# Patient Record
Sex: Female | Born: 1945 | Race: Black or African American | Hispanic: No | Marital: Single | Smoking: Never smoker
Health system: Southern US, Community
[De-identification: ages and names within clinical notes are randomized; demographics above are authoritative.]

## PROBLEM LIST (undated history)

## (undated) DIAGNOSIS — J449 Chronic obstructive pulmonary disease, unspecified: Secondary | ICD-10-CM

## (undated) DIAGNOSIS — B20 Human immunodeficiency virus [HIV] disease: Secondary | ICD-10-CM

## (undated) DIAGNOSIS — N3000 Acute cystitis without hematuria: Secondary | ICD-10-CM

## (undated) DIAGNOSIS — C801 Malignant (primary) neoplasm, unspecified: Secondary | ICD-10-CM

## (undated) DIAGNOSIS — E785 Hyperlipidemia, unspecified: Secondary | ICD-10-CM

## (undated) DIAGNOSIS — D735 Infarction of spleen: Secondary | ICD-10-CM

## (undated) DIAGNOSIS — Z9889 Other specified postprocedural states: Secondary | ICD-10-CM

## (undated) DIAGNOSIS — N289 Disorder of kidney and ureter, unspecified: Secondary | ICD-10-CM

## (undated) DIAGNOSIS — I639 Cerebral infarction, unspecified: Secondary | ICD-10-CM

## (undated) DIAGNOSIS — R87610 Atypical squamous cells of undetermined significance on cytologic smear of cervix (ASC-US): Secondary | ICD-10-CM

## (undated) DIAGNOSIS — Z923 Personal history of irradiation: Secondary | ICD-10-CM

## (undated) DIAGNOSIS — C3490 Malignant neoplasm of unspecified part of unspecified bronchus or lung: Secondary | ICD-10-CM

## (undated) DIAGNOSIS — B1910 Unspecified viral hepatitis B without hepatic coma: Secondary | ICD-10-CM

## (undated) DIAGNOSIS — G709 Myoneural disorder, unspecified: Secondary | ICD-10-CM

## (undated) DIAGNOSIS — J45909 Unspecified asthma, uncomplicated: Secondary | ICD-10-CM

## (undated) HISTORY — PX: CRANIOTOMY: SHX93

## (undated) HISTORY — PX: BRAIN SURGERY: SHX531

## (undated) HISTORY — PX: ABCESS DRAINAGE: SHX399

## (undated) HISTORY — DX: Atypical squamous cells of undetermined significance on cytologic smear of cervix (ASC-US): R87.610

## (undated) HISTORY — PX: OTHER SURGICAL HISTORY: SHX169

## (undated) HISTORY — DX: Malignant neoplasm of unspecified part of unspecified bronchus or lung: C34.90

---

## 2003-02-08 ENCOUNTER — Encounter: Admission: RE | Admit: 2003-02-08 | Discharge: 2003-02-08 | Payer: Self-pay | Admitting: Infectious Diseases

## 2003-02-08 ENCOUNTER — Ambulatory Visit (HOSPITAL_COMMUNITY): Admission: RE | Admit: 2003-02-08 | Discharge: 2003-02-08 | Payer: Self-pay | Admitting: Infectious Diseases

## 2003-02-08 ENCOUNTER — Encounter: Payer: Self-pay | Admitting: Infectious Diseases

## 2003-03-02 ENCOUNTER — Encounter: Admission: RE | Admit: 2003-03-02 | Discharge: 2003-03-02 | Payer: Self-pay | Admitting: Infectious Diseases

## 2003-06-25 ENCOUNTER — Emergency Department (HOSPITAL_COMMUNITY): Admission: EM | Admit: 2003-06-25 | Discharge: 2003-06-25 | Payer: Self-pay | Admitting: Emergency Medicine

## 2004-06-26 ENCOUNTER — Emergency Department (HOSPITAL_COMMUNITY): Admission: EM | Admit: 2004-06-26 | Discharge: 2004-06-26 | Payer: Self-pay | Admitting: Family Medicine

## 2004-07-16 ENCOUNTER — Ambulatory Visit: Payer: Self-pay | Admitting: Internal Medicine

## 2004-11-25 ENCOUNTER — Ambulatory Visit: Payer: Self-pay | Admitting: Obstetrics & Gynecology

## 2004-11-25 ENCOUNTER — Other Ambulatory Visit: Admission: RE | Admit: 2004-11-25 | Discharge: 2004-11-25 | Payer: Self-pay | Admitting: Obstetrics & Gynecology

## 2004-12-22 ENCOUNTER — Ambulatory Visit: Payer: Self-pay | Admitting: Internal Medicine

## 2005-02-10 ENCOUNTER — Ambulatory Visit (HOSPITAL_COMMUNITY): Admission: RE | Admit: 2005-02-10 | Discharge: 2005-02-10 | Payer: Self-pay | Admitting: Internal Medicine

## 2005-03-17 ENCOUNTER — Other Ambulatory Visit: Admission: RE | Admit: 2005-03-17 | Discharge: 2005-03-17 | Payer: Self-pay | Admitting: Gynecology

## 2005-03-17 ENCOUNTER — Encounter (INDEPENDENT_AMBULATORY_CARE_PROVIDER_SITE_OTHER): Payer: Self-pay | Admitting: Specialist

## 2005-03-17 ENCOUNTER — Ambulatory Visit: Admission: RE | Admit: 2005-03-17 | Discharge: 2005-03-17 | Payer: Self-pay | Admitting: Gynecologic Oncology

## 2005-03-18 ENCOUNTER — Ambulatory Visit: Payer: Self-pay | Admitting: Internal Medicine

## 2005-04-15 ENCOUNTER — Ambulatory Visit (HOSPITAL_COMMUNITY): Admission: RE | Admit: 2005-04-15 | Discharge: 2005-04-15 | Payer: Self-pay | Admitting: Gynecologic Oncology

## 2005-04-15 ENCOUNTER — Encounter (INDEPENDENT_AMBULATORY_CARE_PROVIDER_SITE_OTHER): Payer: Self-pay | Admitting: *Deleted

## 2005-04-21 ENCOUNTER — Ambulatory Visit (HOSPITAL_COMMUNITY): Admission: RE | Admit: 2005-04-21 | Discharge: 2005-04-21 | Payer: Self-pay | Admitting: General Surgery

## 2005-05-07 ENCOUNTER — Encounter (INDEPENDENT_AMBULATORY_CARE_PROVIDER_SITE_OTHER): Payer: Self-pay | Admitting: *Deleted

## 2005-05-07 ENCOUNTER — Ambulatory Visit (HOSPITAL_COMMUNITY): Admission: RE | Admit: 2005-05-07 | Discharge: 2005-05-07 | Payer: Self-pay | Admitting: General Surgery

## 2005-05-29 ENCOUNTER — Ambulatory Visit: Payer: Self-pay | Admitting: Internal Medicine

## 2005-06-09 ENCOUNTER — Other Ambulatory Visit: Admission: RE | Admit: 2005-06-09 | Discharge: 2005-06-09 | Payer: Self-pay | Admitting: Gynecologic Oncology

## 2005-06-09 ENCOUNTER — Ambulatory Visit: Admission: RE | Admit: 2005-06-09 | Discharge: 2005-06-09 | Payer: Self-pay | Admitting: Gynecologic Oncology

## 2005-06-09 ENCOUNTER — Encounter (INDEPENDENT_AMBULATORY_CARE_PROVIDER_SITE_OTHER): Payer: Self-pay | Admitting: Specialist

## 2005-06-10 ENCOUNTER — Ambulatory Visit: Payer: Self-pay | Admitting: Internal Medicine

## 2005-06-11 ENCOUNTER — Ambulatory Visit: Payer: Self-pay | Admitting: Internal Medicine

## 2005-07-30 ENCOUNTER — Ambulatory Visit: Payer: Self-pay | Admitting: Family Medicine

## 2005-09-09 ENCOUNTER — Ambulatory Visit (HOSPITAL_COMMUNITY): Admission: RE | Admit: 2005-09-09 | Discharge: 2005-09-09 | Payer: Self-pay | Admitting: Internal Medicine

## 2006-01-21 ENCOUNTER — Ambulatory Visit: Payer: Self-pay | Admitting: Internal Medicine

## 2006-01-27 ENCOUNTER — Encounter: Admission: RE | Admit: 2006-01-27 | Discharge: 2006-01-27 | Payer: Self-pay | Admitting: Internal Medicine

## 2006-02-25 ENCOUNTER — Ambulatory Visit: Payer: Self-pay | Admitting: Internal Medicine

## 2006-05-05 ENCOUNTER — Ambulatory Visit: Payer: Self-pay | Admitting: Nurse Practitioner

## 2006-06-16 ENCOUNTER — Emergency Department (HOSPITAL_COMMUNITY): Admission: EM | Admit: 2006-06-16 | Discharge: 2006-06-16 | Payer: Self-pay | Admitting: Emergency Medicine

## 2006-09-23 ENCOUNTER — Ambulatory Visit: Payer: Self-pay | Admitting: Internal Medicine

## 2006-10-01 ENCOUNTER — Ambulatory Visit (HOSPITAL_COMMUNITY): Admission: RE | Admit: 2006-10-01 | Discharge: 2006-10-01 | Payer: Self-pay | Admitting: Internal Medicine

## 2006-12-09 DIAGNOSIS — Z21 Asymptomatic human immunodeficiency virus [HIV] infection status: Secondary | ICD-10-CM | POA: Insufficient documentation

## 2006-12-09 DIAGNOSIS — F3289 Other specified depressive episodes: Secondary | ICD-10-CM | POA: Insufficient documentation

## 2006-12-09 DIAGNOSIS — B20 Human immunodeficiency virus [HIV] disease: Secondary | ICD-10-CM | POA: Insufficient documentation

## 2006-12-09 DIAGNOSIS — N63 Unspecified lump in unspecified breast: Secondary | ICD-10-CM | POA: Insufficient documentation

## 2006-12-09 DIAGNOSIS — M810 Age-related osteoporosis without current pathological fracture: Secondary | ICD-10-CM | POA: Insufficient documentation

## 2006-12-09 DIAGNOSIS — F329 Major depressive disorder, single episode, unspecified: Secondary | ICD-10-CM | POA: Insufficient documentation

## 2007-02-14 ENCOUNTER — Encounter: Payer: Self-pay | Admitting: Infectious Diseases

## 2007-02-14 ENCOUNTER — Ambulatory Visit: Payer: Self-pay | Admitting: Internal Medicine

## 2007-02-14 LAB — CONVERTED CEMR LAB
ALT: 13 units/L (ref 0–35)
AST: 19 units/L (ref 0–37)
Albumin: 4.5 g/dL (ref 3.5–5.2)
Alkaline Phosphatase: 84 units/L (ref 39–117)
BUN: 7 mg/dL (ref 6–23)
Basophils Absolute: 0 10*3/uL (ref 0.0–0.1)
Basophils Relative: 0 % (ref 0–1)
CO2: 25 meq/L (ref 19–32)
Calcium: 8.2 mg/dL — ABNORMAL LOW (ref 8.4–10.5)
Chloride: 104 meq/L (ref 96–112)
Creatinine, Ser: 1.11 mg/dL (ref 0.40–1.20)
Eosinophils Absolute: 0.1 10*3/uL (ref 0.0–0.7)
Eosinophils Relative: 1 % (ref 0–5)
Glucose, Bld: 97 mg/dL (ref 70–99)
HCT: 44.6 % (ref 36.0–46.0)
HIV 1 RNA Quant: <50 copies/mL (ref <50)
HIV-1 RNA Quant, Log: <1.7 (ref <1.70)
Hemoglobin: 13.9 g/dL (ref 12.0–15.0)
Lymphocytes Relative: 32 % (ref 12–46)
Lymphs Abs: 2.7 10*3/uL (ref 0.7–3.3)
MCHC: 31.2 g/dL (ref 30.0–36.0)
MCV: 91.6 fL (ref 78.0–100.0)
Monocytes Absolute: 0.3 10*3/uL (ref 0.2–0.7)
Monocytes Relative: 3 % (ref 3–11)
Neutro Abs: 5.4 10*3/uL (ref 1.7–7.7)
Neutrophils Relative %: 64 % (ref 43–77)
Platelets: 222 10*3/uL (ref 150–400)
Potassium: 4 meq/L (ref 3.5–5.3)
RBC: 4.87 M/uL (ref 3.87–5.11)
RDW: 15 % — ABNORMAL HIGH (ref 11.5–14.0)
Sodium: 142 meq/L (ref 135–145)
Total Bilirubin: 0.3 mg/dL (ref 0.3–1.2)
Total Protein: 8.6 g/dL — ABNORMAL HIGH (ref 6.0–8.3)
WBC: 8.4 10*3/uL (ref 4.0–10.5)

## 2007-05-23 ENCOUNTER — Emergency Department (HOSPITAL_COMMUNITY): Admission: EM | Admit: 2007-05-23 | Discharge: 2007-05-23 | Payer: Self-pay | Admitting: Emergency Medicine

## 2007-08-22 ENCOUNTER — Ambulatory Visit: Payer: Self-pay | Admitting: Internal Medicine

## 2007-08-22 LAB — CONVERTED CEMR LAB
ALT: 36 units/L — ABNORMAL HIGH (ref 0–35)
AST: 38 units/L — ABNORMAL HIGH (ref 0–37)
Absolute CD4: 990 #/uL (ref 381–1469)
Albumin: 4.1 g/dL (ref 3.5–5.2)
Alkaline Phosphatase: 82 units/L (ref 39–117)
BUN: 7 mg/dL (ref 6–23)
Basophils Absolute: 0 10*3/uL (ref 0.0–0.1)
Basophils Relative: 0 % (ref 0–1)
CD4 T Helper %: 39 % (ref 32–62)
CO2: 24 meq/L (ref 19–32)
Calcium: 9 mg/dL (ref 8.4–10.5)
Chloride: 107 meq/L (ref 96–112)
Creatinine, Ser: 0.94 mg/dL (ref 0.40–1.20)
Eosinophils Absolute: 0.1 10*3/uL (ref 0.0–0.7)
Eosinophils Relative: 1 % (ref 0–5)
Glucose, Bld: 86 mg/dL (ref 70–99)
HCT: 43.8 % (ref 36.0–46.0)
HIV 1 RNA Quant: <50 copies/mL (ref <50)
HIV-1 RNA Quant, Log: <1.7 (ref <1.70)
Hemoglobin: 13.5 g/dL (ref 12.0–15.0)
Lymphocytes Relative: 27 % (ref 12–46)
Lymphs Abs: 2.6 10*3/uL (ref 0.7–4.0)
MCHC: 30.8 g/dL (ref 30.0–36.0)
MCV: 91.1 fL (ref 78.0–100.0)
Monocytes Absolute: 0.4 10*3/uL (ref 0.1–1.0)
Monocytes Relative: 5 % (ref 3–12)
Neutro Abs: 6.3 10*3/uL (ref 1.7–7.7)
Neutrophils Relative %: 67 % (ref 43–77)
Platelets: 206 10*3/uL (ref 150–400)
Potassium: 4.3 meq/L (ref 3.5–5.3)
RBC: 4.81 M/uL (ref 3.87–5.11)
RDW: 16.3 % — ABNORMAL HIGH (ref 11.5–15.5)
Sodium: 139 meq/L (ref 135–145)
Total Bilirubin: 0.4 mg/dL (ref 0.3–1.2)
Total Lymphocytes %: 27 % (ref 12–46)
Total Protein: 8.3 g/dL (ref 6.0–8.3)
Total lymphocyte count: 2538 cells/mcL (ref 700–3300)
WBC, lymph enumeration: 9.4 10*3/uL (ref 4.0–10.5)
WBC: 9.4 10*3/uL (ref 4.0–10.5)

## 2007-09-22 ENCOUNTER — Ambulatory Visit: Payer: Self-pay | Admitting: Internal Medicine

## 2007-09-28 ENCOUNTER — Ambulatory Visit (HOSPITAL_COMMUNITY): Admission: RE | Admit: 2007-09-28 | Discharge: 2007-09-28 | Payer: Self-pay | Admitting: Internal Medicine

## 2009-04-26 ENCOUNTER — Emergency Department (HOSPITAL_COMMUNITY): Admission: EM | Admit: 2009-04-26 | Discharge: 2009-04-26 | Payer: Self-pay | Admitting: Family Medicine

## 2010-06-10 ENCOUNTER — Encounter: Payer: Self-pay | Admitting: Internal Medicine

## 2010-06-16 ENCOUNTER — Ambulatory Visit: Payer: Self-pay | Admitting: Internal Medicine

## 2010-06-16 ENCOUNTER — Encounter: Payer: Self-pay | Admitting: Internal Medicine

## 2010-06-16 LAB — CONVERTED CEMR LAB
ALT: 29 units/L (ref 0–35)
AST: 29 units/L (ref 0–37)
Albumin: 4.2 g/dL (ref 3.5–5.2)
Alkaline Phosphatase: 67 units/L (ref 39–117)
BUN: 11 mg/dL (ref 6–23)
Basophils Absolute: 0 10*3/uL (ref 0.0–0.1)
Basophils Relative: 0 % (ref 0–1)
CO2: 23 meq/L (ref 19–32)
Calcium: 8.9 mg/dL (ref 8.4–10.5)
Chloride: 107 meq/L (ref 96–112)
Creatinine, Ser: 1.16 mg/dL (ref 0.40–1.20)
Eosinophils Absolute: 0.1 10*3/uL (ref 0.0–0.7)
Eosinophils Relative: 1 % (ref 0–5)
Glucose, Bld: 100 mg/dL — ABNORMAL HIGH (ref 70–99)
HCT: 43.7 % (ref 36.0–46.0)
HIV 1 RNA Quant: 1220 copies/mL — ABNORMAL HIGH (ref <20)
HIV-1 RNA Quant, Log: 3.09 — ABNORMAL HIGH (ref <1.30)
Hemoglobin: 13.5 g/dL (ref 12.0–15.0)
Lymphocytes Relative: 41 % (ref 12–46)
Lymphs Abs: 2.3 10*3/uL (ref 0.7–4.0)
MCHC: 30.9 g/dL (ref 30.0–36.0)
MCV: 91.4 fL (ref 78.0–100.0)
Monocytes Absolute: 0.2 10*3/uL (ref 0.1–1.0)
Monocytes Relative: 4 % (ref 3–12)
Neutro Abs: 3 10*3/uL (ref 1.7–7.7)
Neutrophils Relative %: 54 % (ref 43–77)
Platelets: 211 10*3/uL (ref 150–400)
Potassium: 3.8 meq/L (ref 3.5–5.3)
RBC: 4.78 M/uL (ref 3.87–5.11)
RDW: 15.6 % — ABNORMAL HIGH (ref 11.5–15.5)
Sodium: 142 meq/L (ref 135–145)
Total Bilirubin: 0.3 mg/dL (ref 0.3–1.2)
Total Protein: 7.9 g/dL (ref 6.0–8.3)
WBC: 5.6 10*3/uL (ref 4.0–10.5)

## 2010-06-18 ENCOUNTER — Emergency Department (HOSPITAL_COMMUNITY)
Admission: EM | Admit: 2010-06-18 | Discharge: 2010-06-18 | Payer: Self-pay | Source: Home / Self Care | Admitting: Family Medicine

## 2010-07-03 ENCOUNTER — Ambulatory Visit
Admission: RE | Admit: 2010-07-03 | Discharge: 2010-07-03 | Payer: Self-pay | Source: Home / Self Care | Attending: Internal Medicine | Admitting: Internal Medicine

## 2010-07-03 ENCOUNTER — Encounter: Payer: Self-pay | Admitting: Internal Medicine

## 2010-07-03 DIAGNOSIS — J301 Allergic rhinitis due to pollen: Secondary | ICD-10-CM | POA: Insufficient documentation

## 2010-07-03 DIAGNOSIS — J449 Chronic obstructive pulmonary disease, unspecified: Secondary | ICD-10-CM | POA: Insufficient documentation

## 2010-07-03 LAB — CONVERTED CEMR LAB

## 2010-07-04 ENCOUNTER — Encounter: Payer: Self-pay | Admitting: Infectious Diseases

## 2010-07-07 ENCOUNTER — Encounter: Payer: Self-pay | Admitting: Internal Medicine

## 2010-07-14 ENCOUNTER — Encounter: Payer: Self-pay | Admitting: Infectious Diseases

## 2010-07-19 ENCOUNTER — Encounter (HOSPITAL_BASED_OUTPATIENT_CLINIC_OR_DEPARTMENT_OTHER): Payer: Self-pay | Admitting: General Surgery

## 2010-07-20 ENCOUNTER — Encounter: Payer: Self-pay | Admitting: *Deleted

## 2010-07-20 ENCOUNTER — Encounter: Payer: Self-pay | Admitting: Internal Medicine

## 2010-07-24 ENCOUNTER — Ambulatory Visit: Admit: 2010-07-24 | Payer: Self-pay | Admitting: Internal Medicine

## 2010-07-28 ENCOUNTER — Encounter (INDEPENDENT_AMBULATORY_CARE_PROVIDER_SITE_OTHER): Payer: Self-pay | Admitting: *Deleted

## 2010-07-31 NOTE — Miscellaneous (Signed)
  Clinical Lists Changes  Observations: Added new observation of TB PPDRESULT: negative (07/07/2010 16:19) Added new observation of PPD RESULT: < 5mm (07/07/2010 16:19) Added new observation of TB-PPD RDDTE: 07/06/2010 (07/07/2010 16:19)      PPD Results    Date of reading: 07/06/2010    Results: < 5mm    Interpretation: negative

## 2010-07-31 NOTE — Assessment & Plan Note (Signed)
Summary: F/U OV/VS   CC:  pt. to reestablish, lab results, and c/o cough.  History of Present Illness: patient recently moved back to [place] from [place] where she had been receiving her care for HIV.  She has been taking her a triple a but had missed 3 months of it while she was in [place].  The doctor she saw [place] said she may have developed some resistance because her Filbert Schilder was no longer undetectable.  She is unaware of a genotype being performed in [place].  She needs refills on her albuterol solution for her med nebulizer as well as her Advair.  She was diagnosed with COPD while she was in [place].  She is now off her methadone. She complains of postnasal drip.  She is otherwise doing well.  Depression History:      The patient denies a depressed mood most of the day and a diminished interest in her usual daily activities.        The patient denies that she feels like life is not worth living, denies that she wishes that she were dead, and denies that she has thought about ending her life.        Preventive Screening-Counseling & Management  Alcohol-Tobacco     Alcohol drinks/day: 0     Smoking Status: current     Packs/Day: 1.0  Caffeine-Diet-Exercise     Caffeine use/day: coffee, soda 3 per day     Does Patient Exercise: no  Safety-Violence-Falls     Seat Belt Use: yes      Drug Use:  former, marijuana, heroin, and cocaine.    Comments: pt. declined condoms   Updated Prior Medication List: ATRIPLA 600-200-300 MG TABS (EFAVIRENZ-EMTRICITAB-TENOFOVIR) Take 1 tablet by mouth once a day IBUPROFEN 800 MG  TABS (IBUPROFEN) Take 1 tablet by mouth three times a day NEURONTIN 300 MG CAPS (GABAPENTIN) pt. unsure of dose,  takes one three times a day CLARITIN 10 MG TABS (LORATADINE) Take 1 tablet by mouth once a day PROAIR HFA 108 (90 BASE) MCG/ACT AERS (ALBUTEROL SULFATE) one puff q 6 hours as needed ALBUTEROL SULFATE (2.5 MG/3ML) 0.083%  NEBU (ALBUTEROL SULFATE) use as directed ADVAIR DISKUS 250-50 MCG/DOSE AEPB (FLUTICASONE-SALMETEROL) one puff two times a day  Current Allergies (reviewed today): No known allergies  Past History:  Past Medical History: Last updated: 12/09/2006 Depression HIV disease Osteoporosis  Social History: Drug Use:  former, heroin, cocaine, marijuana  Review of Systems  The patient denies anorexia, fever, and weight loss.    Vital Signs:  Patient profile:   65 year old female Height:      69 inches (175.26 cm) Weight:      222.4 pounds (101.09 kg) BMI:     32.96 Temp:     98.4 degrees F (36.89 degrees C) oral Pulse rate:   97 / minute BP sitting:   149 / 83  (right arm)  Vitals Entered By: Wendall Mola CMA Duncan Dull) (July 03, 2010 1:57 PM) CC: pt. to reestablish, lab results, c/o cough Is Patient Diabetic? No Pain Assessment Patient in pain? no      Nutritional Status BMI of > 30 = obese Nutritional Status Detail appetite "good"  Have you ever been in a relationship where you felt threatened, hurt or afraid?No   Does patient need assistance? Functional Status Self care Ambulation Normal Comments pt. has been back on Atripla for five weeks, prior to that off for three months  Pt. states she takes Effexor, unsure of dose   Physical Exam  General:  alert, well-developed, well-nourished, and well-hydrated.   Head:  normocephalic and atraumatic.   Mouth:  pharynx pink and moist.  no thrush  Lungs:  normal breath sounds.     Impression & Recommendations:  Problem # 1:  HIV DISEASE (ICD-042) Pt to continue Atripla for now.  Will add a genotype to the labs already drawn to see if she has developed any resistance.  She will return in 3 weeks for results. Orders: T-HIV Genotype [phone]) New Patient Level III [phone])  Diagnostics Reviewed:  CD4: 850 (06/17/2010)   WBC: 5.6 (06/16/2010)   Hgb: 13.5 (06/16/2010)   HCT: 43.7 (06/16/2010)   Platelets: 211  (06/16/2010) HIV-1 RNA: 1220 (06/16/2010)     Problem # 2:  ALLERGIC RHINITIS, SEASONAL (ICD-477.0) claritin trial  Problem # 3:  COPD (ICD-496) refill meds Her updated medication list for this problem includes:    Proair Hfa 108 (90 Base) Mcg/act Aers (Albuterol sulfate) ..... One puff q 6 hours as needed    Albuterol Sulfate (2.5 Mg/61ml) 0.083% Nebu (Albuterol sulfate) ..... Use as directed    Advair Diskus 250-50 Mcg/dose Aepb (Fluticasone-salmeterol) ..... One puff two times a day  Medications Added to Medication List This Visit: 1)  Neurontin 300 Mg Caps (Gabapentin) .... Pt. unsure of dose,  takes one three times a day 2)  Zyrtec Allergy 10 Mg Tabs (Cetirizine hcl) .... Take one tablet one time a day 3)  Claritin 10 Mg Tabs (Loratadine) .... Take 1 tablet by mouth once a day 4)  Proair Hfa 108 (90 Base) Mcg/act Aers (Albuterol sulfate) .... One puff q 6 hours as needed 5)  Albuterol Sulfate (2.5 Mg/62ml) 0.083% Nebu (Albuterol sulfate) .... Use as directed 6)  Advair Diskus 250-50 Mcg/dose Aepb (Fluticasone-salmeterol) .... One puff two times a day  Other Orders: Pneumococcal Vaccine (73220) Admin 1st Vaccine (25427) TB Skin Test (06237) Admin of Any Addtl Vaccine (62831)  Patient Instructions: 1)  Please schedule a follow-up appointment in 3 weeks. Prescriptions: ADVAIR DISKUS 250-50 MCG/DOSE AEPB (FLUTICASONE-SALMETEROL) one puff two times a day  #1 x 5   Entered and Authorized by:   Yisroel Ramming MD   Signed by:   Yisroel Ramming MD on 07/03/2010   Method used:   Print then Give to Patient   RxID:   [id number] ALBUTEROL SULFATE (2.5 MG/3ML) 0.083% NEBU (ALBUTEROL SULFATE) use as directed  #30 vials x 5   Entered and Authorized by:   Yisroel Ramming MD   Signed by:   Yisroel Ramming MD on 07/03/2010   Method used:   Print then Give to Patient   RxID:   [id number] CLARITIN 10 MG TABS (LORATADINE) Take 1 tablet by mouth once a day  #30 x 5   Entered and Authorized  by:   Yisroel Ramming MD   Signed by:   Yisroel Ramming MD on 07/03/2010   Method used:   Print then Give to Patient   RxID:   [id number]   RXs called to [facility] Wendall Mola CMA ( AAMA)  July 03, 2010 4:13 PM   Immunization History:  Influenza Immunization History:    Influenza:  historical (05/07/2010)  Immunizations Administered:  Pneumonia Vaccine:    Vaccine Type: Pneumovax    Site: right deltoid    Mfr: Merck    Dose: 0.5 ml    Route: IM    Given by: Wendall Mola  CMA ( AAMA)    Exp. Date: 11/06/2011    Lot #: [id number]    VIS given: 06/03/09 version given July 03, 2010.  PPD Skin Test:    Vaccine Type: PPD    Site: right forearm    Mfr: Sanofi Pasteur    Dose: 0.1 ml    Route: ID    Given by: Wendall Mola CMA ( AAMA)    Exp. Date: 01/03/2012    Lot #: [id number]

## 2010-07-31 NOTE — Miscellaneous (Signed)
Summary: [facility]   Imported By: Florinda Marker 07/21/2010 15:35:45  _____________________________________________________________________  External Attachment:    Type:   Image     Comment:   External Document

## 2010-07-31 NOTE — Miscellaneous (Signed)
  Clinical Lists Changes  Orders: Added new Test order of T-CBC w/Diff [phone]) - Signed Added new Test order of T-CD4SP [facility]) (CD4SP) - Signed Added new Test order of T-Comprehensive Metabolic Panel [phone]) - Signed Added new Test order of T-HIV Viral Load [phone]) - Signed     Process Orders Check Orders Results:     [facility]: ABN not required for this insurance Tests Sent for requisitioning (June 10, 2010 3:01 PM):     06/10/2010: [facility] -- T-CBC w/Diff [[id number]] (signed)     06/10/2010: [facility] -- T-Comprehensive Metabolic Panel [[id number]] (signed)     06/10/2010: [facility] -- T-HIV Viral Load [phone] (signed)

## 2010-08-06 ENCOUNTER — Encounter (INDEPENDENT_AMBULATORY_CARE_PROVIDER_SITE_OTHER): Payer: Self-pay | Admitting: *Deleted

## 2010-08-06 NOTE — Miscellaneous (Signed)
  Clinical Lists Changes  Observations: Added new observation of INCOMESOURCE: UNKNOWN (07/28/2010 16:05) Added new observation of HOUSEINCOME: 0  (07/28/2010 16:05) Added new observation of #CHILD<18 IN: Unknown  (07/28/2010 16:05) Added new observation of FAMILYSIZE: 0  (07/28/2010 16:05) Added new observation of HOUSING: Unknown  (07/28/2010 16:05) Added new observation of YEARLYEXPEN: 0  (07/28/2010 16:05) Added new observation of GENDER: Unknown  (07/28/2010 16:05) Added new observation of MARITAL STAT: Unknown  (07/28/2010 16:05) Added new observation of LATINO/HISP: Unknown  (07/28/2010 16:05) Added new observation of RACE: Unknown  (07/28/2010 16:05) Added new observation of PATNTCOUNTY: [place]  (07/28/2010 16:05)

## 2010-08-07 ENCOUNTER — Telehealth (INDEPENDENT_AMBULATORY_CARE_PROVIDER_SITE_OTHER): Payer: Self-pay | Admitting: *Deleted

## 2010-08-07 ENCOUNTER — Encounter: Payer: Self-pay | Admitting: Adult Health

## 2010-08-07 ENCOUNTER — Ambulatory Visit (INDEPENDENT_AMBULATORY_CARE_PROVIDER_SITE_OTHER): Payer: Medicaid Other | Admitting: Adult Health

## 2010-08-07 DIAGNOSIS — J301 Allergic rhinitis due to pollen: Secondary | ICD-10-CM

## 2010-08-07 DIAGNOSIS — J449 Chronic obstructive pulmonary disease, unspecified: Secondary | ICD-10-CM

## 2010-08-07 DIAGNOSIS — B20 Human immunodeficiency virus [HIV] disease: Secondary | ICD-10-CM

## 2010-08-07 LAB — CONVERTED CEMR LAB
HIV 1 RNA Quant: 465 copies/mL — ABNORMAL HIGH (ref <20)
HIV-1 RNA Quant, Log: 2.67 — ABNORMAL HIGH (ref <1.30)

## 2010-08-12 ENCOUNTER — Ambulatory Visit (HOSPITAL_COMMUNITY): Payer: Medicaid Other | Attending: Infectious Diseases

## 2010-08-14 NOTE — Progress Notes (Signed)
  Phone Note Outgoing Call   Call placed by: Alesia Morin CMA,  August 07, 2010 4:24 PM Call placed to: Patient Summary of Call: Pt called to give PFT appt. She was told appt is Tuesday, February 14 at [facility] at 1pm and she needs to arrive at 1245 and register at 1st floor admitting. She understood directions and was ok with appt as given. Alesia Morin CMA  August 07, 2010 4:26 PM

## 2010-08-14 NOTE — Miscellaneous (Signed)
  Clinical Lists Changes 

## 2010-08-19 ENCOUNTER — Telehealth (INDEPENDENT_AMBULATORY_CARE_PROVIDER_SITE_OTHER): Payer: Self-pay | Admitting: *Deleted

## 2010-08-26 ENCOUNTER — Ambulatory Visit (HOSPITAL_COMMUNITY)
Admission: RE | Admit: 2010-08-26 | Discharge: 2010-08-26 | Disposition: A | Discharge status: Home / Self Care | Payer: Medicaid Other | Source: Ambulatory Visit | Attending: Infectious Diseases | Admitting: Infectious Diseases

## 2010-08-26 DIAGNOSIS — J4489 Other specified chronic obstructive pulmonary disease: Secondary | ICD-10-CM | POA: Insufficient documentation

## 2010-08-26 DIAGNOSIS — J449 Chronic obstructive pulmonary disease, unspecified: Secondary | ICD-10-CM | POA: Insufficient documentation

## 2010-08-26 NOTE — Progress Notes (Signed)
Summary: PFT appt. rescheduled  Phone Note Call from Patient   Caller: Patient Summary of Call: Pt. missed PFT appt. for 08/12/10.  Appt. rescheduled for 07/2810 at 1:00 pm. pt. notified Initial call taken by: Wendall Mola CMA Duncan Dull),  August 19, 2010 11:16 AM

## 2010-09-08 LAB — T-HELPER CELL (CD4) - (RCID CLINIC ONLY)
CD4 % Helper T Cell: 37 % (ref 33–55)
CD4 T Cell Abs: 850 uL (ref 400–2700)

## 2010-09-18 ENCOUNTER — Ambulatory Visit: Payer: Medicaid Other | Admitting: Adult Health

## 2010-09-19 ENCOUNTER — Ambulatory Visit: Payer: Medicaid Other | Admitting: Adult Health

## 2010-09-26 ENCOUNTER — Ambulatory Visit (INDEPENDENT_AMBULATORY_CARE_PROVIDER_SITE_OTHER): Payer: Medicaid Other | Admitting: Adult Health

## 2010-09-26 ENCOUNTER — Encounter: Payer: Self-pay | Admitting: Adult Health

## 2010-09-26 DIAGNOSIS — F3289 Other specified depressive episodes: Secondary | ICD-10-CM

## 2010-09-26 DIAGNOSIS — Z79899 Other long term (current) drug therapy: Secondary | ICD-10-CM

## 2010-09-26 DIAGNOSIS — F329 Major depressive disorder, single episode, unspecified: Secondary | ICD-10-CM

## 2010-09-26 DIAGNOSIS — Z21 Asymptomatic human immunodeficiency virus [HIV] infection status: Secondary | ICD-10-CM

## 2010-09-26 DIAGNOSIS — B20 Human immunodeficiency virus [HIV] disease: Secondary | ICD-10-CM

## 2010-09-26 DIAGNOSIS — J449 Chronic obstructive pulmonary disease, unspecified: Secondary | ICD-10-CM

## 2010-09-26 DIAGNOSIS — F32A Depression, unspecified: Secondary | ICD-10-CM

## 2010-09-26 MED ORDER — QUETIAPINE FUMARATE ER 150 MG PO TB24: 1.0000 | ORAL_TABLET | Freq: Every day | ORAL | Status: DC

## 2010-09-26 MED ORDER — FLUTICASONE-SALMETEROL 500-50 MCG/DOSE IN AEPB: 1.0000 | INHALATION_SPRAY | Freq: Two times a day (BID) | RESPIRATORY_TRACT | Status: DC

## 2010-09-26 NOTE — Progress Notes (Signed)
  Subjective:    Patient ID: [name], female    DOB: 11-12-1945, 65 y.o.   MRN: [id number]  HPIPresents to clinic to f/u PFT's and is requesting refill on Seroquel, although our records do not indicate she is taking medication.  She has been on med since August 2011 @ [facility].  Also, she has been using rescue inhaler bid with 1-2 nebulizer treatments QD along with Advair inhaler.   Review of Systems  Constitutional: Positive for fatigue.  HENT: Negative.   Eyes: Negative.   Respiratory: Positive for cough, chest tightness, shortness of breath and wheezing. Negative for apnea, choking and stridor.   Cardiovascular: Negative.   Gastrointestinal: Negative.   Genitourinary: Negative.   Musculoskeletal: Negative.   Skin: Negative.   Neurological: Negative.   Hematological: Negative.   Psychiatric/Behavioral: Negative.        Objective:   Physical Exam  Constitutional: She is oriented to person, place, and time. She appears well-developed and well-nourished.  HENT:  Head: Normocephalic and atraumatic.  Right Ear: External ear normal.  Left Ear: External ear normal.  Nose: Nose normal.  Mouth/Throat: Oropharynx is clear and moist.  Eyes: Conjunctivae and EOM are normal. Pupils are equal, round, and reactive to light.  Neck: Normal range of motion. Neck supple.  Cardiovascular: Normal rate, regular rhythm, normal heart sounds and intact distal pulses.   Pulmonary/Chest: Effort normal. No respiratory distress. She has wheezes. She has no rales. She exhibits no tenderness.  Abdominal: Soft. Bowel sounds are normal.  Musculoskeletal: Normal range of motion.  Neurological: She is alert and oriented to person, place, and time.  Skin: Skin is warm and dry.  Psychiatric: She has a normal mood and affect. Her behavior is normal. Judgment and thought content normal.          Assessment & Plan:  COPD:  PFT shows moderate severe airway restriction with decreased diffusion capacity.   Given the frequency of rescue and nebulizer treatments, we will increase Advair to 500/50 1 bid and monitor response.  DEPRESSION: Confirmed Rx with PPA, and renewed Seroquel 150 mg po qhs with 5 RF's  HIV:  Repeat staging labs with lipids in 4 weeks and RTC in 6 weeks.

## 2010-10-20 ENCOUNTER — Other Ambulatory Visit: Payer: Self-pay | Admitting: Licensed Clinical Social Worker

## 2010-10-20 DIAGNOSIS — M792 Neuralgia and neuritis, unspecified: Secondary | ICD-10-CM

## 2010-10-20 DIAGNOSIS — F329 Major depressive disorder, single episode, unspecified: Secondary | ICD-10-CM

## 2010-10-20 DIAGNOSIS — F32A Depression, unspecified: Secondary | ICD-10-CM

## 2010-10-20 MED ORDER — GABAPENTIN 300 MG PO CAPS: 300.0000 mg | ORAL_CAPSULE | Freq: Three times a day (TID) | ORAL | Status: DC

## 2010-10-20 MED ORDER — VENLAFAXINE HCL ER 150 MG PO CP24: 150.0000 mg | ORAL_CAPSULE | Freq: Every day | ORAL | Status: DC

## 2010-11-05 ENCOUNTER — Other Ambulatory Visit: Payer: Self-pay | Admitting: *Deleted

## 2010-11-05 DIAGNOSIS — J449 Chronic obstructive pulmonary disease, unspecified: Secondary | ICD-10-CM

## 2010-11-05 MED ORDER — ALBUTEROL SULFATE HFA 108 (90 BASE) MCG/ACT IN AERS: 1.0000 | INHALATION_SPRAY | Freq: Four times a day (QID) | RESPIRATORY_TRACT | Status: DC | PRN

## 2010-11-05 MED ORDER — MONTELUKAST SODIUM 10 MG PO TABS: 10.0000 mg | ORAL_TABLET | Freq: Every day | ORAL | Status: DC

## 2010-11-05 NOTE — Telephone Encounter (Signed)
Refill request came in from the pharmacy.

## 2010-11-11 NOTE — Procedures (Signed)
EEG NUMBER:  [id number].   A 65 year old woman with possible seizures with unresponsiveness and  sweating, hearing but not responding to voice, evaluated at [facility] with an outpatient study ordered by Dr. Philipp Deputy performed on  September 28, 2007.   MEDICATIONS LISTED:  1. Atripla for HIV.  2. Effexor.  3. Abilify.  4. Inhalers.  5. Nose drops.  6. Advair.  7. Pain medications.  (The patient does not have an accurate and complete list.)   This was a routine 17-channel EEG with one channel devoted to EKG  utilizing the International 10/20 lead placement system.  The patient  was described as being awake and asleep with some snoring noted  clinically; however, electrographically she appeared to be of chiefly  awake with some drowsiness.  While well awake and alert, the background  consisted of a fairly well-organized 9-10 Hz alpha activity which was  reasonably well developed and predominant in the posterior head regions.  During drowsiness, there was some disorganization with slowing and  decrease of the frequency and amplitude.  Some eye movement artifact was  also noted.  No definite interhemispheric asymmetry was identified, and  no definite epileptiform discharges were seen.  Activation procedures  included hyperventilation and photic stimulation.  These serve chiefly  to rouse the patient and did not produce any significant change in the  background.  The EKG monitor reveals a relatively regular rhythm with a  rate of 96 beats per minute.   CONCLUSION:  Essentially normal awake and drowsy EEG without seizure  activity or focal abnormality seen during the course of today's  recording.  Clinical correlation is recommended.      Catherine A. Orlin Hilding, M.D.  Electronically Signed     [id number]  D:  09/28/2007 17:07:08  T:  09/29/2007 09:04:58  Job #:  [id number]

## 2010-11-13 ENCOUNTER — Other Ambulatory Visit: Payer: Medicaid Other

## 2010-11-14 NOTE — Op Note (Signed)
NAME:  [name], [name]              ACCOUNT NO.:  [id number]   MEDICAL RECORD NO.:  [id number]          PATIENT TYPE:  AMB   LOCATION:  DAY                          FACILITY:  [facility]   PHYSICIAN:  John T. Kyla Balzarine, M.D.    DATE OF BIRTH:  1945-07-19   DATE OF PROCEDURE:  04/15/2005  DATE OF DISCHARGE:                                 OPERATIVE REPORT   SURGEON:  Ronita Hipps, MD.   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   POSTOPERATIVE DIAGNOSES:  Not given.   PROCEDURE:  LEEP biopsy of the cervix, colposcopy of the upper vagina and  treatment of VAIN with laser vaporization, laser vaporization of multiple  vulvar lesions.   ANESTHESIA:  General.   DESCRIPTION OF FINDINGS/INDICATIONS FOR SURGERY:  Examination of the upper  vagina and cervix revealed atrophic vagina with acetowhite epithelium in the  left upper fornix, compatible with low grade VAIN. The transformation zone  was unable to be visualized. Inspection of the external genitalia after  acetic acid staining revealed healing biopsy site in the posterior inner  left labia majora and faint acetowhite epithelium at the juncture of vaginal  mucosa and keratinized skin at the introitus. Additionally, in the mid  medial right labia majora was a patch of acetowhite epithelium. All areas of  acetowhite epithelium were treated with laser vaporization.   DESCRIPTION OF PROCEDURE:  The patient was prepped and draped in the  lithotomy position after induction of anesthesia. The bladder was  catheterized for in excess of 200 mL of urine sterilely. Examination under  anesthesia revealed the findings above. After performing colposcopy of the  upper vagina, the cervix was circumferentially injected with 10 mL of 0.5%  Xylocaine with epinephrine. LEEP biopsy of the entire exocervical os was  performed using  a medium loop and a blend setting at 60 watts. The base of  the LEEP crater was 3 kg electrodesiccated with the ball electrode.  Subsequently, the CO2 laser at a setting of 20 watts continuous was used to  vaporize the acetowhite vaginal epithelium in the left fornix.   The acetowhite epithelium of the vulva was infiltrated with 0.5% Marcaine  intradermally and then treated at a setting of 20 watts continuous with a  handpiece, to ablate the acetowhite lesions with a margin of normal-  appearing skin/mucosa of approximately 3-5 mm. Laser vaporization was taken  down to the level of the second surgical plane with shrinkage of collagen  fibers. The patient tolerated the procedure well and was returned to the  recovery room in excellent condition. Sponge and instrument counts correct.   DRAINS/PACKS/ETC.:  None.   PATHOLOGY:  Cervical LEEP biopsy specimen.      John T. Kyla Balzarine, M.D.  Electronically Signed     JTS/MEDQ  D:  04/15/2005  T:  04/16/2005  Job:  [id number]   cc:   Daine Floras, M.D.  Fax: [phone]   Telford Nab, R.N.  [address]  [place], [place] [address]   Lesly Dukes, M.D.  Fax: [phone]   Pablo Lawrence. Philipp Deputy, M.D.  Fax: [phone]

## 2010-11-14 NOTE — Consult Note (Signed)
NAME:  [name], [name] NO.:  [id number]   MEDICAL RECORD NO.:  [id number]          PATIENT TYPE:  OUT   LOCATION:  GYN                          FACILITY:  [facility]   PHYSICIAN:  Paola A. Duard Brady, MD    DATE OF BIRTH:  16-Nov-1945   DATE OF CONSULTATION:  06/09/2005  DATE OF DISCHARGE:                                   CONSULTATION   Ms. 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 She is also HIV  positive though her viral load is low and her last CD4 count was 500. She is  overall doing quite well, she underwent a LEEP and laser ablation of the  vulva April 15, 2005. Findings at the time of surgery included acetowhite  epithelium in the left upper fornix consistent with low grade VIN.  Acetowhite epithelial changes at the junction of the vaginal mucosa and  keratinized skin at the introitus and a patch of acetowhite epithelial  changes in the mid medial right labia majora. All areas of acetowhite  epithelium were treated with laser vaporization and she underwent a LEEP.  LEEP revealed high grade dysplasia, CIN-2 and low grade dysplasia CIN-1 with  negative margins. She comes in today for followup. She is overall doing  quite well and denies any significant complaints.   PAST MEDICAL HISTORY:  Significant for heparin B, C, HIV.   SOCIAL HISTORY:  She continues to smoke a pack and a half per day. She is on  methadone program. She has not used any cocaine.   MEDICATIONS:  Effexor, trazodone, Epivir, Neurontin, Sustiva, Viread,  ibuprofen and methadone.   PHYSICAL EXAMINATION:  VITAL SIGNS:  Weight 224 pounds which is up 6 pounds  from her last visit. Blood pressure 118/72, pulse 88, respirations 18.  GENERAL:  Well-nourished, well-developed, female in no acute distress.  PELVIC:  External genitalia is notable for skin color changed areas in the  area of the laser ablation. There is no  new hyperkeratotic raised lesions.  The vagina is well epithelialized. The cervix is visualized and is status  post LEEP. ThinPrep Pap was submitted without difficulty. Bimanual  examination reveals no masses or nodularity. The corpus is normal size,  shape and consistency. Exam is somewhat limited by habitus.   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   PLAN:  I will followup on the results of her Pap smear from today. She will  return to see [name] in four months.      Paola A. Duard Brady, MD  Electronically Signed     PAG/MEDQ  D:  06/09/2005  T:  06/10/2005  Job:  [id number]   cc:   Lesly Dukes, M.D.   Pablo Lawrence. Philipp Deputy, M.D.  Fax: [phone]   Telford Nab, R.N.  [address]  [place], [place] [address]

## 2010-11-14 NOTE — Op Note (Signed)
NAME:  [name], [name]              ACCOUNT NO.:  [id number]   MEDICAL RECORD NO.:  [id number]          PATIENT TYPE:  AMB   LOCATION:  SDS                          FACILITY:  [facility]   PHYSICIAN:  Leonie Man, M.D.   DATE OF BIRTH:  [birth year]   DATE OF PROCEDURE:  05/07/2005  DATE OF DISCHARGE:  05/07/2005                                 OPERATIVE REPORT   PREOPERATIVE DIAGNOSIS:  Hidradenitis, left axilla.   POSTOPERATIVE DIAGNOSIS:  Hidradenitis, left axilla.   PROCEDURE:  Excision, hidradenitis, left axilla.   SURGEON:  Leonie Man, M.D.   ASSISTANT:  OR tech.   ANESTHESIA:  General.   SPECIMENS TO LAB:  Axillary skin.   ESTIMATED BLOOD LOSS:  Minimal.   No complications, and the patient was returned to the PACU in good  condition.   The patient is a 65 year old female with diagnosed hepatitis C and HIV  status on multiple medications.  She developed hidradenitis of the left  axilla and comes to the operating room now for excision after the risks and  potential benefits of surgery have been discussed, all questions answered  and consent obtained.   PROCEDURE:  Following the induction of satisfactory general anesthesia, the  patient is positioned supinely and the left axilla is prepped and draped to  be included in the sterile operative field with the left arm extended  laterally.  The area of hidradenitis, which occupied the central portion of  the axilla, was encircled with an elliptical incision and deepened through  the skin and subcutaneous tissue, taking all of the axillary skin containing  hidradenitis superativa.  This was removed and forwarded for pathologic  evaluation.  Hemostasis obtained with electrocautery.  Subcutaneous tissues  closed with interrupted 3-0 Vicryl sutures.  Skin closed with running 4-0  Monocryl suture and then reinforced with Steri-Strips.  Sterile dressings  applied.  Anesthetic reversed.  The patient removed from the operating  room  to the recovery room in stable condition. She tolerated the procedure.      Leonie Man, M.D.  Electronically Signed     PB/MEDQ  D:  05/07/2005  T:  05/08/2005  Job:  [id number]

## 2010-11-14 NOTE — Group Therapy Note (Signed)
NAME:  [name], [name] NO.:  [id number]   MEDICAL RECORD NO.:  [id number]          PATIENT TYPE:  WOC   LOCATION:  [facility]                   FACILITY:  [facility]   PHYSICIAN:  Elsie Lincoln, MD      DATE OF BIRTH:  May 07, 1946   DATE OF SERVICE:  11/25/2004                                    CLINIC NOTE   The patient is a 65 year old female who was referred here from [facility] for follow-up of dysplasia.  Patient is in a study for the past 10  years to evaluate how people with HIV do compared to people without HIV.  She said she has had cervical and vaginal abnormalities for two years and  been doing multiple biopsies.  Her last biopsy was done March 2006.  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  They wanted to treat her  up there; however, she is now getting all of her care here so they deferred  treatment.  She states this is the only dysplasia she has had.  However, on  one of their notes it says she has had a LEEP.  Patient is not the best  historian.  Patient also has a new complaint of postmenopausal bleeding for  up to a month.  Today she presents for endometrial biopsy and addressment of  her cervical dysplasia.   PAST MEDICAL HISTORY:  1. HIV positive since 1991.  Does not know when she contracted it.  2. She is on methadone for previous opioid addiction.  3. She has osteoporosis.  4. She has a history of shingles.     MEDICATIONS:  Epivir, Viread, Sustiva, Effexor, and another medication that  I cannot read.   PAST SURGICAL HISTORY:  Five surgeries on her breasts for recurrent  abscesses.  Patient is under the process of her health form as she is trying  to recall the dates of these procedures.  Last mammogram was 2003.   ALLERGIES:  None.   FAMILY HISTORY:  Pancreatic cancer, throat cancer, and questionable lung  cancer.  No history of breast,  colon, cervical, endometrial, or ovarian  cancers.   PHYSICAL EXAMINATION:  VITAL SIGNS:  Temperature 96.6, pulse 95, blood  pressure 139/29, weight 204, height 5 feet 9 inches.  GENERAL:  Well-nourished, well-developed, no apparent distress.  ABDOMEN:  Soft, nontender, nondistended.  No rebound.  No guarding.  GENITALIA:  Tanner V.  On the left labia inferior portion there is an  interesting lesion; however, after soaked in acetic acid for five minutes it  is not aceto-white and appears to be seborrheic keratosis.  The vagina is  atrophic and well healed biopsy sites.  Cervix is atrophic.  Uterus sounds  to approximately 6 cm and minimal tissue was retrieved after two passes of  the endometrial biopsy.  Uterus difficult to palpate secondary to body  habitus, nontender.  Adnexa:  No masses, nontender.  However, she is  difficult to palpate secondary to body habitus.  EXTREMITIES:  There is a large mole on her left inner thigh.  Patient  states that she has been asked by several doctors to remove this but however  she has refused in the past.  Also, her upper extremities have multiple  papules that are scabbed over.  Patient believes that these were shingles;  however, they do not appear to be.  Her intertriginous areas are without  lesions so unlikely to be scabies.  Patient should go to her primary care  physician to address this issue.   ASSESSMENT/PLAN:  A 65 year old female with cervical and vaginal dysplasia  and HIV positive and postmenopausal bleeding.  1. Will refer to Serita Kyle, M.D. for evaluation of her cervix and      vagina.  I do not do CO2 laser which she would most likely need to      vaporize these areas.  2. Endometrial biopsy done today with above results.  If premalignant or      malignant cells, will address appropriately.  3. Follow up with primary care physician for plaques and papules along      upper extremities.  4. Mammogram.  5. Return to me in  three months to make sure that everything is being      addressed appropriately.        KL/MEDQ  D:  11/25/2004  T:  11/26/2004  Job:  [id number]

## 2010-11-14 NOTE — Consult Note (Signed)
NAME:  [name], [name] NO.:  [id number]   MEDICAL RECORD NO.:  [id number]          PATIENT TYPE:  OUT   LOCATION:  GYN                          FACILITY:  [facility]   PHYSICIAN:  Paola A. Duard Brady, MD    DATE OF BIRTH:  Jul 14, 1945   DATE OF CONSULTATION:  03/17/2005  DATE OF DISCHARGE:  03/17/2005                                   CONSULTATION   REFERRED BY:  Lesly Dukes, M.D.   The patient is seen today in consultation at the request of Dr. Penne Lash. Ms.  Gilkison is a 65 year old, gravida 0 who is HIV positive who has had cervical  and vaginal abnormalities for approximately 2 years and has had multiple  biopsies. She states she had biopsies done in March of 2006 which showed  VAIN 1 and a vaginal biopsy that too showed VAIN 2 as well as a cervical  biopsy that showed CIN 2. She was referred to [name] for this reason. She also  by Dr. Penne Lash had an endometrial biopsy secondary to history of  postmenopausal bleeding. The results of that I do not have. She otherwise  really has no gynecologic complaints. She does complain of some pain with  standing and sitting, restless leg syndrome for the past 3 months. She has  pain migrating to her arms and complains of being nervous. These are issues  that are followed by her primary care physician, Dr. Yisroel Ramming, at  [facility].   PAST MEDICAL HISTORY:  Significant for hep B/C, HIV for the past 10 yrs, Her  viral load is low. Her last CD4 count was 500. She has osteoarthritis.   SOCIAL HISTORY:  She smokes about a pack and a half per day. She has done so  for 46 years. She denies the use of alcohol. She is currently on a methadone  program but her last use of heroin was more than 20 years ago. She has not  used any cocaine in the past 6 months.   MEDICATIONS:  1.  Effexor 150 mg twice daily.  2.  Trazodone 100 mg daily.  3.  Epivir 300 mg daily.  4.  Neurontin 300 mg t.i.d.  5.  Sustiva 600 mg q.h.s.  6.  Viread 300  mg daily.  7.  Ibuprofen p.r.n.   PAST SURGICAL HISTORY:  Throat surgery in 1967, breast biopsy x4 for benign  abscess, back abscesses, questionable hydradenitis under her left arm.   FAMILY HISTORY:  Significant for diabetes in her sister. Her brother has  hepatitis. Her father had coronary disease.   ALLERGIES:  None.   PHYSICAL EXAMINATION:  VITAL SIGNS:  Height 5 foot 9, weight 218 pounds,  blood pressure 110/70, pulse 80, respirations 18.  GENERAL:  Well-nourished, well-developed female in no acute distress.  PELVIC:  External genitalia is notable for a hyperkeratotic raised lesion on  her left labia majora inferiorly which measures approximately a centimeter  by a centimeter, it is raised and hyperkeratotic. The vagina is atrophic.  The cervix is atrophic. There are no gross visible lesions. Bimanual  examination is limited  by habitus. After verbal consent, 1 mL of 1%  lidocaine was injected into the left labial lesion. A biopsy was performed  without difficulty, hemostasis obtained using silver nitrate.   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 She will most likely need the vaginal  and cervical dysplasia either addressed with excisional procedures or laser.  However, we needed to proceed with the vulvar biopsy prior to proceeding  with that to evaluate the extent of her needs. Will followup on the results  of her vaginal biopsy. We will contact her with the results and determine  her disposition pending them.      Paola A. Duard Brady, MD  Electronically Signed     PAG/MEDQ  D:  03/19/2005  T:  03/20/2005  Job:  [id number]   cc:   Lesly Dukes, M.D.   Pablo Lawrence. Philipp Deputy, M.D.  Fax: [phone]   Telford Nab, R.N.  [address]  [place], [place] [address]

## 2010-11-27 ENCOUNTER — Encounter: Payer: Self-pay | Admitting: Adult Health

## 2010-11-27 ENCOUNTER — Ambulatory Visit (INDEPENDENT_AMBULATORY_CARE_PROVIDER_SITE_OTHER): Payer: Medicaid Other | Admitting: Adult Health

## 2010-11-27 DIAGNOSIS — L02214 Cutaneous abscess of groin: Secondary | ICD-10-CM

## 2010-11-27 DIAGNOSIS — Z79899 Other long term (current) drug therapy: Secondary | ICD-10-CM

## 2010-11-27 DIAGNOSIS — J4489 Other specified chronic obstructive pulmonary disease: Secondary | ICD-10-CM

## 2010-11-27 DIAGNOSIS — J449 Chronic obstructive pulmonary disease, unspecified: Secondary | ICD-10-CM

## 2010-11-27 DIAGNOSIS — L02219 Cutaneous abscess of trunk, unspecified: Secondary | ICD-10-CM

## 2010-11-27 DIAGNOSIS — Z21 Asymptomatic human immunodeficiency virus [HIV] infection status: Secondary | ICD-10-CM

## 2010-11-27 DIAGNOSIS — B20 Human immunodeficiency virus [HIV] disease: Secondary | ICD-10-CM

## 2010-11-27 NOTE — Progress Notes (Signed)
  Subjective:    Patient ID: [name], female    DOB: 31-Oct-1945, 65 y.o.   MRN: [id number]  HPI  presents to clinic for scheduled followup however, she did not have any labs drawn when she was supposed to for evaluation. She stopped using her nebulizer machine at home for her COPD and increased the use of her MDI, albuterol, which has proven to be ineffective with her respiratory complaints. She does remain. Adherent to her HIV medications with minimal missed doses with good tolerance and no complications. Currently, she is complaining of chest tightness, and shortness of breath with wheezing and states this is been a recurrent problem for her. She also states that she has not received her montelukast therapy. That was prescribed previously. She also states that she is developed. A boil on her right inner thigh, which is painful, but has not began to drain.   Review of Systems  Constitutional: Negative.   HENT: Positive for congestion, rhinorrhea and postnasal drip.   Respiratory: Positive for cough, chest tightness, shortness of breath and wheezing.   Cardiovascular: Negative.   Gastrointestinal: Negative.   Genitourinary: Negative.   Musculoskeletal: Negative.   Skin: Positive for wound.  Neurological: Negative.   Hematological: Negative.   Psychiatric/Behavioral: Negative.        Objective:   Physical Exam  Constitutional: She is oriented to person, place, and time. She appears well-developed and well-nourished.  HENT:  Head: Normocephalic and atraumatic.  Right Ear: External ear normal.  Left Ear: External ear normal.       Postnasal drainage noted  Eyes: Conjunctivae and EOM are normal. Pupils are equal, round, and reactive to light.  Neck: Normal range of motion. Neck supple.  Cardiovascular: Normal rate, regular rhythm, normal heart sounds and intact distal pulses.   Pulmonary/Chest: Effort normal. She has wheezes.       Poor air exchange noted in upper and lower lung  fields bilaterally. Very high-pitched wheezing auscultated in the upper airways.  Abdominal: Soft. Bowel sounds are normal.  Musculoskeletal: Normal range of motion.  Neurological: She is alert and oriented to person, place, and time. No cranial nerve deficit. She exhibits normal muscle tone. Coordination normal.  Skin: Skin is warm and dry.       Small indurated, slightly fluctuant raised area to the left inguinal region. No head or drainage noted, but erythema and tenderness. Present.  Psychiatric: She has a normal mood and affect. Her behavior is normal. Judgment and thought content normal.          Assessment & Plan:  1. HIV. Repeat staging labs today. Continue present management, followup in 2 weeks.  2. COPD. Albuterol nebulizer breathing treatment today in clinic. To followup with [facility] regarding her Singulair. Prescription. Encouraged to use her nebulizer treatment more readily than her MDI. We'll reevaluate her respiratory status upon return to clinic. It problem remains unresolved or uncontrolled. A referral to pulmonary medicine as necessary.  3. Abscess Left Inguinal Region. At this point, antibiotic therapy, probably will not be effective. Would recommend applying warm moist heat to the area 3-4 times a day and if the wound continues to enlarge, but will not drain she should go to urgent care to have the wound I&D.  Verbally acknowledged all information provided and agreed with plan of care.

## 2010-11-28 LAB — CBC WITH DIFFERENTIAL/PLATELET
Eosinophils Absolute: 0.1 10*3/uL (ref 0.0–0.7)
Eosinophils Relative: 1 % (ref 0–5)
HCT: 41.4 % (ref 36.0–46.0)
Hemoglobin: 12.9 g/dL (ref 12.0–15.0)
Lymphocytes Relative: 40 % (ref 12–46)
Lymphs Abs: 3 10*3/uL (ref 0.7–4.0)
MCH: 28.6 pg (ref 26.0–34.0)
MCV: 91.8 fL (ref 78.0–100.0)
Monocytes Absolute: 0.4 10*3/uL (ref 0.1–1.0)
Monocytes Relative: 5 % (ref 3–12)
RBC: 4.51 MIL/uL (ref 3.87–5.11)
WBC: 7.4 10*3/uL (ref 4.0–10.5)

## 2010-11-28 LAB — COMPREHENSIVE METABOLIC PANEL
ALT: 15 U/L (ref 0–35)
CO2: 29 mEq/L (ref 19–32)
Calcium: 8.8 mg/dL (ref 8.4–10.5)
Chloride: 102 mEq/L (ref 96–112)
Creat: 1.21 mg/dL — ABNORMAL HIGH (ref 0.40–1.20)
Glucose, Bld: 119 mg/dL — ABNORMAL HIGH (ref 70–99)
Total Bilirubin: 0.2 mg/dL — ABNORMAL LOW (ref 0.3–1.2)

## 2010-11-28 LAB — LIPID PANEL
HDL: 46 mg/dL (ref >39)
LDL Cholesterol: 138 mg/dL — ABNORMAL HIGH (ref 0–99)
Triglycerides: 295 mg/dL — ABNORMAL HIGH (ref <150)

## 2010-11-28 LAB — T-HELPER CELL (CD4) - (RCID CLINIC ONLY): CD4 T Cell Abs: 1100 uL (ref 400–2700)

## 2010-12-01 ENCOUNTER — Telehealth: Payer: Self-pay | Admitting: *Deleted

## 2010-12-01 LAB — HIV-1 RNA QUANT-NO REFLEX-BLD
HIV 1 RNA Quant: 31 copies/mL — ABNORMAL HIGH (ref <20)
HIV-1 RNA Quant, Log: 1.49 {Log} — ABNORMAL HIGH (ref <1.30)

## 2010-12-01 NOTE — Telephone Encounter (Signed)
Prior auth form for medicaid about her singulair to NP for answers to questions & his signature

## 2010-12-02 ENCOUNTER — Other Ambulatory Visit: Payer: Self-pay | Admitting: Adult Health

## 2010-12-02 DIAGNOSIS — J302 Other seasonal allergic rhinitis: Secondary | ICD-10-CM

## 2010-12-11 ENCOUNTER — Encounter: Payer: Self-pay | Admitting: Adult Health

## 2010-12-11 ENCOUNTER — Ambulatory Visit (INDEPENDENT_AMBULATORY_CARE_PROVIDER_SITE_OTHER): Payer: Medicaid Other | Admitting: Adult Health

## 2010-12-11 DIAGNOSIS — E7800 Pure hypercholesterolemia, unspecified: Secondary | ICD-10-CM | POA: Insufficient documentation

## 2010-12-11 DIAGNOSIS — J449 Chronic obstructive pulmonary disease, unspecified: Secondary | ICD-10-CM

## 2010-12-11 DIAGNOSIS — B20 Human immunodeficiency virus [HIV] disease: Secondary | ICD-10-CM

## 2010-12-11 DIAGNOSIS — E785 Hyperlipidemia, unspecified: Secondary | ICD-10-CM

## 2010-12-11 DIAGNOSIS — I10 Essential (primary) hypertension: Secondary | ICD-10-CM

## 2010-12-11 MED ORDER — PRAVASTATIN SODIUM 20 MG PO TABS: 20.0000 mg | ORAL_TABLET | Freq: Every day | ORAL | Status: DC

## 2010-12-11 MED ORDER — ASPIRIN EC 81 MG PO TBEC: 81.0000 mg | DELAYED_RELEASE_TABLET | Freq: Every day | ORAL | Status: DC

## 2010-12-11 NOTE — Progress Notes (Signed)
  Subjective:    Patient ID: [name], female    DOB: 03/01/1946, 65 y.o.   MRN: [id number]  HPI [name] presents to clinic today for routine scheduled followup after labs drawn 2 weeks ago. She remains adherent to her antiretrovirals with good tolerance and no complications. She has resumed using her nebulizer treatments for her COPD at home as was instructed on her last visit. She states her breathing has improved much more and has had fewer episodes of dyspnea and shortness of breath.   Review of Systems  Constitutional: Negative.   HENT: Negative.   Eyes: Negative.   Respiratory: Positive for cough, chest tightness, shortness of breath and wheezing.   Cardiovascular: Negative.   Gastrointestinal: Negative.   Genitourinary: Negative.   Musculoskeletal: Negative.   Skin: Negative.   Neurological: Negative.   Hematological: Negative.   Psychiatric/Behavioral: Negative.        Objective:   Physical Exam  Constitutional: She is oriented to person, place, and time. She appears well-developed and well-nourished. No distress.  HENT:  Head: Normocephalic and atraumatic.  Eyes: Conjunctivae and EOM are normal. Pupils are equal, round, and reactive to light.  Neck: Normal range of motion. Neck supple.  Cardiovascular: Normal rate and regular rhythm.   Pulmonary/Chest: Effort normal. No respiratory distress. She has wheezes. She has no rales. She exhibits no tenderness.  Abdominal: Soft. Bowel sounds are normal.  Musculoskeletal: Normal range of motion.  Neurological: She is alert and oriented to person, place, and time. No cranial nerve deficit. She exhibits normal muscle tone. Coordination normal.  Skin: Skin is warm and dry.  Psychiatric: She has a normal mood and affect. Her behavior is normal. Judgment and thought content normal.          Assessment & Plan:  1. HIV. Labs obtained 11/27/2010 show a CD4 count of 1100 at 33% and a viral load of 31 copies/mL. She's remains  clinically stable on her current regimen. Recommend continuing present management, repeating labs in 10 weeks with a followup in 3 months.  2. Dyslipidemia. On on 11/27/2010. She also had a serum cholesterol of 243 mg/dL, triglycerides of 517 mg/dL, LDL of 616 mg/dL, and his VLDL of 59 mg/dL. Present. She is not on any antilipemics so, we will begin pravastatin 20 mg, one by mouth daily, and ask her to also start ASA 81 mg by mouth daily.  3. Hypertension. Her blood pressure was up again today and we instructed her to take her blood pressure, at least twice a week and record it and should discontinue on next visit. We will add hydrochlorothiazide to her regimen to see if this assists. However, her hypertension. May also be related to her COPD and she also relates that she is was a little under stress before she came to clinic. We will still continue to monitor.  4. COPD. Although her lung exam still shows some wheezing, she was not in marked distress. Like she was last time we saw her in clinic. We will continue to monitor this and modify therapy as needed.  She verbally acknowledged all information was provided to her and agreed with plan of care.

## 2010-12-16 ENCOUNTER — Other Ambulatory Visit: Payer: Self-pay | Admitting: *Deleted

## 2010-12-16 DIAGNOSIS — E785 Hyperlipidemia, unspecified: Secondary | ICD-10-CM

## 2010-12-16 MED ORDER — PRAVASTATIN SODIUM 20 MG PO TABS: 20.0000 mg | ORAL_TABLET | Freq: Every day | ORAL | Status: DC

## 2011-01-06 ENCOUNTER — Other Ambulatory Visit: Payer: Self-pay | Admitting: Adult Health

## 2011-01-06 DIAGNOSIS — F329 Major depressive disorder, single episode, unspecified: Secondary | ICD-10-CM

## 2011-01-06 DIAGNOSIS — F32A Depression, unspecified: Secondary | ICD-10-CM

## 2011-01-06 DIAGNOSIS — B20 Human immunodeficiency virus [HIV] disease: Secondary | ICD-10-CM

## 2011-01-13 ENCOUNTER — Telehealth: Payer: Self-pay | Admitting: *Deleted

## 2011-01-13 DIAGNOSIS — J301 Allergic rhinitis due to pollen: Secondary | ICD-10-CM

## 2011-01-13 NOTE — Telephone Encounter (Signed)
States her allergy meds "have stopped working" & she is asking for something else to be ordered. Uses [facility] on [address]. Told her I will send this to NP & I will call her when he responds [phone]

## 2011-01-14 MED ORDER — FLUTICASONE PROPIONATE 50 MCG/ACT NA SUSP: 2.0000 | Freq: Every day | NASAL | Status: DC

## 2011-01-14 NOTE — Telephone Encounter (Signed)
NP has sent a rx in for her allergies. I was unable to reach her to tell her this

## 2011-02-04 ENCOUNTER — Inpatient Hospital Stay (INDEPENDENT_AMBULATORY_CARE_PROVIDER_SITE_OTHER)
Admission: RE | Admit: 2011-02-04 | Discharge: 2011-02-04 | Disposition: A | Discharge status: Home / Self Care | Payer: Medicaid Other | Source: Ambulatory Visit | Attending: Emergency Medicine | Admitting: Emergency Medicine

## 2011-02-04 DIAGNOSIS — L259 Unspecified contact dermatitis, unspecified cause: Secondary | ICD-10-CM

## 2011-02-08 ENCOUNTER — Emergency Department (HOSPITAL_COMMUNITY)
Admission: EM | Admit: 2011-02-08 | Discharge: 2011-02-08 | Disposition: A | Discharge status: Home / Self Care | Payer: Medicaid Other | Attending: Emergency Medicine | Admitting: Emergency Medicine

## 2011-02-08 DIAGNOSIS — M79609 Pain in unspecified limb: Secondary | ICD-10-CM | POA: Insufficient documentation

## 2011-02-08 DIAGNOSIS — F3289 Other specified depressive episodes: Secondary | ICD-10-CM | POA: Insufficient documentation

## 2011-02-08 DIAGNOSIS — J449 Chronic obstructive pulmonary disease, unspecified: Secondary | ICD-10-CM | POA: Insufficient documentation

## 2011-02-08 DIAGNOSIS — Z21 Asymptomatic human immunodeficiency virus [HIV] infection status: Secondary | ICD-10-CM | POA: Insufficient documentation

## 2011-02-08 DIAGNOSIS — Z8619 Personal history of other infectious and parasitic diseases: Secondary | ICD-10-CM | POA: Insufficient documentation

## 2011-02-08 DIAGNOSIS — R21 Rash and other nonspecific skin eruption: Secondary | ICD-10-CM | POA: Insufficient documentation

## 2011-02-08 DIAGNOSIS — Z79899 Other long term (current) drug therapy: Secondary | ICD-10-CM | POA: Insufficient documentation

## 2011-02-08 DIAGNOSIS — IMO0002 Reserved for concepts with insufficient information to code with codable children: Secondary | ICD-10-CM | POA: Insufficient documentation

## 2011-02-08 DIAGNOSIS — M810 Age-related osteoporosis without current pathological fracture: Secondary | ICD-10-CM | POA: Insufficient documentation

## 2011-02-08 DIAGNOSIS — J4489 Other specified chronic obstructive pulmonary disease: Secondary | ICD-10-CM | POA: Insufficient documentation

## 2011-02-08 DIAGNOSIS — F329 Major depressive disorder, single episode, unspecified: Secondary | ICD-10-CM | POA: Insufficient documentation

## 2011-03-03 ENCOUNTER — Telehealth: Payer: Self-pay | Admitting: *Deleted

## 2011-03-03 ENCOUNTER — Ambulatory Visit (INDEPENDENT_AMBULATORY_CARE_PROVIDER_SITE_OTHER): Payer: Medicaid Other | Admitting: Adult Health

## 2011-03-03 ENCOUNTER — Encounter: Payer: Self-pay | Admitting: Adult Health

## 2011-03-03 VITALS — BP 98/66 | HR 109 | Temp 98.3°F | Ht 69.0 in | Wt 215.0 lb

## 2011-03-03 DIAGNOSIS — Z23 Encounter for immunization: Secondary | ICD-10-CM

## 2011-03-03 DIAGNOSIS — F40298 Other specified phobia: Secondary | ICD-10-CM

## 2011-03-03 DIAGNOSIS — F22 Delusional disorders: Secondary | ICD-10-CM

## 2011-03-03 DIAGNOSIS — L299 Pruritus, unspecified: Secondary | ICD-10-CM

## 2011-03-03 DIAGNOSIS — F19939 Other psychoactive substance use, unspecified with withdrawal, unspecified: Secondary | ICD-10-CM

## 2011-03-03 DIAGNOSIS — B20 Human immunodeficiency virus [HIV] disease: Secondary | ICD-10-CM

## 2011-03-03 NOTE — Telephone Encounter (Signed)
C/o pain & itching in both arms for 2 weeks. Wants to be seen. Transferred to front for an appt. Told her it would likely be tomorrow as I did not think we had any appts left

## 2011-03-05 ENCOUNTER — Other Ambulatory Visit: Payer: Self-pay | Admitting: Adult Health

## 2011-03-18 ENCOUNTER — Other Ambulatory Visit: Payer: Self-pay | Admitting: Dermatology

## 2011-03-19 NOTE — Progress Notes (Signed)
  Subjective:    Patient ID: [name], female    DOB: 04-Apr-1946, 65 y.o.   MRN: [id number]  HPI Presents with complaints of itching and irritation to, arms, and lower extremities. Start occurring shortly after she had stopped taking any pain medication as a result of being discharged from the pain clinic. She also complains of nervousness and irritability with increased pain. She was discharged from the pain clinic is result of taking medication for which she was prescribed, and showed up in her UDS.   Review of Systems  Constitutional: Positive for fatigue.  HENT: Negative.   Eyes: Negative.   Respiratory: Negative.   Cardiovascular: Negative.   Gastrointestinal: Negative.   Genitourinary: Negative.   Musculoskeletal: Positive for back pain, arthralgias and gait problem.  Skin: Positive for rash.  Neurological: Positive for tremors.  Hematological: Negative.   Psychiatric/Behavioral: Positive for sleep disturbance, decreased concentration and agitation. The patient is nervous/anxious.        Objective:   Physical Exam  Constitutional: She appears well-developed and well-nourished.       Appears anxious and moderately uncomfortable  HENT:  Head: Normocephalic and atraumatic.  Eyes: Conjunctivae and EOM are normal. Pupils are equal, round, and reactive to light.  Neck: Normal range of motion. Neck supple.  Cardiovascular: Normal rate and regular rhythm.   Pulmonary/Chest: Effort normal and breath sounds normal.  Abdominal: Soft. Bowel sounds are normal.  Skin:       Multiple scabbed areas that appear has abrasions consistent with scratch marks over her legs and arms.  Psychiatric: Judgment and thought content normal. Her mood appears anxious. Her speech is rapid and/or pressured. She is agitated. She exhibits a depressed mood. She exhibits abnormal recent memory.          Assessment & Plan:  1. Skin Abrasions Consistent with Scratching. Most likely a result of  withdrawal from long-term narcotic use. There is also a component of anxiety, and some physiologic signs of withdrawal. Normally, we would recommend this be further evaluated by psych and possibly dermatology. However, due to her type of Medicaid insurance such referrals need to originate from her primary care physician. She was then referred to them. This office contacted her physician's office and learned that she could walk in and see someone tomorrow. She was informed of this and instructed to do so. Followup regarding this problem, should be made with her PCP.

## 2011-04-02 ENCOUNTER — Other Ambulatory Visit: Payer: Self-pay | Admitting: Adult Health

## 2011-04-02 ENCOUNTER — Other Ambulatory Visit: Payer: Self-pay | Admitting: *Deleted

## 2011-04-02 DIAGNOSIS — E785 Hyperlipidemia, unspecified: Secondary | ICD-10-CM

## 2011-04-02 MED ORDER — PRAVASTATIN SODIUM 20 MG PO TABS: 20.0000 mg | ORAL_TABLET | Freq: Every day | ORAL | Status: DC

## 2011-05-22 ENCOUNTER — Emergency Department (HOSPITAL_COMMUNITY)
Admission: EM | Admit: 2011-05-22 | Discharge: 2011-05-22 | Disposition: A | Discharge status: Home / Self Care | Payer: Medicaid Other | Attending: Emergency Medicine | Admitting: Emergency Medicine

## 2011-05-22 ENCOUNTER — Encounter (HOSPITAL_COMMUNITY): Payer: Self-pay | Admitting: *Deleted

## 2011-05-22 DIAGNOSIS — N611 Abscess of the breast and nipple: Secondary | ICD-10-CM

## 2011-05-22 DIAGNOSIS — Z79899 Other long term (current) drug therapy: Secondary | ICD-10-CM | POA: Insufficient documentation

## 2011-05-22 DIAGNOSIS — Z7982 Long term (current) use of aspirin: Secondary | ICD-10-CM | POA: Insufficient documentation

## 2011-05-22 DIAGNOSIS — F17200 Nicotine dependence, unspecified, uncomplicated: Secondary | ICD-10-CM | POA: Insufficient documentation

## 2011-05-22 DIAGNOSIS — N61 Mastitis without abscess: Secondary | ICD-10-CM | POA: Insufficient documentation

## 2011-05-22 DIAGNOSIS — J4489 Other specified chronic obstructive pulmonary disease: Secondary | ICD-10-CM | POA: Insufficient documentation

## 2011-05-22 DIAGNOSIS — M25569 Pain in unspecified knee: Secondary | ICD-10-CM | POA: Insufficient documentation

## 2011-05-22 DIAGNOSIS — E785 Hyperlipidemia, unspecified: Secondary | ICD-10-CM | POA: Insufficient documentation

## 2011-05-22 DIAGNOSIS — Z21 Asymptomatic human immunodeficiency virus [HIV] infection status: Secondary | ICD-10-CM | POA: Insufficient documentation

## 2011-05-22 DIAGNOSIS — M25562 Pain in left knee: Secondary | ICD-10-CM

## 2011-05-22 DIAGNOSIS — M25561 Pain in right knee: Secondary | ICD-10-CM

## 2011-05-22 DIAGNOSIS — J449 Chronic obstructive pulmonary disease, unspecified: Secondary | ICD-10-CM | POA: Insufficient documentation

## 2011-05-22 HISTORY — DX: Chronic obstructive pulmonary disease, unspecified: J44.9

## 2011-05-22 HISTORY — DX: Hyperlipidemia, unspecified: E78.5

## 2011-05-22 HISTORY — DX: Human immunodeficiency virus (HIV) disease: B20

## 2011-05-22 MED ORDER — DOXYCYCLINE HYCLATE 100 MG PO TABS
100.0000 mg | ORAL_TABLET | Freq: Once | ORAL | Status: AC
  Administered 2011-05-22: 100 mg via ORAL
  Filled 2011-05-22: qty 1

## 2011-05-22 MED ORDER — HYDROCODONE-ACETAMINOPHEN 5-325 MG PO TABS: 1.0000 | ORAL_TABLET | ORAL | Status: AC | PRN

## 2011-05-22 MED ORDER — DOXYCYCLINE HYCLATE 100 MG PO CAPS: 100.0000 mg | ORAL_CAPSULE | Freq: Two times a day (BID) | ORAL | Status: AC

## 2011-05-22 NOTE — ED Notes (Signed)
Patient reports she needs her knees checked and she has an abcess on her right breast

## 2011-05-22 NOTE — ED Provider Notes (Signed)
History     CSN: [id number] Arrival date & time: 05/22/2011 11:53 AM   First MD Initiated Contact with Patient 05/22/11 1405      Chief Complaint  Patient presents with  . Wound Check    (Consider location/radiation/quality/duration/timing/severity/associated sxs/prior treatment) HPI Comments: Patient presents with 2 complaints today.  The first complaint is that she has bilateral knee pain that has been going on for the last 2 weeks.  She had no acute injury or trauma.  She has no fevers, redness, warmth.  She is able to bear weight.  Patient notes worsening pain with movement.  Patient has been using Tylenol for pain with some relief.  Patient also notes that she has an abscess in her right nipple which has been draining some purulent drainage since yesterday.  No fevers.  No surrounding erythema or warmth.  Patient is a 65 y.o. female presenting with wound check. The history is provided by the patient. No language interpreter was used.  Wound Check     Past Medical History  Diagnosis Date  . COPD (chronic obstructive pulmonary disease)   . Hyperlipidemia   . HIV (human immunodeficiency virus infection)     Past Surgical History  Procedure Date  . Abcess drainage     No family history on file.  History  Substance Use Topics  . Smoking status: Current Everyday Smoker -- 1.0 packs/day    Types: Cigarettes  . Smokeless tobacco: Never Used  . Alcohol Use: No    OB History    Grav Para Term Preterm Abortions TAB SAB Ect Mult Living                  Review of Systems  Constitutional: Negative.  Negative for fever and chills.  HENT: Negative.   Eyes: Negative.  Negative for discharge and redness.  Respiratory: Negative.  Negative for cough and shortness of breath.   Cardiovascular: Negative.  Negative for chest pain.  Gastrointestinal: Negative.  Negative for nausea, vomiting, abdominal pain and diarrhea.  Genitourinary: Negative.  Negative for dysuria and vaginal  discharge.  Musculoskeletal: Positive for arthralgias. Negative for back pain.  Skin: Positive for wound. Negative for color change and rash.  Neurological: Negative.  Negative for syncope and headaches.  Hematological: Negative.  Negative for adenopathy.  Psychiatric/Behavioral: Negative.  Negative for confusion.  All other systems reviewed and are negative.    Allergies  Review of patient's allergies indicates no known allergies.  Home Medications   Current Outpatient Rx  Name Route Sig Dispense Refill  . ADVAIR DISKUS 500-50 MCG/DOSE IN AEPB  TAKE 1 INHALATION BY MOUTH TWICE DAILY 60 each PRN  . ALBUTEROL SULFATE (2.5 MG/3ML) 0.083% IN NEBU Nebulization Take 2.5 mg by nebulization every 4 (four) hours as needed. For shortness of breath.    . ALBUTEROL SULFATE HFA 108 (90 BASE) MCG/ACT IN AERS       . ALLERGY RELIEF 10 MG PO TABS  TAKE 1 TABLET BY MOUTH DAILY 30 tablet PRN  . ASPIRIN EC 81 MG PO TBEC Oral Take 1 tablet (81 mg total) by mouth daily. 150 tablet 2  . EFAVIRENZ-EMTRICITAB-TENOFOVIR 600-200-300 MG PO TABS Oral Take 1 tablet by mouth daily.      Marland Kitchen FLUTICASONE PROPIONATE 50 MCG/ACT NA SUSP Nasal Place 2 sprays into the nose daily. 16 g 6  . GABAPENTIN 300 MG PO CAPS  TAKE 1 CAPSULE (300 MG TOTAL) BY MOUTH 3 (THREE) TIMES DAILY 90 capsule 11  .  IBUPROFEN 800 MG PO TABS Oral Take 800 mg by mouth 3 (three) times daily.      Marland Kitchen PRAVASTATIN SODIUM 20 MG PO TABS Oral Take 1 tablet (20 mg total) by mouth daily. 30 tablet 6  . SEROQUEL XR 150 MG PO TB24  TAKE 1 TABLET BY MOUTH EVERY NIGHT AT BEDTIME 30 tablet PRN  . VENLAFAXINE HCL 150 MG PO CP24  TAKE 1 CAPSULE BY MOUTH DAILY 30 capsule 11    BP 110/63  Pulse 98  Temp(Src) 98.6 F (37 C) (Oral)  Resp 18  SpO2 93%  Physical Exam  Constitutional: She is oriented to person, place, and time. She appears well-developed and well-nourished.  HENT:  Head: Normocephalic and atraumatic.  Eyes: Conjunctivae and EOM are normal.  Pupils are equal, round, and reactive to light.  Neck: Normal range of motion. Neck supple.  Pulmonary/Chest: Effort normal.  Musculoskeletal: Normal range of motion.       Bilateral knees have no erythema, warmth, crepitus.  Full range of motion noted.  Patient able to bear weight normally.  Neurological: She is alert and oriented to person, place, and time.  Skin: No rash noted. No erythema.       Right nipple has area on lateral edge of purulent drainage.  No fluctuance noted.  No surrounding erythema or induration of the nipple or breast.  No palpable masses noted  Psychiatric: She has a normal mood and affect. Her behavior is normal. Judgment normal.    ED Course  Procedures (including critical care time)  Labs Reviewed - No data to display No results found.   No diagnosis found.    MDM  With no acute findings for septic arthritis or gout in her knees.  No erythema, crepitus or warmth.  Patient is able to bear weight and ambulate.  She had no acute trauma.  Given all this I feel that this may be a chronic arthritis process for which she can followup with her primary care physician and orthopedics.  She understands this and will do so.  Patient also has a small draining abscess in her right nipple which I do not feel requires further drainage as its already draining appropriately at this time with no further fluctuance.  Patient will be placed on antibiotics and again have followup with her primary care physician as needed        Nat Christen, MD 05/22/11 1420

## 2011-06-08 ENCOUNTER — Other Ambulatory Visit: Payer: Self-pay | Admitting: Licensed Clinical Social Worker

## 2011-06-08 MED ORDER — ALBUTEROL SULFATE HFA 108 (90 BASE) MCG/ACT IN AERS: 2.0000 | INHALATION_SPRAY | Freq: Four times a day (QID) | RESPIRATORY_TRACT | Status: DC | PRN

## 2011-06-29 ENCOUNTER — Other Ambulatory Visit: Payer: Self-pay | Admitting: *Deleted

## 2011-06-29 DIAGNOSIS — B20 Human immunodeficiency virus [HIV] disease: Secondary | ICD-10-CM

## 2011-06-29 MED ORDER — EFAVIRENZ-EMTRICITAB-TENOFOVIR 600-200-300 MG PO TABS: 1.0000 | ORAL_TABLET | Freq: Every day | ORAL | Status: DC

## 2011-06-29 MED ORDER — ALBUTEROL SULFATE (2.5 MG/3ML) 0.083% IN NEBU: 2.5000 mg | INHALATION_SOLUTION | RESPIRATORY_TRACT | Status: DC | PRN

## 2011-08-06 ENCOUNTER — Other Ambulatory Visit (HOSPITAL_COMMUNITY): Payer: Self-pay | Admitting: Family Medicine

## 2011-08-06 DIAGNOSIS — Z1231 Encounter for screening mammogram for malignant neoplasm of breast: Secondary | ICD-10-CM

## 2011-09-03 ENCOUNTER — Ambulatory Visit (HOSPITAL_COMMUNITY): Payer: Medicaid Other

## 2011-10-16 ENCOUNTER — Other Ambulatory Visit: Payer: Self-pay | Admitting: *Deleted

## 2011-10-16 DIAGNOSIS — B20 Human immunodeficiency virus [HIV] disease: Secondary | ICD-10-CM

## 2011-10-16 MED ORDER — EFAVIRENZ-EMTRICITAB-TENOFOVIR 600-200-300 MG PO TABS: 1.0000 | ORAL_TABLET | Freq: Every day | ORAL | Status: DC

## 2011-11-13 ENCOUNTER — Other Ambulatory Visit: Payer: Self-pay | Admitting: Licensed Clinical Social Worker

## 2011-11-13 DIAGNOSIS — J302 Other seasonal allergic rhinitis: Secondary | ICD-10-CM

## 2011-11-13 DIAGNOSIS — B20 Human immunodeficiency virus [HIV] disease: Secondary | ICD-10-CM

## 2011-11-13 MED ORDER — EFAVIRENZ-EMTRICITAB-TENOFOVIR 600-200-300 MG PO TABS: 1.0000 | ORAL_TABLET | Freq: Every day | ORAL | Status: DC

## 2011-11-13 MED ORDER — LORATADINE 10 MG PO TABS: 10.0000 mg | ORAL_TABLET | Freq: Every day | ORAL | Status: DC

## 2011-11-18 ENCOUNTER — Other Ambulatory Visit: Payer: Self-pay | Admitting: *Deleted

## 2011-11-18 DIAGNOSIS — E785 Hyperlipidemia, unspecified: Secondary | ICD-10-CM

## 2011-11-18 MED ORDER — PRAVASTATIN SODIUM 20 MG PO TABS: 20.0000 mg | ORAL_TABLET | Freq: Every day | ORAL | Status: DC

## 2011-11-18 NOTE — Telephone Encounter (Signed)
I refilled 1 month of statin & called her to set up an appt. Overdue for a visit. Left a message

## 2011-12-01 ENCOUNTER — Telehealth: Payer: Self-pay | Admitting: *Deleted

## 2011-12-01 NOTE — Telephone Encounter (Signed)
I called the number listed & a woman answered. She does not know this pt. Says it is a wrong number. Referral made to bridge counselling

## 2011-12-03 ENCOUNTER — Encounter: Payer: Self-pay | Admitting: *Deleted

## 2011-12-03 ENCOUNTER — Other Ambulatory Visit: Payer: Self-pay | Admitting: Internal Medicine

## 2011-12-03 ENCOUNTER — Other Ambulatory Visit: Payer: Medicaid Other

## 2011-12-03 DIAGNOSIS — Z79899 Other long term (current) drug therapy: Secondary | ICD-10-CM

## 2011-12-03 DIAGNOSIS — Z113 Encounter for screening for infections with a predominantly sexual mode of transmission: Secondary | ICD-10-CM

## 2011-12-03 DIAGNOSIS — B20 Human immunodeficiency virus [HIV] disease: Secondary | ICD-10-CM

## 2011-12-03 LAB — COMPREHENSIVE METABOLIC PANEL
ALT: 13 U/L (ref 0–35)
AST: 18 U/L (ref 0–37)
BUN: 19 mg/dL (ref 6–23)
Calcium: 8.7 mg/dL (ref 8.4–10.5)
Chloride: 106 mEq/L (ref 96–112)
Creat: 1.37 mg/dL — ABNORMAL HIGH (ref 0.50–1.10)
Total Bilirubin: 0.2 mg/dL — ABNORMAL LOW (ref 0.3–1.2)

## 2011-12-03 LAB — CBC WITH DIFFERENTIAL/PLATELET
Basophils Absolute: 0 10*3/uL (ref 0.0–0.1)
Basophils Relative: 0 % (ref 0–1)
Eosinophils Relative: 1 % (ref 0–5)
HCT: 39.6 % (ref 36.0–46.0)
Hemoglobin: 13.1 g/dL (ref 12.0–15.0)
Lymphocytes Relative: 26 % (ref 12–46)
MCHC: 33.1 g/dL (ref 30.0–36.0)
MCV: 89 fL (ref 78.0–100.0)
Monocytes Absolute: 0.4 10*3/uL (ref 0.1–1.0)
Monocytes Relative: 5 % (ref 3–12)
Neutro Abs: 5.5 10*3/uL (ref 1.7–7.7)
RDW: 15.2 % (ref 11.5–15.5)

## 2011-12-03 LAB — LIPID PANEL
HDL: 48 mg/dL (ref >39)
LDL Cholesterol: 87 mg/dL (ref 0–99)
Total CHOL/HDL Ratio: 3.7 Ratio
VLDL: 44 mg/dL — ABNORMAL HIGH (ref 0–40)

## 2011-12-03 LAB — RPR

## 2011-12-03 NOTE — Patient Instructions (Addendum)
Pt advised to apply OTC hydrocortisone ointment on forearms 3 times a day after moistening the skin with warm, moist wash cloth.  Advised to stop OTC Claritin and switch to either OTC Zyrtec or OTC Allegra for seasonal allergies and for the itching.  Not taking benadryl.  Advised to keep skin clean and avoid scratching as much as possible.

## 2011-12-03 NOTE — Progress Notes (Signed)
Patient ID: [name], female   DOB: [birth year], 66 y.o.   MRN: [id number] Seen 2 times by PCP Dr. Lynne Leader @ [facility] for rash on forearms.  Given rx for prednisone and cream for arms.  Pt taking Claritin for seasonal allergies but states that it is not working now.  Rash itches and is severely painful.  Taking OTC Aleve, 6 tablets at-a-time.  RN advised that pt should follow the package instructions for OTC medications.  Pt verbalized that she knew this but she is having a great deal of pain.  Pt has not been to [facility] in a year.  Offered pt lab appt for today and f/u appt for next Thurs, June 13.  Pt agreed to lab and MD appt.  Pt receives her PAP smears from a women's research study in every August in [place].  RN requested that the pt ask the study to fax PAP smear results to [facility] the August.  Pt given fax # for [facility].  Pt agreed to have PAP smear results sent this August.

## 2011-12-04 LAB — T-HELPER CELL (CD4) - (RCID CLINIC ONLY): CD4 % Helper T Cell: 34 % (ref 33–55)

## 2011-12-07 LAB — HIV-1 RNA QUANT-NO REFLEX-BLD
HIV 1 RNA Quant: 38 copies/mL — ABNORMAL HIGH (ref <20)
HIV-1 RNA Quant, Log: 1.58 {Log} — ABNORMAL HIGH (ref <1.30)

## 2011-12-09 ENCOUNTER — Telehealth: Payer: Self-pay | Admitting: *Deleted

## 2011-12-09 NOTE — Telephone Encounter (Signed)
Message left re:  appt tomorrow.

## 2011-12-10 ENCOUNTER — Ambulatory Visit (INDEPENDENT_AMBULATORY_CARE_PROVIDER_SITE_OTHER): Payer: Medicaid Other | Admitting: Internal Medicine

## 2011-12-10 ENCOUNTER — Encounter: Payer: Self-pay | Admitting: Internal Medicine

## 2011-12-10 VITALS — BP 133/76 | HR 94 | Temp 98.8°F | Ht 69.0 in | Wt 224.8 lb

## 2011-12-10 DIAGNOSIS — F17210 Nicotine dependence, cigarettes, uncomplicated: Secondary | ICD-10-CM | POA: Insufficient documentation

## 2011-12-10 DIAGNOSIS — R21 Rash and other nonspecific skin eruption: Secondary | ICD-10-CM

## 2011-12-10 DIAGNOSIS — B20 Human immunodeficiency virus [HIV] disease: Secondary | ICD-10-CM

## 2011-12-10 DIAGNOSIS — F17200 Nicotine dependence, unspecified, uncomplicated: Secondary | ICD-10-CM

## 2011-12-10 DIAGNOSIS — J45909 Unspecified asthma, uncomplicated: Secondary | ICD-10-CM | POA: Insufficient documentation

## 2011-12-10 NOTE — Progress Notes (Signed)
Patient ID: [name], female   DOB: April 21, 1946, 66 y.o.   MRN: [id number]     [facility] for Infectious Disease  Patient Active Problem List  Diagnosis  . HIV DISEASE  . DEPRESSION  . BREAST MASS, BENIGN  . OSTEOPOROSIS  . ALLERGIC RHINITIS, SEASONAL  . COPD  . Abscess of left groin  . Hyperlipidemia  . Hypertension  . Asthma  . Rash  . Cigarette smoker    Patient's Medications  New Prescriptions   No medications on file  Previous Medications   ADVAIR DISKUS 500-50 MCG/DOSE AEPB    TAKE 1 INHALATION BY MOUTH TWICE DAILY   ALBUTEROL (PROVENTIL) (2.5 MG/3ML) 0.083% NEBULIZER SOLUTION    Take 3 mLs (2.5 mg total) by nebulization every 4 (four) hours as needed. For shortness of breath.   ALBUTEROL (VENTOLIN HFA) 108 (90 BASE) MCG/ACT INHALER    Inhale 2 puffs into the lungs every 6 (six) hours as needed for wheezing.   ASPIRIN EC 81 MG TABLET    Take 1 tablet (81 mg total) by mouth daily.   EFAVIRENZ-EMTRICTABINE-TENOFOVIR (ATRIPLA) 600-200-300 MG PER TABLET    Take 1 tablet by mouth daily.   FLUTICASONE (FLONASE) 50 MCG/ACT NASAL SPRAY    Place 2 sprays into the nose daily.   GABAPENTIN (NEURONTIN) 300 MG CAPSULE    TAKE 1 CAPSULE (300 MG TOTAL) BY MOUTH 3 (THREE) TIMES DAILY   IBUPROFEN (ADVIL,MOTRIN) 800 MG TABLET    Take 800 mg by mouth 3 (three) times daily.     LORATADINE (ALLERGY RELIEF) 10 MG TABLET    Take 1 tablet (10 mg total) by mouth daily.   PRAVASTATIN (PRAVACHOL) 20 MG TABLET    Take 1 tablet (20 mg total) by mouth daily.   SEROQUEL XR 150 MG 24 HR TABLET    TAKE 1 TABLET BY MOUTH EVERY NIGHT AT BEDTIME   VENLAFAXINE (EFFEXOR-XR) 150 MG 24 HR CAPSULE    TAKE 1 CAPSULE BY MOUTH DAILY  Modified Medications   No medications on file  Discontinued Medications   No medications on file    Subjective: [name] is in for a followup visit. She denies missing any doses of her Atripla. She is bothered by a rash on her arms that began sometime last year. Dr.  Melven Day, her primary care physician referred her to Dr. Bufford Day for dermatologic evaluation. She had a skin biopsy but does not know the results. She was started on some topical cream that helped but she is now out of it. She states that she does not have a desire to quit smoking cigarettes.  Objective: Temp: 98.8 F (37.1 C) (06/13 1511) Temp src: Oral (06/13 1511) BP: 133/76 mmHg (06/13 1511) Pulse Rate: 94  (06/13 1511)  General: she appears uncomfortable due to her rash Skin: she has some chronic hyperpigmented rash on her forearms and some very dry skin Lungs: clear Cor: regular S1 and S2 no murmurs  Lab Results HIV 1 RNA Quant (copies/mL)  Date Value  12/03/2011 38*  11/27/2010 31*  08/07/2010 465*     CD4 T Cell Abs (cmm)  Date Value  12/03/2011 720   11/27/2010 1100   06/16/2010 850      Assessment: Her HIV remains reasonably well controlled. I will continue Atripla. Her creatinine is up slightly to 1.37. I will have her return after lab work in 3 months to see if her Atripla dosing needs to be readjusted.  I will see  if we can obtain records from her dermatologist her primary care physician regarding the cause and treatment of her rash.  I've encouraged her to quit smoking cigarettes.  Plan: 1. Continue Atripla 2. Return after lab work in 3 months   Michelle Asters, MD [facility] for Infectious Disease [facility] [phone] pager   [phone] cell 12/10/2011, 3:36 PM

## 2011-12-16 ENCOUNTER — Other Ambulatory Visit: Payer: Self-pay | Admitting: Adult Health

## 2011-12-16 DIAGNOSIS — J45909 Unspecified asthma, uncomplicated: Secondary | ICD-10-CM

## 2011-12-16 DIAGNOSIS — B20 Human immunodeficiency virus [HIV] disease: Secondary | ICD-10-CM

## 2011-12-16 MED ORDER — EFAVIRENZ-EMTRICITAB-TENOFOVIR 600-200-300 MG PO TABS: 1.0000 | ORAL_TABLET | Freq: Every day | ORAL | Status: DC

## 2011-12-24 ENCOUNTER — Telehealth: Payer: Self-pay | Admitting: *Deleted

## 2011-12-24 ENCOUNTER — Other Ambulatory Visit: Payer: Self-pay | Admitting: Licensed Clinical Social Worker

## 2011-12-24 DIAGNOSIS — B20 Human immunodeficiency virus [HIV] disease: Secondary | ICD-10-CM

## 2011-12-24 DIAGNOSIS — J302 Other seasonal allergic rhinitis: Secondary | ICD-10-CM

## 2011-12-24 DIAGNOSIS — F329 Major depressive disorder, single episode, unspecified: Secondary | ICD-10-CM

## 2011-12-24 DIAGNOSIS — F32A Depression, unspecified: Secondary | ICD-10-CM

## 2011-12-24 MED ORDER — LORATADINE 10 MG PO TABS: 10.0000 mg | ORAL_TABLET | Freq: Every day | ORAL | Status: DC

## 2011-12-24 MED ORDER — VENLAFAXINE HCL ER 150 MG PO CP24: 150.0000 mg | ORAL_CAPSULE | Freq: Every day | ORAL | Status: DC

## 2011-12-24 MED ORDER — GABAPENTIN 300 MG PO CAPS: 300.0000 mg | ORAL_CAPSULE | Freq: Three times a day (TID) | ORAL | Status: DC

## 2011-12-24 NOTE — Telephone Encounter (Signed)
Pt reported that Claritin is not working.  RN stated that she would call the pt for more information.

## 2011-12-25 ENCOUNTER — Other Ambulatory Visit: Payer: Self-pay | Admitting: *Deleted

## 2011-12-25 NOTE — Telephone Encounter (Signed)
Left message on the voice mail at [facility] requesting more information about the pt's refill issues for Singulair.  Fax received.  Medicaid Prior Authorization completed and approved for one year.  Faxed back approval information to [facility].

## 2012-01-08 ENCOUNTER — Ambulatory Visit (HOSPITAL_COMMUNITY): Payer: Medicaid Other | Attending: Family Medicine

## 2012-01-11 ENCOUNTER — Emergency Department (HOSPITAL_COMMUNITY): Payer: Medicaid Other

## 2012-01-11 ENCOUNTER — Ambulatory Visit: Payer: Medicaid Other | Admitting: Internal Medicine

## 2012-01-11 ENCOUNTER — Encounter (HOSPITAL_COMMUNITY): Payer: Self-pay | Admitting: Emergency Medicine

## 2012-01-11 ENCOUNTER — Emergency Department (HOSPITAL_COMMUNITY)
Admission: EM | Admit: 2012-01-11 | Discharge: 2012-01-11 | Disposition: A | Discharge status: Home / Self Care | Payer: Medicaid Other | Attending: Emergency Medicine | Admitting: Emergency Medicine

## 2012-01-11 DIAGNOSIS — Z21 Asymptomatic human immunodeficiency virus [HIV] infection status: Secondary | ICD-10-CM | POA: Insufficient documentation

## 2012-01-11 DIAGNOSIS — F17200 Nicotine dependence, unspecified, uncomplicated: Secondary | ICD-10-CM | POA: Insufficient documentation

## 2012-01-11 DIAGNOSIS — Z79899 Other long term (current) drug therapy: Secondary | ICD-10-CM | POA: Insufficient documentation

## 2012-01-11 DIAGNOSIS — J4489 Other specified chronic obstructive pulmonary disease: Secondary | ICD-10-CM | POA: Insufficient documentation

## 2012-01-11 DIAGNOSIS — R109 Unspecified abdominal pain: Secondary | ICD-10-CM | POA: Insufficient documentation

## 2012-01-11 DIAGNOSIS — J449 Chronic obstructive pulmonary disease, unspecified: Secondary | ICD-10-CM | POA: Insufficient documentation

## 2012-01-11 LAB — URINALYSIS, ROUTINE W REFLEX MICROSCOPIC
Glucose, UA: NEGATIVE mg/dL
Leukocytes, UA: NEGATIVE
Nitrite: NEGATIVE
Specific Gravity, Urine: 1.015 (ref 1.005–1.030)
pH: 6.5 (ref 5.0–8.0)

## 2012-01-11 MED ORDER — OXYCODONE-ACETAMINOPHEN 5-325 MG PO TABS
1.0000 | ORAL_TABLET | Freq: Once | ORAL | Status: AC
  Administered 2012-01-11: 1 via ORAL
  Filled 2012-01-11: qty 1

## 2012-01-11 MED ORDER — OXYCODONE-ACETAMINOPHEN 5-325 MG PO TABS: 1.0000 | ORAL_TABLET | ORAL | Status: AC | PRN

## 2012-01-11 NOTE — ED Provider Notes (Signed)
History     CSN: [id number]  Arrival date & time 01/11/12  1916   First MD Initiated Contact with Patient 01/11/12 1957      Chief Complaint  Patient presents with  . Flank Pain    (Consider location/radiation/quality/duration/timing/severity/associated sxs/prior treatment) HPI Comments: [name] is a 66 y.o. Female  who complains of left flank and low back pain for several days. She's not tried a medicine for the problem. She denies fever, chills, nausea, vomiting. She has been using stool softeners to help her have a bowel movement. Her last bowel movement was yesterday. She denies dysuria, urinary frequency, or hematuria. She's taking her usual medications without relief. She does not know of any aggravating symptoms.  Patient is a 66 y.o. female presenting with flank pain. The history is provided by the patient.  Flank Pain    Past Medical History  Diagnosis Date  . COPD (chronic obstructive pulmonary disease)   . Hyperlipidemia   . HIV (human immunodeficiency virus infection)     Past Surgical History  Procedure Date  . Abcess drainage     Family History  Problem Relation Age of Onset  . Pneumonia Mother   . Cancer Sister   . Hypertension Sister   . Diabetes Sister     History  Substance Use Topics  . Smoking status: Current Everyday Smoker -- 1.0 packs/day    Types: Cigarettes  . Smokeless tobacco: Never Used  . Alcohol Use: No    OB History    Grav Para Term Preterm Abortions TAB SAB Ect Mult Living                  Review of Systems  Genitourinary: Positive for flank pain.  All other systems reviewed and are negative.    Allergies  Review of patient's allergies indicates no known allergies.  Home Medications   Current Outpatient Rx  Name Route Sig Dispense Refill  . ALBUTEROL SULFATE HFA 108 (90 BASE) MCG/ACT IN AERS Inhalation Inhale 1 puff into the lungs 2 (two) times daily.    . ALBUTEROL SULFATE (2.5 MG/3ML) 0.083% IN NEBU  Nebulization Take 2.5 mg by nebulization every 4 (four) hours as needed. Wheezing or shortness of breath.    . ASPIRIN EC 81 MG PO TBEC Oral Take 81 mg by mouth daily.    . EFAVIRENZ-EMTRICITAB-TENOFOVIR 600-200-300 MG PO TABS Oral Take 1 tablet by mouth at bedtime.    Marland Kitchen FLUTICASONE-SALMETEROL 500-50 MCG/DOSE IN AEPB Inhalation Inhale 1 puff into the lungs every 12 (twelve) hours.    Marland Kitchen GABAPENTIN 300 MG PO CAPS Oral Take 300 mg by mouth 3 (three) times daily.    Marland Kitchen LORATADINE 10 MG PO TABS Oral Take 10 mg by mouth daily.    Marland Kitchen MONTELUKAST SODIUM 10 MG PO TABS Oral Take 10 mg by mouth at bedtime.    . OXYCODONE-ACETAMINOPHEN 5-325 MG PO TABS Oral Take 1 tablet by mouth every 8 (eight) hours as needed. Pain.    Marland Kitchen PRAVASTATIN SODIUM 20 MG PO TABS Oral Take 20 mg by mouth daily.    . QUETIAPINE FUMARATE ER 150 MG PO TB24 Oral Take 150 mg by mouth at bedtime.    . VENLAFAXINE HCL ER 150 MG PO CP24 Oral Take 150 mg by mouth daily.    . OXYCODONE-ACETAMINOPHEN 5-325 MG PO TABS Oral Take 1 tablet by mouth every 4 (four) hours as needed for pain. 15 tablet 0    BP 116/56  Pulse  88  Temp 99.1 F (37.3 C) (Oral)  Resp 18  SpO2 92%  Physical Exam  Nursing note and vitals reviewed. Constitutional: She is oriented to person, place, and time. She appears well-developed and well-nourished.  HENT:  Head: Normocephalic and atraumatic.  Eyes: Conjunctivae and EOM are normal. Pupils are equal, round, and reactive to light.  Neck: Normal range of motion and phonation normal. Neck supple.  Cardiovascular: Normal rate, regular rhythm and intact distal pulses.   Pulmonary/Chest: Effort normal and breath sounds normal. She exhibits no tenderness.  Abdominal: Soft. She exhibits no distension. There is no tenderness. There is no guarding.  Genitourinary:       No costovertebral angle tenderness  Musculoskeletal: Normal range of motion.  Neurological: She is alert and oriented to person, place, and time. She has  normal strength. She exhibits normal muscle tone.  Skin: Skin is warm and dry.  Psychiatric: She has a normal mood and affect. Her behavior is normal. Judgment and thought content normal.    ED Course  Procedures (including critical care time)   Date: 01/11/2012  Rate: 100  Rhythm: sinus tachycardia  QRS Axis: normal  Intervals: normal  ST/T Wave abnormalities: normal  Conduction Disutrbances:none  Narrative Interpretation: prior ischemia, resolved.  Old EKG Reviewed: changes noted    Labs Reviewed  URINALYSIS, ROUTINE W REFLEX MICROSCOPIC - Abnormal; Notable for the following:    APPearance CLOUDY (*)     All other components within normal limits  URINE CULTURE   Dg Chest 2 View  01/11/2012  *RADIOLOGY REPORT*  Clinical Data: Bronchitis  CHEST - 2 VIEW  Comparison: CT 09/09/05, 04/12/2005 chest radiograph  Findings: Cardiac leads obscure detail. Diffusely increased interstitial lung markings noted, without focal pulmonary opacity. No focal pulmonary opacity.  Heart size is normal.  Trace if any pleural fluid.  No acute osseous finding. Central bronchial wall thickening noted.  IMPRESSION: Diffusely prominent interstitial markings and central bronchial wall thickening which may suggest bronchitis or atypical viral / infectious etiologies.  No new focal opacity.  Original Report Authenticated By: Harrel Lemon, M.D.   Dg Abd 2 Views  01/11/2012  *RADIOLOGY REPORT*  Clinical Data: Left-sided abdominal and flank pain.  ABDOMEN - 2 VIEW  Comparison: CT 10/01/2006, chest radiograph today  Findings: Prominent interstitial markings are again noted. No free air beneath the diaphragms.  Normal bowel gas pattern.  No air fluid level.  No abnormal calcific opacity.  Right pelvic phleboliths noted.  IMPRESSION: No acute intra-abdominal pathology.  Normal bowel gas pattern.  Original Report Authenticated By: Harrel Lemon, M.D.     1. Flank pain       MDM  Nonspecific short term  abdominal pain, without evidence for significant illness. Doubt metabolic instability, serious bacterial infection or impending vascular collapse; the patient is stable for discharge.   ,Plan: Home Medications- Percocet and Colace; Home Treatments- rest; Recommended follow up- PCP in 3 days and prn        Flint Melter, MD 01/11/12 2343

## 2012-01-11 NOTE — ED Notes (Signed)
Pt states she has been having flank pain on the left side for about 2 weeks  Pt states the pain has progressively gotten worse  Pt states she is having muscle spasms every time she moves  Pt states she has been having these for about the same time frame  Denies urinary sxs   Pt wears home oxygen  Pt placed on 2 liters via [place] in triage due to low sats

## 2012-01-11 NOTE — ED Notes (Signed)
Pt with left sided flank pain for 2 weeks.  She denies nausea, vomiting, or dysuria.  Pt was diagnosed with bronchitis on Thursday and finished taking a z-pack last night.  Also c/o lower back pain.

## 2012-01-12 LAB — URINE CULTURE

## 2012-01-15 ENCOUNTER — Other Ambulatory Visit: Payer: Self-pay | Admitting: *Deleted

## 2012-01-15 DIAGNOSIS — E785 Hyperlipidemia, unspecified: Secondary | ICD-10-CM

## 2012-01-15 MED ORDER — PRAVASTATIN SODIUM 20 MG PO TABS: 20.0000 mg | ORAL_TABLET | Freq: Every day | ORAL | Status: DC

## 2012-02-19 ENCOUNTER — Other Ambulatory Visit: Payer: Self-pay | Admitting: *Deleted

## 2012-02-19 DIAGNOSIS — J45909 Unspecified asthma, uncomplicated: Secondary | ICD-10-CM

## 2012-02-19 MED ORDER — FLUTICASONE-SALMETEROL 500-50 MCG/DOSE IN AEPB: 1.0000 | INHALATION_SPRAY | Freq: Two times a day (BID) | RESPIRATORY_TRACT | Status: DC

## 2012-03-17 ENCOUNTER — Other Ambulatory Visit: Payer: Medicaid Other

## 2012-03-18 ENCOUNTER — Other Ambulatory Visit: Payer: Self-pay | Admitting: Infectious Disease

## 2012-03-29 ENCOUNTER — Other Ambulatory Visit: Payer: Medicaid Other

## 2012-03-31 ENCOUNTER — Other Ambulatory Visit: Payer: Medicaid Other

## 2012-03-31 ENCOUNTER — Ambulatory Visit: Payer: Medicaid Other | Admitting: Internal Medicine

## 2012-03-31 DIAGNOSIS — B20 Human immunodeficiency virus [HIV] disease: Secondary | ICD-10-CM

## 2012-04-01 LAB — COMPLETE METABOLIC PANEL WITH GFR
Albumin: 4.4 g/dL (ref 3.5–5.2)
Alkaline Phosphatase: 80 U/L (ref 39–117)
BUN: 22 mg/dL (ref 6–23)
CO2: 27 mEq/L (ref 19–32)
Calcium: 9.1 mg/dL (ref 8.4–10.5)
Chloride: 104 mEq/L (ref 96–112)
GFR, Est African American: 47 mL/min — ABNORMAL LOW
GFR, Est Non African American: 40 mL/min — ABNORMAL LOW
Glucose, Bld: 111 mg/dL — ABNORMAL HIGH (ref 70–99)
Potassium: 3.9 mEq/L (ref 3.5–5.3)
Sodium: 139 mEq/L (ref 135–145)
Total Protein: 7.4 g/dL (ref 6.0–8.3)

## 2012-04-01 LAB — T-HELPER CELL (CD4) - (RCID CLINIC ONLY): CD4 T Cell Abs: 1610 uL (ref 400–2700)

## 2012-04-12 ENCOUNTER — Other Ambulatory Visit: Payer: Self-pay | Admitting: Infectious Disease

## 2012-04-12 ENCOUNTER — Ambulatory Visit: Payer: Medicaid Other | Admitting: Internal Medicine

## 2012-04-12 ENCOUNTER — Ambulatory Visit (INDEPENDENT_AMBULATORY_CARE_PROVIDER_SITE_OTHER): Payer: Medicaid Other | Admitting: *Deleted

## 2012-04-12 DIAGNOSIS — Z23 Encounter for immunization: Secondary | ICD-10-CM

## 2012-04-28 ENCOUNTER — Ambulatory Visit: Payer: Medicaid Other | Admitting: Internal Medicine

## 2012-05-02 ENCOUNTER — Ambulatory Visit: Payer: Medicaid Other | Admitting: Internal Medicine

## 2012-05-04 ENCOUNTER — Ambulatory Visit: Payer: Medicaid Other | Admitting: Internal Medicine

## 2012-05-05 ENCOUNTER — Other Ambulatory Visit: Payer: Self-pay | Admitting: Internal Medicine

## 2012-05-05 ENCOUNTER — Ambulatory Visit (INDEPENDENT_AMBULATORY_CARE_PROVIDER_SITE_OTHER): Payer: Medicaid Other | Admitting: Internal Medicine

## 2012-05-05 ENCOUNTER — Ambulatory Visit: Payer: Medicaid Other | Admitting: Internal Medicine

## 2012-05-05 ENCOUNTER — Encounter: Payer: Self-pay | Admitting: Internal Medicine

## 2012-05-05 VITALS — BP 157/88 | HR 106 | Temp 98.0°F | Ht 69.0 in | Wt 238.8 lb

## 2012-05-05 DIAGNOSIS — N289 Disorder of kidney and ureter, unspecified: Secondary | ICD-10-CM | POA: Insufficient documentation

## 2012-05-05 DIAGNOSIS — E669 Obesity, unspecified: Secondary | ICD-10-CM | POA: Insufficient documentation

## 2012-05-05 DIAGNOSIS — R739 Hyperglycemia, unspecified: Secondary | ICD-10-CM | POA: Insufficient documentation

## 2012-05-05 DIAGNOSIS — R7309 Other abnormal glucose: Secondary | ICD-10-CM

## 2012-05-05 NOTE — Progress Notes (Signed)
Patient ID: [name], female   DOB: [birth year], 66 y.o.   MRN: [id number]     [facility] for Infectious Disease  Patient Active Problem List  Diagnosis  . HIV DISEASE  . DEPRESSION  . BREAST MASS, BENIGN  . OSTEOPOROSIS  . ALLERGIC RHINITIS, SEASONAL  . COPD  . Hyperlipidemia  . Hypertension  . Asthma  . Cigarette smoker  . Obesity  . Renal insufficiency  . Hyperglycemia    Patient's Medications  New Prescriptions   No medications on file  Previous Medications   ALBUTEROL (PROVENTIL HFA;VENTOLIN HFA) 108 (90 BASE) MCG/ACT INHALER    Inhale 1 puff into the lungs 2 (two) times daily.   ALBUTEROL (PROVENTIL) (2.5 MG/3ML) 0.083% NEBULIZER SOLUTION    Take 2.5 mg by nebulization every 4 (four) hours as needed. Wheezing or shortness of breath.   ASPIRIN EC 81 MG TABLET    Take 81 mg by mouth daily.   EFAVIRENZ-EMTRICITABINE-TENOFOVIR (ATRIPLA) 600-200-300 MG PER TABLET    Take 1 tablet by mouth at bedtime.   FLUTICASONE-SALMETEROL (ADVAIR) 500-50 MCG/DOSE AEPB    Inhale 1 puff into the lungs every 12 (twelve) hours.   GABAPENTIN (NEURONTIN) 300 MG CAPSULE    Take 300 mg by mouth 3 (three) times daily.   LORATADINE (CLARITIN) 10 MG TABLET    Take 10 mg by mouth daily.   MONTELUKAST (SINGULAIR) 10 MG TABLET    Take 10 mg by mouth at bedtime.   OXYCODONE-ACETAMINOPHEN (PERCOCET) 5-325 MG PER TABLET    Take 1 tablet by mouth every 8 (eight) hours as needed. Pain.   PRAVASTATIN (PRAVACHOL) 20 MG TABLET    Take 1 tablet (20 mg total) by mouth daily.   QUETIAPINE FUMARATE (SEROQUEL XR) 150 MG 24 HR TABLET    Take 150 mg by mouth at bedtime.   VENLAFAXINE XR (EFFEXOR-XR) 150 MG 24 HR CAPSULE    Take 150 mg by mouth daily.  Modified Medications   No medications on file  Discontinued Medications   SEROQUEL XR 150 MG 24 HR TABLET    TAKE 1 TABLET BY MOUTH EVERY NIGHT AT BEDTIME    Subjective: [name] is in for her routine visit. She denies missing a single dose of her Atripla  since her last visit. She states that she is trying to quit smoking cigarettes but does not have a current plan to quit.  Objective: Temp: 98 F (36.7 C) (11/07 1101) Temp src: Oral (11/07 1101) BP: 157/88 mmHg (11/07 1101) Pulse Rate: 106  (11/07 1101)  General: She is in good spirits. Her body mass index is just over 35 Skin: No rash Oral: Edentulous Lungs: Clear Cor: Regular S1-S2 no murmurs Abdomen: Obese, soft and nontender  Lab Results HIV 1 RNA Quant (copies/mL)  Date Value  03/31/2012 <20   12/03/2011 38*  11/27/2010 31*     CD4 T Cell Abs (cmm)  Date Value  03/31/2012 1610   12/03/2011 720   11/27/2010 1100      Lab Results  Component Value Date   CHOL 179 12/03/2011   HDL 48 12/03/2011   LDLCALC 87 12/03/2011   TRIG 220* 12/03/2011   CHOLHDL 3.7 12/03/2011    BMET    Component Value Date/Time   NA 139 03/31/2012 1437   K 3.9 03/31/2012 1437   CL 104 03/31/2012 1437   CO2 27 03/31/2012 1437   GLUCOSE 111* 03/31/2012 1437   BUN 22 03/31/2012 1437   CREATININE 1.38* 03/31/2012  1437   CREATININE 1.16 06/16/2010 2059   CALCIUM 9.1 03/31/2012 1437     Assessment: Her HIV infection is under excellent control. I will continue Atripla for now but will have her return in 6 weeks to repeat her creatinine and GFR. If her GFR is below 50 will need to change Atripla to is renally dosed components.  She has borderline hypertension, morbid obesity, hyperglycemia and hypertriglyceridemia. I've asked her to followup with Dr. Melven Sartorius, her primary care physician.  I encouraged her to go through with the plan to quit smoking cigarettes. I have given her written him from patient about cigarette cessation and the [place] quit line.  Plan: 1. Continue Atripla for now 2. Repeat creatinine and GFR in 6 weeks 3. Followup with her primary care physician 4. Cigarette cessation counseling 5. Return in 2 months   Cliffton Asters, MD [facility] for Infectious Disease [facility] [phone] pager   [phone] cell 05/05/2012, 11:19 AM

## 2012-05-06 ENCOUNTER — Other Ambulatory Visit: Payer: Self-pay | Admitting: Licensed Clinical Social Worker

## 2012-05-06 DIAGNOSIS — J45909 Unspecified asthma, uncomplicated: Secondary | ICD-10-CM

## 2012-05-06 MED ORDER — ALBUTEROL SULFATE HFA 108 (90 BASE) MCG/ACT IN AERS: 1.0000 | INHALATION_SPRAY | Freq: Two times a day (BID) | RESPIRATORY_TRACT | Status: DC

## 2012-05-13 ENCOUNTER — Other Ambulatory Visit: Payer: Self-pay | Admitting: Licensed Clinical Social Worker

## 2012-05-13 DIAGNOSIS — B20 Human immunodeficiency virus [HIV] disease: Secondary | ICD-10-CM

## 2012-05-13 MED ORDER — EFAVIRENZ-EMTRICITAB-TENOFOVIR 600-200-300 MG PO TABS: 1.0000 | ORAL_TABLET | Freq: Every day | ORAL | Status: DC

## 2012-06-08 ENCOUNTER — Telehealth: Payer: Self-pay | Admitting: *Deleted

## 2012-06-08 NOTE — Telephone Encounter (Signed)
C/O knees/back hurting.  Requesting pain medication be called in for her.  RN advised the pt that pain medication can not be called in for the patient has been evaluated and that Dr. Orvan Falconer is her HIV MD.  The pt has PCP and RN recommended that she call her PCP for an appt.  Pt stated that she had called her PCP and can be seen on Friday.  RN advised that she make that appt to be seen for this issue.  Pt verbalized understanding.

## 2012-06-14 ENCOUNTER — Other Ambulatory Visit: Payer: Self-pay | Admitting: *Deleted

## 2012-06-14 DIAGNOSIS — J45909 Unspecified asthma, uncomplicated: Secondary | ICD-10-CM

## 2012-06-14 MED ORDER — ALBUTEROL SULFATE (2.5 MG/3ML) 0.083% IN NEBU: 2.5000 mg | INHALATION_SOLUTION | RESPIRATORY_TRACT | Status: DC | PRN

## 2012-06-16 ENCOUNTER — Other Ambulatory Visit: Payer: Medicaid Other

## 2012-07-04 ENCOUNTER — Other Ambulatory Visit: Payer: Medicaid Other

## 2012-07-04 DIAGNOSIS — N289 Disorder of kidney and ureter, unspecified: Secondary | ICD-10-CM

## 2012-07-05 LAB — BASIC METABOLIC PANEL WITH GFR
BUN: 15 mg/dL (ref 6–23)
Creat: 1.16 mg/dL — ABNORMAL HIGH (ref 0.50–1.10)
GFR, Est African American: 57 mL/min — ABNORMAL LOW
Glucose, Bld: 112 mg/dL — ABNORMAL HIGH (ref 70–99)
Potassium: 4 mEq/L (ref 3.5–5.3)

## 2012-07-06 ENCOUNTER — Ambulatory Visit: Payer: Medicaid Other | Admitting: Internal Medicine

## 2012-07-08 ENCOUNTER — Other Ambulatory Visit: Payer: Self-pay | Admitting: Internal Medicine

## 2012-07-18 ENCOUNTER — Encounter: Payer: Self-pay | Admitting: Internal Medicine

## 2012-07-18 ENCOUNTER — Ambulatory Visit (INDEPENDENT_AMBULATORY_CARE_PROVIDER_SITE_OTHER): Payer: Medicaid Other | Admitting: Internal Medicine

## 2012-07-18 VITALS — BP 132/74 | HR 94 | Temp 97.6°F | Ht 69.0 in | Wt 222.0 lb

## 2012-07-18 DIAGNOSIS — B20 Human immunodeficiency virus [HIV] disease: Secondary | ICD-10-CM

## 2012-07-18 NOTE — Progress Notes (Signed)
Patient ID: [name], female   DOB: 05-24-1946, 67 y.o.   MRN: [id number]     [facility] for Infectious Disease  Patient Active Problem List  Diagnosis  . HIV DISEASE  . DEPRESSION  . BREAST MASS, BENIGN  . OSTEOPOROSIS  . ALLERGIC RHINITIS, SEASONAL  . COPD  . Hyperlipidemia  . Hypertension  . Asthma  . Cigarette smoker  . Obesity  . Renal insufficiency  . Hyperglycemia    Patient's Medications  New Prescriptions   No medications on file  Previous Medications   ALBUTEROL (PROVENTIL HFA;VENTOLIN HFA) 108 (90 BASE) MCG/ACT INHALER    Inhale 1 puff into the lungs 2 (two) times daily.   ALBUTEROL (PROVENTIL) (2.5 MG/3ML) 0.083% NEBULIZER SOLUTION    Take 3 mLs (2.5 mg total) by nebulization every 4 (four) hours as needed for wheezing or shortness of breath.   ASPIRIN EC 81 MG TABLET    Take 81 mg by mouth daily.   EFAVIRENZ-EMTRICITABINE-TENOFOVIR (ATRIPLA) 600-200-300 MG PER TABLET    Take 1 tablet by mouth at bedtime.   FLUTICASONE-SALMETEROL (ADVAIR) 500-50 MCG/DOSE AEPB    Inhale 1 puff into the lungs every 12 (twelve) hours.   GABAPENTIN (NEURONTIN) 300 MG CAPSULE    Take 300 mg by mouth 3 (three) times daily.   LORATADINE (CLARITIN) 10 MG TABLET    Take 10 mg by mouth daily.   MONTELUKAST (SINGULAIR) 10 MG TABLET    Take 10 mg by mouth at bedtime.   OXYCODONE-ACETAMINOPHEN (PERCOCET) 5-325 MG PER TABLET    Take 1 tablet by mouth every 8 (eight) hours as needed. Pain.   PRAVASTATIN (PRAVACHOL) 20 MG TABLET    TAKE 1 TABLET BY MOUTH EVERY DAY   QUETIAPINE FUMARATE (SEROQUEL XR) 150 MG 24 HR TABLET    Take 150 mg by mouth at bedtime.   VENLAFAXINE XR (EFFEXOR-XR) 150 MG 24 HR CAPSULE    Take 150 mg by mouth daily.  Modified Medications   No medications on file  Discontinued Medications   GABAPENTIN (NEURONTIN) 300 MG CAPSULE    TAKE 1 CAPSULE BY MOUTH THREE TIMES A DAY   VENLAFAXINE XR (EFFEXOR-XR) 150 MG 24 HR CAPSULE    TAKE 1 CAPSULE BY MOUTH DAILY     Subjective: [name] is in for her routine visit. As usual, she denies missing any doses of her Atripla since her last visit here. She continues to struggle with her cigarettes and wants to quit. She has not called to quit line yet. She has been fasting occasionally and eating smaller portions and healthier foods so that she can lose weight.  Objective: Temp: 97.6 F (36.4 C) (01/20 1432) Temp src: Oral (01/20 1432) BP: 132/74 mmHg (01/20 1432) Pulse Rate: 94  (01/20 1432)  General: Her weight is down from 238 pounds to 222  Lab Results HIV 1 RNA Quant (copies/mL)  Date Value  03/31/2012 <20   12/03/2011 38*  11/27/2010 31*     CD4 T Cell Abs (cmm)  Date Value  03/31/2012 1610   12/03/2011 720   11/27/2010 1100      Assessment: Her HIV infection is under very good control as of her lab work last October. Her appearance is very good and her viral load is back to undetectable.  Plan: 1. Continue Atripla 2. Encouraged her to call the [place] quit line today 3. Followup after lab work in 3 months   Cliffton Asters, MD [facility] for Infectious Disease [facility] [phone] pager   [phone] cell 07/18/2012, 3:11 PM

## 2012-08-10 ENCOUNTER — Other Ambulatory Visit: Payer: Self-pay | Admitting: *Deleted

## 2012-08-10 DIAGNOSIS — J45909 Unspecified asthma, uncomplicated: Secondary | ICD-10-CM

## 2012-08-10 MED ORDER — ALBUTEROL SULFATE HFA 108 (90 BASE) MCG/ACT IN AERS: 1.0000 | INHALATION_SPRAY | Freq: Two times a day (BID) | RESPIRATORY_TRACT | Status: DC

## 2012-09-05 ENCOUNTER — Other Ambulatory Visit: Payer: Self-pay | Admitting: Internal Medicine

## 2012-10-04 ENCOUNTER — Other Ambulatory Visit: Payer: Self-pay | Admitting: Licensed Clinical Social Worker

## 2012-10-04 DIAGNOSIS — J45909 Unspecified asthma, uncomplicated: Secondary | ICD-10-CM

## 2012-10-04 MED ORDER — ALBUTEROL SULFATE (2.5 MG/3ML) 0.083% IN NEBU: 2.5000 mg | INHALATION_SOLUTION | RESPIRATORY_TRACT | Status: DC | PRN

## 2012-10-17 ENCOUNTER — Other Ambulatory Visit: Payer: Medicaid Other

## 2012-10-28 ENCOUNTER — Emergency Department (HOSPITAL_COMMUNITY)
Admission: EM | Admit: 2012-10-28 | Discharge: 2012-10-28 | Disposition: A | Discharge status: Home / Self Care | Payer: Medicaid Other | Attending: Emergency Medicine | Admitting: Emergency Medicine

## 2012-10-28 ENCOUNTER — Encounter (HOSPITAL_COMMUNITY): Payer: Self-pay | Admitting: *Deleted

## 2012-10-28 DIAGNOSIS — M545 Low back pain, unspecified: Secondary | ICD-10-CM | POA: Insufficient documentation

## 2012-10-28 DIAGNOSIS — N189 Chronic kidney disease, unspecified: Secondary | ICD-10-CM | POA: Insufficient documentation

## 2012-10-28 DIAGNOSIS — Z79899 Other long term (current) drug therapy: Secondary | ICD-10-CM | POA: Insufficient documentation

## 2012-10-28 DIAGNOSIS — N898 Other specified noninflammatory disorders of vagina: Secondary | ICD-10-CM | POA: Insufficient documentation

## 2012-10-28 DIAGNOSIS — E785 Hyperlipidemia, unspecified: Secondary | ICD-10-CM | POA: Insufficient documentation

## 2012-10-28 DIAGNOSIS — J449 Chronic obstructive pulmonary disease, unspecified: Secondary | ICD-10-CM | POA: Insufficient documentation

## 2012-10-28 DIAGNOSIS — F17200 Nicotine dependence, unspecified, uncomplicated: Secondary | ICD-10-CM | POA: Insufficient documentation

## 2012-10-28 DIAGNOSIS — IMO0002 Reserved for concepts with insufficient information to code with codable children: Secondary | ICD-10-CM | POA: Insufficient documentation

## 2012-10-28 DIAGNOSIS — Z21 Asymptomatic human immunodeficiency virus [HIV] infection status: Secondary | ICD-10-CM | POA: Insufficient documentation

## 2012-10-28 DIAGNOSIS — N390 Urinary tract infection, site not specified: Secondary | ICD-10-CM | POA: Insufficient documentation

## 2012-10-28 DIAGNOSIS — J4489 Other specified chronic obstructive pulmonary disease: Secondary | ICD-10-CM | POA: Insufficient documentation

## 2012-10-28 DIAGNOSIS — Z7982 Long term (current) use of aspirin: Secondary | ICD-10-CM | POA: Insufficient documentation

## 2012-10-28 LAB — POCT I-STAT, CHEM 8
Chloride: 105 mEq/L (ref 96–112)
Glucose, Bld: 89 mg/dL (ref 70–99)
HCT: 48 % — ABNORMAL HIGH (ref 36.0–46.0)
Potassium: 3.8 mEq/L (ref 3.5–5.1)

## 2012-10-28 LAB — URINALYSIS, ROUTINE W REFLEX MICROSCOPIC
Bilirubin Urine: NEGATIVE
Ketones, ur: NEGATIVE mg/dL
Specific Gravity, Urine: 1.022 (ref 1.005–1.030)
Urobilinogen, UA: 1 mg/dL (ref 0.0–1.0)
pH: 5.5 (ref 5.0–8.0)

## 2012-10-28 LAB — URINE MICROSCOPIC-ADD ON

## 2012-10-28 LAB — WET PREP, GENITAL

## 2012-10-28 MED ORDER — PHENAZOPYRIDINE HCL 200 MG PO TABS: 200.0000 mg | ORAL_TABLET | Freq: Three times a day (TID) | ORAL | Status: DC

## 2012-10-28 MED ORDER — PHENAZOPYRIDINE HCL 200 MG PO TABS
200.0000 mg | ORAL_TABLET | Freq: Three times a day (TID) | ORAL | Status: DC
  Filled 2012-10-28: qty 1

## 2012-10-28 MED ORDER — PHENAZOPYRIDINE HCL 200 MG PO TABS
200.0000 mg | ORAL_TABLET | Freq: Three times a day (TID) | ORAL | Status: DC
  Administered 2012-10-28: 200 mg via ORAL

## 2012-10-28 MED ORDER — SULFAMETHOXAZOLE-TMP DS 800-160 MG PO TABS
1.0000 | ORAL_TABLET | Freq: Once | ORAL | Status: AC
  Administered 2012-10-28: 1 via ORAL
  Filled 2012-10-28: qty 1

## 2012-10-28 MED ORDER — SULFAMETHOXAZOLE-TRIMETHOPRIM 800-160 MG PO TABS: 1.0000 | ORAL_TABLET | Freq: Two times a day (BID) | ORAL | Status: DC

## 2012-10-28 NOTE — ED Provider Notes (Signed)
History     CSN: [id number]  Arrival date & time 10/28/12  1712   First MD Initiated Contact with Patient 10/28/12 1735      Chief Complaint  Patient presents with  . Dysuria  . Vaginal Bleeding    (Consider location/radiation/quality/duration/timing/severity/associated sxs/prior treatment) Patient is a 67 y.o. female presenting with dysuria and vaginal bleeding.  Dysuria   Vaginal Bleeding   Complains of burning with urination or urethral meatus and low back pain onset 5 days ago. Patient also complains of vaginal bleeding since having sexual intercourse 6 days ago patient reports heavy bleeding for 2 days which has since slowed down to occasional spotting. No fever no vomiting no other complaint.. No other associated symptoms. No treatment prior to coming here Past Medical History  Diagnosis Date  . COPD (chronic obstructive pulmonary disease)   . Hyperlipidemia   . HIV (human immunodeficiency virus infection)    abnormal cervica lcells  Past Surgical History  Procedure Laterality Date  . Abcess drainage      Family History  Problem Relation Age of Onset  . Pneumonia Mother   . Cancer Sister   . Hypertension Sister   . Diabetes Sister     History  Substance Use Topics  . Smoking status: Current Every Day Smoker -- 1.00 packs/day    Types: Cigarettes  . Smokeless tobacco: Never Used  . Alcohol Use: No    OB History   Grav Para Term Preterm Abortions TAB SAB Ect Mult Living                  Review of Systems  Constitutional: Negative.   HENT: Negative.   Respiratory: Negative.   Cardiovascular: Negative.   Gastrointestinal: Negative.   Genitourinary: Positive for dysuria and vaginal bleeding.  Musculoskeletal: Negative.   Skin: Negative.   Neurological: Negative.   Psychiatric/Behavioral: Negative.   All other systems reviewed and are negative.    Allergies  Review of patient's allergies indicates no known allergies.  Home Medications    Current Outpatient Rx  Name  Route  Sig  Dispense  Refill  . albuterol (PROVENTIL HFA;VENTOLIN HFA) 108 (90 BASE) MCG/ACT inhaler   Inhalation   Inhale 1 puff into the lungs 2 (two) times daily.   1 Inhaler   3   . albuterol (PROVENTIL) (2.5 MG/3ML) 0.083% nebulizer solution   Nebulization   Take 3 mLs (2.5 mg total) by nebulization every 4 (four) hours as needed for wheezing or shortness of breath.   75 mL   5   . aspirin EC 81 MG tablet   Oral   Take 81 mg by mouth daily.         . calcium carbonate (OS-CAL - DOSED IN MG OF ELEMENTAL CALCIUM) 1250 MG tablet   Oral   Take 1 tablet by mouth daily.         Marland Kitchen efavirenz-emtricitabine-tenofovir (ATRIPLA) 600-200-300 MG per tablet   Oral   Take 1 tablet by mouth at bedtime.         . Fluticasone-Salmeterol (ADVAIR) 500-50 MCG/DOSE AEPB   Inhalation   Inhale 1 puff into the lungs every 12 (twelve) hours.         . gabapentin (NEURONTIN) 300 MG capsule   Oral   Take 300 mg by mouth 3 (three) times daily.         Marland Kitchen loratadine (CLARITIN) 10 MG tablet   Oral   Take 10 mg by mouth daily.         Marland Kitchen  montelukast (SINGULAIR) 10 MG tablet   Oral   Take 10 mg by mouth at bedtime.         . pravastatin (PRAVACHOL) 20 MG tablet   Oral   Take 20 mg by mouth daily.         . QUEtiapine Fumarate (SEROQUEL XR) 150 MG 24 hr tablet   Oral   Take 150 mg by mouth at bedtime.         Marland Kitchen venlafaxine XR (EFFEXOR-XR) 150 MG 24 hr capsule   Oral   Take 150 mg by mouth daily.         . vitamin B-12 (CYANOCOBALAMIN) 250 MCG tablet   Oral   Take 250 mcg by mouth daily.           BP 128/67  Pulse 107  Temp(Src) 99 F (37.2 C)  Resp 20  SpO2 94%  Physical Exam  Nursing note and vitals reviewed. Constitutional: She appears well-developed and well-nourished.  HENT:  Head: Normocephalic and atraumatic.  Eyes: Conjunctivae are normal. Pupils are equal, round, and reactive to light.  Neck: Neck supple. No  tracheal deviation present. No thyromegaly present.  Cardiovascular: Normal rate and regular rhythm.   No murmur heard. Pulmonary/Chest: Effort normal and breath sounds normal.  Abdominal: Soft. Bowel sounds are normal. She exhibits no distension. There is no tenderness.  Genitourinary:  No external lesion slight amount cream-colored discharge in vault no vaginal bleeding. Cervical os closed no cervical motion tenderness no adnexal masses or tenderness  Musculoskeletal: Normal range of motion. She exhibits no edema and no tenderness.  Neurological: She is alert. Coordination normal.  Skin: Skin is warm and dry. No rash noted.  Psychiatric: She has a normal mood and affect.    ED Course  Procedures (including critical care time)  Labs Reviewed  WET PREP, GENITAL - Abnormal; Notable for the following:    Clue Cells Wet Prep HPF POC FEW (*)    WBC, Wet Prep HPF POC FEW (*)    All other components within normal limits  URINALYSIS, ROUTINE W REFLEX MICROSCOPIC - Abnormal; Notable for the following:    APPearance TURBID (*)    Hgb urine dipstick LARGE (*)    Protein, ur >300 (*)    Nitrite POSITIVE (*)    Leukocytes, UA LARGE (*)    All other components within normal limits  URINE MICROSCOPIC-ADD ON - Abnormal; Notable for the following:    Squamous Epithelial / LPF FEW (*)    Bacteria, UA FEW (*)    All other components within normal limits  POCT I-STAT, CHEM 8 - Abnormal; Notable for the following:    Creatinine, Ser 1.30 (*)    Calcium, Ion 1.12 (*)    Hemoglobin 16.3 (*)    HCT 48.0 (*)    All other components within normal limits  URINE CULTURE  GC/CHLAMYDIA PROBE AMP   No results found.   No diagnosis found.  Results for orders placed during the hospital encounter of 10/28/12  WET PREP, GENITAL      Result Value Range   Yeast Wet Prep HPF POC NONE SEEN  NONE SEEN   Trich, Wet Prep NONE SEEN  NONE SEEN   Clue Cells Wet Prep HPF POC FEW (*) NONE SEEN   WBC, Wet Prep  HPF POC FEW (*) NONE SEEN  URINALYSIS, ROUTINE W REFLEX MICROSCOPIC      Result Value Range   Color, Urine YELLOW  YELLOW   APPearance  TURBID (*) CLEAR   Specific Gravity, Urine 1.022  1.005 - 1.030   pH 5.5  5.0 - 8.0   Glucose, UA NEGATIVE  NEGATIVE mg/dL   Hgb urine dipstick LARGE (*) NEGATIVE   Bilirubin Urine NEGATIVE  NEGATIVE   Ketones, ur NEGATIVE  NEGATIVE mg/dL   Protein, ur >119 (*) NEGATIVE mg/dL   Urobilinogen, UA 1.0  0.0 - 1.0 mg/dL   Nitrite POSITIVE (*) NEGATIVE   Leukocytes, UA LARGE (*) NEGATIVE  URINE MICROSCOPIC-ADD ON      Result Value Range   Squamous Epithelial / LPF FEW (*) RARE   WBC, UA TOO NUMEROUS TO COUNT  <3 WBC/hpf   RBC / HPF 7-10  <3 RBC/hpf   Bacteria, UA FEW (*) RARE   Urine-Other MUCOUS PRESENT    POCT I-STAT, CHEM 8      Result Value Range   Sodium 144  135 - 145 mEq/L   Potassium 3.8  3.5 - 5.1 mEq/L   Chloride 105  96 - 112 mEq/L   BUN 20  6 - 23 mg/dL   Creatinine, Ser 1.47 (*) 0.50 - 1.10 mg/dL   Glucose, Bld 89  70 - 99 mg/dL   Calcium, Ion 8.29 (*) 1.13 - 1.30 mmol/L   TCO2 29  0 - 100 mmol/L   Hemoglobin 16.3 (*) 12.0 - 15.0 g/dL   HCT 56.2 (*) 13.0 - 86.5 %   No results found.   MDM  Plan prescription Bactrim DS, Pyridium urine sent for culture You have scheduled appointment with gynecologist 11/07/2012 Diagnosis #1 urinary tract infection #2 chronic renal insufficiency #3 abnormal vaginal to        Doug Sou, MD 10/28/12 1951

## 2012-10-28 NOTE — ED Notes (Signed)
Pt c/o vaginal spotting and pain with urination since Saturday. States "I have been diagnosed with abnormal cells on my cervix, I've had them before and now they are back."

## 2012-10-29 LAB — GC/CHLAMYDIA PROBE AMP: CT Probe RNA: NEGATIVE

## 2012-11-01 ENCOUNTER — Ambulatory Visit: Payer: Medicaid Other | Admitting: Internal Medicine

## 2012-11-01 LAB — URINE CULTURE

## 2012-11-02 DIAGNOSIS — Z9109 Other allergy status, other than to drugs and biological substances: Secondary | ICD-10-CM | POA: Insufficient documentation

## 2012-11-02 NOTE — ED Notes (Signed)
Post ED Visit - Positive Culture Follow-up  Culture report reviewed by antimicrobial stewardship pharmacist: []  Wes Dulaney, Pharm.D., BCPS [x]  Celedonio Miyamoto, Pharm.D., BCPS []  Georgina Pillion, Pharm.D., BCPS []  [place], [address].D., BCPS, AAHIVP []  Estella Husk, Pharm.D., BCPS, AAHIV  Positive urine culture Treated with Bactrim- organism sensitive to the same and no further patient follow-up is required at this time.  Larena Sox 11/02/2012, 4:27 PM

## 2012-11-19 ENCOUNTER — Emergency Department (HOSPITAL_COMMUNITY): Payer: Medicare Other

## 2012-11-19 ENCOUNTER — Encounter (HOSPITAL_COMMUNITY): Payer: Self-pay

## 2012-11-19 ENCOUNTER — Observation Stay (HOSPITAL_COMMUNITY)
Admission: EM | Admit: 2012-11-19 | Discharge: 2012-11-21 | Disposition: A | Discharge status: Home / Self Care | Payer: Medicare Other | Attending: Family Medicine | Admitting: Family Medicine

## 2012-11-19 DIAGNOSIS — J301 Allergic rhinitis due to pollen: Secondary | ICD-10-CM

## 2012-11-19 DIAGNOSIS — Z7982 Long term (current) use of aspirin: Secondary | ICD-10-CM | POA: Insufficient documentation

## 2012-11-19 DIAGNOSIS — Z87891 Personal history of nicotine dependence: Secondary | ICD-10-CM | POA: Insufficient documentation

## 2012-11-19 DIAGNOSIS — F329 Major depressive disorder, single episode, unspecified: Secondary | ICD-10-CM

## 2012-11-19 DIAGNOSIS — E669 Obesity, unspecified: Secondary | ICD-10-CM

## 2012-11-19 DIAGNOSIS — J45909 Unspecified asthma, uncomplicated: Secondary | ICD-10-CM | POA: Insufficient documentation

## 2012-11-19 DIAGNOSIS — Z79899 Other long term (current) drug therapy: Secondary | ICD-10-CM | POA: Insufficient documentation

## 2012-11-19 DIAGNOSIS — I10 Essential (primary) hypertension: Secondary | ICD-10-CM | POA: Insufficient documentation

## 2012-11-19 DIAGNOSIS — E876 Hypokalemia: Secondary | ICD-10-CM | POA: Insufficient documentation

## 2012-11-19 DIAGNOSIS — J449 Chronic obstructive pulmonary disease, unspecified: Secondary | ICD-10-CM

## 2012-11-19 DIAGNOSIS — N289 Disorder of kidney and ureter, unspecified: Secondary | ICD-10-CM

## 2012-11-19 DIAGNOSIS — F17210 Nicotine dependence, cigarettes, uncomplicated: Secondary | ICD-10-CM

## 2012-11-19 DIAGNOSIS — K625 Hemorrhage of anus and rectum: Secondary | ICD-10-CM | POA: Insufficient documentation

## 2012-11-19 DIAGNOSIS — N63 Unspecified lump in unspecified breast: Secondary | ICD-10-CM

## 2012-11-19 DIAGNOSIS — R739 Hyperglycemia, unspecified: Secondary | ICD-10-CM

## 2012-11-19 DIAGNOSIS — E785 Hyperlipidemia, unspecified: Secondary | ICD-10-CM | POA: Insufficient documentation

## 2012-11-19 DIAGNOSIS — F3289 Other specified depressive episodes: Secondary | ICD-10-CM

## 2012-11-19 DIAGNOSIS — M810 Age-related osteoporosis without current pathological fracture: Secondary | ICD-10-CM

## 2012-11-19 DIAGNOSIS — N83209 Unspecified ovarian cyst, unspecified side: Secondary | ICD-10-CM | POA: Insufficient documentation

## 2012-11-19 DIAGNOSIS — K921 Melena: Secondary | ICD-10-CM

## 2012-11-19 DIAGNOSIS — B20 Human immunodeficiency virus [HIV] disease: Principal | ICD-10-CM | POA: Insufficient documentation

## 2012-11-19 DIAGNOSIS — K5289 Other specified noninfective gastroenteritis and colitis: Secondary | ICD-10-CM | POA: Insufficient documentation

## 2012-11-19 DIAGNOSIS — R109 Unspecified abdominal pain: Secondary | ICD-10-CM

## 2012-11-19 DIAGNOSIS — R710 Precipitous drop in hematocrit: Secondary | ICD-10-CM | POA: Insufficient documentation

## 2012-11-19 LAB — BASIC METABOLIC PANEL
BUN: 12 mg/dL (ref 6–23)
CO2: 27 mEq/L (ref 19–32)
Chloride: 103 mEq/L (ref 96–112)
Creatinine, Ser: 1.02 mg/dL (ref 0.50–1.10)

## 2012-11-19 LAB — CBC WITH DIFFERENTIAL/PLATELET
HCT: 46.2 % — ABNORMAL HIGH (ref 36.0–46.0)
Hemoglobin: 15 g/dL (ref 12.0–15.0)
Lymphocytes Relative: 22 % (ref 12–46)
Lymphs Abs: 2.4 10*3/uL (ref 0.7–4.0)
MCHC: 32.5 g/dL (ref 30.0–36.0)
Monocytes Absolute: 0.5 10*3/uL (ref 0.1–1.0)
Monocytes Relative: 4 % (ref 3–12)
Neutro Abs: 7.7 10*3/uL (ref 1.7–7.7)
WBC: 10.6 10*3/uL — ABNORMAL HIGH (ref 4.0–10.5)

## 2012-11-19 LAB — ABO/RH: ABO/RH(D): O POS

## 2012-11-19 LAB — OCCULT BLOOD, POC DEVICE: Fecal Occult Bld: POSITIVE — AB

## 2012-11-19 MED ORDER — MOMETASONE FURO-FORMOTEROL FUM 200-5 MCG/ACT IN AERO
2.0000 | INHALATION_SPRAY | Freq: Two times a day (BID) | RESPIRATORY_TRACT | Status: DC
  Administered 2012-11-19 – 2012-11-21 (×4): 2 via RESPIRATORY_TRACT
  Filled 2012-11-19: qty 8.8

## 2012-11-19 MED ORDER — METRONIDAZOLE IN NACL 5-0.79 MG/ML-% IV SOLN: 500.0000 mg | Freq: Three times a day (TID) | INTRAVENOUS | Status: DC

## 2012-11-19 MED ORDER — IOHEXOL 300 MG/ML  SOLN
50.0000 mL | Freq: Once | INTRAMUSCULAR | Status: AC | PRN
  Administered 2012-11-19: 50 mL via ORAL

## 2012-11-19 MED ORDER — CALCIUM CARBONATE 1250 (500 CA) MG PO TABS
1.0000 | ORAL_TABLET | Freq: Every day | ORAL | Status: DC
  Administered 2012-11-20 – 2012-11-21 (×2): 500 mg via ORAL
  Filled 2012-11-19 (×5): qty 1

## 2012-11-19 MED ORDER — IOHEXOL 300 MG/ML  SOLN
100.0000 mL | Freq: Once | INTRAMUSCULAR | Status: AC | PRN
  Administered 2012-11-19: 100 mL via INTRAVENOUS

## 2012-11-19 MED ORDER — OXYCODONE-ACETAMINOPHEN 5-325 MG PO TABS
1.0000 | ORAL_TABLET | ORAL | Status: DC | PRN
  Administered 2012-11-21: 1 via ORAL
  Filled 2012-11-19: qty 1

## 2012-11-19 MED ORDER — MORPHINE SULFATE 4 MG/ML IJ SOLN
4.0000 mg | Freq: Once | INTRAMUSCULAR | Status: AC
  Administered 2012-11-19: 4 mg via INTRAVENOUS
  Filled 2012-11-19: qty 1

## 2012-11-19 MED ORDER — CIPROFLOXACIN IN D5W 400 MG/200ML IV SOLN
400.0000 mg | Freq: Two times a day (BID) | INTRAVENOUS | Status: DC
  Administered 2012-11-20 – 2012-11-21 (×3): 400 mg via INTRAVENOUS
  Filled 2012-11-19 (×5): qty 200

## 2012-11-19 MED ORDER — EFAVIRENZ-EMTRICITAB-TENOFOVIR 600-200-300 MG PO TABS
1.0000 | ORAL_TABLET | Freq: Every day | ORAL | Status: DC
  Administered 2012-11-19 – 2012-11-20 (×2): 1 via ORAL
  Filled 2012-11-19 (×3): qty 1

## 2012-11-19 MED ORDER — SODIUM CHLORIDE 0.9 % IV SOLN
Freq: Once | INTRAVENOUS | Status: AC
  Administered 2012-11-19: 15:00:00 via INTRAVENOUS

## 2012-11-19 MED ORDER — ENOXAPARIN SODIUM 40 MG/0.4ML ~~LOC~~ SOLN: 40.0000 mg | SUBCUTANEOUS | Status: DC

## 2012-11-19 MED ORDER — SODIUM CHLORIDE 0.9 % IV SOLN
1000.0000 mL | Freq: Once | INTRAVENOUS | Status: AC
  Administered 2012-11-19: 1000 mL via INTRAVENOUS

## 2012-11-19 MED ORDER — ALBUTEROL SULFATE (5 MG/ML) 0.5% IN NEBU: 2.5000 mg | INHALATION_SOLUTION | RESPIRATORY_TRACT | Status: DC | PRN

## 2012-11-19 MED ORDER — ONDANSETRON HCL 4 MG/2ML IJ SOLN: 4.0000 mg | Freq: Four times a day (QID) | INTRAMUSCULAR | Status: DC | PRN

## 2012-11-19 MED ORDER — VENLAFAXINE HCL ER 150 MG PO CP24
150.0000 mg | ORAL_CAPSULE | Freq: Every day | ORAL | Status: DC
Start: 2012-11-19 — End: 2012-11-21
  Administered 2012-11-20 – 2012-11-21 (×2): 150 mg via ORAL
  Filled 2012-11-19 (×3): qty 1

## 2012-11-19 MED ORDER — NICOTINE 7 MG/24HR TD PT24
7.0000 mg | MEDICATED_PATCH | Freq: Every day | TRANSDERMAL | Status: DC
  Administered 2012-11-19 – 2012-11-21 (×3): 7 mg via TRANSDERMAL
  Filled 2012-11-19 (×3): qty 1

## 2012-11-19 MED ORDER — LORATADINE 10 MG PO TABS
10.0000 mg | ORAL_TABLET | Freq: Every day | ORAL | Status: DC
Start: 2012-11-19 — End: 2012-11-21
  Administered 2012-11-20 – 2012-11-21 (×2): 10 mg via ORAL
  Filled 2012-11-19 (×3): qty 1

## 2012-11-19 MED ORDER — GABAPENTIN 300 MG PO CAPS
300.0000 mg | ORAL_CAPSULE | Freq: Three times a day (TID) | ORAL | Status: DC
Start: 2012-11-19 — End: 2012-11-21
  Administered 2012-11-19 – 2012-11-21 (×6): 300 mg via ORAL
  Filled 2012-11-19 (×7): qty 1

## 2012-11-19 MED ORDER — MONTELUKAST SODIUM 10 MG PO TABS
10.0000 mg | ORAL_TABLET | Freq: Every day | ORAL | Status: DC
  Administered 2012-11-19 – 2012-11-20 (×2): 10 mg via ORAL
  Filled 2012-11-19 (×3): qty 1

## 2012-11-19 MED ORDER — ONDANSETRON HCL 4 MG PO TABS: 4.0000 mg | ORAL_TABLET | Freq: Four times a day (QID) | ORAL | Status: DC | PRN

## 2012-11-19 MED ORDER — SIMVASTATIN 10 MG PO TABS
10.0000 mg | ORAL_TABLET | Freq: Every day | ORAL | Status: DC
  Administered 2012-11-20: 10 mg via ORAL
  Filled 2012-11-19 (×3): qty 1

## 2012-11-19 MED ORDER — ENOXAPARIN SODIUM 60 MG/0.6ML ~~LOC~~ SOLN
50.0000 mg | SUBCUTANEOUS | Status: DC
  Administered 2012-11-19: 50 mg via SUBCUTANEOUS
  Filled 2012-11-19 (×2): qty 0.6

## 2012-11-19 MED ORDER — CYANOCOBALAMIN 250 MCG PO TABS
250.0000 ug | ORAL_TABLET | Freq: Every day | ORAL | Status: DC
  Administered 2012-11-20 – 2012-11-21 (×2): 250 ug via ORAL
  Filled 2012-11-19 (×3): qty 1

## 2012-11-19 MED ORDER — ONDANSETRON HCL 4 MG/2ML IJ SOLN
4.0000 mg | Freq: Once | INTRAMUSCULAR | Status: AC
  Administered 2012-11-19: 4 mg via INTRAVENOUS
  Filled 2012-11-19: qty 2

## 2012-11-19 MED ORDER — QUETIAPINE FUMARATE ER 50 MG PO TB24
150.0000 mg | ORAL_TABLET | Freq: Every day | ORAL | Status: DC
  Administered 2012-11-19 – 2012-11-20 (×2): 150 mg via ORAL
  Filled 2012-11-19 (×3): qty 3

## 2012-11-19 MED ORDER — SODIUM CHLORIDE 0.9 % IV SOLN
INTRAVENOUS | Status: DC
  Administered 2012-11-19 – 2012-11-20 (×2): via INTRAVENOUS

## 2012-11-19 MED ORDER — ASPIRIN EC 81 MG PO TBEC
81.0000 mg | DELAYED_RELEASE_TABLET | Freq: Every day | ORAL | Status: DC
Start: 2012-11-19 — End: 2012-11-21
  Administered 2012-11-20 – 2012-11-21 (×2): 81 mg via ORAL
  Filled 2012-11-19 (×3): qty 1

## 2012-11-19 MED ORDER — PHENAZOPYRIDINE HCL 200 MG PO TABS
200.0000 mg | ORAL_TABLET | Freq: Three times a day (TID) | ORAL | Status: DC
  Administered 2012-11-19 – 2012-11-21 (×6): 200 mg via ORAL
  Filled 2012-11-19 (×7): qty 1

## 2012-11-19 MED ORDER — HYDROMORPHONE HCL PF 1 MG/ML IJ SOLN
1.0000 mg | INTRAMUSCULAR | Status: DC | PRN
  Administered 2012-11-20: 1 mg via INTRAVENOUS
  Filled 2012-11-19 (×2): qty 1

## 2012-11-19 MED ORDER — METRONIDAZOLE 500 MG PO TABS
500.0000 mg | ORAL_TABLET | Freq: Three times a day (TID) | ORAL | Status: DC
  Administered 2012-11-19 – 2012-11-21 (×6): 500 mg via ORAL
  Filled 2012-11-19 (×8): qty 1

## 2012-11-19 NOTE — ED Notes (Signed)
Patient has been up to the BR continuing to pass blood from her rectum. Patient states that the bleeding seems to have increased. Awaiting a CT of her pelvis and admission.

## 2012-11-19 NOTE — ED Notes (Signed)
Complained of abdominal pain about 11 am. Pain is 9/10. Got up to go to [facility] and had blood clots from her rectum.

## 2012-11-19 NOTE — H&P (Signed)
PCP:   Quitman Livings, MD   Chief Complaint:  Rectal bleeding  HPI: 67 year old female with a history of HIV, hypertension, asthma who came to the hospital with one day history of rectal bleeding accompanied by abdominal pain. Patient reports frank blood in the stool she denies diarrhea. It is associated with lower right and left quadrant cramping. She denies any history of fever. She complains of episode of sweating though she denies fever. She denies chest pain or shortness of breath. She denies nausea and vomiting. Her hemoglobin has remained stable.  Allergies:  No Known Allergies    Past Medical History  Diagnosis Date  . COPD (chronic obstructive pulmonary disease)   . Hyperlipidemia   . HIV (human immunodeficiency virus infection)     Past Surgical History  Procedure Laterality Date  . Abcess drainage      Prior to Admission medications   Medication Sig Start Date End Date Taking? Authorizing Provider  albuterol (PROVENTIL HFA;VENTOLIN HFA) 108 (90 BASE) MCG/ACT inhaler Inhale 1 puff into the lungs 2 (two) times daily. 08/10/12  Yes Cliffton Asters, MD  albuterol (PROVENTIL) (2.5 MG/3ML) 0.083% nebulizer solution Take 3 mLs (2.5 mg total) by nebulization every 4 (four) hours as needed for wheezing or shortness of breath. 10/04/12  Yes Cliffton Asters, MD  aspirin EC 81 MG tablet Take 81 mg by mouth daily.   Yes Historical Provider, MD  calcium carbonate (OS-CAL - DOSED IN MG OF ELEMENTAL CALCIUM) 1250 MG tablet Take 1 tablet by mouth daily.   Yes Historical Provider, MD  efavirenz-emtricitabine-tenofovir (ATRIPLA) 600-200-300 MG per tablet Take 1 tablet by mouth at bedtime.   Yes Historical Provider, MD  Fluticasone-Salmeterol (ADVAIR) 500-50 MCG/DOSE AEPB Inhale 1 puff into the lungs every 12 (twelve) hours.   Yes Historical Provider, MD  gabapentin (NEURONTIN) 300 MG capsule Take 300 mg by mouth 3 (three) times daily.   Yes Historical Provider, MD  loratadine (CLARITIN) 10 MG tablet  Take 10 mg by mouth daily.   Yes Historical Provider, MD  montelukast (SINGULAIR) 10 MG tablet Take 10 mg by mouth at bedtime.   Yes Historical Provider, MD  oxyCODONE-acetaminophen (PERCOCET/ROXICET) 5-325 MG per tablet Take 1 tablet by mouth every 4 (four) hours as needed for pain.   Yes Historical Provider, MD  phenazopyridine (PYRIDIUM) 200 MG tablet Take 1 tablet (200 mg total) by mouth 3 (three) times daily. 10/28/12  Yes Doug Sou, MD  pravastatin (PRAVACHOL) 20 MG tablet Take 20 mg by mouth daily.   Yes Historical Provider, MD  QUEtiapine Fumarate (SEROQUEL XR) 150 MG 24 hr tablet Take 150 mg by mouth at bedtime.   Yes Historical Provider, MD  traMADol (ULTRAM) 50 MG tablet Take 50 mg by mouth every 6 (six) hours as needed for pain.   Yes Historical Provider, MD  venlafaxine XR (EFFEXOR-XR) 150 MG 24 hr capsule Take 150 mg by mouth daily.   Yes Historical Provider, MD  vitamin B-12 (CYANOCOBALAMIN) 250 MCG tablet Take 250 mcg by mouth daily.   Yes Historical Provider, MD  sulfamethoxazole-trimethoprim (SEPTRA DS) 800-160 MG per tablet Take 1 tablet by mouth 2 (two) times daily. 10/28/12   Doug Sou, MD    Social History:  reports that she has been smoking Cigarettes.  She has been smoking about 1.00 pack per day. She has never used smokeless tobacco. She reports that she does not drink alcohol or use illicit drugs.  Family History  Problem Relation Age of Onset  . Pneumonia Mother   .  Cancer Sister   . Hypertension Sister   . Diabetes Sister     Review of Systems:  HEENT: Denies headache, blurred vision, runny nose, sore throat,  Neck: Denies thyroid problems,lymphadenopathy Chest : Positive history of asthma Heart : Denies Chest pain,  coronary arterey disease GI: See history of present illness GU: Denies dysuria, urgency, frequency of urination, hematuria Neuro: Denies stroke, seizures, syncope Psych: Denies depression, anxiety, hallucinations   Physical Exam: Blood  pressure 140/77, pulse 83, temperature 97.9 F (36.6 C), temperature source Oral, resp. rate 16, height 5\' 10"  (1.778 m), weight 100.699 kg (222 lb), SpO2 95.00%. Constitutional:   Patient is a well-developed and well-nourished female in no acute distress and cooperative with exam. Head: Normocephalic and atraumatic Mouth: Mucus membranes moist Eyes: PERRL, EOMI, conjunctivae normal Neck: Supple, No Thyromegaly Cardiovascular: RRR, S1 normal, S2 normal Pulmonary/Chest: CTAB, no wheezes, rales, or rhonchi Abdominal: Soft. Mild distention, positive tenderness noted in the right and left lower quadrants no rigidity no guarding elicited at this time.  Neurological: A&O x3, Strenght is normal and symmetric bilaterally, cranial nerve II-XII are grossly intact, no focal motor deficit, sensory intact to light touch bilaterally.  Extremities : No Cyanosis, Clubbing or Edema   Labs on Admission:  Results for orders placed during the hospital encounter of 11/19/12 (from the past 48 hour(s))  CBC WITH DIFFERENTIAL     Status: Abnormal   Collection Time    11/19/12  1:29 PM      Result Value Range   WBC 10.6 (*) 4.0 - 10.5 K/uL   RBC 5.22 (*) 3.87 - 5.11 MIL/uL   Hemoglobin 15.0  12.0 - 15.0 g/dL   HCT 45.4 (*) 09.8 - 11.9 %   MCV 88.5  78.0 - 100.0 fL   MCH 28.7  26.0 - 34.0 pg   MCHC 32.5  30.0 - 36.0 g/dL   RDW 14.7  82.9 - 56.2 %   Platelets 237  150 - 400 K/uL   Neutrophils Relative % 72  43 - 77 %   Neutro Abs 7.7  1.7 - 7.7 K/uL   Lymphocytes Relative 22  12 - 46 %   Lymphs Abs 2.4  0.7 - 4.0 K/uL   Monocytes Relative 4  3 - 12 %   Monocytes Absolute 0.5  0.1 - 1.0 K/uL   Eosinophils Relative 1  0 - 5 %   Eosinophils Absolute 0.1  0.0 - 0.7 K/uL   Basophils Relative 0  0 - 1 %   Basophils Absolute 0.0  0.0 - 0.1 K/uL  BASIC METABOLIC PANEL     Status: Abnormal   Collection Time    11/19/12  1:29 PM      Result Value Range   Sodium 139  135 - 145 mEq/L   Potassium 3.6  3.5 - 5.1  mEq/L   Chloride 103  96 - 112 mEq/L   CO2 27  19 - 32 mEq/L   Glucose, Bld 113 (*) 70 - 99 mg/dL   BUN 12  6 - 23 mg/dL   Creatinine, Ser 1.30  0.50 - 1.10 mg/dL   Calcium 9.2  8.4 - 86.5 mg/dL   GFR calc non Af Amer 56 (*) >90 mL/min   GFR calc Af Amer 65 (*) >90 mL/min   Comment:            The eGFR has been calculated     using the CKD EPI equation.     This  calculation has not been     validated in all clinical     situations.     eGFR's persistently     <90 mL/min signify     possible Chronic Kidney Disease.  OCCULT BLOOD, POC DEVICE     Status: Abnormal   Collection Time    11/19/12  2:35 PM      Result Value Range   Fecal Occult Bld POSITIVE (*) NEGATIVE  TYPE AND SCREEN     Status: None   Collection Time    11/19/12  3:15 PM      Result Value Range   ABO/RH(D) O POS     Antibody Screen PENDING     Sample Expiration 11/22/2012      Radiological Exams on Admission: No results found.  Assessment/Plan Rectal Bleeding Abdominal pain HIV Asthma  Rectal bleeding  Patient may have underlying infectious colitis versus diverticulosis though due to the cramping the diverticulosis is less likely. Will obtain CT scan abdomen pelvis, monitor H&H Q8 hours.  Abdominal pain As above patient may have infectious versus ischemic colitis versus diverticulosis. We'll obtain CT scan abdomen/pelvis  HIV Patient will be continued on Atripla  Asthma Continue albuterol when necessary  DVT prophylaxis Lovenox    Time Spent on Admission: 75 min  LAMA,GAGAN S [facility] Pager: [phone] 11/19/2012, 4:44 PM

## 2012-11-19 NOTE — ED Provider Notes (Signed)
History     CSN: [id number]  Arrival date & time 11/19/12  1305   First MD Initiated Contact with Patient 11/19/12 1405      Chief Complaint  Patient presents with  . Abdominal Pain  . Rectal Bleeding    (Consider location/radiation/quality/duration/timing/severity/associated sxs/prior treatment) Patient is a 67 y.o. female presenting with abdominal pain and hematochezia. The history is provided by the patient.  Abdominal Pain Associated symptoms include abdominal pain.  Rectal Bleeding Associated symptoms: abdominal pain   She had onset at about 11 AM of severe, generalized abdominal cramping and sense of rectal urgency. When she did move her bowels, there was temporary relief of pain and she noticed that there was blood in the bowl as well as clots. She has had several additional episodes of abdominal cramping with passing blood. Abdominal pain has been fairly constant but she is up just before a bowel movement and then subsides slightly. She rates current pain at 9/10 but worse pain was at 10/10. She has nausea when her pain gets severe and she also has diaphoresis the same time. She has not vomited. She's never had rectal bleeding before. Nothing makes symptoms better nothing makes them worse. She is not on any anticoagulants or antiplatelet agents.  Past Medical History  Diagnosis Date  . COPD (chronic obstructive pulmonary disease)   . Hyperlipidemia   . HIV (human immunodeficiency virus infection)     Past Surgical History  Procedure Laterality Date  . Abcess drainage      Family History  Problem Relation Age of Onset  . Pneumonia Mother   . Cancer Sister   . Hypertension Sister   . Diabetes Sister     History  Substance Use Topics  . Smoking status: Current Every Day Smoker -- 1.00 packs/day    Types: Cigarettes  . Smokeless tobacco: Never Used  . Alcohol Use: No    OB History   Grav Para Term Preterm Abortions TAB SAB Ect Mult Living                   Review of Systems  Gastrointestinal: Positive for abdominal pain and hematochezia.  All other systems reviewed and are negative.    Allergies  Review of patient's allergies indicates no known allergies.  Home Medications   Current Outpatient Rx  Name  Route  Sig  Dispense  Refill  . albuterol (PROVENTIL HFA;VENTOLIN HFA) 108 (90 BASE) MCG/ACT inhaler   Inhalation   Inhale 1 puff into the lungs 2 (two) times daily.   1 Inhaler   3   . albuterol (PROVENTIL) (2.5 MG/3ML) 0.083% nebulizer solution   Nebulization   Take 3 mLs (2.5 mg total) by nebulization every 4 (four) hours as needed for wheezing or shortness of breath.   75 mL   5   . aspirin EC 81 MG tablet   Oral   Take 81 mg by mouth daily.         . calcium carbonate (OS-CAL - DOSED IN MG OF ELEMENTAL CALCIUM) 1250 MG tablet   Oral   Take 1 tablet by mouth daily.         Marland Kitchen efavirenz-emtricitabine-tenofovir (ATRIPLA) 600-200-300 MG per tablet   Oral   Take 1 tablet by mouth at bedtime.         . Fluticasone-Salmeterol (ADVAIR) 500-50 MCG/DOSE AEPB   Inhalation   Inhale 1 puff into the lungs every 12 (twelve) hours.         Marland Kitchen  gabapentin (NEURONTIN) 300 MG capsule   Oral   Take 300 mg by mouth 3 (three) times daily.         Marland Kitchen loratadine (CLARITIN) 10 MG tablet   Oral   Take 10 mg by mouth daily.         . montelukast (SINGULAIR) 10 MG tablet   Oral   Take 10 mg by mouth at bedtime.         Marland Kitchen oxyCODONE-acetaminophen (PERCOCET/ROXICET) 5-325 MG per tablet   Oral   Take 1 tablet by mouth every 4 (four) hours as needed for pain.         . phenazopyridine (PYRIDIUM) 200 MG tablet   Oral   Take 1 tablet (200 mg total) by mouth 3 (three) times daily.   6 tablet   0   . pravastatin (PRAVACHOL) 20 MG tablet   Oral   Take 20 mg by mouth daily.         . QUEtiapine Fumarate (SEROQUEL XR) 150 MG 24 hr tablet   Oral   Take 150 mg by mouth at bedtime.         . traMADol (ULTRAM) 50 MG  tablet   Oral   Take 50 mg by mouth every 6 (six) hours as needed for pain.         Marland Kitchen venlafaxine XR (EFFEXOR-XR) 150 MG 24 hr capsule   Oral   Take 150 mg by mouth daily.         . vitamin B-12 (CYANOCOBALAMIN) 250 MCG tablet   Oral   Take 250 mcg by mouth daily.         Marland Kitchen sulfamethoxazole-trimethoprim (SEPTRA DS) 800-160 MG per tablet   Oral   Take 1 tablet by mouth 2 (two) times daily.   14 tablet   0     BP 169/82  Pulse 91  Temp(Src) 97.9 F (36.6 C) (Oral)  Resp 18  Ht 5\' 10"  (1.778 m)  Wt 222 lb (100.699 kg)  BMI 31.85 kg/m2  SpO2 90%  Physical Exam  Nursing note and vitals reviewed.  67 year old female, resting comfortably and in no acute distress. Vital signs are significant for hypertension with blood pressure 169/82. Oxygen saturation is 90%, which is hypoxic, but at the time I am evaluating her in the room, oxygen saturation is 94% which is normal. Head is normocephalic and atraumatic. PERRLA, EOMI. Oropharynx is clear. Neck is nontender and supple without adenopathy or JVD. Back is nontender and there is no CVA tenderness. Lungs are clear without rales, wheezes, or rhonchi. Chest is nontender. Heart has regular rate and rhythm without murmur. Abdomen is soft, flat, nontender without masses or hepatosplenomegaly and peristalsis is normoactive. Rectal: Normal sphincter 10. Moderate amount of bright red blood present. No masses are felt. Extremities have no cyanosis or edema, full range of motion is present. Skin is warm and dry without rash. Neurologic: Mental status is normal, cranial nerves are intact, there are no motor or sensory deficits.  ED Course  Procedures (including critical care time)  Results for orders placed during the hospital encounter of 11/19/12  CBC WITH DIFFERENTIAL      Result Value Range   WBC 10.6 (*) 4.0 - 10.5 K/uL   RBC 5.22 (*) 3.87 - 5.11 MIL/uL   Hemoglobin 15.0  12.0 - 15.0 g/dL   HCT 16.1 (*) 09.6 - 04.5 %   MCV  88.5  78.0 - 100.0 fL   MCH 28.7  26.0 - 34.0 pg   MCHC 32.5  30.0 - 36.0 g/dL   RDW 32.4  40.1 - 02.7 %   Platelets 237  150 - 400 K/uL   Neutrophils Relative % 72  43 - 77 %   Neutro Abs 7.7  1.7 - 7.7 K/uL   Lymphocytes Relative 22  12 - 46 %   Lymphs Abs 2.4  0.7 - 4.0 K/uL   Monocytes Relative 4  3 - 12 %   Monocytes Absolute 0.5  0.1 - 1.0 K/uL   Eosinophils Relative 1  0 - 5 %   Eosinophils Absolute 0.1  0.0 - 0.7 K/uL   Basophils Relative 0  0 - 1 %   Basophils Absolute 0.0  0.0 - 0.1 K/uL  BASIC METABOLIC PANEL      Result Value Range   Sodium 139  135 - 145 mEq/L   Potassium 3.6  3.5 - 5.1 mEq/L   Chloride 103  96 - 112 mEq/L   CO2 27  19 - 32 mEq/L   Glucose, Bld 113 (*) 70 - 99 mg/dL   BUN 12  6 - 23 mg/dL   Creatinine, Ser 2.53  0.50 - 1.10 mg/dL   Calcium 9.2  8.4 - 66.4 mg/dL   GFR calc non Af Amer 56 (*) >90 mL/min   GFR calc Af Amer 65 (*) >90 mL/min  OCCULT BLOOD, POC DEVICE      Result Value Range   Fecal Occult Bld POSITIVE (*) NEGATIVE  TYPE AND SCREEN      Result Value Range   ABO/RH(D) O POS     Antibody Screen PENDING     Sample Expiration 11/22/2012       1. Hematochezia   2. Abdominal cramping       MDM  Rectal bleeding of uncertain cause. Old records are reviewed and she has no ED visits or hospitalizations for similar events. CT of abdomen and pelvis had been done in 2008 and was unremarkable. Also, note that she has a history of COPD and states that she is on oxygen to use at night because of low oxygen saturations.  Workup is fairly unrevealing. She has normal BUN and creatinine and hemoglobin has not dropped from baseline. Orthostatic vital signs did not show any significant change. However, she has had several more bloody bowel movements while in the ED. Case is discussed with Dr. Sharl Ma  [facility] who agrees to admit the patient for observation, but requests CT of abdomen and pelvis be done. That has been  ordered.      Dione Booze, MD 11/19/12 [phone]

## 2012-11-20 DIAGNOSIS — R109 Unspecified abdominal pain: Secondary | ICD-10-CM

## 2012-11-20 DIAGNOSIS — K5289 Other specified noninfective gastroenteritis and colitis: Secondary | ICD-10-CM

## 2012-11-20 DIAGNOSIS — B20 Human immunodeficiency virus [HIV] disease: Secondary | ICD-10-CM

## 2012-11-20 LAB — CBC
HCT: 41 % (ref 36.0–46.0)
Hemoglobin: 12.8 g/dL (ref 12.0–15.0)
MCHC: 31.2 g/dL (ref 30.0–36.0)
RBC: 4.61 MIL/uL (ref 3.87–5.11)

## 2012-11-20 LAB — COMPREHENSIVE METABOLIC PANEL WITH GFR
ALT: 11 U/L (ref 0–35)
AST: 14 U/L (ref 0–37)
Albumin: 3.2 g/dL — ABNORMAL LOW (ref 3.5–5.2)
Alkaline Phosphatase: 75 U/L (ref 39–117)
BUN: 6 mg/dL (ref 6–23)
CO2: 26 meq/L (ref 19–32)
Calcium: 8.4 mg/dL (ref 8.4–10.5)
Chloride: 104 meq/L (ref 96–112)
Creatinine, Ser: 0.88 mg/dL (ref 0.50–1.10)
GFR calc Af Amer: 78 mL/min — ABNORMAL LOW
GFR calc non Af Amer: 67 mL/min — ABNORMAL LOW
Glucose, Bld: 131 mg/dL — ABNORMAL HIGH (ref 70–99)
Potassium: 3 meq/L — ABNORMAL LOW (ref 3.5–5.1)
Sodium: 139 meq/L (ref 135–145)
Total Bilirubin: 0.2 mg/dL — ABNORMAL LOW (ref 0.3–1.2)
Total Protein: 7.2 g/dL (ref 6.0–8.3)

## 2012-11-20 LAB — HEMOGLOBIN AND HEMATOCRIT, BLOOD
HCT: 40.9 % (ref 36.0–46.0)
HCT: 39.4 % (ref 36.0–46.0)
Hemoglobin: 12.9 g/dL (ref 12.0–15.0)
Hemoglobin: 12.7 g/dL (ref 12.0–15.0)

## 2012-11-20 LAB — CLOSTRIDIUM DIFFICILE BY PCR: Toxigenic C. Difficile by PCR: NEGATIVE

## 2012-11-20 MED ORDER — POTASSIUM CHLORIDE CRYS ER 20 MEQ PO TBCR
40.0000 meq | EXTENDED_RELEASE_TABLET | Freq: Two times a day (BID) | ORAL | Status: AC
  Administered 2012-11-20 (×2): 40 meq via ORAL
  Filled 2012-11-20 (×2): qty 2

## 2012-11-20 MED ORDER — SODIUM CHLORIDE 0.9 % IV SOLN
INTRAVENOUS | Status: DC
  Administered 2012-11-20 – 2012-11-21 (×2): via INTRAVENOUS
  Filled 2012-11-20 (×3): qty 1000

## 2012-11-20 NOTE — Progress Notes (Signed)
[facility] PROGRESS NOTE  [name] ZOX:[id number] DOB: 06-24-1946 DOA: 11/19/2012 PCP: Quitman Livings, MD  Assessment/Plan:  Descending and sigmoid colitis Started on cipro and Flagyl Clear liquid diet  Rectal bleeding H&H dropped from 15-- 12.8 Will follow cbc in am   HIV  Patient will be continued on Atripla   Asthma  Continue albuterol when necessary   Hypokalemia Will replace the potassium    DVT prophylaxis  SCD   Code Status: full code Family Communication: Discussed with patient Disposition Plan: Home when stable   Consultants:  None  Procedures:  None  Antibiotics:  Ciprofloxacin 5/24>>  Flagyl 5/24 >>  HPI/Subjective: Patient seen and examined, pain is better, still having bloody bowel movements. Hb is 12.8, CT shows descending and sigmoid colitis  Objective: Filed Vitals:   11/19/12 1800 11/19/12 2324 11/20/12 0517 11/20/12 0913  BP: 159/85 145/75 105/49   Pulse: 86 77 76   Temp: 100.1 F (37.8 C) 99 F (37.2 C) 99.3 F (37.4 C)   TempSrc: Oral Oral Oral   Resp: 18 18 16    Height:      Weight:      SpO2: 90% 92% 90% 91%    Intake/Output Summary (Last 24 hours) at 11/20/12 1418 Last data filed at 11/20/12 0900  Gross per 24 hour  Intake   1751 ml  Output      0 ml  Net   1751 ml   Filed Weights   11/19/12 1311  Weight: 100.699 kg (222 lb)    Exam:   General:  Appear in no acute distress  Cardiovascular: S1s2 RRR  Respiratory: Clear bilaterally  Abdomen: Non tender to palpation  Musculoskeletal: No edema  Data Reviewed: Basic Metabolic Panel:  Recent Labs Lab 11/19/12 1329 11/20/12 0420  NA 139 139  K 3.6 3.0*  CL 103 104  CO2 27 26  GLUCOSE 113* 131*  BUN 12 6  CREATININE 1.02 0.88  CALCIUM 9.2 8.4   Liver Function Tests:  Recent Labs Lab 11/20/12 0420  AST 14  ALT 11  ALKPHOS 75  BILITOT 0.2*  PROT 7.2  ALBUMIN 3.2*   No results found for this basename: LIPASE, AMYLASE,  in the  last 168 hours No results found for this basename: AMMONIA,  in the last 168 hours CBC:  Recent Labs Lab 11/19/12 1329 11/20/12 0420 11/20/12 0948  WBC 10.6* 8.8  --   NEUTROABS 7.7  --   --   HGB 15.0 12.8 12.9  HCT 46.2* 41.0 40.9  MCV 88.5 88.9  --   PLT 237 185  --    Cardiac Enzymes: No results found for this basename: CKTOTAL, CKMB, CKMBINDEX, TROPONINI,  in the last 168 hours BNP (last 3 results) No results found for this basename: PROBNP,  in the last 8760 hours CBG: No results found for this basename: GLUCAP,  in the last 168 hours  Recent Results (from the past 240 hour(s))  CLOSTRIDIUM DIFFICILE BY PCR     Status: None   Collection Time    11/19/12  8:10 PM      Result Value Range Status   C difficile by pcr NEGATIVE  NEGATIVE Final     Studies: Ct Abdomen Pelvis W Contrast  11/19/2012   *RADIOLOGY REPORT*  Clinical Data: Abdominal pain.  Weakness.  Rectal bleeding.  HIV positive.  Smoker.  CT ABDOMEN AND PELVIS WITH CONTRAST  Technique:  Multidetector CT imaging of the abdomen and pelvis was performed following  the standard protocol during bolus administration of intravenous contrast.  Contrast: 50mL OMNIPAQUE IOHEXOL 300 MG/ML  SOLN, OMNIPAQUE IOHEXOL 300 MG/ML  SOLN  Comparison: 10/01/2006.  Findings: Diffuse low density wall thickening involving the descending colon and sigmoid colon with pericolonic soft tissue stranding.  No diverticula are seen.  Normal appearing appendix.  No small bowel abnormalities or enlarged lymph nodes.  Better visualized 1.2 cm right ovarian cyst without significant change in size.  Normal appearing uterus and left ovary.  Minimal diffuse bladder wall thickening.  Unremarkable kidneys, adrenal glands, spleen, pancreas and gallbladder.  Stable prominent lateral segment left lobe and caudate lobe of the liver with a relatively small right lobe and minimally lobulated liver contours.  No significant change in mild diffuse peribronchial  thickening and accentuation of the interstitial markings.  Minimal dependent atelectasis at the right lung base.  Mild atheromatous arterial calcifications.  Lumbar spine degenerative changes.  IMPRESSION:  1.  Descending and sigmoid colitis. 2.  Suggestion of early changes of cirrhosis of the liver, unchanged. 3.  Chronic bronchitic changes, compatible with the history of smoking.   Original Report Authenticated By: Beckie Salts, M.D.    Scheduled Meds: . aspirin EC  81 mg Oral Daily  . calcium carbonate  1 tablet Oral Q breakfast  . ciprofloxacin  400 mg Intravenous Q12H  . efavirenz-emtricitabine-tenofovir  1 tablet Oral QHS  . enoxaparin (LOVENOX) injection  50 mg Subcutaneous Q24H  . gabapentin  300 mg Oral TID  . loratadine  10 mg Oral Daily  . metroNIDAZOLE  500 mg Oral Q8H  . mometasone-formoterol  2 puff Inhalation BID  . montelukast  10 mg Oral QHS  . nicotine  7 mg Transdermal Daily  . phenazopyridine  200 mg Oral TID  . potassium chloride  40 mEq Oral BID  . QUEtiapine Fumarate  150 mg Oral QHS  . simvastatin  10 mg Oral q1800  . venlafaxine XR  150 mg Oral Daily  . vitamin B-12  250 mcg Oral Daily   Continuous Infusions: . sodium chloride 0.9 % 1,000 mL with potassium chloride 40 mEq infusion 75 mL/hr at 11/20/12 1019    Active Problems:   HIV DISEASE   Hypertension   Asthma   Rectal bleed    Time spent: 25 min    [facility]  [facility] Pager [phone]*. If 7PM-7AM, please contact night-coverage at [email or web address], password [facility] 11/20/2012, 2:18 PM  LOS: 1 day

## 2012-11-21 LAB — COMPREHENSIVE METABOLIC PANEL
Albumin: 2.8 g/dL — ABNORMAL LOW (ref 3.5–5.2)
BUN: 6 mg/dL (ref 6–23)
Calcium: 8.8 mg/dL (ref 8.4–10.5)
GFR calc Af Amer: 76 mL/min — ABNORMAL LOW (ref >90)
Glucose, Bld: 99 mg/dL (ref 70–99)
Sodium: 140 mEq/L (ref 135–145)
Total Protein: 6.8 g/dL (ref 6.0–8.3)

## 2012-11-21 LAB — CBC
HCT: 39.4 % (ref 36.0–46.0)
Hemoglobin: 12.3 g/dL (ref 12.0–15.0)
MCH: 27.8 pg (ref 26.0–34.0)
MCHC: 31.2 g/dL (ref 30.0–36.0)
RDW: 14.9 % (ref 11.5–15.5)

## 2012-11-21 LAB — HEMOGLOBIN AND HEMATOCRIT, BLOOD: Hemoglobin: 12.7 g/dL (ref 12.0–15.0)

## 2012-11-21 MED ORDER — METRONIDAZOLE 500 MG PO TABS: 500.0000 mg | ORAL_TABLET | Freq: Three times a day (TID) | ORAL | Status: DC

## 2012-11-21 MED ORDER — CIPROFLOXACIN HCL 500 MG PO TABS: 500.0000 mg | ORAL_TABLET | Freq: Two times a day (BID) | ORAL | Status: DC

## 2012-11-21 NOTE — Progress Notes (Signed)
Nutrition Brief Note  Patient identified on the Malnutrition Screening Tool (MST) Report  Body mass index is 31.85 kg/(m^2). Patient meets criteria for class I obesity based on current BMI.   Current diet order is full liquid, patient is consuming approximately 100% of meals at this time. Labs and medications reviewed. Met with pt who reports good appetite PTA with stable weight. Pt admitted with abdominal pain and rectal bleed. Pt reports abdominal pain has eased off and c/o that the Dilaudid shot she was given last night made her eyes burn.    No nutrition interventions warranted at this time. If nutrition issues arise, please consult RD.   Levon Hedger MS, RD, LDN [phone] Pager [phone] After Hours Pager

## 2012-11-21 NOTE — Progress Notes (Signed)
Physician Discharge Summary  [name] AOZ:[id number] DOB: 09/26/1945 DOA: 11/19/2012  PCP: Quitman Livings, MD  Admit date: 11/19/2012 Discharge date: 11/21/2012  Time spent: 50* minutes  Recommendations for Outpatient Follow-up:  1. Follow up PCP in 2 weks  Discharge Diagnoses:  Active Problems:   HIV DISEASE   Hypertension   Asthma   Rectal bleed   Other and unspecified noninfectious gastroenteritis and colitis   Discharge Condition: Stable  Diet recommendation: Low salt diet  Filed Weights   11/19/12 1311  Weight: 100.699 kg (222 lb)    History of present illness:  67 year old female with a history of HIV, hypertension, asthma who came to the hospital with one day history of rectal bleeding accompanied by abdominal pain. Patient reports frank blood in the stool she denies diarrhea. It is associated with lower right and left quadrant cramping. She denies any history of fever. She complains of episode of sweating though she denies fever. She denies chest pain or shortness of breath. She denies nausea and vomiting. Her hemoglobin has remained stable.   Hospital Course:   Descending and sigmoid colitis  Improved with antibiotics, tolerating po diet Started on cipro and Flagyl, will d/c on these for 10 more days  Rectal bleeding  Resolved. H&h has been stable over past 24 hrs   HIV  Patient will be continued on Atripla   Asthma  Continue albuterol when necessary   Hypokalemia  Resolved.     Procedures:  None  Consultations:  None  Discharge Exam: Filed Vitals:   11/20/12 1435 11/20/12 2047 11/21/12 0532 11/21/12 0809  BP: 141/69 126/74 128/65   Pulse: 85 73 75   Temp: 98.5 F (36.9 C) 98.5 F (36.9 C) 99 F (37.2 C)   TempSrc: Oral Oral Oral   Resp: 18 20 20    Height:      Weight:      SpO2: 94% 97% 97% 92%    General: appear in no acute distress Cardiovascular: s1s2 RRR Respiratory: Clear bilaterally Ext : No edema  Discharge  Instructions     Medication List    STOP taking these medications       sulfamethoxazole-trimethoprim 800-160 MG per tablet  Commonly known as:  SEPTRA DS      TAKE these medications       albuterol 108 (90 BASE) MCG/ACT inhaler  Commonly known as:  PROVENTIL HFA;VENTOLIN HFA  Inhale 1 puff into the lungs 2 (two) times daily.     albuterol (2.5 MG/3ML) 0.083% nebulizer solution  Commonly known as:  PROVENTIL  Take 3 mLs (2.5 mg total) by nebulization every 4 (four) hours as needed for wheezing or shortness of breath.     aspirin EC 81 MG tablet  Take 81 mg by mouth daily.     calcium carbonate 1250 MG tablet  Commonly known as:  OS-CAL - dosed in mg of elemental calcium  Take 1 tablet by mouth daily.     ciprofloxacin 500 MG tablet  Commonly known as:  CIPRO  Take 1 tablet (500 mg total) by mouth 2 (two) times daily.     efavirenz-emtricitabine-tenofovir 600-200-300 MG per tablet  Commonly known as:  ATRIPLA  Take 1 tablet by mouth at bedtime.     Fluticasone-Salmeterol 500-50 MCG/DOSE Aepb  Commonly known as:  ADVAIR  Inhale 1 puff into the lungs every 12 (twelve) hours.     gabapentin 300 MG capsule  Commonly known as:  NEURONTIN  Take 300 mg by mouth  3 (three) times daily.     loratadine 10 MG tablet  Commonly known as:  CLARITIN  Take 10 mg by mouth daily.     metroNIDAZOLE 500 MG tablet  Commonly known as:  FLAGYL  Take 1 tablet (500 mg total) by mouth 3 (three) times daily.     montelukast 10 MG tablet  Commonly known as:  SINGULAIR  Take 10 mg by mouth at bedtime.     oxyCODONE-acetaminophen 5-325 MG per tablet  Commonly known as:  PERCOCET/ROXICET  Take 1 tablet by mouth every 4 (four) hours as needed for pain.     phenazopyridine 200 MG tablet  Commonly known as:  PYRIDIUM  Take 1 tablet (200 mg total) by mouth 3 (three) times daily.     pravastatin 20 MG tablet  Commonly known as:  PRAVACHOL  Take 20 mg by mouth daily.     QUEtiapine  Fumarate 150 MG 24 hr tablet  Commonly known as:  SEROQUEL XR  Take 150 mg by mouth at bedtime.     traMADol 50 MG tablet  Commonly known as:  ULTRAM  Take 50 mg by mouth every 6 (six) hours as needed for pain.     venlafaxine XR 150 MG 24 hr capsule  Commonly known as:  EFFEXOR-XR  Take 150 mg by mouth daily.     vitamin B-12 250 MCG tablet  Commonly known as:  CYANOCOBALAMIN  Take 250 mcg by mouth daily.       No Known Allergies    The results of significant diagnostics from this hospitalization (including imaging, microbiology, ancillary and laboratory) are listed below for reference.    Significant Diagnostic Studies: Ct Abdomen Pelvis W Contrast  11/19/2012   *RADIOLOGY REPORT*  Clinical Data: Abdominal pain.  Weakness.  Rectal bleeding.  HIV positive.  Smoker.  CT ABDOMEN AND PELVIS WITH CONTRAST  Technique:  Multidetector CT imaging of the abdomen and pelvis was performed following the standard protocol during bolus administration of intravenous contrast.  Contrast: 50mL OMNIPAQUE IOHEXOL 300 MG/ML  SOLN, OMNIPAQUE IOHEXOL 300 MG/ML  SOLN  Comparison: 10/01/2006.  Findings: Diffuse low density wall thickening involving the descending colon and sigmoid colon with pericolonic soft tissue stranding.  No diverticula are seen.  Normal appearing appendix.  No small bowel abnormalities or enlarged lymph nodes.  Better visualized 1.2 cm right ovarian cyst without significant change in size.  Normal appearing uterus and left ovary.  Minimal diffuse bladder wall thickening.  Unremarkable kidneys, adrenal glands, spleen, pancreas and gallbladder.  Stable prominent lateral segment left lobe and caudate lobe of the liver with a relatively small right lobe and minimally lobulated liver contours.  No significant change in mild diffuse peribronchial thickening and accentuation of the interstitial markings.  Minimal dependent atelectasis at the right lung base.  Mild atheromatous arterial  calcifications.  Lumbar spine degenerative changes.  IMPRESSION:  1.  Descending and sigmoid colitis. 2.  Suggestion of early changes of cirrhosis of the liver, unchanged. 3.  Chronic bronchitic changes, compatible with the history of smoking.   Original Report Authenticated By: Beckie Salts, M.D.    Microbiology: Recent Results (from the past 240 hour(s))  CLOSTRIDIUM DIFFICILE BY PCR     Status: None   Collection Time    11/19/12  8:10 PM      Result Value Range Status   C difficile by pcr NEGATIVE  NEGATIVE Final     Labs: Basic Metabolic Panel:  Recent Labs Lab  11/19/12 1329 11/20/12 0420 11/21/12 0232  NA 139 139 140  K 3.6 3.0* 4.3  CL 103 104 106  CO2 27 26 26   GLUCOSE 113* 131* 99  BUN 12 6 6   CREATININE 1.02 0.88 0.90  CALCIUM 9.2 8.4 8.8   Liver Function Tests:  Recent Labs Lab 11/20/12 0420 11/21/12 0232  AST 14 12  ALT 11 9  ALKPHOS 75 74  BILITOT 0.2* 0.1*  PROT 7.2 6.8  ALBUMIN 3.2* 2.8*   No results found for this basename: LIPASE, AMYLASE,  in the last 168 hours No results found for this basename: AMMONIA,  in the last 168 hours CBC:  Recent Labs Lab 11/19/12 1329 11/20/12 0420 11/20/12 0948 11/20/12 1745 11/21/12 0232 11/21/12 0955  WBC 10.6* 8.8  --   --  7.6  --   NEUTROABS 7.7  --   --   --   --   --   HGB 15.0 12.8 12.9 12.7 12.3 12.7  HCT 46.2* 41.0 40.9 39.4 39.4 40.0  MCV 88.5 88.9  --   --  89.1  --   PLT 237 185  --   --  171  --    Cardiac Enzymes: No results found for this basename: CKTOTAL, CKMB, CKMBINDEX, TROPONINI,  in the last 168 hours BNP: BNP (last 3 results) No results found for this basename: PROBNP,  in the last 8760 hours CBG: No results found for this basename: GLUCAP,  in the last 168 hours     Signed:  Chamari Cutbirth S  [facility] 11/21/2012, 11:54 AM

## 2012-11-21 NOTE — Progress Notes (Addendum)
Went through discharge education with pt at 1300. Removed IV. Pt did not have further questions. Pt is waiting for her ride home currently. Will continue to monitor.   1755 Pt left with her family member. She had her discharge packet, prescriptions, and belongings.

## 2012-11-24 LAB — STOOL CULTURE

## 2012-12-02 ENCOUNTER — Other Ambulatory Visit: Payer: Self-pay | Admitting: Licensed Clinical Social Worker

## 2012-12-02 DIAGNOSIS — J302 Other seasonal allergic rhinitis: Secondary | ICD-10-CM

## 2012-12-02 DIAGNOSIS — J4521 Mild intermittent asthma with (acute) exacerbation: Secondary | ICD-10-CM

## 2012-12-02 DIAGNOSIS — B20 Human immunodeficiency virus [HIV] disease: Secondary | ICD-10-CM

## 2012-12-02 MED ORDER — ALBUTEROL SULFATE HFA 108 (90 BASE) MCG/ACT IN AERS: 1.0000 | INHALATION_SPRAY | Freq: Two times a day (BID) | RESPIRATORY_TRACT | Status: DC

## 2012-12-02 MED ORDER — MONTELUKAST SODIUM 10 MG PO TABS: 10.0000 mg | ORAL_TABLET | Freq: Every day | ORAL | Status: DC

## 2012-12-02 MED ORDER — LORATADINE 10 MG PO TABS: 10.0000 mg | ORAL_TABLET | Freq: Every day | ORAL | Status: DC

## 2012-12-02 MED ORDER — EFAVIRENZ-EMTRICITAB-TENOFOVIR 600-200-300 MG PO TABS: 1.0000 | ORAL_TABLET | Freq: Every day | ORAL | Status: DC

## 2013-01-31 ENCOUNTER — Telehealth: Payer: Self-pay | Admitting: *Deleted

## 2013-01-31 ENCOUNTER — Ambulatory Visit: Payer: Medicaid Other | Admitting: Internal Medicine

## 2013-01-31 NOTE — Telephone Encounter (Signed)
Left message requesting pt call for appt.

## 2013-03-21 ENCOUNTER — Encounter: Payer: Self-pay | Admitting: *Deleted

## 2013-03-21 NOTE — Progress Notes (Signed)
Patient ID: [name], female   DOB: 07/11/1945, 67 y.o.   MRN: [id number] RW information updated in another electronic system.

## 2013-03-31 ENCOUNTER — Other Ambulatory Visit: Payer: Self-pay | Admitting: Infectious Disease

## 2013-03-31 ENCOUNTER — Other Ambulatory Visit: Payer: Self-pay | Admitting: Internal Medicine

## 2013-03-31 ENCOUNTER — Other Ambulatory Visit: Payer: Self-pay | Admitting: Infectious Diseases

## 2013-03-31 DIAGNOSIS — B20 Human immunodeficiency virus [HIV] disease: Secondary | ICD-10-CM

## 2013-04-28 ENCOUNTER — Other Ambulatory Visit: Payer: Self-pay | Admitting: Internal Medicine

## 2013-04-28 ENCOUNTER — Other Ambulatory Visit: Payer: Self-pay | Admitting: Infectious Diseases

## 2013-05-19 ENCOUNTER — Other Ambulatory Visit: Payer: Self-pay | Admitting: Infectious Diseases

## 2013-05-19 ENCOUNTER — Other Ambulatory Visit: Payer: Self-pay | Admitting: Internal Medicine

## 2013-05-23 ENCOUNTER — Other Ambulatory Visit: Payer: Self-pay | Admitting: Internal Medicine

## 2013-05-23 ENCOUNTER — Telehealth: Payer: Self-pay | Admitting: *Deleted

## 2013-05-23 NOTE — Telephone Encounter (Signed)
Patient hasn't been seen in the office since 06/2012, but was supposed to follow up in 3 months. Additionally, she no-showed in August 2014.  Patient's refill requests have been denied since October, stating that she needs to be seen for additional refills.  Left patient a message informing her that we cannot refill her medication until she is seen in the office, asked her to please call and schedule an appointment as soon as possible. Andree Coss, RN

## 2013-06-30 ENCOUNTER — Telehealth: Payer: Self-pay | Admitting: *Deleted

## 2013-06-30 ENCOUNTER — Other Ambulatory Visit: Payer: Self-pay | Admitting: Internal Medicine

## 2013-06-30 NOTE — Telephone Encounter (Signed)
Patient has been off medication for many months.  Left message on home phone asking patient to call and make an appointment. Landis Gandy, RN

## 2013-07-03 ENCOUNTER — Emergency Department (HOSPITAL_COMMUNITY)
Admission: EM | Admit: 2013-07-03 | Discharge: 2013-07-03 | Disposition: A | Discharge status: Home / Self Care | Payer: Medicare Other | Attending: Emergency Medicine | Admitting: Emergency Medicine

## 2013-07-03 ENCOUNTER — Encounter (HOSPITAL_COMMUNITY): Payer: Self-pay | Admitting: Emergency Medicine

## 2013-07-03 ENCOUNTER — Emergency Department (HOSPITAL_COMMUNITY): Payer: Medicare Other

## 2013-07-03 DIAGNOSIS — J441 Chronic obstructive pulmonary disease with (acute) exacerbation: Secondary | ICD-10-CM | POA: Insufficient documentation

## 2013-07-03 DIAGNOSIS — Z7982 Long term (current) use of aspirin: Secondary | ICD-10-CM | POA: Insufficient documentation

## 2013-07-03 DIAGNOSIS — Z792 Long term (current) use of antibiotics: Secondary | ICD-10-CM | POA: Insufficient documentation

## 2013-07-03 DIAGNOSIS — E785 Hyperlipidemia, unspecified: Secondary | ICD-10-CM | POA: Insufficient documentation

## 2013-07-03 DIAGNOSIS — Z21 Asymptomatic human immunodeficiency virus [HIV] infection status: Secondary | ICD-10-CM | POA: Insufficient documentation

## 2013-07-03 DIAGNOSIS — Z79899 Other long term (current) drug therapy: Secondary | ICD-10-CM | POA: Insufficient documentation

## 2013-07-03 DIAGNOSIS — F17200 Nicotine dependence, unspecified, uncomplicated: Secondary | ICD-10-CM | POA: Insufficient documentation

## 2013-07-03 DIAGNOSIS — J449 Chronic obstructive pulmonary disease, unspecified: Secondary | ICD-10-CM

## 2013-07-03 MED ORDER — OXYCODONE-ACETAMINOPHEN 5-325 MG PO TABS
2.0000 | ORAL_TABLET | Freq: Once | ORAL | Status: AC
  Administered 2013-07-03: 2 via ORAL
  Filled 2013-07-03: qty 2

## 2013-07-03 MED ORDER — PREDNISONE 10 MG PO TABS: 20.0000 mg | ORAL_TABLET | Freq: Every day | ORAL | Status: DC

## 2013-07-03 MED ORDER — IPRATROPIUM BROMIDE 0.02 % IN SOLN
0.5000 mg | Freq: Once | RESPIRATORY_TRACT | Status: AC
  Administered 2013-07-03: 0.5 mg via RESPIRATORY_TRACT
  Filled 2013-07-03: qty 2.5

## 2013-07-03 MED ORDER — OXYCODONE-ACETAMINOPHEN 5-325 MG PO TABS: 2.0000 | ORAL_TABLET | ORAL | Status: DC | PRN

## 2013-07-03 MED ORDER — PREDNISONE 20 MG PO TABS
60.0000 mg | ORAL_TABLET | Freq: Once | ORAL | Status: AC
  Administered 2013-07-03: 60 mg via ORAL
  Filled 2013-07-03: qty 3

## 2013-07-03 MED ORDER — ALBUTEROL (5 MG/ML) CONTINUOUS INHALATION SOLN
10.0000 mg/h | INHALATION_SOLUTION | Freq: Once | RESPIRATORY_TRACT | Status: AC
  Administered 2013-07-03: 10 mg/h via RESPIRATORY_TRACT
  Filled 2013-07-03: qty 20

## 2013-07-03 NOTE — ED Provider Notes (Signed)
CSN: [id number]     Arrival date & time 07/03/13  4174 History   First MD Initiated Contact with Patient 07/03/13 [phone]     Chief Complaint  Patient presents with  . Cough  . Shortness of Breath   (Consider location/radiation/quality/duration/timing/severity/associated sxs/prior Treatment) Patient is a 68 y.o. female presenting with cough and shortness of breath. The history is provided by the patient.  Cough Associated symptoms: shortness of breath   Shortness of Breath Associated symptoms: cough    patient here with cough and congestion x5 days. Cough has been productive of white sputum and associated with sharp chest pain. No pleuritic or anginal type symptoms. No lower extremity edema. No orthopnea but some dyspnea on exertion. She does have a history of COPD and continues to use alcohol products. She has used her inhaler with temporary relief. Denies any history of CHF. Denies any fever, vomiting, diarrhea. Patient's pulse oximetry is as noted but she is wearing nail polish and therefore it is inaccurate  Past Medical History  Diagnosis Date  . COPD (chronic obstructive pulmonary disease)   . Hyperlipidemia   . HIV (human immunodeficiency virus infection)    Past Surgical History  Procedure Laterality Date  . Abcess drainage     Family History  Problem Relation Age of Onset  . Pneumonia Mother   . Cancer Sister   . Hypertension Sister   . Diabetes Sister    History  Substance Use Topics  . Smoking status: Current Every Day Smoker -- 1.00 packs/day    Types: Cigarettes  . Smokeless tobacco: Never Used  . Alcohol Use: No   OB History   Grav Para Term Preterm Abortions TAB SAB Ect Mult Living                 Review of Systems  Respiratory: Positive for cough and shortness of breath.   All other systems reviewed and are negative.    Allergies  Review of patient's allergies indicates no known allergies.  Home Medications   Current Outpatient Rx  Name  Route  Sig   Dispense  Refill  . albuterol (PROVENTIL) (2.5 MG/3ML) 0.083% nebulizer solution   Nebulization   Take 3 mLs (2.5 mg total) by nebulization every 4 (four) hours as needed for wheezing or shortness of breath.   75 mL   5   . aspirin EC 81 MG tablet   Oral   Take 81 mg by mouth daily.         . ATRIPLA 600-200-300 MG per tablet      TAKE 1 TABLET BY MOUTH EVERY DAY AT BEDTIME   30 tablet   0     Pt needs appointment for additional refills   . calcium carbonate (OS-CAL - DOSED IN MG OF ELEMENTAL CALCIUM) 1250 MG tablet   Oral   Take 1 tablet by mouth daily.         . ciprofloxacin (CIPRO) 500 MG tablet   Oral   Take 1 tablet (500 mg total) by mouth 2 (two) times daily.   20 tablet   0   . Fluticasone-Salmeterol (ADVAIR) 500-50 MCG/DOSE AEPB   Inhalation   Inhale 1 puff into the lungs every 12 (twelve) hours.         . gabapentin (NEURONTIN) 300 MG capsule   Oral   Take 300 mg by mouth 3 (three) times daily.         Marland Kitchen loratadine (CLARITIN) 10 MG tablet  TAKE 1 TABLET BY MOUTH EVERY DAY   30 tablet   2   . loratadine (CLARITIN) 10 MG tablet      TAKE 1 TABLET BY MOUTH EVERY DAY   30 tablet   2   . metroNIDAZOLE (FLAGYL) 500 MG tablet   Oral   Take 1 tablet (500 mg total) by mouth 3 (three) times daily.   30 tablet   0   . montelukast (SINGULAIR) 10 MG tablet   Oral   Take 1 tablet (10 mg total) by mouth at bedtime.   30 tablet   6   . oxyCODONE-acetaminophen (PERCOCET/ROXICET) 5-325 MG per tablet   Oral   Take 1 tablet by mouth every 4 (four) hours as needed for pain.         . phenazopyridine (PYRIDIUM) 200 MG tablet   Oral   Take 1 tablet (200 mg total) by mouth 3 (three) times daily.   6 tablet   0   . pravastatin (PRAVACHOL) 20 MG tablet   Oral   Take 20 mg by mouth daily.         Marland Kitchen PROAIR HFA 108 (90 BASE) MCG/ACT inhaler      INHALE 1 PUFF BY MOUTH TWICE DAILY   8.5 g   0     Pt needs appt for additional refills   .  QUEtiapine Fumarate (SEROQUEL XR) 150 MG 24 hr tablet   Oral   Take 150 mg by mouth at bedtime.         . SEROQUEL XR 150 MG 24 hr tablet      TAKE 1 TABLET BY MOUTH EVERY NIGHT AT BEDTIME   30 tablet   0     Pt needs appt for more refills   . traMADol (ULTRAM) 50 MG tablet   Oral   Take 50 mg by mouth every 6 (six) hours as needed for pain.         Marland Kitchen venlafaxine XR (EFFEXOR-XR) 150 MG 24 hr capsule   Oral   Take 150 mg by mouth daily.         . vitamin B-12 (CYANOCOBALAMIN) 250 MCG tablet   Oral   Take 250 mcg by mouth daily.          BP 167/83  Pulse 96  Temp(Src) 98.3 F (36.8 C) (Oral)  Resp 20  SpO2 90% Physical Exam  Nursing note and vitals reviewed. Constitutional: She is oriented to person, place, and time. She appears well-developed and well-nourished.  Non-toxic appearance. No distress.  HENT:  Head: Normocephalic and atraumatic.  Eyes: Conjunctivae, EOM and lids are normal. Pupils are equal, round, and reactive to light.  Neck: Normal range of motion. Neck supple. No tracheal deviation present. No mass present.  Cardiovascular: Normal rate, regular rhythm and normal heart sounds.  Exam reveals no gallop.   No murmur heard. Pulmonary/Chest: Effort normal. No stridor. No respiratory distress. She has decreased breath sounds. She has wheezes. She has no rhonchi. She has no rales.  Abdominal: Soft. Normal appearance and bowel sounds are normal. She exhibits no distension. There is no tenderness. There is no rebound and no CVA tenderness.  Musculoskeletal: Normal range of motion. She exhibits no edema and no tenderness.  Neurological: She is alert and oriented to person, place, and time. She has normal strength. No cranial nerve deficit or sensory deficit. GCS eye subscore is 4. GCS verbal subscore is 5. GCS motor subscore is 6.  Skin: Skin  is warm and dry. No abrasion and no rash noted.  Psychiatric: She has a normal mood and affect. Her speech is normal  and behavior is normal.    ED Course  Procedures (including critical care time) Labs Review Labs Reviewed - No data to display Imaging Review No results found.  EKG Interpretation   None       MDM  No diagnosis found.  Date: 07/03/2013  Rate: 89  Rhythm: normal sinus rhythm  QRS Axis: normal  Intervals: normal  ST/T Wave abnormalities: normal  Conduction Disutrbances:none  Narrative Interpretation:   Old EKG Reviewed: none available    11:50 AM Patient given continuous albuterol treatment and feels better. She is on chronic home oxygen at 2 L and states that she normally runs a pulse ox around 88%. She was also given prednisone by mouth here. She states that she has been noncompliant with her home nebulizers and has been smoking more tobacco products. She would like to go home at this time and states that she will use her home meds, home oxygen, and decrease her tobacco use  Leota Jacobsen, MD 07/03/13 1154

## 2013-07-03 NOTE — ED Notes (Signed)
Pt 85% O2 on RA. Pt placed on 2 lpm Alexis.

## 2013-07-03 NOTE — ED Notes (Signed)
Pt c/o productive cough x5days with white sputum, c/o increase SOB with pain to chest and back from coughing, pt states on PRN O2 2.5l/min/[place], pt in no distress speaking in complete sentences

## 2013-07-03 NOTE — Discharge Instructions (Signed)

## 2013-07-03 NOTE — ED Notes (Signed)
Respiratory called; on way to administer breathing treatment.

## 2013-07-03 NOTE — ED Notes (Signed)
EKG via portable EKG machine. Monitor down.

## 2013-09-05 ENCOUNTER — Other Ambulatory Visit: Payer: Self-pay | Admitting: Internal Medicine

## 2013-09-05 ENCOUNTER — Other Ambulatory Visit: Payer: Self-pay | Admitting: Infectious Diseases

## 2013-09-14 ENCOUNTER — Telehealth: Payer: Self-pay | Admitting: *Deleted

## 2013-09-14 ENCOUNTER — Ambulatory Visit: Payer: Medicare Other | Admitting: Internal Medicine

## 2013-09-14 NOTE — Telephone Encounter (Signed)
Left message asking patient to contact us.  Patient has been out of care.  Will send her to [facility]. Landis Gandy, RN

## 2013-10-11 ENCOUNTER — Other Ambulatory Visit: Payer: Self-pay | Admitting: Internal Medicine

## 2013-10-11 ENCOUNTER — Other Ambulatory Visit: Payer: Self-pay | Admitting: Licensed Clinical Social Worker

## 2013-10-11 DIAGNOSIS — B20 Human immunodeficiency virus [HIV] disease: Secondary | ICD-10-CM

## 2013-10-11 MED ORDER — EFAVIRENZ-EMTRICITAB-TENOFOVIR 600-200-300 MG PO TABS: ORAL_TABLET | ORAL | Status: DC

## 2013-10-11 NOTE — Telephone Encounter (Signed)
[facility] called wanting a refill, I authorized one time refill and asked them to inform the patient that she needs a visit before.

## 2013-10-30 ENCOUNTER — Emergency Department (HOSPITAL_COMMUNITY): Payer: Medicare Other

## 2013-10-30 ENCOUNTER — Encounter (HOSPITAL_COMMUNITY): Payer: Self-pay | Admitting: Emergency Medicine

## 2013-10-30 ENCOUNTER — Inpatient Hospital Stay (HOSPITAL_COMMUNITY)
Admission: EM | Admit: 2013-10-30 | Discharge: 2013-11-06 | DRG: 190 | Disposition: A | Discharge status: Home / Self Care | Payer: Medicare Other | Attending: Internal Medicine | Admitting: Internal Medicine

## 2013-10-30 DIAGNOSIS — Z9119 Patient's noncompliance with other medical treatment and regimen: Secondary | ICD-10-CM

## 2013-10-30 DIAGNOSIS — J4489 Other specified chronic obstructive pulmonary disease: Secondary | ICD-10-CM | POA: Diagnosis present

## 2013-10-30 DIAGNOSIS — R222 Localized swelling, mass and lump, trunk: Secondary | ICD-10-CM | POA: Diagnosis present

## 2013-10-30 DIAGNOSIS — Z79899 Other long term (current) drug therapy: Secondary | ICD-10-CM

## 2013-10-30 DIAGNOSIS — Z91199 Patient's noncompliance with other medical treatment and regimen due to unspecified reason: Secondary | ICD-10-CM

## 2013-10-30 DIAGNOSIS — C3410 Malignant neoplasm of upper lobe, unspecified bronchus or lung: Secondary | ICD-10-CM | POA: Diagnosis present

## 2013-10-30 DIAGNOSIS — N289 Disorder of kidney and ureter, unspecified: Secondary | ICD-10-CM

## 2013-10-30 DIAGNOSIS — R911 Solitary pulmonary nodule: Secondary | ICD-10-CM

## 2013-10-30 DIAGNOSIS — E785 Hyperlipidemia, unspecified: Secondary | ICD-10-CM | POA: Diagnosis present

## 2013-10-30 DIAGNOSIS — Z8249 Family history of ischemic heart disease and other diseases of the circulatory system: Secondary | ICD-10-CM

## 2013-10-30 DIAGNOSIS — F17210 Nicotine dependence, cigarettes, uncomplicated: Secondary | ICD-10-CM | POA: Diagnosis present

## 2013-10-30 DIAGNOSIS — J441 Chronic obstructive pulmonary disease with (acute) exacerbation: Principal | ICD-10-CM | POA: Diagnosis present

## 2013-10-30 DIAGNOSIS — J9601 Acute respiratory failure with hypoxia: Secondary | ICD-10-CM

## 2013-10-30 DIAGNOSIS — N183 Chronic kidney disease, stage 3 unspecified: Secondary | ICD-10-CM | POA: Diagnosis present

## 2013-10-30 DIAGNOSIS — J449 Chronic obstructive pulmonary disease, unspecified: Secondary | ICD-10-CM

## 2013-10-30 DIAGNOSIS — R0902 Hypoxemia: Secondary | ICD-10-CM

## 2013-10-30 DIAGNOSIS — J45901 Unspecified asthma with (acute) exacerbation: Principal | ICD-10-CM | POA: Diagnosis present

## 2013-10-30 DIAGNOSIS — I10 Essential (primary) hypertension: Secondary | ICD-10-CM

## 2013-10-30 DIAGNOSIS — Z9981 Dependence on supplemental oxygen: Secondary | ICD-10-CM

## 2013-10-30 DIAGNOSIS — Z7982 Long term (current) use of aspirin: Secondary | ICD-10-CM

## 2013-10-30 DIAGNOSIS — F17200 Nicotine dependence, unspecified, uncomplicated: Secondary | ICD-10-CM | POA: Diagnosis present

## 2013-10-30 DIAGNOSIS — Z21 Asymptomatic human immunodeficiency virus [HIV] infection status: Secondary | ICD-10-CM | POA: Diagnosis present

## 2013-10-30 DIAGNOSIS — F3289 Other specified depressive episodes: Secondary | ICD-10-CM | POA: Diagnosis present

## 2013-10-30 DIAGNOSIS — J209 Acute bronchitis, unspecified: Secondary | ICD-10-CM

## 2013-10-30 DIAGNOSIS — Z833 Family history of diabetes mellitus: Secondary | ICD-10-CM

## 2013-10-30 DIAGNOSIS — J440 Chronic obstructive pulmonary disease with acute lower respiratory infection: Secondary | ICD-10-CM

## 2013-10-30 DIAGNOSIS — F329 Major depressive disorder, single episode, unspecified: Secondary | ICD-10-CM | POA: Diagnosis present

## 2013-10-30 DIAGNOSIS — J45909 Unspecified asthma, uncomplicated: Secondary | ICD-10-CM

## 2013-10-30 DIAGNOSIS — J301 Allergic rhinitis due to pollen: Secondary | ICD-10-CM

## 2013-10-30 DIAGNOSIS — B20 Human immunodeficiency virus [HIV] disease: Secondary | ICD-10-CM

## 2013-10-30 DIAGNOSIS — J9620 Acute and chronic respiratory failure, unspecified whether with hypoxia or hypercapnia: Secondary | ICD-10-CM | POA: Diagnosis present

## 2013-10-30 DIAGNOSIS — J9600 Acute respiratory failure, unspecified whether with hypoxia or hypercapnia: Secondary | ICD-10-CM

## 2013-10-30 HISTORY — DX: Unspecified asthma, uncomplicated: J45.909

## 2013-10-30 LAB — BASIC METABOLIC PANEL
BUN: 13 mg/dL (ref 6–23)
CO2: 29 meq/L (ref 19–32)
CREATININE: 1.17 mg/dL — AB (ref 0.50–1.10)
Calcium: 8.9 mg/dL (ref 8.4–10.5)
Chloride: 101 mEq/L (ref 96–112)
GFR calc Af Amer: 55 mL/min — ABNORMAL LOW (ref >90)
GFR calc non Af Amer: 48 mL/min — ABNORMAL LOW (ref >90)
GLUCOSE: 111 mg/dL — AB (ref 70–99)
Potassium: 4.2 mEq/L (ref 3.7–5.3)
Sodium: 141 mEq/L (ref 137–147)

## 2013-10-30 LAB — CBC
HEMATOCRIT: 47.8 % — AB (ref 36.0–46.0)
Hemoglobin: 15.1 g/dL — ABNORMAL HIGH (ref 12.0–15.0)
MCH: 28.8 pg (ref 26.0–34.0)
MCHC: 31.6 g/dL (ref 30.0–36.0)
MCV: 91.2 fL (ref 78.0–100.0)
Platelets: 244 10*3/uL (ref 150–400)
RBC: 5.24 MIL/uL — AB (ref 3.87–5.11)
RDW: 15.5 % (ref 11.5–15.5)
WBC: 10.4 10*3/uL (ref 4.0–10.5)

## 2013-10-30 LAB — I-STAT TROPONIN, ED: Troponin i, poc: 0 ng/mL (ref 0.00–0.08)

## 2013-10-30 LAB — PRO B NATRIURETIC PEPTIDE: Pro B Natriuretic peptide (BNP): 118.6 pg/mL (ref 0–125)

## 2013-10-30 MED ORDER — ALBUTEROL SULFATE (2.5 MG/3ML) 0.083% IN NEBU: 2.5000 mg | INHALATION_SOLUTION | RESPIRATORY_TRACT | Status: DC | PRN

## 2013-10-30 MED ORDER — VENLAFAXINE HCL ER 150 MG PO CP24
150.0000 mg | ORAL_CAPSULE | Freq: Every day | ORAL | Status: DC
  Administered 2013-10-30 – 2013-11-06 (×8): 150 mg via ORAL
  Filled 2013-10-30 (×8): qty 1

## 2013-10-30 MED ORDER — ALBUTEROL SULFATE (2.5 MG/3ML) 0.083% IN NEBU
5.0000 mg | INHALATION_SOLUTION | Freq: Once | RESPIRATORY_TRACT | Status: AC
  Administered 2013-10-30: 5 mg via RESPIRATORY_TRACT
  Filled 2013-10-30: qty 6

## 2013-10-30 MED ORDER — ALBUTEROL SULFATE (2.5 MG/3ML) 0.083% IN NEBU
2.5000 mg | INHALATION_SOLUTION | RESPIRATORY_TRACT | Status: DC | PRN
  Filled 2013-10-30: qty 3

## 2013-10-30 MED ORDER — CYANOCOBALAMIN 250 MCG PO TABS
250.0000 ug | ORAL_TABLET | Freq: Every day | ORAL | Status: DC
  Administered 2013-10-31 – 2013-11-06 (×7): 250 ug via ORAL
  Filled 2013-10-30 (×7): qty 1

## 2013-10-30 MED ORDER — IPRATROPIUM-ALBUTEROL 0.5-2.5 (3) MG/3ML IN SOLN
3.0000 mL | Freq: Four times a day (QID) | RESPIRATORY_TRACT | Status: DC
  Administered 2013-10-30: 3 mL via RESPIRATORY_TRACT
  Filled 2013-10-30: qty 3

## 2013-10-30 MED ORDER — LORATADINE 10 MG PO TABS
10.0000 mg | ORAL_TABLET | Freq: Every day | ORAL | Status: DC
  Administered 2013-10-30 – 2013-11-06 (×8): 10 mg via ORAL
  Filled 2013-10-30 (×8): qty 1

## 2013-10-30 MED ORDER — ACETAMINOPHEN 325 MG PO TABS: 650.0000 mg | ORAL_TABLET | Freq: Four times a day (QID) | ORAL | Status: DC | PRN

## 2013-10-30 MED ORDER — ONDANSETRON HCL 4 MG PO TABS: 4.0000 mg | ORAL_TABLET | Freq: Four times a day (QID) | ORAL | Status: DC | PRN

## 2013-10-30 MED ORDER — MONTELUKAST SODIUM 10 MG PO TABS
10.0000 mg | ORAL_TABLET | Freq: Every day | ORAL | Status: DC
  Administered 2013-10-30 – 2013-11-05 (×7): 10 mg via ORAL
  Filled 2013-10-30 (×9): qty 1

## 2013-10-30 MED ORDER — EFAVIRENZ-EMTRICITAB-TENOFOVIR 600-200-300 MG PO TABS
1.0000 | ORAL_TABLET | Freq: Every day | ORAL | Status: DC
  Administered 2013-10-30 – 2013-11-05 (×7): 1 via ORAL
  Filled 2013-10-30 (×9): qty 1

## 2013-10-30 MED ORDER — HYDROCODONE-ACETAMINOPHEN 5-325 MG PO TABS
1.0000 | ORAL_TABLET | ORAL | Status: DC | PRN
  Administered 2013-10-30 – 2013-11-06 (×23): 2 via ORAL
  Filled 2013-10-30 (×23): qty 2

## 2013-10-30 MED ORDER — MORPHINE SULFATE 2 MG/ML IJ SOLN
1.0000 mg | INTRAMUSCULAR | Status: DC | PRN
  Filled 2013-10-30: qty 1

## 2013-10-30 MED ORDER — METHYLPREDNISOLONE SODIUM SUCC 125 MG IJ SOLR
60.0000 mg | Freq: Four times a day (QID) | INTRAMUSCULAR | Status: AC
  Administered 2013-10-30 – 2013-10-31 (×4): 60 mg via INTRAVENOUS
  Filled 2013-10-30 (×6): qty 0.96

## 2013-10-30 MED ORDER — LEVOFLOXACIN 500 MG PO TABS
500.0000 mg | ORAL_TABLET | Freq: Once | ORAL | Status: AC
  Administered 2013-10-30: 500 mg via ORAL
  Filled 2013-10-30: qty 1

## 2013-10-30 MED ORDER — ACETAMINOPHEN 500 MG PO TABS
1000.0000 mg | ORAL_TABLET | Freq: Once | ORAL | Status: AC
  Administered 2013-10-30: 1000 mg via ORAL
  Filled 2013-10-30: qty 2

## 2013-10-30 MED ORDER — ALBUTEROL SULFATE (2.5 MG/3ML) 0.083% IN NEBU: 2.5000 mg | INHALATION_SOLUTION | Freq: Four times a day (QID) | RESPIRATORY_TRACT | Status: DC

## 2013-10-30 MED ORDER — MOMETASONE FURO-FORMOTEROL FUM 200-5 MCG/ACT IN AERO
2.0000 | INHALATION_SPRAY | Freq: Two times a day (BID) | RESPIRATORY_TRACT | Status: DC
  Administered 2013-10-30 – 2013-11-01 (×3): 2 via RESPIRATORY_TRACT
  Filled 2013-10-30: qty 8.8

## 2013-10-30 MED ORDER — IPRATROPIUM BROMIDE 0.02 % IN SOLN: 0.5000 mg | Freq: Four times a day (QID) | RESPIRATORY_TRACT | Status: DC

## 2013-10-30 MED ORDER — ENOXAPARIN SODIUM 40 MG/0.4ML ~~LOC~~ SOLN
40.0000 mg | SUBCUTANEOUS | Status: DC
  Administered 2013-10-30 – 2013-11-05 (×7): 40 mg via SUBCUTANEOUS
  Filled 2013-10-30 (×9): qty 0.4

## 2013-10-30 MED ORDER — ASPIRIN EC 81 MG PO TBEC
81.0000 mg | DELAYED_RELEASE_TABLET | Freq: Every day | ORAL | Status: DC
  Administered 2013-10-30 – 2013-11-06 (×8): 81 mg via ORAL
  Filled 2013-10-30 (×8): qty 1

## 2013-10-30 MED ORDER — SODIUM CHLORIDE 0.9 % IJ SOLN: 3.0000 mL | INTRAMUSCULAR | Status: DC | PRN

## 2013-10-30 MED ORDER — SODIUM CHLORIDE 0.9 % IV BOLUS (SEPSIS)
500.0000 mL | Freq: Once | INTRAVENOUS | Status: AC
  Administered 2013-10-30: 500 mL via INTRAVENOUS

## 2013-10-30 MED ORDER — SODIUM CHLORIDE 0.9 % IV SOLN: 250.0000 mL | INTRAVENOUS | Status: DC | PRN

## 2013-10-30 MED ORDER — METHYLPREDNISOLONE SODIUM SUCC 125 MG IJ SOLR
125.0000 mg | Freq: Once | INTRAMUSCULAR | Status: AC
  Administered 2013-10-30: 125 mg via INTRAVENOUS
  Filled 2013-10-30: qty 2

## 2013-10-30 MED ORDER — IPRATROPIUM-ALBUTEROL 0.5-2.5 (3) MG/3ML IN SOLN
3.0000 mL | Freq: Once | RESPIRATORY_TRACT | Status: AC
  Administered 2013-10-30: 3 mL via RESPIRATORY_TRACT
  Filled 2013-10-30: qty 3

## 2013-10-30 MED ORDER — IPRATROPIUM-ALBUTEROL 0.5-2.5 (3) MG/3ML IN SOLN
3.0000 mL | Freq: Three times a day (TID) | RESPIRATORY_TRACT | Status: DC
  Administered 2013-10-31 – 2013-11-04 (×12): 3 mL via RESPIRATORY_TRACT
  Filled 2013-10-30 (×12): qty 3

## 2013-10-30 MED ORDER — GUAIFENESIN ER 600 MG PO TB12
1200.0000 mg | ORAL_TABLET | Freq: Two times a day (BID) | ORAL | Status: DC
  Administered 2013-10-30 – 2013-11-06 (×14): 1200 mg via ORAL
  Filled 2013-10-30 (×16): qty 2

## 2013-10-30 MED ORDER — ACETAMINOPHEN 650 MG RE SUPP: 650.0000 mg | Freq: Four times a day (QID) | RECTAL | Status: DC | PRN

## 2013-10-30 MED ORDER — GABAPENTIN 300 MG PO CAPS
300.0000 mg | ORAL_CAPSULE | Freq: Three times a day (TID) | ORAL | Status: DC
  Administered 2013-10-30 – 2013-11-06 (×20): 300 mg via ORAL
  Filled 2013-10-30 (×23): qty 1

## 2013-10-30 MED ORDER — LEVOFLOXACIN IN D5W 500 MG/100ML IV SOLN
500.0000 mg | INTRAVENOUS | Status: DC
  Administered 2013-10-31: 500 mg via INTRAVENOUS
  Filled 2013-10-30: qty 100

## 2013-10-30 MED ORDER — SODIUM CHLORIDE 0.9 % IJ SOLN
3.0000 mL | Freq: Two times a day (BID) | INTRAMUSCULAR | Status: DC
  Administered 2013-10-30 – 2013-11-05 (×12): 3 mL via INTRAVENOUS

## 2013-10-30 MED ORDER — ONDANSETRON HCL 4 MG/2ML IJ SOLN: 4.0000 mg | Freq: Four times a day (QID) | INTRAMUSCULAR | Status: DC | PRN

## 2013-10-30 NOTE — ED Notes (Signed)
Respiratory called for duoneb

## 2013-10-30 NOTE — ED Notes (Signed)
Attempted to call report a second time, RN never answered and Network engineer says that the RN will have to call back.

## 2013-10-30 NOTE — H&P (Signed)
[facility]                                                                                    Patient Demographics  [name], is a 68 y.o. female  CSN: [id number]  MRN: [id number]  DOB - 1945-09-01  Admit Date - 10/30/2013  Outpatient Primary MD for the patient is [name], MD   With History of -  Past Medical History  Diagnosis Date  . COPD (chronic obstructive pulmonary disease)   . Hyperlipidemia   . HIV (human immunodeficiency virus infection)   . Asthma       Past Surgical History  Procedure Laterality Date  . Abcess drainage      abcess under Left arm, abcess from L breast  . Thyroid gland removed    . Removal of abnormal cells on uterus      in for   Chief Complaint  Patient presents with  . Shortness of Breath  . Cough     HPI  [name]  is a 68 y.o. female, with history of asthma, COPD ,and smoking presenting with one month history of increasing shortness of breath and chest congestion. No history of fever chills but complains of some pleuritic chest pains in the right times . In the emergency room she improved with neb treatments and steroids however the patient is only on home oxygen at night and she was hypoxemic without oxygen and I was called to admit Patient reports dry cough, no blood in his sputum .    Review of Systems    In addition to the HPI above,  No Fever-chills, No Headache, No changes with Vision or hearing, No problems swallowing food or Liquids, No Abdominal pain, No Nausea or Vommitting, Bowel movements are regular, No Blood in stool or Urine, No dysuria, No new skin rashes or bruises, No new joints pains-aches,  No new weakness, tingling, numbness in any extremity, No recent weight gain or loss, No polyuria, polydypsia or polyphagia, No significant Mental Stressors.  A full 10 point Review of Systems was done, except as stated above, all other Review of Systems were negative.   Social  History History  Substance Use Topics  . Smoking status: Current Every Day Smoker -- 1.00 packs/day for 40 years    Types: Cigarettes  . Smokeless tobacco: Never Used  . Alcohol Use: No     Comment: none in 20 years     Family History Family History  Problem Relation Age of Onset  . Pneumonia Mother   . Cancer Sister   . Hypertension Sister   . Diabetes Sister      Prior to Admission medications   Medication Sig Start Date End Date Taking? Authorizing Provider  albuterol (PROVENTIL HFA;VENTOLIN HFA) 108 (90 BASE) MCG/ACT inhaler Inhale into the lungs 2 (two) times daily.   Yes Historical Provider, MD  albuterol (PROVENTIL) (2.5 MG/3ML) 0.083% nebulizer solution Take 3 mLs (2.5 mg total) by nebulization every 4 (four) hours as needed for wheezing or shortness of breath. 10/04/12  Yes Michel Bickers, MD  aspirin EC 81 MG tablet Take 81 mg by mouth daily.   Yes  Historical Provider, MD  efavirenz-emtricitabine-tenofovir (ATRIPLA) [phone] MG per tablet Take 1 tablet by mouth at bedtime.   Yes Historical Provider, MD  Fluticasone-Salmeterol (ADVAIR) 500-50 MCG/DOSE AEPB Inhale 1 puff into the lungs every 12 (twelve) hours.   Yes Historical Provider, MD  gabapentin (NEURONTIN) 300 MG capsule Take 300 mg by mouth 3 (three) times daily.   Yes Historical Provider, MD  loratadine (CLARITIN) 10 MG tablet Take 10 mg by mouth daily.   Yes Historical Provider, MD  montelukast (SINGULAIR) 10 MG tablet Take 1 tablet (10 mg total) by mouth at bedtime. 12/02/12  Yes Michel Bickers, MD  oxyCODONE-acetaminophen (PERCOCET/ROXICET) 5-325 MG per tablet Take 1 tablet by mouth every 4 (four) hours as needed for pain.   Yes Historical Provider, MD  venlafaxine XR (EFFEXOR-XR) 150 MG 24 hr capsule Take 150 mg by mouth daily.   Yes Historical Provider, MD  vitamin B-12 (CYANOCOBALAMIN) 250 MCG tablet Take 250 mcg by mouth daily.   Yes Historical Provider, MD    No Known Allergies  Physical  Exam  Vitals  Blood pressure 149/82, pulse 82, temperature 98.8 F (37.1 C), temperature source Oral, resp. rate 24, SpO2 80.00%.   1. General  V.pleasant African American female lives in moderate shortness of breath  2. Normal affect and insight, Not Suicidal or Homicidal, Awake Alert, Oriented X 3.  3. No F.N deficits, ALL C.Nerves Intact, Strength 5/5 all 4 extremities, Sensation intact all 4 extremities, Plantars down going.  4. Ears and Eyes appear Normal, Conjunctivae clear, PERRLA. Moist Oral Mucosa.  5. Supple Neck, No JVD, No cervical lymphadenopathy appriciated, No Carotid Bruits.  6. Symmetrical Chest wall movement,  scattered rhonchi and wheezing .  7. RRR, No Gallops, Rubs or Murmurs, No Parasternal Heave.  8. Positive Bowel Sounds, Abdomen Soft, Non tender, No organomegaly appriciated,No rebound -guarding or rigidity.  9.  No Cyanosis, Normal Skin Turgor, No Skin Rash or Bruise.  10. Good muscle tone,  joints appear normal , no effusions, Normal ROM.  11. No Palpable Lymph Nodes in Neck or Axillae    Data Review  CBC  Recent Labs Lab 10/30/13 1505  WBC 10.4  HGB 15.1*  HCT 47.8*  PLT 244  MCV 91.2  MCH 28.8  MCHC 31.6  RDW 15.5   ------------------------------------------------------------------------------------------------------------------  Chemistries   Recent Labs Lab 10/30/13 1505  NA 141  K 4.2  CL 101  CO2 29  GLUCOSE 111*  BUN 13  CREATININE 1.17*  CALCIUM 8.9   ------------------------------------------------------------------------------------------------------------------ CrCl is unknown because both a height and weight (above a minimum accepted value) are required for this calculation. ------------------------------------------------------------------------------------------------------------------ No results found for this basename: TSH, T4TOTAL, FREET3, T3FREE, THYROIDAB,  in the last 72 hours   Coagulation profile No  results found for this basename: INR, PROTIME,  in the last 168 hours ------------------------------------------------------------------------------------------------------------------- No results found for this basename: DDIMER,  in the last 72 hours -------------------------------------------------------------------------------------------------------------------  Cardiac Enzymes No results found for this basename: CK, CKMB, TROPONINI, MYOGLOBIN,  in the last 168 hours ------------------------------------------------------------------------------------------------------------------ No components found with this basename: POCBNP,    ---------------------------------------------------------------------------------------------------------------  Urinalysis    Component Value Date/Time   COLORURINE YELLOW 10/28/2012 1750   APPEARANCEUR TURBID* 10/28/2012 1750   LABSPEC 1.022 10/28/2012 1750   PHURINE 5.5 10/28/2012 1750   GLUCOSEU NEGATIVE 10/28/2012 1750   HGBUR LARGE* 10/28/2012 1750   BILIRUBINUR NEGATIVE 10/28/2012 1750   KETONESUR NEGATIVE 10/28/2012 1750   PROTEINUR >300* 10/28/2012 1750   UROBILINOGEN 1.0  10/28/2012 1750   NITRITE POSITIVE* 10/28/2012 1750   LEUKOCYTESUR LARGE* 10/28/2012 1750    ----------------------------------------------------------------------------------------------------------------     Imaging results:   Dg Chest 2 View (if Patient Has Fever And/or Copd)  10/30/2013   CLINICAL DATA:  Shortness of breath, cough, right-sided chest pain  EXAM: CHEST  2 VIEW  COMPARISON:  07/03/2013  FINDINGS: Chronic interstitial markings/bronchitic changes. Mild chronic linear/patchy opacity in the left upper lobe and lingula, similar to 2013, likely scarring.  No focal consolidation.  No pleural effusion or pneumothorax.  The heart is normal in size.  Mild degenerative changes of the visualized thoracolumbar spine.  IMPRESSION: No evidence of acute cardiopulmonary disease.  Chronic  bronchitic changes with left upper lobe/lingular scarring.   Electronically Signed   By: Julian Hy M.D.   On: 10/30/2013 15:58    My personal review of EKG: Normal sinus rhythm at a rate of 59 with nonspecific T-wave changes, right axis deviation    Assessment & Plan  1.  COPD exacerbation/bronchitis 2. history of asthma 3. history of HIV  Plan Nebulizer treatments IV Solu-Medrol Levaquin IV Place on oxygen Continue home medications Consult social worker for oxygen requirements at home/patient is only on nighttime oxygen.  DVT Prophylaxis  Lovenox  AM Labs Ordered, also please review Full Orders  Code Status  full  Disposition Plan: home Time spent in minutes :31 minutes  Condition GUARDED   @SIGNATURE @

## 2013-10-30 NOTE — Progress Notes (Signed)
  CARE MANAGEMENT ED NOTE 10/30/2013  Patient:  IFEOLUWA, BARTZ   Account Number:  [id number]  Date Initiated:  10/30/2013  Documentation initiated by:  Livia Snellen  Subjective/Objective Assessment:   Patient presents to Ed with shortness of breath and cough     Subjective/Objective Assessment Detail:   Patient with pmhx of COPD, pulse ox on room air 80%.     Action/Plan:   Action/Plan Detail:   Anticipated DC Date:       Status Recommendation to Physician:   Result of Recommendation:    Other ED Services  Consult [facility] [place]  CM consult  Other  PCP issues    Choice offered to / List presented to:            Status of service:  Completed, signed off  ED Comments:   ED Comments Detail:  EDCM spoke to patient at bedside regarding her oxygen at home.  Patient reports she wears her oxygen at night and dors not have a pcp.  Patient reports [facility] is the agency she received the oxygen concentrator from but, "They dropped me after I turned 67."  Patient reports she cannot go anywhere during the day because she does not have a portable tank.  Patient reports, "I called [employer] and they told me I need to go to my doctor so they can walk me and get my oxygen levels so that I can get a portable tank."  [facility] provided patient with list of pcps who accept Medicare insurnace within a 5-10 mile radius of patient's zip code.  Patient thankful for resources.  No further EDCM needs at this time.

## 2013-10-30 NOTE — ED Notes (Addendum)
Pt moved from triage to room and placed on O2 and nurse is aware.

## 2013-10-30 NOTE — ED Notes (Signed)
Pt reports cough and congestion for 1 month. Cough is productive. Pt reports SOB with exertion. Pt denies fever. Pt denies n/v/d.

## 2013-10-30 NOTE — ED Provider Notes (Signed)
CSN: [id number]     Arrival date & time 10/30/13  1409 History   First MD Initiated Contact with Patient 10/30/13 1459     Chief Complaint  Patient presents with  . Shortness of Breath  . Cough     (Consider location/radiation/quality/duration/timing/severity/associated sxs/prior Treatment) HPI  This a 68 year old female with history of COPD, hyperlipidemia, and HIV who presents with cough. Patient reports one month history of progressive cough and congestion. She states the cough is productive of green sputum. She denies fever chills. She reports shortness of breath and chest pain that is worse with coughing. She's been taking her albuterol at home without improvement. She is on oxygen at home but only at night. Noted to have an O2 sat of 80% on room air upon arrival.  Patient is currently on a DuoNeb.  Patient states that in the past she does require oxygen all the time but doesn't currently have a doctor and no longer has home health to bring her oxygen. Past Medical History  Diagnosis Date  . COPD (chronic obstructive pulmonary disease)   . Hyperlipidemia   . HIV (human immunodeficiency virus infection)   . Asthma    Past Surgical History  Procedure Laterality Date  . Abcess drainage     Family History  Problem Relation Age of Onset  . Pneumonia Mother   . Cancer Sister   . Hypertension Sister   . Diabetes Sister    History  Substance Use Topics  . Smoking status: Current Every Day Smoker -- 1.00 packs/day    Types: Cigarettes  . Smokeless tobacco: Never Used  . Alcohol Use: No   OB History   Grav Para Term Preterm Abortions TAB SAB Ect Mult Living                 Review of Systems  Constitutional: Negative for fever and chills.  Respiratory: Positive for cough, chest tightness, shortness of breath and wheezing.   Cardiovascular: Positive for chest pain. Negative for leg swelling.  Gastrointestinal: Negative for nausea, vomiting and abdominal pain.   Genitourinary: Negative for dysuria.  Musculoskeletal: Negative for back pain.  Skin: Negative for rash.  Neurological: Positive for headaches.  Psychiatric/Behavioral: Negative for confusion.  All other systems reviewed and are negative.     Allergies  Review of patient's allergies indicates no known allergies.  Home Medications   Prior to Admission medications   Medication Sig Start Date End Date Taking? Authorizing Provider  albuterol (PROVENTIL HFA;VENTOLIN HFA) 108 (90 BASE) MCG/ACT inhaler Inhale into the lungs 2 (two) times daily.   Yes Historical Provider, MD  albuterol (PROVENTIL) (2.5 MG/3ML) 0.083% nebulizer solution Take 3 mLs (2.5 mg total) by nebulization every 4 (four) hours as needed for wheezing or shortness of breath. 10/04/12  Yes Michel Bickers, MD  aspirin EC 81 MG tablet Take 81 mg by mouth daily.   Yes Historical Provider, MD  efavirenz-emtricitabine-tenofovir (ATRIPLA) 600-200-300 MG per tablet Take 1 tablet by mouth at bedtime.   Yes Historical Provider, MD  Fluticasone-Salmeterol (ADVAIR) 500-50 MCG/DOSE AEPB Inhale 1 puff into the lungs every 12 (twelve) hours.   Yes Historical Provider, MD  gabapentin (NEURONTIN) 300 MG capsule Take 300 mg by mouth 3 (three) times daily.   Yes Historical Provider, MD  loratadine (CLARITIN) 10 MG tablet Take 10 mg by mouth daily.   Yes Historical Provider, MD  montelukast (SINGULAIR) 10 MG tablet Take 1 tablet (10 mg total) by mouth at bedtime. 12/02/12  Yes  Michel Bickers, MD  oxyCODONE-acetaminophen (PERCOCET/ROXICET) 5-325 MG per tablet Take 1 tablet by mouth every 4 (four) hours as needed for pain.   Yes Historical Provider, MD  venlafaxine XR (EFFEXOR-XR) 150 MG 24 hr capsule Take 150 mg by mouth daily.   Yes Historical Provider, MD  vitamin B-12 (CYANOCOBALAMIN) 250 MCG tablet Take 250 mcg by mouth daily.   Yes Historical Provider, MD   BP 149/82  Pulse 82  Temp(Src) 98.8 F (37.1 C) (Oral)  Resp 24  SpO2 80% Physical  Exam  Nursing note and vitals reviewed. Constitutional: She is oriented to person, place, and time. She appears well-developed and well-nourished. No distress.  Overweight  HENT:  Head: Normocephalic and atraumatic.  Mouth/Throat: Oropharynx is clear and moist.  Eyes: Pupils are equal, round, and reactive to light.  Neck: Neck supple. No JVD present.  Cardiovascular: Normal rate, regular rhythm and normal heart sounds.   No murmur heard. Pulmonary/Chest: Effort normal. No respiratory distress. She has wheezes. She has rales.  Abdominal: Soft. Bowel sounds are normal. There is no tenderness.  Musculoskeletal: She exhibits no edema.  Neurological: She is alert and oriented to person, place, and time.  Skin: Skin is warm and dry.  Psychiatric: She has a normal mood and affect.    ED Course  Procedures (including critical care time) Labs Review Labs Reviewed  CBC - Abnormal; Notable for the following:    RBC 5.24 (*)    Hemoglobin 15.1 (*)    HCT 47.8 (*)    All other components within normal limits  BASIC METABOLIC PANEL - Abnormal; Notable for the following:    Glucose, Bld 111 (*)    Creatinine, Ser 1.17 (*)    GFR calc non Af Amer 48 (*)    GFR calc Af Amer 55 (*)    All other components within normal limits  PRO B NATRIURETIC PEPTIDE  I-STAT TROPOININ, ED    Imaging Review Dg Chest 2 View (if Patient Has Fever And/or Copd)  10/30/2013   CLINICAL DATA:  Shortness of breath, cough, right-sided chest pain  EXAM: CHEST  2 VIEW  COMPARISON:  07/03/2013  FINDINGS: Chronic interstitial markings/bronchitic changes. Mild chronic linear/patchy opacity in the left upper lobe and lingula, similar to 2013, likely scarring.  No focal consolidation.  No pleural effusion or pneumothorax.  The heart is normal in size.  Mild degenerative changes of the visualized thoracolumbar spine.  IMPRESSION: No evidence of acute cardiopulmonary disease.  Chronic bronchitic changes with left upper  lobe/lingular scarring.   Electronically Signed   By: Julian Hy M.D.   On: 10/30/2013 15:58     EKG Interpretation   Date/Time:  Monday Oct 30 2013 14:57:19 EDT Ventricular Rate:  89 PR Interval:  163 QRS Duration: 90 QT Interval:  375 QTC Calculation: 456 R Axis:   121 Text Interpretation:  Sinus rhythm Right axis deviation Nonspecific T  abnormalities, lateral leads Confirmed by HORTON  MD, [place] ([id number]) on  10/30/2013 3:45:24 PM      MDM   Final diagnoses:  COPD (chronic obstructive pulmonary disease) with acute bronchitis  Hypoxia   Patient presents with persistent cough, congestion, and wheezing.  Noted to be 80% on room air. Does have home oxygen but only at night. Patient is wheezy on initial exam. She's currently getting a duo neb. Patient was given Solu-Medrol. On repeat exam after 2 nebulizer treatment, patient reports improvement. Without oxygen, she dropped her O2 sats to the mid 70s.  She states that she is not her primary physician and has required oxygen throughout the day in the past. Patient was given Levaquin for presumed COPD exacerbation. Will be admitted given hypoxia and oxygen requirement.   Merryl Hacker, MD 10/30/13 [phone]

## 2013-10-30 NOTE — ED Notes (Signed)
Attempted to call report, RN never came to phone.

## 2013-10-31 DIAGNOSIS — J9600 Acute respiratory failure, unspecified whether with hypoxia or hypercapnia: Secondary | ICD-10-CM

## 2013-10-31 DIAGNOSIS — B20 Human immunodeficiency virus [HIV] disease: Secondary | ICD-10-CM

## 2013-10-31 DIAGNOSIS — J9601 Acute respiratory failure with hypoxia: Secondary | ICD-10-CM

## 2013-10-31 DIAGNOSIS — J441 Chronic obstructive pulmonary disease with (acute) exacerbation: Secondary | ICD-10-CM

## 2013-10-31 DIAGNOSIS — N289 Disorder of kidney and ureter, unspecified: Secondary | ICD-10-CM

## 2013-10-31 MED ORDER — METHYLPREDNISOLONE SODIUM SUCC 125 MG IJ SOLR
60.0000 mg | Freq: Two times a day (BID) | INTRAMUSCULAR | Status: DC
  Administered 2013-11-01 – 2013-11-02 (×3): 60 mg via INTRAVENOUS
  Filled 2013-10-31 (×5): qty 0.96

## 2013-10-31 MED ORDER — NICOTINE 21 MG/24HR TD PT24
21.0000 mg | MEDICATED_PATCH | Freq: Every day | TRANSDERMAL | Status: DC
  Administered 2013-10-31 – 2013-11-06 (×7): 21 mg via TRANSDERMAL
  Filled 2013-10-31 (×7): qty 1

## 2013-10-31 MED ORDER — LEVOFLOXACIN 750 MG PO TABS
750.0000 mg | ORAL_TABLET | Freq: Every day | ORAL | Status: DC
  Administered 2013-11-01 – 2013-11-06 (×6): 750 mg via ORAL
  Filled 2013-10-31 (×6): qty 1

## 2013-10-31 NOTE — Progress Notes (Signed)
PROGRESS NOTE  Michelle PANGILINAN VFI:[id number] DOB: 12/16/1945 DOA: 10/30/2013 PCP: [name], MD  Interim summary 68 year old female with history of COPD, continued tobacco use, HIV presents with one-month history of progressive cough and chest congestion. She stated that she has had green sputum production. Over the past 24-48 hours, the patient has had increasing shortness breath without improvement on albuterol home. The patient continues to smoke one pack per day. In emergency department, the patient had oxygen saturation of 80% upon arrival. The patient was started on Solu-Medrol and levofloxacin. She states that she has not been compliant with her followup visits to Dr. Megan Salon. Assessment/Plan: Acute respiratory failure -Secondary to COPD exacerbation -Supplemental oxygen COPD exacerbation -Continue IV steroids--decrease to bid -Aerosolized albuterol and Atrovent -Pulmonary hygiene -Continue LABA -change levofloxacin to po HIV -Continue Atripla -no HIV RNA or CD4 count recently-->will order -follow Dr. 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 Mild chronic linear/patchy opacity in the left upper lobe and lingula, similar to 2013, likely scarring.  No focal consolidation.  No pleural effusion or pneumothorax.  The heart is normal in size.  Mild degenerative changes of the visualized thoracolumbar spine.  IMPRESSION: No evidence of acute  cardiopulmonary disease.  Chronic bronchitic changes with left upper lobe/lingular scarring.   Electronically Signed   By: Julian Hy M.D.   On: 10/30/2013 15:58         Subjective: Patient is breathing better. She denies fevers, chills, dizziness, nausea, vomiting, diarrhea, abdominal pain. She has some chest discomfort with coughing.  Objective: Filed Vitals:   10/30/13 2017 10/30/13 2028 10/31/13 0534 10/31/13 0944  BP: 138/68  126/50   Pulse: 79  89   Temp: 98.3 F (36.8 C)  98.6 F (37 C)   TempSrc: Oral  Oral   Resp: 18  18   Height:  5\' 9"  (1.753 m)    Weight:  102.8 kg (226 lb 10.1 oz)    SpO2: 89%  90% 94%    Intake/Output Summary (Last 24 hours) at 10/31/13 1427 Last data filed at 10/31/13 0844  Gross per 24 hour  Intake    240 ml  Output      0 ml  Net    240 ml   Weight change:  Exam:   General:  Pt is alert, follows commands appropriately, not in acute distress  HEENT: No icterus, No thrush, [place]/AT  Cardiovascular: RRR, S1/S2, no rubs, no gallops  Respiratory: Bibasilar wheezes. Good air movement.  Abdomen: Soft/+BS, non tender, non distended, no guarding  Extremities: No edema, No lymphangitis, No petechiae, No rashes, no synovitis  Data Reviewed: Basic Metabolic Panel:  Recent Labs Lab 10/30/13 1505  NA 141  K 4.2  CL 101  CO2 29  GLUCOSE 111*  BUN 13  CREATININE 1.17*  CALCIUM 8.9   Liver Function Tests: No results found for this basename: AST, ALT, ALKPHOS, BILITOT, PROT, ALBUMIN,  in the last 168 hours No results found for  this basename: LIPASE, AMYLASE,  in the last 168 hours No results found for this basename: AMMONIA,  in the last 168 hours CBC:  Recent Labs Lab 10/30/13 1505  WBC 10.4  HGB 15.1*  HCT 47.8*  MCV 91.2  PLT 244   Cardiac Enzymes: No results found for this basename: CKTOTAL, CKMB, CKMBINDEX, TROPONINI,  in the last 168 hours BNP: No components found with this basename: POCBNP,  CBG: No results  found for this basename: GLUCAP,  in the last 168 hours  No results found for this or any previous visit (from the past 240 hour(s)).   Scheduled Meds: . aspirin EC  81 mg Oral Daily  . efavirenz-emtricitabine-tenofovir  1 tablet Oral QHS  . enoxaparin (LOVENOX) injection  40 mg Subcutaneous Q24H  . gabapentin  300 mg Oral TID  . guaiFENesin  1,200 mg Oral BID  . ipratropium-albuterol  3 mL Nebulization TID  . levofloxacin (LEVAQUIN) IV  500 mg Intravenous Q24H  . loratadine  10 mg Oral Daily  . methylPREDNISolone (SOLU-MEDROL) injection  60 mg Intravenous Q6H  . mometasone-formoterol  2 puff Inhalation BID  . montelukast  10 mg Oral QHS  . sodium chloride  3 mL Intravenous Q12H  . venlafaxine XR  150 mg Oral Daily  . vitamin B-12  250 mcg Oral Daily   Continuous Infusions:    Geraldo Docker  [facility] Pager [phone]  If 7PM-7AM, please contact night-coverage [email or web address] Password [email or web address] 10/31/2013, 2:27 PM   LOS: 1 day

## 2013-10-31 NOTE — Progress Notes (Signed)
Clinical Social Work  CSW received inappropriate referral to assist with home oxygen. CM aware of possible needs. CSW is signing off but available if further needs arise.  [place], [place] [phone]

## 2013-11-01 LAB — BASIC METABOLIC PANEL
BUN: 19 mg/dL (ref 6–23)
CALCIUM: 9.1 mg/dL (ref 8.4–10.5)
CHLORIDE: 103 meq/L (ref 96–112)
CO2: 29 meq/L (ref 19–32)
Creatinine, Ser: 1.18 mg/dL — ABNORMAL HIGH (ref 0.50–1.10)
GFR calc Af Amer: 55 mL/min — ABNORMAL LOW (ref >90)
GFR calc non Af Amer: 47 mL/min — ABNORMAL LOW (ref >90)
Glucose, Bld: 96 mg/dL (ref 70–99)
Potassium: 4.6 mEq/L (ref 3.7–5.3)
SODIUM: 141 meq/L (ref 137–147)

## 2013-11-01 LAB — T-HELPER CELLS (CD4) COUNT (NOT AT ARMC)
CD4 % Helper T Cell: 37 % (ref 33–55)
CD4 T Cell Abs: 910 /uL (ref 400–2700)

## 2013-11-01 MED ORDER — BUDESONIDE 0.25 MG/2ML IN SUSP
0.2500 mg | Freq: Two times a day (BID) | RESPIRATORY_TRACT | Status: DC
  Administered 2013-11-01 – 2013-11-03 (×4): 0.25 mg via RESPIRATORY_TRACT
  Filled 2013-11-01 (×7): qty 2

## 2013-11-01 MED ORDER — VITAMINS A & D EX OINT
TOPICAL_OINTMENT | CUTANEOUS | Status: AC
  Administered 2013-11-01: 10:00:00
  Filled 2013-11-01: qty 5

## 2013-11-01 NOTE — Care Management Note (Unsigned)
    Page 1 of 1   11/06/2013     2:06:56 PM CARE MANAGEMENT NOTE 11/06/2013  Patient:  [name], [name]   Account Number:  [id number]  Date Initiated:  11/01/2013  Documentation initiated by:  Dr [facility]  Subjective/Objective Assessment:   adm: increasing SOB and chest congestion     Action/Plan:   discharge planning   Anticipated DC Date:  11/03/2013   Anticipated DC Plan:  [place]  CM consult      Choice offered to / List presented to:     DME arranged  [facility]      DME agency  [place].        Status of service:  In process, will continue to follow Medicare Important Message given?  YES (If response is "NO", the following Medicare IM given date fields will be blank) Date Medicare IM given:  11/01/2013 Date Additional Medicare IM given:    Discharge Disposition:    Per UR Regulation:    If discussed at Long Length of Stay Meetings, dates discussed:    Comments:  11/01/13 CM met with pt and gave pt IM from Medicare.  Pt verbalized understanding this is her Right to Appeal if needed and her signature was confirmation she was given the IM.  Signed copy placed in the [place] chart and copy given to pt.  Pt used [facility] for her oxygen needs in the past but was concerned about a possible outstanding balance and possibly being dropped by [facility] for her O2 needs.  CM called and spoke with [facility] rep, Lucretia who states pt has no outstanding balance and as long as O2 parameters are met, [facility] would be happy to be provider of O2.  CM made RN aware. CM will continue to monitor for disposition needs.  [name], BSN, CM [phone].

## 2013-11-01 NOTE — Progress Notes (Signed)
Nutrition Brief Note  Patient identified on the Malnutrition Screening Tool (MST) Report  Wt Readings from Last 15 Encounters:  10/30/13 226 lb 10.1 oz (102.8 kg)  11/19/12 222 lb (100.699 kg)  07/18/12 222 lb (100.699 kg)  05/05/12 238 lb 12 oz (108.296 kg)  12/10/11 224 lb 12 oz (101.946 kg)  03/03/11 215 lb (97.523 kg)  12/11/10 228 lb (103.42 kg)  11/27/10 230 lb (104.327 kg)  09/26/10 225 lb (102.059 kg)  07/03/10 222 lb 6.4 oz (100.88 kg)    Body mass index is 33.45 kg/(m^2). Patient meets criteria for Obesity I based on current BMI.   Current diet order is Regular, patient is consuming approximately 100% of meals at this time. Labs and medications reviewed.   Patient denied unintentional wt loss or change in appetite. Will occasionally skip meals d/t coughing episode/fatigue, but is able to maintain overall nutritional status. Pt noted some financial constraints concerning purchasing foods, was looking into Meals on Wheels program for assistance. Recommended pt discuss with case management for program qualification and/or additional resources available for pt  No nutrition interventions warranted at this time. If nutrition issues arise, please consult RD.   Atlee Abide MS RD LDN Clinical Dietitian [phone]

## 2013-11-01 NOTE — Progress Notes (Signed)
Patient placed on 50% venti mask, due to oxygen saturations staying 85 to 87 percent.  Patient 90% on current setting.  Patient reports being able to breath better due to stuffed nose.  Durwin Nora RN

## 2013-11-01 NOTE — Progress Notes (Signed)
PROGRESS NOTE   [name] FBP:[id number] DOB: 1945/09/22 DOA: 10/30/2013 PCP: [name], MD  Assessment/Plan: Acute respiratory failure - secondary to COPD exacerbation - supplemental oxygen COPD exacerbation - Continue IV steroids, nebs - Pulmonary hygiene - change levofloxacin to po HIV -Continue Atripla -CD4 count normal at 910, RNA pending -follow Dr. 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 Mild chronic linear/patchy opacity in the left upper lobe and lingula, similar to 2013, likely scarring.  No focal consolidation.  No pleural effusion or pneumothorax.  The heart is normal in size.  Mild degenerative changes of the visualized thoracolumbar spine.  IMPRESSION: No evidence of acute cardiopulmonary disease.  Chronic bronchitic changes with left upper lobe/lingular scarring.   Electronically Signed   By: Julian Hy M.D.   On: 10/30/2013 15:58   Subjective: - breathing stable  Objective: Filed Vitals:   10/31/13 1924 10/31/13 2058 11/01/13 0556 11/01/13 0738  BP:  104/66 143/82   Pulse:  85 82   Temp:  98.4 F (36.9 C) 98 F (36.7 C)   TempSrc:  Oral Oral   Resp:  22 20   Height:      Weight:      SpO2: 92% 92% 93% 92%    Intake/Output Summary (Last 24 hours) at 11/01/13 1133 Last data filed at 11/01/13 0900  Gross per 24 hour  Intake    960 ml  Output   1100 ml  Net   -140 ml   Weight change:    Exam:  General:  Pt is alert, follows commands appropriately, not in acute distress  HEENT: No icterus, No thrush, [place]/AT  Cardiovascular: RRR, S1/S2, no rubs, no gallops  Respiratory: Bibasilar wheezes. Good air movement.  Abdomen: Soft/+BS, non tender, non distended, no guarding  Extremities: No edema, No lymphangitis, No petechiae, No rashes, no synovitis  Data Reviewed: Basic Metabolic Panel:  Recent Labs Lab 10/30/13 1505 11/01/13 0450  NA 141 141  K 4.2 4.6  CL 101 103  CO2 29 29  GLUCOSE 111* 96  BUN 13 19  CREATININE 1.17* 1.18*  CALCIUM 8.9 9.1   CBC:  Recent Labs Lab 10/30/13 1505  WBC 10.4  HGB 15.1*  HCT 47.8*  MCV 91.2  PLT 244   Scheduled Meds: . aspirin EC  81 mg Oral Daily  . budesonide (PULMICORT) nebulizer solution  0.25 mg Nebulization BID  . efavirenz-emtricitabine-tenofovir  1 tablet Oral QHS  . enoxaparin (LOVENOX) injection  40 mg Subcutaneous Q24H  . gabapentin  300 mg Oral TID  . guaiFENesin  1,200 mg Oral BID  . ipratropium-albuterol  3 mL Nebulization TID  . levofloxacin  750 mg Oral Daily  . loratadine  10 mg Oral Daily  . methylPREDNISolone (SOLU-MEDROL) injection  60 mg Intravenous Q12H  . montelukast  10 mg Oral QHS  . nicotine  21 mg Transdermal Daily  . sodium chloride  3 mL Intravenous Q12H  . venlafaxine XR  150 mg Oral Daily  . vitamin B-12  250 mcg  Oral Daily   Continuous Infusions:   Time spent 49  Dwain Sarna  [facility] Pager [phone]  If 7PM-7AM, please contact night-coverage [email or web address] Password [email or web address] 11/01/2013, 11:33 AM   LOS: 2 days

## 2013-11-02 LAB — HIV-1 RNA QUANT-NO REFLEX-BLD
HIV 1 RNA Quant: 27 copies/mL — ABNORMAL HIGH (ref <20)
HIV-1 RNA QUANT, LOG: 1.43 {Log} — AB (ref <1.30)

## 2013-11-02 MED ORDER — PREDNISONE 50 MG PO TABS
50.0000 mg | ORAL_TABLET | Freq: Every day | ORAL | Status: DC
  Administered 2013-11-02 – 2013-11-04 (×3): 50 mg via ORAL
  Filled 2013-11-02 (×5): qty 1

## 2013-11-02 NOTE — Progress Notes (Signed)
PROGRESS NOTE   [name] AST:[id number] DOB: February 24, 1946 DOA: 10/30/2013 PCP: [name], MD  Assessment/Plan: Acute respiratory failure - secondary to COPD exacerbation - supplemental oxygen - very slow to improve, feels better today though. COPD exacerbation - Continue steroids, nebs, change steroids to oral - Pulmonary hygiene -  Continue levofloxacin HIV - Continue Atripla - CD4 count normal at 910, RNA pending - follow Dr. 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 Mild chronic linear/patchy opacity in the left upper lobe and lingula, similar to 2013, likely scarring.  No focal consolidation.  No pleural effusion or pneumothorax.  The heart is normal in size.  Mild degenerative changes of the visualized thoracolumbar spine.  IMPRESSION: No evidence of acute cardiopulmonary disease.  Chronic bronchitic changes with left upper lobe/lingular scarring.   Electronically Signed   By: Julian Hy M.D.   On: 10/30/2013 15:58   Subjective: - breathing subjectively improved  Objective: Filed Vitals:   11/01/13 2003 11/01/13 2127 11/02/13 0537 11/02/13 0903  BP:  122/71 130/75   Pulse:  89 80   Temp:  98.1 F (36.7 C) 98.2 F (36.8 C)   TempSrc:  Oral Oral   Resp:  20 20   Height:      Weight:      SpO2: 90% 97% 93% 91%    Intake/Output Summary (Last 24 hours) at 11/02/13 1057 Last data filed at 11/02/13 0943  Gross per  24 hour  Intake    720 ml  Output      0 ml  Net    720 ml   Weight change:   Exam:  General:  Pt is alert, follows commands appropriately, not in acute distress  HEENT: No icterus, No thrush, [place]/AT  Cardiovascular: RRR, S1/S2, no rubs, no gallops  Respiratory: distant breath sounds, no wheezing. Good air movement.  Abdomen: Soft/+BS, non tender, non distended, no guarding  Extremities: No edema, No lymphangitis, No petechiae, No rashes, no synovitis  Data Reviewed: Basic Metabolic Panel:  Recent Labs Lab 10/30/13 1505 11/01/13 0450  NA 141 141  K 4.2 4.6  CL 101 103  CO2 29 29  GLUCOSE 111* 96  BUN 13 19  CREATININE 1.17* 1.18*  CALCIUM 8.9 9.1   CBC:  Recent Labs Lab 10/30/13 1505  WBC 10.4  HGB 15.1*  HCT 47.8*  MCV 91.2  PLT 244   Scheduled Meds: . aspirin EC  81 mg Oral Daily  . budesonide (PULMICORT) nebulizer solution  0.25 mg Nebulization BID  . efavirenz-emtricitabine-tenofovir  1 tablet Oral QHS  . enoxaparin (LOVENOX) injection  40 mg Subcutaneous Q24H  . gabapentin  300 mg Oral TID  . guaiFENesin  1,200 mg Oral BID  . ipratropium-albuterol  3 mL Nebulization TID  . levofloxacin  750 mg Oral Daily  . loratadine  10 mg Oral Daily  . montelukast  10 mg Oral QHS  . nicotine  21 mg Transdermal Daily  . predniSONE  50 mg Oral Q breakfast  .  sodium chloride  3 mL Intravenous Q12H  . venlafaxine XR  150 mg Oral Daily  . vitamin B-12  250 mcg Oral Daily   Continuous Infusions:   Time spent 15  Dwain Sarna  [facility] Pager [phone]  If 7PM-7AM, please contact night-coverage [email or web address] Password [email or web address] 11/02/2013, 10:57 AM   LOS: 3 days

## 2013-11-03 ENCOUNTER — Inpatient Hospital Stay (HOSPITAL_COMMUNITY): Payer: Medicare Other

## 2013-11-03 ENCOUNTER — Ambulatory Visit (HOSPITAL_COMMUNITY): Payer: Medicare Other

## 2013-11-03 ENCOUNTER — Encounter (HOSPITAL_COMMUNITY): Payer: Self-pay | Admitting: Radiology

## 2013-11-03 DIAGNOSIS — J449 Chronic obstructive pulmonary disease, unspecified: Secondary | ICD-10-CM

## 2013-11-03 DIAGNOSIS — J301 Allergic rhinitis due to pollen: Secondary | ICD-10-CM

## 2013-11-03 MED ORDER — MOMETASONE FURO-FORMOTEROL FUM 200-5 MCG/ACT IN AERO
2.0000 | INHALATION_SPRAY | Freq: Two times a day (BID) | RESPIRATORY_TRACT | Status: DC
  Administered 2013-11-03 – 2013-11-04 (×3): 2 via RESPIRATORY_TRACT
  Filled 2013-11-03: qty 8.8

## 2013-11-03 MED ORDER — IOHEXOL 350 MG/ML SOLN
100.0000 mL | Freq: Once | INTRAVENOUS | Status: AC | PRN
  Administered 2013-11-03: 100 mL via INTRAVENOUS

## 2013-11-03 NOTE — Consult Note (Signed)
Name: [name] MRN: [id number] DOB: 1945-11-01    ADMISSION DATE:  10/30/2013 CONSULTATION DATE:  13-Nov-2013  PCP:  Formerly Dr. Herby Abraham MD :  Dr. Cruzita Lederer PRIMARY SERVICE:  TR[facility]  CHIEF COMPLAINT:  SOB  BRIEF PATIENT DESCRIPTION: 68 y/o F, with PMH of 042 positive, O2 2L dependent COPD admitted 5/4 with increasing SOB & chest congestion thought to be and AECOPD.  Patient was slow to resolve with ongoing hypoxia & PCCM consulted for evaluation.    SIGNIFICANT EVENTS / STUDIES:  5/4 - admit with cough 2022/11/14 - CXR without acute infiltrate / edema, remains on 6L O2   CULTURES: 5/6 - CD4 % 37, Absolute CD4 990, HIV RNA 27  ANTIBIOTICS: Levaquin 5/4 >>  VIRAL REGIMEN Atripla  HISTORY OF PRESENT ILLNESS:  68 y/o F, current smoker (40 years x 1ppd), with PMH of former IV drug / marijuana use, ? Ongoing ETOH abuse, 042 positive, 2L O2 dependent COPD admitted 5/4 with increasing SOB & chest congestion thought to be and AECOPD.  Patient also reports she has had approximately one week of cough with sputum production.  Initially sputum production was clear but progressed to thick / grey sputum.  Her predominant symptom has been cough. She reports she has coughed to the point of chest, back and side pain.  Pain is what prompted her to come to the ER.  She does not follow up consistently with her PCP or Dr. Megan Salon.   She has been on 2L O2 at night + PRN for the last 7-8 years.  At baseline, she can walk approximately 2 blocks before she has to stop and rest.  She currently needs assistance with ADL's due to fatigue.  She denies fevers, chills, n/v/d, chest pain with inspiration, hemoptysis, melena, weight loss, night sweats. Patient has had ongoing increased oxygen needs and PCCM consulted for evaluation.   Of note, she was seen in the ER in 06/2013 for similar issues and was noted to be 90% on O2.  She reported at that time that she normally runs around 88%.       PAST MEDICAL HISTORY  :  Past Medical History  Diagnosis Date  . COPD (chronic obstructive pulmonary disease)   . Hyperlipidemia   . HIV (human immunodeficiency virus infection)   . Asthma    Past Surgical History  Procedure Laterality Date  . Abcess drainage      abcess under Left arm, abcess from L breast  . Thyroid gland removed    . Removal of abnormal cells on uterus     Prior to Admission medications   Medication Sig Start Date End Date Taking? Authorizing Provider  albuterol (PROVENTIL HFA;VENTOLIN HFA) 108 (90 BASE) MCG/ACT inhaler Inhale into the lungs 2 (two) times daily.   Yes Historical Provider, MD  albuterol (PROVENTIL) (2.5 MG/3ML) 0.083% nebulizer solution Take 3 mLs (2.5 mg total) by nebulization every 4 (four) hours as needed for wheezing or shortness of breath. 10/04/12  Yes Michel Bickers, MD  aspirin EC 81 MG tablet Take 81 mg by mouth daily.   Yes Historical Provider, MD  efavirenz-emtricitabine-tenofovir (ATRIPLA) 600-200-300 MG per tablet Take 1 tablet by mouth at bedtime.   Yes Historical Provider, MD  Fluticasone-Salmeterol (ADVAIR) 500-50 MCG/DOSE AEPB Inhale 1 puff into the lungs every 12 (twelve) hours.   Yes Historical Provider, MD  gabapentin (NEURONTIN) 300 MG capsule Take 300 mg by mouth 3 (three) times daily.   Yes Historical Provider, MD  loratadine (CLARITIN) 10 MG tablet Take 10 mg by mouth daily.   Yes Historical Provider, MD  montelukast (SINGULAIR) 10 MG tablet Take 1 tablet (10 mg total) by mouth at bedtime. 12/02/12  Yes Michel Bickers, MD  oxyCODONE-acetaminophen (PERCOCET/ROXICET) 5-325 MG per tablet Take 1 tablet by mouth every 4 (four) hours as needed for pain.   Yes Historical Provider, MD  venlafaxine XR (EFFEXOR-XR) 150 MG 24 hr capsule Take 150 mg by mouth daily.   Yes Historical Provider, MD  vitamin B-12 (CYANOCOBALAMIN) 250 MCG tablet Take 250 mcg by mouth daily.   Yes Historical Provider, MD   No Known Allergies  FAMILY HISTORY:  Family History  Problem  Relation Age of Onset  . Pneumonia Mother   . Cancer Sister   . Hypertension Sister   . Diabetes Sister    SOCIAL HISTORY:  reports that she has been smoking Cigarettes.  She has a 40 pack-year smoking history. She has never used smokeless tobacco. She reports that she does not drink alcohol or use illicit drugs.  REVIEW OF SYSTEMS:   Constitutional: Negative for fever, chills, weight loss, malaise/fatigue and diaphoresis.  HENT: Negative for hearing loss, ear pain, nosebleeds, congestion, sore throat, neck pain, tinnitus and ear discharge.   Eyes: Negative for blurred vision, double vision, photophobia, pain, discharge and redness.  Respiratory: Negative for hemoptysis,  wheezing and stridor.  Reports pleuritic chest pain, cough with sputum production, increased shortness of breath.   Cardiovascular: Negative for chest pain, palpitations, orthopnea, claudication, leg swelling and PND.  Gastrointestinal: Negative for heartburn, nausea, vomiting, abdominal pain, diarrhea, constipation, blood in stool and melena.  Genitourinary: Negative for dysuria, urgency, frequency, hematuria and flank pain.  Musculoskeletal: Negative for myalgias, back pain, joint pain and falls.  Skin: Negative for itching and rash.  Neurological: Negative for dizziness, tingling, tremors, sensory change, speech change, focal weakness, seizures, loss of consciousness, weakness and headaches.  Endo/Heme/Allergies: Negative for environmental allergies and polydipsia. Does not bruise/bleed easily.  SUBJECTIVE:   VITAL SIGNS: Temp:  [97.5 F (36.4 C)-98.4 F (36.9 C)] 97.5 F (36.4 C) (05/08 0604) Pulse Rate:  [80-86] 80 (05/08 0604) Resp:  [18-20] 18 (05/08 0604) BP: (110-124)/(67-74) 121/67 mmHg (05/08 0604) SpO2:  [86 %-99 %] 86 % (05/08 1150)  PHYSICAL EXAMINATION: General:  wdwn adult female in NAD  Neuro:  AAOx4, speech clear, MAE HEENT:  Mm pink/moist, no jvd Cardiovascular:  s1s2 rrr, no m/r/g Lungs:   resp's even/non-labored, lungs bilaterally coarse, few basilar crackles Abdomen:  Round/soft, bsx4 active  Musculoskeletal:  No acute deformities  Skin:  No    Recent Labs Lab 10/30/13 1505 11/01/13 0450  NA 141 141  K 4.2 4.6  CL 101 103  CO2 29 29  BUN 13 19  CREATININE 1.17* 1.18*  GLUCOSE 111* 96    Recent Labs Lab 10/30/13 1505  HGB 15.1*  HCT 47.8*  WBC 10.4  PLT 244   No results found.  ASSESSMENT / PLAN:  Acute Hypoxic Respiratory Failure - concern for chronic hypoxemia in setting of possible PAH with HIV.  She likely has needed higher O2 at baseline but must rule out other pathology COPD Exacerbation  Tobacco Abuse  R/O PAH, PE, shunt  Her chronic O2 needs are not explained well Currently no active bronchospasm as well and normal PCXR  Plan: -assess CTA to r/o PE -assess ECHO for ASD, PAH -titrate oxygen for sats > 90% -smoking cessation -medication adherence -unimpressed for immunocompromised host concerns /  infectious  Will follow in am    Noe Gens, NP-C [facility] Pgr: [phone] or [phone]  11/03/2013, 12:31 PM  I have fully examined this patient and agree with above findings.    And edited in full  Lavon Paganini. Titus Mould, MD, [place] Pgr: [facility]

## 2013-11-03 NOTE — Progress Notes (Signed)
SATURATION QUALIFICATIONS: (This note is used to comply with regulatory documentation for home oxygen)  Patient Saturations on Room Air at Rest = %  Patient Saturations on Room Air while Ambulating = %  Patient Saturations on 6 Liters of oxygen while Ambulating = 85 to 86%  Please briefly explain why patient needs home oxygen: COPD

## 2013-11-03 NOTE — Progress Notes (Signed)
PROGRESS NOTE   [name] NOM:[id number] DOB: [birth year] DOA: 10/30/2013 PCP: [name], MD  Assessment/Plan: Acute respiratory failure - secondary to COPD exacerbation - supplemental oxygen - very slow to improve, consulted Pulm today since she is barely maintaining 88% on 6L, however she is asymptomatic. COPD exacerbation - Continue steroids, nebs, Abx - Pulmonary hygiene HIV - Continue Atripla - CD4 count normal at 910, RNA low - follow Dr. Megan Salon, but has missed appts. Has a new appointment 5/11 now. Tobacco abuse - Tobacco cessation discussed. Determined not to smoke again.  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 Mild chronic linear/patchy opacity in the left upper lobe and lingula, similar to 2013, likely scarring.  No focal consolidation.  No pleural effusion or pneumothorax.  The heart is normal in size.  Mild degenerative changes of the visualized thoracolumbar spine.  IMPRESSION: No evidence of acute cardiopulmonary disease.  Chronic bronchitic changes with left upper lobe/lingular scarring.   Electronically Signed   By: Julian Hy M.D.   On: 10/30/2013 15:58   Subjective: - breathing subjectively improved  Objective: Filed Vitals:   11/02/13 2100 11/03/13 0604 11/03/13 0747 11/03/13 1150  BP: 124/70 121/67    Pulse: 83 80    Temp: 97.9 F (36.6 C) 97.5 F (36.4 C)    TempSrc: Oral Oral    Resp: 20 18    Height:      Weight:      SpO2: 99% 93% 91% 86%    Intake/Output Summary  (Last 24 hours) at 11/03/13 1236 Last data filed at 11/03/13 0804  Gross per 24 hour  Intake    840 ml  Output      0 ml  Net    840 ml   Weight change:   Exam:  General:  Pt is alert, follows commands appropriately, not in acute distress  HEENT: No icterus, No thrush, [place]/AT  Cardiovascular: RRR, S1/S2, no rubs, no gallops  Respiratory: distant breath sounds, no wheezing. Good air movement.  Abdomen: Soft/+BS, non tender, non distended, no guarding  Extremities: No edema, No lymphangitis, No petechiae, No rashes, no synovitis  Data Reviewed: Basic Metabolic Panel:  Recent Labs Lab 10/30/13 1505 11/01/13 0450  NA 141 141  K 4.2 4.6  CL 101 103  CO2 29 29  GLUCOSE 111* 96  BUN 13 19  CREATININE 1.17* 1.18*  CALCIUM 8.9 9.1   CBC:  Recent Labs Lab 10/30/13 1505  WBC 10.4  HGB 15.1*  HCT 47.8*  MCV 91.2  PLT 244   Scheduled Meds: . aspirin EC  81 mg Oral Daily  . efavirenz-emtricitabine-tenofovir  1 tablet Oral QHS  . enoxaparin (LOVENOX) injection  40 mg Subcutaneous Q24H  . gabapentin  300 mg Oral TID  . guaiFENesin  1,200 mg Oral BID  . ipratropium-albuterol  3 mL Nebulization TID  . levofloxacin  750 mg Oral Daily  . loratadine  10 mg Oral Daily  . mometasone-formoterol  2 puff Inhalation BID  . montelukast  10 mg Oral QHS  . nicotine  21 mg  Transdermal Daily  . predniSONE  50 mg Oral Q breakfast  . sodium chloride  3 mL Intravenous Q12H  . venlafaxine XR  150 mg Oral Daily  . vitamin B-12  250 mcg Oral Daily   Continuous Infusions:   Time spent 65  Dwain Sarna  [facility] Pager [phone]  If 7PM-7AM, please contact night-coverage [email or web address] Password [email or web address] 11/03/2013, 12:36 PM   LOS: 4 days

## 2013-11-04 DIAGNOSIS — J9600 Acute respiratory failure, unspecified whether with hypoxia or hypercapnia: Secondary | ICD-10-CM

## 2013-11-04 DIAGNOSIS — R0902 Hypoxemia: Secondary | ICD-10-CM

## 2013-11-04 DIAGNOSIS — R911 Solitary pulmonary nodule: Secondary | ICD-10-CM

## 2013-11-04 LAB — BASIC METABOLIC PANEL
BUN: 23 mg/dL (ref 6–23)
CHLORIDE: 100 meq/L (ref 96–112)
CO2: 30 meq/L (ref 19–32)
Calcium: 9.8 mg/dL (ref 8.4–10.5)
Creatinine, Ser: 1.14 mg/dL — ABNORMAL HIGH (ref 0.50–1.10)
GFR calc Af Amer: 57 mL/min — ABNORMAL LOW (ref >90)
GFR calc non Af Amer: 49 mL/min — ABNORMAL LOW (ref >90)
Glucose, Bld: 116 mg/dL — ABNORMAL HIGH (ref 70–99)
POTASSIUM: 4.1 meq/L (ref 3.7–5.3)
SODIUM: 141 meq/L (ref 137–147)

## 2013-11-04 LAB — CBC
HEMATOCRIT: 48.2 % — AB (ref 36.0–46.0)
Hemoglobin: 14.7 g/dL (ref 12.0–15.0)
MCH: 28.3 pg (ref 26.0–34.0)
MCHC: 30.5 g/dL (ref 30.0–36.0)
MCV: 92.9 fL (ref 78.0–100.0)
Platelets: 236 10*3/uL (ref 150–400)
RBC: 5.19 MIL/uL — AB (ref 3.87–5.11)
RDW: 15.4 % (ref 11.5–15.5)
WBC: 10.9 10*3/uL — ABNORMAL HIGH (ref 4.0–10.5)

## 2013-11-04 MED ORDER — IPRATROPIUM-ALBUTEROL 0.5-2.5 (3) MG/3ML IN SOLN
3.0000 mL | Freq: Four times a day (QID) | RESPIRATORY_TRACT | Status: DC
  Administered 2013-11-04 – 2013-11-06 (×7): 3 mL via RESPIRATORY_TRACT
  Filled 2013-11-04 (×9): qty 3

## 2013-11-04 MED ORDER — PREDNISONE 20 MG PO TABS
30.0000 mg | ORAL_TABLET | Freq: Every day | ORAL | Status: DC
  Administered 2013-11-05 – 2013-11-06 (×2): 30 mg via ORAL
  Filled 2013-11-04 (×3): qty 1

## 2013-11-04 NOTE — Progress Notes (Signed)
Echocardiogram 2D Echocardiogram has been performed.  Doyle Askew 11/04/2013, 8:32 AM

## 2013-11-04 NOTE — Progress Notes (Signed)
Subjective: Pt feels better on venti mask approx 30%.  No increased wob.  Very little cough or congestion.   Objective: Vital signs in last 24 hours: Blood pressure 126/74, pulse 78, temperature 97.5 F (36.4 C), temperature source Oral, resp. rate 18, height 5\' 9"  (1.753 m), weight 102.8 kg (226 lb 10.1 oz), SpO2 97.00%.  Intake/Output from previous day: 05/08 0701 - 05/09 0700 In: 240 [P.O.:240] Out: -    Physical Exam:   ow female in nad Nose without purulence or d/c noted. Neck without LN or TMG Chest with mildly decreased bs, a few basilar crackles. Cor with rrr LE with edema noted, no cyanosis Alert and oriented, moves all 4    Lab Results:  Recent Labs  11/04/13 0558  WBC 10.9*  HGB 14.7  HCT 48.2*  PLT 236   BMET  Recent Labs  11/04/13 0558  NA 141  K 4.1  CL 100  CO2 30  GLUCOSE 116*  BUN 23  CREATININE 1.14*  CALCIUM 9.8    Studies/Results: Dg Chest 2 View  11/03/2013   CLINICAL DATA:  Worsening  rales, productive cough  EXAM: CHEST  2 VIEW  COMPARISON:  10/30/2013  FINDINGS: Cardiomediastinal silhouette is stable. No acute infiltrate or pleural effusion. No pulmonary edema. Central mild bronchitic changes. Stable scarring lingula and left base.  IMPRESSION: No acute infiltrate or pulmonary edema. Central mild bronchitic changes.   Electronically Signed   By: Lahoma Crocker M.D.   On: 11/03/2013 12:36   Ct Angio Chest Pe W/cm &/or Wo Cm  11/03/2013   CLINICAL DATA:  Shortness of breath, chest pain, history of COPD  EXAM: CT ANGIOGRAPHY CHEST WITH CONTRAST  TECHNIQUE: Multidetector CT imaging of the chest was performed using the standard protocol during bolus administration of intravenous contrast. Multiplanar CT image reconstructions and MIPs were obtained to evaluate the vascular anatomy.  CONTRAST:  155mL OMNIPAQUE IOHEXOL 350 MG/ML SOLN  COMPARISON:  None.  FINDINGS: This study is mildly to moderately limited for combination of factors including  suboptimal opacification of the pulmonary arterial system (despite repeating the study) hand respiratory motion artifact. Allowing for these limitations, there are no pulmonary arterial emboli identified.  There is calcification of the aorta and there is moderate plaque within the lumen of the descending thoracic aorta focally in its midsection. There is no hilar or mediastinal adenopathy. There are no pleural or pericardial effusions.  Scans through the upper abdomen are unremarkable. There are no acute musculoskeletal findings.  There is mild perihilar peribronchial wall thickening. There is mild interstitial change at both lung bases with mild atelectatic change at both lung bases is well. There is no consolidation or effusion. In the inferior lingula there is a spiculated 2.3 cm mass.  Review of the MIP images confirms the above findings.  IMPRESSION: 1. No evidence of pulmonary embolism although the study is limited 2. Mild chronic bronchitic change 3. Mass in the inferior lingula highly suspicious for bronchogenic carcinoma. These results will be called to the ordering clinician or representative by the Radiologist Assistant, and communication documented in the PACS Dashboard.   Electronically Signed   By: Skipper Cliche M.D.   On: 11/03/2013 16:46    Assessment/Plan:  1) acute respiratory failure presumed secondary to COPD exacerbation.  It is unclear to me if the pt even has COPD, with no PFT's documented.  She could have acute asthmatic bronchitis related to her ongoing smoking.  She has no bronchospasm on exam today,  and recent ct chest really shows no acute process except a lingular mass.  Her echo shows normal LV, and although the PA pressures cannot be estimated, there is nothing to indicate significant elevation of PA pressures.  She still could have a shunt, and if considering this, could do TCD bubble study (most sensitive).   -d/c dulera since already getting duonebs and high dose  prednisone -wean oxygen as tolerated. -will see again on Monday to re-evaluate progress.  2) lingular mass that I suspect represents bronchogenic cancer.  Can be evaluated as outpt.   3) HIV +       Kathee Delton, M.D. 11/04/2013, 1:33 PM

## 2013-11-04 NOTE — Progress Notes (Signed)
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 Will set up with oncology.   COPD exacerbation - Continue steroids, nebs, change steroids to oral - Pulmonary hygiene - Continue levofloxacin  HIV - Continue Atripla - CD4 count normal at 910, RNA pending - follow Dr. 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 Mild chronic linear/patchy opacity in the left upper lobe and lingula, similar to 2013, likely scarring.  No focal consolidation.  No pleural effusion or pneumothorax.  The heart is normal in size.  Mild degenerative changes of the visualized thoracolumbar spine.  IMPRESSION: No evidence of acute cardiopulmonary disease.  Chronic bronchitic changes with left upper lobe/lingular scarring.   Electronically Signed   By: Julian Hy M.D.   On: 10/30/2013 15:58   Subjective: - breathing subjectively improved  Objective: Filed Vitals:   11/03/13 2045 11/03/13 2205 11/04/13 0523 11/04/13 0835  BP:  137/82 126/74   Pulse:  88 78   Temp:  98.9 F (37.2 C) 97.5 F (36.4 C)   TempSrc:  Oral Oral   Resp:  18 18   Height:      Weight:      SpO2: 93% 95% 99% 97%   No intake or output data in the 24 hours ending 11/04/13  1032 Weight change:   Exam:  General:  Pt is alert, follows commands appropriately, not in acute distress  HEENT: No icterus, No thrush, [place]/AT  Cardiovascular: RRR, S1/S2, no rubs, no gallops  Respiratory: distant breath sounds, no wheezing. Good air movement.  Abdomen: Soft/+BS, non tender, non distended, no guarding  Extremities: No edema, No lymphangitis, No petechiae, No rashes, no synovitis  Data Reviewed: Basic Metabolic Panel:  Recent Labs Lab 10/30/13 1505 11/01/13 0450 11/04/13 0558  NA 141 141 141  K 4.2 4.6 4.1  CL 101 103 100  CO2 29 29 30   GLUCOSE 111* 96 116*  BUN 13 19 23   CREATININE 1.17* 1.18* 1.14*  CALCIUM 8.9 9.1 9.8   CBC:  Recent Labs Lab 10/30/13 1505 11/04/13 0558  WBC 10.4 10.9*  HGB 15.1* 14.7  HCT 47.8* 48.2*  MCV 91.2 92.9  PLT 244 236   Scheduled Meds: . aspirin EC  81 mg Oral Daily  . efavirenz-emtricitabine-tenofovir  1 tablet Oral QHS  . enoxaparin (LOVENOX) injection  40 mg Subcutaneous Q24H  . gabapentin  300 mg Oral TID  . guaiFENesin  1,200 mg Oral BID  . ipratropium-albuterol  3 mL Nebulization TID  . levofloxacin  750 mg Oral Daily  . loratadine  10 mg Oral Daily  . mometasone-formoterol  2 puff Inhalation BID  . montelukast  10 mg Oral QHS  . nicotine  21 mg Transdermal Daily  . predniSONE  50 mg Oral Q breakfast  . sodium chloride  3 mL Intravenous Q12H  .  venlafaxine XR  150 mg Oral Daily  . vitamin B-12  250 mcg Oral Daily   Continuous Infusions:   Time spent 37  Dwain Sarna  [facility] Pager [phone]  If 7PM-7AM, please contact night-coverage [email or web address] Password [email or web address] 11/04/2013, 10:32 AM   LOS: 5 days

## 2013-11-05 NOTE — Progress Notes (Signed)
PROGRESS NOTE   [name] [id number] DOB: [birth year] DOA: 10/30/2013 PCP: [name], MD  Assessment/Plan: Acute respiratory failure - secondary to COPD exacerbation likely - supplemental oxygen - very slow to improve - monitor for next 24 h. 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 Will set up with oncology on Monday  COPD exacerbation - Continue steroids, nebs, change steroids to oral - Pulmonary hygiene - Continue levofloxacin  HIV - Continue Atripla - CD4 count normal at 910, RNA pending - follow Dr. 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 Mild chronic linear/patchy opacity in the left upper lobe and lingula, similar to 2013, likely scarring.  No focal consolidation.  No pleural effusion or pneumothorax.  The heart is normal in size.  Mild degenerative changes of the visualized thoracolumbar spine.  IMPRESSION: No evidence of acute cardiopulmonary disease.  Chronic bronchitic changes with left upper lobe/lingular scarring.   Electronically Signed   By: Julian Hy M.D.   On: 10/30/2013 15:58   Subjective: - breathing subjectively improved  Objective: Filed Vitals:   11/04/13 1457 11/04/13 2151 11/05/13 0622 11/05/13 0858  BP: 104/70 122/74 122/73   Pulse: 93 83 61   Temp: 98.6 F (37 C) 98.6 F (37 C) 98.6 F (37 C)   TempSrc: Oral Oral Oral   Resp: 18 18 18    Height:      Weight:       SpO2: 95% 94% 100% 96%    Intake/Output Summary (Last 24 hours) at 11/05/13 0950 Last data filed at 11/04/13 1900  Gross per 24 hour  Intake    720 ml  Output      0 ml  Net    720 ml   Weight change:   Exam:  General:  Pt is alert, follows commands appropriately, not in acute distress  HEENT: No icterus, No thrush, [place]/AT  Cardiovascular: RRR, S1/S2, no rubs, no gallops  Respiratory: distant breath sounds, no wheezing. Good air movement.  Abdomen: Soft/+BS, non tender, non distended, no guarding  Extremities: No edema, No lymphangitis, No petechiae, No rashes, no synovitis  Data Reviewed: Basic Metabolic Panel:  Recent Labs Lab 10/30/13 1505 11/01/13 0450 11/04/13 0558  NA 141 141 141  K 4.2 4.6 4.1  CL 101 103 100  CO2 29 29 30   GLUCOSE 111* 96 116*  BUN 13 19 23   CREATININE 1.17* 1.18* 1.14*  CALCIUM 8.9 9.1 9.8   CBC:  Recent Labs Lab 10/30/13 1505 11/04/13 0558  WBC 10.4 10.9*  HGB 15.1* 14.7  HCT 47.8* 48.2*  MCV 91.2 92.9  PLT 244 236   Scheduled Meds: . aspirin EC  81 mg Oral Daily  . efavirenz-emtricitabine-tenofovir  1 tablet Oral QHS  . enoxaparin (LOVENOX) injection  40 mg Subcutaneous Q24H  . gabapentin  300 mg Oral TID  . guaiFENesin  1,200 mg Oral BID  . ipratropium-albuterol  3 mL Nebulization QID  . levofloxacin  750 mg Oral  Daily  . loratadine  10 mg Oral Daily  . montelukast  10 mg Oral QHS  . nicotine  21 mg Transdermal Daily  . predniSONE  30 mg Oral Q breakfast  . sodium chloride  3 mL Intravenous Q12H  . venlafaxine XR  150 mg Oral Daily  . vitamin B-12  250 mcg Oral Daily   Continuous Infusions:   Time spent 44  Dwain Sarna  [facility] Pager [phone]  If 7PM-7AM, please contact night-coverage [email or web address] Password [email or web address] 11/05/2013, 9:50 AM   LOS: 6 days

## 2013-11-06 ENCOUNTER — Telehealth: Payer: Self-pay | Admitting: Internal Medicine

## 2013-11-06 ENCOUNTER — Other Ambulatory Visit: Payer: Medicare Other

## 2013-11-06 LAB — CREATININE, SERUM
CREATININE: 1.12 mg/dL — AB (ref 0.50–1.10)
GFR calc Af Amer: 58 mL/min — ABNORMAL LOW (ref >90)
GFR calc non Af Amer: 50 mL/min — ABNORMAL LOW (ref >90)

## 2013-11-06 MED ORDER — LEVOFLOXACIN 750 MG PO TABS: 750.0000 mg | ORAL_TABLET | Freq: Every day | ORAL | Status: DC

## 2013-11-06 MED ORDER — OXYCODONE-ACETAMINOPHEN 5-325 MG PO TABS: 1.0000 | ORAL_TABLET | ORAL | Status: DC | PRN

## 2013-11-06 MED ORDER — PREDNISONE 10 MG PO TABS: 30.0000 mg | ORAL_TABLET | Freq: Every day | ORAL | Status: DC

## 2013-11-06 MED ORDER — NICOTINE 21 MG/24HR TD PT24: 21.0000 mg | MEDICATED_PATCH | Freq: Every day | TRANSDERMAL | Status: DC

## 2013-11-06 NOTE — Telephone Encounter (Signed)
S/W DR. GHERGHE AND GAVE NP APPT FOR PATIENT ON 05/13 @ 1:30 W/DR. MOHAMED

## 2013-11-06 NOTE — Progress Notes (Signed)
Patient's oxygen level went down to 84% on room air at rest

## 2013-11-06 NOTE — Telephone Encounter (Signed)
C/D 11/06/13 for appt. 11/08/13

## 2013-11-06 NOTE — Consult Note (Signed)
   Name: [name] MRN: [id number] DOB: 13-Feb-1946    ADMISSION DATE:  10/30/2013 CONSULTATION DATE:  11-29-2013  PCP:  Formerly Dr. Herby Abraham MD :  Dr. Cruzita Lederer PRIMARY SERVICE:  TRH  CHIEF COMPLAINT:  SOB  BRIEF PATIENT DESCRIPTION: 68 y/o F, with PMH of 042 positive, O2 2L dependent COPD admitted 5/4 with increasing SOB & chest congestion thought to be and AECOPD.  Patient was slow to resolve with ongoing hypoxia & PCCM consulted for evaluation.    SIGNIFICANT EVENTS / STUDIES:  5/4 - admit with cough 11-30-2022 - CXR without acute infiltrate / edema, remains on 6L O2   CULTURES: 5/6 -  Absolute CD4 990, HIV RNA 27  ANTIBIOTICS: Levaquin 5/4 >>  VIRAL REGIMEN Atripla   SUBJECTIVE: Pt reports SOB after floors were cleaned with cleaning solution   VITAL SIGNS: Temp:  [98.1 F (36.7 C)-98.5 F (36.9 C)] 98.1 F (36.7 C) (05/11 0606) Pulse Rate:  [73-88] 84 (05/11 0606) Resp:  [18] 18 (05/11 0606) BP: (127-142)/(71-80) 142/80 mmHg (05/11 0606) SpO2:  [90 %-98 %] 94 % (05/11 0750)  PHYSICAL EXAMINATION: General:  wdwn adult female in NAD  Neuro:  AAOx4, speech clear, MAE HEENT:  Mm pink/moist, no jvd Cardiovascular:  s1s2 rrr, no m/r/g Lungs:  resp's even/non-labored, lungs bilaterally distant but clear Abdomen:  Round/soft, bsx4 active  Musculoskeletal:  No acute deformities  Skin:  No    Recent Labs Lab 10/30/13 1505 11/01/13 0450 11/04/13 0558 11/06/13 0440  NA 141 141 141  --   K 4.2 4.6 4.1  --   CL 101 103 100  --   CO2 29 29 30   --   BUN 13 19 23   --   CREATININE 1.17* 1.18* 1.14* 1.12*  GLUCOSE 111* 96 116*  --     Recent Labs Lab 10/30/13 1505 11/04/13 0558  HGB 15.1* 14.7  HCT 47.8* 48.2*  WBC 10.4 10.9*  PLT 244 236   No results found.  ASSESSMENT / PLAN:  Acute Hypoxic Respiratory Failure - concern for chronic hypoxemia in setting of possible PAH with HIV.  She likely has needed higher O2 at baseline but must rule out other  pathology COPD Exacerbation  R/O PAH, PE, shunt - CTA neg for PE, no ASD on ECHO, no obvious PAH on ECHO Chronic Hypoxemia   Plan: -titrate oxygen for sats > 90% -smoking cessation -medication adherence -unimpressed for immunocompromised host concerns / infectious -consider 5 days abx for COPD exacerbation coverage, taper pred to off x 1 week since bspasm resolved  Lingular Mass - concerning for bronchogenic carcinoma   Plan: -pl arrange for outpatient PET scan & PFTs  on discharge -if isolated mass, CVTS evaluation for resection -Oncology consult can be deferred until definitive daignosis  HIV Positive   Plan: -per Primary MD  Tobacco Abuse  - Nicotine patch Consider chantix on discharge  PCCM will be available PRN. I have made out pt FU appt    Noe Gens, NP-C [facility] Pgr: [phone] or [phone]  Independently examined pt, evaluated data & formulated above care plan with NP who scribed this note & edited by me.  [place]

## 2013-11-06 NOTE — Discharge Summary (Addendum)
Physician Discharge Summary  [name] [id number]:[id number] DOB: March 30, 1946 DOA: 10/30/2013  PCP: [name], MD  Admit date: 10/30/2013 Discharge date: 11/06/2013  Time spent: 35 minutes  Recommendations for Outpatient Follow-up:  1. Follow up with Oncology and Pulmonology as scheduled   Discharge Diagnoses:  Principal Problem:   COPD with acute exacerbation Active Problems:   HIV DISEASE   Cigarette smoker   COPD exacerbation   Acute respiratory failure  Discharge Condition: stable  Diet recommendation: heart healthy  Filed Weights   10/30/13 2028  Weight: 102.8 kg (226 lb 10.1 oz)   History of present illness:  [name] is a 68 y.o. female, with history of asthma, COPD ,and smoking presenting with one month history of increasing shortness of breath and chest congestion. No history of fever chills but complains of some pleuritic chest pains in the right times . In the emergency room she improved with neb treatments and steroids however the patient is only on home oxygen at night and she was hypoxemic without oxygen and I was called to admit. Patient reports dry cough, no blood in his sputum .  Hospital Course:  Acute on chronic respiratory failure - secondary to COPD exacerbation in the setting of chronic oxygen use and ongoing tobacco abuse. Patient has been on 2L [place] for a long time, and has felt for a while that this is not enough, however did not follow with her PCP about that. She never saw a pulmonologist. She was very slow to improve, thus pulmonology was consulted. Patient underwent a CTPA which was negative for a PE however did unfortunately show an inferior lingular spiculated mass, which is likely to be cancer.  Lung mass, likely cancer - patient has been set up with Dr. [name] in 2 days for follow up for further evaluation.  COPD exacerbation - Continue steroids, antibiotics, will finish as outpatient. She has follow up with Pulmonology in few weeks.  HIV -  Continue Atripla, she reports complicance with her medication which is evident based on her CD4 count of 910. She is regularly seen by Dr. Megan Salon as an outpatient, but has missed several appointments.  Tobacco abuse - Tobacco cessation discussed, patient was given nicotine patches on discharge. She is determined to quit this time.  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  Procedures:  none   Consultations:  Pulmonology   Discharge Exam: Filed Vitals:   11/06/13 0750 11/06/13 1231 11/06/13 1259 11/06/13 1332  BP:    153/72  Pulse:    92  Temp:    98.5 F (36.9 C)  TempSrc:    Oral  Resp:    18  Height:      Weight:      SpO2: 94% 95% 84% 90%   General: NAD Cardiovascular: RRR Respiratory: distant breath sounds, no wheezing  Discharge Instructions   Future Appointments Provider Department Dept Phone   11/08/2013 1:30 PM [facility] [phone]   11/08/2013 1:45 PM [facility] [phone]   11/08/2013 2:15 PM Curt Bears, MD [facility] [phone]   11/27/2013 10:15 AM Rigoberto Noel, MD [facility] [phone]       Medication List         albuterol 108 (90 BASE) MCG/ACT inhaler  Commonly known as:  PROVENTIL HFA;VENTOLIN HFA  Inhale into the lungs 2 (two)  times daily.     albuterol (2.5 MG/3ML) 0.083% nebulizer solution  Commonly known as:  PROVENTIL  Take 3 mLs (2.5 mg total) by nebulization every 4 (four) hours as needed for wheezing or shortness of breath.     aspirin EC 81 MG tablet  Take 81 mg by mouth daily.     efavirenz-emtricitabine-tenofovir 600-200-300 MG per tablet  Commonly known as:  ATRIPLA  Take 1 tablet by mouth at bedtime.     Fluticasone-Salmeterol 500-50 MCG/DOSE Aepb  Commonly known as:  ADVAIR  Inhale 1 puff into the lungs  every 12 (twelve) hours.     gabapentin 300 MG capsule  Commonly known as:  NEURONTIN  Take 300 mg by mouth 3 (three) times daily.     levofloxacin 750 MG tablet  Commonly known as:  LEVAQUIN  Take 1 tablet (750 mg total) by mouth daily.     loratadine 10 MG tablet  Commonly known as:  CLARITIN  Take 10 mg by mouth daily.     montelukast 10 MG tablet  Commonly known as:  SINGULAIR  Take 1 tablet (10 mg total) by mouth at bedtime.     nicotine 21 mg/24hr patch  Commonly known as:  NICODERM CQ - dosed in mg/24 hours  Place 1 patch (21 mg total) onto the skin daily.     oxyCODONE-acetaminophen 5-325 MG per tablet  Commonly known as:  PERCOCET/ROXICET  Take 1 tablet by mouth every 4 (four) hours as needed.     predniSONE 10 MG tablet  Commonly known as:  DELTASONE  Take 3 tablets (30 mg total) by mouth daily with breakfast. 3 tablets daily for 3 days then 2 tablets daily for 3 days the 1 tablet daily for 3 days then stop     venlafaxine XR 150 MG 24 hr capsule  Commonly known as:  EFFEXOR-XR  Take 150 mg by mouth daily.     vitamin B-12 250 MCG tablet  Commonly known as:  CYANOCOBALAMIN  Take 250 mcg by mouth daily.       Follow-up Information   Follow up with Michel Bickers, MD.   Specialty:  Infectious Diseases   Contact information:   [address] [place] [address] [phone]       Follow up with Rigoberto Noel., MD On 11/27/2013. (10-15 am)    Specialty:  Pulmonary Disease   Contact information:   [address] [address] [phone]       Follow up On . (1)      The results of significant diagnostics from this hospitalization (including imaging, microbiology, ancillary and laboratory) are listed below for reference.    Significant Diagnostic Studies: Dg Chest 2 View  11/03/2013   CLINICAL DATA:  Worsening  rales, productive cough  EXAM: CHEST  2 VIEW  COMPARISON:  10/30/2013  FINDINGS: Cardiomediastinal silhouette is  stable. No acute infiltrate or pleural effusion. No pulmonary edema. Central mild bronchitic changes. Stable scarring lingula and left base.  IMPRESSION: No acute infiltrate or pulmonary edema. Central mild bronchitic changes.   Electronically Signed   By: Lahoma Crocker M.D.   On: 11/03/2013 12:36   Dg Chest 2 View (if Patient Has Fever And/or Copd)  10/30/2013   CLINICAL DATA:  Shortness of breath, cough, right-sided chest pain  EXAM: CHEST  2 VIEW  COMPARISON:  07/03/2013  FINDINGS: Chronic interstitial markings/bronchitic changes. Mild chronic linear/patchy opacity in the left upper lobe and lingula, similar  to 2013, likely scarring.  No focal consolidation.  No pleural effusion or pneumothorax.  The heart is normal in size.  Mild degenerative changes of the visualized thoracolumbar spine.  IMPRESSION: No evidence of acute cardiopulmonary disease.  Chronic bronchitic changes with left upper lobe/lingular scarring.   Electronically Signed   By: Julian Hy M.D.   On: 10/30/2013 15:58   Ct Angio Chest Pe W/cm &/or Wo Cm  11/03/2013   CLINICAL DATA:  Shortness of breath, chest pain, history of COPD  EXAM: CT ANGIOGRAPHY CHEST WITH CONTRAST  TECHNIQUE: Multidetector CT imaging of the chest was performed using the standard protocol during bolus administration of intravenous contrast. Multiplanar CT image reconstructions and MIPs were obtained to evaluate the vascular anatomy.  CONTRAST:  163mL OMNIPAQUE IOHEXOL 350 MG/ML SOLN  COMPARISON:  None.  FINDINGS: This study is mildly to moderately limited for combination of factors including suboptimal opacification of the pulmonary arterial system (despite repeating the study) hand respiratory motion artifact. Allowing for these limitations, there are no pulmonary arterial emboli identified.  There is calcification of the aorta and there is moderate plaque within the lumen of the descending thoracic aorta focally in its midsection. There is no hilar or mediastinal  adenopathy. There are no pleural or pericardial effusions.  Scans through the upper abdomen are unremarkable. There are no acute musculoskeletal findings.  There is mild perihilar peribronchial wall thickening. There is mild interstitial change at both lung bases with mild atelectatic change at both lung bases is well. There is no consolidation or effusion. In the inferior lingula there is a spiculated 2.3 cm mass.  Review of the MIP images confirms the above findings.  IMPRESSION: 1. No evidence of pulmonary embolism although the study is limited 2. Mild chronic bronchitic change 3. Mass in the inferior lingula highly suspicious for bronchogenic carcinoma. These results will be called to the ordering clinician or representative by the Radiologist Assistant, and communication documented in the PACS Dashboard.   Electronically Signed   By: Skipper Cliche M.D.   On: 11/03/2013 16:46   Labs: Basic Metabolic Panel:  Recent Labs Lab 11/01/13 0450 11/04/13 0558 11/06/13 0440  NA 141 141  --   K 4.6 4.1  --   CL 103 100  --   CO2 29 30  --   GLUCOSE 96 116*  --   BUN 19 23  --   CREATININE 1.18* 1.14* 1.12*  CALCIUM 9.1 9.8  --    CBC:  Recent Labs Lab 11/04/13 0558  WBC 10.9*  HGB 14.7  HCT 48.2*  MCV 92.9  PLT 236   BNP: BNP (last 3 results)  Recent Labs  10/30/13 1505  PROBNP 118.6    Signed: Janai Brannigan M Terrea Bruster  [facility] 11/06/2013, 3:54 PM

## 2013-11-07 ENCOUNTER — Other Ambulatory Visit: Payer: Self-pay | Admitting: Internal Medicine

## 2013-11-08 ENCOUNTER — Ambulatory Visit (INDEPENDENT_AMBULATORY_CARE_PROVIDER_SITE_OTHER): Payer: Medicare Other | Admitting: Infectious Disease

## 2013-11-08 ENCOUNTER — Encounter: Payer: Self-pay | Admitting: Internal Medicine

## 2013-11-08 ENCOUNTER — Encounter: Payer: Self-pay | Admitting: Infectious Disease

## 2013-11-08 ENCOUNTER — Other Ambulatory Visit: Payer: Medicare Other

## 2013-11-08 ENCOUNTER — Other Ambulatory Visit: Payer: Self-pay | Admitting: Internal Medicine

## 2013-11-08 ENCOUNTER — Ambulatory Visit (HOSPITAL_BASED_OUTPATIENT_CLINIC_OR_DEPARTMENT_OTHER): Payer: Medicaid Other | Admitting: Internal Medicine

## 2013-11-08 ENCOUNTER — Telehealth: Payer: Self-pay | Admitting: Internal Medicine

## 2013-11-08 ENCOUNTER — Ambulatory Visit: Payer: Medicare Other

## 2013-11-08 VITALS — BP 123/81 | HR 89 | Temp 98.0°F | Ht 69.0 in | Wt 237.0 lb

## 2013-11-08 VITALS — BP 130/59 | HR 96 | Temp 98.3°F | Resp 19 | Ht 69.0 in | Wt 236.2 lb

## 2013-11-08 DIAGNOSIS — C3490 Malignant neoplasm of unspecified part of unspecified bronchus or lung: Secondary | ICD-10-CM

## 2013-11-08 DIAGNOSIS — J449 Chronic obstructive pulmonary disease, unspecified: Secondary | ICD-10-CM

## 2013-11-08 DIAGNOSIS — R222 Localized swelling, mass and lump, trunk: Secondary | ICD-10-CM

## 2013-11-08 DIAGNOSIS — F329 Major depressive disorder, single episode, unspecified: Secondary | ICD-10-CM

## 2013-11-08 DIAGNOSIS — F3289 Other specified depressive episodes: Secondary | ICD-10-CM

## 2013-11-08 DIAGNOSIS — R918 Other nonspecific abnormal finding of lung field: Secondary | ICD-10-CM

## 2013-11-08 DIAGNOSIS — B20 Human immunodeficiency virus [HIV] disease: Secondary | ICD-10-CM

## 2013-11-08 DIAGNOSIS — F17200 Nicotine dependence, unspecified, uncomplicated: Secondary | ICD-10-CM

## 2013-11-08 MED ORDER — EFAVIRENZ-EMTRICITAB-TENOFOVIR 600-200-300 MG PO TABS: 1.0000 | ORAL_TABLET | Freq: Every day | ORAL | Status: DC

## 2013-11-08 NOTE — Patient Instructions (Signed)
Smoking Cessation Quitting smoking is important to your health and has many advantages. However, it is not always easy to quit since nicotine is a very addictive drug. Often times, people try 3 times or more before being able to quit. This document explains the best ways for you to prepare to quit smoking. Quitting takes hard work and a lot of effort, but you can do it. ADVANTAGES OF QUITTING SMOKING  You will live longer, feel better, and live better.  Your body will feel the impact of quitting smoking almost immediately.  Within 20 minutes, blood pressure decreases. Your pulse returns to its normal level.  After 8 hours, carbon monoxide levels in the blood return to normal. Your oxygen level increases.  After 24 hours, the chance of having a heart attack starts to decrease. Your breath, hair, and body stop smelling like smoke.  After 48 hours, damaged nerve endings begin to recover. Your sense of taste and smell improve.  After 72 hours, the body is virtually free of nicotine. Your bronchial tubes relax and breathing becomes easier.  After 2 to 12 weeks, lungs can hold more air. Exercise becomes easier and circulation improves.  The risk of having a heart attack, stroke, cancer, or lung disease is greatly reduced.  After 1 year, the risk of coronary heart disease is cut in half.  After 5 years, the risk of stroke falls to the same as a nonsmoker.  After 10 years, the risk of lung cancer is cut in half and the risk of other cancers decreases significantly.  After 15 years, the risk of coronary heart disease drops, usually to the level of a nonsmoker.  If you are pregnant, quitting smoking will improve your chances of having a healthy baby.  The people you live with, especially any children, will be healthier.  You will have extra money to spend on things other than cigarettes. QUESTIONS TO THINK ABOUT BEFORE ATTEMPTING TO QUIT You may want to talk about your answers with your  caregiver.  Why do you want to quit?  If you tried to quit in the past, what helped and what did not?  What will be the most difficult situations for you after you quit? How will you plan to handle them?  Who can help you through the tough times? Your family? Friends? A caregiver?  What pleasures do you get from smoking? What ways can you still get pleasure if you quit? Here are some questions to ask your caregiver:  How can you help me to be successful at quitting?  What medicine do you think would be best for me and how should I take it?  What should I do if I need more help?  What is smoking withdrawal like? How can I get information on withdrawal? GET READY  Set a quit date.  Change your environment by getting rid of all cigarettes, ashtrays, matches, and lighters in your home, car, or work. Do not let people smoke in your home.  Review your past attempts to quit. Think about what worked and what did not. GET SUPPORT AND ENCOURAGEMENT You have a better chance of being successful if you have help. You can get support in many ways.  Tell your family, friends, and co-workers that you are going to quit and need their support. Ask them not to smoke around you.  Get individual, group, or telephone counseling and support. Programs are available at local hospitals and health centers. Call your local health department for   information about programs in your area.  Spiritual beliefs and practices may help some smokers quit.  Download a "quit meter" on your computer to keep track of quit statistics, such as how long you have gone without smoking, cigarettes not smoked, and money saved.  Get a self-help book about quitting smoking and staying off of tobacco. LEARN NEW SKILLS AND BEHAVIORS  Distract yourself from urges to smoke. Talk to someone, go for a walk, or occupy your time with a task.  Change your normal routine. Take a different route to work. Drink tea instead of coffee.  Eat breakfast in a different place.  Reduce your stress. Take a hot bath, exercise, or read a book.  Plan something enjoyable to do every day. Reward yourself for not smoking.  Explore interactive web-based programs that specialize in helping you quit. GET MEDICINE AND USE IT CORRECTLY Medicines can help you stop smoking and decrease the urge to smoke. Combining medicine with the above behavioral methods and support can greatly increase your chances of successfully quitting smoking.  Nicotine replacement therapy helps deliver nicotine to your body without the negative effects and risks of smoking. Nicotine replacement therapy includes nicotine gum, lozenges, inhalers, nasal sprays, and skin patches. Some may be available over-the-counter and others require a prescription.  Antidepressant medicine helps people abstain from smoking, but how this works is unknown. This medicine is available by prescription.  Nicotinic receptor partial agonist medicine simulates the effect of nicotine in your brain. This medicine is available by prescription. Ask your caregiver for advice about which medicines to use and how to use them based on your health history. Your caregiver will tell you what side effects to look out for if you choose to be on a medicine or therapy. Carefully read the information on the package. Do not use any other product containing nicotine while using a nicotine replacement product.  RELAPSE OR DIFFICULT SITUATIONS Most relapses occur within the first 3 months after quitting. Do not be discouraged if you start smoking again. Remember, most people try several times before finally quitting. You may have symptoms of withdrawal because your body is used to nicotine. You may crave cigarettes, be irritable, feel very hungry, cough often, get headaches, or have difficulty concentrating. The withdrawal symptoms are only temporary. They are strongest when you first quit, but they will go away within  10 14 days. To reduce the chances of relapse, try to:  Avoid drinking alcohol. Drinking lowers your chances of successfully quitting.  Reduce the amount of caffeine you consume. Once you quit smoking, the amount of caffeine in your body increases and can give you symptoms, such as a rapid heartbeat, sweating, and anxiety.  Avoid smokers because they can make you want to smoke.  Do not let weight gain distract you. Many smokers will gain weight when they quit, usually less than 10 pounds. Eat a healthy diet and stay active. You can always lose the weight gained after you quit.  Find ways to improve your mood other than smoking. FOR MORE INFORMATION  www.smokefree.gov  Document Released: 06/09/2001 Document Revised: 12/15/2011 Document Reviewed: 09/24/2011 [employer] Patient Information 2014 [employer].  

## 2013-11-08 NOTE — Progress Notes (Signed)
   Subjective:    Patient ID: [name], female    DOB: 21-Jul-1945, 68 y.o.   MRN: [id number]  HPI  40-year-old [place] American lady with HIV that has been perfectly controlled on Atripla and followed by Dr. Megan Salon having not been seen since January 2014. Her most recent viral load shows her to be essentially undetectable with a healthy CD4 count above 900. She has unfortunately been found a CT angiogram to have what appears to be a bronchogenic carcinoma. She is being evaluated by oncology and is going to have repeat CT scan. She may undergo resection by cardiothoracic surgery if the lesion has not grown in size. There is potential for her receiving chemotherapy in the future.  For now she is comfortable taking Atripla but we may need to adjust it if she goes on chemotherapeutic drugs or if we may want to port or other drug is less likely to exacerbate depression.  Review of Systems  Constitutional: Negative for fever, chills, diaphoresis, activity change, appetite change, fatigue and unexpected weight change.  HENT: Negative for congestion and sore throat.   Eyes: Negative for photophobia and visual disturbance.  Respiratory: Positive for cough and shortness of breath. Negative for chest tightness, wheezing and stridor.   Cardiovascular: Negative for chest pain, palpitations and leg swelling.  Gastrointestinal: Negative for nausea, abdominal pain and abdominal distention.  Genitourinary: Negative for flank pain.  Musculoskeletal: Positive for arthralgias. Negative for back pain, gait problem, joint swelling and myalgias.  Skin: Negative for rash and wound.  Neurological: Negative for dizziness, tremors, weakness and light-headedness.  Hematological: Negative for adenopathy. Does not bruise/bleed easily.  Psychiatric/Behavioral: Positive for dysphoric mood. Negative for behavioral problems, confusion, sleep disturbance, decreased concentration and agitation.       Objective:   Physical Exam  Constitutional: She is oriented to person, place, and time. She appears well-developed and well-nourished. No distress.  HENT:  Head: Normocephalic and atraumatic.  Mouth/Throat: Oropharynx is clear and moist. No oropharyngeal exudate.  Eyes: Conjunctivae and EOM are normal. No scleral icterus.  Neck: Normal range of motion. Neck supple.  Cardiovascular: Normal rate and regular rhythm.   Pulmonary/Chest: Effort normal. No respiratory distress. She has no wheezes.  Abdominal: Soft. She exhibits no distension.  Musculoskeletal: She exhibits no edema.  Neurological: She is alert and oriented to person, place, and time. Coordination normal.  Skin: Skin is warm and dry. She is not diaphoretic. No erythema. No pallor.  Psychiatric: Her behavior is normal. Judgment and thought content normal. Her mood appears anxious.          Assessment & Plan:   HIV: perfect control Atripla continue this. Again if she needs adjustment of her antiretrovirals could consider changing her for example to Isentress and Truvada or TIVICAY and Truvada to avoid drug drug interactions if she is on chemotherapy. Could also consider changing therapy if we wish to get her off the last psychoactive drug than EFV. I spent greater than 25 minutes with the patient including greater than 50% of time in face to face counsel of the patient and in coordination of their care.  Bronchogenic carcinoma possibly: to have repeat CT scan in next 2 week,s possibly surgery. I have asked her to put an appointment booked with Dr. Megan Salon in 2 months time so we can check and see what is going on with her possible malignancy.

## 2013-11-08 NOTE — Telephone Encounter (Signed)
Gave pt appt for lab and MD for MAy 2015 °

## 2013-11-08 NOTE — Progress Notes (Signed)
Checked in new pt with no financial concerns. °

## 2013-11-08 NOTE — Progress Notes (Signed)
[facility] [phone]   [phone]  CONSULT NOTE  REFERRING PHYSICIAN: Dr. Caren Griffins   REASON FOR CONSULTATION:  68 years old African American female with questionable lung cancer.  HPI [name] is a 68 y.o. female was past medical history significant for HIV, depression, osteoporosis, dyslipidemia, adrenal insufficiency, COPD and long history of smoking. She presented to the emergency Department at [facility] complaining of a one-month history of chest congestion, shortness of breath as well as dry cough. Chest x-ray on admission 11/03/2013 showed no acute infiltrate or pulmonary edema. This was followed by CT angiogram of the chest on 11/03/2013 and it showed no evidence for pulmonary embolus but there is a spiculated density mass in the inferior lingula highly suspicious for bronchogenic carcinoma. The patient was treated with nebulizer and steroids and had improvement in her condition. She is currently on home oxygen on as-needed basis.  She is feeling better today and she was referred to me for evaluation and recommendation regarding the new finding in her CT scan of the chest. She continues to have pain under the left breast as well as lower back but she has back pain for years. She also has shortness breath with exertion with mild cough productive of greyish sputum with no hemoptysis. She has no significant weight loss or night sweats. She has occasional headache but no visual changes. She denied having any significant nausea or vomiting or change in her bowel movement. She is followed by Dr. Megan Salon for her HIV. Family history significant for a mother and father are alcoholics. Her sister had pancreatic cancer and lung cancer and brother had pancreatic cancer. The patient is single and has no children. She lives by herself and [place]. She has a lot of family members around including her brother who was the main caregiver.    She has a history of smoking one pack per day for around 45 years and unfortunately she continues to smoke 2 cigarettes every day. She also has a history of alcohol abuse in the past but not recently and no history of drug abuse.  HPI  Past Medical History  Diagnosis Date  . COPD (chronic obstructive pulmonary disease)   . Hyperlipidemia   . HIV (human immunodeficiency virus infection)   . Asthma     Past Surgical History  Procedure Laterality Date  . Abcess drainage      abcess under Left arm, abcess from L breast  . Thyroid gland removed    . Removal of abnormal cells on uterus      Family History  Problem Relation Age of Onset  . Pneumonia Mother   . Cancer Sister   . Hypertension Sister   . Diabetes Sister     Social History History  Substance Use Topics  . Smoking status: Current Every Day Smoker -- 1.00 packs/day for 40 years    Types: Cigarettes  . Smokeless tobacco: Never Used  . Alcohol Use: No     Comment: none in 20 years    No Known Allergies  Current Outpatient Prescriptions  Medication Sig Dispense Refill  . albuterol (PROVENTIL HFA;VENTOLIN HFA) 108 (90 BASE) MCG/ACT inhaler Inhale into the lungs 2 (two) times daily.      Marland Kitchen albuterol (PROVENTIL) (2.5 MG/3ML) 0.083% nebulizer solution Take 3 mLs (2.5 mg total) by nebulization every 4 (four) hours as needed for wheezing or shortness of breath.  75 mL  5  . aspirin EC 81  MG tablet Take 81 mg by mouth daily.      . ATRIPLA 600-200-300 MG per tablet TAKE 1 TABLET BY MOUTH EVERY NIGHT AT BEDTIME *APPOINTMENT REQUIRED FOR REFILLS*  30 tablet  2  . Fluticasone-Salmeterol (ADVAIR) 500-50 MCG/DOSE AEPB Inhale 1 puff into the lungs every 12 (twelve) hours.      . gabapentin (NEURONTIN) 300 MG capsule Take 300 mg by mouth 3 (three) times daily.      Marland Kitchen levofloxacin (LEVAQUIN) 750 MG tablet Take 1 tablet (750 mg total) by mouth daily.  5 tablet  0  . loratadine (CLARITIN) 10 MG tablet Take 10 mg by mouth daily.       . meloxicam (MOBIC) 15 MG tablet Take 15 mg by mouth daily.      . montelukast (SINGULAIR) 10 MG tablet Take 1 tablet (10 mg total) by mouth at bedtime.  30 tablet  6  . oxyCODONE-acetaminophen (PERCOCET/ROXICET) 5-325 MG per tablet Take 1 tablet by mouth every 4 (four) hours as needed.  15 tablet  0  . predniSONE (DELTASONE) 10 MG tablet Take 3 tablets (30 mg total) by mouth daily with breakfast. 3 tablets daily for 3 days then 2 tablets daily for 3 days the 1 tablet daily for 3 days then stop  18 tablet  0  . venlafaxine XR (EFFEXOR-XR) 150 MG 24 hr capsule Take 150 mg by mouth daily.      . vitamin B-12 (CYANOCOBALAMIN) 250 MCG tablet Take 250 mcg by mouth daily.      . nicotine (NICODERM CQ - DOSED IN MG/24 HOURS) 21 mg/24hr patch Place 1 patch (21 mg total) onto the skin daily.  28 patch  0   No current facility-administered medications for this visit.    Review of Systems  Constitutional: positive for fatigue Eyes: negative Ears, nose, mouth, throat, and face: negative Respiratory: positive for dyspnea on exertion and pleurisy/chest pain Cardiovascular: negative Gastrointestinal: negative Genitourinary:negative Integument/breast: negative Hematologic/lymphatic: negative Musculoskeletal:positive for back pain Neurological: negative Behavioral/Psych: negative Endocrine: negative Allergic/Immunologic: negative  Physical Exam  JJH:ERDEY, healthy, no distress, well nourished and well developed SKIN: skin color, texture, turgor are normal, no rashes or significant lesions HEAD: Normocephalic, No masses, lesions, tenderness or abnormalities EYES: normal, PERRLA EARS: External ears normal, Canals clear OROPHARYNX:no exudate, no erythema and lips, buccal mucosa, and tongue normal  NECK: supple, no adenopathy, no JVD LYMPH:  no palpable lymphadenopathy, no hepatosplenomegaly BREAST:not examined LUNGS: clear to auscultation , and palpation HEART: regular rate & rhythm, no murmurs  and no gallops ABDOMEN:abdomen soft, non-tender, obese, normal bowel sounds and no masses or organomegaly BACK: Back symmetric, no curvature., No CVA tenderness EXTREMITIES:no edema, no skin discoloration, no clubbing  NEURO: alert & oriented x 3 with fluent speech, no focal motor/sensory deficits  PERFORMANCE STATUS: ECOG 1  LABORATORY DATA: Lab Results  Component Value Date   WBC 10.9* 11/04/2013   HGB 14.7 11/04/2013   HCT 48.2* 11/04/2013   MCV 92.9 11/04/2013   PLT 236 11/04/2013      Chemistry      Component Value Date/Time   NA 141 11/04/2013 0558   K 4.1 11/04/2013 0558   CL 100 11/04/2013 0558   CO2 30 11/04/2013 0558   BUN 23 11/04/2013 0558   CREATININE 1.12* 11/06/2013 0440   CREATININE 1.16* 07/04/2012 1402      Component Value Date/Time   CALCIUM 9.8 11/04/2013 0558   ALKPHOS 74 11/21/2012 0232   AST 12 11/21/2012 0232  ALT 9 11/21/2012 0232   BILITOT 0.1* 11/21/2012 0232       RADIOGRAPHIC STUDIES: Dg Chest 2 View  11/03/2013   CLINICAL DATA:  Worsening  rales, productive cough  EXAM: CHEST  2 VIEW  COMPARISON:  10/30/2013  FINDINGS: Cardiomediastinal silhouette is stable. No acute infiltrate or pleural effusion. No pulmonary edema. Central mild bronchitic changes. Stable scarring lingula and left base.  IMPRESSION: No acute infiltrate or pulmonary edema. Central mild bronchitic changes.   Electronically Signed   By: Lahoma Crocker M.D.   On: 11/03/2013 12:36   Dg Chest 2 View (if Patient Has Fever And/or Copd)  10/30/2013   CLINICAL DATA:  Shortness of breath, cough, right-sided chest pain  EXAM: CHEST  2 VIEW  COMPARISON:  07/03/2013  FINDINGS: Chronic interstitial markings/bronchitic changes. Mild chronic linear/patchy opacity in the left upper lobe and lingula, similar to 2013, likely scarring.  No focal consolidation.  No pleural effusion or pneumothorax.  The heart is normal in size.  Mild degenerative changes of the visualized thoracolumbar spine.  IMPRESSION: No evidence of acute  cardiopulmonary disease.  Chronic bronchitic changes with left upper lobe/lingular scarring.   Electronically Signed   By: Julian Hy M.D.   On: 10/30/2013 15:58   Ct Angio Chest Pe W/cm &/or Wo Cm  11/03/2013   CLINICAL DATA:  Shortness of breath, chest pain, history of COPD  EXAM: CT ANGIOGRAPHY CHEST WITH CONTRAST  TECHNIQUE: Multidetector CT imaging of the chest was performed using the standard protocol during bolus administration of intravenous contrast. Multiplanar CT image reconstructions and MIPs were obtained to evaluate the vascular anatomy.  CONTRAST:  190mL OMNIPAQUE IOHEXOL 350 MG/ML SOLN  COMPARISON:  None.  FINDINGS: This study is mildly to moderately limited for combination of factors including suboptimal opacification of the pulmonary arterial system (despite repeating the study) hand respiratory motion artifact. Allowing for these limitations, there are no pulmonary arterial emboli identified.  There is calcification of the aorta and there is moderate plaque within the lumen of the descending thoracic aorta focally in its midsection. There is no hilar or mediastinal adenopathy. There are no pleural or pericardial effusions.  Scans through the upper abdomen are unremarkable. There are no acute musculoskeletal findings.  There is mild perihilar peribronchial wall thickening. There is mild interstitial change at both lung bases with mild atelectatic change at both lung bases is well. There is no consolidation or effusion. In the inferior lingula there is a spiculated 2.3 cm mass.  Review of the MIP images confirms the above findings.  IMPRESSION: 1. No evidence of pulmonary embolism although the study is limited 2. Mild chronic bronchitic change 3. Mass in the inferior lingula highly suspicious for bronchogenic carcinoma. These results will be called to the ordering clinician or representative by the Radiologist Assistant, and communication documented in the PACS Dashboard.   Electronically  Signed   By: Skipper Cliche M.D.   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  PLAN: I had a lengthy discussion with the patient today about her current scan results and further investigation to confirm her diagnosis as well as treatment options. I will complete the staging workup by ordering a PET scan as well as MRI of the brain to rule out metastatic disease. If the patient has no evidence for metastatic disease, I  will refer her to thoracic surgery for consideration of surgical resection based on her pulmonary function. The patient is scheduled to see Dr.Alva on 11/27/2013 and most likely he would perform pulmonary function test at that time. She would come back for followup visit in 2 weeks for reevaluation and discussion of her scan results and further recommendation regarding her condition. For smoke cessation I strongly encouraged the patient to quit smoking and offered her smoke cessation program. She was advised to call immediately she has any concerning symptoms in the interval.  The patient voices understanding of current disease status and treatment options and is in agreement with the current care plan.  All questions were answered. The patient knows to call the clinic with any problems, questions or concerns. We can certainly see the patient much sooner if necessary.  Thank you so much for allowing me to participate in the care of [name]. I will continue to follow up the patient with you and assist in her care.  I spent 40 minutes counseling the patient face to face. The total time spent in the appointment was 60 minutes.  Disclaimer: This note was dictated with voice recognition software. Similar sounding words can inadvertently be transcribed and may not be corrected upon review.   Curt Bears 11/08/2013, 2:59 PM

## 2013-11-09 ENCOUNTER — Other Ambulatory Visit: Payer: Self-pay | Admitting: Medical Oncology

## 2013-11-09 ENCOUNTER — Telehealth: Payer: Self-pay | Admitting: Medical Oncology

## 2013-11-09 NOTE — Telephone Encounter (Signed)
Requests refill for pain med. Note to dr [name].

## 2013-11-10 ENCOUNTER — Other Ambulatory Visit: Payer: Self-pay | Admitting: Internal Medicine

## 2013-11-10 ENCOUNTER — Other Ambulatory Visit: Payer: Self-pay | Admitting: Medical Oncology

## 2013-11-10 DIAGNOSIS — C3490 Malignant neoplasm of unspecified part of unspecified bronchus or lung: Secondary | ICD-10-CM

## 2013-11-10 DIAGNOSIS — R52 Pain, unspecified: Secondary | ICD-10-CM

## 2013-11-10 MED ORDER — OXYCODONE-ACETAMINOPHEN 5-325 MG PO TABS: 1.0000 | ORAL_TABLET | ORAL | Status: DC | PRN

## 2013-11-10 NOTE — Telephone Encounter (Signed)
Refill auth per Dr [name] and locked in injection room the patient notified to pick up.

## 2013-11-21 ENCOUNTER — Telehealth: Payer: Self-pay | Admitting: Medical Oncology

## 2013-11-21 NOTE — Telephone Encounter (Signed)
clarified appt

## 2013-11-22 ENCOUNTER — Ambulatory Visit (HOSPITAL_COMMUNITY)
Admission: RE | Admit: 2013-11-22 | Discharge: 2013-11-22 | Disposition: A | Discharge status: Home / Self Care | Payer: Medicare Other | Source: Ambulatory Visit | Attending: Internal Medicine | Admitting: Internal Medicine

## 2013-11-22 ENCOUNTER — Other Ambulatory Visit (HOSPITAL_COMMUNITY): Payer: Medicare Other

## 2013-11-22 ENCOUNTER — Encounter (HOSPITAL_COMMUNITY): Payer: Self-pay

## 2013-11-22 ENCOUNTER — Encounter (HOSPITAL_COMMUNITY)
Admission: RE | Admit: 2013-11-22 | Discharge: 2013-11-22 | Disposition: A | Discharge status: Home / Self Care | Payer: Medicare Other | Source: Ambulatory Visit | Attending: Internal Medicine | Admitting: Internal Medicine

## 2013-11-22 DIAGNOSIS — R222 Localized swelling, mass and lump, trunk: Secondary | ICD-10-CM | POA: Insufficient documentation

## 2013-11-22 DIAGNOSIS — R918 Other nonspecific abnormal finding of lung field: Secondary | ICD-10-CM

## 2013-11-22 DIAGNOSIS — C3490 Malignant neoplasm of unspecified part of unspecified bronchus or lung: Secondary | ICD-10-CM | POA: Insufficient documentation

## 2013-11-22 LAB — GLUCOSE, CAPILLARY: GLUCOSE-CAPILLARY: 101 mg/dL — AB (ref 70–99)

## 2013-11-22 MED ORDER — GADOBENATE DIMEGLUMINE 529 MG/ML IV SOLN
20.0000 mL | Freq: Once | INTRAVENOUS | Status: AC | PRN
  Administered 2013-11-22: 20 mL via INTRAVENOUS

## 2013-11-22 MED ORDER — FLUDEOXYGLUCOSE F - 18 (FDG) INJECTION
11.8000 | Freq: Once | INTRAVENOUS | Status: AC | PRN
  Administered 2013-11-22: 11.8 via INTRAVENOUS

## 2013-11-23 ENCOUNTER — Ambulatory Visit (INDEPENDENT_AMBULATORY_CARE_PROVIDER_SITE_OTHER): Payer: Medicare Other | Admitting: Cardiothoracic Surgery

## 2013-11-23 ENCOUNTER — Encounter: Payer: Self-pay | Admitting: Physician Assistant

## 2013-11-23 ENCOUNTER — Other Ambulatory Visit (HOSPITAL_BASED_OUTPATIENT_CLINIC_OR_DEPARTMENT_OTHER): Payer: Medicare Other

## 2013-11-23 ENCOUNTER — Ambulatory Visit: Payer: Medicare Other | Admitting: Internal Medicine

## 2013-11-23 ENCOUNTER — Ambulatory Visit (HOSPITAL_BASED_OUTPATIENT_CLINIC_OR_DEPARTMENT_OTHER): Payer: Medicare Other | Admitting: Physician Assistant

## 2013-11-23 ENCOUNTER — Encounter: Payer: Self-pay | Admitting: *Deleted

## 2013-11-23 ENCOUNTER — Encounter: Payer: Self-pay | Admitting: Cardiothoracic Surgery

## 2013-11-23 VITALS — BP 155/77 | HR 112 | Temp 98.6°F | Resp 23 | Ht 69.0 in | Wt 237.1 lb

## 2013-11-23 VITALS — BP 153/69 | HR 113 | Temp 99.7°F | Resp 22 | Ht 69.0 in | Wt 237.3 lb

## 2013-11-23 DIAGNOSIS — C3410 Malignant neoplasm of upper lobe, unspecified bronchus or lung: Secondary | ICD-10-CM

## 2013-11-23 DIAGNOSIS — B20 Human immunodeficiency virus [HIV] disease: Secondary | ICD-10-CM

## 2013-11-23 DIAGNOSIS — C3490 Malignant neoplasm of unspecified part of unspecified bronchus or lung: Secondary | ICD-10-CM

## 2013-11-23 DIAGNOSIS — R222 Localized swelling, mass and lump, trunk: Secondary | ICD-10-CM

## 2013-11-23 DIAGNOSIS — J189 Pneumonia, unspecified organism: Secondary | ICD-10-CM

## 2013-11-23 DIAGNOSIS — R52 Pain, unspecified: Secondary | ICD-10-CM

## 2013-11-23 DIAGNOSIS — R918 Other nonspecific abnormal finding of lung field: Secondary | ICD-10-CM

## 2013-11-23 DIAGNOSIS — F17200 Nicotine dependence, unspecified, uncomplicated: Secondary | ICD-10-CM

## 2013-11-23 LAB — COMPREHENSIVE METABOLIC PANEL (CC13)
ALT: 18 U/L (ref 0–55)
AST: 19 U/L (ref 5–34)
Albumin: 3.5 g/dL (ref 3.5–5.0)
Alkaline Phosphatase: 75 U/L (ref 40–150)
Anion Gap: 12 mEq/L — ABNORMAL HIGH (ref 3–11)
BILIRUBIN TOTAL: 0.21 mg/dL (ref 0.20–1.20)
BUN: 11.4 mg/dL (ref 7.0–26.0)
CO2: 26 mEq/L (ref 22–29)
CREATININE: 1.1 mg/dL (ref 0.6–1.1)
Calcium: 9 mg/dL (ref 8.4–10.4)
Chloride: 105 mEq/L (ref 98–109)
GLUCOSE: 152 mg/dL — AB (ref 70–140)
Potassium: 3.8 mEq/L (ref 3.5–5.1)
Sodium: 143 mEq/L (ref 136–145)
Total Protein: 7.6 g/dL (ref 6.4–8.3)

## 2013-11-23 LAB — CBC WITH DIFFERENTIAL/PLATELET
BASO%: 0.1 % (ref 0.0–2.0)
Basophils Absolute: 0 10*3/uL (ref 0.0–0.1)
EOS ABS: 0.1 10*3/uL (ref 0.0–0.5)
EOS%: 1.9 % (ref 0.0–7.0)
HEMATOCRIT: 45.9 % (ref 34.8–46.6)
HGB: 13.9 g/dL (ref 11.6–15.9)
LYMPH%: 36.3 % (ref 14.0–49.7)
MCH: 28.1 pg (ref 25.1–34.0)
MCHC: 30.3 g/dL — AB (ref 31.5–36.0)
MCV: 92.7 fL (ref 79.5–101.0)
MONO#: 0.3 10*3/uL (ref 0.1–0.9)
MONO%: 4.4 % (ref 0.0–14.0)
NEUT#: 4.2 10*3/uL (ref 1.5–6.5)
NEUT%: 57.3 % (ref 38.4–76.8)
PLATELETS: 217 10*3/uL (ref 145–400)
RBC: 4.95 10*6/uL (ref 3.70–5.45)
RDW: 15.5 % — ABNORMAL HIGH (ref 11.2–14.5)
WBC: 7.3 10*3/uL (ref 3.9–10.3)
lymph#: 2.6 10*3/uL (ref 0.9–3.3)

## 2013-11-23 MED ORDER — OXYCODONE-ACETAMINOPHEN 5-325 MG PO TABS: 1.0000 | ORAL_TABLET | ORAL | Status: DC | PRN

## 2013-11-23 NOTE — Progress Notes (Signed)
[address]       [place] [address]             [id number]                    [place] Record #[id number] Date of Birth: 11-21-1945  Referring: Dr Inda Merlin Primary Care: [name], MD  Chief Complaint:   Left lung mass  History of Present Illness:    [name] 68 y.o. female is seen in the office  today for left upper lobe lung mass hypermetabolic on recent PET scan. The patient has been on home oxygen for approximately 7 years, today in the office she dropped her sats to 84 while off oxygen walking. She is referred for consideration of surgical resection, so far there is no tissue diagnosis of the left upper lobe lesion. Reports from CT scan done in 2009 with presumed COPD exacerbation hospitalized in [facility] showed a 4 mm left upper lobe nodule, unclear if this is the same nodule. Her medical history significant for HIV, depression, osteoporosis, dyslipidemia, adrenal insufficiency, COPD and long history of smoking. She presented to the emergency Department at [facility] complaining of a one-month history of chest congestion, shortness of breath as well as dry cough. Chest x-ray on admission 11/03/2013 showed no acute infiltrate or pulmonary edema. This was followed by CT angiogram of the chest on 11/03/2013 and it showed no evidence for pulmonary embolus but there is a spiculated density mass in the inferior lingula highly suspicious for bronchogenic carcinoma. The patient was treated with nebulizer and steroids and had improvement in her condition. She is currently on home oxygen.   she was referred to me for evaluation and recommendation regarding the new finding in her CT scan of the chest.  She has no significant weight loss or night sweats. She has occasional headache but no visual changes.    Family history significant for a mother and father are alcoholics. Her sister had pancreatic cancer and lung  cancer and brother had pancreatic cancer.  The patient is single and has no children. She lives by herself and [place]. She has a lot of family members around including her brother who was the main caregiver.  She has a history of smoking one pack per day for around 45 years and unfortunately she continues to smoke 2 cigarettes every day. She also has a history of alcohol abuse in the past but not recently and no history of drug abuse.     She is followed by Dr. Megan Salon for  HIV. HIV that has been perfectly controlled on Atripla . Her most recent viral load shows her to be essentially undetectable with a healthy CD4 count above 900.      Current Activity/ Functional Status:  Patient is independent with mobility/ambulation, transfers, ADL's, IADL's.   Zubrod Score: At the time of surgery this patient's most appropriate activity status/level should be described as: []     0    Normal activity, no symptoms []     1    Restricted in physical strenuous activity but ambulatory, able to do out light work [x]     2    Ambulatory and capable of self care, unable to do work activities, up and about               >50 % of waking hours                              []   3    Only limited self care, in bed greater than 50% of waking hours []     4    Completely disabled, no self care, confined to bed or chair []     5    Moribund   Past Medical History  Diagnosis Date  . COPD (chronic obstructive pulmonary disease)   . Hyperlipidemia   . HIV (human immunodeficiency virus infection)   . Asthma     Past Surgical History  Procedure Laterality Date  . Abcess drainage      abcess under Left arm, abcess from L breast  . Thyroid gland removed    . Removal of abnormal cells on uterus      Family History  Problem Relation Age of Onset  . Pneumonia Mother   . Cancer Sister   . Hypertension Sister   . Diabetes Sister       History  Smoking status  . Current Some Day Smoker -- 0.25  packs/day for 40 years  . Types: Cigarettes  Smokeless tobacco  . Never Used    Comment: congratulated! stopped for surgery    History  Alcohol Use No    Comment: none in 20 years     No Known Allergies  Current Outpatient Prescriptions  Medication Sig Dispense Refill  . albuterol (PROVENTIL HFA;VENTOLIN HFA) 108 (90 BASE) MCG/ACT inhaler Inhale into the lungs 2 (two) times daily.      Marland Kitchen albuterol (PROVENTIL) (2.5 MG/3ML) 0.083% nebulizer solution Take 3 mLs (2.5 mg total) by nebulization every 4 (four) hours as needed for wheezing or shortness of breath.  75 mL  5  . aspirin EC 81 MG tablet Take 81 mg by mouth daily.      . ATRIPLA 600-200-300 MG per tablet TAKE 1 TABLET BY MOUTH EVERY NIGHT AT BEDTIME *APPOINTMENT REQUIRED FOR REFILLS*  30 tablet  0  . Fluticasone-Salmeterol (ADVAIR) 500-50 MCG/DOSE AEPB Inhale 1 puff into the lungs every 12 (twelve) hours.      . gabapentin (NEURONTIN) 300 MG capsule Take 300 mg by mouth 3 (three) times daily.      Marland Kitchen levofloxacin (LEVAQUIN) 750 MG tablet Take 1 tablet (750 mg total) by mouth daily.  5 tablet  0  . loratadine (CLARITIN) 10 MG tablet Take 10 mg by mouth daily.      . meloxicam (MOBIC) 15 MG tablet Take 15 mg by mouth daily.      . montelukast (SINGULAIR) 10 MG tablet Take 1 tablet (10 mg total) by mouth at bedtime.  30 tablet  6  . nicotine (NICODERM CQ - DOSED IN MG/24 HOURS) 21 mg/24hr patch Place 1 patch (21 mg total) onto the skin daily.  28 patch  0  . oxyCODONE-acetaminophen (PERCOCET/ROXICET) 5-325 MG per tablet Take 1 tablet by mouth every 4 (four) hours as needed.  30 tablet  0  . predniSONE (DELTASONE) 10 MG tablet Take 3 tablets (30 mg total) by mouth daily with breakfast. 3 tablets daily for 3 days then 2 tablets daily for 3 days the 1 tablet daily for 3 days then stop  18 tablet  0  . venlafaxine XR (EFFEXOR-XR) 150 MG 24 hr capsule Take 150 mg by mouth daily.      . vitamin B-12 (CYANOCOBALAMIN) 250 MCG tablet Take 250  mcg by mouth daily.       No current facility-administered medications for this visit.     Review of Systems:     Cardiac Review of Systems:  Y or N  Chest Pain [  n  ]  Resting SOB Blue.Reese ] Exertional SOB  Blue.Reese  ]  Orthopnea [ n ]   Pedal Edema [ y  ]    Palpitations [ n ] Syncope  Blue.Reese  ]   Presyncope Florencio.Farrier   ]  General Review of Systems: [Y] = yes [  ]=no Constitional: recent weight change [ n ];  Wt loss over the last 3 months [   ] anorexia [  ]; fatigue [  ]; nausea [  ]; night sweats [  ]; fever [  ]; or chills [  ];          Dental: poor dentition[ y dentures ]; Last Dentist visit:   Eye : blurred vision [  ]; diplopia [   ]; vision changes [  ];  Amaurosis fugax[  ]; Resp: cough Blue.Reese  ];  wheezing[ y ];  hemoptysis[n  ]; shortness of breath[ y ]; paroxysmal nocturnal dyspnea[ y ]; dyspnea on exertion[ y ]; or orthopnea[y  ];  GI:  gallstones[  ], vomiting[  ];  dysphagia[  ]; melena[  ];  hematochezia [  ]; heartburn[  ];   Hx of  Colonoscopy[  ]; GU: kidney stones [  ]; hematuria[  ];   dysuria [  ];  nocturia[  ];  history of     obstruction [  ]; urinary frequency [  ]             Skin: rash, swelling[  ];, hair loss[  ];  peripheral edema[  ];  or itching[  ]; Musculosketetal: myalgias[y  ];  joint swelling[ y ];  joint erythema[  ];  joint pain[  ];  back pain[  ];  Heme/Lymph: bruising[  ];  bleeding[  ];  anemia[  ];  Neuro: TIA[  ];  headaches[  ];  stroke[  ];  vertigo[  ];  seizures[n  ];   paresthesias[n  ];  difficulty walking[y  ];  Psych:depression[  ]; anxiety[  ];  Endocrine: diabetes[ n ];  thyroid dysfunction[  ];  Immunizations: Flu up to date [  ]; Pneumococcal up to date [  ];  Other:  Physical Exam: BP 153/69  Pulse 113  Temp(Src) 99.7 F (37.6 C) (Oral)  Resp 22  Ht 5\' 9"  (1.753 m)  Wt 237 lb 4.8 oz (107.639 kg)  BMI 35.03 kg/m2  SpO2 82%  PHYSICAL EXAMINATION:  General appearance: alert, cooperative, appears older than stated age, fatigued and no  distress Neurologic: intact Heart: regular rate and rhythm, S1, S2 normal, no murmur, click, rub or gallop Lungs: diminished breath sounds bilaterally Abdomen: soft, non-tender; bowel sounds normal; no masses,  no organomegaly Extremities: extremities normal, atraumatic, no cyanosis or edema and Homans sign is negative, no sign of DVT Patient has no carotid bruits, no cervical or supraclavicular or axillary adenopathy. She is on oxygen at the time of exam  Diagnostic Studies & Laboratory data:     Recent Radiology Findings:   Dg Chest 2 View  11/03/2013   CLINICAL DATA:  Worsening  rales, productive cough  EXAM: CHEST  2 VIEW  COMPARISON:  10/30/2013  FINDINGS: Cardiomediastinal silhouette is stable. No acute infiltrate or pleural effusion. No pulmonary edema. Central mild bronchitic changes. Stable scarring lingula and left base.  IMPRESSION: No acute infiltrate or pulmonary edema. Central mild bronchitic changes.   Electronically Signed   By: Lahoma Crocker  M.D.   On: 11/03/2013 12:36   Dg Chest 2 View (if Patient Has Fever And/or Copd)  10/30/2013   CLINICAL DATA:  Shortness of breath, cough, right-sided chest pain  EXAM: CHEST  2 VIEW  COMPARISON:  07/03/2013  FINDINGS: Chronic interstitial markings/bronchitic changes. Mild chronic linear/patchy opacity in the left upper lobe and lingula, similar to 2013, likely scarring.  No focal consolidation.  No pleural effusion or pneumothorax.  The heart is normal in size.  Mild degenerative changes of the visualized thoracolumbar spine.  IMPRESSION: No evidence of acute cardiopulmonary disease.  Chronic bronchitic changes with left upper lobe/lingular scarring.   Electronically Signed   By: Julian Hy M.D.   On: 10/30/2013 15:58   Ct Angio Chest Pe W/cm &/or Wo Cm  11/03/2013   CLINICAL DATA:  Shortness of breath, chest pain, history of COPD  EXAM: CT ANGIOGRAPHY CHEST WITH CONTRAST  TECHNIQUE: Multidetector CT imaging of the chest was performed using  the standard protocol during bolus administration of intravenous contrast. Multiplanar CT image reconstructions and MIPs were obtained to evaluate the vascular anatomy.  CONTRAST:  121mL OMNIPAQUE IOHEXOL 350 MG/ML SOLN  COMPARISON:  None.  FINDINGS: This study is mildly to moderately limited for combination of factors including suboptimal opacification of the pulmonary arterial system (despite repeating the study) hand respiratory motion artifact. Allowing for these limitations, there are no pulmonary arterial emboli identified.  There is calcification of the aorta and there is moderate plaque within the lumen of the descending thoracic aorta focally in its midsection. There is no hilar or mediastinal adenopathy. There are no pleural or pericardial effusions.  Scans through the upper abdomen are unremarkable. There are no acute musculoskeletal findings.  There is mild perihilar peribronchial wall thickening. There is mild interstitial change at both lung bases with mild atelectatic change at both lung bases is well. There is no consolidation or effusion. In the inferior lingula there is a spiculated 2.3 cm mass.  Review of the MIP images confirms the above findings.  IMPRESSION: 1. No evidence of pulmonary embolism although the study is limited 2. Mild chronic bronchitic change 3. Mass in the inferior lingula highly suspicious for bronchogenic carcinoma. These results will be called to the ordering clinician or representative by the Radiologist Assistant, and communication documented in the PACS Dashboard.   Electronically Signed   By: Skipper Cliche M.D.   On: 11/03/2013 16:46   Mr Jeri Cos WU Contrast  11/22/2013   CLINICAL DATA:  Non-small cell lung cancer.  EXAM: MRI HEAD WITHOUT AND WITH CONTRAST  TECHNIQUE: Multiplanar, multiecho pulse sequences of the brain and surrounding structures were obtained without and with intravenous contrast.  CONTRAST:  48mL MULTIHANCE GADOBENATE DIMEGLUMINE 529 MG/ML IV SOLN   COMPARISON:  MRI brain 09/12/2007  FINDINGS: No acute infarct, hemorrhage, or mass lesion is present.  The postcontrast images demonstrate no evidence for pathologic enhancement. The ventricles are of normal size. No significant extra-axial fluid collection is present. Scattered subcortical T2 hyperintensities are slightly exaggerated for age.  Flow is present in the major intracranial arteries. The globes and orbits are intact. Minimal mucosal thickening is present in the right maxillary sinus. The remaining paranasal sinuses and mastoid air cells are clear.  Midline structures demonstrate some heterogeneity of marrow signal within the upper cervical spine. Osseous metastases are not excluded. However, there were no bone metastases in the PET scan of the same day. The signal change is likely degenerative.  IMPRESSION: 1. No evidence  for metastatic disease to the brain. 2. Marrow signal heterogeneity in the upper cervical spine is likely degenerative without evidence for osseous metastases on the PET scan. 3. Scattered subcortical T2 hyperintensities are present bilaterally. These likely reflect the sequelae of chronic microvascular ischemia.   Electronically Signed   By: Lawrence Santiago M.D.   On: 11/22/2013 17:15   Nm Pet Image Initial (pi) Skull Base To Thigh  11/22/2013   CLINICAL DATA:  Initial treatment strategy for solitary left lung nodule.  EXAM: NUCLEAR MEDICINE PET SKULL BASE TO THIGH  TECHNIQUE: 11.8 mCi F-18 FDG was injected intravenously. Full-ring PET imaging was performed from the skull base to thigh after the radiotracer. CT data was obtained and used for attenuation correction and anatomic localization.  FASTING BLOOD GLUCOSE:  Value: 101 mg/dl  COMPARISON:  Chest CTA on 11/03/2013  FINDINGS: NECK  No hypermetabolic lymph nodes in the neck.  CHEST  No hypermetabolic mediastinal or hilar nodes. 2.2 cm spiculated nodule in the posterior left upper lobe is hypermetabolic, with SUV max of 11.3,  consistent with primary bronchogenic carcinoma. No other suspicious pulmonary nodules identified.  Tiny less than 1 cm soft tissue nodule in the right superior breast has maximum SUV of 2.4, and a similar less than 1 cm soft tissue nodule in the superior left breast has a maximum SUV of 1.0. Less than 1 cm axillary lymph nodes are also seen bilaterally with low-grade metabolic activity, with SUV max on the right of 1.5 and on the left of 1.3. These findings are of uncertain clinical significance.  ABDOMEN/PELVIS  No abnormal hypermetabolic activity within the liver, pancreas, adrenal glands, or spleen. No hypermetabolic lymph nodes in the abdomen or pelvis.  SKELETON  No focal hypermetabolic activity to suggest skeletal metastasis.  IMPRESSION: Hypermetabolic 2.2 cm spiculated nodule in the posterior left upper lobe, consistent with primary bronchogenic carcinoma. No evidence nodal or distant metastatic disease.  Tiny less than 1 cm bilateral breast nodules and axillary lymph nodes show low-grade metabolic activity, which is nonspecific. These findings are of uncertain clinical significance. Correlation with diagnostic mammography recommended for further evaluation.   Electronically Signed   By: Earle Gell M.D.   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 The patient is currently on oxygen and just  discharged from the hospital 2 weeks ago for COPD exacerbation.- We'll obtain full pulmonary function studies Because of PET findings in the breast and the fact she's had no mammogram for the last 8 years, mammogram will be obtained Depending on her pulmonary function studies, especially if they are extremely poor and we make a decision that she will not be a surgical candidate we'll proceed with needle biopsy of the left upper lobe lung  lesion and referral for consideration of stereotactic radiotherapy to the left upper lobe lesion after a tissue diagnosis is obtained.      Diagnosis Date Noted  . HIV DISEASE 12/09/2006  . DEPRESSION 12/09/2006  . BREAST MASS, BENIGN 12/09/2006  . OSTEOPOROSIS 12/09/2006  . ALLERGIC RHINITIS, SEASONAL 07/03/2010  . COPD 07/03/2010  . Hyperlipidemia 12/11/2010  . Hypertension 12/11/2010  . Asthma 12/10/2011  . Cigarette smoker 12/10/2011  . Obesity 05/05/2012  . Renal insufficiency 05/05/2012  . Hyperglycemia 05/05/2012  . Rectal bleed 11/19/2012  . Other and unspecified noninfectious gastroenteritis and colitis(558.9) 11/20/2012  . COPD with acute exacerbation 10/30/2013  . COPD exacerbation 10/30/2013  . Acute respiratory failure 10/31/2013       I spent 55 minutes counseling the patient face to face. The total time spent in the appointment was 80 minutes.  Grace Isaac MD      [address] [place] [address] Office [phone]   Beeper [phone]  11/23/2013 6:10 PM

## 2013-11-23 NOTE — Patient Instructions (Signed)
The MRI of your brain was negative for brain metastasis. Your PET scan was negative for any other areas of metastasis other than what is within your lung. We will schedule his see one of the surgeons to discuss possible excision of your tumor. Your strongly advised to discontinue all smoking.

## 2013-11-23 NOTE — Progress Notes (Addendum)
No images are attached to the encounter. No scans are attached to the encounter. No scans are attached to the encounter. [facility] SHARED VISIT PROGRESS NOTE  [name], MD [address] [place] [address]  DIAGNOSIS: Lung cancer   Primary site: Lung (Left)   Staging method: AJCC 7th Edition   Clinical: Stage IA (T1b, N0, M0) signed by Curt Bears, MD on 11/08/2013  4:38 PM   Summary: Stage IA (T1b, N0, M0)  PRIOR THERAPY: None  CURRENT THERAPY: None  INTERVAL HISTORY: [name] 68 y.o. female returns for a scheduled regular office visit for followup of her recently diagnosed stage IA non-small cell lung cancer. He recently had a  MRI of the brain as well as PET CT to complete her staging. She presents to discuss the results. She is currently utilizing oxygen on an as-needed basis. She uses it primarily at night. She's currently on 4 L via nasal cannula. Unfortunately she continues to smoke a few cigarettes. She's currently using nicotine patches today with a smoking cessation. Today she presents complaining of some shortness of breath and fatigue but no other specific complaints. She is followed by Dr. Megan Salon for her HIV disease.   MEDICAL HISTORY: Past Medical History  Diagnosis Date  . COPD (chronic obstructive pulmonary disease)   . Hyperlipidemia   . HIV (human immunodeficiency virus infection)   . Asthma     ALLERGIES:  has No Known Allergies.  MEDICATIONS:  Current Outpatient Prescriptions  Medication Sig Dispense Refill  . albuterol (PROVENTIL HFA;VENTOLIN HFA) 108 (90 BASE) MCG/ACT inhaler Inhale into the lungs 2 (two) times daily.      Marland Kitchen albuterol (PROVENTIL) (2.5 MG/3ML) 0.083% nebulizer solution Take 3 mLs (2.5 mg total) by nebulization every 4 (four) hours as needed for wheezing or shortness of breath.  75 mL  5  . aspirin EC 81 MG tablet Take 81 mg by mouth daily.      . ATRIPLA 600-200-300 MG per tablet TAKE 1 TABLET BY MOUTH  EVERY NIGHT AT BEDTIME *APPOINTMENT REQUIRED FOR REFILLS*  30 tablet  0  . Fluticasone-Salmeterol (ADVAIR) 500-50 MCG/DOSE AEPB Inhale 1 puff into the lungs every 12 (twelve) hours.      . gabapentin (NEURONTIN) 300 MG capsule Take 300 mg by mouth 3 (three) times daily.      Marland Kitchen loratadine (CLARITIN) 10 MG tablet Take 10 mg by mouth daily.      . meloxicam (MOBIC) 15 MG tablet Take 15 mg by mouth daily.      . montelukast (SINGULAIR) 10 MG tablet Take 1 tablet (10 mg total) by mouth at bedtime.  30 tablet  6  . nicotine (NICODERM CQ - DOSED IN MG/24 HOURS) 21 mg/24hr patch Place 1 patch (21 mg total) onto the skin daily.  28 patch  0  . oxyCODONE-acetaminophen (PERCOCET/ROXICET) 5-325 MG per tablet Take 1 tablet by mouth every 4 (four) hours as needed.  30 tablet  0  . venlafaxine XR (EFFEXOR-XR) 150 MG 24 hr capsule Take 150 mg by mouth daily.      . vitamin B-12 (CYANOCOBALAMIN) 250 MCG tablet Take 250 mcg by mouth daily.      Marland Kitchen levofloxacin (LEVAQUIN) 750 MG tablet Take 1 tablet (750 mg total) by mouth daily.  5 tablet  0  . predniSONE (DELTASONE) 10 MG tablet Take 3 tablets (30 mg total) by mouth daily with breakfast. 3 tablets daily for 3 days then 2 tablets daily  for 3 days the 1 tablet daily for 3 days then stop  18 tablet  0   No current facility-administered medications for this visit.    SURGICAL HISTORY:  Past Surgical History  Procedure Laterality Date  . Abcess drainage      abcess under Left arm, abcess from L breast  . Thyroid gland removed    . Removal of abnormal cells on uterus      REVIEW OF SYSTEMS:  A comprehensive review of systems was negative except for: Constitutional: positive for fatigue Respiratory: positive for cough and dyspnea on exertion   PHYSICAL EXAMINATION: General appearance: alert, cooperative, appears stated age and no distress Head: Normocephalic, without obvious abnormality, atraumatic Neck: no adenopathy, no carotid bruit, no JVD, supple,  symmetrical, trachea midline and thyroid not enlarged, symmetric, no tenderness/mass/nodules Lymph nodes: Cervical, supraclavicular, and axillary nodes normal. Resp: clear to auscultation bilaterally Back: symmetric, no curvature. ROM normal. No CVA tenderness. Cardio: regular rate and rhythm, S1, S2 normal, no murmur, click, rub or gallop GI: soft, non-tender; bowel sounds normal; no masses,  no organomegaly Extremities: extremities normal, atraumatic, no cyanosis or edema Neurologic: Alert and oriented X 3, normal strength and tone. Normal symmetric reflexes. Normal coordination and gait  ECOG PERFORMANCE STATUS: 1 - Symptomatic but completely ambulatory  Blood pressure 155/77, pulse 112, temperature 98.6 F (37 C), temperature source Oral, resp. rate 23, height 5\' 9"  (1.753 m), weight 237 lb 1.6 oz (107.548 kg).  LABORATORY DATA: Lab Results  Component Value Date   WBC 7.3 11/23/2013   HGB 13.9 11/23/2013   HCT 45.9 11/23/2013   MCV 92.7 11/23/2013   PLT 217 11/23/2013      Chemistry      Component Value Date/Time   NA 143 11/23/2013 0926   NA 141 11/04/2013 0558   K 3.8 11/23/2013 0926   K 4.1 11/04/2013 0558   CL 100 11/04/2013 0558   CO2 26 11/23/2013 0926   CO2 30 11/04/2013 0558   BUN 11.4 11/23/2013 0926   BUN 23 11/04/2013 0558   CREATININE 1.1 11/23/2013 0926   CREATININE 1.12* 11/06/2013 0440   CREATININE 1.16* 07/04/2012 1402      Component Value Date/Time   CALCIUM 9.0 11/23/2013 0926   CALCIUM 9.8 11/04/2013 0558   ALKPHOS 75 11/23/2013 0926   ALKPHOS 74 11/21/2012 0232   AST 19 11/23/2013 0926   AST 12 11/21/2012 0232   ALT 18 11/23/2013 0926   ALT 9 11/21/2012 0232   BILITOT 0.21 11/23/2013 0926   BILITOT 0.1* 11/21/2012 0232       RADIOGRAPHIC STUDIES:  Dg Chest 2 View  11/03/2013   CLINICAL DATA:  Worsening  rales, productive cough  EXAM: CHEST  2 VIEW  COMPARISON:  10/30/2013  FINDINGS: Cardiomediastinal silhouette is stable. No acute infiltrate or pleural effusion. No  pulmonary edema. Central mild bronchitic changes. Stable scarring lingula and left base.  IMPRESSION: No acute infiltrate or pulmonary edema. Central mild bronchitic changes.   Electronically Signed   By: Lahoma Crocker M.D.   On: 11/03/2013 12:36   Dg Chest 2 View (if Patient Has Fever And/or Copd)  10/30/2013   CLINICAL DATA:  Shortness of breath, cough, right-sided chest pain  EXAM: CHEST  2 VIEW  COMPARISON:  07/03/2013  FINDINGS: Chronic interstitial markings/bronchitic changes. Mild chronic linear/patchy opacity in the left upper lobe and lingula, similar to 2013, likely scarring.  No focal consolidation.  No pleural effusion or pneumothorax.  The heart  is normal in size.  Mild degenerative changes of the visualized thoracolumbar spine.  IMPRESSION: No evidence of acute cardiopulmonary disease.  Chronic bronchitic changes with left upper lobe/lingular scarring.   Electronically Signed   By: Julian Hy M.D.   On: 10/30/2013 15:58   Ct Angio Chest Pe W/cm &/or Wo Cm  11/03/2013   CLINICAL DATA:  Shortness of breath, chest pain, history of COPD  EXAM: CT ANGIOGRAPHY CHEST WITH CONTRAST  TECHNIQUE: Multidetector CT imaging of the chest was performed using the standard protocol during bolus administration of intravenous contrast. Multiplanar CT image reconstructions and MIPs were obtained to evaluate the vascular anatomy.  CONTRAST:  13mL OMNIPAQUE IOHEXOL 350 MG/ML SOLN  COMPARISON:  None.  FINDINGS: This study is mildly to moderately limited for combination of factors including suboptimal opacification of the pulmonary arterial system (despite repeating the study) hand respiratory motion artifact. Allowing for these limitations, there are no pulmonary arterial emboli identified.  There is calcification of the aorta and there is moderate plaque within the lumen of the descending thoracic aorta focally in its midsection. There is no hilar or mediastinal adenopathy. There are no pleural or pericardial  effusions.  Scans through the upper abdomen are unremarkable. There are no acute musculoskeletal findings.  There is mild perihilar peribronchial wall thickening. There is mild interstitial change at both lung bases with mild atelectatic change at both lung bases is well. There is no consolidation or effusion. In the inferior lingula there is a spiculated 2.3 cm mass.  Review of the MIP images confirms the above findings.  IMPRESSION: 1. No evidence of pulmonary embolism although the study is limited 2. Mild chronic bronchitic change 3. Mass in the inferior lingula highly suspicious for bronchogenic carcinoma. These results will be called to the ordering clinician or representative by the Radiologist Assistant, and communication documented in the PACS Dashboard.   Electronically Signed   By: Skipper Cliche M.D.   On: 11/03/2013 16:46   Mr Jeri Cos ZS Contrast  11/22/2013   CLINICAL DATA:  Non-small cell lung cancer.  EXAM: MRI HEAD WITHOUT AND WITH CONTRAST  TECHNIQUE: Multiplanar, multiecho pulse sequences of the brain and surrounding structures were obtained without and with intravenous contrast.  CONTRAST:  75mL MULTIHANCE GADOBENATE DIMEGLUMINE 529 MG/ML IV SOLN  COMPARISON:  MRI brain 09/12/2007  FINDINGS: No acute infarct, hemorrhage, or mass lesion is present.  The postcontrast images demonstrate no evidence for pathologic enhancement. The ventricles are of normal size. No significant extra-axial fluid collection is present. Scattered subcortical T2 hyperintensities are slightly exaggerated for age.  Flow is present in the major intracranial arteries. The globes and orbits are intact. Minimal mucosal thickening is present in the right maxillary sinus. The remaining paranasal sinuses and mastoid air cells are clear.  Midline structures demonstrate some heterogeneity of marrow signal within the upper cervical spine. Osseous metastases are not excluded. However, there were no bone metastases in the PET scan  of the same day. The signal change is likely degenerative.  IMPRESSION: 1. No evidence for metastatic disease to the brain. 2. Marrow signal heterogeneity in the upper cervical spine is likely degenerative without evidence for osseous metastases on the PET scan. 3. Scattered subcortical T2 hyperintensities are present bilaterally. These likely reflect the sequelae of chronic microvascular ischemia.   Electronically Signed   By: Lawrence Santiago M.D.   On: 11/22/2013 17:15   Nm Pet Image Initial (pi) Skull Base To Thigh  11/22/2013   CLINICAL DATA:  Initial treatment strategy for solitary left lung nodule.  EXAM: NUCLEAR MEDICINE PET SKULL BASE TO THIGH  TECHNIQUE: 11.8 mCi F-18 FDG was injected intravenously. Full-ring PET imaging was performed from the skull base to thigh after the radiotracer. CT data was obtained and used for attenuation correction and anatomic localization.  FASTING BLOOD GLUCOSE:  Value: 101 mg/dl  COMPARISON:  Chest CTA on 11/03/2013  FINDINGS: NECK  No hypermetabolic lymph nodes in the neck.  CHEST  No hypermetabolic mediastinal or hilar nodes. 2.2 cm spiculated nodule in the posterior left upper lobe is hypermetabolic, with SUV max of 11.3, consistent with primary bronchogenic carcinoma. No other suspicious pulmonary nodules identified.  Tiny less than 1 cm soft tissue nodule in the right superior breast has maximum SUV of 2.4, and a similar less than 1 cm soft tissue nodule in the superior left breast has a maximum SUV of 1.0. Less than 1 cm axillary lymph nodes are also seen bilaterally with low-grade metabolic activity, with SUV max on the right of 1.5 and on the left of 1.3. These findings are of uncertain clinical significance.  ABDOMEN/PELVIS  No abnormal hypermetabolic activity within the liver, pancreas, adrenal glands, or spleen. No hypermetabolic lymph nodes in the abdomen or pelvis.  SKELETON  No focal hypermetabolic activity to suggest skeletal metastasis.  IMPRESSION:  Hypermetabolic 2.2 cm spiculated nodule in the posterior left upper lobe, consistent with primary bronchogenic carcinoma. No evidence nodal or distant metastatic disease.  Tiny less than 1 cm bilateral breast nodules and axillary lymph nodes show low-grade metabolic activity, which is nonspecific. These findings are of uncertain clinical significance. Correlation with diagnostic mammography recommended for further evaluation.   Electronically Signed   By: Earle Gell M.D.   On: 11/22/2013 16:57     ASSESSMENT/PLAN: Patient is a pleasant 68 year old African American female recently diagnosed with stage IA non-small cell lung cancer. The MRI of her brain was negative for brain metastasis. The PET scan did not reveal any nodal or distant metastasis. Patient was discussed with also seen by Dr. [name]. We will arrange for the patient to be evaluated by one of our surgeons for possible surgical excision of her single all lung lesion. Patient was strongly advised to discontinue smoking. The danger smoking and wall using oxygen was explained to the patient and she voiced understanding. Further followup will be determined after evaluation by surgery.     Carlton Adam, PA-C  ADDENDUM: Hematology/Oncology Attending:  I had a face to face encounter with the patient. I recommended her care plan. This is a very pleasant 68 years old [place] American female with history of COPD, HIV and recently found to have left upper lobe lung nodule suspicious for stage I lung cancer. Staging workup including MRI of the brain was negative and a PET scan showed no metastatic disease in the mediastinum or extrathoracic. I discussed the imaging results with the patient today. I recommended for her to see Dr. Servando Snare for evaluation and discussion of surgical options if feasible. If she is not a surgical candidate will proceed with biopsy followed by stereotactic radiotherapy. I would see her back for follow up visit after  complete evaluation by Dr. Servando Snare. She was given a refill of her pain medication today. She was advised to call immediately if she has any concerning symptoms in the interval. All questions were answered. The patient knows to call the clinic with any problems, questions or concerns. We can certainly see the patient much sooner  if necessary.  Disclaimer: This note was dictated with voice recognition software. Similar sounding words can inadvertently be transcribed and may not be corrected upon review. Curt Bears, MD 11/28/2013

## 2013-11-27 ENCOUNTER — Encounter: Payer: Self-pay | Admitting: Pulmonary Disease

## 2013-11-27 ENCOUNTER — Ambulatory Visit (INDEPENDENT_AMBULATORY_CARE_PROVIDER_SITE_OTHER): Payer: Medicare Other | Admitting: Pulmonary Disease

## 2013-11-27 VITALS — BP 132/74 | HR 85 | Temp 97.3°F | Ht 69.0 in | Wt 238.4 lb

## 2013-11-27 DIAGNOSIS — C3490 Malignant neoplasm of unspecified part of unspecified bronchus or lung: Secondary | ICD-10-CM

## 2013-11-27 DIAGNOSIS — J441 Chronic obstructive pulmonary disease with (acute) exacerbation: Secondary | ICD-10-CM

## 2013-11-27 MED ORDER — TIOTROPIUM BROMIDE MONOHYDRATE 2.5 MCG/ACT IN AERS: 2.0000 | INHALATION_SPRAY | Freq: Every day | RESPIRATORY_TRACT | Status: DC

## 2013-11-27 NOTE — Patient Instructions (Signed)
Schedule breathing test You have severe COPD - you have to STOP smoking!! Stay on advair twice daily Trial of spiriva- call for Rx if this works Stay on oxygen continuous  I will discuss with Dr Servando Snare the best way to biopsy the nodule in your left lung

## 2013-11-27 NOTE — Assessment & Plan Note (Signed)
ENB vs CT guided TTNA - risk either way I do see an airway leading to the nodule - will discuss with dr gerhardt

## 2013-11-27 NOTE — Assessment & Plan Note (Signed)
-  resolved Schedule breathing test You have severe COPD - you have to STOP smoking!! Stay on advair twice daily Trial of spiriva- call for Rx if this works Stay on oxygen continuous

## 2013-11-27 NOTE — Progress Notes (Signed)
   Subjective:    Patient ID: [name], female    DOB: 05-Mar-1946, 68 y.o.   MRN: [id number]  HPI  68 y/o F, with PMH of 042 positive, O2 2L dependent COPD admitted 10/30/13 with increasing SOB & chest congestion thought to be and AECOPD. Patient was slow to resolve with ongoing hypoxia & PCCM consulted for evaluation. 11/01/13 - Absolute CD4 990, HIV RNA 27 CTA neg for PE, no ASD on ECHO, no obvious PAH Received 5 days abx for COPD exacerbation coverage, tapered pred to off x 1 week since bspasm resolved  CT chest showed Lingular  spiculated 2.3 cm Mass - concerning for bronchogenic carcinoma  PET - Hypermetabolic 2.2 cm spiculated nodule in the posterior left upper lobe. Tiny less than 1 cm bilateral breast nodules and axillary lymph nodes showed low-grade metabolic activity, nonspecific - seen by TCTS, mammogram requested  MRI brain neg  Review of Systems neg for any significant sore throat, dysphagia, itching, sneezing, nasal congestion or excess/ purulent secretions, fever, chills, sweats, unintended wt loss, pleuritic or exertional cp, hempoptysis, orthopnea pnd or change in chronic leg swelling. Also denies presyncope, palpitations, heartburn, abdominal pain, nausea, vomiting, diarrhea or change in bowel or urinary habits, dysuria,hematuria, rash, arthralgias, visual complaints, headache, numbness weakness or ataxia.     Objective:   Physical Exam  Gen. Pleasant, obese, in no distress, normal affect ENT - no lesions, no post nasal drip, class 2-3 airway Neck: No JVD, no thyromegaly, no carotid bruits Lungs: no use of accessory muscles, no dullness to percussion, decreased without rales or rhonchi  Cardiovascular: Rhythm regular, heart sounds  normal, no murmurs or gallops, no peripheral edema Abdomen: soft and non-tender, no hepatosplenomegaly, BS normal. Musculoskeletal: No deformities, no cyanosis or clubbing Neuro:  alert, non focal, no tremors       Assessment & Plan:

## 2013-12-04 ENCOUNTER — Other Ambulatory Visit: Payer: Self-pay | Admitting: Cardiothoracic Surgery

## 2013-12-04 DIAGNOSIS — Z1231 Encounter for screening mammogram for malignant neoplasm of breast: Secondary | ICD-10-CM

## 2013-12-06 ENCOUNTER — Encounter: Payer: Medicare Other | Admitting: Cardiothoracic Surgery

## 2013-12-06 ENCOUNTER — Ambulatory Visit (HOSPITAL_COMMUNITY)
Admission: RE | Admit: 2013-12-06 | Discharge: 2013-12-06 | Disposition: A | Discharge status: Home / Self Care | Payer: Medicare Other | Source: Ambulatory Visit | Attending: Pulmonary Disease | Admitting: Pulmonary Disease

## 2013-12-06 ENCOUNTER — Ambulatory Visit (HOSPITAL_COMMUNITY): Admission: RE | Admit: 2013-12-06 | Payer: Medicare Other | Source: Ambulatory Visit

## 2013-12-06 DIAGNOSIS — J449 Chronic obstructive pulmonary disease, unspecified: Secondary | ICD-10-CM | POA: Insufficient documentation

## 2013-12-06 DIAGNOSIS — J441 Chronic obstructive pulmonary disease with (acute) exacerbation: Secondary | ICD-10-CM

## 2013-12-06 DIAGNOSIS — J4489 Other specified chronic obstructive pulmonary disease: Secondary | ICD-10-CM | POA: Insufficient documentation

## 2013-12-06 DIAGNOSIS — F17200 Nicotine dependence, unspecified, uncomplicated: Secondary | ICD-10-CM | POA: Insufficient documentation

## 2013-12-06 MED ORDER — ALBUTEROL SULFATE (2.5 MG/3ML) 0.083% IN NEBU
2.5000 mg | INHALATION_SOLUTION | Freq: Once | RESPIRATORY_TRACT | Status: AC
  Administered 2013-12-06: 2.5 mg via RESPIRATORY_TRACT

## 2013-12-07 ENCOUNTER — Ambulatory Visit (INDEPENDENT_AMBULATORY_CARE_PROVIDER_SITE_OTHER): Payer: Medicare Other | Admitting: Cardiothoracic Surgery

## 2013-12-07 ENCOUNTER — Other Ambulatory Visit: Payer: Self-pay | Admitting: *Deleted

## 2013-12-07 ENCOUNTER — Encounter: Payer: Self-pay | Admitting: Cardiothoracic Surgery

## 2013-12-07 ENCOUNTER — Ambulatory Visit (HOSPITAL_COMMUNITY)
Admission: RE | Admit: 2013-12-07 | Discharge: 2013-12-07 | Disposition: A | Discharge status: Home / Self Care | Payer: Medicare Other | Source: Ambulatory Visit | Attending: Cardiothoracic Surgery | Admitting: Cardiothoracic Surgery

## 2013-12-07 VITALS — BP 118/71 | HR 104 | Resp 20 | Ht 69.0 in | Wt 232.0 lb

## 2013-12-07 DIAGNOSIS — C3490 Malignant neoplasm of unspecified part of unspecified bronchus or lung: Secondary | ICD-10-CM

## 2013-12-07 DIAGNOSIS — R222 Localized swelling, mass and lump, trunk: Secondary | ICD-10-CM

## 2013-12-07 DIAGNOSIS — R918 Other nonspecific abnormal finding of lung field: Secondary | ICD-10-CM

## 2013-12-07 DIAGNOSIS — Z1231 Encounter for screening mammogram for malignant neoplasm of breast: Secondary | ICD-10-CM | POA: Insufficient documentation

## 2013-12-07 NOTE — Patient Instructions (Signed)
No asa or Mobic- until after lung biopsy

## 2013-12-09 NOTE — Progress Notes (Signed)
[address]       [place] [address]             [phone]                    [facility] Record #[id number] Date of Birth: 1946/06/16  Referring: Dr Inda Merlin Primary Care: [name], MD  Chief Complaint:   Left lung mass  History of Present Illness:    [name] 68 y.o. female is seen in the office  today for left upper lobe lung mass hypermetabolic on recent PET scan. The patient has been on home oxygen for approximately 7 years, today in the office she dropped her sats to 84 while off oxygen walking. She is referred for consideration of surgical resection, so far there is no tissue diagnosis of the left upper lobe lesion. Reports from CT scan done in 2009 with presumed COPD exacerbation hospitalized in [facility] hospital showed a 4 mm left upper lobe nodule, unclear if this is the same nodule. Her medical history significant for HIV, depression, osteoporosis, dyslipidemia, adrenal insufficiency, COPD and long history of smoking. She presented to the emergency Department at [facility] complaining of a one-month history of chest congestion, shortness of breath as well as dry cough. Chest x-ray on admission 11/03/2013 showed no acute infiltrate or pulmonary edema. This was followed by CT angiogram of the chest on 11/03/2013 and it showed no evidence for pulmonary embolus but there is a spiculated density mass in the inferior lingula highly suspicious for bronchogenic carcinoma. The patient was treated with nebulizer and steroids and had improvement in her condition. She is currently on home oxygen.   she was referred to me for evaluation and recommendation regarding the new finding in her CT scan of the chest.  She has no significant weight loss or night sweats. She has occasional headache but no visual changes.    Family history significant for a mother and father are alcoholics. Her sister had pancreatic cancer and lung  cancer and brother had pancreatic cancer.  The patient is single and has no children. She lives by herself and [place]. She has a lot of family members around including her brother who was the main caregiver.  She has a history of smoking one pack per day for around 45 years and unfortunately she continues to smoke 2 cigarettes every day. She also has a history of alcohol abuse in the past but not recently and no history of drug abuse.     She is followed by Dr. Megan Salon for  HIV. HIV that has been perfectly controlled on Atripla . Her most recent viral load shows her to be essentially undetectable with a healthy CD4 count above 900.   Patient returns to the office following performance of PFTs and mammogram. The mammogram results are still pending. PFTs showed an FEV1 of 1.46% of predicte and diffusion capacity 9.1 29% of predicted. The patient comes into the office today and on presentation on room air her O2 sats were 77, she had forgotten to bring her oxygen with her. This returned to low 90s after putting her on oxygen in the office.  Current Activity/ Functional Status:  Patient is independent with mobility/ambulation, transfers, ADL's, IADL's.   Zubrod Score: At the time of surgery this patient's most appropriate activity status/level should be described as: []     0    Normal activity, no symptoms []   1    Restricted in physical strenuous activity but ambulatory, able to do out light work [x]     2    Ambulatory and capable of self care, unable to do work activities, up and about               >50 % of waking hours                              []     3    Only limited self care, in bed greater than 50% of waking hours []     4    Completely disabled, no self care, confined to bed or chair []     5    Moribund   Past Medical History  Diagnosis Date  . COPD (chronic obstructive pulmonary disease)   . Hyperlipidemia   . HIV (human immunodeficiency virus infection)   . Asthma      Past Surgical History  Procedure Laterality Date  . Abcess drainage      abcess under Left arm, abcess from L breast  . Thyroid gland removed    . Removal of abnormal cells on uterus      Family History  Problem Relation Age of Onset  . Pneumonia Mother   . Cancer Sister   . Hypertension Sister   . Diabetes Sister       History  Smoking status  . Current Some Day Smoker -- 0.10 packs/day for 40 years  . Types: Cigarettes  Smokeless tobacco  . Never Used    Comment: 3 cigs daily 11/27/13    History  Alcohol Use No    Comment: none in 20 years     No Known Allergies  Current Outpatient Prescriptions  Medication Sig Dispense Refill  . albuterol (PROVENTIL HFA;VENTOLIN HFA) 108 (90 BASE) MCG/ACT inhaler Inhale into the lungs 2 (two) times daily.      Marland Kitchen albuterol (PROVENTIL) (2.5 MG/3ML) 0.083% nebulizer solution Take 3 mLs (2.5 mg total) by nebulization every 4 (four) hours as needed for wheezing or shortness of breath.  75 mL  5  . aspirin EC 81 MG tablet Take 81 mg by mouth daily.      . ATRIPLA 600-200-300 MG per tablet TAKE 1 TABLET BY MOUTH EVERY NIGHT AT BEDTIME *APPOINTMENT REQUIRED FOR REFILLS*  30 tablet  0  . Fluticasone-Salmeterol (ADVAIR) 500-50 MCG/DOSE AEPB Inhale 1 puff into the lungs every 12 (twelve) hours.      . gabapentin (NEURONTIN) 300 MG capsule Take 300 mg by mouth 3 (three) times daily.      Marland Kitchen loratadine (CLARITIN) 10 MG tablet Take 10 mg by mouth daily.      . meloxicam (MOBIC) 15 MG tablet Take 15 mg by mouth daily.      . montelukast (SINGULAIR) 10 MG tablet Take 1 tablet (10 mg total) by mouth at bedtime.  30 tablet  6  . nicotine (NICODERM CQ - DOSED IN MG/24 HOURS) 21 mg/24hr patch Place 1 patch (21 mg total) onto the skin daily.  28 patch  0  . oxyCODONE-acetaminophen (PERCOCET/ROXICET) 5-325 MG per tablet Take 1 tablet by mouth every 4 (four) hours as needed.  30 tablet  0  . Tiotropium Bromide Monohydrate (SPIRIVA RESPIMAT) 2.5 MCG/ACT  AERS Inhale 2 puffs into the lungs daily.  4 g  0  . venlafaxine XR (EFFEXOR-XR) 150 MG 24 hr capsule Take 150 mg by mouth  daily.      . vitamin B-12 (CYANOCOBALAMIN) 250 MCG tablet Take 250 mcg by mouth daily.       No current facility-administered medications for this visit.     Review of Systems:     Cardiac Review of Systems: Y or N  Chest Pain [  n  ]  Resting SOB Blue.Reese ] Exertional SOB  Blue.Reese  ]  Orthopnea [ n ]   Pedal Edema [ y  ]    Palpitations [ n ] Syncope  Blue.Reese  ]   Presyncope Florencio.Farrier   ]  General Review of Systems: [Y] = yes [  ]=no Constitional: recent weight change [ n ];  Wt loss over the last 3 months [   ] anorexia [  ]; fatigue [  ]; nausea [  ]; night sweats [  ]; fever [  ]; or chills [  ];          Dental: poor dentition[ y dentures ]; Last Dentist visit:   Eye : blurred vision [  ]; diplopia [   ]; vision changes [  ];  Amaurosis fugax[  ]; Resp: cough Blue.Reese  ];  wheezing[ y ];  hemoptysis[n  ]; shortness of breath[ y ]; paroxysmal nocturnal dyspnea[ y ]; dyspnea on exertion[ y ]; or orthopnea[y  ];  GI:  gallstones[  ], vomiting[  ];  dysphagia[  ]; melena[  ];  hematochezia [  ]; heartburn[  ];   Hx of  Colonoscopy[  ]; GU: kidney stones [  ]; hematuria[  ];   dysuria [  ];  nocturia[  ];  history of     obstruction [  ]; urinary frequency [  ]             Skin: rash, swelling[  ];, hair loss[  ];  peripheral edema[  ];  or itching[  ]; Musculosketetal: myalgias[y  ];  joint swelling[ y ];  joint erythema[  ];  joint pain[  ];  back pain[  ];  Heme/Lymph: bruising[  ];  bleeding[  ];  anemia[  ];  Neuro: TIA[  ];  headaches[  ];  stroke[  ];  vertigo[  ];  seizures[n  ];   paresthesias[n  ];  difficulty walking[y  ];  Psych:depression[  ]; anxiety[  ];  Endocrine: diabetes[ n ];  thyroid dysfunction[  ];  Immunizations: Flu up to date [  ]; Pneumococcal up to date [  ];  Other:  Physical Exam: BP 118/71  Pulse 104  Resp 20  Ht 5\' 9"  (1.753 m)  Wt 232 lb (105.235 kg)  BMI  34.24 kg/m2  SpO2 96%  PHYSICAL EXAMINATION:  General appearance: alert, cooperative, appears older than stated age, fatigued and no distress Neurologic: intact Heart: regular rate and rhythm, S1, S2 normal, no murmur, click, rub or gallop Lungs: diminished breath sounds bilaterally Abdomen: soft, non-tender; bowel sounds normal; no masses,  no organomegaly Extremities: extremities normal, atraumatic, no cyanosis or edema and Homans sign is negative, no sign of DVT Patient has no carotid bruits, no cervical or supraclavicular or axillary adenopathy. She is on oxygen at the time of exam  Diagnostic Studies & Laboratory data:     Recent Radiology Findings:  Mammogram results still pending  Dg Chest 2 View (if Patient Has Fever And/or Copd)  10/30/2013   CLINICAL DATA:  Shortness of breath, cough, right-sided chest pain  EXAM: CHEST  2 VIEW  COMPARISON:  07/03/2013  FINDINGS: Chronic interstitial markings/bronchitic changes. Mild chronic linear/patchy opacity in the left upper lobe and lingula, similar to 2013, likely scarring.  No focal consolidation.  No pleural effusion or pneumothorax.  The heart is normal in size.  Mild degenerative changes of the visualized thoracolumbar spine.  IMPRESSION: No evidence of acute cardiopulmonary disease.  Chronic bronchitic changes with left upper lobe/lingular scarring.   Electronically Signed   By: Julian Hy M.D.   On: 10/30/2013 15:58   Ct Angio Chest Pe W/cm &/or Wo Cm  11/03/2013   CLINICAL DATA:  Shortness of breath, chest pain, history of COPD  EXAM: CT ANGIOGRAPHY CHEST WITH CONTRAST  TECHNIQUE: Multidetector CT imaging of the chest was performed using the standard protocol during bolus administration of intravenous contrast. Multiplanar CT image reconstructions and MIPs were obtained to evaluate the vascular anatomy.  CONTRAST:  148mL OMNIPAQUE IOHEXOL 350 MG/ML SOLN  COMPARISON:  None.  FINDINGS: This study is mildly to moderately limited for  combination of factors including suboptimal opacification of the pulmonary arterial system (despite repeating the study) hand respiratory motion artifact. Allowing for these limitations, there are no pulmonary arterial emboli identified.  There is calcification of the aorta and there is moderate plaque within the lumen of the descending thoracic aorta focally in its midsection. There is no hilar or mediastinal adenopathy. There are no pleural or pericardial effusions.  Scans through the upper abdomen are unremarkable. There are no acute musculoskeletal findings.  There is mild perihilar peribronchial wall thickening. There is mild interstitial change at both lung bases with mild atelectatic change at both lung bases is well. There is no consolidation or effusion. In the inferior lingula there is a spiculated 2.3 cm mass.  Review of the MIP images confirms the above findings.  IMPRESSION: 1. No evidence of pulmonary embolism although the study is limited 2. Mild chronic bronchitic change 3. Mass in the inferior lingula highly suspicious for bronchogenic carcinoma. These results will be called to the ordering clinician or representative by the Radiologist Assistant, and communication documented in the PACS Dashboard.   Electronically Signed   By: Skipper Cliche M.D.   On: 11/03/2013 16:46   Mr Jeri Cos ZY Contrast  11/22/2013   CLINICAL DATA:  Non-small cell lung cancer.  EXAM: MRI HEAD WITHOUT AND WITH CONTRAST  TECHNIQUE: Multiplanar, multiecho pulse sequences of the brain and surrounding structures were obtained without and with intravenous contrast.  CONTRAST:  30mL MULTIHANCE GADOBENATE DIMEGLUMINE 529 MG/ML IV SOLN  COMPARISON:  MRI brain 09/12/2007  FINDINGS: No acute infarct, hemorrhage, or mass lesion is present.  The postcontrast images demonstrate no evidence for pathologic enhancement. The ventricles are of normal size. No significant extra-axial fluid collection is present. Scattered subcortical T2  hyperintensities are slightly exaggerated for age.  Flow is present in the major intracranial arteries. The globes and orbits are intact. Minimal mucosal thickening is present in the right maxillary sinus. The remaining paranasal sinuses and mastoid air cells are clear.  Midline structures demonstrate some heterogeneity of marrow signal within the upper cervical spine. Osseous metastases are not excluded. However, there were no bone metastases in the PET scan of the same day. The signal change is likely degenerative.  IMPRESSION: 1. No evidence for metastatic disease to the brain. 2. Marrow signal heterogeneity in the upper cervical spine is likely degenerative without evidence for osseous metastases on the PET scan. 3. Scattered subcortical T2 hyperintensities are present bilaterally. These likely reflect the  sequelae of chronic microvascular ischemia.   Electronically Signed   By: Lawrence Santiago M.D.   On: 11/22/2013 17:15   Nm Pet Image Initial (pi) Skull Base To Thigh  11/22/2013   CLINICAL DATA:  Initial treatment strategy for solitary left lung nodule.  EXAM: NUCLEAR MEDICINE PET SKULL BASE TO THIGH  TECHNIQUE: 11.8 mCi F-18 FDG was injected intravenously. Full-ring PET imaging was performed from the skull base to thigh after the radiotracer. CT data was obtained and used for attenuation correction and anatomic localization.  FASTING BLOOD GLUCOSE:  Value: 101 mg/dl  COMPARISON:  Chest CTA on 11/03/2013  FINDINGS: NECK  No hypermetabolic lymph nodes in the neck.  CHEST  No hypermetabolic mediastinal or hilar nodes. 2.2 cm spiculated nodule in the posterior left upper lobe is hypermetabolic, with SUV max of 11.3, consistent with primary bronchogenic carcinoma. No other suspicious pulmonary nodules identified.  Tiny less than 1 cm soft tissue nodule in the right superior breast has maximum SUV of 2.4, and a similar less than 1 cm soft tissue nodule in the superior left breast has a maximum SUV of 1.0. Less  than 1 cm axillary lymph nodes are also seen bilaterally with low-grade metabolic activity, with SUV max on the right of 1.5 and on the left of 1.3. These findings are of uncertain clinical significance.  ABDOMEN/PELVIS  No abnormal hypermetabolic activity within the liver, pancreas, adrenal glands, or spleen. No hypermetabolic lymph nodes in the abdomen or pelvis.  SKELETON  No focal hypermetabolic activity to suggest skeletal metastasis.  IMPRESSION: Hypermetabolic 2.2 cm spiculated nodule in the posterior left upper lobe, consistent with primary bronchogenic carcinoma. No evidence nodal or distant metastatic disease.  Tiny less than 1 cm bilateral breast nodules and axillary lymph nodes show low-grade metabolic activity, which is nonspecific. These findings are of uncertain clinical significance. Correlation with diagnostic mammography recommended for further evaluation.   Electronically Signed   By: Earle Gell M.D.   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 The patient is currently on oxygen and just discharged from the hospital 3 weeks ago for COPD exacerbation.- full pulmonary function- severe limitation with significant desaturations when not on oxygen and with activity.  Because of PET findings in the breast  she's had no mammogram for the last 8 years, mammogram has been done results  are still pending  her pulmonary function studies are extremely poor, she would be a very poor surgical candidate for resection . I discussed this with her and we'll make arrangements for needle biopsy of the left upper lobe lesion and consider stereotactic radiotherapy with the results of the mammogram are available this may be need to be addressed also .  Following the  results of the needle biopsy she will be seen in [facility] clinic for consideration of further treatment , nonoperative .     Diagnosis Date Noted  . HIV DISEASE 12/09/2006  . DEPRESSION 12/09/2006  . BREAST MASS, BENIGN 12/09/2006  . OSTEOPOROSIS 12/09/2006  . ALLERGIC RHINITIS, SEASONAL 07/03/2010  . COPD 07/03/2010  . Hyperlipidemia 12/11/2010  . Hypertension 12/11/2010  . Asthma 12/10/2011  . Cigarette smoker 12/10/2011  . Obesity 05/05/2012  . Renal insufficiency 05/05/2012  . Hyperglycemia 05/05/2012  . Rectal bleed 11/19/2012  . Other and unspecified noninfectious gastroenteritis and colitis(558.9) 11/20/2012  . COPD with acute exacerbation 10/30/2013  . COPD exacerbation 10/30/2013  . Acute respiratory failure 10/31/2013      Grace Isaac MD      [address] [place] [address] Office [phone]   Beeper [phone]  12/09/2013 6:43 AM

## 2013-12-11 ENCOUNTER — Other Ambulatory Visit: Payer: Self-pay | Admitting: Cardiothoracic Surgery

## 2013-12-11 ENCOUNTER — Encounter (HOSPITAL_COMMUNITY): Payer: Self-pay | Admitting: Pharmacy Technician

## 2013-12-11 DIAGNOSIS — R928 Other abnormal and inconclusive findings on diagnostic imaging of breast: Secondary | ICD-10-CM

## 2013-12-11 LAB — PULMONARY FUNCTION TEST
DL/VA % pred: 40 %
DL/VA: 2.16 ml/min/mmHg/L
DLCO UNC: 9.14 ml/min/mmHg
DLCO cor % pred: 29 %
DLCO cor: 9 ml/min/mmHg
DLCO unc % pred: 29 %
FEF 25-75 PRE: 0.45 L/s
FEF 25-75 Post: 0.64 L/sec
FEF2575-%Change-Post: 40 %
FEF2575-%Pred-Post: 28 %
FEF2575-%Pred-Pre: 20 %
FEV1-%Change-Post: 11 %
FEV1-%Pred-Post: 51 %
FEV1-%Pred-Pre: 46 %
FEV1-Post: 1.24 L
FEV1-Pre: 1.11 L
FEV1FVC-%CHANGE-POST: 3 %
FEV1FVC-%Pred-Pre: 71 %
FEV6-%CHANGE-POST: 7 %
FEV6-%PRED-POST: 70 %
FEV6-%PRED-PRE: 65 %
FEV6-PRE: 1.96 L
FEV6-Post: 2.1 L
FEV6FVC-%Change-Post: 0 %
FEV6FVC-%PRED-POST: 100 %
FEV6FVC-%Pred-Pre: 101 %
FVC-%Change-Post: 7 %
FVC-%PRED-POST: 69 %
FVC-%Pred-Pre: 64 %
FVC-POST: 2.15 L
FVC-PRE: 1.99 L
POST FEV6/FVC RATIO: 98 %
Post FEV1/FVC ratio: 58 %
Pre FEV1/FVC ratio: 56 %
Pre FEV6/FVC Ratio: 98 %
RV % pred: 108 %
RV: 2.56 L
TLC % PRED: 81 %
TLC: 4.75 L

## 2013-12-12 ENCOUNTER — Telehealth: Payer: Self-pay | Admitting: *Deleted

## 2013-12-12 ENCOUNTER — Other Ambulatory Visit: Payer: Self-pay | Admitting: Radiology

## 2013-12-12 NOTE — Telephone Encounter (Signed)
Called left vm message regarding appt for [facility] 12/21/13.  I left my name and phone number to call back with more information about appt.

## 2013-12-14 ENCOUNTER — Ambulatory Visit (HOSPITAL_COMMUNITY)
Admission: RE | Admit: 2013-12-14 | Discharge: 2013-12-14 | Disposition: A | Discharge status: Home / Self Care | Payer: Medicare Other | Source: Ambulatory Visit | Attending: Interventional Radiology | Admitting: Interventional Radiology

## 2013-12-14 ENCOUNTER — Other Ambulatory Visit: Payer: Self-pay | Admitting: Medical Oncology

## 2013-12-14 ENCOUNTER — Telehealth: Payer: Self-pay | Admitting: Pulmonary Disease

## 2013-12-14 ENCOUNTER — Ambulatory Visit (HOSPITAL_COMMUNITY)
Admission: RE | Admit: 2013-12-14 | Discharge: 2013-12-14 | Disposition: A | Discharge status: Home / Self Care | Payer: Medicare Other | Source: Ambulatory Visit | Attending: Cardiothoracic Surgery | Admitting: Cardiothoracic Surgery

## 2013-12-14 ENCOUNTER — Encounter (HOSPITAL_COMMUNITY): Payer: Self-pay

## 2013-12-14 VITALS — BP 133/67 | HR 88 | Temp 98.5°F | Resp 18 | Ht 69.0 in | Wt 232.0 lb

## 2013-12-14 DIAGNOSIS — C3490 Malignant neoplasm of unspecified part of unspecified bronchus or lung: Secondary | ICD-10-CM

## 2013-12-14 DIAGNOSIS — R911 Solitary pulmonary nodule: Secondary | ICD-10-CM | POA: Diagnosis present

## 2013-12-14 DIAGNOSIS — R079 Chest pain, unspecified: Secondary | ICD-10-CM | POA: Diagnosis not present

## 2013-12-14 DIAGNOSIS — R918 Other nonspecific abnormal finding of lung field: Secondary | ICD-10-CM

## 2013-12-14 HISTORY — DX: Malignant neoplasm of unspecified part of unspecified bronchus or lung: C34.90

## 2013-12-14 LAB — CBC
HCT: 45.4 % (ref 36.0–46.0)
Hemoglobin: 13.9 g/dL (ref 12.0–15.0)
MCH: 28.4 pg (ref 26.0–34.0)
MCHC: 30.6 g/dL (ref 30.0–36.0)
MCV: 92.8 fL (ref 78.0–100.0)
PLATELETS: 195 10*3/uL (ref 150–400)
RBC: 4.89 MIL/uL (ref 3.87–5.11)
RDW: 16.2 % — ABNORMAL HIGH (ref 11.5–15.5)
WBC: 9.3 10*3/uL (ref 4.0–10.5)

## 2013-12-14 LAB — PROTIME-INR
INR: 0.93 (ref 0.00–1.49)
Prothrombin Time: 12.3 seconds (ref 11.6–15.2)

## 2013-12-14 LAB — APTT: APTT: 27 s (ref 24–37)

## 2013-12-14 MED ORDER — MIDAZOLAM HCL 2 MG/2ML IJ SOLN
INTRAMUSCULAR | Status: AC | PRN
  Administered 2013-12-14: 2 mg via INTRAVENOUS

## 2013-12-14 MED ORDER — MIDAZOLAM HCL 2 MG/2ML IJ SOLN
INTRAMUSCULAR | Status: AC
  Filled 2013-12-14: qty 4

## 2013-12-14 MED ORDER — HYDROCODONE-ACETAMINOPHEN 5-325 MG PO TABS
1.0000 | ORAL_TABLET | ORAL | Status: DC | PRN
  Administered 2013-12-14: 2 via ORAL
  Filled 2013-12-14 (×2): qty 2

## 2013-12-14 MED ORDER — LIDOCAINE HCL 1 % IJ SOLN
INTRAMUSCULAR | Status: DC
Start: 2013-12-14 — End: 2013-12-15
  Filled 2013-12-14: qty 10

## 2013-12-14 MED ORDER — SODIUM CHLORIDE 0.9 % IV SOLN: INTRAVENOUS | Status: DC

## 2013-12-14 MED ORDER — FENTANYL CITRATE 0.05 MG/ML IJ SOLN
INTRAMUSCULAR | Status: AC
  Filled 2013-12-14: qty 4

## 2013-12-14 MED ORDER — FENTANYL CITRATE 0.05 MG/ML IJ SOLN
INTRAMUSCULAR | Status: AC | PRN
  Administered 2013-12-14: 50 ug via INTRAVENOUS

## 2013-12-14 NOTE — H&P (Signed)
[name] is an 68 y.o. female.   Chief Complaint: pt presented to ED with cough; chest pain; shortness of breath CXR 10/30/2013 neg Continued SOB and returned for additional studies CTA 11/03/13 revealed L lung mass +PET 5/27: Left lung mass Referred to Dr Servando Snare: needs tissue diagnosis Not best surgical candidate secondary to poor pulmonary function Uses 4L 02 at home. Smoker; + HIV Scheduled now for left lung mass biopsy  HPI: COPD; HIV; HLD; smoker  Past Medical History  Diagnosis Date  . COPD (chronic obstructive pulmonary disease)   . Hyperlipidemia   . HIV (human immunodeficiency virus infection)   . Asthma     Past Surgical History  Procedure Laterality Date  . Abcess drainage      abcess under Left arm, abcess from L breast  . Thyroid gland removed    . Removal of abnormal cells on uterus      Family History  Problem Relation Age of Onset  . Pneumonia Mother   . Cancer Sister   . Hypertension Sister   . Diabetes Sister    Social History:  reports that she has been smoking Cigarettes.  She has a 4 pack-year smoking history. She has never used smokeless tobacco. She reports that she does not drink alcohol or use illicit drugs.  Allergies: No Known Allergies   (Not in a hospital admission)  Results for orders placed during the hospital encounter of 12/14/13 (from the past 48 hour(s))  APTT     Status: None   Collection Time    12/14/13  7:24 AM      Result Value Ref Range   aPTT 27  24 - 37 seconds  CBC     Status: Abnormal   Collection Time    12/14/13  7:24 AM      Result Value Ref Range   WBC 9.3  4.0 - 10.5 K/uL   RBC 4.89  3.87 - 5.11 MIL/uL   Hemoglobin 13.9  12.0 - 15.0 g/dL   HCT 45.4  36.0 - 46.0 %   MCV 92.8  78.0 - 100.0 fL   MCH 28.4  26.0 - 34.0 pg   MCHC 30.6  30.0 - 36.0 g/dL   RDW 16.2 (*) 11.5 - 15.5 %   Platelets 195  150 - 400 K/uL  PROTIME-INR     Status: None   Collection Time    12/14/13  7:24 AM      Result Value Ref  Range   Prothrombin Time 12.3  11.6 - 15.2 seconds   INR 0.93  0.00 - 1.49   No results found.  Review of Systems  Constitutional: Negative for fever and weight loss.  Respiratory: Positive for cough, sputum production and shortness of breath.   Cardiovascular: Positive for chest pain.  Gastrointestinal: Negative for nausea, vomiting and abdominal pain.  Neurological: Positive for weakness. Negative for dizziness and headaches.  Psychiatric/Behavioral: Positive for substance abuse.       Smoker    Blood pressure 138/75, pulse 87, temperature 98.6 F (37 C), temperature source Oral, resp. rate 18, height 5\' 9"  (1.753 m), weight 105.235 kg (232 lb), SpO2 94.00%. Physical Exam  Constitutional: She is oriented to person, place, and time. She appears well-nourished.  Cardiovascular: Normal rate, regular rhythm and normal heart sounds.   No murmur heard. Respiratory: Effort normal and breath sounds normal. She has no wheezes.  GI: Soft. Bowel sounds are normal. There is no tenderness.  Musculoskeletal: Normal range  of motion.  Neurological: She is alert and oriented to person, place, and time.  Skin: Skin is warm and dry.  Psychiatric: She has a normal mood and affect. Her behavior is normal. Judgment and thought content normal.     Assessment/Plan Left lung mass in smoker +PET Scheduled for biopsy now Pt aware of procedure benefits and risks and agreeable to proceed Consent signed and in chart  TURPIN,PAMELA A 12/14/2013, 8:12 AM

## 2013-12-14 NOTE — Telephone Encounter (Signed)
Called spoke with pt. Made her aware refill for this medication needs to come from PCP or whoever has been filling this for her. Nothing further needed

## 2013-12-14 NOTE — Procedures (Signed)
Interventional Radiology Procedure Note  Procedure: CT guided biopsy of LUL pulmonary nodule Complications: No immediate Recommendations: - Bedrest until CXR cleared.  Minimize talking, coughing or otherwise straining.  - Follow up 2 hr CXR pending   Signed,  Criselda Peaches, MD [facility]

## 2013-12-14 NOTE — Discharge Instructions (Signed)
Needle Biopsy of Lung, Care After Refer to this sheet in the next few weeks. These instructions provide you with information on caring for yourself after your procedure. Your health care provider may also give you more specific instructions. Your treatment has been planned according to current medical practices, but problems sometimes occur. Call your health care provider if you have any problems or questions after your procedure. WHAT TO EXPECT AFTER THE PROCEDURE A bandage will be applied over the area where the needle was inserted. You may be asked to apply pressure to the bandage for several minutes to ensure there is minimal bleeding. In most cases, you can leave when your needle biopsy procedure is completed. Do not drive yourself home. Someone else should take you home. If you received an IV sedative or general anesthetic, you will be taken to a comfortable place to relax while the medication wears off. If you have upcoming travel scheduled, talk to your doctor about when it is safe to travel by air after the procedure. HOME CARE INSTRUCTIONS Expect to take it easy for the rest of the day. Protect the area where you received the needle biopsy by keeping the bandage in place for as long as instructed. You may feel some mild pain or discomfort in the area, but this should stop in a day or two. Only take over-the-counter or prescription medicines for pain, discomfort, or fever as directed by your health care provider. SEEK MEDICAL CARE IF:   You have pain at the biopsy site that worsens or is not helped by medication.  You have swelling or drainage at the needle biopsy site.  You have a fever. SEEK IMMEDIATE MEDICAL CARE IF:   You have new or worsening shortness of breath.  You have chest pain.  You are coughing up blood.  You have bleeding that does not stop with pressure or a bandage.  You develop light-headedness or fainting. MAKE SURE YOU:  Understand these instructions.  Will  watch your condition.  Will get help right away if you are not doing well or get worse. Document Released: 04/12/2007 Document Revised: 06/20/2013 Document Reviewed: 11/07/2012 [facility] Patient Information 2015 [place], [place]. This information is not intended to replace advice given to you by your health care provider. Make sure you discuss any questions you have with your health care provider.

## 2013-12-15 ENCOUNTER — Other Ambulatory Visit: Payer: Self-pay | Admitting: Medical Oncology

## 2013-12-15 ENCOUNTER — Telehealth: Payer: Self-pay | Admitting: *Deleted

## 2013-12-15 MED ORDER — OXYCODONE-ACETAMINOPHEN 5-325 MG PO TABS: 1.0000 | ORAL_TABLET | Freq: Four times a day (QID) | ORAL | Status: DC | PRN

## 2013-12-15 NOTE — Telephone Encounter (Signed)
Called pt with appt.  Appt changed per pt request.  Appt now 12/28/13 at 9:00labs and 9:15 with Dr. [name]. Pt verbalized understanding of appt time and place.

## 2013-12-15 NOTE — Telephone Encounter (Signed)
rx refilled per Dr [name] and locked in injection room. Pt notified.

## 2013-12-20 ENCOUNTER — Other Ambulatory Visit: Payer: Medicare Other

## 2013-12-27 ENCOUNTER — Ambulatory Visit (INDEPENDENT_AMBULATORY_CARE_PROVIDER_SITE_OTHER): Payer: Medicare Other | Admitting: Pulmonary Disease

## 2013-12-27 ENCOUNTER — Other Ambulatory Visit: Payer: Self-pay | Admitting: *Deleted

## 2013-12-27 ENCOUNTER — Encounter (HOSPITAL_COMMUNITY): Payer: Medicare Other

## 2013-12-27 ENCOUNTER — Encounter: Payer: Self-pay | Admitting: Pulmonary Disease

## 2013-12-27 VITALS — BP 144/78 | HR 86 | Temp 97.4°F | Ht 69.0 in | Wt 239.5 lb

## 2013-12-27 DIAGNOSIS — J449 Chronic obstructive pulmonary disease, unspecified: Secondary | ICD-10-CM

## 2013-12-27 DIAGNOSIS — C3430 Malignant neoplasm of lower lobe, unspecified bronchus or lung: Secondary | ICD-10-CM

## 2013-12-27 NOTE — Assessment & Plan Note (Signed)
Refills on spiriva Stay on advair You have to QUIT smoking!! You will qualify for pulmonary rehab

## 2013-12-27 NOTE — Patient Instructions (Signed)
Refills on spiriva Stay on advair You have to QUIT smoking!! You will qualify for pulmonary rehab  You will likely need radiation treatment Sleep study in the future

## 2013-12-27 NOTE — Progress Notes (Signed)
   Subjective:    Patient ID: [name], female    DOB: 1945-08-22, 68 y.o.   MRN: [id number]  HPI  68 y/o F, for followup of COPD and lung cancer  PMH of HIV-11/01/13 - Absolute CD4 990, HIV RNA 27    CT chest showed Lingular spiculated 2.3 cm nodule -PET - pos, Tiny less than 1 cm bilateral breast nodules and axillary lymph nodes showed low-grade metabolic activity, nonspecific - seen by TCTS MRI brain neg  CT scan done in 2009 with presumed COPD exacerbation hospitalized in [facility] hospital showed a 4 mm left upper lobe nodule, unclear if this is the same nodule.   Chief Complaint  Patient presents with  . Follow-up    Pt reports breathing unchanged. Denies wheezing/chest tx, slight cough. pt had PFT 12/06/13. pt entered exam room 85% ra. placed on 2 l/m recovered to 91%    PFTS- UEK80.03 -51%, ratio 56, FVC 2.15-69%, DLCO 9.1 -29% Mammogram -possible mass in the left breast. CT guided biopsy - primary lung adeno CA She is very anxious about breast biopsy results Denies wheezing or cough, continues to smoke  Review of Systems neg for any significant sore throat, dysphagia, itching, sneezing, nasal congestion or excess/ purulent secretions, fever, chills, sweats, unintended wt loss, pleuritic or exertional cp, hempoptysis, orthopnea pnd or change in chronic leg swelling. Also denies presyncope, palpitations, heartburn, abdominal pain, nausea, vomiting, diarrhea or change in bowel or urinary habits, dysuria,hematuria, rash, arthralgias, visual complaints, headache, numbness weakness or ataxia.     Objective:   Physical Exam  Gen. Pleasant, obese, in no distress, normal affect ENT - no lesions, no post nasal drip, class 2-3 airway Neck: No JVD, no thyromegaly, no carotid bruits Lungs: no use of accessory muscles, no dullness to percussion, decreased without rales or rhonchi  Cardiovascular: Rhythm regular, heart sounds  normal, no murmurs or gallops, no peripheral  edema Abdomen: soft and non-tender, no hepatosplenomegaly, BS normal. Musculoskeletal: No deformities, no cyanosis or clubbing Neuro:  alert, non focal, no tremors       Assessment & Plan:

## 2013-12-27 NOTE — Assessment & Plan Note (Signed)
You will likely need radiation treatment

## 2013-12-28 ENCOUNTER — Encounter: Payer: Self-pay | Admitting: Internal Medicine

## 2013-12-28 ENCOUNTER — Other Ambulatory Visit (HOSPITAL_BASED_OUTPATIENT_CLINIC_OR_DEPARTMENT_OTHER): Payer: Medicare Other

## 2013-12-28 ENCOUNTER — Ambulatory Visit (HOSPITAL_BASED_OUTPATIENT_CLINIC_OR_DEPARTMENT_OTHER): Payer: Medicare Other | Admitting: Internal Medicine

## 2013-12-28 ENCOUNTER — Telehealth: Payer: Self-pay | Admitting: Internal Medicine

## 2013-12-28 ENCOUNTER — Telehealth: Payer: Self-pay | Admitting: *Deleted

## 2013-12-28 VITALS — BP 158/79 | HR 105 | Temp 98.5°F | Resp 19 | Ht 69.0 in | Wt 238.1 lb

## 2013-12-28 DIAGNOSIS — C3412 Malignant neoplasm of upper lobe, left bronchus or lung: Secondary | ICD-10-CM

## 2013-12-28 DIAGNOSIS — J449 Chronic obstructive pulmonary disease, unspecified: Secondary | ICD-10-CM

## 2013-12-28 DIAGNOSIS — C3410 Malignant neoplasm of upper lobe, unspecified bronchus or lung: Secondary | ICD-10-CM

## 2013-12-28 DIAGNOSIS — B20 Human immunodeficiency virus [HIV] disease: Secondary | ICD-10-CM

## 2013-12-28 DIAGNOSIS — J4489 Other specified chronic obstructive pulmonary disease: Secondary | ICD-10-CM

## 2013-12-28 DIAGNOSIS — C3430 Malignant neoplasm of lower lobe, unspecified bronchus or lung: Secondary | ICD-10-CM

## 2013-12-28 LAB — COMPREHENSIVE METABOLIC PANEL (CC13)
ALBUMIN: 3.8 g/dL (ref 3.5–5.0)
ALT: 16 U/L (ref 0–55)
AST: 17 U/L (ref 5–34)
Alkaline Phosphatase: 76 U/L (ref 40–150)
Anion Gap: 11 mEq/L (ref 3–11)
BILIRUBIN TOTAL: 0.24 mg/dL (ref 0.20–1.20)
BUN: 14.5 mg/dL (ref 7.0–26.0)
CALCIUM: 9.2 mg/dL (ref 8.4–10.4)
CHLORIDE: 103 meq/L (ref 98–109)
CO2: 28 meq/L (ref 22–29)
Creatinine: 1.4 mg/dL — ABNORMAL HIGH (ref 0.6–1.1)
GLUCOSE: 148 mg/dL — AB (ref 70–140)
POTASSIUM: 3.8 meq/L (ref 3.5–5.1)
SODIUM: 143 meq/L (ref 136–145)
Total Protein: 7.9 g/dL (ref 6.4–8.3)

## 2013-12-28 LAB — CBC WITH DIFFERENTIAL/PLATELET
BASO%: 0.5 % (ref 0.0–2.0)
Basophils Absolute: 0 10*3/uL (ref 0.0–0.1)
EOS ABS: 0.1 10*3/uL (ref 0.0–0.5)
EOS%: 1.3 % (ref 0.0–7.0)
HCT: 47.6 % — ABNORMAL HIGH (ref 34.8–46.6)
HGB: 15 g/dL (ref 11.6–15.9)
LYMPH#: 3.5 10*3/uL — AB (ref 0.9–3.3)
LYMPH%: 43.2 % (ref 14.0–49.7)
MCH: 28.5 pg (ref 25.1–34.0)
MCHC: 31.5 g/dL (ref 31.5–36.0)
MCV: 90.5 fL (ref 79.5–101.0)
MONO#: 0.3 10*3/uL (ref 0.1–0.9)
MONO%: 4.1 % (ref 0.0–14.0)
NEUT#: 4.1 10*3/uL (ref 1.5–6.5)
NEUT%: 50.9 % (ref 38.4–76.8)
Platelets: 240 10*3/uL (ref 145–400)
RBC: 5.26 10*6/uL (ref 3.70–5.45)
RDW: 16.3 % — ABNORMAL HIGH (ref 11.2–14.5)
WBC: 8.1 10*3/uL (ref 3.9–10.3)

## 2013-12-28 NOTE — Telephone Encounter (Signed)
, °

## 2013-12-28 NOTE — Progress Notes (Signed)
[facility] [phone]   [phone]  OFFICE PROGRESS NOTE  [name], MD [address] 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  PRIOR THERAPY: None.  CURRENT THERAPY: She is expected to undergo stereotactic radiosurgery to the left upper lobe lung nodule by radiation oncology.  INTERVAL HISTORY: [name] 68 y.o. female returns to the clinic today for followup visit. The patient is feeling fine today with no specific complaints except for arthralgia. She was seen recently by Dr. Servando Snare for evaluation and consideration of surgical resection of the left upper lobe lung nodule but her pulmonary function were poor and the patient was not a surgical candidate for resection. Her recent PET scan showed suspicious nodules and the breast and axilla bilaterally. She underwent a mammogram and it was suspicious for breast nodules and the patient has a scheduled for repeat mammogram the ultrasound evaluation at the breast Center. She is here today for evaluation and discussion of treatment options for the pulmonary nodule. She denied having any significant chest pain but continues to have shortness of breath at baseline and increased with exertion with no cough or hemoptysis. She has no nausea or vomiting. No weight loss or night sweats.  MEDICAL HISTORY: Past Medical History  Diagnosis Date  . COPD (chronic obstructive pulmonary disease)   . Hyperlipidemia   . HIV (human immunodeficiency virus infection)   . Asthma     ALLERGIES:  has No Known Allergies.  MEDICATIONS:  Current Outpatient Prescriptions  Medication Sig Dispense Refill  . albuterol (PROVENTIL HFA;VENTOLIN HFA) 108 (90 BASE) MCG/ACT inhaler Inhale 1 puff into the lungs 2 (two) times daily.       Marland Kitchen albuterol (PROVENTIL) (2.5 MG/3ML) 0.083% nebulizer solution  Take 2.5 mg by nebulization 3 (three) times daily.      Marland Kitchen aspirin EC 81 MG tablet Take 81 mg by mouth daily.      . Calcium Carbonate-Vitamin D (CALCIUM 600+D) 600-400 MG-UNIT per tablet Take 1 tablet by mouth daily.      Marland Kitchen efavirenz-emtricitabine-tenofovir (ATRIPLA) 600-200-300 MG per tablet Take 1 tablet by mouth at bedtime.      . Fluticasone-Salmeterol (ADVAIR) 500-50 MCG/DOSE AEPB Inhale 1 puff into the lungs every 12 (twelve) hours.      . gabapentin (NEURONTIN) 300 MG capsule Take 300 mg by mouth 3 (three) times daily.      Marland Kitchen loratadine (CLARITIN) 10 MG tablet Take 10 mg by mouth daily.      . meloxicam (MOBIC) 15 MG tablet Take 15 mg by mouth daily.      . montelukast (SINGULAIR) 10 MG tablet Take 1 tablet (10 mg total) by mouth at bedtime.  30 tablet  6  . nicotine (NICODERM CQ - DOSED IN MG/24 HOURS) 21 mg/24hr patch Place 1 patch (21 mg total) onto the skin daily.  28 patch  0  . oxyCODONE-acetaminophen (PERCOCET/ROXICET) 5-325 MG per tablet Take 1 tablet by mouth every 6 (six) hours as needed for severe pain (pain).  30 tablet  0  . venlafaxine XR (EFFEXOR-XR) 150 MG 24 hr capsule Take 150 mg by mouth daily.      . vitamin B-12 (CYANOCOBALAMIN) 250 MCG tablet Take 250 mcg by mouth daily.      . Tiotropium Bromide Monohydrate (SPIRIVA RESPIMAT) 2.5 MCG/ACT AERS Inhale 2 puffs into the lungs daily.  4 g  0   No current facility-administered medications for this visit.    SURGICAL HISTORY:  Past Surgical History  Procedure Laterality Date  . Abcess drainage      abcess under Left arm, abcess from L breast  . Thyroid gland removed    . Removal of abnormal cells on uterus      REVIEW OF SYSTEMS:  A comprehensive review of systems was negative except for: Constitutional: positive for fatigue Respiratory: positive for dyspnea on exertion Musculoskeletal: positive for arthralgias and muscle weakness   PHYSICAL EXAMINATION: General appearance: alert, cooperative, fatigued and no  distress Head: Normocephalic, without obvious abnormality, atraumatic Neck: no adenopathy, no JVD, supple, symmetrical, trachea midline and thyroid not enlarged, symmetric, no tenderness/mass/nodules Lymph nodes: Cervical, supraclavicular, and axillary nodes normal. Resp: clear to auscultation bilaterally Back: symmetric, no curvature. ROM normal. No CVA tenderness. Cardio: regular rate and rhythm, S1, S2 normal, no murmur, click, rub or gallop GI: soft, non-tender; bowel sounds normal; no masses,  no organomegaly Extremities: extremities normal, atraumatic, no cyanosis or edema Neurologic: Alert and oriented X 3, normal strength and tone. Normal symmetric reflexes. Normal coordination and gait  ECOG PERFORMANCE STATUS: 2 - Symptomatic, <50% confined to bed  Blood pressure 158/79, pulse 105, temperature 98.5 F (36.9 C), temperature source Oral, resp. rate 19, height 5\' 9"  (1.753 m), weight 238 lb 1.6 oz (108.001 kg).  LABORATORY DATA: Lab Results  Component Value Date   WBC 8.1 12/28/2013   HGB 15.0 12/28/2013   HCT 47.6* 12/28/2013   MCV 90.5 12/28/2013   PLT 240 12/28/2013      Chemistry      Component Value Date/Time   NA 143 11/23/2013 0926   NA 141 11/04/2013 0558   K 3.8 11/23/2013 0926   K 4.1 11/04/2013 0558   CL 100 11/04/2013 0558   CO2 26 11/23/2013 0926   CO2 30 11/04/2013 0558   BUN 11.4 11/23/2013 0926   BUN 23 11/04/2013 0558   CREATININE 1.1 11/23/2013 0926   CREATININE 1.12* 11/06/2013 0440   CREATININE 1.16* 07/04/2012 1402      Component Value Date/Time   CALCIUM 9.0 11/23/2013 0926   CALCIUM 9.8 11/04/2013 0558   ALKPHOS 75 11/23/2013 0926   ALKPHOS 74 11/21/2012 0232   AST 19 11/23/2013 0926   AST 12 11/21/2012 0232   ALT 18 11/23/2013 0926   ALT 9 11/21/2012 0232   BILITOT 0.21 11/23/2013 0926   BILITOT 0.1* 11/21/2012 0232       RADIOGRAPHIC STUDIES: Ct Biopsy  12/14/2013   CLINICAL DATA:  68 year old female with a hypermetabolic left upper lobe pulmonary nodule concerning  for primary bronchogenic carcinoma. Of note, the nodular opacity can be seen on x-ray imaging dating back to 2013 suggesting that this is an indolent, slow growing process.  EXAM: CT BIOPSY  Date: 12/14/2013  PROCEDURE: 1. CT-guided biopsy of left upper lobe pulmonary nodule Interventional Radiologist:  Criselda Peaches, MD  ANESTHESIA/SEDATION: Moderate (conscious) sedation was used. Two mg Versed, 50 mcg Fentanyl were administered intravenously. The patient's vital signs were monitored continuously by radiology nursing throughout the procedure.  Sedation Time: 23 minutes  TECHNIQUE: Informed consent was obtained from the patient following explanation of the procedure, risks, benefits and alternatives. The patient understands, agrees and consents for the procedure. All questions were addressed. A time out was performed.  The patient was positioned in the LAO position on the table. A planning axial CT scan was performed. The  left upper lobe spiculated pulmonary nodule was identified. A suitable skin entry site was selected and marked. The region was then sterilely prepped and draped in the standard fashion using Betadine skin prep. Local anesthesia was attained by infiltration with 1% lidocaine. A small dermatotomy was made. Under intermittent CT fluoroscopic imaging, a 17 gauge trocar needle was advanced into the margin of the nodule. Several 18 gauge core biopsies were then coaxially obtained using the bio Pince automated biopsy device. Biopsy specimens were placed in formalin and delivered to pathology for further analysis.  The needles were removed. A follow-up axial CT scan demonstrates no evidence of pneumothorax, alveolar hemorrhage or other complicating feature. The patient tolerated the procedure very well.  COMPLICATIONS: None immediate.  IMPRESSION: Technically successful CT-guided core biopsy of left upper lobe pulmonary nodule.  Signed,  Criselda Peaches, MD  [facility]  [facility]   Electronically Signed   By: Jacqulynn Cadet M.D.   On: 12/14/2013 10:10   Dg Chest Port 1 View  12/14/2013   CLINICAL DATA:  Post LEFT lung biopsy  EXAM: PORTABLE CHEST - 1 VIEW  COMPARISON:  Two view chest dated 11/03/2013  FINDINGS: The heart size and mediastinal contours are within normal limits. Both lungs are clear. The visualized skeletal structures are unremarkable. No pneumothorax.  IMPRESSION: No active disease.  No pneumothorax.   Electronically Signed   By: Margaree Mackintosh M.D.   On: 12/14/2013 12:02   Mm Digital Screening Bilateral  12/11/2013   CLINICAL DATA:  Screening.  EXAM: DIGITAL SCREENING BILATERAL MAMMOGRAM WITH CAD  COMPARISON:  01/27/2006  ACR Breast Density Category b: There are scattered areas of fibroglandular density.  FINDINGS: In the left breast possible mass warrants further imaging evaluation with spot compression views and possibly ultrasound. In the right breast, possible mass warrants further imaging evaluation with spot compression views and possibly ultrasound. Images were processed with CAD.  IMPRESSION: Further imaging evaluation is suggested for possible mass in the left breast.  Further imaging evaluation is suggested for possible mass in the right breast.  RECOMMENDATION: Diagnostic mammogram and possibly ultrasound of both breasts. (Code:FI-B-20M)  The patient will be contacted regarding the findings, and additional imaging will be scheduled.  BI-RADS CATEGORY  0: Incomplete. Need additional imaging evaluation and/or prior mammograms for comparison.   Electronically Signed   By: Curlene Dolphin M.D.   On: 12/11/2013 11:12    ASSESSMENT AND PLAN: This is a very pleasant 68 years old American female with unresectable a stage IA non-small cell lung cancer, adenocarcinoma. The patient has poor pulmonary function and was not a good candidate for resection. I have a lengthy discussion with the patient today about her current condition  and treatment options. I recommended for her referral to radiation oncology for consideration of stereotactic radiotherapy to the left upper lobe lung nodule. I would see her back for followup visit in 6 months with repeat CT scan of the chest for evaluation of her disease. Regarding the abnormalities in the breast, the patient gives a history of previous breast infection and abscesses. She scheduled for repeat mammogram and ultrasound of the breasts. If there is any evidence for malignancy, I will arrange for the patient a visit with the multidisciplinary breast clinic. She was advised to call immediately if she has any concerning symptoms and interval. The patient voices understanding of current disease status and treatment options and is in agreement with the current care plan.  All  questions were answered. The patient knows to call the clinic with any problems, questions or concerns. We can certainly see the patient much sooner if necessary.  I spent 15 minutes counseling the patient face to face. The total time spent in the appointment was 25 minutes.  Disclaimer: This note was dictated with voice recognition software. Similar sounding words can inadvertently be transcribed and may not be corrected upon review.

## 2013-12-28 NOTE — Telephone Encounter (Signed)
error 

## 2013-12-28 NOTE — Patient Instructions (Signed)
Smoking Cessation, Tips for Success If you are ready to quit smoking, congratulations! You have chosen to help yourself be healthier. Cigarettes bring nicotine, tar, carbon monoxide, and other irritants into your body. Your lungs, heart, and blood vessels will be able to work better without these poisons. There are many different ways to quit smoking. Nicotine gum, nicotine patches, a nicotine inhaler, or nicotine nasal spray can help with physical craving. Hypnosis, support groups, and medicines help break the habit of smoking. WHAT THINGS CAN I DO TO MAKE QUITTING EASIER?  Here are some tips to help you quit for good:  Pick a date when you will quit smoking completely. Tell all of your friends and family about your plan to quit on that date.  Do not try to slowly cut down on the number of cigarettes you are smoking. Pick a quit date and quit smoking completely starting on that day.  Throw away all cigarettes.   Clean and remove all ashtrays from your home, work, and car.   On a card, write down your reasons for quitting. Carry the card with you and read it when you get the urge to smoke.   Cleanse your body of nicotine. Drink enough water and fluids to keep your urine clear or pale yellow. Do this after quitting to flush the nicotine from your body.   Learn to predict your moods. Do not let a bad situation be your excuse to have a cigarette. Some situations in your life might tempt you into wanting a cigarette.   Never have "just one" cigarette. It leads to wanting another and another. Remind yourself of your decision to quit.   Change habits associated with smoking. If you smoked while driving or when feeling stressed, try other activities to replace smoking. Stand up when drinking your coffee. Brush your teeth after eating. Sit in a different chair when you read the paper. Avoid alcohol while trying to quit, and try to drink fewer caffeinated beverages. Alcohol and caffeine may urge  you to smoke.   Avoid foods and drinks that can trigger a desire to smoke, such as sugary or spicy foods and alcohol.   Ask people who smoke not to smoke around you.   Have something planned to do right after eating or having a cup of coffee. For example, plan to take a walk or exercise.   Try a relaxation exercise to calm you down and decrease your stress. Remember, you may be tense and nervous for the first 2 weeks after you quit, but this will pass.   Find new activities to keep your hands busy. Play with a pen, coin, or rubber band. Doodle or draw things on paper.   Brush your teeth right after eating. This will help cut down on the craving for the taste of tobacco after meals. You can also try mouthwash.   Use oral substitutes in place of cigarettes. Try using lemon drops, carrots, cinnamon sticks, or chewing gum. Keep them handy so they are available when you have the urge to smoke.   When you have the urge to smoke, try deep breathing.   Designate your home as a nonsmoking area.   If you are a heavy smoker, ask your health care provider about a prescription for nicotine chewing gum. It can ease your withdrawal from nicotine.   Reward yourself. Set aside the cigarette money you save and buy yourself something nice.   Look for support from others. Join a support group or   smoking cessation program. Ask someone at home or at work to help you with your plan to quit smoking.   Always ask yourself, "Do I need this cigarette or is this just a reflex?" Tell yourself, "Today, I choose not to smoke," or "I do not want to smoke." You are reminding yourself of your decision to quit.  Do not replace cigarette smoking with electronic cigarettes (commonly called e-cigarettes). The safety of e-cigarettes is unknown, and some may contain harmful chemicals.  If you relapse, do not give up! Plan ahead and think about what you will do the next time you get the urge to smoke.  HOW WILL  I FEEL WHEN I QUIT SMOKING? You may have symptoms of withdrawal because your body is used to nicotine (the addictive substance in cigarettes). You may crave cigarettes, be irritable, feel very hungry, cough often, get headaches, or have difficulty concentrating. The withdrawal symptoms are only temporary. They are strongest when you first quit but will go away within 10-14 days. When withdrawal symptoms occur, stay in control. Think about your reasons for quitting. Remind yourself that these are signs that your body is healing and getting used to being without cigarettes. Remember that withdrawal symptoms are easier to treat than the major diseases that smoking can cause.  Even after the withdrawal is over, expect periodic urges to smoke. However, these cravings are generally short lived and will go away whether you smoke or not. Do not smoke!  WHAT RESOURCES ARE AVAILABLE TO HELP ME QUIT SMOKING? Your health care provider can direct you to community resources or hospitals for support, which may include:  Group support.  Education.  Hypnosis.  Therapy. Document Released: 03/13/2004 Document Revised: 04/05/2013 Document Reviewed: 12/01/2012 [facility] Patient Information 2015 [place], [place]. This information is not intended to replace advice given to you by your health care provider. Make sure you discuss any questions you have with your health care provider.

## 2014-01-01 ENCOUNTER — Encounter: Payer: Self-pay | Admitting: Radiation Oncology

## 2014-01-01 NOTE — Progress Notes (Addendum)
Thoracic Location of Tumor / Histology: Left Upper Lung   Patient presented  months ago with symptoms of: sob, one month congestion,dry cough   Biopsies of (if applicable) revealed:Diagnosis 12/14/13: Lung, needle/core biopsy(ies), LUL- ADENOCARCINOMA.  Tobacco/Marijuana/Snuff/ETOH use: 1ppd cigarettes 49 years, now 1-3 cigarettes daily ,never used smokeless tobacco,  no alcohol use in 20 years,   Past/Anticipated interventions by cardiothoracic surgery, if any: saw Dr. Servando Snare 12/09/13:Because of PET findings in the breast she's had no mammogram for the last 8 years, mammogram has been done results are still pending  her pulmonary function studies are extremely poor, she would be a very poor surgical candidate for resection . I discussed this with her and we'll make arrangements for needle biopsy of the left upper lobe lesion and consider stereotactic radiotherapy with the results of the mammogram are available this may be need to be addressed also .  Following the results of the needle biopsy she will be seen in [facility] clinic for consideration of further treatment , nonoperative .   Past/Anticipated interventions by medical oncology, if any: seen Dr. [name] 12/28/13 recommend SRS to LUL lung f/u 07/12/2014  Signs/Symptoms  Weight changes, if any: No  Respiratory complaints, if any: COPD,Asthma, on Home Oxygen 7 years  Hemoptysis, if any: No  Pain issues, if any: left lower back 8/10 scale ran out of her oxycodone  SAFETY ISSUES:  Prior radiation? NO  Pacemaker/ICD? NO  Possible current pregnancy? No  Is the patient on methotrexate?  NO  Current Complaints / other details:  Depression, obesity, sister pancreatic   And lung  Cancer, 2  Brothers  pancreatic cancer,( #044) under Dr.Campbell care [facility]?[place] bi lat mammogram oredered afetr 12/07/13 of mammogram  Shows possible mass in Right & Left breast,pending, Hx goiter age 28 removed surgery

## 2014-01-03 ENCOUNTER — Ambulatory Visit
Admission: RE | Admit: 2014-01-03 | Discharge: 2014-01-03 | Disposition: A | Discharge status: Home / Self Care | Payer: Medicare Other | Source: Ambulatory Visit | Attending: Radiation Oncology | Admitting: Radiation Oncology

## 2014-01-03 ENCOUNTER — Other Ambulatory Visit: Payer: Self-pay | Admitting: *Deleted

## 2014-01-03 ENCOUNTER — Encounter: Payer: Self-pay | Admitting: Radiation Oncology

## 2014-01-03 VITALS — BP 138/78 | HR 89 | Temp 98.7°F | Resp 20 | Ht 69.0 in | Wt 238.7 lb

## 2014-01-03 DIAGNOSIS — C3410 Malignant neoplasm of upper lobe, unspecified bronchus or lung: Secondary | ICD-10-CM | POA: Diagnosis not present

## 2014-01-03 DIAGNOSIS — F17200 Nicotine dependence, unspecified, uncomplicated: Secondary | ICD-10-CM | POA: Insufficient documentation

## 2014-01-03 DIAGNOSIS — Z7982 Long term (current) use of aspirin: Secondary | ICD-10-CM | POA: Insufficient documentation

## 2014-01-03 DIAGNOSIS — J449 Chronic obstructive pulmonary disease, unspecified: Secondary | ICD-10-CM | POA: Insufficient documentation

## 2014-01-03 DIAGNOSIS — Z510 Encounter for antineoplastic radiation therapy: Secondary | ICD-10-CM | POA: Diagnosis not present

## 2014-01-03 DIAGNOSIS — B20 Human immunodeficiency virus [HIV] disease: Secondary | ICD-10-CM | POA: Diagnosis not present

## 2014-01-03 DIAGNOSIS — Z9981 Dependence on supplemental oxygen: Secondary | ICD-10-CM | POA: Diagnosis not present

## 2014-01-03 DIAGNOSIS — R0602 Shortness of breath: Secondary | ICD-10-CM | POA: Diagnosis not present

## 2014-01-03 DIAGNOSIS — C3412 Malignant neoplasm of upper lobe, left bronchus or lung: Secondary | ICD-10-CM

## 2014-01-03 DIAGNOSIS — J4489 Other specified chronic obstructive pulmonary disease: Secondary | ICD-10-CM | POA: Insufficient documentation

## 2014-01-03 DIAGNOSIS — B1910 Unspecified viral hepatitis B without hepatic coma: Secondary | ICD-10-CM | POA: Diagnosis not present

## 2014-01-03 DIAGNOSIS — C3490 Malignant neoplasm of unspecified part of unspecified bronchus or lung: Secondary | ICD-10-CM

## 2014-01-03 HISTORY — DX: Malignant neoplasm of unspecified part of unspecified bronchus or lung: C34.90

## 2014-01-03 HISTORY — DX: Unspecified viral hepatitis B without hepatic coma: B19.10

## 2014-01-03 HISTORY — DX: Other specified postprocedural states: Z98.890

## 2014-01-03 HISTORY — DX: Myoneural disorder, unspecified: G70.9

## 2014-01-03 MED ORDER — OXYCODONE-ACETAMINOPHEN 5-325 MG PO TABS: 1.0000 | ORAL_TABLET | Freq: Four times a day (QID) | ORAL | Status: DC | PRN

## 2014-01-03 NOTE — Progress Notes (Signed)
Radiation Oncology         (336) [phone] ________________________________  Name: [name] MRN: [id number]  Date: 01/03/2014  DOB: 06-23-1946  YH:CWCBJS,EGBT, MD  Curt Bears, MD   Lanelle Bal, MD  REFERRING PHYSICIAN: Curt Bears, MD   DIAGNOSIS: The primary encounter diagnosis was Malignant neoplasm of upper lobe of left lung. A diagnosis of Malignant neoplasm of upper lobe, bronchus or lung was also pertinent to this visit.   HISTORY OF PRESENT ILLNESS::[name] is a 68 y.o. female who is seen for an initial consultation visit. The patient has a history of significant lung disease with COPD. She has been on home oxygen for approximately 7 years. The patient has therefore had imaging in the past in the setting of shortness of breath. Notably, the patient did have a 4 mm left upper lobe nodule which was seen in [facility] in 2009.  The patient recently complained of a one-month history of chest congestion and shortness of breath. This led to a chest x-ray and ultimately a CT angiogram of the chest on 11/03/2013. No pulmonary embolism was seen by a spiculated mass was seen within the left lung which was suspicious for bronchogenic carcinoma.  Further workup has included a PET scan on 11/22/2013. A hypermetabolic 2.2 cm spiculated nodule was present within the left upper lobe consistent with bronchogenic carcinoma. There is no evidence of nodal or distant metastatic disease. The patient was noted to have subcentimeter bilateral breast nodules and axillary lymph node with low-grade metabolic activity. This was felt to be nonspecific. The patient has indicated that she has had a history of multiple breast abscesses which have led to multiple surgeries. She estimates that she has had 6 surgeries to address this issue. The patient is scheduled for further workup of this issue however next week at the breast Center.  The patient has undergone a CT guided  biopsy of the left lung nodule. 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 The patient has seen Dr. Servando Snare regarding possible surgery. On the basis of her COPD and pulmonary function testing, he does not recommend surgery for her case. Therefore I have been asked to see the patient for possible stereotactic body radiation treatment.   PREVIOUS RADIATION THERAPY: No   PAST MEDICAL HISTORY:  has a past medical history of COPD (chronic obstructive pulmonary disease); Hyperlipidemia; HIV (human immunodeficiency virus infection); Asthma; Lung cancer (12/14/13); H/O drainage of abscess; Hep B w/o coma; and Neuromuscular disorder.     PAST SURGICAL HISTORY: Past Surgical History  Procedure Laterality Date  . Abcess drainage      abcess under Left arm, abcess from L breast  . Thyroid gland removed    . Removal of abnormal cells on uterus       FAMILY HISTORY: family history includes Cancer in her brother, brother, and sister; Diabetes in her sister; Hypertension in her sister; Pneumonia in her mother.   SOCIAL HISTORY:  reports that she has been smoking Cigarettes.  She has a 4 pack-year smoking history. She has never used smokeless tobacco. She reports that she does not drink alcohol or use illicit drugs.   ALLERGIES: Review of patient's allergies indicates no known allergies.   MEDICATIONS:  Current Outpatient Prescriptions  Medication Sig Dispense Refill  . albuterol (PROVENTIL HFA;VENTOLIN HFA) 108 (90 BASE) MCG/ACT inhaler Inhale 1 puff into the lungs 2 (two) times daily.       Marland Kitchen albuterol (PROVENTIL) (2.5 MG/3ML) 0.083%  nebulizer solution Take 2.5 mg by nebulization 3 (three) times daily.      Marland Kitchen aspirin EC 81 MG tablet Take 81 mg by mouth daily.      . Calcium Carbonate-Vitamin D (CALCIUM 600+D) 600-400 MG-UNIT per tablet Take 1 tablet by mouth daily.      Marland Kitchen efavirenz-emtricitabine-tenofovir (ATRIPLA) 600-200-300 MG per tablet Take 1 tablet by mouth at  bedtime.      . Fluticasone-Salmeterol (ADVAIR) 500-50 MCG/DOSE AEPB Inhale 1 puff into the lungs every 12 (twelve) hours.      . gabapentin (NEURONTIN) 300 MG capsule Take 300 mg by mouth 3 (three) times daily.      Marland Kitchen loratadine (CLARITIN) 10 MG tablet Take 10 mg by mouth daily.      . meloxicam (MOBIC) 15 MG tablet Take 15 mg by mouth daily.      . montelukast (SINGULAIR) 10 MG tablet Take 1 tablet (10 mg total) by mouth at bedtime.  30 tablet  6  . nicotine (NICODERM CQ - DOSED IN MG/24 HOURS) 21 mg/24hr patch Place 1 patch (21 mg total) onto the skin daily.  28 patch  0  . oxyCODONE-acetaminophen (PERCOCET/ROXICET) 5-325 MG per tablet Take 1 tablet by mouth every 6 (six) hours as needed for severe pain (pain).  30 tablet  0  . Tiotropium Bromide Monohydrate (SPIRIVA RESPIMAT) 2.5 MCG/ACT AERS Inhale 2 puffs into the lungs daily.  4 g  0  . vitamin B-12 (CYANOCOBALAMIN) 250 MCG tablet Take 250 mcg by mouth daily.      Marland Kitchen venlafaxine XR (EFFEXOR-XR) 150 MG 24 hr capsule Take 150 mg by mouth daily.       No current facility-administered medications for this encounter.     REVIEW OF SYSTEMS:  A 15 point review of systems is documented in the electronic medical record. This was obtained by the nursing staff. However, I reviewed this with the patient to discuss relevant findings and make appropriate changes.  Pertinent items are noted in HPI.    PHYSICAL EXAM:  height is 5\' 9"  (1.753 m) and weight is 238 lb 11.2 oz (108.274 kg). Her oral temperature is 98.7 F (37.1 C). Her blood pressure is 138/78 and her pulse is 89. Her respiration is 20 and oxygen saturation is 90%.   ECOG = 2  0 - Asymptomatic (Fully active, able to carry on all predisease activities without restriction)  1 - Symptomatic but completely ambulatory (Restricted in physically strenuous activity but ambulatory and able to carry out work of a light or sedentary nature. For example, light housework, office work)  2 -  Symptomatic, <50% in bed during the day (Ambulatory and capable of all self care but unable to carry out any work activities. Up and about more than 50% of waking hours)  3 - Symptomatic, >50% in bed, but not bedbound (Capable of only limited self-care, confined to bed or chair 50% or more of waking hours)  4 - Bedbound (Completely disabled. Cannot carry on any self-care. Totally confined to bed or chair)  5 - Death   Eustace Pen MM, Creech RH, Tormey DC, et al. [phone]). "Toxicity and response criteria of the [facility] Group". [place] Oncol. 5 (6): 649-55  General: Well-developed, in no acute distress HEENT: Normocephalic, atraumatic; oral cavity clear Neck: Supple without any lymphadenopathy Cardiovascular: Regular rate and rhythm Respiratory: Clear to auscultation bilaterally GI: Soft, nontender, normal bowel sounds Extremities: No edema present Neuro: No focal deficits  LABORATORY DATA:  Lab Results  Component Value Date   WBC 8.1 12/28/2013   HGB 15.0 12/28/2013   HCT 47.6* 12/28/2013   MCV 90.5 12/28/2013   PLT 240 12/28/2013   Lab Results  Component Value Date   NA 143 12/28/2013   K 3.8 12/28/2013   CL 100 11/04/2013   CO2 28 12/28/2013   Lab Results  Component Value Date   ALT 16 12/28/2013   AST 17 12/28/2013   ALKPHOS 76 12/28/2013   BILITOT 0.24 12/28/2013      RADIOGRAPHY: Ct Biopsy  12/14/2013   CLINICAL DATA:  68 year old female with a hypermetabolic left upper lobe pulmonary nodule concerning for primary bronchogenic carcinoma. Of note, the nodular opacity can be seen on x-ray imaging dating back to 2013 suggesting that this is an indolent, slow growing process.  EXAM: CT BIOPSY  Date: 12/14/2013  PROCEDURE: 1. CT-guided biopsy of left upper lobe pulmonary nodule Interventional Radiologist:  Criselda Peaches, MD  ANESTHESIA/SEDATION: Moderate (conscious) sedation was used. Two mg Versed, 50 mcg Fentanyl were administered intravenously. The patient's vital  signs were monitored continuously by radiology nursing throughout the procedure.  Sedation Time: 23 minutes  TECHNIQUE: Informed consent was obtained from the patient following explanation of the procedure, risks, benefits and alternatives. The patient understands, agrees and consents for the procedure. All questions were addressed. A time out was performed.  The patient was positioned in the LAO position on the table. A planning axial CT scan was performed. The left upper lobe spiculated pulmonary nodule was identified. A suitable skin entry site was selected and marked. The region was then sterilely prepped and draped in the standard fashion using Betadine skin prep. Local anesthesia was attained by infiltration with 1% lidocaine. A small dermatotomy was made. Under intermittent CT fluoroscopic imaging, a 17 gauge trocar needle was advanced into the margin of the nodule. Several 18 gauge core biopsies were then coaxially obtained using the bio Pince automated biopsy device. Biopsy specimens were placed in formalin and delivered to pathology for further analysis.  The needles were removed. A follow-up axial CT scan demonstrates no evidence of pneumothorax, alveolar hemorrhage or other complicating feature. The patient tolerated the procedure very well.  COMPLICATIONS: None immediate.  IMPRESSION: Technically successful CT-guided core biopsy of left upper lobe pulmonary nodule.  Signed,  Criselda Peaches, MD  [facility]  [facility]   Electronically Signed   By: Jacqulynn Cadet M.D.   On: 12/14/2013 10:10   Dg Chest Port 1 View  12/14/2013   CLINICAL DATA:  Post LEFT lung biopsy  EXAM: PORTABLE CHEST - 1 VIEW  COMPARISON:  Two view chest dated 11/03/2013  FINDINGS: The heart size and mediastinal contours are within normal limits. Both lungs are clear. The visualized skeletal structures are unremarkable. No pneumothorax.  IMPRESSION: No active disease.  No  pneumothorax.   Electronically Signed   By: Margaree Mackintosh M.D.   On: 12/14/2013 12:02   Mm Digital Screening Bilateral  12/11/2013   CLINICAL DATA:  Screening.  EXAM: DIGITAL SCREENING BILATERAL MAMMOGRAM WITH CAD  COMPARISON:  01/27/2006  ACR Breast Density Category b: There are scattered areas of fibroglandular density.  FINDINGS: In the left breast possible mass warrants further imaging evaluation with spot compression views and possibly ultrasound. In the right breast, possible mass warrants further imaging evaluation with spot compression views and possibly ultrasound. Images were processed with CAD.  IMPRESSION: Further imaging evaluation is suggested  for possible mass in the left breast.  Further imaging evaluation is suggested for possible mass in the right breast.  RECOMMENDATION: Diagnostic mammogram and possibly ultrasound of both breasts. (Code:[id number])  The patient will be contacted regarding the findings, and additional imaging will be scheduled.  BI-RADS CATEGORY  0: Incomplete. Need additional imaging evaluation and/or prior mammograms for comparison.   Electronically Signed   By: Curlene Dolphin M.D.   On: 12/11/2013 11:12       IMPRESSION: The patient and this is adenocarcinoma of the left upper lung, T1bN0M0.  The patient is not felt to be a good candidate for surgery and I do believe that she is a good candidate for stereotactic body radiation treatment.  I discussed this treatment option with the patient today we're in we discussed in detail the possible benefit of such a treatment as definitive management for this recent diagnosis. We also discussed the possible side effects and risks of treatment as well. In general, the patient's tumor is in a favorable location within the left lung.  After this detailed discussion, the patient indicated that she wished to proceed with radiation treatment.   PLAN: The patient will be scheduled for simulation in the near future such that I can  proceed with treatment planning. I anticipate treating the patient with 3 fractions to a total dose of 54 Gy.       ________________________________   Jodelle Gross, MD, PhD   **Disclaimer: This note was dictated with voice recognition software. Similar sounding words can inadvertently be transcribed and this note may contain transcription errors which may not have been corrected upon publication of note.**

## 2014-01-03 NOTE — Telephone Encounter (Signed)
Walk in form received with request for percocet refill.  This med was last written 12-15-2013 for quantity of thirty.

## 2014-01-03 NOTE — Progress Notes (Signed)
Please see the Nurse Progress Note in the MD Initial Consult Encounter for this patient. 

## 2014-01-04 ENCOUNTER — Telehealth: Payer: Self-pay | Admitting: *Deleted

## 2014-01-04 NOTE — Telephone Encounter (Signed)
Received call from [facility] [phone].  Pt needs PCP to evaluate her for mobility to get a hover-round.  Per dr Vista Mink, she needs to contact her PCP.  Informed representative from hover-round, they will inform patient.  SLJ

## 2014-01-08 ENCOUNTER — Ambulatory Visit: Payer: Medicare Other | Admitting: Infectious Disease

## 2014-01-09 ENCOUNTER — Other Ambulatory Visit: Payer: Self-pay | Admitting: Cardiothoracic Surgery

## 2014-01-09 ENCOUNTER — Ambulatory Visit
Admission: RE | Admit: 2014-01-09 | Discharge: 2014-01-09 | Disposition: A | Discharge status: Home / Self Care | Payer: Medicare Other | Source: Ambulatory Visit | Attending: Cardiothoracic Surgery | Admitting: Cardiothoracic Surgery

## 2014-01-09 DIAGNOSIS — R928 Other abnormal and inconclusive findings on diagnostic imaging of breast: Secondary | ICD-10-CM

## 2014-01-10 ENCOUNTER — Other Ambulatory Visit: Payer: Self-pay | Admitting: Cardiothoracic Surgery

## 2014-01-10 ENCOUNTER — Ambulatory Visit
Admission: RE | Admit: 2014-01-10 | Discharge: 2014-01-10 | Disposition: A | Discharge status: Home / Self Care | Payer: Medicare Other | Source: Ambulatory Visit | Attending: Cardiothoracic Surgery | Admitting: Cardiothoracic Surgery

## 2014-01-10 ENCOUNTER — Ambulatory Visit
Admission: RE | Admit: 2014-01-10 | Discharge: 2014-01-10 | Disposition: A | Discharge status: Home / Self Care | Payer: Medicare Other | Source: Ambulatory Visit

## 2014-01-10 DIAGNOSIS — N6310 Unspecified lump in the right breast, unspecified quadrant: Secondary | ICD-10-CM

## 2014-01-10 DIAGNOSIS — R928 Other abnormal and inconclusive findings on diagnostic imaging of breast: Secondary | ICD-10-CM

## 2014-01-10 DIAGNOSIS — N63 Unspecified lump in unspecified breast: Secondary | ICD-10-CM

## 2014-01-11 ENCOUNTER — Other Ambulatory Visit: Payer: Self-pay | Admitting: Cardiothoracic Surgery

## 2014-01-11 DIAGNOSIS — N63 Unspecified lump in unspecified breast: Secondary | ICD-10-CM

## 2014-01-15 ENCOUNTER — Ambulatory Visit: Payer: Medicare Other | Admitting: Radiation Oncology

## 2014-01-15 ENCOUNTER — Ambulatory Visit (INDEPENDENT_AMBULATORY_CARE_PROVIDER_SITE_OTHER): Payer: Medicare Other | Admitting: Infectious Disease

## 2014-01-15 ENCOUNTER — Encounter: Payer: Self-pay | Admitting: Infectious Disease

## 2014-01-15 VITALS — BP 149/76 | HR 90 | Temp 98.5°F | Ht 69.0 in | Wt 238.8 lb

## 2014-01-15 DIAGNOSIS — G63 Polyneuropathy in diseases classified elsewhere: Secondary | ICD-10-CM | POA: Diagnosis not present

## 2014-01-15 DIAGNOSIS — C3490 Malignant neoplasm of unspecified part of unspecified bronchus or lung: Secondary | ICD-10-CM | POA: Diagnosis not present

## 2014-01-15 DIAGNOSIS — B20 Human immunodeficiency virus [HIV] disease: Secondary | ICD-10-CM

## 2014-01-15 DIAGNOSIS — C3492 Malignant neoplasm of unspecified part of left bronchus or lung: Secondary | ICD-10-CM

## 2014-01-15 DIAGNOSIS — Z21 Asymptomatic human immunodeficiency virus [HIV] infection status: Secondary | ICD-10-CM | POA: Diagnosis not present

## 2014-01-15 MED ORDER — GABAPENTIN 300 MG PO CAPS: 600.0000 mg | ORAL_CAPSULE | Freq: Three times a day (TID) | ORAL | Status: DC

## 2014-01-15 MED ORDER — EFAVIRENZ-EMTRICITAB-TENOFOVIR 600-200-300 MG PO TABS: 1.0000 | ORAL_TABLET | Freq: Every day | ORAL | Status: DC

## 2014-01-15 NOTE — Progress Notes (Signed)
   Subjective:    Patient ID: [name], female    DOB: 1945-07-19, 68 y.o.   MRN: [id number]  HPI   68 year old African American lady with HIV that has been perfectly controlled on Atripla and followed by Dr. Megan Salon having not been seen since January 2014. Until I saw her this spring.  She has been found to have an  adenocarcinoma of the left upper lung, T1bN0M0. The patient is not felt to be a good candidate for surgery. She is about to undergo radiation therapy and the care of Dr. Lisbeth Renshaw. Currently her HIV is well controlled with a viral load essentially undetectable and CD4 count but is very healthy. She is intolerant to triple quite well  Lab Results  Component Value Date   HIV1RNAQUANT 27* 11/01/2013   Lab Results  Component Value Date   CD4TABS 910 11/01/2013   CD4TABS 1610 03/31/2012   CD4TABS 720 12/03/2011     Is suffering still from neuropathic pain that has not responding to only 3 times a day gabapentin and I offered to escalate the dose of this.    Review of Systems  Constitutional: Negative for fever, chills, diaphoresis, activity change, appetite change, fatigue and unexpected weight change.  HENT: Negative for congestion and sore throat.   Eyes: Negative for photophobia and visual disturbance.  Respiratory: Positive for shortness of breath. Negative for chest tightness, wheezing and stridor.   Cardiovascular: Negative for chest pain, palpitations and leg swelling.  Gastrointestinal: Negative for nausea, abdominal pain and abdominal distention.  Genitourinary: Negative for flank pain.  Musculoskeletal: Positive for arthralgias. Negative for back pain, gait problem, joint swelling and myalgias.  Skin: Negative for rash and wound.  Neurological: Negative for dizziness, tremors, weakness and light-headedness.  Hematological: Negative for adenopathy. Does not bruise/bleed easily.  Psychiatric/Behavioral: Negative for behavioral problems, confusion, sleep disturbance,  decreased concentration and agitation.       Objective:   Physical Exam  Constitutional: She is oriented to person, place, and time. She appears well-developed and well-nourished. No distress.  HENT:  Head: Normocephalic and atraumatic.  Mouth/Throat: Oropharynx is clear and moist. No oropharyngeal exudate.  Eyes: Conjunctivae and EOM are normal. No scleral icterus.  Neck: Normal range of motion. Neck supple.  Cardiovascular: Normal rate and regular rhythm.   Pulmonary/Chest: Effort normal. No respiratory distress. She has no wheezes.  Abdominal: Soft. She exhibits no distension.  Musculoskeletal: She exhibits no edema.  Neurological: She is alert and oriented to person, place, and time. Coordination normal.  Skin: Skin is warm and dry. She is not diaphoretic. No erythema. No pallor.  Psychiatric: Her behavior is normal. Judgment and thought content normal. Her mood appears anxious.          Assessment & Plan:   HIV: perfect control Atripla continue this. patient and in coordination of their care.  Adenocarcinoma: not felt to be surgical candidate undergoing radiation therapy shortly.  Neuropathic pains: Increase gabapentin dose.

## 2014-01-17 ENCOUNTER — Encounter: Payer: Self-pay | Admitting: *Deleted

## 2014-01-17 ENCOUNTER — Ambulatory Visit
Admission: RE | Admit: 2014-01-17 | Discharge: 2014-01-17 | Disposition: A | Discharge status: Home / Self Care | Payer: Medicare Other | Source: Ambulatory Visit | Attending: Radiation Oncology | Admitting: Radiation Oncology

## 2014-01-17 DIAGNOSIS — Z510 Encounter for antineoplastic radiation therapy: Secondary | ICD-10-CM | POA: Diagnosis not present

## 2014-01-17 DIAGNOSIS — C3410 Malignant neoplasm of upper lobe, unspecified bronchus or lung: Secondary | ICD-10-CM

## 2014-01-17 NOTE — Progress Notes (Signed)
[place] Psychosocial Distress Screening Clinical Social Work  Clinical Social Work was referred by distress screening protocol.  The patient scored a 8 on the Psychosocial Distress Thermometer which indicates severe distress. Clinical Social Worker attempted to contact patient multiple times to assess for distress and other psychosocial needs. CSW unable to leave voicemail on patient's phone.  ONCBCN DISTRESS SCREENING 01/03/2014  [name] the number that describes how much distress you have been experiencing in the past week 8  Practical problem type Housing;Transportation;Food  Emotional problem type Nervousness/Anxiety;Adjusting to illness  Physical Problem type Pain;Tingling hands/feet;Breathing  Physician notified of physical symptoms Yes  Referral to clinical social work Yes  Referral to financial advocate Yes    Clinical Social Worker follow up needed: Yes.    If yes, follow up plan:  Will attempt to reach patient at another time.  Polo Riley, MSW, LCSW, OSW-C Clinical Social Worker [facility] [phone]

## 2014-01-19 ENCOUNTER — Telehealth (HOSPITAL_COMMUNITY): Payer: Self-pay

## 2014-01-19 NOTE — Telephone Encounter (Signed)
Called patient regarding Pulmonary Rehab.  There was no way to leave a message.  Letter sent.

## 2014-01-22 ENCOUNTER — Telehealth (HOSPITAL_COMMUNITY): Payer: Self-pay

## 2014-01-22 NOTE — Telephone Encounter (Signed)
Called patient to discuss Pulmonary Rehab.  Patient states that she is interested but would like to schedule her orientation after she completes radiation. Chaunte states that she will call us back to schedule.

## 2014-01-25 DIAGNOSIS — Z510 Encounter for antineoplastic radiation therapy: Secondary | ICD-10-CM | POA: Diagnosis not present

## 2014-01-26 ENCOUNTER — Ambulatory Visit
Admission: RE | Admit: 2014-01-26 | Discharge: 2014-01-26 | Disposition: A | Discharge status: Home / Self Care | Payer: Medicare Other | Source: Ambulatory Visit | Attending: Radiation Oncology | Admitting: Radiation Oncology

## 2014-01-26 VITALS — BP 138/76 | HR 85 | Temp 98.0°F | Resp 12 | Wt 227.7 lb

## 2014-01-26 DIAGNOSIS — Z510 Encounter for antineoplastic radiation therapy: Secondary | ICD-10-CM | POA: Diagnosis not present

## 2014-01-26 DIAGNOSIS — C3410 Malignant neoplasm of upper lobe, unspecified bronchus or lung: Secondary | ICD-10-CM

## 2014-01-26 NOTE — Progress Notes (Signed)
Department of Radiation Oncology  Phone:  [phone] Fax:        [phone]  Weekly Treatment Note    Name: [name] Date: 01/26/2014 MRN: [id number] DOB: 03-05-1946   Current dose: 18 Gy  Current fraction:1   MEDICATIONS: Current Outpatient Prescriptions  Medication Sig Dispense Refill  . albuterol (PROVENTIL HFA;VENTOLIN HFA) 108 (90 BASE) MCG/ACT inhaler Inhale 1 puff into the lungs 2 (two) times daily.       Marland Kitchen albuterol (PROVENTIL) (2.5 MG/3ML) 0.083% nebulizer solution Take 2.5 mg by nebulization 3 (three) times daily.      Marland Kitchen aspirin EC 81 MG tablet Take 81 mg by mouth daily.      . Calcium Carbonate-Vitamin D (CALCIUM 600+D) 600-400 MG-UNIT per tablet Take 1 tablet by mouth daily.      Marland Kitchen efavirenz-emtricitabine-tenofovir (ATRIPLA) 600-200-300 MG per tablet Take 1 tablet by mouth at bedtime.  30 tablet  11  . Fluticasone-Salmeterol (ADVAIR) 500-50 MCG/DOSE AEPB Inhale 1 puff into the lungs every 12 (twelve) hours.      . gabapentin (NEURONTIN) 300 MG capsule Take 2 capsules (600 mg total) by mouth 3 (three) times daily.  180 capsule  11  . loratadine (CLARITIN) 10 MG tablet Take 10 mg by mouth daily.      . meloxicam (MOBIC) 15 MG tablet Take 15 mg by mouth daily.      . montelukast (SINGULAIR) 10 MG tablet Take 1 tablet (10 mg total) by mouth at bedtime.  30 tablet  6  . nicotine (NICODERM CQ - DOSED IN MG/24 HOURS) 21 mg/24hr patch Place 1 patch (21 mg total) onto the skin daily.  28 patch  0  . oxyCODONE-acetaminophen (PERCOCET/ROXICET) 5-325 MG per tablet Take 1 tablet by mouth every 6 (six) hours as needed for severe pain (pain).  30 tablet  0  . Tiotropium Bromide Monohydrate (SPIRIVA RESPIMAT) 2.5 MCG/ACT AERS Inhale 2 puffs into the lungs daily.  4 g  0  . venlafaxine XR (EFFEXOR-XR) 150 MG 24 hr capsule Take 150 mg by mouth daily.      . vitamin B-12 (CYANOCOBALAMIN) 250 MCG tablet Take 250 mcg by mouth daily.       No current facility-administered  medications for this encounter.     ALLERGIES: Review of patient's allergies indicates no known allergies.   LABORATORY DATA:  Lab Results  Component Value Date   WBC 8.1 12/28/2013   HGB 15.0 12/28/2013   HCT 47.6* 12/28/2013   MCV 90.5 12/28/2013   PLT 240 12/28/2013   Lab Results  Component Value Date   NA 143 12/28/2013   K 3.8 12/28/2013   CL 100 11/04/2013   CO2 28 12/28/2013   Lab Results  Component Value Date   ALT 16 12/28/2013   AST 17 12/28/2013   ALKPHOS 76 12/28/2013   BILITOT 0.24 12/28/2013     NARRATIVE: [name] was seen today for weekly treatment management. The chart was checked and the patient's films were reviewed. The patient is doing well. She has not had any problem with her first treatment. No change in shortness of breath. No skin irritation/change.  PHYSICAL EXAMINATION: weight is 227 lb 11.2 oz (103.284 kg). Her oral temperature is 98 F (36.7 C). Her blood pressure is 138/76 and her pulse is 85. Her respiration is 12 and oxygen saturation is 89%.        ASSESSMENT: The patient is doing satisfactorily with treatment.  PLAN: We will continue  with the patient's radiation treatment as planned.

## 2014-01-26 NOTE — Progress Notes (Signed)
   Radiation Oncology         (336) [phone] ________________________________  Name: [name] MRN: [id number]  Date: 01/26/2014  DOB: Apr 14, 1946  Stereotactic Body Radiotherapy Treatment Procedure Note   NARRATIVE: [name] was brought to the stereotactic radiation treatment machine and placed supine on the CT couch. The patient was set up for stereotactic body radiotherapy on the body fix pillow.   3D TREATMENT PLANNING AND DOSIMETRY: The patient's radiation plan was reviewed and approved prior to starting treatment. It showed 3-dimensional radiation distributions overlaid onto the planning CT. The [facility] for the target structures as well as the organs at risk were reviewed. The documentation of this is filed in the radiation oncology EMR.   SIMULATION VERIFICATION: The patient underwent CT imaging on the treatment unit. These were carefully aligned to document that the ablative radiation dose would cover the target volume and maximally spare the nearby organs at risk according to the planned distribution.   SPECIAL TREATMENT PROCEDURE: [name] received high dose ablative stereotactic body radiotherapy to the planned target volume without unforeseen complications. Treatment was delivered uneventfully. The high doses associated with stereotactic body radiotherapy and the significant potential risks require careful treatment set up and patient monitoring constituting a special treatment procedure.   STEREOTACTIC TREATMENT MANAGEMENT: Following delivery, the patient was evaluated clinically. The patient tolerated treatment without significant acute effects, and was discharged to home in stable condition.   Fraction: 1  Dose:  18 Gy  PLAN: Continue treatment as planned.     ________________________________  Jodelle Gross, MD, PhD

## 2014-01-26 NOTE — Progress Notes (Signed)
She rates her pain as a 8 on a scale of 0-10. Pain is over bilateral arms, pt reports the pain is due to having multiple flea bites from her dogs.   Pt complains of itching and burning over arms/bites, Loss of Sleep, Fatigue, Generalized Weakness and Poor Appetite, pt has lost approx. 11 lbs over the last 3 weeks. Pt reports Shortness of Breath when Walking and Stairs and Coughing is Productive and Color of Phlegm is green. Saturation levels were 89% on room air and went up to 93% with encouragement to take deep breaths. Pt is on room air. Pt reports she is on 4L O2 via nasal cannula at home.

## 2014-01-29 ENCOUNTER — Telehealth: Payer: Self-pay | Admitting: Pulmonary Disease

## 2014-01-29 ENCOUNTER — Other Ambulatory Visit: Payer: Self-pay | Admitting: Medical Oncology

## 2014-01-29 DIAGNOSIS — C3490 Malignant neoplasm of unspecified part of unspecified bronchus or lung: Secondary | ICD-10-CM

## 2014-01-29 MED ORDER — OXYCODONE-ACETAMINOPHEN 5-325 MG PO TABS: 1.0000 | ORAL_TABLET | Freq: Four times a day (QID) | ORAL | Status: DC | PRN

## 2014-01-29 NOTE — Telephone Encounter (Signed)
Refill locked in injection room.

## 2014-01-29 NOTE — Telephone Encounter (Signed)
ATC home # line rang numerous times and no answer Called alternate # and fast busy signal wcb

## 2014-01-29 NOTE — Telephone Encounter (Signed)
Called home #-line rang several times and no VM WCB Called alternate #-received fast busy signal x 3 wcb

## 2014-01-30 ENCOUNTER — Encounter: Payer: Self-pay | Admitting: Radiation Oncology

## 2014-01-30 ENCOUNTER — Ambulatory Visit
Admission: RE | Admit: 2014-01-30 | Discharge: 2014-01-30 | Disposition: A | Discharge status: Home / Self Care | Payer: Medicare Other | Source: Ambulatory Visit | Attending: Radiation Oncology | Admitting: Radiation Oncology

## 2014-01-30 DIAGNOSIS — Z510 Encounter for antineoplastic radiation therapy: Secondary | ICD-10-CM | POA: Diagnosis not present

## 2014-01-30 MED ORDER — TIOTROPIUM BROMIDE MONOHYDRATE 2.5 MCG/ACT IN AERS: 2.0000 | INHALATION_SPRAY | Freq: Every day | RESPIRATORY_TRACT | Status: DC

## 2014-01-30 NOTE — Telephone Encounter (Signed)
Spoke with pt and she needs refill on spiriva. Rx sent. Morriston Bing, CMA

## 2014-01-30 NOTE — Progress Notes (Signed)
  Radiation Oncology         [phone] ________________________________  Name: [name] MRN: [id number]  Date: 01/30/2014  DOB: 1946/06/13  Stereotactic Body Radiotherapy Treatment Procedure Note  NARRATIVE:  [name] was brought to the stereotactic radiation treatment machine and placed supine on the CT couch. The patient was set up for stereotactic body radiotherapy on the body fix pillow.  3D TREATMENT PLANNING AND DOSIMETRY:  The patient's radiation plan was reviewed and approved prior to starting treatment.  It showed 3-dimensional radiation distributions overlaid onto the planning CT.  The [facility] for the target structures as well as the organs at risk were reviewed. The documentation of this is filed in the radiation oncology EMR.  SIMULATION VERIFICATION:  The patient underwent CT imaging on the treatment unit.  These were carefully aligned to document that the ablative radiation dose would cover the left lung target volume and maximally spare the nearby organs at risk according to the planned distribution.  SPECIAL TREATMENT PROCEDURE: [name] received high dose ablative stereotactic body radiotherapy to the planned target volume without unforeseen complications. Treatment was delivered uneventfully. The high doses associated with stereotactic body radiotherapy and the significant potential risks require careful treatment set up and patient monitoring constituting a special treatment procedure   STEREOTACTIC TREATMENT MANAGEMENT:  Following delivery, the patient was evaluated clinically. The patient tolerated treatment without significant acute effects, and was discharged to home in stable condition.    PLAN: Continue treatment as planned.  ------------------------------------------------        Rexene Edison, MD

## 2014-02-01 ENCOUNTER — Ambulatory Visit (INDEPENDENT_AMBULATORY_CARE_PROVIDER_SITE_OTHER): Payer: Medicare Other | Admitting: Surgery

## 2014-02-02 ENCOUNTER — Ambulatory Visit
Admission: RE | Admit: 2014-02-02 | Discharge: 2014-02-02 | Disposition: A | Discharge status: Home / Self Care | Payer: Medicare Other | Source: Ambulatory Visit | Attending: Radiation Oncology | Admitting: Radiation Oncology

## 2014-02-02 ENCOUNTER — Encounter: Payer: Self-pay | Admitting: Radiation Oncology

## 2014-02-02 VITALS — BP 116/75 | HR 90 | Temp 99.4°F | Resp 24 | Wt 223.3 lb

## 2014-02-02 DIAGNOSIS — C3410 Malignant neoplasm of upper lobe, unspecified bronchus or lung: Secondary | ICD-10-CM

## 2014-02-02 DIAGNOSIS — Z510 Encounter for antineoplastic radiation therapy: Secondary | ICD-10-CM | POA: Diagnosis not present

## 2014-02-02 NOTE — Progress Notes (Signed)
She rates her pain as a 3 on a scale of 0-10. Pt complains of, Fatigue, Generalized Weakness, Poor Appetite, Pain is Throbbing and Pain Occurs -Constantly. Shortness of Breath when Walking and Stairs and Coughing is Productive and Color of Phlegm- grey. Pt is on room air. Pt reports wearing 4L O2 via nasal cannula when at home.  Saturations were only 84% on room air, went up to 91% when deep breaths were encouraged.  Pt denies dysphagia. The patient does not eat regular meals due to disinterest in food.  Pt reports after last treatment she had a "knot" in her Right arm which moved into her stomach, the pain increased during movement rating it a "20."  She took two Advil and went to sleep, upon waking up the pain was gone.  She reports the pain returned but not as intense later in the day.  She reports the following day, she had pain over the left side.

## 2014-02-02 NOTE — Progress Notes (Signed)
   Radiation Oncology         [phone] ________________________________  Name: FRANSHESKA WILLINGHAM MRN: [id number]  Date: 02/02/2014  DOB: 04-05-1946  Stereotactic Body Radiotherapy Treatment Procedure Note   NARRATIVE: LASUNDRA HASCALL was brought to the stereotactic radiation treatment machine and placed supine on the CT couch. The patient was set up for stereotactic body radiotherapy on the body fix pillow.   3D TREATMENT PLANNING AND DOSIMETRY: The patient's radiation plan was reviewed and approved prior to starting treatment. It showed 3-dimensional radiation distributions overlaid onto the planning CT. The [facility] for the target structures as well as the organs at risk were reviewed. The documentation of this is filed in the radiation oncology EMR.   SIMULATION VERIFICATION: The patient underwent CT imaging on the treatment unit. These were carefully aligned to document that the ablative radiation dose would cover the target volume and maximally spare the nearby organs at risk according to the planned distribution.   SPECIAL TREATMENT PROCEDURE: [name] received high dose ablative stereotactic body radiotherapy to the planned target volume without unforeseen complications. Treatment was delivered uneventfully. The high doses associated with stereotactic body radiotherapy and the significant potential risks require careful treatment set up and patient monitoring constituting a special treatment procedure.   STEREOTACTIC TREATMENT MANAGEMENT: Following delivery, the patient was evaluated clinically. The patient tolerated treatment without significant acute effects, and was discharged to home in stable condition.   Fraction: 3  Dose:  54 Gy  PLAN: Followup in one month   ________________________________  Jodelle Gross, MD, PhD

## 2014-02-18 NOTE — Addendum Note (Signed)
Encounter addended by: Marye Round, MD on: 02/18/2014 11:28 AM<BR>     Documentation filed: Notes Section, Visit Diagnoses

## 2014-02-18 NOTE — Progress Notes (Signed)
[facility] Simulation and Treatment Planning Note   Name:  [name] MRN: [id number]   Date: 01/17/2014  DOB: 12-04-1945  Status:outpatient    DIAGNOSIS: The encounter diagnosis was Malignant neoplasm of upper lobe, bronchus or lung.  SITE:  Left upper lobe   CONSENT VERIFIED:yes   SET UP: Patient is setup supine   IMMOBILIZATION: The patient was immobilized using a Vac Loc bag, and a customized active form device was also constructed to aid in patient immobilization. The patient set up also involved abdominal compression to attempt to reduce respiratory motion. A total of 2 complex treatment devices therefore will be used for immobilization during the course of radiation.    NARRATIVE:The patient was brought to the [place].  Identity was confirmed.  All relevant records and images related to the planned course of therapy were reviewed.  Then, the patient was positioned in a stable reproducible clinical set-up for radiation therapy. Abdominal compression was applied by me.  4D CT images were obtained and reproducible breathing pattern was confirmed. Free breathing CT images were obtained.  Skin markings were placed.  The CT images were loaded into the planning software where the target and avoidance structures were contoured.  The radiation prescription was entered and confirmed.    TREATMENT PLANNING NOTE:  Treatment planning then occurred. I have requested : MLC's, 3D simulation/ isodose plan, basic dose calculation. It is anticipated that 3 customized fields will be used for the patient's treatment, with each of these corresponding to an additional complex treatment device.  3 dimensional simulation is performed and dose volume histogram of the gross tumor volume, planning tumor volume and criticial normal structures including the spinal cord and lungs were analyzed and requested.  Special treatment procedure was  performed due to high dose per fraction and the complexity of the planning process.  The patient will be monitored for increased risk of toxicity.  Daily imaging using cone beam CT/ MV CT will be used for target localization.   PLAN:  The patient will receive 54 Gy in 3 fractions.    ________________________________   Jodelle Gross, MD, PhD

## 2014-02-18 NOTE — Progress Notes (Signed)
  Radiation Oncology         [phone] ________________________________  Name: [name] MRN: [id number]  Date: 02/02/2014  DOB: 1946/02/09  End of Treatment Note  Diagnosis:   Stage I non-small cell lung cancer     Indication for treatment:  Curative       Radiation treatment dates:   01/26/2014 through 02/02/2014  Site/dose:   The patient was treated to a dose of 54 gray in 3 fractions using a course of stereotactic body radiation treatment. This treated the left upper lobe tumor using a 3-D conformal technique. The patient's treatment consisted of 3 customized fields.  Narrative: The patient tolerated radiation treatment relatively well.   She exhibited no significant toxicity during treatment.  Plan: The patient has completed radiation treatment. The patient will return to radiation oncology clinic for routine followup in one month. I advised the patient to call or return sooner if they have any questions or concerns related to their recovery or treatment. ________________________________  Jodelle Gross, M.D., Ph.D.

## 2014-02-22 ENCOUNTER — Other Ambulatory Visit: Payer: Self-pay | Admitting: Medical Oncology

## 2014-02-22 NOTE — Telephone Encounter (Signed)
Asking for me pain med refill . Per Dr [name] I instructed pt to see PCP for pain management.

## 2014-03-01 ENCOUNTER — Ambulatory Visit: Payer: Medicare Other | Admitting: Adult Health

## 2014-03-13 ENCOUNTER — Encounter: Payer: Self-pay | Admitting: Radiation Oncology

## 2014-03-15 ENCOUNTER — Ambulatory Visit: Admission: RE | Admit: 2014-03-15 | Payer: Medicare Other | Source: Ambulatory Visit | Admitting: Radiation Oncology

## 2014-03-15 HISTORY — DX: Personal history of irradiation: Z92.3

## 2014-04-04 ENCOUNTER — Other Ambulatory Visit: Payer: Medicare Other

## 2014-04-18 ENCOUNTER — Ambulatory Visit: Payer: Medicare Other | Admitting: Infectious Disease

## 2014-05-02 ENCOUNTER — Other Ambulatory Visit (HOSPITAL_COMMUNITY)
Admission: RE | Admit: 2014-05-02 | Discharge: 2014-05-02 | Disposition: A | Discharge status: Home / Self Care | Payer: Medicare Other | Source: Ambulatory Visit | Attending: Infectious Disease | Admitting: Infectious Disease

## 2014-05-02 ENCOUNTER — Other Ambulatory Visit: Payer: Medicare Other

## 2014-05-02 DIAGNOSIS — Z113 Encounter for screening for infections with a predominantly sexual mode of transmission: Secondary | ICD-10-CM | POA: Insufficient documentation

## 2014-05-02 DIAGNOSIS — B20 Human immunodeficiency virus [HIV] disease: Secondary | ICD-10-CM

## 2014-05-02 LAB — CBC WITH DIFFERENTIAL/PLATELET
Basophils Absolute: 0 10*3/uL (ref 0.0–0.1)
Basophils Relative: 0 % (ref 0–1)
Eosinophils Absolute: 0.1 10*3/uL (ref 0.0–0.7)
Eosinophils Relative: 1 % (ref 0–5)
HCT: 44.1 % (ref 36.0–46.0)
HEMOGLOBIN: 14.3 g/dL (ref 12.0–15.0)
LYMPHS ABS: 2.5 10*3/uL (ref 0.7–4.0)
LYMPHS PCT: 33 % (ref 12–46)
MCH: 28.8 pg (ref 26.0–34.0)
MCHC: 32.4 g/dL (ref 30.0–36.0)
MCV: 88.7 fL (ref 78.0–100.0)
MONOS PCT: 4 % (ref 3–12)
Monocytes Absolute: 0.3 10*3/uL (ref 0.1–1.0)
NEUTROS PCT: 62 % (ref 43–77)
Neutro Abs: 4.7 10*3/uL (ref 1.7–7.7)
Platelets: 241 10*3/uL (ref 150–400)
RBC: 4.97 MIL/uL (ref 3.87–5.11)
RDW: 14.3 % (ref 11.5–15.5)
WBC: 7.5 10*3/uL (ref 4.0–10.5)

## 2014-05-02 LAB — COMPLETE METABOLIC PANEL WITH GFR
ALT: 13 U/L (ref 0–35)
AST: 17 U/L (ref 0–37)
Albumin: 4.3 g/dL (ref 3.5–5.2)
Alkaline Phosphatase: 75 U/L (ref 39–117)
BILIRUBIN TOTAL: 0.2 mg/dL (ref 0.2–1.2)
BUN: 9 mg/dL (ref 6–23)
CO2: 32 mEq/L (ref 19–32)
CREATININE: 1.1 mg/dL (ref 0.50–1.10)
Calcium: 8.9 mg/dL (ref 8.4–10.5)
Chloride: 102 mEq/L (ref 96–112)
GFR, EST AFRICAN AMERICAN: 60 mL/min
GFR, Est Non African American: 52 mL/min — ABNORMAL LOW
Glucose, Bld: 93 mg/dL (ref 70–99)
Potassium: 4.2 mEq/L (ref 3.5–5.3)
Sodium: 142 mEq/L (ref 135–145)
Total Protein: 7.8 g/dL (ref 6.0–8.3)

## 2014-05-03 LAB — RPR

## 2014-05-03 LAB — T-HELPER CELL (CD4) - (RCID CLINIC ONLY)
CD4 T CELL ABS: 850 /uL (ref 400–2700)
CD4 T CELL HELPER: 34 % (ref 33–55)

## 2014-05-03 LAB — URINE CYTOLOGY ANCILLARY ONLY
CHLAMYDIA, DNA PROBE: NEGATIVE
NEISSERIA GONORRHEA: NEGATIVE

## 2014-05-05 LAB — HIV-1 RNA QUANT-NO REFLEX-BLD
HIV 1 RNA Quant: 95 copies/mL — ABNORMAL HIGH (ref <20)
HIV-1 RNA QUANT, LOG: 1.98 {Log} — AB (ref <1.30)

## 2014-05-16 ENCOUNTER — Encounter: Payer: Self-pay | Admitting: Infectious Disease

## 2014-05-16 ENCOUNTER — Ambulatory Visit (INDEPENDENT_AMBULATORY_CARE_PROVIDER_SITE_OTHER): Payer: Medicare Other | Admitting: Infectious Disease

## 2014-05-16 VITALS — BP 172/68 | HR 96 | Temp 98.7°F | Wt 227.0 lb

## 2014-05-16 DIAGNOSIS — J441 Chronic obstructive pulmonary disease with (acute) exacerbation: Secondary | ICD-10-CM

## 2014-05-16 DIAGNOSIS — Z79899 Other long term (current) drug therapy: Secondary | ICD-10-CM

## 2014-05-16 DIAGNOSIS — C3490 Malignant neoplasm of unspecified part of unspecified bronchus or lung: Secondary | ICD-10-CM | POA: Insufficient documentation

## 2014-05-16 DIAGNOSIS — Z720 Tobacco use: Secondary | ICD-10-CM

## 2014-05-16 DIAGNOSIS — B20 Human immunodeficiency virus [HIV] disease: Secondary | ICD-10-CM

## 2014-05-16 DIAGNOSIS — F17210 Nicotine dependence, cigarettes, uncomplicated: Secondary | ICD-10-CM

## 2014-05-16 DIAGNOSIS — C3412 Malignant neoplasm of upper lobe, left bronchus or lung: Secondary | ICD-10-CM

## 2014-05-16 NOTE — Progress Notes (Signed)
Subjective:    Patient ID: [name], female    DOB: July 12, 1945, 68 y.o.   MRN: [id number]  HPI   68 year old African American lady with HIV that has been perfectly controlled on Atripla and followed by Dr. Megan Salon having not been seen since January 2014 Until I saw her this spring 2015  She has been found to have an  adenocarcinoma of the left upper lung, [id number]. The patient is not felt to be a good candidate for surgery due to her severe COPD.  She has undergone radiation therapy and the care of Dr. Lisbeth Renshaw. Currently her HIV is well controlled with a viral load has what appears to be slight blip to 95 and  CD4 count but is very healthy.   Lab Results  Component Value Date   HIV1RNAQUANT 95* 05/02/2014   Lab Results  Component Value Date   CD4TABS 850 05/02/2014   CD4TABS 910 11/01/2013   CD4TABS 1610 03/31/2012     Is suffering still from neuropathic pain that has not responding to only 3 times a day gabapentin and I offered to escalate the dose of this.  She had pap smear via Womens HIV study headed up by Dr. Hendricks Milo at [facility] and had following pap:    ASCUS   Review of Systems  Constitutional: Negative for fever, chills, diaphoresis, activity change, appetite change, fatigue and unexpected weight change.  HENT: Negative for congestion and sore throat.   Eyes: Negative for photophobia and visual disturbance.  Respiratory: Positive for shortness of breath. Negative for chest tightness, wheezing and stridor.   Cardiovascular: Negative for chest pain, palpitations and leg swelling.  Gastrointestinal: Negative for nausea, abdominal pain and abdominal distention.  Genitourinary: Negative for flank pain.  Musculoskeletal: Positive for arthralgias. Negative for myalgias, back pain, joint swelling and gait problem.  Skin: Negative for rash and wound.  Neurological: Negative for dizziness, tremors, weakness and light-headedness.  Hematological: Negative for adenopathy. Does  not bruise/bleed easily.  Psychiatric/Behavioral: Negative for behavioral problems, confusion, sleep disturbance, decreased concentration and agitation.       Objective:   Physical Exam  Constitutional: She is oriented to person, place, and time. She appears well-developed and well-nourished. No distress.  HENT:  Head: Normocephalic and atraumatic.  Mouth/Throat: No oropharyngeal exudate.  Eyes: Conjunctivae and EOM are normal. Pupils are equal, round, and reactive to light. No scleral icterus.  Neck: Normal range of motion. Neck supple. No JVD present.  Cardiovascular: Normal rate and regular rhythm.   Pulmonary/Chest: Effort normal. No respiratory distress. She has no wheezes.  Abdominal: She exhibits no distension.  Musculoskeletal: She exhibits no edema or tenderness.  Lymphadenopathy:    She has no cervical adenopathy.  Neurological: She is alert and oriented to person, place, and time. She exhibits normal muscle tone. Coordination normal.  Skin: Skin is warm and dry. She is not diaphoretic. No erythema. No pallor.  Psychiatric: She has a normal mood and affect. Her behavior is normal. Judgment and thought content normal.          Assessment & Plan:   HIV: perfect control Atripla continue this. Recheck VL to make sure she is only having a blip and re-assure her. Consider newer TAF based regimens as they emerge. I spent greater than 25 minutes with the patient including greater than 50% of time in face to face counsel of the patient and in coordination of their care.   Stage I non-small cell lung cancer : not felt to  be surgical candidate sp XRT  Neuropathic pains: on gabapentin  ASCUS: she is to have followup colposcopy  Smoking: smoking again  COPD; supposed to be on O2 and is on this at home

## 2014-05-18 LAB — HIV-1 RNA ULTRAQUANT REFLEX TO GENTYP+
HIV 1 RNA Quant: 181 copies/mL — ABNORMAL HIGH (ref <20)
HIV-1 RNA Quant, Log: 2.26 {Log} — ABNORMAL HIGH (ref <1.30)

## 2014-06-27 ENCOUNTER — Other Ambulatory Visit: Payer: Medicare Other

## 2014-06-27 ENCOUNTER — Ambulatory Visit (HOSPITAL_COMMUNITY): Payer: Medicare Other

## 2014-07-02 ENCOUNTER — Ambulatory Visit: Payer: Medicare Other | Admitting: Internal Medicine

## 2014-07-19 ENCOUNTER — Emergency Department (HOSPITAL_COMMUNITY): Payer: Medicare Other

## 2014-07-19 ENCOUNTER — Emergency Department (HOSPITAL_COMMUNITY)
Admission: EM | Admit: 2014-07-19 | Discharge: 2014-07-19 | Disposition: A | Discharge status: Home / Self Care | Payer: Medicare Other | Attending: Emergency Medicine | Admitting: Emergency Medicine

## 2014-07-19 ENCOUNTER — Encounter (HOSPITAL_COMMUNITY): Payer: Self-pay | Admitting: *Deleted

## 2014-07-19 DIAGNOSIS — Z7951 Long term (current) use of inhaled steroids: Secondary | ICD-10-CM | POA: Insufficient documentation

## 2014-07-19 DIAGNOSIS — M546 Pain in thoracic spine: Secondary | ICD-10-CM | POA: Diagnosis not present

## 2014-07-19 DIAGNOSIS — Z8619 Personal history of other infectious and parasitic diseases: Secondary | ICD-10-CM | POA: Insufficient documentation

## 2014-07-19 DIAGNOSIS — Z720 Tobacco use: Secondary | ICD-10-CM | POA: Diagnosis not present

## 2014-07-19 DIAGNOSIS — Z872 Personal history of diseases of the skin and subcutaneous tissue: Secondary | ICD-10-CM | POA: Diagnosis not present

## 2014-07-19 DIAGNOSIS — G709 Myoneural disorder, unspecified: Secondary | ICD-10-CM | POA: Insufficient documentation

## 2014-07-19 DIAGNOSIS — Z7982 Long term (current) use of aspirin: Secondary | ICD-10-CM | POA: Diagnosis not present

## 2014-07-19 DIAGNOSIS — Z791 Long term (current) use of non-steroidal anti-inflammatories (NSAID): Secondary | ICD-10-CM | POA: Insufficient documentation

## 2014-07-19 DIAGNOSIS — G8929 Other chronic pain: Secondary | ICD-10-CM | POA: Diagnosis not present

## 2014-07-19 DIAGNOSIS — Z85118 Personal history of other malignant neoplasm of bronchus and lung: Secondary | ICD-10-CM | POA: Diagnosis not present

## 2014-07-19 DIAGNOSIS — Z79899 Other long term (current) drug therapy: Secondary | ICD-10-CM | POA: Diagnosis not present

## 2014-07-19 DIAGNOSIS — R52 Pain, unspecified: Secondary | ICD-10-CM

## 2014-07-19 DIAGNOSIS — Z21 Asymptomatic human immunodeficiency virus [HIV] infection status: Secondary | ICD-10-CM | POA: Diagnosis not present

## 2014-07-19 DIAGNOSIS — J449 Chronic obstructive pulmonary disease, unspecified: Secondary | ICD-10-CM | POA: Insufficient documentation

## 2014-07-19 MED ORDER — CYCLOBENZAPRINE HCL 10 MG PO TABS: 10.0000 mg | ORAL_TABLET | Freq: Two times a day (BID) | ORAL | Status: DC | PRN

## 2014-07-19 MED ORDER — NAPROXEN 500 MG PO TABS
500.0000 mg | ORAL_TABLET | Freq: Two times a day (BID) | ORAL | Status: DC
Start: 2014-07-19 — End: 2014-09-15

## 2014-07-19 MED ORDER — HYDROCODONE-ACETAMINOPHEN 5-325 MG PO TABS
1.0000 | ORAL_TABLET | Freq: Once | ORAL | Status: AC
  Administered 2014-07-19: 1 via ORAL
  Filled 2014-07-19: qty 1

## 2014-07-19 NOTE — ED Notes (Signed)
Pt in c/o chronic back pain, states she has been unable to get her normal pain medication filled and is unable to control her pain with OTC medication

## 2014-07-19 NOTE — ED Provider Notes (Signed)
CSN: [id number]     Arrival date & time 07/19/14  1356 History  This chart was scribed for non-physician practitioner, Britt Bottom, NP-C, working with Hoy Morn, MD by Ladene Artist, ED Scribe. This patient was seen in room TR11C/TR11C and the patient's care was started at 2:30 PM.   Chief Complaint  Patient presents with  . Back Pain   The history is provided by the patient. No language interpreter was used.   HPI Comments: [name] is a 69 y.o. female, with a h/o HIV, COPD, hyperlipidemia, asthma, CA, neuropathy, who presents to the Emergency Department complaining of acute exacerbation of chronic back pain for the past 5-6 years. Pt reports that mid to lower back pain feels the same as previous back pain. Pain is non-radiating, worse with movement and improved with leaning forward. She denies fever, urinary or bowel incontinence. She states that she has not gotten her prescription for Percocet refilled since September. Pt states that she has tried Mobic without relief. She states that she went to 3 different health centers today but could not be seen as a walk-in. Pt reports that lung CA has resolved, last treatment in August 2015. She was diagnosed in May 2015. Pt was diagnosed with HIV in 1991, viral load was 20, last CD4 was 900. Pt is compliant with HIV regimen. No h/o IV drug use. No PCP. Past Medical History  Diagnosis Date  . COPD (chronic obstructive pulmonary disease)   . Hyperlipidemia   . HIV (human immunodeficiency virus infection)   . Asthma   . 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 H/O drainage of abscess     left axilla, from left breast  . Hep B w/o coma   . Neuromuscular disorder     fingers/feet neuropathy  . S/P radiation therapy 01/26/14-02/02/14    sbrt  lt upper lung-54Gy/109fx  . Non-small cell lung cancer   . Atypical squamous cells of undetermined significance (ASCUS) on Papanicolaou smear of cervix    Past Surgical History  Procedure  Laterality Date  . Abcess drainage      abcess under Left arm, abcess from L breast  . Thyroid gland removed    . Removal of abnormal cells on uterus     Family History  Problem Relation Age of Onset  . Pneumonia Mother   . Hypertension Sister   . Diabetes Sister   . Cancer Sister     pancreatic and lung  . Cancer Brother     pancreatic  . Cancer Brother     pancreatic   History  Substance Use Topics  . Smoking status: Current Every Day Smoker -- 1.00 packs/day for 40 years    Types: Cigarettes  . Smokeless tobacco: Never Used     Comment: quit date the day she starts her radiation at the [facility]  . Alcohol Use: No     Comment: none in 20 years   OB History    No data available     Review of Systems  Constitutional: Negative for fever.  Musculoskeletal: Positive for back pain.  All other systems reviewed and are negative.  Allergies  Review of patient's allergies indicates no known allergies.  Home Medications   Prior to Admission medications   Medication Sig Start Date End Date Taking? Authorizing Provider  albuterol (PROVENTIL HFA;VENTOLIN HFA) 108 (90 BASE) MCG/ACT inhaler Inhale 1 puff into the lungs 2 (two) times daily.     Historical  Provider, MD  albuterol (PROVENTIL) (2.5 MG/3ML) 0.083% nebulizer solution Take 2.5 mg by nebulization 3 (three) times daily.    Historical Provider, MD  aspirin EC 81 MG tablet Take 81 mg by mouth daily.    Historical Provider, MD  Calcium Carbonate-Vitamin D (CALCIUM 600+D) 600-400 MG-UNIT per tablet Take 1 tablet by mouth daily.    Historical Provider, MD  efavirenz-emtricitabine-tenofovir (ATRIPLA) [phone] MG per tablet Take 1 tablet by mouth at bedtime. 01/15/14   Truman Hayward, MD  Fluticasone-Salmeterol (ADVAIR) 500-50 MCG/DOSE AEPB Inhale 1 puff into the lungs every 12 (twelve) hours.    Historical Provider, MD  gabapentin (NEURONTIN) 300 MG capsule Take 2 capsules (600 mg total) by mouth 3 (three) times  daily. 01/15/14   Truman Hayward, MD  loratadine (CLARITIN) 10 MG tablet Take 10 mg by mouth daily.    Historical Provider, MD  meloxicam (MOBIC) 15 MG tablet Take 15 mg by mouth daily. 10/02/13   Historical Provider, MD  montelukast (SINGULAIR) 10 MG tablet Take 1 tablet (10 mg total) by mouth at bedtime. 12/02/12   Michel Bickers, MD  nicotine (NICODERM CQ - DOSED IN MG/24 HOURS) 21 mg/24hr patch Place 1 patch (21 mg total) onto the skin daily. 11/06/13   Costin Karlyne Greenspan, MD  oxyCODONE-acetaminophen (PERCOCET/ROXICET) 5-325 MG per tablet Take 1 tablet by mouth every 6 (six) hours as needed for severe pain (pain). 01/29/14   Carlton Adam, PA-C  Tiotropium Bromide Monohydrate (SPIRIVA RESPIMAT) 2.5 MCG/ACT AERS Inhale 2 puffs into the lungs daily. 01/30/14   Rigoberto Noel, MD  venlafaxine XR (EFFEXOR-XR) 150 MG 24 hr capsule Take 150 mg by mouth daily.    Historical Provider, MD  vitamin B-12 (CYANOCOBALAMIN) 250 MCG tablet Take 250 mcg by mouth daily.    Historical Provider, MD   Triage Vitals: BP 133/72 mmHg  Pulse 85  Temp(Src) 97.9 F (36.6 C) (Oral)  Resp 22  SpO2 99% Physical Exam  Constitutional: She is oriented to person, place, and time. She appears well-developed and well-nourished. No distress.  HENT:  Head: Normocephalic and atraumatic.  Eyes: Conjunctivae and EOM are normal.  Neck: Neck supple.  Cardiovascular: Normal rate.   Pulmonary/Chest: Effort normal.  Musculoskeletal: Normal range of motion.  Tenderness to palpation to bilateral thoracic paraspinous muscles. 5/5 strength in lower extremities bilaterally.  Neurological: She is alert and oriented to person, place, and time.  Skin: Skin is warm and dry.  Psychiatric: She has a normal mood and affect. Her behavior is normal.  Nursing note and vitals reviewed.  ED Course  Procedures (including critical care time) DIAGNOSTIC STUDIES: Oxygen Saturation is 99% on RA, normal by my interpretation.    COORDINATION OF  CARE: 2:39 PM-Discussed treatment plan which includes XR and Vicodin with pt at bedside and pt agreed to plan.   Labs Review Labs Reviewed - No data to display  Imaging Review Dg Thoracic Spine 2 View  07/19/2014   CLINICAL DATA:  Intermittent back pain for 5 years. No known injury.  EXAM: THORACIC SPINE - 2 VIEW  COMPARISON:  CT chest 11/03/2013.  FINDINGS: Convex left curvature is likely positional. No fracture or malalignment is identified. Intervertebral disc space height is maintained thoracic spine. There is some loss of disc space height and endplate spurring in the cervical spine at C5-6 and C6-7. Paraspinous structures are unremarkable.  IMPRESSION: Normal-appearing thoracic spine.  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   Electronically Signed   By:  Inge Rise M.D.   On: 07/19/2014 15:11    EKG Interpretation None      MDM   Final diagnoses:  Pain  Bilateral thoracic back pain   69 yo  with persistent, unchanged back pain.  She reports she is normally prescribed percocet for back pain but has been out due to change with her Medicaid/Medicare but she has recently had these re-instated.  Her pain is isolated only to her back and does not radiate to her legs. She has noneurological deficits and normal neuro exam.  She has no difficulty walking and no loss of bowel or bladder control, no fever, night sweats, weight loss or IVDU. She does have a history of lung cancer but reports this is resolved.  Discussed case with Dr. Venora Maples.  Xray done to evaluate for fracture or any osseous lesions and is negative. RICE protocol and pain medicine indicated and discussed with patient. Resources provided to establish care with PCP and ortho if pain continues.  Pt aware of plana and in agreement. Return precautions provided.     I personally performed the services described in this documentation, which was scribed in my presence. The recorded information has been reviewed and is accurate.  Filed  Vitals:   07/19/14 1402  BP: 133/72  Pulse: 85  Temp: 97.9 F (36.6 C)  TempSrc: Oral  Resp: 22  SpO2: 99%   Meds given in ED:  Medications  HYDROcodone-acetaminophen (NORCO/VICODIN) 5-325 MG per tablet 1 tablet (1 tablet Oral Given 07/19/14 1453)    Discharge Medication List as of 07/19/2014  3:24 PM    START taking these medications   Details  cyclobenzaprine (FLEXERIL) 10 MG tablet Take 1 tablet (10 mg total) by mouth 2 (two) times daily as needed for muscle spasms., Starting 07/19/2014, Until Discontinued, Print    naproxen (NAPROSYN) 500 MG tablet Take 1 tablet (500 mg total) by mouth 2 (two) times daily., Starting 07/19/2014, Until Discontinued, Print             Britt Bottom, NP 07/20/14 [place], MD 07/20/14 [phone]

## 2014-07-19 NOTE — ED Notes (Addendum)
PT reports chronic back  Pain for 5 years. and has been out of pain meds  Since SEPT. Pt also reports a hx Of lung Cancer.Pt does not have a PCP. Pt reports she went to 3 health centers today unable to be seen as a walk in.

## 2014-07-19 NOTE — Discharge Instructions (Signed)
Follow the directions provided. Is important to follow-up with  Her primary care doctor or the orthopedic doctor for further management of your back pain. Please take both the anti-inflammatory medicine and the muscle relaxant medicine twice a day daily to help with your pain. Don't hesitate to return for any new, worsening, or concerning symptoms.   SEEK IMMEDIATE MEDICAL CARE IF:  You have pain that radiates from your back into your legs.  You develop new bowel or bladder control problems.  You have unusual weakness or numbness in your arms or legs.  You develop nausea or vomiting.  You develop abdominal pain.  You feel faint.

## 2014-09-03 ENCOUNTER — Other Ambulatory Visit: Payer: Medicare Other

## 2014-09-15 ENCOUNTER — Emergency Department (HOSPITAL_COMMUNITY): Payer: Medicare Other

## 2014-09-15 ENCOUNTER — Emergency Department (HOSPITAL_COMMUNITY)
Admission: EM | Admit: 2014-09-15 | Discharge: 2014-09-15 | Disposition: A | Discharge status: Home / Self Care | Payer: Medicare Other | Attending: Emergency Medicine | Admitting: Emergency Medicine

## 2014-09-15 ENCOUNTER — Encounter (HOSPITAL_COMMUNITY): Payer: Self-pay

## 2014-09-15 DIAGNOSIS — Z872 Personal history of diseases of the skin and subcutaneous tissue: Secondary | ICD-10-CM | POA: Diagnosis not present

## 2014-09-15 DIAGNOSIS — Z79899 Other long term (current) drug therapy: Secondary | ICD-10-CM | POA: Diagnosis not present

## 2014-09-15 DIAGNOSIS — J441 Chronic obstructive pulmonary disease with (acute) exacerbation: Secondary | ICD-10-CM | POA: Diagnosis not present

## 2014-09-15 DIAGNOSIS — R079 Chest pain, unspecified: Secondary | ICD-10-CM | POA: Diagnosis present

## 2014-09-15 DIAGNOSIS — Z791 Long term (current) use of non-steroidal anti-inflammatories (NSAID): Secondary | ICD-10-CM | POA: Diagnosis not present

## 2014-09-15 DIAGNOSIS — R0789 Other chest pain: Secondary | ICD-10-CM | POA: Diagnosis not present

## 2014-09-15 DIAGNOSIS — G629 Polyneuropathy, unspecified: Secondary | ICD-10-CM | POA: Diagnosis not present

## 2014-09-15 DIAGNOSIS — R062 Wheezing: Secondary | ICD-10-CM | POA: Insufficient documentation

## 2014-09-15 DIAGNOSIS — Z7982 Long term (current) use of aspirin: Secondary | ICD-10-CM | POA: Diagnosis not present

## 2014-09-15 DIAGNOSIS — B20 Human immunodeficiency virus [HIV] disease: Secondary | ICD-10-CM | POA: Insufficient documentation

## 2014-09-15 DIAGNOSIS — Z720 Tobacco use: Secondary | ICD-10-CM | POA: Diagnosis not present

## 2014-09-15 DIAGNOSIS — Z923 Personal history of irradiation: Secondary | ICD-10-CM | POA: Insufficient documentation

## 2014-09-15 DIAGNOSIS — Z8541 Personal history of malignant neoplasm of cervix uteri: Secondary | ICD-10-CM | POA: Diagnosis not present

## 2014-09-15 DIAGNOSIS — Z85118 Personal history of other malignant neoplasm of bronchus and lung: Secondary | ICD-10-CM | POA: Diagnosis not present

## 2014-09-15 DIAGNOSIS — E785 Hyperlipidemia, unspecified: Secondary | ICD-10-CM | POA: Diagnosis not present

## 2014-09-15 DIAGNOSIS — Z9981 Dependence on supplemental oxygen: Secondary | ICD-10-CM | POA: Insufficient documentation

## 2014-09-15 LAB — CBC
HCT: 41.1 % (ref 36.0–46.0)
Hemoglobin: 12.7 g/dL (ref 12.0–15.0)
MCH: 29.1 pg (ref 26.0–34.0)
MCHC: 30.9 g/dL (ref 30.0–36.0)
MCV: 94.3 fL (ref 78.0–100.0)
Platelets: 206 10*3/uL (ref 150–400)
RBC: 4.36 MIL/uL (ref 3.87–5.11)
RDW: 14.6 % (ref 11.5–15.5)
WBC: 7.7 10*3/uL (ref 4.0–10.5)

## 2014-09-15 LAB — BASIC METABOLIC PANEL
Anion gap: 7 (ref 5–15)
BUN: 17 mg/dL (ref 6–23)
CHLORIDE: 107 mmol/L (ref 96–112)
CO2: 26 mmol/L (ref 19–32)
CREATININE: 1.07 mg/dL (ref 0.50–1.10)
Calcium: 9.3 mg/dL (ref 8.4–10.5)
GFR, EST AFRICAN AMERICAN: 61 mL/min — AB (ref >90)
GFR, EST NON AFRICAN AMERICAN: 53 mL/min — AB (ref >90)
Glucose, Bld: 100 mg/dL — ABNORMAL HIGH (ref 70–99)
Potassium: 4 mmol/L (ref 3.5–5.1)
SODIUM: 140 mmol/L (ref 135–145)

## 2014-09-15 LAB — I-STAT TROPONIN, ED: Troponin i, poc: 0 ng/mL (ref 0.00–0.08)

## 2014-09-15 LAB — BRAIN NATRIURETIC PEPTIDE: B Natriuretic Peptide: 19 pg/mL (ref 0.0–100.0)

## 2014-09-15 MED ORDER — PREDNISONE 20 MG PO TABS
60.0000 mg | ORAL_TABLET | Freq: Once | ORAL | Status: AC
  Administered 2014-09-15: 60 mg via ORAL
  Filled 2014-09-15: qty 3

## 2014-09-15 MED ORDER — IPRATROPIUM-ALBUTEROL 0.5-2.5 (3) MG/3ML IN SOLN
3.0000 mL | Freq: Once | RESPIRATORY_TRACT | Status: AC
  Administered 2014-09-15: 3 mL via RESPIRATORY_TRACT
  Filled 2014-09-15: qty 3

## 2014-09-15 MED ORDER — OXYCODONE-ACETAMINOPHEN 5-325 MG PO TABS
1.0000 | ORAL_TABLET | Freq: Once | ORAL | Status: AC
Start: 2014-09-15 — End: 2014-09-15
  Administered 2014-09-15: 1 via ORAL
  Filled 2014-09-15: qty 1

## 2014-09-15 MED ORDER — METHOCARBAMOL 500 MG PO TABS
500.0000 mg | ORAL_TABLET | Freq: Once | ORAL | Status: AC
  Administered 2014-09-15: 500 mg via ORAL
  Filled 2014-09-15: qty 1

## 2014-09-15 MED ORDER — METHOCARBAMOL 500 MG PO TABS: 500.0000 mg | ORAL_TABLET | Freq: Four times a day (QID) | ORAL | Status: DC | PRN

## 2014-09-15 MED ORDER — NAPROXEN 500 MG PO TABS: 500.0000 mg | ORAL_TABLET | Freq: Two times a day (BID) | ORAL | Status: DC

## 2014-09-15 MED ORDER — PREDNISONE 20 MG PO TABS: 40.0000 mg | ORAL_TABLET | Freq: Every day | ORAL | Status: DC

## 2014-09-15 MED ORDER — OXYCODONE-ACETAMINOPHEN 5-325 MG PO TABS: 1.0000 | ORAL_TABLET | Freq: Four times a day (QID) | ORAL | Status: DC | PRN

## 2014-09-15 MED ORDER — OXYCODONE-ACETAMINOPHEN 5-325 MG PO TABS
1.0000 | ORAL_TABLET | Freq: Once | ORAL | Status: AC
  Administered 2014-09-15: 1 via ORAL
  Filled 2014-09-15: qty 1

## 2014-09-15 NOTE — ED Notes (Signed)
Medication given  For pain.  The pt is waiting on her ride that she will not  Have untiukl around 0830

## 2014-09-15 NOTE — ED Notes (Signed)
The pt  Reports that she has had pain under her lt breast  For 4 days.  She denies being sob    She reports she told  Them that she had not had her hhns  Today.

## 2014-09-15 NOTE — Discharge Instructions (Signed)
Take medications as prescribed.  You can use warm moist heat from a heating pad or hot water bottle over the area to also help with pain.  Follow-up with your doctor for recheck in 3-5 days.  Return to the emergency department for worsening condition or new concerning symptoms.   Chest Wall Pain Chest wall pain is pain felt in or around the chest bones and muscles. It may take up to 6 weeks to get better. It may take longer if you are active. Chest wall pain can happen on its own. Other times, things like germs, injury, coughing, or exercise can cause the pain. HOME CARE   Avoid activities that make you tired or cause pain. Try not to use your chest, belly (abdominal), or side muscles. Do not use heavy weights.  Put ice on the sore area.  Put ice in a plastic bag.  Place a towel between your skin and the bag.  Leave the ice on for 15-20 minutes for the first 2 days.  Only take medicine as told by your doctor. GET HELP RIGHT AWAY IF:   You have more pain or are very uncomfortable.  You have a fever.  Your chest pain gets worse.  You have new problems.  You feel sick to your stomach (nauseous) or throw up (vomit).  You start to sweat or feel lightheaded.  You have a cough with mucus (phlegm).  You cough up blood. MAKE SURE YOU:   Understand these instructions.  Will watch your condition.  Will get help right away if you are not doing well or get worse. Document Released: 12/02/2007 Document Revised: 09/07/2011 Document Reviewed: 02/09/2011 [facility] Patient Information 2015 [place], [place]. This information is not intended to replace advice given to you by your health care provider. Make sure you discuss any questions you have with your health care provider.  Chronic Obstructive Pulmonary Disease Exacerbation  Chronic obstructive pulmonary disease (COPD) is a common lung problem. In COPD, the flow of air from the lungs is limited. COPD exacerbations are times that breathing  gets worse and you need extra treatment. Without treatment they can be life threatening. If they happen often, your lungs can become more damaged. HOME CARE  Do not smoke.  Avoid tobacco smoke and other things that bother your lungs.  If given, take your antibiotic medicine as told. Finish the medicine even if you start to feel better.  Only take medicines as told by your doctor.  Drink enough fluids to keep your pee (urine) clear or pale yellow (unless your doctor has told you not to).  Use a cool mist machine (vaporizer).  If you use oxygen or a machine that turns liquid medicine into a mist (nebulizer), continue to use them as told.  Keep up with shots (vaccinations) as told by your doctor.  Exercise regularly.  Eat healthy foods.  Keep all doctor visits as told. GET HELP RIGHT AWAY IF:  You are very short of breath and it gets worse.  You have trouble talking.  You have bad chest pain.  You have blood in your spit (sputum).  You have a fever.  You keep throwing up (vomiting).  You feel weak, or you pass out (faint).  You feel confused.  You keep getting worse. MAKE SURE YOU:   Understand these instructions.  Will watch your condition.  Will get help right away if you are not doing well or get worse. Document Released: 06/04/2011 Document Revised: 04/05/2013 Document Reviewed: 02/17/2013 [employer]  Patient Information 2015 [place]. This information is not intended to replace advice given to you by your health care provider. Make sure you discuss any questions you have with your health care provider.

## 2014-09-15 NOTE — ED Notes (Addendum)
PER EMS; pt reports central sharp CP that radiates to her back and SOB. Wheezing and tight so ems adm 5mg  albuterol breathing treatment. BP-160/80, HR-98, O2-89% RA

## 2014-09-15 NOTE — ED Notes (Signed)
The pt report s that she is more comfortable after the pain med

## 2014-09-15 NOTE — ED Notes (Signed)
Dr Sharol Given at the bedside

## 2014-09-15 NOTE — ED Provider Notes (Signed)
CSN: [id number]     Arrival date & time 09/15/14  0255 History  This chart was scribed for Michelle Flemings, MD by Eustaquio Maize, ED Scribe. This patient was seen in room [facility] and the patient's care was started at 3:58 AM.    No chief complaint on file.  The history is provided by the patient. No language interpreter was used.     HPI Comments: [name] is a 69 y.o. female brought in by ambulance, who presents to the Emergency Department complaining of left sided chest pain that began approximate 4 days ago. Pt states that the pain was exacerbated 1 day ago. She admits to mild coughing prior to her chest pain starting. She believes that the coughing was due to her seasonal allergies. Pt had breathing treatment and ASA en route without any relief. She is usually on 4L O2 at home. She denies shortness of breathing, painful leg swelling, or any other symtpoms.    Past Medical History  Diagnosis Date  . COPD (chronic obstructive pulmonary disease)   . Hyperlipidemia   . HIV (human immunodeficiency virus infection)   . Asthma   . 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 H/O drainage of abscess     left axilla, from left breast  . Hep B w/o coma   . Neuromuscular disorder     fingers/feet neuropathy  . S/P radiation therapy 01/26/14-02/02/14    sbrt  lt upper lung-54Gy/56fx  . Non-small cell lung cancer   . Atypical squamous cells of undetermined significance (ASCUS) on Papanicolaou smear of cervix    Past Surgical History  Procedure Laterality Date  . Abcess drainage      abcess under Left arm, abcess from L breast  . Thyroid gland removed    . Removal of abnormal cells on uterus     Family History  Problem Relation Age of Onset  . Pneumonia Mother   . Hypertension Sister   . Diabetes Sister   . Cancer Sister     pancreatic and lung  . Cancer Brother     pancreatic  . Cancer Brother     pancreatic   History  Substance Use Topics  . Smoking status: Current Every  Day Smoker -- 1.00 packs/day for 40 years    Types: Cigarettes  . Smokeless tobacco: Never Used     Comment: quit date the day she starts her radiation at the [facility]  . Alcohol Use: No     Comment: none in 20 years   OB History    No data available     Review of Systems  Respiratory: Positive for cough. Negative for shortness of breath.   Cardiovascular: Positive for chest pain. Negative for leg swelling.  All other systems reviewed and are negative.     Allergies  Review of patient's allergies indicates no known allergies.  Home Medications   Prior to Admission medications   Medication Sig Start Date End Date Taking? Authorizing Provider  albuterol (PROVENTIL HFA;VENTOLIN HFA) 108 (90 BASE) MCG/ACT inhaler Inhale 1 puff into the lungs 2 (two) times daily.    Yes Historical Provider, MD  albuterol (PROVENTIL) (2.5 MG/3ML) 0.083% nebulizer solution Take 2.5 mg by nebulization every 4 (four) hours as needed for wheezing or shortness of breath.    Yes Historical Provider, MD  aspirin EC 81 MG tablet Take 81 mg by mouth daily.   Yes Historical Provider, MD  Calcium Carbonate-Vitamin D (CALCIUM 600+D) 600-400  MG-UNIT per tablet Take 1 tablet by mouth daily.   Yes Historical Provider, MD  efavirenz-emtricitabine-tenofovir (ATRIPLA) 600-200-300 MG per tablet Take 1 tablet by mouth at bedtime. 01/15/14  Yes Truman Hayward, MD  Fluticasone-Salmeterol (ADVAIR) 500-50 MCG/DOSE AEPB Inhale 1 puff into the lungs every 12 (twelve) hours.   Yes Historical Provider, MD  gabapentin (NEURONTIN) 300 MG capsule Take 2 capsules (600 mg total) by mouth 3 (three) times daily. 01/15/14  Yes Truman Hayward, MD  loratadine (CLARITIN) 10 MG tablet Take 10 mg by mouth daily.   Yes Historical Provider, MD  meloxicam (MOBIC) 15 MG tablet Take 15 mg by mouth daily. 10/02/13  Yes Historical Provider, MD  montelukast (SINGULAIR) 10 MG tablet Take 1 tablet (10 mg total) by mouth at bedtime. 12/02/12  Yes  Michel Bickers, MD  oxyCODONE-acetaminophen (PERCOCET/ROXICET) 5-325 MG per tablet Take 1 tablet by mouth every 6 (six) hours as needed for severe pain (pain). 01/29/14  Yes Adrena E Johnson, PA-C  Tiotropium Bromide Monohydrate (SPIRIVA RESPIMAT) 2.5 MCG/ACT AERS Inhale 2 puffs into the lungs daily. 01/30/14  Yes Rigoberto Noel, MD  venlafaxine XR (EFFEXOR-XR) 150 MG 24 hr capsule Take 150 mg by mouth daily.   Yes Historical Provider, MD  vitamin B-12 (CYANOCOBALAMIN) 250 MCG tablet Take 250 mcg by mouth daily.   Yes Historical Provider, MD  cyclobenzaprine (FLEXERIL) 10 MG tablet Take 1 tablet (10 mg total) by mouth 2 (two) times daily as needed for muscle spasms. Patient not taking: Reported on 09/15/2014 07/19/14   Britt Bottom, NP  naproxen (NAPROSYN) 500 MG tablet Take 1 tablet (500 mg total) by mouth 2 (two) times daily. Patient not taking: Reported on 09/15/2014 07/19/14   Britt Bottom, NP  nicotine (NICODERM CQ - DOSED IN MG/24 HOURS) 21 mg/24hr patch Place 1 patch (21 mg total) onto the skin daily. Patient not taking: Reported on 09/15/2014 11/06/13   Caren Griffins, MD   Triage Vitals: BP 148/88 mmHg  Pulse 91  Temp(Src) 97.6 F (36.4 C) (Axillary)  Resp 22  SpO2 91%   Physical Exam  Pulmonary/Chest: She has wheezes. She exhibits tenderness (tender to palpation along left chest wall).    ED Course  Procedures (including critical care time)  DIAGNOSTIC STUDIES: Oxygen Saturation is 91% on RA, adequate by my interpretation.    COORDINATION OF CARE: 4:04 AM-Discussed treatment plan which includes  (CXR, CBC panel, CMP, UA) with pt at bedside and pt agreed to plan.   Labs Review Labs Reviewed  BASIC METABOLIC PANEL - Abnormal; Notable for the following:    Glucose, Bld 100 (*)    GFR calc non Af Amer 53 (*)    GFR calc Af Amer 61 (*)    All other components within normal limits  CBC  BRAIN NATRIURETIC PEPTIDE  I-STAT TROPOININ, ED    Imaging Review Dg Chest Port  1 View  09/15/2014   CLINICAL DATA:  Sharp chest pain radiating to the back  EXAM: PORTABLE CHEST - 1 VIEW  COMPARISON:  12/14/2013  FINDINGS: A single AP portable view of the chest demonstrates no focal airspace consolidation or alveolar edema. The lungs are grossly clear. There is no large effusion or pneumothorax. Cardiac and mediastinal contours appear unremarkable.  IMPRESSION: No active disease.   Electronically Signed   By: Andreas Newport M.D.   On: 09/15/2014 04:14     EKG Interpretation   Date/Time:  Saturday September 15 2014 03:03:18 EDT Ventricular  Rate:  87 PR Interval:  167 QRS Duration: 88 QT Interval:  391 QTC Calculation: 470 R Axis:   63 Text Interpretation:  Sinus rhythm Nonspecific T abnrm, anterolateral  leads Confirmed by Vaiden Adames  MD, Francina Beery ([id number]) on 09/15/2014 3:54:35 AM      MDM   Final diagnoses:  None   I personally performed the services described in this documentation, which was scribed in my presence. The recorded information has been reviewed and is accurate.  69 year old female with 4 days of left chest pain worse with deep breathing, cough or palpation.  Pain started after several days of cough.  Patient has history of HIV, COPD.  She still smokes cigarettes.  Remote history of lung cancer.  Pain is reproducible with palpation.  Chest x-ray, EKGs and labs within normal limits.  Some wheezing on exam.  She denies any shortness of breath.  Patient feeling better after pain medication.  Will have her follow-up with her primary care doctor.     Michelle Flemings, MD 09/15/14 [phone]

## 2014-09-17 ENCOUNTER — Ambulatory Visit: Payer: Medicare Other | Admitting: Infectious Disease

## 2014-09-18 ENCOUNTER — Ambulatory Visit: Payer: Medicare Other | Admitting: Infectious Disease

## 2014-09-21 ENCOUNTER — Other Ambulatory Visit: Payer: Self-pay | Admitting: Pulmonary Disease

## 2014-10-02 ENCOUNTER — Other Ambulatory Visit: Payer: Medicare Other

## 2014-10-16 ENCOUNTER — Ambulatory Visit: Payer: Medicare Other | Admitting: Infectious Disease

## 2014-10-26 ENCOUNTER — Other Ambulatory Visit: Payer: Self-pay | Admitting: Pulmonary Disease

## 2014-11-16 ENCOUNTER — Other Ambulatory Visit: Payer: Self-pay | Admitting: Pulmonary Disease

## 2014-12-04 DIAGNOSIS — R7301 Impaired fasting glucose: Secondary | ICD-10-CM | POA: Diagnosis not present

## 2014-12-04 DIAGNOSIS — Z21 Asymptomatic human immunodeficiency virus [HIV] infection status: Secondary | ICD-10-CM | POA: Diagnosis not present

## 2014-12-04 DIAGNOSIS — C3490 Malignant neoplasm of unspecified part of unspecified bronchus or lung: Secondary | ICD-10-CM | POA: Diagnosis not present

## 2014-12-04 DIAGNOSIS — J449 Chronic obstructive pulmonary disease, unspecified: Secondary | ICD-10-CM | POA: Diagnosis not present

## 2014-12-04 DIAGNOSIS — M5136 Other intervertebral disc degeneration, lumbar region: Secondary | ICD-10-CM | POA: Diagnosis not present

## 2014-12-14 ENCOUNTER — Emergency Department (HOSPITAL_COMMUNITY): Payer: Medicare Other

## 2014-12-14 ENCOUNTER — Emergency Department (HOSPITAL_COMMUNITY)
Admission: EM | Admit: 2014-12-14 | Discharge: 2014-12-14 | Disposition: A | Discharge status: Home / Self Care | Payer: Medicare Other | Attending: Emergency Medicine | Admitting: Emergency Medicine

## 2014-12-14 ENCOUNTER — Encounter (HOSPITAL_COMMUNITY): Payer: Self-pay

## 2014-12-14 DIAGNOSIS — R0789 Other chest pain: Secondary | ICD-10-CM | POA: Diagnosis not present

## 2014-12-14 DIAGNOSIS — J449 Chronic obstructive pulmonary disease, unspecified: Secondary | ICD-10-CM | POA: Insufficient documentation

## 2014-12-14 DIAGNOSIS — R079 Chest pain, unspecified: Secondary | ICD-10-CM | POA: Diagnosis not present

## 2014-12-14 DIAGNOSIS — Z923 Personal history of irradiation: Secondary | ICD-10-CM | POA: Diagnosis not present

## 2014-12-14 DIAGNOSIS — Z85118 Personal history of other malignant neoplasm of bronchus and lung: Secondary | ICD-10-CM | POA: Insufficient documentation

## 2014-12-14 DIAGNOSIS — Z791 Long term (current) use of non-steroidal anti-inflammatories (NSAID): Secondary | ICD-10-CM | POA: Diagnosis not present

## 2014-12-14 DIAGNOSIS — Z8742 Personal history of other diseases of the female genital tract: Secondary | ICD-10-CM | POA: Insufficient documentation

## 2014-12-14 DIAGNOSIS — R05 Cough: Secondary | ICD-10-CM | POA: Diagnosis not present

## 2014-12-14 DIAGNOSIS — G629 Polyneuropathy, unspecified: Secondary | ICD-10-CM | POA: Insufficient documentation

## 2014-12-14 DIAGNOSIS — Z7982 Long term (current) use of aspirin: Secondary | ICD-10-CM | POA: Insufficient documentation

## 2014-12-14 DIAGNOSIS — B20 Human immunodeficiency virus [HIV] disease: Secondary | ICD-10-CM | POA: Diagnosis not present

## 2014-12-14 DIAGNOSIS — Z7952 Long term (current) use of systemic steroids: Secondary | ICD-10-CM | POA: Insufficient documentation

## 2014-12-14 DIAGNOSIS — Z79899 Other long term (current) drug therapy: Secondary | ICD-10-CM | POA: Insufficient documentation

## 2014-12-14 DIAGNOSIS — Z9981 Dependence on supplemental oxygen: Secondary | ICD-10-CM | POA: Insufficient documentation

## 2014-12-14 DIAGNOSIS — Z872 Personal history of diseases of the skin and subcutaneous tissue: Secondary | ICD-10-CM | POA: Diagnosis not present

## 2014-12-14 DIAGNOSIS — E785 Hyperlipidemia, unspecified: Secondary | ICD-10-CM | POA: Diagnosis not present

## 2014-12-14 DIAGNOSIS — Z720 Tobacco use: Secondary | ICD-10-CM | POA: Insufficient documentation

## 2014-12-14 LAB — CBC
HCT: 43.8 % (ref 36.0–46.0)
Hemoglobin: 13.5 g/dL (ref 12.0–15.0)
MCH: 29.2 pg (ref 26.0–34.0)
MCHC: 30.8 g/dL (ref 30.0–36.0)
MCV: 94.6 fL (ref 78.0–100.0)
Platelets: 200 10*3/uL (ref 150–400)
RBC: 4.63 MIL/uL (ref 3.87–5.11)
RDW: 15.3 % (ref 11.5–15.5)
WBC: 7.8 10*3/uL (ref 4.0–10.5)

## 2014-12-14 LAB — BASIC METABOLIC PANEL
ANION GAP: 9 (ref 5–15)
BUN: 13 mg/dL (ref 6–20)
CO2: 30 mmol/L (ref 22–32)
CREATININE: 1.09 mg/dL — AB (ref 0.44–1.00)
Calcium: 8.7 mg/dL — ABNORMAL LOW (ref 8.9–10.3)
Chloride: 105 mmol/L (ref 101–111)
GFR calc Af Amer: 59 mL/min — ABNORMAL LOW (ref >60)
GFR, EST NON AFRICAN AMERICAN: 51 mL/min — AB (ref >60)
Glucose, Bld: 111 mg/dL — ABNORMAL HIGH (ref 65–99)
Potassium: 4 mmol/L (ref 3.5–5.1)
SODIUM: 144 mmol/L (ref 135–145)

## 2014-12-14 LAB — I-STAT CG4 LACTIC ACID, ED: LACTIC ACID, VENOUS: 1.2 mmol/L (ref 0.5–2.0)

## 2014-12-14 LAB — I-STAT TROPONIN, ED: Troponin i, poc: 0 ng/mL (ref 0.00–0.08)

## 2014-12-14 MED ORDER — IBUPROFEN 200 MG PO TABS
600.0000 mg | ORAL_TABLET | Freq: Once | ORAL | Status: AC
  Administered 2014-12-14: 600 mg via ORAL
  Filled 2014-12-14: qty 3

## 2014-12-14 MED ORDER — IBUPROFEN 600 MG PO TABS: 600.0000 mg | ORAL_TABLET | Freq: Three times a day (TID) | ORAL | Status: DC | PRN

## 2014-12-14 MED ORDER — OXYCODONE-ACETAMINOPHEN 5-325 MG PO TABS
1.0000 | ORAL_TABLET | Freq: Once | ORAL | Status: AC
  Administered 2014-12-14: 1 via ORAL
  Filled 2014-12-14: qty 1

## 2014-12-14 NOTE — ED Notes (Signed)
Cheek unsuccessful attempt to draw labs.

## 2014-12-14 NOTE — ED Notes (Addendum)
Pt c/o cough w/ chest tightness and headache x 2-3 days.  Denies pain.  Headache and tightness only w/ cough.  Pt is on 4L [place] at home.  Hx of COPD, asthma, and lung CA.  Pt does not receive treatment for lung CA and does not have a Pulmonologist.  Pt reports taking breathing treatments w/o relief.

## 2014-12-14 NOTE — ED Notes (Signed)
Unsuccessful IV attempt. Hanley Seamen CN reports will attempt IV.

## 2014-12-14 NOTE — Discharge Instructions (Signed)

## 2014-12-14 NOTE — ED Provider Notes (Signed)
CSN: [id number]     Arrival date & time 12/14/14  0931 History   First MD Initiated Contact with Patient 12/14/14 [phone]     Chief Complaint  Patient presents with  . Cough  . Chest Pain  . Headache     HPI Patient presents to the emergency department complaining of left-sided chest discomfort that is worse with movement of his left arm.  Denies fevers and chills.  Denies productive cough but has had mild ongoing cough.  He is on 4 L nasal cannula at home for history of COPD.  He denies shortness of breath at this time.  He states pain is left chest is worse when he coughs.  His symptoms are mild to moderate in severity.  He denies lower extremity swelling.  No history DVT or pulmonary embolism.  He denies orthopnea.   Past Medical History  Diagnosis Date  . COPD (chronic obstructive pulmonary disease)   . Hyperlipidemia   . HIV (human immunodeficiency virus infection)   . Asthma   . 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 H/O drainage of abscess     left axilla, from left breast  . Hep B w/o coma   . Neuromuscular disorder     fingers/feet neuropathy  . S/P radiation therapy 01/26/14-02/02/14    sbrt  lt upper lung-54Gy/15f  . Non-small cell lung cancer   . Atypical squamous cells of undetermined significance (ASCUS) on Papanicolaou smear of cervix    Past Surgical History  Procedure Laterality Date  . Abcess drainage      abcess under Left arm, abcess from L breast  . Thyroid gland removed    . Removal of abnormal cells on uterus     Family History  Problem Relation Age of Onset  . Pneumonia Mother   . Hypertension Sister   . Diabetes Sister   . Cancer Sister     pancreatic and lung  . Cancer Brother     pancreatic  . Cancer Brother     pancreatic   History  Substance Use Topics  . Smoking status: Current Every Day Smoker -- 1.00 packs/day for 40 years    Types: Cigarettes  . Smokeless tobacco: Never Used     Comment: quit date the day she starts her  radiation at the [facility] . Alcohol Use: No     Comment: none in 20 years   OB History    No data available     Review of Systems  All other systems reviewed and are negative.     Allergies  Review of patient's allergies indicates no known allergies.  Home Medications   Prior to Admission medications   Medication Sig Start Date End Date Taking? Authorizing Provider  albuterol (PROVENTIL HFA;VENTOLIN HFA) 108 (90 BASE) MCG/ACT inhaler Inhale 1 puff into the lungs 2 (two) times daily.    Yes Historical Provider, MD  albuterol (PROVENTIL) (2.5 MG/3ML) 0.083% nebulizer solution Take 2.5 mg by nebulization every 4 (four) hours as needed for wheezing or shortness of breath.    Yes Historical Provider, MD  Ascorbic Acid (VITAMIN C) 100 MG tablet Take 100 mg by mouth daily.   Yes Historical Provider, MD  aspirin EC 81 MG tablet Take 81 mg by mouth daily.   Yes Historical Provider, MD  B Complex-Biotin-FA (VITAMIN B50 COMPLEX PO) Take 1 tablet by mouth daily.   Yes Historical Provider, MD  Calcium Carbonate-Vitamin D (CALCIUM 600+D) 600-400 MG-UNIT per  tablet Take 1 tablet by mouth daily.   Yes Historical Provider, MD  efavirenz-emtricitabine-tenofovir (ATRIPLA) 600-200-300 MG per tablet Take 1 tablet by mouth at bedtime. 01/15/14  Yes Truman Hayward, MD  Fluticasone-Salmeterol (ADVAIR) 500-50 MCG/DOSE AEPB Inhale 1 puff into the lungs every 12 (twelve) hours.   Yes Historical Provider, MD  gabapentin (NEURONTIN) 300 MG capsule Take 2 capsules (600 mg total) by mouth 3 (three) times daily. 01/15/14  Yes Truman Hayward, MD  loratadine (CLARITIN) 10 MG tablet Take 10 mg by mouth daily.   Yes Historical Provider, MD  meloxicam (MOBIC) 15 MG tablet Take 15 mg by mouth daily with breakfast.  10/02/13  Yes Historical Provider, MD  methocarbamol (ROBAXIN) 500 MG tablet Take 1 tablet (500 mg total) by mouth every 6 (six) hours as needed for muscle spasms. 09/15/14  Yes [name], MD   montelukast (SINGULAIR) 10 MG tablet Take 1 tablet (10 mg total) by mouth at bedtime. 12/02/12  Yes Michel Bickers, MD  naproxen (NAPROSYN) 500 MG tablet Take 1 tablet (500 mg total) by mouth 2 (two) times daily. 09/15/14  Yes [name], MD  oxyCODONE-acetaminophen (PERCOCET/ROXICET) 5-325 MG per tablet Take 1-2 tablets by mouth every 6 (six) hours as needed for severe pain (pain). 09/15/14  Yes [name], MD  SPIRIVA RESPIMAT 2.5 MCG/ACT AERS INHALE 2 PUFFS INTO THE LUNGS DAILY 09/24/14  Yes Rigoberto Noel, MD  venlafaxine XR (EFFEXOR-XR) 150 MG 24 hr capsule Take 150 mg by mouth daily with breakfast.    Yes Historical Provider, MD  VITAMIN A PO Take 1 tablet by mouth daily with breakfast.   Yes Historical Provider, MD  ibuprofen (ADVIL,MOTRIN) 600 MG tablet Take 1 tablet (600 mg total) by mouth every 8 (eight) hours as needed. 12/14/14   Jola Schmidt, MD  nicotine (NICODERM CQ - DOSED IN MG/24 HOURS) 21 mg/24hr patch Place 1 patch (21 mg total) onto the skin daily. Patient not taking: Reported on 09/15/2014 11/06/13   Caren Griffins, MD  predniSONE (DELTASONE) 20 MG tablet Take 2 tablets (40 mg total) by mouth daily. Patient not taking: Reported on 12/14/2014 09/15/14   [name], MD   BP 134/77 mmHg  Pulse 79  Temp(Src) 97.7 F (36.5 C) (Oral)  Resp 18  SpO2 100% Physical Exam  Constitutional: She is oriented to person, place, and time. She appears well-developed and well-nourished. No distress.  HENT:  Head: Normocephalic and atraumatic.  Eyes: EOM are normal.  Neck: Normal range of motion.  Cardiovascular: Normal rate, regular rhythm and normal heart sounds.   Pulmonary/Chest: Effort normal and breath sounds normal.  Mild left lateral chest tenderness.  No rash noted.  No bruising or crepitus.  Abdominal: Soft. She exhibits no distension. There is no tenderness.  Musculoskeletal: Normal range of motion.  Neurological: She is alert and oriented to person, place, and time.  Skin: Skin is  warm and dry.  Psychiatric: She has a normal mood and affect. Judgment normal.  Nursing note and vitals reviewed.   ED Course  Procedures (including critical care time) Labs Review Labs Reviewed  BASIC METABOLIC PANEL - Abnormal; Notable for the following:    Glucose, Bld 111 (*)    Creatinine, Ser 1.09 (*)    Calcium 8.7 (*)    GFR calc non Af Amer 51 (*)    GFR calc Af Amer 59 (*)    All other components within normal limits  CBC  I-STAT TROPOININ, ED  I-STAT CG4 LACTIC ACID, ED    Imaging Review Dg Chest 2 View (if Patient Has Fever And/or Copd)  12/14/2014   CLINICAL DATA:  Cough, congestion, shortness breath, mid chest pain  EXAM: CHEST  2 VIEW  COMPARISON:  09/15/2014  FINDINGS: The heart size and mediastinal contours are within normal limits. Both lungs are clear. The visualized skeletal structures are unremarkable.  IMPRESSION: No active cardiopulmonary disease.   Electronically Signed   By: Kathreen Devoid   On: 12/14/2014 10:27  I personally reviewed the imaging tests through PACS system I reviewed available ER/hospitalization records through the EMR    EKG Interpretation   Date/Time:  Friday December 14 2014 09:44:58 EDT Ventricular Rate:  86 PR Interval:  170 QRS Duration: 91 QT Interval:  384 QTC Calculation: 459 R Axis:   64 Text Interpretation:  Sinus rhythm No significant change was found  Confirmed by Dora Clauss  MD, Honore Wipperfurth ([id number]) on 12/14/2014 11:50:47 AM      MDM   Final diagnoses:  Chest pain, unspecified chest pain type    This seems to be more of a bronchitis with associated chest wall pain.  Doubt cardiac disease.  Doubt ACS.  Doubt PE.  No hypoxia.  O2 sats 98% on 4 L nasal cannula which is his home nasal cannula amount.  Home with anti-inflammatories and primary care follow-up    Jola Schmidt, MD 12/14/14 1343

## 2014-12-14 NOTE — ED Notes (Signed)
Main lab drawing blood 

## 2014-12-14 NOTE — ED Notes (Signed)
IV team at bedside with successful IV insertion but unable to draw sufficient blood draw. Cheek NT at bedside attempting blood draw.

## 2014-12-14 NOTE — ED Notes (Signed)
Hanley Seamen two unsuccessful IV attempt. IV consult in place for difficult IV start with blood draw.

## 2014-12-20 ENCOUNTER — Other Ambulatory Visit: Payer: Self-pay | Admitting: Pulmonary Disease

## 2014-12-20 ENCOUNTER — Other Ambulatory Visit: Payer: Self-pay | Admitting: Infectious Disease

## 2015-01-03 ENCOUNTER — Telehealth: Payer: Self-pay | Admitting: Pulmonary Disease

## 2015-01-03 NOTE — Telephone Encounter (Signed)
Received order from [facility] to D/C oxygen based upon hospital notes. Patient has not been seen in our office since 12/27/13. Patient will need to come in for an OV to determine if he needs o2 or not before we can d/c his oxygen.  Attempted to contact patient to schedule follow up, unable to leave message, no voicemail. WCB

## 2015-01-03 NOTE — Telephone Encounter (Signed)
CLOSED IN ERROR... WILL HOLD IN MY BOX UNTIL COMPLETED

## 2015-01-07 NOTE — Telephone Encounter (Signed)
[email or web address]

## 2015-01-09 NOTE — Telephone Encounter (Signed)
Telephone number listed for patient is wrong number. Sent letter advising patient to call office to schedule appointment. Nothing further needed.

## 2015-01-11 ENCOUNTER — Other Ambulatory Visit: Payer: Self-pay | Admitting: Infectious Disease

## 2015-01-11 DIAGNOSIS — G629 Polyneuropathy, unspecified: Secondary | ICD-10-CM

## 2015-01-14 ENCOUNTER — Telehealth: Payer: Self-pay | Admitting: *Deleted

## 2015-01-14 NOTE — Telephone Encounter (Signed)
Patient requesting refill of gabapentin.  Patient overdue for visit.  RN authorized 1 month refills, left message asking patient to call for an appointment. Landis Gandy, RN

## 2015-01-17 ENCOUNTER — Other Ambulatory Visit: Payer: Self-pay | Admitting: Infectious Disease

## 2015-02-05 ENCOUNTER — Encounter: Payer: Self-pay | Admitting: Gastroenterology

## 2015-02-05 DIAGNOSIS — M5136 Other intervertebral disc degeneration, lumbar region: Secondary | ICD-10-CM | POA: Diagnosis not present

## 2015-02-05 DIAGNOSIS — F17200 Nicotine dependence, unspecified, uncomplicated: Secondary | ICD-10-CM | POA: Diagnosis not present

## 2015-02-05 DIAGNOSIS — R7301 Impaired fasting glucose: Secondary | ICD-10-CM | POA: Diagnosis not present

## 2015-02-05 DIAGNOSIS — J449 Chronic obstructive pulmonary disease, unspecified: Secondary | ICD-10-CM | POA: Diagnosis not present

## 2015-02-05 DIAGNOSIS — Z21 Asymptomatic human immunodeficiency virus [HIV] infection status: Secondary | ICD-10-CM | POA: Diagnosis not present

## 2015-02-05 DIAGNOSIS — M179 Osteoarthritis of knee, unspecified: Secondary | ICD-10-CM | POA: Diagnosis not present

## 2015-02-11 ENCOUNTER — Other Ambulatory Visit: Payer: Self-pay | Admitting: Pulmonary Disease

## 2015-02-15 ENCOUNTER — Other Ambulatory Visit: Payer: Self-pay

## 2015-02-15 ENCOUNTER — Other Ambulatory Visit: Payer: Self-pay | Admitting: Infectious Disease

## 2015-02-15 MED ORDER — EFAVIRENZ-EMTRICITAB-TENOFOVIR 600-200-300 MG PO TABS: 1.0000 | ORAL_TABLET | Freq: Every day | ORAL | Status: DC

## 2015-03-08 ENCOUNTER — Ambulatory Visit: Payer: Medicare Other | Admitting: Pulmonary Disease

## 2015-03-28 ENCOUNTER — Inpatient Hospital Stay (HOSPITAL_COMMUNITY)
Admission: EM | Admit: 2015-03-28 | Discharge: 2015-03-31 | DRG: 193 | Disposition: A | Discharge status: Home / Self Care | Payer: Medicare Other | Attending: Internal Medicine | Admitting: Internal Medicine

## 2015-03-28 ENCOUNTER — Emergency Department (HOSPITAL_COMMUNITY): Payer: Medicare Other

## 2015-03-28 ENCOUNTER — Encounter (HOSPITAL_COMMUNITY): Payer: Self-pay | Admitting: *Deleted

## 2015-03-28 DIAGNOSIS — Z85118 Personal history of other malignant neoplasm of bronchus and lung: Secondary | ICD-10-CM | POA: Diagnosis not present

## 2015-03-28 DIAGNOSIS — A419 Sepsis, unspecified organism: Secondary | ICD-10-CM | POA: Diagnosis not present

## 2015-03-28 DIAGNOSIS — Z79899 Other long term (current) drug therapy: Secondary | ICD-10-CM | POA: Diagnosis not present

## 2015-03-28 DIAGNOSIS — Z7952 Long term (current) use of systemic steroids: Secondary | ICD-10-CM | POA: Diagnosis not present

## 2015-03-28 DIAGNOSIS — J45909 Unspecified asthma, uncomplicated: Secondary | ICD-10-CM | POA: Diagnosis not present

## 2015-03-28 DIAGNOSIS — N390 Urinary tract infection, site not specified: Secondary | ICD-10-CM | POA: Diagnosis present

## 2015-03-28 DIAGNOSIS — J441 Chronic obstructive pulmonary disease with (acute) exacerbation: Secondary | ICD-10-CM | POA: Diagnosis present

## 2015-03-28 DIAGNOSIS — J9601 Acute respiratory failure with hypoxia: Secondary | ICD-10-CM | POA: Diagnosis present

## 2015-03-28 DIAGNOSIS — R51 Headache: Secondary | ICD-10-CM | POA: Diagnosis not present

## 2015-03-28 DIAGNOSIS — F17210 Nicotine dependence, cigarettes, uncomplicated: Secondary | ICD-10-CM | POA: Diagnosis present

## 2015-03-28 DIAGNOSIS — Z79891 Long term (current) use of opiate analgesic: Secondary | ICD-10-CM

## 2015-03-28 DIAGNOSIS — C3490 Malignant neoplasm of unspecified part of unspecified bronchus or lung: Secondary | ICD-10-CM | POA: Diagnosis present

## 2015-03-28 DIAGNOSIS — R0602 Shortness of breath: Secondary | ICD-10-CM | POA: Diagnosis not present

## 2015-03-28 DIAGNOSIS — J189 Pneumonia, unspecified organism: Secondary | ICD-10-CM | POA: Diagnosis not present

## 2015-03-28 DIAGNOSIS — G629 Polyneuropathy, unspecified: Secondary | ICD-10-CM | POA: Diagnosis present

## 2015-03-28 DIAGNOSIS — B20 Human immunodeficiency virus [HIV] disease: Secondary | ICD-10-CM

## 2015-03-28 DIAGNOSIS — J9600 Acute respiratory failure, unspecified whether with hypoxia or hypercapnia: Secondary | ICD-10-CM | POA: Diagnosis not present

## 2015-03-28 DIAGNOSIS — R509 Fever, unspecified: Secondary | ICD-10-CM | POA: Diagnosis not present

## 2015-03-28 DIAGNOSIS — E785 Hyperlipidemia, unspecified: Secondary | ICD-10-CM | POA: Diagnosis not present

## 2015-03-28 DIAGNOSIS — Z21 Asymptomatic human immunodeficiency virus [HIV] infection status: Secondary | ICD-10-CM | POA: Diagnosis not present

## 2015-03-28 DIAGNOSIS — I10 Essential (primary) hypertension: Secondary | ICD-10-CM | POA: Diagnosis not present

## 2015-03-28 DIAGNOSIS — J9690 Respiratory failure, unspecified, unspecified whether with hypoxia or hypercapnia: Secondary | ICD-10-CM | POA: Diagnosis present

## 2015-03-28 DIAGNOSIS — Z7982 Long term (current) use of aspirin: Secondary | ICD-10-CM | POA: Diagnosis not present

## 2015-03-28 DIAGNOSIS — Z720 Tobacco use: Secondary | ICD-10-CM | POA: Diagnosis not present

## 2015-03-28 LAB — URINE MICROSCOPIC-ADD ON

## 2015-03-28 LAB — CBC WITH DIFFERENTIAL/PLATELET
BASOS ABS: 0 10*3/uL (ref 0.0–0.1)
Basophils Relative: 0 %
Eosinophils Absolute: 0 10*3/uL (ref 0.0–0.7)
Eosinophils Relative: 0 %
HEMATOCRIT: 43.4 % (ref 36.0–46.0)
Hemoglobin: 13.5 g/dL (ref 12.0–15.0)
LYMPHS PCT: 14 %
Lymphs Abs: 1.5 10*3/uL (ref 0.7–4.0)
MCH: 29.2 pg (ref 26.0–34.0)
MCHC: 31.1 g/dL (ref 30.0–36.0)
MCV: 93.7 fL (ref 78.0–100.0)
MONO ABS: 0.8 10*3/uL (ref 0.1–1.0)
MONOS PCT: 8 %
NEUTROS ABS: 8.4 10*3/uL — AB (ref 1.7–7.7)
Neutrophils Relative %: 78 %
Platelets: 178 10*3/uL (ref 150–400)
RBC: 4.63 MIL/uL (ref 3.87–5.11)
RDW: 15.5 % (ref 11.5–15.5)
WBC: 10.8 10*3/uL — ABNORMAL HIGH (ref 4.0–10.5)

## 2015-03-28 LAB — MRSA PCR SCREENING: MRSA BY PCR: NEGATIVE

## 2015-03-28 LAB — COMPREHENSIVE METABOLIC PANEL
ALBUMIN: 4.4 g/dL (ref 3.5–5.0)
ALT: 20 U/L (ref 14–54)
ANION GAP: 9 (ref 5–15)
AST: 28 U/L (ref 15–41)
Alkaline Phosphatase: 63 U/L (ref 38–126)
BILIRUBIN TOTAL: 0.5 mg/dL (ref 0.3–1.2)
BUN: 14 mg/dL (ref 6–20)
CO2: 27 mmol/L (ref 22–32)
Calcium: 8.8 mg/dL — ABNORMAL LOW (ref 8.9–10.3)
Chloride: 102 mmol/L (ref 101–111)
Creatinine, Ser: 1.21 mg/dL — ABNORMAL HIGH (ref 0.44–1.00)
GFR calc Af Amer: 52 mL/min — ABNORMAL LOW (ref >60)
GFR calc non Af Amer: 45 mL/min — ABNORMAL LOW (ref >60)
Glucose, Bld: 130 mg/dL — ABNORMAL HIGH (ref 65–99)
POTASSIUM: 4.2 mmol/L (ref 3.5–5.1)
SODIUM: 138 mmol/L (ref 135–145)
TOTAL PROTEIN: 8.6 g/dL — AB (ref 6.5–8.1)

## 2015-03-28 LAB — URINALYSIS, ROUTINE W REFLEX MICROSCOPIC
BILIRUBIN URINE: NEGATIVE
Glucose, UA: NEGATIVE mg/dL
Ketones, ur: NEGATIVE mg/dL
NITRITE: POSITIVE — AB
PROTEIN: NEGATIVE mg/dL
SPECIFIC GRAVITY, URINE: 1.014 (ref 1.005–1.030)
UROBILINOGEN UA: 0.2 mg/dL (ref 0.0–1.0)
pH: 6 (ref 5.0–8.0)

## 2015-03-28 LAB — I-STAT CG4 LACTIC ACID, ED: Lactic Acid, Venous: 1.13 mmol/L (ref 0.5–2.0)

## 2015-03-28 LAB — TSH: TSH: 0.274 u[IU]/mL — ABNORMAL LOW (ref 0.350–4.500)

## 2015-03-28 MED ORDER — ENOXAPARIN SODIUM 60 MG/0.6ML ~~LOC~~ SOLN
50.0000 mg | SUBCUTANEOUS | Status: DC
  Administered 2015-03-28 – 2015-03-30 (×3): 50 mg via SUBCUTANEOUS
  Filled 2015-03-28 (×4): qty 0.6

## 2015-03-28 MED ORDER — METHYLPREDNISOLONE SODIUM SUCC 125 MG IJ SOLR
125.0000 mg | Freq: Once | INTRAMUSCULAR | Status: AC
  Administered 2015-03-28: 125 mg via INTRAVENOUS
  Filled 2015-03-28: qty 2

## 2015-03-28 MED ORDER — IPRATROPIUM-ALBUTEROL 0.5-2.5 (3) MG/3ML IN SOLN
3.0000 mL | RESPIRATORY_TRACT | Status: DC
  Filled 2015-03-28 (×2): qty 3

## 2015-03-28 MED ORDER — SODIUM CHLORIDE 0.9 % IV BOLUS (SEPSIS)
1000.0000 mL | Freq: Once | INTRAVENOUS | Status: AC
  Administered 2015-03-28: 1000 mL via INTRAVENOUS

## 2015-03-28 MED ORDER — METHYLPREDNISOLONE SODIUM SUCC 40 MG IJ SOLR
40.0000 mg | Freq: Three times a day (TID) | INTRAMUSCULAR | Status: DC
  Administered 2015-03-28 – 2015-03-29 (×2): 40 mg via INTRAVENOUS
  Filled 2015-03-28 (×2): qty 1

## 2015-03-28 MED ORDER — VENLAFAXINE HCL ER 150 MG PO CP24
150.0000 mg | ORAL_CAPSULE | Freq: Every day | ORAL | Status: DC
  Administered 2015-03-29 – 2015-03-31 (×3): 150 mg via ORAL
  Filled 2015-03-28 (×3): qty 1

## 2015-03-28 MED ORDER — EFAVIRENZ-EMTRICITAB-TENOFOVIR 600-200-300 MG PO TABS
1.0000 | ORAL_TABLET | Freq: Every day | ORAL | Status: DC
  Administered 2015-03-28 – 2015-03-30 (×3): 1 via ORAL
  Filled 2015-03-28 (×3): qty 1

## 2015-03-28 MED ORDER — SODIUM CHLORIDE 0.9 % IJ SOLN
3.0000 mL | Freq: Two times a day (BID) | INTRAMUSCULAR | Status: DC
  Administered 2015-03-28 – 2015-03-31 (×6): 3 mL via INTRAVENOUS

## 2015-03-28 MED ORDER — OXYCODONE HCL 5 MG PO TABS
5.0000 mg | ORAL_TABLET | ORAL | Status: DC | PRN
  Administered 2015-03-28 – 2015-03-31 (×9): 5 mg via ORAL
  Filled 2015-03-28 (×9): qty 1

## 2015-03-28 MED ORDER — DEXTROSE 5 % IV SOLN
500.0000 mg | INTRAVENOUS | Status: DC
  Administered 2015-03-28 – 2015-03-30 (×3): 500 mg via INTRAVENOUS
  Filled 2015-03-28 (×3): qty 500

## 2015-03-28 MED ORDER — ACETAMINOPHEN 650 MG RE SUPP: 650.0000 mg | Freq: Four times a day (QID) | RECTAL | Status: DC | PRN

## 2015-03-28 MED ORDER — VANCOMYCIN HCL 10 G IV SOLR
2000.0000 mg | Freq: Once | INTRAVENOUS | Status: AC
  Administered 2015-03-28: 2000 mg via INTRAVENOUS
  Filled 2015-03-28: qty 2000

## 2015-03-28 MED ORDER — DEXTROSE 5 % IV SOLN
1.0000 g | INTRAVENOUS | Status: DC
  Administered 2015-03-28 – 2015-03-30 (×3): 1 g via INTRAVENOUS
  Filled 2015-03-28 (×4): qty 10

## 2015-03-28 MED ORDER — NICOTINE 21 MG/24HR TD PT24
21.0000 mg | MEDICATED_PATCH | TRANSDERMAL | Status: DC
  Administered 2015-03-28 – 2015-03-30 (×3): 21 mg via TRANSDERMAL
  Filled 2015-03-28 (×4): qty 1

## 2015-03-28 MED ORDER — ONDANSETRON HCL 4 MG PO TABS
4.0000 mg | ORAL_TABLET | Freq: Four times a day (QID) | ORAL | Status: DC | PRN
Start: 2015-03-28 — End: 2015-03-31

## 2015-03-28 MED ORDER — MOMETASONE FURO-FORMOTEROL FUM 200-5 MCG/ACT IN AERO
2.0000 | INHALATION_SPRAY | Freq: Two times a day (BID) | RESPIRATORY_TRACT | Status: DC
  Administered 2015-03-28 – 2015-03-31 (×5): 2 via RESPIRATORY_TRACT
  Filled 2015-03-28: qty 8.8

## 2015-03-28 MED ORDER — ALBUTEROL (5 MG/ML) CONTINUOUS INHALATION SOLN
10.0000 mg/h | INHALATION_SOLUTION | RESPIRATORY_TRACT | Status: DC
  Administered 2015-03-28: 10 mg/h via RESPIRATORY_TRACT

## 2015-03-28 MED ORDER — ACETAMINOPHEN 325 MG PO TABS: 650.0000 mg | ORAL_TABLET | Freq: Four times a day (QID) | ORAL | Status: DC | PRN

## 2015-03-28 MED ORDER — INFLUENZA VAC SPLIT QUAD 0.5 ML IM SUSY
0.5000 mL | PREFILLED_SYRINGE | Freq: Once | INTRAMUSCULAR | Status: AC
  Administered 2015-03-29: 0.5 mL via INTRAMUSCULAR
  Filled 2015-03-28 (×2): qty 0.5

## 2015-03-28 MED ORDER — IPRATROPIUM-ALBUTEROL 0.5-2.5 (3) MG/3ML IN SOLN: 3.0000 mL | RESPIRATORY_TRACT | Status: DC | PRN

## 2015-03-28 MED ORDER — ASPIRIN EC 81 MG PO TBEC
81.0000 mg | DELAYED_RELEASE_TABLET | Freq: Every day | ORAL | Status: DC
Start: 2015-03-28 — End: 2015-03-31
  Administered 2015-03-28 – 2015-03-31 (×4): 81 mg via ORAL
  Filled 2015-03-28 (×4): qty 1

## 2015-03-28 MED ORDER — GABAPENTIN 300 MG PO CAPS
300.0000 mg | ORAL_CAPSULE | Freq: Two times a day (BID) | ORAL | Status: DC
  Administered 2015-03-28 – 2015-03-29 (×2): 300 mg via ORAL
  Filled 2015-03-28 (×3): qty 1

## 2015-03-28 MED ORDER — PIPERACILLIN-TAZOBACTAM 3.375 G IVPB
3.3750 g | Freq: Once | INTRAVENOUS | Status: AC
  Administered 2015-03-28: 3.375 g via INTRAVENOUS
  Filled 2015-03-28: qty 50

## 2015-03-28 MED ORDER — VANCOMYCIN HCL 10 G IV SOLR: 1250.0000 mg | INTRAVENOUS | Status: DC

## 2015-03-28 MED ORDER — SODIUM CHLORIDE 0.9 % IV SOLN
INTRAVENOUS | Status: DC
  Administered 2015-03-28: 1000 mL via INTRAVENOUS
  Administered 2015-03-29: 01:00:00 via INTRAVENOUS

## 2015-03-28 MED ORDER — PIPERACILLIN-TAZOBACTAM 3.375 G IVPB: 3.3750 g | Freq: Three times a day (TID) | INTRAVENOUS | Status: DC

## 2015-03-28 MED ORDER — ONDANSETRON HCL 4 MG/2ML IJ SOLN: 4.0000 mg | Freq: Four times a day (QID) | INTRAMUSCULAR | Status: DC | PRN

## 2015-03-28 MED ORDER — IPRATROPIUM-ALBUTEROL 0.5-2.5 (3) MG/3ML IN SOLN: 3.0000 mL | Freq: Once | RESPIRATORY_TRACT | Status: DC

## 2015-03-28 MED ORDER — CYCLOBENZAPRINE HCL 5 MG PO TABS
5.0000 mg | ORAL_TABLET | Freq: Three times a day (TID) | ORAL | Status: DC | PRN
  Administered 2015-03-28 – 2015-03-31 (×4): 5 mg via ORAL
  Filled 2015-03-28 (×4): qty 1

## 2015-03-28 NOTE — ED Notes (Signed)
Per  EMS, fever and body aches x 4 days. Has been taking Claritin.

## 2015-03-28 NOTE — Care Management Note (Signed)
Case Management Note  Patient Details  Name: [name] MRN: [id number] Date of Birth: [birth year]  Subjective/Objective:                 Copd asthma dyspnea despite outpt treatment   Action/Plan:Date:  Sept. 29, 2016 U.R. performed for needs and level of care. Will continue to follow for Case Management needs.  Velva Harman, RN, BSN, [place]   [phone]   Expected Discharge Date:                  Expected Discharge Plan:  Home/Self Care  In-House Referral:  NA  Discharge planning Services  CM Consult  Post Acute Care Choice:  NA Choice offered to:  NA  DME Arranged:    DME Agency:     HH Arranged:    HH Agency:     Status of Service:  In process, will continue to follow  Medicare Important Message Given:    Date Medicare IM Given:    Medicare IM give by:    Date Additional Medicare IM Given:    Additional Medicare Important Message give by:     If discussed at [place] of Stay Meetings, dates discussed:    Additional Comments:  Leeroy Cha, RN 03/28/2015, 3:04 PM

## 2015-03-28 NOTE — ED Notes (Signed)
Bed: WA06 Expected date:  Expected time:  Means of arrival:  Comments: EMS-fever, body aches

## 2015-03-28 NOTE — Progress Notes (Deleted)
ANTIBIOTIC CONSULT NOTE - INITIAL  Pharmacy Consult for vancomycin, Zosyn Indication: rule out sepsis  No Known Allergies  Patient Measurements: Height: '5\' 9"'$  (175.3 cm) Weight: 222 lb (100.699 kg) IBW/kg (Calculated) : 66.2  Vital Signs: Temp: 100.6 F (38.1 C) (09/29 1426) Temp Source: Oral (09/29 1426) BP: 119/62 mmHg (09/29 1421) Pulse Rate: 110 (09/29 1421) Intake/Output from previous day:   Intake/Output from this shift:    Labs:  Recent Labs  03/28/15 1128  WBC 10.8*  HGB 13.5  PLT 178  CREATININE 1.21*   Estimated Creatinine Clearance: 57 mL/min (by C-G formula based on Cr of 1.21). No results for input(s): VANCOTROUGH, VANCOPEAK, VANCORANDOM, GENTTROUGH, GENTPEAK, GENTRANDOM, TOBRATROUGH, TOBRAPEAK, TOBRARND, AMIKACINPEAK, AMIKACINTROU, AMIKACIN in the last 72 hours.   Microbiology: Recent Results (from the past 720 hour(s))  Blood Culture (routine x 2)     Status: None (Preliminary result)   Collection Time: 03/28/15 11:40 AM  Result Value Ref Range Status   Specimen Description BLOOD RIGHT HAND  Final   Special Requests BOTTLES DRAWN AEROBIC AND ANAEROBIC 5CC  Final   Culture PENDING  Incomplete   Report Status PENDING  Incomplete    Medical History: Past Medical History  Diagnosis Date  . COPD (chronic obstructive pulmonary disease)   . Hyperlipidemia   . HIV (human immunodeficiency virus infection)   . Asthma   . 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 H/O drainage of abscess     left axilla, from left breast  . Hep B w/o coma   . Neuromuscular disorder     fingers/feet neuropathy  . S/P radiation therapy 01/26/14-02/02/14    sbrt  lt upper lung-54Gy/10f  . Non-small cell lung cancer   . Atypical squamous cells of undetermined significance (ASCUS) on Papanicolaou smear of cervix     Medications:  Scheduled:   Infusions:  . albuterol Stopped (03/28/15 1300)  . ipratropium-albuterol     Assessment: 69yo female presented to ER  with shortness of breath with hx HIV, COPD/asthma, NSCLC. To start vancomycin and zosyn per pharmacy for rule out sepsis with possible CAP and UTI. Tmax 100.6, WBC 10.8 and SCr 1.21 with CrCl of 51 N.   Goal of Therapy:  Vancomycin trough level 15-20 mcg/ml  Plan:  1) Vancomycin 1g IV x 1 in ER ~1200. Start vancomycin '1250mg'$  IV q24 based on current weight and renal function 2) Zosyn 3.375g IV q8 (extended interval infusion) for CrCl > 20 ml/min   JAdrian Saran PharmD, BCPS Pager [phone]/29/2016 2:41 PM

## 2015-03-28 NOTE — ED Notes (Signed)
Unsuccessful lab draw by this Probation officer, RN made aware.

## 2015-03-28 NOTE — ED Provider Notes (Signed)
CSN: [id number]     Arrival date & time 03/28/15  1101 History   First MD Initiated Contact with Patient 03/28/15 1113     Chief Complaint  Patient presents with  . Cough  . Fever     (Consider location/radiation/quality/duration/timing/severity/associated sxs/prior Treatment) Patient is a 69 y.o. female presenting with shortness of breath.  Shortness of Breath Severity:  Severe Onset quality:  Gradual Duration:  3 days Timing:  Constant Progression:  Worsening Chronicity:  New Relieved by:  Nothing Worsened by:  Nothing tried Ineffective treatments: breathing treatments only help temporarily. Associated symptoms: cough, fever and sputum production (yellow)   Associated symptoms: no abdominal pain, no chest pain, no diaphoresis, no neck pain, no rash, no sore throat and no vomiting   Risk factors: hx of cancer   Risk factors: no hx of PE/DVT and no prolonged immobilization        Past Medical History  Diagnosis Date  . COPD (chronic obstructive pulmonary disease)   . Hyperlipidemia   . HIV (human immunodeficiency virus infection)   . Asthma   . 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 H/O drainage of abscess     left axilla, from left breast  . Hep B w/o coma   . Neuromuscular disorder     fingers/feet neuropathy  . S/P radiation therapy 01/26/14-02/02/14    sbrt  lt upper lung-54Gy/33f  . Non-small cell lung cancer   . Atypical squamous cells of undetermined significance (ASCUS) on Papanicolaou smear of cervix    Past Surgical History  Procedure Laterality Date  . Abcess drainage      abcess under Left arm, abcess from L breast  . Thyroid gland removed    . Removal of abnormal cells on uterus     Family History  Problem Relation Age of Onset  . Pneumonia Mother   . Hypertension Sister   . Diabetes Sister   . Cancer Sister     pancreatic and lung  . Cancer Brother     pancreatic  . Cancer Brother     pancreatic   Social History  Substance Use  Topics  . Smoking status: Current Every Day Smoker -- 1.00 packs/day for 40 years    Types: Cigarettes  . Smokeless tobacco: Never Used     Comment: [facility] date [facility] . Alcohol Use: No     Comment: none in 20 years   OB History    No data available     Review of Systems  Constitutional: Positive for fever. Negative for diaphoresis.  HENT: Negative for sore throat.   Eyes: Negative for visual disturbance.  Respiratory: Positive for cough, sputum production (yellow) and shortness of breath.   Cardiovascular: Negative for chest pain.  Gastrointestinal: Negative for vomiting and abdominal pain.  Genitourinary: Negative for difficulty urinating.  Musculoskeletal: Positive for myalgias and arthralgias. Negative for back pain and neck pain.  Skin: Negative for rash.  Neurological: Negative for syncope.      Allergies  Review of patient's allergies indicates no known allergies.  Home Medications   Prior to Admission medications   Medication Sig Start Date End Date Taking? Authorizing Provider  albuterol (PROVENTIL HFA;VENTOLIN HFA) 108 (90 BASE) MCG/ACT inhaler Inhale 1 puff into the lungs 2 (two) times daily.    Yes Historical Provider, MD  albuterol (PROVENTIL) (2.5 MG/3ML) 0.083% nebulizer solution Take 2.5 mg by nebulization every 4 (four) hours  as needed for wheezing or shortness of breath.    Yes Historical Provider, MD  Ascorbic Acid (VITAMIN C) 100 MG tablet Take 100 mg by mouth daily.   Yes Historical Provider, MD  aspirin EC 81 MG tablet Take 81 mg by mouth daily.   Yes Historical Provider, MD  B Complex-Biotin-FA (VITAMIN B50 COMPLEX PO) Take 1 tablet by mouth daily.   Yes Historical Provider, MD  Calcium Carbonate-Vitamin D (CALCIUM 600+D) 600-400 MG-UNIT per tablet Take 1 tablet by mouth daily.   Yes Historical Provider, MD  efavirenz-emtricitabine-tenofovir (ATRIPLA) 600-200-300 MG per tablet Take 1 tablet by mouth at  bedtime. 02/15/15  Yes Truman Hayward, MD  Fluticasone-Salmeterol (ADVAIR) 500-50 MCG/DOSE AEPB Inhale 1 puff into the lungs every 12 (twelve) hours.   Yes Historical Provider, MD  gabapentin (NEURONTIN) 300 MG capsule TAKE 2 CAPSULES BY MOUTH THREE TIMES A DAY 01/14/15  Yes Truman Hayward, MD  loratadine (CLARITIN) 10 MG tablet Take 10 mg by mouth daily.   Yes Historical Provider, MD  methocarbamol (ROBAXIN) 500 MG tablet Take 1 tablet (500 mg total) by mouth every 6 (six) hours as needed for muscle spasms. 09/15/14  Yes [name], MD  montelukast (SINGULAIR) 10 MG tablet Take 1 tablet (10 mg total) by mouth at bedtime. 12/02/12  Yes Michel Bickers, MD  naproxen (NAPROSYN) 500 MG tablet Take 1 tablet (500 mg total) by mouth 2 (two) times daily. 09/15/14  Yes [name], MD  oxyCODONE-acetaminophen (PERCOCET/ROXICET) 5-325 MG per tablet Take 1-2 tablets by mouth every 6 (six) hours as needed for severe pain (pain). 09/15/14  Yes [name], MD  SPIRIVA RESPIMAT 2.5 MCG/ACT AERS INHALE 2 PUFFS INTO THE LUNGS DAILY 09/24/14  Yes Rigoberto Noel, MD  venlafaxine XR (EFFEXOR-XR) 150 MG 24 hr capsule Take 150 mg by mouth daily with breakfast.    Yes Historical Provider, MD  VITAMIN A PO Take 1 tablet by mouth daily with breakfast.   Yes Historical Provider, MD  ibuprofen (ADVIL,MOTRIN) 600 MG tablet Take 1 tablet (600 mg total) by mouth every 8 (eight) hours as needed. Patient not taking: Reported on 03/28/2015 12/14/14   Jola Schmidt, MD  nicotine (NICODERM CQ - DOSED IN MG/24 HOURS) 21 mg/24hr patch Place 1 patch (21 mg total) onto the skin daily. Patient not taking: Reported on 09/15/2014 11/06/13   Caren Griffins, MD  predniSONE (DELTASONE) 20 MG tablet Take 2 tablets (40 mg total) by mouth daily. Patient not taking: Reported on 12/14/2014 09/15/14   [name], MD   BP 124/51 mmHg  Pulse 104  Temp(Src) 100.1 F (37.8 C) (Oral)  Resp 16  Ht '5\' 9"'$  (1.753 m)  Wt 225 lb 1.4 oz (102.1 kg)  BMI 33.22  kg/m2  SpO2 97% Physical Exam  Constitutional: She is oriented to person, place, and time. She appears well-developed and well-nourished. She appears distressed.  HENT:  Head: Normocephalic and atraumatic.  Eyes: Conjunctivae and EOM are normal.  Neck: Normal range of motion.  Cardiovascular: Normal rate, regular rhythm, normal heart sounds and intact distal pulses.  Exam reveals no gallop and no friction rub.   No murmur heard. Pulmonary/Chest: She is in respiratory distress. She has wheezes. She has no rales.  Abdominal: Soft. She exhibits no distension. There is no tenderness. There is no guarding.  Musculoskeletal: She exhibits no edema or tenderness.  Neurological: She is alert and oriented to person, place, and time.  Skin: Skin is warm and  dry. No rash noted. She is not diaphoretic. No erythema.  Nursing note and vitals reviewed.   ED Course  Procedures (including critical care time) Labs Review Labs Reviewed  COMPREHENSIVE METABOLIC PANEL - Abnormal; Notable for the following:    Glucose, Bld 130 (*)    Creatinine, Ser 1.21 (*)    Calcium 8.8 (*)    Total Protein 8.6 (*)    GFR calc non Af Amer 45 (*)    GFR calc Af Amer 52 (*)    All other components within normal limits  CBC WITH DIFFERENTIAL/PLATELET - Abnormal; Notable for the following:    WBC 10.8 (*)    Neutro Abs 8.4 (*)    All other components within normal limits  URINALYSIS, ROUTINE W REFLEX MICROSCOPIC (NOT AT [facility]) - Abnormal; Notable for the following:    APPearance CLOUDY (*)    Hgb urine dipstick TRACE (*)    Nitrite POSITIVE (*)    Leukocytes, UA TRACE (*)    All other components within normal limits  URINE MICROSCOPIC-ADD ON - Abnormal; Notable for the following:    Bacteria, UA MANY (*)    All other components within normal limits  CULTURE, BLOOD (ROUTINE X 2)  MRSA PCR SCREENING  CULTURE, BLOOD (ROUTINE X 2)  URINE CULTURE  TSH  T-HELPER CELLS (CD4) COUNT  BASIC METABOLIC PANEL  CBC   I-STAT CG4 LACTIC ACID, ED    Imaging Review Dg Chest Port 1 View  03/28/2015   CLINICAL DATA:  Shortness of breath and fever  EXAM: PORTABLE CHEST 1 VIEW  COMPARISON:  12/14/2014  FINDINGS: Borderline enlargement of the cardiomediastinal silhouette is identified with central vascular prominence. Linear left midlung zone scarring or atelectasis is reidentified. No new focal pulmonary opacity although there is some crowding of the lung markings at the bases which could obscure airspace disease. No pleural effusion. No acute osseous finding.  IMPRESSION: No new focal acute finding allowing for portable technique. If symptoms continue, consider repeat imaging with PA and lateral chest radiographs obtained at full inspiration for better visualization.   Electronically Signed   By: Conchita Paris M.D.   On: 03/28/2015 12:14   I have personally reviewed and evaluated these images and lab results as part of my medical decision-making.   EKG Interpretation   Date/Time:  Thursday March 28 2015 11:25:49 EDT Ventricular Rate:  116 PR Interval:  173 QRS Duration: 85 QT Interval:  334 QTC Calculation: 464 R Axis:   61 Text Interpretation:  Sinus tachycardia SINCE LAST TRACING HEART RATE HAS  INCREASED Confirmed by [facility] MD, ERIN ([id number]) on 03/28/2015 11:29:17  AM      MDM   Final diagnoses:  Acute respiratory failure with hypoxia  COPD exacerbation   70 year old female with a history of HIV with normal cell counts as of November 2015, hypertension, COPD, hyperlipidemia, non-small cell lung cancer presents with concern of 3 days of worsening cough, shortness of breath, body aches and fevers.  Patient is hypoxic to 74% on room air on arrival to the emergency department, tachycardic to 118, with temperature 100.1. Code sepsis was initiated. Patient's oxygen saturations improved after starting a nonrebreather.  He had diffuse wheezing on exam and was placed on a continuous nebulizer.  Patient's chest x-ray does not show any sign of opacities, however history and physical exam concerning for possible pneumonia with COPD exacerbation.  PE is also on differential, however given cough productive of yellow sputum hx less consistent with this  and will treat initially for sepsis/pneumonia/COPD. Given concern of possible immunocompromise, patient was given vancomycin and Zosyn.  However, on further review CD4 have been stable and did not start bactrim as doubt PCP.  Given solumedrol for likely overlying COPD exacerbation.  Lactic acid within normal limits. Possible uti on urinalysis as well.  PT to be admitted to stepdown unit for continued care.  CRITICAL CARE: SEPSIS, HYPOXIA Performed by: Alvino Chapel   Total critical care time: 38mn  Critical care time was exclusive of separately billable procedures and treating other patients.  Critical care was necessary to treat or prevent imminent or life-threatening deterioration.  Critical care was time spent personally by me on the following activities: development of treatment plan with patient and/or surrogate as well as nursing, discussions with consultants, evaluation of patient's response to treatment, examination of patient, obtaining history from patient or surrogate, ordering and performing treatments and interventions, ordering and review of laboratory studies, ordering and review of radiographic studies, pulse oximetry and re-evaluation of patient's condition.   EGareth Morgan MD 03/28/15 [phone]

## 2015-03-28 NOTE — H&P (Signed)
[facility] History and Physical  [name] DGU:[id number] DOB: [birth year] DOA: 03/28/2015  Referring physician:  PCP: [name], MD   Chief Complaint: Shortness of breath  HPI: [name] is a 69 y.o. female with a past medical history of HIV, asthma/COPD, history of non-small cell lung cancer, presenting to the emergency department with complaints of shortness of breath. She reports symptoms started last Monday that began with generalized weakness, malaise, fatigue, poor tolerance to physical exertion. On the following day she developed worsening cough associate with white/yellow sputum production and shortness of breath. She also reported chest pain that worsened with deep inspiration and cough. The last 24 hours she started developing fevers, chills, and cold sweats. She has not been treated with antimicrobial therapy recently. This morning symptoms were severe enough to call EMS. She was brought to the emergency department and found to be hypoxemic having O2 sat of 74% on room air. Chest x-ray on admission did not reveal evidence of acute infiltrate. She was given empiric vancomycin and Zosyn in the emergency room.                                        Review of Systems:  Constitutional:  No weight loss, night sweats, positive for Fevers, chills, fatigue, malaise HEENT:  No headaches, Difficulty swallowing,Tooth/dental problems,Sore throat,  No sneezing, itching, ear ache, nasal congestion, post nasal drip,  Cardio-vascular:  No chest pain, Orthopnea, PND, swelling in lower extremities, anasarca, dizziness, palpitations  GI:  No heartburn, indigestion, abdominal pain, nausea, vomiting, diarrhea, change in bowel habits, loss of appetite  Resp:  Positive for shortness of breath with exertion or at rest, excess mucus, and productive cough, No non-productive cough, No coughing up of blood.No change in color of mucus.No wheezing.No chest wall deformity  Skin:  no rash  or lesions.  GU:  no dysuria, change in color of urine, no urgency or frequency. No flank pain.  Musculoskeletal:  No joint pain or swelling. No decreased range of motion. No back pain.  Psych:  No change in mood or affect. No depression or anxiety. No memory loss.   Past Medical History  Diagnosis Date  . COPD (chronic obstructive pulmonary disease)   . Hyperlipidemia   . HIV (human immunodeficiency virus infection)   . Asthma   . 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 H/O drainage of abscess     left axilla, from left breast  . Hep B w/o coma   . Neuromuscular disorder     fingers/feet neuropathy  . S/P radiation therapy 01/26/14-02/02/14    sbrt  lt upper lung-54Gy/36f  . Non-small cell lung cancer   . Atypical squamous cells of undetermined significance (ASCUS) on Papanicolaou smear of cervix    Past Surgical History  Procedure Laterality Date  . Abcess drainage      abcess under Left arm, abcess from L breast  . Thyroid gland removed    . Removal of abnormal cells on uterus     Social History:  reports that she has been smoking Cigarettes.  She has a 40 pack-year smoking history. She has never used smokeless tobacco. She reports that she does not drink alcohol or use illicit drugs.  No Known Allergies  Family History  Problem Relation Age of Onset  . Pneumonia Mother   . Hypertension Sister   . Diabetes Sister   .  Cancer Sister     pancreatic and lung  . Cancer Brother     pancreatic  . Cancer Brother     pancreatic    Prior to Admission medications   Medication Sig Start Date End Date Taking? Authorizing Provider  albuterol (PROVENTIL HFA;VENTOLIN HFA) 108 (90 BASE) MCG/ACT inhaler Inhale 1 puff into the lungs 2 (two) times daily.    Yes Historical Provider, MD  albuterol (PROVENTIL) (2.5 MG/3ML) 0.083% nebulizer solution Take 2.5 mg by nebulization every 4 (four) hours as needed for wheezing or shortness of breath.    Yes Historical Provider, MD    Ascorbic Acid (VITAMIN C) 100 MG tablet Take 100 mg by mouth daily.   Yes Historical Provider, MD  aspirin EC 81 MG tablet Take 81 mg by mouth daily.   Yes Historical Provider, MD  B Complex-Biotin-FA (VITAMIN B50 COMPLEX PO) Take 1 tablet by mouth daily.   Yes Historical Provider, MD  Calcium Carbonate-Vitamin D (CALCIUM 600+D) 600-400 MG-UNIT per tablet Take 1 tablet by mouth daily.   Yes Historical Provider, MD  efavirenz-emtricitabine-tenofovir (ATRIPLA) 600-200-300 MG per tablet Take 1 tablet by mouth at bedtime. 02/15/15  Yes Truman Hayward, MD  Fluticasone-Salmeterol (ADVAIR) 500-50 MCG/DOSE AEPB Inhale 1 puff into the lungs every 12 (twelve) hours.   Yes Historical Provider, MD  gabapentin (NEURONTIN) 300 MG capsule TAKE 2 CAPSULES BY MOUTH THREE TIMES A DAY 01/14/15  Yes Truman Hayward, MD  loratadine (CLARITIN) 10 MG tablet Take 10 mg by mouth daily.   Yes Historical Provider, MD  methocarbamol (ROBAXIN) 500 MG tablet Take 1 tablet (500 mg total) by mouth every 6 (six) hours as needed for muscle spasms. 09/15/14  Yes [name], MD  montelukast (SINGULAIR) 10 MG tablet Take 1 tablet (10 mg total) by mouth at bedtime. 12/02/12  Yes Michel Bickers, MD  naproxen (NAPROSYN) 500 MG tablet Take 1 tablet (500 mg total) by mouth 2 (two) times daily. 09/15/14  Yes [name], MD  oxyCODONE-acetaminophen (PERCOCET/ROXICET) 5-325 MG per tablet Take 1-2 tablets by mouth every 6 (six) hours as needed for severe pain (pain). 09/15/14  Yes [name], MD  SPIRIVA RESPIMAT 2.5 MCG/ACT AERS INHALE 2 PUFFS INTO THE LUNGS DAILY 09/24/14  Yes Rigoberto Noel, MD  venlafaxine XR (EFFEXOR-XR) 150 MG 24 hr capsule Take 150 mg by mouth daily with breakfast.    Yes Historical Provider, MD  VITAMIN A PO Take 1 tablet by mouth daily with breakfast.   Yes Historical Provider, MD  ibuprofen (ADVIL,MOTRIN) 600 MG tablet Take 1 tablet (600 mg total) by mouth every 8 (eight) hours as needed. Patient not taking: Reported  on 03/28/2015 12/14/14   Jola Schmidt, MD  nicotine (NICODERM CQ - DOSED IN MG/24 HOURS) 21 mg/24hr patch Place 1 patch (21 mg total) onto the skin daily. Patient not taking: Reported on 09/15/2014 11/06/13   Caren Griffins, MD  predniSONE (DELTASONE) 20 MG tablet Take 2 tablets (40 mg total) by mouth daily. Patient not taking: Reported on 12/14/2014 09/15/14   [name], MD   Physical Exam: Filed Vitals:   03/28/15 1150 03/28/15 1156 03/28/15 1200 03/28/15 1215  BP:    127/69  Pulse: 117  115 115  Temp:      Resp: '16  21 18  '$ Height:      Weight:      SpO2: 100% 100% 100% 96%    Wt Readings from Last 3 Encounters:  03/28/15 100.699  kg (222 lb)  05/16/14 102.967 kg (227 lb)  02/02/14 101.288 kg (223 lb 4.8 oz)    General:  Ill-appearing, now on nasal cannula oxygen, no acute distress, able to provide history. Eyes: PERRL, normal lids, irises & conjunctiva ENT: grossly normal hearing, lips & tongue Neck: no LAD, masses or thyromegaly Cardiovascular: Tachycardic, RRR, no m/r/g. No LE edema. Telemetry: SR, no arrhythmias  Respiratory: She has a few bibasilar crackles as well as rhonchi, good air movement, did not appreciate significant wheezes Abdomen: soft, ntnd Skin: no rash or induration seen on limited exam Musculoskeletal: grossly normal tone BUE/BLE Psychiatric: grossly normal mood and affect, speech fluent and appropriate Neurologic: grossly non-focal.          Labs on Admission:  Basic Metabolic Panel:  Recent Labs Lab 03/28/15 1128  NA 138  K 4.2  CL 102  CO2 27  GLUCOSE 130*  BUN 14  CREATININE 1.21*  CALCIUM 8.8*   Liver Function Tests:  Recent Labs Lab 03/28/15 1128  AST 28  ALT 20  ALKPHOS 63  BILITOT 0.5  PROT 8.6*  ALBUMIN 4.4   No results for input(s): LIPASE, AMYLASE in the last 168 hours. No results for input(s): AMMONIA in the last 168 hours. CBC:  Recent Labs Lab 03/28/15 1128  WBC 10.8*  NEUTROABS 8.4*  HGB 13.5  HCT 43.4  MCV  93.7  PLT 178   Cardiac Enzymes: No results for input(s): CKTOTAL, CKMB, CKMBINDEX, TROPONINI in the last 168 hours.  BNP (last 3 results)  Recent Labs  09/15/14 0330  BNP 19.0    ProBNP (last 3 results) No results for input(s): PROBNP in the last 8760 hours.  CBG: No results for input(s): GLUCAP in the last 168 hours.  Radiological Exams on Admission: Dg Chest Port 1 View  03/28/2015   CLINICAL DATA:  Shortness of breath and fever  EXAM: PORTABLE CHEST 1 VIEW  COMPARISON:  12/14/2014  FINDINGS: Borderline enlargement of the cardiomediastinal silhouette is identified with central vascular prominence. Linear left midlung zone scarring or atelectasis is reidentified. No new focal pulmonary opacity although there is some crowding of the lung markings at the bases which could obscure airspace disease. No pleural effusion. No acute osseous finding.  IMPRESSION: No new focal acute finding allowing for portable technique. If symptoms continue, consider repeat imaging with PA and lateral chest radiographs obtained at full inspiration for better visualization.   Electronically Signed   By: Conchita Paris M.D.   On: 03/28/2015 12:14    EKG: Independently reviewed.   Assessment/Plan Principal Problem:   Respiratory failure Active Problems:   CAP (community acquired pneumonia)   Human immunodeficiency virus (HIV) disease   Hypertension   Cigarette smoker   Lung cancer   UTI (lower urinary tract infection)   1. Acute hypoxemic respiratory failure. Evidence by an O2 sat of 74% on room air, presented in respiratory distress, tachycardia secondary to community acquired pneumonia. Chronic obstructive pulmonary disease exacerbation could be contributing as well. Although chest x-ray in the emergency department did not reveal acute infiltrate she presents with clinical signs symptoms consistent with pneumonia. She has not had recent hospitalizations. Will treat with empiric IV antibiotic therapy  with ceftriaxone and azithromycin along with IV steroids. Will admit to the step down unit for close monitoring. 2. Probable community-acquired pneumonia. She presents with clinical signs symptoms consistent with pneumonia having been related sputum production, fevers, chills, pleuritic type chest pain. Will treat with ceftriaxone and azithromycin and  repeat a two-view chest x-ray in a.m. follow-up on blood cultures. Doubt this would be related to PCP as she has a history of well-controlled HIV having CD4 count of 850 on 05/02/2014.  3. Possible chronic obstructive pulmonary disease exacerbation. She presents with acute hypoxemic respiratory failure as I suspect underlying infectious process may have precipitated COPD exacerbation. Will treat with systemic steroids, DuoNeb's, empiric IV antimicrobial therapy. 4. HIV. She has a history of HIV that has been well controlled with Atripla with CD4 counts of 1610, 910, 850 over the past 3 years. Since it has been almost a year since her last C4 count, will check one now. Seems unlikely, though,  that current infection would be due to opportunistic organism having these numbers. Continue Atripla. 5. Chronic obstructive pulmonary disease exacerbation. I suspect underlying infectious process precipitating COPD exacerbation, will treat with systemic steroids and scheduled duo nebs. 6. Urinary tract infection. Urinalysis showing the presence of many bacteria and white blood cells, treating with ceftriaxone. 7.  Hypertension. Blood pressure stable in the emergency department. 8. DVT prophylaxis. SCDs   Code Status: Full code Family Communication: Family not present Disposition Plan: Anticipate patient will require greater than 2 nights hospitalization  Time spent: 70 minutes  Kelvin Cellar [facility] Pager [phone]

## 2015-03-29 ENCOUNTER — Inpatient Hospital Stay (HOSPITAL_COMMUNITY): Payer: Medicare Other

## 2015-03-29 DIAGNOSIS — Z21 Asymptomatic human immunodeficiency virus [HIV] infection status: Secondary | ICD-10-CM | POA: Diagnosis not present

## 2015-03-29 DIAGNOSIS — J441 Chronic obstructive pulmonary disease with (acute) exacerbation: Secondary | ICD-10-CM | POA: Diagnosis not present

## 2015-03-29 DIAGNOSIS — N390 Urinary tract infection, site not specified: Secondary | ICD-10-CM | POA: Diagnosis not present

## 2015-03-29 DIAGNOSIS — J45909 Unspecified asthma, uncomplicated: Secondary | ICD-10-CM | POA: Diagnosis not present

## 2015-03-29 DIAGNOSIS — Z79899 Other long term (current) drug therapy: Secondary | ICD-10-CM | POA: Diagnosis not present

## 2015-03-29 DIAGNOSIS — E785 Hyperlipidemia, unspecified: Secondary | ICD-10-CM | POA: Diagnosis not present

## 2015-03-29 DIAGNOSIS — Z79891 Long term (current) use of opiate analgesic: Secondary | ICD-10-CM | POA: Diagnosis not present

## 2015-03-29 DIAGNOSIS — J9601 Acute respiratory failure with hypoxia: Secondary | ICD-10-CM | POA: Diagnosis not present

## 2015-03-29 DIAGNOSIS — G629 Polyneuropathy, unspecified: Secondary | ICD-10-CM | POA: Diagnosis not present

## 2015-03-29 DIAGNOSIS — Z7952 Long term (current) use of systemic steroids: Secondary | ICD-10-CM | POA: Diagnosis not present

## 2015-03-29 DIAGNOSIS — Z85118 Personal history of other malignant neoplasm of bronchus and lung: Secondary | ICD-10-CM | POA: Diagnosis not present

## 2015-03-29 DIAGNOSIS — Z7982 Long term (current) use of aspirin: Secondary | ICD-10-CM | POA: Diagnosis not present

## 2015-03-29 DIAGNOSIS — I10 Essential (primary) hypertension: Secondary | ICD-10-CM | POA: Diagnosis not present

## 2015-03-29 DIAGNOSIS — J189 Pneumonia, unspecified organism: Secondary | ICD-10-CM | POA: Diagnosis not present

## 2015-03-29 DIAGNOSIS — F17210 Nicotine dependence, cigarettes, uncomplicated: Secondary | ICD-10-CM | POA: Diagnosis not present

## 2015-03-29 LAB — CBC
HEMATOCRIT: 36.9 % (ref 36.0–46.0)
HEMOGLOBIN: 11.6 g/dL — AB (ref 12.0–15.0)
MCH: 29.5 pg (ref 26.0–34.0)
MCHC: 31.4 g/dL (ref 30.0–36.0)
MCV: 93.9 fL (ref 78.0–100.0)
Platelets: 155 10*3/uL (ref 150–400)
RBC: 3.93 MIL/uL (ref 3.87–5.11)
RDW: 15.7 % — ABNORMAL HIGH (ref 11.5–15.5)
WBC: 7.9 10*3/uL (ref 4.0–10.5)

## 2015-03-29 LAB — BASIC METABOLIC PANEL
ANION GAP: 6 (ref 5–15)
BUN: 13 mg/dL (ref 6–20)
CALCIUM: 8 mg/dL — AB (ref 8.9–10.3)
CO2: 25 mmol/L (ref 22–32)
Chloride: 112 mmol/L — ABNORMAL HIGH (ref 101–111)
Creatinine, Ser: 1.08 mg/dL — ABNORMAL HIGH (ref 0.44–1.00)
GFR calc Af Amer: >60 mL/min (ref >60)
GFR calc non Af Amer: 52 mL/min — ABNORMAL LOW (ref >60)
GLUCOSE: 182 mg/dL — AB (ref 65–99)
Potassium: 4.6 mmol/L (ref 3.5–5.1)
Sodium: 143 mmol/L (ref 135–145)

## 2015-03-29 LAB — T-HELPER CELLS (CD4) COUNT (NOT AT ARMC)
CD4 % Helper T Cell: 44 % (ref 33–55)
CD4 T CELL ABS: 280 /uL — AB (ref 400–2700)

## 2015-03-29 MED ORDER — GABAPENTIN 300 MG PO CAPS
300.0000 mg | ORAL_CAPSULE | Freq: Three times a day (TID) | ORAL | Status: DC
  Administered 2015-03-29 – 2015-03-31 (×6): 300 mg via ORAL
  Filled 2015-03-29 (×7): qty 1

## 2015-03-29 MED ORDER — GABAPENTIN 300 MG PO CAPS
300.0000 mg | ORAL_CAPSULE | Freq: Three times a day (TID) | ORAL | Status: DC
  Filled 2015-03-29: qty 1

## 2015-03-29 MED ORDER — PREDNISONE 10 MG PO TABS
60.0000 mg | ORAL_TABLET | Freq: Every day | ORAL | Status: DC
  Administered 2015-03-29 – 2015-03-31 (×3): 60 mg via ORAL
  Filled 2015-03-29 (×2): qty 1
  Filled 2015-03-29: qty 3

## 2015-03-29 NOTE — Progress Notes (Signed)
[facility] PROGRESS NOTE  [name] YNW:[id number] DOB: Jan 29, 1946 DOA: 03/28/2015 PCP: [name], MD  Assessment/Plan: 1. Acute hypoxemic respiratory failure -Evidenced by O2 sat of 74% on room air on presentation -Likely secondary to combination of infectious process and COPD exacerbation -Patient showing significant improvement after administration of systemic steroids, nebulizer treatments, antibiotic therapy. -Plan to transfer her out of stepdown unit today.  2.  Probable community acquired pneumonia. -She presented with clinical signs symptoms consistent with pneumonia, although initial chest x-ray did not show acute infiltrate. Viral infection included in the differential -Pending repeat two-view chest x-ray this morning -Cultures pending as well -Plan to continue empiric IV antibiotic therapy with ceftriaxone and azithromycin  3.  Chronic objective pulmonary disease exacerbation. -As I mentioned above I believe this contributed to acute respiratory failure -She is showing significant clinical improvement today, will transition to oral prednisone -Continue antibiotic therapy.  4.  HIV -Patient has a history of well-controlled HIV on Atripla -CD4 count 815 2015, I think it's unlikely that PCP would be the etiologic agent to his pneumonia -We will continue treating for a community acquire pneumonia.  5.  Urinary tract infection. -Follow-up on urine cultures, continue ceftriaxone  Code Status: Full code Family Communication:  Disposition Plan: We'll transfer to [place] today   Antibiotics:  Ceftriaxone  Azithromycin  HPI/Subjective: [name] is a 69 year old female with a history of HIV, asthma/COPD, history non-small cell lung cancer, admitted to the medicine service on 03/28/2015 when she presented with acute hypoxemic respiratory failure. She had reported generalized weakness, malaise, fatigue, cough with associated sputum production and fevers  prior to presentation. Although initial chest x-ray did not show an obvious infiltrate she was started on empiric IV antibiotic therapy for community-acquired pneumonia. Chronic active pulmonary disease exacerbation felt to be a contributor of respiratory failure as well since she had presence of wheezing on exam. She was started on systemic steroids and nebulizer treatments. On the following morning she reported feeling significantly better.  Objective: Filed Vitals:   03/29/15 0521  BP: 142/91  Pulse: 88  Temp:   Resp: 17    Intake/Output Summary (Last 24 hours) at 03/29/15 0748 Last data filed at 03/29/15 0600  Gross per 24 hour  Intake 3260.42 ml  Output   3800 ml  Net -539.58 ml   Filed Weights   03/28/15 1143 03/28/15 1447  Weight: 100.699 kg (222 lb) 102.1 kg (225 lb 1.4 oz)    Exam:   General:  Patient reports feeling much better this morning she is in no distress overall looks good  Cardiovascular: Regular rate and rhythm normal S1-S2 no murmurs rubs or gallops  Respiratory: Normal respiratory effort, lungs overall clear to auscultation with significant improvement to bilateral wheezing  Abdomen: Soft nontender nondistended  Musculoskeletal: No edema  Data Reviewed: Basic Metabolic Panel:  Recent Labs Lab 03/28/15 1128 03/29/15 0422  NA 138 143  K 4.2 4.6  CL 102 112*  CO2 27 25  GLUCOSE 130* 182*  BUN 14 13  CREATININE 1.21* 1.08*  CALCIUM 8.8* 8.0*   Liver Function Tests:  Recent Labs Lab 03/28/15 1128  AST 28  ALT 20  ALKPHOS 63  BILITOT 0.5  PROT 8.6*  ALBUMIN 4.4   No results for input(s): LIPASE, AMYLASE in the last 168 hours. No results for input(s): AMMONIA in the last 168 hours. CBC:  Recent Labs Lab 03/28/15 1128 03/29/15 0422  WBC 10.8* 7.9  NEUTROABS 8.4*  --   HGB  13.5 11.6*  HCT 43.4 36.9  MCV 93.7 93.9  PLT 178 155   Cardiac Enzymes: No results for input(s): CKTOTAL, CKMB, CKMBINDEX, TROPONINI in the last 168  hours. BNP (last 3 results)  Recent Labs  09/15/14 0330  BNP 19.0    ProBNP (last 3 results) No results for input(s): PROBNP in the last 8760 hours.  CBG: No results for input(s): GLUCAP in the last 168 hours.  Recent Results (from the past 240 hour(s))  Blood Culture (routine x 2)     Status: None (Preliminary result)   Collection Time: 03/28/15 11:40 AM  Result Value Ref Range Status   Specimen Description BLOOD RIGHT HAND  Final   Special Requests BOTTLES DRAWN AEROBIC AND ANAEROBIC 5CC  Final   Culture PENDING  Incomplete   Report Status PENDING  Incomplete  MRSA PCR Screening     Status: None   Collection Time: 03/28/15  3:08 PM  Result Value Ref Range Status   MRSA by PCR NEGATIVE NEGATIVE Final    Comment:        The GeneXpert MRSA Assay (FDA approved for NASAL specimens only), is one component of a comprehensive MRSA colonization surveillance program. It is not intended to diagnose MRSA infection nor to guide or monitor treatment for MRSA infections.      Studies: Dg Chest Port 1 View  03/28/2015   CLINICAL DATA:  Shortness of breath and fever  EXAM: PORTABLE CHEST 1 VIEW  COMPARISON:  12/14/2014  FINDINGS: Borderline enlargement of the cardiomediastinal silhouette is identified with central vascular prominence. Linear left midlung zone scarring or atelectasis is reidentified. No new focal pulmonary opacity although there is some crowding of the lung markings at the bases which could obscure airspace disease. No pleural effusion. No acute osseous finding.  IMPRESSION: No new focal acute finding allowing for portable technique. If symptoms continue, consider repeat imaging with PA and lateral chest radiographs obtained at full inspiration for better visualization.   Electronically Signed   By: Conchita Paris M.D.   On: 03/28/2015 12:14    Scheduled Meds: . aspirin EC  81 mg Oral Daily  . azithromycin  500 mg Intravenous Q24H  . cefTRIAXone (ROCEPHIN)  IV  1 g  Intravenous Q24H  . efavirenz-emtricitabine-tenofovir  1 tablet Oral QHS  . enoxaparin (LOVENOX) injection  50 mg Subcutaneous Q24H  . gabapentin  300 mg Oral BID  . Influenza vac split quadrivalent PF  0.5 mL Intramuscular Once  . methylPREDNISolone (SOLU-MEDROL) injection  40 mg Intravenous 3 times per day  . mometasone-formoterol  2 puff Inhalation BID  . nicotine  21 mg Transdermal Q24H  . sodium chloride  3 mL Intravenous Q12H  . venlafaxine XR  150 mg Oral Daily   Continuous Infusions: . sodium chloride 125 mL/hr at 03/29/15 0111    Principal Problem:   Respiratory failure Active Problems:   CAP (community acquired pneumonia)   Human immunodeficiency virus (HIV) disease   Hypertension   Cigarette smoker   Lung cancer   UTI (lower urinary tract infection)    Time spent: 35 min    Kelvin Cellar  [facility] Pager [phone]. If 7PM-7AM, please contact night-coverage at [email or web address], password [facility] 03/29/2015, 7:48 AM  LOS: 1 day

## 2015-03-30 LAB — BASIC METABOLIC PANEL
ANION GAP: 8 (ref 5–15)
BUN: 18 mg/dL (ref 6–20)
CHLORIDE: 109 mmol/L (ref 101–111)
CO2: 28 mmol/L (ref 22–32)
Calcium: 9 mg/dL (ref 8.9–10.3)
Creatinine, Ser: 0.94 mg/dL (ref 0.44–1.00)
GFR calc Af Amer: >60 mL/min (ref >60)
GLUCOSE: 97 mg/dL (ref 65–99)
POTASSIUM: 4.2 mmol/L (ref 3.5–5.1)
Sodium: 145 mmol/L (ref 135–145)

## 2015-03-30 LAB — CBC
HEMATOCRIT: 39.8 % (ref 36.0–46.0)
HEMOGLOBIN: 12.2 g/dL (ref 12.0–15.0)
MCH: 28.9 pg (ref 26.0–34.0)
MCHC: 30.7 g/dL (ref 30.0–36.0)
MCV: 94.3 fL (ref 78.0–100.0)
Platelets: 211 10*3/uL (ref 150–400)
RBC: 4.22 MIL/uL (ref 3.87–5.11)
RDW: 15.5 % (ref 11.5–15.5)
WBC: 11.9 10*3/uL — ABNORMAL HIGH (ref 4.0–10.5)

## 2015-03-30 MED ORDER — LOPERAMIDE HCL 2 MG PO CAPS
2.0000 mg | ORAL_CAPSULE | ORAL | Status: DC | PRN
  Administered 2015-03-30: 2 mg via ORAL
  Filled 2015-03-30: qty 1

## 2015-03-30 NOTE — Progress Notes (Signed)
[facility] PROGRESS NOTE  [name] OVF:[id number] DOB: 17-Jul-1945 DOA: 03/28/2015 PCP: [name], MD  Assessment/Plan: 1. Acute hypoxemic respiratory failure -Evidenced by O2 sat of 74% on room air on presentation -Likely secondary to combination of infectious process and COPD exacerbation -Patient showing significant improvement after administration of systemic steroids, nebulizer treatments, antibiotic therapy.  2.  Probable community acquired pneumonia. -She presented with clinical signs symptoms consistent with pneumonia, although initial chest x-ray did not show acute infiltrate. Viral infection included in the differential -Repeat CXR performed on 03/29/2015 did not reveal acute infiltrate -Blood cultures showing no growth to date -Could be related to viral PNA -Plan to continue empiric IV antibiotic therapy with ceftriaxone and azithromycin  3.  Chronic objective pulmonary disease exacerbation. -As I mentioned above I believe this contributed to acute respiratory failure -She was transitioned to oral Prednisone on 03/29/2015 -Continue antibiotic therapy.  4.  HIV -Patient has a history of well-controlled HIV on Atripla -CD4 count at 280, decreased from 850 on 05/03/2015. I spoke with her ID physical Dr Tommy Medal who recommended checking vial load and continuing treatment from CAP  5.  Urinary tract infection. -Follow-up on urine cultures, continue ceftriaxone  6.  Diarrhea -She complaints of multiple liquid consistency bowel movements over the past 24 hours, wil check a stoll for Cdiff.   Code Status: Full code Family Communication:  Disposition Plan: We'll transfer to [place] today   Antibiotics:  Ceftriaxone  Azithromycin  HPI/Subjective: [name] is a 69 year old female with a history of HIV, asthma/COPD, history non-small cell lung cancer, admitted to the medicine service on 03/28/2015 when she presented with acute hypoxemic respiratory failure.  She had reported generalized weakness, malaise, fatigue, cough with associated sputum production and fevers prior to presentation. Although initial chest x-ray did not show an obvious infiltrate she was started on empiric IV antibiotic therapy for community-acquired pneumonia. Chronic active pulmonary disease exacerbation felt to be a contributor of respiratory failure as well since she had presence of wheezing on exam. She was started on systemic steroids and nebulizer treatments. On the following morning she reported feeling significantly better.  Objective: Filed Vitals:   03/30/15 0826  BP:   Pulse: 86  Temp:   Resp: 16    Intake/Output Summary (Last 24 hours) at 03/30/15 1116 Last data filed at 03/30/15 0900  Gross per 24 hour  Intake    900 ml  Output      0 ml  Net    900 ml   Filed Weights   03/28/15 1143 03/28/15 1447 03/29/15 1338  Weight: 100.699 kg (222 lb) 102.1 kg (225 lb 1.4 oz) 102 kg (224 lb 13.9 oz)    Exam:   General:  Patient reporting abdominal cramps and diarrhea  Cardiovascular: Regular rate and rhythm normal S1-S2 no murmurs rubs or gallops  Respiratory: Normal respiratory effort, lungs overall clear to auscultation with significant improvement to bilateral wheezing  Abdomen: Soft mild generalized tenderness to palpation  Musculoskeletal: No edema  Data Reviewed: Basic Metabolic Panel:  Recent Labs Lab 03/28/15 1128 03/29/15 0422 03/30/15 0546  NA 138 143 145  K 4.2 4.6 4.2  CL 102 112* 109  CO2 '27 25 28  '$ GLUCOSE 130* 182* 97  BUN '14 13 18  '$ CREATININE 1.21* 1.08* 0.94  CALCIUM 8.8* 8.0* 9.0   Liver Function Tests:  Recent Labs Lab 03/28/15 1128  AST 28  ALT 20  ALKPHOS 63  BILITOT 0.5  PROT 8.6*  ALBUMIN  4.4   No results for input(s): LIPASE, AMYLASE in the last 168 hours. No results for input(s): AMMONIA in the last 168 hours. CBC:  Recent Labs Lab 03/28/15 1128 03/29/15 0422 03/30/15 0546  WBC 10.8* 7.9 11.9*   NEUTROABS 8.4*  --   --   HGB 13.5 11.6* 12.2  HCT 43.4 36.9 39.8  MCV 93.7 93.9 94.3  PLT 178 155 211   Cardiac Enzymes: No results for input(s): CKTOTAL, CKMB, CKMBINDEX, TROPONINI in the last 168 hours. BNP (last 3 results)  Recent Labs  09/15/14 0330  BNP 19.0    ProBNP (last 3 results) No results for input(s): PROBNP in the last 8760 hours.  CBG: No results for input(s): GLUCAP in the last 168 hours.  Recent Results (from the past 240 hour(s))  Blood Culture (routine x 2)     Status: None (Preliminary result)   Collection Time: 03/28/15 11:40 AM  Result Value Ref Range Status   Specimen Description BLOOD RIGHT HAND  Final   Special Requests BOTTLES DRAWN AEROBIC AND ANAEROBIC 5CC  Final   Culture   Final    NO GROWTH 1 DAY Performed at [facility]    Report Status PENDING  Incomplete  Blood Culture (routine x 2)     Status: None (Preliminary result)   Collection Time: 03/28/15 12:10 PM  Result Value Ref Range Status   Specimen Description BLOOD LEFT ANTECUBITAL  Final   Special Requests BOTTLES DRAWN AEROBIC AND ANAEROBIC 5CC  Final   Culture   Final    NO GROWTH < 24 HOURS Performed at [facility]    Report Status PENDING  Incomplete  Urine culture     Status: None (Preliminary result)   Collection Time: 03/28/15 12:27 PM  Result Value Ref Range Status   Specimen Description URINE, CATHETERIZED  Final   Special Requests NONE  Final   Culture   Final    >=100,000 COLONIES/mL GRAM NEGATIVE RODS CULTURE REINCUBATED FOR BETTER GROWTH Performed at [facility]    Report Status PENDING  Incomplete  MRSA PCR Screening     Status: None   Collection Time: 03/28/15  3:08 PM  Result Value Ref Range Status   MRSA by PCR NEGATIVE NEGATIVE Final    Comment:        The GeneXpert MRSA Assay (FDA approved for NASAL specimens only), is one component of a comprehensive MRSA colonization surveillance program. It is not intended to diagnose  MRSA infection nor to guide or monitor treatment for MRSA infections.      Studies: X-ray Chest Pa And Lateral  03/29/2015   CLINICAL DATA:  Shortness of breath.  EXAM: CHEST  2 VIEW  COMPARISON:  March 28, 2015.  FINDINGS: The heart size and mediastinal contours are within normal limits. No pneumothorax or pleural effusion is noted. Stable scarring or subsegmental atelectasis is noted peripherally in left midlung. Stable bibasilar lung scarring is noted. No acute pulmonary disease is noted. The visualized skeletal structures are unremarkable.  IMPRESSION: Stable bilateral lung scarring is noted. No acute cardiopulmonary abnormality seen.   Electronically Signed   By: Marijo Conception, M.D.   On: 03/29/2015 10:15   Dg Chest Port 1 View  03/28/2015   CLINICAL DATA:  Shortness of breath and fever  EXAM: PORTABLE CHEST 1 VIEW  COMPARISON:  12/14/2014  FINDINGS: Borderline enlargement of the cardiomediastinal silhouette is identified with central vascular prominence. Linear left midlung zone scarring or atelectasis is  reidentified. No new focal pulmonary opacity although there is some crowding of the lung markings at the bases which could obscure airspace disease. No pleural effusion. No acute osseous finding.  IMPRESSION: No new focal acute finding allowing for portable technique. If symptoms continue, consider repeat imaging with PA and lateral chest radiographs obtained at full inspiration for better visualization.   Electronically Signed   By: Conchita Paris M.D.   On: 03/28/2015 12:14    Scheduled Meds: . aspirin EC  81 mg Oral Daily  . azithromycin  500 mg Intravenous Q24H  . cefTRIAXone (ROCEPHIN)  IV  1 g Intravenous Q24H  . efavirenz-emtricitabine-tenofovir  1 tablet Oral QHS  . enoxaparin (LOVENOX) injection  50 mg Subcutaneous Q24H  . gabapentin  300 mg Oral TID  . mometasone-formoterol  2 puff Inhalation BID  . nicotine  21 mg Transdermal Q24H  . predniSONE  60 mg Oral Q breakfast   . sodium chloride  3 mL Intravenous Q12H  . venlafaxine XR  150 mg Oral Daily   Continuous Infusions:    Principal Problem:   Respiratory failure Active Problems:   CAP (community acquired pneumonia)   Human immunodeficiency virus (HIV) disease   Hypertension   Cigarette smoker   Lung cancer   UTI (lower urinary tract infection)    Time spent: 30 min    Kelvin Cellar  [facility] Pager [phone]. If 7PM-7AM, please contact night-coverage at [email or web address], password [facility] 03/30/2015, 11:16 AM  LOS: 2 days

## 2015-03-31 DIAGNOSIS — J9600 Acute respiratory failure, unspecified whether with hypoxia or hypercapnia: Secondary | ICD-10-CM

## 2015-03-31 DIAGNOSIS — Z720 Tobacco use: Secondary | ICD-10-CM

## 2015-03-31 LAB — CBC
HCT: 38.1 % (ref 36.0–46.0)
Hemoglobin: 11.6 g/dL — ABNORMAL LOW (ref 12.0–15.0)
MCH: 28.4 pg (ref 26.0–34.0)
MCHC: 30.4 g/dL (ref 30.0–36.0)
MCV: 93.4 fL (ref 78.0–100.0)
PLATELETS: 206 10*3/uL (ref 150–400)
RBC: 4.08 MIL/uL (ref 3.87–5.11)
RDW: 15.4 % (ref 11.5–15.5)
WBC: 10.1 10*3/uL (ref 4.0–10.5)

## 2015-03-31 LAB — URINE CULTURE: Culture: >=100000

## 2015-03-31 LAB — BASIC METABOLIC PANEL
Anion gap: 6 (ref 5–15)
BUN: 18 mg/dL (ref 6–20)
CHLORIDE: 106 mmol/L (ref 101–111)
CO2: 31 mmol/L (ref 22–32)
CREATININE: 0.92 mg/dL (ref 0.44–1.00)
Calcium: 8.6 mg/dL — ABNORMAL LOW (ref 8.9–10.3)
GFR calc non Af Amer: >60 mL/min (ref >60)
GLUCOSE: 93 mg/dL (ref 65–99)
Potassium: 4 mmol/L (ref 3.5–5.1)
Sodium: 143 mmol/L (ref 135–145)

## 2015-03-31 MED ORDER — LEVOFLOXACIN 750 MG PO TABS: 750.0000 mg | ORAL_TABLET | Freq: Every day | ORAL | Status: DC

## 2015-03-31 MED ORDER — PREDNISONE 10 MG (21) PO TBPK: ORAL_TABLET | ORAL | Status: DC

## 2015-03-31 NOTE — Discharge Summary (Signed)
Physician Discharge Summary  [name] LSL:[id number] DOB: 18-Aug-1945 DOA: 03/28/2015  PCP: [name], MD  Admit date: 03/28/2015 Discharge date: 03/31/2015  Time spent: 35 minutes  Recommendations for Outpatient Follow-up:  1. Please follow up on respiratory status, she presented with acute respiratory failure secondary to CAP and COPD exacerbation 2. Follow up on HIV Viral Load, lab ordered during this hospitalization. CD4 count was 280   Discharge Diagnoses:  Principal Problem:   Respiratory failure ([place]) Active Problems:   CAP (community acquired pneumonia)   Human immunodeficiency virus (HIV) disease ([place])   Hypertension   Cigarette smoker   Lung cancer ([place])   UTI (lower urinary tract infection)   Discharge Condition: Stable/Improved  Diet recommendation: Regular Diet  Filed Weights   03/28/15 1143 03/28/15 1447 03/29/15 1338  Weight: 100.699 kg (222 lb) 102.1 kg (225 lb 1.4 oz) 102 kg (224 lb 13.9 oz)    History of present illness:  [name] is a 69 y.o. female with a past medical history of HIV, asthma/COPD, history of non-small cell lung cancer, presenting to the emergency department with complaints of shortness of breath. She reports symptoms started last Monday that began with generalized weakness, malaise, fatigue, poor tolerance to physical exertion. On the following day she developed worsening cough associate with white/yellow sputum production and shortness of breath. She also reported chest pain that worsened with deep inspiration and cough. The last 24 hours she started developing fevers, chills, and cold sweats. She has not been treated with antimicrobial therapy recently. This morning symptoms were severe enough to call EMS. She was brought to the emergency department and found to be hypoxemic having O2 sat of 74% on room air. Chest x-ray on admission did not reveal evidence of acute infiltrate. She was given empiric vancomycin and Zosyn in the  emergency room.  Hospital Course:  [name] is a 69 year old female with a history of HIV, asthma/COPD, history non-small cell lung cancer, admitted to the medicine service on 03/28/2015 when she presented with acute hypoxemic respiratory failure. She had reported generalized weakness, malaise, fatigue, cough with associated sputum production and fevers prior to presentation. Although initial chest x-ray did not show an obvious infiltrate she was started on empiric IV antibiotic therapy for community-acquired pneumonia. Chronic active pulmonary disease exacerbation felt to be a contributor of respiratory failure as well since she had presence of wheezing on exam. She was started on systemic steroids and nebulizer treatments. On the following morning she reported feeling significantly better. Her CD4 count was checked, coming back lower at 280 compared to count of 850 on 05/02/2014. I discussed case with Dr Tommy Medal who recommended checking a Viral Load, meanwhile continuing to treat as a community acquired PNA. By 03/31/2015 she reported feeling significantly better and felt well enough to be discharged home.    Consultations:  Telephone consultation made to Dr Tommy Medal of Infectious Disease  Discharge Exam: Filed Vitals:   03/31/15 0532  BP: 140/78  Pulse: 78  Temp: 98.5 F (36.9 C)  Resp: 20    General: Patient is sitting up, in no acute distress, reporting feeling better, thinks she is ready to go home.   Cardiovascular: Regular rate and rhythm normal S1-S2 no murmurs rubs or gallops  Respiratory: Normal respiratory effort, having a few expiratory wheezes. Overall stable exam  Abdomen: Soft mild generalized tenderness to palpation  Musculoskeletal: No edema  Discharge Instructions   Discharge Instructions    Call MD for:  difficulty  breathing, headache or visual disturbances    Complete by:  As directed      Call MD for:  extreme fatigue    Complete by:  As directed       Call MD for:  hives    Complete by:  As directed      Call MD for:  persistant dizziness or light-headedness    Complete by:  As directed      Call MD for:  persistant nausea and vomiting    Complete by:  As directed      Call MD for:  redness, tenderness, or signs of infection (pain, swelling, redness, odor or green/yellow discharge around incision site)    Complete by:  As directed      Call MD for:  severe uncontrolled pain    Complete by:  As directed      Call MD for:  temperature >100.4    Complete by:  As directed      Call MD for:    Complete by:  As directed      Diet - low sodium heart healthy    Complete by:  As directed      Increase activity slowly    Complete by:  As directed           Current Discharge Medication List    START taking these medications   Details  levofloxacin (LEVAQUIN) 750 MG tablet Take 1 tablet (750 mg total) by mouth daily. Qty: 4 tablet, Refills: 0    predniSONE (STERAPRED UNI-PAK 21 TAB) 10 MG (21) TBPK tablet Take 6-5-4-3-2-1 tablets by mouth daily till gone. Qty: 21 tablet, Refills: 0      CONTINUE these medications which have NOT CHANGED   Details  albuterol (PROVENTIL HFA;VENTOLIN HFA) 108 (90 BASE) MCG/ACT inhaler Inhale 1 puff into the lungs 2 (two) times daily.     albuterol (PROVENTIL) (2.5 MG/3ML) 0.083% nebulizer solution Take 2.5 mg by nebulization every 4 (four) hours as needed for wheezing or shortness of breath.     Ascorbic Acid (VITAMIN C) 100 MG tablet Take 100 mg by mouth daily.    aspirin EC 81 MG tablet Take 81 mg by mouth daily.    B Complex-Biotin-FA (VITAMIN B50 COMPLEX PO) Take 1 tablet by mouth daily.    Calcium Carbonate-Vitamin D (CALCIUM 600+D) 600-400 MG-UNIT per tablet Take 1 tablet by mouth daily.    efavirenz-emtricitabine-tenofovir (ATRIPLA) 600-200-300 MG per tablet Take 1 tablet by mouth at bedtime. Qty: 30 tablet, Refills: 6    Fluticasone-Salmeterol (ADVAIR) 500-50 MCG/DOSE AEPB Inhale 1 puff  into the lungs every 12 (twelve) hours.    gabapentin (NEURONTIN) 300 MG capsule TAKE 2 CAPSULES BY MOUTH THREE TIMES A DAY Qty: 180 capsule, Refills: 0   Associated Diagnoses: Neuropathy (HCC)    loratadine (CLARITIN) 10 MG tablet Take 10 mg by mouth daily.    methocarbamol (ROBAXIN) 500 MG tablet Take 1 tablet (500 mg total) by mouth every 6 (six) hours as needed for muscle spasms. Qty: 30 tablet, Refills: 0    montelukast (SINGULAIR) 10 MG tablet Take 1 tablet (10 mg total) by mouth at bedtime. Qty: 30 tablet, Refills: 6   Associated Diagnoses: Seasonal allergies    oxyCODONE-acetaminophen (PERCOCET/ROXICET) 5-325 MG per tablet Take 1-2 tablets by mouth every 6 (six) hours as needed for severe pain (pain). Qty: 20 tablet, Refills: 0    SPIRIVA RESPIMAT 2.5 MCG/ACT AERS INHALE 2 PUFFS INTO THE LUNGS DAILY Qty: 1  Inhaler, Refills: 0    venlafaxine XR (EFFEXOR-XR) 150 MG 24 hr capsule Take 150 mg by mouth daily with breakfast.     ibuprofen (ADVIL,MOTRIN) 600 MG tablet Take 1 tablet (600 mg total) by mouth every 8 (eight) hours as needed. Qty: 15 tablet, Refills: 0    nicotine (NICODERM CQ - DOSED IN MG/24 HOURS) 21 mg/24hr patch Place 1 patch (21 mg total) onto the skin daily. Qty: 28 patch, Refills: 0      STOP taking these medications     naproxen (NAPROSYN) 500 MG tablet      VITAMIN A PO      predniSONE (DELTASONE) 20 MG tablet        No Known Allergies    The results of significant diagnostics from this hospitalization (including imaging, microbiology, ancillary and laboratory) are listed below for reference.    Significant Diagnostic Studies: X-ray Chest Pa And Lateral  03/29/2015   CLINICAL DATA:  Shortness of breath.  EXAM: CHEST  2 VIEW  COMPARISON:  March 28, 2015.  FINDINGS: The heart size and mediastinal contours are within normal limits. No pneumothorax or pleural effusion is noted. Stable scarring or subsegmental atelectasis is noted peripherally in  left midlung. Stable bibasilar lung scarring is noted. No acute pulmonary disease is noted. The visualized skeletal structures are unremarkable.  IMPRESSION: Stable bilateral lung scarring is noted. No acute cardiopulmonary abnormality seen.   Electronically Signed   By: Marijo Conception, M.D.   On: 03/29/2015 10:15   Dg Chest Port 1 View  03/28/2015   CLINICAL DATA:  Shortness of breath and fever  EXAM: PORTABLE CHEST 1 VIEW  COMPARISON:  12/14/2014  FINDINGS: Borderline enlargement of the cardiomediastinal silhouette is identified with central vascular prominence. Linear left midlung zone scarring or atelectasis is reidentified. No new focal pulmonary opacity although there is some crowding of the lung markings at the bases which could obscure airspace disease. No pleural effusion. No acute osseous finding.  IMPRESSION: No new focal acute finding allowing for portable technique. If symptoms continue, consider repeat imaging with PA and lateral chest radiographs obtained at full inspiration for better visualization.   Electronically Signed   By: Conchita Paris M.D.   On: 03/28/2015 12:14    Microbiology: Recent Results (from the past 240 hour(s))  Blood Culture (routine x 2)     Status: None (Preliminary result)   Collection Time: 03/28/15 11:40 AM  Result Value Ref Range Status   Specimen Description BLOOD RIGHT HAND  Final   Special Requests BOTTLES DRAWN AEROBIC AND ANAEROBIC 5CC  Final   Culture   Final    NO GROWTH 2 DAYS Performed at [facility]    Report Status PENDING  Incomplete  Blood Culture (routine x 2)     Status: None (Preliminary result)   Collection Time: 03/28/15 12:10 PM  Result Value Ref Range Status   Specimen Description BLOOD LEFT ANTECUBITAL  Final   Special Requests BOTTLES DRAWN AEROBIC AND ANAEROBIC 5CC  Final   Culture   Final    NO GROWTH 2 DAYS Performed at [facility]    Report Status PENDING  Incomplete  Urine culture     Status: None  (Preliminary result)   Collection Time: 03/28/15 12:27 PM  Result Value Ref Range Status   Specimen Description URINE, CATHETERIZED  Final   Special Requests NONE  Final   Culture   Final    >=100,000 COLONIES/mL ESCHERICHIA COLI Performed at  [facility]    Report Status PENDING  Incomplete  MRSA PCR Screening     Status: None   Collection Time: 03/28/15  3:08 PM  Result Value Ref Range Status   MRSA by PCR NEGATIVE NEGATIVE Final    Comment:        The GeneXpert MRSA Assay (FDA approved for NASAL specimens only), is one component of a comprehensive MRSA colonization surveillance program. It is not intended to diagnose MRSA infection nor to guide or monitor treatment for MRSA infections.      Labs: Basic Metabolic Panel:  Recent Labs Lab 03/28/15 1128 03/29/15 0422 03/30/15 0546 03/31/15 0514  NA 138 143 145 143  K 4.2 4.6 4.2 4.0  CL 102 112* 109 106  CO2 '27 25 28 31  '$ GLUCOSE 130* 182* 97 93  BUN '14 13 18 18  '$ CREATININE 1.21* 1.08* 0.94 0.92  CALCIUM 8.8* 8.0* 9.0 8.6*   Liver Function Tests:  Recent Labs Lab 03/28/15 1128  AST 28  ALT 20  ALKPHOS 63  BILITOT 0.5  PROT 8.6*  ALBUMIN 4.4   No results for input(s): LIPASE, AMYLASE in the last 168 hours. No results for input(s): AMMONIA in the last 168 hours. CBC:  Recent Labs Lab 03/28/15 1128 03/29/15 0422 03/30/15 0546 03/31/15 0514  WBC 10.8* 7.9 11.9* 10.1  NEUTROABS 8.4*  --   --   --   HGB 13.5 11.6* 12.2 11.6*  HCT 43.4 36.9 39.8 38.1  MCV 93.7 93.9 94.3 93.4  PLT 178 155 211 206   Cardiac Enzymes: No results for input(s): CKTOTAL, CKMB, CKMBINDEX, TROPONINI in the last 168 hours. BNP: BNP (last 3 results)  Recent Labs  09/15/14 0330  BNP 19.0    ProBNP (last 3 results) No results for input(s): PROBNP in the last 8760 hours.  CBG: No results for input(s): GLUCAP in the last 168 hours.     SignedKelvin Cellar  [facility] 03/31/2015, 10:32  AM

## 2015-03-31 NOTE — Progress Notes (Signed)
D/c'd via w/c to home.voices no c/o

## 2015-04-02 LAB — GI PATHOGEN PANEL BY PCR, STOOL
C DIFFICILE TOXIN A/B: NOT DETECTED
CRYPTOSPORIDIUM BY PCR: NOT DETECTED
Campylobacter by PCR: NOT DETECTED
E COLI (ETEC) LT/ST: NOT DETECTED
E COLI (STEC): NOT DETECTED
E COLI 0157 BY PCR: NOT DETECTED
G LAMBLIA BY PCR: NOT DETECTED
Norovirus GI/GII: NOT DETECTED
Rotavirus A by PCR: NOT DETECTED
Salmonella by PCR: NOT DETECTED
Shigella by PCR: NOT DETECTED

## 2015-04-02 LAB — CULTURE, BLOOD (ROUTINE X 2)
CULTURE: NO GROWTH
Culture: NO GROWTH

## 2015-04-02 LAB — HIV-1 RNA ULTRAQUANT REFLEX TO GENTYP+
HIV-1 RNA BY PCR: 170 {copies}/mL
HIV-1 RNA Quant, Log: 2.23 log10copy/mL

## 2015-04-04 DIAGNOSIS — M5136 Other intervertebral disc degeneration, lumbar region: Secondary | ICD-10-CM | POA: Diagnosis not present

## 2015-04-04 DIAGNOSIS — R7301 Impaired fasting glucose: Secondary | ICD-10-CM | POA: Diagnosis not present

## 2015-04-04 DIAGNOSIS — A419 Sepsis, unspecified organism: Secondary | ICD-10-CM | POA: Diagnosis not present

## 2015-04-04 DIAGNOSIS — J159 Unspecified bacterial pneumonia: Secondary | ICD-10-CM | POA: Diagnosis not present

## 2015-04-04 DIAGNOSIS — J449 Chronic obstructive pulmonary disease, unspecified: Secondary | ICD-10-CM | POA: Diagnosis not present

## 2015-04-09 ENCOUNTER — Ambulatory Visit: Payer: Medicare Other | Admitting: Infectious Disease

## 2015-04-10 ENCOUNTER — Encounter: Payer: Medicare Other | Admitting: Gastroenterology

## 2015-04-19 ENCOUNTER — Encounter: Payer: Self-pay | Admitting: Internal Medicine

## 2015-05-01 DIAGNOSIS — R7301 Impaired fasting glucose: Secondary | ICD-10-CM | POA: Diagnosis not present

## 2015-05-01 DIAGNOSIS — F17210 Nicotine dependence, cigarettes, uncomplicated: Secondary | ICD-10-CM | POA: Diagnosis not present

## 2015-05-01 DIAGNOSIS — J449 Chronic obstructive pulmonary disease, unspecified: Secondary | ICD-10-CM | POA: Diagnosis not present

## 2015-05-01 DIAGNOSIS — J302 Other seasonal allergic rhinitis: Secondary | ICD-10-CM | POA: Diagnosis not present

## 2015-05-01 DIAGNOSIS — Z21 Asymptomatic human immunodeficiency virus [HIV] infection status: Secondary | ICD-10-CM | POA: Diagnosis not present

## 2015-05-08 ENCOUNTER — Other Ambulatory Visit: Payer: Self-pay

## 2015-05-09 ENCOUNTER — Other Ambulatory Visit: Payer: Self-pay | Admitting: Internal Medicine

## 2015-05-09 DIAGNOSIS — E2839 Other primary ovarian failure: Secondary | ICD-10-CM

## 2015-08-02 ENCOUNTER — Other Ambulatory Visit: Payer: Self-pay | Admitting: Infectious Disease

## 2015-09-02 ENCOUNTER — Other Ambulatory Visit: Payer: Self-pay | Admitting: Infectious Disease

## 2015-09-02 DIAGNOSIS — B20 Human immunodeficiency virus [HIV] disease: Secondary | ICD-10-CM

## 2015-09-08 DIAGNOSIS — I700 Atherosclerosis of aorta: Secondary | ICD-10-CM | POA: Insufficient documentation

## 2015-09-08 DIAGNOSIS — J441 Chronic obstructive pulmonary disease with (acute) exacerbation: Secondary | ICD-10-CM | POA: Insufficient documentation

## 2015-09-08 DIAGNOSIS — Z85118 Personal history of other malignant neoplasm of bronchus and lung: Secondary | ICD-10-CM | POA: Insufficient documentation

## 2015-09-09 DIAGNOSIS — J40 Bronchitis, not specified as acute or chronic: Secondary | ICD-10-CM | POA: Insufficient documentation

## 2015-10-12 DIAGNOSIS — R911 Solitary pulmonary nodule: Secondary | ICD-10-CM | POA: Insufficient documentation

## 2015-10-31 ENCOUNTER — Other Ambulatory Visit: Payer: Self-pay | Admitting: Infectious Disease

## 2016-06-13 IMAGING — CT CT BIOPSY
2 of 4 series · 13 of 32 positions shown, 18 images · non-contrast
Comparison: none

CLINICAL DATA: 68-year-old female with a hypermetabolic left upper
lobe pulmonary nodule concerning for primary bronchogenic carcinoma.
Of note, the nodular opacity can be seen on x-ray imaging dating
back to 0813 suggesting that this is an indolent, slow growing
process.
TECHNIQUE: Informed consent was obtained from the patient following explanation
of the procedure, risks, benefits and alternatives. The patient
understands, agrees and consents for the procedure. All questions
were addressed. A time out was performed.

[Series 3: i-spiral 5.0 b40f · axial · 0.65mm/px · z∈[+1362,+1456]mm · 7 of 37 slices shown, 12 images (1 of 2)]
[im 5/37  soft-tissue]
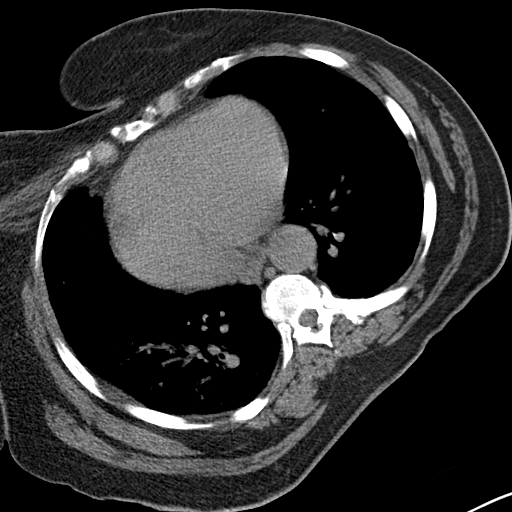
[im 5/37  bone]
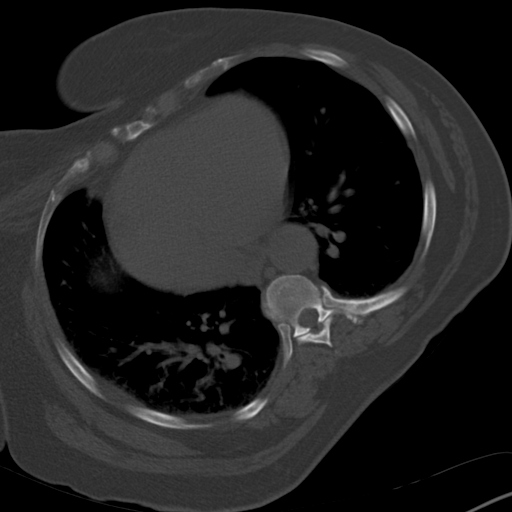
[im 10/37  soft-tissue]
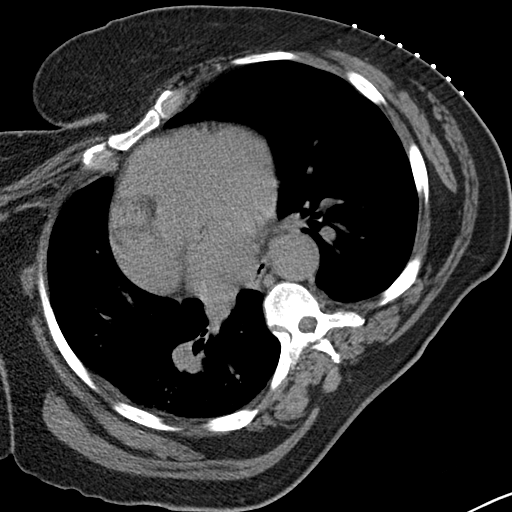
[im 14/37  soft-tissue]
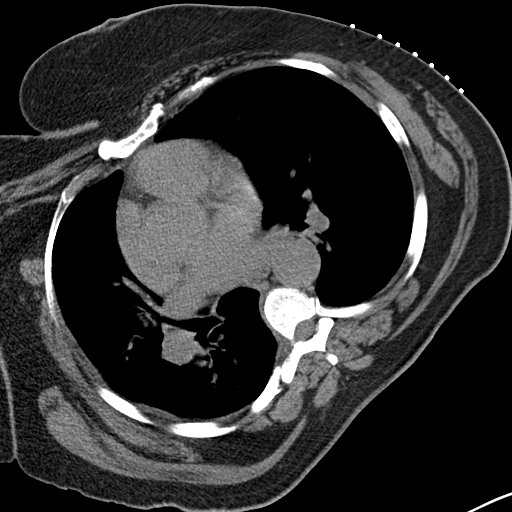
[im 19/37  soft-tissue]
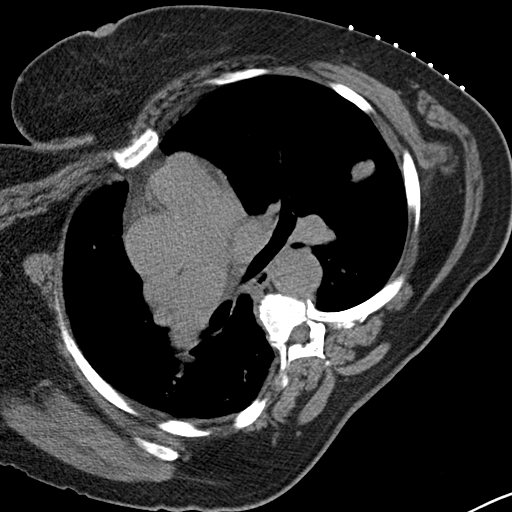
[im 19/37  lung]
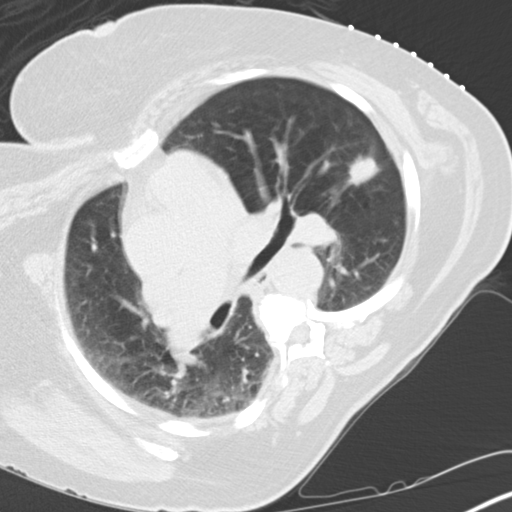
[im 23/37  soft-tissue]
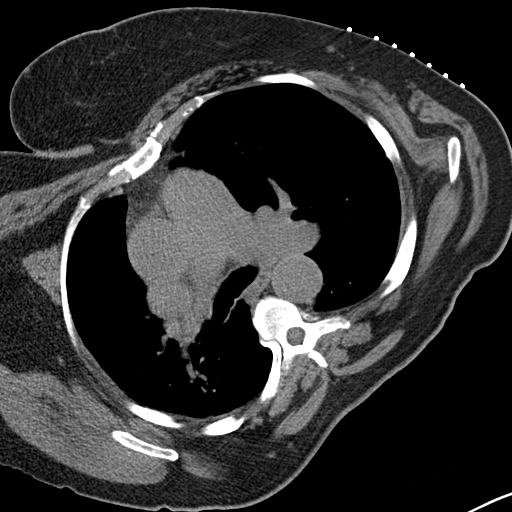
[im 23/37  lung]
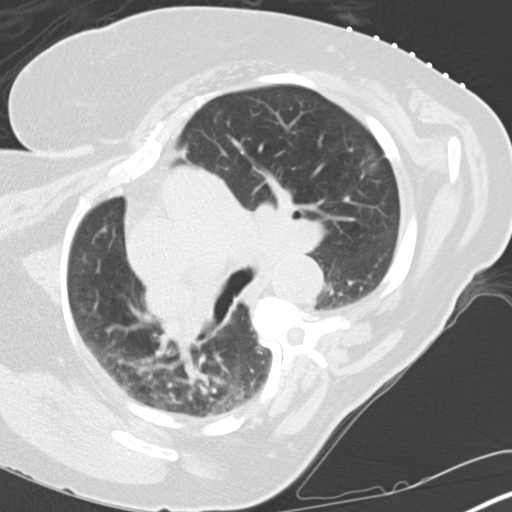
[im 28/37  soft-tissue]
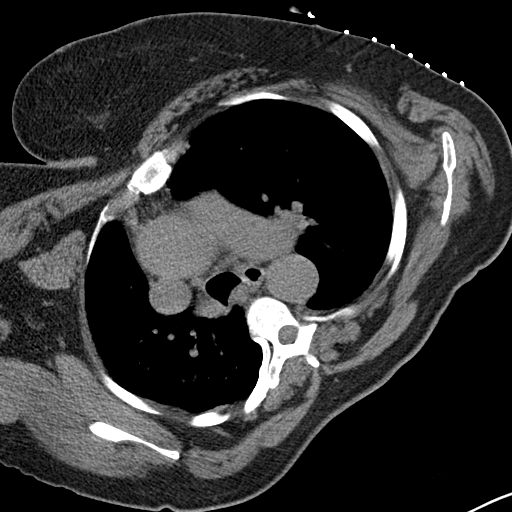
[im 28/37  lung]
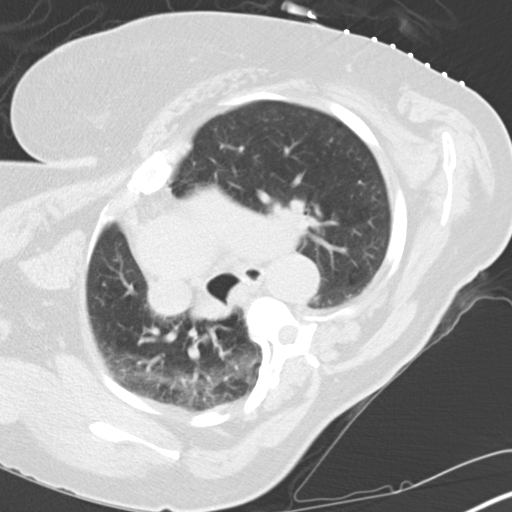
[im 32/37  soft-tissue]
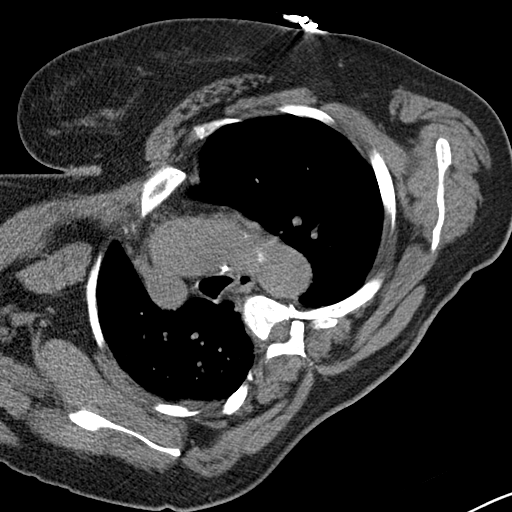
[im 32/37  lung]
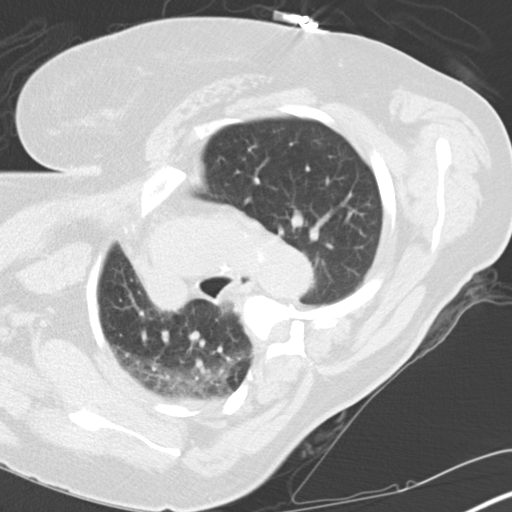

[Series 5: i-spiral 5.0 b40f · axial · 0.65mm/px · z∈[+1362,+1442]mm · 6 of 37 slices shown (2 of 2)]
[im 5/37  soft-tissue]
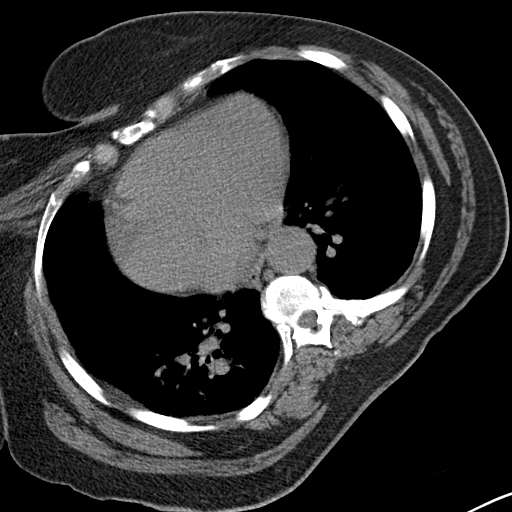
[im 10/37  soft-tissue]
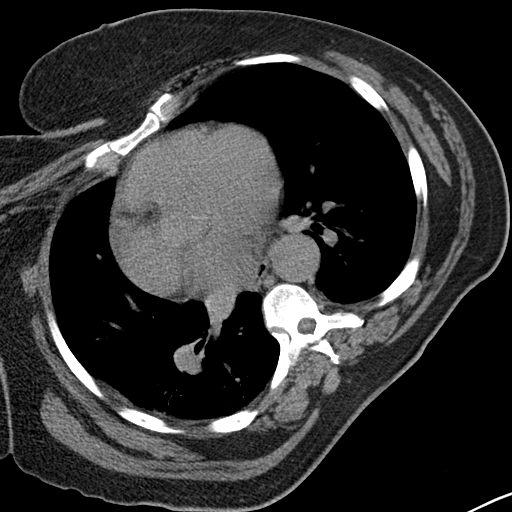
[im 14/37  soft-tissue]
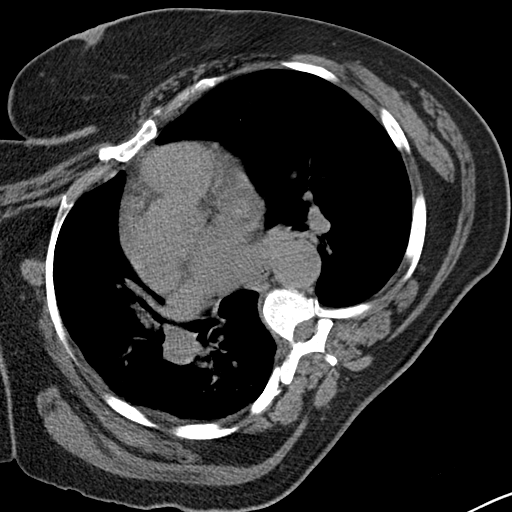
[im 19/37  soft-tissue]
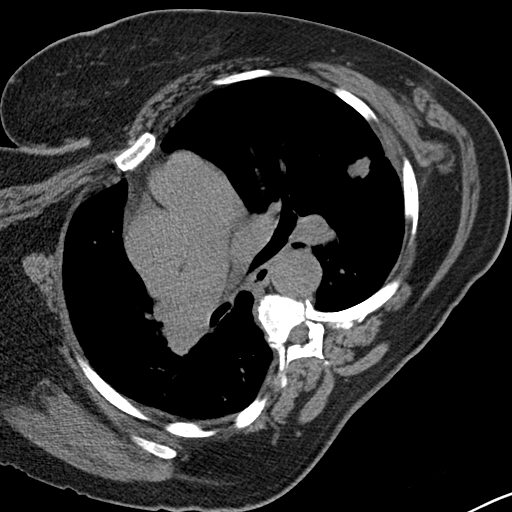
[im 23/37  soft-tissue]
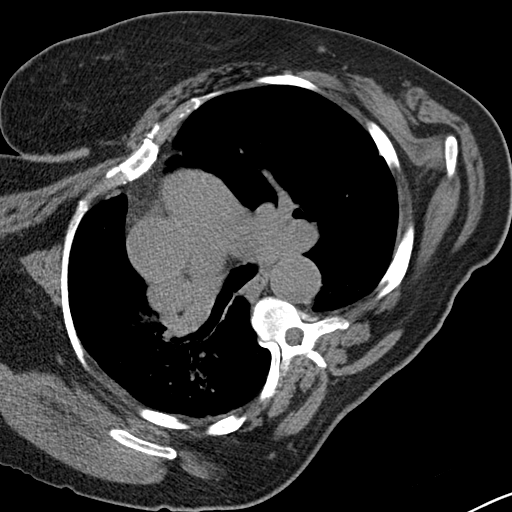
[im 28/37  soft-tissue]
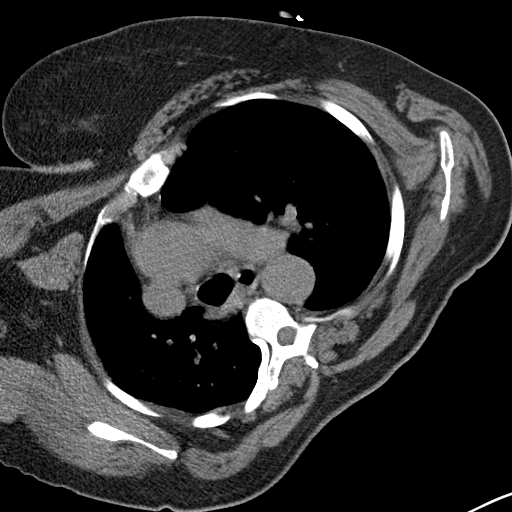

[13 of 32 positions shown; findings below may reference images not displayed]

EXAM:
CT BIOPSY

Date: 12/14/2013

PROCEDURE:
1. CT-guided biopsy of left upper lobe pulmonary nodule

ANESTHESIA/SEDATION:
Moderate (conscious) sedation was used. Two mg Versed, 50 mcg
Fentanyl were administered intravenously. The patient's vital signs
were monitored continuously by radiology nursing throughout the
procedure.

Sedation Time: 23 minutes
The patient was positioned in the LAO position on the table. A
planning axial CT scan was performed. The left upper lobe spiculated
pulmonary nodule was identified. A suitable skin entry site was
selected and marked. The region was then sterilely prepped and
draped in the standard fashion using Betadine skin prep. Local
anesthesia was attained by infiltration with 1% lidocaine. A small
dermatotomy was made. Under intermittent CT fluoroscopic imaging, a
17 gauge trocar needle was advanced into the margin of the nodule.
Several 18 gauge core biopsies were then coaxially obtained using
the Kalob Te automated biopsy device. Biopsy specimens were placed
in formalin and delivered to pathology for further analysis.

The needles were removed. A follow-up axial CT scan demonstrates no
evidence of pneumothorax, alveolar hemorrhage or other complicating
feature. The patient tolerated the procedure very well.

COMPLICATIONS:
None immediate.
IMPRESSION: Technically successful CT-guided core biopsy of left upper lobe
pulmonary nodule.

## 2016-08-12 DIAGNOSIS — N183 Chronic kidney disease, stage 3 unspecified: Secondary | ICD-10-CM | POA: Insufficient documentation

## 2018-03-24 ENCOUNTER — Telehealth: Payer: Self-pay

## 2018-03-24 NOTE — Telephone Encounter (Signed)
Patient is on overdue pap list and needs to schedule an appointment with our office for HIV care. Patient has not been in our office since 2017. Last pap smear was done on 08/26/12 according to our records. If patient calls back she can schedule a appointment with Janene Madeira, NP for pap smear if she does not have a OBGYN. [place]

## 2019-01-18 ENCOUNTER — Ambulatory Visit (HOSPITAL_COMMUNITY)
Admission: EM | Admit: 2019-01-18 | Discharge: 2019-01-18 | Disposition: A | Discharge status: Home / Self Care | Payer: Medicare Other | Attending: Urgent Care | Admitting: Urgent Care

## 2019-01-18 ENCOUNTER — Other Ambulatory Visit: Payer: Self-pay

## 2019-01-18 ENCOUNTER — Encounter (HOSPITAL_COMMUNITY): Payer: Self-pay | Admitting: Urgent Care

## 2019-01-18 DIAGNOSIS — J3089 Other allergic rhinitis: Secondary | ICD-10-CM | POA: Diagnosis present

## 2019-01-18 DIAGNOSIS — J449 Chronic obstructive pulmonary disease, unspecified: Secondary | ICD-10-CM | POA: Insufficient documentation

## 2019-01-18 DIAGNOSIS — Z20828 Contact with and (suspected) exposure to other viral communicable diseases: Secondary | ICD-10-CM | POA: Diagnosis not present

## 2019-01-18 DIAGNOSIS — Z8709 Personal history of other diseases of the respiratory system: Secondary | ICD-10-CM | POA: Diagnosis present

## 2019-01-18 DIAGNOSIS — R0602 Shortness of breath: Secondary | ICD-10-CM | POA: Diagnosis not present

## 2019-01-18 DIAGNOSIS — R0789 Other chest pain: Secondary | ICD-10-CM | POA: Insufficient documentation

## 2019-01-18 MED ORDER — PREDNISONE 20 MG PO TABS: ORAL_TABLET | ORAL | 0 refills | Status: DC

## 2019-01-18 MED ORDER — ALBUTEROL SULFATE (2.5 MG/3ML) 0.083% IN NEBU: 2.5000 mg | INHALATION_SOLUTION | RESPIRATORY_TRACT | 0 refills | Status: DC | PRN

## 2019-01-18 NOTE — ED Provider Notes (Signed)
MRN: [id number] DOB: 27-Jan-1946  Subjective:   [name] is a 73 y.o. female presenting for 3 day history of progressively worsening mild to moderate intermittent shob, chest tightness/heaviness. Has a hx of COPD, uses 2L oxygen at home as needed.  States that she monitors her pulse oximetry at home and feels that it is doing pretty good today at 94%.  She only uses her oxygen when her pulse ox goes to the lower 90s.  She has a history of respiratory distress and failure but states that she is a far away from this, "knows when to be in the hospital".  Takes Spiriva, Advair, albuterol nebulized. Has needed to use her nebulizer ~1x per day in the past 3 days. Patient moved 12/31/2018 from [place], [place] to [place].  Has had to take her dog outdoors a lot. Feels like moving and having to walk her dog is the source of her symptoms. Patient has HIV, takes Biktarvy for this. Has not had any COVID 19 contacts. Has isolated quite well even when living in [place]. She has not gotten tested for COVID 19 yet however.   No current facility-administered medications for this encounter.   Current Outpatient Medications:  .  albuterol (PROVENTIL HFA;VENTOLIN HFA) 108 (90 BASE) MCG/ACT inhaler, Inhale 1 puff into the lungs 2 (two) times daily. , Disp: , Rfl:  .  albuterol (PROVENTIL) (2.5 MG/3ML) 0.083% nebulizer solution, Take 2.5 mg by nebulization every 4 (four) hours as needed for wheezing or shortness of breath. , Disp: , Rfl:  .  Ascorbic Acid (VITAMIN C) 100 MG tablet, Take 100 mg by mouth daily., Disp: , Rfl:  .  aspirin EC 81 MG tablet, Take 81 mg by mouth daily., Disp: , Rfl:  .  ATRIPLA 600-200-300 MG tablet, TAKE 1 TABLET BY MOUTH EVERY NIGHT AT BEDTIME, Disp: 30 tablet, Rfl: 1 .  B Complex-Biotin-FA (VITAMIN B50 COMPLEX PO), Take 1 tablet by mouth daily., Disp: , Rfl:  .  Calcium Carbonate-Vitamin D (CALCIUM 600+D) 600-400 MG-UNIT per tablet, Take 1 tablet by mouth daily., Disp: , Rfl:  .   Fluticasone-Salmeterol (ADVAIR) 500-50 MCG/DOSE AEPB, Inhale 1 puff into the lungs every 12 (twelve) hours., Disp: , Rfl:  .  gabapentin (NEURONTIN) 300 MG capsule, TAKE 2 CAPSULES BY MOUTH THREE TIMES A DAY, Disp: 180 capsule, Rfl: 0 .  ibuprofen (ADVIL,MOTRIN) 600 MG tablet, Take 1 tablet (600 mg total) by mouth every 8 (eight) hours as needed. (Patient not taking: Reported on 03/28/2015), Disp: 15 tablet, Rfl: 0 .  levofloxacin (LEVAQUIN) 750 MG tablet, Take 1 tablet (750 mg total) by mouth daily., Disp: 4 tablet, Rfl: 0 .  loratadine (CLARITIN) 10 MG tablet, Take 10 mg by mouth daily., Disp: , Rfl:  .  methocarbamol (ROBAXIN) 500 MG tablet, Take 1 tablet (500 mg total) by mouth every 6 (six) hours as needed for muscle spasms., Disp: 30 tablet, Rfl: 0 .  montelukast (SINGULAIR) 10 MG tablet, Take 1 tablet (10 mg total) by mouth at bedtime., Disp: 30 tablet, Rfl: 6 .  nicotine (NICODERM CQ - DOSED IN MG/24 HOURS) 21 mg/24hr patch, Place 1 patch (21 mg total) onto the skin daily. (Patient not taking: Reported on 09/15/2014), Disp: 28 patch, Rfl: 0 .  oxyCODONE-acetaminophen (PERCOCET/ROXICET) 5-325 MG per tablet, Take 1-2 tablets by mouth every 6 (six) hours as needed for severe pain (pain)., Disp: 20 tablet, Rfl: 0 .  predniSONE (STERAPRED UNI-PAK 21 TAB) 10 MG (21) TBPK tablet, Take 6-5-4-3-2-1  tablets by mouth daily till gone., Disp: 21 tablet, Rfl: 0 .  SPIRIVA RESPIMAT 2.5 MCG/ACT AERS, INHALE 2 PUFFS INTO THE LUNGS DAILY, Disp: 1 Inhaler, Rfl: 0 .  venlafaxine XR (EFFEXOR-XR) 150 MG 24 hr capsule, Take 150 mg by mouth daily with breakfast. , Disp: , Rfl:    No Known Allergies  Past Medical History:  Diagnosis Date  . Asthma   . Atypical squamous cells of undetermined significance (ASCUS) on Papanicolaou smear of cervix   . COPD (chronic obstructive pulmonary disease) ([place])   . H/O drainage of abscess    left axilla, from left breast  . Hep B w/o coma   . HIV (human immunodeficiency virus  infection) ([place])   . Hyperlipidemia   . 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 Neuromuscular disorder ([place])    fingers/feet neuropathy  . Non-small cell lung cancer ([place])   . S/P radiation therapy 01/26/14-02/02/14   sbrt  lt upper lung-54Gy/70fx     Past Surgical History:  Procedure Laterality Date  . ABCESS DRAINAGE     abcess under Left arm, abcess from L breast  . removal of abnormal cells on uterus    . thyroid gland removed      ROS Denies fever, sore throat, runny stuffy nose, ear pain, chest pain, nausea, vomiting, abdominal pain, rashes.  Objective:   Vitals: BP (!) 106/42 (BP Location: Right Arm)   Pulse 70   Temp 98.9 F (37.2 C) (Oral)   Resp (!) 22   SpO2 94%   Physical Exam Constitutional:      General: She is not in acute distress.    Appearance: Normal appearance. She is well-developed. She is ill-appearing (Chronically ill). She is not toxic-appearing or diaphoretic.  HENT:     Head: Normocephalic and atraumatic.     Comments: Does not have pursed or cyanotic lips.    Nose: Nose normal.     Mouth/Throat:     Mouth: Mucous membranes are moist.     Pharynx: No oropharyngeal exudate or posterior oropharyngeal erythema.  Eyes:     General:        Right eye: No discharge.     Extraocular Movements: Extraocular movements intact.     Pupils: Pupils are equal, round, and reactive to light.  Neck:     Musculoskeletal: Normal range of motion and neck supple. No neck rigidity.  Cardiovascular:     Rate and Rhythm: Normal rate and regular rhythm.     Pulses: Normal pulses.     Heart sounds: Normal heart sounds. No murmur. No friction rub. No gallop.   Pulmonary:     Effort: Pulmonary effort is normal. No respiratory distress.     Breath sounds: No stridor. No wheezing, rhonchi or rales.     Comments: Slightly decreased lung sounds throughout. Lymphadenopathy:     Cervical: No cervical adenopathy.  Skin:    General: Skin is warm and dry.      Findings: No rash.  Neurological:     General: No focal deficit present.     Mental Status: She is alert and oriented to person, place, and time.     Cranial Nerves: No cranial nerve deficit.  Psychiatric:        Mood and Affect: Mood normal.        Behavior: Behavior normal.        Thought Content: Thought content normal.     Assessment and Plan :  1. Shortness of breath   2. Chest tightness   3. Chronic obstructive pulmonary disease, unspecified COPD type ([place])   4. Allergic rhinitis due to other allergic trigger, unspecified seasonality   5. History of respiratory failure     Patient insisted that she does not have COVID-19.  I negotiated with patient to use prednisone but not antibiotics.  She agreed to get tested for COVID-19 despite low exposure.  Counseled patient on her risk factors and need to stop prednisone immediately if she worsens, COVID-19 test pending.  Patient is to schedule her nebulized albuterol once every 4 hours, provided her with a refill.  She is to maintain her Spiriva, Advair and Singulair.  Also recommended patient take her Claritin daily now.  She can use pseudoephedrine as needed for nasal congestion and postnasal drainage. Counseled patient on potential for adverse effects with medications prescribed/recommended today, ER and return-to-clinic precautions discussed, patient verbalized understanding.    Jaynee Eagles, [place] 01/18/19 1534

## 2019-01-18 NOTE — ED Triage Notes (Signed)
Patient states she feels like her copd is acting up. History of the same.    Patient complains of chest tightness, heavy breathing, more asthma attacks than usual.  Patient has additional coughing

## 2019-01-18 NOTE — Discharge Instructions (Addendum)
If you worsen, including chest pain, more shortness of breath and wheezing, then stop the prednisone immediately and head to the [facility] Emergency Room.  Schedule your albuterol inhaler once every 4 hours for the next 3-4 days. Make sure you continue to take your COPD medicines as well including Spiriva and Advair. If your oxygen level drops below 93% even though you are using 2 liters of oxygen, then you have to go to the emergency room. Make sure you keep taking loratadine (Claritin). You may pick up over-the-counter pseudoephedrine (Sudafed) and use this for post-nasal drainage, sinus congestion at a dose of 60mg  every 8-12 hours as needed. Make sure you ask the pharmacist for this as you need to show your license to get it.

## 2019-01-20 LAB — NOVEL CORONAVIRUS, NAA (HOSP ORDER, SEND-OUT TO REF LAB; TAT 18-24 HRS): SARS-CoV-2, NAA: NOT DETECTED

## 2019-02-11 ENCOUNTER — Emergency Department (HOSPITAL_COMMUNITY): Payer: Medicare Other

## 2019-02-11 ENCOUNTER — Inpatient Hospital Stay (HOSPITAL_COMMUNITY)
Admission: EM | Admit: 2019-02-11 | Discharge: 2019-02-17 | DRG: 291 | Disposition: A | Discharge status: Home Health | Payer: Medicare Other | Attending: Internal Medicine | Admitting: Internal Medicine

## 2019-02-11 ENCOUNTER — Encounter (HOSPITAL_COMMUNITY): Payer: Self-pay | Admitting: *Deleted

## 2019-02-11 ENCOUNTER — Other Ambulatory Visit: Payer: Self-pay

## 2019-02-11 DIAGNOSIS — J9600 Acute respiratory failure, unspecified whether with hypoxia or hypercapnia: Secondary | ICD-10-CM

## 2019-02-11 DIAGNOSIS — J441 Chronic obstructive pulmonary disease with (acute) exacerbation: Secondary | ICD-10-CM | POA: Diagnosis present

## 2019-02-11 DIAGNOSIS — Z8249 Family history of ischemic heart disease and other diseases of the circulatory system: Secondary | ICD-10-CM | POA: Diagnosis not present

## 2019-02-11 DIAGNOSIS — I5033 Acute on chronic diastolic (congestive) heart failure: Secondary | ICD-10-CM | POA: Diagnosis present

## 2019-02-11 DIAGNOSIS — Y929 Unspecified place or not applicable: Secondary | ICD-10-CM | POA: Diagnosis not present

## 2019-02-11 DIAGNOSIS — Z9989 Dependence on other enabling machines and devices: Secondary | ICD-10-CM | POA: Diagnosis not present

## 2019-02-11 DIAGNOSIS — I071 Rheumatic tricuspid insufficiency: Secondary | ICD-10-CM | POA: Diagnosis present

## 2019-02-11 DIAGNOSIS — F17210 Nicotine dependence, cigarettes, uncomplicated: Secondary | ICD-10-CM | POA: Diagnosis present

## 2019-02-11 DIAGNOSIS — T404X5A Adverse effect of other synthetic narcotics, initial encounter: Secondary | ICD-10-CM | POA: Diagnosis present

## 2019-02-11 DIAGNOSIS — I361 Nonrheumatic tricuspid (valve) insufficiency: Secondary | ICD-10-CM | POA: Diagnosis not present

## 2019-02-11 DIAGNOSIS — Z21 Asymptomatic human immunodeficiency virus [HIV] infection status: Secondary | ICD-10-CM | POA: Diagnosis present

## 2019-02-11 DIAGNOSIS — J9622 Acute and chronic respiratory failure with hypercapnia: Secondary | ICD-10-CM

## 2019-02-11 DIAGNOSIS — G92 Toxic encephalopathy: Secondary | ICD-10-CM | POA: Diagnosis present

## 2019-02-11 DIAGNOSIS — R739 Hyperglycemia, unspecified: Secondary | ICD-10-CM | POA: Diagnosis present

## 2019-02-11 DIAGNOSIS — I110 Hypertensive heart disease with heart failure: Secondary | ICD-10-CM | POA: Diagnosis not present

## 2019-02-11 DIAGNOSIS — Z85118 Personal history of other malignant neoplasm of bronchus and lung: Secondary | ICD-10-CM | POA: Diagnosis not present

## 2019-02-11 DIAGNOSIS — Z20828 Contact with and (suspected) exposure to other viral communicable diseases: Secondary | ICD-10-CM | POA: Diagnosis present

## 2019-02-11 DIAGNOSIS — G9341 Metabolic encephalopathy: Secondary | ICD-10-CM | POA: Diagnosis not present

## 2019-02-11 DIAGNOSIS — Z833 Family history of diabetes mellitus: Secondary | ICD-10-CM | POA: Diagnosis not present

## 2019-02-11 DIAGNOSIS — J9620 Acute and chronic respiratory failure, unspecified whether with hypoxia or hypercapnia: Secondary | ICD-10-CM | POA: Diagnosis present

## 2019-02-11 DIAGNOSIS — E7220 Disorder of urea cycle metabolism, unspecified: Secondary | ICD-10-CM | POA: Diagnosis present

## 2019-02-11 DIAGNOSIS — J9621 Acute and chronic respiratory failure with hypoxia: Secondary | ICD-10-CM | POA: Diagnosis present

## 2019-02-11 DIAGNOSIS — E662 Morbid (severe) obesity with alveolar hypoventilation: Secondary | ICD-10-CM | POA: Diagnosis present

## 2019-02-11 DIAGNOSIS — J9691 Respiratory failure, unspecified with hypoxia: Secondary | ICD-10-CM

## 2019-02-11 DIAGNOSIS — E6609 Other obesity due to excess calories: Secondary | ICD-10-CM | POA: Diagnosis not present

## 2019-02-11 DIAGNOSIS — Z6832 Body mass index (BMI) 32.0-32.9, adult: Secondary | ICD-10-CM | POA: Diagnosis not present

## 2019-02-11 DIAGNOSIS — Z923 Personal history of irradiation: Secondary | ICD-10-CM | POA: Diagnosis not present

## 2019-02-11 LAB — POCT I-STAT 7, (LYTES, BLD GAS, ICA,H+H)
Acid-Base Excess: 4 mmol/L — ABNORMAL HIGH (ref 0.0–2.0)
Bicarbonate: 34.7 mmol/L — ABNORMAL HIGH (ref 20.0–28.0)
Calcium, Ion: 1.22 mmol/L (ref 1.15–1.40)
HCT: 54 % — ABNORMAL HIGH (ref 36.0–46.0)
Hemoglobin: 18.4 g/dL — ABNORMAL HIGH (ref 12.0–15.0)
O2 Saturation: 94 %
Patient temperature: 98.6
Potassium: 3.8 mmol/L (ref 3.5–5.1)
Sodium: 140 mmol/L (ref 135–145)
TCO2: 37 mmol/L — ABNORMAL HIGH (ref 22–32)
pCO2 arterial: 79.3 mmHg (ref 32.0–48.0)
pH, Arterial: 7.249 — ABNORMAL LOW (ref 7.350–7.450)
pO2, Arterial: 86 mmHg (ref 83.0–108.0)

## 2019-02-11 LAB — COMPREHENSIVE METABOLIC PANEL
ALT: 39 U/L (ref 0–44)
AST: 64 U/L — ABNORMAL HIGH (ref 15–41)
Albumin: 3.2 g/dL — ABNORMAL LOW (ref 3.5–5.0)
Alkaline Phosphatase: 68 U/L (ref 38–126)
Anion gap: 10 (ref 5–15)
BUN: 12 mg/dL (ref 8–23)
CO2: 31 mmol/L (ref 22–32)
Calcium: 8.6 mg/dL — ABNORMAL LOW (ref 8.9–10.3)
Chloride: 98 mmol/L (ref 98–111)
Creatinine, Ser: 0.94 mg/dL (ref 0.44–1.00)
GFR calc Af Amer: >60 mL/min (ref >60)
GFR calc non Af Amer: >60 mL/min (ref >60)
Glucose, Bld: 122 mg/dL — ABNORMAL HIGH (ref 70–99)
Potassium: 4 mmol/L (ref 3.5–5.1)
Sodium: 139 mmol/L (ref 135–145)
Total Bilirubin: 1.1 mg/dL (ref 0.3–1.2)
Total Protein: 7.2 g/dL (ref 6.5–8.1)

## 2019-02-11 LAB — POCT I-STAT EG7
Acid-Base Excess: 5 mmol/L — ABNORMAL HIGH (ref 0.0–2.0)
Bicarbonate: 36.5 mmol/L — ABNORMAL HIGH (ref 20.0–28.0)
Calcium, Ion: 1.15 mmol/L (ref 1.15–1.40)
HCT: 55 % — ABNORMAL HIGH (ref 36.0–46.0)
Hemoglobin: 18.7 g/dL — ABNORMAL HIGH (ref 12.0–15.0)
O2 Saturation: 85 %
Potassium: 3.7 mmol/L (ref 3.5–5.1)
Sodium: 142 mmol/L (ref 135–145)
TCO2: 39 mmol/L — ABNORMAL HIGH (ref 22–32)
pCO2, Ven: 79.6 mmHg (ref 44.0–60.0)
pH, Ven: 7.269 (ref 7.250–7.430)
pO2, Ven: 60 mmHg — ABNORMAL HIGH (ref 32.0–45.0)

## 2019-02-11 LAB — CBC WITH DIFFERENTIAL/PLATELET
Abs Immature Granulocytes: 0.09 10*3/uL — ABNORMAL HIGH (ref 0.00–0.07)
Basophils Absolute: 0 10*3/uL (ref 0.0–0.1)
Basophils Relative: 0 %
Eosinophils Absolute: 0.1 10*3/uL (ref 0.0–0.5)
Eosinophils Relative: 1 %
HCT: 54 % — ABNORMAL HIGH (ref 36.0–46.0)
Hemoglobin: 14.4 g/dL (ref 12.0–15.0)
Immature Granulocytes: 1 %
Lymphocytes Relative: 19 %
Lymphs Abs: 1.7 10*3/uL (ref 0.7–4.0)
MCH: 22.7 pg — ABNORMAL LOW (ref 26.0–34.0)
MCHC: 26.7 g/dL — ABNORMAL LOW (ref 30.0–36.0)
MCV: 85.2 fL (ref 80.0–100.0)
Monocytes Absolute: 0.6 10*3/uL (ref 0.1–1.0)
Monocytes Relative: 7 %
Neutro Abs: 6.3 10*3/uL (ref 1.7–7.7)
Neutrophils Relative %: 72 %
Platelets: 181 10*3/uL (ref 150–400)
RBC: 6.34 MIL/uL — ABNORMAL HIGH (ref 3.87–5.11)
RDW: 21.5 % — ABNORMAL HIGH (ref 11.5–15.5)
WBC: 8.7 10*3/uL (ref 4.0–10.5)
nRBC: 2.6 % — ABNORMAL HIGH (ref 0.0–0.2)

## 2019-02-11 LAB — I-STAT CHEM 8, ED
BUN: 14 mg/dL (ref 8–23)
Calcium, Ion: 1.17 mmol/L (ref 1.15–1.40)
Chloride: 98 mmol/L (ref 98–111)
Creatinine, Ser: 0.9 mg/dL (ref 0.44–1.00)
Glucose, Bld: 113 mg/dL — ABNORMAL HIGH (ref 70–99)
HCT: 52 % — ABNORMAL HIGH (ref 36.0–46.0)
Hemoglobin: 17.7 g/dL — ABNORMAL HIGH (ref 12.0–15.0)
Potassium: 3.8 mmol/L (ref 3.5–5.1)
Sodium: 142 mmol/L (ref 135–145)
TCO2: 35 mmol/L — ABNORMAL HIGH (ref 22–32)

## 2019-02-11 LAB — BRAIN NATRIURETIC PEPTIDE: B Natriuretic Peptide: 533.2 pg/mL — ABNORMAL HIGH (ref 0.0–100.0)

## 2019-02-11 LAB — TROPONIN I (HIGH SENSITIVITY): Troponin I (High Sensitivity): 168 ng/L (ref <18)

## 2019-02-11 LAB — SARS CORONAVIRUS 2 BY RT PCR (HOSPITAL ORDER, PERFORMED IN ~~LOC~~ HOSPITAL LAB): SARS Coronavirus 2: NEGATIVE

## 2019-02-11 LAB — LIPASE, BLOOD: Lipase: 34 U/L (ref 11–51)

## 2019-02-11 MED ORDER — ENOXAPARIN SODIUM 40 MG/0.4ML ~~LOC~~ SOLN
40.0000 mg | Freq: Every day | SUBCUTANEOUS | Status: DC
  Administered 2019-02-12 – 2019-02-17 (×6): 40 mg via SUBCUTANEOUS
  Filled 2019-02-11 (×6): qty 0.4

## 2019-02-11 MED ORDER — ONDANSETRON HCL 4 MG/2ML IJ SOLN: 4.0000 mg | Freq: Four times a day (QID) | INTRAMUSCULAR | Status: DC | PRN

## 2019-02-11 MED ORDER — ORAL CARE MOUTH RINSE
15.0000 mL | OROMUCOSAL | Status: DC
  Administered 2019-02-11 – 2019-02-14 (×16): 15 mL via OROMUCOSAL

## 2019-02-11 MED ORDER — METHYLPREDNISOLONE SODIUM SUCC 125 MG IJ SOLR
125.0000 mg | Freq: Once | INTRAMUSCULAR | Status: AC
  Administered 2019-02-11: 125 mg via INTRAVENOUS
  Filled 2019-02-11: qty 2

## 2019-02-11 MED ORDER — SODIUM CHLORIDE 0.9 % IV SOLN
INTRAVENOUS | Status: DC
  Administered 2019-02-11 – 2019-02-12 (×2): via INTRAVENOUS

## 2019-02-11 MED ORDER — SODIUM CHLORIDE 0.9 % IV SOLN
500.0000 mg | INTRAVENOUS | Status: AC
  Administered 2019-02-12 – 2019-02-15 (×5): 500 mg via INTRAVENOUS
  Filled 2019-02-11 (×7): qty 500

## 2019-02-11 MED ORDER — FUROSEMIDE 10 MG/ML IJ SOLN
20.0000 mg | Freq: Two times a day (BID) | INTRAMUSCULAR | Status: DC
  Administered 2019-02-11: 20 mg via INTRAVENOUS
  Filled 2019-02-11: qty 2

## 2019-02-11 MED ORDER — FENTANYL CITRATE (PF) 100 MCG/2ML IJ SOLN
50.0000 ug | Freq: Once | INTRAMUSCULAR | Status: AC
  Administered 2019-02-11: 50 ug via INTRAVENOUS
  Filled 2019-02-11: qty 2

## 2019-02-11 MED ORDER — CHLORHEXIDINE GLUCONATE CLOTH 2 % EX PADS
6.0000 | MEDICATED_PAD | Freq: Every day | CUTANEOUS | Status: DC
  Administered 2019-02-11 – 2019-02-16 (×5): 6 via TOPICAL

## 2019-02-11 MED ORDER — IPRATROPIUM-ALBUTEROL 0.5-2.5 (3) MG/3ML IN SOLN
3.0000 mL | Freq: Four times a day (QID) | RESPIRATORY_TRACT | Status: DC
  Administered 2019-02-11: 3 mL via RESPIRATORY_TRACT
  Filled 2019-02-11 (×2): qty 3

## 2019-02-11 MED ORDER — ALBUTEROL SULFATE HFA 108 (90 BASE) MCG/ACT IN AERS
2.0000 | INHALATION_SPRAY | Freq: Once | RESPIRATORY_TRACT | Status: AC
  Administered 2019-02-11: 2 via RESPIRATORY_TRACT
  Filled 2019-02-11: qty 6.7

## 2019-02-11 MED ORDER — ASPIRIN 325 MG PO TABS
325.0000 mg | ORAL_TABLET | Freq: Every day | ORAL | Status: DC
  Administered 2019-02-11 – 2019-02-15 (×5): 325 mg via ORAL
  Filled 2019-02-11 (×6): qty 1

## 2019-02-11 MED ORDER — EFAVIRENZ-EMTRICITAB-TENOFOVIR 600-200-300 MG PO TABS
1.0000 | ORAL_TABLET | Freq: Every day | ORAL | Status: DC
  Administered 2019-02-11 – 2019-02-16 (×6): 1 via ORAL
  Filled 2019-02-11 (×10): qty 1

## 2019-02-11 MED ORDER — METHYLPREDNISOLONE SODIUM SUCC 125 MG IJ SOLR
60.0000 mg | Freq: Four times a day (QID) | INTRAMUSCULAR | Status: DC
  Administered 2019-02-11 – 2019-02-13 (×6): 60 mg via INTRAVENOUS
  Filled 2019-02-11 (×6): qty 2

## 2019-02-11 MED ORDER — FUROSEMIDE 10 MG/ML IJ SOLN
40.0000 mg | Freq: Once | INTRAMUSCULAR | Status: AC
  Administered 2019-02-11: 40 mg via INTRAVENOUS
  Filled 2019-02-11: qty 4

## 2019-02-11 MED ORDER — ACETAMINOPHEN 325 MG PO TABS
650.0000 mg | ORAL_TABLET | ORAL | Status: DC | PRN
  Administered 2019-02-12 – 2019-02-17 (×16): 650 mg via ORAL
  Filled 2019-02-11 (×16): qty 2

## 2019-02-11 MED ORDER — CHLORHEXIDINE GLUCONATE 0.12% ORAL RINSE (MEDLINE KIT)
15.0000 mL | Freq: Two times a day (BID) | OROMUCOSAL | Status: DC
  Administered 2019-02-11 – 2019-02-14 (×5): 15 mL via OROMUCOSAL

## 2019-02-11 NOTE — ED Notes (Signed)
Brother, bj would like an update at [phone]

## 2019-02-11 NOTE — Progress Notes (Signed)
eLink Physician-Brief Progress Note Patient Name: KAEDYN BELARDO DOB: 1945-09-24 MRN: [id number]   Date of Service  02/11/2019  HPI/Events of Note  73 year old woman with COPD on home o2, lung cancer, HIV,  admitted to ICU for acute on chronic respiratory failure and lethargy. Hypercapnia on labs and given fentanyl, lasix, steroids, nebs in the ER. Brought to ICU for BIPAP use.   Is currently on 20/6/40% settings and seems to have improved in that she is following commands appropriately but does remain drowsy. Reviewed labs and imaging and has been seen by bedside ICU team. Seen on camera.   eICU Interventions  Planned for COPD management, azithromycin, serial troponin and echocardiogram. Follow I/O after lasix dosing. Is to continue on her home ART. 2 am ABG is ordered and other plan per bedside team.   Discussed with RN, no needs from E link at this time Please call us if needed     Intervention Category Major Interventions: Respiratory failure - evaluation and management Minor Interventions: Other: Evaluation Type: New Patient Evaluation  Margaretmary Lombard 02/11/2019, 11:10 PM

## 2019-02-11 NOTE — Progress Notes (Signed)
Placed patient on bipap due to VBG results. Patients SARS-Coronavirus 2 test did report back as negative. Will obtain arterial gas.

## 2019-02-11 NOTE — ED Provider Notes (Signed)
[facility] EMERGENCY DEPARTMENT Provider Note   CSN: [id number] Arrival date & time: 02/11/19  1736     History   Chief Complaint Chief Complaint  Patient presents with   Abdominal Pain   Shortness of Breath    HPI [name] is a 73 y.o. female hx of COPD, HIV, nonsmall cell lung cancer s/p radiation, here presenting with shortness of breath, epigastric pain.  Patient states that she is more short of breath for the last several days.  She states that she has epigastric pain as well.  She states that 3 weeks ago she was seen in urgent care and was diagnosed with COPD exacerbation and finished a course of steroids.  She also had a negative COVID test at that time.  Patient was noted to have low oxygen level per EMS but nurse did not document how low her oxygen was. She states that she uses 2 to 3 L as needed at home.     The history is provided by the patient.    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   Neuromuscular disorder ([place])    fingers/feet neuropathy   Non-small cell lung cancer ([place])    S/P radiation therapy 01/26/14-02/02/14   sbrt  lt upper lung-54Gy/62fx    Patient Active Problem List   Diagnosis Date Noted   Sepsis ([place]) 03/28/2015   CAP (community acquired pneumonia) 03/28/2015   UTI (lower urinary tract infection) 03/28/2015   Respiratory failure ([place]) 03/28/2015   Non-small cell lung cancer ([place])    Malignant neoplasm of upper lobe, bronchus or lung 01/03/2014   Lung cancer ([place]) 11/08/2013   COPD with acute exacerbation ([place]) 10/30/2013   COPD exacerbation  ([place]) 10/30/2013   Other and unspecified noninfectious gastroenteritis and colitis(558.9) 11/20/2012   Rectal bleed 11/19/2012   Obesity 05/05/2012   Renal insufficiency 05/05/2012   Hyperglycemia 05/05/2012   Asthma 12/10/2011   Cigarette smoker 12/10/2011   Hyperlipidemia 12/11/2010   Hypertension 12/11/2010   ALLERGIC RHINITIS, SEASONAL 07/03/2010   COPD 07/03/2010   Human immunodeficiency virus (HIV) disease ([place]) 12/09/2006   DEPRESSION 12/09/2006   BREAST MASS, BENIGN 12/09/2006   OSTEOPOROSIS 12/09/2006    Past Surgical History:  Procedure Laterality Date   ABCESS DRAINAGE     abcess under Left arm, abcess from L breast   removal of abnormal cells on uterus     thyroid gland removed       OB History   No obstetric history on file.      Home Medications    Prior to Admission medications   Medication Sig Start Date End Date Taking? Authorizing Provider  albuterol (PROVENTIL HFA;VENTOLIN HFA) 108 (90 BASE) MCG/ACT inhaler Inhale 1 puff into the lungs 2 (two) times daily.     [provider]  albuterol (PROVENTIL) (2.5 MG/3ML) 0.083% nebulizer solution Take 3 mLs (2.5 mg total) by nebulization every 4 (four) hours as needed for wheezing or shortness of breath. 01/18/19   Jaynee Eagles, PA-C  Ascorbic Acid (VITAMIN C) 100 MG tablet Take 100 mg  by mouth daily.    [provider]  aspirin EC 81 MG tablet Take 81 mg by mouth daily.    [provider]  ATRIPLA 600-200-300 MG tablet TAKE 1 TABLET BY MOUTH EVERY NIGHT AT BEDTIME 09/03/15   Tommy Medal, Lavell Islam, MD  B Complex-Biotin-FA (VITAMIN B50 COMPLEX PO) Take 1 tablet by mouth daily.    [provider]  Calcium Carbonate-Vitamin D (CALCIUM 600+D) 600-400 MG-UNIT per tablet Take 1 tablet by mouth daily.    [provider]  Fluticasone-Salmeterol (ADVAIR) 500-50 MCG/DOSE AEPB Inhale 1 puff into the lungs every 12 (twelve) hours.    [provider]   gabapentin (NEURONTIN) 300 MG capsule TAKE 2 CAPSULES BY MOUTH THREE TIMES A DAY Patient taking differently: Take 600 mg by mouth 3 (three) times daily.  01/14/15   Truman Hayward, MD  ibuprofen (ADVIL,MOTRIN) 600 MG tablet Take 1 tablet (600 mg total) by mouth every 8 (eight) hours as needed. 12/14/14   Jola Schmidt, MD  loratadine (CLARITIN) 10 MG tablet Take 10 mg by mouth daily.    [provider]  methocarbamol (ROBAXIN) 500 MG tablet Take 1 tablet (500 mg total) by mouth every 6 (six) hours as needed for muscle spasms. 09/15/14   [name], MD  montelukast (SINGULAIR) 10 MG tablet Take 1 tablet (10 mg total) by mouth at bedtime. 12/02/12   Michel Bickers, MD  nicotine (NICODERM CQ - DOSED IN MG/24 HOURS) 21 mg/24hr patch Place 1 patch (21 mg total) onto the skin daily. 11/06/13   Caren Griffins, MD  predniSONE (DELTASONE) 20 MG tablet Take 2 tablets daily with breakfast. 01/18/19   Jaynee Eagles, PA-C  SPIRIVA RESPIMAT 2.5 MCG/ACT AERS INHALE 2 PUFFS INTO THE LUNGS DAILY Patient taking differently: Inhale 2 puffs into the lungs daily.  09/24/14   Rigoberto Noel, MD  venlafaxine XR (EFFEXOR-XR) 150 MG 24 hr capsule Take 150 mg by mouth daily with breakfast.     [provider]    Family History Family History  Problem Relation Age of Onset   Pneumonia Mother    Hypertension Sister    Diabetes Sister    Cancer Sister        pancreatic and lung   Cancer Brother        pancreatic   Cancer Brother        pancreatic    Social History Social History   Tobacco Use   Smoking status: Current Every Day Smoker    Packs/day: 1.00    Years: 40.00    Pack years: 40.00    Types: Cigarettes   Smokeless tobacco: Never Used   Tobacco comment: quit date the day she starts her radiation at the [facility]  Substance Use Topics   Alcohol use: No    Alcohol/week: 0.0 standard drinks    Comment: none in 20 years   Drug use: No    Comment: none in 20 years      Allergies   Patient has no known allergies.   Review of Systems Review of Systems  Respiratory: Positive for shortness of breath.   Gastrointestinal: Positive for abdominal pain.  All other systems reviewed and are negative.    Physical Exam Updated Vital Signs BP (!) 148/77 (BP Location: Right Arm)    Pulse 88    Temp 99.3 F (37.4 C) (Oral)    Resp 20    Ht 5\' 9"  (1.753 m)    Wt  102 kg    SpO2 99%    BMI 33.21 kg/m   Physical Exam Vitals signs and nursing note reviewed.  Constitutional:      Comments: Tired, difficult to arouse, able to give short answers, tachypneic   HENT:     Head: Normocephalic.  Eyes:     Extraocular Movements: Extraocular movements intact.  Cardiovascular:     Rate and Rhythm: Normal rate and regular rhythm.     Heart sounds: Normal heart sounds.  Pulmonary:     Comments: Tachypneic, mild diffuse wheezing, diminished bilateral bases  Abdominal:     General: Abdomen is flat.     Comments: + epigastric tenderness   Skin:    General: Skin is warm.     Capillary Refill: Capillary refill takes less than 2 seconds.     Comments: 1+ edema bilaterally   Neurological:     General: No focal deficit present.     Mental Status: She is oriented to person, place, and time.  Psychiatric:        Mood and Affect: Mood normal.        Behavior: Behavior normal.      ED Treatments / Results  Labs (all labs ordered are listed, but only abnormal results are displayed) Labs Reviewed  CBC WITH DIFFERENTIAL/PLATELET - Abnormal; Notable for the following components:      Result Value   RBC 6.34 (*)    HCT 54.0 (*)    MCH 22.7 (*)    MCHC 26.7 (*)    RDW 21.5 (*)    nRBC 2.6 (*)    Abs Immature Granulocytes 0.09 (*)    All other components within normal limits  COMPREHENSIVE METABOLIC PANEL - Abnormal; Notable for the following components:   Glucose, Bld 122 (*)    Calcium 8.6 (*)    Albumin 3.2 (*)    AST 64 (*)    All other components within  normal limits  I-STAT CHEM 8, ED - Abnormal; Notable for the following components:   Glucose, Bld 113 (*)    TCO2 35 (*)    Hemoglobin 17.7 (*)    HCT 52.0 (*)    All other components within normal limits  POCT I-STAT EG7 - Abnormal; Notable for the following components:   pCO2, Ven 79.6 (*)    pO2, Ven 60.0 (*)    Bicarbonate 36.5 (*)    TCO2 39 (*)    Acid-Base Excess 5.0 (*)    HCT 55.0 (*)    Hemoglobin 18.7 (*)    All other components within normal limits  TROPONIN I (HIGH SENSITIVITY) - Abnormal; Notable for the following components:   Troponin I (High Sensitivity) 168 (*)    All other components within normal limits  SARS CORONAVIRUS 2 (HOSPITAL ORDER, [facility])  LIPASE, BLOOD  BLOOD GAS, VENOUS  BRAIN NATRIURETIC PEPTIDE  TROPONIN I (HIGH SENSITIVITY)    EKG EKG Interpretation  Date/Time:  Saturday February 11 2019 17:57:51 EDT Ventricular Rate:  87 PR Interval:    QRS Duration: 89 QT Interval:  376 QTC Calculation: 453 R Axis:   57 Text Interpretation:  Sinus rhythm Consider right atrial enlargement No significant change since last tracing Confirmed by Wandra Arthurs ([id number]) on 02/11/2019 7:22:58 PM   Radiology Dg Chest Port 1 View  Result Date: 02/11/2019 CLINICAL DATA:  Shortness of breath. EXAM: PORTABLE CHEST 1 VIEW COMPARISON:  Chest x-rays dated 03/29/2015 and 01/11/2012. FINDINGS: Borderline cardiomegaly  is stable. Fullness of the LEFT aortopulmonary window region, most likely a prominent main pulmonary artery suggesting chronic pulmonary artery hypertension. Increased interstitial prominence bilaterally, bibasilar predominant, LEFT slightly greater than RIGHT, presumably mild edema superimposed on chronic interstitial lung disease. No confluent opacity to suggest a consolidating pneumonia. No pleural effusion or pneumothorax seen. Osseous structures about the chest are unremarkable. IMPRESSION: 1. Probable mild bilateral interstitial  edema superimposed on chronic interstitial lung disease. 2. No evidence of consolidating pneumonia or alveolar pulmonary edema. 3. Probable chronic pulmonary artery hypertension. 4. Stable borderline cardiomegaly. Electronically Signed   By: Franki Cabot M.D.   On: 02/11/2019 18:12    Procedures Procedures (including critical care time)\  CRITICAL CARE Performed by: Wandra Arthurs   Total critical care time: 30 minutes  Critical care time was exclusive of separately billable procedures and treating other patients.  Critical care was necessary to treat or prevent imminent or life-threatening deterioration.  Critical care was time spent personally by me on the following activities: development of treatment plan with patient and/or surrogate as well as nursing, discussions with consultants, evaluation of patient's response to treatment, examination of patient, obtaining history from patient or surrogate, ordering and performing treatments and interventions, ordering and review of laboratory studies, ordering and review of radiographic studies, pulse oximetry and re-evaluation of patient's condition.  Angiocath insertion Performed by: Wandra Arthurs  Consent: Verbal consent obtained. Risks and benefits: risks, benefits and alternatives were discussed Time out: Immediately prior to procedure a "time out" was called to verify the correct patient, procedure, equipment, support staff and site/side marked as required.  Preparation: Patient was prepped and draped in the usual sterile fashion.  Vein Location: R brachial   Ultrasound Guided  Gauge: 20 long   Normal blood return and flush without difficulty Patient tolerance: Patient tolerated the procedure well with no immediate complications.    Medications Ordered in ED Medications  furosemide (LASIX) injection 40 mg (has no administration in time range)  methylPREDNISolone sodium succinate (SOLU-MEDROL) 125 mg/2 mL injection 125 mg (125 mg  Intravenous Given 02/11/19 1908)  fentaNYL (SUBLIMAZE) injection 50 mcg (50 mcg Intravenous Given 02/11/19 1908)  albuterol (VENTOLIN HFA) 108 (90 Base) MCG/ACT inhaler 2 puff (2 puffs Inhalation Given 02/11/19 2016)     Initial Impression / Assessment and Plan / ED Course  I have reviewed the triage vital signs and the nursing notes.  Pertinent labs & imaging results that were available during my care of the patient were reviewed by me and considered in my medical decision making (see chart for details).       [name] is a 73 y.o. female history of COPD who presented with shortness of breath.  Patient appears very confused and altered.  She has some mild wheezing and crackles on exam. She is on 2-3 L as needed at home. Will get labs, VBG, CXR, COVID.   8:32 PM VBG showed pH 7.26. CO2 79. Patient given steroids, albuterol. Now more sleepy and more difficult to arouse. COVID negative. Will start bipap.   10 pm Critical care at bedside. Repeat ABG still the same. Critical care to admit for hypercapnea from COPD, possible CHF.    Final Clinical Impressions(s) / ED Diagnoses   Final diagnoses:  None    ED Discharge Orders    None       Drenda Freeze, MD 02/11/19 2325

## 2019-02-11 NOTE — ED Triage Notes (Signed)
The pt arrived by [facility] from [facility] on home 02  sata low on 2 liters  Pt is visibly sob

## 2019-02-11 NOTE — ED Notes (Signed)
Sister, Jeanett Schlein, called wanting an update at [phone]

## 2019-02-11 NOTE — Progress Notes (Signed)
Arterial results given to Dr.Yao.

## 2019-02-11 NOTE — H&P (Addendum)
NAME:  [name], MRN:  [id number], DOB:  07-Nov-1945, LOS: 0 ADMISSION DATE:  02/11/2019, CONSULTATION DATE:  02/11/19 REFERRING MD:  Darl Householder, CHIEF COMPLAINT:  Dyspnea  Brief History   56yF with history of COPD, HIV, NSCLC s/p XRT admitted with likely multifactorial acute on chronic hypercapnic and hypoxic respiratory failure    History of present illness   [name] is a 73 y.o. female hx of COPD, HIV, nonsmall cell lung cancer s/p radiation, here presenting with shortness of breath, epigastric pain.  Much of the history is gathered by chart review as she is unable to attend to conversation at the time of the interview. She apparently had epigastric pain and dyspnea for a few days preceding her admission.   Sister knows that recently she's had trouble breathing according to conversation with her neighbor but has not spoken with her since Sunday. She says that she lives by herself and that she is normally independent in all her activities at home, attends well to conversation.  Past Medical History  HIV Stage IA unresectable lung cancer s/p XRT 2015 COPD FEV1 51%  Significant Hospital Events   In ED received fentanyl, albuterol, 125 solumedrol and lasix 40 IV  Consults:  None  Procedures:  None  Significant Diagnostic Tests:  CXR reviewed and remarkable for increased dependent opacities relative to prior  Micro Data:  covid negative, no prior resistant organisms  Antimicrobials:  Azithromycin  Interim history/subjective:  N/A  Objective   Blood pressure (!) 142/85, pulse 77, temperature 99.3 F (37.4 C), temperature source Oral, resp. rate 20, height 5\' 9"  (1.753 m), weight 102 kg, SpO2 97 %.       No intake or output data in the 24 hours ending 02/11/19 2203 Filed Weights   02/11/19 1738  Weight: 102 kg    Examination: General: drowsy but arousable with loud verbal stimulation, unable to attend to conversation HENT: PERRL, dry MM, no palpable LAD   Lungs: rhonchorous, biphasic wheeze Cardiovascular: RRR, no murmur Abdomen: soft, NT, NABS Extremities: warm, trace edema Neuro: grossly nonfocal, inattentive as above and unable to follow commands  Resolved Hospital Problem list   N/A  Assessment & Plan:  # Acute on chronic hypercapnic and hypoxic respiratory failure: Probably multifactorial but elevated BNP and trop increase suspicion for decompensated CHF leading to alveolar hypoventilation, AECOPD and OHS/OSA alternative or superimposed possibilities. Also got some fentanyl in ED which may be contributing to persistent acute component of hypercapnia. Although she has history of cancer, she is not tachycardic or substantially hypoxic above baseline, will assess response to below measures before pursuing further workup of PE. - LDH in case this represents PJP PNA although acuity of presentation seems to argue against it - check RVP, BCx, PJP DFA if ultimately able to get a sputum specimen, TSH/fT4, NH3 - duonebs q4, albuterol q2 prn - brovana BID - solumedrol 60 q6 for now - start azithromycin - cautious trial of BiPAP, will reassess vbg and mental status at 0200 - assess response to lasix IV 40, redose for goal net negative 1L over next 24h  # Acute encephalopathy: grossly nonfocal on exam, may represent med effect (fentanyl) given apparent deterioration after intial presentation and multifactorial hypercapnia. - TSH/fT4, NH3, mgmt of hypercapnia as above  - reassess mental status after   # Troponinemia: may be demand in setting of decompensated CHF or advancing chronic lung disease. Lower suspicion for ACS at present. - s/p full dose ASA in ED -  trend trop   - formal TTE in AM   # HIV:  - check CD4, viral load - continue home ART  Best practice:  Diet: NPO while requiring rescue BiPAP Pain/Anxiety/Delirium protocol (if indicated): n/a VAP protocol (if indicated): n/a DVT prophylaxis: lovenox 40 GI prophylaxis: not indicated  Glucose control: SSI Mobility: bed-level Code Status: Full, confirmed with sister Rev Family Communication: Sister Rev, Tomasa Hosteller both willing to be involved in medical decision-making Disposition: admit to ICU  Labs   CBC: Recent Labs  Lab 02/11/19 1919 02/11/19 1924  WBC  --  8.7  NEUTROABS  --  6.3  HGB 18.7*  17.7* 14.4  HCT 55.0*  52.0* 54.0*  MCV  --  85.2  PLT  --  425    Basic Metabolic Panel: Recent Labs  Lab 02/11/19 1919 02/11/19 1924  NA 142  142 139  K 3.7  3.8 4.0  CL 98 98  CO2  --  31  GLUCOSE 113* 122*  BUN 14 12  CREATININE 0.90 0.94  CALCIUM  --  8.6*   GFR: Estimated Creatinine Clearance: 69.8 mL/min (by C-G formula based on SCr of 0.94 mg/dL). Recent Labs  Lab 02/11/19 1924  WBC 8.7    Liver Function Tests: Recent Labs  Lab 02/11/19 1924  AST 64*  ALT 39  ALKPHOS 68  BILITOT 1.1  PROT 7.2  ALBUMIN 3.2*   Recent Labs  Lab 02/11/19 1924  LIPASE 34   No results for input(s): AMMONIA in the last 168 hours.  ABG    Component Value Date/Time   HCO3 36.5 (H) 02/11/2019 1919   TCO2 35 (H) 02/11/2019 1919   TCO2 39 (H) 02/11/2019 1919   O2SAT 85.0 02/11/2019 1919     Coagulation Profile: No results for input(s): INR, PROTIME in the last 168 hours.  Cardiac Enzymes: No results for input(s): CKTOTAL, CKMB, CKMBINDEX, TROPONINI in the last 168 hours.  HbA1C: No results found for: HGBA1C  CBG: No results for input(s): GLUCAP in the last 168 hours.  Review of Systems:   Unable to obtain due to depressed level of arousal  Past Medical History  She,  has a past medical history of Asthma, Atypical squamous cells of undetermined significance (ASCUS) on Papanicolaou smear of cervix, COPD (chronic obstructive pulmonary disease) ([place]), H/O drainage of abscess, Hep B w/o coma, HIV (human immunodeficiency virus infection) ([place]), Hyperlipidemia, Lung cancer ([place]) (12/14/13), Neuromuscular disorder ([place]), Non-small cell lung cancer  ([place]), and S/P radiation therapy (01/26/14-02/02/14).   Surgical History    Past Surgical History:  Procedure Laterality Date  . ABCESS DRAINAGE     abcess under Left arm, abcess from L breast  . removal of abnormal cells on uterus    . thyroid gland removed       Social History   reports that she has been smoking cigarettes. She has a 40.00 pack-year smoking history. She has never used smokeless tobacco. She reports that she does not drink alcohol or use drugs.   Family History   Her family history includes Cancer in her brother, brother, and sister; Diabetes in her sister; Hypertension in her sister; Pneumonia in her mother.   Allergies No Known Allergies   Home Medications  Prior to Admission medications   Medication Sig Start Date End Date Taking? Authorizing Provider  albuterol (PROVENTIL HFA;VENTOLIN HFA) 108 (90 BASE) MCG/ACT inhaler Inhale 1 puff into the lungs 2 (two) times daily.     [provider]  albuterol (  PROVENTIL) (2.5 MG/3ML) 0.083% nebulizer solution Take 3 mLs (2.5 mg total) by nebulization every 4 (four) hours as needed for wheezing or shortness of breath. 01/18/19   Jaynee Eagles, PA-C  Ascorbic Acid (VITAMIN C) 100 MG tablet Take 100 mg by mouth daily.    [provider]  aspirin EC 81 MG tablet Take 81 mg by mouth daily.    [provider]  ATRIPLA 600-200-300 MG tablet TAKE 1 TABLET BY MOUTH EVERY NIGHT AT BEDTIME 09/03/15   Tommy Medal, Lavell Islam, MD  B Complex-Biotin-FA (VITAMIN B50 COMPLEX PO) Take 1 tablet by mouth daily.    [provider]  Calcium Carbonate-Vitamin D (CALCIUM 600+D) 600-400 MG-UNIT per tablet Take 1 tablet by mouth daily.    [provider]  Fluticasone-Salmeterol (ADVAIR) 500-50 MCG/DOSE AEPB Inhale 1 puff into the lungs every 12 (twelve) hours.    [provider]  gabapentin (NEURONTIN) 300 MG capsule TAKE 2 CAPSULES BY MOUTH THREE TIMES A DAY Patient taking differently: Take 600 mg by  mouth 3 (three) times daily.  01/14/15   Truman Hayward, MD  ibuprofen (ADVIL,MOTRIN) 600 MG tablet Take 1 tablet (600 mg total) by mouth every 8 (eight) hours as needed. 12/14/14   Jola Schmidt, MD  loratadine (CLARITIN) 10 MG tablet Take 10 mg by mouth daily.    [provider]  methocarbamol (ROBAXIN) 500 MG tablet Take 1 tablet (500 mg total) by mouth every 6 (six) hours as needed for muscle spasms. 09/15/14   [name], MD  montelukast (SINGULAIR) 10 MG tablet Take 1 tablet (10 mg total) by mouth at bedtime. 12/02/12   Michel Bickers, MD  nicotine (NICODERM CQ - DOSED IN MG/24 HOURS) 21 mg/24hr patch Place 1 patch (21 mg total) onto the skin daily. 11/06/13   Caren Griffins, MD  predniSONE (DELTASONE) 20 MG tablet Take 2 tablets daily with breakfast. 01/18/19   Jaynee Eagles, PA-C  SPIRIVA RESPIMAT 2.5 MCG/ACT AERS INHALE 2 PUFFS INTO THE LUNGS DAILY Patient taking differently: Inhale 2 puffs into the lungs daily.  09/24/14   Rigoberto Noel, MD  venlafaxine XR (EFFEXOR-XR) 150 MG 24 hr capsule Take 150 mg by mouth daily with breakfast.     [provider]     Critical care time: 60 minutes

## 2019-02-12 ENCOUNTER — Inpatient Hospital Stay (HOSPITAL_COMMUNITY): Payer: Medicare Other

## 2019-02-12 DIAGNOSIS — Z9989 Dependence on other enabling machines and devices: Secondary | ICD-10-CM

## 2019-02-12 DIAGNOSIS — G9341 Metabolic encephalopathy: Secondary | ICD-10-CM

## 2019-02-12 DIAGNOSIS — Z6832 Body mass index (BMI) 32.0-32.9, adult: Secondary | ICD-10-CM

## 2019-02-12 DIAGNOSIS — E6609 Other obesity due to excess calories: Secondary | ICD-10-CM

## 2019-02-12 DIAGNOSIS — J441 Chronic obstructive pulmonary disease with (acute) exacerbation: Secondary | ICD-10-CM

## 2019-02-12 DIAGNOSIS — J9622 Acute and chronic respiratory failure with hypercapnia: Secondary | ICD-10-CM

## 2019-02-12 DIAGNOSIS — Z21 Asymptomatic human immunodeficiency virus [HIV] infection status: Secondary | ICD-10-CM

## 2019-02-12 DIAGNOSIS — I361 Nonrheumatic tricuspid (valve) insufficiency: Secondary | ICD-10-CM

## 2019-02-12 DIAGNOSIS — J9621 Acute and chronic respiratory failure with hypoxia: Secondary | ICD-10-CM

## 2019-02-12 LAB — POCT I-STAT 7, (LYTES, BLD GAS, ICA,H+H)
Acid-Base Excess: 4 mmol/L — ABNORMAL HIGH (ref 0.0–2.0)
Bicarbonate: 33.4 mmol/L — ABNORMAL HIGH (ref 20.0–28.0)
Calcium, Ion: 1.15 mmol/L (ref 1.15–1.40)
HCT: 52 % — ABNORMAL HIGH (ref 36.0–46.0)
Hemoglobin: 17.7 g/dL — ABNORMAL HIGH (ref 12.0–15.0)
O2 Saturation: 92 %
Patient temperature: 97.7
Potassium: 3.7 mmol/L (ref 3.5–5.1)
Sodium: 141 mmol/L (ref 135–145)
TCO2: 35 mmol/L — ABNORMAL HIGH (ref 22–32)
pCO2 arterial: 63.7 mmHg — ABNORMAL HIGH (ref 32.0–48.0)
pH, Arterial: 7.325 — ABNORMAL LOW (ref 7.350–7.450)
pO2, Arterial: 71 mmHg — ABNORMAL LOW (ref 83.0–108.0)

## 2019-02-12 LAB — ECHOCARDIOGRAM COMPLETE
Height: 69 in
Weight: 3474.45 oz

## 2019-02-12 LAB — CBC
HCT: 51.1 % — ABNORMAL HIGH (ref 36.0–46.0)
Hemoglobin: 13.8 g/dL (ref 12.0–15.0)
MCH: 22.8 pg — ABNORMAL LOW (ref 26.0–34.0)
MCHC: 27 g/dL — ABNORMAL LOW (ref 30.0–36.0)
MCV: 84.6 fL (ref 80.0–100.0)
Platelets: 157 10*3/uL (ref 150–400)
RBC: 6.04 MIL/uL — ABNORMAL HIGH (ref 3.87–5.11)
RDW: 21.9 % — ABNORMAL HIGH (ref 11.5–15.5)
WBC: 6.3 10*3/uL (ref 4.0–10.5)
nRBC: 1.3 % — ABNORMAL HIGH (ref 0.0–0.2)

## 2019-02-12 LAB — RESPIRATORY PANEL BY PCR

## 2019-02-12 LAB — MRSA PCR SCREENING: MRSA by PCR: NEGATIVE

## 2019-02-12 LAB — LACTATE DEHYDROGENASE: LDH: 239 U/L — ABNORMAL HIGH (ref 98–192)

## 2019-02-12 LAB — CREATININE, SERUM
Creatinine, Ser: 1.12 mg/dL — ABNORMAL HIGH (ref 0.44–1.00)
GFR calc Af Amer: 57 mL/min — ABNORMAL LOW (ref >60)
GFR calc non Af Amer: 49 mL/min — ABNORMAL LOW (ref >60)

## 2019-02-12 LAB — TROPONIN I (HIGH SENSITIVITY): Troponin I (High Sensitivity): 115 ng/L (ref <18)

## 2019-02-12 LAB — TSH: TSH: 1.152 u[IU]/mL (ref 0.350–4.500)

## 2019-02-12 MED ORDER — ARFORMOTEROL TARTRATE 15 MCG/2ML IN NEBU
15.0000 ug | INHALATION_SOLUTION | Freq: Two times a day (BID) | RESPIRATORY_TRACT | Status: DC
  Administered 2019-02-12 – 2019-02-17 (×12): 15 ug via RESPIRATORY_TRACT
  Filled 2019-02-12 (×12): qty 2

## 2019-02-12 MED ORDER — CHLORHEXIDINE GLUCONATE 0.12 % MT SOLN
OROMUCOSAL | Status: AC
  Filled 2019-02-12: qty 15

## 2019-02-12 MED ORDER — POTASSIUM CHLORIDE CRYS ER 20 MEQ PO TBCR
40.0000 meq | EXTENDED_RELEASE_TABLET | Freq: Once | ORAL | Status: AC
  Administered 2019-02-12: 10:00:00 40 meq via ORAL
  Filled 2019-02-12: qty 2

## 2019-02-12 MED ORDER — FUROSEMIDE 10 MG/ML IJ SOLN
40.0000 mg | Freq: Once | INTRAMUSCULAR | Status: AC
  Administered 2019-02-12: 40 mg via INTRAVENOUS
  Filled 2019-02-12: qty 4

## 2019-02-12 MED ORDER — IPRATROPIUM-ALBUTEROL 0.5-2.5 (3) MG/3ML IN SOLN
3.0000 mL | Freq: Four times a day (QID) | RESPIRATORY_TRACT | Status: DC
  Administered 2019-02-12 – 2019-02-16 (×18): 3 mL via RESPIRATORY_TRACT
  Filled 2019-02-12 (×17): qty 3

## 2019-02-12 NOTE — Progress Notes (Signed)
Patient taken off of bipap and placed on 4L nasal cannula.  Currently tolerating well.  Will continue to monitor.

## 2019-02-12 NOTE — Progress Notes (Signed)
Lab attempted to draw blood X3 unsuccessfully. Alerted Elink. No changes at this time.

## 2019-02-12 NOTE — Progress Notes (Signed)
  Echocardiogram 2D Echocardiogram has been performed.  [name] 02/12/2019, 12:54 PM

## 2019-02-12 NOTE — Progress Notes (Signed)
Pt. Asking for food, RN educated Pt on current NPO diet. Pt. Asked for pocket book to look for phone. When RN re-entered the room there was candy wrappers in bed. Pt. States she did not eat candy and RN removed candy from bed and educated pt, again on current NPO diet.

## 2019-02-12 NOTE — Progress Notes (Addendum)
NAME:  [name], MRN:  [id number], DOB:  October 03, 1945, LOS: 1 ADMISSION DATE:  02/11/2019, CONSULTATION DATE:  02/11/19 REFERRING MD:  Darl Householder, CHIEF COMPLAINT:  Dyspnea  Brief History   48yF with history of COPD, HIV, NSCLC s/p XRT admitted with likely multifactorial acute on chronic hypercapnic and hypoxic respiratory failure    History of present illness   [name] is a 73 y.o. female hx of COPD, HIV, nonsmall cell lung cancer s/p radiation, here presenting with shortness of breath, epigastric pain.  Much of the history is gathered by chart review as she is unable to attend to conversation at the time of the interview. She apparently had epigastric pain and dyspnea for a few days preceding her admission.   Sister knows that recently she's had trouble breathing according to conversation with her neighbor but has not spoken with her since Sunday. She says that she lives by herself and that she is normally independent in all her activities at home, attends well to conversation.  Past Medical History  HIV Stage IA unresectable lung cancer s/p XRT 2015 COPD FEV1 51%  Significant Hospital Events   In ED received fentanyl, albuterol, 125 solumedrol and lasix 40 IV  Consults:  None  Procedures:  None  Significant Diagnostic Tests:  CXR reviewed 8/16>> Heart size is normal. No pleural effusion/ edema. Stable chronic scarring within the left midlung. No superimposed airspace consolidation.  Micro Data:  covid negative, no prior resistant organisms  Antimicrobials:  Azithromycin  Interim history/subjective:  N/A  Objective   Blood pressure 93/81, pulse 72, temperature 98 F (36.7 C), temperature source Oral, resp. rate 14, height 5\' 9"  (1.753 m), weight 98.5 kg, SpO2 92 %.    FiO2 (%):  [40 %] 40 %   Intake/Output Summary (Last 24 hours) at 02/12/2019 0902 Last data filed at 02/12/2019 0800 Gross per 24 hour  Intake 622.24 ml  Output 650 ml  Net -27.76 ml   Filed  Weights   02/11/19 1738 02/12/19 0453  Weight: 102 kg 98.5 kg    Examination: General: Awake and alert, asking  to eat HENT: NCAT, PERRL, dry MM, no palpable LAD, trachea midline  Lungs: Bilateral chest excursion, Coarse, diminished per bases, No wheeze upon my exam Cardiovascular: S1, S2, RRR, no murmur, rub or gallop Abdomen: soft, NT, ND,BS +, Body mass index is 32.07 kg/m. Extremities: warm, trace edema, SCD's in place Neuro: grossly nonfocal, awake and alert, following commands  Resolved Hospital Problem list   N/A  Assessment & Plan:  # Acute on chronic hypercapnic and hypoxic respiratory failure: Probably multifactorial but elevated BNP and trop increase suspicion for decompensated CHF leading to alveolar hypoventilation,  AECOPD  Current every day smoker with a 40 pack year smoking history and OHS/OSA alternative or superimposed possibilities. Also got some fentanyl in ED which may be contributing to persistent acute component of hypercapnia.  Although she has history of cancer, she is not tachycardic or substantially hypoxic above baseline, will assess response to below measures before pursuing further workup of PE. RVP negative,  - LDH in case this represents PJP PNA although acuity of presentation seems to argue against it - check, BCx, PJP DFA if ultimately able to get a sputum specimen, TSH/fT4, NH3 - duonebs q4, albuterol q2 prn - brovana BID - solumedrol 60 q 6 for now, decrease 8/17 am as she is improving - start azithromycin - Will allow periods off BiPAP, mandatory at bedtime - Lasix 40  x 1 as only net negative of 22 cc's - Follow CXR and LDH - Will need OP follow up to assess OSA and need for CPAP/ BiPAP therapy  # Acute encephalopathy: Improved 8/16  am after BiPAP therapy grossly nonfocal on exam, suspect represents med effect (fentanyl) given apparent deterioration after intial presentation and multifactorial hypercapnia.>> Additionally pt is on multiple  sedating medications at home - TSH/fT4, NH3, mgmt of hypercapnia as above  - Minimize sedation   # Troponinemia: may be demand in setting of decompensated CHF or advancing chronic lung disease. Lower suspicion for ACS at present. - continue daily asa  - trend trop   - formal TTE in AM   # HIV:  - check CD4, viral load - continue home ART  Home Polypharmacy Pt is on Robaxin, Gabapentin, Seroquel all of which could be suppressing respiration when asleep Will need to re-evaluate dosage to minimize sedation   Tobacco Abuse Tobacco cessation counseling Pulmonary Follow up with PFT's  Best practice:  Diet:  Pain/Anxiety/Delirium protocol (if indicated): n/a VAP protocol (if indicated): n/a DVT prophylaxis: lovenox 40 GI prophylaxis: not indicated Glucose control: SSI Mobility: bed-level Code Status: Full, confirmed with sister Rev Family Communication: Sister Myrtha Mantis both willing to be involved in medical decision-making Disposition: admit to ICU  Labs   CBC: Recent Labs  Lab 02/11/19 1919 02/11/19 1924 02/11/19 2214 02/12/19 0224 02/12/19 0435  WBC  --  8.7  --   --  6.3  NEUTROABS  --  6.3  --   --   --   HGB 18.7*  17.7* 14.4 18.4* 17.7* 13.8  HCT 55.0*  52.0* 54.0* 54.0* 52.0* 51.1*  MCV  --  85.2  --   --  84.6  PLT  --  181  --   --  626    Basic Metabolic Panel: Recent Labs  Lab 02/11/19 1919 02/11/19 1924 02/11/19 2214 02/12/19 0224 02/12/19 0435  NA 142  142 139 140 141  --   K 3.7  3.8 4.0 3.8 3.7  --   CL 98 98  --   --   --   CO2  --  31  --   --   --   GLUCOSE 113* 122*  --   --   --   BUN 14 12  --   --   --   CREATININE 0.90 0.94  --   --  1.12*  CALCIUM  --  8.6*  --   --   --    GFR: Estimated Creatinine Clearance: 57.5 mL/min (A) (by C-G formula based on SCr of 1.12 mg/dL (H)). Recent Labs  Lab 02/11/19 1924 02/12/19 0435  WBC 8.7 6.3    Liver Function Tests: Recent Labs  Lab 02/11/19 1924  AST 64*  ALT 39   ALKPHOS 68  BILITOT 1.1  PROT 7.2  ALBUMIN 3.2*   Recent Labs  Lab 02/11/19 1924  LIPASE 34   No results for input(s): AMMONIA in the last 168 hours.  ABG    Component Value Date/Time   PHART 7.325 (L) 02/12/2019 0224   PCO2ART 63.7 (H) 02/12/2019 0224   PO2ART 71.0 (L) 02/12/2019 0224   HCO3 33.4 (H) 02/12/2019 0224   TCO2 35 (H) 02/12/2019 0224   O2SAT 92.0 02/12/2019 0224     Coagulation Profile: No results for input(s): INR, PROTIME in the last 168 hours.  Cardiac Enzymes: No results for input(s): CKTOTAL, CKMB, CKMBINDEX, TROPONINI in the  last 168 hours.  HbA1C: No results found for: HGBA1C  CBG: No results for input(s): GLUCAP in the last 168 hours.  Review of Systems:   Unable to obtain due to depressed level of arousal  Past Medical History  She,  has a past medical history of Asthma, Atypical squamous cells of undetermined significance (ASCUS) on Papanicolaou smear of cervix, COPD (chronic obstructive pulmonary disease) ([place]), H/O drainage of abscess, Hep B w/o coma, HIV (human immunodeficiency virus infection) ([place]), Hyperlipidemia, Lung cancer ([place]) (12/14/13), Neuromuscular disorder ([place]), Non-small cell lung cancer ([place]), and S/P radiation therapy (01/26/14-02/02/14).   Surgical History    Past Surgical History:  Procedure Laterality Date  . ABCESS DRAINAGE     abcess under Left arm, abcess from L breast  . removal of abnormal cells on uterus    . thyroid gland removed       Social History   reports that she has been smoking cigarettes. She has a 40.00 pack-year smoking history. She has never used smokeless tobacco. She reports that she does not drink alcohol or use drugs.   Family History   Her family history includes Cancer in her brother, brother, and sister; Diabetes in her sister; Hypertension in her sister; Pneumonia in her mother.   Allergies No Known Allergies   Home Medications  Prior to Admission medications   Medication Sig Start Date  End Date Taking? Authorizing Provider  albuterol (PROVENTIL HFA;VENTOLIN HFA) 108 (90 BASE) MCG/ACT inhaler Inhale 1 puff into the lungs 2 (two) times daily.     [provider]  albuterol (PROVENTIL) (2.5 MG/3ML) 0.083% nebulizer solution Take 3 mLs (2.5 mg total) by nebulization every 4 (four) hours as needed for wheezing or shortness of breath. 01/18/19   Jaynee Eagles, PA-C  Ascorbic Acid (VITAMIN C) 100 MG tablet Take 100 mg by mouth daily.    [provider]  aspirin EC 81 MG tablet Take 81 mg by mouth daily.    [provider]  ATRIPLA 600-200-300 MG tablet TAKE 1 TABLET BY MOUTH EVERY NIGHT AT BEDTIME 09/03/15   Tommy Medal, Lavell Islam, MD  B Complex-Biotin-FA (VITAMIN B50 COMPLEX PO) Take 1 tablet by mouth daily.    [provider]  Calcium Carbonate-Vitamin D (CALCIUM 600+D) 600-400 MG-UNIT per tablet Take 1 tablet by mouth daily.    [provider]  Fluticasone-Salmeterol (ADVAIR) 500-50 MCG/DOSE AEPB Inhale 1 puff into the lungs every 12 (twelve) hours.    [provider]  gabapentin (NEURONTIN) 300 MG capsule TAKE 2 CAPSULES BY MOUTH THREE TIMES A DAY Patient taking differently: Take 600 mg by mouth 3 (three) times daily.  01/14/15   Truman Hayward, MD  ibuprofen (ADVIL,MOTRIN) 600 MG tablet Take 1 tablet (600 mg total) by mouth every 8 (eight) hours as needed. 12/14/14   Jola Schmidt, MD  loratadine (CLARITIN) 10 MG tablet Take 10 mg by mouth daily.    [provider]  methocarbamol (ROBAXIN) 500 MG tablet Take 1 tablet (500 mg total) by mouth every 6 (six) hours as needed for muscle spasms. 09/15/14   [name], MD  montelukast (SINGULAIR) 10 MG tablet Take 1 tablet (10 mg total) by mouth at bedtime. 12/02/12   Michel Bickers, MD  nicotine (NICODERM CQ - DOSED IN MG/24 HOURS) 21 mg/24hr patch Place 1 patch (21 mg total) onto the skin daily. 11/06/13   Caren Griffins, MD  predniSONE (DELTASONE) 20 MG tablet Take 2 tablets  daily with breakfast.  01/18/19   Jaynee Eagles, PA-C  SPIRIVA RESPIMAT 2.5 MCG/ACT AERS INHALE 2 PUFFS INTO THE LUNGS DAILY Patient taking differently: Inhale 2 puffs into the lungs daily.  09/24/14   Rigoberto Noel, MD  venlafaxine XR (EFFEXOR-XR) 150 MG 24 hr capsule Take 150 mg by mouth daily with breakfast.     [provider]     Critical care time:  minutes    Magdalen Spatz, AGACNP-BC [place] Pager # [phone] After 4 pm please call [phone] 02/12/2019 9:03 AM    PCCM attending:  73 year old female longstanding history of smoking, history of HIV, history of non-small cell lung cancer status post radiation therapy admitted to the intensive care unit for respiratory failure found to be hypercarbic.  Doing better on BIPAP this morning. Asking to sit up in chair.   BP (!) 107/59   Pulse 72   Temp 98 F (36.7 C) (Oral)   Resp (!) 23   Ht 5\' 9"  (1.753 m)   Wt 98.5 kg   SpO2 93%   BMI 32.07 kg/m   Gen: obese, FM, resting in bed on BIPAP Neck: large Chest: BL exp wheeze, no crackles Heart: RRR, s1 s2  Abd: soft, nt, obese pannus   Labs reviewed  ABG better  RVAP Neg   A: Acute hypoxemic hypercarbic reps failure  Acute on chronic diastolic heart failure, echo pending  OHS/OSA AECOPD  Acute metabolic encephalopathy 2/2 hypoercarbia  HIV   P: BIPAP Diuresis  Steroids  Scheduled nebs  Azithromycin X 5 days  Avoid polypharmacy sedating meds  Continue home ART  This patient is critically ill with multiple organ system failure; which, requires frequent high complexity decision making, assessment, support, evaluation, and titration of therapies. This was completed through the application of advanced monitoring technologies and extensive interpretation of multiple databases. During this encounter critical care time was devoted to patient care services described in this note for 33 minutes.  Garner Nash, DO   Pulmonary Critical Care 02/12/2019 10:40 AM  Personal pager: [phone] If unanswered, please page CCM On-call: [phone]

## 2019-02-12 NOTE — Plan of Care (Signed)
Lab attempted to obtain specimen x3 including for first repeat trop, I attempted to draw via left EJ however was limited by movement as pt startled awake during each attempt. Order placed for a midline.

## 2019-02-13 ENCOUNTER — Inpatient Hospital Stay (HOSPITAL_COMMUNITY): Payer: Medicare Other

## 2019-02-13 ENCOUNTER — Other Ambulatory Visit: Payer: Self-pay

## 2019-02-13 LAB — COMPREHENSIVE METABOLIC PANEL
ALT: 30 U/L (ref 0–44)
AST: 30 U/L (ref 15–41)
Albumin: 2.9 g/dL — ABNORMAL LOW (ref 3.5–5.0)
Alkaline Phosphatase: 50 U/L (ref 38–126)
Anion gap: 9 (ref 5–15)
BUN: 15 mg/dL (ref 8–23)
CO2: 30 mmol/L (ref 22–32)
Calcium: 8.1 mg/dL — ABNORMAL LOW (ref 8.9–10.3)
Chloride: 102 mmol/L (ref 98–111)
Creatinine, Ser: 0.96 mg/dL (ref 0.44–1.00)
GFR calc Af Amer: >60 mL/min (ref >60)
GFR calc non Af Amer: 60 mL/min — ABNORMAL LOW (ref >60)
Glucose, Bld: 100 mg/dL — ABNORMAL HIGH (ref 70–99)
Potassium: 4 mmol/L (ref 3.5–5.1)
Sodium: 141 mmol/L (ref 135–145)
Total Bilirubin: 0.4 mg/dL (ref 0.3–1.2)
Total Protein: 5.9 g/dL — ABNORMAL LOW (ref 6.5–8.1)

## 2019-02-13 LAB — CBC
HCT: 46.7 % — ABNORMAL HIGH (ref 36.0–46.0)
Hemoglobin: 12.8 g/dL (ref 12.0–15.0)
MCH: 22.7 pg — ABNORMAL LOW (ref 26.0–34.0)
MCHC: 27.4 g/dL — ABNORMAL LOW (ref 30.0–36.0)
MCV: 82.8 fL (ref 80.0–100.0)
Platelets: 165 10*3/uL (ref 150–400)
RBC: 5.64 MIL/uL — ABNORMAL HIGH (ref 3.87–5.11)
RDW: 21.6 % — ABNORMAL HIGH (ref 11.5–15.5)
WBC: 7.7 10*3/uL (ref 4.0–10.5)
nRBC: 0 % (ref 0.0–0.2)

## 2019-02-13 LAB — GLUCOSE, CAPILLARY
Glucose-Capillary: 127 mg/dL — ABNORMAL HIGH (ref 70–99)
Glucose-Capillary: 125 mg/dL — ABNORMAL HIGH (ref 70–99)
Glucose-Capillary: 135 mg/dL — ABNORMAL HIGH (ref 70–99)
Glucose-Capillary: 113 mg/dL — ABNORMAL HIGH (ref 70–99)

## 2019-02-13 LAB — BRAIN NATRIURETIC PEPTIDE: B Natriuretic Peptide: 165 pg/mL — ABNORMAL HIGH (ref 0.0–100.0)

## 2019-02-13 LAB — TSH: TSH: 1.095 u[IU]/mL (ref 0.350–4.500)

## 2019-02-13 LAB — T4, FREE: Free T4: 0.58 ng/dL — ABNORMAL LOW (ref 0.61–1.12)

## 2019-02-13 LAB — T-HELPER CELLS (CD4) COUNT (NOT AT ARMC)
CD4 % Helper T Cell: 28 % — ABNORMAL LOW (ref 33–65)
CD4 T Cell Abs: 406 /uL (ref 400–1790)

## 2019-02-13 LAB — HEMOGLOBIN A1C
Hgb A1c MFr Bld: 6.7 % — ABNORMAL HIGH (ref 4.8–5.6)
Mean Plasma Glucose: 145.59 mg/dL

## 2019-02-13 LAB — AMMONIA: Ammonia: 40 umol/L — ABNORMAL HIGH (ref 9–35)

## 2019-02-13 LAB — TROPONIN I (HIGH SENSITIVITY): Troponin I (High Sensitivity): 94 ng/L — ABNORMAL HIGH (ref <18)

## 2019-02-13 LAB — LACTATE DEHYDROGENASE: LDH: 250 U/L — ABNORMAL HIGH (ref 98–192)

## 2019-02-13 LAB — MAGNESIUM: Magnesium: 1.8 mg/dL (ref 1.7–2.4)

## 2019-02-13 LAB — PHOSPHORUS: Phosphorus: 3.1 mg/dL (ref 2.5–4.6)

## 2019-02-13 MED ORDER — METHYLPREDNISOLONE SODIUM SUCC 125 MG IJ SOLR
60.0000 mg | Freq: Two times a day (BID) | INTRAMUSCULAR | Status: DC
  Administered 2019-02-13 – 2019-02-16 (×6): 60 mg via INTRAVENOUS
  Filled 2019-02-13 (×7): qty 2

## 2019-02-13 MED ORDER — ALBUTEROL SULFATE (2.5 MG/3ML) 0.083% IN NEBU: 2.5000 mg | INHALATION_SOLUTION | RESPIRATORY_TRACT | Status: DC | PRN

## 2019-02-13 MED ORDER — FUROSEMIDE 10 MG/ML IJ SOLN
40.0000 mg | Freq: Once | INTRAMUSCULAR | Status: AC
  Administered 2019-02-13: 40 mg via INTRAVENOUS
  Filled 2019-02-13: qty 4

## 2019-02-13 MED ORDER — INSULIN ASPART 100 UNIT/ML ~~LOC~~ SOLN
0.0000 [IU] | SUBCUTANEOUS | Status: DC
  Administered 2019-02-13 – 2019-02-16 (×6): 2 [IU] via SUBCUTANEOUS
  Administered 2019-02-16: 3 [IU] via SUBCUTANEOUS
  Administered 2019-02-16 – 2019-02-17 (×2): 2 [IU] via SUBCUTANEOUS

## 2019-02-13 MED ORDER — VENLAFAXINE HCL ER 75 MG PO CP24
150.0000 mg | ORAL_CAPSULE | Freq: Every day | ORAL | Status: DC
  Administered 2019-02-13 – 2019-02-17 (×5): 150 mg via ORAL
  Filled 2019-02-13: qty 1
  Filled 2019-02-13: qty 2
  Filled 2019-02-13: qty 1
  Filled 2019-02-13: qty 2
  Filled 2019-02-13: qty 1

## 2019-02-13 MED ORDER — GABAPENTIN 300 MG PO CAPS
300.0000 mg | ORAL_CAPSULE | Freq: Two times a day (BID) | ORAL | Status: DC
  Administered 2019-02-13 – 2019-02-17 (×8): 300 mg via ORAL
  Filled 2019-02-13 (×8): qty 1

## 2019-02-13 NOTE — Evaluation (Signed)
Physical Therapy Evaluation Patient Details Name: [name] MRN: [id number] DOB: [birth year] Today's Date: 02/13/2019   History of Present Illness  67yF with history of COPD, HIV, NSCLC s/p XRT admitted with likely multifactorial acute on chronic hypercapnic and hypoxic respiratory failure  Clinical Impression  Patient presents with decreased mobility due to deficits listed in PT problem list.  She will benefit from skilled PT in the acute setting to allow return home with follow up HHPT and aide services.      Follow Up Recommendations Home health PT(HH aide)    Equipment Recommendations  None recommended by PT    Recommendations for Other Services       Precautions / Restrictions Precautions Precautions: Fall Precaution Comments: watch O2      Mobility  Bed Mobility               General bed mobility comments: up in chair  Transfers Overall transfer level: Needs assistance Equipment used: Rolling walker (2 wheeled) Transfers: Stand Pivot Transfers;Sit to/from Stand Sit to Stand: Supervision Stand pivot transfers: Supervision          Ambulation/Gait Ambulation/Gait assistance: Supervision;Min guard Gait Distance (Feet): 200 Feet Assistive device: Rolling walker (2 wheeled) Gait Pattern/deviations: Step-through pattern;Step-to pattern;Trunk flexed     General Gait Details: steady, but started to complain of back pain about halfway throught, reports not taking all her normal medications  Stairs            Wheelchair Mobility    Modified Rankin (Stroke Patients Only)       Balance Overall balance assessment: Needs assistance   Sitting balance-Leahy Scale: Good       Standing balance-Leahy Scale: Fair Standing balance comment: toileting on BSC and transferred back to recliner unaided, no device                             Pertinent Vitals/Pain Pain Assessment: Faces Faces Pain Scale: Hurts even more Pain Location:  chronic LBP and LE muscle spasms Pain Descriptors / Indicators: Aching;Sharp Pain Intervention(s): Monitored during session;Repositioned    Home Living Family/patient expects to be discharged to:: Private residence Living Arrangements: Alone   Type of Home: House Home Access: Stairs to enter   Technical brewer of Steps: 1 Home Layout: One level Home Equipment: Environmental consultant - 2 wheels;Tub bench;Hand held shower head      Prior Function Level of Independence: Independent with assistive device(s)         Comments: used walker when she would go out; struggling wtih IADL's (cooking/cleaning);  reports she is legally blind     Hand Dominance        Extremity/Trunk Assessment   Upper Extremity Assessment Upper Extremity Assessment: Overall WFL for tasks assessed    Lower Extremity Assessment Lower Extremity Assessment: Overall WFL for tasks assessed       Communication   Communication: No difficulties  Cognition Arousal/Alertness: Awake/alert Behavior During Therapy: WFL for tasks assessed/performed Overall Cognitive Status: Within Functional Limits for tasks assessed                                        General Comments General comments (skin integrity, edema, etc.): Ambulated on 6L O2, SpO2 with ambulation 88% back to 92% <1 minute rest    Exercises     Assessment/Plan    PT  Assessment Patient needs continued PT services  PT Problem List Decreased activity tolerance;Decreased mobility;Decreased knowledge of use of DME;Cardiopulmonary status limiting activity;Decreased balance       PT Treatment Interventions DME instruction;Therapeutic activities;Balance training;Patient/family education;Therapeutic exercise;Functional mobility training;Gait training    PT Goals (Current goals can be found in the Care Plan section)  Acute Rehab PT Goals Patient Stated Goal: to return home PT Goal Formulation: With patient Time For Goal Achievement:  02/27/19 Potential to Achieve Goals: Good    Frequency Min 3X/week   Barriers to discharge Decreased caregiver support      Co-evaluation               AM-PAC PT "6 Clicks" Mobility  Outcome Measure Help needed turning from your back to your side while in a flat bed without using bedrails?: A Little Help needed moving from lying on your back to sitting on the side of a flat bed without using bedrails?: A Little Help needed moving to and from a bed to a chair (including a wheelchair)?: A Little Help needed standing up from a chair using your arms (e.g., wheelchair or bedside chair)?: A Little Help needed to walk in hospital room?: A Little Help needed climbing 3-5 steps with a railing? : A Little 6 Click Score: 18    End of Session Equipment Utilized During Treatment: Oxygen Activity Tolerance: Patient tolerated treatment well Patient left: in chair;with call bell/phone within reach   PT Visit Diagnosis: Other abnormalities of gait and mobility (R26.89)    Time: 4034-7425 PT Time Calculation (min) (ACUTE ONLY): 25 min   Charges:   PT Evaluation $PT Eval Moderate Complexity: 1 Mod PT Treatments $Gait Training: 8-22 mins        Magda Kiel, [place] [phone] 02/13/2019   Reginia Naas 02/13/2019, 5:14 PM

## 2019-02-13 NOTE — Progress Notes (Addendum)
NAME:  [name], MRN:  [id number], DOB:  1946-05-23, LOS: 2 ADMISSION DATE:  02/11/2019, CONSULTATION DATE:  02/11/19 REFERRING MD:  Darl Householder, CHIEF COMPLAINT:  Dyspnea  Brief History   73yF with history of COPD, HIV, NSCLC s/p XRT admitted with likely multifactorial acute on chronic hypercapnic and hypoxic respiratory failure    History of present illness   [name] is a 73 y.o. female hx of COPD, HIV, nonsmall cell lung cancer s/p radiation, here presenting with shortness of breath, epigastric pain.  Much of the history is gathered by chart review as she is unable to attend to conversation at the time of the interview. She apparently had epigastric pain and dyspnea for a few days preceding her admission.   Sister knows that recently she's had trouble breathing according to conversation with her neighbor but has not spoken with her since Sunday. She says that she lives by herself and that she is normally independent in all her activities at home, attends well to conversation.  Past Medical History  HIV Stage IA unresectable lung cancer s/p XRT 2015 COPD FEV1 51%  Significant Hospital Events   In ED received fentanyl, albuterol, 125 solumedrol and lasix 40 IV 7/16 BiPAP weaned to supplemental [place].   Consults:  None  Procedures:  None  Significant Diagnostic Tests:  CXR reviewed 8/16>> Heart size is normal. No pleural effusion/ edema. Stable chronic scarring within the left midlung. No superimposed airspace consolidation.  ECHO 8/16> LVEF 60-65% Mild concentric LVH. RV moderately reduced systolic function. Mild increase in RV wall thickness. RA mild dilation. Trivial pericardial effusion. Degenerative appearing mitral valve. Moderate tricuspid regurg. Mild thickening and calcification of aortic valve, which is tricuspid. Dilated IVC.   CXR 8/17> new LLL opacity, possible effusion   Micro Data:  covid negative, no prior resistant organisms, RVP negative   8/17 PCP  sputum>>>  Antimicrobials:  Azithromycin  Interim history/subjective:  Removed from BiPAP and placed on 4LNC, tolerating well   Objective   Blood pressure 131/81, pulse 76, temperature (!) 96.1 F (35.6 C), temperature source Axillary, resp. rate 20, height 5\' 9"  (1.753 m), weight 98.5 kg, SpO2 (!) 82 %.    FiO2 (%):  [40 %] 40 %   Intake/Output Summary (Last 24 hours) at 02/13/2019 0827 Last data filed at 02/13/2019 0700 Gross per 24 hour  Intake 1450.36 ml  Output 1300 ml  Net 150.36 ml   Filed Weights   02/11/19 1738 02/12/19 0453 02/13/19 0500  Weight: 102 kg 98.5 kg 98.5 kg    Examination: General: Chronically ill appearing older adult F, seated in chair NAD  HENT: NCAT. Pink tacky mm. Trachea midline, Anicteric sclera. Patent nares with 4LNC in place  Lungs: 93% on 4LNC. Bilateral chest excursion, no accessory muscle recruitment. LUL inspiratory wheeze. Diminished bibasilar breath sounds  Cardiovascular: RRR s1s2 no rgm Cap recill < 3 sec BUE BLE  Abdomen: Soft, round, ndnt, normoactive x4  Extremities: Symmetrical bulk and tone, no cyanosis or clubbing. No edema  Neuro: AAOx3, following commands, PERRL Psych: flat affect  Resolved Hospital Problem list   N/A  Assessment & Plan:   Acute on chronic hypercapnic and hypoxic respiratory failure requiring NPPV -likely multifactorial etiology, with suspected component of decompensated CHF given elevated BNP and trops, possible AECOPD, and initially opioid related hypoventilation.  -Hx HIV with risk for PJP PNA. Does have elevated LDH but acuity of respiratory symptoms and improvement with NPPV, steroids, diuresis suggests other causes  -  Suspect possible component of undiagnosed OSA/OSH  -CXR 8/17 with new LLL density-- pleural process vs ASD  P - Follow up PJP  -Continue BID brovana, QID duoneb, PRN albuterol - solumedrol 60 q12hrs (decreased from q6hr to q12 hr 8/17 due to clinical improvement)  - Continue azithro -  Nocturnal BiPAP   - Supplemental O2 for SpO2 88-92% when not on BiPAP  - Lasix 40mg  (8/17) - Continue to trend CXR, LDH  - Will need outpatient follow up for possible OSA/OSH   Acute encephalopathy -mild hyperammonemia 8/17 with ammonia 40  -Suspect initial component of hypercarbia and hypoxia contributing to mental status as patient has improved following BiPAP 8/16 -Do also feel that polypharmacy may be contributing given patient's home medications P -Trend AM ammonia, if worsening mental status or NH3 would favor starting lactulose -minimize sedating agents  Mild troponinemia   - suspect demand in setting of acute hypoxia -Do not have clinical suspicion of ACS  -ECHO 8/16: LVEF 60-65% Mild concentric LVH. RV moderately reduced systolic function. Mild increase in RV wall thickness. RA mild dilation.  P - Continue ASA - Troponins are downtrending, can stop trending  HIV:  - check CD4 - continue home Atripla  Polypharmacy Home Robaxin, Gabapentin, Seroquel all of which could be suppressing respiration when asleep P -Holding at present given respiratory concerns -Patient will need follow up with OP provider regarding these medications  Tobacco abuse disorder P -Tobacco cessation counseling -Pulmonary Follow up with PFT's  Hyperglycemia, at risk -Steroids P -SSI   Best practice:  Diet: Advance as tolerated to carb controlled  Pain/Anxiety/Delirium protocol (if indicated): n/a VAP protocol (if indicated): n/a DVT prophylaxis: lovenox  GI prophylaxis: not indicated Glucose control: SSI Mobility: PT  Code Status: Full Family Communication: Patient updated. Of note, Sister Myrtha Mantis both willing to be involved in medical decision-making in event of decompensation Disposition: anticipate transfer out of ICU likely today (8/17)   Labs   CBC: Recent Labs  Lab 02/11/19 1924 02/11/19 2214 02/12/19 0224 02/12/19 0435 02/13/19 0413  WBC 8.7  --   --  6.3 7.7   NEUTROABS 6.3  --   --   --   --   HGB 14.4 18.4* 17.7* 13.8 12.8  HCT 54.0* 54.0* 52.0* 51.1* 46.7*  MCV 85.2  --   --  84.6 82.8  PLT 181  --   --  157 161    Basic Metabolic Panel: Recent Labs  Lab 02/11/19 1919 02/11/19 1924 02/11/19 2214 02/12/19 0224 02/12/19 0435 02/13/19 0413  NA 142  142 139 140 141  --  141  K 3.7  3.8 4.0 3.8 3.7  --  4.0  CL 98 98  --   --   --  102  CO2  --  31  --   --   --  30  GLUCOSE 113* 122*  --   --   --  100*  BUN 14 12  --   --   --  15  CREATININE 0.90 0.94  --   --  1.12* 0.96  CALCIUM  --  8.6*  --   --   --  8.1*  MG  --   --   --   --   --  1.8  PHOS  --   --   --   --   --  3.1   GFR: Estimated Creatinine Clearance: 67.1 mL/min (by C-G formula based on SCr of 0.96 mg/dL).  Recent Labs  Lab 02/11/19 1924 02/12/19 0435 02/13/19 0413  WBC 8.7 6.3 7.7    Liver Function Tests: Recent Labs  Lab 02/11/19 1924 02/13/19 0413  AST 64* 30  ALT 39 30  ALKPHOS 68 50  BILITOT 1.1 0.4  PROT 7.2 5.9*  ALBUMIN 3.2* 2.9*   Recent Labs  Lab 02/11/19 1924  LIPASE 34   Recent Labs  Lab 02/13/19 0553  AMMONIA 40*    ABG    Component Value Date/Time   PHART 7.325 (L) 02/12/2019 0224   PCO2ART 63.7 (H) 02/12/2019 0224   PO2ART 71.0 (L) 02/12/2019 0224   HCO3 33.4 (H) 02/12/2019 0224   TCO2 35 (H) 02/12/2019 0224   O2SAT 92.0 02/12/2019 0224     Coagulation Profile: No results for input(s): INR, PROTIME in the last 168 hours.  Cardiac Enzymes: No results for input(s): CKTOTAL, CKMB, CKMBINDEX, TROPONINI in the last 168 hours.  HbA1C: No results found for: HGBA1C  CBG: No results for input(s): GLUCAP in the last 168 hours.    Eliseo Gum MSN, AGACNP-BC [place] [phone] If no answer, [phone] 02/13/2019, 8:27 AM

## 2019-02-14 ENCOUNTER — Inpatient Hospital Stay (HOSPITAL_COMMUNITY): Payer: Medicare Other

## 2019-02-14 LAB — COMPREHENSIVE METABOLIC PANEL
ALT: 28 U/L (ref 0–44)
AST: 37 U/L (ref 15–41)
Albumin: 3.1 g/dL — ABNORMAL LOW (ref 3.5–5.0)
Alkaline Phosphatase: 51 U/L (ref 38–126)
Anion gap: 8 (ref 5–15)
BUN: 20 mg/dL (ref 8–23)
CO2: 31 mmol/L (ref 22–32)
Calcium: 8.5 mg/dL — ABNORMAL LOW (ref 8.9–10.3)
Chloride: 101 mmol/L (ref 98–111)
Creatinine, Ser: 0.97 mg/dL (ref 0.44–1.00)
GFR calc Af Amer: >60 mL/min (ref >60)
GFR calc non Af Amer: 59 mL/min — ABNORMAL LOW (ref >60)
Glucose, Bld: 91 mg/dL (ref 70–99)
Potassium: 4.2 mmol/L (ref 3.5–5.1)
Sodium: 140 mmol/L (ref 135–145)
Total Bilirubin: 0.7 mg/dL (ref 0.3–1.2)
Total Protein: 6.5 g/dL (ref 6.5–8.1)

## 2019-02-14 LAB — GLUCOSE, CAPILLARY
Glucose-Capillary: 101 mg/dL — ABNORMAL HIGH (ref 70–99)
Glucose-Capillary: 108 mg/dL — ABNORMAL HIGH (ref 70–99)
Glucose-Capillary: 117 mg/dL — ABNORMAL HIGH (ref 70–99)
Glucose-Capillary: 91 mg/dL (ref 70–99)
Glucose-Capillary: 96 mg/dL (ref 70–99)
Glucose-Capillary: 98 mg/dL (ref 70–99)
Glucose-Capillary: 99 mg/dL (ref 70–99)

## 2019-02-14 LAB — CBC
HCT: 48.9 % — ABNORMAL HIGH (ref 36.0–46.0)
Hemoglobin: 13.3 g/dL (ref 12.0–15.0)
MCH: 22.9 pg — ABNORMAL LOW (ref 26.0–34.0)
MCHC: 27.2 g/dL — ABNORMAL LOW (ref 30.0–36.0)
MCV: 84.2 fL (ref 80.0–100.0)
Platelets: 156 10*3/uL (ref 150–400)
RBC: 5.81 MIL/uL — ABNORMAL HIGH (ref 3.87–5.11)
RDW: 21.6 % — ABNORMAL HIGH (ref 11.5–15.5)
WBC: 8.7 10*3/uL (ref 4.0–10.5)
nRBC: 0 % (ref 0.0–0.2)

## 2019-02-14 LAB — LACTATE DEHYDROGENASE: LDH: 327 U/L — ABNORMAL HIGH (ref 98–192)

## 2019-02-14 LAB — PNEUMOCYSTIS JIROVECI SMEAR BY DFA: Pneumocystis jiroveci Ag: NEGATIVE

## 2019-02-14 LAB — AMMONIA: Ammonia: 45 umol/L — ABNORMAL HIGH (ref 9–35)

## 2019-02-14 MED ORDER — GUAIFENESIN-DM 100-10 MG/5ML PO SYRP
10.0000 mL | ORAL_SOLUTION | Freq: Four times a day (QID) | ORAL | Status: DC
  Administered 2019-02-14 – 2019-02-17 (×10): 10 mL via ORAL
  Filled 2019-02-14 (×10): qty 10

## 2019-02-14 MED ORDER — TIZANIDINE HCL 4 MG PO TABS
2.0000 mg | ORAL_TABLET | Freq: Once | ORAL | Status: AC
  Administered 2019-02-14: 2 mg via ORAL
  Filled 2019-02-14: qty 1

## 2019-02-14 MED ORDER — ORAL CARE MOUTH RINSE
15.0000 mL | Freq: Two times a day (BID) | OROMUCOSAL | Status: DC
  Administered 2019-02-14 – 2019-02-17 (×3): 15 mL via OROMUCOSAL

## 2019-02-14 NOTE — Progress Notes (Signed)
PROGRESS NOTE    Patient: [name]                            PCP: System, Provider Not In                    DOB: [birth year]            DOA: 02/11/2019 VXB:[id number]             DOS: 02/14/2019, 11:16 AM   LOS: 3 days   Date of Service: The patient was seen and examined on 02/14/2019  Subjective:   ICU transfer The patient was seen and examined this morning, stable no acute distress, on 5 L of oxygen via nasal cannula, satting 92%. Was on BiPAP overnight.  Brief Narrative:   [name] a 73 y.o.femalehx of COPD, HIV, nonsmall cell lung cancer s/p radiation, Presented with shortness of breath, epigastric pain.  She apparently had epigastric pain and dyspnea for a few days preceding her admission.   Sister knows that recently she's had trouble breathing according to conversation with her neighbor but has not spoken with her since Sunday. She says that she lives by herself and that she is normally independent in all her activities at home, attends well to conversation. Patient was subsequently admitted to ICU for COPD exacerbation, acute on chronic respiratory failure. Patient improved on BiPAP did not require intubation.    Assessment & Plan:   Active Problems:   Acute and chronic respiratory failure (acute-on-chronic) (HCC)   Acute on chronic hypercapnic and hypoxic respiratory failure requiring NPPV -Patient was initially admitted to ICU, continued on BiPAP-as blood gases improved she did not require intubation. -Respiratory failure thought to be multifactorial including OSA/OSH, obesity hypoventilation syndrome decompensated CHF, given elevated BNP, troponin, opioid related hypo-ventilation, pneumonia.  -Hx HIV with risk for PJP PNA. elevated LDH but acuity of respiratory symptoms and improvement with NPPV, steroids, diuresis  -CXR 8/17 with new LLL density-- pleural process vs ASD   -We will continue the patient on O2 via nasal cannula, currently on 5 L,  satting 87-96%, currently 92%, (goal 88-92%) -Continue PRN DuoNeb bronchodilators, inhalers -Continue BID brovana, QID duoneb, PRN albuterol - solumedrol 60 q12hrs (decreased from q6hr to q12 hr 8/17 due to clinical improvement)  - Continue azithro - Nocturnal BiPAP   - Supplemental O2 for SpO2 88-92% when not on BiPAP  - Lasix 97m (8/17) - Continue to trend CXR, LDH  - Will need outpatient follow up for possible OSA/OSH  -Patient will be evaluated for O2 home use, appreciate pulmonary and social work setting home CPAP versus BiPAP  Acute encephalopathy -Likely due to hypoxia, improved, also mildly elevated ammonia level  -Trend AM ammonia, if worsening mental status or NH3 would favor starting lactulose -minimize sedating agents  Mild troponinemia   - suspect demand in setting of acute hypoxia -Do not have clinical suspicion of ACS  -ECHO 8/16: LVEF 60-65% Mild concentric LVH. RV moderately reduced systolic function. Mild increase in RV wall thickness. RA mild dilation.  P - Continue ASA - Troponins are downtrending, can stop trending  HIV:  - checking  CD4 - continue home Atripla  Polypharmacy Home Robaxin, Gabapentin, Seroquel all of which could be suppressing respiration when asleep  -Holding at present given respiratory concerns -Patient will need follow up with OP provider regarding these medications  Tobacco abuse disorder  -Tobacco cessation counseling -Pulmonary  Follow up with PFT's  Hyperglycemia, at risk -Steroids -SSI    Cultures;  Viral respiratory panel negative,  SARS-CoV-2 negative No blood or sputum  cultures were obtained on admission or in ICU  Antimicrobials: Azithromycin  DVT prophylaxis: SCD/Compression stockings and Lovenox SQ Code Status:   Code Status: Full Code Family Communication: No family member present at bedside- attempt will be made to update daily The above findings and plan of care has been discussed with patient and  family in detail,  they expressed understanding and agreement of above. Disposition Plan:   Anticipated 1-2 days Admission status:  INPATIEN -   Consultants: PCCM    CXR reviewed 8/16>> Heart size is normal. No pleural effusion/ edema. Stable chronic scarring within the left midlung. No superimposed airspace consolidation.  ECHO 8/16> LVEF 60-65% Mild concentric LVH. RV moderately reduced systolic function. Mild increase in RV wall thickness. RA mild dilation. Trivial pericardial effusion. Degenerative appearing mitral valve. Moderate tricuspid regurg. Mild thickening and calcification of aortic valve, which is tricuspid. Dilated IVC.   CXR 8/17> new LLL opacity, possible effusion     Procedures:   No admission procedures for hospital encounter.     Antimicrobials:  Anti-infectives (From admission, onward)   Start     Dose/Rate Route Frequency Ordered Stop   02/11/19 2330  efavirenz-emtricitabine-tenofovir (ATRIPLA) 600-200-300 MG per tablet 1 tablet     1 tablet Oral Daily at bedtime 02/11/19 2211     02/11/19 2230  azithromycin (ZITHROMAX) 500 mg in sodium chloride 0.9 % 250 mL IVPB     500 mg 250 mL/hr over 60 Minutes Intravenous Every 24 hours 02/11/19 2227         Medication:  . arformoterol  15 mcg Nebulization BID  . aspirin  325 mg Oral Daily  . chlorhexidine gluconate (MEDLINE KIT)  15 mL Mouth Rinse BID  . Chlorhexidine Gluconate Cloth  6 each Topical Daily  . efavirenz-emtricitabine-tenofovir  1 tablet Oral QHS  . enoxaparin (LOVENOX) injection  40 mg Subcutaneous Daily  . gabapentin  300 mg Oral BID  . insulin aspart  0-15 Units Subcutaneous Q4H  . ipratropium-albuterol  3 mL Nebulization QID  . mouth rinse  15 mL Mouth Rinse 10 times per day  . methylPREDNISolone (SOLU-MEDROL) injection  60 mg Intravenous BID  . venlafaxine XR  150 mg Oral Q breakfast    acetaminophen, albuterol, ondansetron (ZOFRAN) IV   Objective:   Vitals:   02/14/19 0905  02/14/19 0906 02/14/19 1000 02/14/19 1100  BP:   117/61 (!) 133/91  Pulse:      Resp:   17 16  Temp:      TempSrc:      SpO2: 95% 96% (!) 87% 92%  Weight:      Height:        Intake/Output Summary (Last 24 hours) at 02/14/2019 1116 Last data filed at 02/14/2019 1026 Gross per 24 hour  Intake 654.64 ml  Output 1275 ml  Net -620.36 ml   Filed Weights   02/12/19 0453 02/13/19 0500 02/14/19 0453  Weight: 98.5 kg 98.5 kg 99.5 kg     Examination:   Physical Exam  Constitution: Mild-moderate shortness of breath, alert, cooperative,   Appears calm and comfortable  Psychiatric: Normal and stable mood and affect, cognition intact,   HEENT: Normocephalic, PERRL, otherwise with in Normal limits  Chest:Chest symmetric Cardio vascular:  S1/S2, RRR, No murmure, No Rubs or Gallops  pulmonary: Clear to auscultation bilaterally, respirations unlabored,  negative wheezes / crackles Abdomen: Soft, non-tender, non-distended, bowel sounds,no masses, no organomegaly Muscular skeletal: Limited exam - in bed, able to move all 4 extremities, Normal strength,  Neuro: CNII-XII intact. , normal motor and sensation, reflexes intact  Extremities: No pitting edema lower extremities, +2 pulses  Skin: Dry, warm to touch, negative for any Rashes, No open wounds Wounds: per nursing documentation  LABs:  CBC Latest Ref Rng & Units 02/14/2019 02/13/2019 02/12/2019  WBC 4.0 - 10.5 K/uL 8.7 7.7 6.3  Hemoglobin 12.0 - 15.0 g/dL 13.3 12.8 13.8  Hematocrit 36.0 - 46.0 % 48.9(H) 46.7(H) 51.1(H)  Platelets 150 - 400 K/uL 156 165 157   CMP Latest Ref Rng & Units 02/14/2019 02/13/2019 02/12/2019  Glucose 70 - 99 mg/dL 91 100(H) -  BUN 8 - 23 mg/dL 20 15 -  Creatinine 0.44 - 1.00 mg/dL 0.97 0.96 1.12(H)  Sodium 135 - 145 mmol/L 140 141 141  Potassium 3.5 - 5.1 mmol/L 4.2 4.0 3.7  Chloride 98 - 111 mmol/L 101 102 -  CO2 22 - 32 mmol/L 31 30 -  Calcium 8.9 - 10.3 mg/dL 8.5(L) 8.1(L) -  Total Protein 6.5 - 8.1 g/dL 6.5  5.9(L) -  Total Bilirubin 0.3 - 1.2 mg/dL 0.7 0.4 -  Alkaline Phos 38 - 126 U/L 51 50 -  AST 15 - 41 U/L 37 30 -  ALT 0 - 44 U/L 28 30 -     SIGNED: Deatra [name], MD, FACP, FHM. [facility],  Pager [phone]  If 7PM-7AM, please contact night-coverage [email or web address] Ota [facility] 02/14/2019, 11:16 AM

## 2019-02-15 LAB — CBC
HCT: 50.4 % — ABNORMAL HIGH (ref 36.0–46.0)
Hemoglobin: 13.5 g/dL (ref 12.0–15.0)
MCH: 22.5 pg — ABNORMAL LOW (ref 26.0–34.0)
MCHC: 26.8 g/dL — ABNORMAL LOW (ref 30.0–36.0)
MCV: 84 fL (ref 80.0–100.0)
Platelets: 167 10*3/uL (ref 150–400)
RBC: 6 MIL/uL — ABNORMAL HIGH (ref 3.87–5.11)
RDW: 21.4 % — ABNORMAL HIGH (ref 11.5–15.5)
WBC: 7.1 10*3/uL (ref 4.0–10.5)
nRBC: 0 % (ref 0.0–0.2)

## 2019-02-15 LAB — GLUCOSE, CAPILLARY
Glucose-Capillary: 102 mg/dL — ABNORMAL HIGH (ref 70–99)
Glucose-Capillary: 109 mg/dL — ABNORMAL HIGH (ref 70–99)
Glucose-Capillary: 113 mg/dL — ABNORMAL HIGH (ref 70–99)
Glucose-Capillary: 124 mg/dL — ABNORMAL HIGH (ref 70–99)
Glucose-Capillary: 124 mg/dL — ABNORMAL HIGH (ref 70–99)
Glucose-Capillary: 126 mg/dL — ABNORMAL HIGH (ref 70–99)
Glucose-Capillary: 173 mg/dL — ABNORMAL HIGH (ref 70–99)
Glucose-Capillary: 99 mg/dL (ref 70–99)

## 2019-02-15 LAB — COMPREHENSIVE METABOLIC PANEL
ALT: 30 U/L (ref 0–44)
AST: 26 U/L (ref 15–41)
Albumin: 3.3 g/dL — ABNORMAL LOW (ref 3.5–5.0)
Alkaline Phosphatase: 51 U/L (ref 38–126)
Anion gap: 8 (ref 5–15)
BUN: 18 mg/dL (ref 8–23)
CO2: 32 mmol/L (ref 22–32)
Calcium: 8.8 mg/dL — ABNORMAL LOW (ref 8.9–10.3)
Chloride: 100 mmol/L (ref 98–111)
Creatinine, Ser: 0.97 mg/dL (ref 0.44–1.00)
GFR calc Af Amer: >60 mL/min (ref >60)
GFR calc non Af Amer: 59 mL/min — ABNORMAL LOW (ref >60)
Glucose, Bld: 119 mg/dL — ABNORMAL HIGH (ref 70–99)
Potassium: 4.5 mmol/L (ref 3.5–5.1)
Sodium: 140 mmol/L (ref 135–145)
Total Bilirubin: 0.7 mg/dL (ref 0.3–1.2)
Total Protein: 6.9 g/dL (ref 6.5–8.1)

## 2019-02-15 NOTE — Progress Notes (Signed)
SATURATION QUALIFICATIONS: (This note is used to comply with regulatory documentation for home oxygen)  Patient Saturations on Room Air at Rest = 84%  Patient Saturations on Room Air while Ambulating = NT due to hypoxic with O2  Patient Saturations on 3 Liters of oxygen while Ambulating = 84%  Please briefly explain why patient needs home oxygen:  Patient needing increased O2 for mobility up to 6L for SpO2 89% with ambulation.   Magda Kiel, [place] [phone] 02/15/2019

## 2019-02-15 NOTE — Progress Notes (Addendum)
[facility]  PROGRESS NOTE  QUANTINA DERSHEM WLN:[id number] DOB: Sep 22, 1945 DOA: 02/11/2019 PCP: System, Provider Not In  Brief History    [name] is a 73 y.o. year old female with medical history significant for COPD, HIV, nonsmall cell lung cancer s/p radiation who presented on 02/11/2019 with shortness of breath, epigastric pain and was found to have acute on chronic respiratory failure with improvement on BiPAP in the ICU.  A & P   Acute on chronic hypercapnic/hypoxic respiratory failure secondary to multifactorial etiology (OHS/OSA/COPD/CHF/sedatives), no longer requiring noninvasive positive pressure ventilation, but still requiring 5 L at rest and upwards of 6 L with ambulation (previous baseline 3 L).  Elevated CO2 on admission with worsening dyspnea concerning for COPD flare as well as CHF exacerbation given elevated BNP), also on sedating medications (opiates) that could contribute to hypoventilation and patient with obesity hypoventilation syndrome/OSA medications, continue IV Solu-Medrol given persistent high O2 requirements, optimize pulmonary toilet with incentive spirometry, flutter valve use, continue scheduled duonebs/inhalers, completed azithromycin for COPD flare.   Acute encephalopathy, related to hypoxia, resolved with improvement in respiratory status.  Alert oriented x4, mental status exam.   HIV.  PCP antigen negative continue home antiviral, CD4 406.   Elevated troponin, suspect leading to demand ischemia in setting of acute hypoxia, no symptoms consistent with ACS.  Troponin already downtrending.   Polypharmacy.  Suspect sedating medications likely contributed to hypoventilation, currently holding home Robaxin, gabapentin, Seroquel, can follow with outpatient provider) immune related to steroids.  Continue to closely monitor, sliding scale as needed.     DVT prophylaxis: Lovenox Code Status: Full code Family Communication: No family at bedside  Disposition Plan: Continue IV Solu-Medrol given persistent O2 requirements, hopeful to be able to wean O2 closer to home regimen in next 24 hours, continue to optimize pulmonary toilet    [facility] Direct contact: see [email or web address] (further directions at bottom of note if needed) 7PM-7AM contact night coverage as at bottom of note 02/15/2019, 8:25 AM  LOS: 4 days   Consultants  . PCCM  Procedures  . TTE, 02/12/2019   The left ventricle has normal systolic function with an ejection fraction of 60-65%. The cavity size was normal. There is mild concentric left ventricular hypertrophy. Left ventricular diastolic Doppler parameters are consistent with impaired  relaxation. Indeterminate filling pressures.  2. The right ventricle has moderately reduced systolic function. The cavity was mildly enlarged. There is mildly increased right ventricular wall thickness.  3. Right atrial size was mildly dilated.  4. Trivial pericardial effusion is present.  5. The aortic valve is tricuspid. Mild thickening of the aortic valve. Mild calcification of the aortic valve. Mild aortic annular calcification noted.  6. The mitral valve is degenerative. Mild thickening of the mitral valve leaflet. There is mild mitral annular calcification present.  7. The tricuspid valve is grossly normal. Tricuspid valve regurgitation is moderate.  8. The aorta is normal unless otherwise noted.  9. The inferior vena cava was dilated in size with >50% respiratory variability  Antibiotics  . Azithromycin  Interval History/Subjective  Feels breathing is somewhat better.  Walked with physical therapy today still requiring 6 L with ambulation  Objective   Vitals:  Vitals:   02/15/19 0500 02/15/19 0822  BP: (!) 145/87   Pulse:    Resp: (!) 26 15  Temp:    SpO2: 94% 99%    Exam:  Awake Alert, Oriented X 3, No new F.N deficits, Normal affect [place].AT,PERRAL Supple  Neck,No JVD appreciated,  Symmetrical Chest wall  movement, Good air movement bilaterally, scant wheezes at lower lobes, 5 L nasal cannula no respiratory distress RRR,No Gallops,Rubs or new Murmurs,  +ve B.Sounds, Abd Soft, No tenderness, No organomegaly appriciated, No rebound - guarding or rigidity. No Cyanosis, Clubbing or edema, No new Rash or bruise    I have personally reviewed the following:   Data Reviewed: Basic Metabolic Panel: Recent Labs  Lab 02/11/19 1919 02/11/19 1924 02/11/19 2214 02/12/19 0224 02/12/19 0435 02/13/19 0413 02/14/19 0355 02/15/19 0356  NA 142  142 139 140 141  --  141 140 140  K 3.7  3.8 4.0 3.8 3.7  --  4.0 4.2 4.5  CL 98 98  --   --   --  102 101 100  CO2  --  31  --   --   --  30 31 32  GLUCOSE 113* 122*  --   --   --  100* 91 119*  BUN 14 12  --   --   --  15 20 18   CREATININE 0.90 0.94  --   --  1.12* 0.96 0.97 0.97  CALCIUM  --  8.6*  --   --   --  8.1* 8.5* 8.8*  MG  --   --   --   --   --  1.8  --   --   PHOS  --   --   --   --   --  3.1  --   --    Liver Function Tests: Recent Labs  Lab 02/11/19 1924 02/13/19 0413 02/14/19 0355 02/15/19 0356  AST 64* 30 37 26  ALT 39 30 28 30   ALKPHOS 68 50 51 51  BILITOT 1.1 0.4 0.7 0.7  PROT 7.2 5.9* 6.5 6.9  ALBUMIN 3.2* 2.9* 3.1* 3.3*   Recent Labs  Lab 02/11/19 1924  LIPASE 34   Recent Labs  Lab 02/13/19 0553 02/14/19 0355  AMMONIA 40* 45*   CBC: Recent Labs  Lab 02/11/19 1924  02/12/19 0224 02/12/19 0435 02/13/19 0413 02/14/19 0355 02/15/19 0356  WBC 8.7  --   --  6.3 7.7 8.7 7.1  NEUTROABS 6.3  --   --   --   --   --   --   HGB 14.4   < > 17.7* 13.8 12.8 13.3 13.5  HCT 54.0*   < > 52.0* 51.1* 46.7* 48.9* 50.4*  MCV 85.2  --   --  84.6 82.8 84.2 84.0  PLT 181  --   --  157 165 156 167   < > = values in this interval not displayed.   Cardiac Enzymes: No results for input(s): CKTOTAL, CKMB, CKMBINDEX, TROPONINI in the last 168 hours. BNP (last 3 results) Recent Labs    02/11/19 1924 02/13/19 0413  BNP 533.2*  165.0*    ProBNP (last 3 results) No results for input(s): PROBNP in the last 8760 hours.  CBG: Recent Labs  Lab 02/14/19 2012 02/14/19 2336 02/15/19 0024 02/15/19 0326 02/15/19 0347  GLUCAP 96 101* 102* 124* 126*    Recent Results (from the past 240 hour(s))  SARS Coronavirus 2 [facility] order, Performed in [facility] hospital lab) Nasopharyngeal Nasopharyngeal Swab     Status: None   Collection Time: 02/11/19  6:27 PM   Specimen: Nasopharyngeal Swab  Result Value Ref Range Status   SARS Coronavirus 2 NEGATIVE NEGATIVE Final    Comment: (NOTE) If result is  NEGATIVE SARS-CoV-2 target nucleic acids are NOT DETECTED. The SARS-CoV-2 RNA is generally detectable in upper and lower  respiratory specimens during the acute phase of infection. The lowest  concentration of SARS-CoV-2 viral copies this assay can detect is 250  copies / mL. A negative result does not preclude SARS-CoV-2 infection  and should not be used as the sole basis for treatment or other  patient management decisions.  A negative result may occur with  improper specimen collection / handling, submission of specimen other  than nasopharyngeal swab, presence of viral mutation(s) within the  areas targeted by this assay, and inadequate number of viral copies  (<250 copies / mL). A negative result must be combined with clinical  observations, patient history, and epidemiological information. If result is POSITIVE SARS-CoV-2 target nucleic acids are DETECTED. The SARS-CoV-2 RNA is generally detectable in upper and lower  respiratory specimens dur ing the acute phase of infection.  Positive  results are indicative of active infection with SARS-CoV-2.  Clinical  correlation with patient history and other diagnostic information is  necessary to determine patient infection status.  Positive results do  not rule out bacterial infection or co-infection with other viruses. If result is PRESUMPTIVE POSTIVE SARS-CoV-2  nucleic acids MAY BE PRESENT.   A presumptive positive result was obtained on the submitted specimen  and confirmed on repeat testing.  While 2019 novel coronavirus  (SARS-CoV-2) nucleic acids may be present in the submitted sample  additional confirmatory testing may be necessary for epidemiological  and / or clinical management purposes  to differentiate between  SARS-CoV-2 and other Sarbecovirus currently known to infect humans.  If clinically indicated additional testing with an alternate test  methodology [phone]) is advised. The SARS-CoV-2 RNA is generally  detectable in upper and lower respiratory sp ecimens during the acute  phase of infection. The expected result is Negative. Fact Sheet for Patients:  StrictlyIdeas.no Fact Sheet for Healthcare Providers: BankingDealers.co.za This test is not yet approved or cleared by the [place] FDA and has been authorized for detection and/or diagnosis of SARS-CoV-2 by FDA under an Emergency Use Authorization (EUA).  This EUA will remain in effect (meaning this test can be used) for the duration of the COVID-19 declaration under Section 564(b)(1) of the Act, 21 U.S.C. section 360bbb-3(b)(1), unless the authorization is terminated or revoked sooner. Performed at [facility], [place] [address] [place], [place] [address]   MRSA PCR Screening     Status: None   Collection Time: 02/11/19 11:09 PM   Specimen: Nasopharyngeal  Result Value Ref Range Status   MRSA by PCR NEGATIVE NEGATIVE Final    Comment:        The GeneXpert MRSA Assay (FDA approved for NASAL specimens only), is one component of a comprehensive MRSA colonization surveillance program. It is not intended to diagnose MRSA infection nor to guide or monitor treatment for MRSA infections. Performed at [facility], [place] [address], [place], [place] [address]   Respiratory Panel by PCR     Status: None    Collection Time: 02/12/19  1:36 AM  Result Value Ref Range Status   Adenovirus NOT DETECTED NOT DETECTED Final   Coronavirus 229E NOT DETECTED NOT DETECTED Final    Comment: (NOTE) The Coronavirus on the Respiratory Panel, DOES NOT test for the novel  Coronavirus (2019 nCoV)    Coronavirus HKU1 NOT DETECTED NOT DETECTED Final   Coronavirus NL63 NOT DETECTED NOT DETECTED Final   Coronavirus  OC43 NOT DETECTED NOT DETECTED Final   Metapneumovirus NOT DETECTED NOT DETECTED Final   Rhinovirus / Enterovirus NOT DETECTED NOT DETECTED Final   Influenza A NOT DETECTED NOT DETECTED Final   Influenza B NOT DETECTED NOT DETECTED Final   Parainfluenza Virus 1 NOT DETECTED NOT DETECTED Final   Parainfluenza Virus 2 NOT DETECTED NOT DETECTED Final   Parainfluenza Virus 3 NOT DETECTED NOT DETECTED Final   Parainfluenza Virus 4 NOT DETECTED NOT DETECTED Final   Respiratory Syncytial Virus NOT DETECTED NOT DETECTED Final   Bordetella pertussis NOT DETECTED NOT DETECTED Final   Chlamydophila pneumoniae NOT DETECTED NOT DETECTED Final   Mycoplasma pneumoniae NOT DETECTED NOT DETECTED Final    Comment: Performed at [facility], [place] [address], [place], [place] [address]  Pneumocystis smear by DFA     Status: None   Collection Time: 02/13/19  8:47 AM   Specimen: Sputum; Respiratory  Result Value Ref Range Status   Specimen Source-PJSRC TRACHEAL ASPIRATE  Final   Pneumocystis jiroveci Ag NEGATIVE  Final    Comment: Performed at [facility] Performed at [facility], [place] [address], [place],  [address]      Studies: Dg Chest Port 1 View  Result Date: 02/14/2019 CLINICAL DATA:  Shortness of breath EXAM: PORTABLE CHEST 1 VIEW COMPARISON:  02/13/2019 FINDINGS: Cardiac shadow is stable. The lungs are well aerated bilaterally. No focal infiltrate or sizable effusion is noted. No bony abnormality is seen. IMPRESSION: No acute abnormality noted. Electronically Signed   By: Inez Catalina M.D.   On: 02/14/2019 07:51    Scheduled Meds: . arformoterol  15 mcg Nebulization BID  . aspirin  325 mg Oral Daily  . Chlorhexidine Gluconate Cloth  6 each Topical Daily  . efavirenz-emtricitabine-tenofovir  1 tablet Oral QHS  . enoxaparin (LOVENOX) injection  40 mg Subcutaneous Daily  . gabapentin  300 mg Oral BID  . guaiFENesin-dextromethorphan  10 mL Oral Q6H  . insulin aspart  0-15 Units Subcutaneous Q4H  . ipratropium-albuterol  3 mL Nebulization QID  . mouth rinse  15 mL Mouth Rinse BID  . methylPREDNISolone (SOLU-MEDROL) injection  60 mg Intravenous BID  . venlafaxine XR  150 mg Oral Q breakfast   Continuous Infusions: . azithromycin 500 mg (02/14/19 2235)    Active Problems:   Acute and chronic respiratory failure (acute-on-chronic) ([place])      Desiree Hane  [facility]

## 2019-02-15 NOTE — Progress Notes (Signed)
Physical Therapy Treatment Patient Details Name: [name] MRN: [id number] DOB: 05-Sep-1945 Today's Date: 02/15/2019    History of Present Illness 77yF with history of COPD, HIV, NSCLC s/p XRT admitted with likely multifactorial acute on chronic hypercapnic and hypoxic respiratory failure    PT Comments    Focus of session to determine O2 requirement and patient demonstrated increased needs during mobility.  See O2 documentation in separate note.  Patient denies back pain during ambulation this session.  Seems likely close to baseline for mobility as reports rather sedentary at home.  PT to follow acutely.    Follow Up Recommendations  Home health PT(HH aide)     Equipment Recommendations  None recommended by PT    Recommendations for Other Services       Precautions / Restrictions Precautions Precautions: Fall Precaution Comments: watch O2    Mobility  Bed Mobility Overal bed mobility: Modified Independent                Transfers   Equipment used: Rolling walker (2 wheeled) Transfers: Sit to/from Stand Sit to Stand: Supervision         General transfer comment: assist for lines  Ambulation/Gait Ambulation/Gait assistance: Supervision Gait Distance (Feet): 200 Feet Assistive device: Rolling walker (2 wheeled) Gait Pattern/deviations: WFL(Within Functional Limits)     General Gait Details: ambulating withO2 @ 3LPM initially SpO2 drop to 86% so increased to 4LPM then SpO2 86%, up to 6LPM SoI2 88%   Stairs             Wheelchair Mobility    Modified Rankin (Stroke Patients Only)       Balance Overall balance assessment: Needs assistance   Sitting balance-Leahy Scale: Good     Standing balance support: Bilateral upper extremity supported Standing balance-Leahy Scale: Fair Standing balance comment: RW for ambulation                            Cognition Arousal/Alertness: Awake/alert Behavior During Therapy: WFL for  tasks assessed/performed Overall Cognitive Status: Within Functional Limits for tasks assessed                                        Exercises      General Comments General comments (skin integrity, edema, etc.): toileted on BSC S overall      Pertinent Vitals/Pain Pain Assessment: No/denies pain    Home Living                      Prior Function            PT Goals (current goals can now be found in the care plan section) Progress towards PT goals: Progressing toward goals    Frequency           PT Plan Current plan remains appropriate    Co-evaluation              AM-PAC PT "6 Clicks" Mobility   Outcome Measure  Help needed turning from your back to your side while in a flat bed without using bedrails?: None Help needed moving from lying on your back to sitting on the side of a flat bed without using bedrails?: None Help needed moving to and from a bed to a chair (including a wheelchair)?: None Help needed standing up from a chair  using your arms (e.g., wheelchair or bedside chair)?: None Help needed to walk in hospital room?: A Little Help needed climbing 3-5 steps with a railing? : A Little 6 Click Score: 22    End of Session Equipment Utilized During Treatment: Oxygen Activity Tolerance: Patient tolerated treatment well Patient left: in bed;with call bell/phone within reach   PT Visit Diagnosis: Other abnormalities of gait and mobility (R26.89)     Time: 5027-7412 PT Time Calculation (min) (ACUTE ONLY): 26 min  Charges:  $Gait Training: 8-22 mins $Therapeutic Activity: 8-22 mins                     [name], [place] [phone] 02/15/2019    [name] 02/15/2019, 12:39 PM

## 2019-02-16 LAB — COMPREHENSIVE METABOLIC PANEL
ALT: 30 U/L (ref 0–44)
AST: 28 U/L (ref 15–41)
Albumin: 3.2 g/dL — ABNORMAL LOW (ref 3.5–5.0)
Alkaline Phosphatase: 49 U/L (ref 38–126)
Anion gap: 11 (ref 5–15)
BUN: 19 mg/dL (ref 8–23)
CO2: 31 mmol/L (ref 22–32)
Calcium: 8.7 mg/dL — ABNORMAL LOW (ref 8.9–10.3)
Chloride: 98 mmol/L (ref 98–111)
Creatinine, Ser: 1.03 mg/dL — ABNORMAL HIGH (ref 0.44–1.00)
GFR calc Af Amer: >60 mL/min (ref >60)
GFR calc non Af Amer: 55 mL/min — ABNORMAL LOW (ref >60)
Glucose, Bld: 123 mg/dL — ABNORMAL HIGH (ref 70–99)
Potassium: 4.9 mmol/L (ref 3.5–5.1)
Sodium: 140 mmol/L (ref 135–145)
Total Bilirubin: 0.7 mg/dL (ref 0.3–1.2)
Total Protein: 6.5 g/dL (ref 6.5–8.1)

## 2019-02-16 LAB — CBC
HCT: 49.7 % — ABNORMAL HIGH (ref 36.0–46.0)
Hemoglobin: 13.3 g/dL (ref 12.0–15.0)
MCH: 22.5 pg — ABNORMAL LOW (ref 26.0–34.0)
MCHC: 26.8 g/dL — ABNORMAL LOW (ref 30.0–36.0)
MCV: 84.2 fL (ref 80.0–100.0)
Platelets: 176 10*3/uL (ref 150–400)
RBC: 5.9 MIL/uL — ABNORMAL HIGH (ref 3.87–5.11)
RDW: 21.4 % — ABNORMAL HIGH (ref 11.5–15.5)
WBC: 10.8 10*3/uL — ABNORMAL HIGH (ref 4.0–10.5)
nRBC: 0 % (ref 0.0–0.2)

## 2019-02-16 LAB — GLUCOSE, CAPILLARY
Glucose-Capillary: 129 mg/dL — ABNORMAL HIGH (ref 70–99)
Glucose-Capillary: 125 mg/dL — ABNORMAL HIGH (ref 70–99)
Glucose-Capillary: 84 mg/dL (ref 70–99)
Glucose-Capillary: 105 mg/dL — ABNORMAL HIGH (ref 70–99)
Glucose-Capillary: 113 mg/dL — ABNORMAL HIGH (ref 70–99)
Glucose-Capillary: 164 mg/dL — ABNORMAL HIGH (ref 70–99)

## 2019-02-16 MED ORDER — PREDNISONE 20 MG PO TABS: 40.0000 mg | ORAL_TABLET | Freq: Every day | ORAL | Status: DC

## 2019-02-16 MED ORDER — PREDNISONE 20 MG PO TABS
40.0000 mg | ORAL_TABLET | Freq: Every day | ORAL | Status: DC
  Administered 2019-02-17: 40 mg via ORAL
  Filled 2019-02-16: qty 2

## 2019-02-16 MED ORDER — IPRATROPIUM-ALBUTEROL 0.5-2.5 (3) MG/3ML IN SOLN
3.0000 mL | Freq: Three times a day (TID) | RESPIRATORY_TRACT | Status: DC
  Administered 2019-02-16 – 2019-02-17 (×3): 3 mL via RESPIRATORY_TRACT
  Filled 2019-02-16 (×4): qty 3

## 2019-02-16 NOTE — H&P (Addendum)
CM acknowledged consult for possible home oxygen and CPAP machine.  Pt informed CM that she was on 3 liters home oxygen PTA supplied by [employer] - pt will need portable tank for home transport.  CM will need order for titration up for home oxygen if increase is needed at discharge.  Pt informed CM that she already has a CPAP in the home however it has not been functional due to her losing a vital component of the machine.  CM contacted [employer] and per request provided pt the [employer] [facility] liaison number so she can contact them directly and request missing component - per pt she received the equipment approximately a year ago and therefore agency will not provide a new machine.    [employer] [facility] :  [phone] ext [phone]

## 2019-02-16 NOTE — Progress Notes (Addendum)
[facility]  PROGRESS NOTE  ELLIET GOODNOW YKZ:[id number] DOB: 1945/11/16 DOA: 02/11/2019 PCP: System, Provider Not In  Brief History    AMILIA VANDENBRINK is a 73 y.o. year old female with medical history significant for COPD, HIV, nonsmall cell lung cancer s/p radiation who presented on 02/11/2019 with shortness of breath, epigastric pain and was found to have acute on chronic respiratory failure with improvement on BiPAP in the ICU.  A & P   Acute on chronic hypercapnic/hypoxic respiratory failure secondary to multifactorial etiology (OHS/OSA/COPD/CHF/sedatives), continues to improve.  No longer on BiPAP, maintaining O2 sats greater than 92% on 5 L, will continue to wean (previous baseline O2 of 3 L).  Dissipate discharge in 24 hours if maintains normal respiratory status after discontinuation of Solu-Medrol and transition to prednisone today.  Continue pulmonary toilet with incentive spirometry, flutter valve use, continue scheduled duonebs/inhalers, completed azithromycin for COPD flare.   Acute encephalopathy, related to hypoxia, resolved with improvement in respiratory status.  Alert oriented x4, mental status exam.   HIV, stable.  PCP antigen negative continue home antiviral, CD4 406.   Elevated troponin, suspect leading to demand ischemia in setting of acute hypoxia, no symptoms consistent with ACS.  Troponin already downtrending.   Polypharmacy.  Suspect sedating medications likely contributed to hypoventilation, currently holding home Robaxin, gabapentin, Seroquel, can follow with outpatient provider) immune related to steroids.  Continue to closely monitor, sliding scale as needed.     DVT prophylaxis: Lovenox Code Status: Full code Family Communication: No family at bedside Disposition Plan: Discontinue IV Solu-Medrol, monitor respiratory status on oral prednisone if remains stable anticipate discharge in 24 hours home   [facility] Direct contact: see  [email or web address] (further directions at bottom of note if needed) 7PM-7AM contact night coverage as at bottom of note 02/16/2019, 8:52 AM  LOS: 5 days   Consultants  . PCCM  Procedures  . TTE, 02/12/2019   The left ventricle has normal systolic function with an ejection fraction of 60-65%. The cavity size was normal. There is mild concentric left ventricular hypertrophy. Left ventricular diastolic Doppler parameters are consistent with impaired  relaxation. Indeterminate filling pressures.  2. The right ventricle has moderately reduced systolic function. The cavity was mildly enlarged. There is mildly increased right ventricular wall thickness.  3. Right atrial size was mildly dilated.  4. Trivial pericardial effusion is present.  5. The aortic valve is tricuspid. Mild thickening of the aortic valve. Mild calcification of the aortic valve. Mild aortic annular calcification noted.  6. The mitral valve is degenerative. Mild thickening of the mitral valve leaflet. There is mild mitral annular calcification present.  7. The tricuspid valve is grossly normal. Tricuspid valve regurgitation is moderate.  8. The aorta is normal unless otherwise noted.  9. The inferior vena cava was dilated in size with >50% respiratory variability  Antibiotics  . Azithromycin  Interval History/Subjective  Feels breathing is much better.  Hopeful going home tomorrow  Objective   Vitals:  Vitals:   02/16/19 0712 02/16/19 0739  BP:  132/76  Pulse:    Resp:    Temp:  97.9 F (36.6 C)  SpO2: 95%     Exam:  Awake Alert, Oriented X 3, No new F.N deficits, Normal affect [place].AT,PERRAL Supple Neck,No JVD appreciated,  Symmetrical Chest wall movement, Good air movement bilaterally, no wheezes or crackles, no increased respiratory effort or distress on 5 L nasal cannula  RRR,No Gallops,Rubs or new Murmurs,  +ve B.Sounds, Abd  Soft, No tenderness, No organomegaly appriciated, No rebound - guarding or rigidity. No  Cyanosis, Clubbing or edema, No new Rash or bruise    I have personally reviewed the following:   Data Reviewed: Basic Metabolic Panel: Recent Labs  Lab 02/11/19 1924  02/12/19 0224 02/12/19 0435 02/13/19 0413 02/14/19 0355 02/15/19 0356 02/16/19 0431  NA 139   < > 141  --  141 140 140 140  K 4.0   < > 3.7  --  4.0 4.2 4.5 4.9  CL 98  --   --   --  102 101 100 98  CO2 31  --   --   --  30 31 32 31  GLUCOSE 122*  --   --   --  100* 91 119* 123*  BUN 12  --   --   --  15 20 18 19   CREATININE 0.94  --   --  1.12* 0.96 0.97 0.97 1.03*  CALCIUM 8.6*  --   --   --  8.1* 8.5* 8.8* 8.7*  MG  --   --   --   --  1.8  --   --   --   PHOS  --   --   --   --  3.1  --   --   --    < > = values in this interval not displayed.   Liver Function Tests: Recent Labs  Lab 02/11/19 1924 02/13/19 0413 02/14/19 0355 02/15/19 0356 02/16/19 0431  AST 64* 30 37 26 28  ALT 39 30 28 30 30   ALKPHOS 68 50 51 51 49  BILITOT 1.1 0.4 0.7 0.7 0.7  PROT 7.2 5.9* 6.5 6.9 6.5  ALBUMIN 3.2* 2.9* 3.1* 3.3* 3.2*   Recent Labs  Lab 02/11/19 1924  LIPASE 34   Recent Labs  Lab 02/13/19 0553 02/14/19 0355  AMMONIA 40* 45*   CBC: Recent Labs  Lab 02/11/19 1924  02/12/19 0435 02/13/19 0413 02/14/19 0355 02/15/19 0356 02/16/19 0431  WBC 8.7  --  6.3 7.7 8.7 7.1 10.8*  NEUTROABS 6.3  --   --   --   --   --   --   HGB 14.4   < > 13.8 12.8 13.3 13.5 13.3  HCT 54.0*   < > 51.1* 46.7* 48.9* 50.4* 49.7*  MCV 85.2  --  84.6 82.8 84.2 84.0 84.2  PLT 181  --  157 165 156 167 176   < > = values in this interval not displayed.   Cardiac Enzymes: No results for input(s): CKTOTAL, CKMB, CKMBINDEX, TROPONINI in the last 168 hours. BNP (last 3 results) Recent Labs    02/11/19 1924 02/13/19 0413  BNP 533.2* 165.0*    ProBNP (last 3 results) No results for input(s): PROBNP in the last 8760 hours.  CBG: Recent Labs  Lab 02/15/19 1621 02/15/19 2106 02/15/19 2323 02/16/19 0401 02/16/19 0744   GLUCAP 109* 173* 99 129* 125*    Recent Results (from the past 240 hour(s))  SARS Coronavirus 2 [facility] order, Performed in [facility] hospital lab) Nasopharyngeal Nasopharyngeal Swab     Status: None   Collection Time: 02/11/19  6:27 PM   Specimen: Nasopharyngeal Swab  Result Value Ref Range Status   SARS Coronavirus 2 NEGATIVE NEGATIVE Final    Comment: (NOTE) If result is NEGATIVE SARS-CoV-2 target nucleic acids are NOT DETECTED. The SARS-CoV-2 RNA is generally detectable in upper and lower  respiratory specimens during the  acute phase of infection. The lowest  concentration of SARS-CoV-2 viral copies this assay can detect is 250  copies / mL. A negative result does not preclude SARS-CoV-2 infection  and should not be used as the sole basis for treatment or other  patient management decisions.  A negative result may occur with  improper specimen collection / handling, submission of specimen other  than nasopharyngeal swab, presence of viral mutation(s) within the  areas targeted by this assay, and inadequate number of viral copies  (<250 copies / mL). A negative result must be combined with clinical  observations, patient history, and epidemiological information. If result is POSITIVE SARS-CoV-2 target nucleic acids are DETECTED. The SARS-CoV-2 RNA is generally detectable in upper and lower  respiratory specimens dur ing the acute phase of infection.  Positive  results are indicative of active infection with SARS-CoV-2.  Clinical  correlation with patient history and other diagnostic information is  necessary to determine patient infection status.  Positive results do  not rule out bacterial infection or co-infection with other viruses. If result is PRESUMPTIVE POSTIVE SARS-CoV-2 nucleic acids MAY BE PRESENT.   A presumptive positive result was obtained on the submitted specimen  and confirmed on repeat testing.  While 2019 novel coronavirus  (SARS-CoV-2) nucleic acids may  be present in the submitted sample  additional confirmatory testing may be necessary for epidemiological  and / or clinical management purposes  to differentiate between  SARS-CoV-2 and other Sarbecovirus currently known to infect humans.  If clinically indicated additional testing with an alternate test  methodology [phone]) is advised. The SARS-CoV-2 RNA is generally  detectable in upper and lower respiratory sp ecimens during the acute  phase of infection. The expected result is Negative. Fact Sheet for Patients:  StrictlyIdeas.no Fact Sheet for Healthcare Providers: BankingDealers.co.za This test is not yet approved or cleared by the [place] FDA and has been authorized for detection and/or diagnosis of SARS-CoV-2 by FDA under an Emergency Use Authorization (EUA).  This EUA will remain in effect (meaning this test can be used) for the duration of the COVID-19 declaration under Section 564(b)(1) of the Act, 21 U.S.C. section 360bbb-3(b)(1), unless the authorization is terminated or revoked sooner. Performed at [facility], [place] [address], [place], [place] [address]   MRSA PCR Screening     Status: None   Collection Time: 02/11/19 11:09 PM   Specimen: Nasopharyngeal  Result Value Ref Range Status   MRSA by PCR NEGATIVE NEGATIVE Final    Comment:        The GeneXpert MRSA Assay (FDA approved for NASAL specimens only), is one component of a comprehensive MRSA colonization surveillance program. It is not intended to diagnose MRSA infection nor to guide or monitor treatment for MRSA infections. Performed at [facility], [place] [address], [place], [place] [address]   Respiratory Panel by PCR     Status: None   Collection Time: 02/12/19  1:36 AM  Result Value Ref Range Status   Adenovirus NOT DETECTED NOT DETECTED Final   Coronavirus 229E NOT DETECTED NOT DETECTED Final    Comment: (NOTE) The Coronavirus on the  Respiratory Panel, DOES NOT test for the novel  Coronavirus (2019 nCoV)    Coronavirus HKU1 NOT DETECTED NOT DETECTED Final   Coronavirus NL63 NOT DETECTED NOT DETECTED Final   Coronavirus OC43 NOT DETECTED NOT DETECTED Final   Metapneumovirus NOT DETECTED NOT DETECTED Final   Rhinovirus / Enterovirus NOT DETECTED NOT DETECTED  Final   Influenza A NOT DETECTED NOT DETECTED Final   Influenza B NOT DETECTED NOT DETECTED Final   Parainfluenza Virus 1 NOT DETECTED NOT DETECTED Final   Parainfluenza Virus 2 NOT DETECTED NOT DETECTED Final   Parainfluenza Virus 3 NOT DETECTED NOT DETECTED Final   Parainfluenza Virus 4 NOT DETECTED NOT DETECTED Final   Respiratory Syncytial Virus NOT DETECTED NOT DETECTED Final   Bordetella pertussis NOT DETECTED NOT DETECTED Final   Chlamydophila pneumoniae NOT DETECTED NOT DETECTED Final   Mycoplasma pneumoniae NOT DETECTED NOT DETECTED Final    Comment: Performed at [facility], [place] [address] [place], [place] [address]  Pneumocystis smear by DFA     Status: None   Collection Time: 02/13/19  8:47 AM   Specimen: Sputum; Respiratory  Result Value Ref Range Status   Specimen Source-PJSRC TRACHEAL ASPIRATE  Final   Pneumocystis jiroveci Ag NEGATIVE  Final    Comment: Performed at [facility] Performed at [facility], [place] [address], [place], [place] [address]      Studies: No results found.  Scheduled Meds: . arformoterol  15 mcg Nebulization BID  . Chlorhexidine Gluconate Cloth  6 each Topical Daily  . efavirenz-emtricitabine-tenofovir  1 tablet Oral QHS  . enoxaparin (LOVENOX) injection  40 mg Subcutaneous Daily  . gabapentin  300 mg Oral BID  . guaiFENesin-dextromethorphan  10 mL Oral Q6H  . insulin aspart  0-15 Units Subcutaneous Q4H  . ipratropium-albuterol  3 mL Nebulization TID  . mouth rinse  15 mL Mouth Rinse BID  . methylPREDNISolone (SOLU-MEDROL) injection  60 mg Intravenous BID  . venlafaxine XR  150 mg Oral Q  breakfast   Continuous Infusions:   Active Problems:   Hyperglycemia   COPD exacerbation (HCC)   Acute and chronic respiratory failure (acute-on-chronic) ([place])      Desiree Hane  [facility]

## 2019-02-16 NOTE — Care Management Important Message (Signed)
Important Message  Patient Details  Name: [name] MRN: [id number] Date of Birth: 1945/11/30   Medicare Important Message Given:  Yes     Orbie Pyo 02/16/2019, 2:33 PM

## 2019-02-16 NOTE — Progress Notes (Signed)
Physical Therapy Treatment Patient Details Name: [name] MRN: [id number] DOB: [birth year] Today's Date: 02/16/2019    History of Present Illness 38yF with history of COPD, HIV, NSCLC s/p XRT admitted with likely multifactorial acute on chronic hypercapnic and hypoxic respiratory failure    PT Comments    Pt progressing well with mobility. Ambulated 300 feet with RW supervision on 4 L O2 with SpO2 96-100%. Pt in recliner at end of session. Current POC remains appropriate.    Follow Up Recommendations  Home health PT(HH aide)     Equipment Recommendations  None recommended by PT    Recommendations for Other Services       Precautions / Restrictions Precautions Precautions: Fall;Other (comment) Precaution Comments: watch O2    Mobility  Bed Mobility Overal bed mobility: Modified Independent                Transfers Overall transfer level: Needs assistance Equipment used: Ambulation equipment used Transfers: Sit to/from Stand;Stand Pivot Transfers Sit to Stand: Supervision Stand pivot transfers: Supervision       General transfer comment: assist for lines  Ambulation/Gait Ambulation/Gait assistance: Supervision Gait Distance (Feet): 300 Feet Assistive device: Rolling walker (2 wheeled) Gait Pattern/deviations: WFL(Within Functional Limits) Gait velocity: decreased Gait velocity interpretation: 1.31 - 2.62 ft/sec, indicative of limited community ambulator General Gait Details: Ambulated on 4 L O2 with SpO2 96-100%   Stairs             Wheelchair Mobility    Modified Rankin (Stroke Patients Only)       Balance Overall balance assessment: Needs assistance Sitting-balance support: No upper extremity supported;Feet supported Sitting balance-Leahy Scale: Good     Standing balance support: Bilateral upper extremity supported;No upper extremity supported Standing balance-Leahy Scale: Fair Standing balance comment: transfer without AD/UE  support, RW for hallway ambulation                            Cognition Arousal/Alertness: Awake/alert Behavior During Therapy: WFL for tasks assessed/performed Overall Cognitive Status: Within Functional Limits for tasks assessed                                        Exercises      General Comments        Pertinent Vitals/Pain Pain Assessment: No/denies pain    Home Living                      Prior Function            PT Goals (current goals can now be found in the care plan section) Acute Rehab PT Goals Patient Stated Goal: home PT Goal Formulation: With patient Time For Goal Achievement: 02/27/19 Potential to Achieve Goals: Good Progress towards PT goals: Progressing toward goals    Frequency    Min 3X/week      PT Plan Current plan remains appropriate    Co-evaluation              AM-PAC PT "6 Clicks" Mobility   Outcome Measure  Help needed turning from your back to your side while in a flat bed without using bedrails?: None Help needed moving from lying on your back to sitting on the side of a flat bed without using bedrails?: None Help needed moving to and from a bed to a  chair (including a wheelchair)?: None Help needed standing up from a chair using your arms (e.g., wheelchair or bedside chair)?: None Help needed to walk in hospital room?: A Little Help needed climbing 3-5 steps with a railing? : A Little 6 Click Score: 22    End of Session Equipment Utilized During Treatment: Oxygen;Gait belt Activity Tolerance: Patient tolerated treatment well Patient left: in chair;with call bell/phone within reach Nurse Communication: Mobility status PT Visit Diagnosis: Other abnormalities of gait and mobility (R26.89)     Time: 6016-5800 PT Time Calculation (min) (ACUTE ONLY): 20 min  Charges:  $Gait Training: 8-22 mins                     Lorrin Goodell, PT  Office # [phone] Pager  [phone]    Lorriane Shire 02/16/2019, 12:27 PM

## 2019-02-16 NOTE — TOC Initial Note (Signed)
Transition of Care [facility]) - Initial/Assessment Note    Patient Details  Name: [name] MRN: [id number] Date of Birth: 1946-06-01  Transition of Care [facility]) CM/SW Contact:    Maryclare Labrador, RN Phone Number: 02/16/2019, 3:35 PM  Clinical Narrative:                 PTA independent from home.  Pt is interested in [facility] as recommended - CM informed of barriers with [place] and insurance - pt is in agreement for CM to place referrals to see which agency will accept insurance.  The following agencies have declined to accept the pt;  [facility], [facility], [facility], [facility], [facility].Alvis Lemmings will review pt and follow back up with CM     Expected Discharge Plan: [place] Barriers to Discharge: Continued Medical Work up   Patient Goals and CMS Choice        Expected Discharge Plan and Services Expected Discharge Plan: [place]                                              Prior Living Arrangements/Services                       Activities of Daily Living Home Assistive Devices/Equipment: None ADL Screening (condition at time of admission) Patient's cognitive ability adequate to safely complete daily activities?: Yes Is the patient deaf or have difficulty hearing?: No Does the patient have difficulty seeing, even when wearing glasses/contacts?: Yes Does the patient have difficulty concentrating, remembering, or making decisions?: No Patient able to express need for assistance with ADLs?: Yes Does the patient have difficulty dressing or bathing?: No Independently performs ADLs?: Yes (appropriate for developmental age) Does the patient have difficulty walking or climbing stairs?: No Weakness of Legs: None Weakness of Arms/Hands: None  Permission Sought/Granted                  Emotional Assessment              Admission diagnosis:  COPD exacerbation ([place]) [J44.1] Acute on chronic respiratory failure with  hypercapnia ([place]) [J96.22] Patient Active Problem List   Diagnosis Date Noted  . Acute and chronic respiratory failure (acute-on-chronic) ([place]) 02/11/2019  . Sepsis ([place]) 03/28/2015  . CAP (community acquired pneumonia) 03/28/2015  . UTI (lower urinary tract infection) 03/28/2015  . Respiratory failure ([place]) 03/28/2015  . Non-small cell lung cancer ([place])   . Malignant neoplasm of upper lobe, bronchus or lung 01/03/2014  . Lung cancer ([place]) 11/08/2013  . COPD with acute exacerbation ([place]) 10/30/2013  . COPD exacerbation ([place]) 10/30/2013  . Other and unspecified noninfectious gastroenteritis and colitis(558.9) 11/20/2012  . Rectal bleed 11/19/2012  . Obesity 05/05/2012  . Renal insufficiency 05/05/2012  . Hyperglycemia 05/05/2012  . Asthma 12/10/2011  . Cigarette smoker 12/10/2011  . Hyperlipidemia 12/11/2010  . Hypertension 12/11/2010  . ALLERGIC RHINITIS, SEASONAL 07/03/2010  . COPD 07/03/2010  . Human immunodeficiency virus (HIV) disease ([place]) 12/09/2006  . DEPRESSION 12/09/2006  . BREAST MASS, BENIGN 12/09/2006  . OSTEOPOROSIS 12/09/2006   PCP:  System, Provider Not In Pharmacy:   [place], [place] [address] [place] [address] Phone: [phone] Fax: [place], [place] -  [place] [address] Phone: [phone] Fax: [phone]     Social Determinants of Health (SDOH) Interventions    Readmission Risk Interventions No flowsheet data found.

## 2019-02-17 DIAGNOSIS — I5033 Acute on chronic diastolic (congestive) heart failure: Secondary | ICD-10-CM

## 2019-02-17 DIAGNOSIS — R739 Hyperglycemia, unspecified: Secondary | ICD-10-CM

## 2019-02-17 LAB — GLUCOSE, CAPILLARY
Glucose-Capillary: 136 mg/dL — ABNORMAL HIGH (ref 70–99)
Glucose-Capillary: 90 mg/dL (ref 70–99)
Glucose-Capillary: 92 mg/dL (ref 70–99)
Glucose-Capillary: 92 mg/dL (ref 70–99)

## 2019-02-17 MED ORDER — PREDNISONE 20 MG PO TABS: 40.0000 mg | ORAL_TABLET | Freq: Every day | ORAL | 0 refills | Status: DC

## 2019-02-17 NOTE — TOC Transition Note (Addendum)
Transition of Care [facility]) - CM/SW Discharge Note   Patient Details  Name: CELIE DESROCHERS MRN: [id number] Date of Birth: [birth year]  Transition of Care [facility]) CM/SW Contact:  Maryclare Labrador, RN Phone Number: 02/17/2019, 12:41 PM   Clinical Narrative:  Pt to discharge home today - [facility] has accepted pt for [facility] - awaiting [place] orders.   Pt will need titration up with home oxygen - order received. CM requested bedside nurse to perform new ambulatory test.  CM left VM for [employer] regarding new oxygen order and the need for a portable tank for transport home.  Pt informed CM that she has not contacted [facility] therapist as requested yesterday to get the missing component for this home CPAP machine.  CM also placed this number on the AVS and encouraged pt to contact them as soon as possible - attending made aware.    CM contacted [facility] and requested liaison to call pt in her hospital room regarding CPAP machine.  Pt in agreement with CM setting up appt with [facility] for hospital follow up - see AVS  Update:  Per Ambulatory Note pt has acceptable  Oxygen saturations on 3 liters - per nurse order for 4 liters home oxygen will be discontinued - [employer] made aware      Barriers to Discharge: Continued Medical Work up   Patient Goals and CMS Choice        Discharge Placement                       Discharge Plan and Services                                     Social Determinants of Health (SDOH) Interventions     Readmission Risk Interventions No flowsheet data found.

## 2019-02-17 NOTE — Progress Notes (Signed)
Daily Nursing Note  Received report from [place], [place]. Upon bedside rounds noted to be sleeping on CPAP   Blood sugar of 90 this AM, no coverage needed Blood sugar of 136 this afternoon, 2 units given  Discharge orders written on patient in afternoon.   Patient received home DME O2 delivery and arrangement of transportation  Discharge instructions reviewed with patient, she exemplified understanding  Discharged home via [facility]

## 2019-02-17 NOTE — Discharge Summary (Signed)
NYASHIA RANEY FXT:[id number] DOB: [birth year] DOA: 02/11/2019  PCP: System, Provider Not In  Admit date: 02/11/2019 Discharge date: 02/17/2019  Admitted From: Home Disposition:  Home  Recommendations for Outpatient Follow-up:  1. Follow up with PCP in 1-2 weeks 2. New medications: Prednisone 40 mg x 3days   Home Health: PT, aide  Equipment/Devices: 4 L oxygen, portable tank, CPAP (has home CPAP machine but missing a component, given number to follow with [facility] Liaison) Discharge Condition: Stable CODE STATUS:FULL Diet recommendation: Heart Healthy   Brief/Interim Summary: History of present illness:  JAILYNN LAVALAIS is a 73 y.o. year old female with medical history significant for COPD, HIV, nonsmall cell lung cancer s/p radiation who presented on 02/11/2019 with shortness of breath, epigastric pain and was found to have acute on chronic respiratory failure initially requiring admission to ICU for BiPAP patient did not require intubation remaining hospital course addressed in problem based format below:   Hospital Course:    Acute on chronic hypercapnic/hypoxic respiratory failure secondary to multifactorial etiology (OHS/OSA/COPD/CHF/sedatives),     On admission found to have mild interstitial edema on CXR with slightly elevated BNP of 165, CO2 of79 and requiring BiPAP  in ICU. Presentation seems consistent with CHF/COPD exacerbation leading to hypoventilation.  TTE showed preserved EF with diastolic dysfunction.  Was able to transition off BiPAP to 4 L oxygen (previous baseline 3 L), and transition from IV Solu-Medrol to oral prednisone, only needed IV Lasix x1 during hospital course does not need continued diuresis on discharge given patient is euvolemic.  In hospital, she was continued on CPAP therapy at night consistent with her home regimen.  On discharge provided increased oxygen with portable tank, home health PT and nursing aide.  Recommend close follow with PCP.  Will  complete prednisone burst as outpatient. Continue pulmonary toilet with incentive spirometry, flutter valve use, completed azithromycin for COPD flare.   Acute encephalopathy, related to hypoxia as well as medication effect from fentanyl given in ED on admission, resolved with improvement in respiratory status.  Alert oriented x4, mental status exam.   HIV, stable.  PCP antigen negative, continue home antiviral, CD4 406.   Elevated troponin, suspect demand ischemia in setting of acute hypoxia/hypercarbia, no symptoms consistent with ACS.  High-sensitivity Troponin downtrended ([phone]), nonischemic EKG, no wall motion abnormalities on TTE   Polypharmacy.  Suspect sedating medications likely contributed to hypoventilation, currently holding home Robaxin, gabapentin, Seroquel, can follow with outpatient provider.  Continue to closely monitor, sliding scale as needed.   Consultations:  PCCM  Procedures/Studies:  TTE, 02/12/2019  The left ventricle has normal systolic function with an ejection fraction of 60-65%. The cavity size was normal. There is mild concentric left ventricular hypertrophy. Left ventricular diastolic Doppler parameters are consistent with impaired  relaxation. Indeterminate filling pressures. 2. The right ventricle has moderately reduced systolic function. The cavity was mildly enlarged. There is mildly increased right ventricular wall thickness. 3. Right atrial size was mildly dilated. 4. Trivial pericardial effusion is present. 5. The aortic valve is tricuspid. Mild thickening of the aortic valve. Mild calcification of the aortic valve. Mild aortic annular calcification noted. 6. The mitral valve is degenerative. Mild thickening of the mitral valve leaflet. There is mild mitral annular calcification present. 7. The tricuspid valve is grossly normal. Tricuspid valve regurgitation is moderate. 8. The aorta is normal unless otherwise noted. 9. The  inferior vena cava was dilated in size with >50% respiratory variability Subjective:  Discharge Exam: Vitals:  02/17/19 0723 02/17/19 0840  BP: 130/83   Pulse: 82   Resp: 19   Temp: 98.8 F (37.1 C)   SpO2: 100% 99%   Vitals:   02/16/19 1924 02/16/19 2310 02/17/19 0723 02/17/19 0840  BP:  130/70 130/83   Pulse:  82 82   Resp:   19   Temp:  99.3 F (37.4 C) 98.8 F (37.1 C)   TempSrc:  Oral Oral   SpO2: 97% 99% 100% 99%  Weight:      Height:        General: Lying in bed, no apparent distress Eyes: EOMI, anicteric ENT: Oral Mucosa clear and moist Cardiovascular: regular rate and rhythm, no murmurs, rubs or gallops, no edema, Respiratory: Normal respiratory effort on 4 L  lungs clear to auscultation bilaterally Abdomen: soft, non-distended, non-tender, normal bowel sounds Skin: No Rash Neurologic: Grossly no focal neuro deficit.Mental status AAOx3, speech normal, Psychiatric:Appropriate affect, and mood  Discharge Diagnoses:  Active Problems:   Hyperglycemia   COPD exacerbation (HCC)   Acute and chronic respiratory failure (acute-on-chronic) ([place])    Discharge Instructions  Discharge Instructions    Diet - low sodium heart healthy   Complete by: As directed    Increase activity slowly   Complete by: As directed      Allergies as of 02/17/2019   No Known Allergies     Medication List    TAKE these medications   albuterol 108 (90 Base) MCG/ACT inhaler Commonly known as: VENTOLIN HFA Inhale 1 puff into the lungs every 6 (six) hours as needed for shortness of breath.   albuterol (2.5 MG/3ML) 0.083% nebulizer solution Commonly known as: PROVENTIL Take 3 mLs (2.5 mg total) by nebulization every 4 (four) hours as needed for wheezing or shortness of breath.   aspirin EC 81 MG tablet Take 81 mg by mouth daily.   Atripla 600-200-300 MG tablet Generic drug: efavirenz-emtricitabine-tenofovir TAKE 1 TABLET BY MOUTH EVERY NIGHT AT BEDTIME   Calcium 600+D  600-400 MG-UNIT tablet Generic drug: Calcium Carbonate-Vitamin D Take 1 tablet by mouth daily.   Fluticasone-Salmeterol 500-50 MCG/DOSE Aepb Commonly known as: ADVAIR Inhale 1 puff into the lungs every 12 (twelve) hours.   gabapentin 300 MG capsule Commonly known as: NEURONTIN TAKE 2 CAPSULES BY MOUTH THREE TIMES A DAY What changed: See the new instructions.   ibuprofen 600 MG tablet Commonly known as: ADVIL Take 1 tablet (600 mg total) by mouth every 8 (eight) hours as needed. What changed: reasons to take this   loratadine 10 MG tablet Commonly known as: CLARITIN Take 10 mg by mouth daily.   methocarbamol 500 MG tablet Commonly known as: Robaxin-750 Take 1 tablet (500 mg total) by mouth every 6 (six) hours as needed for muscle spasms.   montelukast 10 MG tablet Commonly known as: SINGULAIR Take 1 tablet (10 mg total) by mouth at bedtime.   omega-3 acid ethyl esters 1 g capsule Commonly known as: LOVAZA Take 1 g by mouth daily.   predniSONE 20 MG tablet Commonly known as: DELTASONE Take 2 tablets (40 mg total) by mouth daily with breakfast.   Spiriva Respimat 2.5 MCG/ACT Aers Generic drug: Tiotropium Bromide Monohydrate INHALE 2 PUFFS INTO THE LUNGS DAILY What changed: See the new instructions.   tiZANidine 4 MG capsule Commonly known as: ZANAFLEX Take 4 mg by mouth 2 (two) times daily.   venlafaxine XR 150 MG 24 hr capsule Commonly known as: EFFEXOR-XR Take 150 mg by mouth daily with breakfast.  VITAMIN A PO Take 1 tablet by mouth daily.   VITAMIN B50 COMPLEX PO Take 1 tablet by mouth daily.   vitamin C 100 MG tablet Take 100 mg by mouth daily.            Durable Medical Equipment  (From admission, onward)         Start     Ordered   02/17/19 1253  For home use only DME oxygen  Once    Question Answer Comment  Length of Need 12 Months   Mode or (Route) Nasal cannula   Liters per Minute 4   Frequency Continuous (stationary and portable  oxygen unit needed)   Oxygen delivery system Gas      02/17/19 1252         Follow-up Information    [facility] Follow up.   Why: Please contact [facility] Therapist regarding your CPAP machine [facility] : [phone] ext [phone]         No Known Allergies      The results of significant diagnostics from this hospitalization (including imaging, microbiology, ancillary and laboratory) are listed below for reference.     Microbiology: Recent Results (from the past 240 hour(s))  SARS Coronavirus 2 [facility] order, Performed in [facility] hospital lab) Nasopharyngeal Nasopharyngeal Swab     Status: None   Collection Time: 02/11/19  6:27 PM   Specimen: Nasopharyngeal Swab  Result Value Ref Range Status   SARS Coronavirus 2 NEGATIVE NEGATIVE Final    Comment: (NOTE) If result is NEGATIVE SARS-CoV-2 target nucleic acids are NOT DETECTED. The SARS-CoV-2 RNA is generally detectable in upper and lower  respiratory specimens during the acute phase of infection. The lowest  concentration of SARS-CoV-2 viral copies this assay can detect is 250  copies / mL. A negative result does not preclude SARS-CoV-2 infection  and should not be used as the sole basis for treatment or other  patient management decisions.  A negative result may occur with  improper specimen collection / handling, submission of specimen other  than nasopharyngeal swab, presence of viral mutation(s) within the  areas targeted by this assay, and inadequate number of viral copies  (<250 copies / mL). A negative result must be combined with clinical  observations, patient history, and epidemiological information. If result is POSITIVE SARS-CoV-2 target nucleic acids are DETECTED. The SARS-CoV-2 RNA is generally detectable in upper and lower  respiratory specimens dur ing the acute phase of infection.  Positive  results are indicative of active infection with SARS-CoV-2.  Clinical  correlation with  patient history and other diagnostic information is  necessary to determine patient infection status.  Positive results do  not rule out bacterial infection or co-infection with other viruses. If result is PRESUMPTIVE POSTIVE SARS-CoV-2 nucleic acids MAY BE PRESENT.   A presumptive positive result was obtained on the submitted specimen  and confirmed on repeat testing.  While 2019 novel coronavirus  (SARS-CoV-2) nucleic acids may be present in the submitted sample  additional confirmatory testing may be necessary for epidemiological  and / or clinical management purposes  to differentiate between  SARS-CoV-2 and other Sarbecovirus currently known to infect humans.  If clinically indicated additional testing with an alternate test  methodology [phone]) is advised. The SARS-CoV-2 RNA is generally  detectable in upper and lower respiratory sp ecimens during the acute  phase of infection. The expected result is Negative. Fact Sheet for Patients:  StrictlyIdeas.no Fact Sheet for Healthcare Providers: BankingDealers.co.za This  test is not yet approved or cleared by the [place] and has been authorized for detection and/or diagnosis of SARS-CoV-2 by FDA under an Emergency Use Authorization (EUA).  This EUA will remain in effect (meaning this test can be used) for the duration of the COVID-19 declaration under Section 564(b)(1) of the Act, 21 U.S.C. section 360bbb-3(b)(1), unless the authorization is terminated or revoked sooner. Performed at [facility], [place] [address], [place], [place] [address]   MRSA PCR Screening     Status: None   Collection Time: 02/11/19 11:09 PM   Specimen: Nasopharyngeal  Result Value Ref Range Status   MRSA by PCR NEGATIVE NEGATIVE Final    Comment:        The GeneXpert MRSA Assay (FDA approved for NASAL specimens only), is one component of a comprehensive MRSA colonization surveillance program.  It is not intended to diagnose MRSA infection nor to guide or monitor treatment for MRSA infections. Performed at [facility], [place] [address], [place], [place] [address]   Respiratory Panel by PCR     Status: None   Collection Time: 02/12/19  1:36 AM  Result Value Ref Range Status   Adenovirus NOT DETECTED NOT DETECTED Final   Coronavirus 229E NOT DETECTED NOT DETECTED Final    Comment: (NOTE) The Coronavirus on the Respiratory Panel, DOES NOT test for the novel  Coronavirus (2019 nCoV)    Coronavirus HKU1 NOT DETECTED NOT DETECTED Final   Coronavirus NL63 NOT DETECTED NOT DETECTED Final   Coronavirus OC43 NOT DETECTED NOT DETECTED Final   Metapneumovirus NOT DETECTED NOT DETECTED Final   Rhinovirus / Enterovirus NOT DETECTED NOT DETECTED Final   Influenza A NOT DETECTED NOT DETECTED Final   Influenza B NOT DETECTED NOT DETECTED Final   Parainfluenza Virus 1 NOT DETECTED NOT DETECTED Final   Parainfluenza Virus 2 NOT DETECTED NOT DETECTED Final   Parainfluenza Virus 3 NOT DETECTED NOT DETECTED Final   Parainfluenza Virus 4 NOT DETECTED NOT DETECTED Final   Respiratory Syncytial Virus NOT DETECTED NOT DETECTED Final   Bordetella pertussis NOT DETECTED NOT DETECTED Final   Chlamydophila pneumoniae NOT DETECTED NOT DETECTED Final   Mycoplasma pneumoniae NOT DETECTED NOT DETECTED Final    Comment: Performed at [facility], [place] [address], [place], [place] [address]  Pneumocystis smear by DFA     Status: None   Collection Time: 02/13/19  8:47 AM   Specimen: Sputum; Respiratory  Result Value Ref Range Status   Specimen Source-PJSRC TRACHEAL ASPIRATE  Final   Pneumocystis jiroveci Ag NEGATIVE  Final    Comment: Performed at [facility] Performed at [facility], [place] [address] [place], [place] [address]      Labs: BNP (last 3 results) Recent Labs    02/11/19 1924 02/13/19 0413  BNP 533.2* 315.4*   Basic Metabolic Panel: Recent Labs  Lab  02/11/19 1924  02/12/19 0224 02/12/19 0435 02/13/19 0413 02/14/19 0355 02/15/19 0356 02/16/19 0431  NA 139   < > 141  --  141 140 140 140  K 4.0   < > 3.7  --  4.0 4.2 4.5 4.9  CL 98  --   --   --  102 101 100 98  CO2 31  --   --   --  30 31 32 31  GLUCOSE 122*  --   --   --  100* 91 119* 123*  BUN 12  --   --   --  15 20 18 19   CREATININE 0.94  --   --  1.12* 0.96 0.97 0.97 1.03*  CALCIUM 8.6*  --   --   --  8.1* 8.5* 8.8* 8.7*  MG  --   --   --   --  1.8  --   --   --   PHOS  --   --   --   --  3.1  --   --   --    < > = values in this interval not displayed.   Liver Function Tests: Recent Labs  Lab 02/11/19 1924 02/13/19 0413 02/14/19 0355 02/15/19 0356 02/16/19 0431  AST 64* 30 37 26 28  ALT 39 30 28 30 30   ALKPHOS 68 50 51 51 49  BILITOT 1.1 0.4 0.7 0.7 0.7  PROT 7.2 5.9* 6.5 6.9 6.5  ALBUMIN 3.2* 2.9* 3.1* 3.3* 3.2*   Recent Labs  Lab 02/11/19 1924  LIPASE 34   Recent Labs  Lab 02/13/19 0553 02/14/19 0355  AMMONIA 40* 45*   CBC: Recent Labs  Lab 02/11/19 1924  02/12/19 0435 02/13/19 0413 02/14/19 0355 02/15/19 0356 02/16/19 0431  WBC 8.7  --  6.3 7.7 8.7 7.1 10.8*  NEUTROABS 6.3  --   --   --   --   --   --   HGB 14.4   < > 13.8 12.8 13.3 13.5 13.3  HCT 54.0*   < > 51.1* 46.7* 48.9* 50.4* 49.7*  MCV 85.2  --  84.6 82.8 84.2 84.0 84.2  PLT 181  --  157 165 156 167 176   < > = values in this interval not displayed.   Cardiac Enzymes: No results for input(s): CKTOTAL, CKMB, CKMBINDEX, TROPONINI in the last 168 hours. BNP: Invalid input(s): POCBNP CBG: Recent Labs  Lab 02/16/19 1930 02/17/19 0017 02/17/19 0348 02/17/19 0718 02/17/19 1145  GLUCAP 164* 92 92 90 136*   D-Dimer No results for input(s): DDIMER in the last 72 hours. Hgb A1c No results for input(s): HGBA1C in the last 72 hours. Lipid Profile No results for input(s): CHOL, HDL, LDLCALC, TRIG, CHOLHDL, LDLDIRECT in the last 72 hours. Thyroid function studies No results for  input(s): TSH, T4TOTAL, T3FREE, THYROIDAB in the last 72 hours.  Invalid input(s): FREET3 Anemia work up No results for input(s): VITAMINB12, FOLATE, FERRITIN, TIBC, IRON, RETICCTPCT in the last 72 hours. Urinalysis    Component Value Date/Time   COLORURINE YELLOW 03/28/2015 1227   APPEARANCEUR CLOUDY (A) 03/28/2015 1227   LABSPEC 1.014 03/28/2015 1227   PHURINE 6.0 03/28/2015 1227   GLUCOSEU NEGATIVE 03/28/2015 1227   HGBUR TRACE (A) 03/28/2015 1227   BILIRUBINUR NEGATIVE 03/28/2015 1227   KETONESUR NEGATIVE 03/28/2015 1227   PROTEINUR NEGATIVE 03/28/2015 1227   UROBILINOGEN 0.2 03/28/2015 1227   NITRITE POSITIVE (A) 03/28/2015 1227   LEUKOCYTESUR TRACE (A) 03/28/2015 1227   Sepsis Labs Invalid input(s): PROCALCITONIN,  WBC,  LACTICIDVEN Microbiology Recent Results (from the past 240 hour(s))  SARS Coronavirus 2 [facility] order, Performed in [facility] hospital lab) Nasopharyngeal Nasopharyngeal Swab     Status: None   Collection Time: 02/11/19  6:27 PM   Specimen: Nasopharyngeal Swab  Result Value Ref Range Status   SARS Coronavirus 2 NEGATIVE NEGATIVE Final    Comment: (NOTE) If result is NEGATIVE SARS-CoV-2 target nucleic acids are NOT DETECTED. The SARS-CoV-2 RNA is generally detectable in upper and lower  respiratory specimens during the acute phase of infection. The lowest  concentration of SARS-CoV-2 viral copies this assay can detect is 250  copies / mL. A negative result does not preclude SARS-CoV-2 infection  and should not be used as the sole basis for treatment or other  patient management decisions.  A negative result may occur with  improper specimen collection / handling, submission of specimen other  than nasopharyngeal swab, presence of viral mutation(s) within the  areas targeted by this assay, and inadequate number of viral copies  (<250 copies / mL). A negative result must be combined with clinical  observations, patient history, and epidemiological  information. If result is POSITIVE SARS-CoV-2 target nucleic acids are DETECTED. The SARS-CoV-2 RNA is generally detectable in upper and lower  respiratory specimens dur ing the acute phase of infection.  Positive  results are indicative of active infection with SARS-CoV-2.  Clinical  correlation with patient history and other diagnostic information is  necessary to determine patient infection status.  Positive results do  not rule out bacterial infection or co-infection with other viruses. If result is PRESUMPTIVE POSTIVE SARS-CoV-2 nucleic acids MAY BE PRESENT.   A presumptive positive result was obtained on the submitted specimen  and confirmed on repeat testing.  While 2019 novel coronavirus  (SARS-CoV-2) nucleic acids may be present in the submitted sample  additional confirmatory testing may be necessary for epidemiological  and / or clinical management purposes  to differentiate between  SARS-CoV-2 and other Sarbecovirus currently known to infect humans.  If clinically indicated additional testing with an alternate test  methodology [phone]) is advised. The SARS-CoV-2 RNA is generally  detectable in upper and lower respiratory sp ecimens during the acute  phase of infection. The expected result is Negative. Fact Sheet for Patients:  StrictlyIdeas.no Fact Sheet for Healthcare Providers: BankingDealers.co.za This test is not yet approved or cleared by the [place] FDA and has been authorized for detection and/or diagnosis of SARS-CoV-2 by FDA under an Emergency Use Authorization (EUA).  This EUA will remain in effect (meaning this test can be used) for the duration of the COVID-19 declaration under Section 564(b)(1) of the Act, 21 U.S.C. section 360bbb-3(b)(1), unless the authorization is terminated or revoked sooner. Performed at [facility], [place] [address] [place], [place] [address]   MRSA PCR Screening      Status: None   Collection Time: 02/11/19 11:09 PM   Specimen: Nasopharyngeal  Result Value Ref Range Status   MRSA by PCR NEGATIVE NEGATIVE Final    Comment:        The GeneXpert MRSA Assay (FDA approved for NASAL specimens only), is one component of a comprehensive MRSA colonization surveillance program. It is not intended to diagnose MRSA infection nor to guide or monitor treatment for MRSA infections. Performed at [facility], [place] [address] [place], [place] [address]   Respiratory Panel by PCR     Status: None   Collection Time: 02/12/19  1:36 AM  Result Value Ref Range Status   Adenovirus NOT DETECTED NOT DETECTED Final   Coronavirus 229E NOT DETECTED NOT DETECTED Final    Comment: (NOTE) The Coronavirus on the Respiratory Panel, DOES NOT test for the novel  Coronavirus (2019 nCoV)    Coronavirus HKU1 NOT DETECTED NOT DETECTED Final   Coronavirus NL63 NOT DETECTED NOT DETECTED Final   Coronavirus OC43 NOT DETECTED NOT DETECTED Final   Metapneumovirus NOT DETECTED NOT DETECTED Final   Rhinovirus / Enterovirus NOT DETECTED NOT DETECTED Final   Influenza A NOT DETECTED  NOT DETECTED Final   Influenza B NOT DETECTED NOT DETECTED Final   Parainfluenza Virus 1 NOT DETECTED NOT DETECTED Final   Parainfluenza Virus 2 NOT DETECTED NOT DETECTED Final   Parainfluenza Virus 3 NOT DETECTED NOT DETECTED Final   Parainfluenza Virus 4 NOT DETECTED NOT DETECTED Final   Respiratory Syncytial Virus NOT DETECTED NOT DETECTED Final   Bordetella pertussis NOT DETECTED NOT DETECTED Final   Chlamydophila pneumoniae NOT DETECTED NOT DETECTED Final   Mycoplasma pneumoniae NOT DETECTED NOT DETECTED Final    Comment: Performed at [facility], [place] [address], [place], [place] [address]  Pneumocystis smear by DFA     Status: None   Collection Time: 02/13/19  8:47 AM   Specimen: Sputum; Respiratory  Result Value Ref Range Status   Specimen Source-PJSRC TRACHEAL ASPIRATE  Final    Pneumocystis jiroveci Ag NEGATIVE  Final    Comment: Performed at [facility] Performed at [facility], [place] [address], [place], [place] [address]      Time coordinating discharge: Over 30 minutes  SIGNED:   Desiree Hane, MD  [facility] 02/17/2019, 1:50 PM Pager   If 7PM-7AM, please contact night-coverage [email or web address] Password [email or web address]

## 2019-02-17 NOTE — Progress Notes (Signed)
SATURATION QUALIFICATIONS: (This note is used to comply with regulatory documentation for home oxygen)  Patient Saturations on Room Air at Rest = 92%  Patient Saturations on Room Air while Ambulating = 82%  Patient Saturations on 3Liters of oxygen while Ambulating = 92%  Please briefly explain why patient needs home oxygen: has h/o COPD, NSCLC

## 2019-02-17 NOTE — Progress Notes (Signed)
CSW contacted by RN that patient needs taxi voucher. CSW provided voucher.  Laveda Abbe, [place] Clinical Social Worker [phone]

## 2019-02-21 ENCOUNTER — Inpatient Hospital Stay: Payer: Medicare Other | Admitting: Family Medicine

## 2019-02-24 ENCOUNTER — Ambulatory Visit: Payer: Medicare Other | Admitting: Family Medicine

## 2019-03-01 ENCOUNTER — Inpatient Hospital Stay (HOSPITAL_COMMUNITY)
Admission: EM | Admit: 2019-03-01 | Discharge: 2019-03-10 | DRG: 175 | Disposition: A | Discharge status: Skilled Nursing Facility | Payer: Medicare Other | Attending: Internal Medicine | Admitting: Internal Medicine

## 2019-03-01 ENCOUNTER — Emergency Department (HOSPITAL_COMMUNITY): Payer: Medicare Other

## 2019-03-01 ENCOUNTER — Encounter (HOSPITAL_COMMUNITY): Payer: Self-pay | Admitting: *Deleted

## 2019-03-01 ENCOUNTER — Other Ambulatory Visit: Payer: Self-pay

## 2019-03-01 DIAGNOSIS — F17210 Nicotine dependence, cigarettes, uncomplicated: Secondary | ICD-10-CM | POA: Diagnosis present

## 2019-03-01 DIAGNOSIS — R4182 Altered mental status, unspecified: Secondary | ICD-10-CM

## 2019-03-01 DIAGNOSIS — K760 Fatty (change of) liver, not elsewhere classified: Secondary | ICD-10-CM | POA: Diagnosis present

## 2019-03-01 DIAGNOSIS — W06XXXA Fall from bed, initial encounter: Secondary | ICD-10-CM | POA: Diagnosis present

## 2019-03-01 DIAGNOSIS — I63411 Cerebral infarction due to embolism of right middle cerebral artery: Secondary | ICD-10-CM | POA: Diagnosis not present

## 2019-03-01 DIAGNOSIS — G9341 Metabolic encephalopathy: Secondary | ICD-10-CM | POA: Diagnosis present

## 2019-03-01 DIAGNOSIS — C3432 Malignant neoplasm of lower lobe, left bronchus or lung: Secondary | ICD-10-CM | POA: Diagnosis present

## 2019-03-01 DIAGNOSIS — I63511 Cerebral infarction due to unspecified occlusion or stenosis of right middle cerebral artery: Secondary | ICD-10-CM | POA: Diagnosis not present

## 2019-03-01 DIAGNOSIS — I7410 Embolism and thrombosis of unspecified parts of aorta: Secondary | ICD-10-CM | POA: Diagnosis present

## 2019-03-01 DIAGNOSIS — I2699 Other pulmonary embolism without acute cor pulmonale: Secondary | ICD-10-CM | POA: Diagnosis present

## 2019-03-01 DIAGNOSIS — E876 Hypokalemia: Secondary | ICD-10-CM | POA: Diagnosis present

## 2019-03-01 DIAGNOSIS — I10 Essential (primary) hypertension: Secondary | ICD-10-CM | POA: Diagnosis not present

## 2019-03-01 DIAGNOSIS — J9602 Acute respiratory failure with hypercapnia: Secondary | ICD-10-CM | POA: Diagnosis not present

## 2019-03-01 DIAGNOSIS — I7419 Embolism and thrombosis of other parts of aorta: Secondary | ICD-10-CM | POA: Diagnosis present

## 2019-03-01 DIAGNOSIS — Z515 Encounter for palliative care: Secondary | ICD-10-CM

## 2019-03-01 DIAGNOSIS — J9622 Acute and chronic respiratory failure with hypercapnia: Secondary | ICD-10-CM | POA: Diagnosis present

## 2019-03-01 DIAGNOSIS — G8929 Other chronic pain: Secondary | ICD-10-CM | POA: Diagnosis present

## 2019-03-01 DIAGNOSIS — N183 Chronic kidney disease, stage 3 unspecified: Secondary | ICD-10-CM | POA: Diagnosis present

## 2019-03-01 DIAGNOSIS — I639 Cerebral infarction, unspecified: Secondary | ICD-10-CM | POA: Diagnosis not present

## 2019-03-01 DIAGNOSIS — W19XXXA Unspecified fall, initial encounter: Secondary | ICD-10-CM | POA: Diagnosis not present

## 2019-03-01 DIAGNOSIS — C3412 Malignant neoplasm of upper lobe, left bronchus or lung: Secondary | ICD-10-CM | POA: Diagnosis not present

## 2019-03-01 DIAGNOSIS — J9621 Acute and chronic respiratory failure with hypoxia: Secondary | ICD-10-CM | POA: Diagnosis present

## 2019-03-01 DIAGNOSIS — R29810 Facial weakness: Secondary | ICD-10-CM | POA: Diagnosis present

## 2019-03-01 DIAGNOSIS — B1910 Unspecified viral hepatitis B without hepatic coma: Secondary | ICD-10-CM | POA: Diagnosis present

## 2019-03-01 DIAGNOSIS — Z720 Tobacco use: Secondary | ICD-10-CM | POA: Diagnosis not present

## 2019-03-01 DIAGNOSIS — N280 Ischemia and infarction of kidney: Secondary | ICD-10-CM | POA: Diagnosis present

## 2019-03-01 DIAGNOSIS — I129 Hypertensive chronic kidney disease with stage 1 through stage 4 chronic kidney disease, or unspecified chronic kidney disease: Secondary | ICD-10-CM | POA: Diagnosis present

## 2019-03-01 DIAGNOSIS — E1122 Type 2 diabetes mellitus with diabetic chronic kidney disease: Secondary | ICD-10-CM | POA: Diagnosis present

## 2019-03-01 DIAGNOSIS — Z66 Do not resuscitate: Secondary | ICD-10-CM | POA: Diagnosis present

## 2019-03-01 DIAGNOSIS — J441 Chronic obstructive pulmonary disease with (acute) exacerbation: Secondary | ICD-10-CM | POA: Diagnosis present

## 2019-03-01 DIAGNOSIS — I6389 Other cerebral infarction: Secondary | ICD-10-CM | POA: Diagnosis not present

## 2019-03-01 DIAGNOSIS — Z923 Personal history of irradiation: Secondary | ICD-10-CM

## 2019-03-01 DIAGNOSIS — R079 Chest pain, unspecified: Secondary | ICD-10-CM | POA: Diagnosis present

## 2019-03-01 DIAGNOSIS — R208 Other disturbances of skin sensation: Secondary | ICD-10-CM | POA: Diagnosis present

## 2019-03-01 DIAGNOSIS — C3490 Malignant neoplasm of unspecified part of unspecified bronchus or lung: Secondary | ICD-10-CM | POA: Diagnosis present

## 2019-03-01 DIAGNOSIS — E1142 Type 2 diabetes mellitus with diabetic polyneuropathy: Secondary | ICD-10-CM | POA: Diagnosis present

## 2019-03-01 DIAGNOSIS — M549 Dorsalgia, unspecified: Secondary | ICD-10-CM | POA: Diagnosis not present

## 2019-03-01 DIAGNOSIS — D696 Thrombocytopenia, unspecified: Secondary | ICD-10-CM | POA: Diagnosis present

## 2019-03-01 DIAGNOSIS — I253 Aneurysm of heart: Secondary | ICD-10-CM | POA: Diagnosis present

## 2019-03-01 DIAGNOSIS — G4733 Obstructive sleep apnea (adult) (pediatric): Secondary | ICD-10-CM | POA: Diagnosis present

## 2019-03-01 DIAGNOSIS — Z833 Family history of diabetes mellitus: Secondary | ICD-10-CM

## 2019-03-01 DIAGNOSIS — Z8249 Family history of ischemic heart disease and other diseases of the circulatory system: Secondary | ICD-10-CM

## 2019-03-01 DIAGNOSIS — Z6832 Body mass index (BMI) 32.0-32.9, adult: Secondary | ICD-10-CM

## 2019-03-01 DIAGNOSIS — C3492 Malignant neoplasm of unspecified part of left bronchus or lung: Secondary | ICD-10-CM | POA: Diagnosis not present

## 2019-03-01 DIAGNOSIS — M21372 Foot drop, left foot: Secondary | ICD-10-CM | POA: Diagnosis not present

## 2019-03-01 DIAGNOSIS — Z79899 Other long term (current) drug therapy: Secondary | ICD-10-CM

## 2019-03-01 DIAGNOSIS — J449 Chronic obstructive pulmonary disease, unspecified: Secondary | ICD-10-CM | POA: Diagnosis present

## 2019-03-01 DIAGNOSIS — Z800 Family history of malignant neoplasm of digestive organs: Secondary | ICD-10-CM

## 2019-03-01 DIAGNOSIS — Z7982 Long term (current) use of aspirin: Secondary | ICD-10-CM

## 2019-03-01 DIAGNOSIS — D735 Infarction of spleen: Secondary | ICD-10-CM | POA: Diagnosis present

## 2019-03-01 DIAGNOSIS — G709 Myoneural disorder, unspecified: Secondary | ICD-10-CM | POA: Diagnosis present

## 2019-03-01 DIAGNOSIS — G9340 Encephalopathy, unspecified: Secondary | ICD-10-CM | POA: Diagnosis not present

## 2019-03-01 DIAGNOSIS — E785 Hyperlipidemia, unspecified: Secondary | ICD-10-CM | POA: Diagnosis present

## 2019-03-01 DIAGNOSIS — Z20828 Contact with and (suspected) exposure to other viral communicable diseases: Secondary | ICD-10-CM | POA: Diagnosis present

## 2019-03-01 DIAGNOSIS — G939 Disorder of brain, unspecified: Secondary | ICD-10-CM | POA: Diagnosis not present

## 2019-03-01 DIAGNOSIS — R29726 NIHSS score 26: Secondary | ICD-10-CM | POA: Diagnosis not present

## 2019-03-01 DIAGNOSIS — E669 Obesity, unspecified: Secondary | ICD-10-CM | POA: Diagnosis present

## 2019-03-01 DIAGNOSIS — H548 Legal blindness, as defined in USA: Secondary | ICD-10-CM | POA: Diagnosis present

## 2019-03-01 DIAGNOSIS — J9601 Acute respiratory failure with hypoxia: Secondary | ICD-10-CM | POA: Diagnosis not present

## 2019-03-01 DIAGNOSIS — Z21 Asymptomatic human immunodeficiency virus [HIV] infection status: Secondary | ICD-10-CM | POA: Diagnosis present

## 2019-03-01 DIAGNOSIS — B20 Human immunodeficiency virus [HIV] disease: Secondary | ICD-10-CM | POA: Diagnosis present

## 2019-03-01 DIAGNOSIS — Y92009 Unspecified place in unspecified non-institutional (private) residence as the place of occurrence of the external cause: Secondary | ICD-10-CM | POA: Diagnosis not present

## 2019-03-01 DIAGNOSIS — R531 Weakness: Secondary | ICD-10-CM | POA: Diagnosis not present

## 2019-03-01 DIAGNOSIS — Z9981 Dependence on supplemental oxygen: Secondary | ICD-10-CM

## 2019-03-01 DIAGNOSIS — I959 Hypotension, unspecified: Secondary | ICD-10-CM | POA: Diagnosis not present

## 2019-03-01 LAB — CBC WITH DIFFERENTIAL/PLATELET
Abs Immature Granulocytes: 0 10*3/uL (ref 0.00–0.07)
Basophils Absolute: 0 10*3/uL (ref 0.0–0.1)
Basophils Relative: 0 %
Eosinophils Absolute: 0.1 10*3/uL (ref 0.0–0.5)
Eosinophils Relative: 1 %
HCT: 55 % — ABNORMAL HIGH (ref 36.0–46.0)
Hemoglobin: 15.1 g/dL — ABNORMAL HIGH (ref 12.0–15.0)
Lymphocytes Relative: 26 %
Lymphs Abs: 3 10*3/uL (ref 0.7–4.0)
MCH: 23.7 pg — ABNORMAL LOW (ref 26.0–34.0)
MCHC: 27.5 g/dL — ABNORMAL LOW (ref 30.0–36.0)
MCV: 86.2 fL (ref 80.0–100.0)
Monocytes Absolute: 0.5 10*3/uL (ref 0.1–1.0)
Monocytes Relative: 4 %
Neutro Abs: 8 10*3/uL — ABNORMAL HIGH (ref 1.7–7.7)
Neutrophils Relative %: 69 %
Platelets: 149 10*3/uL — ABNORMAL LOW (ref 150–400)
RBC: 6.38 MIL/uL — ABNORMAL HIGH (ref 3.87–5.11)
RDW: 24.6 % — ABNORMAL HIGH (ref 11.5–15.5)
WBC: 11.6 10*3/uL — ABNORMAL HIGH (ref 4.0–10.5)
nRBC: 0.9 % — ABNORMAL HIGH (ref 0.0–0.2)
nRBC: 1 /100 WBC — ABNORMAL HIGH

## 2019-03-01 LAB — COMPREHENSIVE METABOLIC PANEL
ALT: 25 U/L (ref 0–44)
AST: 73 U/L — ABNORMAL HIGH (ref 15–41)
Albumin: 3.2 g/dL — ABNORMAL LOW (ref 3.5–5.0)
Alkaline Phosphatase: 66 U/L (ref 38–126)
Anion gap: 15 (ref 5–15)
BUN: 13 mg/dL (ref 8–23)
CO2: 23 mmol/L (ref 22–32)
Calcium: 8.6 mg/dL — ABNORMAL LOW (ref 8.9–10.3)
Chloride: 102 mmol/L (ref 98–111)
Creatinine, Ser: 1.31 mg/dL — ABNORMAL HIGH (ref 0.44–1.00)
GFR calc Af Amer: 47 mL/min — ABNORMAL LOW (ref >60)
GFR calc non Af Amer: 41 mL/min — ABNORMAL LOW (ref >60)
Glucose, Bld: 89 mg/dL (ref 70–99)
Potassium: 5 mmol/L (ref 3.5–5.1)
Sodium: 140 mmol/L (ref 135–145)
Total Bilirubin: 2 mg/dL — ABNORMAL HIGH (ref 0.3–1.2)
Total Protein: 7.6 g/dL (ref 6.5–8.1)

## 2019-03-01 LAB — URINALYSIS, ROUTINE W REFLEX MICROSCOPIC
Bilirubin Urine: NEGATIVE
Glucose, UA: NEGATIVE mg/dL
Hgb urine dipstick: NEGATIVE
Ketones, ur: 80 mg/dL — AB
Leukocytes,Ua: NEGATIVE
Nitrite: NEGATIVE
Protein, ur: 100 mg/dL — AB
Specific Gravity, Urine: 1.023 (ref 1.005–1.030)
pH: 5 (ref 5.0–8.0)

## 2019-03-01 LAB — ANTITHROMBIN III: AntiThromb III Func: 96 % (ref 75–120)

## 2019-03-01 LAB — CK: Total CK: 382 U/L — ABNORMAL HIGH (ref 38–234)

## 2019-03-01 MED ORDER — HEPARIN BOLUS VIA INFUSION
6000.0000 [IU] | Freq: Once | INTRAVENOUS | Status: AC
  Administered 2019-03-01: 6000 [IU] via INTRAVENOUS
  Filled 2019-03-01: qty 6000

## 2019-03-01 MED ORDER — VITAMIN B50 COMPLEX PO TBCR: EXTENDED_RELEASE_TABLET | Freq: Every day | ORAL | Status: DC

## 2019-03-01 MED ORDER — ASPIRIN EC 81 MG PO TBEC
81.0000 mg | DELAYED_RELEASE_TABLET | Freq: Every day | ORAL | Status: DC
  Filled 2019-03-01: qty 1

## 2019-03-01 MED ORDER — OMEGA-3-ACID ETHYL ESTERS 1 G PO CAPS
1.0000 g | ORAL_CAPSULE | Freq: Every day | ORAL | Status: DC
  Administered 2019-03-02 – 2019-03-10 (×7): 1 g via ORAL
  Filled 2019-03-01 (×7): qty 1

## 2019-03-01 MED ORDER — OXYCODONE HCL 5 MG PO TABS
5.0000 mg | ORAL_TABLET | Freq: Four times a day (QID) | ORAL | Status: DC | PRN
  Administered 2019-03-01 – 2019-03-02 (×3): 5 mg via ORAL
  Filled 2019-03-01 (×3): qty 1

## 2019-03-01 MED ORDER — SODIUM CHLORIDE 0.9 % IV BOLUS
1000.0000 mL | Freq: Once | INTRAVENOUS | Status: AC
  Administered 2019-03-01: 1000 mL via INTRAVENOUS

## 2019-03-01 MED ORDER — GABAPENTIN 600 MG PO TABS: 600.0000 mg | ORAL_TABLET | Freq: Once | ORAL | Status: DC

## 2019-03-01 MED ORDER — VITAMIN A 3 MG (10000 UNIT) PO CAPS: 10000.0000 [IU] | ORAL_CAPSULE | Freq: Every day | ORAL | Status: DC

## 2019-03-01 MED ORDER — HEPARIN (PORCINE) 25000 UT/250ML-% IV SOLN
1700.0000 [IU]/h | INTRAVENOUS | Status: DC
  Administered 2019-03-01: 1550 [IU]/h via INTRAVENOUS
  Filled 2019-03-01 (×2): qty 250

## 2019-03-01 MED ORDER — ALBUTEROL SULFATE (2.5 MG/3ML) 0.083% IN NEBU
3.0000 mL | INHALATION_SOLUTION | Freq: Four times a day (QID) | RESPIRATORY_TRACT | Status: DC | PRN
  Administered 2019-03-03: 3 mL via RESPIRATORY_TRACT
  Filled 2019-03-01: qty 3

## 2019-03-01 MED ORDER — IOHEXOL 300 MG/ML  SOLN
80.0000 mL | Freq: Once | INTRAMUSCULAR | Status: AC | PRN
  Administered 2019-03-01: 100 mL via INTRAVENOUS

## 2019-03-01 MED ORDER — MOMETASONE FURO-FORMOTEROL FUM 200-5 MCG/ACT IN AERO
2.0000 | INHALATION_SPRAY | Freq: Two times a day (BID) | RESPIRATORY_TRACT | Status: DC
  Administered 2019-03-06 – 2019-03-10 (×7): 2 via RESPIRATORY_TRACT
  Filled 2019-03-01 (×2): qty 8.8

## 2019-03-01 MED ORDER — LORATADINE 10 MG PO TABS
10.0000 mg | ORAL_TABLET | Freq: Two times a day (BID) | ORAL | Status: DC
  Administered 2019-03-02 (×2): 10 mg via ORAL
  Filled 2019-03-01 (×2): qty 1

## 2019-03-01 MED ORDER — MONTELUKAST SODIUM 10 MG PO TABS
10.0000 mg | ORAL_TABLET | Freq: Every day | ORAL | Status: DC
  Administered 2019-03-02 (×2): 10 mg via ORAL
  Filled 2019-03-01 (×3): qty 1

## 2019-03-01 MED ORDER — VENLAFAXINE HCL ER 75 MG PO CP24
150.0000 mg | ORAL_CAPSULE | Freq: Every day | ORAL | Status: DC
  Administered 2019-03-02: 150 mg via ORAL
  Filled 2019-03-01: qty 1

## 2019-03-01 MED ORDER — VITAMIN C 250 MG PO TABS
125.0000 mg | ORAL_TABLET | Freq: Every day | ORAL | Status: DC
  Administered 2019-03-02: 125 mg via ORAL
  Filled 2019-03-01 (×2): qty 1

## 2019-03-01 MED ORDER — GABAPENTIN 300 MG PO CAPS: 600.0000 mg | ORAL_CAPSULE | Freq: Three times a day (TID) | ORAL | Status: DC

## 2019-03-01 MED ORDER — GABAPENTIN 300 MG PO CAPS
600.0000 mg | ORAL_CAPSULE | Freq: Once | ORAL | Status: AC
  Administered 2019-03-01: 600 mg via ORAL
  Filled 2019-03-01: qty 2

## 2019-03-01 MED ORDER — CALCIUM CARBONATE-VITAMIN D 600-400 MG-UNIT PO TABS: 1.0000 | ORAL_TABLET | Freq: Every day | ORAL | Status: DC

## 2019-03-01 MED ORDER — EFAVIRENZ-EMTRICITAB-TENOFOVIR 600-200-300 MG PO TABS
1.0000 | ORAL_TABLET | Freq: Every day | ORAL | Status: DC
  Administered 2019-03-02 (×2): 1 via ORAL
  Filled 2019-03-01 (×3): qty 1

## 2019-03-01 MED ORDER — TIZANIDINE HCL 4 MG PO TABS
4.0000 mg | ORAL_TABLET | Freq: Two times a day (BID) | ORAL | Status: DC
  Administered 2019-03-02 (×2): 4 mg via ORAL
  Filled 2019-03-01 (×2): qty 1

## 2019-03-01 MED ORDER — UMECLIDINIUM BROMIDE 62.5 MCG/INH IN AEPB
2.0000 | INHALATION_SPRAY | Freq: Every day | RESPIRATORY_TRACT | Status: DC
  Administered 2019-03-08 – 2019-03-10 (×3): 2 via RESPIRATORY_TRACT
  Filled 2019-03-01: qty 7

## 2019-03-01 MED ORDER — MORPHINE SULFATE (PF) 2 MG/ML IV SOLN
1.0000 mg | INTRAVENOUS | Status: DC | PRN
  Administered 2019-03-02 (×4): 1 mg via INTRAVENOUS
  Filled 2019-03-01 (×4): qty 1

## 2019-03-01 MED ORDER — FENTANYL CITRATE (PF) 100 MCG/2ML IJ SOLN
50.0000 ug | Freq: Once | INTRAMUSCULAR | Status: AC
  Administered 2019-03-01: 50 ug via INTRAVENOUS
  Filled 2019-03-01: qty 2

## 2019-03-01 NOTE — ED Triage Notes (Signed)
PT fell out of bed at approx. 0600 . Pt lives at home and could not call 911. Pt reports later she was able to call brother and he called 46. Pt had to roll around on the floor . Pt denies any LOC and any injury to head. Pt reports legs were weak and could not stand up.

## 2019-03-01 NOTE — ED Notes (Signed)
Patient transported to X-ray 

## 2019-03-01 NOTE — ED Provider Notes (Signed)
Medical screening examination/treatment/procedure(s) were conducted as a shared visit with non-physician practitioner(s) and myself.  I personally evaluated the patient during the encounter.  EKG Interpretation  Date/Time:  Wednesday March 01 2019 13:52:21 EDT Ventricular Rate:  90 PR Interval:    QRS Duration: 79 QT Interval:  379 QTC Calculation: 464 R Axis:   37 Text Interpretation:  Sinus rhythm Biatrial enlargement Probable left ventricular hypertrophy Abnormal T, consider ischemia, anterior leads No significant change since last tracing Confirmed by Lacretia Leigh ([id number]) on 03/01/2019 3:59:66 PM 73 year old female here after running out of her bed just prior to arrival.  Complaining of bilateral hip pain.  X-rays were negative.  She has full range of motion at her hips.  No shortening or rotation.  Will check labs and likely discharge home   Lacretia Leigh, MD 03/01/19 [phone]

## 2019-03-01 NOTE — Progress Notes (Signed)
[place] for heparin Indication: pulmonary embolus  Heparin Dosing Weight: 88.1 kg  Labs: Recent Labs    03/01/19 1330  HGB 15.1*  HCT 55.0*  PLT 149*  CREATININE 1.31*  CKTOTAL 382*    Assessment: 39 yof presenting s/p fall (slid to floor from bed), hip pain. 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 Pharmacy consulted to dose heparin for PE. No anticoagulants PTA. Hg 15.1, plt 149. Noted remote hx of rectal bleed, but no active bleed issues documented.  Goal of Therapy:  Heparin level 0.3-0.7 units/ml Monitor platelets by anticoagulation protocol: Yes   Plan:  Heparin 6000 unit bolus Start heparin at 1550 units/h 6h heparin level Daily heparin level/CBC Monitor s/sx bleeding  Elicia Lamp, PharmD, BCPS Clinical Pharmacist 03/01/2019 8:31 PM

## 2019-03-01 NOTE — ED Notes (Signed)
Pt wants food  Has to wait for her xray results

## 2019-03-01 NOTE — ED Provider Notes (Signed)
[facility] EMERGENCY DEPARTMENT Provider Note   CSN: [id number] Arrival date & time: 03/01/19  1246     History   Chief Complaint Chief Complaint  Patient presents with  . Fall  . Hip Pain    HPI [name] is a 73 y.o. 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  States that she was sitting in her bed when she felt like her feet were going numb which usually happens with her neuropathy.  States that she then slid onto the floor, laid there until her son found her approximately 6 hours later.  She states that she was too weak in her bilateral legs and was unable to ambulate secondary to pain in her back as well.  She denies any head injury or loss of consciousness.  She denies numbness in arms or legs, headache, vision changes, vomiting, diarrhea, prior hip fractures, chest pain.     HPI  Past Medical History:  Diagnosis Date  . Asthma   . Atypical squamous cells of undetermined significance (ASCUS) on Papanicolaou smear of cervix   . COPD (chronic obstructive pulmonary disease) ([place])   . H/O drainage of abscess    left axilla, from left breast  . Hep B w/o coma   . HIV (human immunodeficiency virus infection) ([place])   . Hyperlipidemia   . Lung cancer ([place]) 12/14/13   LUL Adenocarcinoma  . Neuromuscular disorder ([place])    fingers/feet neuropathy  . Non-small cell lung cancer ([place])   . S/P radiation therapy 01/26/14-02/02/14   sbrt  lt upper lung-54Gy/62fx    Patient Active Problem List   Diagnosis Date Noted  . Acute and chronic respiratory failure (acute-on-chronic) ([place]) 02/11/2019  . Sepsis ([place]) 03/28/2015  . CAP (community acquired pneumonia) 03/28/2015  . UTI (lower urinary tract infection) 03/28/2015  . Respiratory failure ([place]) 03/28/2015  . Non-small cell lung  cancer ([place])   . Malignant neoplasm of upper lobe, bronchus or lung 01/03/2014  . Lung cancer ([place]) 11/08/2013  . COPD with acute exacerbation ([place]) 10/30/2013  . COPD exacerbation ([place]) 10/30/2013  . Other and unspecified noninfectious gastroenteritis and colitis(558.9) 11/20/2012  . Rectal bleed 11/19/2012  . Obesity 05/05/2012  . Renal insufficiency 05/05/2012  . Hyperglycemia 05/05/2012  . Asthma 12/10/2011  . Cigarette smoker 12/10/2011  . Hyperlipidemia 12/11/2010  . Hypertension 12/11/2010  . ALLERGIC RHINITIS, SEASONAL 07/03/2010  . COPD 07/03/2010  . Human immunodeficiency virus (HIV) disease ([place]) 12/09/2006  . DEPRESSION 12/09/2006  . BREAST MASS, BENIGN 12/09/2006  . OSTEOPOROSIS 12/09/2006    Past Surgical History:  Procedure Laterality Date  . ABCESS DRAINAGE     abcess under Left arm, abcess from L breast  . removal of abnormal cells on uterus    . thyroid gland removed       OB History   No obstetric history on file.      Home Medications    Prior to Admission medications   Medication Sig Start Date End Date Taking? Authorizing Provider  albuterol (PROVENTIL HFA;VENTOLIN HFA) 108 (90 BASE) MCG/ACT inhaler Inhale 1 puff into the lungs every 6 (six) hours as needed for shortness of breath.     [provider]  albuterol (PROVENTIL) (2.5 MG/3ML) 0.083% nebulizer solution Take 3 mLs (2.5 mg total) by nebulization every 4 (four)  hours as needed for wheezing or shortness of breath. 01/18/19   Jaynee Eagles, PA-C  Ascorbic Acid (VITAMIN C) 100 MG tablet Take 100 mg by mouth daily.    [provider]  aspirin EC 81 MG tablet Take 81 mg by mouth daily.    [provider]  ATRIPLA 600-200-300 MG tablet TAKE 1 TABLET BY MOUTH EVERY NIGHT AT BEDTIME 09/03/15   Tommy Medal, Lavell Islam, MD  B Complex-Biotin-FA (VITAMIN B50 COMPLEX PO) Take 1 tablet by mouth daily.    [provider]  Calcium Carbonate-Vitamin D (CALCIUM 600+D) 600-400  MG-UNIT per tablet Take 1 tablet by mouth daily.    [provider]  Fluticasone-Salmeterol (ADVAIR) 500-50 MCG/DOSE AEPB Inhale 1 puff into the lungs every 12 (twelve) hours.    [provider]  gabapentin (NEURONTIN) 300 MG capsule TAKE 2 CAPSULES BY MOUTH THREE TIMES A DAY Patient taking differently: Take 600 mg by mouth 3 (three) times daily.  01/14/15   Truman Hayward, MD  ibuprofen (ADVIL,MOTRIN) 600 MG tablet Take 1 tablet (600 mg total) by mouth every 8 (eight) hours as needed. Patient taking differently: Take 600 mg by mouth every 8 (eight) hours as needed for moderate pain.  12/14/14   Jola Schmidt, MD  loratadine (CLARITIN) 10 MG tablet Take 10 mg by mouth daily.    [provider]  methocarbamol (ROBAXIN) 500 MG tablet Take 1 tablet (500 mg total) by mouth every 6 (six) hours as needed for muscle spasms. 09/15/14   [name], MD  montelukast (SINGULAIR) 10 MG tablet Take 1 tablet (10 mg total) by mouth at bedtime. 12/02/12   Michel Bickers, MD  omega-3 acid ethyl esters (LOVAZA) 1 g capsule Take 1 g by mouth daily.    [provider]  predniSONE (DELTASONE) 20 MG tablet Take 2 tablets (40 mg total) by mouth daily with breakfast. 02/17/19   Desiree Hane, MD  SPIRIVA RESPIMAT 2.5 MCG/ACT AERS INHALE 2 PUFFS INTO THE LUNGS DAILY Patient taking differently: Inhale 2 puffs into the lungs daily.  09/24/14   Rigoberto Noel, MD  tiZANidine (ZANAFLEX) 4 MG capsule Take 4 mg by mouth 2 (two) times daily.    [provider]  venlafaxine XR (EFFEXOR-XR) 150 MG 24 hr capsule Take 150 mg by mouth daily with breakfast.     [provider]  VITAMIN A PO Take 1 tablet by mouth daily.    [provider]    Family History Family History  Problem Relation Age of Onset  . Pneumonia Mother   . Hypertension Sister   . Diabetes Sister   . Cancer Sister        pancreatic and lung  . Cancer Brother        pancreatic  . Cancer Brother         pancreatic    Social History Social History   Tobacco Use  . Smoking status: Current Every Day Smoker    Packs/day: 1.00    Years: 40.00    Pack years: 40.00    Types: Cigarettes  . Smokeless tobacco: Never Used  . Tobacco comment: quit date the day she starts her radiation at the [facility]  Substance Use Topics  . Alcohol use: No    Alcohol/week: 0.0 standard drinks    Comment: none in 20 years  . Drug use: No    Comment: none in 20 years     Allergies   Patient has  no known allergies.   Review of Systems Review of Systems  Constitutional: Negative for appetite change, chills and fever.  HENT: Negative for ear pain, rhinorrhea, sneezing and sore throat.   Eyes: Negative for photophobia and visual disturbance.  Respiratory: Negative for cough, chest tightness, shortness of breath and wheezing.   Cardiovascular: Negative for chest pain and palpitations.  Gastrointestinal: Negative for abdominal pain, blood in stool, constipation, diarrhea, nausea and vomiting.  Genitourinary: Negative for dysuria, hematuria and urgency.  Musculoskeletal: Positive for arthralgias, back pain and myalgias.  Skin: Negative for rash.  Neurological: Negative for dizziness, weakness and light-headedness.     Physical Exam Updated Vital Signs BP 108/83   Pulse 92   Temp 99.8 F (37.7 C) (Oral)   Resp 16   SpO2 94%   Physical Exam Vitals signs and nursing note reviewed.  Constitutional:      General: She is not in acute distress.    Appearance: She is well-developed. She is obese.     Comments: Speaking in complete sentences without difficulty.  3 L of oxygen being delivered by nasal cannula.  HENT:     Head: Normocephalic and atraumatic.     Nose: Nose normal.  Eyes:     General: No scleral icterus.       Right eye: No discharge.        Left eye: No discharge.     Conjunctiva/sclera: Conjunctivae normal.     Pupils: Pupils are equal, round, and reactive to light.   Neck:     Musculoskeletal: Normal range of motion and neck supple.  Cardiovascular:     Rate and Rhythm: Normal rate and regular rhythm.     Heart sounds: Normal heart sounds. No murmur. No friction rub. No gallop.   Pulmonary:     Effort: Pulmonary effort is normal. No respiratory distress.     Breath sounds: Normal breath sounds.  Chest:     Chest wall: Tenderness present.    Abdominal:     General: Bowel sounds are normal. There is no distension.     Palpations: Abdomen is soft.     Tenderness: There is no abdominal tenderness. There is no guarding.  Musculoskeletal: Normal range of motion.        General: Tenderness present.       Back:       Legs:     Comments: Tenderness to palpation of bilateral hips with no changes to range of motion noted.  No deformity, shortening noted.  2+ DP pulse palpated bilateral lower extremities. Strength 5/5 in bilateral lower extremities.  Skin:    General: Skin is warm and dry.     Findings: No rash.  Neurological:     General: No focal deficit present.     Mental Status: She is alert and oriented to person, place, and time.     Cranial Nerves: No cranial nerve deficit.     Sensory: No sensory deficit.     Motor: No weakness or abnormal muscle tone.     Coordination: Coordination normal.     Comments: Pupils reactive. No facial asymmetry noted. Cranial nerves appear grossly intact. Sensation intact to light touch on face, BUE and BLE. Strength 5/5 in BUE and BLE.       ED Treatments / Results  Labs (all labs ordered are listed, but only abnormal results are displayed) Labs Reviewed  COMPREHENSIVE METABOLIC PANEL - Abnormal; Notable for the following components:      Result  Value   Creatinine, Ser 1.31 (*)    Calcium 8.6 (*)    Albumin 3.2 (*)    AST 73 (*)    Total Bilirubin 2.0 (*)    GFR calc non Af Amer 41 (*)    GFR calc Af Amer 47 (*)    All other components within normal limits  CBC WITH DIFFERENTIAL/PLATELET - Abnormal;  Notable for the following components:   WBC 11.6 (*)    RBC 6.38 (*)    Hemoglobin 15.1 (*)    HCT 55.0 (*)    MCH 23.7 (*)    MCHC 27.5 (*)    RDW 24.6 (*)    Platelets 149 (*)    nRBC 0.9 (*)    Neutro Abs 8.0 (*)    nRBC 1 (*)    All other components within normal limits  CK - Abnormal; Notable for the following components:   Total CK 382 (*)    All other components within normal limits  URINE CULTURE  URINALYSIS, ROUTINE W REFLEX MICROSCOPIC    EKG EKG Interpretation  Date/Time:  Wednesday March 01 2019 13:52:21 EDT Ventricular Rate:  90 PR Interval:    QRS Duration: 79 QT Interval:  379 QTC Calculation: 464 R Axis:   37 Text Interpretation:  Sinus rhythm Biatrial enlargement Probable left ventricular hypertrophy Abnormal T, consider ischemia, anterior leads No significant change since last tracing Confirmed by Lacretia Leigh ([id number]) on 03/01/2019 3:42:59 PM   Radiology Dg Chest 2 View  Result Date: 03/01/2019 CLINICAL DATA:  Anterior chest pain post fall. EXAM: CHEST - 2 VIEW COMPARISON:  February 14, 2019 FINDINGS: Cardiomediastinal silhouette is normal. Mediastinal contours appear intact. Tortuosity of the aorta. There is no evidence of pleural effusion or pneumothorax. Linear opacities in the left mid thorax, possibly posttreatment or postsurgical changes. No radiographic evidence of displaced rib fractures. Soft tissues are grossly normal. IMPRESSION: 1. Linear opacities in the left mid thorax, possibly posttreatment or postsurgical changes. 2. No radiographic evidence of displaced rib fractures. Electronically Signed   By: Fidela Salisbury M.D.   On: 03/01/2019 14:48   Dg Hips Bilat W Or Wo Pelvis 3-4 Views  Result Date: 03/01/2019 CLINICAL DATA:  Bilateral hip pain after fall today. EXAM: DG HIP (WITH OR WITHOUT PELVIS) 3-4V BILAT COMPARISON:  None. FINDINGS: There is no evidence of hip fracture or dislocation. There is no evidence of arthropathy or other focal  bone abnormality. IMPRESSION: Negative. Electronically Signed   By: Marijo Conception M.D.   On: 03/01/2019 14:45    Procedures Procedures (including critical care time)  Medications Ordered in ED Medications  gabapentin (NEURONTIN) capsule 600 mg (has no administration in time range)  fentaNYL (SUBLIMAZE) injection 50 mcg (50 mcg Intravenous Given 03/01/19 1548)  sodium chloride 0.9 % bolus 1,000 mL (1,000 mLs Intravenous New Bag/Given 03/01/19 1547)     Initial Impression / Assessment and Plan / ED Course  I have reviewed the triage vital signs and the nursing notes.  Pertinent labs & imaging results that were available during my care of the patient were reviewed by me and considered in my medical decision making (see chart for details).  Clinical Course as of Mar 01 1615  Wed Mar 01, 2019  1545 CK Total(!): 382 [HK]  1545 Higher than baseline at 1.  Creatinine(!): 1.31 [HK]    Clinical Course User Index [HK] Delia Heady, PA-C       73 year old female with past medical history of  HIV, adenocarcinoma left upper lobe status post radiation therapy presents to ED for bilateral hip pain, leg pain, neuropathy and right-sided lower rib pain after fall that occurred earlier this morning.  States that she was sitting on her bed when she felt like her feet were going numb due to her neuropathy.  She slid onto the floor, rolled around and laid on the ground until her son found her about 6 hours later.  She denies head injury or loss of consciousness.  She reports pain in her lower back rating down to her legs.  She has not tried ambulating since the incident occurred.  She denies any anticoagulant use.  On my exam patient has no deficits neurological exam noted.  No midline tenderness of C, T or L-spine. TTP of R rib area.  Full active and passive range of motion of bilateral hips.  Lower extremities are neurovascularly intact.  No wounds noted.  Lab work significant for elevated hgb, the elevation  in creatinine to 1.3, slight elevation in CK to 382 all consistent with dehydration.  Chest x-ray is unremarkable, no rib fractures noted.  Hip and pelvis x-ray is unremarkable.  Awaiting CT of the chest and abdomen to evaluate for rib fractures due to her pain and location, history of trauma.  Awaiting urinalysis as well.  If imaging and urinalysis unremarkable, will discharge home with PCP follow-up. Care handed off to oncoming provider pending remainder of workup.   Final Clinical Impressions(s) / ED Diagnoses   Final diagnoses:  Fall in home, initial encounter    ED Discharge Orders    None       Delia Heady, [place] 03/01/19 1644    Lacretia Leigh, MD 03/02/19 1230

## 2019-03-01 NOTE — ED Notes (Signed)
The pt wants something else for pain  Admitting doctor paged

## 2019-03-01 NOTE — TOC Initial Note (Signed)
Transition of Care [facility]) - Initial/Assessment Note    Patient Details  Name: [name] MRN: [id number] Date of Birth: [birth year]  Transition of Care [facility]) CM/SW Contact:    Marceil Welp Dimitri Ped, LCSW Phone Number: 03/01/2019, 10:17 PM  Clinical Narrative:    CSW at bedside to conduct TOC assessment. Pt reports that she lives at home alone and that her closest family resides in [place], [place]. Pt reports that she lives at home alone and that her neighbor checks in on her occasionally. Pt explains that she has minimal support in the home along with minimal services.   When asked how she gets back and forth to medical appointments, Pt explains that she uses [facility]. Pt is currently on 3L of 02 in the home and uses a Cpap machine.   Pt has a hx with [facility]. Pt has a walker and shower chair to assist with ambulation and bathing.   TOC team will continue to follow pt for any discharge needs.   [facility] Transitions of Care  Clinical Social Worker  Ph: [phone]             Expected Discharge Plan: [place] Barriers to Discharge: Continued Medical Work up   Patient Goals and CMS Choice Patient states their goals for this hospitalization and ongoing recovery are:: to return home      Expected Discharge Plan and Services Expected Discharge Plan: [place] In-house Referral: Clinical Social Work     Living arrangements for the past 2 months: Single Family Home                                      Prior Living Arrangements/Services Living arrangements for the past 2 months: Single Family Home Lives with:: Self Patient language and need for interpreter reviewed:: Yes Do you feel safe going back to the place where you live?: Yes      Need for Family Participation in Patient Care: No (Comment) Care giver support system in place?: No (comment) Current home services:  DME Criminal Activity/Legal Involvement Pertinent to Current Situation/Hospitalization: No - Comment as needed  Activities of Daily Living      Permission Sought/Granted Permission sought to share information with : Case Manager                Emotional Assessment Appearance:: Appears stated age Attitude/Demeanor/Rapport: Engaged, Gracious Affect (typically observed): Stable Orientation: : Oriented to Self, Oriented to Place, Oriented to  Time, Oriented to Situation Alcohol / Substance Use: Never Used Psych Involvement: No (comment)  Admission diagnosis:  Back hip pain; Fall Patient Active Problem List   Diagnosis Date Noted  . COPD (chronic obstructive pulmonary disease) ([place]) 03/01/2019  . PE (pulmonary thromboembolism) ([place]) 03/01/2019  . Tobacco abuse 03/01/2019  . 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 CKD (chronic kidney disease), stage III ([place]) 03/01/2019  . Fall at home   . 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 Splenic infarct   . Acute and chronic respiratory failure (acute-on-chronic) ([place]) 02/11/2019  . Sepsis ([place]) 03/28/2015  . CAP (community acquired pneumonia) 03/28/2015  . UTI (lower urinary tract infection) 03/28/2015  . Respiratory failure ([place]) 03/28/2015  . Non-small cell lung cancer ([place])   . Malignant neoplasm of upper lobe, bronchus or lung 01/03/2014  . Lung cancer ([place]) 11/08/2013  . COPD  with acute exacerbation ([place]) 10/30/2013  . COPD exacerbation ([place]) 10/30/2013  . Other and unspecified noninfectious gastroenteritis and colitis(558.9) 11/20/2012  . Rectal bleed 11/19/2012  . Obesity 05/05/2012  . Renal insufficiency 05/05/2012  . Hyperglycemia 05/05/2012  . Asthma 12/10/2011  . Cigarette smoker 12/10/2011  . Hyperlipidemia 12/11/2010  . Hypertension 12/11/2010  . ALLERGIC RHINITIS, SEASONAL 07/03/2010  . COPD 07/03/2010  . Human immunodeficiency virus (HIV) disease ([place]) 12/09/2006  . DEPRESSION 12/09/2006  . BREAST MASS, BENIGN 12/09/2006  .  OSTEOPOROSIS 12/09/2006   PCP:  System, Provider Not In Pharmacy:   [place], [place] [address] [place] [address] [address] Phone: [phone] Fax: [place], [place] [address] Phone: [phone] Fax: [phone]  [place], [place] [address] Phone: [phone] Fax: [phone]     Social Determinants of Health (SDOH) Interventions    Readmission Risk Interventions No flowsheet data found.

## 2019-03-01 NOTE — Care Plan (Signed)
Patient with multiple medical problems including adenocarcinoma of the lung. Suffered mechanical fall out of bed with work-up in the ED including CT C/A/P that showed arterial and venous thrombosis (PE and aortic arch thrombus with renal and splenic infarcts.   Interestingly she just had an echo from 2 weeks ago that showed normal LV systolic function, size, and wall motion. There was no shunt by color doppler. There was evidence of pulmonary hypertension with moderately reduced RV systolic function, possibly sequelae of her PE. From a cardiac standpoint, there this makes cardiac source of thromboembolism very unlikely. 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 Consider discussing thrombophilia work-up with hematology and anticoagulation, though I suspect this may be related to her malignancy.

## 2019-03-01 NOTE — ED Notes (Signed)
URINE COLLECTED  [place]

## 2019-03-01 NOTE — ED Notes (Signed)
TO CT

## 2019-03-01 NOTE — ED Notes (Signed)
Pt asking for pain med

## 2019-03-01 NOTE — ED Provider Notes (Signed)
Assumed care from [facility], PA-C at shift change pending inaging and UA. See her note for full H&P.   Per her note, "JAYDEN RUDGE is a 73 y.o. 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  States that she was sitting in her bed when she felt like her feet were going numb which usually happens with her neuropathy.  States that she then slid onto the floor, laid there until her son found her approximately 6 hours later.  She states that she was too weak in her bilateral legs and was unable to ambulate secondary to pain in her back as well.  She denies any head injury or loss of consciousness.  She denies numbness in arms or legs, headache, vision changes, vomiting, diarrhea, prior hip fractures, chest pain."  Physical Exam  BP 125/70   Pulse 92   Temp 99.8 F (37.7 C) (Oral)   Resp (!) 25   SpO2 98%   Physical Exam Vitals signs and nursing note reviewed.  Constitutional:      General: She is not in acute distress.    Appearance: She is well-developed.  HENT:     Head: Normocephalic and atraumatic.  Eyes:     Conjunctiva/sclera: Conjunctivae normal.  Neck:     Musculoskeletal: Neck supple.  Cardiovascular:     Rate and Rhythm: Normal rate.     Heart sounds: No murmur.  Pulmonary:     Effort: Pulmonary effort is normal. No respiratory distress.  Abdominal:     Palpations: Abdomen is soft.     Tenderness: There is no abdominal tenderness.  Skin:    General: Skin is warm and dry.  Neurological:     Mental Status: She is alert.     Comments: Alert, clear speech, moving all extremities       ED Course/Procedures   Clinical Course as of Mar 01 1655  Wed Mar 01, 2019  1545 CK Total(!): 382 [HK]  1545 Higher than baseline at 1.  Creatinine(!): 1.31 [HK]    Clinical Course User Index [HK]  Delia Heady, PA-C    Procedures  Results for orders placed or performed during the hospital encounter of 03/01/19  Comprehensive metabolic panel  Result Value Ref Range   Sodium 140 135 - 145 mmol/L   Potassium 5.0 3.5 - 5.1 mmol/L   Chloride 102 98 - 111 mmol/L   CO2 23 22 - 32 mmol/L   Glucose, Bld 89 70 - 99 mg/dL   BUN 13 8 - 23 mg/dL   Creatinine, Ser 1.31 (H) 0.44 - 1.00 mg/dL   Calcium 8.6 (L) 8.9 - 10.3 mg/dL   Total Protein 7.6 6.5 - 8.1 g/dL   Albumin 3.2 (L) 3.5 - 5.0 g/dL   AST 73 (H) 15 - 41 U/L   ALT 25 0 - 44 U/L   Alkaline Phosphatase 66 38 - 126 U/L   Total Bilirubin 2.0 (H) 0.3 - 1.2 mg/dL   GFR calc non Af Amer 41 (L) >60 mL/min   GFR calc Af Amer 47 (L) >60 mL/min   Anion gap 15 5 - 15  CBC with Differential  Result Value Ref Range   WBC 11.6 (H) 4.0 - 10.5 K/uL   RBC 6.38 (H) 3.87 - 5.11 MIL/uL   Hemoglobin 15.1 (H) 12.0 - 15.0  g/dL   HCT 55.0 (H) 36.0 - 46.0 %   MCV 86.2 80.0 - 100.0 fL   MCH 23.7 (L) 26.0 - 34.0 pg   MCHC 27.5 (L) 30.0 - 36.0 g/dL   RDW 24.6 (H) 11.5 - 15.5 %   Platelets 149 (L) 150 - 400 K/uL   nRBC 0.9 (H) 0.0 - 0.2 %   Neutrophils Relative % 69 %   Neutro Abs 8.0 (H) 1.7 - 7.7 K/uL   Lymphocytes Relative 26 %   Lymphs Abs 3.0 0.7 - 4.0 K/uL   Monocytes Relative 4 %   Monocytes Absolute 0.5 0.1 - 1.0 K/uL   Eosinophils Relative 1 %   Eosinophils Absolute 0.1 0.0 - 0.5 K/uL   Basophils Relative 0 %   Basophils Absolute 0.0 0.0 - 0.1 K/uL   nRBC 1 (H) 0 /100 WBC   Abs Immature Granulocytes 0.00 0.00 - 0.07 K/uL   Schistocytes PRESENT    Tear Drop Cells PRESENT    Polychromasia PRESENT   Urinalysis, Routine w reflex microscopic  Result Value Ref Range   Color, Urine AMBER (A) YELLOW   APPearance CLOUDY (A) CLEAR   Specific Gravity, Urine 1.023 1.005 - 1.030   pH 5.0 5.0 - 8.0   Glucose, UA NEGATIVE NEGATIVE mg/dL   Hgb urine dipstick NEGATIVE NEGATIVE   Bilirubin Urine NEGATIVE NEGATIVE   Ketones, ur 80 (A) NEGATIVE  mg/dL   Protein, ur 100 (A) NEGATIVE mg/dL   Nitrite NEGATIVE NEGATIVE   Leukocytes,Ua NEGATIVE NEGATIVE   RBC / HPF 0-5 0 - 5 RBC/hpf   WBC, UA 0-5 0 - 5 WBC/hpf   Bacteria, UA RARE (A) NONE SEEN   Squamous Epithelial / LPF 6-10 0 - 5   Mucus PRESENT   CK  Result Value Ref Range   Total CK 382 (H) 38 - 234 U/L   Dg Chest 2 View  Result Date: 03/01/2019 CLINICAL DATA:  Anterior chest pain post fall. EXAM: CHEST - 2 VIEW COMPARISON:  February 14, 2019 FINDINGS: Cardiomediastinal silhouette is normal. Mediastinal contours appear intact. Tortuosity of the aorta. There is no evidence of pleural effusion or pneumothorax. Linear opacities in the left mid thorax, possibly posttreatment or postsurgical changes. No radiographic evidence of displaced rib fractures. Soft tissues are grossly normal. IMPRESSION: 1. Linear opacities in the left mid thorax, possibly posttreatment or postsurgical changes. 2. No radiographic evidence of displaced rib fractures. Electronically Signed   By: Fidela Salisbury M.D.   On: 03/01/2019 14:48   Dg Chest Port 1 View  Result Date: 02/14/2019 CLINICAL DATA:  Shortness of breath EXAM: PORTABLE CHEST 1 VIEW COMPARISON:  02/13/2019 FINDINGS: Cardiac shadow is stable. The lungs are well aerated bilaterally. No focal infiltrate or sizable effusion is noted. No bony abnormality is seen. IMPRESSION: No acute abnormality noted. Electronically Signed   By: Inez Catalina M.D.   On: 02/14/2019 07:51   Dg Chest Port 1 View  Result Date: 02/13/2019 CLINICAL DATA:  Shortness of breath. Respiratory failure and hypoxia. EXAM: PORTABLE CHEST 1 VIEW COMPARISON:  02/12/2019 FINDINGS: Prominent main pulmonary artery. Mild enlargement of the cardiopericardial silhouette indistinct opacity along the lingula. Left midlung scarring. Peripheral left basilar bandlike density. The right lung appears clear. Chronic deformity of the left lateral fifth and sixth ribs. IMPRESSION: 1. New indistinct  lingular opacity peripherally. This could be from a pleural process or airspace opacity. 2. Continued peripheral scarring in the left mid lung with adjacent chronic rib deformities. 3.  Mild enlargement of the cardiopericardial silhouette, without edema. 4. Prominent main pulmonary artery suggesting pulmonary arterial hypertension. Electronically Signed   By: Van Clines M.D.   On: 02/13/2019 08:03   Dg Chest Port 1 View  Result Date: 02/12/2019 CLINICAL DATA:  Acute chronic respiratory failure. EXAM: PORTABLE CHEST 1 VIEW COMPARISON:  02/11/2019 FINDINGS: Heart size is normal. No pleural effusion or edema. Stable chronic scarring within the left midlung. No superimposed airspace consolidation. IMPRESSION: 1. No active disease.  Unchanged left midlung scarring. Electronically Signed   By: Kerby Moors M.D.   On: 02/12/2019 08:28   Dg Chest Port 1 View  Result Date: 02/11/2019 CLINICAL DATA:  Shortness of breath. EXAM: PORTABLE CHEST 1 VIEW COMPARISON:  Chest x-rays dated 03/29/2015 and 01/11/2012. FINDINGS: Borderline cardiomegaly is stable. Fullness of the LEFT aortopulmonary window region, most likely a prominent main pulmonary artery suggesting chronic pulmonary artery hypertension. Increased interstitial prominence bilaterally, bibasilar predominant, LEFT slightly greater than RIGHT, presumably mild edema superimposed on chronic interstitial lung disease. No confluent opacity to suggest a consolidating pneumonia. No pleural effusion or pneumothorax seen. Osseous structures about the chest are unremarkable. IMPRESSION: 1. Probable mild bilateral interstitial edema superimposed on chronic interstitial lung disease. 2. No evidence of consolidating pneumonia or alveolar pulmonary edema. 3. Probable chronic pulmonary artery hypertension. 4. Stable borderline cardiomegaly. Electronically Signed   By: Franki Cabot M.D.   On: 02/11/2019 18:12   Dg Hips Bilat W Or Wo Pelvis 3-4 Views  Result Date:  03/01/2019 CLINICAL DATA:  Bilateral hip pain after fall today. EXAM: DG HIP (WITH OR WITHOUT PELVIS) 3-4V BILAT COMPARISON:  None. FINDINGS: There is no evidence of hip fracture or dislocation. There is no evidence of arthropathy or other focal bone abnormality. IMPRESSION: Negative. Electronically Signed   By: Marijo Conception M.D.   On: 03/01/2019 14:45   CRITICAL CARE Performed by: Rodney Booze   Total critical care time: 32 minutes  Critical care time was exclusive of separately billable procedures and treating other patients.  Critical care was necessary to treat or prevent imminent or life-threatening deterioration.  Critical care was time spent personally by me on the following activities: development of treatment plan with patient and/or surrogate as well as nursing, discussions with consultants, evaluation of patient's response to treatment, examination of patient, obtaining history from patient or surrogate, ordering and performing treatments and interventions, ordering and review of laboratory studies, ordering and review of radiographic studies, pulse oximetry and re-evaluation of patient's condition.   MDM   Briefly 73 y/o female presenting after mechanical fall where she slid off her bed onto the floor. States sxs secondary to neuropathy.   Workup thus far reviewed CBC with mild leukocytosis, worsening from 2 weeks ago.  Elevated hemoglobin at 15.1, may be due to hemoconcentration. CMP with mild AKI with creatinine at 1.3.  AST slightly elevated at 73.  Total bili slightly elevated at 2. CK marginally elevated at 382. UA with ketonuria and proteinuria, no leukocytes or nitrites.  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 Findings raise the possibility for ventricular or atrial septal defect.  8:19 PM CONSULT with Dr. Jonne Ply with cardiology. He reviewed  patients prior ECHO last month which did not show any evidence that these CT findings would be secondary to cardiac cause. He recommended to consult hematology.   8:28 PM CONSULT with Dr. [name]  with heme-onc who recommended ordering a hypercoagulable panel. If there are abnormalities seen, then hospitalist can re-consult heme for recommendations.  9:14 PM CONSULT with Dr. Blaine Hamper with hospitalist service who accepts patient for admission.      Bishop Dublin 03/01/19 [id number]    Fredia Sorrow, MD 03/01/19 2219

## 2019-03-01 NOTE — H&P (Addendum)
History and Physical    [name]:[id number] DOB: 06/02/1946 DOA: 03/01/2019  Referring MD/NP/PA:   PCP: System, Provider Not In   Patient coming from:  The patient is coming from home.  At baseline, pt is independent for most of ADL.        Chief Complaint: fall, generalized weakness, chest pain  HPI: [name] is a 73 y.o. 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  Patient states that she has generalized weakness in the past several days, particularly in both legs. She states that accidentally fell off the bed, then slid onto the floor this AM, laid there until her son found her approximately 6 hours later. She denies any head or neck injury, No loss of consciousness.  She denies numbness in arms or legs. No facial droop or slurred speech.  She states that she was too weak in her bilateral legs and was unable to ambulate.  She developed pain in both hips.  She also complains of central chest pain which is pleuritic. She has some mild shortness of breath and mild dry cough.  No tenderness in the calf areas.  She states that she had mild abdominal pain earlier, which has resolved.  Denies nausea, vomiting or diarrhea.  Denies symptoms of UTI.  No fever or chills.  ED Course: pt was found to have WBC 11.6, pending COVID-19 test, CK 382, worsening renal function, liver function (ALP 66, AST 73, ALT 25, total bilirubin 2.0), temperature 99.8, blood pressure 125/70, heart rate 92, RR 25, oxygen sat 94% on room air.  X-ray of bilateral hip is negative for fracture.  Chest x-ray showed linear opacities in the left mid thorax, possibly posttreatment or postsurgical changes. Pt is admitted to tele bed as inpt. Dr. Harrington Challenger of card was consulted.  # CT of chest and abd/pelvis with  contrast: Pulmonary emboli and emboli in the aortic arch with right renal infarcts and a splenic infarct. Findings raise the possibility for ventricular or atrial septal defect. No evidence of trauma.  Review of Systems:   General: no fevers, chills, no body weight gain, has fatigue HEENT: no blurry vision, hearing changes or sore throat Respiratory: has dyspnea, coughing, no wheezing CV: has chest pain, no palpitations GI: no nausea, vomiting, had abdominal pain, no diarrhea, constipation GU: no dysuria, burning on urination, increased urinary frequency, hematuria  Ext: no leg edema Neuro: no unilateral weakness, numbness, or tingling, no vision change or hearing loss Skin: no rash, no skin tear. MSK: No muscle spasm, no deformity, no limitation of range of movement in spin Heme: No easy bruising.  Travel history: No recent long distant travel.  Allergy: No Known Allergies  Past Medical History:  Diagnosis Date  . Asthma   . Atypical squamous cells of undetermined significance (ASCUS) on Papanicolaou smear of cervix   . COPD (chronic obstructive pulmonary disease) ([place])   . H/O drainage of abscess    left axilla, from left breast  . Hep B w/o coma   . HIV (human immunodeficiency virus infection) ([place])   . Hyperlipidemia   . Lung cancer ([place]) 12/14/13   LUL Adenocarcinoma  . Neuromuscular disorder ([place])    fingers/feet neuropathy  . Non-small cell lung cancer ([place])   . S/P radiation therapy 01/26/14-02/02/14   sbrt  lt upper lung-54Gy/65fx  Past Surgical History:  Procedure Laterality Date  . ABCESS DRAINAGE     abcess under Left arm, abcess from L breast  . removal of abnormal cells on uterus    . thyroid gland removed      Social History:  reports that she has been smoking cigarettes. She has a 40.00 pack-year smoking history. She has never used smokeless tobacco. She reports that she does not drink alcohol or use drugs.  Family History:  Family History  Problem  Relation Age of Onset  . Pneumonia Mother   . Hypertension Sister   . Diabetes Sister   . Cancer Sister        pancreatic and lung  . Cancer Brother        pancreatic  . Cancer Brother        pancreatic     Prior to Admission medications   Medication Sig Start Date End Date Taking? Authorizing Provider  albuterol (PROVENTIL HFA;VENTOLIN HFA) 108 (90 BASE) MCG/ACT inhaler Inhale 1 puff into the lungs every 6 (six) hours as needed for shortness of breath.    Yes [provider]  albuterol (PROVENTIL) (2.5 MG/3ML) 0.083% nebulizer solution Take 3 mLs (2.5 mg total) by nebulization every 4 (four) hours as needed for wheezing or shortness of breath. 01/18/19  Yes Jaynee Eagles, PA-C  Ascorbic Acid (VITAMIN C) 100 MG tablet Take 100 mg by mouth daily.   Yes [provider]  aspirin EC 81 MG tablet Take 81 mg by mouth daily.   Yes [provider]  ATRIPLA 600-200-300 MG tablet TAKE 1 TABLET BY MOUTH EVERY NIGHT AT BEDTIME Patient taking differently: Take 1 tablet by mouth at bedtime.  09/03/15  Yes Tommy Medal, Lavell Islam, MD  B Complex-Biotin-FA (VITAMIN B50 COMPLEX PO) Take 1 tablet by mouth daily.   Yes [provider]  Calcium Carbonate-Vitamin D (CALCIUM 600+D) 600-400 MG-UNIT per tablet Take 1 tablet by mouth daily.   Yes [provider]  Fluticasone-Salmeterol (ADVAIR) 500-50 MCG/DOSE AEPB Inhale 1 puff into the lungs every 12 (twelve) hours.   Yes [provider]  gabapentin (NEURONTIN) 300 MG capsule TAKE 2 CAPSULES BY MOUTH THREE TIMES A DAY Patient taking differently: Take 600 mg by mouth 3 (three) times daily.  01/14/15  Yes Tommy Medal, Lavell Islam, MD  loratadine (CLARITIN) 10 MG tablet Take 10 mg by mouth 2 (two) times daily.    Yes [provider]  montelukast (SINGULAIR) 10 MG tablet Take 1 tablet (10 mg total) by mouth at bedtime. 12/02/12  Yes Michel Bickers, MD  omega-3 acid ethyl esters (LOVAZA) 1 g capsule Take 1 g by mouth  daily.   Yes [provider]  predniSONE (DELTASONE) 20 MG tablet Take 2 tablets (40 mg total) by mouth daily with breakfast. 02/17/19  Yes Oretha Milch D, MD  SPIRIVA RESPIMAT 2.5 MCG/ACT AERS INHALE 2 PUFFS INTO THE LUNGS DAILY Patient taking differently: Inhale 2 puffs into the lungs daily.  09/24/14  Yes Rigoberto Noel, MD  tiZANidine (ZANAFLEX) 4 MG capsule Take 4 mg by mouth 2 (two) times daily.   Yes [provider]  venlafaxine XR (EFFEXOR-XR) 150 MG 24 hr capsule Take 150 mg by mouth daily with breakfast.    Yes [provider]  VITAMIN A PO Take 1 tablet by mouth daily.   Yes [provider]  ibuprofen (ADVIL,MOTRIN) 600 MG tablet Take 1 tablet (600 mg total) by mouth every 8 (eight) hours as  needed. Patient not taking: Reported on 03/01/2019 12/14/14   Jola Schmidt, MD  methocarbamol (ROBAXIN) 500 MG tablet Take 1 tablet (500 mg total) by mouth every 6 (six) hours as needed for muscle spasms. Patient not taking: Reported on 03/01/2019 09/15/14   [name], MD    Physical Exam: Vitals:   03/02/19 0300 03/02/19 0330 03/02/19 0400 03/02/19 0430  BP: (!) 107/54 (!) 122/58 133/65 (!) 154/127  Pulse:      Resp: (!) 23 (!) 21 (!) 23 (!) 35  Temp:      TempSrc:      SpO2:      Weight:      Height:       General: Not in acute distress HEENT:       Eyes: PERRL, EOMI, no scleral icterus.       ENT: No discharge from the ears and nose, no pharynx injection, no tonsillar enlargement.        Neck: No JVD, no bruit, no mass felt. Heme: No neck lymph node enlargement. Cardiac: S1/S2, RRR, No murmurs, No gallops or rubs. Respiratory:  No rales, wheezing, rhonchi or rubs. GI: Soft, nondistended, nontender, no rebound pain, no organomegaly, BS present. GU: No hematuria Ext: No pitting leg edema bilaterally. 2+DP/PT pulse bilaterally. Musculoskeletal: No joint deformities, No joint redness or warmth, no limitation of ROM in spin. Skin: No rashes.  Neuro:  Alert, oriented X3, cranial nerves II-XII grossly intact, moves all extremities normally. Muscle strength 4/5 in all extremities, sensation to light touch intact. Brachial reflex 2+ bilaterally. Negative Babinski's sign. Normal finger to nose test. Psych: Patient is not psychotic, no suicidal or hemocidal ideation.  Labs on Admission: I have personally reviewed following labs and imaging studies  CBC: Recent Labs  Lab 03/01/19 1330 03/02/19 0209  WBC 11.6* 10.3  NEUTROABS 8.0*  --   HGB 15.1* 14.2  HCT 55.0* 53.9*  MCV 86.2 88.8  PLT 149* 324*   Basic Metabolic Panel: Recent Labs  Lab 03/01/19 1330  NA 140  K 5.0  CL 102  CO2 23  GLUCOSE 89  BUN 13  CREATININE 1.31*  CALCIUM 8.6*   GFR: Estimated Creatinine Clearance: 49.7 mL/min (A) (by C-G formula based on SCr of 1.31 mg/dL (H)). Liver Function Tests: Recent Labs  Lab 03/01/19 1330  AST 73*  ALT 25  ALKPHOS 66  BILITOT 2.0*  PROT 7.6  ALBUMIN 3.2*   No results for input(s): LIPASE, AMYLASE in the last 168 hours. No results for input(s): AMMONIA in the last 168 hours. Coagulation Profile: No results for input(s): INR, PROTIME in the last 168 hours. Cardiac Enzymes: Recent Labs  Lab 03/01/19 1330  CKTOTAL 382*   BNP (last 3 results) No results for input(s): PROBNP in the last 8760 hours. HbA1C: No results for input(s): HGBA1C in the last 72 hours. CBG: No results for input(s): GLUCAP in the last 168 hours. Lipid Profile: No results for input(s): CHOL, HDL, LDLCALC, TRIG, CHOLHDL, LDLDIRECT in the last 72 hours. Thyroid Function Tests: No results for input(s): TSH, T4TOTAL, FREET4, T3FREE, THYROIDAB in the last 72 hours. Anemia Panel: No results for input(s): VITAMINB12, FOLATE, FERRITIN, TIBC, IRON, RETICCTPCT in the last 72 hours. Urine analysis:    Component Value Date/Time   COLORURINE AMBER (A) 03/01/2019 1721   APPEARANCEUR CLOUDY (A) 03/01/2019 1721   LABSPEC 1.023 03/01/2019 1721   PHURINE  5.0 03/01/2019 1721   GLUCOSEU NEGATIVE 03/01/2019 1721   HGBUR NEGATIVE 03/01/2019 1721  BILIRUBINUR NEGATIVE 03/01/2019 1721   KETONESUR 80 (A) 03/01/2019 1721   PROTEINUR 100 (A) 03/01/2019 1721   UROBILINOGEN 0.2 03/28/2015 1227   NITRITE NEGATIVE 03/01/2019 1721   LEUKOCYTESUR NEGATIVE 03/01/2019 1721   Sepsis Labs: @LABRCNTIP (procalcitonin:4,lacticidven:4) ) Recent Results (from the past 240 hour(s))  SARS CORONAVIRUS 2 (TAT 6-24 HRS) Nasopharyngeal Nasopharyngeal Swab     Status: None   Collection Time: 03/01/19 10:10 PM   Specimen: Nasopharyngeal Swab  Result Value Ref Range Status   SARS Coronavirus 2 NEGATIVE NEGATIVE Final    Comment: (NOTE) SARS-CoV-2 target nucleic acids are NOT DETECTED. The SARS-CoV-2 RNA is generally detectable in upper and lower respiratory specimens during the acute phase of infection. Negative results do not preclude SARS-CoV-2 infection, do not rule out co-infections with other pathogens, and should not be used as the sole basis for treatment or other patient management decisions. Negative results must be combined with clinical observations, patient history, and epidemiological information. The expected result is Negative. Fact Sheet for Patients: SugarRoll.be Fact Sheet for Healthcare Providers: https://www.woods-mathews.com/ This test is not yet approved or cleared by the [place] FDA and  has been authorized for detection and/or diagnosis of SARS-CoV-2 by FDA under an Emergency Use Authorization (EUA). This EUA will remain  in effect (meaning this test can be used) for the duration of the COVID-19 declaration under Section 56 4(b)(1) of the Act, 21 U.S.C. section 360bbb-3(b)(1), unless the authorization is terminated or revoked sooner. Performed at [facility], [place] [address], [place], [place] [address]   Culture, blood (Routine X 2) w Reflex to ID Panel     Status: None  (Preliminary result)   Collection Time: 03/02/19  2:09 AM   Specimen: BLOOD RIGHT FOREARM  Result Value Ref Range Status   Specimen Description BLOOD RIGHT FOREARM  Final   Special Requests   Final    BOTTLES DRAWN AEROBIC AND ANAEROBIC Blood Culture results may not be optimal due to an inadequate volume of blood received in culture bottles Performed at [facility], [place] [address], [place], [place] [address]    Culture PENDING  Incomplete   Report Status PENDING  Incomplete     Radiological Exams on Admission: Dg Chest 2 View  Result Date: 03/01/2019 CLINICAL DATA:  Anterior chest pain post fall. EXAM: CHEST - 2 VIEW COMPARISON:  February 14, 2019 FINDINGS: Cardiomediastinal silhouette is normal. Mediastinal contours appear intact. Tortuosity of the aorta. There is no evidence of pleural effusion or pneumothorax. Linear opacities in the left mid thorax, possibly posttreatment or postsurgical changes. No radiographic evidence of displaced rib fractures. Soft tissues are grossly normal. IMPRESSION: 1. Linear opacities in the left mid thorax, possibly posttreatment or postsurgical changes. 2. No radiographic evidence of displaced rib fractures. Electronically Signed   By: Fidela Salisbury M.D.   On: 03/01/2019 14:48   Ct Chest W Contrast  Result Date: 03/01/2019 CLINICAL DATA:  Patient status post fall out of bed at 6 a.m. this morning. Bilateral lower extremity weakness. EXAM: CT CHEST, ABDOMEN, AND PELVIS WITH CONTRAST TECHNIQUE: Multidetector CT imaging of the chest, abdomen and pelvis was performed following the standard protocol during bolus administration of intravenous contrast. CONTRAST:  155mL OMNIPAQUE IOHEXOL 300 MG/ML  SOLN COMPARISON:  CT chest 11/03/2013.  PET CT scan 11/22/2013. FINDINGS: CT CHEST FINDINGS Cardiovascular: Pulmonary embolus are seen in right pulmonary arteries such as on image 44 of series 6 and 64-67 of series 6. Emboli are also seen in  the aortic arch such as  on images 21 and 22 of series 3 and 69 to 76 of series 7. Heart size is normal. No pleural or pericardial effusion. Calcific aortic and coronary atherosclerosis. Mediastinum/Nodes: No enlarged mediastinal, hilar, or axillary lymph nodes. Thyroid gland, trachea, and esophagus demonstrate no significant findings. Lungs/Pleura: Lungs are emphysematous. There is scar in the left upper lobe at the site of patient's prior lung cancer. Mild dependent atelectasis is noted. Musculoskeletal: No acute or focal bony abnormality. CT ABDOMEN PELVIS FINDINGS Hepatobiliary: No focal liver abnormality is seen. No gallstones, gallbladder wall thickening, or biliary dilatation. The liver is low attenuating consistent with fatty infiltration. Pancreas: 0.4 cm hypoattenuating lesion at the junction of the tail and body on image 65 is likely due to some prior inflammatory process. No peripancreatic inflammatory change. No pancreatic duct dilatation. Spleen: Wedge-shaped hypoattenuation in the superior aspect of the spleen measuring 2.7 cm AP x 2.7 cm transverse by 2.0 cm craniocaudal is consistent with infarct. Spleen is otherwise unremarkable. Adrenals/Urinary Tract: The adrenal glands appear normal. Wedge-shaped area peripheral hypoattenuation in the mid to upper pole of the right kidney is compatible with an infarct. More subtle area of decreased cortical attenuation in the more superior aspect of the right kidney is also worrisome for infarct. Left kidney appears normal. Ureters and urinary bladder are unremarkable. Stomach/Bowel: Stomach is within normal limits. Appendix appears normal. No evidence of bowel wall thickening, distention, or inflammatory changes. Vascular/Lymphatic: Aortic atherosclerosis. No enlarged abdominal or pelvic lymph nodes. Reproductive: Uterus and bilateral adnexa are unremarkable. Other: None. Musculoskeletal: No acute or focal abnormality. Degenerative disc disease L5-S1 noted. IMPRESSION: Pulmonary emboli  and emboli in the aortic arch with right renal infarcts and a splenic infarct. Findings raise the possibility for ventricular or atrial septal defect. No evidence of trauma. Fatty infiltration of the liver. Atherosclerosis. Emphysema. These results were called by telephone at the time of interpretation on 03/01/2019 at 7:29 pm to [facility], [facility], who verbally acknowledged these results. Electronically Signed   By: Inge Rise M.D.   On: 03/01/2019 19:36   Ct Abdomen Pelvis W Contrast  Result Date: 03/01/2019 CLINICAL DATA:  Patient status post fall out of bed at 6 a.m. this morning. Bilateral lower extremity weakness. EXAM: CT CHEST, ABDOMEN, AND PELVIS WITH CONTRAST TECHNIQUE: Multidetector CT imaging of the chest, abdomen and pelvis was performed following the standard protocol during bolus administration of intravenous contrast. CONTRAST:  150mL OMNIPAQUE IOHEXOL 300 MG/ML  SOLN COMPARISON:  CT chest 11/03/2013.  PET CT scan 11/22/2013. FINDINGS: CT CHEST FINDINGS Cardiovascular: Pulmonary embolus are seen in right pulmonary arteries such as on image 12 of series 6 and 64-67 of series 6. Emboli are also seen in the aortic arch such as on images 21 and 22 of series 3 and 69 to 76 of series 7. Heart size is normal. No pleural or pericardial effusion. Calcific aortic and coronary atherosclerosis. Mediastinum/Nodes: No enlarged mediastinal, hilar, or axillary lymph nodes. Thyroid gland, trachea, and esophagus demonstrate no significant findings. Lungs/Pleura: Lungs are emphysematous. There is scar in the left upper lobe at the site of patient's prior lung cancer. Mild dependent atelectasis is noted. Musculoskeletal: No acute or focal bony abnormality. CT ABDOMEN PELVIS FINDINGS Hepatobiliary: No focal liver abnormality is seen. No gallstones, gallbladder wall thickening, or biliary dilatation. The liver is low attenuating consistent with fatty infiltration. Pancreas: 0.4 cm hypoattenuating lesion at the  junction of the tail and body on image 65 is likely  due to some prior inflammatory process. No peripancreatic inflammatory change. No pancreatic duct dilatation. Spleen: Wedge-shaped hypoattenuation in the superior aspect of the spleen measuring 2.7 cm AP x 2.7 cm transverse by 2.0 cm craniocaudal is consistent with infarct. Spleen is otherwise unremarkable. Adrenals/Urinary Tract: The adrenal glands appear normal. Wedge-shaped area peripheral hypoattenuation in the mid to upper pole of the right kidney is compatible with an infarct. More subtle area of decreased cortical attenuation in the more superior aspect of the right kidney is also worrisome for infarct. Left kidney appears normal. Ureters and urinary bladder are unremarkable. Stomach/Bowel: Stomach is within normal limits. Appendix appears normal. No evidence of bowel wall thickening, distention, or inflammatory changes. Vascular/Lymphatic: Aortic atherosclerosis. No enlarged abdominal or pelvic lymph nodes. Reproductive: Uterus and bilateral adnexa are unremarkable. Other: None. Musculoskeletal: No acute or focal abnormality. Degenerative disc disease L5-S1 noted. 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 Findings raise the possibility for ventricular or atrial septal defect. No evidence of trauma. Fatty infiltration of the liver. Atherosclerosis. Emphysema. These results were called by telephone at the time of interpretation on 03/01/2019 at 7:29 pm to [facility], [facility], who verbally acknowledged these results. Electronically Signed   By: Inge Rise M.D.   On: 03/01/2019 19:36   Dg Hips Bilat W Or Wo Pelvis 3-4 Views  Result Date: 03/01/2019 CLINICAL DATA:  Bilateral hip pain after fall today. EXAM: DG HIP (WITH OR WITHOUT PELVIS) 3-4V BILAT COMPARISON:  None. FINDINGS: There is no evidence of hip fracture or dislocation. There is no evidence of arthropathy or other focal bone  abnormality. IMPRESSION: Negative. Electronically Signed   By: Marijo Conception M.D.   On: 03/01/2019 14:45     EKG: Independently reviewed.  Sinus rhythm, QTC 474, low voltage, LAE, T wave inversion in V1-V3  Assessment/Plan Principal Problem:   PE (pulmonary thromboembolism) (HCC) Active Problems:   Human immunodeficiency virus (HIV) disease (HCC)   Hypertension   Cigarette smoker   Lung cancer (HCC)   Non-small cell lung cancer (HCC)   COPD (chronic obstructive pulmonary disease) (HCC)   Tobacco abuse   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HCC)   CKD (chronic kidney disease), stage III ([place])   Fall at home   Generalized weakness   PE (pulmonary thromboembolism) (HCC) and Aortic thrombus with with renal and splenic infarcts: Patient's chest pain is likely due to PE and aortic thrombus formation. CT scan findings raise the possibility for ventricular or atrial septal defect. Cardiology, Dr. Kalman Shan was consulted. Per Dr. Kalman Shan, pt had an echo from 2 weeks ago that showed normal LV systolic function, size, and wall motion. There was no shunt by color doppler. Cardiac source of thromboembolism very unlikely per Dr. Kalman Shan.  Hematology, Dr. [name] was consulted by EDP, who recommended hypercoagulable panel.   -admit to tele bed as inpt -heparin drip was initiated by EDP -2D echocardiogram ordered -LE dopplers ordered to evaluate for DVT -trop x 3 -Hypercoag panel -pain control: When necessary Percocet and morphine  Human immunodeficiency virus (HIV) disease ([place]): -continue Atripla  HTN: not on Bp meds. Bp 125/70 -IV hydralazine prn  Cigarette smoker/tobacco abuse -Did counseling about importance of quitting smoking -Nicotine patch  Lung cancer (Non-small cell lung cancer [facility]): s/p of radiation therapy.  No chemo surgery per patient. CT-chest with contrast showed a scar in the left upper lobe at the site of patient's prior lung cancer. No new mass.  -f/u  with oncology  COPD (chronic obstructive  pulmonary disease) ([place]): Stable. -Continue bronchodilators  CKD (chronic kidney disease), stage III ([place]): Slightly worsening than baseline.  Baseline creatinine is about ~1.0.  His creatinine is 1.01, BUN 13 -f/u by BMP -Avoid using and renal toxic medications, such as NSAIDS  Fall and generalized weakness: Most likely due to multifactorial etiology, including PE, aortic thrombus formation. No LOC. -will get CT-head  -pt/ot  Mild Leukocytosis: WBC 11.6, no fever or signs of infection. Likely reactive. UA negative. -will follow up blood and urine culture -follow up by CBC   Inpatient status:  # Patient requires inpatient status due to high intensity of service, high risk for further deterioration and high frequency of surveillance required.  I certify that at the point of admission it is my clinical judgment that the patient will require inpatient hospital care spanning beyond 2 midnights from the point of admission.  . 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 Now patient has presenting symptoms include pulmonary embolism, aortic thrombus, renal and splenic infarction, generalized weakness, fall, . The worrisome physical exam findings include weakness in both legs . The initial radiographic and laboratory data are worrisome because of leukocytosis, pulmonary embolism and aortic thrombus with renal and splenic infarction by CT scan. . Current medical needs: please see my assessment and plan . Predictability of an adverse outcome (risk): Patient has a multiple communities, now presents with pulmonary embolism, aortic thrombus, renal and splenic infarction, generalized weakness, fall.  Her presentation is highly complicated.  Patient is at high risk of her deteriorating.  Will need to be treated in hospital for at least 2 days.    DVT ppx: SQ Heparin   Code Status: Full code Family Communication: None at bed side.   Disposition Plan:  Anticipate discharge back to previous home environment Consults called:  Dr. Harrington Challenger of card and Dr. [name] of heme-onc) Admission status: Inpatient/tele   Date of Service 03/02/2019    [facility]   If 7PM-7AM, please contact night-coverage [email or web address] Password [email or web address] 03/02/2019, 5:41 AM

## 2019-03-02 ENCOUNTER — Inpatient Hospital Stay (HOSPITAL_COMMUNITY): Payer: Medicare Other

## 2019-03-02 DIAGNOSIS — I639 Cerebral infarction, unspecified: Secondary | ICD-10-CM

## 2019-03-02 DIAGNOSIS — I6389 Other cerebral infarction: Secondary | ICD-10-CM

## 2019-03-02 DIAGNOSIS — I10 Essential (primary) hypertension: Secondary | ICD-10-CM

## 2019-03-02 DIAGNOSIS — I2699 Other pulmonary embolism without acute cor pulmonale: Secondary | ICD-10-CM

## 2019-03-02 HISTORY — DX: Cerebral infarction, unspecified: I63.9

## 2019-03-02 LAB — HEMOGLOBIN A1C
Hgb A1c MFr Bld: 6.3 % — ABNORMAL HIGH (ref 4.8–5.6)
Mean Plasma Glucose: 134.11 mg/dL

## 2019-03-02 LAB — CBC
HCT: 53.9 % — ABNORMAL HIGH (ref 36.0–46.0)
HCT: 53.7 % — ABNORMAL HIGH (ref 36.0–46.0)
Hemoglobin: 14.2 g/dL (ref 12.0–15.0)
Hemoglobin: 14.4 g/dL (ref 12.0–15.0)
MCH: 23.4 pg — ABNORMAL LOW (ref 26.0–34.0)
MCH: 23.7 pg — ABNORMAL LOW (ref 26.0–34.0)
MCHC: 26.3 g/dL — ABNORMAL LOW (ref 30.0–36.0)
MCHC: 26.8 g/dL — ABNORMAL LOW (ref 30.0–36.0)
MCV: 88.8 fL (ref 80.0–100.0)
MCV: 88.5 fL (ref 80.0–100.0)
Platelets: 142 10*3/uL — ABNORMAL LOW (ref 150–400)
Platelets: 137 10*3/uL — ABNORMAL LOW (ref 150–400)
RBC: 6.07 MIL/uL — ABNORMAL HIGH (ref 3.87–5.11)
RBC: 6.07 MIL/uL — ABNORMAL HIGH (ref 3.87–5.11)
RDW: 24.3 % — ABNORMAL HIGH (ref 11.5–15.5)
RDW: 24.4 % — ABNORMAL HIGH (ref 11.5–15.5)
WBC: 10.3 10*3/uL (ref 4.0–10.5)
WBC: 9.6 10*3/uL (ref 4.0–10.5)
nRBC: 0.3 % — ABNORMAL HIGH (ref 0.0–0.2)
nRBC: 0 % (ref 0.0–0.2)

## 2019-03-02 LAB — BASIC METABOLIC PANEL
Anion gap: 14 (ref 5–15)
BUN: 10 mg/dL (ref 8–23)
CO2: 26 mmol/L (ref 22–32)
Calcium: 7.9 mg/dL — ABNORMAL LOW (ref 8.9–10.3)
Chloride: 97 mmol/L — ABNORMAL LOW (ref 98–111)
Creatinine, Ser: 1.04 mg/dL — ABNORMAL HIGH (ref 0.44–1.00)
GFR calc Af Amer: >60 mL/min (ref >60)
GFR calc non Af Amer: 54 mL/min — ABNORMAL LOW (ref >60)
Glucose, Bld: 80 mg/dL (ref 70–99)
Potassium: 3.5 mmol/L (ref 3.5–5.1)
Sodium: 137 mmol/L (ref 135–145)

## 2019-03-02 LAB — SARS CORONAVIRUS 2 (TAT 6-24 HRS): SARS Coronavirus 2: NEGATIVE

## 2019-03-02 LAB — LIPID PANEL
Cholesterol: 170 mg/dL (ref 0–200)
HDL: 36 mg/dL — ABNORMAL LOW (ref >40)
LDL Cholesterol: 103 mg/dL — ABNORMAL HIGH (ref 0–99)
Total CHOL/HDL Ratio: 4.7 RATIO
Triglycerides: 154 mg/dL — ABNORMAL HIGH (ref <150)
VLDL: 31 mg/dL (ref 0–40)

## 2019-03-02 LAB — ECHOCARDIOGRAM COMPLETE
Height: 69 in
Weight: 3552.05 oz

## 2019-03-02 LAB — HEPARIN LEVEL (UNFRACTIONATED): Heparin Unfractionated: 0.34 IU/mL (ref 0.30–0.70)

## 2019-03-02 LAB — TROPONIN I (HIGH SENSITIVITY)
Troponin I (High Sensitivity): 117 ng/L (ref <18)
Troponin I (High Sensitivity): 128 ng/L (ref <18)

## 2019-03-02 LAB — CBG MONITORING, ED: Glucose-Capillary: 102 mg/dL — ABNORMAL HIGH (ref 70–99)

## 2019-03-02 MED ORDER — CALCIUM CARBONATE-VITAMIN D 500-200 MG-UNIT PO TABS
1.0000 | ORAL_TABLET | Freq: Every day | ORAL | Status: DC
  Administered 2019-03-02 – 2019-03-10 (×7): 1 via ORAL
  Filled 2019-03-02 (×7): qty 1

## 2019-03-02 MED ORDER — ONDANSETRON HCL 4 MG/2ML IJ SOLN
4.0000 mg | Freq: Four times a day (QID) | INTRAMUSCULAR | Status: DC | PRN
  Administered 2019-03-02: 4 mg via INTRAVENOUS
  Filled 2019-03-02: qty 2

## 2019-03-02 MED ORDER — ACETAMINOPHEN 325 MG PO TABS
650.0000 mg | ORAL_TABLET | Freq: Four times a day (QID) | ORAL | Status: DC | PRN
  Administered 2019-03-02 – 2019-03-10 (×6): 650 mg via ORAL
  Filled 2019-03-02 (×6): qty 2

## 2019-03-02 MED ORDER — GABAPENTIN 300 MG PO CAPS
600.0000 mg | ORAL_CAPSULE | Freq: Three times a day (TID) | ORAL | Status: DC
  Administered 2019-03-02 (×3): 600 mg via ORAL
  Filled 2019-03-02 (×3): qty 2

## 2019-03-02 MED ORDER — STROKE: EARLY STAGES OF RECOVERY BOOK: Freq: Once | Status: DC

## 2019-03-02 MED ORDER — VITAMIN A 3 MG (10000 UNIT) PO CAPS
10000.0000 [IU] | ORAL_CAPSULE | Freq: Every day | ORAL | Status: DC
  Administered 2019-03-02: 10000 [IU] via ORAL
  Filled 2019-03-02 (×2): qty 1

## 2019-03-02 MED ORDER — HEPARIN (PORCINE) 25000 UT/250ML-% IV SOLN
1950.0000 [IU]/h | INTRAVENOUS | Status: AC
  Administered 2019-03-02: 1600 [IU]/h via INTRAVENOUS
  Administered 2019-03-03: 1850 [IU]/h via INTRAVENOUS
  Administered 2019-03-03: 1600 [IU]/h via INTRAVENOUS
  Administered 2019-03-04: 2000 [IU]/h via INTRAVENOUS
  Administered 2019-03-04: 1850 [IU]/h via INTRAVENOUS
  Administered 2019-03-05 (×2): 1900 [IU]/h via INTRAVENOUS
  Filled 2019-03-02 (×6): qty 250

## 2019-03-02 MED ORDER — HYDRALAZINE HCL 20 MG/ML IJ SOLN: 5.0000 mg | INTRAMUSCULAR | Status: DC | PRN

## 2019-03-02 MED ORDER — NICOTINE 21 MG/24HR TD PT24
21.0000 mg | MEDICATED_PATCH | Freq: Every day | TRANSDERMAL | Status: DC
  Administered 2019-03-02 – 2019-03-10 (×8): 21 mg via TRANSDERMAL
  Filled 2019-03-02 (×8): qty 1

## 2019-03-02 MED ORDER — ACETAMINOPHEN 650 MG RE SUPP: 650.0000 mg | Freq: Four times a day (QID) | RECTAL | Status: DC | PRN

## 2019-03-02 MED ORDER — ONDANSETRON HCL 4 MG PO TABS: 4.0000 mg | ORAL_TABLET | Freq: Four times a day (QID) | ORAL | Status: DC | PRN

## 2019-03-02 NOTE — Progress Notes (Signed)
[place] for heparin Indication: pulmonary embolus  Heparin Dosing Weight: 88.1 kg  Labs: Recent Labs    03/01/19 1330 03/02/19 0209 03/02/19 0525  HGB 15.1* 14.2 14.4  HCT 55.0* 53.9* 53.7*  PLT 149* 142* 137*  HEPARINUNFRC  --   --  0.34  CREATININE 1.31*  --  1.04*  CKTOTAL 382*  --   --     Assessment: 22 yof presenting s/p fall (slid to floor from bed), hip pain. 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 Pharmacy consulted to dose heparin for PE. No anticoagulants PTA.  Heparin was turned off this afternoon d/t concern for stroke with hemorrhagic conversion. Complete neurology note is still pending but appears that they believe hemorrhagic component is older and have ordered to restart IV heparin. Will aim for lower/middle heparin level range. Avoid bolusing.  Heparin level this morning was 0.34 on 1550 units/hr, will restart at 1600 units/hr  Goal of Therapy:  Heparin level 0.3-0.5 units/ml Monitor platelets by anticoagulation protocol: Yes   Plan:  Start heparin at 1600 units/hr 8h heparin level Daily heparin level/CBC Monitor s/sx bleeding  Erin Hearing PharmD., BCPS Clinical Pharmacist 03/02/2019 5:41 PM

## 2019-03-02 NOTE — ED Notes (Signed)
Patient transported to CT 

## 2019-03-02 NOTE — Progress Notes (Signed)
Carotid artery and bilateral lower extremity venous duplexes have been completed. Preliminary results can be found in CV Proc through chart review.   03/02/19 11:36 AM [name] RVT

## 2019-03-02 NOTE — Progress Notes (Signed)
PROGRESS NOTE    [name]   QAS:[id number]  DOB: 1946-06-14  DOA: 03/01/2019 PCP: System, Provider Not In   Brief Narrative:  [name] is a 73 y.o. 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 Patient had noted generalized weakness and leg pain for a number of days and then fell out of the bed around 6 AM and laid on the floor until her son found her about 6 hours later.  She was too weak to ambulate.  She noted right-sided lower rib pain after the fall.  The patient underwent a CT of the chest abdomen pelvis in the ED.  Results showed pulmonary emboli and emboli in the aortic arch with a right renal infarcts and a splenic infarct.  Also noted is a fatty liver and emphysema. She was started on a heparin infusion.  She subsequently had a CT scan of her head which was ordered by the admitting doctor and showed acute right MCA territory ischemic infarct. 10 mm hemorrhage in the right basal ganglia  She was recently admitted from 8/15-8/21 for acute respiratory failure due to COPD and overmedications and her Robaxin, Gabapentin and Seroquel. She required a BiPAP. She was discharged home on 4 L O2 and a CPAP.  Subjective: The patient was evaluated this morning and complained of ongoing leg pain due to neuropathy.  She was shaking her right leg repeatedly.  She had no other complaints.    Assessment & Plan:   Principal Problem:   PE (pulmonary thromboembolism) -acute respiratory failure - The patient is quite hypoxic and requiring 5 to 6 L of oxygen - Heparin infusion has had to be discontinued due to CVA with hemorrhagic transformation - Bilateral lower extremity venous duplex is negative for DVT  Active Problems:   Aortic thrombus  -As mentioned above, we are  unfortunately unable to anticoagulate    CVA (cerebral vascular accident)  - left facial droop, mild weakness on left side with subjective decreased sensation - She may have a PFO and CVA may have been as a result of the aortic thrombus breaking into emboli which traveled through the PFO and up to her brain -Her 2D echo does not show a PFO - Neurology will be following and giving further input    Human immunodeficiency virus (HIV) disease  -Last CD4 count was 406 on 02/13/2019 -Continue Atripla  Neuropathy in legs - Continue gabapentin     Cigarette smoker - COPD (chronic obstructive pulmonary disease)   - advised to stop smoking - no COPD exacerbation at this time    Non-small cell lung cancer   - s/p radiation    CKD (chronic kidney disease), stage II- III    - stable   Time spent in minutes: 35 DVT prophylaxis: SCDs Code Status: Full code Family Communication:  Disposition Plan: cont to follow in hospital- will ask for a palliative care consult Consultants:   Neuro Procedures:   2D echo 1. The left ventricle has hyperdynamic systolic function, with an ejection fraction of >65%. The cavity size was normal. There is mildly increased left ventricular wall thickness. Left ventricular diastolic Doppler parameters are consistent with impaired relaxation. 2. The right ventricle has normal systolic function. The cavity was normal. 3. The mitral valve is abnormal. Mild thickening of the mitral valve leaflet. 4. The tricuspid  valve is grossly normal. 5. The aortic valve is tricuspid. Mild calcification of the aortic valve. No stenosis of the aortic valve. 6. The aorta is normal unless otherwise noted. 7. Vigorous LV systolic function; mild LVH; grade 1 diastolic dysfunction; trace TR; moderate pulmonary hypertension Antimicrobials:  Anti-infectives (From admission, onward)   Start     Dose/Rate Route Frequency Ordered Stop   03/01/19 2215  efavirenz-emtricitabine-tenofovir  (ATRIPLA) 600-200-300 MG per tablet 1 tablet     1 tablet Oral Daily at bedtime 03/01/19 2206         Objective: Vitals:   03/02/19 1030 03/02/19 1045 03/02/19 1100 03/02/19 1329  BP: 118/63  (!) 114/94 (!) 101/58  Pulse:  86 81 79  Resp:      Temp:    (!) 97.5 F (36.4 C)  TempSrc:    Oral  SpO2:  100% 100% 99%  Weight:      Height:        Intake/Output Summary (Last 24 hours) at 03/02/2019 1343 Last data filed at 03/01/2019 1710 Gross per 24 hour  Intake 1000 ml  Output --  Net 1000 ml   Filed Weights   03/01/19 2000  Weight: 100.7 kg    Examination: General exam: Appears comfortable  HEENT: PERRLA, oral mucosa moist, no sclera icterus or thrush Respiratory system: Clear to auscultation. Respiratory effort normal. Cardiovascular system: S1 & S2 heard, RRR.   Gastrointestinal system: Abdomen soft, non-tender, nondistended. Normal bowel sounds. Central nervous system: Alert and oriented. No focal neurological deficits. Extremities: No cyanosis, clubbing or edema Skin: No rashes or ulcers Psychiatry:  Mood & affect appropriate.     Data Reviewed: I have personally reviewed following labs and imaging studies  CBC: Recent Labs  Lab 03/01/19 1330 03/02/19 0209 03/02/19 0525  WBC 11.6* 10.3 9.6  NEUTROABS 8.0*  --   --   HGB 15.1* 14.2 14.4  HCT 55.0* 53.9* 53.7*  MCV 86.2 88.8 88.5  PLT 149* 142* 938*   Basic Metabolic Panel: Recent Labs  Lab 03/01/19 1330 03/02/19 0525  NA 140 137  K 5.0 3.5  CL 102 97*  CO2 23 26  GLUCOSE 89 80  BUN 13 10  CREATININE 1.31* 1.04*  CALCIUM 8.6* 7.9*   GFR: Estimated Creatinine Clearance: 62.7 mL/min (A) (by C-G formula based on SCr of 1.04 mg/dL (H)). Liver Function Tests: Recent Labs  Lab 03/01/19 1330  AST 73*  ALT 25  ALKPHOS 66  BILITOT 2.0*  PROT 7.6  ALBUMIN 3.2*   No results for input(s): LIPASE, AMYLASE in the last 168 hours. No results for input(s): AMMONIA in the last 168 hours. Coagulation  Profile: No results for input(s): INR, PROTIME in the last 168 hours. Cardiac Enzymes: Recent Labs  Lab 03/01/19 1330  CKTOTAL 382*   BNP (last 3 results) No results for input(s): PROBNP in the last 8760 hours. HbA1C: Recent Labs    03/02/19 0525  HGBA1C 6.3*   CBG: Recent Labs  Lab 03/02/19 0644  GLUCAP 102*   Lipid Profile: Recent Labs    03/02/19 0525  CHOL 170  HDL 36*  LDLCALC 103*  TRIG 154*  CHOLHDL 4.7   Thyroid Function Tests: No results for input(s): TSH, T4TOTAL, FREET4, T3FREE, THYROIDAB in the last 72 hours. Anemia Panel: No results for input(s): VITAMINB12, FOLATE, FERRITIN, TIBC, IRON, RETICCTPCT in the last 72 hours. Urine analysis:    Component Value Date/Time   COLORURINE AMBER (A) 03/01/2019 1721   APPEARANCEUR CLOUDY (  A) 03/01/2019 1721   LABSPEC 1.023 03/01/2019 1721   PHURINE 5.0 03/01/2019 1721   GLUCOSEU NEGATIVE 03/01/2019 1721   HGBUR NEGATIVE 03/01/2019 1721   BILIRUBINUR NEGATIVE 03/01/2019 1721   KETONESUR 80 (A) 03/01/2019 1721   PROTEINUR 100 (A) 03/01/2019 1721   UROBILINOGEN 0.2 03/28/2015 1227   NITRITE NEGATIVE 03/01/2019 1721   LEUKOCYTESUR NEGATIVE 03/01/2019 1721   Sepsis Labs: @LABRCNTIP (procalcitonin:4,lacticidven:4) ) Recent Results (from the past 240 hour(s))  Urine culture     Status: None (Preliminary result)   Collection Time: 03/01/19  5:10 PM   Specimen: Urine, Random  Result Value Ref Range Status   Specimen Description URINE, RANDOM  Final   Special Requests NONE  Final   Culture   Final    CULTURE REINCUBATED FOR BETTER GROWTH Performed at [facility], [place] [address], [place], [place] [address]    Report Status PENDING  Incomplete  SARS CORONAVIRUS 2 (TAT 6-24 HRS) Nasopharyngeal Nasopharyngeal Swab     Status: None   Collection Time: 03/01/19 10:10 PM   Specimen: Nasopharyngeal Swab  Result Value Ref Range Status   SARS Coronavirus 2 NEGATIVE NEGATIVE Final    Comment: (NOTE) SARS-CoV-2  target nucleic acids are NOT DETECTED. The SARS-CoV-2 RNA is generally detectable in upper and lower respiratory specimens during the acute phase of infection. Negative results do not preclude SARS-CoV-2 infection, do not rule out co-infections with other pathogens, and should not be used as the sole basis for treatment or other patient management decisions. Negative results must be combined with clinical observations, patient history, and epidemiological information. The expected result is Negative. Fact Sheet for Patients: SugarRoll.be Fact Sheet for Healthcare Providers: https://www.woods-mathews.com/ This test is not yet approved or cleared by the [place] FDA and  has been authorized for detection and/or diagnosis of SARS-CoV-2 by FDA under an Emergency Use Authorization (EUA). This EUA will remain  in effect (meaning this test can be used) for the duration of the COVID-19 declaration under Section 56 4(b)(1) of the Act, 21 U.S.C. section 360bbb-3(b)(1), unless the authorization is terminated or revoked sooner. Performed at [facility], [place] [address] [place], [place] [address]   Culture, blood (Routine X 2) w Reflex to ID Panel     Status: None (Preliminary result)   Collection Time: 03/02/19  2:09 AM   Specimen: BLOOD RIGHT FOREARM  Result Value Ref Range Status   Specimen Description BLOOD RIGHT FOREARM  Final   Special Requests   Final    BOTTLES DRAWN AEROBIC AND ANAEROBIC Blood Culture results may not be optimal due to an inadequate volume of blood received in culture bottles Performed at [facility], [place] [address], [place], [place] [address]    Culture PENDING  Incomplete   Report Status PENDING  Incomplete         Radiology Studies: Dg Chest 2 View  Result Date: 03/01/2019 CLINICAL DATA:  Anterior chest pain post fall. EXAM: CHEST - 2 VIEW COMPARISON:  February 14, 2019 FINDINGS: Cardiomediastinal  silhouette is normal. Mediastinal contours appear intact. Tortuosity of the aorta. There is no evidence of pleural effusion or pneumothorax. Linear opacities in the left mid thorax, possibly posttreatment or postsurgical changes. No radiographic evidence of displaced rib fractures. Soft tissues are grossly normal. IMPRESSION: 1. Linear opacities in the left mid thorax, possibly posttreatment or postsurgical changes. 2. No radiographic evidence of displaced rib fractures. Electronically Signed   By: Linwood Dibbles.D.  On: 03/01/2019 14:48   Ct Head Wo Contrast  Result Date: 03/02/2019 CLINICAL DATA:  [place] out of bed. EXAM: CT HEAD WITHOUT CONTRAST TECHNIQUE: Contiguous axial images were obtained from the base of the skull through the vertex without intravenous contrast. COMPARISON:  MR brain dated Nov 22, 2013. FINDINGS: Brain: There is hypodensity and loss of the gray-white matter differentiation predominantly involving the right temporal lobe, consistent with acute infarct. 8 x 8 x 10 mm area of hemorrhage in the right basal ganglia with surrounding edema. No midline shift, hydrocephalus, or extra-axial collection. Vascular: Atherosclerotic vascular calcification of the carotid siphons. No hyperdense vessel. Skull: Normal. Negative for fracture or focal lesion. Sinuses/Orbits: No acute finding. Mild ethmoid air cell mucosal thickening. Other: None. IMPRESSION: 1. Acute right MCA territory ischemic infarct. 10 mm hemorrhage in the right basal ganglia. No midline shift or hydrocephalus. Critical Value/emergent results were called by telephone at the time of interpretation on 03/02/2019 at 7:55 am to Dr. Debbe Odea, who verbally acknowledged these results. Electronically Signed   By: Titus Dubin M.D.   On: 03/02/2019 07:59   Ct Chest W Contrast  Result Date: 03/01/2019 CLINICAL DATA:  Patient status post fall out of bed at 6 a.m. this morning. Bilateral lower extremity weakness. EXAM: CT CHEST,  ABDOMEN, AND PELVIS WITH CONTRAST TECHNIQUE: Multidetector CT imaging of the chest, abdomen and pelvis was performed following the standard protocol during bolus administration of intravenous contrast. CONTRAST:  160mL OMNIPAQUE IOHEXOL 300 MG/ML  SOLN COMPARISON:  CT chest 11/03/2013.  PET CT scan 11/22/2013. FINDINGS: CT CHEST FINDINGS Cardiovascular: Pulmonary embolus are seen in right pulmonary arteries such as on image 74 of series 6 and 64-67 of series 6. Emboli are also seen in the aortic arch such as on images 21 and 22 of series 3 and 69 to 76 of series 7. Heart size is normal. No pleural or pericardial effusion. Calcific aortic and coronary atherosclerosis. Mediastinum/Nodes: No enlarged mediastinal, hilar, or axillary lymph nodes. Thyroid gland, trachea, and esophagus demonstrate no significant findings. Lungs/Pleura: Lungs are emphysematous. There is scar in the left upper lobe at the site of patient's prior lung cancer. Mild dependent atelectasis is noted. Musculoskeletal: No acute or focal bony abnormality. CT ABDOMEN PELVIS FINDINGS Hepatobiliary: No focal liver abnormality is seen. No gallstones, gallbladder wall thickening, or biliary dilatation. The liver is low attenuating consistent with fatty infiltration. Pancreas: 0.4 cm hypoattenuating lesion at the junction of the tail and body on image 65 is likely due to some prior inflammatory process. No peripancreatic inflammatory change. No pancreatic duct dilatation. Spleen: Wedge-shaped hypoattenuation in the superior aspect of the spleen measuring 2.7 cm AP x 2.7 cm transverse by 2.0 cm craniocaudal is consistent with infarct. Spleen is otherwise unremarkable. Adrenals/Urinary Tract: The adrenal glands appear normal. Wedge-shaped area peripheral hypoattenuation in the mid to upper pole of the right kidney is compatible with an infarct. More subtle area of decreased cortical attenuation in the more superior aspect of the right kidney is also  worrisome for infarct. Left kidney appears normal. Ureters and urinary bladder are unremarkable. Stomach/Bowel: Stomach is within normal limits. Appendix appears normal. No evidence of bowel wall thickening, distention, or inflammatory changes. Vascular/Lymphatic: Aortic atherosclerosis. No enlarged abdominal or pelvic lymph nodes. Reproductive: Uterus and bilateral adnexa are unremarkable. Other: None. Musculoskeletal: No acute or focal abnormality. Degenerative disc disease L5-S1 noted. 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 Findings raise the  possibility for ventricular or atrial septal defect. No evidence of trauma. Fatty infiltration of the liver. Atherosclerosis. Emphysema. These results were called by telephone at the time of interpretation on 03/01/2019 at 7:29 pm to [facility] [place], who verbally acknowledged these results. Electronically Signed   By: Inge Rise M.D.   On: 03/01/2019 19:36   Ct Abdomen Pelvis W Contrast  Result Date: 03/01/2019 CLINICAL DATA:  Patient status post fall out of bed at 6 a.m. this morning. Bilateral lower extremity weakness. EXAM: CT CHEST, ABDOMEN, AND PELVIS WITH CONTRAST TECHNIQUE: Multidetector CT imaging of the chest, abdomen and pelvis was performed following the standard protocol during bolus administration of intravenous contrast. CONTRAST:  142mL OMNIPAQUE IOHEXOL 300 MG/ML  SOLN COMPARISON:  CT chest 11/03/2013.  PET CT scan 11/22/2013. FINDINGS: CT CHEST FINDINGS Cardiovascular: Pulmonary embolus are seen in right pulmonary arteries such as on image 13 of series 6 and 64-67 of series 6. Emboli are also seen in the aortic arch such as on images 21 and 22 of series 3 and 69 to 76 of series 7. Heart size is normal. No pleural or pericardial effusion. Calcific aortic and coronary atherosclerosis. Mediastinum/Nodes: No enlarged mediastinal, hilar, or axillary lymph nodes. Thyroid gland,  trachea, and esophagus demonstrate no significant findings. Lungs/Pleura: Lungs are emphysematous. There is scar in the left upper lobe at the site of patient's prior lung cancer. Mild dependent atelectasis is noted. Musculoskeletal: No acute or focal bony abnormality. CT ABDOMEN PELVIS FINDINGS Hepatobiliary: No focal liver abnormality is seen. No gallstones, gallbladder wall thickening, or biliary dilatation. The liver is low attenuating consistent with fatty infiltration. Pancreas: 0.4 cm hypoattenuating lesion at the junction of the tail and body on image 65 is likely due to some prior inflammatory process. No peripancreatic inflammatory change. No pancreatic duct dilatation. Spleen: Wedge-shaped hypoattenuation in the superior aspect of the spleen measuring 2.7 cm AP x 2.7 cm transverse by 2.0 cm craniocaudal is consistent with infarct. Spleen is otherwise unremarkable. Adrenals/Urinary Tract: The adrenal glands appear normal. Wedge-shaped area peripheral hypoattenuation in the mid to upper pole of the right kidney is compatible with an infarct. More subtle area of decreased cortical attenuation in the more superior aspect of the right kidney is also worrisome for infarct. Left kidney appears normal. Ureters and urinary bladder are unremarkable. Stomach/Bowel: Stomach is within normal limits. Appendix appears normal. No evidence of bowel wall thickening, distention, or inflammatory changes. Vascular/Lymphatic: Aortic atherosclerosis. No enlarged abdominal or pelvic lymph nodes. Reproductive: Uterus and bilateral adnexa are unremarkable. Other: None. Musculoskeletal: No acute or focal abnormality. Degenerative disc disease L5-S1 noted. 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 Findings raise the possibility for ventricular or atrial septal defect. No evidence of trauma. Fatty infiltration of the liver. Atherosclerosis. Emphysema. These results  were called by telephone at the time of interpretation on 03/01/2019 at 7:29 pm to [facility] [place], who verbally acknowledged these results. Electronically Signed   By: Inge Rise M.D.   On: 03/01/2019 19:36   Dg Hips Bilat W Or Wo Pelvis 3-4 Views  Result Date: 03/01/2019 CLINICAL DATA:  Bilateral hip pain after fall today. EXAM: DG HIP (WITH OR WITHOUT PELVIS) 3-4V BILAT COMPARISON:  None. FINDINGS: There is no evidence of hip fracture or dislocation. There is no evidence of arthropathy or other focal bone abnormality. IMPRESSION: Negative. Electronically Signed   By: Marijo Conception M.D.   On: 03/01/2019 14:45  Vas US Carotid (at [facility] Only)  Result Date: 03/02/2019 Carotid Arterial Duplex Study Indications:       CVA. Risk Factors:      None. Limitations        Today's exam was limited due to the body habitus of the                    patient, patient movement, patient positioning and the                    patient's respiratory variation. Comparison Study:  No prior studies. Performing Technologist: Oliver Hum RVT  Examination Guidelines: A complete evaluation includes B-mode imaging, spectral Doppler, color Doppler, and power Doppler as needed of all accessible portions of each vessel. Bilateral testing is considered an integral part of a complete examination. Limited examinations for reoccurring indications may be performed as noted.  Right Carotid Findings: +----------+--------+--------+--------+-----------------------+--------+             PSV cm/s EDV cm/s Stenosis Plaque Description      Comments  +----------+--------+--------+--------+-----------------------+--------+  CCA Prox   68       13                smooth and heterogenous           +----------+--------+--------+--------+-----------------------+--------+  CCA Distal 65       11                smooth and heterogenous           +----------+--------+--------+--------+-----------------------+--------+  ICA Prox   58       14                 smooth and heterogenous tortuous  +----------+--------+--------+--------+-----------------------+--------+  ICA Distal 57       22                                        tortuous  +----------+--------+--------+--------+-----------------------+--------+  ECA        60       11                                                  +----------+--------+--------+--------+-----------------------+--------+ +----------+--------+-------+--------+-------------------+             PSV cm/s EDV cms Describe Arm Pressure (mmHG)  +----------+--------+-------+--------+-------------------+  Subclavian 111                                            +----------+--------+-------+--------+-------------------+ +---------+--------+--+--------+--+---------+  Vertebral PSV cm/s 73 EDV cm/s 24 Antegrade  +---------+--------+--+--------+--+---------+  Left Carotid Findings: +----------+--------+--------+--------+-----------------------+--------+             PSV cm/s EDV cm/s Stenosis Plaque Description      Comments  +----------+--------+--------+--------+-----------------------+--------+  CCA Prox   101      19                smooth and heterogenous           +----------+--------+--------+--------+-----------------------+--------+  CCA Distal 79       16  smooth and heterogenous           +----------+--------+--------+--------+-----------------------+--------+  ICA Prox   80       25                smooth and heterogenous tortuous  +----------+--------+--------+--------+-----------------------+--------+  ICA Distal 60       29                                        tortuous  +----------+--------+--------+--------+-----------------------+--------+  ECA        66                                                           +----------+--------+--------+--------+-----------------------+--------+ +----------+--------+--------+--------+-------------------+             PSV cm/s EDV cm/s Describe Arm Pressure (mmHG)   +----------+--------+--------+--------+-------------------+  Subclavian 151                                             +----------+--------+--------+--------+-------------------+ +---------+--------+--+--------+--+---------+  Vertebral PSV cm/s 72 EDV cm/s 27 Antegrade  +---------+--------+--+--------+--+---------+  Summary: Right Carotid: Velocities in the right ICA are consistent with a 1-39% stenosis. Left Carotid: Velocities in the left ICA are consistent with a 1-39% stenosis. Vertebrals: Bilateral vertebral arteries demonstrate antegrade flow. *See table(s) above for measurements and observations.  Electronically signed by Antony Contras MD on 03/02/2019 at 1:16:38 PM.    Final    Vas [name] Lower Extremity Venous (dvt)  Result Date: 03/02/2019  Lower Venous Study Indications: Pulmonary embolism, and stroke.  Risk Factors: None identified. Limitations: Body habitus, poor ultrasound/tissue interface and patient movement, patient positioning. Comparison Study: No prior studies. Performing Technologist: Oliver Hum RVT  Examination Guidelines: A complete evaluation includes B-mode imaging, spectral Doppler, color Doppler, and power Doppler as needed of all accessible portions of each vessel. Bilateral testing is considered an integral part of a complete examination. Limited examinations for reoccurring indications may be performed as noted.  +---------+---------------+---------+-----------+----------+--------------+  RIGHT     Compressibility Phasicity Spontaneity Properties Thrombus Aging  +---------+---------------+---------+-----------+----------+--------------+  CFV       Full            Yes       Yes                                    +---------+---------------+---------+-----------+----------+--------------+  SFJ       Full                                                             +---------+---------------+---------+-----------+----------+--------------+  FV Prox   Full                                                              +---------+---------------+---------+-----------+----------+--------------+  FV Mid    Full                                                             +---------+---------------+---------+-----------+----------+--------------+  FV Distal Full                                                             +---------+---------------+---------+-----------+----------+--------------+  PFV       Full                                                             +---------+---------------+---------+-----------+----------+--------------+  POP       Full            Yes       Yes                                    +---------+---------------+---------+-----------+----------+--------------+  PTV       Full                                                             +---------+---------------+---------+-----------+----------+--------------+  PERO      Full                                                             +---------+---------------+---------+-----------+----------+--------------+   +---------+---------------+---------+-----------+----------+--------------+  LEFT      Compressibility Phasicity Spontaneity Properties Thrombus Aging  +---------+---------------+---------+-----------+----------+--------------+  CFV       Full            Yes       Yes                                    +---------+---------------+---------+-----------+----------+--------------+  SFJ       Full                                                             +---------+---------------+---------+-----------+----------+--------------+  FV Prox   Full                                                             +---------+---------------+---------+-----------+----------+--------------+  FV Mid    Full                                                             +---------+---------------+---------+-----------+----------+--------------+  FV Distal Full                                                              +---------+---------------+---------+-----------+----------+--------------+  PFV       Full                                                             +---------+---------------+---------+-----------+----------+--------------+  POP       Full            Yes       Yes                                    +---------+---------------+---------+-----------+----------+--------------+  PTV       Full                                                             +---------+---------------+---------+-----------+----------+--------------+  PERO      Full                                                             +---------+---------------+---------+-----------+----------+--------------+     Summary: Right: There is no evidence of deep vein thrombosis in the lower extremity. No cystic structure found in the popliteal fossa. Left: There is no evidence of deep vein thrombosis in the lower extremity. No cystic structure found in the popliteal fossa.  *See table(s) above for measurements and observations.    Preliminary       Scheduled Meds:   stroke: mapping our early stages of recovery book   Does not apply Once   calcium-vitamin D  1 tablet Oral Q breakfast   efavirenz-emtricitabine-tenofovir  1 tablet Oral QHS   gabapentin  600 mg Oral TID   loratadine  10 mg Oral BID   mometasone-formoterol  2 puff Inhalation BID   montelukast  10 mg Oral QHS   nicotine  21 mg Transdermal Daily   omega-3 acid ethyl esters  1 g Oral Daily   tiZANidine  4 mg Oral BID   umeclidinium bromide  2 puff Inhalation Daily   venlafaxine XR  150 mg Oral Q breakfast   vitamin A  10,000 Units Oral Daily   vitamin C  125  mg Oral Daily   Continuous Infusions:   LOS: 1 day      Debbe Odea, MD [facility] Pager: [email or web address] Password [email or web address] 03/02/2019, 1:43 PM

## 2019-03-02 NOTE — ED Notes (Signed)
The pt has been moaning and groaning in pain for the past 3 hours intermittently  She was giiven pain med at [facility]

## 2019-03-02 NOTE — Evaluation (Signed)
\Speech Language Pathology Evaluation Patient Details Name: [name] MRN: [id number] DOB: December 01, 1945 Today's Date: 03/02/2019 Time: 7741-2878 SLP Time Calculation (min) (ACUTE ONLY): 24 min  Problem List:  Patient Active Problem List   Diagnosis Date Noted  . CVA (cerebral vascular accident) ([place]) 03/02/2019  . COPD (chronic obstructive pulmonary disease) ([place]) 03/01/2019  . PE (pulmonary thromboembolism) ([place]) 03/01/2019  . Tobacco abuse 03/01/2019  . 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 CKD (chronic kidney disease), stage III ([place]) 03/01/2019  . Generalized weakness 03/01/2019  . Fall at home   . 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 Splenic infarct   . Acute and chronic respiratory failure (acute-on-chronic) ([place]) 02/11/2019  . Sepsis ([place]) 03/28/2015  . CAP (community acquired pneumonia) 03/28/2015  . UTI (lower urinary tract infection) 03/28/2015  . Respiratory failure ([place]) 03/28/2015  . Non-small cell lung cancer ([place])   . Malignant neoplasm of upper lobe, bronchus or lung 01/03/2014  . COPD with acute exacerbation ([place]) 10/30/2013  . COPD exacerbation ([place]) 10/30/2013  . Other and unspecified noninfectious gastroenteritis and colitis(558.9) 11/20/2012  . Rectal bleed 11/19/2012  . Obesity 05/05/2012  . Renal insufficiency 05/05/2012  . Hyperglycemia 05/05/2012  . Asthma 12/10/2011  . Cigarette smoker 12/10/2011  . Hyperlipidemia 12/11/2010  . Hypertension 12/11/2010  . ALLERGIC RHINITIS, SEASONAL 07/03/2010  . COPD 07/03/2010  . Human immunodeficiency virus (HIV) disease ([place]) 12/09/2006  . DEPRESSION 12/09/2006  . BREAST MASS, BENIGN 12/09/2006  . OSTEOPOROSIS 12/09/2006   Past Medical History:  Past Medical History:  Diagnosis Date  . Asthma   . Atypical squamous cells of undetermined significance (ASCUS) on Papanicolaou smear of cervix   . COPD (chronic obstructive pulmonary disease) ([place])   . H/O drainage of abscess    left axilla, from left breast  . 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 HIV (human immunodeficiency virus infection) ([place])   . Hyperlipidemia   . Lung cancer ([place]) 12/14/13   LUL Adenocarcinoma  . Neuromuscular disorder ([place])    fingers/feet neuropathy  . Non-small cell lung cancer ([place])   . S/P radiation therapy 01/26/14-02/02/14   sbrt  lt upper lung-54Gy/9fx   Past Surgical History:  Past Surgical History:  Procedure Laterality Date  . ABCESS DRAINAGE     abcess under Left arm, abcess from L breast  . removal of abnormal cells on uterus    . thyroid gland removed     HPI:  Pt is a 73 y.o. 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 CT of the head revealed acute right MCA territory ischemic infarct. 10 mm hemorrhage in the right basal ganglia.   Assessment / Plan / Recommendation Clinical Impression  Pt reported that she was living independently prior to admission. Per the pt she completed community college and is currently retired. She denied any baseline deficits or acute changes in speech, language or cognition. The [facility] Cognitive Assessment 8.1 was completed to evaluate the pt's cognitive-linguistic skills. She achieved a score of 9/30 which is below the normal limits of 26 or more out of 30 and is suggestive of a severe impairment. She demonstrated deficits in the areas of executive function, problem solving, awareness, attention, mental manipulation, divergent naming, abstract reasoning, orientation, and memory. Her language skills were within normal limits and her motor speech skills were functional but  articulatory precision waned as the evaluation progressed. Skilled SLP services are clinically indicated at this time to improve her cognitive-linguistic skills. Pt, and nursing were educated regarding results and recommendations; both  parties verbalized understanding as well as agreement with plan of care.    SLP Assessment  SLP Recommendation/Assessment: Patient needs continued Speech Lanaguage Pathology Services SLP Visit Diagnosis: Cognitive communication deficit (R41.841)    Follow Up Recommendations  Other (comment)(Continued SLP services at level of care recommended by PT/OT)    Frequency and Duration min 2x/week  2 weeks      SLP Evaluation Cognition  Overall Cognitive Status: Impaired/Different from baseline Arousal/Alertness: Awake/alert Orientation Level: Oriented to person;Oriented to place;Oriented to situation;Disoriented to time(Oriented to year not month, date, day) Attention: Focused;Sustained Focused Attention: Appears intact(Vigilance WNL: 1/1) Sustained Attention: Impaired Sustained Attention Impairment: Verbal complex(Serial 7s: 0/3) Memory: Impaired Memory Impairment: Storage deficit;Retrieval deficit;Decreased recall of new information(Immediate: 2/5; delayed: 0/5; with cues: 0/5) Awareness: Impaired Awareness Impairment: Intellectual impairment Problem Solving: Impaired Problem Solving Impairment: Verbal complex Executive Function: Reasoning;Sequencing;Organizing Reasoning: Impaired Reasoning Impairment: Verbal complex(Abstraction: 0/2) Sequencing: Impaired Sequencing Impairment: Verbal complex(Clock drawing: 1/3) Organizing: Impaired Organizing Impairment: Verbal complex(Backward digit span: 0/1)       Comprehension  Auditory Comprehension Overall Auditory Comprehension: Appears within functional limits for tasks assessed Yes/No Questions: Within Functional Limits Commands: Impaired Complex Commands: (Trail completion: 0/1) Conversation: Simple Visual Recognition/Discrimination Discrimination: Within Function Limits Reading Comprehension Reading Status: Not tested    Expression Expression Primary Mode of Expression: Verbal Verbal Expression Overall Verbal Expression:  Appears within functional limits for tasks assessed Initiation: No impairment Level of Generative/Spontaneous Verbalization: Sentence Naming: Impairment Responsive: Not tested Confrontation: (2/3) Convergent: Not tested Written Expression Dominant Hand: Right Written Expression: (Difficulty copying cube: 0/1)   Oral / Motor  Oral Motor/Sensory Function Overall Oral Motor/Sensory Function: Within functional limits Motor Speech Overall Motor Speech: Appears within functional limits for tasks assessed Respiration: Within functional limits Phonation: Normal Resonance: Within functional limits Articulation: Within functional limitis Intelligibility: Intelligible Motor Planning: Witnin functional limits   Alesi Zachery I. Hardin Negus, [place], [place] Office number [phone] Pager [place] 03/02/2019, 4:58 PM

## 2019-03-02 NOTE — Progress Notes (Signed)
  Echocardiogram 2D Echocardiogram has been performed.  Burnett Kanaris 03/02/2019, 9:51 AM

## 2019-03-02 NOTE — ED Notes (Signed)
ED TO INPATIENT HANDOFF REPORT  ED Nurse Name and Phone #:   S Name/Age/Gender [name] 73 y.o. female Room/Bed: 033C/033C  Code Status   Code Status: Full Code  Home/SNF/Other   Triage Complete: Triage complete  Chief Complaint Back hip pain; Fall  Triage Note PT fell out of bed at approx. 0600 . Pt lives at home and could not call 911. Pt reports later she was able to call brother and he called 73. Pt had to roll around on the floor . Pt denies any LOC and any injury to head. Pt reports legs were weak and could not stand up.   Allergies No Known Allergies  Level of Care/Admitting Diagnosis ED Disposition    ED Disposition Condition [facility] Area: [place] [[id number]]  Level of Care: Telemetry Medical [104]  Covid Evaluation: Asymptomatic Screening Protocol (No Symptoms)  Diagnosis: PE (pulmonary thromboembolism) [facility]) [[id number]]  Admitting Physician: Ivor Costa [[id number]]  Attending Physician: Ivor Costa [phone]  Estimated length of stay: past midnight tomorrow  Certification:: I certify this patient will need inpatient services for at least 2 midnights  PT Class (Do Not Modify): Inpatient [101]  PT Acc Code (Do Not Modify): Private [1]       B Medical/Surgery History Past Medical History:  Diagnosis Date  . Asthma   . Atypical squamous cells of undetermined significance (ASCUS) on Papanicolaou smear of cervix   . COPD (chronic obstructive pulmonary disease) ([place])   . H/O drainage of abscess    left axilla, from left breast  . Hep B w/o coma   . HIV (human immunodeficiency virus infection) ([place])   . Hyperlipidemia   . 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 Neuromuscular disorder ([place])    fingers/feet neuropathy  . Non-small cell lung cancer ([place])   . S/P radiation therapy 01/26/14-02/02/14   sbrt  lt upper lung-54Gy/41fx   Past Surgical History:  Procedure Laterality Date  . ABCESS DRAINAGE     abcess  under Left arm, abcess from L breast  . removal of abnormal cells on uterus    . thyroid gland removed       A IV Location/Drains/Wounds Patient Lines/Drains/Airways Status   Active Line/Drains/Airways    Name:   Placement date:   Placement time:   Site:   Days:   Peripheral IV 03/01/19 Left Hand   03/01/19    1330    Hand   1   Midline Single Lumen 02/12/19 Midline Left Brachial 8 cm 0 cm   02/12/19    0548    Brachial   18          Intake/Output Last 24 hours  Intake/Output Summary (Last 24 hours) at 03/02/2019 1252 Last data filed at 03/01/2019 1710 Gross per 24 hour  Intake 1000 ml  Output -  Net 1000 ml    Labs/Imaging Results for orders placed or performed during the hospital encounter of 03/01/19 (from the past 48 hour(s))  Comprehensive metabolic panel     Status: Abnormal   Collection Time: 03/01/19  1:30 PM  Result Value Ref Range   Sodium 140 135 - 145 mmol/L   Potassium 5.0 3.5 - 5.1 mmol/L    Comment: SPECIMEN HEMOLYZED. HEMOLYSIS MAY AFFECT INTEGRITY OF RESULTS.   Chloride 102 98 - 111 mmol/L   CO2 23 22 - 32 mmol/L   Glucose, Bld 89 70 - 99 mg/dL   BUN 13  8 - 23 mg/dL   Creatinine, Ser 1.31 (H) 0.44 - 1.00 mg/dL   Calcium 8.6 (L) 8.9 - 10.3 mg/dL   Total Protein 7.6 6.5 - 8.1 g/dL   Albumin 3.2 (L) 3.5 - 5.0 g/dL   AST 73 (H) 15 - 41 U/L   ALT 25 0 - 44 U/L   Alkaline Phosphatase 66 38 - 126 U/L   Total Bilirubin 2.0 (H) 0.3 - 1.2 mg/dL   GFR calc non Af Amer 41 (L) >60 mL/min   GFR calc Af Amer 47 (L) >60 mL/min   Anion gap 15 5 - 15    Comment: Performed at [facility] [address] [place], [place] [address]  CBC with Differential     Status: Abnormal   Collection Time: 03/01/19  1:30 PM  Result Value Ref Range   WBC 11.6 (H) 4.0 - 10.5 K/uL   RBC 6.38 (H) 3.87 - 5.11 MIL/uL   Hemoglobin 15.1 (H) 12.0 - 15.0 g/dL   HCT 55.0 (H) 36.0 - 46.0 %   MCV 86.2 80.0 - 100.0 fL   MCH 23.7 (L) 26.0 - 34.0 pg   MCHC 27.5 (L) 30.0 - 36.0 g/dL    RDW 24.6 (H) 11.5 - 15.5 %   Platelets 149 (L) 150 - 400 K/uL   nRBC 0.9 (H) 0.0 - 0.2 %   Neutrophils Relative % 69 %   Neutro Abs 8.0 (H) 1.7 - 7.7 K/uL   Lymphocytes Relative 26 %   Lymphs Abs 3.0 0.7 - 4.0 K/uL   Monocytes Relative 4 %   Monocytes Absolute 0.5 0.1 - 1.0 K/uL   Eosinophils Relative 1 %   Eosinophils Absolute 0.1 0.0 - 0.5 K/uL   Basophils Relative 0 %   Basophils Absolute 0.0 0.0 - 0.1 K/uL   nRBC 1 (H) 0 /100 WBC   Abs Immature Granulocytes 0.00 0.00 - 0.07 K/uL   Schistocytes PRESENT    Tear Drop Cells PRESENT    Polychromasia PRESENT     Comment: Performed at [facility], [place] [address] [place], [place] [address]  CK     Status: Abnormal   Collection Time: 03/01/19  1:30 PM  Result Value Ref Range   Total CK 382 (H) 38 - 234 U/L    Comment: Performed at [facility], [place] [address], [place], [place] [address]  Urinalysis, Routine w reflex microscopic     Status: Abnormal   Collection Time: 03/01/19  5:21 PM  Result Value Ref Range   Color, Urine AMBER (A) YELLOW    Comment: BIOCHEMICALS MAY BE AFFECTED BY COLOR   APPearance CLOUDY (A) CLEAR   Specific Gravity, Urine 1.023 1.005 - 1.030   pH 5.0 5.0 - 8.0   Glucose, UA NEGATIVE NEGATIVE mg/dL   Hgb urine dipstick NEGATIVE NEGATIVE   Bilirubin Urine NEGATIVE NEGATIVE   Ketones, ur 80 (A) NEGATIVE mg/dL   Protein, ur 100 (A) NEGATIVE mg/dL   Nitrite NEGATIVE NEGATIVE   Leukocytes,Ua NEGATIVE NEGATIVE   RBC / HPF 0-5 0 - 5 RBC/hpf   WBC, UA 0-5 0 - 5 WBC/hpf   Bacteria, UA RARE (A) NONE SEEN   Squamous Epithelial / LPF 6-10 0 - 5   Mucus PRESENT     Comment: Performed at [facility], [address] [place], [place] [address]  Antithrombin III     Status: None   Collection Time: 03/01/19  9:07 PM  Result Value Ref Range   AntiThromb III Func 96 75 - 120 %    Comment: Performed at [facility] [address], [place], [place] [address]  SARS CORONAVIRUS 2 (TAT 6-24 HRS)  Nasopharyngeal Nasopharyngeal Swab     Status: None   Collection Time: 03/01/19 10:10 PM   Specimen: Nasopharyngeal Swab  Result Value Ref Range   SARS Coronavirus 2 NEGATIVE NEGATIVE    Comment: (NOTE) SARS-CoV-2 target nucleic acids are NOT DETECTED. The SARS-CoV-2 RNA is generally detectable in upper and lower respiratory specimens during the acute phase of infection. Negative results do not preclude SARS-CoV-2 infection, do not rule out co-infections with other pathogens, and should not be used as the sole basis for treatment or other patient management decisions. Negative results must be combined with clinical observations, patient history, and epidemiological information. The expected result is Negative. Fact Sheet for Patients: SugarRoll.be Fact Sheet for Healthcare Providers: https://www.woods-mathews.com/ This test is not yet approved or cleared by the [place] FDA and  has been authorized for detection and/or diagnosis of SARS-CoV-2 by FDA under an Emergency Use Authorization (EUA). This EUA will remain  in effect (meaning this test can be used) for the duration of the COVID-19 declaration under Section 56 4(b)(1) of the Act, 21 U.S.C. section 360bbb-3(b)(1), unless the authorization is terminated or revoked sooner. Performed at [facility], [place] [address], [place], [place] [address]   CBC     Status: Abnormal   Collection Time: 03/02/19  2:09 AM  Result Value Ref Range   WBC 10.3 4.0 - 10.5 K/uL   RBC 6.07 (H) 3.87 - 5.11 MIL/uL   Hemoglobin 14.2 12.0 - 15.0 g/dL   HCT 53.9 (H) 36.0 - 46.0 %   MCV 88.8 80.0 - 100.0 fL   MCH 23.4 (L) 26.0 - 34.0 pg   MCHC 26.3 (L) 30.0 - 36.0 g/dL   RDW 24.3 (H) 11.5 - 15.5 %   Platelets 142 (L) 150 - 400 K/uL   nRBC 0.3 (H) 0.0 - 0.2 %    Comment: Performed at [facility], [place] [address], [place], [place] [address]  Troponin I (High Sensitivity)     Status: Abnormal    Collection Time: 03/02/19  2:09 AM  Result Value Ref Range   Troponin I (High Sensitivity) 117 (HH) <18 ng/L    Comment: CRITICAL RESULT CALLED TO, READ BACK BY AND VERIFIED WITH: CHRISCOE,C RN 03/02/2019 0348 JORDANS (NOTE) Elevated high sensitivity troponin I (hsTnI) values and significant  changes across serial measurements may suggest ACS but many other  chronic and acute conditions are known to elevate hsTnI results.  Refer to the Links section for chest pain algorithms and additional  guidance. Performed at [facility], [place] [address], [place], [place] [address]   Culture, blood (Routine X 2) w Reflex to ID Panel     Status: None (Preliminary result)   Collection Time: 03/02/19  2:09 AM   Specimen: BLOOD RIGHT FOREARM  Result Value Ref Range   Specimen Description BLOOD RIGHT FOREARM    Special Requests      BOTTLES DRAWN AEROBIC AND ANAEROBIC Blood Culture results may not be optimal due to an inadequate volume of blood received in culture bottles Performed at [facility] [address] [place] [address] [place], [place] [address]    Culture PENDING    Report Status PENDING   Heparin level (unfractionated)     Status: None  Collection Time: 03/02/19  5:25 AM  Result Value Ref Range   Heparin Unfractionated 0.34 0.30 - 0.70 IU/mL    Comment: (NOTE) If heparin results are below expected values, and patient dosage has  been confirmed, suggest follow up testing of antithrombin III levels. Performed at [facility], [place] [address], [place], [place] [address]   Hemoglobin A1c     Status: Abnormal   Collection Time: 03/02/19  5:25 AM  Result Value Ref Range   Hgb A1c MFr Bld 6.3 (H) 4.8 - 5.6 %    Comment: (NOTE) Pre diabetes:          5.7%-6.4% Diabetes:              >6.4% Glycemic control for   <7.0% adults with diabetes    Mean Plasma Glucose 134.11 mg/dL    Comment: Performed at [facility] [address] [place], [place] [address]  Lipid panel      Status: Abnormal   Collection Time: 03/02/19  5:25 AM  Result Value Ref Range   Cholesterol 170 0 - 200 mg/dL   Triglycerides 154 (H) <150 mg/dL   HDL 36 (L) >40 mg/dL   Total CHOL/HDL Ratio 4.7 RATIO   VLDL 31 0 - 40 mg/dL   LDL Cholesterol 103 (H) 0 - 99 mg/dL    Comment:        Total Cholesterol/HDL:CHD Risk Coronary Heart Disease Risk Table                     Men   Women  1/2 Average Risk   3.4   3.3  Average Risk       5.0   4.4  2 X Average Risk   9.6   7.1  3 X Average Risk  23.4   11.0        Use the calculated Patient Ratio above and the CHD Risk Table to determine the patient's CHD Risk.        ATP III CLASSIFICATION (LDL):  <100     mg/dL   Optimal  100-129  mg/dL   Near or Above                    Optimal  130-159  mg/dL   Borderline  160-189  mg/dL   High  >190     mg/dL   Very High Performed at [facility] [address], [place], [place] [address]   Basic metabolic panel     Status: Abnormal   Collection Time: 03/02/19  5:25 AM  Result Value Ref Range   Sodium 137 135 - 145 mmol/L   Potassium 3.5 3.5 - 5.1 mmol/L    Comment: DELTA CHECK NOTED   Chloride 97 (L) 98 - 111 mmol/L   CO2 26 22 - 32 mmol/L   Glucose, Bld 80 70 - 99 mg/dL   BUN 10 8 - 23 mg/dL   Creatinine, Ser 1.04 (H) 0.44 - 1.00 mg/dL   Calcium 7.9 (L) 8.9 - 10.3 mg/dL   GFR calc non Af Amer 54 (L) >60 mL/min   GFR calc Af Amer >60 >60 mL/min   Anion gap 14 5 - 15    Comment: Performed at [facility], [place] [address], [place], [place] [address]  CBC     Status: Abnormal   Collection Time: 03/02/19  5:25 AM  Result Value Ref Range   WBC 9.6  4.0 - 10.5 K/uL   RBC 6.07 (H) 3.87 - 5.11 MIL/uL   Hemoglobin 14.4 12.0 - 15.0 g/dL   HCT 53.7 (H) 36.0 - 46.0 %   MCV 88.5 80.0 - 100.0 fL   MCH 23.7 (L) 26.0 - 34.0 pg   MCHC 26.8 (L) 30.0 - 36.0 g/dL   RDW 24.4 (H) 11.5 - 15.5 %   Platelets 137 (L) 150 - 400 K/uL    Comment: REPEATED TO VERIFY   nRBC 0.0 0.0 - 0.2 %     Comment: Performed at [facility], [place] [address] [place], [place] [address]  Troponin I (High Sensitivity)     Status: Abnormal   Collection Time: 03/02/19  5:25 AM  Result Value Ref Range   Troponin I (High Sensitivity) 128 (HH) <18 ng/L    Comment: CRITICAL VALUE NOTED.  VALUE IS CONSISTENT WITH PREVIOUSLY REPORTED AND CALLED VALUE. (NOTE) Elevated high sensitivity troponin I (hsTnI) values and significant  changes across serial measurements may suggest ACS but many other  chronic and acute conditions are known to elevate hsTnI results.  Refer to the Links section for chest pain algorithms and additional  guidance. Performed at [facility], [place] [address], [place], [place] [address]   CBG monitoring, ED     Status: Abnormal   Collection Time: 03/02/19  6:44 AM  Result Value Ref Range   Glucose-Capillary 102 (H) 70 - 99 mg/dL   Dg Chest 2 View  Result Date: 03/01/2019 CLINICAL DATA:  Anterior chest pain post fall. EXAM: CHEST - 2 VIEW COMPARISON:  February 14, 2019 FINDINGS: Cardiomediastinal silhouette is normal. Mediastinal contours appear intact. Tortuosity of the aorta. There is no evidence of pleural effusion or pneumothorax. Linear opacities in the left mid thorax, possibly posttreatment or postsurgical changes. No radiographic evidence of displaced rib fractures. Soft tissues are grossly normal. IMPRESSION: 1. Linear opacities in the left mid thorax, possibly posttreatment or postsurgical changes. 2. No radiographic evidence of displaced rib fractures. Electronically Signed   By: Fidela Salisbury M.D.   On: 03/01/2019 14:48   Ct Head Wo Contrast  Result Date: 03/02/2019 CLINICAL DATA:  [place] out of bed. EXAM: CT HEAD WITHOUT CONTRAST TECHNIQUE: Contiguous axial images were obtained from the base of the skull through the vertex without intravenous contrast. COMPARISON:  MR brain dated Nov 22, 2013. FINDINGS: Brain: There is hypodensity and loss of the gray-white matter  differentiation predominantly involving the right temporal lobe, consistent with acute infarct. 8 x 8 x 10 mm area of hemorrhage in the right basal ganglia with surrounding edema. No midline shift, hydrocephalus, or extra-axial collection. Vascular: Atherosclerotic vascular calcification of the carotid siphons. No hyperdense vessel. Skull: Normal. Negative for fracture or focal lesion. Sinuses/Orbits: No acute finding. Mild ethmoid air cell mucosal thickening. Other: None. IMPRESSION: 1. Acute right MCA territory ischemic infarct. 10 mm hemorrhage in the right basal ganglia. No midline shift or hydrocephalus. Critical Value/emergent results were called by telephone at the time of interpretation on 03/02/2019 at 7:55 am to Dr. Debbe Odea, who verbally acknowledged these results. Electronically Signed   By: Titus Dubin M.D.   On: 03/02/2019 07:59   Ct Chest W Contrast  Result Date: 03/01/2019 CLINICAL DATA:  Patient status post fall out of bed at 6 a.m. this morning. Bilateral lower extremity weakness. EXAM: CT CHEST, ABDOMEN, AND PELVIS WITH CONTRAST TECHNIQUE: Multidetector CT imaging of the chest, abdomen and pelvis was performed following the standard protocol  during bolus administration of intravenous contrast. CONTRAST:  128mL OMNIPAQUE IOHEXOL 300 MG/ML  SOLN COMPARISON:  CT chest 11/03/2013.  PET CT scan 11/22/2013. FINDINGS: CT CHEST FINDINGS Cardiovascular: Pulmonary embolus are seen in right pulmonary arteries such as on image 54 of series 6 and 64-67 of series 6. Emboli are also seen in the aortic arch such as on images 21 and 22 of series 3 and 69 to 76 of series 7. Heart size is normal. No pleural or pericardial effusion. Calcific aortic and coronary atherosclerosis. Mediastinum/Nodes: No enlarged mediastinal, hilar, or axillary lymph nodes. Thyroid gland, trachea, and esophagus demonstrate no significant findings. Lungs/Pleura: Lungs are emphysematous. There is scar in the left upper lobe at the  site of patient's prior lung cancer. Mild dependent atelectasis is noted. Musculoskeletal: No acute or focal bony abnormality. CT ABDOMEN PELVIS FINDINGS Hepatobiliary: No focal liver abnormality is seen. No gallstones, gallbladder wall thickening, or biliary dilatation. The liver is low attenuating consistent with fatty infiltration. Pancreas: 0.4 cm hypoattenuating lesion at the junction of the tail and body on image 65 is likely due to some prior inflammatory process. No peripancreatic inflammatory change. No pancreatic duct dilatation. Spleen: Wedge-shaped hypoattenuation in the superior aspect of the spleen measuring 2.7 cm AP x 2.7 cm transverse by 2.0 cm craniocaudal is consistent with infarct. Spleen is otherwise unremarkable. Adrenals/Urinary Tract: The adrenal glands appear normal. Wedge-shaped area peripheral hypoattenuation in the mid to upper pole of the right kidney is compatible with an infarct. More subtle area of decreased cortical attenuation in the more superior aspect of the right kidney is also worrisome for infarct. Left kidney appears normal. Ureters and urinary bladder are unremarkable. Stomach/Bowel: Stomach is within normal limits. Appendix appears normal. No evidence of bowel wall thickening, distention, or inflammatory changes. Vascular/Lymphatic: Aortic atherosclerosis. No enlarged abdominal or pelvic lymph nodes. Reproductive: Uterus and bilateral adnexa are unremarkable. Other: None. Musculoskeletal: No acute or focal abnormality. Degenerative disc disease L5-S1 noted. 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 Findings raise the possibility for ventricular or atrial septal defect. No evidence of trauma. Fatty infiltration of the liver. Atherosclerosis. Emphysema. These results were called by telephone at the time of interpretation on 03/01/2019 at 7:29 pm to [facility], who verbally acknowledged these results.  Electronically Signed   By: Inge Rise M.D.   On: 03/01/2019 19:36   Ct Abdomen Pelvis W Contrast  Result Date: 03/01/2019 CLINICAL DATA:  Patient status post fall out of bed at 6 a.m. this morning. Bilateral lower extremity weakness. EXAM: CT CHEST, ABDOMEN, AND PELVIS WITH CONTRAST TECHNIQUE: Multidetector CT imaging of the chest, abdomen and pelvis was performed following the standard protocol during bolus administration of intravenous contrast. CONTRAST:  177mL OMNIPAQUE IOHEXOL 300 MG/ML  SOLN COMPARISON:  CT chest 11/03/2013.  PET CT scan 11/22/2013. FINDINGS: CT CHEST FINDINGS Cardiovascular: Pulmonary embolus are seen in right pulmonary arteries such as on image 86 of series 6 and 64-67 of series 6. Emboli are also seen in the aortic arch such as on images 21 and 22 of series 3 and 69 to 76 of series 7. Heart size is normal. No pleural or pericardial effusion. Calcific aortic and coronary atherosclerosis. Mediastinum/Nodes: No enlarged mediastinal, hilar, or axillary lymph nodes. Thyroid gland, trachea, and esophagus demonstrate no significant findings. Lungs/Pleura: Lungs are emphysematous. There is scar in the left upper lobe at the site of patient's prior lung cancer. Mild dependent atelectasis  is noted. Musculoskeletal: No acute or focal bony abnormality. CT ABDOMEN PELVIS FINDINGS Hepatobiliary: No focal liver abnormality is seen. No gallstones, gallbladder wall thickening, or biliary dilatation. The liver is low attenuating consistent with fatty infiltration. Pancreas: 0.4 cm hypoattenuating lesion at the junction of the tail and body on image 65 is likely due to some prior inflammatory process. No peripancreatic inflammatory change. No pancreatic duct dilatation. Spleen: Wedge-shaped hypoattenuation in the superior aspect of the spleen measuring 2.7 cm AP x 2.7 cm transverse by 2.0 cm craniocaudal is consistent with infarct. Spleen is otherwise unremarkable. Adrenals/Urinary Tract: The  adrenal glands appear normal. Wedge-shaped area peripheral hypoattenuation in the mid to upper pole of the right kidney is compatible with an infarct. More subtle area of decreased cortical attenuation in the more superior aspect of the right kidney is also worrisome for infarct. Left kidney appears normal. Ureters and urinary bladder are unremarkable. Stomach/Bowel: Stomach is within normal limits. Appendix appears normal. No evidence of bowel wall thickening, distention, or inflammatory changes. Vascular/Lymphatic: Aortic atherosclerosis. No enlarged abdominal or pelvic lymph nodes. Reproductive: Uterus and bilateral adnexa are unremarkable. Other: None. Musculoskeletal: No acute or focal abnormality. Degenerative disc disease L5-S1 noted. 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 Findings raise the possibility for ventricular or atrial septal defect. No evidence of trauma. Fatty infiltration of the liver. Atherosclerosis. Emphysema. These results were called by telephone at the time of interpretation on 03/01/2019 at 7:29 pm to [facility], who verbally acknowledged these results. Electronically Signed   By: Inge Rise M.D.   On: 03/01/2019 19:36   Dg Hips Bilat W Or Wo Pelvis 3-4 Views  Result Date: 03/01/2019 CLINICAL DATA:  Bilateral hip pain after fall today. EXAM: DG HIP (WITH OR WITHOUT PELVIS) 3-4V BILAT COMPARISON:  None. FINDINGS: There is no evidence of hip fracture or dislocation. There is no evidence of arthropathy or other focal bone abnormality. IMPRESSION: Negative. Electronically Signed   By: Marijo Conception M.D.   On: 03/01/2019 14:45   Vas US Carotid (at [facility] Only)  Result Date: 03/02/2019 Carotid Arterial Duplex Study Indications:       CVA. Risk Factors:      None. Limitations        Today's exam was limited due to the body habitus of the                    patient, patient movement, patient positioning  and the                    patient's respiratory variation. Comparison Study:  No prior studies. Performing Technologist: Oliver Hum RVT  Examination Guidelines: A complete evaluation includes B-mode imaging, spectral Doppler, color Doppler, and power Doppler as needed of all accessible portions of each vessel. Bilateral testing is considered an integral part of a complete examination. Limited examinations for reoccurring indications may be performed as noted.  Right Carotid Findings: +----------+--------+--------+--------+-----------------------+--------+           PSV cm/sEDV cm/sStenosisPlaque Description     Comments +----------+--------+--------+--------+-----------------------+--------+ CCA Prox  68      13              smooth and heterogenous         +----------+--------+--------+--------+-----------------------+--------+ CCA Distal65      11              smooth and heterogenous         +----------+--------+--------+--------+-----------------------+--------+  ICA Prox  58      14              smooth and heterogenoustortuous +----------+--------+--------+--------+-----------------------+--------+ ICA Distal57      22                                     tortuous +----------+--------+--------+--------+-----------------------+--------+ ECA       60      11                                              +----------+--------+--------+--------+-----------------------+--------+ +----------+--------+-------+--------+-------------------+           PSV cm/sEDV cmsDescribeArm Pressure (mmHG) +----------+--------+-------+--------+-------------------+ [id number]                                        +----------+--------+-------+--------+-------------------+ +---------+--------+--+--------+--+---------+ VertebralPSV cm/s73EDV cm/s24Antegrade +---------+--------+--+--------+--+---------+  Left Carotid Findings:  +----------+--------+--------+--------+-----------------------+--------+           PSV cm/sEDV cm/sStenosisPlaque Description     Comments +----------+--------+--------+--------+-----------------------+--------+ CCA Prox  101     19              smooth and heterogenous         +----------+--------+--------+--------+-----------------------+--------+ CCA Distal79      16              smooth and heterogenous         +----------+--------+--------+--------+-----------------------+--------+ ICA Prox  80      25              smooth and heterogenoustortuous +----------+--------+--------+--------+-----------------------+--------+ ICA Distal60      29                                     tortuous +----------+--------+--------+--------+-----------------------+--------+ ECA       66                                                      +----------+--------+--------+--------+-----------------------+--------+ +----------+--------+--------+--------+-------------------+           PSV cm/sEDV cm/sDescribeArm Pressure (mmHG) +----------+--------+--------+--------+-------------------+ [id number]                                         +----------+--------+--------+--------+-------------------+ +---------+--------+--+--------+--+---------+ VertebralPSV cm/s72EDV cm/s27Antegrade +---------+--------+--+--------+--+---------+  Summary: Right Carotid: Velocities in the right ICA are consistent with a 1-39% stenosis. Left Carotid: Velocities in the left ICA are consistent with a 1-39% stenosis. Vertebrals: Bilateral vertebral arteries demonstrate antegrade flow. *See table(s) above for measurements and observations.     Preliminary    Vas [name] Lower Extremity Venous (dvt)  Result Date: 03/02/2019  Lower Venous Study Indications: Pulmonary embolism, and stroke.  Risk Factors: None identified. Limitations: Body habitus, poor ultrasound/tissue interface and patient movement,  patient positioning. Comparison Study: No prior studies. Performing Technologist: Oliver Hum RVT  Examination Guidelines: A complete evaluation includes B-mode imaging, spectral Doppler, color Doppler, and power Doppler  as needed of all accessible portions of each vessel. Bilateral testing is considered an integral part of a complete examination. Limited examinations for reoccurring indications may be performed as noted.  +---------+---------------+---------+-----------+----------+--------------+ RIGHT    CompressibilityPhasicitySpontaneityPropertiesThrombus Aging +---------+---------------+---------+-----------+----------+--------------+ CFV      Full           Yes      Yes                                 +---------+---------------+---------+-----------+----------+--------------+ SFJ      Full                                                        +---------+---------------+---------+-----------+----------+--------------+ FV Prox  Full                                                        +---------+---------------+---------+-----------+----------+--------------+ FV Mid   Full                                                        +---------+---------------+---------+-----------+----------+--------------+ FV DistalFull                                                        +---------+---------------+---------+-----------+----------+--------------+ PFV      Full                                                        +---------+---------------+---------+-----------+----------+--------------+ POP      Full           Yes      Yes                                 +---------+---------------+---------+-----------+----------+--------------+ PTV      Full                                                        +---------+---------------+---------+-----------+----------+--------------+ PERO     Full                                                         +---------+---------------+---------+-----------+----------+--------------+   +---------+---------------+---------+-----------+----------+--------------+ LEFT     CompressibilityPhasicitySpontaneityPropertiesThrombus Aging +---------+---------------+---------+-----------+----------+--------------+ CFV  Full           Yes      Yes                                 +---------+---------------+---------+-----------+----------+--------------+ SFJ      Full                                                        +---------+---------------+---------+-----------+----------+--------------+ FV Prox  Full                                                        +---------+---------------+---------+-----------+----------+--------------+ FV Mid   Full                                                        +---------+---------------+---------+-----------+----------+--------------+ FV DistalFull                                                        +---------+---------------+---------+-----------+----------+--------------+ PFV      Full                                                        +---------+---------------+---------+-----------+----------+--------------+ POP      Full           Yes      Yes                                 +---------+---------------+---------+-----------+----------+--------------+ PTV      Full                                                        +---------+---------------+---------+-----------+----------+--------------+ PERO     Full                                                        +---------+---------------+---------+-----------+----------+--------------+     Summary: Right: There is no evidence of deep vein thrombosis in the lower extremity. No cystic structure found in the popliteal fossa. Left: There is no evidence of deep vein thrombosis in the lower extremity. No cystic structure found in the popliteal fossa.  *See  table(s) above for measurements and observations.    Preliminary  Pending Labs Unresulted Labs (From admission, onward)    Start     Ordered   03/03/19 0500  Hemoglobin A1c  Tomorrow morning,   R     03/02/19 0817   03/03/19 0500  Lipid panel  Tomorrow morning,   R    Comments: Fasting    03/02/19 0817   03/02/19 0500  CBC  Daily,   R     03/01/19 2041   03/02/19 0103  Culture, blood (Routine X 2) w Reflex to ID Panel  BLOOD CULTURE X 2,   R (with STAT occurrences)    Comments: Please obtain prior to antibiotic administration.    03/02/19 0104   03/01/19 2028  Protein C activity  (Hypercoagulable Panel, Comprehensive (PNL))  Once,   STAT     03/01/19 2027   03/01/19 2028  Protein C, total  (Hypercoagulable Panel, Comprehensive (PNL))  Once,   STAT     03/01/19 2027   03/01/19 2028  Protein S activity  (Hypercoagulable Panel, Comprehensive (PNL))  Once,   STAT     03/01/19 2027   03/01/19 2028  Protein S, total  (Hypercoagulable Panel, Comprehensive (PNL))  Once,   STAT     03/01/19 2027   03/01/19 2028  Lupus anticoagulant panel  (Hypercoagulable Panel, Comprehensive (PNL))  Once,   STAT     03/01/19 2027   03/01/19 2028  Beta-2-glycoprotein i abs, IgG/M/A  (Hypercoagulable Panel, Comprehensive (PNL))  Once,   STAT     03/01/19 2027   03/01/19 2028  Homocysteine, serum  (Hypercoagulable Panel, Comprehensive (PNL))  Once,   STAT     03/01/19 2027   03/01/19 2028  Factor 5 leiden  (Hypercoagulable Panel, Comprehensive (PNL))  Once,   STAT     03/01/19 2027   03/01/19 2028  Prothrombin gene mutation  (Hypercoagulable Panel, Comprehensive (PNL))  Once,   STAT     03/01/19 2027   03/01/19 2028  Cardiolipin antibodies, IgG, IgM, IgA  (Hypercoagulable Panel, Comprehensive (PNL))  Once,   STAT     03/01/19 2027   03/01/19 1319  Urine culture  ONCE - STAT,   STAT     03/01/19 1319          Vitals/Pain Today's Vitals   03/02/19 0930 03/02/19 1030 03/02/19 1045 03/02/19 1100   BP: (!) 114/58 118/63  (!) 114/94  Pulse:   86 81  Resp: 17     Temp:      TempSrc:      SpO2:   100% 100%  Weight:      Height:      PainSc:        Isolation Precautions No active isolations  Medications Medications  oxyCODONE (Oxy IR/ROXICODONE) immediate release tablet 5 mg (5 mg Oral Given 03/02/19 0745)  efavirenz-emtricitabine-tenofovir (ATRIPLA) 600-200-300 MG per tablet 1 tablet (1 tablet Oral Given 03/02/19 0051)  omega-3 acid ethyl esters (LOVAZA) capsule 1 g (1 g Oral Given 03/02/19 1008)  venlafaxine XR (EFFEXOR-XR) 24 hr capsule 150 mg (150 mg Oral Given 03/02/19 0904)  tiZANidine (ZANAFLEX) tablet 4 mg (4 mg Oral Given 03/02/19 1009)  vitamin C (ASCORBIC ACID) tablet 125 mg (125 mg Oral Given 03/02/19 1009)  albuterol (PROVENTIL) (2.5 MG/3ML) 0.083% nebulizer solution 3 mL (has no administration in time range)  mometasone-formoterol (DULERA) 200-5 MCG/ACT inhaler 2 puff (2 puffs Inhalation Not Given 03/02/19 0800)  loratadine (CLARITIN) tablet 10 mg (10 mg Oral Not Given 03/02/19 1010)  montelukast (  SINGULAIR) tablet 10 mg (10 mg Oral Given 03/02/19 0053)  umeclidinium bromide (INCRUSE ELLIPTA) 62.5 MCG/INH 2 puff (0 puffs Inhalation Hold 03/02/19 1025)  nicotine (NICODERM CQ - dosed in mg/24 hours) patch 21 mg (21 mg Transdermal Patch Applied 03/02/19 0903)  acetaminophen (TYLENOL) tablet 650 mg (650 mg Oral Given 03/02/19 1010)    Or  acetaminophen (TYLENOL) suppository 650 mg ( Rectal See Alternative 03/02/19 1010)  ondansetron (ZOFRAN) tablet 4 mg ( Oral See Alternative 03/02/19 0744)    Or  ondansetron (ZOFRAN) injection 4 mg (4 mg Intravenous Given 03/02/19 0744)  hydrALAZINE (APRESOLINE) injection 5 mg (has no administration in time range)  morphine 2 MG/ML injection 1 mg (1 mg Intravenous Given 03/02/19 0903)  calcium-vitamin D (OSCAL WITH D) 500-200 MG-UNIT per tablet 1 tablet (1 tablet Oral Given 03/02/19 1008)  vitamin A capsule 10,000 Units (10,000 Units Oral Given 03/02/19 1009)   gabapentin (NEURONTIN) capsule 600 mg (600 mg Oral Given 03/02/19 0903)   stroke: mapping our early stages of recovery book (0 each Does not apply Hold 03/02/19 1010)  fentaNYL (SUBLIMAZE) injection 50 mcg (50 mcg Intravenous Given 03/01/19 1548)  sodium chloride 0.9 % bolus 1,000 mL (0 mLs Intravenous Stopped 03/01/19 1709)  gabapentin (NEURONTIN) capsule 600 mg (600 mg Oral Given 03/01/19 1800)  iohexol (OMNIPAQUE) 300 MG/ML solution 80 mL (100 mLs Intravenous Contrast Given 03/01/19 1838)  heparin bolus via infusion 6,000 Units (6,000 Units Intravenous Bolus from Bag 03/01/19 2207)    Mobility  High fall risk   Focused Assessments   R Recommendations: See Admitting Provider Note  Report given to:   Additional Notes: [phone]

## 2019-03-02 NOTE — Consult Note (Addendum)
Neurology Consultation  Reason for Consult: Stroke Referring Physician: Rizwan   History is obtained from: Chart as patient is extremely drowsy  HPI: [name] is a 73 y.o. female with history of non-small cell cancer status post radiation therapy, neuromuscular disease, hyperlipidemia, HIV, hepatitis B, COPD and asthma.  Most of her history is obtained from the chart as patient is significantly drowsy, continues to fall asleep, and at times does not want to take part in exam.  Per chart patient has had generalized weakness in the past several days, patient is stating she has weakness in her right leg at this time.  2 days ago per chart patient accidentally fell off the bed, and slid to the floor.  She laid there for approximately 6 hours until she called her son per patient.  She did not call the son right away as she is legally blind and had a hard time finding the phone per patient.  Patient obtain CT of head which showed a right MCA territory ischemic infarct with 10 mm hemorrhage in the right basal ganglia.  Neurology was consulted.  LKW: 02/28/2019 with unknown exact time tpa given?: no, ICH Premorbid modified Rankin scale (mRS): 1 NIHSS 6   ROS: Unable to obtain secondary to drowsiness  Past Medical History:  Diagnosis Date  . Asthma   . Atypical squamous cells of undetermined significance (ASCUS) on Papanicolaou smear of cervix   . COPD (chronic obstructive pulmonary disease) ([place])   . H/O drainage of abscess    left axilla, from left breast  . Hep B w/o coma   . HIV (human immunodeficiency virus infection) ([place])   . Hyperlipidemia   . 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 Neuromuscular disorder ([place])    fingers/feet neuropathy  . Non-small cell lung cancer ([place])   . S/P radiation therapy 01/26/14-02/02/14   sbrt  lt upper lung-54Gy/101fx     Family History  Problem Relation Age of Onset  . Pneumonia Mother   . Hypertension Sister   . Diabetes Sister   .  Cancer Sister        pancreatic and lung  . Cancer Brother        pancreatic  . Cancer Brother        pancreatic   Social History:   reports that she has been smoking cigarettes. She has a 40.00 pack-year smoking history. She has never used smokeless tobacco. She reports that she does not drink alcohol or use drugs.  Medications  Current Facility-Administered Medications:  .   stroke: mapping our early stages of recovery book, , Does not apply, Once, Debbe Odea, MD, Stopped at 03/02/19 1010 .  acetaminophen (TYLENOL) tablet 650 mg, 650 mg, Oral, Q6H PRN, 650 mg at 03/02/19 1010 **OR** acetaminophen (TYLENOL) suppository 650 mg, 650 mg, Rectal, Q6H PRN, Ivor Costa, MD .  albuterol (PROVENTIL) (2.5 MG/3ML) 0.083% nebulizer solution 3 mL, 3 mL, Inhalation, Q6H PRN, Ivor Costa, MD .  calcium-vitamin D (OSCAL WITH D) 500-200 MG-UNIT per tablet 1 tablet, 1 tablet, Oral, Q breakfast, Debbe Odea, MD, 1 tablet at 03/02/19 1008 .  efavirenz-emtricitabine-tenofovir (ATRIPLA) 600-200-300 MG per tablet 1 tablet, 1 tablet, Oral, QHS, Ivor Costa, MD, 1 tablet at 03/02/19 0051 .  gabapentin (NEURONTIN) capsule 600 mg, 600 mg, Oral, TID, Rizwan, Saima, MD, 600 mg at 03/02/19 0903 .  hydrALAZINE (APRESOLINE) injection 5 mg, 5 mg, Intravenous, Q2H PRN, Ivor Costa, MD .  loratadine (CLARITIN) tablet 10 mg,  10 mg, Oral, BID, Ivor Costa, MD, 10 mg at 03/02/19 0745 .  mometasone-formoterol (DULERA) 200-5 MCG/ACT inhaler 2 puff, 2 puff, Inhalation, BID, Ivor Costa, MD .  montelukast (SINGULAIR) tablet 10 mg, 10 mg, Oral, QHS, Ivor Costa, MD, 10 mg at 03/02/19 0053 .  morphine 2 MG/ML injection 1 mg, 1 mg, Intravenous, Q4H PRN, Ivor Costa, MD, 1 mg at 03/02/19 0903 .  nicotine (NICODERM CQ - dosed in mg/24 hours) patch 21 mg, 21 mg, Transdermal, Daily, Ivor Costa, MD, 21 mg at 03/02/19 0903 .  omega-3 acid ethyl esters (LOVAZA) capsule 1 g, 1 g, Oral, Daily, Ivor Costa, MD, 1 g at 03/02/19 1008 .  ondansetron  (ZOFRAN) tablet 4 mg, 4 mg, Oral, Q6H PRN **OR** ondansetron (ZOFRAN) injection 4 mg, 4 mg, Intravenous, Q6H PRN, Ivor Costa, MD, 4 mg at 03/02/19 0744 .  oxyCODONE (Oxy IR/ROXICODONE) immediate release tablet 5 mg, 5 mg, Oral, Q6H PRN, Ivor Costa, MD, 5 mg at 03/02/19 0745 .  tiZANidine (ZANAFLEX) tablet 4 mg, 4 mg, Oral, BID, Ivor Costa, MD, 4 mg at 03/02/19 1009 .  umeclidinium bromide (INCRUSE ELLIPTA) 62.5 MCG/INH 2 puff, 2 puff, Inhalation, Daily, Ivor Costa, MD, Stopped at 03/02/19 1025 .  venlafaxine XR (EFFEXOR-XR) 24 hr capsule 150 mg, 150 mg, Oral, Q breakfast, Ivor Costa, MD, 150 mg at 03/02/19 0904 .  vitamin A capsule 10,000 Units, 10,000 Units, Oral, Daily, Debbe Odea, MD, 10,000 Units at 03/02/19 1009 .  vitamin C (ASCORBIC ACID) tablet 125 mg, 125 mg, Oral, Daily, Ivor Costa, MD, 125 mg at 03/02/19 1009  Current Outpatient Medications:  .  albuterol (PROVENTIL HFA;VENTOLIN HFA) 108 (90 BASE) MCG/ACT inhaler, Inhale 1 puff into the lungs every 6 (six) hours as needed for shortness of breath. , Disp: , Rfl:  .  albuterol (PROVENTIL) (2.5 MG/3ML) 0.083% nebulizer solution, Take 3 mLs (2.5 mg total) by nebulization every 4 (four) hours as needed for wheezing or shortness of breath., Disp: 30 mL, Rfl: 0 .  Ascorbic Acid (VITAMIN C) 100 MG tablet, Take 100 mg by mouth daily., Disp: , Rfl:  .  aspirin EC 81 MG tablet, Take 81 mg by mouth daily., Disp: , Rfl:  .  ATRIPLA 600-200-300 MG tablet, TAKE 1 TABLET BY MOUTH EVERY NIGHT AT BEDTIME (Patient taking differently: Take 1 tablet by mouth at bedtime. ), Disp: 30 tablet, Rfl: 1 .  B Complex-Biotin-FA (VITAMIN B50 COMPLEX PO), Take 1 tablet by mouth daily., Disp: , Rfl:  .  Calcium Carbonate-Vitamin D (CALCIUM 600+D) 600-400 MG-UNIT per tablet, Take 1 tablet by mouth daily., Disp: , Rfl:  .  Fluticasone-Salmeterol (ADVAIR) 500-50 MCG/DOSE AEPB, Inhale 1 puff into the lungs every 12 (twelve) hours., Disp: , Rfl:  .  gabapentin (NEURONTIN)  300 MG capsule, TAKE 2 CAPSULES BY MOUTH THREE TIMES A DAY (Patient taking differently: Take 600 mg by mouth 3 (three) times daily. ), Disp: 180 capsule, Rfl: 0 .  loratadine (CLARITIN) 10 MG tablet, Take 10 mg by mouth 2 (two) times daily. , Disp: , Rfl:  .  montelukast (SINGULAIR) 10 MG tablet, Take 1 tablet (10 mg total) by mouth at bedtime., Disp: 30 tablet, Rfl: 6 .  omega-3 acid ethyl esters (LOVAZA) 1 g capsule, Take 1 g by mouth daily., Disp: , Rfl:  .  predniSONE (DELTASONE) 20 MG tablet, Take 2 tablets (40 mg total) by mouth daily with breakfast., Disp: 3 tablet, Rfl: 0 .  SPIRIVA RESPIMAT 2.5 MCG/ACT AERS, INHALE  2 PUFFS INTO THE LUNGS DAILY (Patient taking differently: Inhale 2 puffs into the lungs daily. ), Disp: 1 Inhaler, Rfl: 0 .  tiZANidine (ZANAFLEX) 4 MG capsule, Take 4 mg by mouth 2 (two) times daily., Disp: , Rfl:  .  venlafaxine XR (EFFEXOR-XR) 150 MG 24 hr capsule, Take 150 mg by mouth daily with breakfast. , Disp: , Rfl:  .  VITAMIN A PO, Take 1 tablet by mouth daily., Disp: , Rfl:  .  ibuprofen (ADVIL,MOTRIN) 600 MG tablet, Take 1 tablet (600 mg total) by mouth every 8 (eight) hours as needed. (Patient not taking: Reported on 03/01/2019), Disp: 15 tablet, Rfl: 0 .  methocarbamol (ROBAXIN) 500 MG tablet, Take 1 tablet (500 mg total) by mouth every 6 (six) hours as needed for muscle spasms. (Patient not taking: Reported on 03/01/2019), Disp: 30 tablet, Rfl: 0   Exam: Current vital signs: BP (!) 114/58   Pulse (!) 108   Temp 99.6 F (37.6 C)   Resp 17   Ht 5\' 9"  (1.753 m)   Wt 100.7 kg   SpO2 93%   BMI 32.78 kg/m  Vital signs in last 24 hours: Temp:  [99.6 F (37.6 C)-99.8 F (37.7 C)] 99.6 F (37.6 C) (09/03 0645) Pulse Rate:  [90-108] 108 (09/02 1945) Resp:  [16-35] 17 (09/03 0930) BP: (107-154)/(44-129) 114/58 (09/03 0930) SpO2:  [84 %-98 %] 93 % (09/03 0910) Weight:  [100.7 kg] 100.7 kg (09/02 2000)  Physical Exam  Constitutional: Appears well-developed and  well-nourished.  Psych: Affect drowsy Eyes: No scleral injection HENT: No OP obstrucion Head: Normocephalic.  Cardiovascular: Normal rate and regular rhythm.  Respiratory: Effort normal, non-labored breathing GI: Soft.  No distension. There is no tenderness.  Skin: WDI  Neuro: Mental Status: Patient is awake, alert, oriented  place, believes it is August, oriented to year,  Patient is unable to give a clear and coherent history.  Cranial Nerves: II: Patient is legally blind however, does not appear to have any hemianopsia III,IV, VI: EOMI without ptosis or diploplia. Pupils equal, round and reactive to light- cataracts are clearly seen with exam in both eyes V: Patient has decreased sensation on the left  VII: Left facial droop.  VIII: hearing is intact to voice X: Palat elevates symmetrically XI: Shoulder shrug is symmetric. XII: tongue is midline without atrophy or fasciculations.  Motor: 5/5 on the right upper and lower extremity.  4/5 on the left upper and lower extremity.  Patient does have a dropfoot on the left which she states is new. Sensory: Decreased sensation on the left and also has neglect on the left to DSS Deep Tendon Reflexes: 1+ deep tendon reflexes bilateral upper extremities, no knee jerks no ankle jerk  plantars: Mute bilaterally Cerebellar: FNF shows no dysmetria heel-to-shin was extremely difficult she was unable to follow specific commands.  I did not see any dysmetria from what I could get her to do.   Labs I have reviewed labs in epic and the results pertinent to this consultation are:   CBC    Component Value Date/Time   WBC 9.6 03/02/2019 0525   RBC 6.07 (H) 03/02/2019 0525   HGB 14.4 03/02/2019 0525   HGB 15.0 12/28/2013 0920   HCT 53.7 (H) 03/02/2019 0525   HCT 47.6 (H) 12/28/2013 0920   PLT 137 (L) 03/02/2019 0525   PLT 240 12/28/2013 0920   MCV 88.5 03/02/2019 0525   MCV 90.5 12/28/2013 0920   MCH 23.7 (L) 03/02/2019 1751  MCHC  26.8 (L) 03/02/2019 0525   RDW 24.4 (H) 03/02/2019 0525   RDW 16.3 (H) 12/28/2013 0920   LYMPHSABS 3.0 03/01/2019 1330   LYMPHSABS 3.5 (H) 12/28/2013 0920   MONOABS 0.5 03/01/2019 1330   MONOABS 0.3 12/28/2013 0920   EOSABS 0.1 03/01/2019 1330   EOSABS 0.1 12/28/2013 0920   BASOSABS 0.0 03/01/2019 1330   BASOSABS 0.0 12/28/2013 0920    CMP     Component Value Date/Time   NA 137 03/02/2019 0525   NA 143 12/28/2013 0920   K 3.5 03/02/2019 0525   K 3.8 12/28/2013 0920   CL 97 (L) 03/02/2019 0525   CO2 26 03/02/2019 0525   CO2 28 12/28/2013 0920   GLUCOSE 80 03/02/2019 0525   GLUCOSE 148 (H) 12/28/2013 0920   BUN 10 03/02/2019 0525   BUN 14.5 12/28/2013 0920   CREATININE 1.04 (H) 03/02/2019 0525   CREATININE 1.10 05/02/2014 1600   CREATININE 1.4 (H) 12/28/2013 0920   CALCIUM 7.9 (L) 03/02/2019 0525   CALCIUM 9.2 12/28/2013 0920   PROT 7.6 03/01/2019 1330   PROT 7.9 12/28/2013 0920   ALBUMIN 3.2 (L) 03/01/2019 1330   ALBUMIN 3.8 12/28/2013 0920   AST 73 (H) 03/01/2019 1330   AST 17 12/28/2013 0920   ALT 25 03/01/2019 1330   ALT 16 12/28/2013 0920   ALKPHOS 66 03/01/2019 1330   ALKPHOS 76 12/28/2013 0920   BILITOT 2.0 (H) 03/01/2019 1330   BILITOT 0.24 12/28/2013 0920   GFRNONAA 54 (L) 03/02/2019 0525   GFRNONAA 52 (L) 05/02/2014 1600   GFRAA >60 03/02/2019 0525   GFRAA 60 05/02/2014 1600    Lipid Panel     Component Value Date/Time   CHOL 170 03/02/2019 0525   TRIG 154 (H) 03/02/2019 0525   HDL 36 (L) 03/02/2019 0525   CHOLHDL 4.7 03/02/2019 0525   VLDL 31 03/02/2019 0525   LDLCALC 103 (H) 03/02/2019 0525     Imaging I have reviewed the images obtained:  CT-scan of the brain- acute right MCA territory ischemic infarct with 10 mm hemorrhage in the right basal ganglia  Chest with contrast- pulmonary emboli and emboli in the aortic arch with right renal infarcts and splenic infarcts   Etta Quill PA-C [facility] [phone]  M-F  (9:00  am- 5:00 PM)  03/02/2019, 10:59 AM   I have seen the patient reviewed the above note.  Assessment: 73 year old female presented with embolic appearing infarcts in the right MCA territory.  She does have some hemorrhagic component to 1 of these infarcts, but it does not look hyperacute on CT.  It is also quite small.  Given the presence of pulmonary emboli (the potentially life-threatening process) as well as a prominent thrombus in the aortic arch, I think that the risk and benefit ratio of anticoagulation with Coumadin is slightly unclear.  Given the small size of the hemorrhage and the fact that I would favor it not being hyperacute, I do favor low-dose anticoagulation with heparin.  If her exam would worsen, then repeat stat head CT, otherwise repeat CT in the AM.   Her cancer was in 2015, with no known recurrance, but with both arterial and venous clots I do wonder about either shunt or hypercoagulability.   Recommendations: 1) Stroke protocol heparin 2) Q2H neuro checks overnight 3) LDL, A1c 4) PT, OT, ST 5) Stroke team to follow.   Roland Rack, MD [facility] [phone]  If 7pm- 7am, please page neurology on call  as listed in [place].

## 2019-03-02 NOTE — ED Notes (Signed)
Pt requesting more pain meds and her Neurontin.

## 2019-03-02 NOTE — Progress Notes (Signed)
ANTICOAGULATION CONSULT NOTE - Follow Up Consult  Pharmacy Consult for heparin Indication: pulmonary embolus  Labs: Recent Labs    03/01/19 1330 03/02/19 0209 03/02/19 0525  HGB 15.1* 14.2 14.4  HCT 55.0* 53.9* 53.7*  PLT 149* 142* 137*  HEPARINUNFRC  --   --  0.34  CREATININE 1.31*  --  1.04*  CKTOTAL 382*  --   --   TROPONINIHS  --  117* 128*    Assessment: 73yo female therapeutic on heparin with initial dosing though would prefer higher level w/ acute PE.  Goal of Therapy:  Heparin level 0.3-0.7 units/ml   Plan:  Will increase heparin gtt by ~1 unit/kg/hr to 1700 units/hr and check level in 6-8 hours.    Wynona Neat, PharmD, BCPS  03/02/2019,7:06 AM

## 2019-03-02 NOTE — Progress Notes (Signed)
  Speech Language Pathology Treatment: Cognitive-Linquistic  Patient Details Name: NECHA HARRIES MRN: [id number] DOB: [birth year] Today's Date: 03/02/2019 Time: 4163-8453 SLP Time Calculation (min) (ACUTE ONLY): 25 min  Assessment / Plan / Recommendation Clinical Impression  Pt was seen for cognitive-linguistic treatment and was cooperative throughout the session. She demonstrated 40% accuracy with a medication management (prescription) task increasing to 60% accuracy with mod cues. She achieved 25% with an abstract reasoning task increasing to 50% with mod-max cues. Max support was needed for sequencing during a mental manipulation task and max support was required for problem solving related to safety. Cues were required for sustained attention throughout all activities. SLP will continue to follow pt.    HPI HPI: Pt is a 73 y.o. 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 CT of the head revealed acute right MCA territory ischemic infarct. 10 mm hemorrhage in the right basal ganglia.      SLP Plan  Continue with current plan of care  Patient needs continued Speech Lanaguage Pathology Services    Recommendations                   Follow up Recommendations: Other (comment)(Continued SLP services at level of care recommended by PT/OT) SLP Visit Diagnosis: Cognitive communication deficit (M46.803) Plan: Continue with current plan of care       Tiffay Pinette I. Hardin Negus, [place], [place] Office number [phone] Pager [phone]                Horton Marshall 03/02/2019, 5:11 PM

## 2019-03-02 NOTE — ED Notes (Addendum)
Pt linens and brief changed. Pt still rates pain 10/10.

## 2019-03-03 ENCOUNTER — Inpatient Hospital Stay: Payer: Self-pay

## 2019-03-03 ENCOUNTER — Inpatient Hospital Stay (HOSPITAL_COMMUNITY): Payer: Medicare Other

## 2019-03-03 DIAGNOSIS — R531 Weakness: Secondary | ICD-10-CM

## 2019-03-03 DIAGNOSIS — Z515 Encounter for palliative care: Secondary | ICD-10-CM

## 2019-03-03 DIAGNOSIS — R4182 Altered mental status, unspecified: Secondary | ICD-10-CM

## 2019-03-03 DIAGNOSIS — J9622 Acute and chronic respiratory failure with hypercapnia: Secondary | ICD-10-CM

## 2019-03-03 DIAGNOSIS — I63511 Cerebral infarction due to unspecified occlusion or stenosis of right middle cerebral artery: Secondary | ICD-10-CM

## 2019-03-03 DIAGNOSIS — J9621 Acute and chronic respiratory failure with hypoxia: Secondary | ICD-10-CM

## 2019-03-03 DIAGNOSIS — J9601 Acute respiratory failure with hypoxia: Secondary | ICD-10-CM

## 2019-03-03 DIAGNOSIS — J9602 Acute respiratory failure with hypercapnia: Secondary | ICD-10-CM

## 2019-03-03 DIAGNOSIS — G9340 Encephalopathy, unspecified: Secondary | ICD-10-CM

## 2019-03-03 DIAGNOSIS — I7410 Embolism and thrombosis of unspecified parts of aorta: Secondary | ICD-10-CM

## 2019-03-03 DIAGNOSIS — I2699 Other pulmonary embolism without acute cor pulmonale: Secondary | ICD-10-CM

## 2019-03-03 LAB — BLOOD GAS, ARTERIAL
Acid-Base Excess: 4.1 mmol/L — ABNORMAL HIGH (ref 0.0–2.0)
Acid-Base Excess: 1.5 mmol/L (ref 0.0–2.0)
Acid-Base Excess: 1.3 mmol/L (ref 0.0–2.0)
Acid-Base Excess: 1.4 mmol/L (ref 0.0–2.0)
Acid-Base Excess: 3.8 mmol/L — ABNORMAL HIGH (ref 0.0–2.0)
Acid-Base Excess: 2.5 mmol/L — ABNORMAL HIGH (ref 0.0–2.0)
Acid-Base Excess: 2.1 mmol/L — ABNORMAL HIGH (ref 0.0–2.0)
Bicarbonate: 31.4 mmol/L — ABNORMAL HIGH (ref 20.0–28.0)
Bicarbonate: 28.7 mmol/L — ABNORMAL HIGH (ref 20.0–28.0)
Bicarbonate: 27.8 mmol/L (ref 20.0–28.0)
Bicarbonate: 28.4 mmol/L — ABNORMAL HIGH (ref 20.0–28.0)
Bicarbonate: 29.9 mmol/L — ABNORMAL HIGH (ref 20.0–28.0)
Bicarbonate: 28.5 mmol/L — ABNORMAL HIGH (ref 20.0–28.0)
Bicarbonate: 28.3 mmol/L — ABNORMAL HIGH (ref 20.0–28.0)
Delivery systems: POSITIVE
Delivery systems: POSITIVE
Delivery systems: POSITIVE
Delivery systems: POSITIVE
Drawn by: 252031
Drawn by: 237031
Drawn by: 237031
Drawn by: 560031
Drawn by: 55062
Drawn by: 55062
Expiratory PAP: 6
Expiratory PAP: 6
Expiratory PAP: 6
Expiratory PAP: 6
FIO2: 50
FIO2: 50
FIO2: 40
FIO2: 0.6
Inspiratory PAP: 14
Inspiratory PAP: 18
Inspiratory PAP: 20
Inspiratory PAP: 20
Mode: POSITIVE
Mode: POSITIVE
Mode: POSITIVE
O2 Content: 4 L/min
O2 Content: 4 L/min
O2 Saturation: 92.9 %
O2 Saturation: 96.5 %
O2 Saturation: 96.2 %
O2 Saturation: 93.6 %
O2 Saturation: 93.8 %
O2 Saturation: 99.1 %
O2 Saturation: 95.3 %
Patient temperature: 98.6
Patient temperature: 98.6
Patient temperature: 98.6
Patient temperature: 98.6
Patient temperature: 98.6
Patient temperature: 98.6
Patient temperature: 98.3
RATE: 10 resp/min
pCO2 arterial: 81.1 mmHg (ref 32.0–48.0)
pCO2 arterial: 73.9 mmHg (ref 32.0–48.0)
pCO2 arterial: 64.8 mmHg — ABNORMAL HIGH (ref 32.0–48.0)
pCO2 arterial: 71.9 mmHg (ref 32.0–48.0)
pCO2 arterial: 64.2 mmHg — ABNORMAL HIGH (ref 32.0–48.0)
pCO2 arterial: 61.5 mmHg — ABNORMAL HIGH (ref 32.0–48.0)
pCO2 arterial: 62.3 mmHg — ABNORMAL HIGH (ref 32.0–48.0)
pH, Arterial: 7.213 — ABNORMAL LOW (ref 7.350–7.450)
pH, Arterial: 7.213 — ABNORMAL LOW (ref 7.350–7.450)
pH, Arterial: 7.255 — ABNORMAL LOW (ref 7.350–7.450)
pH, Arterial: 7.22 — ABNORMAL LOW (ref 7.350–7.450)
pH, Arterial: 7.29 — ABNORMAL LOW (ref 7.350–7.450)
pH, Arterial: 7.288 — ABNORMAL LOW (ref 7.350–7.450)
pH, Arterial: 7.279 — ABNORMAL LOW (ref 7.350–7.450)
pO2, Arterial: 77.5 mmHg — ABNORMAL LOW (ref 83.0–108.0)
pO2, Arterial: 97.6 mmHg (ref 83.0–108.0)
pO2, Arterial: 90.9 mmHg (ref 83.0–108.0)
pO2, Arterial: 79.2 mmHg — ABNORMAL LOW (ref 83.0–108.0)
pO2, Arterial: 75 mmHg — ABNORMAL LOW (ref 83.0–108.0)
pO2, Arterial: 160 mmHg — ABNORMAL HIGH (ref 83.0–108.0)
pO2, Arterial: 83.5 mmHg (ref 83.0–108.0)

## 2019-03-03 LAB — HEMOGLOBIN A1C
Hgb A1c MFr Bld: 6.4 % — ABNORMAL HIGH (ref 4.8–5.6)
Mean Plasma Glucose: 136.98 mg/dL

## 2019-03-03 LAB — BASIC METABOLIC PANEL
Anion gap: 8 (ref 5–15)
BUN: 8 mg/dL (ref 8–23)
CO2: 29 mmol/L (ref 22–32)
Calcium: 8.2 mg/dL — ABNORMAL LOW (ref 8.9–10.3)
Chloride: 105 mmol/L (ref 98–111)
Creatinine, Ser: 0.86 mg/dL (ref 0.44–1.00)
GFR calc Af Amer: >60 mL/min (ref >60)
GFR calc non Af Amer: >60 mL/min (ref >60)
Glucose, Bld: 89 mg/dL (ref 70–99)
Potassium: 3.9 mmol/L (ref 3.5–5.1)
Sodium: 142 mmol/L (ref 135–145)

## 2019-03-03 LAB — BETA-2-GLYCOPROTEIN I ABS, IGG/M/A
Beta-2 Glyco I IgG: <9 GPI IgG units (ref 0–20)
Beta-2-Glycoprotein I IgA: <9 GPI IgA units (ref 0–25)
Beta-2-Glycoprotein I IgM: <9 GPI IgM units (ref 0–32)

## 2019-03-03 LAB — LACTIC ACID, PLASMA: Lactic Acid, Venous: 0.5 mmol/L (ref 0.5–1.9)

## 2019-03-03 LAB — CARDIOLIPIN ANTIBODIES, IGG, IGM, IGA
Anticardiolipin IgA: <9 APL U/mL (ref 0–11)
Anticardiolipin IgG: <9 GPL U/mL (ref 0–14)
Anticardiolipin IgM: <9 MPL U/mL (ref 0–12)

## 2019-03-03 LAB — LIPID PANEL
Cholesterol: 77 mg/dL (ref 0–200)
HDL: 15 mg/dL — ABNORMAL LOW (ref >40)
LDL Cholesterol: 48 mg/dL (ref 0–99)
Total CHOL/HDL Ratio: 5.1 RATIO
Triglycerides: 70 mg/dL (ref <150)
VLDL: 14 mg/dL (ref 0–40)

## 2019-03-03 LAB — URINE CULTURE: Culture: >=100000 — AB

## 2019-03-03 LAB — BLOOD GAS, VENOUS
Acid-base deficit: 9 mmol/L — ABNORMAL HIGH (ref 0.0–2.0)
Bicarbonate: 17.7 mmol/L — ABNORMAL LOW (ref 20.0–28.0)
O2 Content: 5 L/min
O2 Saturation: 68.5 %
Patient temperature: 98.6
pCO2, Ven: 47.9 mmHg (ref 44.0–60.0)
pH, Ven: 7.193 — CL (ref 7.250–7.430)
pO2, Ven: 42.3 mmHg (ref 32.0–45.0)

## 2019-03-03 LAB — HOMOCYSTEINE: Homocysteine: 8.7 umol/L (ref 0.0–19.2)

## 2019-03-03 LAB — GLUCOSE, CAPILLARY
Glucose-Capillary: 104 mg/dL — ABNORMAL HIGH (ref 70–99)
Glucose-Capillary: 98 mg/dL (ref 70–99)

## 2019-03-03 LAB — PROCALCITONIN: Procalcitonin: <0.1 ng/mL

## 2019-03-03 LAB — HEPARIN LEVEL (UNFRACTIONATED): Heparin Unfractionated: 0.18 IU/mL — ABNORMAL LOW (ref 0.30–0.70)

## 2019-03-03 LAB — PROTEIN C, TOTAL: Protein C, Total: 62 % (ref 60–150)

## 2019-03-03 MED ORDER — SODIUM CHLORIDE 0.9 % IV BOLUS: 250.0000 mL | Freq: Once | INTRAVENOUS | Status: DC | PRN

## 2019-03-03 MED ORDER — NALOXONE HCL 0.4 MG/ML IJ SOLN
INTRAMUSCULAR | Status: AC
  Administered 2019-03-03: 0.4 mg via INTRAVENOUS
  Filled 2019-03-03: qty 1

## 2019-03-03 MED ORDER — SODIUM CHLORIDE 0.9 % IV BOLUS
500.0000 mL | Freq: Once | INTRAVENOUS | Status: AC | PRN
  Administered 2019-03-03: 500 mL via INTRAVENOUS

## 2019-03-03 MED ORDER — METHYLPREDNISOLONE SODIUM SUCC 40 MG IJ SOLR
40.0000 mg | Freq: Two times a day (BID) | INTRAMUSCULAR | Status: DC
  Administered 2019-03-03 – 2019-03-04 (×2): 40 mg via INTRAVENOUS
  Filled 2019-03-03 (×2): qty 1

## 2019-03-03 MED ORDER — SODIUM CHLORIDE 0.9% FLUSH: 10.0000 mL | INTRAVENOUS | Status: DC | PRN

## 2019-03-03 MED ORDER — SODIUM CHLORIDE 0.9 % IV SOLN
INTRAVENOUS | Status: DC
  Administered 2019-03-03 – 2019-03-04 (×2): via INTRAVENOUS

## 2019-03-03 MED ORDER — NALOXONE HCL 0.4 MG/ML IJ SOLN
INTRAMUSCULAR | Status: AC
  Filled 2019-03-03: qty 1

## 2019-03-03 MED ORDER — SODIUM CHLORIDE 0.9 % IV BOLUS
1000.0000 mL | Freq: Once | INTRAVENOUS | Status: AC
  Administered 2019-03-03: 1000 mL via INTRAVENOUS

## 2019-03-03 MED ORDER — NALOXONE HCL 0.4 MG/ML IJ SOLN
0.4000 mg | INTRAMUSCULAR | Status: DC | PRN
  Administered 2019-03-03: 05:00:00 0.4 mg via INTRAVENOUS

## 2019-03-03 MED ORDER — SODIUM CHLORIDE 0.9% FLUSH
10.0000 mL | Freq: Two times a day (BID) | INTRAVENOUS | Status: DC
  Administered 2019-03-03 – 2019-03-10 (×13): 10 mL

## 2019-03-03 NOTE — Progress Notes (Signed)
Received order for PICC.  Attempted to obtain phone consent with no answer.  Will attempt again later.

## 2019-03-03 NOTE — Care Management Important Message (Signed)
Important Message  Patient Details  Name: [name] MRN: [id number] Date of Birth: 1946/06/05   Medicare Important Message Given:  Yes   Patient was not able to sign due to illness.  I signed a copy and left at Pt bedside/ Arber Wiemers 03/03/2019, 4:16 PM

## 2019-03-03 NOTE — Significant Event (Signed)
Rapid Response Event Note  Follow up  Assessment: Initially met patient this am at shift change about 7am while she was being placed on bipap. Repeat ABG 7.25/64/91/28 RT adjusted Bipap setting to 18/6 Lung sounds "tight"  Albuterol given Heart tones regular  Patient is still responsive to voice and moves all extremities but does not follow commands   Noted labs from 0730 still listed as "active" RN following up with lab for lab draw.  BP 97/40  SR 86  RR 17  O2 sat 96 Repeat BP 114/44  Dr [name] at bedside to assess patient: Plan restart Heparin gtt, recheck ABG in 2 hours, remain NPO until "alert"  Hold sedating medications     Plan of Care (if not transferred): Continue frequent monitoring and BIpap RN to call if patient becomes difficult to awake or if she becomes hypotensive.          Raliegh Ip

## 2019-03-03 NOTE — Progress Notes (Signed)
Dr. [name] paged to receive further consultation for the BIPAP or other respiratory orders as the patient's ABG's have continued to worsen. The patient's BIPAP has been increased to 20/6 with a FIO2 of 40%

## 2019-03-03 NOTE — Consult Note (Signed)
NAME:  [name], MRN:  [id number], DOB:  [birth year], LOS: 2 ADMISSION DATE:  03/01/2019, CONSULTATION DATE:  03/03/2019 REFERRING MD:  [name] CHIEF COMPLAINT:  AMS, respiratory insufficiency    Brief History   73yo F PMH HIV, adenocarcinoma of LUL s/p radiation therapy, HTN, HLD, COPD, asthma, HBV, tobacco abuse, CKD III admitted 9/2 after a fall with generalized weakness and CP. Diagnosed with pulmonary emboli and emboli in the aortic arch with right renal infarcts and a splenic infarct.  Subsequent CT of the head after admission due to presenting weakness and falls revealed acute right MCA territory ischemic infarct with 10 mm hemorrhagic conversion in the right basal ganglia.   9/4 0400 RRT called for altered mental status and obtundation.  Sent for head CT with no acute change.  The patient had received morphine overnight.  PCCM was contacted who recommended Narcan be given.  After that the patient would arouse but not follow commands.   PCCM subsequently consulted to bedside for altered mental status and hypotension.  The patient received IV fluids with improvement in blood pressure as well as improved mentation however this was intermittent.  ABG obtained revealed hypercarbia.  BiPAP ordered.   History of present illness   73yo F PMH HIV, adenocarcinoma of LUL s/p radiation therapy, HTN, HLD, COPD, chronic respiratory failure on 4 L/min oxygen at baseline, asthma, HBV, tobacco abuse, CKD III admitted 9/2 after a fall with generalized weakness and CP. Diagnosed with pulmonary emboli and emboli in the aortic arch with right renal infarcts and a splenic infarct.  Subsequent CT of the head after admission due to presenting weakness and falls revealed acute right MCA territory ischemic infarct with 10 mm hemorrhagic conversion in the right basal ganglia.   9/4 0400 RRT called for altered mental status and obtundation.  Sent for head CT with no acute change.  The patient had received morphine  overnight.  PCCM was contacted who recommended Narcan be given.  After that the patient would arouse but not follow commands.   PCCM subsequently consulted to bedside for altered mental status and hypotension and further recommendations.  The patient received IV fluids with improvement in blood pressure as well as improved mentation however this was intermittent.  ABG obtained revealed hypercarbia.  BiPAP ordered.   Of note the patient was admitted from 8/15-8/21/2020 for acute respiratory failure due to COPD and overmedications and her Robaxin, Gabapentin and Seroquel. She required a BiPAP. She was discharged home on 4 L O2 and a CPAP. Past Medical History  HIV adenocarcinoma of LUL s/p radiation therapy HTN HLD COPD, chronic respiratory failure on 4 L/min oxygen at baseline Asthma HBV tobacco abuse CKD III   Significant Hospital Events   9/4 RRT   Consults:  Neurology PCCM   Procedures:  NA  Significant Diagnostic Tests:  9/2 CTA Chest/abdomen/pelvis IMPRESSION: Pulmonary emboli and emboli in the aortic arch with right renal infarcts and a splenic infarct. Findings raise the possibility for ventricular or atrial septal defect. No evidence of trauma. Fatty infiltration of the liver. Atherosclerosis. Emphysema.  9/3 CT head IMPRESSION: 1. Acute right MCA territory ischemic infarct. 10 mm hemorrhage in the right basal ganglia. No midline shift or hydrocephalus.  9/4 CT head IMPRESSION: Evolving acute right cerebral infarcts primarily in the MCA territory. Unchanged 10 mm right basal ganglia hemorrhage.   9/3 Echo IMPRESSIONS  1. The left ventricle has hyperdynamic systolic function, with an ejection fraction of >65%. The cavity size was  normal. There is mildly increased left ventricular wall thickness. Left ventricular diastolic Doppler parameters are consistent with  impaired relaxation.  2. The right ventricle has normal systolic function. The cavity was  normal.  3. The mitral valve is abnormal. Mild thickening of the mitral valve leaflet.  4. The tricuspid valve is grossly normal.  5. The aortic valve is tricuspid. Mild calcification of the aortic valve. No stenosis of the aortic valve.  6. The aorta is normal unless otherwise noted.  7. Vigorous LV systolic function; mild LVH; grade 1 diastolic dysfunction; trace TR; moderate pulmonary hypertension.   9/3 Carotid duplex Summary: Right Carotid: Velocities in the right ICA are consistent with a 1-39% stenosis.  Left Carotid: Velocities in the left ICA are consistent with a 1-39% stenosis.  Vertebrals: Bilateral vertebral arteries demonstrate antegrade flow.  9/4 Lower EXT duplex Summary: Right: There is no evidence of deep vein thrombosis in the lower extremity. No cystic structure found in the popliteal fossa. Left: There is no evidence of deep vein thrombosis in the lower extremity. No cystic structure found in the popliteal fossa.     9/4 CXR  FINDINGS: Cardiac shadow is mildly enlarged but stable. Aortic calcifications are again seen. The lungs are well aerated bilaterally. Increased density in the lateral aspect of the right lung base is noted likely related to the patient's known pulmonary emboli. Irregularity of the fifth and sixth ribs on the left laterally is seen. These were not present on prior chest x-rays from 2016 and likely related to the patient's known history of prior lung carcinoma with mild healing. No acute fracture is seen.  IMPRESSION: Slight increase in parenchymal opacity in the right lung base laterally likely related to the known underlying pulmonary emboli.  Old deformities of the fifth and sixth ribs on the left likely related to the patient's known history of prior lung carcinoma. Micro Data:  SARS Coronavirus 2 negative  9/2 Urine>> Aerococcus species  9/3 Blood >> Antimicrobials:  Atripla>>chronic   Interim history/subjective:   Arouses to deep noxious stimuli, pushes away same purposefully.  Does not follow commands.  Currently being placed on BiPAP.  Objective   Blood pressure 125/60, pulse 83, temperature 98.4 F (36.9 C), temperature source Oral, resp. rate 18, height 5\' 9"  (1.753 m), weight 100.7 kg, SpO2 97 %.        Intake/Output Summary (Last 24 hours) at 03/03/2019 0720 Last data filed at 03/03/2019 5102 Gross per 24 hour  Intake 1998 ml  Output 600 ml  Net 1398 ml   Filed Weights   03/01/19 2000  Weight: 100.7 kg    Examination: General: Chronically ill-appearing, disheveled, lethargic HENT: Normocephalic.  Arcus senilis.  PERRL.  Dry mucus membranes Neck: No JVD. Trachea midline. No thyromegaly, no lymphadenopathy CV: RRR. S1S2. No MRG. +2 distal pulses Lungs: BBS present, shallow effort.  Nonlabored, symmetrical ABD: +BS x4. SNT/ND. No masses, guarding or rigidity GU: No Foley EXT: MAE well. No edema Skin: Pale, warm, dry.  Anterior surfaces in tact. No rashes or lesions Neuro: Withdraws to noxious stimuli, pulls away from tech attempting to obtain labs, opens eyes at same, does not follow commands     Resolved Hospital Problem list   NA  Assessment & Plan:  Acute hypercarbic respiratory failure in setting of known COPD and chronic hypoxic respiratory failure secondary to altered mental status. She does not appear to be in acute COPD exacerbation. P Trial BiPAP for now.  Patient is on home  CPAP chronically, was discharged with same at last hospitalization.  Hopefully with NIPPV we can avoid intubation however she remains high risk.  Palliative care has been consulted due to patient's multiple comorbidities and now acute findings of PE in setting of ischemic and hemorrhagic strokes.  Patient's family was contacted by night team and wishes for full code for now but will discuss amongst themselves for goals of care moving forward.   Acute ischemic and hemorrhagic stroke P Per neurology   Acute PE P Difficult picture given hemorrhagic conversion of stroke and need for anticoagulation.  Will defer to neurology and primary at this point as heparin drip was turned off with this acute event. Would possibly benefit from IVC filter however no lower extremity DVT noted. Hypercoagulopathy work-up per primary  Best practice:  Diet: N.p.o. for now Pain/Anxiety/Delirium protocol (if indicated): Avoid narcotics and sedating medications VAP protocol (if indicated): Not indicated DVT prophylaxis: SCDs GI prophylaxis: PPI Glucose control: Per primary Mobility: Bedrest Code Status: Full Family Communication: Updated Disposition: May stay in progressive care for now.  Will follow with you.  Labs   CBC: Recent Labs  Lab 03/01/19 1330 03/02/19 0209 03/02/19 0525  WBC 11.6* 10.3 9.6  NEUTROABS 8.0*  --   --   HGB 15.1* 14.2 14.4  HCT 55.0* 53.9* 53.7*  MCV 86.2 88.8 88.5  PLT 149* 142* 137*    Basic Metabolic Panel: Recent Labs  Lab 03/01/19 1330 03/02/19 0525  NA 140 137  K 5.0 3.5  CL 102 97*  CO2 23 26  GLUCOSE 89 80  BUN 13 10  CREATININE 1.31* 1.04*  CALCIUM 8.6* 7.9*   GFR: Estimated Creatinine Clearance: 62.7 mL/min (A) (by C-G formula based on SCr of 1.04 mg/dL (H)). Recent Labs  Lab 03/01/19 1330 03/02/19 0209 03/02/19 0525 03/03/19 0549  WBC 11.6* 10.3 9.6  --   LATICACIDVEN  --   --   --  0.5    Liver Function Tests: Recent Labs  Lab 03/01/19 1330  AST 73*  ALT 25  ALKPHOS 66  BILITOT 2.0*  PROT 7.6  ALBUMIN 3.2*   No results for input(s): LIPASE, AMYLASE in the last 168 hours. No results for input(s): AMMONIA in the last 168 hours.  ABG    Component Value Date/Time   PHART 7.213 (L) 03/03/2019 0705   PCO2ART 73.9 (HH) 03/03/2019 0705   PO2ART 97.6 03/03/2019 0705   HCO3 28.7 (H) 03/03/2019 0705   TCO2 35 (H) 02/12/2019 0224   ACIDBASEDEF 9.0 (H) 03/03/2019 0630   O2SAT 96.5 03/03/2019 0705     Coagulation Profile: No  results for input(s): INR, PROTIME in the last 168 hours.  Cardiac Enzymes: Recent Labs  Lab 03/01/19 1330  CKTOTAL 382*    HbA1C: Hgb A1c MFr Bld  Date/Time Value Ref Range Status  03/03/2019 05:00 AM 6.4 (H) 4.8 - 5.6 % Final    Comment:    (NOTE) Pre diabetes:          5.7%-6.4% Diabetes:              >6.4% Glycemic control for   <7.0% adults with diabetes   03/02/2019 05:25 AM 6.3 (H) 4.8 - 5.6 % Final    Comment:    (NOTE) Pre diabetes:          5.7%-6.4% Diabetes:              >6.4% Glycemic control for   <7.0% adults with diabetes  CBG: Recent Labs  Lab 03/02/19 0644 03/03/19 0358 03/03/19 0546  GLUCAP 102* 104* 98    Review of Systems:   Unable to obtain as patient has altered mental status and obtundation.  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   Surgical History    Past Surgical History:  Procedure Laterality Date  . ABCESS DRAINAGE     abcess under Left arm, abcess from L breast  . removal of abnormal cells on uterus    . thyroid gland removed       Social History   reports that she has been smoking cigarettes. She has a 40.00 pack-year smoking history. She has never used smokeless tobacco. She reports that she does not drink alcohol or use drugs.   Family History   Her family history includes Cancer in her brother, brother, and sister; Diabetes in her sister; Hypertension in her sister; Pneumonia in her mother.   Allergies No Known Allergies   Home Medications  Prior to Admission medications   Medication Sig Start Date End  Date Taking? Authorizing Provider  albuterol (PROVENTIL HFA;VENTOLIN HFA) 108 (90 BASE) MCG/ACT inhaler Inhale 1 puff into the lungs every 6 (six) hours as needed for shortness of breath.    Yes [provider]  albuterol (PROVENTIL) (2.5 MG/3ML) 0.083% nebulizer solution Take 3 mLs (2.5 mg total) by nebulization every 4 (four) hours as needed for wheezing or shortness of breath. 01/18/19  Yes Jaynee Eagles, PA-C  Ascorbic Acid (VITAMIN C) 100 MG tablet Take 100 mg by mouth daily.   Yes [provider]  aspirin EC 81 MG tablet Take 81 mg by mouth daily.   Yes [provider]  ATRIPLA 600-200-300 MG tablet TAKE 1 TABLET BY MOUTH EVERY NIGHT AT BEDTIME Patient taking differently: Take 1 tablet by mouth at bedtime.  09/03/15  Yes Tommy Medal, Lavell Islam, MD  B Complex-Biotin-FA (VITAMIN B50 COMPLEX PO) Take 1 tablet by mouth daily.   Yes [provider]  Calcium Carbonate-Vitamin D (CALCIUM 600+D) 600-400 MG-UNIT per tablet Take 1 tablet by mouth daily.   Yes [provider]  Fluticasone-Salmeterol (ADVAIR) 500-50 MCG/DOSE AEPB Inhale 1 puff into the lungs every 12 (twelve) hours.   Yes [provider]  gabapentin (NEURONTIN) 300 MG capsule TAKE 2 CAPSULES BY MOUTH THREE TIMES A DAY Patient taking differently: Take 600 mg by mouth 3 (three) times daily.  01/14/15  Yes Tommy Medal, Lavell Islam, MD  loratadine (CLARITIN) 10 MG tablet Take 10 mg by mouth 2 (two) times daily.    Yes [provider]  montelukast (SINGULAIR) 10 MG tablet Take 1 tablet (10 mg total) by mouth at bedtime. 12/02/12  Yes Michel Bickers, MD  omega-3 acid ethyl esters (LOVAZA) 1 g capsule Take 1 g by mouth daily.   Yes [provider]  predniSONE (DELTASONE) 20 MG tablet Take 2 tablets (40 mg total) by mouth daily with breakfast. 02/17/19  Yes Oretha Milch D, MD  SPIRIVA RESPIMAT 2.5 MCG/ACT AERS INHALE 2 PUFFS INTO THE LUNGS DAILY Patient taking differently: Inhale 2 puffs  into the lungs daily.  09/24/14  Yes Rigoberto Noel, MD  tiZANidine (ZANAFLEX) 4 MG capsule Take 4 mg by mouth 2 (two) times daily.   Yes [provider]  venlafaxine XR (EFFEXOR-XR) 150 MG 24 hr capsule Take 150 mg by mouth daily with breakfast.    Yes [provider]  VITAMIN A PO Take 1 tablet by mouth daily.   Yes [provider]  ibuprofen (ADVIL,MOTRIN) 600 MG tablet Take 1 tablet (600 mg total) by mouth every 8 (eight) hours as needed. Patient not taking: Reported on 03/01/2019 12/14/14   Jola Schmidt, MD  methocarbamol (ROBAXIN) 500 MG tablet Take 1 tablet (500 mg total) by mouth every 6 (six) hours as needed for muscle spasms. Patient not taking: Reported on 03/01/2019 09/15/14   [name], MD     I spent 45 minutes in direct patient care including reviewing data,  discussing with other providers, assessment, planning and stabilization and documentation. Time is exclusive to this patient and does not include procedures.

## 2019-03-03 NOTE — Progress Notes (Signed)
ANTICOAGULATION CONSULT NOTE  Pharmacy Consult:  Heparin Indication: pulmonary embolus  No Known Allergies  Patient Measurements: Height: 5\' 9"  (175.3 cm) Weight: 222 lb 0.1 oz (100.7 kg) IBW/kg (Calculated) : 66.2 Heparin Dosing Weight: 88 kg  Vital Signs: Temp: 98.8 F (37.1 C) (09/04 0824) Temp Source: Axillary (09/04 0824) BP: 118/58 (09/04 0824) Pulse Rate: 84 (09/04 0824)  Labs: Recent Labs    03/01/19 1330 03/02/19 0209 03/02/19 0525  HGB 15.1* 14.2 14.4  HCT 55.0* 53.9* 53.7*  PLT 149* 142* 137*  HEPARINUNFRC  --   --  0.34  CREATININE 1.31*  --  1.04*  CKTOTAL 382*  --   --   TROPONINIHS  --  117* 128*    Estimated Creatinine Clearance: 62.7 mL/min (A) (by C-G formula based on SCr of 1.04 mg/dL (H)).     Assessment: 76 YOF presented s/p fall (slid to floor from bed), hip pain. 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 Pharmacy consulted to dose heparin for PE.  Heparin was turned off 03/02/19 PM d/t concern for stroke with hemorrhagic conversion. Neuro believes hemorrhagic component is older and have ordered to restart IV heparin.  Early this morning, patient had AMS with hypotension, thought due to narcotic since patient responded to Narcan and IVF.  Heparin was turned off again while awaiting repeat head CT.  CT is stable with unchanged hemorrhage.  Spoke to Dr. [name], okay to resume IV heparin.  Goal of Therapy:  Heparin level 0.3 - 0.5 units/ml Monitor platelets by anticoagulation protocol: Yes   Plan:  Resume heparin gtt at 1600 units/hr, no bolus Check 8 hr heparin level Daily heparin level and CBC  Khylin Gutridge D. Mina Marble, PharmD, BCPS, Okay 03/03/2019, 8:49 AM

## 2019-03-03 NOTE — Progress Notes (Signed)
Patient with right MCA ischemic stroke and small right basal ganglia hemorrhage on anticoagulation with heparin drip for PE.  Was called by rapid response nurse around 4 AM for sudden mental status change: Patient obtunded, NIH stroke scale 26 and blood pressure in the 50s with agonal breathing. According to the patient's bedside nurse, last seen normal was 12 AM when she had her neurochecks and was doing fine.  At 10:45 PM patient received narcotics for leg pain.  Around 3:45 AM when she went to check up on the patient, patient was lethargic and not able to be aroused. Advised to get stat CT head and call CCM.  When I went to assess the patient, on giving strong blood pressure she was arousable, would groan but not following commands.  ABG showed PCO2 of 80..  As patient was more responsive CCM decided to hold off intubation, and recommended BiPAP until completion of CT.   Stat CT head was performed which showed no new hemorrhagic conversion.  Advised to rapid response to call hospitalist for management of hypercarbia/respiratory issues.  Avoid all narcotics.

## 2019-03-03 NOTE — Consult Note (Addendum)
Consultation Note Date: 03/03/2019   Patient Name: [name]  DOB: 10/28/1945  MRN: [id number]  Age / Sex: 73 y.o., female  PCP: System, Provider Not In Referring Physician: Debbe Odea, MD  Reason for Consultation: Establishing goals of care and Psychosocial/spiritual support   ADDENDUM:  I spoke with the patient's decision making surrogate, her brother [name] for 1 hour this afternoon.   Michelle explained gave me [name]'s history.  She last worked when she was 54 - 61 yrs old.  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  She found the [employer] in an [employer] and now has been saved.  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  She was living independently for the last two months in [place] with her sister.  He is uncertain of her ability with her ADLs.  Michelle stated that [name] told him she would not quit smoking because she felt better when she smoked.  He also felt certain that she had had some Oxycodone recently - and likely had some level of regular narcotic use.  Michelle and [name] are two of 11 siblings.  5 are left.  [name] is number 6.  Michelle and his family have had at least 8 deaths in the last 83 years.  Michelle has been the default decision maker for the last 4 of those deaths including his brother who died here 3 months ago.   He had another brother die here 3 years ago.  After the doctor explained to the family that there was nothing more to be done the family agreed to compassionate extubation.    Michelle explained that while he is the decision maker he will get input from his siblings.  He felt that they will want to take every option available to extend her life.  We talked about code status and for now she remains a full code.  Michelle felt that [name] would want to explore prolonging her life as she has found the [facility] and "All things are possible".    Michelle and I agreed that we will  talk again tomorrow after he has had a chance to speak with his siblings.     HPI/Patient Profile: 73 y.o. female  with past medical history of LUL NSCL CA s/p radiation (2015), COPD, CKD 3, and HIV who was admitted on 03/01/2019 after a fall with weakness and chest pain.  She was found to have multiple clots causing a PE, right MCA CVA, renal infarct and splenic infarct. 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  Currently the patient is hypercapnic and on bipap.    Clinical Assessment and Goals of Care:  I have reviewed medical records including EPIC notes, labs and imaging, received report from the care team and respiratory therapy, assessed the patient and then attempted to call her family to discuss diagnosis prognosis, GOC, EOL wishes, disposition and options.  Her sister [name] answered my call and re-directed  me towards her brother [name].  I have called Michelle and left a message requesting a call back.   Primary Decision Maker:  NEXT OF KIN Brother [name].    SUMMARY OF RECOMMENDATIONS    PMT will continue to follow with you. I anticipate a family conference in the hospital in the next day or two.  Code Status/Advance Care Planning:  Full   Symptom Management:   Per primary team.  Additional Recommendations (Limitations, Scope, Preferences):  Full Scope Treatment  Palliative Prophylaxis:   Frequent Pain Assessment  Psycho-social/Spiritual:   Desire for further Chaplaincy support:  Welcomed.  Chaplain [name] accompanied me today.  Prognosis:  Very concerning.  Given her hypercoagulable state and hemorrhagic conversion she is at extremely high risk of acute decline and death.    Discharge Planning: To Be Determined. Concerned for possible hospital death.      Primary Diagnoses: Present on Admission: . Human immunodeficiency virus (HIV) disease ([place]) . Hypertension . (Resolved) Lung cancer ([place]) . Non-small cell lung  cancer ([place]) . Cigarette smoker . COPD (chronic obstructive pulmonary disease) ([place]) . PE (pulmonary thromboembolism) ([place]) . Tobacco abuse . 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 CKD (chronic kidney disease), stage III ([place])   I have reviewed the medical record, interviewed the patient and family, and examined the patient. The following aspects are pertinent.  Past Medical History:  Diagnosis Date  . Asthma   . Atypical squamous cells of undetermined significance (ASCUS) on Papanicolaou smear of cervix   . COPD (chronic obstructive pulmonary disease) ([place])   . H/O drainage of abscess    left axilla, from left breast  . Hep B w/o coma   . HIV (human immunodeficiency virus infection) ([place])   . Hyperlipidemia   . Lung cancer ([place]) 12/14/13   LUL Adenocarcinoma  . Neuromuscular disorder ([place])    fingers/feet neuropathy  . Non-small cell lung cancer ([place])   . S/P radiation therapy 01/26/14-02/02/14   sbrt  lt upper lung-54Gy/59fx   Social History   Socioeconomic History  . Marital status: Single    Spouse name: Not on file  . Number of children: Not on file  . Years of education: Not on file  . Highest education level: Not on file  Occupational History  . Not on file  Social Needs  . Financial resource strain: Not on file  . Food insecurity    Worry: Not on file    Inability: Not on file  . Transportation needs    Medical: Not on file    Non-medical: Not on file  Tobacco Use  . Smoking status: Current Every Day Smoker    Packs/day: 1.00    Years: 40.00    Pack years: 40.00    Types: Cigarettes  . Smokeless tobacco: Never Used  . Tobacco comment: quit date the day she starts her radiation at the [facility]  Substance and Sexual Activity  . Alcohol use: No    Alcohol/week: 0.0 standard drinks    Comment: none in 20 years  . Drug use: No    Comment: none in 20 years  . Sexual activity: Not Currently    Partners: Male    Comment: given condoms  Lifestyle  . Physical activity     Days per week: Not on file    Minutes per session: Not on file  . Stress: Not on file  Relationships  . Social connections    Talks on phone: Not on file  Gets together: Not on file    Attends religious service: Not on file    Active member of club or organization: Not on file    Attends meetings of clubs or organizations: Not on file    Relationship status: Not on file  Other Topics Concern  . Not on file  Social History Narrative  . Not on file   Family History  Problem Relation Age of Onset  . Pneumonia Mother   . Hypertension Sister   . Diabetes Sister   . Cancer Sister        pancreatic and lung  . Cancer Brother        pancreatic  . Cancer Brother        pancreatic   Scheduled Meds: .  stroke: mapping our early stages of recovery book   Does not apply Once  . calcium-vitamin D  1 tablet Oral Q breakfast  . efavirenz-emtricitabine-tenofovir  1 tablet Oral QHS  . gabapentin  600 mg Oral TID  . loratadine  10 mg Oral BID  . mometasone-formoterol  2 puff Inhalation BID  . montelukast  10 mg Oral QHS  . naloxone      . nicotine  21 mg Transdermal Daily  . omega-3 acid ethyl esters  1 g Oral Daily  . sodium chloride flush  10-40 mL Intracatheter Q12H  . tiZANidine  4 mg Oral BID  . umeclidinium bromide  2 puff Inhalation Daily  . venlafaxine XR  150 mg Oral Q breakfast  . vitamin A  10,000 Units Oral Daily  . vitamin C  125 mg Oral Daily   Continuous Infusions: . heparin 1,600 Units/hr (03/03/19 0936)   PRN Meds:.acetaminophen **OR** acetaminophen, albuterol, naLOXone (NARCAN)  injection, ondansetron **OR** ondansetron (ZOFRAN) IV, sodium chloride flush No Known Allergies Review of Systems patient on bipap and non verbal.  Physical Exam  Well developed female, lethargic, on bipap CV rrr resp on bipap no increased work of breathing or acute distress Abdomen obese, soft, nt, nd Lower ext no edema Skin well healed lesions noted on arms.  Vital Signs: BP  124/64 (BP Location: Left Arm)   Pulse 82   Temp 97.8 F (36.6 C) (Axillary)   Resp 15   Ht 5\' 9"  (1.753 m)   Wt 100.7 kg   SpO2 98%   BMI 32.78 kg/m  Pain Scale: 0-10   Pain Score: Asleep   SpO2: SpO2: 98 % O2 Device:SpO2: 98 % O2 Flow Rate: .O2 Flow Rate (L/min): 4 L/min  IO: Intake/output summary:   Intake/Output Summary (Last 24 hours) at 03/03/2019 1401 Last data filed at 03/03/2019 0552 Gross per 24 hour  Intake 1998 ml  Output 600 ml  Net 1398 ml    LBM: Last BM Date: 02/27/19 Baseline Weight: Weight: 100.7 kg Most recent weight: Weight: 100.7 kg     Palliative Assessment/Data: 10%     Time In: 1:00 Time Out: 1:50 Time in 2:30 Time out 3:30 Time Total: 110 min. Visit consisted of counseling and education dealing with the complex and emotionally intense issues surrounding the need for palliative care and symptom management in the setting of serious and potentially life-threatening illness. Greater than 50%  of this time was spent counseling and coordinating care related to the above assessment and plan.  Signed by: Florentina Jenny, PA-C Palliative Medicine Pager: [phone]  Please contact Palliative Medicine Team phone at [phone] for questions and concerns.  For individual provider: See Shea Evans

## 2019-03-03 NOTE — Plan of Care (Signed)
AMS overnight, better with narcan but still hypercapneic. Arousable with stimulation. Will do a short-term trial of BiPAP. Would avoid additional opiates. Discussed with Dr. [name]. Full consult note to follow.  Julian Hy, DO 03/03/19 7:28 AM [facility]

## 2019-03-03 NOTE — Progress Notes (Signed)
Patient arrived to the unit from ED alert and oriented X 4 C/o Pain in her legs and ankles, Nurse will treat with ordered PRN Came up on 4 liters of oxygen Skin clean dry and Intact, Dry flaky skin around the ankles SCD place Placed on tele box 7 All questions and concerns addressed Bed in the lowest position with bed alarm on. Call light in reach. Nurse will continue to monitor

## 2019-03-03 NOTE — Progress Notes (Signed)
Pt observed to be very agitated, pulled out her BIPAP and climbing out of bed, Rapid Response RN Mindy called, came up to re-evaluate pt, pt put on oxygen at [place] at 89-92%, pt had order for bilateral wrist restraints, commenced at 2230 as pt was still  attempting to remove the o2 canula, pt to have blood gas drawn at midnight as ordered by the on call NP Blount, will however continue to monitor. [name], [name]

## 2019-03-03 NOTE — Progress Notes (Signed)
PT Cancellation Note  Patient Details Name: [name] MRN: [id number] DOB: [birth year]   Cancelled Treatment:    Reason Eval/Treat Not Completed: Medical issues which prohibited therapy.  Noted rapid response interventions today.  RN asked to hold for the day.  Will see as able 9/5. 03/03/2019  Donnella Sham, PT Acute Rehabilitation Services [phone]  (pager) [phone]  (office)    Tessie Fass Itha Kroeker 03/03/2019, 1:04 PM

## 2019-03-03 NOTE — Progress Notes (Signed)
OT Cancellation Note  Patient Details Name: [name] MRN: [id number] DOB: 02-23-1946   Cancelled Treatment:    Reason Eval/Treat Not Completed: Patient not medically ready(Pt with multiple RR events and hold OT eval to start 9/5.)   Darryl Nestle) Marsa Aris OTR/L Acute Rehabilitation Services Pager: [phone] Office: [place] 03/03/2019, 1:52 PM

## 2019-03-03 NOTE — Progress Notes (Signed)
SLP Cancellation Note  Patient Details Name: [name] MRN: [id number] DOB: 23-Apr-1946   Cancelled treatment:       Reason Eval/Treat Not Completed: Medical issues which prohibited therapy(Pt had rapid response event today and is currently on BiPAP. SLP will defer treatment and follow up on subsequent date.)  [name] I. Hardin Negus, [place], [place] Office number [phone] Pager [phone]  Horton Marshall 03/03/2019, 3:50 PM

## 2019-03-03 NOTE — Progress Notes (Addendum)
PROGRESS NOTE    [name]   FBP:[id number]  DOB: 1946-04-02  DOA: 03/01/2019 PCP: System, Provider Not In   Brief Narrative:  [name] is a 73 y.o. 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 Patient had noted generalized weakness and leg pain for a number of days and then fell out of the bed around 6 AM and laid on the floor until her son found her about 6 hours later.  She was too weak to ambulate.  She noted right-sided lower rib pain after the fall.  The patient underwent a CT of the chest abdomen pelvis in the ED.  Results showed pulmonary emboli and emboli in the aortic arch with a right renal infarcts and a splenic infarct.  Also noted is a fatty liver and emphysema. She was started on a heparin infusion.  She subsequently had a CT scan of her head which was ordered by the admitting doctor and showed acute right MCA territory ischemic infarct. 10 mm hemorrhage in the right basal ganglia  She was recently admitted from 8/15-8/21 for acute respiratory failure due to COPD and overmedications and her Robaxin, Gabapentin and Seroquel. She required a BiPAP. She was discharged home on 4 L O2 and a CPAP.  Subjective: Overnight, she was found to be lethargic, ABG showed a pH of 7.21 and pCO2 81.1. She was started on a BiPAP, given narcan and had a head CT. Head CT was unchanged.     Assessment & Plan:   Principal Problem: Acute hypercapnic respiratory failure - may be due to narcotics, progression of CVA in setting of PEs or COPD flare - PCO2 is improving slowing on the BiPAP- we will keep her on BiPAP if needed overnight and keep following serial ABGs every 4 hrs - will add low dose solumedrol   - holding all oral meds for now - NPO- start IVF  CVA (cerebral  vascular accident)  - left facial droop, mild weakness on left side with subjective decreased sensation - She may have a PFO and CVA may have been as a result of the aortic thrombus breaking into emboli which traveled through the PFO and up to her brain -Her 2D echo does not show a PFO - Neurology will be following and giving further input    PE (pulmonary thromboembolism) -acute respiratory failure - on 9/3, in ED,  patient was quite hypoxic and requiring 5 to 6 L of oxygen - Bilateral lower extremity venous duplex is negative for DVT - cont Heparin infusion  DM 2 - Hb A1c 6.4    Aortic thrombus  - cont Heparin infusion    Human immunodeficiency virus (HIV) disease  -Last CD4 count was 406 on 02/13/2019 -Continue Atripla  Neuropathy in legs -  holding gabapentin and all oral medications     Cigarette smoker - COPD (chronic obstructive pulmonary disease)   - advised to stop smoking - no COPD exacerbation at this time    Non-small cell lung cancer   - s/p radiation    CKD (chronic kidney disease), stage II- III    - stable   Time spent in minutes: 35 DVT prophylaxis: SCDs Code Status: Full code- brother requests full code for now Family Communication: discussed plan in detail with brother Philana Younis Disposition Plan: cont to follow in hospital-  will ask for a palliative care consult Consultants:   Neuro Procedures:   2D echo 1. The left ventricle has hyperdynamic systolic function, with an ejection fraction of >65%. The cavity size was normal. There is mildly increased left ventricular wall thickness. Left ventricular diastolic Doppler parameters are consistent with impaired relaxation. 2. The right ventricle has normal systolic function. The cavity was normal. 3. The mitral valve is abnormal. Mild thickening of the mitral valve leaflet. 4. The tricuspid valve is grossly normal. 5. The aortic valve is tricuspid. Mild calcification of the aortic valve. No  stenosis of the aortic valve. 6. The aorta is normal unless otherwise noted. 7. Vigorous LV systolic function; mild LVH; grade 1 diastolic dysfunction; trace TR; moderate pulmonary hypertension Antimicrobials:  Anti-infectives (From admission, onward)   Start     Dose/Rate Route Frequency Ordered Stop   03/01/19 2215  efavirenz-emtricitabine-tenofovir (ATRIPLA) 600-200-300 MG per tablet 1 tablet     1 tablet Oral Daily at bedtime 03/01/19 2206         Objective: Vitals:   03/03/19 1154 03/03/19 1315 03/03/19 1514 03/03/19 1515  BP: 124/64  (!) 101/53 (!) 101/53  Pulse: 87 82 75 76  Resp: (!) 22 15 11 18   Temp: 97.8 F (36.6 C)   (!) 97.5 F (36.4 C)  TempSrc: Axillary   Oral  SpO2: 100% 98% 98% 98%  Weight:      Height:        Intake/Output Summary (Last 24 hours) at 03/03/2019 1627 Last data filed at 03/03/2019 0552 Gross per 24 hour  Intake 1998 ml  Output 600 ml  Net 1398 ml   Filed Weights   03/01/19 2000  Weight: 100.7 kg    Examination: General exam: Appears comfortable - moving foot intermittently and slightly agitated but eyes not open and non-verbal HEENT: PERRLA, oral mucosa moist, no sclera icterus   Respiratory system: Clear to auscultation. Respiratory effort normal. Cardiovascular system: S1 & S2 heard,  No murmurs  Gastrointestinal system: Abdomen soft, non-tender, nondistended. Normal bowel   Extremities: No cyanosis, clubbing or edema    Data Reviewed: I have personally reviewed following labs and imaging studies  CBC: Recent Labs  Lab 03/01/19 1330 03/02/19 0209 03/02/19 0525  WBC 11.6* 10.3 9.6  NEUTROABS 8.0*  --   --   HGB 15.1* 14.2 14.4  HCT 55.0* 53.9* 53.7*  MCV 86.2 88.8 88.5  PLT 149* 142* 161*   Basic Metabolic Panel: Recent Labs  Lab 03/01/19 1330 03/02/19 0525  NA 140 137  K 5.0 3.5  CL 102 97*  CO2 23 26  GLUCOSE 89 80  BUN 13 10  CREATININE 1.31* 1.04*  CALCIUM 8.6* 7.9*   GFR: Estimated Creatinine Clearance:  62.7 mL/min (A) (by C-G formula based on SCr of 1.04 mg/dL (H)). Liver Function Tests: Recent Labs  Lab 03/01/19 1330  AST 73*  ALT 25  ALKPHOS 66  BILITOT 2.0*  PROT 7.6  ALBUMIN 3.2*   No results for input(s): LIPASE, AMYLASE in the last 168 hours. No results for input(s): AMMONIA in the last 168 hours. Coagulation Profile: No results for input(s): INR, PROTIME in the last 168 hours. Cardiac Enzymes: Recent Labs  Lab 03/01/19 1330  CKTOTAL 382*   BNP (last 3 results) No results for input(s): PROBNP in the last 8760 hours. HbA1C: Recent Labs    03/02/19 0525 03/03/19 0500  HGBA1C 6.3* 6.4*   CBG: Recent Labs  Lab 03/02/19 0644 03/03/19 0358 03/03/19  [place]   Lipid Profile: Recent Labs    03/02/19 0525 03/03/19 0500  CHOL 170 77  HDL 36* 15*  LDLCALC 103* 48  TRIG 154* 70  CHOLHDL 4.7 5.1   Thyroid Function Tests: No results for input(s): TSH, T4TOTAL, FREET4, T3FREE, THYROIDAB in the last 72 hours. Anemia Panel: No results for input(s): VITAMINB12, FOLATE, FERRITIN, TIBC, IRON, RETICCTPCT in the last 72 hours. Urine analysis:    Component Value Date/Time   COLORURINE AMBER (A) 03/01/2019 1721   APPEARANCEUR CLOUDY (A) 03/01/2019 1721   LABSPEC 1.023 03/01/2019 1721   PHURINE 5.0 03/01/2019 1721   GLUCOSEU NEGATIVE 03/01/2019 1721   HGBUR NEGATIVE 03/01/2019 1721   BILIRUBINUR NEGATIVE 03/01/2019 1721   KETONESUR 80 (A) 03/01/2019 1721   PROTEINUR 100 (A) 03/01/2019 1721   UROBILINOGEN 0.2 03/28/2015 1227   NITRITE NEGATIVE 03/01/2019 1721   LEUKOCYTESUR NEGATIVE 03/01/2019 1721   Sepsis Labs: @LABRCNTIP (procalcitonin:4,lacticidven:4) ) Recent Results (from the past 240 hour(s))  Urine culture     Status: Abnormal   Collection Time: 03/01/19  5:10 PM   Specimen: Urine, Random  Result Value Ref Range Status   Specimen Description URINE, RANDOM  Final   Special Requests NONE  Final   Culture (A)  Final    >=100,000  COLONIES/mL AEROCOCCUS SPECIES Standardized susceptibility testing for this organism is not available. Performed at [facility], [place] [address] [place], [place] [address]    Report Status 03/03/2019 FINAL  Final  SARS CORONAVIRUS 2 (TAT 6-24 HRS) Nasopharyngeal Nasopharyngeal Swab     Status: None   Collection Time: 03/01/19 10:10 PM   Specimen: Nasopharyngeal Swab  Result Value Ref Range Status   SARS Coronavirus 2 NEGATIVE NEGATIVE Final    Comment: (NOTE) SARS-CoV-2 target nucleic acids are NOT DETECTED. The SARS-CoV-2 RNA is generally detectable in upper and lower respiratory specimens during the acute phase of infection. Negative results do not preclude SARS-CoV-2 infection, do not rule out co-infections with other pathogens, and should not be used as the sole basis for treatment or other patient management decisions. Negative results must be combined with clinical observations, patient history, and epidemiological information. The expected result is Negative. Fact Sheet for Patients: SugarRoll.be Fact Sheet for Healthcare Providers: https://www.woods-mathews.com/ This test is not yet approved or cleared by the [place] FDA and  has been authorized for detection and/or diagnosis of SARS-CoV-2 by FDA under an Emergency Use Authorization (EUA). This EUA will remain  in effect (meaning this test can be used) for the duration of the COVID-19 declaration under Section 56 4(b)(1) of the Act, 21 U.S.C. section 360bbb-3(b)(1), unless the authorization is terminated or revoked sooner. Performed at [facility], [place] [address], [place], [place] [address]   Culture, blood (Routine X 2) w Reflex to ID Panel     Status: None (Preliminary result)   Collection Time: 03/02/19  2:09 AM   Specimen: BLOOD RIGHT FOREARM  Result Value Ref Range Status   Specimen Description BLOOD RIGHT FOREARM  Final   Special Requests   Final     BOTTLES DRAWN AEROBIC AND ANAEROBIC Blood Culture results may not be optimal due to an inadequate volume of blood received in culture bottles   Culture   Final    NO GROWTH 1 DAY Performed at [facility], [place] [address], [place], [place] [address]    Report Status PENDING  Incomplete  Culture, blood (Routine X 2) w  Reflex to ID Panel     Status: None (Preliminary result)   Collection Time: 03/02/19  5:25 AM   Specimen: BLOOD  Result Value Ref Range Status   Specimen Description BLOOD RIGHT ARM  Final   Special Requests   Final    BOTTLES DRAWN AEROBIC AND ANAEROBIC Blood Culture adequate volume   Culture   Final    NO GROWTH 1 DAY Performed at [facility], [place] [address] [place], [place] [address]    Report Status PENDING  Incomplete         Radiology Studies: Ct Head Wo Contrast  Result Date: 03/03/2019 CLINICAL DATA:  Focal neuro deficit. Unresponsive. 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 EXAM: CT HEAD WITHOUT CONTRAST TECHNIQUE: Contiguous axial images were obtained from the base of the skull through the vertex without intravenous contrast. COMPARISON:  Head CT 03/02/2019 FINDINGS: Brain: A 10 mm hemorrhage in the right lentiform nucleus with mild surrounding edema is unchanged. Additional patchy areas of hypoattenuation involving cortex and white matter in the right temporal lobe, inferior right parietal lobe, and posteromedial right frontal lobe appears slightly more well-defined than on the prior study. No new intracranial hemorrhage, definite new infarct, midline shift, or extra-axial fluid collection is identified. The ventricles are normal in size. Background chronic small vessel ischemic changes are present in the cerebral white matter bilaterally. Vascular: Calcified atherosclerosis at the skull base. No hyperdense vessel. Skull: No acute fracture or focal osseous lesion. Sinuses/Orbits: Old left orbital floor fracture. Mild right periorbital soft tissue  swelling. Mild mucosal thickening in the paranasal sinuses. Clear mastoid air cells. Other: None. IMPRESSION: Evolving acute right cerebral infarcts primarily in the MCA territory. Unchanged 10 mm right basal ganglia hemorrhage. Electronically Signed   By: Logan Bores M.D.   On: 03/03/2019 05:21   Ct Head Wo Contrast  Result Date: 03/02/2019 CLINICAL DATA:  [place] out of bed. EXAM: CT HEAD WITHOUT CONTRAST TECHNIQUE: Contiguous axial images were obtained from the base of the skull through the vertex without intravenous contrast. COMPARISON:  MR brain dated Nov 22, 2013. FINDINGS: Brain: There is hypodensity and loss of the gray-white matter differentiation predominantly involving the right temporal lobe, consistent with acute infarct. 8 x 8 x 10 mm area of hemorrhage in the right basal ganglia with surrounding edema. No midline shift, hydrocephalus, or extra-axial collection. Vascular: Atherosclerotic vascular calcification of the carotid siphons. No hyperdense vessel. Skull: Normal. Negative for fracture or focal lesion. Sinuses/Orbits: No acute finding. Mild ethmoid air cell mucosal thickening. Other: None. IMPRESSION: 1. Acute right MCA territory ischemic infarct. 10 mm hemorrhage in the right basal ganglia. No midline shift or hydrocephalus. Critical Value/emergent results were called by telephone at the time of interpretation on 03/02/2019 at 7:55 am to Dr. Debbe Odea, who verbally acknowledged these results. Electronically Signed   By: Titus Dubin M.D.   On: 03/02/2019 07:59   Ct Chest W Contrast  Result Date: 03/01/2019 CLINICAL DATA:  Patient status post fall out of bed at 6 a.m. this morning. Bilateral lower extremity weakness. EXAM: CT CHEST, ABDOMEN, AND PELVIS WITH CONTRAST TECHNIQUE: Multidetector CT imaging of the chest, abdomen and pelvis was performed following the standard protocol during bolus administration of intravenous contrast. CONTRAST:  152mL OMNIPAQUE IOHEXOL 300 MG/ML  SOLN  COMPARISON:  CT chest 11/03/2013.  PET CT scan 11/22/2013. FINDINGS: CT CHEST FINDINGS Cardiovascular: Pulmonary embolus are seen in right pulmonary arteries such as on image 5 of series 6 and  64-67 of series 6. Emboli are also seen in the aortic arch such as on images 21 and 22 of series 3 and 69 to 76 of series 7. Heart size is normal. No pleural or pericardial effusion. Calcific aortic and coronary atherosclerosis. Mediastinum/Nodes: No enlarged mediastinal, hilar, or axillary lymph nodes. Thyroid gland, trachea, and esophagus demonstrate no significant findings. Lungs/Pleura: Lungs are emphysematous. There is scar in the left upper lobe at the site of patient's prior lung cancer. Mild dependent atelectasis is noted. Musculoskeletal: No acute or focal bony abnormality. CT ABDOMEN PELVIS FINDINGS Hepatobiliary: No focal liver abnormality is seen. No gallstones, gallbladder wall thickening, or biliary dilatation. The liver is low attenuating consistent with fatty infiltration. Pancreas: 0.4 cm hypoattenuating lesion at the junction of the tail and body on image 65 is likely due to some prior inflammatory process. No peripancreatic inflammatory change. No pancreatic duct dilatation. Spleen: Wedge-shaped hypoattenuation in the superior aspect of the spleen measuring 2.7 cm AP x 2.7 cm transverse by 2.0 cm craniocaudal is consistent with infarct. Spleen is otherwise unremarkable. Adrenals/Urinary Tract: The adrenal glands appear normal. Wedge-shaped area peripheral hypoattenuation in the mid to upper pole of the right kidney is compatible with an infarct. More subtle area of decreased cortical attenuation in the more superior aspect of the right kidney is also worrisome for infarct. Left kidney appears normal. Ureters and urinary bladder are unremarkable. Stomach/Bowel: Stomach is within normal limits. Appendix appears normal. No evidence of bowel wall thickening, distention, or inflammatory changes.  Vascular/Lymphatic: Aortic atherosclerosis. No enlarged abdominal or pelvic lymph nodes. Reproductive: Uterus and bilateral adnexa are unremarkable. Other: None. Musculoskeletal: No acute or focal abnormality. Degenerative disc disease L5-S1 noted. 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 Findings raise the possibility for ventricular or atrial septal defect. No evidence of trauma. Fatty infiltration of the liver. Atherosclerosis. Emphysema. These results were called by telephone at the time of interpretation on 03/01/2019 at 7:29 pm to [facility], who verbally acknowledged these results. Electronically Signed   By: Inge Rise M.D.   On: 03/01/2019 19:36   Ct Abdomen Pelvis W Contrast  Result Date: 03/01/2019 CLINICAL DATA:  Patient status post fall out of bed at 6 a.m. this morning. Bilateral lower extremity weakness. EXAM: CT CHEST, ABDOMEN, AND PELVIS WITH CONTRAST TECHNIQUE: Multidetector CT imaging of the chest, abdomen and pelvis was performed following the standard protocol during bolus administration of intravenous contrast. CONTRAST:  127mL OMNIPAQUE IOHEXOL 300 MG/ML  SOLN COMPARISON:  CT chest 11/03/2013.  PET CT scan 11/22/2013. FINDINGS: CT CHEST FINDINGS Cardiovascular: Pulmonary embolus are seen in right pulmonary arteries such as on image 51 of series 6 and 64-67 of series 6. Emboli are also seen in the aortic arch such as on images 21 and 22 of series 3 and 69 to 76 of series 7. Heart size is normal. No pleural or pericardial effusion. Calcific aortic and coronary atherosclerosis. Mediastinum/Nodes: No enlarged mediastinal, hilar, or axillary lymph nodes. Thyroid gland, trachea, and esophagus demonstrate no significant findings. Lungs/Pleura: Lungs are emphysematous. There is scar in the left upper lobe at the site of patient's prior lung cancer. Mild dependent atelectasis is noted. Musculoskeletal: No acute or focal  bony abnormality. CT ABDOMEN PELVIS FINDINGS Hepatobiliary: No focal liver abnormality is seen. No gallstones, gallbladder wall thickening, or biliary dilatation. The liver is low attenuating consistent with fatty infiltration. Pancreas: 0.4 cm hypoattenuating lesion at the junction of the  tail and body on image 65 is likely due to some prior inflammatory process. No peripancreatic inflammatory change. No pancreatic duct dilatation. Spleen: Wedge-shaped hypoattenuation in the superior aspect of the spleen measuring 2.7 cm AP x 2.7 cm transverse by 2.0 cm craniocaudal is consistent with infarct. Spleen is otherwise unremarkable. Adrenals/Urinary Tract: The adrenal glands appear normal. Wedge-shaped area peripheral hypoattenuation in the mid to upper pole of the right kidney is compatible with an infarct. More subtle area of decreased cortical attenuation in the more superior aspect of the right kidney is also worrisome for infarct. Left kidney appears normal. Ureters and urinary bladder are unremarkable. Stomach/Bowel: Stomach is within normal limits. Appendix appears normal. No evidence of bowel wall thickening, distention, or inflammatory changes. Vascular/Lymphatic: Aortic atherosclerosis. No enlarged abdominal or pelvic lymph nodes. Reproductive: Uterus and bilateral adnexa are unremarkable. Other: None. Musculoskeletal: No acute or focal abnormality. Degenerative disc disease L5-S1 noted. 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 Findings raise the possibility for ventricular or atrial septal defect. No evidence of trauma. Fatty infiltration of the liver. Atherosclerosis. Emphysema. These results were called by telephone at the time of interpretation on 03/01/2019 at 7:29 pm to [facility], who verbally acknowledged these results. Electronically Signed   By: Inge Rise M.D.   On: 03/01/2019 19:36   Dg Chest Port 1 View  Result  Date: 03/03/2019 CLINICAL DATA:  Respiratory failure EXAM: PORTABLE CHEST 1 VIEW COMPARISON:  03/01/2019 FINDINGS: Cardiac shadow is mildly enlarged but stable. Aortic calcifications are again seen. The lungs are well aerated bilaterally. Increased density in the lateral aspect of the right lung base is noted likely related to the patient's known pulmonary emboli. Irregularity of the fifth and sixth ribs on the left laterally is seen. These were not present on prior chest x-rays from 2016 and likely related to the patient's known history of prior lung carcinoma with mild healing. No acute fracture is seen. IMPRESSION: Slight increase in parenchymal opacity in the right lung base laterally likely related to the known underlying pulmonary emboli. Old deformities of the fifth and sixth ribs on the left likely related to the patient's known history of prior lung carcinoma. Electronically Signed   By: Inez Catalina M.D.   On: 03/03/2019 06:10   Vas US Carotid (at [facility] Only)  Result Date: 03/02/2019 Carotid Arterial Duplex Study Indications:       CVA. Risk Factors:      None. Limitations        Today's exam was limited due to the body habitus of the                    patient, patient movement, patient positioning and the                    patient's respiratory variation. Comparison Study:  No prior studies. Performing Technologist: Oliver Hum RVT  Examination Guidelines: A complete evaluation includes B-mode imaging, spectral Doppler, color Doppler, and power Doppler as needed of all accessible portions of each vessel. Bilateral testing is considered an integral part of a complete examination. Limited examinations for reoccurring indications may be performed as noted.  Right Carotid Findings: +----------+--------+--------+--------+-----------------------+--------+             PSV cm/s EDV cm/s Stenosis Plaque Description      Comments  +----------+--------+--------+--------+-----------------------+--------+   CCA Prox   68  13                smooth and heterogenous           +----------+--------+--------+--------+-----------------------+--------+  CCA Distal 65       11                smooth and heterogenous           +----------+--------+--------+--------+-----------------------+--------+  ICA Prox   58       14                smooth and heterogenous tortuous  +----------+--------+--------+--------+-----------------------+--------+  ICA Distal 57       22                                        tortuous  +----------+--------+--------+--------+-----------------------+--------+  ECA        60       11                                                  +----------+--------+--------+--------+-----------------------+--------+ +----------+--------+-------+--------+-------------------+             PSV cm/s EDV cms Describe Arm Pressure (mmHG)  +----------+--------+-------+--------+-------------------+  Subclavian 111                                            +----------+--------+-------+--------+-------------------+ +---------+--------+--+--------+--+---------+  Vertebral PSV cm/s 73 EDV cm/s 24 Antegrade  +---------+--------+--+--------+--+---------+  Left Carotid Findings: +----------+--------+--------+--------+-----------------------+--------+             PSV cm/s EDV cm/s Stenosis Plaque Description      Comments  +----------+--------+--------+--------+-----------------------+--------+  CCA Prox   101      19                smooth and heterogenous           +----------+--------+--------+--------+-----------------------+--------+  CCA Distal 79       16                smooth and heterogenous           +----------+--------+--------+--------+-----------------------+--------+  ICA Prox   80       25                smooth and heterogenous tortuous  +----------+--------+--------+--------+-----------------------+--------+  ICA Distal 60       29                                        tortuous   +----------+--------+--------+--------+-----------------------+--------+  ECA        66                                                           +----------+--------+--------+--------+-----------------------+--------+ +----------+--------+--------+--------+-------------------+             PSV cm/s EDV cm/s Describe Arm Pressure (mmHG)  +----------+--------+--------+--------+-------------------+  Subclavian 151                                             +----------+--------+--------+--------+-------------------+ +---------+--------+--+--------+--+---------+  Vertebral PSV cm/s 72 EDV cm/s 27 Antegrade  +---------+--------+--+--------+--+---------+  Summary: Right Carotid: Velocities in the right ICA are consistent with a 1-39% stenosis. Left Carotid: Velocities in the left ICA are consistent with a 1-39% stenosis. Vertebrals: Bilateral vertebral arteries demonstrate antegrade flow. *See table(s) above for measurements and observations.  Electronically signed by Antony Contras MD on 03/02/2019 at 1:16:38 PM.    Final    Vas [name] Lower Extremity Venous (dvt)  Result Date: 03/02/2019  Lower Venous Study Indications: Pulmonary embolism, and stroke.  Risk Factors: None identified. Limitations: Body habitus, poor ultrasound/tissue interface and patient movement, patient positioning. Comparison Study: No prior studies. Performing Technologist: Oliver Hum RVT  Examination Guidelines: A complete evaluation includes B-mode imaging, spectral Doppler, color Doppler, and power Doppler as needed of all accessible portions of each vessel. Bilateral testing is considered an integral part of a complete examination. Limited examinations for reoccurring indications may be performed as noted.  +---------+---------------+---------+-----------+----------+--------------+  RIGHT     Compressibility Phasicity Spontaneity Properties Thrombus Aging  +---------+---------------+---------+-----------+----------+--------------+  CFV       Full             Yes       Yes                                    +---------+---------------+---------+-----------+----------+--------------+  SFJ       Full                                                             +---------+---------------+---------+-----------+----------+--------------+  FV Prox   Full                                                             +---------+---------------+---------+-----------+----------+--------------+  FV Mid    Full                                                             +---------+---------------+---------+-----------+----------+--------------+  FV Distal Full                                                             +---------+---------------+---------+-----------+----------+--------------+  PFV       Full                                                             +---------+---------------+---------+-----------+----------+--------------+  POP       Full            Yes       Yes                                    +---------+---------------+---------+-----------+----------+--------------+  PTV       Full                                                             +---------+---------------+---------+-----------+----------+--------------+  PERO      Full                                                             +---------+---------------+---------+-----------+----------+--------------+   +---------+---------------+---------+-----------+----------+--------------+  LEFT      Compressibility Phasicity Spontaneity Properties Thrombus Aging  +---------+---------------+---------+-----------+----------+--------------+  CFV       Full            Yes       Yes                                    +---------+---------------+---------+-----------+----------+--------------+  SFJ       Full                                                             +---------+---------------+---------+-----------+----------+--------------+  FV Prox   Full                                                              +---------+---------------+---------+-----------+----------+--------------+  FV Mid    Full                                                             +---------+---------------+---------+-----------+----------+--------------+  FV Distal Full                                                             +---------+---------------+---------+-----------+----------+--------------+  PFV       Full                                                             +---------+---------------+---------+-----------+----------+--------------+  POP       Full            Yes       Yes                                    +---------+---------------+---------+-----------+----------+--------------+  PTV       Full                                                             +---------+---------------+---------+-----------+----------+--------------+  PERO      Full                                                             +---------+---------------+---------+-----------+----------+--------------+     Summary: Right: There is no evidence of deep vein thrombosis in the lower extremity. No cystic structure found in the popliteal fossa. Left: There is no evidence of deep vein thrombosis in the lower extremity. No cystic structure found in the popliteal fossa.  *See table(s) above for measurements and observations. Electronically signed by Servando Snare MD on 03/02/2019 at 3:40:30 PM.    Final    [name] Ekg Site Rite  Result Date: 03/03/2019 If Site Rite image not attached, placement could not be confirmed due to current cardiac rhythm.     Scheduled Meds:   stroke: mapping our early stages of recovery book   Does not apply Once   calcium-vitamin D  1 tablet Oral Q breakfast   efavirenz-emtricitabine-tenofovir  1 tablet Oral QHS   gabapentin  600 mg Oral TID   loratadine  10 mg Oral BID   mometasone-formoterol  2 puff Inhalation BID   montelukast  10 mg Oral QHS   naloxone       nicotine  21 mg Transdermal Daily    omega-3 acid ethyl esters  1 g Oral Daily   sodium chloride flush  10-40 mL Intracatheter Q12H   tiZANidine  4 mg Oral BID   umeclidinium bromide  2 puff Inhalation Daily   venlafaxine XR  150 mg Oral Q breakfast   vitamin A  10,000 Units Oral Daily   vitamin C  125 mg Oral Daily   Continuous Infusions:  heparin 1,600 Units/hr (03/03/19 0936)     LOS: 2 days      Debbe Odea, MD [facility] Pager: [email or web address] Password [facility] 03/03/2019, 4:27 PM

## 2019-03-03 NOTE — Significant Event (Signed)
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 Noted PE on CTA. Bedside nurse called rapid response at 4 am for change in mentation. Patient obtunded. Sent to Head CT. No acute change. Assessed by Dr. Conley Simmonds who recommended Narcan given patient had received Morphine overnight. After patient was able to arouse but would not follow commands.   PCCM called back to patient bedside for AMS and hypotension. Given 1.5L Bolus with improvement of BP to 919 systolic. Mentation improved patient was able to follow commands, however this was intermittent and would come and go. ABG, LA, BMP, CBC sent.   ABG 0705 7.213/73.9/97.6. LA 0.5. Plans to place on BiPAP. Day time notified of patient and full consult note to follow.

## 2019-03-03 NOTE — Progress Notes (Signed)
STROKE TEAM PROGRESS NOTE   Patient with right MCA ischemic stroke and small right basal ganglia hemorrhage on anticoagulation with heparin drip for PE.  INTERVAL HISTORY Overnight, rapid response was called for sudden mental status change, NIHSS 26, pt was obtunded, BP in 50s with agonal breathing. CTH neg for hemorrhagic conversion. She received narcotics for leg pain earlier in the night. PCO2 was 80 on ABG. She became more responsive, was not intubated, bipap initiated instead. Today, PICC line was placed. On assessment she is stuporous but can arouse and is purposeful and violently pushes with all 4 extremities but will not open her eyes.  Vitals:   03/03/19 0932 03/03/19 1121 03/03/19 1154 03/03/19 1315  BP: (!) 112/59  124/64   Pulse: 80 88 87 82  Resp: 17 19 (!) 22 15  Temp:   97.8 F (36.6 C)   TempSrc:   Axillary   SpO2: 95% 100% 100% 98%  Weight:      Height:        CBC:  Recent Labs  Lab 03/01/19 1330 03/02/19 0209 03/02/19 0525  WBC 11.6* 10.3 9.6  NEUTROABS 8.0*  --   --   HGB 15.1* 14.2 14.4  HCT 55.0* 53.9* 53.7*  MCV 86.2 88.8 88.5  PLT 149* 142* 137*    Basic Metabolic Panel:  Recent Labs  Lab 03/01/19 1330 03/02/19 0525  NA 140 137  K 5.0 3.5  CL 102 97*  CO2 23 26  GLUCOSE 89 80  BUN 13 10  CREATININE 1.31* 1.04*  CALCIUM 8.6* 7.9*   Lipid Panel:     Component Value Date/Time   CHOL 77 03/03/2019 0500   TRIG 70 03/03/2019 0500   HDL 15 (L) 03/03/2019 0500   CHOLHDL 5.1 03/03/2019 0500   VLDL 14 03/03/2019 0500   LDLCALC 48 03/03/2019 0500   HgbA1c:  Lab Results  Component Value Date   HGBA1C 6.4 (H) 03/03/2019   Urine Drug Screen: No results found for: LABOPIA, COCAINSCRNUR, LABBENZ, AMPHETMU, THCU, LABBARB  Alcohol Level No results found for: [facility]  IMAGING Dg Chest 2 View  Result Date: 03/01/2019 CLINICAL DATA:  Anterior chest pain post fall. EXAM: CHEST - 2 VIEW COMPARISON:  February 14, 2019 FINDINGS: Cardiomediastinal silhouette  is normal. Mediastinal contours appear intact. Tortuosity of the aorta. There is no evidence of pleural effusion or pneumothorax. Linear opacities in the left mid thorax, possibly posttreatment or postsurgical changes. No radiographic evidence of displaced rib fractures. Soft tissues are grossly normal. IMPRESSION: 1. Linear opacities in the left mid thorax, possibly posttreatment or postsurgical changes. 2. No radiographic evidence of displaced rib fractures. Electronically Signed   By: Fidela Salisbury M.D.   On: 03/01/2019 14:48   Ct Head Wo Contrast  Result Date: 03/03/2019 CLINICAL DATA:  Focal neuro deficit. Unresponsive. 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 EXAM: CT HEAD WITHOUT CONTRAST TECHNIQUE: Contiguous axial images were obtained from the base of the skull through the vertex without intravenous contrast. COMPARISON:  Head CT 03/02/2019 FINDINGS: Brain: A 10 mm hemorrhage in the right lentiform nucleus with mild surrounding edema is unchanged. Additional patchy areas of hypoattenuation involving cortex and white matter in the right temporal lobe, inferior right parietal lobe, and posteromedial right frontal lobe appears slightly more well-defined than on the prior study. No new intracranial hemorrhage, definite new infarct, midline shift, or extra-axial fluid collection is identified. The ventricles are normal in size. Background chronic small vessel ischemic changes are present in the  cerebral white matter bilaterally. Vascular: Calcified atherosclerosis at the skull base. No hyperdense vessel. Skull: No acute fracture or focal osseous lesion. Sinuses/Orbits: Old left orbital floor fracture. Mild right periorbital soft tissue swelling. Mild mucosal thickening in the paranasal sinuses. Clear mastoid air cells. Other: None. IMPRESSION: Evolving acute right cerebral infarcts primarily in the MCA territory. Unchanged 10 mm right basal ganglia hemorrhage. Electronically Signed   By: Logan Bores M.D.   On: 03/03/2019 05:21   Ct Head Wo Contrast  Result Date: 03/02/2019 CLINICAL DATA:  [place] out of bed. EXAM: CT HEAD WITHOUT CONTRAST TECHNIQUE: Contiguous axial images were obtained from the base of the skull through the vertex without intravenous contrast. COMPARISON:  MR brain dated Nov 22, 2013. FINDINGS: Brain: There is hypodensity and loss of the gray-white matter differentiation predominantly involving the right temporal lobe, consistent with acute infarct. 8 x 8 x 10 mm area of hemorrhage in the right basal ganglia with surrounding edema. No midline shift, hydrocephalus, or extra-axial collection. Vascular: Atherosclerotic vascular calcification of the carotid siphons. No hyperdense vessel. Skull: Normal. Negative for fracture or focal lesion. Sinuses/Orbits: No acute finding. Mild ethmoid air cell mucosal thickening. Other: None. IMPRESSION: 1. Acute right MCA territory ischemic infarct. 10 mm hemorrhage in the right basal ganglia. No midline shift or hydrocephalus. Critical Value/emergent results were called by telephone at the time of interpretation on 03/02/2019 at 7:55 am to Dr. Debbe Odea, who verbally acknowledged these results. Electronically Signed   By: Titus Dubin M.D.   On: 03/02/2019 07:59   Ct Chest W Contrast  Result Date: 03/01/2019 CLINICAL DATA:  Patient status post fall out of bed at 6 a.m. this morning. Bilateral lower extremity weakness. EXAM: CT CHEST, ABDOMEN, AND PELVIS WITH CONTRAST TECHNIQUE: Multidetector CT imaging of the chest, abdomen and pelvis was performed following the standard protocol during bolus administration of intravenous contrast. CONTRAST:  13mL OMNIPAQUE IOHEXOL 300 MG/ML  SOLN COMPARISON:  CT chest 11/03/2013.  PET CT scan 11/22/2013. FINDINGS: CT CHEST FINDINGS Cardiovascular: Pulmonary embolus are seen in right pulmonary arteries such as on image 72 of series 6 and 64-67 of series 6. Emboli are also seen in the aortic arch such as on images  21 and 22 of series 3 and 69 to 76 of series 7. Heart size is normal. No pleural or pericardial effusion. Calcific aortic and coronary atherosclerosis. Mediastinum/Nodes: No enlarged mediastinal, hilar, or axillary lymph nodes. Thyroid gland, trachea, and esophagus demonstrate no significant findings. Lungs/Pleura: Lungs are emphysematous. There is scar in the left upper lobe at the site of patient's prior lung cancer. Mild dependent atelectasis is noted. Musculoskeletal: No acute or focal bony abnormality. CT ABDOMEN PELVIS FINDINGS Hepatobiliary: No focal liver abnormality is seen. No gallstones, gallbladder wall thickening, or biliary dilatation. The liver is low attenuating consistent with fatty infiltration. Pancreas: 0.4 cm hypoattenuating lesion at the junction of the tail and body on image 65 is likely due to some prior inflammatory process. No peripancreatic inflammatory change. No pancreatic duct dilatation. Spleen: Wedge-shaped hypoattenuation in the superior aspect of the spleen measuring 2.7 cm AP x 2.7 cm transverse by 2.0 cm craniocaudal is consistent with infarct. Spleen is otherwise unremarkable. Adrenals/Urinary Tract: The adrenal glands appear normal. Wedge-shaped area peripheral hypoattenuation in the mid to upper pole of the right kidney is compatible with an infarct. More subtle area of decreased cortical attenuation in the more superior aspect of the right kidney is also worrisome for infarct. Left kidney appears  normal. Ureters and urinary bladder are unremarkable. Stomach/Bowel: Stomach is within normal limits. Appendix appears normal. No evidence of bowel wall thickening, distention, or inflammatory changes. Vascular/Lymphatic: Aortic atherosclerosis. No enlarged abdominal or pelvic lymph nodes. Reproductive: Uterus and bilateral adnexa are unremarkable. Other: None. Musculoskeletal: No acute or focal abnormality. Degenerative disc disease L5-S1 noted. IMPRESSION: Pulmonary emboli and  emboli in the aortic arch with right renal infarcts and a splenic infarct. Findings raise the possibility for ventricular or atrial septal defect. No evidence of trauma. Fatty infiltration of the liver. Atherosclerosis. Emphysema. These results were called by telephone at the time of interpretation on 03/01/2019 at 7:29 pm to [facility], who verbally acknowledged these results. Electronically Signed   By: Inge Rise M.D.   On: 03/01/2019 19:36   Ct Abdomen Pelvis W Contrast  Result Date: 03/01/2019 CLINICAL DATA:  Patient status post fall out of bed at 6 a.m. this morning. Bilateral lower extremity weakness. EXAM: CT CHEST, ABDOMEN, AND PELVIS WITH CONTRAST TECHNIQUE: Multidetector CT imaging of the chest, abdomen and pelvis was performed following the standard protocol during bolus administration of intravenous contrast. CONTRAST:  157mL OMNIPAQUE IOHEXOL 300 MG/ML  SOLN COMPARISON:  CT chest 11/03/2013.  PET CT scan 11/22/2013. FINDINGS: CT CHEST FINDINGS Cardiovascular: Pulmonary embolus are seen in right pulmonary arteries such as on image 57 of series 6 and 64-67 of series 6. Emboli are also seen in the aortic arch such as on images 21 and 22 of series 3 and 69 to 76 of series 7. Heart size is normal. No pleural or pericardial effusion. Calcific aortic and coronary atherosclerosis. Mediastinum/Nodes: No enlarged mediastinal, hilar, or axillary lymph nodes. Thyroid gland, trachea, and esophagus demonstrate no significant findings. Lungs/Pleura: Lungs are emphysematous. There is scar in the left upper lobe at the site of patient's prior lung cancer. Mild dependent atelectasis is noted. Musculoskeletal: No acute or focal bony abnormality. CT ABDOMEN PELVIS FINDINGS Hepatobiliary: No focal liver abnormality is seen. No gallstones, gallbladder wall thickening, or biliary dilatation. The liver is low attenuating consistent with fatty infiltration. Pancreas: 0.4 cm hypoattenuating lesion at the  junction of the tail and body on image 65 is likely due to some prior inflammatory process. No peripancreatic inflammatory change. No pancreatic duct dilatation. Spleen: Wedge-shaped hypoattenuation in the superior aspect of the spleen measuring 2.7 cm AP x 2.7 cm transverse by 2.0 cm craniocaudal is consistent with infarct. Spleen is otherwise unremarkable. Adrenals/Urinary Tract: The adrenal glands appear normal. Wedge-shaped area peripheral hypoattenuation in the mid to upper pole of the right kidney is compatible with an infarct. More subtle area of decreased cortical attenuation in the more superior aspect of the right kidney is also worrisome for infarct. Left kidney appears normal. Ureters and urinary bladder are unremarkable. Stomach/Bowel: Stomach is within normal limits. Appendix appears normal. No evidence of bowel wall thickening, distention, or inflammatory changes. Vascular/Lymphatic: Aortic atherosclerosis. No enlarged abdominal or pelvic lymph nodes. Reproductive: Uterus and bilateral adnexa are unremarkable. Other: None. Musculoskeletal: No acute or focal abnormality. Degenerative disc disease L5-S1 noted. 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 Findings raise the possibility for ventricular or atrial septal defect. No evidence of trauma. Fatty infiltration of the liver. Atherosclerosis. Emphysema. These results were called by telephone at the time of interpretation on 03/01/2019 at 7:29 pm to [facility], who verbally acknowledged these results. Electronically Signed   By: Inge Rise M.D.   On:  03/01/2019 19:36   Dg Chest Port 1 View  Result Date: 03/03/2019 CLINICAL DATA:  Respiratory failure EXAM: PORTABLE CHEST 1 VIEW COMPARISON:  03/01/2019 FINDINGS: Cardiac shadow is mildly enlarged but stable. Aortic calcifications are again seen. The lungs are well aerated bilaterally. Increased density in the lateral aspect  of the right lung base is noted likely related to the patient's known pulmonary emboli. Irregularity of the fifth and sixth ribs on the left laterally is seen. These were not present on prior chest x-rays from 2016 and likely related to the patient's known history of prior lung carcinoma with mild healing. No acute fracture is seen. IMPRESSION: Slight increase in parenchymal opacity in the right lung base laterally likely related to the known underlying pulmonary emboli. Old deformities of the fifth and sixth ribs on the left likely related to the patient's known history of prior lung carcinoma. Electronically Signed   By: Inez Catalina M.D.   On: 03/03/2019 06:10   Dg Hips Bilat W Or Wo Pelvis 3-4 Views  Result Date: 03/01/2019 CLINICAL DATA:  Bilateral hip pain after fall today. EXAM: DG HIP (WITH OR WITHOUT PELVIS) 3-4V BILAT COMPARISON:  None. FINDINGS: There is no evidence of hip fracture or dislocation. There is no evidence of arthropathy or other focal bone abnormality. IMPRESSION: Negative. Electronically Signed   By: Marijo Conception M.D.   On: 03/01/2019 14:45   Vas US Carotid (at [facility] Only)  Result Date: 03/02/2019 Carotid Arterial Duplex Study Indications:       CVA. Risk Factors:      None. Limitations        Today's exam was limited due to the body habitus of the                    patient, patient movement, patient positioning and the                    patient's respiratory variation. Comparison Study:  No prior studies. Performing Technologist: Oliver Hum RVT  Examination Guidelines: A complete evaluation includes B-mode imaging, spectral Doppler, color Doppler, and power Doppler as needed of all accessible portions of each vessel. Bilateral testing is considered an integral part of a complete examination. Limited examinations for reoccurring indications may be performed as noted.  Right Carotid Findings: +----------+--------+--------+--------+-----------------------+--------+            PSV cm/sEDV cm/sStenosisPlaque Description     Comments +----------+--------+--------+--------+-----------------------+--------+ CCA Prox  68      13              smooth and heterogenous         +----------+--------+--------+--------+-----------------------+--------+ CCA Distal65      11              smooth and heterogenous         +----------+--------+--------+--------+-----------------------+--------+ ICA Prox  58      14              smooth and heterogenoustortuous +----------+--------+--------+--------+-----------------------+--------+ ICA Distal57      22                                     tortuous +----------+--------+--------+--------+-----------------------+--------+ ECA       60      11                                              +----------+--------+--------+--------+-----------------------+--------+ +----------+--------+-------+--------+-------------------+  PSV cm/sEDV cmsDescribeArm Pressure (mmHG) +----------+--------+-------+--------+-------------------+ Subclavian111                                        +----------+--------+-------+--------+-------------------+ +---------+--------+--+--------+--+---------+ VertebralPSV cm/s73EDV cm/s24Antegrade +---------+--------+--+--------+--+---------+  Left Carotid Findings: +----------+--------+--------+--------+-----------------------+--------+           PSV cm/sEDV cm/sStenosisPlaque Description     Comments +----------+--------+--------+--------+-----------------------+--------+ CCA Prox  101     19              smooth and heterogenous         +----------+--------+--------+--------+-----------------------+--------+ CCA Distal79      16              smooth and heterogenous         +----------+--------+--------+--------+-----------------------+--------+ ICA Prox  80      25              smooth and heterogenoustortuous  +----------+--------+--------+--------+-----------------------+--------+ ICA Distal60      29                                     tortuous +----------+--------+--------+--------+-----------------------+--------+ ECA       66                                                      +----------+--------+--------+--------+-----------------------+--------+ +----------+--------+--------+--------+-------------------+           PSV cm/sEDV cm/sDescribeArm Pressure (mmHG) +----------+--------+--------+--------+-------------------+ [id number]                                         +----------+--------+--------+--------+-------------------+ +---------+--------+--+--------+--+---------+ VertebralPSV cm/s72EDV cm/s27Antegrade +---------+--------+--+--------+--+---------+  Summary: Right Carotid: Velocities in the right ICA are consistent with a 1-39% stenosis. Left Carotid: Velocities in the left ICA are consistent with a 1-39% stenosis. Vertebrals: Bilateral vertebral arteries demonstrate antegrade flow. *See table(s) above for measurements and observations.  Electronically signed by Antony Contras MD on 03/02/2019 at 1:16:38 PM.    Final    Vas [name] Lower Extremity Venous (dvt)  Result Date: 03/02/2019  Lower Venous Study Indications: Pulmonary embolism, and stroke.  Risk Factors: None identified. Limitations: Body habitus, poor ultrasound/tissue interface and patient movement, patient positioning. Comparison Study: No prior studies. Performing Technologist: Oliver Hum RVT  Examination Guidelines: A complete evaluation includes B-mode imaging, spectral Doppler, color Doppler, and power Doppler as needed of all accessible portions of each vessel. Bilateral testing is considered an integral part of a complete examination. Limited examinations for reoccurring indications may be performed as noted.  +---------+---------------+---------+-----------+----------+--------------+ RIGHT     CompressibilityPhasicitySpontaneityPropertiesThrombus Aging +---------+---------------+---------+-----------+----------+--------------+ CFV      Full           Yes      Yes                                 +---------+---------------+---------+-----------+----------+--------------+ SFJ      Full                                                        +---------+---------------+---------+-----------+----------+--------------+  FV Prox  Full                                                        +---------+---------------+---------+-----------+----------+--------------+ FV Mid   Full                                                        +---------+---------------+---------+-----------+----------+--------------+ FV DistalFull                                                        +---------+---------------+---------+-----------+----------+--------------+ PFV      Full                                                        +---------+---------------+---------+-----------+----------+--------------+ POP      Full           Yes      Yes                                 +---------+---------------+---------+-----------+----------+--------------+ PTV      Full                                                        +---------+---------------+---------+-----------+----------+--------------+ PERO     Full                                                        +---------+---------------+---------+-----------+----------+--------------+   +---------+---------------+---------+-----------+----------+--------------+ LEFT     CompressibilityPhasicitySpontaneityPropertiesThrombus Aging +---------+---------------+---------+-----------+----------+--------------+ CFV      Full           Yes      Yes                                 +---------+---------------+---------+-----------+----------+--------------+ SFJ      Full                                                         +---------+---------------+---------+-----------+----------+--------------+ FV Prox  Full                                                        +---------+---------------+---------+-----------+----------+--------------+  FV Mid   Full                                                        +---------+---------------+---------+-----------+----------+--------------+ FV DistalFull                                                        +---------+---------------+---------+-----------+----------+--------------+ PFV      Full                                                        +---------+---------------+---------+-----------+----------+--------------+ POP      Full           Yes      Yes                                 +---------+---------------+---------+-----------+----------+--------------+ PTV      Full                                                        +---------+---------------+---------+-----------+----------+--------------+ PERO     Full                                                        +---------+---------------+---------+-----------+----------+--------------+     Summary: Right: There is no evidence of deep vein thrombosis in the lower extremity. No cystic structure found in the popliteal fossa. Left: There is no evidence of deep vein thrombosis in the lower extremity. No cystic structure found in the popliteal fossa.  *See table(s) above for measurements and observations. Electronically signed by Servando Snare MD on 03/02/2019 at 3:40:30 PM.    Final    [name] Ekg Site Rite  Result Date: 03/03/2019 If Site Rite image not attached, placement could not be confirmed due to current cardiac rhythm.   PHYSICAL EXAM GEN: Obese elderly African-American lady in mild respiratory distress on BiPAP for ventilatory support  CVS: RRR, no carotid bruit CHEST: No signs of resp distress, on room air ABD: Soft, NTTP  NEURO:  MENTAL STATUS:  Patient is stuporous and can be temporarily aroused but will not sustain attention. LANG/SPEECH: Speaks only occasional words and few short sentences and will not follow all commands CRANIAL NERVES:   II: Pupils equal and reactive, no RAPD, fundi and visual fields cannot be tested  III, IV, VI: Right gaze preference and will not follow gaze to command V: normal  VII: no facial asymmetry  VIII: normal hearing to speech  MOTOR: Antigravity strength in all 4 extremities but suspect trace left sided weakness but not cooperative for detailed exam REFLEXES: 2/4 throughout, bilateral  flexor planters  SENSORY: Normal to touch, temperature & pin prick in all extremiteis  COORD: Normal finger to nose and heel to shin, no tremor, no dysmetria  ASSESSMENT/PLAN Ms. [name] is a 73 y.o. female with history of non-small cell lung cancer s/p radiation therapy, HIV, Hep B, COPD, HLD, CKD stg 3, and legally blind who presented with fall, generalized weakness, and chest pain, found to have embolic infarcts in R MCA territory with subacute hemorrhagic component along with PE and prominent thrombus in aortic arch.  Acute R MCA territory infarct with hemorrhagic conversion in the basal ganglia, likely from embolic source especially in setting of aortic thrombus and PE   CT head Acute right MCA territory ischemic infarct. 10 mm hemorrhage in the right basal ganglia. No midline shift or hydrocephalus.  MRI  pending  2D Echo EF > 65%, moderate pulmonary HTN  LDL 48  HgbA1c 6.4  IV heparin stroke dose for PE + VTE ppx, monitor CBC and heparin levels  Therapy recommendations:  PT,OT,ST  Disposition: pending  Other Stroke Risk Factors  Advanced age  Cigarette smoker  Obesity, Body mass index is 32.78 kg/m., recommend weight loss, diet and exercise as appropriate  I have personally obtained history,examined this patient, reviewed notes, independently viewed imaging studies, participated in  medical decision making and plan of care.ROS completed by me personally and pertinent positives fully documented  I have made any additions or clarifications directly to the above note.   Patient has multiple underlying significant medical comorbid features and presented with sleepiness and falling off the bed and CT scan shows large right MCA infarct with small hemorrhagic conversion she also has acute pulmonary embolism is on IV heparin.  She developed some respiratory distress last night and is currently on BiPAP.  Palliative care team has been consulted to discuss goals of care with the family.  Agree with the risk-benefit of IV heparin is in favor of continuing it in the setting of an acute pulmonary embolism even though she has small area of hemorrhagic conversion.  Agree with discussion of goals of care with family given her significant underlying medical comorbidities and hypercoagulable disorder she is at significant risk for further worsening.  No family available at the bedside for discussion.  Discussed with Dr. [name].  Greater than 50% time during this 35-minute visit was spent on counseling and coordination of care about her stroke and hemorrhagic transformation and pulmonary embolism and discussion about risk benefit of anticoagulation  Antony Contras, MD Medical Director [facility] Pager: [phone] 03/03/2019 5:29 PM  To contact Stroke Continuity provider, please refer to [email or web address] After hours, contact General Neurology

## 2019-03-03 NOTE — Significant Event (Signed)
Rapid Response Event Note  Overview:Called d/t decreased LOC, BP-84/74. 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 Pt was placed on heparin gtt d/t PE. Time Called: 0351 Arrival Time: 0355 Event Type: Neurologic  Initial Focused Assessment: Pt laying in bed, responding only to painful stimulation. Pupils 3 and sluggish. Pt will move all extremities to pain but will not follow commands or speak.NIH-26. T-98.4, HR-98(SR), BP-55/30, RR-18, SpO2-95% on 4L [place], CBG-104. Pt taking shallow respirations, little air movement auscultated. Pt has no gag and has drool coming out of mouth. Schorr, NP, Aroor, and PCCM called and to bedside. Pt mental status waxing and waning on their assessments. Neuro and CCM feel it is safe to travel to CT scan. Pt taken to CT scan without incident. Once back from CT scan, pt given 0.4mg  narcan x 1 with a little improvement in mental status. Bipap considered d/t ABG however question whether pt at risk of aspiration . Plan: repeat ABG at 0600.   0525-[name], CCM NP to bedside. Pt SBP-60s. Second 1L NS bolus given. New PIV placed and VBG drawn(RT unable to get ABG). 0712-new ABG done, plan for bipap  Interventions: Heparin gtt turned off CBG-104 1L NS bolus x 1, Repeated at 0525 d/t SBP-60s Pt placed on bedside monitor. ABG-7.21/81.1/77.5/31.4 CBC BMP CT head-no acute changes PCXR-Slight increase in parenchymal opacity in the right lung base laterally likely related to the known underlying pulmonary emboli. Old deformities of the fifth and sixth ribs on the left likely related to the patient's known history of prior lung carcinoma. IVT to bedside-new PIV placed. VBG-results questionable-7.19/47.9/42.3/17.7 ABG with u/s by [name]-7.21/74/97/28.7 Bipap Plan of Care (if not transferred): Bipap, continue to monitor pt. Call RRT if further assistance needed. Event Summary: Name of Physician Notified: Schorr at [place]  Name of  Consulting Physician Notified: Aroor at 0415        [name]

## 2019-03-03 NOTE — Significant Event (Signed)
Rapid Response Event Note  Overview:Called d/t agitation and bipap alarms Time Called: 2214 Arrival Time: 2216 Event Type: Neurologic, Respiratory  Initial Focused Assessment: Pt thrashing around in bed, moving all extremities, not following commands, speaking occasionally saying "leave me alone." Bipap mask not on pt and ekg leads not connected. Pt will not allow bipap mask to be reapplied. Pt placed on 4L [place] at this time. HR-80, RR-21, SpO2-92% on 4L [place]. ABG at 2015 better than previous ones-7.28/61.5/160/28.5 and mental status, though pt won't follow commands and is agitated, is better than earlier.  Interventions: 4L  as long as SpO2>90 and mental status doesn't worsen Plan of Care (if not transferred): ABG at [place] and will reevaluate whether bipap is needed. Call RRT if further assistance needed. Event Summary: Name of Physician Notified: [place], NP at 2230  Name of Consulting Physician Notified: Aroor at 0415  Outcome: Stayed in room and stabalized  Event End Time: 2230  Dillard Essex

## 2019-03-03 NOTE — Progress Notes (Signed)
Comments: Notified by RR RN that pt has altered LOC. Labs ordered. ABG obtained. NIH has increased from 6 to 22 per RR RN. Neurology was notified and ordered a stat head CT w/o. RR RN concerned about travel as pt appears to not be managing airway. RR RN spoke w/ Dr Deterding who sent Dr Jenny Reichmann to see pt. After stimulation pt more awake and deemed her appropriate to transport to ct. Upon this NP's arrival pt prepared for transport to ct. Upon return from ct  RR RN relays that Dr Lorraine Lax w/ neurology reports no significant changes in ct. ABG reveals > 7.2/81/77.5/31.4. Pt remains very lethargic, responsive only to noxious stimuli. Rec'd ms 1 mg IV at 2230. Hesitant to start BiPAP given lethargy and not managing oral secretions well.  Assessment/Plan: 1. Altered LOC: Ct unchanged per neurology. Likely result of hypercarbia. Discussed pt once again with Dr Jimmy Footman w/ [facility] who updated Dr Jenny Reichmann. Dr Jenny Reichmann has requested pt be placed in sitting position w/ HOB at 30 degrees, give narcan 0.4 mg IV x 1 and repeat ABG. Pt repositioned as requested, Narcan given w/ some improvement in LOC. Remains mildly lethargic but will open eyes to voice at attempt to respond verbally. Will repeat ABG at 0600. Will change level of care to progressive and await ABG results.  Jeryl Columbia, NP-C [facility] Pager [phone]  CRITICAL CARE Performed by: Jeryl Columbia   Total critical care time: 90 minutes  Critical care time was exclusive of separately billable procedures and treating other patients.  Critical care was necessary to treat or prevent imminent or life-threatening deterioration.  Critical care was time spent personally by me on the following activities: development of treatment plan with patient and/or surrogate as well as nursing, discussions with consultants, evaluation of patient's response to treatment, examination of patient, obtaining history from patient or surrogate, ordering and performing  treatments and interventions, ordering and review of laboratory studies, ordering and review of radiographic studies, pulse oximetry and re-evaluation of patient's condition.

## 2019-03-03 NOTE — Progress Notes (Addendum)
ANTICOAGULATION CONSULT NOTE  Pharmacy Consult:  Heparin Indication: pulmonary embolus  No Known Allergies  Patient Measurements: Height: 5\' 9"  (175.3 cm) Weight: 222 lb 0.1 oz (100.7 kg) IBW/kg (Calculated) : 66.2 Heparin Dosing Weight: 88 kg  Vital Signs: Temp: 97.5 F (36.4 C) (09/04 1515) Temp Source: Oral (09/04 1515) BP: 101/53 (09/04 1515) Pulse Rate: 76 (09/04 1515)  Labs: Recent Labs    03/01/19 1330 03/02/19 0209 03/02/19 0525 03/03/19 1625  HGB 15.1* 14.2 14.4  --   HCT 55.0* 53.9* 53.7*  --   PLT 149* 142* 137*  --   HEPARINUNFRC  --   --  0.34 0.18*  CREATININE 1.31*  --  1.04* 0.86  CKTOTAL 382*  --   --   --   TROPONINIHS  --  117* 128*  --     Estimated Creatinine Clearance: 75.8 mL/min (by C-G formula based on SCr of 0.86 mg/dL).   Assessment: 73 yr old female presented S/P fall (slid to floor from bed), hip pain. 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 Pharmacy consulted to dose heparin for PE.  Heparin was turned off 03/02/19 PM due to concern for stroke with hemorrhagic conversion. Neurology believes hemorrhagic component is older and have ordered to restart IV heparin.  Early this morning, patient had AMS with hypotension, thought due to narcotic since patient responded to Narcan and IVF.  Heparin was turned off again while awaiting repeat head CT. CT is stable with unchanged hemorrhage.  Spoke to Dr. [name], okay to resume IV heparin.  Heparin level drawn this afternoon ~7 hrs after restarting heparin 1600 units/hr was 0.18 units/mL, which is below the desired goal range for this pt.  H/H, platelets stable. Per RN, no issues with IV or signs/sx of bleeding.   Goal of Therapy:  Heparin level 0.3 - 0.5 units/ml Monitor platelets by anticoagulation protocol: Yes   Plan:  Increase heparin infusion to 1850 units/hr Check 8 hr heparin level Daily heparin level and CBC Monitor for signs/sx of bleeding  Gillermina Hu, PharmD, BCPS, [facility] Clinical Pharmacist 03/03/2019, 6:42 PM

## 2019-03-03 NOTE — Progress Notes (Signed)
Peripherally Inserted Central Catheter/Midline Placement  The IV Nurse has discussed with the patient and/or persons authorized to consent for the patient, the purpose of this procedure and the potential benefits and risks involved with this procedure.  The benefits include less needle sticks, lab draws from the catheter, and the patient may be discharged home with the catheter. Risks include, but not limited to, infection, bleeding, blood clot (thrombus formation), and puncture of an artery; nerve damage and irregular heartbeat and possibility to perform a PICC exchange if needed/ordered by physician.  Alternatives to this procedure were also discussed.  Bard Power PICC patient education guide, fact sheet on infection prevention and patient information card has been provided to patient /or left at bedside.  Phone consent obtain from brother  PICC/Midline Placement Documentation  PICC Double Lumen 89/37/34 PICC Right Basilic 41 cm 0 cm (Active)  Indication for Insertion or Continuance of Line Poor Vasculature-patient has had multiple peripheral attempts or PIVs lasting less than 24 hours;Limited venous access - need for IV therapy >5 days (PICC only) 03/03/19 1317  Exposed Catheter (cm) 1 cm 03/03/19 1317  Site Assessment Clean;Dry;Intact 03/03/19 1317  Lumen #1 Status Flushed;Saline locked;Blood return noted 03/03/19 1317  Lumen #2 Status Flushed;Saline locked;Blood return noted 03/03/19 1317  Dressing Type Transparent 03/03/19 1317  Dressing Status Clean;Dry;Antimicrobial disc in place;Intact 03/03/19 1317  Dressing Change Due 03/10/19 03/03/19 1317       Gordan Payment 03/03/2019, 1:18 PM

## 2019-03-04 DIAGNOSIS — Z66 Do not resuscitate: Secondary | ICD-10-CM

## 2019-03-04 LAB — CBC
HCT: 50 % — ABNORMAL HIGH (ref 36.0–46.0)
Hemoglobin: 13.3 g/dL (ref 12.0–15.0)
MCH: 23.7 pg — ABNORMAL LOW (ref 26.0–34.0)
MCHC: 26.6 g/dL — ABNORMAL LOW (ref 30.0–36.0)
MCV: 89.1 fL (ref 80.0–100.0)
Platelets: 115 10*3/uL — ABNORMAL LOW (ref 150–400)
RBC: 5.61 MIL/uL — ABNORMAL HIGH (ref 3.87–5.11)
RDW: 23.2 % — ABNORMAL HIGH (ref 11.5–15.5)
WBC: 6.9 10*3/uL (ref 4.0–10.5)
nRBC: 0 % (ref 0.0–0.2)

## 2019-03-04 LAB — BLOOD GAS, ARTERIAL
Acid-Base Excess: 1.9 mmol/L (ref 0.0–2.0)
Bicarbonate: 27.6 mmol/L (ref 20.0–28.0)
Drawn by: 519031
O2 Content: 4 L/min
O2 Saturation: 90.3 %
Patient temperature: 97.8
pCO2 arterial: 56.3 mmHg — ABNORMAL HIGH (ref 32.0–48.0)
pH, Arterial: 7.308 — ABNORMAL LOW (ref 7.350–7.450)
pO2, Arterial: 62.7 mmHg — ABNORMAL LOW (ref 83.0–108.0)

## 2019-03-04 LAB — BASIC METABOLIC PANEL
Anion gap: 11 (ref 5–15)
BUN: 8 mg/dL (ref 8–23)
CO2: 25 mmol/L (ref 22–32)
Calcium: 8.2 mg/dL — ABNORMAL LOW (ref 8.9–10.3)
Chloride: 104 mmol/L (ref 98–111)
Creatinine, Ser: 0.85 mg/dL (ref 0.44–1.00)
GFR calc Af Amer: >60 mL/min (ref >60)
GFR calc non Af Amer: >60 mL/min (ref >60)
Glucose, Bld: 96 mg/dL (ref 70–99)
Potassium: 4.2 mmol/L (ref 3.5–5.1)
Sodium: 140 mmol/L (ref 135–145)

## 2019-03-04 LAB — HEPARIN LEVEL (UNFRACTIONATED)
Heparin Unfractionated: 0.46 IU/mL (ref 0.30–0.70)
Heparin Unfractionated: 0.19 IU/mL — ABNORMAL LOW (ref 0.30–0.70)
Heparin Unfractionated: 0.63 IU/mL (ref 0.30–0.70)

## 2019-03-04 MED ORDER — METHOCARBAMOL 500 MG PO TABS: 500.0000 mg | ORAL_TABLET | Freq: Four times a day (QID) | ORAL | Status: DC | PRN

## 2019-03-04 MED ORDER — FENTANYL CITRATE (PF) 100 MCG/2ML IJ SOLN: 12.5000 ug | INTRAMUSCULAR | Status: DC | PRN

## 2019-03-04 MED ORDER — OXYCODONE HCL 5 MG PO TABS
5.0000 mg | ORAL_TABLET | Freq: Two times a day (BID) | ORAL | Status: DC | PRN
  Administered 2019-03-04 – 2019-03-10 (×5): 5 mg via ORAL
  Filled 2019-03-04 (×7): qty 1

## 2019-03-04 MED ORDER — METHOCARBAMOL 500 MG PO TABS
500.0000 mg | ORAL_TABLET | Freq: Two times a day (BID) | ORAL | Status: DC | PRN
  Administered 2019-03-05 – 2019-03-10 (×4): 500 mg via ORAL
  Filled 2019-03-04 (×4): qty 1

## 2019-03-04 MED ORDER — ACETAMINOPHEN 325 MG PO TABS
650.0000 mg | ORAL_TABLET | Freq: Three times a day (TID) | ORAL | Status: DC
  Administered 2019-03-04 – 2019-03-09 (×14): 650 mg via ORAL
  Filled 2019-03-04 (×13): qty 2

## 2019-03-04 MED ORDER — DULOXETINE HCL 20 MG PO CPEP
20.0000 mg | ORAL_CAPSULE | Freq: Every day | ORAL | Status: DC
  Administered 2019-03-04 – 2019-03-10 (×7): 20 mg via ORAL
  Filled 2019-03-04 (×7): qty 1

## 2019-03-04 MED ORDER — HALOPERIDOL LACTATE 5 MG/ML IJ SOLN
1.0000 mg | INTRAMUSCULAR | Status: DC | PRN
  Administered 2019-03-04: 1 mg via INTRAVENOUS
  Administered 2019-03-05 (×2): 4 mg via INTRAVENOUS
  Filled 2019-03-04 (×3): qty 1

## 2019-03-04 MED ORDER — METHYLPREDNISOLONE SODIUM SUCC 40 MG IJ SOLR: 40.0000 mg | Freq: Every day | INTRAMUSCULAR | Status: DC

## 2019-03-04 NOTE — Evaluation (Signed)
Occupational Therapy Evaluation Patient Details Name: [name] MRN: [id number] DOB: [birth year] Today's Date: 03/04/2019    History of Present Illness 73yo F dmitted 9/2 after a fall with generalized weakness and CP.  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 As of 9/4, pt with AMS CT H is evolving.PMH HIV, adenocarcinoma of LUL s/p radiation therapy, HTN, HLD, COPD, chronic respiratory failure on 4 L/min oxygen at baseline, asthma, HBV, tobacco abuse, CKD III.   Clinical Impression   PT admitted with fall and weakness. Pt currently with functional limitiations due to the deficits: decreased attention, weakness, poor ability to care for self and decreased mobility. Pt difficulty with following commands. Pt performing ADL tasks with maxA to [place] at this time. Per chart, pt is legally blind. Pt modA +2 for transfers and mobility. Pt will benefit from skilled OT to increase their independence and safety with adls and balance to allow discharge to SNF. OT following acutely.     Follow Up Recommendations  SNF;Supervision/Assistance - 24 hour    Equipment Recommendations  Other (comment)(to be determined)    Recommendations for Other Services       Precautions / Restrictions Precautions Precautions: Fall;Other (comment) Precaution Comments: watch O2 Restrictions Weight Bearing Restrictions: No      Mobility Bed Mobility Overal bed mobility: Needs Assistance Bed Mobility: Supine to Sit;Sit to Sidelying     Supine to sit: Min assist   Sit to sidelying: Min assist;Mod assist General bed mobility comments: pt able to move in bed, minimal assist for direction and safety  Transfers Overall transfer level: Needs assistance Equipment used: None;Ambulation equipment used Transfers: Sit to/from [facility] Sit to Stand: Mod assist Stand pivot transfers: Mod assist;+2 safety/equipment        General transfer comment: assist for guidance and safety since pt unable to focus on task.    Balance Overall balance assessment: Needs assistance   Sitting balance-Leahy Scale: Fair Sitting balance - Comments:  pt able to sit without UE assist, but needs guard for safety   Standing balance support: Single extremity supported;Bilateral upper extremity supported Standing balance-Leahy Scale: Poor Standing balance comment: not focused enough for safety without external assist                           ADL either performed or assessed with clinical judgement   ADL Overall ADL's : Needs assistance/impaired Eating/Feeding: Total assistance   Grooming: Maximal assistance;Total assistance;Sitting Grooming Details (indicate cue type and reason): unable to attend to task at hand Upper Body Bathing: Maximal assistance;Total assistance;Sitting   Lower Body Bathing: Total assistance   Upper Body Dressing : Maximal assistance;Total assistance   Lower Body Dressing: Total assistance   Toilet Transfer: Maximal assistance;Total assistance   Toileting- Clothing Manipulation and Hygiene: Maximal assistance;Total assistance   Tub/ Shower Transfer: Maximal assistance;Total assistance   Functional mobility during ADLs: Moderate assistance;+2 for physical assistance;+2 for safety/equipment General ADL Comments: maxA to [place]. pt unable to focus on any task at hand- per chart, pt is legally blind     Vision Baseline Vision/History: Legally blind Vision Assessment?: Vision impaired- to be further tested in functional context     Perception     Praxis      Pertinent Vitals/Pain Pain Assessment: Faces Faces Pain Scale: Hurts little more Pain Location: vague, suspect chest Pain Descriptors / Indicators: Discomfort Pain  Intervention(s): Monitored during session     Hand Dominance Right   Extremity/Trunk Assessment Upper Extremity Assessment Upper Extremity  Assessment: Defer to OT evaluation   Lower Extremity Assessment Lower Extremity Assessment: Generalized weakness(able to bear weight/amb., but uncoordinated)   Cervical / Trunk Assessment Cervical / Trunk Assessment: Other exceptions Cervical / Trunk Exceptions: large body habitus   Communication Communication Communication: No difficulties   Cognition Arousal/Alertness: Awake/alert;Lethargic Behavior During Therapy: Restless Overall Cognitive Status: Impaired/Different from baseline Area of Impairment: Orientation;Following commands;Attention;Safety/judgement;Awareness                 Orientation Level: Situation;Time;Place Current Attention Level: Focused   Following Commands: Follows one step commands inconsistently Safety/Judgement: Decreased awareness of safety;Decreased awareness of deficits Awareness: Intellectual       General Comments       Exercises     Shoulder Instructions      Home Living Family/patient expects to be discharged to:: Private residence Living Arrangements: Alone Available Help at Discharge: Family;Available PRN/intermittently Type of Home: House Home Access: Stairs to enter [employer] of Steps: 1   Home Layout: One level         Biochemist, clinical: Standard     Home Equipment: Environmental consultant - 2 wheels;Tub bench;Hand held shower head   Additional Comments: Information taken from previous hospital stay as pt unable to relay information in current state of AMS and poor attention.  Lives With: Alone    Prior Functioning/Environment Level of Independence: Independent with assistive device(s)        Comments: used walker when she would go out; struggling wtih IADL's (cooking/cleaning);  reports she is legally blind        OT Problem List: Decreased strength;Decreased range of motion;Decreased activity tolerance;Impaired balance (sitting and/or standing);Decreased coordination;Decreased cognition;Decreased safety  awareness;Pain;Impaired UE functional use      OT Treatment/Interventions: Self-care/ADL training;Therapeutic exercise;Neuromuscular education;Energy conservation;DME and/or AE instruction;Therapeutic activities;Cognitive remediation/compensation;Visual/perceptual remediation/compensation;Patient/family education;Balance training    OT Goals(Current goals can be found in the care plan section) Acute Rehab OT Goals Patient Stated Goal: unable to state OT Goal Formulation: With patient Time For Goal Achievement: 03/18/19 Potential to Achieve Goals: Good ADL Goals Pt Will Perform Eating: sitting;with set-up Pt Will Perform Grooming: with set-up;sitting Pt Will Transfer to Toilet: bedside commode;with min guard assist Pt/caregiver will Perform Home Exercise Program: Increased strength;Both right and left upper extremity;With written HEP provided;Independently Additional ADL Goal #1: Pt will attend to task x10 mins with 1-2 verbal cues to sustain attention  OT Frequency: Min 2X/week   Barriers to D/C: Decreased caregiver support  lives alone       Co-evaluation              AM-PAC OT "6 Clicks" Daily Activity     Outcome Measure Help from another person eating meals?: Total Help from another person taking care of personal grooming?: Total Help from another person toileting, which includes using toliet, bedpan, or urinal?: Total Help from another person bathing (including washing, rinsing, drying)?: Total Help from another person to put on and taking off regular upper body clothing?: Total Help from another person to put on and taking off regular lower body clothing?: Total 6 Click Score: 6   End of Session Equipment Utilized During Treatment: Gait belt Nurse Communication: Mobility status  Activity Tolerance: Treatment limited secondary to medical complications (Comment) Patient left: in bed;with call bell/phone within reach;with bed alarm set  OT Visit Diagnosis:  Unsteadiness on feet (R26.81);Muscle weakness (generalized) (M62.81)  Time: 1111-1140 OT Time Calculation (min): 29 min Charges:  OT General Charges $OT Visit: 1 Visit OT Evaluation $OT Eval Moderate Complexity: 1 Mod  Darryl Nestle) Marsa Aris OTR/L Acute Rehabilitation Services Pager: [phone] Office: [phone]   Audie Pinto 03/04/2019, 1:21 PM

## 2019-03-04 NOTE — Progress Notes (Signed)
PROGRESS NOTE    [name]   AGT:[id number]  DOB: 1946/05/08  DOA: 03/01/2019 PCP: System, Provider Not In   Brief Narrative:  [name] is a 73 y.o. 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 Patient had noted generalized weakness and leg pain for a number of days and then fell out of the bed around 6 AM and laid on the floor until her son found her about 6 hours later.  She was too weak to ambulate.  She noted right-sided lower rib pain after the fall.  The patient underwent a CT of the chest abdomen pelvis in the ED.  Results showed pulmonary emboli and emboli in the aortic arch with a right renal infarcts and a splenic infarct.  Also noted is a fatty liver and emphysema. She was started on a heparin infusion.  She subsequently had a CT scan of her head which was ordered by the admitting doctor and showed acute right MCA territory ischemic infarct. 10 mm hemorrhage in the right basal ganglia  She was recently admitted from 8/15-8/21 for acute respiratory failure due to COPD and overmedications and her Robaxin, Gabapentin and Seroquel. She required a BiPAP. She was discharged home on 4 L O2 and a CPAP.  Subjective: She was sleepy this AM but ABG showed CO2 retention had improved. She awoke later and worked with PT. Able to walk about 15 ft with 2 + assist.      Assessment & Plan:   Principal Problem: Acute hypercapnic respiratory failure - may be due to narcotics, progression of CVA in setting of PEs or COPD flare - needed > 12 hrs of BiPAP but improved -   added low dose solumedrol  - will begin to wean steroids - still needing about 4-6 L O2  CVA (cerebral vascular accident)  - left facial droop, mild weakness on left side with subjective decreased sensation -  CVA may be embolic due to aortic thrombus? Neuro recommended Heparin infusion - Her 2D echo does not show a PFO - will need SNF based on PT/OT evals - she just passed her swallow eval- will order diet - Hypercoagulable work up ordered - Cardiolipin neg, Pro C normal, B2 glycoprotein neg, Homocysteine normal, Antithrombin III normal    PE (pulmonary thromboembolism) -acute respiratory failure - on 9/3, in ED,  patient was quite hypoxic and requiring 4 L of oxygen - Bilateral lower extremity venous duplex is negative for DVT - cont Heparin infusion    Aortic thrombus  - cont Heparin infusion   DM 2 - Hb A1c 6.4    Human immunodeficiency virus (HIV) disease  -Last CD4 count was 406 on 02/13/2019 -Continue Atripla  Neuropathy in legs - was non stop complaining of pain in legs after admitted - now holding gabapentin and all pain medications due to her episode of hypercapnia and also as she is NPO - NOTE: she was not in pain when I saw her today and interestingly PT note does not mention pain     Cigarette smoker - COPD (chronic obstructive pulmonary disease)   - advised to stop smoking     Non-small cell lung cancer   - s/p radiation    CKD (chronic kidney disease), stage II- III    - stable  + UA/ urine culture > 100K Aerococcus -  she has not complained of dysuria- follow off of antibitoics  Time spent in minutes: 35 DVT prophylaxis: SCDs Code Status: Full code- brother requests full code for now Family Communication: discussed plan in detail with brother Rejina Odle Disposition Plan: cont to follow in hospital- will ask for a palliative care consult Consultants:   Neuro Procedures:   2D echo 1. The left ventricle has hyperdynamic systolic function, with an ejection fraction of >65%. The cavity size was normal. There is mildly increased left ventricular wall thickness. Left ventricular diastolic Doppler parameters are consistent with impaired relaxation. 2. The  right ventricle has normal systolic function. The cavity was normal. 3. The mitral valve is abnormal. Mild thickening of the mitral valve leaflet. 4. The tricuspid valve is grossly normal. 5. The aortic valve is tricuspid. Mild calcification of the aortic valve. No stenosis of the aortic valve. 6. The aorta is normal unless otherwise noted. 7. Vigorous LV systolic function; mild LVH; grade 1 diastolic dysfunction; trace TR; moderate pulmonary hypertension Antimicrobials:  Anti-infectives (From admission, onward)   Start     Dose/Rate Route Frequency Ordered Stop   03/01/19 2215  efavirenz-emtricitabine-tenofovir (ATRIPLA) 600-200-300 MG per tablet 1 tablet  Status:  Discontinued     1 tablet Oral Daily at bedtime 03/01/19 2206 03/03/19 1641       Objective: Vitals:   03/03/19 2352 03/04/19 0325 03/04/19 0700 03/04/19 1100  BP: (!) 136/59 133/66 (!) 142/65 (!) 153/71  Pulse: 85 79    Resp: (!) 23 18    Temp:  97.8 F (36.6 C) 97.6 F (36.4 C) 98.2 F (36.8 C)  TempSrc:  Axillary Axillary   SpO2: 94% 98%    Weight:      Height:        Intake/Output Summary (Last 24 hours) at 03/04/2019 1343 Last data filed at 03/04/2019 0800 Gross per 24 hour  Intake 1154.85 ml  Output 425 ml  Net 729.85 ml   Filed Weights   03/01/19 2000  Weight: 100.7 kg    Examination: General exam: Appears comfortable  HEENT: PERRLA, oral mucosa moist, no sclera icterus or thrush Respiratory system: Clear to auscultation. Respiratory effort normal. Cardiovascular system: S1 & S2 heard,  No murmurs  Gastrointestinal system: Abdomen soft, non-tender, nondistended. Normal bowel sounds   Central nervous system: asleep this AM- difficult to awaken Extremities: No cyanosis, clubbing or edema Skin: No rashes or ulcers  Data Reviewed: I have personally reviewed following labs and imaging studies  CBC: Recent Labs  Lab 03/01/19 1330 03/02/19 0209 03/02/19 0525 03/04/19 0332  WBC 11.6* 10.3 9.6 6.9   NEUTROABS 8.0*  --   --   --   HGB 15.1* 14.2 14.4 13.3  HCT 55.0* 53.9* 53.7* 50.0*  MCV 86.2 88.8 88.5 89.1  PLT 149* 142* 137* 951*   Basic Metabolic Panel: Recent Labs  Lab 03/01/19 1330 03/02/19 0525 03/03/19 1625 03/04/19 0332  NA 140 137 142 140  K 5.0 3.5 3.9 4.2  CL 102 97* 105 104  CO2 23 26 29 25   GLUCOSE 89 80 89 96  BUN 13 10 8 8   CREATININE 1.31* 1.04* 0.86 0.85  CALCIUM 8.6* 7.9* 8.2* 8.2*   GFR: Estimated Creatinine Clearance: 76.7 mL/min (by C-G formula based on SCr of 0.85 mg/dL). Liver Function Tests: Recent Labs  Lab 03/01/19 1330  AST 73*  ALT 25  ALKPHOS 66  BILITOT 2.0*  PROT 7.6  ALBUMIN 3.2*   No results for input(s):  LIPASE, AMYLASE in the last 168 hours. No results for input(s): AMMONIA in the last 168 hours. Coagulation Profile: No results for input(s): INR, PROTIME in the last 168 hours. Cardiac Enzymes: Recent Labs  Lab 03/01/19 1330  CKTOTAL 382*   BNP (last 3 results) No results for input(s): PROBNP in the last 8760 hours. HbA1C: Recent Labs    03/02/19 0525 03/03/19 0500  HGBA1C 6.3* 6.4*   CBG: Recent Labs  Lab 03/02/19 0644 03/03/19 0358 03/03/19 0546  GLUCAP 102* 104* 98   Lipid Profile: Recent Labs    03/02/19 0525 03/03/19 0500  CHOL 170 77  HDL 36* 15*  LDLCALC 103* 48  TRIG 154* 70  CHOLHDL 4.7 5.1   Thyroid Function Tests: No results for input(s): TSH, T4TOTAL, FREET4, T3FREE, THYROIDAB in the last 72 hours. Anemia Panel: No results for input(s): VITAMINB12, FOLATE, FERRITIN, TIBC, IRON, RETICCTPCT in the last 72 hours. Urine analysis:    Component Value Date/Time   COLORURINE AMBER (A) 03/01/2019 1721   APPEARANCEUR CLOUDY (A) 03/01/2019 1721   LABSPEC 1.023 03/01/2019 1721   PHURINE 5.0 03/01/2019 1721   GLUCOSEU NEGATIVE 03/01/2019 1721   HGBUR NEGATIVE 03/01/2019 1721   BILIRUBINUR NEGATIVE 03/01/2019 1721   KETONESUR 80 (A) 03/01/2019 1721   PROTEINUR 100 (A) 03/01/2019 1721    UROBILINOGEN 0.2 03/28/2015 1227   NITRITE NEGATIVE 03/01/2019 1721   LEUKOCYTESUR NEGATIVE 03/01/2019 1721   Sepsis Labs: @LABRCNTIP (procalcitonin:4,lacticidven:4) ) Recent Results (from the past 240 hour(s))  Urine culture     Status: Abnormal   Collection Time: 03/01/19  5:10 PM   Specimen: Urine, Random  Result Value Ref Range Status   Specimen Description URINE, RANDOM  Final   Special Requests NONE  Final   Culture (A)  Final    >=100,000 COLONIES/mL AEROCOCCUS SPECIES Standardized susceptibility testing for this organism is not available. Performed at [facility], [place] [address], [place], [place] [address]    Report Status 03/03/2019 FINAL  Final  SARS CORONAVIRUS 2 (TAT 6-24 HRS) Nasopharyngeal Nasopharyngeal Swab     Status: None   Collection Time: 03/01/19 10:10 PM   Specimen: Nasopharyngeal Swab  Result Value Ref Range Status   SARS Coronavirus 2 NEGATIVE NEGATIVE Final    Comment: (NOTE) SARS-CoV-2 target nucleic acids are NOT DETECTED. The SARS-CoV-2 RNA is generally detectable in upper and lower respiratory specimens during the acute phase of infection. Negative results do not preclude SARS-CoV-2 infection, do not rule out co-infections with other pathogens, and should not be used as the sole basis for treatment or other patient management decisions. Negative results must be combined with clinical observations, patient history, and epidemiological information. The expected result is Negative. Fact Sheet for Patients: SugarRoll.be Fact Sheet for Healthcare Providers: https://www.woods-mathews.com/ This test is not yet approved or cleared by the [place] FDA and  has been authorized for detection and/or diagnosis of SARS-CoV-2 by FDA under an Emergency Use Authorization (EUA). This EUA will remain  in effect (meaning this test can be used) for the duration of the COVID-19 declaration under Section 56 4(b)(1)  of the Act, 21 U.S.C. section 360bbb-3(b)(1), unless the authorization is terminated or revoked sooner. Performed at [facility], [place] [address] [place], [place] [address]   Culture, blood (Routine X 2) w Reflex to ID Panel     Status: None (Preliminary result)   Collection Time: 03/02/19  2:09 AM   Specimen: BLOOD RIGHT FOREARM  Result Value Ref Range Status  Specimen Description BLOOD RIGHT FOREARM  Final   Special Requests   Final    BOTTLES DRAWN AEROBIC AND ANAEROBIC Blood Culture results may not be optimal due to an inadequate volume of blood received in culture bottles   Culture   Final    NO GROWTH 1 DAY Performed at [facility], [place] [address], [place], [place] [address]    Report Status PENDING  Incomplete  Culture, blood (Routine X 2) w Reflex to ID Panel     Status: None (Preliminary result)   Collection Time: 03/02/19  5:25 AM   Specimen: BLOOD  Result Value Ref Range Status   Specimen Description BLOOD RIGHT ARM  Final   Special Requests   Final    BOTTLES DRAWN AEROBIC AND ANAEROBIC Blood Culture adequate volume   Culture   Final    NO GROWTH 1 DAY Performed at [facility], [place] [address] [place], [place] [address]    Report Status PENDING  Incomplete         Radiology Studies: Ct Head Wo Contrast  Result Date: 03/03/2019 CLINICAL DATA:  Focal neuro deficit. Unresponsive. 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 EXAM: CT HEAD WITHOUT CONTRAST TECHNIQUE: Contiguous axial images were obtained from the base of the skull through the vertex without intravenous contrast. COMPARISON:  Head CT 03/02/2019 FINDINGS: Brain: A 10 mm hemorrhage in the right lentiform nucleus with mild surrounding edema is unchanged. Additional patchy areas of hypoattenuation involving cortex and white matter in the right temporal lobe, inferior right parietal lobe, and posteromedial right frontal lobe appears slightly more well-defined than on the prior study. No  new intracranial hemorrhage, definite new infarct, midline shift, or extra-axial fluid collection is identified. The ventricles are normal in size. Background chronic small vessel ischemic changes are present in the cerebral white matter bilaterally. Vascular: Calcified atherosclerosis at the skull base. No hyperdense vessel. Skull: No acute fracture or focal osseous lesion. Sinuses/Orbits: Old left orbital floor fracture. Mild right periorbital soft tissue swelling. Mild mucosal thickening in the paranasal sinuses. Clear mastoid air cells. Other: None. IMPRESSION: Evolving acute right cerebral infarcts primarily in the MCA territory. Unchanged 10 mm right basal ganglia hemorrhage. Electronically Signed   By: Logan Bores M.D.   On: 03/03/2019 05:21   Dg Chest Port 1 View  Result Date: 03/03/2019 CLINICAL DATA:  Respiratory failure EXAM: PORTABLE CHEST 1 VIEW COMPARISON:  03/01/2019 FINDINGS: Cardiac shadow is mildly enlarged but stable. Aortic calcifications are again seen. The lungs are well aerated bilaterally. Increased density in the lateral aspect of the right lung base is noted likely related to the patient's known pulmonary emboli. Irregularity of the fifth and sixth ribs on the left laterally is seen. These were not present on prior chest x-rays from 2016 and likely related to the patient's known history of prior lung carcinoma with mild healing. No acute fracture is seen. IMPRESSION: Slight increase in parenchymal opacity in the right lung base laterally likely related to the known underlying pulmonary emboli. Old deformities of the fifth and sixth ribs on the left likely related to the patient's known history of prior lung carcinoma. Electronically Signed   By: Inez Catalina M.D.   On: 03/03/2019 06:10   [name] Ekg Site Rite  Result Date: 03/03/2019 If Site Rite image not attached, placement could not be confirmed due to current cardiac rhythm.     Scheduled Meds: .  stroke: mapping our early  stages of recovery book  Does not apply Once  . calcium-vitamin D  1 tablet Oral Q breakfast  . methylPREDNISolone (SOLU-MEDROL) injection  40 mg Intravenous Q12H  . mometasone-formoterol  2 puff Inhalation BID  . nicotine  21 mg Transdermal Daily  . omega-3 acid ethyl esters  1 g Oral Daily  . sodium chloride flush  10-40 mL Intracatheter Q12H  . umeclidinium bromide  2 puff Inhalation Daily   Continuous Infusions: . sodium chloride 100 mL/hr at 03/04/19 0811  . heparin 2,000 Units/hr (03/04/19 1224)     LOS: 3 days      Debbe Odea, MD [facility] Pager: [email or web address] Password [email or web address] 03/04/2019, 1:43 PM

## 2019-03-04 NOTE — Progress Notes (Signed)
NAME:  [name], MRN:  [id number], DOB:  18-Oct-1945, LOS: 3 ADMISSION DATE:  03/01/2019, CONSULTATION DATE:  03/03/2019 REFERRING MD:  [name] CHIEF COMPLAINT:  AMS, respiratory insufficiency      History of present illness   73yo F PMH HIV, adenocarcinoma of LUL s/p radiation therapy, HTN, HLD, COPD, chronic respiratory failure on 4 L/min oxygen at baseline, asthma, HBV, tobacco abuse, CKD III admitted 9/2 after a fall with generalized weakness and CP. Diagnosed with pulmonary emboli and emboli in the aortic arch with right renal infarcts and a splenic infarct.  Subsequent CT of the head after admission due to presenting weakness and falls revealed acute right MCA territory ischemic infarct with 10 mm hemorrhagic conversion in the right basal ganglia.   9/4 0400 RRT called for altered mental status and obtundation.  Sent for head CT with no acute change.  The patient had received morphine overnight.  PCCM was contacted who recommended Narcan be given.  After that the patient would arouse but not follow commands.   PCCM subsequently consulted to bedside for altered mental status and hypotension and further recommendations.  The patient received IV fluids with improvement in blood pressure as well as improved mentation however this was intermittent.  ABG obtained revealed hypercarbia.  BiPAP ordered.   Of note the patient was admitted from 8/15-8/21/2020 for acute respiratory failure due to COPD and overmedications and her Robaxin, Gabapentin and Seroquel. She required a BiPAP. She was discharged home on 4 L O2 and a CPAP. Past Medical History  HIV adenocarcinoma of LUL s/p radiation therapy HTN HLD COPD, chronic respiratory failure on 4 L/min oxygen at baseline Asthma HBV tobacco abuse CKD III   Significant Hospital Events   9/4 RRT   Consults:  Neurology PCCM   Procedures:  NA  Significant Diagnostic Tests:  9/2 CTA Chest/abdomen/pelvis IMPRESSION: Pulmonary emboli and  emboli in the aortic arch with right renal infarcts and a splenic infarct. Findings raise the possibility for ventricular or atrial septal defect. No evidence of trauma. Fatty infiltration of the liver. Atherosclerosis. Emphysema.  9/3 CT head IMPRESSION: 1. Acute right MCA territory ischemic infarct. 10 mm hemorrhage in the right basal ganglia. No midline shift or hydrocephalus.  9/4 CT head IMPRESSION: Evolving acute right cerebral infarcts primarily in the MCA territory. Unchanged 10 mm right basal ganglia hemorrhage.   9/3 Echo IMPRESSIONS  1. The left ventricle has hyperdynamic systolic function, with an ejection fraction of >65%. The cavity size was normal. There is mildly increased left ventricular wall thickness. Left ventricular diastolic Doppler parameters are consistent with  impaired relaxation.  2. The right ventricle has normal systolic function. The cavity was normal.  3. The mitral valve is abnormal. Mild thickening of the mitral valve leaflet.  4. The tricuspid valve is grossly normal.  5. The aortic valve is tricuspid. Mild calcification of the aortic valve. No stenosis of the aortic valve.  6. The aorta is normal unless otherwise noted.  7. Vigorous LV systolic function; mild LVH; grade 1 diastolic dysfunction; trace TR; moderate pulmonary hypertension.   9/3 Carotid duplex Summary: Right Carotid: Velocities in the right ICA are consistent with a 1-39% stenosis.  Left Carotid: Velocities in the left ICA are consistent with a 1-39% stenosis.  Vertebrals: Bilateral vertebral arteries demonstrate antegrade flow.  9/4 Lower EXT duplex Summary: Right: There is no evidence of deep vein thrombosis in the lower extremity. No cystic structure found in the popliteal fossa. Left: There is  no evidence of deep vein thrombosis in the lower extremity. No cystic structure found in the popliteal fossa.      Micro Data:  SARS Coronavirus 2 negative  9/2  Urine>> Aerococcus species  9/3 Blood >> Antimicrobials:  Atripla>>chronic   Interim history/subjective:   Off  BiPAP overnight  Objective   Blood pressure (!) 153/71, pulse 88, temperature 98.2 F (36.8 C), resp. rate 18, height 5\' 9"  (1.753 m), weight 100.7 kg, SpO2 98 %.    FiO2 (%):  [60 %] 60 %   Intake/Output Summary (Last 24 hours) at 03/04/2019 1500 Last data filed at 03/04/2019 0800 Gross per 24 hour  Intake 1154.85 ml  Output 425 ml  Net 729.85 ml   Filed Weights   03/01/19 2000  Weight: 100.7 kg    Examination: General: Chronically ill-appearing, oob, agitated HENT: Normocephalic.  Dry mucus membranes Neck: No JVD. Trachea midline. No thyromegaly, no lymphadenopathy CV: RRR. S1S2. No MRG. +2 distal pulses Lungs: decreased Bs BL ABD: soft, non tender GU: No Foley EXT: MAE well. No edema Skin: Pale, warm, dry.  Anterior surfaces in tact. No rashes or lesions Neuro: agitated, confused, non focal   CXR 9/4 reviewed  Resolved Hospital Problem list   NA  Assessment & Plan:  Acute hypercarbic respiratory failure in setting of known COPD and chronic hypoxic respiratory failure secondary to altered mental status. She does not appear to be in acute COPD exacerbation. OSA on home CPAP P BiPAP prn only Use haldol prn agitation- try to avoid other sedatives   Acute ischemic and hemorrhagic stroke P Per neurology  Acute PE - no lower extremity DVT noted. Also has aortic thrombus nml RV function on echo P trying IV heparin  DNR noted   PCCM available as needed  Kara Mead MD. FCCP. [facility] Pager [phone] If no response call [phone]   03/04/2019

## 2019-03-04 NOTE — Evaluation (Signed)
Clinical/Bedside Swallow Evaluation Patient Details  Name: [name] MRN: [id number] Date of Birth: 04/21/1946  Today's Date: 03/04/2019 Time: SLP Start Time (ACUTE ONLY): 1330 SLP Stop Time (ACUTE ONLY): 1355 SLP Time Calculation (min) (ACUTE ONLY): 25 min  Past Medical History:  Past Medical History:  Diagnosis Date  . Asthma   . Atypical squamous cells of undetermined significance (ASCUS) on Papanicolaou smear of cervix   . COPD (chronic obstructive pulmonary disease) ([place])   . H/O drainage of abscess    left axilla, from left breast  . Hep B w/o coma   . HIV (human immunodeficiency virus infection) ([place])   . Hyperlipidemia   . Lung cancer ([place]) 12/14/13   LUL Adenocarcinoma  . Neuromuscular disorder ([place])    fingers/feet neuropathy  . Non-small cell lung cancer ([place])   . S/P radiation therapy 01/26/14-02/02/14   sbrt  lt upper lung-54Gy/81fx   Past Surgical History:  Past Surgical History:  Procedure Laterality Date  . ABCESS DRAINAGE     abcess under Left arm, abcess from L breast  . removal of abnormal cells on uterus    . thyroid gland removed     HPI:  Pt is a 73 y.o. 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 CT of the head revealed acute right MCA territory ischemic infarct. 10 mm hemorrhage in the right basal ganglia.    Assessment / Plan / Recommendation Clinical Impression  Patient had a respiratory setback on 9/4 requiring Bipap usage. She refused Bipap in the middle of night and was switched to nasal canula. RN reported patient was confused and agitated at times throughout the night (no sleep) and continues to be confused and agitated at times. Patient called out with pain in her eyes, later in her feet and finally in her back. However it seemed to pass  after she acknowledged it.  She was uanble to participate in an oral motor evaluation, but Oral motor observed to be generally weak during PO intake (ice chips, water and puree). Slight oral holding with all trials, but timely swallow initiation with no throat clearing, coughing or vocal changes. Pt had no respiratory changes during eval. Patient seemed to be calmed when eating and drinking, however will require full assistance to ensure safe po intake due to confusion. Recommend Dys 2, thin liquid diet. Medication crushed in puree. ST to follow with aspiration precaution training, diet tolerance and upgrade.  SLP Visit Diagnosis: Dysphagia, oral phase (R13.11)    Aspiration Risk  Mild aspiration risk(confusion)    Diet Recommendation Dysphagia 2 (Fine chop);Thin liquid   Liquid Administration via: Cup;Straw Medication Administration: Crushed with puree Supervision: Full supervision/cueing for compensatory strategies Compensations: Slow rate;Small sips/bites(feed when fully alert) Postural Changes: Seated upright at 90 degrees    Other  Recommendations Oral Care Recommendations: Oral care BID   Follow up Recommendations        Frequency and Duration min 2x/week  2 weeks       Prognosis Prognosis for Safe Diet Advancement: Good Barriers to Reach Goals: Cognitive deficits      Swallow Study   General Date of Onset: 03/02/19 HPI: Pt is a 73 y.o. 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 CT of the head revealed acute right MCA territory ischemic infarct. 10 mm hemorrhage in the right basal ganglia.  Type of Study: Bedside Swallow Evaluation Previous Swallow Assessment: none Diet Prior to this Study: NPO Temperature Spikes Noted: No Respiratory Status: Nasal cannula History  of Recent Intubation: No Behavior/Cognition: Alert;Confused;Agitated Oral Cavity Assessment: Within Functional Limits Oral Care Completed by SLP: No Oral Cavity - Dentition: Missing dentition Vision: Functional for self-feeding Self-Feeding Abilities: Total assist Patient Positioning: Upright in bed Baseline Vocal Quality: Normal Volitional Cough: Cognitively unable to elicit Volitional Swallow: Unable to elicit    Oral/Motor/Sensory Function Overall Oral Motor/Sensory Function: Generalized oral weakness   Ice Chips Ice chips: Within functional limits Presentation: Spoon   Thin Liquid Thin Liquid: Within functional limits Presentation: Cup;Straw    Nectar Thick Nectar Thick Liquid: Not tested   Honey Thick Honey Thick Liquid: Not tested   Puree Puree: Within functional limits Presentation: Spoon   Solid     Solid: Not tested(patient refused )      Michelle Cousins Annlouise Gerety, MA, CCC-SLP 03/04/2019 2:53 PM

## 2019-03-04 NOTE — Plan of Care (Signed)
Patient will tolerate recommended consistencies with no s/sx of aspiration.

## 2019-03-04 NOTE — Evaluation (Signed)
Physical Therapy Evaluation Patient Details Name: [name] MRN: [id number] DOB: 02/01/1946 Today's Date: 03/04/2019   History of Present Illness  73yo F dmitted 9/2 after a fall with generalized weakness and CP.  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 As of 9/4, pt with AMS CT H is evolving.PMH HIV, adenocarcinoma of LUL s/p radiation therapy, HTN, HLD, COPD, chronic respiratory failure on 4 L/min oxygen at baseline, asthma, HBV, tobacco abuse, CKD III.  Clinical Impression  Pt admitted with/for generalized weakness and chest pain, but found to have suffered a stroke per CT.  Presently pt needing mod assist or more of 1-2 persons depending on task.  Pt currently limited functionally due to the problems listed. ( See problems list.)   Pt will benefit from PT to maximize function and safety in order to get ready for next venue listed below.     Follow Up Recommendations SNF;Supervision/Assistance - 24 hour    Equipment Recommendations  None recommended by PT    Recommendations for Other Services       Precautions / Restrictions Precautions Precautions: Fall;Other (comment) Precaution Comments: watch O2 Restrictions Weight Bearing Restrictions: No      Mobility  Bed Mobility Overal bed mobility: Needs Assistance Bed Mobility: Supine to Sit;Sit to Sidelying     Supine to sit: Min assist   Sit to sidelying: Min assist;Mod assist General bed mobility comments: pt able to move in bed, minimal assist for direction and safety  Transfers Overall transfer level: Needs assistance Equipment used: None;Ambulation equipment used Transfers: Sit to/from [facility] Sit to Stand: Mod assist Stand pivot transfers: Mod assist;+2 safety/equipment       General transfer comment: assist for guidance and safety since pt unable to focus on task.  Ambulation/Gait Ambulation/Gait  assistance: Mod assist;+2 physical assistance;+2 safety/equipment Gait Distance (Feet): 15 Feet(x2 to/from bathroom) Assistive device: IV Pole;1 person hand held assist;2 person hand held assist Gait Pattern/deviations: Step-to pattern;Step-through pattern;Decreased stride length Gait velocity: decreased   General Gait Details: On 6 L sats at >=98% on 4L sats at 95%, EHR in 80's.  pt's gait and balance unsteady and staggery at best. pt unable to focus on task or easily follow direction.  Assist for stability and direction.  Stairs            Wheelchair Mobility    Modified Rankin (Stroke Patients Only) Modified Rankin (Stroke Patients Only) Pre-Morbid Rankin Score: Moderate disability Modified Rankin: Moderately severe disability     Balance Overall balance assessment: Needs assistance   Sitting balance-Leahy Scale: Fair Sitting balance - Comments:  pt able to sit without UE assist, but needs guard for safety   Standing balance support: Single extremity supported;Bilateral upper extremity supported Standing balance-Leahy Scale: Poor Standing balance comment: not focused enough for safety without external assist                             Pertinent Vitals/Pain Pain Assessment: Faces Faces Pain Scale: Hurts little more Pain Location: vague, suspect chest Pain Intervention(s): Monitored during session    Home Living Family/patient expects to be discharged to:: Private residence Living Arrangements: Alone Available Help at Discharge: Family;Available PRN/intermittently Type of Home: House Home Access: Stairs to enter   Entrance Stairs-Number of Steps: 1 Home Layout: One level Home Equipment: Walker - 2 wheels;Tub bench;Hand held shower head Additional Comments:  Information taken from previous hospital stay as pt unable to relay information in current state of AMS and poor attention.    Prior Function Level of Independence: Independent with assistive  device(s)         Comments: used walker when she would go out; struggling wtih IADL's (cooking/cleaning);  reports she is legally blind     Hand Dominance   Dominant Hand: Right    Extremity/Trunk Assessment   Upper Extremity Assessment Upper Extremity Assessment: Defer to OT evaluation    Lower Extremity Assessment Lower Extremity Assessment: Generalized weakness(able to bear weight/amb., but uncoordinated)       Communication   Communication: No difficulties  Cognition Arousal/Alertness: Awake/alert;Lethargic Behavior During Therapy: Restless Overall Cognitive Status: Impaired/Different from baseline Area of Impairment: Orientation;Following commands;Attention;Safety/judgement;Awareness                 Orientation Level: Situation;Time;Place Current Attention Level: Focused   Following Commands: Follows one step commands inconsistently Safety/Judgement: Decreased awareness of safety;Decreased awareness of deficits Awareness: Intellectual          General Comments      Exercises     Assessment/Plan    PT Assessment Patient needs continued PT services  PT Problem List Decreased strength;Decreased activity tolerance;Decreased balance;Decreased mobility;Decreased coordination;Decreased knowledge of use of DME;Decreased safety awareness;Decreased cognition       PT Treatment Interventions DME instruction;Therapeutic activities;Balance training;Patient/family education;Therapeutic exercise;Functional mobility training;Gait training    PT Goals (Current goals can be found in the Care Plan section)  Acute Rehab PT Goals PT Goal Formulation: Patient unable to participate in goal setting Time For Goal Achievement: 03/18/19 Potential to Achieve Goals: Fair    Frequency Min 3X/week   Barriers to discharge        Co-evaluation               AM-PAC PT "6 Clicks" Mobility  Outcome Measure Help needed turning from your back to your side while in  a flat bed without using bedrails?: A Little Help needed moving from lying on your back to sitting on the side of a flat bed without using bedrails?: A Lot Help needed moving to and from a bed to a chair (including a wheelchair)?: A Lot Help needed standing up from a chair using your arms (e.g., wheelchair or bedside chair)?: A Lot Help needed to walk in hospital room?: A Lot Help needed climbing 3-5 steps with a railing? : A Lot 6 Click Score: 13    End of Session Equipment Utilized During Treatment: Oxygen;Gait belt Activity Tolerance: Patient tolerated treatment well Patient left: in bed;with call bell/phone within reach;with bed alarm set Nurse Communication: Mobility status PT Visit Diagnosis: Unsteadiness on feet (R26.81);Other symptoms and signs involving the nervous system (R29.898);Other abnormalities of gait and mobility (R26.89)    Time: 1111-1140 PT Time Calculation (min) (ACUTE ONLY): 29 min   Charges:   PT Evaluation $PT Eval Moderate Complexity: 1 Mod          03/04/2019  Donnella Sham, PT Acute Rehabilitation Services [phone]  (pager) [phone]  (office)  Tessie Fass Caraline Deutschman 03/04/2019, 12:56 PM

## 2019-03-04 NOTE — Progress Notes (Signed)
ANTICOAGULATION CONSULT NOTE - Follow Up Consult  Pharmacy Consult for heparin Indication: pulmonary embolus in setting of ischemic stroke, aortic arch thrombus, and small basal ganglia hemorrhage  Labs: Recent Labs    03/01/19 1330 03/02/19 0209 03/02/19 0525 03/03/19 1625 03/04/19 0324 03/04/19 0332  HGB 15.1* 14.2 14.4  --   --  13.3  HCT 55.0* 53.9* 53.7*  --   --  50.0*  PLT 149* 142* 137*  --   --  PENDING  HEPARINUNFRC  --   --  0.34 0.18* 0.46  --   CREATININE 1.31*  --  1.04* 0.86  --  0.85  CKTOTAL 382*  --   --   --   --   --   TROPONINIHS  --  117* 128*  --   --   --     Assessment/Plan:  73yo female therapeutic on heparin after rate change. Will continue gtt at current rate and confirm stable with additional level.   Wynona Neat, PharmD, BCPS  03/04/2019,4:45 AM

## 2019-03-04 NOTE — Progress Notes (Signed)
ANTICOAGULATION CONSULT NOTE  Pharmacy Consult:  Heparin Indication: pulmonary embolus  No Known Allergies  Patient Measurements: Height: 5\' 9"  (175.3 cm) Weight: 222 lb 0.1 oz (100.7 kg) IBW/kg (Calculated) : 66.2 Heparin Dosing Weight: 88 kg  Vital Signs: Temp: 97.6 F (36.4 C) (09/05 0700) Temp Source: Axillary (09/05 0700) BP: 142/65 (09/05 0700) Pulse Rate: 79 (09/05 0325)  Labs: Recent Labs    03/01/19 1330 03/02/19 0209  03/02/19 0525 03/03/19 1625 03/04/19 0324 03/04/19 0332 03/04/19 0925  HGB 15.1* 14.2  --  14.4  --   --  13.3  --   HCT 55.0* 53.9*  --  53.7*  --   --  50.0*  --   PLT 149* 142*  --  137*  --   --  115*  --   HEPARINUNFRC  --   --    < > 0.34 0.18* 0.46  --  0.19*  CREATININE 1.31*  --   --  1.04* 0.86  --  0.85  --   CKTOTAL 382*  --   --   --   --   --   --   --   TROPONINIHS  --  117*  --  128*  --   --   --   --    < > = values in this interval not displayed.    Estimated Creatinine Clearance: 76.7 mL/min (by C-G formula based on SCr of 0.85 mg/dL).   Assessment: 73 yr old female presented S/P fall (slid to floor from bed), hip pain. 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 Pharmacy consulted to dose heparin for PE.  Heparin was turned off 03/02/19 PM due to concern for stroke with hemorrhagic conversion. Neurology believes hemorrhagic component is older and have ordered to restart IV heparin.  Early this morning, patient had AMS with hypotension, thought due to narcotic since patient responded to Narcan and IVF.  Heparin was turned off again while awaiting repeat head CT 9/4. CT is stable with unchanged hemorrhage. Dr. [name] was okay to resume IV heparin.  A confirmatory level this morning resulted as SUBtherapeutic (HL 0.19 << 0.46, goal of 0.3-0.5). No off time noted, IV site infusing, and no bleeding issues per discussion with RN Juliann Pulse).    Goal of Therapy:  Heparin level 0.3 - 0.5 units/ml Monitor  platelets by anticoagulation protocol: Yes   Plan:  - Increase Heparin drip rate to 2000 units/hr (20 ml/hr) - Will continue to monitor for any signs/symptoms of bleeding and will follow up with heparin level in 8 hours   Thank you for allowing pharmacy to be a part of this patient's care.  Alycia Rossetti, PharmD, BCPS Clinical Pharmacist Clinical phone for 03/04/2019: [phone] 03/04/2019 11:37 AM   **Pharmacist phone directory can now be found on [email or web address] ([email or web address]).  Listed under [place].

## 2019-03-04 NOTE — Progress Notes (Signed)
Daily Progress Note   Patient Name: [name]       Date: 03/04/2019 DOB: May 15, 1946  Age: 73 y.o. MRN#: [id number] Attending Physician: Debbe Odea, MD Primary Care Physician: System, Provider Not In Admit Date: 03/01/2019  Reason for Consultation/Follow-up: Establishing goals of care, Pain control and Psychosocial/spiritual support  Subjective: Patient is slow to respond but can tell me her name.  She states she is having pain in her left leg, back and chest.  She is agitated, moving in bed and complaining of pain.  I met with 6 family members in person and two additional members on the phone.  The group agreed brother [name] is the medical decision maker and speaks for the family.  BJ does not make decisions without involving the rest of the family.  As a group we discussed her history of lung cancer, COPD, OSA, HIV, CKD3, and pulmonary HTN.  Then we talked about this admission - clots to the brain, lung, kidney, spleen, and an aortic thrombus.  I warned of the likelihood of future clots / future strokes.  We reviewed and completed a MOST form:   The family choose - 1.  DNR (If she arrests).  2.  Full scope treatment if she has a pulse or is breathing.  3.  Will do a trial feeding tube if needed.  We discussed the PT results and the likelihood that she will need SNF placement.  Family requests [facility] if possible as the patient's sister [name] is at [employer]    Assessment: Patient awake and moving, complaining of pain.  Unable to answer anything but the most simple questions (what is your name?  Are you comfortable or in pain?).  Has a strong propensity for respiratory decompensation with even small amounts of narcotic pain medication.   Patient  Profile/HPI:  73 y.o. female  with past medical history of LUL NSCL CA s/p radiation (2015), COPD, CKD 3, and HIV who was admitted on 03/01/2019 after a fall with weakness and chest pain.  She was found to have multiple clots causing a PE, right MCA CVA, renal infarct and splenic infarct. 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  Currently the patient is hypercapnic and on bipap.       Length of Stay:  3  Current Medications: Scheduled Meds:  .  stroke: mapping our early stages of recovery book   Does not apply Once  . acetaminophen  650 mg Oral Q8H  . calcium-vitamin D  1 tablet Oral Q breakfast  . DULoxetine  20 mg Oral Daily  . [START ON 03/05/2019] methylPREDNISolone (SOLU-MEDROL) injection  40 mg Intravenous Daily  . mometasone-formoterol  2 puff Inhalation BID  . nicotine  21 mg Transdermal Daily  . omega-3 acid ethyl esters  1 g Oral Daily  . sodium chloride flush  10-40 mL Intracatheter Q12H  . umeclidinium bromide  2 puff Inhalation Daily    Continuous Infusions: . heparin 2,000 Units/hr (03/04/19 1224)    PRN Meds: acetaminophen **OR** acetaminophen, albuterol, methocarbamol, naLOXone (NARCAN)  injection, ondansetron **OR** ondansetron (ZOFRAN) IV, oxyCODONE, sodium chloride flush  Physical Exam        Well developed female lying sideways in bed.  In wrist restraints, awake complaining of pain. CV irreg resp no distress on n/c Abdomen soft, nt, nd Orientated to person.    Vital Signs: BP (!) 153/71 (BP Location: Left Arm)   Pulse 90   Temp 98.2 F (36.8 C)   Resp (!) 22   Ht '5\' 9"'$  (1.753 m)   Wt 100.7 kg   SpO2 99%   BMI 32.78 kg/m  SpO2: SpO2: 99 % O2 Device: O2 Device: Nasal Cannula O2 Flow Rate: O2 Flow Rate (L/min): 3 L/min  Intake/output summary:   Intake/Output Summary (Last 24 hours) at 03/04/2019 1505 Last data filed at 03/04/2019 0800 Gross per 24 hour  Intake 1154.85 ml  Output 425 ml  Net 729.85 ml   LBM: Last BM Date: 03/04/19  Baseline Weight: Weight: 100.7 kg Most recent weight: Weight: 100.7 kg       Palliative Assessment/Data: 40%      Patient Active Problem List   Diagnosis Date Noted  . Altered mental status   . Acute pulmonary embolism without acute cor pulmonale (HCC)   . Acute ischemic right MCA stroke ([place])   . Palliative care encounter   . Aortic embolism or thrombosis ([place])   . CVA (cerebral vascular accident) ([place]) 03/02/2019  . COPD (chronic obstructive pulmonary disease) ([place]) 03/01/2019  . PE (pulmonary thromboembolism) ([place]) 03/01/2019  . Tobacco abuse 03/01/2019  . 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 CKD (chronic kidney disease), stage III ([place]) 03/01/2019  . Generalized weakness 03/01/2019  . Fall at home   . 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([place])   . Splenic infarct   . Acute and chronic respiratory failure (acute-on-chronic) ([place]) 02/11/2019  . Sepsis ([place]) 03/28/2015  . CAP (community acquired pneumonia) 03/28/2015  . UTI (lower urinary tract infection) 03/28/2015  . Respiratory failure ([place]) 03/28/2015  . Non-small cell lung cancer ([place])   . Malignant neoplasm of upper lobe, bronchus or lung 01/03/2014  . Acute hypoxemic respiratory failure ([place]) 10/31/2013  . COPD with acute exacerbation ([place]) 10/30/2013  . COPD exacerbation ([place]) 10/30/2013  . Other and unspecified noninfectious gastroenteritis and colitis(558.9) 11/20/2012  . Rectal bleed 11/19/2012  . Obesity 05/05/2012  . Renal insufficiency 05/05/2012  . Hyperglycemia 05/05/2012  . Asthma 12/10/2011  . Cigarette smoker 12/10/2011  . Hyperlipidemia 12/11/2010  . Hypertension 12/11/2010  . ALLERGIC RHINITIS, SEASONAL 07/03/2010  . COPD 07/03/2010  . Human immunodeficiency virus (HIV) disease ([place]) 12/09/2006  . DEPRESSION 12/09/2006  . BREAST MASS, BENIGN 12/09/2006  . OSTEOPOROSIS 12/09/2006    Palliative Care Plan    Recommendations/Plan: Completed  a MOST form:   The family choose - 1.  DNR (If she arrests).  2.  Full  scope treatment if she has a pulse or is breathing.  3.  Will do a trial feeding tube if needed.  Discussed with Dr. Wynelle Day will trial scheduled tylenol, cymbalta, and very low dose oxy IR bid PRN - while monitoring closely for lethargy.  PMT will follow continue to follow with you.    Goals of Care and Additional Recommendations:  Limitations on Scope of Treatment: Full Scope Treatment  Code Status:  DNR  Prognosis:   Unable to determine, but concerned for recurrent strokes given aortic thrombus and possible hyper coagulability.   Discharge Planning:  [facility] for rehab with Palliative care service follow-up  Care plan was discussed with attending MD, Neurology, RN, Family.  Thank you for allowing the Palliative Medicine Team to assist in the care of this patient.  Total time spent:  120 min. Time in 1:00 Time out 3:00      Greater than 50%  of this time was spent counseling and coordinating care related to the above assessment and plan.  Florentina Jenny, PA-C Palliative Medicine  Please contact Palliative MedicineTeam phone at [phone] for questions and concerns between 7 am - 7 pm.   Please see AMION for individual provider pager numbers.

## 2019-03-04 NOTE — Progress Notes (Signed)
ANTICOAGULATION CONSULT NOTE  Pharmacy Consult:  Heparin Indication: pulmonary embolus  No Known Allergies  Patient Measurements: Height: 5\' 9"  (175.3 cm) Weight: 222 lb 0.1 oz (100.7 kg) IBW/kg (Calculated) : 66.2 Heparin Dosing Weight: 88 kg  Vital Signs: Temp: 97.7 F (36.5 C) (09/05 1938) Temp Source: Oral (09/05 1938) BP: 157/79 (09/05 1938) Pulse Rate: 84 (09/05 1938)  Labs: Recent Labs    03/02/19 0209  03/02/19 0525 03/03/19 1625 03/04/19 0324 03/04/19 0332 03/04/19 0925 03/04/19 2037  HGB 14.2  --  14.4  --   --  13.3  --   --   HCT 53.9*  --  53.7*  --   --  50.0*  --   --   PLT 142*  --  137*  --   --  115*  --   --   HEPARINUNFRC  --    < > 0.34 0.18* 0.46  --  0.19* 0.63  CREATININE  --   --  1.04* 0.86  --  0.85  --   --   TROPONINIHS 117*  --  128*  --   --   --   --   --    < > = values in this interval not displayed.    Estimated Creatinine Clearance: 76.7 mL/min (by C-G formula based on SCr of 0.85 mg/dL).   Assessment: 73 yr old female presented S/P fall (slid to floor from bed), hip pain. 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 Pharmacy consulted to dose heparin for PE.  Heparin was turned off 03/02/19 PM due to concern for stroke with hemorrhagic conversion. Neurology believes hemorrhagic component is older and have ordered to restart IV heparin.  Early this morning, patient had AMS with hypotension, thought due to narcotic since patient responded to Narcan and IVF.  Heparin was turned off again while awaiting repeat head CT 9/4. CT is stable with unchanged hemorrhage. Dr. [name] was okay to resume IV heparin.  Repeat heparin level this evening now above goal at 0.63. No S/Sx bleeding per RN.  Goal of Therapy:  Heparin level 0.3 - 0.5 units/ml Monitor platelets by anticoagulation protocol: Yes   Plan:  -Reduce heparin to 1900 units/hr -Recheck heparin level with morning labs  Arrie Senate, PharmD,  BCPS Clinical Pharmacist [phone] Please check AMION for all [place] numbers 03/04/2019

## 2019-03-04 NOTE — Progress Notes (Signed)
STROKE TEAM PROGRESS NOTE   INTERVAL HISTORY Patient lying in bed, crying for help and wanted to sit up.  Speech therapist and I sat her up, and then she was asking to lie down.  And when we laid her down, she was crying for help again.  She complains of pain all over the body.  Not cooperated on exam.  Still on heparin IV.  Vitals:   03/03/19 2002 03/03/19 2254 03/03/19 2352 03/04/19 0325  BP:  129/73 (!) 136/59 133/66  Pulse: 78 84 85 79  Resp: 13 19 (!) 23 18  Temp:    97.8 F (36.6 C)  TempSrc:    Axillary  SpO2: 95% 92% 94% 98%  Weight:      Height:        CBC:  Recent Labs  Lab 03/01/19 1330  03/02/19 0525 03/04/19 0332  WBC 11.6*   < > 9.6 6.9  NEUTROABS 8.0*  --   --   --   HGB 15.1*   < > 14.4 13.3  HCT 55.0*   < > 53.7* 50.0*  MCV 86.2   < > 88.5 89.1  PLT 149*   < > 137* 115*   < > = values in this interval not displayed.    Basic Metabolic Panel:  Recent Labs  Lab 03/03/19 1625 03/04/19 0332  NA 142 140  K 3.9 4.2  CL 105 104  CO2 29 25  GLUCOSE 89 96  BUN 8 8  CREATININE 0.86 0.85  CALCIUM 8.2* 8.2*   Lipid Panel:     Component Value Date/Time   CHOL 77 03/03/2019 0500   TRIG 70 03/03/2019 0500   HDL 15 (L) 03/03/2019 0500   CHOLHDL 5.1 03/03/2019 0500   VLDL 14 03/03/2019 0500   LDLCALC 48 03/03/2019 0500   HgbA1c:  Lab Results  Component Value Date   HGBA1C 6.4 (H) 03/03/2019   Urine Drug Screen: No results found for: LABOPIA, COCAINSCRNUR, LABBENZ, AMPHETMU, THCU, LABBARB  Alcohol Level No results found for: [facility]  IMAGING Ct Head Wo Contrast  Result Date: 03/03/2019 CLINICAL DATA:  Focal neuro deficit. Unresponsive. 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 EXAM: CT HEAD WITHOUT CONTRAST TECHNIQUE: Contiguous axial images were obtained from the base of the skull through the vertex without intravenous contrast. COMPARISON:  Head CT 03/02/2019 FINDINGS: Brain: A 10 mm hemorrhage in the right lentiform nucleus with mild surrounding  edema is unchanged. Additional patchy areas of hypoattenuation involving cortex and white matter in the right temporal lobe, inferior right parietal lobe, and posteromedial right frontal lobe appears slightly more well-defined than on the prior study. No new intracranial hemorrhage, definite new infarct, midline shift, or extra-axial fluid collection is identified. The ventricles are normal in size. Background chronic small vessel ischemic changes are present in the cerebral white matter bilaterally. Vascular: Calcified atherosclerosis at the skull base. No hyperdense vessel. Skull: No acute fracture or focal osseous lesion. Sinuses/Orbits: Old left orbital floor fracture. Mild right periorbital soft tissue swelling. Mild mucosal thickening in the paranasal sinuses. Clear mastoid air cells. Other: None. IMPRESSION: Evolving acute right cerebral infarcts primarily in the MCA territory. Unchanged 10 mm right basal ganglia hemorrhage. Electronically Signed   By: Logan Bores M.D.   On: 03/03/2019 05:21   Dg Chest Port 1 View  Result Date: 03/03/2019 CLINICAL DATA:  Respiratory failure EXAM: PORTABLE CHEST 1 VIEW COMPARISON:  03/01/2019 FINDINGS: Cardiac shadow is mildly enlarged but stable. Aortic calcifications are again  seen. The lungs are well aerated bilaterally. Increased density in the lateral aspect of the right lung base is noted likely related to the patient's known pulmonary emboli. Irregularity of the fifth and sixth ribs on the left laterally is seen. These were not present on prior chest x-rays from 2016 and likely related to the patient's known history of prior lung carcinoma with mild healing. No acute fracture is seen. IMPRESSION: Slight increase in parenchymal opacity in the right lung base laterally likely related to the known underlying pulmonary emboli. Old deformities of the fifth and sixth ribs on the left likely related to the patient's known history of prior lung carcinoma. Electronically  Signed   By: Inez Catalina M.D.   On: 03/03/2019 06:10   Vas US Carotid (at [place] Only)  Result Date: 03/02/2019 Carotid Arterial Duplex Study Indications:       CVA. Risk Factors:      None. Limitations        Today's exam was limited due to the body habitus of the                    patient, patient movement, patient positioning and the                    patient's respiratory variation. Comparison Study:  No prior studies. Performing Technologist: Oliver Hum RVT  Examination Guidelines: A complete evaluation includes B-mode imaging, spectral Doppler, color Doppler, and power Doppler as needed of all accessible portions of each vessel. Bilateral testing is considered an integral part of a complete examination. Limited examinations for reoccurring indications may be performed as noted.  Right Carotid Findings: +----------+--------+--------+--------+-----------------------+--------+           PSV cm/sEDV cm/sStenosisPlaque Description     Comments +----------+--------+--------+--------+-----------------------+--------+ CCA Prox  68      13              smooth and heterogenous         +----------+--------+--------+--------+-----------------------+--------+ CCA Distal65      11              smooth and heterogenous         +----------+--------+--------+--------+-----------------------+--------+ ICA Prox  58      14              smooth and heterogenoustortuous +----------+--------+--------+--------+-----------------------+--------+ ICA Distal57      22                                     tortuous +----------+--------+--------+--------+-----------------------+--------+ ECA       60      11                                              +----------+--------+--------+--------+-----------------------+--------+ +----------+--------+-------+--------+-------------------+           PSV cm/sEDV cmsDescribeArm Pressure (mmHG)  +----------+--------+-------+--------+-------------------+ [id number]                                        +----------+--------+-------+--------+-------------------+ +---------+--------+--+--------+--+---------+ VertebralPSV cm/s73EDV cm/s24Antegrade +---------+--------+--+--------+--+---------+  Left Carotid Findings: +----------+--------+--------+--------+-----------------------+--------+           PSV cm/sEDV cm/sStenosisPlaque Description     Comments +----------+--------+--------+--------+-----------------------+--------+  CCA Prox  101     19              smooth and heterogenous         +----------+--------+--------+--------+-----------------------+--------+ CCA Distal79      16              smooth and heterogenous         +----------+--------+--------+--------+-----------------------+--------+ ICA Prox  80      25              smooth and heterogenoustortuous +----------+--------+--------+--------+-----------------------+--------+ ICA Distal60      29                                     tortuous +----------+--------+--------+--------+-----------------------+--------+ ECA       66                                                      +----------+--------+--------+--------+-----------------------+--------+ +----------+--------+--------+--------+-------------------+           PSV cm/sEDV cm/sDescribeArm Pressure (mmHG) +----------+--------+--------+--------+-------------------+ [id number]                                         +----------+--------+--------+--------+-------------------+ +---------+--------+--+--------+--+---------+ VertebralPSV cm/s72EDV cm/s27Antegrade +---------+--------+--+--------+--+---------+  Summary: Right Carotid: Velocities in the right ICA are consistent with a 1-39% stenosis. Left Carotid: Velocities in the left ICA are consistent with a 1-39% stenosis. Vertebrals: Bilateral vertebral arteries demonstrate  antegrade flow. *See table(s) above for measurements and observations.  Electronically signed by Antony Contras MD on 03/02/2019 at 1:16:38 PM.    Final    Vas [name] Lower Extremity Venous (dvt)  Result Date: 03/02/2019  Lower Venous Study Indications: Pulmonary embolism, and stroke.  Risk Factors: None identified. Limitations: Body habitus, poor ultrasound/tissue interface and patient movement, patient positioning. Comparison Study: No prior studies. Performing Technologist: Oliver Hum RVT  Examination Guidelines: A complete evaluation includes B-mode imaging, spectral Doppler, color Doppler, and power Doppler as needed of all accessible portions of each vessel. Bilateral testing is considered an integral part of a complete examination. Limited examinations for reoccurring indications may be performed as noted.  +---------+---------------+---------+-----------+----------+--------------+ RIGHT    CompressibilityPhasicitySpontaneityPropertiesThrombus Aging +---------+---------------+---------+-----------+----------+--------------+ CFV      Full           Yes      Yes                                 +---------+---------------+---------+-----------+----------+--------------+ SFJ      Full                                                        +---------+---------------+---------+-----------+----------+--------------+ FV Prox  Full                                                        +---------+---------------+---------+-----------+----------+--------------+  FV Mid   Full                                                        +---------+---------------+---------+-----------+----------+--------------+ FV DistalFull                                                        +---------+---------------+---------+-----------+----------+--------------+ PFV      Full                                                         +---------+---------------+---------+-----------+----------+--------------+ POP      Full           Yes      Yes                                 +---------+---------------+---------+-----------+----------+--------------+ PTV      Full                                                        +---------+---------------+---------+-----------+----------+--------------+ PERO     Full                                                        +---------+---------------+---------+-----------+----------+--------------+   +---------+---------------+---------+-----------+----------+--------------+ LEFT     CompressibilityPhasicitySpontaneityPropertiesThrombus Aging +---------+---------------+---------+-----------+----------+--------------+ CFV      Full           Yes      Yes                                 +---------+---------------+---------+-----------+----------+--------------+ SFJ      Full                                                        +---------+---------------+---------+-----------+----------+--------------+ FV Prox  Full                                                        +---------+---------------+---------+-----------+----------+--------------+ FV Mid   Full                                                        +---------+---------------+---------+-----------+----------+--------------+  FV DistalFull                                                        +---------+---------------+---------+-----------+----------+--------------+ PFV      Full                                                        +---------+---------------+---------+-----------+----------+--------------+ POP      Full           Yes      Yes                                 +---------+---------------+---------+-----------+----------+--------------+ PTV      Full                                                         +---------+---------------+---------+-----------+----------+--------------+ PERO     Full                                                        +---------+---------------+---------+-----------+----------+--------------+     Summary: Right: There is no evidence of deep vein thrombosis in the lower extremity. No cystic structure found in the popliteal fossa. Left: There is no evidence of deep vein thrombosis in the lower extremity. No cystic structure found in the popliteal fossa.  *See table(s) above for measurements and observations. Electronically signed by Servando Snare MD on 03/02/2019 at 3:40:30 PM.    Final    [name] Ekg Site Rite  Result Date: 03/03/2019 If Site Rite image not attached, placement could not be confirmed due to current cardiac rhythm.   PHYSICAL EXAM  Temp:  [97.6 F (36.4 C)-98.3 F (36.8 C)] 98.2 F (36.8 C) (09/05 1100) Pulse Rate:  [76-90] 83 (09/05 1532) Resp:  [13-26] 26 (09/05 1532) BP: (118-162)/(55-73) 162/65 (09/05 1532) SpO2:  [83 %-99 %] 99 % (09/05 1532) FiO2 (%):  [60 %] 60 % (09/04 2002)  General - Well nourished, well developed, restless, agitated, in acute distress.  Ophthalmologic - fundi not visualized due to noncooperation.  Cardiovascular - Regular rate and rhythm.  Neuro - awake, alert, agitated, restless, crying for help, complaining of whole body ache.  Not appropriate on exam.  Did not answer orientation question, however, spontaneous appropriate speech, mild dysarthria.  Not following commands.  PERRLA, attending to both sides, inconsistent blinking to visual threat bilaterally.  No significance facial droop.  Moving all extremities symmetrically by observation. Sensation, coordination and gait not tested.   ASSESSMENT/PLAN [name] is a 73 y.o. female with history of non-small cell lung cancer s/p radiation therapy, HIV, Hep B, COPD, HLD, CKD stg 3, and legally blind who presented with fall, generalized weakness, and chest pain,  found to have embolic infarcts in  R MCA territory with subacute hemorrhagic component along with PE and prominent thrombus in aortic arch.  Stroke: Acute R MCA territory infarct including right temporal, insular cortex, BG infarcts, with hemorrhagic conversion at BG, embolic patter concerning for PFO in the setting of especially in setting of PE   CT head Acute right MCA territory ischemic infarct. 10 mm hemorrhage in the right basal ganglia. No midline shift or hydrocephalus.  Recommand MRI with and without contrast once corporative to evaluate for stroke and rule out brain mass or CNS infection in the setting of hx of cancer and HIV  Will also do MRA and once able to tolerate  2D Echo EF > 65%, moderate pulmonary HTN, no PFO seen  Carotid Doppler unremarkable  LE venous Doppler negative for DVT  LDL 48  HgbA1c 6.4  Hypercoagulable work-up so far negative, but some still pending  UDS pending  IV heparin for VTE ppx  ASA 81 prior to admission, now on heparin IV due to PE, aortic arch thrombus and embolism. Will consider switch to DOAC once stable and MRI resulted.  Therapy recommendations:  SNF  Disposition: pending  Embolism  CT chest showed primary emboli and emboli in aortic arch  CT abdomen pelvis showed right renal infarcts as well as splenic infarct.  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 Pt agitation and restless and body aches ? part of the substance withdraw syndrome  Substance cessation education will be provided  Tobacco abuse  Current smoker  Smoking cessation counseling will be provided  Pt is willing to quit  Other Stroke Risk Factors  Advanced age  Obesity, Body mass index is 32.78 kg/m., recommend weight loss, diet and exercise as appropriate    LLL cancer T1b s/p radiation  Other active issues  Severe COPD  hepatitis B  Legally blind  I spent  35 minutes in total face-to-face time with the patient, more than 50% of which was spent in counseling and coordination of care, reviewing test results, images and medication, and discussing the diagnosis of PE, systemic embolism, stroke, substance withdrawal, acute hypercarbic respiratory failure, treatment plan and potential prognosis. This patient's care requiresreview of multiple databases, neurological assessment, discussion with Dr. [name] and medical decision making of high complexity.  Rosalin Hawking, MD PhD Stroke Neurology 03/04/2019 4:10 PM   To contact Stroke Continuity provider, please refer to [email or web address] After hours, contact General Neurology

## 2019-03-05 DIAGNOSIS — Z66 Do not resuscitate: Secondary | ICD-10-CM

## 2019-03-05 DIAGNOSIS — I63411 Cerebral infarction due to embolism of right middle cerebral artery: Secondary | ICD-10-CM

## 2019-03-05 LAB — RAPID URINE DRUG SCREEN, HOSP PERFORMED
Amphetamines: NOT DETECTED
Barbiturates: NOT DETECTED
Benzodiazepines: NOT DETECTED
Cocaine: NOT DETECTED
Opiates: NOT DETECTED
Tetrahydrocannabinol: NOT DETECTED

## 2019-03-05 LAB — CBC
HCT: 47.1 % — ABNORMAL HIGH (ref 36.0–46.0)
Hemoglobin: 13 g/dL (ref 12.0–15.0)
MCH: 23.6 pg — ABNORMAL LOW (ref 26.0–34.0)
MCHC: 27.6 g/dL — ABNORMAL LOW (ref 30.0–36.0)
MCV: 85.6 fL (ref 80.0–100.0)
Platelets: 128 10*3/uL — ABNORMAL LOW (ref 150–400)
RBC: 5.5 MIL/uL — ABNORMAL HIGH (ref 3.87–5.11)
RDW: 22.7 % — ABNORMAL HIGH (ref 11.5–15.5)
WBC: 7.4 10*3/uL (ref 4.0–10.5)
nRBC: 0 % (ref 0.0–0.2)

## 2019-03-05 LAB — PROTEIN C ACTIVITY: Protein C Activity: 76 % (ref 73–180)

## 2019-03-05 LAB — PROTEIN S ACTIVITY: Protein S Activity: 52 % — ABNORMAL LOW (ref 63–140)

## 2019-03-05 LAB — HEPARIN LEVEL (UNFRACTIONATED)
Heparin Unfractionated: 0.44 IU/mL (ref 0.30–0.70)
Heparin Unfractionated: 0.27 IU/mL — ABNORMAL LOW (ref 0.30–0.70)

## 2019-03-05 LAB — PROTEIN S, TOTAL: Protein S Ag, Total: 113 % (ref 60–150)

## 2019-03-05 LAB — HIV-1 RNA QUANT-NO REFLEX-BLD
HIV 1 RNA Quant: 30 copies/mL
LOG10 HIV-1 RNA: 1.477 log10copy/mL

## 2019-03-05 MED ORDER — GABAPENTIN 300 MG PO CAPS
600.0000 mg | ORAL_CAPSULE | Freq: Three times a day (TID) | ORAL | Status: DC
  Administered 2019-03-05 – 2019-03-10 (×16): 600 mg via ORAL
  Filled 2019-03-05 (×16): qty 2

## 2019-03-05 MED ORDER — EFAVIRENZ-EMTRICITAB-TENOFOVIR 600-200-300 MG PO TABS
1.0000 | ORAL_TABLET | Freq: Every day | ORAL | Status: DC
  Administered 2019-03-05 – 2019-03-09 (×5): 1 via ORAL
  Filled 2019-03-05 (×6): qty 1

## 2019-03-05 MED ORDER — OLANZAPINE 2.5 MG PO TABS
2.5000 mg | ORAL_TABLET | Freq: Every day | ORAL | Status: DC
  Administered 2019-03-05 – 2019-03-09 (×5): 2.5 mg via ORAL
  Filled 2019-03-05 (×5): qty 1

## 2019-03-05 MED ORDER — GABAPENTIN 300 MG PO CAPS: 600.0000 mg | ORAL_CAPSULE | Freq: Three times a day (TID) | ORAL | Status: DC

## 2019-03-05 NOTE — Progress Notes (Signed)
ANTICOAGULATION CONSULT NOTE  Pharmacy Consult:  Heparin Indication: pulmonary embolus + aortic thrombus  No Known Allergies  Patient Measurements: Height: 5\' 9"  (175.3 cm) Weight: 222 lb 0.1 oz (100.7 kg) IBW/kg (Calculated) : 66.2 Heparin Dosing Weight: 88 kg  Vital Signs: Temp: 97.8 F (36.6 C) (09/06 0333) Temp Source: Oral (09/06 0333) BP: 160/80 (09/06 0806) Pulse Rate: 83 (09/06 0806)  Labs: Recent Labs    03/03/19 1625  03/04/19 0332 03/04/19 0925 03/04/19 2037 03/05/19 0523 03/05/19 0751  HGB  --   --  13.3  --   --  13.0  --   HCT  --   --  50.0*  --   --  47.1*  --   PLT  --   --  115*  --   --  128*  --   HEPARINUNFRC 0.18*   < >  --  0.19* 0.63  --  0.44  CREATININE 0.86  --  0.85  --   --   --   --    < > = values in this interval not displayed.    Estimated Creatinine Clearance: 76.7 mL/min (by C-G formula based on SCr of 0.85 mg/dL).   Assessment: 73 yr old female presented S/P fall (slid to floor from bed), hip pain. 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 Pharmacy consulted to dose heparin for PE.  Heparin was turned off 03/02/19 PM due to concern for stroke with hemorrhagic conversion. Neurology believes hemorrhagic component is older and have ordered to restart IV heparin.  Early this morning, patient had AMS with hypotension, thought due to narcotic since patient responded to Narcan and IVF.  Heparin was turned off again while awaiting repeat head CT 9/4. CT is stable with unchanged hemorrhage. Dr. [name] was okay to resume IV heparin.   Heparin level this AM is therapeutic (HL 0.44 << 0.63, goal of 0.3-0.5). Hgb/Hct stable, plts up to 128.   Goal of Therapy:  Heparin level 0.3 - 0.5 units/ml Monitor platelets by anticoagulation protocol: Yes   Plan:  - Continue Heparin at 1900 units/hr (19 ml/hr) - Will continue to monitor for any signs/symptoms of bleeding and will follow up with heparin level in 8 hours to  confirm therapeutic   Thank you for allowing pharmacy to be a part of this patient's care.  Alycia Rossetti, PharmD, BCPS Clinical Pharmacist Clinical phone for 03/05/2019: [phone] 03/05/2019 9:53 AM   **Pharmacist phone directory can now be found on [email or web address] ([email or web address]).  Listed under [place].

## 2019-03-05 NOTE — Progress Notes (Signed)
CSW faxed clinicals for [facility].   CSW will continue to follow and assist with disposition.   Domenic Schwab, MSW, [place] [facility]  [phone]

## 2019-03-05 NOTE — Progress Notes (Addendum)
Chart reviewed.  Received call from [place] requesting an exception to the visitor policy which I politely refused.  She has taken all PRN and scheduled pain medications.  Examined patient.  She is in wrist restraints and a sitter is present.  She does not speak with me.  Fortunately her sister called while I was in the room.  I asked Deloris to help me by asking [name] about her comfort level.  [name] responded to [employer].  She greeted her.  Responded slowly that she did eat breakfast.  Responded that she had pain her side.  Otherwise she denied pain elsewhere.   I asked the sitter to help Heloise with the phone so she could continue to speak with her sister.  PE:  Awake, alert, with delayed responses that are short phrases.  CV rrr, resp no distress.  Assessment:  We are attempting to strike a balance with her medications that will increase her comfort and improve behaviors without causing hypercapnia.  Prognosis:  Unable to determine but she is unfortunately at high risk for acute decline due to further clots/stroke.  Recommendation:    For agitation and sleep will initiate low dose zyprexa.  Hopefully this will begin to eliminate the need for restraints and help Miakoda feel better.  While she is still complaining of pain - her pain level is improved and I'd recommend not changing the robaxin, gabapentin and oxy ir at this point.  Palliative to follow outpatient at SNF.  Family is hopeful for [facility] as her sister lives there.  Florentina Jenny, PA-C Palliative Medicine Pager: [phone]  15 min.

## 2019-03-05 NOTE — NC FL2 (Signed)
[place] LEVEL OF CARE SCREENING TOOL     IDENTIFICATION  Patient Name: [name] Birthdate: 1946-01-09 Sex: female Admission Date (Current Location): 03/01/2019  [place] and [place] Number:  [name] [id number] [place] and Address:  [facility] [place]. [facility], [place] [address], [place], [place] [address]      Provider Number: [id number]  Attending Physician Name and Address:  Debbe Odea, MD  Relative Name and Phone Number:  Nazariah, Cadet, [phone]    Current Level of Care: Hospital Recommended Level of Care: [place] Prior Approval Number:    Date Approved/Denied:   PASRR Number: Under Manual Review  Discharge Plan: SNF    Current Diagnoses: Patient Active Problem List   Diagnosis Date Noted  . DNR (do not resuscitate)   . Altered mental status   . Acute pulmonary embolism without acute cor pulmonale (HCC)   . Acute ischemic right MCA stroke ([place])   . Palliative care encounter   . Aortic embolism or thrombosis ([place])   . CVA (cerebral vascular accident) ([place]) 03/02/2019  . COPD (chronic obstructive pulmonary disease) ([place]) 03/01/2019  . PE (pulmonary thromboembolism) ([place]) 03/01/2019  . Tobacco abuse 03/01/2019  . 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[place]) 03/01/2019  . CKD (chronic kidney disease), stage III ([place]) 03/01/2019  . Generalized weakness 03/01/2019  . Fall at home   . 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([place])   . Splenic infarct   . Acute and chronic respiratory failure (acute-on-chronic) ([place]) 02/11/2019  . Sepsis ([place]) 03/28/2015  . CAP (community acquired pneumonia) 03/28/2015  . UTI (lower urinary tract infection) 03/28/2015  . Respiratory failure ([place]) 03/28/2015  . Non-small cell lung cancer ([place])   . Malignant neoplasm of upper lobe, bronchus or lung 01/03/2014  . Acute hypoxemic respiratory failure ([place]) 10/31/2013  . COPD with acute exacerbation ([place]) 10/30/2013  . COPD exacerbation ([place]) 10/30/2013  . Other and  unspecified noninfectious gastroenteritis and colitis(558.9) 11/20/2012  . Rectal bleed 11/19/2012  . Obesity 05/05/2012  . Renal insufficiency 05/05/2012  . Hyperglycemia 05/05/2012  . Asthma 12/10/2011  . Cigarette smoker 12/10/2011  . Hyperlipidemia 12/11/2010  . Hypertension 12/11/2010  . ALLERGIC RHINITIS, SEASONAL 07/03/2010  . COPD 07/03/2010  . Human immunodeficiency virus (HIV) disease ([place]) 12/09/2006  . DEPRESSION 12/09/2006  . BREAST MASS, BENIGN 12/09/2006  . OSTEOPOROSIS 12/09/2006    Orientation RESPIRATION BLADDER Height & Weight     Self  O2, Other (Comment)(100, [place], 3L; Pt is also on Bipap. Medium facemask, 10 is set rate, 19 is resp rate, 50% 02 percent) Incontinent, External catheter Weight: 222 lb 0.1 oz (100.7 kg) Height:  5\' 9"  (175.3 cm)  BEHAVIORAL SYMPTOMS/MOOD NEUROLOGICAL BOWEL NUTRITION STATUS      Incontinent Diet(Dys 2 diet, thin liquids)  AMBULATORY STATUS COMMUNICATION OF NEEDS Skin   Extensive Assist Verbally Normal                       Personal Care Assistance Level of Assistance  Bathing, Feeding, Dressing, Total care Bathing Assistance: Maximum assistance Feeding assistance: Limited assistance Dressing Assistance: Maximum assistance Total Care Assistance: Maximum assistance   Functional Limitations Info  Sight, Hearing, Speech Sight Info: Adequate Hearing Info: Adequate Speech Info: Adequate    SPECIAL CARE FACTORS FREQUENCY  PT (By licensed PT), OT (By licensed OT), Speech therapy     PT Frequency: 5x/wk OT Frequency: 5x/wk     Speech Therapy Frequency: 2x/wk      Contractures Contractures Info:  Not present    Additional Factors Info  Code Status, Allergies, Psychotropic Code Status Info: DNR Allergies Info: No Known Allergies Psychotropic Info: Cymbalta DR 20 mg daily         Current Medications (03/05/2019):  This is the current hospital active medication list Current Facility-Administered Medications   Medication Dose Route Frequency Provider Last Rate Last Dose  .  stroke: mapping our early stages of recovery book   Does not apply Once Debbe Odea, MD   Stopped at 03/02/19 1010  . acetaminophen (TYLENOL) tablet 650 mg  650 mg Oral Q6H PRN Ivor Costa, MD   650 mg at 03/05/19 5852   Or  . acetaminophen (TYLENOL) suppository 650 mg  650 mg Rectal Q6H PRN Ivor Costa, MD      . acetaminophen (TYLENOL) tablet 650 mg  650 mg Oral Q8H Dellinger, Marianne L, PA-C   650 mg at 03/04/19 1643  . albuterol (PROVENTIL) (2.5 MG/3ML) 0.083% nebulizer solution 3 mL  3 mL Inhalation Q6H PRN Ivor Costa, MD   3 mL at 03/03/19 0907  . calcium-vitamin D (OSCAL WITH D) 500-200 MG-UNIT per tablet 1 tablet  1 tablet Oral Q breakfast Debbe Odea, MD   1 tablet at 03/05/19 [phone]  . DULoxetine (CYMBALTA) DR capsule 20 mg  20 mg Oral Daily Dellinger, Marianne L, PA-C   20 mg at 03/04/19 1858  . efavirenz-emtricitabine-tenofovir (ATRIPLA) 600-200-300 MG per tablet 1 tablet  1 tablet Oral QHS Rizwan, Saima, MD      . gabapentin (NEURONTIN) capsule 600 mg  600 mg Oral TID Debbe Odea, MD   600 mg at 03/05/19 0811  . haloperidol lactate (HALDOL) injection 1-4 mg  1-4 mg Intravenous Q3H PRN Rigoberto Noel, MD   1 mg at 03/04/19 1537  . heparin ADULT infusion 100 units/mL (25000 units/236mL sodium chloride 0.45%)  1,900 Units/hr Intravenous Continuous Einar Grad, RPH 19 mL/hr at 03/05/19 0810 1,900 Units/hr at 03/05/19 0810  . methocarbamol (ROBAXIN) tablet 500 mg  500 mg Oral Q12H PRN Dellinger, Marianne L, PA-C   500 mg at 03/05/19 0810  . mometasone-formoterol (DULERA) 200-5 MCG/ACT inhaler 2 puff  2 puff Inhalation BID Ivor Costa, MD      . naloxone [facility]) injection 0.4 mg  0.4 mg Intravenous PRN Schorr, Rhetta Mura, NP   0.4 mg at 03/03/19 0515  . nicotine (NICODERM CQ - dosed in mg/24 hours) patch 21 mg  21 mg Transdermal Daily Ivor Costa, MD   21 mg at 03/04/19 1114  . OLANZapine (ZYPREXA) tablet 2.5 mg  2.5  mg Oral QHS Dellinger, Marianne L, PA-C      . omega-3 acid ethyl esters (LOVAZA) capsule 1 g  1 g Oral Daily Ivor Costa, MD   1 g at 03/02/19 1008  . ondansetron (ZOFRAN) tablet 4 mg  4 mg Oral Q6H PRN Ivor Costa, MD       Or  . ondansetron [facility]) injection 4 mg  4 mg Intravenous Q6H PRN Ivor Costa, MD   4 mg at 03/02/19 0744  . oxyCODONE (Oxy IR/ROXICODONE) immediate release tablet 5 mg  5 mg Oral BID PRN Dellinger, Marianne L, PA-C   5 mg at 03/05/19 0017  . sodium chloride flush (NS) 0.9 % injection 10-40 mL  10-40 mL Intracatheter Q12H Debbe Odea, MD   10 mL at 03/04/19 2344  . sodium chloride flush (NS) 0.9 % injection 10-40 mL  10-40 mL Intracatheter PRN Debbe Odea, MD      .  umeclidinium bromide (INCRUSE ELLIPTA) 62.5 MCG/INH 2 puff  2 puff Inhalation Daily Ivor Costa, MD   Stopped at 03/02/19 1025     Discharge Medications: Please see discharge summary for a list of discharge medications.  Relevant Imaging Results:  Relevant Lab Results:   Additional Information SSN: [id number]  Philippa Chester Claris Guymon, LCSWA

## 2019-03-05 NOTE — Progress Notes (Signed)
PROGRESS NOTE    [name]   NTI:[id number]  DOB: 1946-04-02  DOA: 03/01/2019 PCP: System, Provider Not In   Brief Narrative:  [name] is a 72 y.o. 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 Patient had noted generalized weakness and leg pain for a number of days and then fell out of the bed around 6 AM and laid on the floor until her son found her about 6 hours later.  She was too weak to ambulate.  She noted right-sided lower rib pain after the fall.  The patient underwent a CT of the chest abdomen pelvis in the ED.  Results showed pulmonary emboli and emboli in the aortic arch with a right renal infarcts and a splenic infarct.  Also noted is a fatty liver and emphysema. She was started on a heparin infusion.  She subsequently had a CT scan of her head which was ordered by the admitting doctor and showed acute right MCA territory ischemic infarct. 10 mm hemorrhage in the right basal ganglia  She was recently admitted from 8/15-8/21 for acute respiratory failure due to COPD and overmedications and her Robaxin, Gabapentin and Seroquel. She required a BiPAP. She was discharged home on 4 L O2 and a CPAP.  Subjective: She is talking to he sister and is quite calm and without complaints. Specifically no pain.     Assessment & Plan:   Principal Problem: Acute hypercapnic respiratory failure - may be due to narcotics, progression of CVA in setting of PEs or COPD flare - needed > 12 hrs of BiPAP but improved -   added low dose solumedrol  - will begin to wean steroids - I have weaned her off of O2 this AM- pulse ox is 96%  CVA (cerebral vascular accident)  - left facial droop, mild weakness on left side with subjective decreased sensation - CVA may be embolic due to  aortic thrombus? Neuro recommended Heparin infusion - Her 2D echo does not show a PFO - will need SNF based on PT/OT evals - passed swallow eval - Hypercoagulable work up ordered - Cardiolipin neg, Pro C normal, B2 glycoprotein neg, Homocysteine normal, Antithrombin III normal- awaiting the rest    PE (pulmonary thromboembolism) -acute respiratory failure - on 9/3, in ED,  patient was quite hypoxic and requiring 4 L of oxygen - Bilateral lower extremity venous duplex is negative for DVT - cont Heparin infusion    Aortic thrombus  - cont Heparin infusion   DM 2 - Hb A1c 6.4    Human immunodeficiency virus (HIV) disease  -Last CD4 count was 406 on 02/13/2019 -Continue Atripla  Neuropathy in legs - was non stop complaining of pain in legs after admitted - now holding gabapentin and all pain medications due to her episode of hypercapnia and also as she is NPO - NOTE: she was not in pain when I saw her today and interestingly PT note does not mention pain     Cigarette smoker - COPD (chronic obstructive pulmonary disease)   - advised to stop smoking     Non-small cell lung cancer   - s/p radiation    CKD (chronic kidney disease), stage II- III    - stable  + UA/ urine culture > 100K Aerococcus - she has not complained of dysuria- follow off of  antibitoics  Time spent in minutes: 35 DVT prophylaxis: SCDs Code Status: DNR per family Family Communication: discussed plan in detail with brother Jinan Biggins Disposition Plan: cont to follow in hospital- appreciate palliative care consult- family wants SNF Consultants:   Neuro Procedures:   2D echo 1. The left ventricle has hyperdynamic systolic function, with an ejection fraction of >65%. The cavity size was normal. There is mildly increased left ventricular wall thickness. Left ventricular diastolic Doppler parameters are consistent with impaired relaxation. 2. The right ventricle has normal systolic function. The  cavity was normal. 3. The mitral valve is abnormal. Mild thickening of the mitral valve leaflet. 4. The tricuspid valve is grossly normal. 5. The aortic valve is tricuspid. Mild calcification of the aortic valve. No stenosis of the aortic valve. 6. The aorta is normal unless otherwise noted. 7. Vigorous LV systolic function; mild LVH; grade 1 diastolic dysfunction; trace TR; moderate pulmonary hypertension Antimicrobials:  Anti-infectives (From admission, onward)   Start     Dose/Rate Route Frequency Ordered Stop   03/05/19 2200  efavirenz-emtricitabine-tenofovir (ATRIPLA) 600-200-300 MG per tablet 1 tablet     1 tablet Oral Daily at bedtime 03/05/19 1003     03/01/19 2215  efavirenz-emtricitabine-tenofovir (ATRIPLA) 600-200-300 MG per tablet 1 tablet  Status:  Discontinued     1 tablet Oral Daily at bedtime 03/01/19 2206 03/03/19 1641       Objective: Vitals:   03/04/19 2338 03/05/19 0333 03/05/19 0723 03/05/19 0806  BP: (!) 165/89 (!) 167/84 (!) 158/84 (!) 160/80  Pulse: 88 88  83  Resp: (!) 23 19 17 17   Temp: (!) 97.5 F (36.4 C) 97.8 F (36.6 C)    TempSrc: Oral Oral    SpO2: 96% 97%  100%  Weight:      Height:        Intake/Output Summary (Last 24 hours) at 03/05/2019 1204 Last data filed at 03/05/2019 1100 Gross per 24 hour  Intake 1423.73 ml  Output 2400 ml  Net -976.27 ml   Filed Weights   03/01/19 2000  Weight: 100.7 kg    Examination: General exam: Appears comfortable  HEENT: PERRLA, oral mucosa moist, no sclera icterus or thrush Respiratory system: Clear to auscultation. Respiratory effort normal. Cardiovascular system: S1 & S2 heard,  No murmurs  Gastrointestinal system: Abdomen soft, non-tender, nondistended. Normal bowel sounds   Central nervous system: Alert and oriented. No focal neurological deficits. Extremities: No cyanosis, clubbing or edema Skin: No rashes or ulcers Psychiatry:  Mood & affect appropriate.   Data Reviewed: I have personally  reviewed following labs and imaging studies  CBC: Recent Labs  Lab 03/01/19 1330 03/02/19 0209 03/02/19 0525 03/04/19 0332 03/05/19 0523  WBC 11.6* 10.3 9.6 6.9 7.4  NEUTROABS 8.0*  --   --   --   --   HGB 15.1* 14.2 14.4 13.3 13.0  HCT 55.0* 53.9* 53.7* 50.0* 47.1*  MCV 86.2 88.8 88.5 89.1 85.6  PLT 149* 142* 137* 115* 696*   Basic Metabolic Panel: Recent Labs  Lab 03/01/19 1330 03/02/19 0525 03/03/19 1625 03/04/19 0332  NA 140 137 142 140  K 5.0 3.5 3.9 4.2  CL 102 97* 105 104  CO2 23 26 29 25   GLUCOSE 89 80 89 96  BUN 13 10 8 8   CREATININE 1.31* 1.04* 0.86 0.85  CALCIUM 8.6* 7.9* 8.2* 8.2*   GFR: Estimated Creatinine Clearance: 76.7 mL/min (by C-G formula based on SCr of 0.85 mg/dL). Liver Function Tests:  Recent Labs  Lab 03/01/19 1330  AST 73*  ALT 25  ALKPHOS 66  BILITOT 2.0*  PROT 7.6  ALBUMIN 3.2*   No results for input(s): LIPASE, AMYLASE in the last 168 hours. No results for input(s): AMMONIA in the last 168 hours. Coagulation Profile: No results for input(s): INR, PROTIME in the last 168 hours. Cardiac Enzymes: Recent Labs  Lab 03/01/19 1330  CKTOTAL 382*   BNP (last 3 results) No results for input(s): PROBNP in the last 8760 hours. HbA1C: Recent Labs    03/03/19 0500  HGBA1C 6.4*   CBG: Recent Labs  Lab 03/02/19 0644 03/03/19 0358 03/03/19 0546  GLUCAP 102* 104* 98   Lipid Profile: Recent Labs    03/03/19 0500  CHOL 77  HDL 15*  LDLCALC 48  TRIG 70  CHOLHDL 5.1   Thyroid Function Tests: No results for input(s): TSH, T4TOTAL, FREET4, T3FREE, THYROIDAB in the last 72 hours. Anemia Panel: No results for input(s): VITAMINB12, FOLATE, FERRITIN, TIBC, IRON, RETICCTPCT in the last 72 hours. Urine analysis:    Component Value Date/Time   COLORURINE AMBER (A) 03/01/2019 1721   APPEARANCEUR CLOUDY (A) 03/01/2019 1721   LABSPEC 1.023 03/01/2019 1721   PHURINE 5.0 03/01/2019 1721   GLUCOSEU NEGATIVE 03/01/2019 1721   HGBUR  NEGATIVE 03/01/2019 1721   BILIRUBINUR NEGATIVE 03/01/2019 1721   KETONESUR 80 (A) 03/01/2019 1721   PROTEINUR 100 (A) 03/01/2019 1721   UROBILINOGEN 0.2 03/28/2015 1227   NITRITE NEGATIVE 03/01/2019 1721   LEUKOCYTESUR NEGATIVE 03/01/2019 1721   Sepsis Labs: @LABRCNTIP (procalcitonin:4,lacticidven:4) ) Recent Results (from the past 240 hour(s))  Urine culture     Status: Abnormal   Collection Time: 03/01/19  5:10 PM   Specimen: Urine, Random  Result Value Ref Range Status   Specimen Description URINE, RANDOM  Final   Special Requests NONE  Final   Culture (A)  Final    >=100,000 COLONIES/mL AEROCOCCUS SPECIES Standardized susceptibility testing for this organism is not available. Performed at [facility], [place] [address], [place], [place] [address]    Report Status 03/03/2019 FINAL  Final  SARS CORONAVIRUS 2 (TAT 6-24 HRS) Nasopharyngeal Nasopharyngeal Swab     Status: None   Collection Time: 03/01/19 10:10 PM   Specimen: Nasopharyngeal Swab  Result Value Ref Range Status   SARS Coronavirus 2 NEGATIVE NEGATIVE Final    Comment: (NOTE) SARS-CoV-2 target nucleic acids are NOT DETECTED. The SARS-CoV-2 RNA is generally detectable in upper and lower respiratory specimens during the acute phase of infection. Negative results do not preclude SARS-CoV-2 infection, do not rule out co-infections with other pathogens, and should not be used as the sole basis for treatment or other patient management decisions. Negative results must be combined with clinical observations, patient history, and epidemiological information. The expected result is Negative. Fact Sheet for Patients: SugarRoll.be Fact Sheet for Healthcare Providers: https://www.woods-mathews.com/ This test is not yet approved or cleared by the [place] FDA and  has been authorized for detection and/or diagnosis of SARS-CoV-2 by FDA under an Emergency Use Authorization  (EUA). This EUA will remain  in effect (meaning this test can be used) for the duration of the COVID-19 declaration under Section 56 4(b)(1) of the Act, 21 U.S.C. section 360bbb-3(b)(1), unless the authorization is terminated or revoked sooner. Performed at [facility], [place] [address], [place], [place] [address]   Culture, blood (Routine X 2) w Reflex to ID Panel     Status: None (Preliminary result)  Collection Time: 03/02/19  2:09 AM   Specimen: BLOOD RIGHT FOREARM  Result Value Ref Range Status   Specimen Description BLOOD RIGHT FOREARM  Final   Special Requests   Final    BOTTLES DRAWN AEROBIC AND ANAEROBIC Blood Culture results may not be optimal due to an inadequate volume of blood received in culture bottles   Culture   Final    NO GROWTH 3 DAYS Performed at [facility], [place] [address], [place], [place] [address]    Report Status PENDING  Incomplete  Culture, blood (Routine X 2) w Reflex to ID Panel     Status: None (Preliminary result)   Collection Time: 03/02/19  5:25 AM   Specimen: BLOOD  Result Value Ref Range Status   Specimen Description BLOOD RIGHT ARM  Final   Special Requests   Final    BOTTLES DRAWN AEROBIC AND ANAEROBIC Blood Culture adequate volume   Culture   Final    NO GROWTH 3 DAYS Performed at [facility], [place] [address] [place], [place] [address]    Report Status PENDING  Incomplete         Radiology Studies: No results found.    Scheduled Meds: .  stroke: mapping our early stages of recovery book   Does not apply Once  . acetaminophen  650 mg Oral Q8H  . calcium-vitamin D  1 tablet Oral Q breakfast  . DULoxetine  20 mg Oral Daily  . efavirenz-emtricitabine-tenofovir  1 tablet Oral QHS  . gabapentin  600 mg Oral TID  . mometasone-formoterol  2 puff Inhalation BID  . nicotine  21 mg Transdermal Daily  . OLANZapine  2.5 mg Oral QHS  . omega-3 acid ethyl esters  1 g Oral Daily  . sodium chloride flush  10-40 mL  Intracatheter Q12H  . umeclidinium bromide  2 puff Inhalation Daily   Continuous Infusions: . heparin 1,900 Units/hr (03/05/19 0810)     LOS: 4 days      Debbe Odea, MD [facility] Pager: [email or web address] Password [email or web address] 03/05/2019, 12:04 PM

## 2019-03-05 NOTE — Progress Notes (Signed)
ANTICOAGULATION CONSULT NOTE  Pharmacy Consult:  Heparin Indication: pulmonary embolus + aortic thrombus  No Known Allergies  Patient Measurements: Height: 5\' 9"  (175.3 cm) Weight: 222 lb 0.1 oz (100.7 kg) IBW/kg (Calculated) : 66.2 Heparin Dosing Weight: 88 kg  Vital Signs: Temp: 98.1 F (36.7 C) (09/06 1534) Temp Source: Oral (09/06 1534) BP: 163/97 (09/06 1534) Pulse Rate: 83 (09/06 0806)  Labs: Recent Labs    03/03/19 1625  03/04/19 0332  03/04/19 2037 03/05/19 0523 03/05/19 0751 03/05/19 1630  HGB  --   --  13.3  --   --  13.0  --   --   HCT  --   --  50.0*  --   --  47.1*  --   --   PLT  --   --  115*  --   --  128*  --   --   HEPARINUNFRC 0.18*   < >  --    < > 0.63  --  0.44 0.27*  CREATININE 0.86  --  0.85  --   --   --   --   --    < > = values in this interval not displayed.    Estimated Creatinine Clearance: 76.7 mL/min (by C-G formula based on SCr of 0.85 mg/dL).   Assessment: 73 yr old female presented S/P fall (slid to floor from bed), hip pain. 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 Pharmacy consulted to dose heparin for PE.  Heparin was turned off 03/02/19 PM due to concern for stroke with hemorrhagic conversion. Neurology believes hemorrhagic component is older and have ordered to restart IV heparin.  Early this morning, patient had AMS with hypotension, thought due to narcotic since patient responded to Narcan and IVF.  Heparin was turned off again while awaiting repeat head CT 9/4. CT is stable with unchanged hemorrhage. Dr. [name] was okay to resume IV heparin.  Heparin level now slightly below goal at 0.27, no infusion issues noted.  Goal of Therapy:  Heparin level 0.3 - 0.5 units/ml Monitor platelets by anticoagulation protocol: Yes   Plan:  -Increase heparin slightly to 1950 units/h -Recheck heparin level with daily labs   Arrie Senate, PharmD, BCPS Clinical Pharmacist [phone] Please check AMION for  all [facility] numbers 03/05/2019

## 2019-03-05 NOTE — Progress Notes (Addendum)
STROKE TEAM PROGRESS NOTE   INTERVAL HISTORY Patient lying in bed, sitter at bedside. Calmer than yesterday but still has intermittent crying for pain. Will do CTA head and neck today.   Vitals:   03/04/19 2338 03/05/19 0333 03/05/19 0723 03/05/19 0806  BP: (!) 165/89 (!) 167/84 (!) 158/84 (!) 160/80  Pulse: 88 88  83  Resp: (!) 23 19 17 17   Temp: (!) 97.5 F (36.4 C) 97.8 F (36.6 C)    TempSrc: Oral Oral    SpO2: 96% 97%  100%  Weight:      Height:        CBC:  Recent Labs  Lab 03/01/19 1330  03/04/19 0332 03/05/19 0523  WBC 11.6*   < > 6.9 7.4  NEUTROABS 8.0*  --   --   --   HGB 15.1*   < > 13.3 13.0  HCT 55.0*   < > 50.0* 47.1*  MCV 86.2   < > 89.1 85.6  PLT 149*   < > 115* 128*   < > = values in this interval not displayed.    Basic Metabolic Panel:  Recent Labs  Lab 03/03/19 1625 03/04/19 0332  NA 142 140  K 3.9 4.2  CL 105 104  CO2 29 25  GLUCOSE 89 96  BUN 8 8  CREATININE 0.86 0.85  CALCIUM 8.2* 8.2*   Lipid Panel:     Component Value Date/Time   CHOL 77 03/03/2019 0500   TRIG 70 03/03/2019 0500   HDL 15 (L) 03/03/2019 0500   CHOLHDL 5.1 03/03/2019 0500   VLDL 14 03/03/2019 0500   LDLCALC 48 03/03/2019 0500   HgbA1c:  Lab Results  Component Value Date   HGBA1C 6.4 (H) 03/03/2019   Urine Drug Screen: No results found for: LABOPIA, COCAINSCRNUR, LABBENZ, AMPHETMU, THCU, LABBARB  Alcohol Level No results found for: [facility]  IMAGING Dg Chest 2 View  Result Date: 03/01/2019 CLINICAL DATA:  Anterior chest pain post fall. EXAM: CHEST - 2 VIEW COMPARISON:  February 14, 2019 FINDINGS: Cardiomediastinal silhouette is normal. Mediastinal contours appear intact. Tortuosity of the aorta. There is no evidence of pleural effusion or pneumothorax. Linear opacities in the left mid thorax, possibly posttreatment or postsurgical changes. No radiographic evidence of displaced rib fractures. Soft tissues are grossly normal. IMPRESSION: 1. Linear opacities in the left  mid thorax, possibly posttreatment or postsurgical changes. 2. No radiographic evidence of displaced rib fractures. Electronically Signed   By: Fidela Salisbury M.D.   On: 03/01/2019 14:48   Ct Head Wo Contrast  Result Date: 03/03/2019 CLINICAL DATA:  Focal neuro deficit. Unresponsive. 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 EXAM: CT HEAD WITHOUT CONTRAST TECHNIQUE: Contiguous axial images were obtained from the base of the skull through the vertex without intravenous contrast. COMPARISON:  Head CT 03/02/2019 FINDINGS: Brain: A 10 mm hemorrhage in the right lentiform nucleus with mild surrounding edema is unchanged. Additional patchy areas of hypoattenuation involving cortex and white matter in the right temporal lobe, inferior right parietal lobe, and posteromedial right frontal lobe appears slightly more well-defined than on the prior study. No new intracranial hemorrhage, definite new infarct, midline shift, or extra-axial fluid collection is identified. The ventricles are normal in size. Background chronic small vessel ischemic changes are present in the cerebral white matter bilaterally. Vascular: Calcified atherosclerosis at the skull base. No hyperdense vessel. Skull: No acute fracture or focal osseous lesion. Sinuses/Orbits: Old left orbital floor fracture. Mild right periorbital soft tissue  swelling. Mild mucosal thickening in the paranasal sinuses. Clear mastoid air cells. Other: None. IMPRESSION: Evolving acute right cerebral infarcts primarily in the MCA territory. Unchanged 10 mm right basal ganglia hemorrhage. Electronically Signed   By: Logan Bores M.D.   On: 03/03/2019 05:21   Ct Head Wo Contrast  Result Date: 03/02/2019 CLINICAL DATA:  [place] out of bed. EXAM: CT HEAD WITHOUT CONTRAST TECHNIQUE: Contiguous axial images were obtained from the base of the skull through the vertex without intravenous contrast. COMPARISON:  MR brain dated Nov 22, 2013. FINDINGS: Brain: There is hypodensity  and loss of the gray-white matter differentiation predominantly involving the right temporal lobe, consistent with acute infarct. 8 x 8 x 10 mm area of hemorrhage in the right basal ganglia with surrounding edema. No midline shift, hydrocephalus, or extra-axial collection. Vascular: Atherosclerotic vascular calcification of the carotid siphons. No hyperdense vessel. Skull: Normal. Negative for fracture or focal lesion. Sinuses/Orbits: No acute finding. Mild ethmoid air cell mucosal thickening. Other: None. IMPRESSION: 1. Acute right MCA territory ischemic infarct. 10 mm hemorrhage in the right basal ganglia. No midline shift or hydrocephalus. Critical Value/emergent results were called by telephone at the time of interpretation on 03/02/2019 at 7:55 am to Dr. Debbe Odea, who verbally acknowledged these results. Electronically Signed   By: Titus Dubin M.D.   On: 03/02/2019 07:59   Ct Chest W Contrast  Result Date: 03/01/2019 CLINICAL DATA:  Patient status post fall out of bed at 6 a.m. this morning. Bilateral lower extremity weakness. EXAM: CT CHEST, ABDOMEN, AND PELVIS WITH CONTRAST TECHNIQUE: Multidetector CT imaging of the chest, abdomen and pelvis was performed following the standard protocol during bolus administration of intravenous contrast. CONTRAST:  140mL OMNIPAQUE IOHEXOL 300 MG/ML  SOLN COMPARISON:  CT chest 11/03/2013.  PET CT scan 11/22/2013. FINDINGS: CT CHEST FINDINGS Cardiovascular: Pulmonary embolus are seen in right pulmonary arteries such as on image 14 of series 6 and 64-67 of series 6. Emboli are also seen in the aortic arch such as on images 21 and 22 of series 3 and 69 to 76 of series 7. Heart size is normal. No pleural or pericardial effusion. Calcific aortic and coronary atherosclerosis. Mediastinum/Nodes: No enlarged mediastinal, hilar, or axillary lymph nodes. Thyroid gland, trachea, and esophagus demonstrate no significant findings. Lungs/Pleura: Lungs are emphysematous. There is  scar in the left upper lobe at the site of patient's prior lung cancer. Mild dependent atelectasis is noted. Musculoskeletal: No acute or focal bony abnormality. CT ABDOMEN PELVIS FINDINGS Hepatobiliary: No focal liver abnormality is seen. No gallstones, gallbladder wall thickening, or biliary dilatation. The liver is low attenuating consistent with fatty infiltration. Pancreas: 0.4 cm hypoattenuating lesion at the junction of the tail and body on image 65 is likely due to some prior inflammatory process. No peripancreatic inflammatory change. No pancreatic duct dilatation. Spleen: Wedge-shaped hypoattenuation in the superior aspect of the spleen measuring 2.7 cm AP x 2.7 cm transverse by 2.0 cm craniocaudal is consistent with infarct. Spleen is otherwise unremarkable. Adrenals/Urinary Tract: The adrenal glands appear normal. Wedge-shaped area peripheral hypoattenuation in the mid to upper pole of the right kidney is compatible with an infarct. More subtle area of decreased cortical attenuation in the more superior aspect of the right kidney is also worrisome for infarct. Left kidney appears normal. Ureters and urinary bladder are unremarkable. Stomach/Bowel: Stomach is within normal limits. Appendix appears normal. No evidence of bowel wall thickening, distention, or inflammatory changes. Vascular/Lymphatic: Aortic atherosclerosis. No enlarged abdominal or  pelvic lymph nodes. Reproductive: Uterus and bilateral adnexa are unremarkable. Other: None. Musculoskeletal: No acute or focal abnormality. Degenerative disc disease L5-S1 noted. 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 Findings raise the possibility for ventricular or atrial septal defect. No evidence of trauma. Fatty infiltration of the liver. Atherosclerosis. Emphysema. These results were called by telephone at the time of interpretation on 03/01/2019 at 7:29 pm to [facility], who  verbally acknowledged these results. Electronically Signed   By: Inge Rise M.D.   On: 03/01/2019 19:36   Ct Abdomen Pelvis W Contrast  Result Date: 03/01/2019 CLINICAL DATA:  Patient status post fall out of bed at 6 a.m. this morning. Bilateral lower extremity weakness. EXAM: CT CHEST, ABDOMEN, AND PELVIS WITH CONTRAST TECHNIQUE: Multidetector CT imaging of the chest, abdomen and pelvis was performed following the standard protocol during bolus administration of intravenous contrast. CONTRAST:  149mL OMNIPAQUE IOHEXOL 300 MG/ML  SOLN COMPARISON:  CT chest 11/03/2013.  PET CT scan 11/22/2013. FINDINGS: CT CHEST FINDINGS Cardiovascular: Pulmonary embolus are seen in right pulmonary arteries such as on image 17 of series 6 and 64-67 of series 6. Emboli are also seen in the aortic arch such as on images 21 and 22 of series 3 and 69 to 76 of series 7. Heart size is normal. No pleural or pericardial effusion. Calcific aortic and coronary atherosclerosis. Mediastinum/Nodes: No enlarged mediastinal, hilar, or axillary lymph nodes. Thyroid gland, trachea, and esophagus demonstrate no significant findings. Lungs/Pleura: Lungs are emphysematous. There is scar in the left upper lobe at the site of patient's prior lung cancer. Mild dependent atelectasis is noted. Musculoskeletal: No acute or focal bony abnormality. CT ABDOMEN PELVIS FINDINGS Hepatobiliary: No focal liver abnormality is seen. No gallstones, gallbladder wall thickening, or biliary dilatation. The liver is low attenuating consistent with fatty infiltration. Pancreas: 0.4 cm hypoattenuating lesion at the junction of the tail and body on image 65 is likely due to some prior inflammatory process. No peripancreatic inflammatory change. No pancreatic duct dilatation. Spleen: Wedge-shaped hypoattenuation in the superior aspect of the spleen measuring 2.7 cm AP x 2.7 cm transverse by 2.0 cm craniocaudal is consistent with infarct. Spleen is otherwise  unremarkable. Adrenals/Urinary Tract: The adrenal glands appear normal. Wedge-shaped area peripheral hypoattenuation in the mid to upper pole of the right kidney is compatible with an infarct. More subtle area of decreased cortical attenuation in the more superior aspect of the right kidney is also worrisome for infarct. Left kidney appears normal. Ureters and urinary bladder are unremarkable. Stomach/Bowel: Stomach is within normal limits. Appendix appears normal. No evidence of bowel wall thickening, distention, or inflammatory changes. Vascular/Lymphatic: Aortic atherosclerosis. No enlarged abdominal or pelvic lymph nodes. Reproductive: Uterus and bilateral adnexa are unremarkable. Other: None. Musculoskeletal: No acute or focal abnormality. Degenerative disc disease L5-S1 noted. 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 Findings raise the possibility for ventricular or atrial septal defect. No evidence of trauma. Fatty infiltration of the liver. Atherosclerosis. Emphysema. These results were called by telephone at the time of interpretation on 03/01/2019 at 7:29 pm to [facility], who verbally acknowledged these results. Electronically Signed   By: Inge Rise M.D.   On: 03/01/2019 19:36   Dg Chest Port 1 View  Result Date: 03/03/2019 CLINICAL DATA:  Respiratory failure EXAM: PORTABLE CHEST 1 VIEW COMPARISON:  03/01/2019 FINDINGS: Cardiac shadow is mildly enlarged but  stable. Aortic calcifications are again seen. The lungs are well aerated bilaterally. Increased density in the lateral aspect of the right lung base is noted likely related to the patient's known pulmonary emboli. Irregularity of the fifth and sixth ribs on the left laterally is seen. These were not present on prior chest x-rays from 2016 and likely related to the patient's known history of prior lung carcinoma with mild healing. No acute fracture is seen. IMPRESSION:  Slight increase in parenchymal opacity in the right lung base laterally likely related to the known underlying pulmonary emboli. Old deformities of the fifth and sixth ribs on the left likely related to the patient's known history of prior lung carcinoma. Electronically Signed   By: Inez Catalina M.D.   On: 03/03/2019 06:10   Dg Chest Port 1 View  Result Date: 02/14/2019 CLINICAL DATA:  Shortness of breath EXAM: PORTABLE CHEST 1 VIEW COMPARISON:  02/13/2019 FINDINGS: Cardiac shadow is stable. The lungs are well aerated bilaterally. No focal infiltrate or sizable effusion is noted. No bony abnormality is seen. IMPRESSION: No acute abnormality noted. Electronically Signed   By: Inez Catalina M.D.   On: 02/14/2019 07:51   Dg Chest Port 1 View  Result Date: 02/13/2019 CLINICAL DATA:  Shortness of breath. Respiratory failure and hypoxia. EXAM: PORTABLE CHEST 1 VIEW COMPARISON:  02/12/2019 FINDINGS: Prominent main pulmonary artery. Mild enlargement of the cardiopericardial silhouette indistinct opacity along the lingula. Left midlung scarring. Peripheral left basilar bandlike density. The right lung appears clear. Chronic deformity of the left lateral fifth and sixth ribs. IMPRESSION: 1. New indistinct lingular opacity peripherally. This could be from a pleural process or airspace opacity. 2. Continued peripheral scarring in the left mid lung with adjacent chronic rib deformities. 3. Mild enlargement of the cardiopericardial silhouette, without edema. 4. Prominent main pulmonary artery suggesting pulmonary arterial hypertension. Electronically Signed   By: Van Clines M.D.   On: 02/13/2019 08:03   Dg Chest Port 1 View  Result Date: 02/12/2019 CLINICAL DATA:  Acute chronic respiratory failure. EXAM: PORTABLE CHEST 1 VIEW COMPARISON:  02/11/2019 FINDINGS: Heart size is normal. No pleural effusion or edema. Stable chronic scarring within the left midlung. No superimposed airspace consolidation. IMPRESSION:  1. No active disease.  Unchanged left midlung scarring. Electronically Signed   By: Kerby Moors M.D.   On: 02/12/2019 08:28   Dg Chest Port 1 View  Result Date: 02/11/2019 CLINICAL DATA:  Shortness of breath. EXAM: PORTABLE CHEST 1 VIEW COMPARISON:  Chest x-rays dated 03/29/2015 and 01/11/2012. FINDINGS: Borderline cardiomegaly is stable. Fullness of the LEFT aortopulmonary window region, most likely a prominent main pulmonary artery suggesting chronic pulmonary artery hypertension. Increased interstitial prominence bilaterally, bibasilar predominant, LEFT slightly greater than RIGHT, presumably mild edema superimposed on chronic interstitial lung disease. No confluent opacity to suggest a consolidating pneumonia. No pleural effusion or pneumothorax seen. Osseous structures about the chest are unremarkable. IMPRESSION: 1. Probable mild bilateral interstitial edema superimposed on chronic interstitial lung disease. 2. No evidence of consolidating pneumonia or alveolar pulmonary edema. 3. Probable chronic pulmonary artery hypertension. 4. Stable borderline cardiomegaly. Electronically Signed   By: Franki Cabot M.D.   On: 02/11/2019 18:12   Dg Hips Bilat W Or Wo Pelvis 3-4 Views  Result Date: 03/01/2019 CLINICAL DATA:  Bilateral hip pain after fall today. EXAM: DG HIP (WITH OR WITHOUT PELVIS) 3-4V BILAT COMPARISON:  None. FINDINGS: There is no evidence of hip fracture or dislocation. There is no evidence of arthropathy or other  focal bone abnormality. IMPRESSION: Negative. Electronically Signed   By: Marijo Conception M.D.   On: 03/01/2019 14:45   Vas US Carotid (at [place] Only)  Result Date: 03/02/2019 Carotid Arterial Duplex Study Indications:       CVA. Risk Factors:      None. Limitations        Today's exam was limited due to the body habitus of the                    patient, patient movement, patient positioning and the                    patient's respiratory variation. Comparison Study:  No  prior studies. Performing Technologist: Oliver Hum RVT  Examination Guidelines: A complete evaluation includes B-mode imaging, spectral Doppler, color Doppler, and power Doppler as needed of all accessible portions of each vessel. Bilateral testing is considered an integral part of a complete examination. Limited examinations for reoccurring indications may be performed as noted.  Right Carotid Findings: +----------+--------+--------+--------+-----------------------+--------+           PSV cm/sEDV cm/sStenosisPlaque Description     Comments +----------+--------+--------+--------+-----------------------+--------+ CCA Prox  68      13              smooth and heterogenous         +----------+--------+--------+--------+-----------------------+--------+ CCA Distal65      11              smooth and heterogenous         +----------+--------+--------+--------+-----------------------+--------+ ICA Prox  58      14              smooth and heterogenoustortuous +----------+--------+--------+--------+-----------------------+--------+ ICA Distal57      22                                     tortuous +----------+--------+--------+--------+-----------------------+--------+ ECA       60      11                                              +----------+--------+--------+--------+-----------------------+--------+ +----------+--------+-------+--------+-------------------+           PSV cm/sEDV cmsDescribeArm Pressure (mmHG) +----------+--------+-------+--------+-------------------+ [id number]                                        +----------+--------+-------+--------+-------------------+ +---------+--------+--+--------+--+---------+ VertebralPSV cm/s73EDV cm/s24Antegrade +---------+--------+--+--------+--+---------+  Left Carotid Findings: +----------+--------+--------+--------+-----------------------+--------+           PSV cm/sEDV cm/sStenosisPlaque Description      Comments +----------+--------+--------+--------+-----------------------+--------+ CCA Prox  101     19              smooth and heterogenous         +----------+--------+--------+--------+-----------------------+--------+ CCA Distal79      16              smooth and heterogenous         +----------+--------+--------+--------+-----------------------+--------+ ICA Prox  80      25              smooth and heterogenoustortuous +----------+--------+--------+--------+-----------------------+--------+ ICA Distal60      29  tortuous +----------+--------+--------+--------+-----------------------+--------+ ECA       66                                                      +----------+--------+--------+--------+-----------------------+--------+ +----------+--------+--------+--------+-------------------+           PSV cm/sEDV cm/sDescribeArm Pressure (mmHG) +----------+--------+--------+--------+-------------------+ [id number]                                         +----------+--------+--------+--------+-------------------+ +---------+--------+--+--------+--+---------+ VertebralPSV cm/s72EDV cm/s27Antegrade +---------+--------+--+--------+--+---------+  Summary: Right Carotid: Velocities in the right ICA are consistent with a 1-39% stenosis. Left Carotid: Velocities in the left ICA are consistent with a 1-39% stenosis. Vertebrals: Bilateral vertebral arteries demonstrate antegrade flow. *See table(s) above for measurements and observations.  Electronically signed by Antony Contras MD on 03/02/2019 at 1:16:38 PM.    Final    Vas [name] Lower Extremity Venous (dvt)  Result Date: 03/02/2019  Lower Venous Study Indications: Pulmonary embolism, and stroke.  Risk Factors: None identified. Limitations: Body habitus, poor ultrasound/tissue interface and patient movement, patient positioning. Comparison Study: No prior studies. Performing  Technologist: Oliver Hum RVT  Examination Guidelines: A complete evaluation includes B-mode imaging, spectral Doppler, color Doppler, and power Doppler as needed of all accessible portions of each vessel. Bilateral testing is considered an integral part of a complete examination. Limited examinations for reoccurring indications may be performed as noted.  +---------+---------------+---------+-----------+----------+--------------+ RIGHT    CompressibilityPhasicitySpontaneityPropertiesThrombus Aging +---------+---------------+---------+-----------+----------+--------------+ CFV      Full           Yes      Yes                                 +---------+---------------+---------+-----------+----------+--------------+ SFJ      Full                                                        +---------+---------------+---------+-----------+----------+--------------+ FV Prox  Full                                                        +---------+---------------+---------+-----------+----------+--------------+ FV Mid   Full                                                        +---------+---------------+---------+-----------+----------+--------------+ FV DistalFull                                                        +---------+---------------+---------+-----------+----------+--------------+ PFV      Full                                                        +---------+---------------+---------+-----------+----------+--------------+  POP      Full           Yes      Yes                                 +---------+---------------+---------+-----------+----------+--------------+ PTV      Full                                                        +---------+---------------+---------+-----------+----------+--------------+ PERO     Full                                                         +---------+---------------+---------+-----------+----------+--------------+   +---------+---------------+---------+-----------+----------+--------------+ LEFT     CompressibilityPhasicitySpontaneityPropertiesThrombus Aging +---------+---------------+---------+-----------+----------+--------------+ CFV      Full           Yes      Yes                                 +---------+---------------+---------+-----------+----------+--------------+ SFJ      Full                                                        +---------+---------------+---------+-----------+----------+--------------+ FV Prox  Full                                                        +---------+---------------+---------+-----------+----------+--------------+ FV Mid   Full                                                        +---------+---------------+---------+-----------+----------+--------------+ FV DistalFull                                                        +---------+---------------+---------+-----------+----------+--------------+ PFV      Full                                                        +---------+---------------+---------+-----------+----------+--------------+ POP      Full           Yes      Yes                                 +---------+---------------+---------+-----------+----------+--------------+  PTV      Full                                                        +---------+---------------+---------+-----------+----------+--------------+ PERO     Full                                                        +---------+---------------+---------+-----------+----------+--------------+     Summary: Right: There is no evidence of deep vein thrombosis in the lower extremity. No cystic structure found in the popliteal fossa. Left: There is no evidence of deep vein thrombosis in the lower extremity. No cystic structure found in the popliteal fossa.  *See  table(s) above for measurements and observations. Electronically signed by Servando Snare MD on 03/02/2019 at 3:40:30 PM.    Final    [name] Ekg Site Rite  Result Date: 03/03/2019 If Site Rite image not attached, placement could not be confirmed due to current cardiac rhythm.   PHYSICAL EXAM  Temp:  [97.5 F (36.4 C)-97.8 F (36.6 C)] 97.8 F (36.6 C) (09/06 0333) Pulse Rate:  [83-90] 83 (09/06 0806) Resp:  [17-26] 17 (09/06 0806) BP: (157-167)/(65-89) 160/80 (09/06 0806) SpO2:  [88 %-100 %] 100 % (09/06 0806) FiO2 (%):  [3 %] 3 % (09/06 0333)  General - Well nourished, well developed, not in acute distress.  Ophthalmologic - fundi not visualized due to noncooperation.  Cardiovascular - Regular rate and rhythm.  Neuro - awake, alert, orientated to self, not able to answer other orientation questions but perseverated on her name.  Not appropriate on exam.  Mild dysarthria.  Not following commands.  PERRLA, attending to both sides, inconsistent blinking to visual threat bilaterally.  No significance facial droop.  Moving all extremities symmetrically by observation. Sensation, coordination and gait not tested.   ASSESSMENT/PLAN Ms. FREDRICK DRAY is a 73 y.o. female with history of non-small cell lung cancer s/p radiation therapy, HIV, Hep B, COPD, HLD, CKD stg 3, and legally blind who presented with fall, generalized weakness, and chest pain, found to have embolic infarcts in R MCA territory with subacute hemorrhagic component along with PE and prominent thrombus in aortic arch.  Stroke: Acute R MCA territory infarct including right temporal, insular cortex, BG infarcts, with hemorrhagic conversion at BG, embolic patter concerning for PFO in the setting of especially in setting of PE   CT head - Acute right MCA territory ischemic infarct. 10 mm hemorrhage in the right basal ganglia. No midline shift or hydrocephalus.  Recommand MRI with and without contrast once corporative to evaluate for  stroke and rule out brain mass, PML or CNS infection in the setting of hx of cancer and HIV  CTA head and neck pending  2D Echo EF > 65%, moderate pulmonary HTN, no PFO seen  Carotid Doppler unremarkable  LE venous Doppler negative for DVT  LDL 48  HgbA1c 6.4  Hypercoagulable work-up - 03/01/19 - so far negative, but some still pending  UDS - 03/04/19 - pending  IV heparin for VTE ppx  ASA 81 prior to admission, now on heparin IV due to PE, aortic arch thrombus and embolism. Will  consider switch to DOAC once stable and hemorrhagic conversion improves  Therapy recommendations:  SNF  Disposition: pending  Embolism  CT chest showed primary emboli and emboli in aortic arch  CT abdomen pelvis showed right renal infarcts as well as splenic infarct.  No cancer  Right MCA territory infarct  LE venous Doppler negative for DVT  Concerning for PFO related embolism in the setting of PE  Tentative TEE Tuesday to rule out PFO and endocarditis in the pt with hx of HIV and substance abuse  Acute hypercapnic respiratory failure  rapid response was called 9/4 early am for sudden mental status change  BP in 50s with agonal breathing  ABG PCO2 80  History of COPD  Treated with BiPAP, much improved  Now on low-dose Solu-Medrol  Management as per primary team  HIV  CD4 count pending  Viral load pending  CD4 on 02/13/2019 was 406  Substance abuse  As per family, 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 Pt agitation and restless and body aches ? part of the substance withdraw syndrome  Substance cessation education will be provided  Tobacco abuse  Current smoker  Smoking cessation counseling will be provided  Pt is willing to quit  Other Stroke Risk Factors  Advanced age  Obesity, Body mass index is 32.78 kg/m., recommend weight loss, diet and exercise as appropriate    LLL cancer T1b s/p radiation  Other active issues  Severe  COPD  Hepatitis B  Legally blind  Agitation - Zyprexa per Palliative Care. (also has prn Haldol available)  Pain - Oxycodone, gabapentin and Robaxin per [facility] day #4  Rosalin Hawking, MD PhD Stroke Neurology 03/05/2019 11:56 AM      To contact Stroke Continuity provider, please refer to [email or web address] After hours, contact General Neurology

## 2019-03-05 NOTE — TOC Initial Note (Signed)
Transition of Care [facility]) - Initial/Assessment Note    Patient Details  Name: [name] MRN: [id number] Date of Birth: 1946-03-23  Transition of Care [facility]) CM/SW Contact:    Gelene Mink, [place] Phone Number: 03/05/2019, 10:49 AM  Clinical Narrative:                  CSW called and spoke with the patient's brother, BJ Eager. CSW introduced herself and explained her role. CSW provided the therapy recommendation. BJ has already been speaking with Haynes Dage, PMT PA. He and the patient's family are agreeable to the patient going to rehab. Their first preference would be [facility]. The patient's sister Britt Boozer is a long-term care resident there. CSW asked if the patient was not able to get into [facility], did the family have any other preferences. He didn't, he provided permission for the CSW to fax the patient out to facilities in [place]. CSW provided a copy of the CMS SNF List by email.   CSW will continue to follow and assist with disposition planning.    Expected Discharge Plan: Skilled Nursing Facility Barriers to Discharge: [facility], [place] (PASRR), Continued Medical Work up   Patient Goals and CMS Choice Patient states their goals for this hospitalization and ongoing recovery are:: Pt family is agreeable to rehab CMS Medicare.gov Compare Post Acute Care list provided to:: Patient Represenative (must comment) Choice offered to / list presented to : Sibling  Expected Discharge Plan and Services Expected Discharge Plan: [place] In-house Referral: Clinical Social Work Discharge Planning Services: NA Post Acute Care Choice: [place] Living arrangements for the past 2 months: Single Family Home                 DME Arranged: N/A DME Agency: NA       HH Arranged: NA [place] Agency: NA        Prior Living Arrangements/Services Living arrangements for the past 2 months: [place] Lives with::  Self Patient language and need for interpreter reviewed:: No Do you feel safe going back to the place where you live?: Yes      Need for Family Participation in Patient Care: Yes (Comment) Care giver support system in place?: Yes (comment) Current home services: DME(Has home 02) Criminal Activity/Legal Involvement Pertinent to Current Situation/Hospitalization: No - Comment as needed  Activities of Daily Living      Permission Sought/Granted Permission sought to share information with : Case Manager Permission granted to share information with : Yes, Verbal Permission Granted  Share Information with NAME: BJ  Permission granted to share info w AGENCY: All SNF  Permission granted to share info w Relationship: Brother  Permission granted to share info w Contact Information: [phone]  Emotional Assessment Appearance:: Appears stated age Attitude/Demeanor/Rapport: Unable to Assess Affect (typically observed): Unable to Assess Orientation: : Oriented to Self Alcohol / Substance Use: Not Applicable Psych Involvement: No (comment)  Admission diagnosis:  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([place]) [N28.0] Splenic infarct [D73.5] Aortic embolism or thrombosis ([place]) [I74.10] Fall in home, initial encounter [W19.XXXA, A12.878] Acute pulmonary embolism without acute cor pulmonale, unspecified pulmonary embolism type ([place]) [I26.99] Patient Active Problem List   Diagnosis Date Noted  . DNR (do not resuscitate)   . Altered mental status   . Acute pulmonary embolism without acute cor pulmonale (HCC)   . Acute ischemic right MCA stroke ([place])   . Palliative care encounter   . Aortic embolism or thrombosis ([place])   .  CVA (cerebral vascular accident) ([place]) 03/02/2019  . COPD (chronic obstructive pulmonary disease) ([place]) 03/01/2019  . PE (pulmonary thromboembolism) ([place]) 03/01/2019  . Tobacco abuse 03/01/2019  . 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[place]) 03/01/2019  . CKD (chronic kidney disease), stage III ([place]) 03/01/2019  .  Generalized weakness 03/01/2019  . Fall at home   . 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([place])   . Splenic infarct   . Acute and chronic respiratory failure (acute-on-chronic) ([place]) 02/11/2019  . Sepsis ([place]) 03/28/2015  . CAP (community acquired pneumonia) 03/28/2015  . UTI (lower urinary tract infection) 03/28/2015  . Respiratory failure ([place]) 03/28/2015  . Non-small cell lung cancer ([place])   . Malignant neoplasm of upper lobe, bronchus or lung 01/03/2014  . Acute hypoxemic respiratory failure ([place]) 10/31/2013  . COPD with acute exacerbation ([place]) 10/30/2013  . COPD exacerbation ([place]) 10/30/2013  . Other and unspecified noninfectious gastroenteritis and colitis(558.9) 11/20/2012  . Rectal bleed 11/19/2012  . Obesity 05/05/2012  . Renal insufficiency 05/05/2012  . Hyperglycemia 05/05/2012  . Asthma 12/10/2011  . Cigarette smoker 12/10/2011  . Hyperlipidemia 12/11/2010  . Hypertension 12/11/2010  . ALLERGIC RHINITIS, SEASONAL 07/03/2010  . COPD 07/03/2010  . Human immunodeficiency virus (HIV) disease ([place]) 12/09/2006  . DEPRESSION 12/09/2006  . BREAST MASS, BENIGN 12/09/2006  . OSTEOPOROSIS 12/09/2006   PCP:  System, Provider Not In Pharmacy:   [facility], [place] [address] [place] [address] [place] [address] Phone: [phone] Fax: [place], [place] [address] Phone: [phone] Fax: [phone]  [place], [place] [address] [address] Phone: [phone] Fax: [phone]     Social Determinants of Health (SDOH) Interventions    Readmission Risk Interventions No flowsheet data found.

## 2019-03-06 ENCOUNTER — Inpatient Hospital Stay (HOSPITAL_COMMUNITY): Payer: Medicare Other

## 2019-03-06 LAB — CBC
HCT: 57.7 % — ABNORMAL HIGH (ref 36.0–46.0)
Hemoglobin: 15.9 g/dL — ABNORMAL HIGH (ref 12.0–15.0)
MCH: 23.4 pg — ABNORMAL LOW (ref 26.0–34.0)
MCHC: 27.6 g/dL — ABNORMAL LOW (ref 30.0–36.0)
MCV: 84.9 fL (ref 80.0–100.0)
Platelets: 121 10*3/uL — ABNORMAL LOW (ref 150–400)
RBC: 6.8 MIL/uL — ABNORMAL HIGH (ref 3.87–5.11)
RDW: 23.7 % — ABNORMAL HIGH (ref 11.5–15.5)
WBC: 6.2 10*3/uL (ref 4.0–10.5)
nRBC: 0 % (ref 0.0–0.2)

## 2019-03-06 LAB — HEPARIN LEVEL (UNFRACTIONATED): Heparin Unfractionated: 0.51 IU/mL (ref 0.30–0.70)

## 2019-03-06 MED ORDER — APIXABAN 5 MG PO TABS
10.0000 mg | ORAL_TABLET | Freq: Two times a day (BID) | ORAL | Status: DC
  Administered 2019-03-06 – 2019-03-10 (×8): 10 mg via ORAL
  Filled 2019-03-06 (×9): qty 2

## 2019-03-06 MED ORDER — IOHEXOL 350 MG/ML SOLN
80.0000 mL | Freq: Once | INTRAVENOUS | Status: AC | PRN
  Administered 2019-03-06: 80 mL via INTRAVENOUS

## 2019-03-06 MED ORDER — APIXABAN 5 MG PO TABS: 5.0000 mg | ORAL_TABLET | Freq: Two times a day (BID) | ORAL | Status: DC

## 2019-03-06 NOTE — Progress Notes (Signed)
Patient resting comfortably, BIPAP not needed at this time. Will continue to monitor patient.

## 2019-03-06 NOTE — Progress Notes (Signed)
STROKE TEAM PROGRESS NOTE   INTERVAL HISTORY Pt lying in bed, sleepy drowsy briefly open eyes on voice. Not cooperative on exam. Moving all extremities on pain.    Vitals:   03/05/19 1534 03/05/19 1940 03/05/19 2326 03/06/19 0334  BP: (!) 163/97 (!) 161/87 131/79 (!) 145/82  Pulse: 95 95 86 85  Resp: 20 18 19 14   Temp: 98.1 F (36.7 C) 98.2 F (36.8 C) 98.7 F (37.1 C) 97.6 F (36.4 C)  TempSrc: Oral Oral Oral Oral  SpO2: 92% 100% 95% 95%  Weight:      Height:        CBC:  Recent Labs  Lab 03/01/19 1330  03/05/19 0523 03/06/19 0412  WBC 11.6*   < > 7.4 6.2  NEUTROABS 8.0*  --   --   --   HGB 15.1*   < > 13.0 15.9*  HCT 55.0*   < > 47.1* 57.7*  MCV 86.2   < > 85.6 84.9  PLT 149*   < > 128* 121*   < > = values in this interval not displayed.    Basic Metabolic Panel:  Recent Labs  Lab 03/03/19 1625 03/04/19 0332  NA 142 140  K 3.9 4.2  CL 105 104  CO2 29 25  GLUCOSE 89 96  BUN 8 8  CREATININE 0.86 0.85  CALCIUM 8.2* 8.2*   Lipid Panel:     Component Value Date/Time   CHOL 77 03/03/2019 0500   TRIG 70 03/03/2019 0500   HDL 15 (L) 03/03/2019 0500   CHOLHDL 5.1 03/03/2019 0500   VLDL 14 03/03/2019 0500   LDLCALC 48 03/03/2019 0500   HgbA1c:  Lab Results  Component Value Date   HGBA1C 6.4 (H) 03/03/2019   Urine Drug Screen:     Component Value Date/Time   LABOPIA NONE DETECTED 03/04/2019 1734   COCAINSCRNUR NONE DETECTED 03/04/2019 1734   LABBENZ NONE DETECTED 03/04/2019 1734   AMPHETMU NONE DETECTED 03/04/2019 1734   THCU NONE DETECTED 03/04/2019 1734   LABBARB NONE DETECTED 03/04/2019 1734    Alcohol Level No results found for: ETH  IMAGING Ct Angio Head W Or Wo Contrast  Result Date: 03/06/2019 CLINICAL DATA:  Follow-up examination for acute stroke. EXAM: CT ANGIOGRAPHY HEAD AND NECK TECHNIQUE: Multidetector CT imaging of the head and neck was performed using the standard protocol during bolus administration of intravenous contrast.  Multiplanar CT image reconstructions and MIPs were obtained to evaluate the vascular anatomy. Carotid stenosis measurements (when applicable) are obtained utilizing NASCET criteria, using the distal internal carotid diameter as the denominator. CONTRAST:  6mL OMNIPAQUE IOHEXOL 350 MG/ML SOLN COMPARISON:  Prior CT from 03/03/2019. FINDINGS: CT HEAD FINDINGS Brain: Evolving subacute predominantly right MCA territory infarct involving the right parietotemporal region again seen, relatively stable in size and distribution from previous. Probable associated faint petechial hemorrhage without hemorrhagic transformation or significant mass effect. 10 mm subacute intraparenchymal hemorrhage at the right lentiform nucleus again seen as well, relatively similar in size, but with decreased attenuation as compared to previous. Mild localized edema without significant regional mass effect. No other new acute intracranial hemorrhage. No acute large vessel territory infarct. 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 No mass lesion or midline shift. No hydrocephalus. No extra-axial fluid collection. Vascular: No hyperdense vessel. Scattered calcified atherosclerosis at the skull base.  Skull: Scalp soft tissues and calvarium within normal limits. Sinuses: Trace layering opacity noted within the left sphenoid sinus. Paranasal sinuses are otherwise clear. No mastoid effusion. Orbits: Globes orbital soft tissues demonstrate no acute finding. Remote posttraumatic defect noted at the left orbital floor. Review of the MIP images confirms the above findings CTA NECK FINDINGS Aortic arch: Previously identified intraluminal thrombus protruding into the aortic arch again seen, grossly similar to prior chest CT (series 13, image 323, 328). Moderate atherosclerotic irregularity  throughout the visualized arch itself. No hemodynamically significant stenosis seen about the origin of the great vessels. Visualized subclavian arteries widely patent. Right carotid system: Right CCA patent from its origin to the bifurcation without stenosis. Medial is a shin of the right common carotid artery into the retropharyngeal space. Atheromatous irregularity about the right bifurcation/proximal right ICA with associated stenosis of up to 30% by NASCET criteria. Right ICA tortuous but otherwise patent to the skull base without stenosis, dissection or occlusion. Left carotid system: Small amount of intraluminal thrombus seen protruding into the left common carotid artery at its origin (series 13, image 309). Left CCA patent from its origin to the bifurcation without flow-limiting stenosis. Medialization of the left common carotid artery into the retropharyngeal space. Mild atheromatous irregularity about the left carotid bifurcation without hemodynamically significant stenosis (no more than 25%). Left ICA tortuous but otherwise widely patent to the skull base without stenosis, dissection, or occlusion. Vertebral arteries: Both vertebral arteries arise from the subclavian arteries. Vertebral arteries mildly tortuous but widely patent within the neck without stenosis, dissection, or occlusion. Skeleton: No acute osseous abnormality. No discrete osseous lesions. 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 Patient is edentulous. Other neck: No other acute soft tissue abnormality within the neck. Approximate 19 mm left thyroid nodule noted, with additional 1 cm adjacent left thyroid nodule. Right thyroid gland hypoplastic. Upper chest: Visualized upper chest demonstrates no other acute finding. Emphysematous changes noted. Review of the MIP images confirms the above findings CTA HEAD FINDINGS Anterior circulation: Petrous segments widely patent bilaterally. Scattered atheromatous irregularity  within the cavernous/supraclinoid ICAs without hemodynamically significant stenosis. ICA termini well perfused. A1 segments widely patent. Normal anterior communicating artery. Anterior cerebral arteries patent to their distal aspects without stenosis. Right M1 widely patent. Normal right MCA bifurcation. There is apparent abrupt occlusion of a proximal right M3 branch at its origin, best seen on coronal reformatted images (series 14, image 112). Finding consistent with the evolving subacute right MCA territory infarct. Distal right MCA branches otherwise well perfused. Left M1 widely patent. Normal left MCA bifurcation. Distal left MCA branches well perfused. Posterior circulation: Vertebral arteries patent to the vertebrobasilar junction without stenosis. Left vertebral artery dominant. Posterior inferior cerebral arteries patent bilaterally. Basilar patent to its distal aspect without stenosis. Superior cerebral arteries patent bilaterally. Both of the posterior cerebral arteries primarily supplied via the basilar and are well perfused their distal aspects. Venous sinuses: Patent. Anatomic variants: None significant. Review of the MIP images confirms the above findings IMPRESSION: CT HEAD IMPRESSION: 1. Normal expected interval evolution of subacute right MCA territory infarcts, stable in size and distribution as compared to previous exams. 2. 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 No evidence for interval re-bleeding or significant mass effect. 3. Focal area of confluent hypodensity involving the high parasagittal right frontal lobe, stable from previous. Finding is indeterminate, and could reflect sequelae of chronic ischemia  or other insult. 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 CTA HEAD AND NECK IMPRESSION: 1.  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 2. 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 Finding could serve as an embolic source. 3. 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 4. Mild to moderate scattered atheromatous change elsewhere about the major arterial vasculature of the head and neck. No other hemodynamically significant or correctable stenosis identified. Electronically Signed   By: Jeannine Boga M.D.   On: 03/06/2019 03:59   Dg Chest 2 View  Result Date: 03/01/2019 CLINICAL DATA:  Anterior chest pain post fall. EXAM: CHEST - 2 VIEW COMPARISON:  February 14, 2019 FINDINGS: Cardiomediastinal silhouette is normal. Mediastinal contours appear intact. Tortuosity of the aorta. There is no evidence of pleural effusion or pneumothorax. Linear opacities in the left mid thorax, possibly posttreatment or postsurgical changes. No radiographic evidence of displaced rib fractures. Soft tissues are grossly normal. IMPRESSION: 1. Linear opacities in the left mid thorax, possibly posttreatment or postsurgical changes. 2. No radiographic evidence of displaced rib fractures. Electronically Signed   By: Fidela Salisbury M.D.   On: 03/01/2019 14:48   Ct Head Wo Contrast  Result Date: 03/03/2019 CLINICAL DATA:  Focal neuro deficit. Unresponsive. 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 EXAM: CT HEAD WITHOUT CONTRAST TECHNIQUE: Contiguous axial images were obtained from the base of the skull through the vertex without intravenous contrast. COMPARISON:  Head CT 03/02/2019 FINDINGS: Brain: A 10 mm hemorrhage in the right lentiform nucleus with mild surrounding edema is unchanged. Additional patchy areas of hypoattenuation involving cortex and white matter in the right temporal lobe,  inferior right parietal lobe, and posteromedial right frontal lobe appears slightly more well-defined than on the prior study. No new intracranial hemorrhage, definite new infarct, midline shift, or extra-axial fluid collection is identified. The ventricles are normal in size. Background chronic small vessel ischemic changes are present in the cerebral white matter bilaterally. Vascular: Calcified atherosclerosis at the skull base. No hyperdense vessel. Skull: No acute fracture or focal osseous lesion. Sinuses/Orbits: Old left orbital floor fracture. Mild right periorbital soft tissue swelling. Mild mucosal thickening in the paranasal sinuses. Clear mastoid air cells. Other: None. IMPRESSION: Evolving acute right cerebral infarcts primarily in the MCA territory. Unchanged 10 mm right basal ganglia hemorrhage. Electronically Signed   By: Logan Bores M.D.   On: 03/03/2019 05:21   Ct Head Wo Contrast  Result Date: 03/02/2019 CLINICAL DATA:  [place] out of bed. EXAM: CT HEAD WITHOUT CONTRAST TECHNIQUE: Contiguous axial images were obtained from the base of the skull through the vertex without intravenous contrast. COMPARISON:  MR brain dated Nov 22, 2013. FINDINGS: Brain: There is hypodensity and loss of the gray-white matter differentiation predominantly involving the right temporal lobe, consistent with acute infarct. 8 x 8 x 10 mm area of hemorrhage in the right basal ganglia with surrounding edema. No midline shift, hydrocephalus, or extra-axial collection. Vascular: Atherosclerotic vascular calcification of the carotid siphons. No hyperdense vessel. Skull: Normal. Negative for fracture or focal lesion. Sinuses/Orbits: No acute finding. Mild ethmoid air cell mucosal thickening. Other: None. IMPRESSION: 1. Acute right MCA territory ischemic infarct. 10 mm hemorrhage in the right basal ganglia. No midline shift or hydrocephalus. Critical  Value/emergent results were called by telephone at the time of interpretation on  03/02/2019 at 7:55 am to Dr. Debbe Odea, who verbally acknowledged these results. Electronically Signed   By: Titus Dubin M.D.   On: 03/02/2019 07:59   Ct Angio Neck W Or Wo Contrast  Result Date: 03/06/2019 CLINICAL DATA:  Follow-up examination for acute stroke. EXAM: CT ANGIOGRAPHY HEAD AND NECK TECHNIQUE: Multidetector CT imaging of the head and neck was performed using the standard protocol during bolus administration of intravenous contrast. Multiplanar CT image reconstructions and MIPs were obtained to evaluate the vascular anatomy. Carotid stenosis measurements (when applicable) are obtained utilizing NASCET criteria, using the distal internal carotid diameter as the denominator. CONTRAST:  48mL OMNIPAQUE IOHEXOL 350 MG/ML SOLN COMPARISON:  Prior CT from 03/03/2019. FINDINGS: CT HEAD FINDINGS Brain: Evolving subacute predominantly right MCA territory infarct involving the right parietotemporal region again seen, relatively stable in size and distribution from previous. Probable associated faint petechial hemorrhage without hemorrhagic transformation or significant mass effect. 10 mm subacute intraparenchymal hemorrhage at the right lentiform nucleus again seen as well, relatively similar in size, but with decreased attenuation as compared to previous. Mild localized edema without significant regional mass effect. No other new acute intracranial hemorrhage. No acute large vessel territory infarct. 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 No mass lesion or midline shift. No hydrocephalus. No extra-axial fluid collection. Vascular: No hyperdense vessel. Scattered calcified atherosclerosis at the skull base. Skull: Scalp soft tissues and calvarium within normal limits. Sinuses: Trace layering opacity noted within the left sphenoid sinus.  Paranasal sinuses are otherwise clear. No mastoid effusion. Orbits: Globes orbital soft tissues demonstrate no acute finding. Remote posttraumatic defect noted at the left orbital floor. Review of the MIP images confirms the above findings CTA NECK FINDINGS Aortic arch: Previously identified intraluminal thrombus protruding into the aortic arch again seen, grossly similar to prior chest CT (series 13, image 323, 328). Moderate atherosclerotic irregularity throughout the visualized arch itself. No hemodynamically significant stenosis seen about the origin of the great vessels. Visualized subclavian arteries widely patent. Right carotid system: Right CCA patent from its origin to the bifurcation without stenosis. Medial is a shin of the right common carotid artery into the retropharyngeal space. Atheromatous irregularity about the right bifurcation/proximal right ICA with associated stenosis of up to 30% by NASCET criteria. Right ICA tortuous but otherwise patent to the skull base without stenosis, dissection or occlusion. Left carotid system: Small amount of intraluminal thrombus seen protruding into the left common carotid artery at its origin (series 13, image 309). Left CCA patent from its origin to the bifurcation without flow-limiting stenosis. Medialization of the left common carotid artery into the retropharyngeal space. Mild atheromatous irregularity about the left carotid bifurcation without hemodynamically significant stenosis (no more than 25%). Left ICA tortuous but otherwise widely patent to the skull base without stenosis, dissection, or occlusion. Vertebral arteries: Both vertebral arteries arise from the subclavian arteries. Vertebral arteries mildly tortuous but widely patent within the neck without stenosis, dissection, or occlusion. Skeleton: No acute osseous abnormality. No discrete osseous lesions. 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 Patient is edentulous. Other neck: No  other acute soft tissue abnormality within the neck. Approximate 19 mm left thyroid nodule noted, with additional 1 cm adjacent left thyroid nodule. Right thyroid gland hypoplastic. Upper chest:  Visualized upper chest demonstrates no other acute finding. Emphysematous changes noted. Review of the MIP images confirms the above findings CTA HEAD FINDINGS Anterior circulation: Petrous segments widely patent bilaterally. Scattered atheromatous irregularity within the cavernous/supraclinoid ICAs without hemodynamically significant stenosis. ICA termini well perfused. A1 segments widely patent. Normal anterior communicating artery. Anterior cerebral arteries patent to their distal aspects without stenosis. Right M1 widely patent. Normal right MCA bifurcation. There is apparent abrupt occlusion of a proximal right M3 branch at its origin, best seen on coronal reformatted images (series 14, image 112). Finding consistent with the evolving subacute right MCA territory infarct. Distal right MCA branches otherwise well perfused. Left M1 widely patent. Normal left MCA bifurcation. Distal left MCA branches well perfused. Posterior circulation: Vertebral arteries patent to the vertebrobasilar junction without stenosis. Left vertebral artery dominant. Posterior inferior cerebral arteries patent bilaterally. Basilar patent to its distal aspect without stenosis. Superior cerebral arteries patent bilaterally. Both of the posterior cerebral arteries primarily supplied via the basilar and are well perfused their distal aspects. Venous sinuses: Patent. Anatomic variants: None significant. Review of the MIP images confirms the above findings IMPRESSION: CT HEAD IMPRESSION: 1. Normal expected interval evolution of subacute right MCA territory infarcts, stable in size and distribution as compared to previous exams. 2. Normal expected interval evolution of 10 mm right basal ganglia hemorrhage, likely subacute right MCA territory infarct  with hemorrhagic transformation. No evidence for interval re-bleeding or significant mass effect. 3. Focal area of confluent hypodensity involving the high parasagittal right frontal lobe, stable from previous. Finding is indeterminate, and could reflect sequelae of chronic ischemia or other insult. 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 CTA HEAD AND NECK IMPRESSION: 1. 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 2. 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 Finding could serve as an embolic source. 3. 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 4. Mild to moderate scattered atheromatous change elsewhere about the major arterial vasculature of the head and neck. No other hemodynamically significant or correctable stenosis identified. Electronically Signed   By: Jeannine Boga M.D.   On: 03/06/2019 03:59   Ct Chest W Contrast  Result Date: 03/01/2019 CLINICAL DATA:  Patient status post fall out of bed at 6 a.m. this morning. Bilateral lower extremity weakness. EXAM: CT CHEST, ABDOMEN, AND PELVIS WITH CONTRAST TECHNIQUE: Multidetector CT imaging of the chest, abdomen and pelvis was performed following the standard protocol during bolus administration of intravenous contrast. CONTRAST:  160mL OMNIPAQUE IOHEXOL 300 MG/ML  SOLN COMPARISON:  CT chest 11/03/2013.  PET CT scan 11/22/2013. FINDINGS: CT CHEST FINDINGS Cardiovascular: Pulmonary embolus are seen in right pulmonary arteries such as on image 35 of series 6 and 64-67 of series 6. Emboli are also seen in the aortic arch such as on images 21 and 22 of series 3 and 69 to 76 of series 7. Heart size is normal. No pleural or pericardial effusion. Calcific  aortic and coronary atherosclerosis. Mediastinum/Nodes: No enlarged mediastinal, hilar, or axillary lymph nodes. Thyroid gland, trachea, and esophagus demonstrate no significant findings. Lungs/Pleura: Lungs are emphysematous. There is scar in the left upper lobe at the site of patient's prior lung cancer. Mild dependent atelectasis is noted. Musculoskeletal: No acute or focal bony abnormality. CT  ABDOMEN PELVIS FINDINGS Hepatobiliary: No focal liver abnormality is seen. No gallstones, gallbladder wall thickening, or biliary dilatation. The liver is low attenuating consistent with fatty infiltration. Pancreas: 0.4 cm hypoattenuating lesion at the junction of the tail and body on image 65 is likely due to some prior inflammatory process. No peripancreatic inflammatory change. No pancreatic duct dilatation. Spleen: Wedge-shaped hypoattenuation in the superior aspect of the spleen measuring 2.7 cm AP x 2.7 cm transverse by 2.0 cm craniocaudal is consistent with infarct. Spleen is otherwise unremarkable. Adrenals/Urinary Tract: The adrenal glands appear normal. Wedge-shaped area peripheral hypoattenuation in the mid to upper pole of the right kidney is compatible with an infarct. More subtle area of decreased cortical attenuation in the more superior aspect of the right kidney is also worrisome for infarct. Left kidney appears normal. Ureters and urinary bladder are unremarkable. Stomach/Bowel: Stomach is within normal limits. Appendix appears normal. No evidence of bowel wall thickening, distention, or inflammatory changes. Vascular/Lymphatic: Aortic atherosclerosis. No enlarged abdominal or pelvic lymph nodes. Reproductive: Uterus and bilateral adnexa are unremarkable. Other: None. Musculoskeletal: No acute or focal abnormality. Degenerative disc disease L5-S1 noted. 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 Findings raise the possibility for  ventricular or atrial septal defect. No evidence of trauma. Fatty infiltration of the liver. Atherosclerosis. Emphysema. These results were called by telephone at the time of interpretation on 03/01/2019 at 7:29 pm to [facility], who verbally acknowledged these results. Electronically Signed   By: Inge Rise M.D.   On: 03/01/2019 19:36   Ct Abdomen Pelvis W Contrast  Result Date: 03/01/2019 CLINICAL DATA:  Patient status post fall out of bed at 6 a.m. this morning. Bilateral lower extremity weakness. EXAM: CT CHEST, ABDOMEN, AND PELVIS WITH CONTRAST TECHNIQUE: Multidetector CT imaging of the chest, abdomen and pelvis was performed following the standard protocol during bolus administration of intravenous contrast. CONTRAST:  167mL OMNIPAQUE IOHEXOL 300 MG/ML  SOLN COMPARISON:  CT chest 11/03/2013.  PET CT scan 11/22/2013. FINDINGS: CT CHEST FINDINGS Cardiovascular: Pulmonary embolus are seen in right pulmonary arteries such as on image 47 of series 6 and 64-67 of series 6. Emboli are also seen in the aortic arch such as on images 21 and 22 of series 3 and 69 to 76 of series 7. Heart size is normal. No pleural or pericardial effusion. Calcific aortic and coronary atherosclerosis. Mediastinum/Nodes: No enlarged mediastinal, hilar, or axillary lymph nodes. Thyroid gland, trachea, and esophagus demonstrate no significant findings. Lungs/Pleura: Lungs are emphysematous. There is scar in the left upper lobe at the site of patient's prior lung cancer. Mild dependent atelectasis is noted. Musculoskeletal: No acute or focal bony abnormality. CT ABDOMEN PELVIS FINDINGS Hepatobiliary: No focal liver abnormality is seen. No gallstones, gallbladder wall thickening, or biliary dilatation. The liver is low attenuating consistent with fatty infiltration. Pancreas: 0.4 cm hypoattenuating lesion at the junction of the tail and body on image 65 is likely due to some prior inflammatory process. No peripancreatic  inflammatory change. No pancreatic duct dilatation. Spleen: Wedge-shaped hypoattenuation in the superior aspect of the spleen measuring 2.7 cm AP x 2.7 cm transverse by 2.0 cm craniocaudal is consistent with infarct. Spleen is otherwise unremarkable. Adrenals/Urinary Tract: The adrenal glands appear normal. Wedge-shaped area peripheral hypoattenuation in the mid to upper pole of the right kidney is compatible with an infarct. More subtle area of decreased cortical attenuation in the more superior aspect of the right kidney  is also worrisome for infarct. Left kidney appears normal. Ureters and urinary bladder are unremarkable. Stomach/Bowel: Stomach is within normal limits. Appendix appears normal. No evidence of bowel wall thickening, distention, or inflammatory changes. Vascular/Lymphatic: Aortic atherosclerosis. No enlarged abdominal or pelvic lymph nodes. Reproductive: Uterus and bilateral adnexa are unremarkable. Other: None. Musculoskeletal: No acute or focal abnormality. Degenerative disc disease L5-S1 noted. 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 Findings raise the possibility for ventricular or atrial septal defect. No evidence of trauma. Fatty infiltration of the liver. Atherosclerosis. Emphysema. These results were called by telephone at the time of interpretation on 03/01/2019 at 7:29 pm to [facility], who verbally acknowledged these results. Electronically Signed   By: Inge Rise M.D.   On: 03/01/2019 19:36   Dg Chest Port 1 View  Result Date: 03/03/2019 CLINICAL DATA:  Respiratory failure EXAM: PORTABLE CHEST 1 VIEW COMPARISON:  03/01/2019 FINDINGS: Cardiac shadow is mildly enlarged but stable. Aortic calcifications are again seen. The lungs are well aerated bilaterally. Increased density in the lateral aspect of the right lung base is noted likely related to the patient's known pulmonary emboli. Irregularity of the  fifth and sixth ribs on the left laterally is seen. These were not present on prior chest x-rays from 2016 and likely related to the patient's known history of prior lung carcinoma with mild healing. No acute fracture is seen. IMPRESSION: Slight increase in parenchymal opacity in the right lung base laterally likely related to the known underlying pulmonary emboli. Old deformities of the fifth and sixth ribs on the left likely related to the patient's known history of prior lung carcinoma. Electronically Signed   By: Inez Catalina M.D.   On: 03/03/2019 06:10   Dg Chest Port 1 View  Result Date: 02/14/2019 CLINICAL DATA:  Shortness of breath EXAM: PORTABLE CHEST 1 VIEW COMPARISON:  02/13/2019 FINDINGS: Cardiac shadow is stable. The lungs are well aerated bilaterally. No focal infiltrate or sizable effusion is noted. No bony abnormality is seen. IMPRESSION: No acute abnormality noted. Electronically Signed   By: Inez Catalina M.D.   On: 02/14/2019 07:51   Dg Chest Port 1 View  Result Date: 02/13/2019 CLINICAL DATA:  Shortness of breath. Respiratory failure and hypoxia. EXAM: PORTABLE CHEST 1 VIEW COMPARISON:  02/12/2019 FINDINGS: Prominent main pulmonary artery. Mild enlargement of the cardiopericardial silhouette indistinct opacity along the lingula. Left midlung scarring. Peripheral left basilar bandlike density. The right lung appears clear. Chronic deformity of the left lateral fifth and sixth ribs. IMPRESSION: 1. New indistinct lingular opacity peripherally. This could be from a pleural process or airspace opacity. 2. Continued peripheral scarring in the left mid lung with adjacent chronic rib deformities. 3. Mild enlargement of the cardiopericardial silhouette, without edema. 4. Prominent main pulmonary artery suggesting pulmonary arterial hypertension. Electronically Signed   By: Van Clines M.D.   On: 02/13/2019 08:03   Dg Chest Port 1 View  Result Date: 02/12/2019 CLINICAL DATA:  Acute  chronic respiratory failure. EXAM: PORTABLE CHEST 1 VIEW COMPARISON:  02/11/2019 FINDINGS: Heart size is normal. No pleural effusion or edema. Stable chronic scarring within the left midlung. No superimposed airspace consolidation. IMPRESSION: 1. No active disease.  Unchanged left midlung scarring. Electronically Signed   By: Kerby Moors M.D.   On: 02/12/2019 08:28   Dg Chest Port 1 View  Result Date: 02/11/2019 CLINICAL DATA:  Shortness of breath. EXAM: PORTABLE CHEST 1 VIEW COMPARISON:  Chest x-rays dated 03/29/2015 and 01/11/2012. FINDINGS: Borderline cardiomegaly is stable. Fullness of the LEFT aortopulmonary window region, most likely a prominent main pulmonary artery suggesting chronic pulmonary artery hypertension. Increased interstitial prominence bilaterally, bibasilar predominant, LEFT slightly greater than RIGHT, presumably mild edema superimposed on chronic interstitial lung disease. No confluent opacity to suggest a consolidating pneumonia. No pleural effusion or pneumothorax seen. Osseous structures about the chest are unremarkable. IMPRESSION: 1. Probable mild bilateral interstitial edema superimposed on chronic interstitial lung disease. 2. No evidence of consolidating pneumonia or alveolar pulmonary edema. 3. Probable chronic pulmonary artery hypertension. 4. Stable borderline cardiomegaly. Electronically Signed   By: Franki Cabot M.D.   On: 02/11/2019 18:12   Dg Hips Bilat W Or Wo Pelvis 3-4 Views  Result Date: 03/01/2019 CLINICAL DATA:  Bilateral hip pain after fall today. EXAM: DG HIP (WITH OR WITHOUT PELVIS) 3-4V BILAT COMPARISON:  None. FINDINGS: There is no evidence of hip fracture or dislocation. There is no evidence of arthropathy or other focal bone abnormality. IMPRESSION: Negative. Electronically Signed   By: Marijo Conception M.D.   On: 03/01/2019 14:45   Vas US Carotid (at [place] Only)  Result Date: 03/02/2019 Carotid Arterial Duplex Study Indications:       CVA. Risk  Factors:      None. Limitations        Today's exam was limited due to the body habitus of the                    patient, patient movement, patient positioning and the                    patient's respiratory variation. Comparison Study:  No prior studies. Performing Technologist: Oliver Hum RVT  Examination Guidelines: A complete evaluation includes B-mode imaging, spectral Doppler, color Doppler, and power Doppler as needed of all accessible portions of each vessel. Bilateral testing is considered an integral part of a complete examination. Limited examinations for reoccurring indications may be performed as noted.  Right Carotid Findings: +----------+--------+--------+--------+-----------------------+--------+           PSV cm/sEDV cm/sStenosisPlaque Description     Comments +----------+--------+--------+--------+-----------------------+--------+ CCA Prox  68      13              smooth and heterogenous         +----------+--------+--------+--------+-----------------------+--------+ CCA Distal65      11              smooth and heterogenous         +----------+--------+--------+--------+-----------------------+--------+ ICA Prox  58      14              smooth and heterogenoustortuous +----------+--------+--------+--------+-----------------------+--------+ ICA Distal57      22                                     tortuous +----------+--------+--------+--------+-----------------------+--------+ ECA       60      11                                              +----------+--------+--------+--------+-----------------------+--------+ +----------+--------+-------+--------+-------------------+           PSV cm/sEDV cmsDescribeArm Pressure (mmHG) +----------+--------+-------+--------+-------------------+ [id number]                                        +----------+--------+-------+--------+-------------------+ +---------+--------+--+--------+--+---------+  VertebralPSV cm/s73EDV cm/s24Antegrade +---------+--------+--+--------+--+---------+  Left Carotid Findings: +----------+--------+--------+--------+-----------------------+--------+           PSV cm/sEDV cm/sStenosisPlaque Description     Comments +----------+--------+--------+--------+-----------------------+--------+ CCA Prox  101     19              smooth and heterogenous         +----------+--------+--------+--------+-----------------------+--------+ CCA Distal79      16              smooth and heterogenous         +----------+--------+--------+--------+-----------------------+--------+ ICA Prox  80      25              smooth and heterogenoustortuous +----------+--------+--------+--------+-----------------------+--------+ ICA Distal60      29                                     tortuous +----------+--------+--------+--------+-----------------------+--------+ ECA       66                                                      +----------+--------+--------+--------+-----------------------+--------+ +----------+--------+--------+--------+-------------------+           PSV cm/sEDV cm/sDescribeArm Pressure (mmHG) +----------+--------+--------+--------+-------------------+ [id number]                                         +----------+--------+--------+--------+-------------------+ +---------+--------+--+--------+--+---------+ VertebralPSV cm/s72EDV cm/s27Antegrade +---------+--------+--+--------+--+---------+  Summary: Right Carotid: Velocities in the right ICA are consistent with a 1-39% stenosis. Left Carotid: Velocities in the left ICA are consistent with a 1-39% stenosis. Vertebrals: Bilateral vertebral arteries demonstrate antegrade flow. *See table(s) above for measurements and observations.  Electronically signed by Antony Contras MD on 03/02/2019 at 1:16:38 PM.    Final    Vas [name] Lower Extremity Venous (dvt)  Result Date: 03/02/2019  Lower  Venous Study Indications: Pulmonary embolism, and stroke.  Risk Factors: None identified. Limitations: Body habitus, poor ultrasound/tissue interface and patient movement, patient positioning. Comparison Study: No prior studies. Performing Technologist: Oliver Hum RVT  Examination Guidelines: A complete evaluation includes B-mode imaging, spectral Doppler, color Doppler, and power Doppler as needed of all accessible portions of each vessel. Bilateral testing is considered an integral part of a complete examination. Limited examinations for reoccurring indications may be performed as noted.  +---------+---------------+---------+-----------+----------+--------------+ RIGHT    CompressibilityPhasicitySpontaneityPropertiesThrombus Aging +---------+---------------+---------+-----------+----------+--------------+ CFV      Full           Yes      Yes                                 +---------+---------------+---------+-----------+----------+--------------+ SFJ      Full                                                        +---------+---------------+---------+-----------+----------+--------------+ FV Prox  Full                                                        +---------+---------------+---------+-----------+----------+--------------+  FV Mid   Full                                                        +---------+---------------+---------+-----------+----------+--------------+ FV DistalFull                                                        +---------+---------------+---------+-----------+----------+--------------+ PFV      Full                                                        +---------+---------------+---------+-----------+----------+--------------+ POP      Full           Yes      Yes                                 +---------+---------------+---------+-----------+----------+--------------+ PTV      Full                                                         +---------+---------------+---------+-----------+----------+--------------+ PERO     Full                                                        +---------+---------------+---------+-----------+----------+--------------+   +---------+---------------+---------+-----------+----------+--------------+ LEFT     CompressibilityPhasicitySpontaneityPropertiesThrombus Aging +---------+---------------+---------+-----------+----------+--------------+ CFV      Full           Yes      Yes                                 +---------+---------------+---------+-----------+----------+--------------+ SFJ      Full                                                        +---------+---------------+---------+-----------+----------+--------------+ FV Prox  Full                                                        +---------+---------------+---------+-----------+----------+--------------+ FV Mid   Full                                                        +---------+---------------+---------+-----------+----------+--------------+  FV DistalFull                                                        +---------+---------------+---------+-----------+----------+--------------+ PFV      Full                                                        +---------+---------------+---------+-----------+----------+--------------+ POP      Full           Yes      Yes                                 +---------+---------------+---------+-----------+----------+--------------+ PTV      Full                                                        +---------+---------------+---------+-----------+----------+--------------+ PERO     Full                                                        +---------+---------------+---------+-----------+----------+--------------+     Summary: Right: There is no evidence of deep vein thrombosis in the lower extremity. No cystic  structure found in the popliteal fossa. Left: There is no evidence of deep vein thrombosis in the lower extremity. No cystic structure found in the popliteal fossa.  *See table(s) above for measurements and observations. Electronically signed by Servando Snare MD on 03/02/2019 at 3:40:30 PM.    Final    [name] Ekg Site Rite  Result Date: 03/03/2019 If Site Rite image not attached, placement could not be confirmed due to current cardiac rhythm.    PHYSICAL EXAM    Temp:  [97.6 F (36.4 C)-98.7 F (37.1 C)] 97.6 F (36.4 C) (09/07 0334) Pulse Rate:  [85-95] 85 (09/07 0334) Resp:  [14-20] 14 (09/07 0334) BP: (131-163)/(79-97) 145/82 (09/07 0334) SpO2:  [92 %-100 %] 95 % (09/07 0334)  General - Well nourished, well developed, drowsy sleepy.  Ophthalmologic - fundi not visualized due to noncooperation.  Cardiovascular - Regular rate and rhythm.  Neuro - drowsy sleepy, did not answer orientation questions.  Not cooperative on exam.  Mild dysarthria.  Not following commands.  PERRLA, attending to both sides, inconsistent blinking to visual threat bilaterally.  No significance facial droop.  Moving all extremities symmetrically by observation. Sensation, coordination and gait not tested.   ASSESSMENT/PLAN [name] is a 73 y.o. female with history of non-small cell lung cancer s/p radiation therapy, HIV, Hep B, COPD, HLD, CKD stg 3, and legally blind who presented with fall, generalized weakness, and chest pain, found to have embolic infarcts in R MCA territory with subacute hemorrhagic component along with PE and prominent thrombus in aortic arch.  Stroke: Acute R MCA territory infarct including  right temporal, insular cortex, BG infarcts, with hemorrhagic conversion at BG, embolic patter concerning for PFO in the setting of especially in setting of PE   CT head - Acute right MCA territory ischemic infarct. 10 mm hemorrhage in the right basal ganglia. No midline shift or  hydrocephalus.  MRI with and without contrast pending to evaluate for stroke and rule out brain mass, PML or CNS infection in the setting of hx of cancer and HIV  CTA head and neck right M3 occlusion, again seen aortic arch thrombus  2D Echo EF > 65%, moderate pulmonary HTN, no PFO seen  Carotid Doppler unremarkable  LE venous Doppler negative for DVT  TEE pending  LDL 48  HgbA1c 6.4   Hypercoagulable work-up - 03/01/19 - so far negative (protein S low 52), but some still pending  UDS - 03/04/19 - negative  IV heparin for VTE ppx  ASA 81 prior to admission, now on heparin IV due to PE, aortic arch thrombus and embolism. CT showed hemorrhagic conversion evolving, will switch heparin IV to po eliquis.  Therapy recommendations:  SNF  Disposition: pending  Embolism - Pulmonary, Aortic  CT chest showed primary emboli and emboli in aortic arch  CT abdomen pelvis showed right renal infarcts as well as splenic infarct.  No cancer  Right MCA territory infarct  LE venous Doppler negative for DVT  Concerning for PFO related embolism in the setting of PE  TEE  to rule out PFO and endocarditis in the pt with hx of HIV and substance abuse once stable   Acute hypercapnic respiratory failure  rapid response was called 9/4 early am for sudden mental status change  BP in 50s with agonal breathing  ABG PCO2 80  History of COPD  Treated with BiPAP, much improved  Management as per primary team  HIV  CD4 count pending  Viral load 30  CD4 on 02/13/2019 was 406  Low suspicious for PML given low viral load and no significant cerebral edema on CT  Substance abuse  As per family, 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 Pt agitation and restless and body aches ? 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 (also has prn Haldol available)  Pain - Oxycodone, gabapentin and Robaxin per [facility] day #5  Rosalin Hawking, MD PhD Stroke Neurology 03/06/2019 2:12 PM      To contact Stroke Continuity provider, please refer to [email or web address] After hours, contact General Neurology

## 2019-03-06 NOTE — Progress Notes (Signed)
Physical Therapy Treatment Patient Details Name: [name] MRN: [id number] DOB: Nov 01, 1945 Today's Date: 03/06/2019    History of Present Illness 73yo F dmitted 9/2 after a fall with generalized weakness and CP.  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 As of 9/4, pt with AMS CT H is evolving.PMH HIV, adenocarcinoma of LUL s/p radiation therapy, HTN, HLD, COPD, chronic respiratory failure on 4 L/min oxygen at baseline, asthma, HBV, tobacco abuse, CKD III.    PT Comments    PT session initiated to focus on functional mobility but pt lying in bed asleep. Therapist repositioned pt in bed, doffed & redonned SCD's, placed cold cloth on head but pt did not wake up. Therapist encouraged pt to remove cold wet cloth from forehead but pt only grunts in response. Pt limited by fatigue & RN made aware. Pt left in bed with LUE wrist restraint donned (was donned upon PT arrival). Current d/c recommendation of SNF remains appropriate as pt's participation in therapy is limited & on previous date pt required +2 assist for mobility.     Follow Up Recommendations  SNF;Supervision/Assistance - 24 hour     Equipment Recommendations       Recommendations for Other Services       Precautions / Restrictions Precautions Precautions: Fall;Other (comment) Precaution Comments: watch O2 Restrictions Weight Bearing Restrictions: No    Mobility  Bed Mobility                  Transfers                    Ambulation/Gait                 Stairs             Wheelchair Mobility    Modified Rankin (Stroke Patients Only)       Balance                                            Cognition Arousal/Alertness: Lethargic   Overall Cognitive Status: Difficult to assess                                        Exercises      General Comments         Pertinent Vitals/Pain Pain Assessment: Faces Faces Pain Scale: No hurt    Home Living                      Prior Function            PT Goals (current goals can now be found in the care plan section) Acute Rehab PT Goals Patient Stated Goal: unable to state PT Goal Formulation: Patient unable to participate in goal setting    Frequency    Min 3X/week      PT Plan Current plan remains appropriate    Co-evaluation              AM-PAC PT "6 Clicks" Mobility   Outcome Measure  Help needed turning from your back to your side while in a flat bed without using bedrails?: A Lot Help needed moving from lying on your  back to sitting on the side of a flat bed without using bedrails?: A Lot Help needed moving to and from a bed to a chair (including a wheelchair)?: A Lot Help needed standing up from a chair using your arms (e.g., wheelchair or bedside chair)?: A Lot   Help needed climbing 3-5 steps with a railing? : Total 6 Click Score: 9    End of Session Equipment Utilized During Treatment: Oxygen Activity Tolerance: Patient limited by fatigue Patient left: in bed;with SCD's reapplied;with bed alarm set;with call bell/phone within reach Nurse Communication: Mobility status PT Visit Diagnosis: Unsteadiness on feet (R26.81);Other symptoms and signs involving the nervous system (R29.898);Other abnormalities of gait and mobility (R26.89)     Time: 5697-9480 PT Time Calculation (min) (ACUTE ONLY): 15 min  Charges:  $Therapeutic Activity: 8-22 mins                         Waunita Schooner, PT, DPT 03/06/2019, 1:58 PM

## 2019-03-06 NOTE — Progress Notes (Signed)
ANTICOAGULATION CONSULT NOTE - Initial Consult  Pharmacy Consult for apixaban  Indication: pulmonary embolus  No Known Allergies  Patient Measurements: Height: 5\' 9"  (175.3 cm) Weight: 222 lb 0.1 oz (100.7 kg) IBW/kg (Calculated) : 66.2  Vital Signs:    Labs: Recent Labs    03/04/19 0332  03/05/19 0523 03/05/19 0751 03/05/19 1630 03/06/19 0412 03/06/19 1122  HGB 13.3  --  13.0  --   --  15.9*  --   HCT 50.0*  --  47.1*  --   --  57.7*  --   PLT 115*  --  128*  --   --  121*  --   HEPARINUNFRC  --    < >  --  0.44 0.27*  --  0.51  CREATININE 0.85  --   --   --   --   --   --    < > = values in this interval not displayed.    Estimated Creatinine Clearance: 76.7 mL/min (by C-G formula based on SCr of 0.85 mg/dL).   Medical History: Past Medical History:  Diagnosis Date  . Asthma   . Atypical squamous cells of undetermined significance (ASCUS) on Papanicolaou smear of cervix   . COPD (chronic obstructive pulmonary disease) ([place])   . H/O drainage of abscess    left axilla, from left breast  . Hep B w/o coma   . HIV (human immunodeficiency virus infection) ([place])   . Hyperlipidemia   . Lung cancer ([place]) 12/14/13   LUL Adenocarcinoma  . Neuromuscular disorder ([place])    fingers/feet neuropathy  . Non-small cell lung cancer ([place])   . S/P radiation therapy 01/26/14-02/02/14   sbrt  lt upper lung-54Gy/12fx    Medications:  Scheduled:  .  stroke: mapping our early stages of recovery book   Does not apply Once  . acetaminophen  650 mg Oral Q8H  . apixaban  10 mg Oral BID   Followed by  . [START ON 03/13/2019] apixaban  5 mg Oral BID  . calcium-vitamin D  1 tablet Oral Q breakfast  . DULoxetine  20 mg Oral Daily  . efavirenz-emtricitabine-tenofovir  1 tablet Oral QHS  . gabapentin  600 mg Oral TID  . mometasone-formoterol  2 puff Inhalation BID  . nicotine  21 mg Transdermal Daily  . OLANZapine  2.5 mg Oral QHS  . omega-3 acid ethyl esters  1 g Oral Daily  . sodium  chloride flush  10-40 mL Intracatheter Q12H  . umeclidinium bromide  2 puff Inhalation Daily    Assessment: 73 yr old female presented S/P fall (slid to floor from bed), hip pain. 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 Pharmacy consulted to transition patient from IV heparin to apixaban. Hgb 15.9. Plt 121. No new bleeding noted.   Goal of Therapy:  Therapeutic anticoagulation  Monitor platelets by anticoagulation protocol: Yes   Plan:  - Discontinue heparin - Start Eliquis 10 mg BID x7 days then switch to 5 mg BID for treatment of PE - Monitor CBC, renal function, and signs and symptoms of bleeding  Agnes Lawrence, PharmD PGY1 Pharmacy Resident

## 2019-03-06 NOTE — Progress Notes (Signed)
Daily Progress Note   Patient Name: [name]       Date: 03/06/2019 DOB: March 31, 1946  Age: 73 y.o. MRN#: [id number] Attending Physician: Debbe Odea, MD Primary Care Physician: System, Provider Not In Admit Date: 03/01/2019  Reason for Consultation/Follow-up: Establishing goals of care, Non pain symptom management, Pain control and Psychosocial/spiritual support  Subjective: Pt sleeping and does not wake to my voice or touch on the shoulder.  She appears comfortable. I spoke with her bedside RN.  She has only received tylenol today (no opioids) she wakes and takes her medications.    Patient Profile/HPI: 72 y.o. female  with past medical history of LUL NSCL CA s/p radiation (2015), COPD, CKD 3, and HIV who was admitted on 03/01/2019 after a fall with weakness and chest pain.  She was found to have multiple clots causing a PE, right MCA CVA, renal infarct and splenic infarct. 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  Currently the patient is hypercapnic and on bipap.     Length of Stay: 5  Current Medications: Scheduled Meds:  .  stroke: mapping our early stages of recovery book   Does not apply Once  . acetaminophen  650 mg Oral Q8H  . calcium-vitamin D  1 tablet Oral Q breakfast  . DULoxetine  20 mg Oral Daily  . efavirenz-emtricitabine-tenofovir  1 tablet Oral QHS  . gabapentin  600 mg Oral TID  . mometasone-formoterol  2 puff Inhalation BID  . nicotine  21 mg Transdermal Daily  . OLANZapine  2.5 mg Oral QHS  . omega-3 acid ethyl esters  1 g Oral Daily  . sodium chloride flush  10-40 mL Intracatheter Q12H  . umeclidinium bromide  2 puff Inhalation Daily    Continuous Infusions: . heparin 1,950 Units/hr (03/05/19 1833)    PRN Meds: acetaminophen **OR**  acetaminophen, albuterol, haloperidol lactate, methocarbamol, naLOXone (NARCAN)  injection, ondansetron **OR** ondansetron (ZOFRAN) IV, oxyCODONE, sodium chloride flush  Physical Exam        Well developed female does not wake to speak with me but does move her leg spontaneously Does not appear to be in any distress.  Right wrist restraint has been removed allowing her to sleep on her left side.  Vital Signs: BP (!) 145/82 (BP Location: Left Arm)  Pulse 85   Temp 97.6 F (36.4 C) (Oral)   Resp 14   Ht 5\' 9"  (1.753 m)   Wt 100.7 kg   SpO2 95%   BMI 32.78 kg/m  SpO2: SpO2: 95 % O2 Device: O2 Device: Nasal Cannula O2 Flow Rate: O2 Flow Rate (L/min): 2 L/min  Intake/output summary:   Intake/Output Summary (Last 24 hours) at 03/06/2019 1258 Last data filed at 03/06/2019 0809 Gross per 24 hour  Intake 80 ml  Output 1400 ml  Net -1320 ml   LBM: Last BM Date: 03/05/19 Baseline Weight: Weight: 100.7 kg Most recent weight: Weight: 100.7 kg       Palliative Assessment/Data:  40      Patient Active Problem List   Diagnosis Date Noted  . DNR (do not resuscitate)   . Altered mental status   . Acute pulmonary embolism without acute cor pulmonale (HCC)   . Acute ischemic right MCA stroke ([place])   . Palliative care encounter   . Aortic embolism or thrombosis ([place])   . CVA (cerebral vascular accident) ([place]) 03/02/2019  . COPD (chronic obstructive pulmonary disease) ([place]) 03/01/2019  . PE (pulmonary thromboembolism) ([place]) 03/01/2019  . Tobacco abuse 03/01/2019  . 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 CKD (chronic kidney disease), stage III ([place]) 03/01/2019  . Generalized weakness 03/01/2019  . Fall at home   . 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([place])   . Splenic infarct   . Acute and chronic respiratory failure (acute-on-chronic) ([place]) 02/11/2019  . Sepsis ([place]) 03/28/2015  . CAP (community acquired pneumonia) 03/28/2015  . UTI (lower urinary tract infection) 03/28/2015  . Respiratory failure ([place])  03/28/2015  . Non-small cell lung cancer ([place])   . Malignant neoplasm of upper lobe, bronchus or lung 01/03/2014  . Acute hypoxemic respiratory failure ([place]) 10/31/2013  . COPD with acute exacerbation ([place]) 10/30/2013  . COPD exacerbation ([place]) 10/30/2013  . Other and unspecified noninfectious gastroenteritis and colitis(558.9) 11/20/2012  . Rectal bleed 11/19/2012  . Obesity 05/05/2012  . Renal insufficiency 05/05/2012  . Hyperglycemia 05/05/2012  . Asthma 12/10/2011  . Cigarette smoker 12/10/2011  . Hyperlipidemia 12/11/2010  . Hypertension 12/11/2010  . ALLERGIC RHINITIS, SEASONAL 07/03/2010  . COPD 07/03/2010  . Human immunodeficiency virus (HIV) disease ([place]) 12/09/2006  . DEPRESSION 12/09/2006  . BREAST MASS, BENIGN 12/09/2006  . OSTEOPOROSIS 12/09/2006    Palliative Care Plan    Recommendations/Plan:  Continue with limited current pain medications (low dose oxy PRN, robaxin, gabapentin).  Continue with low dose Zyprexa for agitation.  Will not up titrate on this evaluation.  Would consider reducing dose if she remains overly sleepy.  Anticipate discharge to SNF [facility] family's hope) with medically appropriate.  She may benefit from heme-onc follow up post discharge pending hypercoagulable work up.  Please write "Palliative Care to follow at SNF" in DC instructions.  PMT inpatient will follow at a distance (Chart check).  Please call [phone] if we are needed more urgently.  Thanks.  Goals of Care and Additional Recommendations:  Limitations on Scope of Treatment: Full Scope Treatment  Code Status:  DNR (If she arrests)  Prognosis:   Unable to determine.  She is at high risk for acute decompensation.   Discharge Planning:  [place] for rehab with Palliative care service follow-up  Thank you for allowing the Palliative Medicine Team to assist in the care of this patient.  Total time spent:  35 min.     Greater than 50%  of  this time was spent counseling and coordinating care related to the above assessment and plan.  Florentina Jenny, PA-C Palliative Medicine  Please contact Palliative MedicineTeam phone at [phone] for questions and concerns between 7 am - 7 pm.   Please see AMION for individual provider pager numbers.

## 2019-03-06 NOTE — Progress Notes (Signed)
ANTICOAGULATION CONSULT NOTE  Pharmacy Consult:  Heparin Indication: pulmonary embolus + aortic thrombus  No Known Allergies  Patient Measurements: Height: 5\' 9"  (175.3 cm) Weight: 222 lb 0.1 oz (100.7 kg) IBW/kg (Calculated) : 66.2 Heparin Dosing Weight: 88 kg  Vital Signs: Temp: 97.6 F (36.4 C) (09/07 0334) Temp Source: Oral (09/07 0334) BP: 145/82 (09/07 0334) Pulse Rate: 85 (09/07 0334)  Labs: Recent Labs    03/03/19 1625  03/04/19 0332  03/05/19 0523 03/05/19 0751 03/05/19 1630 03/06/19 0412 03/06/19 1122  HGB  --    < > 13.3  --  13.0  --   --  15.9*  --   HCT  --   --  50.0*  --  47.1*  --   --  57.7*  --   PLT  --   --  115*  --  128*  --   --  121*  --   HEPARINUNFRC 0.18*   < >  --    < >  --  0.44 0.27*  --  0.51  CREATININE 0.86  --  0.85  --   --   --   --   --   --    < > = values in this interval not displayed.    Estimated Creatinine Clearance: 76.7 mL/min (by C-G formula based on SCr of 0.85 mg/dL).   Assessment: 73 yr old female presented S/P fall (slid to floor from bed), hip pain. 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 Pharmacy consulted to dose heparin for PE.  Heparin was turned off 03/02/19 PM due to concern for stroke with hemorrhagic conversion. Neurology believes hemorrhagic component is older and have ordered to restart IV heparin.  On 03/03/19, patient had AMS with hypotension, thought due to narcotic since patient responded to Narcan and IVF.  Heparin was turned off again while awaiting repeat head CT 9/4. CT is stable with unchanged hemorrhage. Dr. [name] was okay to resume IV heparin.   Heparin level is acceptable - it has been fluctuating with minimal heparin rate adjustments.  No bleeding reported.  Goal of Therapy:  Heparin level 0.3 - 0.5 units/ml Monitor platelets by anticoagulation protocol: Yes   Plan:  Continue heparin gtt at 1950 units/hr Daily heparin level and CBC  Yazan Gatling D. Mina Marble, PharmD,  BCPS, [place] 03/06/2019, 12:38 PM

## 2019-03-06 NOTE — Plan of Care (Signed)
Patient stable, discussed POC with patient and sister Deloris, agreeable with plan, denies question/concerns at this time.

## 2019-03-06 NOTE — Progress Notes (Signed)
SLP Cancellation Note  Patient Details Name: [name] MRN: [id number] DOB: 1946/04/23   Cancelled treatment:       Reason Eval/Treat Not Completed: Fatigue/lethargy limiting ability to participate;Patient declined, no reason specified; SLP attempted x2 with pt "swatting" at therapist with second attempt despite max cues.   Elvina Sidle, M.S., [place] 03/06/2019, 2:04 PM

## 2019-03-06 NOTE — Progress Notes (Signed)
PROGRESS NOTE    [name]   PJK:[id number]  DOB: 1946-04-03  DOA: 03/01/2019 PCP: System, Provider Not In   Brief Narrative:  [name] is a 73 y.o. 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 Patient had noted generalized weakness and leg pain for a number of days and then fell out of the bed around 6 AM and laid on the floor until her son found her about 6 hours later.  She was too weak to ambulate.  She noted right-sided lower rib pain after the fall.  The patient underwent a CT of the chest abdomen pelvis in the ED.  Results showed pulmonary emboli and emboli in the aortic arch with a right renal infarcts and a splenic infarct.  Also noted is a fatty liver and emphysema. She was started on a heparin infusion.  She subsequently had a CT scan of her head which was ordered by the admitting doctor and showed acute right MCA territory ischemic infarct. 10 mm hemorrhage in the right basal ganglia  She was recently admitted from 8/15-8/21 for acute respiratory failure due to COPD and overmedications and her Robaxin, Gabapentin and Seroquel. She required a BiPAP. She was discharged home on 4 L O2 and a CPAP.  Subjective: She was sleeping deeply when evaluated this morning.  Per RN she has awoken subsequently and has taken her medications.    Assessment & Plan:   Principal Problem: Acute hypercapnic respiratory failure - may be due to narcotics, progression of CVA in setting of PEs or COPD flare - needed > 12 hrs of BiPAP but improved -   added low dose solumedrol  - will begin to wean steroids -9/6 >> weaned her off of O2 this AM- pulse ox is 96%  CVA (cerebral vascular accident)  - left facial droop, mild weakness on left side with subjective decreased sensation -  CVA may be embolic due to aortic thrombus? Neuro recommended Heparin infusion - Her 2D echo does not show a PFO - will need SNF based on PT/OT evals - passed swallow eval -CTA: 1 Abrupt proximal right M3 occlusion as above, consistent with evolving subacute right MCA territory infarcts. 2. 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 Finding could serve as an embolic source. 3. 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 4. Mild to moderate scattered atheromatous change elsewhere about the major arterial vasculature of the head and neck. No other hemodynamically significant or correctable stenosis identified. - Hypercoagulable work up ordered - Cardiolipin neg, Pro C normal, B2 glycoprotein neg, Homocysteine normal, Antithrombin III normal- still awaiting the rest    PE (pulmonary thromboembolism) -acute respiratory failure - on 9/3, in ED,  patient was quite hypoxic and requiring 4 L of oxygen - Bilateral lower extremity venous duplex is negative for DVT - cont Heparin infusion    Aortic thrombus  - cont Heparin infusion  Acute thrombocytopenia - Platelets are around 120s today-continue to follow-  may be secondary to extensive thrombosis   DM 2 - Hb A1c 6.4-not checking CBCs at this moment    Human immunodeficiency virus (HIV) disease  -Last CD4 count was 406 on 02/13/2019 -Continue Atripla  Neuropathy in legs -  was non stop complaining of pain in legs after admitted - now holding gabapentin and all pain medications due to her episode of hypercapnia and also as she is NPO     Cigarette smoker - COPD (chronic obstructive pulmonary disease)   - advised to stop smoking     Non-small cell lung cancer   - s/p radiation    CKD (chronic kidney disease), stage II- III    - stable  + UA/ urine culture > 100K  Aerococcus - she has not complained of dysuria- follow off of antibitoics  Time spent in minutes: 35 DVT prophylaxis: SCDs Code Status: DNR per family Family Communication: discussed plan in detail with brother Anaid Haney Disposition Plan: cont to follow in hospital- appreciate palliative care consult- family wants SNF Consultants:   Neuro Procedures:   2D echo 1. The left ventricle has hyperdynamic systolic function, with an ejection fraction of >65%. The cavity size was normal. There is mildly increased left ventricular wall thickness. Left ventricular diastolic Doppler parameters are consistent with impaired relaxation. 2. The right ventricle has normal systolic function. The cavity was normal. 3. The mitral valve is abnormal. Mild thickening of the mitral valve leaflet. 4. The tricuspid valve is grossly normal. 5. The aortic valve is tricuspid. Mild calcification of the aortic valve. No stenosis of the aortic valve. 6. The aorta is normal unless otherwise noted. 7. Vigorous LV systolic function; mild LVH; grade 1 diastolic dysfunction; trace TR; moderate pulmonary hypertension Antimicrobials:  Anti-infectives (From admission, onward)   Start     Dose/Rate Route Frequency Ordered Stop   03/05/19 2200  efavirenz-emtricitabine-tenofovir (ATRIPLA) 600-200-300 MG per tablet 1 tablet     1 tablet Oral Daily at bedtime 03/05/19 1003     03/01/19 2215  efavirenz-emtricitabine-tenofovir (ATRIPLA) 600-200-300 MG per tablet 1 tablet  Status:  Discontinued     1 tablet Oral Daily at bedtime 03/01/19 2206 03/03/19 1641       Objective: Vitals:   03/05/19 1534 03/05/19 1940 03/05/19 2326 03/06/19 0334  BP: (!) 163/97 (!) 161/87 131/79 (!) 145/82  Pulse: 95 95 86 85  Resp: 20 18 19 14   Temp: 98.1 F (36.7 C) 98.2 F (36.8 C) 98.7 F (37.1 C) 97.6 F (36.4 C)  TempSrc: Oral Oral Oral Oral  SpO2: 92% 100% 95% 95%  Weight:      Height:        Intake/Output Summary (Last  24 hours) at 03/06/2019 1129 Last data filed at 03/06/2019 0809 Gross per 24 hour  Intake 80 ml  Output 1400 ml  Net -1320 ml   Filed Weights   03/01/19 2000  Weight: 100.7 kg    Examination: General exam: Appears comfortable  HEENT: PERRLA, oral mucosa moist, no sclera icterus or thrush Respiratory system: Clear to auscultation. Respiratory effort normal. Cardiovascular system: S1 & S2 heard,  No murmurs  Gastrointestinal system: Abdomen soft, non-tender, nondistended. Normal bowel sounds   Central nervous system: Alert and oriented. No focal neurological deficits. Extremities: No cyanosis, clubbing or edema Skin: No rashes or ulcers Psychiatry:  Mood & affect appropriate.   Data Reviewed: I have personally reviewed following labs and imaging studies  CBC: Recent Labs  Lab 03/01/19 1330 03/02/19 0209 03/02/19 0525 03/04/19 0332 03/05/19 0523 03/06/19 0412  WBC 11.6* 10.3 9.6 6.9 7.4 6.2  NEUTROABS 8.0*  --   --   --   --   --   HGB 15.1* 14.2 14.4 13.3 13.0 15.9*  HCT 55.0* 53.9* 53.7* 50.0* 47.1* 57.7*  MCV 86.2 88.8 88.5 89.1 85.6 84.9  PLT 149* 142* 137* 115* 128* 465*   Basic Metabolic Panel: Recent Labs  Lab 03/01/19 1330 03/02/19 0525 03/03/19 1625 03/04/19 0332  NA 140 137 142 140  K 5.0 3.5 3.9 4.2  CL 102 97* 105 104  CO2 23 26 29 25   GLUCOSE 89 80 89 96  BUN 13 10 8 8   CREATININE 1.31* 1.04* 0.86 0.85  CALCIUM 8.6* 7.9* 8.2* 8.2*   GFR: Estimated Creatinine Clearance: 76.7 mL/min (by C-G formula based on SCr of 0.85 mg/dL). Liver Function Tests: Recent Labs  Lab 03/01/19 1330  AST 73*  ALT 25  ALKPHOS 66  BILITOT 2.0*  PROT 7.6  ALBUMIN 3.2*   No results for input(s): LIPASE, AMYLASE in the last 168 hours. No results for input(s): AMMONIA in the last 168 hours. Coagulation Profile: No results for input(s): INR, PROTIME in the last 168 hours. Cardiac Enzymes: Recent Labs  Lab 03/01/19 1330  CKTOTAL 382*   BNP (last 3 results) No  results for input(s): PROBNP in the last 8760 hours. HbA1C: No results for input(s): HGBA1C in the last 72 hours. CBG: Recent Labs  Lab 03/02/19 0644 03/03/19 0358 03/03/19 0546  GLUCAP 102* 104* 98   Lipid Profile: No results for input(s): CHOL, HDL, LDLCALC, TRIG, CHOLHDL, LDLDIRECT in the last 72 hours. Thyroid Function Tests: No results for input(s): TSH, T4TOTAL, FREET4, T3FREE, THYROIDAB in the last 72 hours. Anemia Panel: No results for input(s): VITAMINB12, FOLATE, FERRITIN, TIBC, IRON, RETICCTPCT in the last 72 hours. Urine analysis:    Component Value Date/Time   COLORURINE AMBER (A) 03/01/2019 1721   APPEARANCEUR CLOUDY (A) 03/01/2019 1721   LABSPEC 1.023 03/01/2019 1721   PHURINE 5.0 03/01/2019 1721   GLUCOSEU NEGATIVE 03/01/2019 1721   HGBUR NEGATIVE 03/01/2019 1721   BILIRUBINUR NEGATIVE 03/01/2019 1721   KETONESUR 80 (A) 03/01/2019 1721   PROTEINUR 100 (A) 03/01/2019 1721   UROBILINOGEN 0.2 03/28/2015 1227   NITRITE NEGATIVE 03/01/2019 1721   LEUKOCYTESUR NEGATIVE 03/01/2019 1721   Sepsis Labs: @LABRCNTIP (procalcitonin:4,lacticidven:4) ) Recent Results (from the past 240 hour(s))  Urine culture     Status: Abnormal   Collection Time: 03/01/19  5:10 PM   Specimen: Urine, Random  Result Value Ref Range Status   Specimen Description URINE, RANDOM  Final   Special Requests NONE  Final   Culture (A)  Final    >=100,000 COLONIES/mL AEROCOCCUS SPECIES Standardized susceptibility testing for this organism is not available. Performed at [facility], [place] [address] [place], [place] [address]    Report Status 03/03/2019 FINAL  Final  SARS CORONAVIRUS 2 (TAT 6-24 HRS) Nasopharyngeal Nasopharyngeal Swab     Status: None   Collection Time: 03/01/19 10:10 PM   Specimen: Nasopharyngeal Swab  Result Value Ref Range Status   SARS Coronavirus 2 NEGATIVE NEGATIVE Final    Comment: (NOTE) SARS-CoV-2 target nucleic acids are NOT DETECTED. The SARS-CoV-2 RNA is  generally detectable in upper and lower respiratory specimens during the acute phase of infection. Negative results do not preclude SARS-CoV-2 infection, do not rule out co-infections with other pathogens, and should not be used as the sole basis for treatment or other patient management decisions. Negative results must be combined with clinical observations, patient history, and epidemiological information. The expected result is Negative. Fact Sheet for Patients: SugarRoll.be Fact Sheet for Healthcare Providers: https://www.woods-mathews.com/ This test is not yet approved or  cleared by the [place] and  has been authorized for detection and/or diagnosis of SARS-CoV-2 by FDA under an Emergency Use Authorization (EUA). This EUA will remain  in effect (meaning this test can be used) for the duration of the COVID-19 declaration under Section 56 4(b)(1) of the Act, 21 U.S.C. section 360bbb-3(b)(1), unless the authorization is terminated or revoked sooner. Performed at [facility], [place] [address] [place], [place] [address]   Culture, blood (Routine X 2) w Reflex to ID Panel     Status: None (Preliminary result)   Collection Time: 03/02/19  2:09 AM   Specimen: BLOOD RIGHT FOREARM  Result Value Ref Range Status   Specimen Description BLOOD RIGHT FOREARM  Final   Special Requests   Final    BOTTLES DRAWN AEROBIC AND ANAEROBIC Blood Culture results may not be optimal due to an inadequate volume of blood received in culture bottles   Culture   Final    NO GROWTH 4 DAYS Performed at [facility], [place] [address] [place], [place] [address]    Report Status PENDING  Incomplete  Culture, blood (Routine X 2) w Reflex to ID Panel     Status: None (Preliminary result)   Collection Time: 03/02/19  5:25 AM   Specimen: BLOOD  Result Value Ref Range Status   Specimen Description BLOOD RIGHT ARM  Final   Special Requests   Final     BOTTLES DRAWN AEROBIC AND ANAEROBIC Blood Culture adequate volume   Culture   Final    NO GROWTH 4 DAYS Performed at [facility], [place] [address] [place], [place] [address]    Report Status PENDING  Incomplete         Radiology Studies: Ct Angio Head W Or Wo Contrast  Result Date: 03/06/2019 CLINICAL DATA:  Follow-up examination for acute stroke. EXAM: CT ANGIOGRAPHY HEAD AND NECK TECHNIQUE: Multidetector CT imaging of the head and neck was performed using the standard protocol during bolus administration of intravenous contrast. Multiplanar CT image reconstructions and MIPs were obtained to evaluate the vascular anatomy. Carotid stenosis measurements (when applicable) are obtained utilizing NASCET criteria, using the distal internal carotid diameter as the denominator. CONTRAST:  69mL OMNIPAQUE IOHEXOL 350 MG/ML SOLN COMPARISON:  Prior CT from 03/03/2019. FINDINGS: CT HEAD FINDINGS Brain: Evolving subacute predominantly right MCA territory infarct involving the right parietotemporal region again seen, relatively stable in size and distribution from previous. Probable associated faint petechial hemorrhage without hemorrhagic transformation or significant mass effect. 10 mm subacute intraparenchymal hemorrhage at the right lentiform nucleus again seen as well, relatively similar in size, but with decreased attenuation as compared to previous. Mild localized edema without significant regional mass effect. No other new acute intracranial hemorrhage. No acute large vessel territory infarct. 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 No mass lesion or midline shift. No hydrocephalus. No extra-axial fluid collection. Vascular: No hyperdense vessel. Scattered calcified atherosclerosis at the skull base. Skull: Scalp soft tissues and calvarium  within normal limits. Sinuses: Trace layering opacity noted within the left sphenoid sinus. Paranasal sinuses are otherwise clear. No mastoid effusion. Orbits: Globes orbital soft tissues demonstrate no acute finding. Remote posttraumatic defect noted at the left orbital floor. Review of the MIP images confirms the above findings CTA NECK FINDINGS Aortic arch: Previously identified intraluminal thrombus  protruding into the aortic arch again seen, grossly similar to prior chest CT (series 13, image 323, 328). Moderate atherosclerotic irregularity throughout the visualized arch itself. No hemodynamically significant stenosis seen about the origin of the great vessels. Visualized subclavian arteries widely patent. Right carotid system: Right CCA patent from its origin to the bifurcation without stenosis. Medial is a shin of the right common carotid artery into the retropharyngeal space. Atheromatous irregularity about the right bifurcation/proximal right ICA with associated stenosis of up to 30% by NASCET criteria. Right ICA tortuous but otherwise patent to the skull base without stenosis, dissection or occlusion. Left carotid system: Small amount of intraluminal thrombus seen protruding into the left common carotid artery at its origin (series 13, image 309). Left CCA patent from its origin to the bifurcation without flow-limiting stenosis. Medialization of the left common carotid artery into the retropharyngeal space. Mild atheromatous irregularity about the left carotid bifurcation without hemodynamically significant stenosis (no more than 25%). Left ICA tortuous but otherwise widely patent to the skull base without stenosis, dissection, or occlusion. Vertebral arteries: Both vertebral arteries arise from the subclavian arteries. Vertebral arteries mildly tortuous but widely patent within the neck without stenosis, dissection, or occlusion. Skeleton: No acute osseous abnormality. No discrete osseous lesions.  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 Patient is edentulous. Other neck: No other acute soft tissue abnormality within the neck. Approximate 19 mm left thyroid nodule noted, with additional 1 cm adjacent left thyroid nodule. Right thyroid gland hypoplastic. Upper chest: Visualized upper chest demonstrates no other acute finding. Emphysematous changes noted. Review of the MIP images confirms the above findings CTA HEAD FINDINGS Anterior circulation: Petrous segments widely patent bilaterally. Scattered atheromatous irregularity within the cavernous/supraclinoid ICAs without hemodynamically significant stenosis. ICA termini well perfused. A1 segments widely patent. Normal anterior communicating artery. Anterior cerebral arteries patent to their distal aspects without stenosis. Right M1 widely patent. Normal right MCA bifurcation. There is apparent abrupt occlusion of a proximal right M3 branch at its origin, best seen on coronal reformatted images (series 14, image 112). Finding consistent with the evolving subacute right MCA territory infarct. Distal right MCA branches otherwise well perfused. Left M1 widely patent. Normal left MCA bifurcation. Distal left MCA branches well perfused. Posterior circulation: Vertebral arteries patent to the vertebrobasilar junction without stenosis. Left vertebral artery dominant. Posterior inferior cerebral arteries patent bilaterally. Basilar patent to its distal aspect without stenosis. Superior cerebral arteries patent bilaterally. Both of the posterior cerebral arteries primarily supplied via the basilar and are well perfused their distal aspects. Venous sinuses: Patent. Anatomic variants: None significant. Review of the MIP images confirms the above findings IMPRESSION: CT HEAD IMPRESSION: 1. Normal expected interval evolution of subacute right MCA territory infarcts, stable in size and distribution as compared to previous exams. 2. Normal expected  interval evolution of 10 mm right basal ganglia hemorrhage, likely subacute right MCA territory infarct with hemorrhagic transformation. No evidence for interval re-bleeding or significant mass effect. 3. Focal area of confluent hypodensity involving the high parasagittal right frontal lobe, stable from previous. Finding is indeterminate, and could reflect sequelae of chronic ischemia or other insult. 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 CTA HEAD AND NECK IMPRESSION: 1. 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 2. Scattered areas of intraluminal thrombus involving the aortic arch and origin of the left common carotid artery as above, relatively  similar to prior chest CT from 03/01/2019. Finding could serve as an embolic source. 3. 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 4. Mild to moderate scattered atheromatous change elsewhere about the major arterial vasculature of the head and neck. No other hemodynamically significant or correctable stenosis identified. Electronically Signed   By: Jeannine Boga M.D.   On: 03/06/2019 03:59   Ct Angio Neck W Or Wo Contrast  Result Date: 03/06/2019 CLINICAL DATA:  Follow-up examination for acute stroke. EXAM: CT ANGIOGRAPHY HEAD AND NECK TECHNIQUE: Multidetector CT imaging of the head and neck was performed using the standard protocol during bolus administration of intravenous contrast. Multiplanar CT image reconstructions and MIPs were obtained to evaluate the vascular anatomy. Carotid stenosis measurements (when applicable) are obtained utilizing NASCET criteria, using the distal internal carotid diameter as the denominator. CONTRAST:  93mL OMNIPAQUE IOHEXOL 350 MG/ML SOLN COMPARISON:  Prior CT from 03/03/2019. FINDINGS: CT HEAD FINDINGS Brain: Evolving subacute predominantly right MCA  territory infarct involving the right parietotemporal region again seen, relatively stable in size and distribution from previous. Probable associated faint petechial hemorrhage without hemorrhagic transformation or significant mass effect. 10 mm subacute intraparenchymal hemorrhage at the right lentiform nucleus again seen as well, relatively similar in size, but with decreased attenuation as compared to previous. Mild localized edema without significant regional mass effect. No other new acute intracranial hemorrhage. No acute large vessel territory infarct. 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 No mass lesion or midline shift. No hydrocephalus. No extra-axial fluid collection. Vascular: No hyperdense vessel. Scattered calcified atherosclerosis at the skull base. Skull: Scalp soft tissues and calvarium within normal limits. Sinuses: Trace layering opacity noted within the left sphenoid sinus. Paranasal sinuses are otherwise clear. No mastoid effusion. Orbits: Globes orbital soft tissues demonstrate no acute finding. Remote posttraumatic defect noted at the left orbital floor. Review of the MIP images confirms the above findings CTA NECK FINDINGS Aortic arch: Previously identified intraluminal thrombus protruding into the aortic arch again seen, grossly similar to prior chest CT (series 13, image 323, 328). Moderate atherosclerotic irregularity throughout the visualized arch itself. No hemodynamically significant stenosis seen about the origin of the great vessels. Visualized subclavian arteries widely patent. Right carotid system: Right CCA patent from its origin to the bifurcation without stenosis. Medial is a shin of the right common carotid artery into the retropharyngeal space. Atheromatous irregularity about the right bifurcation/proximal right  ICA with associated stenosis of up to 30% by NASCET criteria. Right ICA tortuous but otherwise patent to the skull base without stenosis, dissection or occlusion. Left carotid system: Small amount of intraluminal thrombus seen protruding into the left common carotid artery at its origin (series 13, image 309). Left CCA patent from its origin to the bifurcation without flow-limiting stenosis. Medialization of the left common carotid artery into the retropharyngeal space. Mild atheromatous irregularity about the left carotid bifurcation without hemodynamically significant stenosis (no more than 25%). Left ICA tortuous but otherwise widely patent to the skull base without stenosis, dissection, or occlusion. Vertebral arteries: Both vertebral arteries arise from the subclavian arteries. Vertebral arteries mildly tortuous but widely patent within the neck without stenosis, dissection, or occlusion. Skeleton: No acute osseous abnormality. No discrete osseous lesions. 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 Patient is edentulous. Other neck: No other acute soft  tissue abnormality within the neck. Approximate 19 mm left thyroid nodule noted, with additional 1 cm adjacent left thyroid nodule. Right thyroid gland hypoplastic. Upper chest: Visualized upper chest demonstrates no other acute finding. Emphysematous changes noted. Review of the MIP images confirms the above findings CTA HEAD FINDINGS Anterior circulation: Petrous segments widely patent bilaterally. Scattered atheromatous irregularity within the cavernous/supraclinoid ICAs without hemodynamically significant stenosis. ICA termini well perfused. A1 segments widely patent. Normal anterior communicating artery. Anterior cerebral arteries patent to their distal aspects without stenosis. Right M1 widely patent. Normal right MCA bifurcation. There is apparent abrupt occlusion of a proximal right M3 branch at its origin, best seen on coronal  reformatted images (series 14, image 112). Finding consistent with the evolving subacute right MCA territory infarct. Distal right MCA branches otherwise well perfused. Left M1 widely patent. Normal left MCA bifurcation. Distal left MCA branches well perfused. Posterior circulation: Vertebral arteries patent to the vertebrobasilar junction without stenosis. Left vertebral artery dominant. Posterior inferior cerebral arteries patent bilaterally. Basilar patent to its distal aspect without stenosis. Superior cerebral arteries patent bilaterally. Both of the posterior cerebral arteries primarily supplied via the basilar and are well perfused their distal aspects. Venous sinuses: Patent. Anatomic variants: None significant. Review of the MIP images confirms the above findings IMPRESSION: CT HEAD IMPRESSION: 1. Normal expected interval evolution of subacute right MCA territory infarcts, stable in size and distribution as compared to previous exams. 2. 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 No evidence for interval re-bleeding or significant mass effect. 3. Focal area of confluent hypodensity involving the high parasagittal right frontal lobe, stable from previous. Finding is indeterminate, and could reflect sequelae of chronic ischemia or other insult. 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 CTA HEAD AND NECK IMPRESSION: 1. 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 2. 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 Finding could serve as an embolic source. 3. 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 4. Mild  to moderate scattered atheromatous change elsewhere about the major arterial vasculature of the head and neck. No other hemodynamically significant or correctable stenosis identified. Electronically Signed   By: Jeannine Boga M.D.   On: 03/06/2019 03:59      Scheduled Meds:   stroke: mapping our early stages of recovery book   Does not apply Once   acetaminophen  650 mg Oral Q8H   calcium-vitamin D  1 tablet Oral Q breakfast   DULoxetine  20 mg Oral Daily   efavirenz-emtricitabine-tenofovir  1 tablet Oral QHS   gabapentin  600 mg Oral TID   mometasone-formoterol  2 puff Inhalation BID   nicotine  21 mg Transdermal Daily   OLANZapine  2.5 mg Oral QHS   omega-3 acid ethyl esters  1 g Oral Daily   sodium chloride flush  10-40 mL Intracatheter Q12H   umeclidinium bromide  2 puff Inhalation Daily   Continuous Infusions:  heparin 1,950 Units/hr (03/05/19 1833)     LOS: 5 days      Debbe Odea, MD [facility] Pager: [email or web address] Password [facility] 03/06/2019, 11:29 AM

## 2019-03-07 ENCOUNTER — Encounter (HOSPITAL_COMMUNITY)
Admission: EM | Disposition: A | Discharge status: Skilled Nursing Facility | Payer: Self-pay | Source: Home / Self Care | Attending: Internal Medicine

## 2019-03-07 ENCOUNTER — Encounter (HOSPITAL_COMMUNITY): Payer: Self-pay | Admitting: *Deleted

## 2019-03-07 ENCOUNTER — Inpatient Hospital Stay (HOSPITAL_COMMUNITY): Payer: Medicare Other

## 2019-03-07 LAB — CULTURE, BLOOD (ROUTINE X 2)
Culture: NO GROWTH
Culture: NO GROWTH
Special Requests: ADEQUATE

## 2019-03-07 LAB — BASIC METABOLIC PANEL
Anion gap: 10 (ref 5–15)
BUN: 8 mg/dL (ref 8–23)
CO2: 31 mmol/L (ref 22–32)
Calcium: 9.2 mg/dL (ref 8.9–10.3)
Chloride: 102 mmol/L (ref 98–111)
Creatinine, Ser: 0.69 mg/dL (ref 0.44–1.00)
GFR calc Af Amer: >60 mL/min (ref >60)
GFR calc non Af Amer: >60 mL/min (ref >60)
Glucose, Bld: 107 mg/dL — ABNORMAL HIGH (ref 70–99)
Potassium: 3.2 mmol/L — ABNORMAL LOW (ref 3.5–5.1)
Sodium: 143 mmol/L (ref 135–145)

## 2019-03-07 LAB — LUPUS ANTICOAGULANT PANEL
DRVVT: 60.5 s — ABNORMAL HIGH (ref 0.0–47.0)
PTT Lupus Anticoagulant: 39 s (ref 0.0–51.9)

## 2019-03-07 LAB — CBC
HCT: 53.1 % — ABNORMAL HIGH (ref 36.0–46.0)
Hemoglobin: 14.8 g/dL (ref 12.0–15.0)
MCH: 23.7 pg — ABNORMAL LOW (ref 26.0–34.0)
MCHC: 27.9 g/dL — ABNORMAL LOW (ref 30.0–36.0)
MCV: 85 fL (ref 80.0–100.0)
Platelets: 175 10*3/uL (ref 150–400)
RBC: 6.25 MIL/uL — ABNORMAL HIGH (ref 3.87–5.11)
RDW: 23.7 % — ABNORMAL HIGH (ref 11.5–15.5)
WBC: 8.8 10*3/uL (ref 4.0–10.5)
nRBC: 0 % (ref 0.0–0.2)

## 2019-03-07 LAB — DRVVT MIX: dRVVT Mix: 44.4 s (ref 0.0–47.0)

## 2019-03-07 LAB — HEPARIN LEVEL (UNFRACTIONATED): Heparin Unfractionated: 1.34 IU/mL — ABNORMAL HIGH (ref 0.30–0.70)

## 2019-03-07 SURGERY — ECHOCARDIOGRAM, TRANSESOPHAGEAL
Anesthesia: Moderate Sedation

## 2019-03-07 MED ORDER — POTASSIUM CHLORIDE CRYS ER 20 MEQ PO TBCR
40.0000 meq | EXTENDED_RELEASE_TABLET | Freq: Once | ORAL | Status: AC
  Administered 2019-03-07: 40 meq via ORAL
  Filled 2019-03-07: qty 2

## 2019-03-07 MED ORDER — GADOBUTROL 1 MMOL/ML IV SOLN
10.0000 mL | Freq: Once | INTRAVENOUS | Status: AC | PRN
  Administered 2019-03-07: 10 mL via INTRAVENOUS

## 2019-03-07 MED ORDER — GUAIFENESIN-DM 100-10 MG/5ML PO SYRP
5.0000 mL | ORAL_SOLUTION | ORAL | Status: DC | PRN
  Administered 2019-03-07 – 2019-03-09 (×4): 5 mL via ORAL
  Filled 2019-03-07 (×4): qty 5

## 2019-03-07 MED ORDER — BISACODYL 10 MG RE SUPP
10.0000 mg | Freq: Once | RECTAL | Status: AC
  Administered 2019-03-07: 10 mg via RECTAL
  Filled 2019-03-07: qty 1

## 2019-03-07 NOTE — Progress Notes (Signed)
STROKE TEAM PROGRESS NOTE   INTERVAL HISTORY Pt sitting in chair, daughter at bedside. Her bother is on the phone. She is awake alert and in high spirit. She is orientated and moving all extremities but left side slightly weaker than the left.    Vitals:   03/06/19 2212 03/07/19 0000 03/07/19 0739 03/07/19 1131  BP:  135/76 (!) 151/81 125/76  Pulse:   100 (!) 102  Resp: 19  (!) 21 (!) 23  Temp:  98.4 F (36.9 C) 99.2 F (37.3 C) (!) 97.4 F (36.3 C)  TempSrc:  Oral Axillary Oral  SpO2: 100%  93% 93%  Weight:      Height:        CBC:  Recent Labs  Lab 03/01/19 1330  03/06/19 0412 03/07/19 0411  WBC 11.6*   < > 6.2 8.8  NEUTROABS 8.0*  --   --   --   HGB 15.1*   < > 15.9* 14.8  HCT 55.0*   < > 57.7* 53.1*  MCV 86.2   < > 84.9 85.0  PLT 149*   < > 121* 175   < > = values in this interval not displayed.    Basic Metabolic Panel:  Recent Labs  Lab 03/04/19 0332 03/07/19 0411  NA 140 143  K 4.2 3.2*  CL 104 102  CO2 25 31  GLUCOSE 96 107*  BUN 8 8  CREATININE 0.85 0.69  CALCIUM 8.2* 9.2   Lipid Panel:     Component Value Date/Time   CHOL 77 03/03/2019 0500   TRIG 70 03/03/2019 0500   HDL 15 (L) 03/03/2019 0500   CHOLHDL 5.1 03/03/2019 0500   VLDL 14 03/03/2019 0500   LDLCALC 48 03/03/2019 0500   HgbA1c:  Lab Results  Component Value Date   HGBA1C 6.4 (H) 03/03/2019   Urine Drug Screen:     Component Value Date/Time   LABOPIA NONE DETECTED 03/04/2019 1734   COCAINSCRNUR NONE DETECTED 03/04/2019 1734   LABBENZ NONE DETECTED 03/04/2019 1734   AMPHETMU NONE DETECTED 03/04/2019 1734   THCU NONE DETECTED 03/04/2019 1734   LABBARB NONE DETECTED 03/04/2019 1734    Alcohol Level No results found for: ETH  IMAGING Ct Angio Head W Or Wo Contrast  Result Date: 03/06/2019 CLINICAL DATA:  Follow-up examination for acute stroke. EXAM: CT ANGIOGRAPHY HEAD AND NECK TECHNIQUE: Multidetector CT imaging of the head and neck was performed using the standard protocol  during bolus administration of intravenous contrast. Multiplanar CT image reconstructions and MIPs were obtained to evaluate the vascular anatomy. Carotid stenosis measurements (when applicable) are obtained utilizing NASCET criteria, using the distal internal carotid diameter as the denominator. CONTRAST:  12mL OMNIPAQUE IOHEXOL 350 MG/ML SOLN COMPARISON:  Prior CT from 03/03/2019. FINDINGS: CT HEAD FINDINGS Brain: Evolving subacute predominantly right MCA territory infarct involving the right parietotemporal region again seen, relatively stable in size and distribution from previous. Probable associated faint petechial hemorrhage without hemorrhagic transformation or significant mass effect. 10 mm subacute intraparenchymal hemorrhage at the right lentiform nucleus again seen as well, relatively similar in size, but with decreased attenuation as compared to previous. Mild localized edema without significant regional mass effect. No other new acute intracranial hemorrhage. No acute large vessel territory infarct. 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 No mass lesion or midline shift. No hydrocephalus.  No extra-axial fluid collection. Vascular: No hyperdense vessel. Scattered calcified atherosclerosis at the skull base. Skull: Scalp soft tissues and calvarium within normal limits. Sinuses: Trace layering opacity noted within the left sphenoid sinus. Paranasal sinuses are otherwise clear. No mastoid effusion. Orbits: Globes orbital soft tissues demonstrate no acute finding. Remote posttraumatic defect noted at the left orbital floor. Review of the MIP images confirms the above findings CTA NECK FINDINGS Aortic arch: Previously identified intraluminal thrombus protruding into the aortic arch again seen, grossly similar to prior chest CT (series 13, image  323, 328). Moderate atherosclerotic irregularity throughout the visualized arch itself. No hemodynamically significant stenosis seen about the origin of the great vessels. Visualized subclavian arteries widely patent. Right carotid system: Right CCA patent from its origin to the bifurcation without stenosis. Medial is a shin of the right common carotid artery into the retropharyngeal space. Atheromatous irregularity about the right bifurcation/proximal right ICA with associated stenosis of up to 30% by NASCET criteria. Right ICA tortuous but otherwise patent to the skull base without stenosis, dissection or occlusion. Left carotid system: Small amount of intraluminal thrombus seen protruding into the left common carotid artery at its origin (series 13, image 309). Left CCA patent from its origin to the bifurcation without flow-limiting stenosis. Medialization of the left common carotid artery into the retropharyngeal space. Mild atheromatous irregularity about the left carotid bifurcation without hemodynamically significant stenosis (no more than 25%). Left ICA tortuous but otherwise widely patent to the skull base without stenosis, dissection, or occlusion. Vertebral arteries: Both vertebral arteries arise from the subclavian arteries. Vertebral arteries mildly tortuous but widely patent within the neck without stenosis, dissection, or occlusion. Skeleton: No acute osseous abnormality. No discrete osseous lesions. 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 Patient is edentulous. Other neck: No other acute soft tissue abnormality within the neck. Approximate 19 mm left thyroid nodule noted, with additional 1 cm adjacent left thyroid nodule. Right thyroid gland hypoplastic. Upper chest: Visualized upper chest demonstrates no other acute finding. Emphysematous changes noted. Review of the MIP images confirms the above findings CTA HEAD FINDINGS Anterior circulation: Petrous segments widely patent  bilaterally. Scattered atheromatous irregularity within the cavernous/supraclinoid ICAs without hemodynamically significant stenosis. ICA termini well perfused. A1 segments widely patent. Normal anterior communicating artery. Anterior cerebral arteries patent to their distal aspects without stenosis. Right M1 widely patent. Normal right MCA bifurcation. There is apparent abrupt occlusion of a proximal right M3 branch at its origin, best seen on coronal reformatted images (series 14, image 112). Finding consistent with the evolving subacute right MCA territory infarct. Distal right MCA branches otherwise well perfused. Left M1 widely patent. Normal left MCA bifurcation. Distal left MCA branches well perfused. Posterior circulation: Vertebral arteries patent to the vertebrobasilar junction without stenosis. Left vertebral artery dominant. Posterior inferior cerebral arteries patent bilaterally. Basilar patent to its distal aspect without stenosis. Superior cerebral arteries patent bilaterally. Both of the posterior cerebral arteries primarily supplied via the basilar and are well perfused their distal aspects. Venous sinuses: Patent. Anatomic variants: None significant. Review of the MIP images confirms the above findings IMPRESSION: CT HEAD IMPRESSION: 1. Normal expected interval evolution of subacute right MCA territory infarcts, stable in size and distribution as compared to previous exams. 2. 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 No evidence for interval re-bleeding or significant mass effect. 3. Focal area of confluent hypodensity involving the high parasagittal right  frontal lobe, stable from previous. Finding is indeterminate, and could reflect sequelae of chronic ischemia or other insult. Changes related to underlying PML could also be considered given the underlying history of HIV and  immunocompromised state. CTA HEAD AND NECK IMPRESSION: 1. 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 2. 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 Finding could serve as an embolic source. 3. 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 4. Mild to moderate scattered atheromatous change elsewhere about the major arterial vasculature of the head and neck. No other hemodynamically significant or correctable stenosis identified. Electronically Signed   By: Jeannine Boga M.D.   On: 03/06/2019 03:59   Dg Chest 2 View  Result Date: 03/01/2019 CLINICAL DATA:  Anterior chest pain post fall. EXAM: CHEST - 2 VIEW COMPARISON:  February 14, 2019 FINDINGS: Cardiomediastinal silhouette is normal. Mediastinal contours appear intact. Tortuosity of the aorta. There is no evidence of pleural effusion or pneumothorax. Linear opacities in the left mid thorax, possibly posttreatment or postsurgical changes. No radiographic evidence of displaced rib fractures. Soft tissues are grossly normal. IMPRESSION: 1. Linear opacities in the left mid thorax, possibly posttreatment or postsurgical changes. 2. No radiographic evidence of displaced rib fractures. Electronically Signed   By: Fidela Salisbury M.D.   On: 03/01/2019 14:48   Ct Head Wo Contrast  Result Date: 03/03/2019 CLINICAL DATA:  Focal neuro deficit. Unresponsive. 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 EXAM: CT HEAD WITHOUT CONTRAST TECHNIQUE: Contiguous axial images were obtained from the base of the skull through the vertex without intravenous contrast. COMPARISON:  Head CT 03/02/2019 FINDINGS: Brain: A 10 mm hemorrhage in the right lentiform nucleus with mild surrounding edema is unchanged. Additional patchy areas of hypoattenuation involving  cortex and white matter in the right temporal lobe, inferior right parietal lobe, and posteromedial right frontal lobe appears slightly more well-defined than on the prior study. No new intracranial hemorrhage, definite new infarct, midline shift, or extra-axial fluid collection is identified. The ventricles are normal in size. Background chronic small vessel ischemic changes are present in the cerebral white matter bilaterally. Vascular: Calcified atherosclerosis at the skull base. No hyperdense vessel. Skull: No acute fracture or focal osseous lesion. Sinuses/Orbits: Old left orbital floor fracture. Mild right periorbital soft tissue swelling. Mild mucosal thickening in the paranasal sinuses. Clear mastoid air cells. Other: None. IMPRESSION: Evolving acute right cerebral infarcts primarily in the MCA territory. Unchanged 10 mm right basal ganglia hemorrhage. Electronically Signed   By: Logan Bores M.D.   On: 03/03/2019 05:21   Ct Head Wo Contrast  Result Date: 03/02/2019 CLINICAL DATA:  [place] out of bed. EXAM: CT HEAD WITHOUT CONTRAST TECHNIQUE: Contiguous axial images were obtained from the base of the skull through the vertex without intravenous contrast. COMPARISON:  MR brain dated Nov 22, 2013. FINDINGS: Brain: There is hypodensity and loss of the gray-white matter differentiation predominantly involving the right temporal lobe, consistent with acute infarct. 8 x 8 x 10 mm area of hemorrhage in the right basal ganglia with surrounding edema. No midline shift, hydrocephalus, or extra-axial collection. Vascular: Atherosclerotic vascular calcification of the carotid siphons. No hyperdense vessel. Skull: Normal. Negative for fracture or focal lesion. Sinuses/Orbits: No acute finding. Mild ethmoid air cell mucosal thickening. Other: None. IMPRESSION: 1. Acute right MCA territory ischemic  infarct. 10 mm hemorrhage in the right basal ganglia. No midline shift or hydrocephalus. Critical Value/emergent results were  called by telephone at the time of interpretation on 03/02/2019 at 7:55 am to Dr. Debbe Odea, who verbally acknowledged these results. Electronically Signed   By: Titus Dubin M.D.   On: 03/02/2019 07:59   Ct Angio Neck W Or Wo Contrast  Result Date: 03/06/2019 CLINICAL DATA:  Follow-up examination for acute stroke. EXAM: CT ANGIOGRAPHY HEAD AND NECK TECHNIQUE: Multidetector CT imaging of the head and neck was performed using the standard protocol during bolus administration of intravenous contrast. Multiplanar CT image reconstructions and MIPs were obtained to evaluate the vascular anatomy. Carotid stenosis measurements (when applicable) are obtained utilizing NASCET criteria, using the distal internal carotid diameter as the denominator. CONTRAST:  66mL OMNIPAQUE IOHEXOL 350 MG/ML SOLN COMPARISON:  Prior CT from 03/03/2019. FINDINGS: CT HEAD FINDINGS Brain: Evolving subacute predominantly right MCA territory infarct involving the right parietotemporal region again seen, relatively stable in size and distribution from previous. Probable associated faint petechial hemorrhage without hemorrhagic transformation or significant mass effect. 10 mm subacute intraparenchymal hemorrhage at the right lentiform nucleus again seen as well, relatively similar in size, but with decreased attenuation as compared to previous. Mild localized edema without significant regional mass effect. No other new acute intracranial hemorrhage. No acute large vessel territory infarct. 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 No mass lesion or midline shift. No hydrocephalus. No extra-axial fluid collection. Vascular: No hyperdense vessel. Scattered calcified atherosclerosis at the skull base. Skull: Scalp soft tissues and calvarium within normal limits. Sinuses: Trace layering  opacity noted within the left sphenoid sinus. Paranasal sinuses are otherwise clear. No mastoid effusion. Orbits: Globes orbital soft tissues demonstrate no acute finding. Remote posttraumatic defect noted at the left orbital floor. Review of the MIP images confirms the above findings CTA NECK FINDINGS Aortic arch: Previously identified intraluminal thrombus protruding into the aortic arch again seen, grossly similar to prior chest CT (series 13, image 323, 328). Moderate atherosclerotic irregularity throughout the visualized arch itself. No hemodynamically significant stenosis seen about the origin of the great vessels. Visualized subclavian arteries widely patent. Right carotid system: Right CCA patent from its origin to the bifurcation without stenosis. Medial is a shin of the right common carotid artery into the retropharyngeal space. Atheromatous irregularity about the right bifurcation/proximal right ICA with associated stenosis of up to 30% by NASCET criteria. Right ICA tortuous but otherwise patent to the skull base without stenosis, dissection or occlusion. Left carotid system: Small amount of intraluminal thrombus seen protruding into the left common carotid artery at its origin (series 13, image 309). Left CCA patent from its origin to the bifurcation without flow-limiting stenosis. Medialization of the left common carotid artery into the retropharyngeal space. Mild atheromatous irregularity about the left carotid bifurcation without hemodynamically significant stenosis (no more than 25%). Left ICA tortuous but otherwise widely patent to the skull base without stenosis, dissection, or occlusion. Vertebral arteries: Both vertebral arteries arise from the subclavian arteries. Vertebral arteries mildly tortuous but widely patent within the neck without stenosis, dissection, or occlusion. Skeleton: No acute osseous abnormality. No discrete osseous lesions. Moderate to advanced cervical spondylolysis at C5-6  and C6-7. Patient is edentulous. Other neck: No other acute soft tissue abnormality within the neck. Approximate 19 mm left thyroid nodule  noted, with additional 1 cm adjacent left thyroid nodule. Right thyroid gland hypoplastic. Upper chest: Visualized upper chest demonstrates no other acute finding. Emphysematous changes noted. Review of the MIP images confirms the above findings CTA HEAD FINDINGS Anterior circulation: Petrous segments widely patent bilaterally. Scattered atheromatous irregularity within the cavernous/supraclinoid ICAs without hemodynamically significant stenosis. ICA termini well perfused. A1 segments widely patent. Normal anterior communicating artery. Anterior cerebral arteries patent to their distal aspects without stenosis. Right M1 widely patent. Normal right MCA bifurcation. There is apparent abrupt occlusion of a proximal right M3 branch at its origin, best seen on coronal reformatted images (series 14, image 112). Finding consistent with the evolving subacute right MCA territory infarct. Distal right MCA branches otherwise well perfused. Left M1 widely patent. Normal left MCA bifurcation. Distal left MCA branches well perfused. Posterior circulation: Vertebral arteries patent to the vertebrobasilar junction without stenosis. Left vertebral artery dominant. Posterior inferior cerebral arteries patent bilaterally. Basilar patent to its distal aspect without stenosis. Superior cerebral arteries patent bilaterally. Both of the posterior cerebral arteries primarily supplied via the basilar and are well perfused their distal aspects. Venous sinuses: Patent. Anatomic variants: None significant. Review of the MIP images confirms the above findings IMPRESSION: CT HEAD IMPRESSION: 1. Normal expected interval evolution of subacute right MCA territory infarcts, stable in size and distribution as compared to previous exams. 2. Normal expected interval evolution of 10 mm right basal ganglia hemorrhage,  likely subacute right MCA territory infarct with hemorrhagic transformation. No evidence for interval re-bleeding or significant mass effect. 3. Focal area of confluent hypodensity involving the high parasagittal right frontal lobe, stable from previous. Finding is indeterminate, and could reflect sequelae of chronic ischemia or other insult. 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 CTA HEAD AND NECK IMPRESSION: 1. 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 2. 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 Finding could serve as an embolic source. 3. 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 4. Mild to moderate scattered atheromatous change elsewhere about the major arterial vasculature of the head and neck. No other hemodynamically significant or correctable stenosis identified. Electronically Signed   By: Jeannine Boga M.D.   On: 03/06/2019 03:59   Ct Chest W Contrast  Result Date: 03/01/2019 CLINICAL DATA:  Patient status post fall out of bed at 6 a.m. this morning. Bilateral lower extremity weakness. EXAM: CT CHEST, ABDOMEN, AND PELVIS WITH CONTRAST TECHNIQUE: Multidetector CT imaging of the chest, abdomen and pelvis was performed following the standard protocol during bolus administration of intravenous contrast. CONTRAST:  135mL OMNIPAQUE IOHEXOL 300 MG/ML  SOLN COMPARISON:  CT chest 11/03/2013.  PET CT scan 11/22/2013. FINDINGS: CT CHEST FINDINGS Cardiovascular: Pulmonary embolus are seen in right pulmonary arteries such as on image 24 of series 6 and 64-67 of series 6. Emboli are also seen in the aortic arch such as on images 21 and 22 of series 3 and 69 to 76 of series 7. Heart size is normal. No  pleural or pericardial effusion. Calcific aortic and coronary atherosclerosis. Mediastinum/Nodes: No enlarged mediastinal, hilar, or axillary lymph nodes. Thyroid gland, trachea, and esophagus demonstrate no significant findings. Lungs/Pleura: Lungs are emphysematous. There is scar in the left upper lobe at the site of patient's prior  lung cancer. Mild dependent atelectasis is noted. Musculoskeletal: No acute or focal bony abnormality. CT ABDOMEN PELVIS FINDINGS Hepatobiliary: No focal liver abnormality is seen. No gallstones, gallbladder wall thickening, or biliary dilatation. The liver is low attenuating consistent with fatty infiltration. Pancreas: 0.4 cm hypoattenuating lesion at the junction of the tail and body on image 65 is likely due to some prior inflammatory process. No peripancreatic inflammatory change. No pancreatic duct dilatation. Spleen: Wedge-shaped hypoattenuation in the superior aspect of the spleen measuring 2.7 cm AP x 2.7 cm transverse by 2.0 cm craniocaudal is consistent with infarct. Spleen is otherwise unremarkable. Adrenals/Urinary Tract: The adrenal glands appear normal. Wedge-shaped area peripheral hypoattenuation in the mid to upper pole of the right kidney is compatible with an infarct. More subtle area of decreased cortical attenuation in the more superior aspect of the right kidney is also worrisome for infarct. Left kidney appears normal. Ureters and urinary bladder are unremarkable. Stomach/Bowel: Stomach is within normal limits. Appendix appears normal. No evidence of bowel wall thickening, distention, or inflammatory changes. Vascular/Lymphatic: Aortic atherosclerosis. No enlarged abdominal or pelvic lymph nodes. Reproductive: Uterus and bilateral adnexa are unremarkable. Other: None. Musculoskeletal: No acute or focal abnormality. Degenerative disc disease L5-S1 noted. 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  Findings raise the possibility for ventricular or atrial septal defect. No evidence of trauma. Fatty infiltration of the liver. Atherosclerosis. Emphysema. These results were called by telephone at the time of interpretation on 03/01/2019 at 7:29 pm to [facility], [facility], who verbally acknowledged these results. Electronically Signed   By: Inge Rise M.D.   On: 03/01/2019 19:36   Mr Jeri Cos TF Contrast  Result Date: 03/07/2019 CLINICAL DATA:  73 year old female with right hemisphere/right MCA territory infarct detected by head CT since 03/02/2019. Right MCA M3 occlusion on CTA yesterday. History of HIV and lung cancer. EXAM: MRI HEAD WITHOUT AND WITH CONTRAST TECHNIQUE: Multiplanar, multiecho pulse sequences of the brain and surrounding structures were obtained without and with intravenous contrast. CONTRAST:  10 milliliters Gadavist COMPARISON:  CTA head and neck 03/06/2019. Head CTs 03/03/2019 and earlier. Brain MRI 11/22/2013. FINDINGS: Study is intermittently degraded by motion artifact despite repeated imaging attempts. Brain: Confluent 3 centimeter area of restricted diffusion at the junction of the posterior right temporal lobe, anterior right occipital lobe and inferior right parietal lobe corresponding to the dominant area of hypodensity by CT recently. Associated T2 and FLAIR hyperintensity compatible with cytotoxic edema. Superimposed patchy restricted diffusion in the right periatrial white matter, and small oval petechial hemorrhage in the posterior right lentiform with surrounding restricted diffusion (stable and 10 millimeters on series 13, image 13 today). Restricted diffusion in the nearby right corona radiata and posterior right insula. Multiple small cortical and subcortical white matter foci of restricted diffusion elsewhere in the posterior right MCA territory (series 5 images 80 through 85), and also in the right frontal operculum. No restricted diffusion in the contralateral left  hemisphere. Although there is a questionable punctate area of acute to subacute diffusion restriction in the right cerebellum on series 5, image 55. No other blood products identified. Abnormal T2 and FLAIR hyperintensity in the medial superior right frontal gyrus new since 2015 and seen by CT is facilitated on diffusion, but is associated with nodular heterogeneous enhancement which appears to be partially extra-axial (series 9, image 11 and series 16, image 25), although only 1 postcontrast sequence could be obtained. This extra-axial component  of the lesion is dark on T2 imaging, and the underlying gyrus is mildly expanded. This is new since 2015. Elsewhere there is post ischemic enhancement with some intrinsic T1 hyperintense blood products in the right lentiform. There is mild post ischemic enhancement at the most confluent area of restricted diffusion. There is also punctate enhancement suggested at the right frontal operculum on series 16, image 19 which is probably corresponding to a small cortical infarct on series 5, image 83. No other abnormal intracranial enhancement is identified. No superimposed midline shift, ventriculomegaly. The left deep gray nuclei and brainstem appear to remain normal. Mild scattered chronic cerebral white matter T2 and FLAIR hyperintensity elsewhere is stable since 2015. Cervicomedullary junction and pituitary are within normal limits. Vascular: Major intracranial vascular flow voids are stable since 2015. Skull and upper cervical spine: Nonspecific decreased T1 signal in the upper cervical vertebrae since 2015. No suspicious skull bone marrow signal changes, including overlying the right superior frontal gyrus lesion. Sinuses/Orbits: Chronic left orbital floor fracture, stable. Other: Mastoids are clear. IMPRESSION: 1. Nodular and enhancing right superior frontal gyrus and/or extra-axial mass measuring about 15 mm with mild regional vasogenic edema is new since the 2015 MRI.  Only 1 motion degraded post-contrast sequence could be obtained today. Given this patient's medical history differential considerations in lung cancer metastasis, HIV related infection, CNS lymphoma, or less likely meningioma. A repeat Brain MRI without and with contrast when the patient can better cooperate might refine the differential diagnosis. 2. Scattered acute infarcts in the right hemisphere as on CT, mostly in the right MCA territory. Stable associated petechial hemorrhage in the posterior right lentiform. Possible punctate infarct in the right cerebellum, but no left-side vascular territory involvement. 3. No significant intracranial mass effect. Electronically Signed   By: Genevie Ann M.D.   On: 03/07/2019 06:24   Ct Abdomen Pelvis W Contrast  Result Date: 03/01/2019 CLINICAL DATA:  Patient status post fall out of bed at 6 a.m. this morning. Bilateral lower extremity weakness. EXAM: CT CHEST, ABDOMEN, AND PELVIS WITH CONTRAST TECHNIQUE: Multidetector CT imaging of the chest, abdomen and pelvis was performed following the standard protocol during bolus administration of intravenous contrast. CONTRAST:  174mL OMNIPAQUE IOHEXOL 300 MG/ML  SOLN COMPARISON:  CT chest 11/03/2013.  PET CT scan 11/22/2013. FINDINGS: CT CHEST FINDINGS Cardiovascular: Pulmonary embolus are seen in right pulmonary arteries such as on image 63 of series 6 and 64-67 of series 6. Emboli are also seen in the aortic arch such as on images 21 and 22 of series 3 and 69 to 76 of series 7. Heart size is normal. No pleural or pericardial effusion. Calcific aortic and coronary atherosclerosis. Mediastinum/Nodes: No enlarged mediastinal, hilar, or axillary lymph nodes. Thyroid gland, trachea, and esophagus demonstrate no significant findings. Lungs/Pleura: Lungs are emphysematous. There is scar in the left upper lobe at the site of patient's prior lung cancer. Mild dependent atelectasis is noted. Musculoskeletal: No acute or focal bony  abnormality. CT ABDOMEN PELVIS FINDINGS Hepatobiliary: No focal liver abnormality is seen. No gallstones, gallbladder wall thickening, or biliary dilatation. The liver is low attenuating consistent with fatty infiltration. Pancreas: 0.4 cm hypoattenuating lesion at the junction of the tail and body on image 65 is likely due to some prior inflammatory process. No peripancreatic inflammatory change. No pancreatic duct dilatation. Spleen: Wedge-shaped hypoattenuation in the superior aspect of the spleen measuring 2.7 cm AP x 2.7 cm transverse by 2.0 cm craniocaudal is consistent with infarct. Spleen is otherwise unremarkable.  Adrenals/Urinary Tract: The adrenal glands appear normal. Wedge-shaped area peripheral hypoattenuation in the mid to upper pole of the right kidney is compatible with an infarct. More subtle area of decreased cortical attenuation in the more superior aspect of the right kidney is also worrisome for infarct. Left kidney appears normal. Ureters and urinary bladder are unremarkable. Stomach/Bowel: Stomach is within normal limits. Appendix appears normal. No evidence of bowel wall thickening, distention, or inflammatory changes. Vascular/Lymphatic: Aortic atherosclerosis. No enlarged abdominal or pelvic lymph nodes. Reproductive: Uterus and bilateral adnexa are unremarkable. Other: None. Musculoskeletal: No acute or focal abnormality. Degenerative disc disease L5-S1 noted. 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 Findings raise the possibility for ventricular or atrial septal defect. No evidence of trauma. Fatty infiltration of the liver. Atherosclerosis. Emphysema. These results were called by telephone at the time of interpretation on 03/01/2019 at 7:29 pm to [facility], who verbally acknowledged these results. Electronically Signed   By: Inge Rise M.D.   On: 03/01/2019 19:36   Dg Chest Port 1 View  Result Date:  03/03/2019 CLINICAL DATA:  Respiratory failure EXAM: PORTABLE CHEST 1 VIEW COMPARISON:  03/01/2019 FINDINGS: Cardiac shadow is mildly enlarged but stable. Aortic calcifications are again seen. The lungs are well aerated bilaterally. Increased density in the lateral aspect of the right lung base is noted likely related to the patient's known pulmonary emboli. Irregularity of the fifth and sixth ribs on the left laterally is seen. These were not present on prior chest x-rays from 2016 and likely related to the patient's known history of prior lung carcinoma with mild healing. No acute fracture is seen. IMPRESSION: Slight increase in parenchymal opacity in the right lung base laterally likely related to the known underlying pulmonary emboli. Old deformities of the fifth and sixth ribs on the left likely related to the patient's known history of prior lung carcinoma. Electronically Signed   By: Inez Catalina M.D.   On: 03/03/2019 06:10   Dg Chest Port 1 View  Result Date: 02/14/2019 CLINICAL DATA:  Shortness of breath EXAM: PORTABLE CHEST 1 VIEW COMPARISON:  02/13/2019 FINDINGS: Cardiac shadow is stable. The lungs are well aerated bilaterally. No focal infiltrate or sizable effusion is noted. No bony abnormality is seen. IMPRESSION: No acute abnormality noted. Electronically Signed   By: Inez Catalina M.D.   On: 02/14/2019 07:51   Dg Chest Port 1 View  Result Date: 02/13/2019 CLINICAL DATA:  Shortness of breath. Respiratory failure and hypoxia. EXAM: PORTABLE CHEST 1 VIEW COMPARISON:  02/12/2019 FINDINGS: Prominent main pulmonary artery. Mild enlargement of the cardiopericardial silhouette indistinct opacity along the lingula. Left midlung scarring. Peripheral left basilar bandlike density. The right lung appears clear. Chronic deformity of the left lateral fifth and sixth ribs. IMPRESSION: 1. New indistinct lingular opacity peripherally. This could be from a pleural process or airspace opacity. 2. Continued  peripheral scarring in the left mid lung with adjacent chronic rib deformities. 3. Mild enlargement of the cardiopericardial silhouette, without edema. 4. Prominent main pulmonary artery suggesting pulmonary arterial hypertension. Electronically Signed   By: Van Clines M.D.   On: 02/13/2019 08:03   Dg Chest Port 1 View  Result Date: 02/12/2019 CLINICAL DATA:  Acute chronic respiratory failure. EXAM: PORTABLE CHEST 1 VIEW COMPARISON:  02/11/2019 FINDINGS: Heart size is normal. No pleural effusion or edema. Stable chronic scarring within the left midlung. No superimposed airspace consolidation. IMPRESSION: 1. No active disease.  Unchanged left midlung scarring. Electronically Signed   By: Kerby Moors M.D.   On: 02/12/2019 08:28   Dg Chest Port 1 View  Result Date: 02/11/2019 CLINICAL DATA:  Shortness of breath. EXAM: PORTABLE CHEST 1 VIEW COMPARISON:  Chest x-rays dated 03/29/2015 and 01/11/2012. FINDINGS: Borderline cardiomegaly is stable. Fullness of the LEFT aortopulmonary window region, most likely a prominent main pulmonary artery suggesting chronic pulmonary artery hypertension. Increased interstitial prominence bilaterally, bibasilar predominant, LEFT slightly greater than RIGHT, presumably mild edema superimposed on chronic interstitial lung disease. No confluent opacity to suggest a consolidating pneumonia. No pleural effusion or pneumothorax seen. Osseous structures about the chest are unremarkable. IMPRESSION: 1. Probable mild bilateral interstitial edema superimposed on chronic interstitial lung disease. 2. No evidence of consolidating pneumonia or alveolar pulmonary edema. 3. Probable chronic pulmonary artery hypertension. 4. Stable borderline cardiomegaly. Electronically Signed   By: Franki Cabot M.D.   On: 02/11/2019 18:12   Dg Hips Bilat W Or Wo Pelvis 3-4 Views  Result Date: 03/01/2019 CLINICAL DATA:  Bilateral hip pain after fall today. EXAM: DG HIP (WITH OR WITHOUT PELVIS)  3-4V BILAT COMPARISON:  None. FINDINGS: There is no evidence of hip fracture or dislocation. There is no evidence of arthropathy or other focal bone abnormality. IMPRESSION: Negative. Electronically Signed   By: Marijo Conception M.D.   On: 03/01/2019 14:45   Vas US Carotid (at [place] Only)  Result Date: 03/02/2019 Carotid Arterial Duplex Study Indications:       CVA. Risk Factors:      None. Limitations        Today's exam was limited due to the body habitus of the                    patient, patient movement, patient positioning and the                    patient's respiratory variation. Comparison Study:  No prior studies. Performing Technologist: Oliver Hum RVT  Examination Guidelines: A complete evaluation includes B-mode imaging, spectral Doppler, color Doppler, and power Doppler as needed of all accessible portions of each vessel. Bilateral testing is considered an integral part of a complete examination. Limited examinations for reoccurring indications may be performed as noted.  Right Carotid Findings: +----------+--------+--------+--------+-----------------------+--------+           PSV cm/sEDV cm/sStenosisPlaque Description     Comments +----------+--------+--------+--------+-----------------------+--------+ CCA Prox  68      13              smooth and heterogenous         +----------+--------+--------+--------+-----------------------+--------+ CCA Distal65      11              smooth and heterogenous         +----------+--------+--------+--------+-----------------------+--------+ ICA Prox  58      14              smooth and heterogenoustortuous +----------+--------+--------+--------+-----------------------+--------+ ICA Distal57      22                                     tortuous +----------+--------+--------+--------+-----------------------+--------+ ECA       60      11                                               +----------+--------+--------+--------+-----------------------+--------+ +----------+--------+-------+--------+-------------------+  PSV cm/sEDV cmsDescribeArm Pressure (mmHG) +----------+--------+-------+--------+-------------------+ Subclavian111                                        +----------+--------+-------+--------+-------------------+ +---------+--------+--+--------+--+---------+ VertebralPSV cm/s73EDV cm/s24Antegrade +---------+--------+--+--------+--+---------+  Left Carotid Findings: +----------+--------+--------+--------+-----------------------+--------+           PSV cm/sEDV cm/sStenosisPlaque Description     Comments +----------+--------+--------+--------+-----------------------+--------+ CCA Prox  101     19              smooth and heterogenous         +----------+--------+--------+--------+-----------------------+--------+ CCA Distal79      16              smooth and heterogenous         +----------+--------+--------+--------+-----------------------+--------+ ICA Prox  80      25              smooth and heterogenoustortuous +----------+--------+--------+--------+-----------------------+--------+ ICA Distal60      29                                     tortuous +----------+--------+--------+--------+-----------------------+--------+ ECA       66                                                      +----------+--------+--------+--------+-----------------------+--------+ +----------+--------+--------+--------+-------------------+           PSV cm/sEDV cm/sDescribeArm Pressure (mmHG) +----------+--------+--------+--------+-------------------+ [id number]                                         +----------+--------+--------+--------+-------------------+ +---------+--------+--+--------+--+---------+ VertebralPSV cm/s72EDV cm/s27Antegrade +---------+--------+--+--------+--+---------+  Summary: Right Carotid:  Velocities in the right ICA are consistent with a 1-39% stenosis. Left Carotid: Velocities in the left ICA are consistent with a 1-39% stenosis. Vertebrals: Bilateral vertebral arteries demonstrate antegrade flow. *See table(s) above for measurements and observations.  Electronically signed by Antony Contras MD on 03/02/2019 at 1:16:38 PM.    Final    Vas [name] Lower Extremity Venous (dvt)  Result Date: 03/02/2019  Lower Venous Study Indications: Pulmonary embolism, and stroke.  Risk Factors: None identified. Limitations: Body habitus, poor ultrasound/tissue interface and patient movement, patient positioning. Comparison Study: No prior studies. Performing Technologist: Oliver Hum RVT  Examination Guidelines: A complete evaluation includes B-mode imaging, spectral Doppler, color Doppler, and power Doppler as needed of all accessible portions of each vessel. Bilateral testing is considered an integral part of a complete examination. Limited examinations for reoccurring indications may be performed as noted.  +---------+---------------+---------+-----------+----------+--------------+ RIGHT    CompressibilityPhasicitySpontaneityPropertiesThrombus Aging +---------+---------------+---------+-----------+----------+--------------+ CFV      Full           Yes      Yes                                 +---------+---------------+---------+-----------+----------+--------------+ SFJ      Full                                                        +---------+---------------+---------+-----------+----------+--------------+  FV Prox  Full                                                        +---------+---------------+---------+-----------+----------+--------------+ FV Mid   Full                                                        +---------+---------------+---------+-----------+----------+--------------+ FV DistalFull                                                         +---------+---------------+---------+-----------+----------+--------------+ PFV      Full                                                        +---------+---------------+---------+-----------+----------+--------------+ POP      Full           Yes      Yes                                 +---------+---------------+---------+-----------+----------+--------------+ PTV      Full                                                        +---------+---------------+---------+-----------+----------+--------------+ PERO     Full                                                        +---------+---------------+---------+-----------+----------+--------------+   +---------+---------------+---------+-----------+----------+--------------+ LEFT     CompressibilityPhasicitySpontaneityPropertiesThrombus Aging +---------+---------------+---------+-----------+----------+--------------+ CFV      Full           Yes      Yes                                 +---------+---------------+---------+-----------+----------+--------------+ SFJ      Full                                                        +---------+---------------+---------+-----------+----------+--------------+ FV Prox  Full                                                        +---------+---------------+---------+-----------+----------+--------------+  FV Mid   Full                                                        +---------+---------------+---------+-----------+----------+--------------+ FV DistalFull                                                        +---------+---------------+---------+-----------+----------+--------------+ PFV      Full                                                        +---------+---------------+---------+-----------+----------+--------------+ POP      Full           Yes      Yes                                  +---------+---------------+---------+-----------+----------+--------------+ PTV      Full                                                        +---------+---------------+---------+-----------+----------+--------------+ PERO     Full                                                        +---------+---------------+---------+-----------+----------+--------------+     Summary: Right: There is no evidence of deep vein thrombosis in the lower extremity. No cystic structure found in the popliteal fossa. Left: There is no evidence of deep vein thrombosis in the lower extremity. No cystic structure found in the popliteal fossa.  *See table(s) above for measurements and observations. Electronically signed by Servando Snare MD on 03/02/2019 at 3:40:30 PM.    Final    [name] Ekg Site Rite  Result Date: 03/03/2019 If Site Rite image not attached, placement could not be confirmed due to current cardiac rhythm.    PHYSICAL EXAM     Temp:  [97.4 F (36.3 C)-99.2 F (37.3 C)] 97.4 F (36.3 C) (09/08 1131) Pulse Rate:  [95-107] 102 (09/08 1131) Resp:  [14-24] 23 (09/08 1131) BP: (125-159)/(76-86) 125/76 (09/08 1131) SpO2:  [93 %-100 %] 93 % (09/08 1131)  General - Well nourished, well developed, not in distress.  Ophthalmologic - fundi not visualized due to noncooperation.  Cardiovascular - Regular rate and rhythm.  Neuro - awake alert, orientated to place, people, time and self. No aphasia, able to name and repeat. Slight dysarthria. PERRL, EOMI, no neglect. Visual field full. Left facial nasolabial fold flattening. Tongue midline. LUE and LLE 4/5 and RUE and RLE 5/5. Sensation symmetrical. FTN intact bilaterally. Gait not tested.   ASSESSMENT/PLAN [name] is a  73 y.o. female with history of non-small cell lung cancer s/p radiation therapy, HIV, Hep B, COPD, HLD, CKD stg 3, and legally blind who presented with fall, generalized weakness, and chest pain, found to have embolic infarcts  in R MCA territory with subacute hemorrhagic component along with PE and prominent thrombus in aortic arch.  Stroke: Acute R MCA territory infarcts, with hemorrhagic conversion at BG, embolic pattern concerning for PFO especially in setting of PE vs. Hypercoagulable state given new brain mass and hx of cancer  CT head - Acute right MCA territory ischemic infarct. 10 mm hemorrhage in the right basal ganglia. No midline shift or hydrocephalus.  MRI right MCA infarcts and HT at right BG  CTA head and neck right M3 occlusion, again seen aortic arch thrombus  2D Echo EF > 65%, moderate pulmonary HTN, no PFO seen  Carotid Doppler unremarkable  LE venous Doppler negative for DVT  TEE pending tomorrow  LDL 48  HgbA1c 6.4   Hypercoagulable work-up - 03/01/19 - so far negative (protein S low 52), but some still pending  UDS - 03/04/19 - negative  IV heparin for VTE ppx  ASA 81 prior to admission, now on eliquis due to PE, aortic arch thrombus and embolism.   Therapy recommendations:  SNF  Disposition: pending  Embolism - Pulmonary, Aortic  CT chest showed primary emboli and emboli in aortic arch  CT abdomen pelvis showed right renal infarcts as well as splenic infarct.  No cancer  Right MCA territory infarct  LE venous Doppler negative for DVT  Concerning for PFO related embolism in the setting of PE  TEE tomorrow to rule out PFO and endocarditis in the pt with hx of HIV and substance abuse  Acute hypercapnic respiratory failure  rapid response was called 9/4 early am for sudden mental status change  BP in 50s with agonal breathing  ABG PCO2 80  History of COPD  Treated with BiPAP, much improved  Management as per primary team  HIV  CD4 count pending  Viral load 30  CD4 on 02/13/2019 was 406  Low suspicious for PML given low viral load and no significant cerebral edema on CT  Brain mass  MRI brain with and without contrast Nodular and enhancing right  superior frontal gyrus and/or extra-axial mass with mild regional vasogenic edema is new since the 2015 MRI.  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 Pt agitation and restless and body aches ? part of the substance withdraw syndrome  Substance cessation education will be provided  Tobacco abuse / COPD  Current smoker  Smoking cessation counseling will be provided  Other Stroke Risk Factors  Advanced age  Obesity, Body mass index is 32.78 kg/m., recommend weight loss, diet and exercise as appropriate    LLL cancer T1b s/p radiation  Other active issues  Acute thrombocytopenia - platelet 121->175  Neuropathy in legs  CKD stage 2-3Severe COPD  +UA / UCx > 100K aerococcus  Hepatitis B  Legally blind  Agitation - Zyprexa per Palliative Care. (also has prn Haldol available)  Pain - Oxycodone, gabapentin and Robaxin per [facility] day #6  Rosalin Hawking, MD PhD Stroke Neurology  03/07/2019 2:01 PM   To contact Stroke Continuity provider, please refer to [email or web address] After hours, contact General Neurology

## 2019-03-07 NOTE — Evaluation (Signed)
Occupational Therapy Evaluation Patient Details Name: [name] MRN: [id number] DOB: May 05, 1946 Today's Date: 03/07/2019    History of Present Illness 73yo F dmitted 9/2 after a fall with generalized weakness and CP.  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 As of 9/4, pt with AMS CT H is evolving.PMH HIV, adenocarcinoma of LUL s/p radiation therapy, HTN, HLD, COPD, chronic respiratory failure on 4 L/min oxygen at baseline, asthma, HBV, tobacco abuse, CKD III.   Clinical Impression   Pt progressing very well this session. Sitter outside of room and pt already in recliner upon arrival. Pt performing x4 sit to stands with minguardA and cueing for proper hand placement for BUEs. Pt performing marching in standing x10 reps each; and standing x1 min. BUE HEP increasing to x10 reps; LUE 3-/5 MM grade mostly- pt requiring assist to grasp RW. Pt performing grooming with set-upA and mostly RUE. Pt continues to have weakness in L side and instability with RW. Pt able to take a few steps forward and backward. Pt would benefit from continued OT skilled services for ADL, mobility and safety in SNF setting. OT following acutely.    Follow Up Recommendations  SNF    Equipment Recommendations  3 in 1 bedside commode    Recommendations for Other Services       Precautions / Restrictions Precautions Precautions: Fall;Other (comment) Precaution Comments: watch O2 Restrictions Weight Bearing Restrictions: No      Mobility Bed Mobility Overal bed mobility: Needs Assistance             General bed mobility comments: in recliner- RN and NT put her there reports stand pivot +1  Transfers Overall transfer level: Needs assistance Equipment used: Rolling walker (2 wheeled) Transfers: Sit to/from [facility] Sit to Stand: Min guard Stand pivot transfers: Min assist       General transfer comment:  Guidance for proper hand placement    Balance Overall balance assessment: Needs assistance Sitting-balance support: No upper extremity supported;Feet supported Sitting balance-Leahy Scale: Good       Standing balance-Leahy Scale: Poor Standing balance comment: reliant on RW, but able to march in standing with increased time.               High Level Balance Comments: Performing with RW and minguardA for stability, no LOB epsides at this time           ADL either performed or assessed with clinical judgement   ADL Overall ADL's : Needs assistance/impaired Eating/Feeding: Set up;Sitting   Grooming: Min guard;Sitting   Upper Body Bathing: Minimal assistance;Sitting   Lower Body Bathing: Moderate assistance;Sitting/lateral leans;Sit to/from stand;Cueing for safety;Cueing for sequencing   Upper Body Dressing : Min guard;Sitting   Lower Body Dressing: Moderate assistance;Sitting/lateral leans;Sit to/from stand   Toilet Transfer: Minimal assistance;Stand-pivot;BSC   Toileting- Clothing Manipulation and Hygiene: Moderate assistance;Sitting/lateral lean;Sit to/from stand       Functional mobility during ADLs: Minimal assistance;Rolling walker General ADL Comments: Pt set-upA for UB ADL; Mod A for LB ADL     Vision   Vision Assessment?: Vision impaired- to be further tested in functional context     Perception     Praxis      Pertinent Vitals/Pain Pain Assessment: Faces Faces Pain Scale: No hurt     Hand Dominance     Extremity/Trunk Assessment Upper Extremity Assessment Upper Extremity Assessment: Generalized weakness;RUE deficits/detail;LUE deficits/detail RUE  Deficits / Details: 3+/5 MM grade LUE Deficits / Details: 3-/5 MM grade LUE Coordination: decreased gross motor;decreased fine motor   Lower Extremity Assessment Lower Extremity Assessment: Generalized weakness       Communication     Cognition Arousal/Alertness: Lethargic   Overall  Cognitive Status: Difficult to assess Area of Impairment: Awareness;Problem solving                       Following Commands: Follows multi-step commands with increased time Safety/Judgement: Decreased awareness of deficits   Problem Solving: Slow processing     General Comments       Exercises Exercises: General Upper Extremity General Exercises - Upper Extremity Shoulder Flexion: AROM;Both;10 reps;Seated Elbow Flexion: AROM;Both;10 reps;Seated Digit Composite Flexion: AROM;Both;10 reps;Seated Chair Push Up: AROM;Both;Seated(3x)   Shoulder Instructions      Home Living                                          Prior Functioning/Environment                   OT Problem List:        OT Treatment/Interventions:      OT Goals(Current goals can be found in the care plan section) Acute Rehab OT Goals Patient Stated Goal: unable to state OT Goal Formulation: With patient Time For Goal Achievement: 03/18/19 Potential to Achieve Goals: Good ADL Goals Pt Will Perform Eating: sitting;with set-up Pt Will Perform Grooming: with set-up;sitting Pt Will Transfer to Toilet: bedside commode;with min guard assist Pt/caregiver will Perform Home Exercise Program: Increased strength;Both right and left upper extremity;With written HEP provided;Independently Additional ADL Goal #1: Pt will attend to task x10 mins with 1-2 verbal cues to sustain attention  OT Frequency: Min 2X/week   Barriers to D/C:            Co-evaluation              AM-PAC OT "6 Clicks" Daily Activity     Outcome Measure Help from another person eating meals?: None Help from another person taking care of personal grooming?: A Little Help from another person toileting, which includes using toliet, bedpan, or urinal?: A Little Help from another person bathing (including washing, rinsing, drying)?: A Little Help from another person to put on and taking off regular upper body  clothing?: A Little Help from another person to put on and taking off regular lower body clothing?: A Lot 6 Click Score: 18   End of Session Equipment Utilized During Treatment: Gait belt;Rolling walker Nurse Communication: Mobility status  Activity Tolerance: Patient tolerated treatment well Patient left: in chair;with call bell/phone within reach;with nursing/sitter in room  OT Visit Diagnosis: Unsteadiness on feet (R26.81);Muscle weakness (generalized) (M62.81)                Time: 5027-7412 OT Time Calculation (min): 20 min Charges:  OT General Charges $OT Visit: 1 Visit OT Treatments $Neuromuscular Re-education: 8-22 mins  Darryl Nestle) Marsa Aris OTR/L Acute Rehabilitation Services Pager: [phone] Office: [phone]   Audie Pinto 03/07/2019, 1:56 PM

## 2019-03-07 NOTE — Progress Notes (Signed)
Patient unable to do inhaler at this time. Patient not showing distress. Vitals are stable. SPO2 99% on 3 L nasal cannula.

## 2019-03-07 NOTE — H&P (View-Only) (Signed)
STROKE TEAM PROGRESS NOTE   INTERVAL HISTORY Pt sitting in chair, daughter at bedside. Her bother is on the phone. She is awake alert and in high spirit. She is orientated and moving all extremities but left side slightly weaker than the left.    Vitals:   03/06/19 2212 03/07/19 0000 03/07/19 0739 03/07/19 1131  BP:  135/76 (!) 151/81 125/76  Pulse:   100 (!) 102  Resp: 19  (!) 21 (!) 23  Temp:  98.4 F (36.9 C) 99.2 F (37.3 C) (!) 97.4 F (36.3 C)  TempSrc:  Oral Axillary Oral  SpO2: 100%  93% 93%  Weight:      Height:        CBC:  Recent Labs  Lab 03/01/19 1330  03/06/19 0412 03/07/19 0411  WBC 11.6*   < > 6.2 8.8  NEUTROABS 8.0*  --   --   --   HGB 15.1*   < > 15.9* 14.8  HCT 55.0*   < > 57.7* 53.1*  MCV 86.2   < > 84.9 85.0  PLT 149*   < > 121* 175   < > = values in this interval not displayed.    Basic Metabolic Panel:  Recent Labs  Lab 03/04/19 0332 03/07/19 0411  NA 140 143  K 4.2 3.2*  CL 104 102  CO2 25 31  GLUCOSE 96 107*  BUN 8 8  CREATININE 0.85 0.69  CALCIUM 8.2* 9.2   Lipid Panel:     Component Value Date/Time   CHOL 77 03/03/2019 0500   TRIG 70 03/03/2019 0500   HDL 15 (L) 03/03/2019 0500   CHOLHDL 5.1 03/03/2019 0500   VLDL 14 03/03/2019 0500   LDLCALC 48 03/03/2019 0500   HgbA1c:  Lab Results  Component Value Date   HGBA1C 6.4 (H) 03/03/2019   Urine Drug Screen:     Component Value Date/Time   LABOPIA NONE DETECTED 03/04/2019 1734   COCAINSCRNUR NONE DETECTED 03/04/2019 1734   LABBENZ NONE DETECTED 03/04/2019 1734   AMPHETMU NONE DETECTED 03/04/2019 1734   THCU NONE DETECTED 03/04/2019 1734   LABBARB NONE DETECTED 03/04/2019 1734    Alcohol Level No results found for: ETH  IMAGING Ct Angio Head W Or Wo Contrast  Result Date: 03/06/2019 CLINICAL DATA:  Follow-up examination for acute stroke. EXAM: CT ANGIOGRAPHY HEAD AND NECK TECHNIQUE: Multidetector CT imaging of the head and neck was performed using the standard protocol  during bolus administration of intravenous contrast. Multiplanar CT image reconstructions and MIPs were obtained to evaluate the vascular anatomy. Carotid stenosis measurements (when applicable) are obtained utilizing NASCET criteria, using the distal internal carotid diameter as the denominator. CONTRAST:  60mL OMNIPAQUE IOHEXOL 350 MG/ML SOLN COMPARISON:  Prior CT from 03/03/2019. FINDINGS: CT HEAD FINDINGS Brain: Evolving subacute predominantly right MCA territory infarct involving the right parietotemporal region again seen, relatively stable in size and distribution from previous. Probable associated faint petechial hemorrhage without hemorrhagic transformation or significant mass effect. 10 mm subacute intraparenchymal hemorrhage at the right lentiform nucleus again seen as well, relatively similar in size, but with decreased attenuation as compared to previous. Mild localized edema without significant regional mass effect. No other new acute intracranial hemorrhage. No acute large vessel territory infarct. 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 No mass lesion or midline shift. No hydrocephalus.  No extra-axial fluid collection. Vascular: No hyperdense vessel. Scattered calcified atherosclerosis at the skull base. Skull: Scalp soft tissues and calvarium within normal limits. Sinuses: Trace layering opacity noted within the left sphenoid sinus. Paranasal sinuses are otherwise clear. No mastoid effusion. Orbits: Globes orbital soft tissues demonstrate no acute finding. Remote posttraumatic defect noted at the left orbital floor. Review of the MIP images confirms the above findings CTA NECK FINDINGS Aortic arch: Previously identified intraluminal thrombus protruding into the aortic arch again seen, grossly similar to prior chest CT (series 13, image  323, 328). Moderate atherosclerotic irregularity throughout the visualized arch itself. No hemodynamically significant stenosis seen about the origin of the great vessels. Visualized subclavian arteries widely patent. Right carotid system: Right CCA patent from its origin to the bifurcation without stenosis. Medial is a shin of the right common carotid artery into the retropharyngeal space. Atheromatous irregularity about the right bifurcation/proximal right ICA with associated stenosis of up to 30% by NASCET criteria. Right ICA tortuous but otherwise patent to the skull base without stenosis, dissection or occlusion. Left carotid system: Small amount of intraluminal thrombus seen protruding into the left common carotid artery at its origin (series 13, image 309). Left CCA patent from its origin to the bifurcation without flow-limiting stenosis. Medialization of the left common carotid artery into the retropharyngeal space. Mild atheromatous irregularity about the left carotid bifurcation without hemodynamically significant stenosis (no more than 25%). Left ICA tortuous but otherwise widely patent to the skull base without stenosis, dissection, or occlusion. Vertebral arteries: Both vertebral arteries arise from the subclavian arteries. Vertebral arteries mildly tortuous but widely patent within the neck without stenosis, dissection, or occlusion. Skeleton: No acute osseous abnormality. No discrete osseous lesions. 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 Patient is edentulous. Other neck: No other acute soft tissue abnormality within the neck. Approximate 19 mm left thyroid nodule noted, with additional 1 cm adjacent left thyroid nodule. Right thyroid gland hypoplastic. Upper chest: Visualized upper chest demonstrates no other acute finding. Emphysematous changes noted. Review of the MIP images confirms the above findings CTA HEAD FINDINGS Anterior circulation: Petrous segments widely patent  bilaterally. Scattered atheromatous irregularity within the cavernous/supraclinoid ICAs without hemodynamically significant stenosis. ICA termini well perfused. A1 segments widely patent. Normal anterior communicating artery. Anterior cerebral arteries patent to their distal aspects without stenosis. Right M1 widely patent. Normal right MCA bifurcation. There is apparent abrupt occlusion of a proximal right M3 branch at its origin, best seen on coronal reformatted images (series 14, image 112). Finding consistent with the evolving subacute right MCA territory infarct. Distal right MCA branches otherwise well perfused. Left M1 widely patent. Normal left MCA bifurcation. Distal left MCA branches well perfused. Posterior circulation: Vertebral arteries patent to the vertebrobasilar junction without stenosis. Left vertebral artery dominant. Posterior inferior cerebral arteries patent bilaterally. Basilar patent to its distal aspect without stenosis. Superior cerebral arteries patent bilaterally. Both of the posterior cerebral arteries primarily supplied via the basilar and are well perfused their distal aspects. Venous sinuses: Patent. Anatomic variants: None significant. Review of the MIP images confirms the above findings IMPRESSION: CT HEAD IMPRESSION: 1. Normal expected interval evolution of subacute right MCA territory infarcts, stable in size and distribution as compared to previous exams. 2. 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 No evidence for interval re-bleeding or significant mass effect. 3. Focal area of confluent hypodensity involving the high parasagittal right  frontal lobe, stable from previous. Finding is indeterminate, and could reflect sequelae of chronic ischemia or other insult. Changes related to underlying PML could also be considered given the underlying history of HIV and  immunocompromised state. CTA HEAD AND NECK IMPRESSION: 1. 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 2. 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 Finding could serve as an embolic source. 3. 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 4. Mild to moderate scattered atheromatous change elsewhere about the major arterial vasculature of the head and neck. No other hemodynamically significant or correctable stenosis identified. Electronically Signed   By: Jeannine Boga M.D.   On: 03/06/2019 03:59   Dg Chest 2 View  Result Date: 03/01/2019 CLINICAL DATA:  Anterior chest pain post fall. EXAM: CHEST - 2 VIEW COMPARISON:  February 14, 2019 FINDINGS: Cardiomediastinal silhouette is normal. Mediastinal contours appear intact. Tortuosity of the aorta. There is no evidence of pleural effusion or pneumothorax. Linear opacities in the left mid thorax, possibly posttreatment or postsurgical changes. No radiographic evidence of displaced rib fractures. Soft tissues are grossly normal. IMPRESSION: 1. Linear opacities in the left mid thorax, possibly posttreatment or postsurgical changes. 2. No radiographic evidence of displaced rib fractures. Electronically Signed   By: Fidela Salisbury M.D.   On: 03/01/2019 14:48   Ct Head Wo Contrast  Result Date: 03/03/2019 CLINICAL DATA:  Focal neuro deficit. Unresponsive. 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 EXAM: CT HEAD WITHOUT CONTRAST TECHNIQUE: Contiguous axial images were obtained from the base of the skull through the vertex without intravenous contrast. COMPARISON:  Head CT 03/02/2019 FINDINGS: Brain: A 10 mm hemorrhage in the right lentiform nucleus with mild surrounding edema is unchanged. Additional patchy areas of hypoattenuation involving  cortex and white matter in the right temporal lobe, inferior right parietal lobe, and posteromedial right frontal lobe appears slightly more well-defined than on the prior study. No new intracranial hemorrhage, definite new infarct, midline shift, or extra-axial fluid collection is identified. The ventricles are normal in size. Background chronic small vessel ischemic changes are present in the cerebral white matter bilaterally. Vascular: Calcified atherosclerosis at the skull base. No hyperdense vessel. Skull: No acute fracture or focal osseous lesion. Sinuses/Orbits: Old left orbital floor fracture. Mild right periorbital soft tissue swelling. Mild mucosal thickening in the paranasal sinuses. Clear mastoid air cells. Other: None. IMPRESSION: Evolving acute right cerebral infarcts primarily in the MCA territory. Unchanged 10 mm right basal ganglia hemorrhage. Electronically Signed   By: Logan Bores M.D.   On: 03/03/2019 05:21   Ct Head Wo Contrast  Result Date: 03/02/2019 CLINICAL DATA:  [place] out of bed. EXAM: CT HEAD WITHOUT CONTRAST TECHNIQUE: Contiguous axial images were obtained from the base of the skull through the vertex without intravenous contrast. COMPARISON:  MR brain dated Nov 22, 2013. FINDINGS: Brain: There is hypodensity and loss of the gray-white matter differentiation predominantly involving the right temporal lobe, consistent with acute infarct. 8 x 8 x 10 mm area of hemorrhage in the right basal ganglia with surrounding edema. No midline shift, hydrocephalus, or extra-axial collection. Vascular: Atherosclerotic vascular calcification of the carotid siphons. No hyperdense vessel. Skull: Normal. Negative for fracture or focal lesion. Sinuses/Orbits: No acute finding. Mild ethmoid air cell mucosal thickening. Other: None. IMPRESSION: 1. Acute right MCA territory ischemic  infarct. 10 mm hemorrhage in the right basal ganglia. No midline shift or hydrocephalus. Critical Value/emergent results were  called by telephone at the time of interpretation on 03/02/2019 at 7:55 am to Dr. Debbe Odea, who verbally acknowledged these results. Electronically Signed   By: Titus Dubin M.D.   On: 03/02/2019 07:59   Ct Angio Neck W Or Wo Contrast  Result Date: 03/06/2019 CLINICAL DATA:  Follow-up examination for acute stroke. EXAM: CT ANGIOGRAPHY HEAD AND NECK TECHNIQUE: Multidetector CT imaging of the head and neck was performed using the standard protocol during bolus administration of intravenous contrast. Multiplanar CT image reconstructions and MIPs were obtained to evaluate the vascular anatomy. Carotid stenosis measurements (when applicable) are obtained utilizing NASCET criteria, using the distal internal carotid diameter as the denominator. CONTRAST:  23mL OMNIPAQUE IOHEXOL 350 MG/ML SOLN COMPARISON:  Prior CT from 03/03/2019. FINDINGS: CT HEAD FINDINGS Brain: Evolving subacute predominantly right MCA territory infarct involving the right parietotemporal region again seen, relatively stable in size and distribution from previous. Probable associated faint petechial hemorrhage without hemorrhagic transformation or significant mass effect. 10 mm subacute intraparenchymal hemorrhage at the right lentiform nucleus again seen as well, relatively similar in size, but with decreased attenuation as compared to previous. Mild localized edema without significant regional mass effect. No other new acute intracranial hemorrhage. No acute large vessel territory infarct. 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 No mass lesion or midline shift. No hydrocephalus. No extra-axial fluid collection. Vascular: No hyperdense vessel. Scattered calcified atherosclerosis at the skull base. Skull: Scalp soft tissues and calvarium within normal limits. Sinuses: Trace layering  opacity noted within the left sphenoid sinus. Paranasal sinuses are otherwise clear. No mastoid effusion. Orbits: Globes orbital soft tissues demonstrate no acute finding. Remote posttraumatic defect noted at the left orbital floor. Review of the MIP images confirms the above findings CTA NECK FINDINGS Aortic arch: Previously identified intraluminal thrombus protruding into the aortic arch again seen, grossly similar to prior chest CT (series 13, image 323, 328). Moderate atherosclerotic irregularity throughout the visualized arch itself. No hemodynamically significant stenosis seen about the origin of the great vessels. Visualized subclavian arteries widely patent. Right carotid system: Right CCA patent from its origin to the bifurcation without stenosis. Medial is a shin of the right common carotid artery into the retropharyngeal space. Atheromatous irregularity about the right bifurcation/proximal right ICA with associated stenosis of up to 30% by NASCET criteria. Right ICA tortuous but otherwise patent to the skull base without stenosis, dissection or occlusion. Left carotid system: Small amount of intraluminal thrombus seen protruding into the left common carotid artery at its origin (series 13, image 309). Left CCA patent from its origin to the bifurcation without flow-limiting stenosis. Medialization of the left common carotid artery into the retropharyngeal space. Mild atheromatous irregularity about the left carotid bifurcation without hemodynamically significant stenosis (no more than 25%). Left ICA tortuous but otherwise widely patent to the skull base without stenosis, dissection, or occlusion. Vertebral arteries: Both vertebral arteries arise from the subclavian arteries. Vertebral arteries mildly tortuous but widely patent within the neck without stenosis, dissection, or occlusion. Skeleton: No acute osseous abnormality. No discrete osseous lesions. Moderate to advanced cervical spondylolysis at C5-6  and C6-7. Patient is edentulous. Other neck: No other acute soft tissue abnormality within the neck. Approximate 19 mm left thyroid nodule  noted, with additional 1 cm adjacent left thyroid nodule. Right thyroid gland hypoplastic. Upper chest: Visualized upper chest demonstrates no other acute finding. Emphysematous changes noted. Review of the MIP images confirms the above findings CTA HEAD FINDINGS Anterior circulation: Petrous segments widely patent bilaterally. Scattered atheromatous irregularity within the cavernous/supraclinoid ICAs without hemodynamically significant stenosis. ICA termini well perfused. A1 segments widely patent. Normal anterior communicating artery. Anterior cerebral arteries patent to their distal aspects without stenosis. Right M1 widely patent. Normal right MCA bifurcation. There is apparent abrupt occlusion of a proximal right M3 branch at its origin, best seen on coronal reformatted images (series 14, image 112). Finding consistent with the evolving subacute right MCA territory infarct. Distal right MCA branches otherwise well perfused. Left M1 widely patent. Normal left MCA bifurcation. Distal left MCA branches well perfused. Posterior circulation: Vertebral arteries patent to the vertebrobasilar junction without stenosis. Left vertebral artery dominant. Posterior inferior cerebral arteries patent bilaterally. Basilar patent to its distal aspect without stenosis. Superior cerebral arteries patent bilaterally. Both of the posterior cerebral arteries primarily supplied via the basilar and are well perfused their distal aspects. Venous sinuses: Patent. Anatomic variants: None significant. Review of the MIP images confirms the above findings IMPRESSION: CT HEAD IMPRESSION: 1. Normal expected interval evolution of subacute right MCA territory infarcts, stable in size and distribution as compared to previous exams. 2. Normal expected interval evolution of 10 mm right basal ganglia hemorrhage,  likely subacute right MCA territory infarct with hemorrhagic transformation. No evidence for interval re-bleeding or significant mass effect. 3. Focal area of confluent hypodensity involving the high parasagittal right frontal lobe, stable from previous. Finding is indeterminate, and could reflect sequelae of chronic ischemia or other insult. 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 CTA HEAD AND NECK IMPRESSION: 1. 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 2. 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 Finding could serve as an embolic source. 3. 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 4. Mild to moderate scattered atheromatous change elsewhere about the major arterial vasculature of the head and neck. No other hemodynamically significant or correctable stenosis identified. Electronically Signed   By: Jeannine Boga M.D.   On: 03/06/2019 03:59   Ct Chest W Contrast  Result Date: 03/01/2019 CLINICAL DATA:  Patient status post fall out of bed at 6 a.m. this morning. Bilateral lower extremity weakness. EXAM: CT CHEST, ABDOMEN, AND PELVIS WITH CONTRAST TECHNIQUE: Multidetector CT imaging of the chest, abdomen and pelvis was performed following the standard protocol during bolus administration of intravenous contrast. CONTRAST:  172mL OMNIPAQUE IOHEXOL 300 MG/ML  SOLN COMPARISON:  CT chest 11/03/2013.  PET CT scan 11/22/2013. FINDINGS: CT CHEST FINDINGS Cardiovascular: Pulmonary embolus are seen in right pulmonary arteries such as on image 47 of series 6 and 64-67 of series 6. Emboli are also seen in the aortic arch such as on images 21 and 22 of series 3 and 69 to 76 of series 7. Heart size is normal. No  pleural or pericardial effusion. Calcific aortic and coronary atherosclerosis. Mediastinum/Nodes: No enlarged mediastinal, hilar, or axillary lymph nodes. Thyroid gland, trachea, and esophagus demonstrate no significant findings. Lungs/Pleura: Lungs are emphysematous. There is scar in the left upper lobe at the site of patient's prior  lung cancer. Mild dependent atelectasis is noted. Musculoskeletal: No acute or focal bony abnormality. CT ABDOMEN PELVIS FINDINGS Hepatobiliary: No focal liver abnormality is seen. No gallstones, gallbladder wall thickening, or biliary dilatation. The liver is low attenuating consistent with fatty infiltration. Pancreas: 0.4 cm hypoattenuating lesion at the junction of the tail and body on image 65 is likely due to some prior inflammatory process. No peripancreatic inflammatory change. No pancreatic duct dilatation. Spleen: Wedge-shaped hypoattenuation in the superior aspect of the spleen measuring 2.7 cm AP x 2.7 cm transverse by 2.0 cm craniocaudal is consistent with infarct. Spleen is otherwise unremarkable. Adrenals/Urinary Tract: The adrenal glands appear normal. Wedge-shaped area peripheral hypoattenuation in the mid to upper pole of the right kidney is compatible with an infarct. More subtle area of decreased cortical attenuation in the more superior aspect of the right kidney is also worrisome for infarct. Left kidney appears normal. Ureters and urinary bladder are unremarkable. Stomach/Bowel: Stomach is within normal limits. Appendix appears normal. No evidence of bowel wall thickening, distention, or inflammatory changes. Vascular/Lymphatic: Aortic atherosclerosis. No enlarged abdominal or pelvic lymph nodes. Reproductive: Uterus and bilateral adnexa are unremarkable. Other: None. Musculoskeletal: No acute or focal abnormality. Degenerative disc disease L5-S1 noted. 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  Findings raise the possibility for ventricular or atrial septal defect. No evidence of trauma. Fatty infiltration of the liver. Atherosclerosis. Emphysema. These results were called by telephone at the time of interpretation on 03/01/2019 at 7:29 pm to [facility], who verbally acknowledged these results. Electronically Signed   By: Inge Rise M.D.   On: 03/01/2019 19:36   Mr Jeri Cos IW Contrast  Result Date: 03/07/2019 CLINICAL DATA:  73 year old female with right hemisphere/right MCA territory infarct detected by head CT since 03/02/2019. Right MCA M3 occlusion on CTA yesterday. History of HIV and lung cancer. EXAM: MRI HEAD WITHOUT AND WITH CONTRAST TECHNIQUE: Multiplanar, multiecho pulse sequences of the brain and surrounding structures were obtained without and with intravenous contrast. CONTRAST:  10 milliliters Gadavist COMPARISON:  CTA head and neck 03/06/2019. Head CTs 03/03/2019 and earlier. Brain MRI 11/22/2013. FINDINGS: Study is intermittently degraded by motion artifact despite repeated imaging attempts. Brain: Confluent 3 centimeter area of restricted diffusion at the junction of the posterior right temporal lobe, anterior right occipital lobe and inferior right parietal lobe corresponding to the dominant area of hypodensity by CT recently. Associated T2 and FLAIR hyperintensity compatible with cytotoxic edema. Superimposed patchy restricted diffusion in the right periatrial white matter, and small oval petechial hemorrhage in the posterior right lentiform with surrounding restricted diffusion (stable and 10 millimeters on series 13, image 13 today). Restricted diffusion in the nearby right corona radiata and posterior right insula. Multiple small cortical and subcortical white matter foci of restricted diffusion elsewhere in the posterior right MCA territory (series 5 images 80 through 85), and also in the right frontal operculum. No restricted diffusion in the contralateral left  hemisphere. Although there is a questionable punctate area of acute to subacute diffusion restriction in the right cerebellum on series 5, image 55. No other blood products identified. Abnormal T2 and FLAIR hyperintensity in the medial superior right frontal gyrus new since 2015 and seen by CT is facilitated on diffusion, but is associated with nodular heterogeneous enhancement which appears to be partially extra-axial (series 9, image 11 and series 16, image 25), although only 1 postcontrast sequence could be obtained. This extra-axial component  of the lesion is dark on T2 imaging, and the underlying gyrus is mildly expanded. This is new since 2015. Elsewhere there is post ischemic enhancement with some intrinsic T1 hyperintense blood products in the right lentiform. There is mild post ischemic enhancement at the most confluent area of restricted diffusion. There is also punctate enhancement suggested at the right frontal operculum on series 16, image 19 which is probably corresponding to a small cortical infarct on series 5, image 83. No other abnormal intracranial enhancement is identified. No superimposed midline shift, ventriculomegaly. The left deep gray nuclei and brainstem appear to remain normal. Mild scattered chronic cerebral white matter T2 and FLAIR hyperintensity elsewhere is stable since 2015. Cervicomedullary junction and pituitary are within normal limits. Vascular: Major intracranial vascular flow voids are stable since 2015. Skull and upper cervical spine: Nonspecific decreased T1 signal in the upper cervical vertebrae since 2015. No suspicious skull bone marrow signal changes, including overlying the right superior frontal gyrus lesion. Sinuses/Orbits: Chronic left orbital floor fracture, stable. Other: Mastoids are clear. IMPRESSION: 1. Nodular and enhancing right superior frontal gyrus and/or extra-axial mass measuring about 15 mm with mild regional vasogenic edema is new since the 2015 MRI.  Only 1 motion degraded post-contrast sequence could be obtained today. Given this patient's medical history differential considerations in lung cancer metastasis, HIV related infection, CNS lymphoma, or less likely meningioma. A repeat Brain MRI without and with contrast when the patient can better cooperate might refine the differential diagnosis. 2. Scattered acute infarcts in the right hemisphere as on CT, mostly in the right MCA territory. Stable associated petechial hemorrhage in the posterior right lentiform. Possible punctate infarct in the right cerebellum, but no left-side vascular territory involvement. 3. No significant intracranial mass effect. Electronically Signed   By: Genevie Ann M.D.   On: 03/07/2019 06:24   Ct Abdomen Pelvis W Contrast  Result Date: 03/01/2019 CLINICAL DATA:  Patient status post fall out of bed at 6 a.m. this morning. Bilateral lower extremity weakness. EXAM: CT CHEST, ABDOMEN, AND PELVIS WITH CONTRAST TECHNIQUE: Multidetector CT imaging of the chest, abdomen and pelvis was performed following the standard protocol during bolus administration of intravenous contrast. CONTRAST:  154mL OMNIPAQUE IOHEXOL 300 MG/ML  SOLN COMPARISON:  CT chest 11/03/2013.  PET CT scan 11/22/2013. FINDINGS: CT CHEST FINDINGS Cardiovascular: Pulmonary embolus are seen in right pulmonary arteries such as on image 42 of series 6 and 64-67 of series 6. Emboli are also seen in the aortic arch such as on images 21 and 22 of series 3 and 69 to 76 of series 7. Heart size is normal. No pleural or pericardial effusion. Calcific aortic and coronary atherosclerosis. Mediastinum/Nodes: No enlarged mediastinal, hilar, or axillary lymph nodes. Thyroid gland, trachea, and esophagus demonstrate no significant findings. Lungs/Pleura: Lungs are emphysematous. There is scar in the left upper lobe at the site of patient's prior lung cancer. Mild dependent atelectasis is noted. Musculoskeletal: No acute or focal bony  abnormality. CT ABDOMEN PELVIS FINDINGS Hepatobiliary: No focal liver abnormality is seen. No gallstones, gallbladder wall thickening, or biliary dilatation. The liver is low attenuating consistent with fatty infiltration. Pancreas: 0.4 cm hypoattenuating lesion at the junction of the tail and body on image 65 is likely due to some prior inflammatory process. No peripancreatic inflammatory change. No pancreatic duct dilatation. Spleen: Wedge-shaped hypoattenuation in the superior aspect of the spleen measuring 2.7 cm AP x 2.7 cm transverse by 2.0 cm craniocaudal is consistent with infarct. Spleen is otherwise unremarkable.  Adrenals/Urinary Tract: The adrenal glands appear normal. Wedge-shaped area peripheral hypoattenuation in the mid to upper pole of the right kidney is compatible with an infarct. More subtle area of decreased cortical attenuation in the more superior aspect of the right kidney is also worrisome for infarct. Left kidney appears normal. Ureters and urinary bladder are unremarkable. Stomach/Bowel: Stomach is within normal limits. Appendix appears normal. No evidence of bowel wall thickening, distention, or inflammatory changes. Vascular/Lymphatic: Aortic atherosclerosis. No enlarged abdominal or pelvic lymph nodes. Reproductive: Uterus and bilateral adnexa are unremarkable. Other: None. Musculoskeletal: No acute or focal abnormality. Degenerative disc disease L5-S1 noted. 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 Findings raise the possibility for ventricular or atrial septal defect. No evidence of trauma. Fatty infiltration of the liver. Atherosclerosis. Emphysema. These results were called by telephone at the time of interpretation on 03/01/2019 at 7:29 pm to [facility], [facility], who verbally acknowledged these results. Electronically Signed   By: Inge Rise M.D.   On: 03/01/2019 19:36   Dg Chest Port 1 View  Result Date:  03/03/2019 CLINICAL DATA:  Respiratory failure EXAM: PORTABLE CHEST 1 VIEW COMPARISON:  03/01/2019 FINDINGS: Cardiac shadow is mildly enlarged but stable. Aortic calcifications are again seen. The lungs are well aerated bilaterally. Increased density in the lateral aspect of the right lung base is noted likely related to the patient's known pulmonary emboli. Irregularity of the fifth and sixth ribs on the left laterally is seen. These were not present on prior chest x-rays from 2016 and likely related to the patient's known history of prior lung carcinoma with mild healing. No acute fracture is seen. IMPRESSION: Slight increase in parenchymal opacity in the right lung base laterally likely related to the known underlying pulmonary emboli. Old deformities of the fifth and sixth ribs on the left likely related to the patient's known history of prior lung carcinoma. Electronically Signed   By: Inez Catalina M.D.   On: 03/03/2019 06:10   Dg Chest Port 1 View  Result Date: 02/14/2019 CLINICAL DATA:  Shortness of breath EXAM: PORTABLE CHEST 1 VIEW COMPARISON:  02/13/2019 FINDINGS: Cardiac shadow is stable. The lungs are well aerated bilaterally. No focal infiltrate or sizable effusion is noted. No bony abnormality is seen. IMPRESSION: No acute abnormality noted. Electronically Signed   By: Inez Catalina M.D.   On: 02/14/2019 07:51   Dg Chest Port 1 View  Result Date: 02/13/2019 CLINICAL DATA:  Shortness of breath. Respiratory failure and hypoxia. EXAM: PORTABLE CHEST 1 VIEW COMPARISON:  02/12/2019 FINDINGS: Prominent main pulmonary artery. Mild enlargement of the cardiopericardial silhouette indistinct opacity along the lingula. Left midlung scarring. Peripheral left basilar bandlike density. The right lung appears clear. Chronic deformity of the left lateral fifth and sixth ribs. IMPRESSION: 1. New indistinct lingular opacity peripherally. This could be from a pleural process or airspace opacity. 2. Continued  peripheral scarring in the left mid lung with adjacent chronic rib deformities. 3. Mild enlargement of the cardiopericardial silhouette, without edema. 4. Prominent main pulmonary artery suggesting pulmonary arterial hypertension. Electronically Signed   By: Van Clines M.D.   On: 02/13/2019 08:03   Dg Chest Port 1 View  Result Date: 02/12/2019 CLINICAL DATA:  Acute chronic respiratory failure. EXAM: PORTABLE CHEST 1 VIEW COMPARISON:  02/11/2019 FINDINGS: Heart size is normal. No pleural effusion or edema. Stable chronic scarring within the left midlung. No superimposed airspace consolidation. IMPRESSION: 1. No active disease.  Unchanged left midlung scarring. Electronically Signed   By: Kerby Moors M.D.   On: 02/12/2019 08:28   Dg Chest Port 1 View  Result Date: 02/11/2019 CLINICAL DATA:  Shortness of breath. EXAM: PORTABLE CHEST 1 VIEW COMPARISON:  Chest x-rays dated 03/29/2015 and 01/11/2012. FINDINGS: Borderline cardiomegaly is stable. Fullness of the LEFT aortopulmonary window region, most likely a prominent main pulmonary artery suggesting chronic pulmonary artery hypertension. Increased interstitial prominence bilaterally, bibasilar predominant, LEFT slightly greater than RIGHT, presumably mild edema superimposed on chronic interstitial lung disease. No confluent opacity to suggest a consolidating pneumonia. No pleural effusion or pneumothorax seen. Osseous structures about the chest are unremarkable. IMPRESSION: 1. Probable mild bilateral interstitial edema superimposed on chronic interstitial lung disease. 2. No evidence of consolidating pneumonia or alveolar pulmonary edema. 3. Probable chronic pulmonary artery hypertension. 4. Stable borderline cardiomegaly. Electronically Signed   By: Franki Cabot M.D.   On: 02/11/2019 18:12   Dg Hips Bilat W Or Wo Pelvis 3-4 Views  Result Date: 03/01/2019 CLINICAL DATA:  Bilateral hip pain after fall today. EXAM: DG HIP (WITH OR WITHOUT PELVIS)  3-4V BILAT COMPARISON:  None. FINDINGS: There is no evidence of hip fracture or dislocation. There is no evidence of arthropathy or other focal bone abnormality. IMPRESSION: Negative. Electronically Signed   By: Marijo Conception M.D.   On: 03/01/2019 14:45   Vas US Carotid (at [facility] Only)  Result Date: 03/02/2019 Carotid Arterial Duplex Study Indications:       CVA. Risk Factors:      None. Limitations        Today's exam was limited due to the body habitus of the                    patient, patient movement, patient positioning and the                    patient's respiratory variation. Comparison Study:  No prior studies. Performing Technologist: Oliver Hum RVT  Examination Guidelines: A complete evaluation includes B-mode imaging, spectral Doppler, color Doppler, and power Doppler as needed of all accessible portions of each vessel. Bilateral testing is considered an integral part of a complete examination. Limited examinations for reoccurring indications may be performed as noted.  Right Carotid Findings: +----------+--------+--------+--------+-----------------------+--------+           PSV cm/sEDV cm/sStenosisPlaque Description     Comments +----------+--------+--------+--------+-----------------------+--------+ CCA Prox  68      13              smooth and heterogenous         +----------+--------+--------+--------+-----------------------+--------+ CCA Distal65      11              smooth and heterogenous         +----------+--------+--------+--------+-----------------------+--------+ ICA Prox  58      14              smooth and heterogenoustortuous +----------+--------+--------+--------+-----------------------+--------+ ICA Distal57      22                                     tortuous +----------+--------+--------+--------+-----------------------+--------+ ECA       60      11                                               +----------+--------+--------+--------+-----------------------+--------+ +----------+--------+-------+--------+-------------------+  PSV cm/sEDV cmsDescribeArm Pressure (mmHG) +----------+--------+-------+--------+-------------------+ Subclavian111                                        +----------+--------+-------+--------+-------------------+ +---------+--------+--+--------+--+---------+ VertebralPSV cm/s73EDV cm/s24Antegrade +---------+--------+--+--------+--+---------+  Left Carotid Findings: +----------+--------+--------+--------+-----------------------+--------+           PSV cm/sEDV cm/sStenosisPlaque Description     Comments +----------+--------+--------+--------+-----------------------+--------+ CCA Prox  101     19              smooth and heterogenous         +----------+--------+--------+--------+-----------------------+--------+ CCA Distal79      16              smooth and heterogenous         +----------+--------+--------+--------+-----------------------+--------+ ICA Prox  80      25              smooth and heterogenoustortuous +----------+--------+--------+--------+-----------------------+--------+ ICA Distal60      29                                     tortuous +----------+--------+--------+--------+-----------------------+--------+ ECA       66                                                      +----------+--------+--------+--------+-----------------------+--------+ +----------+--------+--------+--------+-------------------+           PSV cm/sEDV cm/sDescribeArm Pressure (mmHG) +----------+--------+--------+--------+-------------------+ [id number]                                         +----------+--------+--------+--------+-------------------+ +---------+--------+--+--------+--+---------+ VertebralPSV cm/s72EDV cm/s27Antegrade +---------+--------+--+--------+--+---------+  Summary: Right Carotid:  Velocities in the right ICA are consistent with a 1-39% stenosis. Left Carotid: Velocities in the left ICA are consistent with a 1-39% stenosis. Vertebrals: Bilateral vertebral arteries demonstrate antegrade flow. *See table(s) above for measurements and observations.  Electronically signed by Antony Contras MD on 03/02/2019 at 1:16:38 PM.    Final    Vas [name] Lower Extremity Venous (dvt)  Result Date: 03/02/2019  Lower Venous Study Indications: Pulmonary embolism, and stroke.  Risk Factors: None identified. Limitations: Body habitus, poor ultrasound/tissue interface and patient movement, patient positioning. Comparison Study: No prior studies. Performing Technologist: Oliver Hum RVT  Examination Guidelines: A complete evaluation includes B-mode imaging, spectral Doppler, color Doppler, and power Doppler as needed of all accessible portions of each vessel. Bilateral testing is considered an integral part of a complete examination. Limited examinations for reoccurring indications may be performed as noted.  +---------+---------------+---------+-----------+----------+--------------+ RIGHT    CompressibilityPhasicitySpontaneityPropertiesThrombus Aging +---------+---------------+---------+-----------+----------+--------------+ CFV      Full           Yes      Yes                                 +---------+---------------+---------+-----------+----------+--------------+ SFJ      Full                                                        +---------+---------------+---------+-----------+----------+--------------+  FV Prox  Full                                                        +---------+---------------+---------+-----------+----------+--------------+ FV Mid   Full                                                        +---------+---------------+---------+-----------+----------+--------------+ FV DistalFull                                                         +---------+---------------+---------+-----------+----------+--------------+ PFV      Full                                                        +---------+---------------+---------+-----------+----------+--------------+ POP      Full           Yes      Yes                                 +---------+---------------+---------+-----------+----------+--------------+ PTV      Full                                                        +---------+---------------+---------+-----------+----------+--------------+ PERO     Full                                                        +---------+---------------+---------+-----------+----------+--------------+   +---------+---------------+---------+-----------+----------+--------------+ LEFT     CompressibilityPhasicitySpontaneityPropertiesThrombus Aging +---------+---------------+---------+-----------+----------+--------------+ CFV      Full           Yes      Yes                                 +---------+---------------+---------+-----------+----------+--------------+ SFJ      Full                                                        +---------+---------------+---------+-----------+----------+--------------+ FV Prox  Full                                                        +---------+---------------+---------+-----------+----------+--------------+  FV Mid   Full                                                        +---------+---------------+---------+-----------+----------+--------------+ FV DistalFull                                                        +---------+---------------+---------+-----------+----------+--------------+ PFV      Full                                                        +---------+---------------+---------+-----------+----------+--------------+ POP      Full           Yes      Yes                                  +---------+---------------+---------+-----------+----------+--------------+ PTV      Full                                                        +---------+---------------+---------+-----------+----------+--------------+ PERO     Full                                                        +---------+---------------+---------+-----------+----------+--------------+     Summary: Right: There is no evidence of deep vein thrombosis in the lower extremity. No cystic structure found in the popliteal fossa. Left: There is no evidence of deep vein thrombosis in the lower extremity. No cystic structure found in the popliteal fossa.  *See table(s) above for measurements and observations. Electronically signed by Servando Snare MD on 03/02/2019 at 3:40:30 PM.    Final    [name] Ekg Site Rite  Result Date: 03/03/2019 If Site Rite image not attached, placement could not be confirmed due to current cardiac rhythm.    PHYSICAL EXAM     Temp:  [97.4 F (36.3 C)-99.2 F (37.3 C)] 97.4 F (36.3 C) (09/08 1131) Pulse Rate:  [95-107] 102 (09/08 1131) Resp:  [14-24] 23 (09/08 1131) BP: (125-159)/(76-86) 125/76 (09/08 1131) SpO2:  [93 %-100 %] 93 % (09/08 1131)  General - Well nourished, well developed, not in distress.  Ophthalmologic - fundi not visualized due to noncooperation.  Cardiovascular - Regular rate and rhythm.  Neuro - awake alert, orientated to place, people, time and self. No aphasia, able to name and repeat. Slight dysarthria. PERRL, EOMI, no neglect. Visual field full. Left facial nasolabial fold flattening. Tongue midline. LUE and LLE 4/5 and RUE and RLE 5/5. Sensation symmetrical. FTN intact bilaterally. Gait not tested.   ASSESSMENT/PLAN Ms. [name] is a  73 y.o. female with history of non-small cell lung cancer s/p radiation therapy, HIV, Hep B, COPD, HLD, CKD stg 3, and legally blind who presented with fall, generalized weakness, and chest pain, found to have embolic infarcts  in R MCA territory with subacute hemorrhagic component along with PE and prominent thrombus in aortic arch.  Stroke: Acute R MCA territory infarcts, with hemorrhagic conversion at BG, embolic pattern concerning for PFO especially in setting of PE vs. Hypercoagulable state given new brain mass and hx of cancer  CT head - Acute right MCA territory ischemic infarct. 10 mm hemorrhage in the right basal ganglia. No midline shift or hydrocephalus.  MRI right MCA infarcts and HT at right BG  CTA head and neck right M3 occlusion, again seen aortic arch thrombus  2D Echo EF > 65%, moderate pulmonary HTN, no PFO seen  Carotid Doppler unremarkable  LE venous Doppler negative for DVT  TEE pending tomorrow  LDL 48  HgbA1c 6.4   Hypercoagulable work-up - 03/01/19 - so far negative (protein S low 52), but some still pending  UDS - 03/04/19 - negative  IV heparin for VTE ppx  ASA 81 prior to admission, now on eliquis due to PE, aortic arch thrombus and embolism.   Therapy recommendations:  SNF  Disposition: pending  Embolism - Pulmonary, Aortic  CT chest showed primary emboli and emboli in aortic arch  CT abdomen pelvis showed right renal infarcts as well as splenic infarct.  No cancer  Right MCA territory infarct  LE venous Doppler negative for DVT  Concerning for PFO related embolism in the setting of PE  TEE tomorrow to rule out PFO and endocarditis in the pt with hx of HIV and substance abuse  Acute hypercapnic respiratory failure  rapid response was called 9/4 early am for sudden mental status change  BP in 50s with agonal breathing  ABG PCO2 80  History of COPD  Treated with BiPAP, much improved  Management as per primary team  HIV  CD4 count pending  Viral load 30  CD4 on 02/13/2019 was 406  Low suspicious for PML given low viral load and no significant cerebral edema on CT  Brain mass  MRI brain with and without contrast Nodular and enhancing right  superior frontal gyrus and/or extra-axial mass with mild regional vasogenic edema is new since the 2015 MRI.  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 Pt agitation and restless and body aches ? part of the substance withdraw syndrome  Substance cessation education will be provided  Tobacco abuse / COPD  Current smoker  Smoking cessation counseling will be provided  Other Stroke Risk Factors  Advanced age  Obesity, Body mass index is 32.78 kg/m., recommend weight loss, diet and exercise as appropriate    LLL cancer T1b s/p radiation  Other active issues  Acute thrombocytopenia - platelet 121->175  Neuropathy in legs  CKD stage 2-3Severe COPD  +UA / UCx > 100K aerococcus  Hepatitis B  Legally blind  Agitation - Zyprexa per Palliative Care. (also has prn Haldol available)  Pain - Oxycodone, gabapentin and Robaxin per [facility] day #6  Rosalin Hawking, MD PhD Stroke Neurology  03/07/2019 2:01 PM   To contact Stroke Continuity provider, please refer to [email or web address] After hours, contact General Neurology

## 2019-03-07 NOTE — Anesthesia Preprocedure Evaluation (Addendum)
Anesthesia Evaluation  Patient identified by MRN, date of birth, ID band Patient awake    Reviewed: Allergy & Precautions, NPO status , Patient's Chart, lab work & pertinent test results  Airway Mallampati: II  TM Distance: >3 FB Neck ROM: Full    Dental no notable dental hx. (+) Teeth Intact   Pulmonary asthma , pneumonia, Current Smoker,    Pulmonary exam normal breath sounds clear to auscultation       Cardiovascular hypertension, Pt. on medications Normal cardiovascular exam Rhythm:Regular Rate:Normal     Neuro/Psych  Neuromuscular disease CVA negative psych ROS   GI/Hepatic negative GI ROS, Neg liver ROS,   Endo/Other  negative endocrine ROS  Renal/GU Renal disease     Musculoskeletal   Abdominal   Peds  Hematology negative hematology ROS (+)   Anesthesia Other Findings   Reproductive/Obstetrics negative OB ROS                            Anesthesia Physical Anesthesia Plan  ASA: III  Anesthesia Plan: MAC   Post-op Pain Management:    Induction: Intravenous  PONV Risk Score and Plan: Treatment may vary due to age or medical condition  Airway Management Planned: Natural Airway and Nasal Cannula  Additional Equipment:   Intra-op Plan:   Post-operative Plan:   Informed Consent: I have reviewed the patients History and Physical, chart, labs and discussed the procedure including the risks, benefits and alternatives for the proposed anesthesia with the patient or authorized representative who has indicated his/her understanding and acceptance.     Dental advisory given  Plan Discussed with:   Anesthesia Plan Comments:        Anesthesia Quick Evaluation

## 2019-03-07 NOTE — Discharge Instructions (Signed)
Information on my medicine - ELIQUIS (apixaban)  This medication education was reviewed with me or my healthcare representative as part of my discharge preparation.    Why was Eliquis prescribed for you? Eliquis was prescribed to treat blood clots that may have been found in the veins of your legs (deep vein thrombosis) or in your lungs (pulmonary embolism) and to reduce the risk of them occurring again.  What do You need to know about Eliquis ? The starting dose is 10 mg (two 5 mg tablets) taken TWICE daily for the FIRST SEVEN (7) DAYS, then on (enter date)  03/13/19 PM  the dose is reduced to ONE 5 mg tablet taken TWICE daily.  Eliquis may be taken with or without food.   Try to take the dose about the same time in the morning and in the evening. If you have difficulty swallowing the tablet whole please discuss with your pharmacist how to take the medication safely.  Take Eliquis exactly as prescribed and DO NOT stop taking Eliquis without talking to the doctor who prescribed the medication.  Stopping may increase your risk of developing a new blood clot.  Refill your prescription before you run out.  After discharge, you should have regular check-up appointments with your healthcare provider that is prescribing your Eliquis.    What do you do if you miss a dose? If a dose of ELIQUIS is not taken at the scheduled time, take it as soon as possible on the same day and twice-daily administration should be resumed. The dose should not be doubled to make up for a missed dose.  Important Safety Information A possible side effect of Eliquis is bleeding. You should call your healthcare provider right away if you experience any of the following: ? Bleeding from an injury or your nose that does not stop. ? Unusual colored urine (red or dark brown) or unusual colored stools (red or black). ? Unusual bruising for unknown reasons. ? A serious fall or if you hit your head (even if there is no  bleeding).  Some medicines may interact with Eliquis and might increase your risk of bleeding or clotting while on Eliquis. To help avoid this, consult your healthcare provider or pharmacist prior to using any new prescription or non-prescription medications, including herbals, vitamins, non-steroidal anti-inflammatory drugs (NSAIDs) and supplements.  This website has more information on Eliquis (apixaban): http://[email or web address].[email or web address]/home

## 2019-03-07 NOTE — Progress Notes (Signed)
PROGRESS NOTE    [name]   XBJ:[id number]  DOB: 07/22/1945  DOA: 03/01/2019 PCP: System, Provider Not In   Brief Narrative:  [name] is a 73 y.o. 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 Patient had noted generalized weakness and leg pain for a number of days and then fell out of the bed around 6 AM and laid on the floor until her son found her about 6 hours later.  She was too weak to ambulate.  She noted right-sided lower rib pain after the fall.  The patient underwent a CT of the chest abdomen pelvis in the ED.  Results showed pulmonary emboli and emboli in the aortic arch with a right renal infarcts and a splenic infarct.  Also noted is a fatty liver and emphysema. She was started on a heparin infusion.  She subsequently had a CT scan of her head which was ordered by the admitting doctor and showed acute right MCA territory ischemic infarct. 10 mm hemorrhage in the right basal ganglia  She was recently admitted from 8/15-8/21 for acute respiratory failure due to COPD and overmedications and her Robaxin, Gabapentin and Seroquel. She required a BiPAP. She was discharged home on 4 L O2 and a CPAP.  Subjective: Alert today. No complaints.     Assessment & Plan:   Principal Problem: Acute hypercapnic respiratory failure- found lethargic and hypercapnic on 9/7 - may be due to narcotics, progression of CVA in setting of PEs or COPD flare - needed > 12 hrs of BiPAP but improved -   added low dose solumedrol but weaned off on 9/7 -9/6 >> weaned her off of O2 this AM- pulse ox is 96% - on 2 L again today- sounds congested- I have asked RN to give her a neb.  CVA (cerebral vascular accident)  - left facial droop, mild weakness on left side with subjective decreased  sensation - CVA may be embolic due to aortic thrombus? Neuro recommended Heparin infusion - Her 2D echo does not show a PFO - will need SNF based on PT/OT evals - passed swallow eval -CTA: 1 Abrupt proximal right M3 occlusion as above, consistent with evolving subacute right MCA territory infarcts. 2. 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 Finding could serve as an embolic source. 3. 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 4. Mild to moderate scattered atheromatous change elsewhere about the major arterial vasculature of the head and neck. No other hemodynamically significant or correctable stenosis identified. - Hypercoagulable work up ordered - Cardiolipin neg, Pro C normal, B2 glycoprotein neg, Homocysteine normal, Pro S & C normal Antithrombin III normal- still awaiting the rest  Acute encephalopathy - having on and off confusion - ie. after pain meds was getting lethargic- Palliative care consulted and family decided to make her comfortable with pain meds and DNR, no full comfort    PE (pulmonary thromboembolism) -acute respiratory failure - on 9/3, in ED,  patient was quite hypoxic and requiring 4 L of oxygen - Bilateral lower extremity venous duplex is negative for DVT - heparin switched to Eliquis by neuro    Aortic thrombus  - Eliquis  Acute thrombocytopenia - Platelets are  around 120s today-continue to follow-  may be secondary to extensive thrombosis- normal today   DM 2 - Hb A1c 6.4-not checking CBCs at this moment    Human immunodeficiency virus (HIV) disease  -Last CD4 count was 406 on 02/13/2019 -Continue Atripla  Neuropathy in legs- chronic back pain -  She states she was taking Tizanidine, Gabapentin, Tylenol     Cigarette smoker - COPD (chronic obstructive pulmonary disease)   - advised to  stop smoking- was smoking 1/2 ppd at home     Non-small cell lung cancer   - s/p radiation    CKD (chronic kidney disease), stage II- III    - stable  + UA/ urine culture > 100K Aerococcus - she has not complained of dysuria- follow off of antibitoics  Time spent in minutes: 35 DVT prophylaxis: SCDs Code Status: DNR per family Family Communication: discussed plan in detail with brother Gerarda Conklin Disposition Plan: cont to follow in hospital- appreciate palliative care consult- family wants SNF- no bed yet- needs TEE tomorrow Consultants:   Neuro Procedures:   2D echo 1. The left ventricle has hyperdynamic systolic function, with an ejection fraction of >65%. The cavity size was normal. There is mildly increased left ventricular wall thickness. Left ventricular diastolic Doppler parameters are consistent with impaired relaxation. 2. The right ventricle has normal systolic function. The cavity was normal. 3. The mitral valve is abnormal. Mild thickening of the mitral valve leaflet. 4. The tricuspid valve is grossly normal. 5. The aortic valve is tricuspid. Mild calcification of the aortic valve. No stenosis of the aortic valve. 6. The aorta is normal unless otherwise noted. 7. Vigorous LV systolic function; mild LVH; grade 1 diastolic dysfunction; trace TR; moderate pulmonary hypertension Antimicrobials:  Anti-infectives (From admission, onward)   Start     Dose/Rate Route Frequency Ordered Stop   03/05/19 2200  efavirenz-emtricitabine-tenofovir (ATRIPLA) 600-200-300 MG per tablet 1 tablet     1 tablet Oral Daily at bedtime 03/05/19 1003     03/01/19 2215  efavirenz-emtricitabine-tenofovir (ATRIPLA) 600-200-300 MG per tablet 1 tablet  Status:  Discontinued     1 tablet Oral Daily at bedtime 03/01/19 2206 03/03/19 1641       Objective: Vitals:   03/06/19 2146 03/06/19 2212 03/07/19 0000 03/07/19 0739  BP:   135/76 (!) 151/81  Pulse: (!) 101   100  Resp:  19  (!)  21  Temp:   98.4 F (36.9 C) 99.2 F (37.3 C)  TempSrc:   Oral Axillary  SpO2:  100%  93%  Weight:      Height:        Intake/Output Summary (Last 24 hours) at 03/07/2019 1012 Last data filed at 03/06/2019 2207 Gross per 24 hour  Intake 50 ml  Output 300 ml  Net -250 ml   Filed Weights   03/01/19 2000  Weight: 100.7 kg    Examination: General exam: Appears comfortable  HEENT: PERRLA, oral mucosa moist, no sclera icterus or thrush Respiratory system: Clear to auscultation. Respiratory effort normal. Cardiovascular system: S1 & S2 heard,  No murmurs  Gastrointestinal system: Abdomen soft, non-tender, nondistended. Normal bowel sounds   Central nervous system: Alert and oriented to place and person today. No focal neurological deficits. Extremities: No cyanosis, clubbing or edema Skin: No rashes or ulcers Psychiatry:  Mood & affect appropriate.   Data Reviewed: I have personally reviewed following labs and imaging studies  CBC: Recent Labs  Lab 03/01/19 1330  03/02/19  [id number] 03/04/19 0332 03/05/19 0523 03/06/19 0412 03/07/19 0411  WBC 11.6*   < > 9.6 6.9 7.4 6.2 8.8  NEUTROABS 8.0*  --   --   --   --   --   --   HGB 15.1*   < > 14.4 13.3 13.0 15.9* 14.8  HCT 55.0*   < > 53.7* 50.0* 47.1* 57.7* 53.1*  MCV 86.2   < > 88.5 89.1 85.6 84.9 85.0  PLT 149*   < > 137* 115* 128* 121* 175   < > = values in this interval not displayed.   Basic Metabolic Panel: Recent Labs  Lab 03/01/19 1330 03/02/19 0525 03/03/19 1625 03/04/19 0332 03/07/19 0411  NA 140 137 142 140 143  K 5.0 3.5 3.9 4.2 3.2*  CL 102 97* 105 104 102  CO2 23 26 29 25 31   GLUCOSE 89 80 89 96 107*  BUN 13 10 8 8 8   CREATININE 1.31* 1.04* 0.86 0.85 0.69  CALCIUM 8.6* 7.9* 8.2* 8.2* 9.2   GFR: Estimated Creatinine Clearance: 81.5 mL/min (by C-G formula based on SCr of 0.69 mg/dL). Liver Function Tests: Recent Labs  Lab 03/01/19 1330  AST 73*  ALT 25  ALKPHOS 66  BILITOT 2.0*  PROT 7.6  ALBUMIN  3.2*   No results for input(s): LIPASE, AMYLASE in the last 168 hours. No results for input(s): AMMONIA in the last 168 hours. Coagulation Profile: No results for input(s): INR, PROTIME in the last 168 hours. Cardiac Enzymes: Recent Labs  Lab 03/01/19 1330  CKTOTAL 382*   BNP (last 3 results) No results for input(s): PROBNP in the last 8760 hours. HbA1C: No results for input(s): HGBA1C in the last 72 hours. CBG: Recent Labs  Lab 03/02/19 0644 03/03/19 0358 03/03/19 0546  GLUCAP 102* 104* 98   Lipid Profile: No results for input(s): CHOL, HDL, LDLCALC, TRIG, CHOLHDL, LDLDIRECT in the last 72 hours. Thyroid Function Tests: No results for input(s): TSH, T4TOTAL, FREET4, T3FREE, THYROIDAB in the last 72 hours. Anemia Panel: No results for input(s): VITAMINB12, FOLATE, FERRITIN, TIBC, IRON, RETICCTPCT in the last 72 hours. Urine analysis:    Component Value Date/Time   COLORURINE AMBER (A) 03/01/2019 1721   APPEARANCEUR CLOUDY (A) 03/01/2019 1721   LABSPEC 1.023 03/01/2019 1721   PHURINE 5.0 03/01/2019 1721   GLUCOSEU NEGATIVE 03/01/2019 1721   HGBUR NEGATIVE 03/01/2019 1721   BILIRUBINUR NEGATIVE 03/01/2019 1721   KETONESUR 80 (A) 03/01/2019 1721   PROTEINUR 100 (A) 03/01/2019 1721   UROBILINOGEN 0.2 03/28/2015 1227   NITRITE NEGATIVE 03/01/2019 1721   LEUKOCYTESUR NEGATIVE 03/01/2019 1721   Sepsis Labs: @LABRCNTIP (procalcitonin:4,lacticidven:4) ) Recent Results (from the past 240 hour(s))  Urine culture     Status: Abnormal   Collection Time: 03/01/19  5:10 PM   Specimen: Urine, Random  Result Value Ref Range Status   Specimen Description URINE, RANDOM  Final   Special Requests NONE  Final   Culture (A)  Final    >=100,000 COLONIES/mL AEROCOCCUS SPECIES Standardized susceptibility testing for this organism is not available. Performed at [facility], [place] [address] [place], [place] [address]    Report Status 03/03/2019 FINAL  Final  SARS CORONAVIRUS 2  (TAT 6-24 HRS) Nasopharyngeal Nasopharyngeal Swab     Status: None   Collection Time: 03/01/19 10:10 PM   Specimen: Nasopharyngeal Swab  Result Value Ref Range Status   SARS Coronavirus 2 NEGATIVE NEGATIVE Final    Comment: (NOTE) SARS-CoV-2 target nucleic acids  are NOT DETECTED. The SARS-CoV-2 RNA is generally detectable in upper and lower respiratory specimens during the acute phase of infection. Negative results do not preclude SARS-CoV-2 infection, do not rule out co-infections with other pathogens, and should not be used as the sole basis for treatment or other patient management decisions. Negative results must be combined with clinical observations, patient history, and epidemiological information. The expected result is Negative. Fact Sheet for Patients: SugarRoll.be Fact Sheet for Healthcare Providers: https://www.woods-mathews.com/ This test is not yet approved or cleared by the [place] FDA and  has been authorized for detection and/or diagnosis of SARS-CoV-2 by FDA under an Emergency Use Authorization (EUA). This EUA will remain  in effect (meaning this test can be used) for the duration of the COVID-19 declaration under Section 56 4(b)(1) of the Act, 21 U.S.C. section 360bbb-3(b)(1), unless the authorization is terminated or revoked sooner. Performed at [facility], [place] [address] [place], [place] [address]   Culture, blood (Routine X 2) w Reflex to ID Panel     Status: None   Collection Time: 03/02/19  2:09 AM   Specimen: BLOOD RIGHT FOREARM  Result Value Ref Range Status   Specimen Description BLOOD RIGHT FOREARM  Final   Special Requests   Final    BOTTLES DRAWN AEROBIC AND ANAEROBIC Blood Culture results may not be optimal due to an inadequate volume of blood received in culture bottles   Culture   Final    NO GROWTH 5 DAYS Performed at [facility], [place] [address], [place], [place] [address]     Report Status 03/07/2019 FINAL  Final  Culture, blood (Routine X 2) w Reflex to ID Panel     Status: None   Collection Time: 03/02/19  5:25 AM   Specimen: BLOOD  Result Value Ref Range Status   Specimen Description BLOOD RIGHT ARM  Final   Special Requests   Final    BOTTLES DRAWN AEROBIC AND ANAEROBIC Blood Culture adequate volume   Culture   Final    NO GROWTH 5 DAYS Performed at [facility], [place] [address] [place], [place] [address]    Report Status 03/07/2019 FINAL  Final         Radiology Studies: Ct Angio Head W Or Wo Contrast  Result Date: 03/06/2019 CLINICAL DATA:  Follow-up examination for acute stroke. EXAM: CT ANGIOGRAPHY HEAD AND NECK TECHNIQUE: Multidetector CT imaging of the head and neck was performed using the standard protocol during bolus administration of intravenous contrast. Multiplanar CT image reconstructions and MIPs were obtained to evaluate the vascular anatomy. Carotid stenosis measurements (when applicable) are obtained utilizing NASCET criteria, using the distal internal carotid diameter as the denominator. CONTRAST:  58mL OMNIPAQUE IOHEXOL 350 MG/ML SOLN COMPARISON:  Prior CT from 03/03/2019. FINDINGS: CT HEAD FINDINGS Brain: Evolving subacute predominantly right MCA territory infarct involving the right parietotemporal region again seen, relatively stable in size and distribution from previous. Probable associated faint petechial hemorrhage without hemorrhagic transformation or significant mass effect. 10 mm subacute intraparenchymal hemorrhage at the right lentiform nucleus again seen as well, relatively similar in size, but with decreased attenuation as compared to previous. Mild localized edema without significant regional mass effect. No other new acute intracranial hemorrhage. No acute large vessel territory infarct. Focal subcortical hypodensity at the high parasagittal right frontal lobe noted, stable from previous, and could be related to prior  ischemia or possibly PML on this patient with HIV (series 5, image 25).  No mass lesion or midline shift. No hydrocephalus. No extra-axial fluid collection. Vascular: No hyperdense vessel. Scattered calcified atherosclerosis at the skull base. Skull: Scalp soft tissues and calvarium within normal limits. Sinuses: Trace layering opacity noted within the left sphenoid sinus. Paranasal sinuses are otherwise clear. No mastoid effusion. Orbits: Globes orbital soft tissues demonstrate no acute finding. Remote posttraumatic defect noted at the left orbital floor. Review of the MIP images confirms the above findings CTA NECK FINDINGS Aortic arch: Previously identified intraluminal thrombus protruding into the aortic arch again seen, grossly similar to prior chest CT (series 13, image 323, 328). Moderate atherosclerotic irregularity throughout the visualized arch itself. No hemodynamically significant stenosis seen about the origin of the great vessels. Visualized subclavian arteries widely patent. Right carotid system: Right CCA patent from its origin to the bifurcation without stenosis. Medial is a shin of the right common carotid artery into the retropharyngeal space. Atheromatous irregularity about the right bifurcation/proximal right ICA with associated stenosis of up to 30% by NASCET criteria. Right ICA tortuous but otherwise patent to the skull base without stenosis, dissection or occlusion. Left carotid system: Small amount of intraluminal thrombus seen protruding into the left common carotid artery at its origin (series 13, image 309). Left CCA patent from its origin to the bifurcation without flow-limiting stenosis. Medialization of the left common carotid artery into the retropharyngeal space. Mild atheromatous irregularity about the left carotid bifurcation without hemodynamically significant stenosis (no more than 25%). Left ICA tortuous but otherwise widely patent to the skull base without stenosis, dissection,  or occlusion. Vertebral arteries: Both vertebral arteries arise from the subclavian arteries. Vertebral arteries mildly tortuous but widely patent within the neck without stenosis, dissection, or occlusion. Skeleton: No acute osseous abnormality. No discrete osseous lesions. 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 Patient is edentulous. Other neck: No other acute soft tissue abnormality within the neck. Approximate 19 mm left thyroid nodule noted, with additional 1 cm adjacent left thyroid nodule. Right thyroid gland hypoplastic. Upper chest: Visualized upper chest demonstrates no other acute finding. Emphysematous changes noted. Review of the MIP images confirms the above findings CTA HEAD FINDINGS Anterior circulation: Petrous segments widely patent bilaterally. Scattered atheromatous irregularity within the cavernous/supraclinoid ICAs without hemodynamically significant stenosis. ICA termini well perfused. A1 segments widely patent. Normal anterior communicating artery. Anterior cerebral arteries patent to their distal aspects without stenosis. Right M1 widely patent. Normal right MCA bifurcation. There is apparent abrupt occlusion of a proximal right M3 branch at its origin, best seen on coronal reformatted images (series 14, image 112). Finding consistent with the evolving subacute right MCA territory infarct. Distal right MCA branches otherwise well perfused. Left M1 widely patent. Normal left MCA bifurcation. Distal left MCA branches well perfused. Posterior circulation: Vertebral arteries patent to the vertebrobasilar junction without stenosis. Left vertebral artery dominant. Posterior inferior cerebral arteries patent bilaterally. Basilar patent to its distal aspect without stenosis. Superior cerebral arteries patent bilaterally. Both of the posterior cerebral arteries primarily supplied via the basilar and are well perfused their distal aspects. Venous sinuses: Patent. Anatomic  variants: None significant. Review of the MIP images confirms the above findings IMPRESSION: CT HEAD IMPRESSION: 1. Normal expected interval evolution of subacute right MCA territory infarcts, stable in size and distribution as compared to previous exams. 2. 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 No evidence for interval re-bleeding or significant mass effect. 3. Focal area  of confluent hypodensity involving the high parasagittal right frontal lobe, stable from previous. Finding is indeterminate, and could reflect sequelae of chronic ischemia or other insult. 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 CTA HEAD AND NECK IMPRESSION: 1. 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 2. 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 Finding could serve as an embolic source. 3. 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 4. Mild to moderate scattered atheromatous change elsewhere about the major arterial vasculature of the head and neck. No other hemodynamically significant or correctable stenosis identified. Electronically Signed   By: Jeannine Boga M.D.   On: 03/06/2019 03:59   Ct Angio Neck W Or Wo Contrast  Result Date: 03/06/2019 CLINICAL DATA:  Follow-up examination for acute stroke. EXAM: CT ANGIOGRAPHY HEAD AND NECK TECHNIQUE: Multidetector CT imaging of the head and neck was performed using the standard protocol during bolus administration of intravenous contrast. Multiplanar CT image reconstructions and MIPs were obtained to evaluate the vascular anatomy. Carotid stenosis measurements (when  applicable) are obtained utilizing NASCET criteria, using the distal internal carotid diameter as the denominator. CONTRAST:  44mL OMNIPAQUE IOHEXOL 350 MG/ML SOLN COMPARISON:  Prior CT from 03/03/2019. FINDINGS: CT HEAD FINDINGS Brain: Evolving subacute predominantly right MCA territory infarct involving the right parietotemporal region again seen, relatively stable in size and distribution from previous. Probable associated faint petechial hemorrhage without hemorrhagic transformation or significant mass effect. 10 mm subacute intraparenchymal hemorrhage at the right lentiform nucleus again seen as well, relatively similar in size, but with decreased attenuation as compared to previous. Mild localized edema without significant regional mass effect. No other new acute intracranial hemorrhage. No acute large vessel territory infarct. 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 No mass lesion or midline shift. No hydrocephalus. No extra-axial fluid collection. Vascular: No hyperdense vessel. Scattered calcified atherosclerosis at the skull base. Skull: Scalp soft tissues and calvarium within normal limits. Sinuses: Trace layering opacity noted within the left sphenoid sinus. Paranasal sinuses are otherwise clear. No mastoid effusion. Orbits: Globes orbital soft tissues demonstrate no acute finding. Remote posttraumatic defect noted at the left orbital floor. Review of the MIP images confirms the above findings CTA NECK FINDINGS Aortic arch: Previously identified intraluminal thrombus protruding into the aortic arch again seen, grossly similar to prior chest CT (series 13, image 323, 328). Moderate atherosclerotic irregularity throughout the visualized arch itself. No hemodynamically significant stenosis seen about the origin of the great vessels. Visualized  subclavian arteries widely patent. Right carotid system: Right CCA patent from its origin to the bifurcation without stenosis. Medial is a shin of the right common carotid artery into the retropharyngeal space. Atheromatous irregularity about the right bifurcation/proximal right ICA with associated stenosis of up to 30% by NASCET criteria. Right ICA tortuous but otherwise patent to the skull base without stenosis, dissection or occlusion. Left carotid system: Small amount of intraluminal thrombus seen protruding into the left common carotid artery at its origin (series 13, image 309). Left CCA patent from its origin to the bifurcation without flow-limiting stenosis. Medialization of the left common carotid artery into the  retropharyngeal space. Mild atheromatous irregularity about the left carotid bifurcation without hemodynamically significant stenosis (no more than 25%). Left ICA tortuous but otherwise widely patent to the skull base without stenosis, dissection, or occlusion. Vertebral arteries: Both vertebral arteries arise from the subclavian arteries. Vertebral arteries mildly tortuous but widely patent within the neck without stenosis, dissection, or occlusion. Skeleton: No acute osseous abnormality. No discrete osseous lesions. 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 Patient is edentulous. Other neck: No other acute soft tissue abnormality within the neck. Approximate 19 mm left thyroid nodule noted, with additional 1 cm adjacent left thyroid nodule. Right thyroid gland hypoplastic. Upper chest: Visualized upper chest demonstrates no other acute finding. Emphysematous changes noted. Review of the MIP images confirms the above findings CTA HEAD FINDINGS Anterior circulation: Petrous segments widely patent bilaterally. Scattered atheromatous irregularity within the cavernous/supraclinoid ICAs without hemodynamically significant stenosis. ICA termini well perfused. A1 segments widely  patent. Normal anterior communicating artery. Anterior cerebral arteries patent to their distal aspects without stenosis. Right M1 widely patent. Normal right MCA bifurcation. There is apparent abrupt occlusion of a proximal right M3 branch at its origin, best seen on coronal reformatted images (series 14, image 112). Finding consistent with the evolving subacute right MCA territory infarct. Distal right MCA branches otherwise well perfused. Left M1 widely patent. Normal left MCA bifurcation. Distal left MCA branches well perfused. Posterior circulation: Vertebral arteries patent to the vertebrobasilar junction without stenosis. Left vertebral artery dominant. Posterior inferior cerebral arteries patent bilaterally. Basilar patent to its distal aspect without stenosis. Superior cerebral arteries patent bilaterally. Both of the posterior cerebral arteries primarily supplied via the basilar and are well perfused their distal aspects. Venous sinuses: Patent. Anatomic variants: None significant. Review of the MIP images confirms the above findings IMPRESSION: CT HEAD IMPRESSION: 1. Normal expected interval evolution of subacute right MCA territory infarcts, stable in size and distribution as compared to previous exams. 2. 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 No evidence for interval re-bleeding or significant mass effect. 3. Focal area of confluent hypodensity involving the high parasagittal right frontal lobe, stable from previous. Finding is indeterminate, and could reflect sequelae of chronic ischemia or other insult. 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 CTA HEAD AND NECK IMPRESSION: 1. 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 2. Scattered areas of  intraluminal thrombus involving the aortic arch and origin of the left common carotid artery as above, relatively similar to prior chest CT from 03/01/2019. Finding could serve as an embolic source. 3. 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 4. Mild to moderate scattered atheromatous change elsewhere about the major arterial vasculature of the head and neck. No other hemodynamically significant or correctable stenosis identified. Electronically Signed   By: Jeannine Boga M.D.   On: 03/06/2019 03:59   Mr Jeri Cos GU Contrast  Result Date: 03/07/2019 CLINICAL DATA:  73 year old female with right hemisphere/right MCA territory infarct detected by head CT since 03/02/2019. Right MCA M3 occlusion on CTA yesterday. History of HIV and lung cancer. EXAM: MRI HEAD WITHOUT AND WITH CONTRAST TECHNIQUE: Multiplanar, multiecho pulse sequences of the brain and surrounding structures were obtained without and with intravenous contrast. CONTRAST:  10 milliliters Gadavist COMPARISON:  CTA head and neck 03/06/2019. Head CTs 03/03/2019 and  earlier. Brain MRI 11/22/2013. FINDINGS: Study is intermittently degraded by motion artifact despite repeated imaging attempts. Brain: Confluent 3 centimeter area of restricted diffusion at the junction of the posterior right temporal lobe, anterior right occipital lobe and inferior right parietal lobe corresponding to the dominant area of hypodensity by CT recently. Associated T2 and FLAIR hyperintensity compatible with cytotoxic edema. Superimposed patchy restricted diffusion in the right periatrial white matter, and small oval petechial hemorrhage in the posterior right lentiform with surrounding restricted diffusion (stable and 10 millimeters on series 13, image 13 today). Restricted diffusion in the nearby right corona radiata and posterior right insula. Multiple small cortical and subcortical white matter foci of restricted diffusion elsewhere in the  posterior right MCA territory (series 5 images 80 through 85), and also in the right frontal operculum. No restricted diffusion in the contralateral left hemisphere. Although there is a questionable punctate area of acute to subacute diffusion restriction in the right cerebellum on series 5, image 55. No other blood products identified. Abnormal T2 and FLAIR hyperintensity in the medial superior right frontal gyrus new since 2015 and seen by CT is facilitated on diffusion, but is associated with nodular heterogeneous enhancement which appears to be partially extra-axial (series 9, image 11 and series 16, image 25), although only 1 postcontrast sequence could be obtained. This extra-axial component of the lesion is dark on T2 imaging, and the underlying gyrus is mildly expanded. This is new since 2015. Elsewhere there is post ischemic enhancement with some intrinsic T1 hyperintense blood products in the right lentiform. There is mild post ischemic enhancement at the most confluent area of restricted diffusion. There is also punctate enhancement suggested at the right frontal operculum on series 16, image 19 which is probably corresponding to a small cortical infarct on series 5, image 83. No other abnormal intracranial enhancement is identified. No superimposed midline shift, ventriculomegaly. The left deep gray nuclei and brainstem appear to remain normal. Mild scattered chronic cerebral white matter T2 and FLAIR hyperintensity elsewhere is stable since 2015. Cervicomedullary junction and pituitary are within normal limits. Vascular: Major intracranial vascular flow voids are stable since 2015. Skull and upper cervical spine: Nonspecific decreased T1 signal in the upper cervical vertebrae since 2015. No suspicious skull bone marrow signal changes, including overlying the right superior frontal gyrus lesion. Sinuses/Orbits: Chronic left orbital floor fracture, stable. Other: Mastoids are clear. IMPRESSION: 1. Nodular  and enhancing right superior frontal gyrus and/or extra-axial mass measuring about 15 mm with mild regional vasogenic edema is new since the 2015 MRI. Only 1 motion degraded post-contrast sequence could be obtained today. Given this patient's medical history differential considerations in lung cancer metastasis, HIV related infection, CNS lymphoma, or less likely meningioma. A repeat Brain MRI without and with contrast when the patient can better cooperate might refine the differential diagnosis. 2. Scattered acute infarcts in the right hemisphere as on CT, mostly in the right MCA territory. Stable associated petechial hemorrhage in the posterior right lentiform. Possible punctate infarct in the right cerebellum, but no left-side vascular territory involvement. 3. No significant intracranial mass effect. Electronically Signed   By: Genevie Ann M.D.   On: 03/07/2019 06:24      Scheduled Meds:   stroke: mapping our early stages of recovery book   Does not apply Once   acetaminophen  650 mg Oral Q8H   apixaban  10 mg Oral BID   Followed by   Derrill Memo ON 03/13/2019] apixaban  5 mg Oral BID  bisacodyl  10 mg Rectal Once   calcium-vitamin D  1 tablet Oral Q breakfast   DULoxetine  20 mg Oral Daily   efavirenz-emtricitabine-tenofovir  1 tablet Oral QHS   gabapentin  600 mg Oral TID   mometasone-formoterol  2 puff Inhalation BID   nicotine  21 mg Transdermal Daily   OLANZapine  2.5 mg Oral QHS   omega-3 acid ethyl esters  1 g Oral Daily   sodium chloride flush  10-40 mL Intracatheter Q12H   umeclidinium bromide  2 puff Inhalation Daily   Continuous Infusions:    LOS: 6 days      Debbe Odea, MD [facility] Pager: [email or web address] Password [email or web address] 03/07/2019, 10:12 AM

## 2019-03-07 NOTE — Progress Notes (Signed)
  Speech Language Pathology Treatment: Cognitive-Linquistic  Patient Details Name: [name] MRN: [id number] DOB: 21-Jan-1946 Today's Date: 03/07/2019 Time: 0350-0938 SLP Time Calculation (min) (ACUTE ONLY): 34 min  Assessment / Plan / Recommendation Clinical Impression  Pt was seen for cognitive-linguistic treatment and was cooperative throughout the session. She was oriented x4 during the session and her overall cognition was notably improved compared that which was demonstrated during the evaluation on 03/02/2019. Articulatory precision remains reduced but pt stated that this is her baseline. She achieved 100% accuracy with 4-item immediate recall and 80% accuracy with recall of 5 related items increasing to 100% with min cues. She completed a 3-task mental manipulation activity with 100% accuracy. She achieved 60% accuracy with word deduction increasing to 80% with min-mod cues. She required min cues to sequence the necessary steps to place her dinner order and min cues were needed throughout the session to sustain attention. She required mod-max cues for time management problems and achieved 67% accuracy when this level of support was provided. SLP will continue to follow pt.    HPI HPI: Pt is a 73 y.o. 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 CT of the head revealed acute right MCA territory ischemic infarct. 10 mm hemorrhage in the right basal ganglia.      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 Hardin Negus, [place], [place] Office number [phone] Pager [place] 03/07/2019, 4:36 PM

## 2019-03-08 ENCOUNTER — Encounter (HOSPITAL_COMMUNITY): Payer: Self-pay | Admitting: Emergency Medicine

## 2019-03-08 ENCOUNTER — Inpatient Hospital Stay (HOSPITAL_COMMUNITY): Payer: Medicare Other | Admitting: Anesthesiology

## 2019-03-08 ENCOUNTER — Inpatient Hospital Stay (HOSPITAL_COMMUNITY): Payer: Medicare Other

## 2019-03-08 ENCOUNTER — Encounter (HOSPITAL_COMMUNITY)
Admission: EM | Disposition: A | Discharge status: Skilled Nursing Facility | Payer: Self-pay | Source: Home / Self Care | Attending: Internal Medicine

## 2019-03-08 DIAGNOSIS — G9389 Other specified disorders of brain: Secondary | ICD-10-CM

## 2019-03-08 DIAGNOSIS — C3492 Malignant neoplasm of unspecified part of left bronchus or lung: Secondary | ICD-10-CM

## 2019-03-08 DIAGNOSIS — I10 Essential (primary) hypertension: Secondary | ICD-10-CM

## 2019-03-08 DIAGNOSIS — I63411 Cerebral infarction due to embolism of right middle cerebral artery: Secondary | ICD-10-CM

## 2019-03-08 DIAGNOSIS — I6389 Other cerebral infarction: Secondary | ICD-10-CM

## 2019-03-08 HISTORY — PX: BUBBLE STUDY: SHX6837

## 2019-03-08 HISTORY — PX: TEE WITHOUT CARDIOVERSION: SHX5443

## 2019-03-08 LAB — PROTHROMBIN GENE MUTATION

## 2019-03-08 LAB — T-HELPER CELLS (CD4) COUNT (NOT AT ARMC)
CD4 % Helper T Cell: 30 % — ABNORMAL LOW (ref 33–65)
CD4 T Cell Abs: 796 /uL (ref 400–1790)

## 2019-03-08 LAB — FACTOR 5 LEIDEN

## 2019-03-08 SURGERY — ECHOCARDIOGRAM, TRANSESOPHAGEAL
Anesthesia: Monitor Anesthesia Care

## 2019-03-08 MED ORDER — LIDOCAINE 2% (20 MG/ML) 5 ML SYRINGE
INTRAMUSCULAR | Status: DC | PRN
  Administered 2019-03-08: 40 mg via INTRAVENOUS

## 2019-03-08 MED ORDER — PROPOFOL 10 MG/ML IV BOLUS
INTRAVENOUS | Status: DC | PRN
  Administered 2019-03-08: 30 mg via INTRAVENOUS
  Administered 2019-03-08: 20 mg via INTRAVENOUS

## 2019-03-08 MED ORDER — PROPOFOL 500 MG/50ML IV EMUL
INTRAVENOUS | Status: DC | PRN
  Administered 2019-03-08: 50 ug/kg/min via INTRAVENOUS

## 2019-03-08 MED ORDER — LACTATED RINGERS IV SOLN
INTRAVENOUS | Status: DC | PRN
  Administered 2019-03-08: 09:00:00 via INTRAVENOUS

## 2019-03-08 NOTE — Anesthesia Postprocedure Evaluation (Signed)
Anesthesia Post Note  Patient: [name]  Procedure(s) Performed: TRANSESOPHAGEAL ECHOCARDIOGRAM (TEE) (N/A ) BUBBLE STUDY     Patient location during evaluation: Endoscopy Anesthesia Type: MAC Level of consciousness: awake and alert Pain management: pain level controlled Vital Signs Assessment: post-procedure vital signs reviewed and stable Respiratory status: spontaneous breathing, nonlabored ventilation, respiratory function stable and patient connected to nasal cannula oxygen Cardiovascular status: blood pressure returned to baseline and stable Postop Assessment: no apparent nausea or vomiting Anesthetic complications: no    Last Vitals:  Vitals:   03/08/19 0954 03/08/19 0955  BP:    Pulse: 99 99  Resp: 16 15  Temp:    SpO2: 91% 90%    Last Pain:  Vitals:   03/08/19 0942  TempSrc: Temporal  PainSc:                  [place]

## 2019-03-08 NOTE — Anesthesia Procedure Notes (Signed)
Procedure Name: MAC Date/Time: 03/08/2019 9:20 AM Performed by: Renato Shin, CRNA Pre-anesthesia Checklist: Patient identified, Emergency Drugs available, Suction available and Patient being monitored Patient Re-evaluated:Patient Re-evaluated prior to induction Oxygen Delivery Method: Simple face mask Preoxygenation: Pre-oxygenation with 100% oxygen Induction Type: IV induction Placement Confirmation: positive ETCO2 and breath sounds checked- equal and bilateral Dental Injury: Teeth and Oropharynx as per pre-operative assessment

## 2019-03-08 NOTE — Progress Notes (Signed)
SLP Cancellation Note  Patient Details Name: [name] MRN: [id number] DOB: January 01, 1946   Cancelled treatment:       Reason Eval/Treat Not Completed: Patient at procedure or test/unavailable;Other (comment) - Unable to assess diet tolerance at this time, as pt is NPO for TEE today. Will continue efforts.  Ryen Rhames B. Quentin Ore [facility] [place], [place] Speech Language Pathologist [phone]  Shonna Chock 03/08/2019, 8:48 AM

## 2019-03-08 NOTE — Progress Notes (Signed)
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  CT of the chest abdomen and pelvis showed pulmonary emboli and embolus in the right aortic arch with right renal infarcts and splenic infarct.  Subsequently a CT of the head showed acute right MCA territory infarct with 10 mm  Hemorrhagic focus in right basal ganglia.  Neurology consulted for stroke evaluation.   Assessment & Plan:   Principal Problem:   PE (pulmonary thromboembolism) ([place]) Active Problems:   Human immunodeficiency virus (HIV) disease ([place])   Hypertension   Cigarette smoker   Acute hypoxemic respiratory failure (HCC)   Non-small cell lung cancer (HCC)   COPD (chronic obstructive pulmonary disease) (HCC)   Tobacco abuse   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HCC)   CKD (chronic kidney disease), stage III ([place])   Fall at home   Generalized weakness   CVA (cerebral vascular accident) ([place])   Altered mental status   Acute pulmonary embolism without acute cor pulmonale (HCC)   Acute ischemic right MCA stroke ([place])   Palliative care encounter   Aortic embolism or thrombosis ([place])   DNR (do not resuscitate)   Acute hypercapnic respiratory failure secondary to COPD flare in addition to pulmonary embolus and possibly from narcotic use. Much improved after BiPAP and currently is on 2 L of nasal cannula oxygen. Continue with duo nebs as needed and Dulera.    Right MCA territory infarcts ,  Right cerebellum infarct.  Weakness on the left side with sensory deficits left facial droop.  Reviewed CT angiogram  of the head and neck Hypercoagulable work-up ordered showed negative cardiolipin normal protein C, beta-2 glycoprotein is negative, homocystine is normal.  Antithrombin 3 is normal. Echocardiogram ordered and reviewed. Continue with Eliquis. Repeat evaluation recommending SNF on discharge LDL 103 Hemoglobin A1c 6.4.   TEE done today showed Moderate LVH. Normal LV systolic function. No thrombus or intracardiac masses are seen. The right ventricle is moderately dilated, with preserved systolic function. The atrial septum is aneurysmal. Although a PFO was not identified with color Doppler imaging, there was a small right to left shunt identified by saline contrast.  Cardiology recommends life long anti coagulation for high suspicion for paradoxical embolism.    Repeat MRI of the brain when pt co operates.    Hypo-kalemia replaced Repeat BMP in the morning.   Tobacco abuse And nicotine patch ordered  History of HIV Continue with Atripla 1 tab daily at bedtime    Essential hypertension Well-controlled blood pressure parameters.  Stage III CKD Creatinine at baseline.   Thrombocytopenia Improved platelet count. They are within normal limits at 175000 yesterday.   Aerococcus in urine cultures Patient asymptomatic. No further treatment.  Acute metabolic encephalopathy  Palliative care consulted, and recommendations given patient is currently DNR   Type 2 diabetes mellitus Hemoglobin A1c at 6.4.  Non small cell lung CA:  S/p radiation.   Aortic thrombus  On eliquis.   DVT prophylaxis: SCDs Code Status: DNR Family Communication: None at bedside today Disposition Plan: Pending further recommendations and palliative care consult  Consultants:   Cardiology for  TEE  Neurology  Procedures: TEE scheduled today Echocardiogram showed left ventricular ejection fraction within normal limits Dopplers parameters consistent with impaired relaxation, moderate pulmonary  hypertension  Antimicrobials: None  Subjective: Denies any chest pain or shortness of breath  Objective: Vitals:   03/08/19 0953 03/08/19 0954 03/08/19 0955 03/08/19 1224  BP:      Pulse: 100 99 99   Resp: 12 16 15  (!) 21  Temp:    98.3 F (36.8 C)  TempSrc:    Oral  SpO2: 91% 91% 90%   Weight:      Height:        Intake/Output Summary (Last 24 hours) at 03/08/2019 1503 Last data filed at 03/08/2019 1225 Gross per 24 hour  Intake 330 ml  Output 300 ml  Net 30 ml   Filed Weights   03/01/19 2000 03/08/19 0840  Weight: 100.7 kg 100.7 kg    Examination:  General exam: Not in any kind of distress Respiratory system: Clear to auscultation. Respiratory effort normal. Cardiovascular system: S1 & S2 heard, RRR. Gastrointestinal system: Abdomen is nondistended, soft and nontender.  Normal bowel sounds heard. Central nervous system: Alert and oriented to person only, grossly nonfocal Extremities: No pedal edema, cyanosis or clubbing Skin: No rashes, lesions or ulcers Psychiatry: No agitation   Data Reviewed: I have personally reviewed following labs and imaging studies  CBC: Recent Labs  Lab 03/02/19 0525 03/04/19 0332 03/05/19 0523 03/06/19 0412 03/07/19 0411  WBC 9.6 6.9 7.4 6.2 8.8  HGB 14.4 13.3 13.0 15.9* 14.8  HCT 53.7* 50.0* 47.1* 57.7* 53.1*  MCV 88.5 89.1 85.6 84.9 85.0  PLT 137* 115* 128* 121* 454   Basic Metabolic Panel: Recent Labs  Lab 03/02/19 0525 03/03/19 1625 03/04/19 0332 03/07/19 0411  NA 137 142 140 143  K 3.5 3.9 4.2 3.2*  CL 97* 105 104 102  CO2 26 29 25 31   GLUCOSE 80 89 96 107*  BUN 10 8 8 8   CREATININE 1.04* 0.86 0.85 0.69  CALCIUM 7.9* 8.2* 8.2* 9.2   GFR: Estimated Creatinine Clearance: 81.5 mL/min (by C-G formula based on SCr of 0.69 mg/dL). Liver Function Tests: No results for input(s): AST, ALT, ALKPHOS, BILITOT, PROT, ALBUMIN in the last 168 hours. No results for input(s): LIPASE, AMYLASE in the last 168 hours. No  results for input(s): AMMONIA in the last 168 hours. Coagulation Profile: No results for input(s): INR, PROTIME in the last 168 hours. Cardiac Enzymes: No results for input(s): CKTOTAL, CKMB, CKMBINDEX, TROPONINI in the last 168 hours. BNP (last 3 results) No results for input(s): PROBNP in the last 8760 hours. HbA1C: No results for input(s): HGBA1C in the last 72 hours. CBG: Recent Labs  Lab 03/02/19 0644 03/03/19 0358 03/03/19 0546  GLUCAP 102* 104* 98   Lipid Profile: No results for input(s): CHOL, HDL, LDLCALC, TRIG, CHOLHDL, LDLDIRECT in the last 72 hours. Thyroid Function Tests: No results for input(s): TSH, T4TOTAL, FREET4, T3FREE, THYROIDAB in the last 72 hours. Anemia Panel: No results for input(s): VITAMINB12, FOLATE, FERRITIN, TIBC, IRON, RETICCTPCT in the last 72 hours. Sepsis Labs: Recent Labs  Lab 03/03/19 0549 03/03/19 1625  PROCALCITON  --  <0.10  LATICACIDVEN 0.5  --     Recent Results (from the past 240 hour(s))  Urine culture     Status: Abnormal   Collection Time: 03/01/19  5:10 PM   Specimen: Urine, Random  Result Value Ref Range Status   Specimen Description URINE, RANDOM  Final  Special Requests NONE  Final   Culture (A)  Final    >=100,000 COLONIES/mL AEROCOCCUS SPECIES Standardized susceptibility testing for this organism is not available. Performed at [facility], [place] [address], [place], [place] [address]    Report Status 03/03/2019 FINAL  Final  SARS CORONAVIRUS 2 (TAT 6-24 HRS) Nasopharyngeal Nasopharyngeal Swab     Status: None   Collection Time: 03/01/19 10:10 PM   Specimen: Nasopharyngeal Swab  Result Value Ref Range Status   SARS Coronavirus 2 NEGATIVE NEGATIVE Final    Comment: (NOTE) SARS-CoV-2 target nucleic acids are NOT DETECTED. The SARS-CoV-2 RNA is generally detectable in upper and lower respiratory specimens during the acute phase of infection. Negative results do not preclude SARS-CoV-2 infection, do not rule  out co-infections with other pathogens, and should not be used as the sole basis for treatment or other patient management decisions. Negative results must be combined with clinical observations, patient history, and epidemiological information. The expected result is Negative. Fact Sheet for Patients: SugarRoll.be Fact Sheet for Healthcare Providers: https://www.woods-mathews.com/ This test is not yet approved or cleared by the [place] FDA and  has been authorized for detection and/or diagnosis of SARS-CoV-2 by FDA under an Emergency Use Authorization (EUA). This EUA will remain  in effect (meaning this test can be used) for the duration of the COVID-19 declaration under Section 56 4(b)(1) of the Act, 21 U.S.C. section 360bbb-3(b)(1), unless the authorization is terminated or revoked sooner. Performed at [facility], [place] [address], [place], [place] [address]   Culture, blood (Routine X 2) w Reflex to ID Panel     Status: None   Collection Time: 03/02/19  2:09 AM   Specimen: BLOOD RIGHT FOREARM  Result Value Ref Range Status   Specimen Description BLOOD RIGHT FOREARM  Final   Special Requests   Final    BOTTLES DRAWN AEROBIC AND ANAEROBIC Blood Culture results may not be optimal due to an inadequate volume of blood received in culture bottles   Culture   Final    NO GROWTH 5 DAYS Performed at [facility], [place] [address] [place], [place] [address]    Report Status 03/07/2019 FINAL  Final  Culture, blood (Routine X 2) w Reflex to ID Panel     Status: None   Collection Time: 03/02/19  5:25 AM   Specimen: BLOOD  Result Value Ref Range Status   Specimen Description BLOOD RIGHT ARM  Final   Special Requests   Final    BOTTLES DRAWN AEROBIC AND ANAEROBIC Blood Culture adequate volume   Culture   Final    NO GROWTH 5 DAYS Performed at [facility], [place] [address], [place], [place] [address]    Report Status  03/07/2019 FINAL  Final         Radiology Studies: Mr Jeri Cos VO Contrast  Result Date: 03/07/2019 CLINICAL DATA:  73 year old female with right hemisphere/right MCA territory infarct detected by head CT since 03/02/2019. Right MCA M3 occlusion on CTA yesterday. History of HIV and lung cancer. EXAM: MRI HEAD WITHOUT AND WITH CONTRAST TECHNIQUE: Multiplanar, multiecho pulse sequences of the brain and surrounding structures were obtained without and with intravenous contrast. CONTRAST:  10 milliliters Gadavist COMPARISON:  CTA head and neck 03/06/2019. Head CTs 03/03/2019 and earlier. Brain MRI 11/22/2013. FINDINGS: Study is intermittently degraded by motion artifact despite repeated imaging attempts. Brain: Confluent 3 centimeter area of restricted diffusion at the junction of the posterior  right temporal lobe, anterior right occipital lobe and inferior right parietal lobe corresponding to the dominant area of hypodensity by CT recently. Associated T2 and FLAIR hyperintensity compatible with cytotoxic edema. Superimposed patchy restricted diffusion in the right periatrial white matter, and small oval petechial hemorrhage in the posterior right lentiform with surrounding restricted diffusion (stable and 10 millimeters on series 13, image 13 today). Restricted diffusion in the nearby right corona radiata and posterior right insula. Multiple small cortical and subcortical white matter foci of restricted diffusion elsewhere in the posterior right MCA territory (series 5 images 80 through 85), and also in the right frontal operculum. No restricted diffusion in the contralateral left hemisphere. Although there is a questionable punctate area of acute to subacute diffusion restriction in the right cerebellum on series 5, image 55. No other blood products identified. Abnormal T2 and FLAIR hyperintensity in the medial superior right frontal gyrus new since 2015 and seen by CT is facilitated on diffusion, but is  associated with nodular heterogeneous enhancement which appears to be partially extra-axial (series 9, image 11 and series 16, image 25), although only 1 postcontrast sequence could be obtained. This extra-axial component of the lesion is dark on T2 imaging, and the underlying gyrus is mildly expanded. This is new since 2015. Elsewhere there is post ischemic enhancement with some intrinsic T1 hyperintense blood products in the right lentiform. There is mild post ischemic enhancement at the most confluent area of restricted diffusion. There is also punctate enhancement suggested at the right frontal operculum on series 16, image 19 which is probably corresponding to a small cortical infarct on series 5, image 83. No other abnormal intracranial enhancement is identified. No superimposed midline shift, ventriculomegaly. The left deep gray nuclei and brainstem appear to remain normal. Mild scattered chronic cerebral white matter T2 and FLAIR hyperintensity elsewhere is stable since 2015. Cervicomedullary junction and pituitary are within normal limits. Vascular: Major intracranial vascular flow voids are stable since 2015. Skull and upper cervical spine: Nonspecific decreased T1 signal in the upper cervical vertebrae since 2015. No suspicious skull bone marrow signal changes, including overlying the right superior frontal gyrus lesion. Sinuses/Orbits: Chronic left orbital floor fracture, stable. Other: Mastoids are clear. IMPRESSION: 1. Nodular and enhancing right superior frontal gyrus and/or extra-axial mass measuring about 15 mm with mild regional vasogenic edema is new since the 2015 MRI. Only 1 motion degraded post-contrast sequence could be obtained today. Given this patient's medical history differential considerations in lung cancer metastasis, HIV related infection, CNS lymphoma, or less likely meningioma. A repeat Brain MRI without and with contrast when the patient can better cooperate might refine the  differential diagnosis. 2. Scattered acute infarcts in the right hemisphere as on CT, mostly in the right MCA territory. Stable associated petechial hemorrhage in the posterior right lentiform. Possible punctate infarct in the right cerebellum, but no left-side vascular territory involvement. 3. No significant intracranial mass effect. Electronically Signed   By: Genevie Ann M.D.   On: 03/07/2019 06:24        Scheduled Meds:   stroke: mapping our early stages of recovery book   Does not apply Once   acetaminophen  650 mg Oral Q8H   apixaban  10 mg Oral BID   Followed by   Derrill Memo ON 03/13/2019] apixaban  5 mg Oral BID   calcium-vitamin D  1 tablet Oral Q breakfast   DULoxetine  20 mg Oral Daily   efavirenz-emtricitabine-tenofovir  1 tablet Oral QHS   gabapentin  600 mg Oral TID   mometasone-formoterol  2 puff Inhalation BID   nicotine  21 mg Transdermal Daily   OLANZapine  2.5 mg Oral QHS   omega-3 acid ethyl esters  1 g Oral Daily   sodium chloride flush  10-40 mL Intracatheter Q12H   umeclidinium bromide  2 puff Inhalation Daily   Continuous Infusions:   LOS: 7 days    Time spent: 36 minutes.     Hosie Poisson, MD [facility] Pager [phone]   If 7PM-7AM, please contact night-coverage [email or web address] Password [email or web address] 03/08/2019, 3:03 PM

## 2019-03-08 NOTE — Interval H&P Note (Signed)
History and Physical Interval Note:  03/08/2019 8:17 AM  [name]  has presented today for surgery, with the diagnosis of Stroke.  The various methods of treatment have been discussed with the patient and family. After consideration of risks, benefits and other options for treatment, the patient has consented to  Procedure(s): TRANSESOPHAGEAL ECHOCARDIOGRAM (TEE) (N/A) as a surgical intervention.  The patient's history has been reviewed, patient examined, no change in status, stable for surgery.  I have reviewed the patient's chart and labs.  Questions were answered to the patient's satisfaction.     Nusayba Cadenas

## 2019-03-08 NOTE — Progress Notes (Signed)
Physical Therapy Cancellation Note   03/08/19 0910  PT Visit Information  Last PT Received On 03/08/19  Reason Eval/Treat Not Completed Patient at procedure or test/unavailable. Pt off unit for TEE. PT will continue to follow acutely.   Earney Navy, PTA Acute Rehabilitation Services Pager: [phone] Office: [phone]

## 2019-03-08 NOTE — Transfer of Care (Signed)
Immediate Anesthesia Transfer of Care Note  Patient: [name]  Procedure(s) Performed: TRANSESOPHAGEAL ECHOCARDIOGRAM (TEE) (N/A ) BUBBLE STUDY  Patient Location: PACU and Endoscopy Unit  Anesthesia Type:MAC  Level of Consciousness: awake, drowsy and patient cooperative  Airway & Oxygen Therapy: Patient Spontanous Breathing and Patient connected to face mask oxygen  Post-op Assessment: Report given to RN and Post -op Vital signs reviewed and stable  Post vital signs: Reviewed and stable  Last Vitals:  Vitals Value Taken Time  BP 138/66 03/08/19 0942  Temp 37.2 C 03/08/19 0942  Pulse 95 03/08/19 0943  Resp 26 03/08/19 0943  SpO2 96 % 03/08/19 0943  Vitals shown include unvalidated device data.  Last Pain:  Vitals:   03/08/19 0942  TempSrc: Temporal  PainSc:       Patients Stated Pain Goal: 0 (57/84/69 6295)  Complications: No apparent anesthesia complications

## 2019-03-08 NOTE — Progress Notes (Signed)
Physical Therapy Treatment Patient Details Name: [name] MRN: [id number] DOB: [birth year] Today's Date: 03/08/2019    History of Present Illness 73yo F dmitted 9/2 after a fall with generalized weakness and CP.  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 As of 9/4, pt with AMS CT H is evolving.PMH HIV, adenocarcinoma of LUL s/p radiation therapy, HTN, HLD, COPD, chronic respiratory failure on 4 L/min oxygen at baseline, asthma, HBV, tobacco abuse, CKD III.    PT Comments    Patient seen for mobility progression. Pt is making progress toward PT goals and requires min/mod A for OOB mobility including gait training distance of 20 ft. Continue to progress as tolerated with anticipated d/c to SNF for further skilled PT services.     Follow Up Recommendations  SNF;Supervision/Assistance - 24 hour     Equipment Recommendations  None recommended by PT    Recommendations for Other Services       Precautions / Restrictions Precautions Precautions: Fall;Other (comment) Precaution Comments: watch O2 Restrictions Weight Bearing Restrictions: No    Mobility  Bed Mobility Overal bed mobility: Needs Assistance Bed Mobility: Supine to Sit     Supine to sit: Min guard     General bed mobility comments: min guard for safety; use of rail and HOB elevated  Transfers Overall transfer level: Needs assistance Equipment used: Rolling walker (2 wheeled) Transfers: Sit to/from Stand Sit to Stand: Min guard         General transfer comment: cues for safe hand placement  Ambulation/Gait Ambulation/Gait assistance: +2 safety/equipment;Min assist;Mod assist Gait Distance (Feet): 20 Feet Assistive device: Rolling walker (2 wheeled) Gait Pattern/deviations: Step-to pattern;Decreased stride length;Decreased step length - left;Decreased dorsiflexion - left;Trunk flexed Gait velocity: decreased   General Gait  Details: cues for upright posture, increased L step length, and safe use of AD; assistance to manage RW and to steady   Stairs             Wheelchair Mobility    Modified Rankin (Stroke Patients Only) Modified Rankin (Stroke Patients Only) Pre-Morbid Rankin Score: Moderate disability Modified Rankin: Moderately severe disability     Balance Overall balance assessment: Needs assistance Sitting-balance support: No upper extremity supported;Feet supported Sitting balance-Leahy Scale: Good     Standing balance support: Single extremity supported;Bilateral upper extremity supported Standing balance-Leahy Scale: Poor                              Cognition Arousal/Alertness: Awake/alert Behavior During Therapy: WFL for tasks assessed/performed Overall Cognitive Status: Impaired/Different from baseline Area of Impairment: Problem solving;Safety/judgement                         Safety/Judgement: Decreased awareness of deficits;Decreased awareness of safety   Problem Solving: Requires verbal cues;Difficulty sequencing        Exercises      General Comments        Pertinent Vitals/Pain Pain Assessment: No/denies pain    Home Living                      Prior Function            PT Goals (current goals can now be found in the care plan section) Acute Rehab PT Goals Patient Stated Goal: unable to state Progress towards PT goals: Progressing toward  goals    Frequency    Min 3X/week      PT Plan Current plan remains appropriate    Co-evaluation              AM-PAC PT "6 Clicks" Mobility   Outcome Measure  Help needed turning from your back to your side while in a flat bed without using bedrails?: None Help needed moving from lying on your back to sitting on the side of a flat bed without using bedrails?: A Little Help needed moving to and from a bed to a chair (including a wheelchair)?: A Little Help needed  standing up from a chair using your arms (e.g., wheelchair or bedside chair)?: A Little Help needed to walk in hospital room?: A Little Help needed climbing 3-5 steps with a railing? : A Lot 6 Click Score: 18    End of Session Equipment Utilized During Treatment: Oxygen;Gait belt Activity Tolerance: Patient tolerated treatment well Patient left: in chair;with call bell/phone within reach;with nursing/sitter in room(safety sitter aware pt does not have chair alarm) Nurse Communication: Mobility status PT Visit Diagnosis: Unsteadiness on feet (R26.81);Other symptoms and signs involving the nervous system (R29.898);Other abnormalities of gait and mobility (R26.89)     Time: 6837-2902 PT Time Calculation (min) (ACUTE ONLY): 25 min  Charges:  $Gait Training: 23-37 mins                     Earney Navy, PTA Acute Rehabilitation Services Pager: [phone] Office: [phone]     Darliss Cheney 03/08/2019, 3:13 PM

## 2019-03-08 NOTE — Progress Notes (Addendum)
STROKE TEAM PROGRESS NOTE   INTERVAL HISTORY Pt just back from TEE. Lying in bed, awake, conversant, complains of back pain and whole body ache. TEE showed ASA and R to L shunt by saline contrast.   Vitals:   03/08/19 0953 03/08/19 0954 03/08/19 0955 03/08/19 1224  BP:      Pulse: 100 99 99   Resp: 12 16 15  (!) 21  Temp:    98.3 F (36.8 C)  TempSrc:    Oral  SpO2: 91% 91% 90%   Weight:      Height:        CBC:  Recent Labs  Lab 03/06/19 0412 03/07/19 0411  WBC 6.2 8.8  HGB 15.9* 14.8  HCT 57.7* 53.1*  MCV 84.9 85.0  PLT 121* 270    Basic Metabolic Panel:  Recent Labs  Lab 03/04/19 0332 03/07/19 0411  NA 140 143  K 4.2 3.2*  CL 104 102  CO2 25 31  GLUCOSE 96 107*  BUN 8 8  CREATININE 0.85 0.69  CALCIUM 8.2* 9.2   Lipid Panel:     Component Value Date/Time   CHOL 77 03/03/2019 0500   TRIG 70 03/03/2019 0500   HDL 15 (L) 03/03/2019 0500   CHOLHDL 5.1 03/03/2019 0500   VLDL 14 03/03/2019 0500   LDLCALC 48 03/03/2019 0500   HgbA1c:  Lab Results  Component Value Date   HGBA1C 6.4 (H) 03/03/2019   Urine Drug Screen:     Component Value Date/Time   LABOPIA NONE DETECTED 03/04/2019 1734   COCAINSCRNUR NONE DETECTED 03/04/2019 1734   LABBENZ NONE DETECTED 03/04/2019 1734   AMPHETMU NONE DETECTED 03/04/2019 1734   THCU NONE DETECTED 03/04/2019 1734   LABBARB NONE DETECTED 03/04/2019 1734    Alcohol Level No results found for: ETH  IMAGING Ct Angio Head W Or Wo Contrast  Result Date: 03/06/2019 CLINICAL DATA:  Follow-up examination for acute stroke. EXAM: CT ANGIOGRAPHY HEAD AND NECK TECHNIQUE: Multidetector CT imaging of the head and neck was performed using the standard protocol during bolus administration of intravenous contrast. Multiplanar CT image reconstructions and MIPs were obtained to evaluate the vascular anatomy. Carotid stenosis measurements (when applicable) are obtained utilizing NASCET criteria, using the distal internal carotid diameter  as the denominator. CONTRAST:  25mL OMNIPAQUE IOHEXOL 350 MG/ML SOLN COMPARISON:  Prior CT from 03/03/2019. FINDINGS: CT HEAD FINDINGS Brain: Evolving subacute predominantly right MCA territory infarct involving the right parietotemporal region again seen, relatively stable in size and distribution from previous. Probable associated faint petechial hemorrhage without hemorrhagic transformation or significant mass effect. 10 mm subacute intraparenchymal hemorrhage at the right lentiform nucleus again seen as well, relatively similar in size, but with decreased attenuation as compared to previous. Mild localized edema without significant regional mass effect. No other new acute intracranial hemorrhage. No acute large vessel territory infarct. 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 No mass lesion or midline shift. No hydrocephalus. No extra-axial fluid collection. Vascular: No hyperdense vessel. Scattered calcified atherosclerosis at the skull base. Skull: Scalp soft tissues and calvarium within normal limits. Sinuses: Trace layering opacity noted within the left sphenoid sinus. Paranasal sinuses are otherwise clear. No mastoid effusion. Orbits: Globes orbital soft tissues demonstrate no acute finding. Remote posttraumatic defect noted at the left orbital floor. Review of the MIP images confirms the above findings  CTA NECK FINDINGS Aortic arch: Previously identified intraluminal thrombus protruding into the aortic arch again seen, grossly similar to prior chest CT (series 13, image 323, 328). Moderate atherosclerotic irregularity throughout the visualized arch itself. No hemodynamically significant stenosis seen about the origin of the great vessels. Visualized subclavian arteries widely patent. Right carotid system: Right CCA patent from its origin to the  bifurcation without stenosis. Medial is a shin of the right common carotid artery into the retropharyngeal space. Atheromatous irregularity about the right bifurcation/proximal right ICA with associated stenosis of up to 30% by NASCET criteria. Right ICA tortuous but otherwise patent to the skull base without stenosis, dissection or occlusion. Left carotid system: Small amount of intraluminal thrombus seen protruding into the left common carotid artery at its origin (series 13, image 309). Left CCA patent from its origin to the bifurcation without flow-limiting stenosis. Medialization of the left common carotid artery into the retropharyngeal space. Mild atheromatous irregularity about the left carotid bifurcation without hemodynamically significant stenosis (no more than 25%). Left ICA tortuous but otherwise widely patent to the skull base without stenosis, dissection, or occlusion. Vertebral arteries: Both vertebral arteries arise from the subclavian arteries. Vertebral arteries mildly tortuous but widely patent within the neck without stenosis, dissection, or occlusion. Skeleton: No acute osseous abnormality. No discrete osseous lesions. 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 Patient is edentulous. Other neck: No other acute soft tissue abnormality within the neck. Approximate 19 mm left thyroid nodule noted, with additional 1 cm adjacent left thyroid nodule. Right thyroid gland hypoplastic. Upper chest: Visualized upper chest demonstrates no other acute finding. Emphysematous changes noted. Review of the MIP images confirms the above findings CTA HEAD FINDINGS Anterior circulation: Petrous segments widely patent bilaterally. Scattered atheromatous irregularity within the cavernous/supraclinoid ICAs without hemodynamically significant stenosis. ICA termini well perfused. A1 segments widely patent. Normal anterior communicating artery. Anterior cerebral arteries patent to their distal  aspects without stenosis. Right M1 widely patent. Normal right MCA bifurcation. There is apparent abrupt occlusion of a proximal right M3 branch at its origin, best seen on coronal reformatted images (series 14, image 112). Finding consistent with the evolving subacute right MCA territory infarct. Distal right MCA branches otherwise well perfused. Left M1 widely patent. Normal left MCA bifurcation. Distal left MCA branches well perfused. Posterior circulation: Vertebral arteries patent to the vertebrobasilar junction without stenosis. Left vertebral artery dominant. Posterior inferior cerebral arteries patent bilaterally. Basilar patent to its distal aspect without stenosis. Superior cerebral arteries patent bilaterally. Both of the posterior cerebral arteries primarily supplied via the basilar and are well perfused their distal aspects. Venous sinuses: Patent. Anatomic variants: None significant. Review of the MIP images confirms the above findings IMPRESSION: CT HEAD IMPRESSION: 1. Normal expected interval evolution of subacute right MCA territory infarcts, stable in size and distribution as compared to previous exams. 2. 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 No evidence for interval re-bleeding or significant mass effect. 3. Focal area of confluent hypodensity involving the high parasagittal right frontal lobe, stable from previous. Finding is indeterminate, and could reflect sequelae of chronic ischemia or other insult. 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 CTA HEAD AND NECK IMPRESSION: 1. 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 2. Scattered areas of intraluminal thrombus involving the aortic arch and origin  of the left common carotid artery as  above, relatively similar to prior chest CT from 03/01/2019. Finding could serve as an embolic source. 3. 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 4. Mild to moderate scattered atheromatous change elsewhere about the major arterial vasculature of the head and neck. No other hemodynamically significant or correctable stenosis identified. Electronically Signed   By: Jeannine Boga M.D.   On: 03/06/2019 03:59   Dg Chest 2 View  Result Date: 03/01/2019 CLINICAL DATA:  Anterior chest pain post fall. EXAM: CHEST - 2 VIEW COMPARISON:  February 14, 2019 FINDINGS: Cardiomediastinal silhouette is normal. Mediastinal contours appear intact. Tortuosity of the aorta. There is no evidence of pleural effusion or pneumothorax. Linear opacities in the left mid thorax, possibly posttreatment or postsurgical changes. No radiographic evidence of displaced rib fractures. Soft tissues are grossly normal. IMPRESSION: 1. Linear opacities in the left mid thorax, possibly posttreatment or postsurgical changes. 2. No radiographic evidence of displaced rib fractures. Electronically Signed   By: Fidela Salisbury M.D.   On: 03/01/2019 14:48   Ct Head Wo Contrast  Result Date: 03/03/2019 CLINICAL DATA:  Focal neuro deficit. Unresponsive. 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 EXAM: CT HEAD WITHOUT CONTRAST TECHNIQUE: Contiguous axial images were obtained from the base of the skull through the vertex without intravenous contrast. COMPARISON:  Head CT 03/02/2019 FINDINGS: Brain: A 10 mm hemorrhage in the right lentiform nucleus with mild surrounding edema is unchanged. Additional patchy areas of hypoattenuation involving cortex and white matter in the right temporal lobe, inferior right parietal lobe, and posteromedial right frontal lobe appears slightly more well-defined than on the prior study. No new intracranial hemorrhage, definite new infarct, midline shift, or extra-axial fluid collection is  identified. The ventricles are normal in size. Background chronic small vessel ischemic changes are present in the cerebral white matter bilaterally. Vascular: Calcified atherosclerosis at the skull base. No hyperdense vessel. Skull: No acute fracture or focal osseous lesion. Sinuses/Orbits: Old left orbital floor fracture. Mild right periorbital soft tissue swelling. Mild mucosal thickening in the paranasal sinuses. Clear mastoid air cells. Other: None. IMPRESSION: Evolving acute right cerebral infarcts primarily in the MCA territory. Unchanged 10 mm right basal ganglia hemorrhage. Electronically Signed   By: Logan Bores M.D.   On: 03/03/2019 05:21   Ct Head Wo Contrast  Result Date: 03/02/2019 CLINICAL DATA:  [place] out of bed. EXAM: CT HEAD WITHOUT CONTRAST TECHNIQUE: Contiguous axial images were obtained from the base of the skull through the vertex without intravenous contrast. COMPARISON:  MR brain dated Nov 22, 2013. FINDINGS: Brain: There is hypodensity and loss of the gray-white matter differentiation predominantly involving the right temporal lobe, consistent with acute infarct. 8 x 8 x 10 mm area of hemorrhage in the right basal ganglia with surrounding edema. No midline shift, hydrocephalus, or extra-axial collection. Vascular: Atherosclerotic vascular calcification of the carotid siphons. No hyperdense vessel. Skull: Normal. Negative for fracture or focal lesion. Sinuses/Orbits: No acute finding. Mild ethmoid air cell mucosal thickening. Other: None. IMPRESSION: 1. Acute right MCA territory ischemic infarct. 10 mm hemorrhage in the right basal ganglia. No midline shift or hydrocephalus. Critical Value/emergent results were called by telephone at the time of interpretation on 03/02/2019 at 7:55 am to Dr. Debbe Odea, who verbally acknowledged these results. Electronically Signed   By: Titus Dubin M.D.   On: 03/02/2019 07:59   Ct Angio Neck W Or Wo Contrast  Result Date: 03/06/2019 CLINICAL DATA:  Follow-up examination for acute stroke. EXAM: CT ANGIOGRAPHY HEAD AND NECK TECHNIQUE: Multidetector CT imaging of the head and neck was performed using the standard protocol during bolus administration of intravenous contrast. Multiplanar CT image reconstructions and MIPs were obtained to evaluate the vascular anatomy. Carotid stenosis measurements (when applicable) are obtained utilizing NASCET criteria, using the distal internal carotid diameter as the denominator. CONTRAST:  52mL OMNIPAQUE IOHEXOL 350 MG/ML SOLN COMPARISON:  Prior CT from 03/03/2019. FINDINGS: CT HEAD FINDINGS Brain: Evolving subacute predominantly right MCA territory infarct involving the right parietotemporal region again seen, relatively stable in size and distribution from previous. Probable associated faint petechial hemorrhage without hemorrhagic transformation or significant mass effect. 10 mm subacute intraparenchymal hemorrhage at the right lentiform nucleus again seen as well, relatively similar in size, but with decreased attenuation as compared to previous. Mild localized edema without significant regional mass effect. No other new acute intracranial hemorrhage. No acute large vessel territory infarct. 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 No mass lesion or midline shift. No hydrocephalus. No extra-axial fluid collection. Vascular: No hyperdense vessel. Scattered calcified atherosclerosis at the skull base. Skull: Scalp soft tissues and calvarium within normal limits. Sinuses: Trace layering opacity noted within the left sphenoid sinus. Paranasal sinuses are otherwise clear. No mastoid effusion. Orbits: Globes orbital soft tissues demonstrate no acute finding. Remote posttraumatic defect noted at the left orbital floor. Review of the MIP images confirms the above  findings CTA NECK FINDINGS Aortic arch: Previously identified intraluminal thrombus protruding into the aortic arch again seen, grossly similar to prior chest CT (series 13, image 323, 328). Moderate atherosclerotic irregularity throughout the visualized arch itself. No hemodynamically significant stenosis seen about the origin of the great vessels. Visualized subclavian arteries widely patent. Right carotid system: Right CCA patent from its origin to the bifurcation without stenosis. Medial is a shin of the right common carotid artery into the retropharyngeal space. Atheromatous irregularity about the right bifurcation/proximal right ICA with associated stenosis of up to 30% by NASCET criteria. Right ICA tortuous but otherwise patent to the skull base without stenosis, dissection or occlusion. Left carotid system: Small amount of intraluminal thrombus seen protruding into the left common carotid artery at its origin (series 13, image 309). Left CCA patent from its origin to the bifurcation without flow-limiting stenosis. Medialization of the left common carotid artery into the retropharyngeal space. Mild atheromatous irregularity about the left carotid bifurcation without hemodynamically significant stenosis (no more than 25%). Left ICA tortuous but otherwise widely patent to the skull base without stenosis, dissection, or occlusion. Vertebral arteries: Both vertebral arteries arise from the subclavian arteries. Vertebral arteries mildly tortuous but widely patent within the neck without stenosis, dissection, or occlusion. Skeleton: No acute osseous abnormality. No discrete osseous lesions. 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 Patient is edentulous. Other neck: No other acute soft tissue abnormality within the neck. Approximate 19 mm left thyroid nodule noted, with additional 1 cm adjacent left thyroid nodule. Right thyroid gland hypoplastic. Upper chest: Visualized upper chest demonstrates  no other acute finding. Emphysematous changes noted. Review of the MIP images confirms the above findings CTA HEAD FINDINGS Anterior circulation: Petrous segments widely patent bilaterally. Scattered atheromatous irregularity within the cavernous/supraclinoid ICAs without hemodynamically significant stenosis. ICA termini well perfused. A1 segments widely patent. Normal anterior communicating artery. Anterior cerebral arteries  patent to their distal aspects without stenosis. Right M1 widely patent. Normal right MCA bifurcation. There is apparent abrupt occlusion of a proximal right M3 branch at its origin, best seen on coronal reformatted images (series 14, image 112). Finding consistent with the evolving subacute right MCA territory infarct. Distal right MCA branches otherwise well perfused. Left M1 widely patent. Normal left MCA bifurcation. Distal left MCA branches well perfused. Posterior circulation: Vertebral arteries patent to the vertebrobasilar junction without stenosis. Left vertebral artery dominant. Posterior inferior cerebral arteries patent bilaterally. Basilar patent to its distal aspect without stenosis. Superior cerebral arteries patent bilaterally. Both of the posterior cerebral arteries primarily supplied via the basilar and are well perfused their distal aspects. Venous sinuses: Patent. Anatomic variants: None significant. Review of the MIP images confirms the above findings IMPRESSION: CT HEAD IMPRESSION: 1. Normal expected interval evolution of subacute right MCA territory infarcts, stable in size and distribution as compared to previous exams. 2. 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 No evidence for interval re-bleeding or significant mass effect. 3. Focal area of confluent hypodensity involving the high parasagittal right frontal lobe, stable from previous. Finding is  indeterminate, and could reflect sequelae of chronic ischemia or other insult. 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 CTA HEAD AND NECK IMPRESSION: 1. 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 2. 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 Finding could serve as an embolic source. 3. 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 4. Mild to moderate scattered atheromatous change elsewhere about the major arterial vasculature of the head and neck. No other hemodynamically significant or correctable stenosis identified. Electronically Signed   By: Jeannine Boga M.D.   On: 03/06/2019 03:59   Ct Chest W Contrast  Result Date: 03/01/2019 CLINICAL DATA:  Patient status post fall out of bed at 6 a.m. this morning. Bilateral lower extremity weakness. EXAM: CT CHEST, ABDOMEN, AND PELVIS WITH CONTRAST TECHNIQUE: Multidetector CT imaging of the chest, abdomen and pelvis was performed following the standard protocol during bolus administration of intravenous contrast. CONTRAST:  155mL OMNIPAQUE IOHEXOL 300 MG/ML  SOLN COMPARISON:  CT chest 11/03/2013.  PET CT scan 11/22/2013. FINDINGS: CT CHEST FINDINGS Cardiovascular: Pulmonary embolus are seen in right pulmonary arteries such as on image 33 of series 6 and 64-67 of series 6. Emboli are also seen in the aortic arch such as on images 21 and 22 of series 3 and 69 to 76 of series 7. Heart size is normal. No pleural or pericardial effusion. Calcific aortic and coronary atherosclerosis. Mediastinum/Nodes: No enlarged mediastinal, hilar, or axillary lymph nodes. Thyroid gland, trachea, and esophagus demonstrate no significant findings. Lungs/Pleura: Lungs are  emphysematous. There is scar in the left upper lobe at the site of patient's prior lung cancer. Mild dependent atelectasis is noted. Musculoskeletal: No acute or focal bony abnormality. CT ABDOMEN PELVIS FINDINGS Hepatobiliary: No focal liver abnormality is seen. No gallstones, gallbladder wall thickening, or biliary dilatation. The liver is low attenuating consistent with fatty infiltration. Pancreas: 0.4 cm hypoattenuating lesion at the junction of the tail and body on image 65 is likely due to some prior inflammatory process. No peripancreatic inflammatory change. No  pancreatic duct dilatation. Spleen: Wedge-shaped hypoattenuation in the superior aspect of the spleen measuring 2.7 cm AP x 2.7 cm transverse by 2.0 cm craniocaudal is consistent with infarct. Spleen is otherwise unremarkable. Adrenals/Urinary Tract: The adrenal glands appear normal. Wedge-shaped area peripheral hypoattenuation in the mid to upper pole of the right kidney is compatible with an infarct. More subtle area of decreased cortical attenuation in the more superior aspect of the right kidney is also worrisome for infarct. Left kidney appears normal. Ureters and urinary bladder are unremarkable. Stomach/Bowel: Stomach is within normal limits. Appendix appears normal. No evidence of bowel wall thickening, distention, or inflammatory changes. Vascular/Lymphatic: Aortic atherosclerosis. No enlarged abdominal or pelvic lymph nodes. Reproductive: Uterus and bilateral adnexa are unremarkable. Other: None. Musculoskeletal: No acute or focal abnormality. Degenerative disc disease L5-S1 noted. 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 Findings raise the possibility for ventricular or atrial septal defect. No evidence of trauma. Fatty infiltration of the liver. Atherosclerosis. Emphysema. These results were called by telephone at the time of interpretation on 03/01/2019 at 7:29 pm to  [facility], who verbally acknowledged these results. Electronically Signed   By: Inge Rise M.D.   On: 03/01/2019 19:36   Mr Jeri Cos IB Contrast  Result Date: 03/07/2019 CLINICAL DATA:  73 year old female with right hemisphere/right MCA territory infarct detected by head CT since 03/02/2019. Right MCA M3 occlusion on CTA yesterday. History of HIV and lung cancer. EXAM: MRI HEAD WITHOUT AND WITH CONTRAST TECHNIQUE: Multiplanar, multiecho pulse sequences of the brain and surrounding structures were obtained without and with intravenous contrast. CONTRAST:  10 milliliters Gadavist COMPARISON:  CTA head and neck 03/06/2019. Head CTs 03/03/2019 and earlier. Brain MRI 11/22/2013. FINDINGS: Study is intermittently degraded by motion artifact despite repeated imaging attempts. Brain: Confluent 3 centimeter area of restricted diffusion at the junction of the posterior right temporal lobe, anterior right occipital lobe and inferior right parietal lobe corresponding to the dominant area of hypodensity by CT recently. Associated T2 and FLAIR hyperintensity compatible with cytotoxic edema. Superimposed patchy restricted diffusion in the right periatrial white matter, and small oval petechial hemorrhage in the posterior right lentiform with surrounding restricted diffusion (stable and 10 millimeters on series 13, image 13 today). Restricted diffusion in the nearby right corona radiata and posterior right insula. Multiple small cortical and subcortical white matter foci of restricted diffusion elsewhere in the posterior right MCA territory (series 5 images 80 through 85), and also in the right frontal operculum. No restricted diffusion in the contralateral left hemisphere. Although there is a questionable punctate area of acute to subacute diffusion restriction in the right cerebellum on series 5, image 55. No other blood products identified. Abnormal T2 and FLAIR hyperintensity in the medial superior right  frontal gyrus new since 2015 and seen by CT is facilitated on diffusion, but is associated with nodular heterogeneous enhancement which appears to be partially extra-axial (series 9, image 11 and series 16, image 25), although only 1 postcontrast sequence could be obtained. This extra-axial component of the lesion is dark on T2 imaging, and the underlying gyrus is mildly expanded. This is new since 2015. Elsewhere there is post ischemic enhancement with some intrinsic T1 hyperintense blood products in the right lentiform. There is mild post ischemic enhancement at the most confluent area of restricted diffusion. There is also punctate enhancement suggested at the right frontal operculum on series 16, image 19 which is probably corresponding  to a small cortical infarct on series 5, image 83. No other abnormal intracranial enhancement is identified. No superimposed midline shift, ventriculomegaly. The left deep gray nuclei and brainstem appear to remain normal. Mild scattered chronic cerebral white matter T2 and FLAIR hyperintensity elsewhere is stable since 2015. Cervicomedullary junction and pituitary are within normal limits. Vascular: Major intracranial vascular flow voids are stable since 2015. Skull and upper cervical spine: Nonspecific decreased T1 signal in the upper cervical vertebrae since 2015. No suspicious skull bone marrow signal changes, including overlying the right superior frontal gyrus lesion. Sinuses/Orbits: Chronic left orbital floor fracture, stable. Other: Mastoids are clear. IMPRESSION: 1. Nodular and enhancing right superior frontal gyrus and/or extra-axial mass measuring about 15 mm with mild regional vasogenic edema is new since the 2015 MRI. Only 1 motion degraded post-contrast sequence could be obtained today. Given this patient's medical history differential considerations in lung cancer metastasis, HIV related infection, CNS lymphoma, or less likely meningioma. A repeat Brain MRI without  and with contrast when the patient can better cooperate might refine the differential diagnosis. 2. Scattered acute infarcts in the right hemisphere as on CT, mostly in the right MCA territory. Stable associated petechial hemorrhage in the posterior right lentiform. Possible punctate infarct in the right cerebellum, but no left-side vascular territory involvement. 3. No significant intracranial mass effect. Electronically Signed   By: Genevie Ann M.D.   On: 03/07/2019 06:24   Ct Abdomen Pelvis W Contrast  Result Date: 03/01/2019 CLINICAL DATA:  Patient status post fall out of bed at 6 a.m. this morning. Bilateral lower extremity weakness. EXAM: CT CHEST, ABDOMEN, AND PELVIS WITH CONTRAST TECHNIQUE: Multidetector CT imaging of the chest, abdomen and pelvis was performed following the standard protocol during bolus administration of intravenous contrast. CONTRAST:  161mL OMNIPAQUE IOHEXOL 300 MG/ML  SOLN COMPARISON:  CT chest 11/03/2013.  PET CT scan 11/22/2013. FINDINGS: CT CHEST FINDINGS Cardiovascular: Pulmonary embolus are seen in right pulmonary arteries such as on image 70 of series 6 and 64-67 of series 6. Emboli are also seen in the aortic arch such as on images 21 and 22 of series 3 and 69 to 76 of series 7. Heart size is normal. No pleural or pericardial effusion. Calcific aortic and coronary atherosclerosis. Mediastinum/Nodes: No enlarged mediastinal, hilar, or axillary lymph nodes. Thyroid gland, trachea, and esophagus demonstrate no significant findings. Lungs/Pleura: Lungs are emphysematous. There is scar in the left upper lobe at the site of patient's prior lung cancer. Mild dependent atelectasis is noted. Musculoskeletal: No acute or focal bony abnormality. CT ABDOMEN PELVIS FINDINGS Hepatobiliary: No focal liver abnormality is seen. No gallstones, gallbladder wall thickening, or biliary dilatation. The liver is low attenuating consistent with fatty infiltration. Pancreas: 0.4 cm hypoattenuating lesion  at the junction of the tail and body on image 65 is likely due to some prior inflammatory process. No peripancreatic inflammatory change. No pancreatic duct dilatation. Spleen: Wedge-shaped hypoattenuation in the superior aspect of the spleen measuring 2.7 cm AP x 2.7 cm transverse by 2.0 cm craniocaudal is consistent with infarct. Spleen is otherwise unremarkable. Adrenals/Urinary Tract: The adrenal glands appear normal. Wedge-shaped area peripheral hypoattenuation in the mid to upper pole of the right kidney is compatible with an infarct. More subtle area of decreased cortical attenuation in the more superior aspect of the right kidney is also worrisome for infarct. Left kidney appears normal. Ureters and urinary bladder are unremarkable. Stomach/Bowel: Stomach is within normal limits. Appendix appears normal. No evidence of bowel wall  thickening, distention, or inflammatory changes. Vascular/Lymphatic: Aortic atherosclerosis. No enlarged abdominal or pelvic lymph nodes. Reproductive: Uterus and bilateral adnexa are unremarkable. Other: None. Musculoskeletal: No acute or focal abnormality. Degenerative disc disease L5-S1 noted. 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 Findings raise the possibility for ventricular or atrial septal defect. No evidence of trauma. Fatty infiltration of the liver. Atherosclerosis. Emphysema. These results were called by telephone at the time of interpretation on 03/01/2019 at 7:29 pm to [facility], who verbally acknowledged these results. Electronically Signed   By: Inge Rise M.D.   On: 03/01/2019 19:36   Dg Chest Port 1 View  Result Date: 03/03/2019 CLINICAL DATA:  Respiratory failure EXAM: PORTABLE CHEST 1 VIEW COMPARISON:  03/01/2019 FINDINGS: Cardiac shadow is mildly enlarged but stable. Aortic calcifications are again seen. The lungs are well aerated bilaterally. Increased density in the lateral  aspect of the right lung base is noted likely related to the patient's known pulmonary emboli. Irregularity of the fifth and sixth ribs on the left laterally is seen. These were not present on prior chest x-rays from 2016 and likely related to the patient's known history of prior lung carcinoma with mild healing. No acute fracture is seen. IMPRESSION: Slight increase in parenchymal opacity in the right lung base laterally likely related to the known underlying pulmonary emboli. Old deformities of the fifth and sixth ribs on the left likely related to the patient's known history of prior lung carcinoma. Electronically Signed   By: Inez Catalina M.D.   On: 03/03/2019 06:10   Dg Chest Port 1 View  Result Date: 02/14/2019 CLINICAL DATA:  Shortness of breath EXAM: PORTABLE CHEST 1 VIEW COMPARISON:  02/13/2019 FINDINGS: Cardiac shadow is stable. The lungs are well aerated bilaterally. No focal infiltrate or sizable effusion is noted. No bony abnormality is seen. IMPRESSION: No acute abnormality noted. Electronically Signed   By: Inez Catalina M.D.   On: 02/14/2019 07:51   Dg Chest Port 1 View  Result Date: 02/13/2019 CLINICAL DATA:  Shortness of breath. Respiratory failure and hypoxia. EXAM: PORTABLE CHEST 1 VIEW COMPARISON:  02/12/2019 FINDINGS: Prominent main pulmonary artery. Mild enlargement of the cardiopericardial silhouette indistinct opacity along the lingula. Left midlung scarring. Peripheral left basilar bandlike density. The right lung appears clear. Chronic deformity of the left lateral fifth and sixth ribs. IMPRESSION: 1. New indistinct lingular opacity peripherally. This could be from a pleural process or airspace opacity. 2. Continued peripheral scarring in the left mid lung with adjacent chronic rib deformities. 3. Mild enlargement of the cardiopericardial silhouette, without edema. 4. Prominent main pulmonary artery suggesting pulmonary arterial hypertension. Electronically Signed   By: Van Clines M.D.   On: 02/13/2019 08:03   Dg Chest Port 1 View  Result Date: 02/12/2019 CLINICAL DATA:  Acute chronic respiratory failure. EXAM: PORTABLE CHEST 1 VIEW COMPARISON:  02/11/2019 FINDINGS: Heart size is normal. No pleural effusion or edema. Stable chronic scarring within the left midlung. No superimposed airspace consolidation. IMPRESSION: 1. No active disease.  Unchanged left midlung scarring. Electronically Signed   By: Kerby Moors M.D.   On: 02/12/2019 08:28   Dg Chest Port 1 View  Result Date: 02/11/2019 CLINICAL DATA:  Shortness of breath. EXAM: PORTABLE CHEST 1 VIEW COMPARISON:  Chest x-rays dated 03/29/2015 and 01/11/2012. FINDINGS: Borderline cardiomegaly is stable. Fullness of the LEFT aortopulmonary window region, most likely a prominent main pulmonary artery suggesting chronic pulmonary artery  hypertension. Increased interstitial prominence bilaterally, bibasilar predominant, LEFT slightly greater than RIGHT, presumably mild edema superimposed on chronic interstitial lung disease. No confluent opacity to suggest a consolidating pneumonia. No pleural effusion or pneumothorax seen. Osseous structures about the chest are unremarkable. IMPRESSION: 1. Probable mild bilateral interstitial edema superimposed on chronic interstitial lung disease. 2. No evidence of consolidating pneumonia or alveolar pulmonary edema. 3. Probable chronic pulmonary artery hypertension. 4. Stable borderline cardiomegaly. Electronically Signed   By: Franki Cabot M.D.   On: 02/11/2019 18:12   Dg Hips Bilat W Or Wo Pelvis 3-4 Views  Result Date: 03/01/2019 CLINICAL DATA:  Bilateral hip pain after fall today. EXAM: DG HIP (WITH OR WITHOUT PELVIS) 3-4V BILAT COMPARISON:  None. FINDINGS: There is no evidence of hip fracture or dislocation. There is no evidence of arthropathy or other focal bone abnormality. IMPRESSION: Negative. Electronically Signed   By: Marijo Conception M.D.   On: 03/01/2019 14:45   Vas US  Carotid (at [place] Only)  Result Date: 03/02/2019 Carotid Arterial Duplex Study Indications:       CVA. Risk Factors:      None. Limitations        Today's exam was limited due to the body habitus of the                    patient, patient movement, patient positioning and the                    patient's respiratory variation. Comparison Study:  No prior studies. Performing Technologist: Oliver Hum RVT  Examination Guidelines: A complete evaluation includes B-mode imaging, spectral Doppler, color Doppler, and power Doppler as needed of all accessible portions of each vessel. Bilateral testing is considered an integral part of a complete examination. Limited examinations for reoccurring indications may be performed as noted.  Right Carotid Findings: +----------+--------+--------+--------+-----------------------+--------+           PSV cm/sEDV cm/sStenosisPlaque Description     Comments +----------+--------+--------+--------+-----------------------+--------+ CCA Prox  68      13              smooth and heterogenous         +----------+--------+--------+--------+-----------------------+--------+ CCA Distal65      11              smooth and heterogenous         +----------+--------+--------+--------+-----------------------+--------+ ICA Prox  58      14              smooth and heterogenoustortuous +----------+--------+--------+--------+-----------------------+--------+ ICA Distal57      22                                     tortuous +----------+--------+--------+--------+-----------------------+--------+ ECA       60      11                                              +----------+--------+--------+--------+-----------------------+--------+ +----------+--------+-------+--------+-------------------+           PSV cm/sEDV cmsDescribeArm Pressure (mmHG) +----------+--------+-------+--------+-------------------+ [id number]                                         +----------+--------+-------+--------+-------------------+ +---------+--------+--+--------+--+---------+  VertebralPSV cm/s73EDV cm/s24Antegrade +---------+--------+--+--------+--+---------+  Left Carotid Findings: +----------+--------+--------+--------+-----------------------+--------+           PSV cm/sEDV cm/sStenosisPlaque Description     Comments +----------+--------+--------+--------+-----------------------+--------+ CCA Prox  101     19              smooth and heterogenous         +----------+--------+--------+--------+-----------------------+--------+ CCA Distal79      16              smooth and heterogenous         +----------+--------+--------+--------+-----------------------+--------+ ICA Prox  80      25              smooth and heterogenoustortuous +----------+--------+--------+--------+-----------------------+--------+ ICA Distal60      29                                     tortuous +----------+--------+--------+--------+-----------------------+--------+ ECA       66                                                      +----------+--------+--------+--------+-----------------------+--------+ +----------+--------+--------+--------+-------------------+           PSV cm/sEDV cm/sDescribeArm Pressure (mmHG) +----------+--------+--------+--------+-------------------+ [id number]                                         +----------+--------+--------+--------+-------------------+ +---------+--------+--+--------+--+---------+ VertebralPSV cm/s72EDV cm/s27Antegrade +---------+--------+--+--------+--+---------+  Summary: Right Carotid: Velocities in the right ICA are consistent with a 1-39% stenosis. Left Carotid: Velocities in the left ICA are consistent with a 1-39% stenosis. Vertebrals: Bilateral vertebral arteries demonstrate antegrade flow. *See table(s) above for measurements and observations.  Electronically signed by Antony Contras MD  on 03/02/2019 at 1:16:38 PM.    Final    Vas [name] Lower Extremity Venous (dvt)  Result Date: 03/02/2019  Lower Venous Study Indications: Pulmonary embolism, and stroke.  Risk Factors: None identified. Limitations: Body habitus, poor ultrasound/tissue interface and patient movement, patient positioning. Comparison Study: No prior studies. Performing Technologist: Oliver Hum RVT  Examination Guidelines: A complete evaluation includes B-mode imaging, spectral Doppler, color Doppler, and power Doppler as needed of all accessible portions of each vessel. Bilateral testing is considered an integral part of a complete examination. Limited examinations for reoccurring indications may be performed as noted.  +---------+---------------+---------+-----------+----------+--------------+ RIGHT    CompressibilityPhasicitySpontaneityPropertiesThrombus Aging +---------+---------------+---------+-----------+----------+--------------+ CFV      Full           Yes      Yes                                 +---------+---------------+---------+-----------+----------+--------------+ SFJ      Full                                                        +---------+---------------+---------+-----------+----------+--------------+ FV Prox  Full                                                        +---------+---------------+---------+-----------+----------+--------------+  FV Mid   Full                                                        +---------+---------------+---------+-----------+----------+--------------+ FV DistalFull                                                        +---------+---------------+---------+-----------+----------+--------------+ PFV      Full                                                        +---------+---------------+---------+-----------+----------+--------------+ POP      Full           Yes      Yes                                  +---------+---------------+---------+-----------+----------+--------------+ PTV      Full                                                        +---------+---------------+---------+-----------+----------+--------------+ PERO     Full                                                        +---------+---------------+---------+-----------+----------+--------------+   +---------+---------------+---------+-----------+----------+--------------+ LEFT     CompressibilityPhasicitySpontaneityPropertiesThrombus Aging +---------+---------------+---------+-----------+----------+--------------+ CFV      Full           Yes      Yes                                 +---------+---------------+---------+-----------+----------+--------------+ SFJ      Full                                                        +---------+---------------+---------+-----------+----------+--------------+ FV Prox  Full                                                        +---------+---------------+---------+-----------+----------+--------------+ FV Mid   Full                                                        +---------+---------------+---------+-----------+----------+--------------+  FV DistalFull                                                        +---------+---------------+---------+-----------+----------+--------------+ PFV      Full                                                        +---------+---------------+---------+-----------+----------+--------------+ POP      Full           Yes      Yes                                 +---------+---------------+---------+-----------+----------+--------------+ PTV      Full                                                        +---------+---------------+---------+-----------+----------+--------------+ PERO     Full                                                         +---------+---------------+---------+-----------+----------+--------------+     Summary: Right: There is no evidence of deep vein thrombosis in the lower extremity. No cystic structure found in the popliteal fossa. Left: There is no evidence of deep vein thrombosis in the lower extremity. No cystic structure found in the popliteal fossa.  *See table(s) above for measurements and observations. Electronically signed by Servando Snare MD on 03/02/2019 at 3:40:30 PM.    Final    [name] Ekg Site Rite  Result Date: 03/03/2019 If Site Rite image not attached, placement could not be confirmed due to current cardiac rhythm.   TEE 03/08/2019 Moderate LVH. Normal LV systolic function. No thrombus or intracardiac masses are seen. The right ventricle is moderately dilated, with preserved systolic function. The atrial septum is aneurysmal. Although a PFO was not identified with color Doppler imaging, there was a small right to left shunt identified by saline contrast. RECOMMENDATIONS:   High suspicion for paradoxical embolism. Consider lifelong anticoagulation.  PHYSICAL EXAM      Temp:  [97.7 F (36.5 C)-98.9 F (37.2 C)] 98.3 F (36.8 C) (09/09 1224) Pulse Rate:  [95-102] 99 (09/09 0955) Resp:  [12-26] 21 (09/09 1224) BP: (91-138)/(46-73) 126/62 (09/09 0952) SpO2:  [87 %-97 %] 90 % (09/09 0955) Weight:  [100.7 kg] 100.7 kg (09/09 0840)  General - Well nourished, well developed, in distress for body pain.  Ophthalmologic - fundi not visualized due to noncooperation.  Cardiovascular - Regular rate and rhythm.  Neuro - awake alert, orientated to place, people and self, but not to time. No aphasia, but paucity of speech, able to name and repeat. Mild dysarthria. PERRL, EOMI, no neglect. Blinking to visual threat bilaterally. Left facial nasolabial fold flattening. Tongue midline. LUE and LLE 4/5  and RUE and RLE 5/5. Sensation symmetrical. FTN intact bilaterally. Gait not tested.   ASSESSMENT/PLAN [name] is a 72 y.o. female with history of non-small cell lung cancer s/p radiation therapy, HIV, Hep B, COPD, HLD, CKD stg 3, and legally blind who presented with fall, generalized weakness, and chest pain, found to have embolic infarcts in R MCA territory with subacute hemorrhagic component along with PE and prominent thrombus in aortic arch.  Stroke: Acute R MCA territory infarcts, with hemorrhagic conversion at BG, embolic pattern likely paradoxical emboli due to PFO in setting of PE vs. Hypercoagulable state given new brain mass and hx of cancer  CT head - Acute right MCA territory ischemic infarct. 10 mm hemorrhage in the right basal ganglia. No midline shift or hydrocephalus.  MRI right MCA infarcts and HT at right BG  CTA head and neck right M3 occlusion, again seen aortic arch thrombus  MRI w/w/o  Scattered R brain infarcts w/ stable petechial hemorrhage posterior R lentiform. Possible punctate R cerebellar infarct.  2D Echo EF > 65%, moderate pulmonary HTN, no PFO seen  Carotid Doppler unremarkable  LE venous Doppler negative for DVT  TEE small R to L shunt by saline contrast, aneurysmal IAS  LDL 48  HgbA1c 6.4   Hypercoagulable work-up - 03/01/19 - negative (protein S low 52)  UDS - 03/04/19 - negative  IV heparin for VTE ppx  ASA 81 prior to admission, now on eliquis due to PE, aortic arch thrombus and embolism. Agree with continuing anticoagulation lifelong  Therapy recommendations:  SNF  Disposition: pending  Embolism - Pulmonary, Aortic  CT chest showed primary emboli and emboli in aortic arch  CT abdomen pelvis showed right renal infarcts as well as splenic infarct.  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 Life-long AC recommended by cards  Acute hypercapnic respiratory failure  rapid response was called 9/4  early am for sudden mental status change  BP in 50s with agonal breathing  ABG PCO2 80  History of COPD  Treated with BiPAP, much improved  Management as per primary team  HIV  CD4 count pending  Viral load 30  CD4 on 02/13/2019 was 406  Low suspicious for PML given low viral load and no significant cerebral edema on CT  Brain mass  MRI brain with and without contrast Nodular and enhancing right superior frontal gyrus and/or extra-axial mass with mild regional vasogenic edema is new since the 2015 MRI.  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  Will need to repeat MRI with and without contrast once more cooperative.  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 Pt agitation and restless and body aches ? part of the substance withdraw syndrome  Substance cessation education will be provided  Tobacco abuse / COPD  Current smoker  Smoking cessation  counseling will be provided  Other Stroke Risk Factors  Advanced age  Obesity, Body mass index is 32.78 kg/m., recommend weight loss, diet and exercise as appropriate    Other active issues  Acute thrombocytopenia - platelet 121->175  Neuropathy in legs  CKD stage 2-3Severe COPD  +UA / UCx > 100K aerococcus  Hepatitis B  Legally blind  Agitation - Zyprexa per Palliative Care. (also has prn Haldol available)  Pain - Oxycodone, gabapentin and Robaxin per [facility] day #7  Neurology will  sign off. Please call with questions. Pt will follow up with stroke clinic Dr Leonie Man at [facility] in about 4 weeks and repeat MRI if not done by then. Thanks for the consult.   Rosalin Hawking, MD PhD Stroke Neurology 03/08/2019 2:32 PM   To contact Stroke Continuity provider, please refer to [email or web address] After hours, contact General Neurology

## 2019-03-08 NOTE — Progress Notes (Signed)
  Echocardiogram Echocardiogram Transesophageal has been performed.  [name] 03/08/2019, 9:49 AM

## 2019-03-08 NOTE — Op Note (Signed)
INDICATIONS: embolic stroke  PROCEDURE:   Informed consent was obtained prior to the procedure. The risks, benefits and alternatives for the procedure were discussed and the patient comprehended these risks.  Risks include, but are not limited to, cough, sore throat, vomiting, nausea, somnolence, esophageal and stomach trauma or perforation, bleeding, low blood pressure, aspiration, pneumonia, infection, trauma to the teeth and death.    After a procedural time-out, the oropharynx was anesthetized with 20% benzocaine spray.   During this procedure the patient was administered IV propofol by Anesthesiology (Dr. Valma Cava).  The transesophageal probe was inserted in the esophagus and stomach without difficulty and multiple views were obtained.  The patient was kept under observation until the patient left the procedure room.  The patient left the procedure room in stable condition.   Agitated microbubble saline contrast was administered.  COMPLICATIONS:    There were no immediate complications.  FINDINGS:  Moderate LVH. Normal LV systolic function. No thrombus or intracardiac masses are seen. The right ventricle is moderately dilated, with preserved systolic function. The atrial septum is aneurysmal. Although a PFO was not identified with color Doppler imaging, there was a small right to left shunt identified by saline contrast.  RECOMMENDATIONS:    High suspicion for paradoxical embolism. Consider lifelong anticoagulation.  Time Spent Directly with the Patient:  30 minutes   Brigett Estell 03/08/2019, 9:38 AM

## 2019-03-09 ENCOUNTER — Encounter (HOSPITAL_COMMUNITY): Payer: Self-pay | Admitting: Cardiovascular Disease

## 2019-03-09 LAB — BASIC METABOLIC PANEL
Anion gap: 9 (ref 5–15)
BUN: 12 mg/dL (ref 8–23)
CO2: 31 mmol/L (ref 22–32)
Calcium: 8.6 mg/dL — ABNORMAL LOW (ref 8.9–10.3)
Chloride: 100 mmol/L (ref 98–111)
Creatinine, Ser: 0.85 mg/dL (ref 0.44–1.00)
GFR calc Af Amer: >60 mL/min (ref >60)
GFR calc non Af Amer: >60 mL/min (ref >60)
Glucose, Bld: 242 mg/dL — ABNORMAL HIGH (ref 70–99)
Potassium: 3.5 mmol/L (ref 3.5–5.1)
Sodium: 140 mmol/L (ref 135–145)

## 2019-03-09 LAB — SARS CORONAVIRUS 2 (TAT 6-24 HRS): SARS Coronavirus 2: NEGATIVE

## 2019-03-09 MED ORDER — POLYETHYLENE GLYCOL 3350 17 G PO PACK
17.0000 g | PACK | Freq: Every day | ORAL | Status: DC
  Administered 2019-03-09 – 2019-03-10 (×2): 17 g via ORAL
  Filled 2019-03-09 (×2): qty 1

## 2019-03-09 NOTE — Progress Notes (Signed)
SLP Cancellation Note  Patient Details Name: [name] MRN: [id number] DOB: 1946/04/08   Cancelled treatment:       Reason Eval/Treat Not Completed: Patient at procedure or test/unavailable(Pt with NT at this time to use the bedside commode. SLP will re-attempt today as able.)  Jerremy Maione I. Hardin Negus, [place], [place] Office number [phone] Pager [place] 03/09/2019, 12:26 PM

## 2019-03-09 NOTE — TOC Progression Note (Signed)
Transition of Care [facility]) - Progression Note    Patient Details  Name: CACHE DECOURSEY MRN: [id number] Date of Birth: 1946-03-29  Transition of Care [facility]) CM/SW [place], [place] Phone Number: 03/09/2019, 3:46 PM  Clinical Narrative:   CSW following for discharge plan. CSW reached out to [place] to ask about the decline, Miquel Dunn had initially declined due to patient's combative behaviors from a few days prior requiring restraints and a sitter, but CSW explained that patient has improved and is doing really well now. Miquel Dunn also is not able to obtain one of patient's medications from the pharmacy, but CSW spoke with family and they can bring it from home. CSW met with patient's niece, Charleston Ropes, who has flown up from [place] to assist with getting the patient what she needs. Charleston Ropes will search for medication at patient's home, and will take patient's things to SNF when ready. CSW completed PASRR interview with [facility], patient's PASRR is pending. Miquel Dunn has started [facility].    Expected Discharge Plan: Skilled Nursing Facility Barriers to Discharge: [facility], [place] (PASRR), Continued Medical Work up  Expected Discharge Plan and Services Expected Discharge Plan: [place] In-house Referral: Clinical Social Work Discharge Planning Services: NA Post Acute Care Choice: [place] Living arrangements for the past 2 months: Single Family Home                 DME Arranged: N/A DME Agency: NA       HH Arranged: NA HH Agency: NA         Social Determinants of Health (SDOH) Interventions    Readmission Risk Interventions No flowsheet data found.

## 2019-03-09 NOTE — Progress Notes (Signed)
Patient refused use of BIPAP for the evening. Currently on 4L HFNC. Will continue to monitor patient.

## 2019-03-09 NOTE — Plan of Care (Signed)
Patient verbalized understanding of Eliquis teaching done by pharmacy and RN. Patient continues to progress toward discharge.

## 2019-03-09 NOTE — Progress Notes (Signed)
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  CT of the chest abdomen and pelvis showed pulmonary emboli and embolus in the right aortic arch with right renal infarcts and splenic infarct.  Subsequently a CT of the head showed acute right MCA territory infarct with 10 mm  Hemorrhagic focus in right basal ganglia.  Neurology consulted for stroke evaluation. They have recommended to continue with NOAC for life long and outpatient follow up . \mewnwhile PT eval recommending SNF. Awaiting for placement. SNF requesting for repeat COVID 19 screening test. No new complaints.     Assessment & Plan:   Principal Problem:   PE (pulmonary thromboembolism) ([place]) Active Problems:   Human immunodeficiency virus (HIV) disease ([place])   Hypertension   Cigarette smoker   Acute hypoxemic respiratory failure (HCC)   Non-small cell lung cancer (HCC)   COPD (chronic obstructive pulmonary disease) (HCC)   Tobacco abuse   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HCC)   CKD (chronic kidney disease), stage III ([place])   Fall at home   Generalized weakness   CVA (cerebral vascular accident) ([place])   Altered mental status   Acute pulmonary embolism without acute cor pulmonale (HCC)   Acute ischemic right MCA stroke ([place])   Palliative care encounter   Aortic embolism or thrombosis ([place])   DNR (do not resuscitate)   Acute hypercapnic respiratory failure secondary to COPD flare in addition to pulmonary embolus and possibly from narcotic use. Much improved after BiPAP and currently is on 2 L of nasal  cannula oxygen. Continue with duo nebs as needed and Dulera. No new complaints.     Right MCA territory infarcts ,  Right cerebellum infarct.  Weakness on the left side with sensory deficits left facial droop. No new deficits.   Reviewed CT angiogram of the head and neck Hypercoagulable work-up ordered showed negative cardiolipin,  normal protein C, beta-2 glycoprotein is negative, homocystine is normal.  Antithrombin 3 is normal. Further testing is pending.  Echocardiogram ordered and reviewed. Continue with Eliquis. Repeat evaluation recommending SNF on discharge. LDL 103 Hemoglobin A1c 6.4. Appreciate neurology recommendations.    TEE done today showed Moderate LVH. Normal LV systolic function. No thrombus or intracardiac masses are seen. The right ventricle is moderately dilated, with preserved systolic function. The atrial septum is aneurysmal. Although a PFO was not identified with color Doppler imaging, there was a small right to left shunt identified by saline contrast.  Cardiology recommends life long anti coagulation for high suspicion for paradoxical embolism.   Discussed the above with the patient.  Repeat MRI of the brain when pt co operates.    Hypo-kalemia replaced Repeat BMP shows normal potassium    Tobacco abuse Cessation counseling given.  nicotine patch ordered  History of HIV Continue with Atripla  daily at bedtime   Essential hypertension Well-controlled blood pressure parameters.  Stage III CKD Creatinine at baseline.   Thrombocytopenia Improved platelet count. They are within normal limits at 175000 yesterday.   Aerococcus in urine cultures Patient asymptomatic. No further treatment.  Acute metabolic encephalopathy  Palliative care consulted,  and recommendations given.  patient is currently DNR, palliative care follow up on discharge.    Type 2 diabetes mellitus Hemoglobin A1c at 6.4. Diet controlled.   Non small cell lung CA:   S/p radiation.   Aortic thrombus  On eliquis.   DVT prophylaxis: SCDs Code Status: DNR Family Communication: None at bedside today Disposition Plan: SNF on discharge. possibly tomorrow.   Consultants:   Cardiology for TEE  Neurology  Procedures: TEE scheduled today Echocardiogram showed left ventricular ejection fraction within normal limits Dopplers parameters consistent with impaired relaxation, moderate pulmonary hypertension  Antimicrobials: None  Subjective: No new complaints. Looking forward to being discharged.  No chest pain or sob.   Objective: Vitals:   03/09/19 0600 03/09/19 0751 03/09/19 0758 03/09/19 0800  BP:   121/61   Pulse: 80  94   Resp: 18  (!) 29 18  Temp:   98.9 F (37.2 C)   TempSrc:   Oral   SpO2: 92% 90% (!) 88%   Weight:      Height:        Intake/Output Summary (Last 24 hours) at 03/09/2019 1315 Last data filed at 03/09/2019 0240 Gross per 24 hour  Intake 240 ml  Output 900 ml  Net -660 ml   Filed Weights   03/01/19 2000 03/08/19 0840  Weight: 100.7 kg 100.7 kg    Examination:  General exam: alert and comfortable.  Respiratory system: air entry fair, no wheezing or rhonchi.  Cardiovascular system:s1s2, RRR, Gastrointestinal system: Abdomen is soft NT ND BS+ Central nervous system: alert and oriented to person, place. Weakness of the left side improving.  Extremities: no pedal edema.  Skin: No rashes seen.  Psychiatry: mood appropriate.    Data Reviewed: I have personally reviewed following labs and imaging studies  CBC: Recent Labs  Lab 03/04/19 0332 03/05/19 0523 03/06/19 0412 03/07/19 0411  WBC 6.9 7.4 6.2 8.8  HGB 13.3 13.0 15.9* 14.8  HCT 50.0* 47.1* 57.7* 53.1*  MCV 89.1 85.6 84.9 85.0  PLT 115* 128* 121* 622   Basic Metabolic Panel: Recent Labs  Lab 03/03/19 1625 03/04/19 0332 03/07/19 0411 03/09/19 0900  NA 142 140 143 140  K 3.9 4.2 3.2* 3.5  CL 105 104 102 100  CO2 29 25 31 31   GLUCOSE 89 96  107* 242*  BUN 8 8 8 12   CREATININE 0.86 0.85 0.69 0.85  CALCIUM 8.2* 8.2* 9.2 8.6*   GFR: Estimated Creatinine Clearance: 76.7 mL/min (by C-G formula based on SCr of 0.85 mg/dL). Liver Function Tests: No results for input(s): AST, ALT, ALKPHOS, BILITOT, PROT, ALBUMIN in the last 168 hours. No results for input(s): LIPASE, AMYLASE in the last 168 hours. No results for input(s): AMMONIA in the last 168 hours. Coagulation Profile: No results for input(s): INR, PROTIME in the last 168 hours. Cardiac Enzymes: No results for input(s): CKTOTAL, CKMB, CKMBINDEX, TROPONINI in the last 168 hours. BNP (last 3 results) No results for input(s): PROBNP in the last 8760 hours. HbA1C: No results for input(s): HGBA1C in the last 72 hours. CBG: Recent Labs  Lab 03/03/19 0358 03/03/19 0546  GLUCAP 104* 98   Lipid Profile: No results for input(s): CHOL, HDL, LDLCALC, TRIG, CHOLHDL, LDLDIRECT in the last 72 hours. Thyroid Function Tests: No results for input(s): TSH, T4TOTAL, FREET4, T3FREE, THYROIDAB in the last 72 hours. Anemia Panel: No results for input(s): VITAMINB12, FOLATE, FERRITIN, TIBC, IRON, RETICCTPCT in the last 72 hours. Sepsis Labs: Recent Labs  Lab  03/03/19 0549 03/03/19 1625  PROCALCITON  --  <0.10  LATICACIDVEN 0.5  --     Recent Results (from the past 240 hour(s))  Urine culture     Status: Abnormal   Collection Time: 03/01/19  5:10 PM   Specimen: Urine, Random  Result Value Ref Range Status   Specimen Description URINE, RANDOM  Final   Special Requests NONE  Final   Culture (A)  Final    >=100,000 COLONIES/mL AEROCOCCUS SPECIES Standardized susceptibility testing for this organism is not available. Performed at [facility], [place] [address], [place], [place] [address]    Report Status 03/03/2019 FINAL  Final  SARS CORONAVIRUS 2 (TAT 6-24 HRS) Nasopharyngeal Nasopharyngeal Swab     Status: None   Collection Time: 03/01/19 10:10 PM   Specimen: Nasopharyngeal  Swab  Result Value Ref Range Status   SARS Coronavirus 2 NEGATIVE NEGATIVE Final    Comment: (NOTE) SARS-CoV-2 target nucleic acids are NOT DETECTED. The SARS-CoV-2 RNA is generally detectable in upper and lower respiratory specimens during the acute phase of infection. Negative results do not preclude SARS-CoV-2 infection, do not rule out co-infections with other pathogens, and should not be used as the sole basis for treatment or other patient management decisions. Negative results must be combined with clinical observations, patient history, and epidemiological information. The expected result is Negative. Fact Sheet for Patients: SugarRoll.be Fact Sheet for Healthcare Providers: https://www.woods-mathews.com/ This test is not yet approved or cleared by the [place] FDA and  has been authorized for detection and/or diagnosis of SARS-CoV-2 by FDA under an Emergency Use Authorization (EUA). This EUA will remain  in effect (meaning this test can be used) for the duration of the COVID-19 declaration under Section 56 4(b)(1) of the Act, 21 U.S.C. section 360bbb-3(b)(1), unless the authorization is terminated or revoked sooner. Performed at [facility], [place] [address] [place], [place] [address]   Culture, blood (Routine X 2) w Reflex to ID Panel     Status: None   Collection Time: 03/02/19  2:09 AM   Specimen: BLOOD RIGHT FOREARM  Result Value Ref Range Status   Specimen Description BLOOD RIGHT FOREARM  Final   Special Requests   Final    BOTTLES DRAWN AEROBIC AND ANAEROBIC Blood Culture results may not be optimal due to an inadequate volume of blood received in culture bottles   Culture   Final    NO GROWTH 5 DAYS Performed at [facility], [place] [address] [place], [place] [address]    Report Status 03/07/2019 FINAL  Final  Culture, blood (Routine X 2) w Reflex to ID Panel     Status: None   Collection Time: 03/02/19   5:25 AM   Specimen: BLOOD  Result Value Ref Range Status   Specimen Description BLOOD RIGHT ARM  Final   Special Requests   Final    BOTTLES DRAWN AEROBIC AND ANAEROBIC Blood Culture adequate volume   Culture   Final    NO GROWTH 5 DAYS Performed at [facility], [place] [address] [place],  [address]    Report Status 03/07/2019 FINAL  Final         Radiology Studies: No results found.      Scheduled Meds: .  stroke: mapping our early stages of recovery book   Does not apply Once  . acetaminophen  650 mg Oral Q8H  . apixaban  10 mg Oral BID   Followed by  . [  START ON 03/13/2019] apixaban  5 mg Oral BID  . calcium-vitamin D  1 tablet Oral Q breakfast  . DULoxetine  20 mg Oral Daily  . efavirenz-emtricitabine-tenofovir  1 tablet Oral QHS  . gabapentin  600 mg Oral TID  . mometasone-formoterol  2 puff Inhalation BID  . nicotine  21 mg Transdermal Daily  . OLANZapine  2.5 mg Oral QHS  . omega-3 acid ethyl esters  1 g Oral Daily  . sodium chloride flush  10-40 mL Intracatheter Q12H  . umeclidinium bromide  2 puff Inhalation Daily   Continuous Infusions:   LOS: 8 days    Time spent: 30 minutes.     Hosie Poisson, MD [facility] Pager [phone]   If 7PM-7AM, please contact night-coverage [email or web address] Password [email or web address] 03/09/2019, 1:15 PM

## 2019-03-10 MED ORDER — DULOXETINE HCL 20 MG PO CPEP: 20.0000 mg | ORAL_CAPSULE | Freq: Every day | ORAL | 3 refills | Status: DC

## 2019-03-10 MED ORDER — OLANZAPINE 2.5 MG PO TABS: 2.5000 mg | ORAL_TABLET | Freq: Every day | ORAL | 0 refills | Status: DC

## 2019-03-10 MED ORDER — POLYETHYLENE GLYCOL 3350 17 G PO PACK: 17.0000 g | PACK | Freq: Every day | ORAL | 0 refills | Status: DC

## 2019-03-10 MED ORDER — NICOTINE 21 MG/24HR TD PT24: 21.0000 mg | MEDICATED_PATCH | Freq: Every day | TRANSDERMAL | 0 refills | Status: DC

## 2019-03-10 MED ORDER — APIXABAN 5 MG PO TABS: 5.0000 mg | ORAL_TABLET | Freq: Two times a day (BID) | ORAL | 1 refills | Status: DC

## 2019-03-10 MED ORDER — APIXABAN 5 MG PO TABS: 10.0000 mg | ORAL_TABLET | Freq: Two times a day (BID) | ORAL | 0 refills | Status: DC

## 2019-03-10 NOTE — Progress Notes (Addendum)
Pt d/c to facility pt is stable, no s/s of distress. Education provided to pt, pt verbalize understanding. RN will call report to facility. Pt will be transported out of hospital  Report given to [facility].

## 2019-03-10 NOTE — Discharge Summary (Signed)
Physician Discharge Summary  [name] OAC:[id number] DOB: Oct 02, 1945 DOA: 03/01/2019  PCP: System, Provider Not In  Admit date: 03/01/2019 Discharge date: 03/10/2019  Admitted From: Home.  Disposition:  [facility].   Recommendations for Outpatient Follow-up:  1. Follow up with PCP in 1-2 weeks 2. Please obtain BMP/CBC in one week Please follow up [place] as recommended.  Follow up with palliative care on discharge.     Discharge Condition: guarded.  CODE STATUS: DNR.  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  CT of the chest abdomen and pelvis showed pulmonary emboli and embolus in the right aortic arch with right renal infarcts and splenic infarct.  Subsequently a CT of the head showed acute right MCA territory infarct with 10 mm  Hemorrhagic focus in right basal ganglia.  Neurology consulted for stroke evaluation. They have recommended to continue with NOAC for life long and outpatient follow up . \mewnwhile PT eval recommending SNF. Awaiting for placement. SNF requesting for repeat COVID 19 screening test. No new complaints.   Discharge Diagnoses:  Principal Problem:   PE (pulmonary thromboembolism) ([place]) Active Problems:   Human immunodeficiency virus (HIV) disease ([place])   Hypertension   Cigarette smoker   Acute hypoxemic respiratory failure (HCC)   Non-small cell lung cancer (HCC)   COPD (chronic obstructive pulmonary disease) (HCC)   Tobacco abuse   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HCC)   CKD (chronic kidney disease), stage III ([place])   Fall at home   Generalized weakness   CVA (cerebral vascular accident)  ([place])   Altered mental status   Acute pulmonary embolism without acute cor pulmonale (HCC)   Acute ischemic right MCA stroke ([place])   Palliative care encounter   Aortic embolism or thrombosis ([place])   DNR (do not resuscitate)  Acute hypercapnic respiratory failure secondary to COPD flare in addition to pulmonary embolus and possibly from narcotic use. Much improved after BiPAP and currently is on 2 to 4  L of nasal cannula oxygen. Continue with duo nebs as needed  No new complaints.     Right MCA territory infarcts ,  Right cerebellum infarct.  Weakness on the left side with sensory deficits left facial droop. No new deficits.   Reviewed CT angiogram of the head and neck Hypercoagulable work-up ordered showed negative cardiolipin,  normal protein C, beta-2 glycoprotein is negative, homocystine is normal.  Antithrombin 3 is normal. Further testing is pending.  Echocardiogram ordered and reviewed. Continue with Eliquis. Repeat evaluation recommending SNF on discharge. LDL 103 Hemoglobin A1c 6.4. Appreciate neurology recommendations.    TEE done today showed Moderate LVH. Normal LV systolic function. No thrombus or intracardiac masses are seen. The right ventricle is moderately dilated, with preserved systolic function. The atrial septum is aneurysmal. Although a PFO was not identified with color Doppler imaging, there was a small right to left shunt identified by saline contrast.  Cardiology recommends life long anti coagulation for high suspicion for paradoxical embolism.   Discussed the above with the patient.  Repeat MRI of the brain when pt co operates as outpatient.     Hypo-kalemia replaced Repeat BMP shows normal potassium    Tobacco abuse  Cessation counseling given.  nicotine patch ordered  History of HIV Continue with Atripla  daily at bedtime   Essential hypertension Well-controlled blood pressure parameters.  Stage III CKD Creatinine at  baseline.   Thrombocytopenia Improved platelet count. They are within normal limits at 175000 yesterday.   Aerococcus in urine cultures Patient asymptomatic. No further treatment.  Acute metabolic encephalopathy  Palliative care consulted, and recommendations given.  patient is currently DNR, palliative care follow up on discharge.    Type 2 diabetes mellitus Hemoglobin A1c at 6.4. Diet controlled.   Non small cell lung CA:  S/p radiation.   Aortic thrombus  On eliquis.   Discharge Instructions  Discharge Instructions    Ambulatory referral to Neurology   Complete by: As directed    Follow up with Dr. Leonie Man at [facility] in 4-6 weeks. Too complicated for NP to follow. Thanks.   Diet - low sodium heart healthy   Complete by: As directed    Discharge instructions   Complete by: As directed    Please follow up with neurology as recommended.  Please follow up with PCP in one week.     Allergies as of 03/10/2019   No Known Allergies     Medication List    STOP taking these medications   aspirin EC 81 MG tablet   ibuprofen 600 MG tablet Commonly known as: ADVIL   methocarbamol 500 MG tablet Commonly known as: Robaxin-750   predniSONE 20 MG tablet Commonly known as: DELTASONE   QUEtiapine 200 MG 24 hr tablet Commonly known as: SEROQUEL XR   tiZANidine 4 MG capsule Commonly known as: ZANAFLEX   venlafaxine XR 150 MG 24 hr capsule Commonly known as: EFFEXOR-XR     TAKE these medications   albuterol 108 (90 Base) MCG/ACT inhaler Commonly known as: VENTOLIN HFA Inhale 1 puff into the lungs every 6 (six) hours as needed for shortness of breath.   albuterol (2.5 MG/3ML) 0.083% nebulizer solution Commonly known as: PROVENTIL Take 3 mLs (2.5 mg total) by nebulization every 4 (four) hours as needed for wheezing or shortness of breath.   apixaban 5 MG Tabs tablet Commonly known as: ELIQUIS Take 2 tablets (10 mg total) by mouth 2 (two) times daily for 2  days.   apixaban 5 MG Tabs tablet Commonly known as: ELIQUIS Take 1 tablet (5 mg total) by mouth 2 (two) times daily. Start taking on: March 13, 2019   Atripla 600-200-300 MG tablet Generic drug: efavirenz-emtricitabine-tenofovir TAKE 1 TABLET BY MOUTH EVERY NIGHT AT BEDTIME   Calcium 600+D 600-400 MG-UNIT tablet Generic drug: Calcium Carbonate-Vitamin D Take 1 tablet by mouth daily.   DULoxetine 20 MG capsule Commonly known as: CYMBALTA Take 1 capsule (20 mg total) by mouth daily.   Fluticasone-Salmeterol 500-50 MCG/DOSE Aepb Commonly known as: ADVAIR Inhale 1 puff into the lungs every 12 (twelve) hours.   gabapentin 300 MG capsule Commonly known as: NEURONTIN TAKE 2 CAPSULES BY MOUTH THREE TIMES A DAY What changed: See the new instructions.   loratadine 10 MG tablet Commonly known as: CLARITIN Take 10 mg by mouth 2 (two) times daily.   montelukast 10 MG tablet Commonly known as: SINGULAIR Take 1 tablet (10 mg total) by mouth at bedtime.   nicotine 21 mg/24hr patch Commonly known as: NICODERM CQ - dosed in mg/24 hours Place 1 patch (21 mg total) onto the skin daily.   OLANZapine 2.5 MG tablet Commonly known as: ZYPREXA Take 1 tablet (2.5 mg total) by  mouth at bedtime.   omega-3 acid ethyl esters 1 g capsule Commonly known as: LOVAZA Take 1 g by mouth daily.   polyethylene glycol 17 g packet Commonly known as: MIRALAX / GLYCOLAX Take 17 g by mouth daily.   Spiriva Respimat 2.5 MCG/ACT Aers Generic drug: Tiotropium Bromide Monohydrate INHALE 2 PUFFS INTO THE LUNGS DAILY What changed: See the new instructions.   VITAMIN A PO Take 1 tablet by mouth daily.   VITAMIN B50 COMPLEX PO Take 1 tablet by mouth daily.   vitamin C 100 MG tablet Take 100 mg by mouth daily.      Follow-up Information    Your primary care provider Follow up in 1 week(s).        Garvin Fila, MD. Schedule an appointment as soon as possible for a visit in 4 week(s).    Specialties: Neurology, Radiology Contact information: [address] [place] [address] [phone]          No Known Allergies  Consultations:  Cardiology for TEE  Neurology   Procedures/Studies: Ct Angio Head W Or Wo Contrast  Result Date: 03/06/2019 CLINICAL DATA:  Follow-up examination for acute stroke. EXAM: CT ANGIOGRAPHY HEAD AND NECK TECHNIQUE: Multidetector CT imaging of the head and neck was performed using the standard protocol during bolus administration of intravenous contrast. Multiplanar CT image reconstructions and MIPs were obtained to evaluate the vascular anatomy. Carotid stenosis measurements (when applicable) are obtained utilizing NASCET criteria, using the distal internal carotid diameter as the denominator. CONTRAST:  41mL OMNIPAQUE IOHEXOL 350 MG/ML SOLN COMPARISON:  Prior CT from 03/03/2019. FINDINGS: CT HEAD FINDINGS Brain: Evolving subacute predominantly right MCA territory infarct involving the right parietotemporal region again seen, relatively stable in size and distribution from previous. Probable associated faint petechial hemorrhage without hemorrhagic transformation or significant mass effect. 10 mm subacute intraparenchymal hemorrhage at the right lentiform nucleus again seen as well, relatively similar in size, but with decreased attenuation as compared to previous. Mild localized edema without significant regional mass effect. No other new acute intracranial hemorrhage. No acute large vessel territory infarct. 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 No mass lesion or midline shift. No hydrocephalus. No extra-axial fluid collection. Vascular: No hyperdense vessel. Scattered calcified atherosclerosis at the skull base. Skull: Scalp soft tissues and calvarium within normal limits.  Sinuses: Trace layering opacity noted within the left sphenoid sinus. Paranasal sinuses are otherwise clear. No mastoid effusion. Orbits: Globes orbital soft tissues demonstrate no acute finding. Remote posttraumatic defect noted at the left orbital floor. Review of the MIP images confirms the above findings CTA NECK FINDINGS Aortic arch: Previously identified intraluminal thrombus protruding into the aortic arch again seen, grossly similar to prior chest CT (series 13, image 323, 328). Moderate atherosclerotic irregularity throughout the visualized arch itself. No hemodynamically significant stenosis seen about the origin of the great vessels. Visualized subclavian arteries widely patent. Right carotid system: Right CCA patent from its origin to the bifurcation without stenosis. Medial is a shin of the right common carotid artery into the retropharyngeal space. Atheromatous irregularity about the right bifurcation/proximal right ICA with associated stenosis of up to 30% by NASCET criteria. Right ICA tortuous but otherwise patent to the skull base without stenosis, dissection or occlusion. Left carotid system: Small amount of intraluminal thrombus seen protruding into the left common  carotid artery at its origin (series 13, image 309). Left CCA patent from its origin to the bifurcation without flow-limiting stenosis. Medialization of the left common carotid artery into the retropharyngeal space. Mild atheromatous irregularity about the left carotid bifurcation without hemodynamically significant stenosis (no more than 25%). Left ICA tortuous but otherwise widely patent to the skull base without stenosis, dissection, or occlusion. Vertebral arteries: Both vertebral arteries arise from the subclavian arteries. Vertebral arteries mildly tortuous but widely patent within the neck without stenosis, dissection, or occlusion. Skeleton: No acute osseous abnormality. No discrete osseous lesions. Moderate to advanced cervical  spondylolysis at C5-6 and C6-7. Patient is edentulous. Other neck: No other acute soft tissue abnormality within the neck. Approximate 19 mm left thyroid nodule noted, with additional 1 cm adjacent left thyroid nodule. Right thyroid gland hypoplastic. Upper chest: Visualized upper chest demonstrates no other acute finding. Emphysematous changes noted. Review of the MIP images confirms the above findings CTA HEAD FINDINGS Anterior circulation: Petrous segments widely patent bilaterally. Scattered atheromatous irregularity within the cavernous/supraclinoid ICAs without hemodynamically significant stenosis. ICA termini well perfused. A1 segments widely patent. Normal anterior communicating artery. Anterior cerebral arteries patent to their distal aspects without stenosis. Right M1 widely patent. Normal right MCA bifurcation. There is apparent abrupt occlusion of a proximal right M3 branch at its origin, best seen on coronal reformatted images (series 14, image 112). Finding consistent with the evolving subacute right MCA territory infarct. Distal right MCA branches otherwise well perfused. Left M1 widely patent. Normal left MCA bifurcation. Distal left MCA branches well perfused. Posterior circulation: Vertebral arteries patent to the vertebrobasilar junction without stenosis. Left vertebral artery dominant. Posterior inferior cerebral arteries patent bilaterally. Basilar patent to its distal aspect without stenosis. Superior cerebral arteries patent bilaterally. Both of the posterior cerebral arteries primarily supplied via the basilar and are well perfused their distal aspects. Venous sinuses: Patent. Anatomic variants: None significant. Review of the MIP images confirms the above findings IMPRESSION: CT HEAD IMPRESSION: 1. Normal expected interval evolution of subacute right MCA territory infarcts, stable in size and distribution as compared to previous exams. 2. Normal expected interval evolution of 10 mm right  basal ganglia hemorrhage, likely subacute right MCA territory infarct with hemorrhagic transformation. No evidence for interval re-bleeding or significant mass effect. 3. Focal area of confluent hypodensity involving the high parasagittal right frontal lobe, stable from previous. Finding is indeterminate, and could reflect sequelae of chronic ischemia or other insult. 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 CTA HEAD AND NECK IMPRESSION: 1. 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 2. 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 Finding could serve as an embolic source. 3. 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 4. Mild to moderate scattered atheromatous change elsewhere about the major arterial vasculature of the head and neck. No other hemodynamically significant or correctable stenosis identified. Electronically Signed   By: Jeannine Boga M.D.   On: 03/06/2019 03:59   Dg Chest 2 View  Result Date: 03/01/2019 CLINICAL DATA:  Anterior chest pain post fall. EXAM: CHEST - 2 VIEW COMPARISON:  February 14, 2019 FINDINGS: Cardiomediastinal silhouette is normal. Mediastinal contours appear intact. Tortuosity of the aorta. There is no evidence of pleural effusion or pneumothorax. Linear opacities  in the left mid thorax, possibly posttreatment or postsurgical changes. No radiographic evidence of displaced rib fractures. Soft tissues are grossly normal. IMPRESSION: 1. Linear opacities in the left mid thorax, possibly posttreatment or postsurgical changes. 2. No radiographic evidence of displaced rib fractures. Electronically Signed   By: Fidela Salisbury M.D.   On: 03/01/2019 14:48   Ct Head Wo  Contrast  Result Date: 03/03/2019 CLINICAL DATA:  Focal neuro deficit. Unresponsive. 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 EXAM: CT HEAD WITHOUT CONTRAST TECHNIQUE: Contiguous axial images were obtained from the base of the skull through the vertex without intravenous contrast. COMPARISON:  Head CT 03/02/2019 FINDINGS: Brain: A 10 mm hemorrhage in the right lentiform nucleus with mild surrounding edema is unchanged. Additional patchy areas of hypoattenuation involving cortex and white matter in the right temporal lobe, inferior right parietal lobe, and posteromedial right frontal lobe appears slightly more well-defined than on the prior study. No new intracranial hemorrhage, definite new infarct, midline shift, or extra-axial fluid collection is identified. The ventricles are normal in size. Background chronic small vessel ischemic changes are present in the cerebral white matter bilaterally. Vascular: Calcified atherosclerosis at the skull base. No hyperdense vessel. Skull: No acute fracture or focal osseous lesion. Sinuses/Orbits: Old left orbital floor fracture. Mild right periorbital soft tissue swelling. Mild mucosal thickening in the paranasal sinuses. Clear mastoid air cells. Other: None. IMPRESSION: Evolving acute right cerebral infarcts primarily in the MCA territory. Unchanged 10 mm right basal ganglia hemorrhage. Electronically Signed   By: Logan Bores M.D.   On: 03/03/2019 05:21   Ct Head Wo Contrast  Result Date: 03/02/2019 CLINICAL DATA:  [place] out of bed. EXAM: CT HEAD WITHOUT CONTRAST TECHNIQUE: Contiguous axial images were obtained from the base of the skull through the vertex without intravenous contrast. COMPARISON:  MR brain dated Nov 22, 2013. FINDINGS: Brain: There is hypodensity and loss of the gray-white matter differentiation predominantly involving the right temporal lobe, consistent with acute infarct. 8 x 8 x 10 mm area of hemorrhage in the right basal ganglia with surrounding  edema. No midline shift, hydrocephalus, or extra-axial collection. Vascular: Atherosclerotic vascular calcification of the carotid siphons. No hyperdense vessel. Skull: Normal. Negative for fracture or focal lesion. Sinuses/Orbits: No acute finding. Mild ethmoid air cell mucosal thickening. Other: None. IMPRESSION: 1. Acute right MCA territory ischemic infarct. 10 mm hemorrhage in the right basal ganglia. No midline shift or hydrocephalus. Critical Value/emergent results were called by telephone at the time of interpretation on 03/02/2019 at 7:55 am to Dr. Debbe Odea, who verbally acknowledged these results. Electronically Signed   By: Titus Dubin M.D.   On: 03/02/2019 07:59   Ct Angio Neck W Or Wo Contrast  Result Date: 03/06/2019 CLINICAL DATA:  Follow-up examination for acute stroke. EXAM: CT ANGIOGRAPHY HEAD AND NECK TECHNIQUE: Multidetector CT imaging of the head and neck was performed using the standard protocol during bolus administration of intravenous contrast. Multiplanar CT image reconstructions and MIPs were obtained to evaluate the vascular anatomy. Carotid stenosis measurements (when applicable) are obtained utilizing NASCET criteria, using the distal internal carotid diameter as the denominator. CONTRAST:  75mL OMNIPAQUE IOHEXOL 350 MG/ML SOLN COMPARISON:  Prior CT from 03/03/2019. FINDINGS: CT HEAD FINDINGS Brain: Evolving subacute predominantly right MCA territory infarct involving the right parietotemporal region again seen, relatively stable in size and distribution from previous. Probable associated faint petechial hemorrhage without hemorrhagic transformation or significant mass effect. 10 mm subacute intraparenchymal hemorrhage at the right lentiform  nucleus again seen as well, relatively similar in size, but with decreased attenuation as compared to previous. Mild localized edema without significant regional mass effect. No other new acute intracranial hemorrhage. No acute large vessel  territory infarct. 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 No mass lesion or midline shift. No hydrocephalus. No extra-axial fluid collection. Vascular: No hyperdense vessel. Scattered calcified atherosclerosis at the skull base. Skull: Scalp soft tissues and calvarium within normal limits. Sinuses: Trace layering opacity noted within the left sphenoid sinus. Paranasal sinuses are otherwise clear. No mastoid effusion. Orbits: Globes orbital soft tissues demonstrate no acute finding. Remote posttraumatic defect noted at the left orbital floor. Review of the MIP images confirms the above findings CTA NECK FINDINGS Aortic arch: Previously identified intraluminal thrombus protruding into the aortic arch again seen, grossly similar to prior chest CT (series 13, image 323, 328). Moderate atherosclerotic irregularity throughout the visualized arch itself. No hemodynamically significant stenosis seen about the origin of the great vessels. Visualized subclavian arteries widely patent. Right carotid system: Right CCA patent from its origin to the bifurcation without stenosis. Medial is a shin of the right common carotid artery into the retropharyngeal space. Atheromatous irregularity about the right bifurcation/proximal right ICA with associated stenosis of up to 30% by NASCET criteria. Right ICA tortuous but otherwise patent to the skull base without stenosis, dissection or occlusion. Left carotid system: Small amount of intraluminal thrombus seen protruding into the left common carotid artery at its origin (series 13, image 309). Left CCA patent from its origin to the bifurcation without flow-limiting stenosis. Medialization of the left common carotid artery into the retropharyngeal space. Mild atheromatous irregularity about the left carotid bifurcation without  hemodynamically significant stenosis (no more than 25%). Left ICA tortuous but otherwise widely patent to the skull base without stenosis, dissection, or occlusion. Vertebral arteries: Both vertebral arteries arise from the subclavian arteries. Vertebral arteries mildly tortuous but widely patent within the neck without stenosis, dissection, or occlusion. Skeleton: No acute osseous abnormality. No discrete osseous lesions. 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 Patient is edentulous. Other neck: No other acute soft tissue abnormality within the neck. Approximate 19 mm left thyroid nodule noted, with additional 1 cm adjacent left thyroid nodule. Right thyroid gland hypoplastic. Upper chest: Visualized upper chest demonstrates no other acute finding. Emphysematous changes noted. Review of the MIP images confirms the above findings CTA HEAD FINDINGS Anterior circulation: Petrous segments widely patent bilaterally. Scattered atheromatous irregularity within the cavernous/supraclinoid ICAs without hemodynamically significant stenosis. ICA termini well perfused. A1 segments widely patent. Normal anterior communicating artery. Anterior cerebral arteries patent to their distal aspects without stenosis. Right M1 widely patent. Normal right MCA bifurcation. There is apparent abrupt occlusion of a proximal right M3 branch at its origin, best seen on coronal reformatted images (series 14, image 112). Finding consistent with the evolving subacute right MCA territory infarct. Distal right MCA branches otherwise well perfused. Left M1 widely patent. Normal left MCA bifurcation. Distal left MCA branches well perfused. Posterior circulation: Vertebral arteries patent to the vertebrobasilar junction without stenosis. Left vertebral artery dominant. Posterior inferior cerebral arteries patent bilaterally. Basilar patent to its distal aspect without stenosis. Superior cerebral arteries patent bilaterally. Both of  the posterior cerebral arteries primarily supplied via the basilar and are well perfused their distal aspects. Venous sinuses: Patent.  Anatomic variants: None significant. Review of the MIP images confirms the above findings IMPRESSION: CT HEAD IMPRESSION: 1. Normal expected interval evolution of subacute right MCA territory infarcts, stable in size and distribution as compared to previous exams. 2. 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 No evidence for interval re-bleeding or significant mass effect. 3. Focal area of confluent hypodensity involving the high parasagittal right frontal lobe, stable from previous. Finding is indeterminate, and could reflect sequelae of chronic ischemia or other insult. 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 CTA HEAD AND NECK IMPRESSION: 1. 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 2. 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 Finding could serve as an embolic source. 3. 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 4. Mild to moderate scattered atheromatous change elsewhere about the major arterial vasculature of the head and neck. No other hemodynamically significant or correctable stenosis identified. Electronically Signed   By: Jeannine Boga M.D.   On: 03/06/2019 03:59   Ct Chest W Contrast  Result Date: 03/01/2019 CLINICAL DATA:  Patient status post fall out of bed at 6 a.m. this morning. Bilateral lower extremity weakness. EXAM: CT CHEST, ABDOMEN, AND PELVIS WITH CONTRAST TECHNIQUE: Multidetector CT imaging of the chest, abdomen and pelvis was performed  following the standard protocol during bolus administration of intravenous contrast. CONTRAST:  130mL OMNIPAQUE IOHEXOL 300 MG/ML  SOLN COMPARISON:  CT chest 11/03/2013.  PET CT scan 11/22/2013. FINDINGS: CT CHEST FINDINGS Cardiovascular: Pulmonary embolus are seen in right pulmonary arteries such as on image 27 of series 6 and 64-67 of series 6. Emboli are also seen in the aortic arch such as on images 21 and 22 of series 3 and 69 to 76 of series 7. Heart size is normal. No pleural or pericardial effusion. Calcific aortic and coronary atherosclerosis. Mediastinum/Nodes: No enlarged mediastinal, hilar, or axillary lymph nodes. Thyroid gland, trachea, and esophagus demonstrate no significant findings. Lungs/Pleura: Lungs are emphysematous. There is scar in the left upper lobe at the site of patient's prior lung cancer. Mild dependent atelectasis is noted. Musculoskeletal: No acute or focal bony abnormality. CT ABDOMEN PELVIS FINDINGS Hepatobiliary: No focal liver abnormality is seen. No gallstones, gallbladder wall thickening, or biliary dilatation. The liver is low attenuating consistent with fatty infiltration. Pancreas: 0.4 cm hypoattenuating lesion at the junction of the tail and body on image 65 is likely due to some prior inflammatory process. No peripancreatic inflammatory change. No pancreatic duct dilatation. Spleen: Wedge-shaped hypoattenuation in the superior aspect of the spleen measuring 2.7 cm AP x 2.7 cm transverse by 2.0 cm craniocaudal is consistent with infarct. Spleen is otherwise unremarkable. Adrenals/Urinary Tract: The adrenal glands appear normal. Wedge-shaped area peripheral hypoattenuation in the mid to upper pole of the right kidney is compatible with an infarct. More subtle area of decreased cortical attenuation in the more superior aspect of the right kidney is also worrisome for infarct. Left kidney appears normal. Ureters and urinary bladder are unremarkable. Stomach/Bowel: Stomach is  within normal limits. Appendix appears normal. No evidence of bowel wall thickening, distention, or inflammatory changes. Vascular/Lymphatic: Aortic atherosclerosis. No enlarged abdominal or pelvic lymph nodes. Reproductive: Uterus and bilateral adnexa are  unremarkable. Other: None. Musculoskeletal: No acute or focal abnormality. Degenerative disc disease L5-S1 noted. 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 Findings raise the possibility for ventricular or atrial septal defect. No evidence of trauma. Fatty infiltration of the liver. Atherosclerosis. Emphysema. These results were called by telephone at the time of interpretation on 03/01/2019 at 7:29 pm to [facility] [place], who verbally acknowledged these results. Electronically Signed   By: Inge Rise M.D.   On: 03/01/2019 19:36   Mr Jeri Cos BZ Contrast  Result Date: 03/07/2019 CLINICAL DATA:  73 year old female with right hemisphere/right MCA territory infarct detected by head CT since 03/02/2019. Right MCA M3 occlusion on CTA yesterday. History of HIV and lung cancer. EXAM: MRI HEAD WITHOUT AND WITH CONTRAST TECHNIQUE: Multiplanar, multiecho pulse sequences of the brain and surrounding structures were obtained without and with intravenous contrast. CONTRAST:  10 milliliters Gadavist COMPARISON:  CTA head and neck 03/06/2019. Head CTs 03/03/2019 and earlier. Brain MRI 11/22/2013. FINDINGS: Study is intermittently degraded by motion artifact despite repeated imaging attempts. Brain: Confluent 3 centimeter area of restricted diffusion at the junction of the posterior right temporal lobe, anterior right occipital lobe and inferior right parietal lobe corresponding to the dominant area of hypodensity by CT recently. Associated T2 and FLAIR hyperintensity compatible with cytotoxic edema. Superimposed patchy restricted diffusion in the right periatrial white matter, and small oval petechial  hemorrhage in the posterior right lentiform with surrounding restricted diffusion (stable and 10 millimeters on series 13, image 13 today). Restricted diffusion in the nearby right corona radiata and posterior right insula. Multiple small cortical and subcortical white matter foci of restricted diffusion elsewhere in the posterior right MCA territory (series 5 images 80 through 85), and also in the right frontal operculum. No restricted diffusion in the contralateral left hemisphere. Although there is a questionable punctate area of acute to subacute diffusion restriction in the right cerebellum on series 5, image 55. No other blood products identified. Abnormal T2 and FLAIR hyperintensity in the medial superior right frontal gyrus new since 2015 and seen by CT is facilitated on diffusion, but is associated with nodular heterogeneous enhancement which appears to be partially extra-axial (series 9, image 11 and series 16, image 25), although only 1 postcontrast sequence could be obtained. This extra-axial component of the lesion is dark on T2 imaging, and the underlying gyrus is mildly expanded. This is new since 2015. Elsewhere there is post ischemic enhancement with some intrinsic T1 hyperintense blood products in the right lentiform. There is mild post ischemic enhancement at the most confluent area of restricted diffusion. There is also punctate enhancement suggested at the right frontal operculum on series 16, image 19 which is probably corresponding to a small cortical infarct on series 5, image 83. No other abnormal intracranial enhancement is identified. No superimposed midline shift, ventriculomegaly. The left deep gray nuclei and brainstem appear to remain normal. Mild scattered chronic cerebral white matter T2 and FLAIR hyperintensity elsewhere is stable since 2015. Cervicomedullary junction and pituitary are within normal limits. Vascular: Major intracranial vascular flow voids are stable since 2015.  Skull and upper cervical spine: Nonspecific decreased T1 signal in the upper cervical vertebrae since 2015. No suspicious skull bone marrow signal changes, including overlying the right superior frontal gyrus lesion. Sinuses/Orbits: Chronic left orbital floor fracture, stable. Other: Mastoids are clear. IMPRESSION: 1. Nodular and enhancing right superior frontal gyrus and/or extra-axial mass measuring about 15 mm with  mild regional vasogenic edema is new since the 2015 MRI. Only 1 motion degraded post-contrast sequence could be obtained today. Given this patient's medical history differential considerations in lung cancer metastasis, HIV related infection, CNS lymphoma, or less likely meningioma. A repeat Brain MRI without and with contrast when the patient can better cooperate might refine the differential diagnosis. 2. Scattered acute infarcts in the right hemisphere as on CT, mostly in the right MCA territory. Stable associated petechial hemorrhage in the posterior right lentiform. Possible punctate infarct in the right cerebellum, but no left-side vascular territory involvement. 3. No significant intracranial mass effect. Electronically Signed   By: Genevie Ann M.D.   On: 03/07/2019 06:24   Ct Abdomen Pelvis W Contrast  Result Date: 03/01/2019 CLINICAL DATA:  Patient status post fall out of bed at 6 a.m. this morning. Bilateral lower extremity weakness. EXAM: CT CHEST, ABDOMEN, AND PELVIS WITH CONTRAST TECHNIQUE: Multidetector CT imaging of the chest, abdomen and pelvis was performed following the standard protocol during bolus administration of intravenous contrast. CONTRAST:  160mL OMNIPAQUE IOHEXOL 300 MG/ML  SOLN COMPARISON:  CT chest 11/03/2013.  PET CT scan 11/22/2013. FINDINGS: CT CHEST FINDINGS Cardiovascular: Pulmonary embolus are seen in right pulmonary arteries such as on image 39 of series 6 and 64-67 of series 6. Emboli are also seen in the aortic arch such as on images 21 and 22 of series 3 and 69  to 76 of series 7. Heart size is normal. No pleural or pericardial effusion. Calcific aortic and coronary atherosclerosis. Mediastinum/Nodes: No enlarged mediastinal, hilar, or axillary lymph nodes. Thyroid gland, trachea, and esophagus demonstrate no significant findings. Lungs/Pleura: Lungs are emphysematous. There is scar in the left upper lobe at the site of patient's prior lung cancer. Mild dependent atelectasis is noted. Musculoskeletal: No acute or focal bony abnormality. CT ABDOMEN PELVIS FINDINGS Hepatobiliary: No focal liver abnormality is seen. No gallstones, gallbladder wall thickening, or biliary dilatation. The liver is low attenuating consistent with fatty infiltration. Pancreas: 0.4 cm hypoattenuating lesion at the junction of the tail and body on image 65 is likely due to some prior inflammatory process. No peripancreatic inflammatory change. No pancreatic duct dilatation. Spleen: Wedge-shaped hypoattenuation in the superior aspect of the spleen measuring 2.7 cm AP x 2.7 cm transverse by 2.0 cm craniocaudal is consistent with infarct. Spleen is otherwise unremarkable. Adrenals/Urinary Tract: The adrenal glands appear normal. Wedge-shaped area peripheral hypoattenuation in the mid to upper pole of the right kidney is compatible with an infarct. More subtle area of decreased cortical attenuation in the more superior aspect of the right kidney is also worrisome for infarct. Left kidney appears normal. Ureters and urinary bladder are unremarkable. Stomach/Bowel: Stomach is within normal limits. Appendix appears normal. No evidence of bowel wall thickening, distention, or inflammatory changes. Vascular/Lymphatic: Aortic atherosclerosis. No enlarged abdominal or pelvic lymph nodes. Reproductive: Uterus and bilateral adnexa are unremarkable. Other: None. Musculoskeletal: No acute or focal abnormality. Degenerative disc disease L5-S1 noted. IMPRESSION: Pulmonary emboli and emboli in the aortic arch with  right renal infarcts and a splenic infarct. Findings raise the possibility for ventricular or atrial septal defect. No evidence of trauma. Fatty infiltration of the liver. Atherosclerosis. Emphysema. These results were called by telephone at the time of interpretation on 03/01/2019 at 7:29 pm to [facility] [place], who verbally acknowledged these results. Electronically Signed   By: Inge Rise M.D.   On: 03/01/2019 19:36   Dg Chest Port 1 View  Result Date: 03/03/2019 CLINICAL DATA:  Respiratory failure EXAM: PORTABLE CHEST 1 VIEW COMPARISON:  03/01/2019 FINDINGS: Cardiac shadow is mildly enlarged but stable. Aortic calcifications are again seen. The lungs are well aerated bilaterally. Increased density in the lateral aspect of the right lung base is noted likely related to the patient's known pulmonary emboli. Irregularity of the fifth and sixth ribs on the left laterally is seen. These were not present on prior chest x-rays from 2016 and likely related to the patient's known history of prior lung carcinoma with mild healing. No acute fracture is seen. IMPRESSION: Slight increase in parenchymal opacity in the right lung base laterally likely related to the known underlying pulmonary emboli. Old deformities of the fifth and sixth ribs on the left likely related to the patient's known history of prior lung carcinoma. Electronically Signed   By: Inez Catalina M.D.   On: 03/03/2019 06:10   Dg Chest Port 1 View  Result Date: 02/14/2019 CLINICAL DATA:  Shortness of breath EXAM: PORTABLE CHEST 1 VIEW COMPARISON:  02/13/2019 FINDINGS: Cardiac shadow is stable. The lungs are well aerated bilaterally. No focal infiltrate or sizable effusion is noted. No bony abnormality is seen. IMPRESSION: No acute abnormality noted. Electronically Signed   By: Inez Catalina M.D.   On: 02/14/2019 07:51   Dg Chest Port 1 View  Result Date: 02/13/2019 CLINICAL DATA:  Shortness of breath. Respiratory failure and hypoxia. EXAM:  PORTABLE CHEST 1 VIEW COMPARISON:  02/12/2019 FINDINGS: Prominent main pulmonary artery. Mild enlargement of the cardiopericardial silhouette indistinct opacity along the lingula. Left midlung scarring. Peripheral left basilar bandlike density. The right lung appears clear. Chronic deformity of the left lateral fifth and sixth ribs. IMPRESSION: 1. New indistinct lingular opacity peripherally. This could be from a pleural process or airspace opacity. 2. Continued peripheral scarring in the left mid lung with adjacent chronic rib deformities. 3. Mild enlargement of the cardiopericardial silhouette, without edema. 4. Prominent main pulmonary artery suggesting pulmonary arterial hypertension. Electronically Signed   By: Van Clines M.D.   On: 02/13/2019 08:03   Dg Chest Port 1 View  Result Date: 02/12/2019 CLINICAL DATA:  Acute chronic respiratory failure. EXAM: PORTABLE CHEST 1 VIEW COMPARISON:  02/11/2019 FINDINGS: Heart size is normal. No pleural effusion or edema. Stable chronic scarring within the left midlung. No superimposed airspace consolidation. IMPRESSION: 1. No active disease.  Unchanged left midlung scarring. Electronically Signed   By: Kerby Moors M.D.   On: 02/12/2019 08:28   Dg Chest Port 1 View  Result Date: 02/11/2019 CLINICAL DATA:  Shortness of breath. EXAM: PORTABLE CHEST 1 VIEW COMPARISON:  Chest x-rays dated 03/29/2015 and 01/11/2012. FINDINGS: Borderline cardiomegaly is stable. Fullness of the LEFT aortopulmonary window region, most likely a prominent main pulmonary artery suggesting chronic pulmonary artery hypertension. Increased interstitial prominence bilaterally, bibasilar predominant, LEFT slightly greater than RIGHT, presumably mild edema superimposed on chronic interstitial lung disease. No confluent opacity to suggest a consolidating pneumonia. No pleural effusion or pneumothorax seen. Osseous structures about the chest are unremarkable. IMPRESSION: 1. Probable mild  bilateral interstitial edema superimposed on chronic interstitial lung disease. 2. No evidence of consolidating pneumonia or alveolar pulmonary edema. 3. Probable chronic pulmonary artery hypertension. 4. Stable borderline cardiomegaly. Electronically Signed   By: Franki Cabot M.D.   On: 02/11/2019 18:12   Dg Hips Bilat W Or Wo Pelvis 3-4 Views  Result Date: 03/01/2019 CLINICAL DATA:  Bilateral hip pain after fall today. EXAM: DG HIP (WITH OR WITHOUT PELVIS) 3-4V BILAT COMPARISON:  None. FINDINGS:  There is no evidence of hip fracture or dislocation. There is no evidence of arthropathy or other focal bone abnormality. IMPRESSION: Negative. Electronically Signed   By: Marijo Conception M.D.   On: 03/01/2019 14:45   Vas US Carotid (at [facility] Only)  Result Date: 03/02/2019 Carotid Arterial Duplex Study Indications:       CVA. Risk Factors:      None. Limitations        Today's exam was limited due to the body habitus of the                    patient, patient movement, patient positioning and the                    patient's respiratory variation. Comparison Study:  No prior studies. Performing Technologist: Oliver Hum RVT  Examination Guidelines: A complete evaluation includes B-mode imaging, spectral Doppler, color Doppler, and power Doppler as needed of all accessible portions of each vessel. Bilateral testing is considered an integral part of a complete examination. Limited examinations for reoccurring indications may be performed as noted.  Right Carotid Findings: +----------+--------+--------+--------+-----------------------+--------+           PSV cm/sEDV cm/sStenosisPlaque Description     Comments +----------+--------+--------+--------+-----------------------+--------+ CCA Prox  68      13              smooth and heterogenous         +----------+--------+--------+--------+-----------------------+--------+ CCA Distal65      11              smooth and heterogenous          +----------+--------+--------+--------+-----------------------+--------+ ICA Prox  58      14              smooth and heterogenoustortuous +----------+--------+--------+--------+-----------------------+--------+ ICA Distal57      22                                     tortuous +----------+--------+--------+--------+-----------------------+--------+ ECA       60      11                                              +----------+--------+--------+--------+-----------------------+--------+ +----------+--------+-------+--------+-------------------+           PSV cm/sEDV cmsDescribeArm Pressure (mmHG) +----------+--------+-------+--------+-------------------+ [id number]                                        +----------+--------+-------+--------+-------------------+ +---------+--------+--+--------+--+---------+ VertebralPSV cm/s73EDV cm/s24Antegrade +---------+--------+--+--------+--+---------+  Left Carotid Findings: +----------+--------+--------+--------+-----------------------+--------+           PSV cm/sEDV cm/sStenosisPlaque Description     Comments +----------+--------+--------+--------+-----------------------+--------+ CCA Prox  101     19              smooth and heterogenous         +----------+--------+--------+--------+-----------------------+--------+ CCA Distal79      16              smooth and heterogenous         +----------+--------+--------+--------+-----------------------+--------+ ICA Prox  80      25  smooth and heterogenoustortuous +----------+--------+--------+--------+-----------------------+--------+ ICA Distal60      29                                     tortuous +----------+--------+--------+--------+-----------------------+--------+ ECA       66                                                      +----------+--------+--------+--------+-----------------------+--------+  +----------+--------+--------+--------+-------------------+           PSV cm/sEDV cm/sDescribeArm Pressure (mmHG) +----------+--------+--------+--------+-------------------+ [id number]                                         +----------+--------+--------+--------+-------------------+ +---------+--------+--+--------+--+---------+ VertebralPSV cm/s72EDV cm/s27Antegrade +---------+--------+--+--------+--+---------+  Summary: Right Carotid: Velocities in the right ICA are consistent with a 1-39% stenosis. Left Carotid: Velocities in the left ICA are consistent with a 1-39% stenosis. Vertebrals: Bilateral vertebral arteries demonstrate antegrade flow. *See table(s) above for measurements and observations.  Electronically signed by Antony Contras MD on 03/02/2019 at 1:16:38 PM.    Final    Vas [name] Lower Extremity Venous (dvt)  Result Date: 03/02/2019  Lower Venous Study Indications: Pulmonary embolism, and stroke.  Risk Factors: None identified. Limitations: Body habitus, poor ultrasound/tissue interface and patient movement, patient positioning. Comparison Study: No prior studies. Performing Technologist: Oliver Hum RVT  Examination Guidelines: A complete evaluation includes B-mode imaging, spectral Doppler, color Doppler, and power Doppler as needed of all accessible portions of each vessel. Bilateral testing is considered an integral part of a complete examination. Limited examinations for reoccurring indications may be performed as noted.  +---------+---------------+---------+-----------+----------+--------------+ RIGHT    CompressibilityPhasicitySpontaneityPropertiesThrombus Aging +---------+---------------+---------+-----------+----------+--------------+ CFV      Full           Yes      Yes                                 +---------+---------------+---------+-----------+----------+--------------+ SFJ      Full                                                         +---------+---------------+---------+-----------+----------+--------------+ FV Prox  Full                                                        +---------+---------------+---------+-----------+----------+--------------+ FV Mid   Full                                                        +---------+---------------+---------+-----------+----------+--------------+ FV DistalFull                                                        +---------+---------------+---------+-----------+----------+--------------+  PFV      Full                                                        +---------+---------------+---------+-----------+----------+--------------+ POP      Full           Yes      Yes                                 +---------+---------------+---------+-----------+----------+--------------+ PTV      Full                                                        +---------+---------------+---------+-----------+----------+--------------+ PERO     Full                                                        +---------+---------------+---------+-----------+----------+--------------+   +---------+---------------+---------+-----------+----------+--------------+ LEFT     CompressibilityPhasicitySpontaneityPropertiesThrombus Aging +---------+---------------+---------+-----------+----------+--------------+ CFV      Full           Yes      Yes                                 +---------+---------------+---------+-----------+----------+--------------+ SFJ      Full                                                        +---------+---------------+---------+-----------+----------+--------------+ FV Prox  Full                                                        +---------+---------------+---------+-----------+----------+--------------+ FV Mid   Full                                                         +---------+---------------+---------+-----------+----------+--------------+ FV DistalFull                                                        +---------+---------------+---------+-----------+----------+--------------+ PFV      Full                                                        +---------+---------------+---------+-----------+----------+--------------+  POP      Full           Yes      Yes                                 +---------+---------------+---------+-----------+----------+--------------+ PTV      Full                                                        +---------+---------------+---------+-----------+----------+--------------+ PERO     Full                                                        +---------+---------------+---------+-----------+----------+--------------+     Summary: Right: There is no evidence of deep vein thrombosis in the lower extremity. No cystic structure found in the popliteal fossa. Left: There is no evidence of deep vein thrombosis in the lower extremity. No cystic structure found in the popliteal fossa.  *See table(s) above for measurements and observations. Electronically signed by Servando Snare MD on 03/02/2019 at 3:40:30 PM.    Final    [name] Ekg Site Rite  Result Date: 03/03/2019 If Site Rite image not attached, placement could not be confirmed due to current cardiac rhythm. TEE ECHOCARDIOGRAM.    Subjective: No new complaints, looking forward to going to rehab.   Discharge Exam: Vitals:   03/10/19 0300 03/10/19 0828  BP: 114/69 111/65  Pulse: 87 80  Resp: 20 17  Temp: 98.5 F (36.9 C) 98.8 F (37.1 C)  SpO2: 97% 100%   Vitals:   03/10/19 0028 03/10/19 0050 03/10/19 0300 03/10/19 0828  BP: 133/81  114/69 111/65  Pulse: 92  87 80  Resp: 16 18 20 17   Temp: 98.2 F (36.8 C)  98.5 F (36.9 C) 98.8 F (37.1 C)  TempSrc: Oral  Oral Oral  SpO2: 95%  97% 100%  Weight:      Height:        General: Pt is alert,  awake, not in acute distress on 4 lit of [place] Oxygen.  Cardiovascular: RRR, S1/S2 +, no rubs, no gallops Respiratory: CTA bilaterally, no wheezing,  Abdominal: Soft, NT, ND, bowel sounds + Extremities: no edema, no cyanosis    The results of significant diagnostics from this hospitalization (including imaging, microbiology, ancillary and laboratory) are listed below for reference.     Microbiology: Recent Results (from the past 240 hour(s))  Urine culture     Status: Abnormal   Collection Time: 03/01/19  5:10 PM   Specimen: Urine, Random  Result Value Ref Range Status   Specimen Description URINE, RANDOM  Final   Special Requests NONE  Final   Culture (A)  Final    >=100,000 COLONIES/mL AEROCOCCUS SPECIES Standardized susceptibility testing for this organism is not available. Performed at [facility], [place] [address] [place], [place] [address]    Report Status 03/03/2019 FINAL  Final  SARS CORONAVIRUS 2 (TAT 6-24 HRS) Nasopharyngeal Nasopharyngeal Swab     Status: None   Collection Time: 03/01/19 10:10 PM   Specimen: Nasopharyngeal Swab  Result Value Ref  Range Status   SARS Coronavirus 2 NEGATIVE NEGATIVE Final    Comment: (NOTE) SARS-CoV-2 target nucleic acids are NOT DETECTED. The SARS-CoV-2 RNA is generally detectable in upper and lower respiratory specimens during the acute phase of infection. Negative results do not preclude SARS-CoV-2 infection, do not rule out co-infections with other pathogens, and should not be used as the sole basis for treatment or other patient management decisions. Negative results must be combined with clinical observations, patient history, and epidemiological information. The expected result is Negative. Fact Sheet for Patients: SugarRoll.be Fact Sheet for Healthcare Providers: https://www.woods-mathews.com/ This test is not yet approved or cleared by the [place] FDA and  has been  authorized for detection and/or diagnosis of SARS-CoV-2 by FDA under an Emergency Use Authorization (EUA). This EUA will remain  in effect (meaning this test can be used) for the duration of the COVID-19 declaration under Section 56 4(b)(1) of the Act, 21 U.S.C. section 360bbb-3(b)(1), unless the authorization is terminated or revoked sooner. Performed at [facility], [place] [address], [place], [place] [address]   Culture, blood (Routine X 2) w Reflex to ID Panel     Status: None   Collection Time: 03/02/19  2:09 AM   Specimen: BLOOD RIGHT FOREARM  Result Value Ref Range Status   Specimen Description BLOOD RIGHT FOREARM  Final   Special Requests   Final    BOTTLES DRAWN AEROBIC AND ANAEROBIC Blood Culture results may not be optimal due to an inadequate volume of blood received in culture bottles   Culture   Final    NO GROWTH 5 DAYS Performed at [facility], [place] [address] [place], [place] [address]    Report Status 03/07/2019 FINAL  Final  Culture, blood (Routine X 2) w Reflex to ID Panel     Status: None   Collection Time: 03/02/19  5:25 AM   Specimen: BLOOD  Result Value Ref Range Status   Specimen Description BLOOD RIGHT ARM  Final   Special Requests   Final    BOTTLES DRAWN AEROBIC AND ANAEROBIC Blood Culture adequate volume   Culture   Final    NO GROWTH 5 DAYS Performed at [facility], [place] [address] [place], [place] [address]    Report Status 03/07/2019 FINAL  Final  SARS CORONAVIRUS 2 (TAT 6-24 HRS) Nasopharyngeal Nasopharyngeal Swab     Status: None   Collection Time: 03/09/19  1:16 PM   Specimen: Nasopharyngeal Swab  Result Value Ref Range Status   SARS Coronavirus 2 NEGATIVE NEGATIVE Final    Comment: (NOTE) SARS-CoV-2 target nucleic acids are NOT DETECTED. The SARS-CoV-2 RNA is generally detectable in upper and lower respiratory specimens during the acute phase of infection. Negative results do not preclude SARS-CoV-2 infection, do not rule  out co-infections with other pathogens, and should not be used as the sole basis for treatment or other patient management decisions. Negative results must be combined with clinical observations, patient history, and epidemiological information. The expected result is Negative. Fact Sheet for Patients: SugarRoll.be Fact Sheet for Healthcare Providers: https://www.woods-mathews.com/ This test is not yet approved or cleared by the [place] FDA and  has been authorized for detection and/or diagnosis of SARS-CoV-2 by FDA under an Emergency Use Authorization (EUA). This EUA will remain  in effect (meaning this test can be used) for the duration of the COVID-19 declaration under Section 56 4(b)(1) of the Act, 21 U.S.C. section 360bbb-3(b)(1), unless the authorization is terminated  or revoked sooner. Performed at [facility], [place] [address] [place], [place] [address]      Labs: BNP (last 3 results) Recent Labs    02/11/19 1924 02/13/19 0413  BNP 533.2* 419.3*   Basic Metabolic Panel: Recent Labs  Lab 03/03/19 1625 03/04/19 0332 03/07/19 0411 03/09/19 0900  NA 142 140 143 140  K 3.9 4.2 3.2* 3.5  CL 105 104 102 100  CO2 29 25 31 31   GLUCOSE 89 96 107* 242*  BUN 8 8 8 12   CREATININE 0.86 0.85 0.69 0.85  CALCIUM 8.2* 8.2* 9.2 8.6*   Liver Function Tests: No results for input(s): AST, ALT, ALKPHOS, BILITOT, PROT, ALBUMIN in the last 168 hours. No results for input(s): LIPASE, AMYLASE in the last 168 hours. No results for input(s): AMMONIA in the last 168 hours. CBC: Recent Labs  Lab 03/04/19 0332 03/05/19 0523 03/06/19 0412 03/07/19 0411  WBC 6.9 7.4 6.2 8.8  HGB 13.3 13.0 15.9* 14.8  HCT 50.0* 47.1* 57.7* 53.1*  MCV 89.1 85.6 84.9 85.0  PLT 115* 128* 121* 175   Cardiac Enzymes: No results for input(s): CKTOTAL, CKMB, CKMBINDEX, TROPONINI in the last 168 hours. BNP: Invalid input(s): POCBNP CBG: No results  for input(s): GLUCAP in the last 168 hours. D-Dimer No results for input(s): DDIMER in the last 72 hours. Hgb A1c No results for input(s): HGBA1C in the last 72 hours. Lipid Profile No results for input(s): CHOL, HDL, LDLCALC, TRIG, CHOLHDL, LDLDIRECT in the last 72 hours. Thyroid function studies No results for input(s): TSH, T4TOTAL, T3FREE, THYROIDAB in the last 72 hours.  Invalid input(s): FREET3 Anemia work up No results for input(s): VITAMINB12, FOLATE, FERRITIN, TIBC, IRON, RETICCTPCT in the last 72 hours. Urinalysis    Component Value Date/Time   COLORURINE AMBER (A) 03/01/2019 1721   APPEARANCEUR CLOUDY (A) 03/01/2019 1721   LABSPEC 1.023 03/01/2019 1721   PHURINE 5.0 03/01/2019 1721   GLUCOSEU NEGATIVE 03/01/2019 1721   HGBUR NEGATIVE 03/01/2019 1721   BILIRUBINUR NEGATIVE 03/01/2019 1721   KETONESUR 80 (A) 03/01/2019 1721   PROTEINUR 100 (A) 03/01/2019 1721   UROBILINOGEN 0.2 03/28/2015 1227   NITRITE NEGATIVE 03/01/2019 1721   LEUKOCYTESUR NEGATIVE 03/01/2019 1721   Sepsis Labs Invalid input(s): PROCALCITONIN,  WBC,  LACTICIDVEN Microbiology Recent Results (from the past 240 hour(s))  Urine culture     Status: Abnormal   Collection Time: 03/01/19  5:10 PM   Specimen: Urine, Random  Result Value Ref Range Status   Specimen Description URINE, RANDOM  Final   Special Requests NONE  Final   Culture (A)  Final    >=100,000 COLONIES/mL AEROCOCCUS SPECIES Standardized susceptibility testing for this organism is not available. Performed at [facility], [place] [address] [place], [place] [address]    Report Status 03/03/2019 FINAL  Final  SARS CORONAVIRUS 2 (TAT 6-24 HRS) Nasopharyngeal Nasopharyngeal Swab     Status: None   Collection Time: 03/01/19 10:10 PM   Specimen: Nasopharyngeal Swab  Result Value Ref Range Status   SARS Coronavirus 2 NEGATIVE NEGATIVE Final    Comment: (NOTE) SARS-CoV-2 target nucleic acids are NOT DETECTED. The SARS-CoV-2 RNA is  generally detectable in upper and lower respiratory specimens during the acute phase of infection. Negative results do not preclude SARS-CoV-2 infection, do not rule out co-infections with other pathogens, and should not be used as the sole basis for treatment or other patient management decisions. Negative results must be combined with clinical observations,  patient history, and epidemiological information. The expected result is Negative. Fact Sheet for Patients: SugarRoll.be Fact Sheet for Healthcare Providers: https://www.woods-mathews.com/ This test is not yet approved or cleared by the [place] FDA and  has been authorized for detection and/or diagnosis of SARS-CoV-2 by FDA under an Emergency Use Authorization (EUA). This EUA will remain  in effect (meaning this test can be used) for the duration of the COVID-19 declaration under Section 56 4(b)(1) of the Act, 21 U.S.C. section 360bbb-3(b)(1), unless the authorization is terminated or revoked sooner. Performed at [facility], [place] [address], [place], [place] [address]   Culture, blood (Routine X 2) w Reflex to ID Panel     Status: None   Collection Time: 03/02/19  2:09 AM   Specimen: BLOOD RIGHT FOREARM  Result Value Ref Range Status   Specimen Description BLOOD RIGHT FOREARM  Final   Special Requests   Final    BOTTLES DRAWN AEROBIC AND ANAEROBIC Blood Culture results may not be optimal due to an inadequate volume of blood received in culture bottles   Culture   Final    NO GROWTH 5 DAYS Performed at [facility], [place] [address] [place], [place] [address]    Report Status 03/07/2019 FINAL  Final  Culture, blood (Routine X 2) w Reflex to ID Panel     Status: None   Collection Time: 03/02/19  5:25 AM   Specimen: BLOOD  Result Value Ref Range Status   Specimen Description BLOOD RIGHT ARM  Final   Special Requests   Final    BOTTLES DRAWN AEROBIC AND ANAEROBIC  Blood Culture adequate volume   Culture   Final    NO GROWTH 5 DAYS Performed at [facility], [place] [address], [place], [place] [address]    Report Status 03/07/2019 FINAL  Final  SARS CORONAVIRUS 2 (TAT 6-24 HRS) Nasopharyngeal Nasopharyngeal Swab     Status: None   Collection Time: 03/09/19  1:16 PM   Specimen: Nasopharyngeal Swab  Result Value Ref Range Status   SARS Coronavirus 2 NEGATIVE NEGATIVE Final    Comment: (NOTE) SARS-CoV-2 target nucleic acids are NOT DETECTED. The SARS-CoV-2 RNA is generally detectable in upper and lower respiratory specimens during the acute phase of infection. Negative results do not preclude SARS-CoV-2 infection, do not rule out co-infections with other pathogens, and should not be used as the sole basis for treatment or other patient management decisions. Negative results must be combined with clinical observations, patient history, and epidemiological information. The expected result is Negative. Fact Sheet for Patients: SugarRoll.be Fact Sheet for Healthcare Providers: https://www.woods-mathews.com/ This test is not yet approved or cleared by the [place] FDA and  has been authorized for detection and/or diagnosis of SARS-CoV-2 by FDA under an Emergency Use Authorization (EUA). This EUA will remain  in effect (meaning this test can be used) for the duration of the COVID-19 declaration under Section 56 4(b)(1) of the Act, 21 U.S.C. section 360bbb-3(b)(1), unless the authorization is terminated or revoked sooner. Performed at [facility], [place] [address], [place], [place] [address]      Time coordinating discharge: 32 minutes  SIGNED:   Hosie Poisson, MD  [facility] 03/10/2019, 11:17 AM Pager   If 7PM-7AM, please contact night-coverage [email or web address] Password [email or web address]

## 2019-03-10 NOTE — TOC Transition Note (Signed)
Transition of Care [facility]) - CM/SW Discharge Note   Patient Details  Name: JOSELYNN AMOROSO MRN: [id number] Date of Birth: 04-02-1946  Transition of Care [facility] CM/SW Contact:  Geralynn Ochs, LCSW Phone Number: 03/10/2019, 1:20 PM   Clinical Narrative:   Nurse to call report to [phone], Room 905B    Final next level of care: Skilled Nursing Facility Barriers to Discharge: Barriers Resolved   Patient Goals and CMS Choice Patient states their goals for this hospitalization and ongoing recovery are:: Pt family is agreeable to rehab CMS Medicare.gov Compare Post Acute Care list provided to:: Patient Represenative (must comment) Choice offered to / list presented to : Sibling  Discharge Placement              Patient chooses bed at: [facility] Patient to be transferred to facility by: [place] Name of family member notified: Daisy Patient and family notified of of transfer: 03/10/19  Discharge Plan and Services In-house Referral: Clinical Social Work Discharge Planning Services: NA Post Acute Care Choice: [place]          DME Arranged: N/A DME Agency: NA       HH Arranged: NA HH Agency: NA        Social Determinants of Health (SDOH) Interventions     Readmission Risk Interventions No flowsheet data found.

## 2019-03-10 NOTE — Progress Notes (Signed)
Physical Therapy Treatment Patient Details Name: [name] MRN: [id number] DOB: 1946-04-10 Today's Date: 03/10/2019    History of Present Illness 73yo F dmitted 9/2 after a fall with generalized weakness and CP.  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 As of 9/4, pt with AMS CT H is evolving.PMH HIV, adenocarcinoma of LUL s/p radiation therapy, HTN, HLD, COPD, chronic respiratory failure on 4 L/min oxygen at baseline, asthma, HBV, tobacco abuse, CKD III.    PT Comments    Patient seen for mobility progression. Pt is making gradual progress toward PT goals. Pt ambulated 40 ft with min/mod A and RW with +2 for chair follow. Continue to progress as tolerated with anticipated d/c to SNF for further skilled PT services.     Follow Up Recommendations  SNF;Supervision/Assistance - 24 hour     Equipment Recommendations  None recommended by PT    Recommendations for Other Services       Precautions / Restrictions Precautions Precautions: Fall Precaution Comments: watch O2; uses O2 at baseline Restrictions Weight Bearing Restrictions: No    Mobility  Bed Mobility               General bed mobility comments: pt OOB in chair upon arrival  Transfers Overall transfer level: Needs assistance Equipment used: Rolling walker (2 wheeled) Transfers: Sit to/from Stand Sit to Stand: Min assist         General transfer comment: assist to power up into standing; cues for safe hand placement  Ambulation/Gait Ambulation/Gait assistance: +2 safety/equipment;Min assist;Mod assist Gait Distance (Feet): 40 Feet Assistive device: Rolling walker (2 wheeled) Gait Pattern/deviations: Step-to pattern;Decreased stride length;Decreased step length - left;Decreased dorsiflexion - left;Trunk flexed;Decreased weight shift to left Gait velocity: decreased   General Gait Details: pt required less cues this session  for step length and proximity to RW; cues for upright posture, increased L step length, and safe use of AD; assistance to manage RW and to steady   Stairs             Wheelchair Mobility    Modified Rankin (Stroke Patients Only) Modified Rankin (Stroke Patients Only) Pre-Morbid Rankin Score: Moderate disability Modified Rankin: Moderately severe disability     Balance Overall balance assessment: Needs assistance Sitting-balance support: No upper extremity supported;Feet supported Sitting balance-Leahy Scale: Good     Standing balance support: Single extremity supported;Bilateral upper extremity supported;During functional activity Standing balance-Leahy Scale: Poor                              Cognition Arousal/Alertness: Awake/alert Behavior During Therapy: WFL for tasks assessed/performed Overall Cognitive Status: Impaired/Different from baseline Area of Impairment: Problem solving;Safety/judgement;Orientation;Memory                 Orientation Level: Situation;Disoriented to   Memory: Decreased short-term memory   Safety/Judgement: Decreased awareness of deficits;Decreased awareness of safety   Problem Solving: Requires verbal cues;Difficulty sequencing        Exercises      General Comments        Pertinent Vitals/Pain Pain Assessment: Faces Faces Pain Scale: Hurts a little bit Pain Location: "all over"(feet, eyes, back) Pain Descriptors / Indicators: Discomfort;Sore Pain Intervention(s): Limited activity within patient's tolerance;Monitored during session;Repositioned    Home Living  Prior Function            PT Goals (current goals can now be found in the care plan section) Progress towards PT goals: Progressing toward goals    Frequency    Min 3X/week      PT Plan Current plan remains appropriate    Co-evaluation              AM-PAC PT "6 Clicks" Mobility   Outcome Measure   Help needed turning from your back to your side while in a flat bed without using bedrails?: None Help needed moving from lying on your back to sitting on the side of a flat bed without using bedrails?: A Little Help needed moving to and from a bed to a chair (including a wheelchair)?: A Little Help needed standing up from a chair using your arms (e.g., wheelchair or bedside chair)?: A Little Help needed to walk in hospital room?: A Little Help needed climbing 3-5 steps with a railing? : A Lot 6 Click Score: 18    End of Session Equipment Utilized During Treatment: Oxygen;Gait belt Activity Tolerance: Patient tolerated treatment well Patient left: in chair;with call bell/phone within reach;with chair alarm set Nurse Communication: Mobility status PT Visit Diagnosis: Unsteadiness on feet (R26.81);Other symptoms and signs involving the nervous system (R29.898);Other abnormalities of gait and mobility (R26.89)     Time: 7948-0165 PT Time Calculation (min) (ACUTE ONLY): 28 min  Charges:  $Gait Training: 23-37 mins                     Earney Navy, PTA Acute Rehabilitation Services Pager: [phone] Office: [phone]     Darliss Cheney 03/10/2019, 11:22 AM

## 2019-05-05 ENCOUNTER — Emergency Department (HOSPITAL_COMMUNITY)
Admission: EM | Admit: 2019-05-05 | Discharge: 2019-05-05 | Disposition: A | Discharge status: Home / Self Care | Payer: Medicare Other | Attending: Emergency Medicine | Admitting: Emergency Medicine

## 2019-05-05 ENCOUNTER — Other Ambulatory Visit: Payer: Self-pay

## 2019-05-05 ENCOUNTER — Encounter (HOSPITAL_COMMUNITY): Payer: Self-pay

## 2019-05-05 DIAGNOSIS — M810 Age-related osteoporosis without current pathological fracture: Secondary | ICD-10-CM | POA: Insufficient documentation

## 2019-05-05 DIAGNOSIS — N183 Chronic kidney disease, stage 3 unspecified: Secondary | ICD-10-CM | POA: Insufficient documentation

## 2019-05-05 DIAGNOSIS — G589 Mononeuropathy, unspecified: Secondary | ICD-10-CM | POA: Diagnosis not present

## 2019-05-05 DIAGNOSIS — Z79899 Other long term (current) drug therapy: Secondary | ICD-10-CM | POA: Insufficient documentation

## 2019-05-05 DIAGNOSIS — Z21 Asymptomatic human immunodeficiency virus [HIV] infection status: Secondary | ICD-10-CM | POA: Diagnosis not present

## 2019-05-05 DIAGNOSIS — F17210 Nicotine dependence, cigarettes, uncomplicated: Secondary | ICD-10-CM | POA: Insufficient documentation

## 2019-05-05 DIAGNOSIS — G629 Polyneuropathy, unspecified: Secondary | ICD-10-CM

## 2019-05-05 DIAGNOSIS — J449 Chronic obstructive pulmonary disease, unspecified: Secondary | ICD-10-CM | POA: Diagnosis not present

## 2019-05-05 DIAGNOSIS — M79605 Pain in left leg: Secondary | ICD-10-CM | POA: Diagnosis present

## 2019-05-05 MED ORDER — LIDOCAINE 5 % EX PTCH
1.0000 | MEDICATED_PATCH | CUTANEOUS | Status: DC
  Administered 2019-05-05: 1 via TRANSDERMAL
  Filled 2019-05-05: qty 1

## 2019-05-05 MED ORDER — ACETAMINOPHEN 500 MG PO TABS
1000.0000 mg | ORAL_TABLET | Freq: Once | ORAL | Status: AC
  Administered 2019-05-05: 1000 mg via ORAL

## 2019-05-05 MED ORDER — KETOROLAC TROMETHAMINE 30 MG/ML IJ SOLN: 30.0000 mg | Freq: Once | INTRAMUSCULAR | Status: DC

## 2019-05-05 MED ORDER — KETOROLAC TROMETHAMINE 30 MG/ML IJ SOLN
30.0000 mg | Freq: Once | INTRAMUSCULAR | Status: AC
  Administered 2019-05-05: 06:00:00 30 mg via INTRAMUSCULAR
  Filled 2019-05-05: qty 1

## 2019-05-05 NOTE — ED Provider Notes (Signed)
[facility] DEPT Provider Note   CSN: [id number] Arrival date & time: 05/05/19  0407     History   Chief Complaint Chief Complaint  Patient presents with  . Leg Pain    HPI [name] is a 73 y.o. female.     The history is provided by the patient.  Leg Pain Location:  Foot and toe Foot location:  Dorsum of L foot and dorsum of R foot Toe location:  L little toe, R little toe, L fourth toe, R fourth toe, L third toe, R third toe, L second toe, R second toe, L great toe and R great toe Pain details:    Quality:  Burning   Radiates to: ankles    Severity:  Severe   Onset quality:  Gradual   Duration:  24 months   Timing:  Constant   Progression:  Worsening Chronicity:  Chronic Foreign body present:  No foreign bodies Tetanus status:  Up to date Prior injury to area:  No Relieved by:  Nothing Worsened by:  Nothing Ineffective treatments: home narcotis and neurontin for her known neuropathy. Associated symptoms: no back pain, no decreased ROM, no fatigue, no fever, no itching, no muscle weakness, no neck pain, no numbness, no stiffness, no swelling and no tingling   Risk factors: obesity   Risk factors: no concern for non-accidental trauma   Patient with known neuropathy presents with worsening neuropathic pain.  Took her home opioid pain medication and home neurontin without relief.  She reports she is compliant with her eliquis and has not missed doses.s  There are no wounds.  No coldness of the legs.    Past Medical History:  Diagnosis Date  . Asthma   . Atypical squamous cells of undetermined significance (ASCUS) on Papanicolaou smear of cervix   . COPD (chronic obstructive pulmonary disease) ([place])   . H/O drainage of abscess    left axilla, from left breast  . Hep B w/o coma   . HIV (human immunodeficiency virus infection) ([place])   . Hyperlipidemia   . Lung cancer ([place]) 12/14/13   LUL Adenocarcinoma  . Neuromuscular disorder  ([place])    fingers/feet neuropathy  . Non-small cell lung cancer ([place])   . S/P radiation therapy 01/26/14-02/02/14   sbrt  lt upper lung-54Gy/56fx    Patient Active Problem List   Diagnosis Date Noted  . DNR (do not resuscitate)   . Altered mental status   . Acute pulmonary embolism without acute cor pulmonale (HCC)   . Acute ischemic right MCA stroke ([place])   . Palliative care encounter   . Aortic embolism or thrombosis ([place])   . CVA (cerebral vascular accident) ([place]) 03/02/2019  . COPD (chronic obstructive pulmonary disease) ([place]) 03/01/2019  . PE (pulmonary thromboembolism) ([place]) 03/01/2019  . Tobacco abuse 03/01/2019  . 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 CKD (chronic kidney disease), stage III 03/01/2019  . Generalized weakness 03/01/2019  . Fall at home   . 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 Splenic infarct   . Acute and chronic respiratory failure (acute-on-chronic) ([place]) 02/11/2019  . Sepsis ([place] ) 03/28/2015  . CAP (community acquired pneumonia) 03/28/2015  . UTI (lower urinary tract infection) 03/28/2015  . Respiratory failure ([place]) 03/28/2015  . Non-small cell lung cancer ([place])   . Malignant neoplasm of upper lobe, bronchus or lung 01/03/2014  . Acute hypoxemic respiratory failure ([place]) 10/31/2013  . COPD with acute exacerbation ([place]) 10/30/2013  . COPD exacerbation ([place])  10/30/2013  . Other and unspecified noninfectious gastroenteritis and colitis(558.9) 11/20/2012  . Rectal bleed 11/19/2012  . Obesity 05/05/2012  . Renal insufficiency 05/05/2012  . Hyperglycemia 05/05/2012  . Asthma 12/10/2011  . Cigarette smoker 12/10/2011  . Hyperlipidemia 12/11/2010  . Hypertension 12/11/2010  . ALLERGIC RHINITIS, SEASONAL 07/03/2010  . COPD 07/03/2010  . Human immunodeficiency virus (HIV) disease ([place]) 12/09/2006  . DEPRESSION 12/09/2006  . BREAST MASS, BENIGN 12/09/2006  . OSTEOPOROSIS 12/09/2006    Past Surgical History:  Procedure Laterality Date  . ABCESS DRAINAGE     abcess  under Left arm, abcess from L breast  . BUBBLE STUDY  03/08/2019   Procedure: BUBBLE STUDY;  Surgeon: [name], MD;  Location: [facility];  Service: Cardiovascular;;  . removal of abnormal cells on uterus    . TEE WITHOUT CARDIOVERSION N/A 03/08/2019   Procedure: TRANSESOPHAGEAL ECHOCARDIOGRAM (TEE);  Surgeon: [name], MD;  Location: [facility] ENDOSCOPY;  Service: Cardiovascular;  Laterality: N/A;  . thyroid gland removed       OB History   No obstetric history on file.      Home Medications    Prior to Admission medications   Medication Sig Start Date End Date Taking? Authorizing Provider  albuterol (PROVENTIL HFA;VENTOLIN HFA) 108 (90 BASE) MCG/ACT inhaler Inhale 1 puff into the lungs every 6 (six) hours as needed for shortness of breath.     [provider]  albuterol (PROVENTIL) (2.5 MG/3ML) 0.083% nebulizer solution Take 3 mLs (2.5 mg total) by nebulization every 4 (four) hours as needed for wheezing or shortness of breath. 01/18/19   Jaynee Eagles, PA-C  apixaban (ELIQUIS) 5 MG TABS tablet Take 2 tablets (10 mg total) by mouth 2 (two) times daily for 2 days. 03/10/19 03/12/19  Hosie Poisson, MD  apixaban (ELIQUIS) 5 MG TABS tablet Take 1 tablet (5 mg total) by mouth 2 (two) times daily. 03/13/19   Hosie Poisson, MD  Ascorbic Acid (VITAMIN C) 100 MG tablet Take 100 mg by mouth daily.    [provider]  ATRIPLA 600-200-300 MG tablet TAKE 1 TABLET BY MOUTH EVERY NIGHT AT BEDTIME Patient taking differently: Take 1 tablet by mouth at bedtime.  09/03/15   Truman Hayward, MD  B Complex-Biotin-FA (VITAMIN B50 COMPLEX PO) Take 1 tablet by mouth daily.    [provider]  Calcium Carbonate-Vitamin D (CALCIUM 600+D) 600-400 MG-UNIT per tablet Take 1 tablet by mouth daily.    [provider]  DULoxetine (CYMBALTA) 20 MG capsule Take 1 capsule (20 mg total) by mouth daily. 03/10/19   Hosie Poisson, MD  Fluticasone-Salmeterol (ADVAIR) 500-50 MCG/DOSE AEPB  Inhale 1 puff into the lungs every 12 (twelve) hours.    [provider]  gabapentin (NEURONTIN) 300 MG capsule TAKE 2 CAPSULES BY MOUTH THREE TIMES A DAY Patient taking differently: Take 600 mg by mouth 3 (three) times daily.  01/14/15   Truman Hayward, MD  loratadine (CLARITIN) 10 MG tablet Take 10 mg by mouth 2 (two) times daily.     [provider]  montelukast (SINGULAIR) 10 MG tablet Take 1 tablet (10 mg total) by mouth at bedtime. 12/02/12   Michel Bickers, MD  nicotine (NICODERM CQ - DOSED IN MG/24 HOURS) 21 mg/24hr patch Place 1 patch (21 mg total) onto the skin daily. 03/10/19   Hosie Poisson, MD  OLANZapine (ZYPREXA) 2.5 MG tablet Take 1 tablet (2.5 mg total) by mouth at bedtime. 03/10/19   Hosie Poisson, MD  omega-3 acid ethyl esters (LOVAZA) 1 g capsule Take 1 g by mouth daily.    [provider]  polyethylene glycol (MIRALAX / GLYCOLAX) 17 g packet Take 17 g by mouth daily. 03/10/19   Hosie Poisson, MD  SPIRIVA RESPIMAT 2.5 MCG/ACT AERS INHALE 2 PUFFS INTO THE LUNGS DAILY Patient taking differently: Inhale 2 puffs into the lungs daily.  09/24/14   Rigoberto Noel, MD  VITAMIN A PO Take 1 tablet by mouth daily.    [provider]    Family History Family History  Problem Relation Age of Onset  . Pneumonia Mother   . Hypertension Sister   . Diabetes Sister   . Cancer Sister        pancreatic and lung  . Cancer Brother        pancreatic  . Cancer Brother        pancreatic    Social History Social History   Tobacco Use  . Smoking status: Current Every Day Smoker    Packs/day: 1.00    Years: 40.00    Pack years: 40.00    Types: Cigarettes  . Smokeless tobacco: Never Used  . Tobacco comment: quit date the day she starts her radiation at the [facility]  Substance Use Topics  . Alcohol use: No    Alcohol/week: 0.0 standard drinks    Comment: none in 20 years  . Drug use: No    Comment: none in 20 years     Allergies   Patient  has no known allergies.   Review of Systems Review of Systems  Constitutional: Negative for fatigue and fever.  HENT: Negative for congestion.   Respiratory: Negative for cough and shortness of breath.   Cardiovascular: Negative for chest pain, palpitations and leg swelling.  Gastrointestinal: Negative for abdominal pain.  Genitourinary: Negative for difficulty urinating.  Musculoskeletal: Positive for arthralgias. Negative for back pain, gait problem, neck pain and stiffness.  Skin: Negative for itching, rash and wound.  Neurological: Negative for weakness and numbness.  Psychiatric/Behavioral: Negative for agitation.     Physical Exam Updated Vital Signs BP 131/66 (BP Location: Left Arm)   Pulse 94   Temp 98.2 F (36.8 C) (Oral)   Resp 18   SpO2 91%   Physical Exam Vitals signs and nursing note reviewed.  Constitutional:      General: She is not in acute distress.    Appearance: Normal appearance.  HENT:     Head: Normocephalic and atraumatic.     Nose: Nose normal.  Eyes:     Conjunctiva/sclera: Conjunctivae normal.     Pupils: Pupils are equal, round, and reactive to light.  Neck:     Musculoskeletal: Normal range of motion and neck supple.  Cardiovascular:     Rate and Rhythm: Normal rate.     Pulses: Normal pulses.     Heart sounds: Normal heart sounds.  Pulmonary:     Effort: Pulmonary effort is normal.     Breath sounds: Normal breath sounds.  Abdominal:     General: Abdomen is flat.     Tenderness: There is no abdominal tenderness. There is no guarding.  Musculoskeletal: Normal range of motion.        General: No swelling or tenderness.     Right knee: Normal.     Left knee: Normal.     Right ankle: Normal. Achilles tendon normal.     Left ankle: Normal. Achilles tendon normal.     Right  lower leg: Normal. No edema.     Left lower leg: Normal. No edema.     Right foot: Normal. Normal range of motion and normal capillary refill. No tenderness, bony  tenderness, swelling, crepitus, deformity or laceration.     Left foot: Normal. Normal range of motion and normal capillary refill. No tenderness, bony tenderness, swelling, crepitus, deformity or laceration.  Skin:    General: Skin is warm and dry.     Capillary Refill: Capillary refill takes less than 2 seconds.     Coloration: Skin is not pale.     Findings: No bruising, erythema or rash.     Comments: No wounds normal temperature and color of BLE.  2+ dorsalis pedis B   Neurological:     General: No focal deficit present.     Mental Status: She is alert and oriented to person, place, and time.     Deep Tendon Reflexes: Reflexes normal.  Psychiatric:        Mood and Affect: Mood normal.        Behavior: Behavior normal.      ED Treatments / Results  Labs (all labs ordered are listed, but only abnormal results are displayed) Labs Reviewed - No data to display  EKG None  Radiology No results found.  Procedures Procedures (including critical care time)  Medications Ordered in ED Medications  lidocaine (LIDODERM) 5 % 1 patch (1 patch Transdermal Patch Applied 05/05/19 0447)  ketorolac (TORADOL) 30 MG/ML injection 30 mg (has no administration in time range)  acetaminophen (TYLENOL) tablet 1,000 mg (1,000 mg Oral Given 05/05/19 0447)     Initial Impression / Assessment and Plan / ED Course  Pain is neuropathic in nature.  She is on eliquis.  There are no wounds, no trauma, no signs of ischemia.  Continue home narcotics and neurontin and discuss adjustment of these medications with your pain management specialist or PMD.  No indication for imaging at this time.  [name] was evaluated in Emergency Department on 05/05/2019 for the symptoms described in the history of present illness. She was evaluated in the context of the global COVID-19 pandemic, which necessitated consideration that the patient might be at risk for infection with the SARS-CoV-2 virus that causes  COVID-19. Institutional protocols and algorithms that pertain to the evaluation of patients at risk for COVID-19 are in a state of rapid change based on information released by regulatory bodies including the CDC and federal and state organizations. These policies and algorithms were followed during the patient's care in the ED.   Final Clinical Impressions(s) / ED Diagnoses   Final diagnoses:  Neuropathy   Return for weakness, numbness, changes in vision or speech, fevers >100.4 unrelieved by medication, shortness of breath, intractable vomiting, or diarrhea, abdominal pain, Inability to tolerate liquids or food, cough, altered mental status or any concerns. No signs of systemic illness or infection. The patient is nontoxic-appearing on exam and vital signs are within normal limits.   I have reviewed the triage vital signs and the nursing notes. Pertinent labs &imaging results that were available during my care of the patient were reviewed by me and considered in my medical decision making (see chart for details).  After history, exam, and medical workup I feel the patient has been appropriately medically screened and is safe for discharge home. Pertinent diagnoses were discussed with the patient. Patient was given return precautions    Zamarion Longest, MD 05/05/19 [phone]

## 2019-05-05 NOTE — ED Notes (Signed)
[facility] contacted for transport home. Paperwork printed and at nursing station. Discharge instructions reviewed with patient.

## 2019-05-05 NOTE — ED Triage Notes (Signed)
Per EMS, patient coming from home with complaints of bilateral foot and leg pain, describing the pain as shooting up her feet and legs. Patient has a history of neuropathy and has been taking gabapentin with no improvement. Patient is supposed to take three daily but took 6 with no improvement. Has not tried any other medications for pain.   Patient has asthma and COPD and wears oxygen at home PRN. Her baseline for RA is 86-87%.

## 2019-05-05 NOTE — ED Notes (Signed)
[facility] at bedside to transport patient home.

## 2019-05-08 ENCOUNTER — Other Ambulatory Visit: Payer: Self-pay

## 2019-05-08 ENCOUNTER — Emergency Department (HOSPITAL_COMMUNITY)
Admission: EM | Admit: 2019-05-08 | Discharge: 2019-05-08 | Disposition: A | Discharge status: Home / Self Care | Payer: Medicare Other | Attending: Emergency Medicine | Admitting: Emergency Medicine

## 2019-05-08 ENCOUNTER — Encounter (HOSPITAL_COMMUNITY): Payer: Self-pay

## 2019-05-08 DIAGNOSIS — J45909 Unspecified asthma, uncomplicated: Secondary | ICD-10-CM | POA: Insufficient documentation

## 2019-05-08 DIAGNOSIS — Z7901 Long term (current) use of anticoagulants: Secondary | ICD-10-CM | POA: Diagnosis not present

## 2019-05-08 DIAGNOSIS — Z21 Asymptomatic human immunodeficiency virus [HIV] infection status: Secondary | ICD-10-CM | POA: Diagnosis not present

## 2019-05-08 DIAGNOSIS — F17210 Nicotine dependence, cigarettes, uncomplicated: Secondary | ICD-10-CM | POA: Insufficient documentation

## 2019-05-08 DIAGNOSIS — N183 Chronic kidney disease, stage 3 unspecified: Secondary | ICD-10-CM | POA: Insufficient documentation

## 2019-05-08 DIAGNOSIS — J449 Chronic obstructive pulmonary disease, unspecified: Secondary | ICD-10-CM | POA: Insufficient documentation

## 2019-05-08 DIAGNOSIS — Z79899 Other long term (current) drug therapy: Secondary | ICD-10-CM | POA: Diagnosis not present

## 2019-05-08 DIAGNOSIS — I129 Hypertensive chronic kidney disease with stage 1 through stage 4 chronic kidney disease, or unspecified chronic kidney disease: Secondary | ICD-10-CM | POA: Diagnosis not present

## 2019-05-08 DIAGNOSIS — G629 Polyneuropathy, unspecified: Secondary | ICD-10-CM | POA: Diagnosis not present

## 2019-05-08 LAB — I-STAT CHEM 8, ED
BUN: 16 mg/dL (ref 8–23)
Calcium, Ion: 1.19 mmol/L (ref 1.15–1.40)
Chloride: 102 mmol/L (ref 98–111)
Creatinine, Ser: 1.3 mg/dL — ABNORMAL HIGH (ref 0.44–1.00)
Glucose, Bld: 102 mg/dL — ABNORMAL HIGH (ref 70–99)
HCT: 43 % (ref 36.0–46.0)
Hemoglobin: 14.6 g/dL (ref 12.0–15.0)
Potassium: 3.7 mmol/L (ref 3.5–5.1)
Sodium: 143 mmol/L (ref 135–145)
TCO2: 31 mmol/L (ref 22–32)

## 2019-05-08 MED ORDER — GABAPENTIN 100 MG PO CAPS
100.0000 mg | ORAL_CAPSULE | ORAL | Status: AC
  Administered 2019-05-08: 100 mg via ORAL
  Filled 2019-05-08: qty 1

## 2019-05-08 MED ORDER — KETOROLAC TROMETHAMINE 60 MG/2ML IM SOLN
30.0000 mg | Freq: Once | INTRAMUSCULAR | Status: AC
  Administered 2019-05-08: 30 mg via INTRAMUSCULAR
  Filled 2019-05-08: qty 2

## 2019-05-08 NOTE — ED Notes (Signed)
[facility] contacted for transport. Paperwork printed and at bedside.

## 2019-05-08 NOTE — ED Provider Notes (Signed)
[facility] DEPT Provider Note   CSN: [id number] Arrival date & time: 05/08/19  0258     History   Chief Complaint Chief Complaint  Patient presents with  . Leg Pain    HPI [name] is a 73 y.o. female.     The history is provided by the patient.  Leg Pain Location:  Foot Foot location:  Dorsum of L foot, dorsum of R foot, R foot and L foot Pain details:    Quality:  Burning   Radiates to:  Does not radiate   Severity:  Severe   Onset quality:  Gradual   Timing:  Constant   Progression:  Worsening Chronicity:  Chronic Dislocation: no   Foreign body present:  No foreign bodies Prior injury to area:  No Relieved by:  Nothing Worsened by:  Nothing Ineffective treatments: neurontin and home pain medications. 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  Seen by me for same.  Has not followed up with doctor to discuss ongoing care.    Past Medical History:  Diagnosis Date  . Asthma   . Atypical squamous cells of undetermined significance (ASCUS) on Papanicolaou smear of cervix   . COPD (chronic obstructive pulmonary disease) ([place])   . H/O drainage of abscess    left axilla, from left breast  . Hep B w/o coma   . HIV (human immunodeficiency virus infection) ([place])   . Hyperlipidemia   . Lung cancer ([place]) 12/14/13   LUL Adenocarcinoma  . Neuromuscular disorder ([place])    fingers/feet neuropathy  . Non-small cell lung cancer ([place])   . S/P radiation therapy 01/26/14-02/02/14   sbrt  lt upper lung-54Gy/53fx    Patient Active Problem List   Diagnosis Date Noted  . DNR (do not resuscitate)   . Altered mental status   . Acute pulmonary embolism without acute cor pulmonale (HCC)   . Acute ischemic right MCA stroke ([place])   . Palliative  care encounter   . Aortic embolism or thrombosis ([place])   . CVA (cerebral vascular accident) ([place]) 03/02/2019  . COPD (chronic obstructive pulmonary disease) ([place]) 03/01/2019  . PE (pulmonary thromboembolism) ([place]) 03/01/2019  . Tobacco abuse 03/01/2019  . 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 CKD (chronic kidney disease), stage III 03/01/2019  . Generalized weakness 03/01/2019  . Fall at home   . 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 Splenic infarct   . Acute and chronic respiratory failure (acute-on-chronic) ([place]) 02/11/2019  . Sepsis ([place]) 03/28/2015  . CAP (community acquired pneumonia) 03/28/2015  . UTI (lower urinary tract infection) 03/28/2015  . Respiratory failure ([place]) 03/28/2015  . Non-small cell lung cancer ([place])   . Malignant neoplasm of upper lobe, bronchus or lung 01/03/2014  . Acute hypoxemic respiratory failure ([place]) 10/31/2013  . COPD with acute exacerbation ([place]) 10/30/2013  . COPD exacerbation ([place]) 10/30/2013  . Other and unspecified noninfectious gastroenteritis and colitis(558.9) 11/20/2012  . Rectal bleed 11/19/2012  . Obesity 05/05/2012  . Renal insufficiency 05/05/2012  . Hyperglycemia 05/05/2012  . Asthma 12/10/2011  . Cigarette smoker 12/10/2011  . Hyperlipidemia 12/11/2010  . Hypertension 12/11/2010  . ALLERGIC RHINITIS, SEASONAL 07/03/2010  . COPD 07/03/2010  . Human immunodeficiency virus (HIV) disease ([place]) 12/09/2006  . DEPRESSION 12/09/2006  . BREAST MASS, BENIGN  12/09/2006  . OSTEOPOROSIS 12/09/2006    Past Surgical History:  Procedure Laterality Date  . ABCESS DRAINAGE     abcess under Left arm, abcess from L breast  . BUBBLE STUDY  03/08/2019   Procedure: BUBBLE STUDY;  Surgeon: [name], MD;  Location: [facility];  Service: Cardiovascular;;  . removal of abnormal cells on uterus    . TEE WITHOUT CARDIOVERSION N/A 03/08/2019   Procedure: TRANSESOPHAGEAL ECHOCARDIOGRAM (TEE);  Surgeon: [name], MD;  Location: [facility] ENDOSCOPY;  Service:  Cardiovascular;  Laterality: N/A;  . thyroid gland removed       OB History   No obstetric history on file.      Home Medications    Prior to Admission medications   Medication Sig Start Date End Date Taking? Authorizing Provider  albuterol (PROVENTIL HFA;VENTOLIN HFA) 108 (90 BASE) MCG/ACT inhaler Inhale 1 puff into the lungs every 6 (six) hours as needed for shortness of breath.     [provider]  albuterol (PROVENTIL) (2.5 MG/3ML) 0.083% nebulizer solution Take 3 mLs (2.5 mg total) by nebulization every 4 (four) hours as needed for wheezing or shortness of breath. 01/18/19   Jaynee Eagles, PA-C  apixaban (ELIQUIS) 5 MG TABS tablet Take 2 tablets (10 mg total) by mouth 2 (two) times daily for 2 days. 03/10/19 03/12/19  Hosie Poisson, MD  apixaban (ELIQUIS) 5 MG TABS tablet Take 1 tablet (5 mg total) by mouth 2 (two) times daily. 03/13/19   Hosie Poisson, MD  Ascorbic Acid (VITAMIN C) 100 MG tablet Take 100 mg by mouth daily.    [provider]  ATRIPLA 600-200-300 MG tablet TAKE 1 TABLET BY MOUTH EVERY NIGHT AT BEDTIME Patient taking differently: Take 1 tablet by mouth at bedtime.  09/03/15   Truman Hayward, MD  B Complex-Biotin-FA (VITAMIN B50 COMPLEX PO) Take 1 tablet by mouth daily.    [provider]  Calcium Carbonate-Vitamin D (CALCIUM 600+D) 600-400 MG-UNIT per tablet Take 1 tablet by mouth daily.    [provider]  DULoxetine (CYMBALTA) 20 MG capsule Take 1 capsule (20 mg total) by mouth daily. 03/10/19   Hosie Poisson, MD  Fluticasone-Salmeterol (ADVAIR) 500-50 MCG/DOSE AEPB Inhale 1 puff into the lungs every 12 (twelve) hours.    [provider]  gabapentin (NEURONTIN) 300 MG capsule TAKE 2 CAPSULES BY MOUTH THREE TIMES A DAY Patient taking differently: Take 600 mg by mouth 3 (three) times daily.  01/14/15   Truman Hayward, MD  loratadine (CLARITIN) 10 MG tablet Take 10 mg by mouth 2 (two) times daily.     [provider]  montelukast (SINGULAIR) 10 MG tablet Take 1 tablet (10 mg total) by mouth at bedtime. 12/02/12   Michel Bickers, MD  nicotine (NICODERM CQ - DOSED IN MG/24 HOURS) 21 mg/24hr patch Place 1 patch (21 mg total) onto the skin daily. 03/10/19   Hosie Poisson, MD  OLANZapine (ZYPREXA) 2.5 MG tablet Take 1 tablet (2.5 mg total) by mouth at bedtime. 03/10/19   Hosie Poisson, MD  omega-3 acid ethyl esters (LOVAZA) 1 g capsule Take 1 g by mouth daily.    [provider]  polyethylene glycol (MIRALAX / GLYCOLAX) 17 g packet Take 17 g by mouth daily. 03/10/19   Hosie Poisson, MD  SPIRIVA RESPIMAT 2.5 MCG/ACT AERS INHALE 2 PUFFS INTO THE LUNGS DAILY Patient taking differently: Inhale 2 puffs into the lungs daily.  09/24/14   Rigoberto Noel, MD  VITAMIN A PO Take 1 tablet by mouth daily.    [provider]    Family History Family History  Problem Relation Age of Onset  . Pneumonia Mother   . Hypertension Sister   . Diabetes Sister   . Cancer Sister        pancreatic and lung  . Cancer Brother        pancreatic  . Cancer Brother        pancreatic    Social History Social History   Tobacco Use  . Smoking status: Current Every Day Smoker    Packs/day: 1.00    Years: 40.00    Pack years: 40.00    Types: Cigarettes  . Smokeless tobacco: Never Used  . Tobacco comment: quit date the day she starts her radiation at the [facility]  Substance Use Topics  . Alcohol use: No    Alcohol/week: 0.0 standard drinks    Comment: none in 20 years  . Drug use: No    Comment: none in 20 years     Allergies   Patient has no known allergies.   Review of Systems Review of Systems  Constitutional: Negative for fatigue and fever.  HENT: Negative for congestion.   Eyes: Negative for visual disturbance.  Respiratory: Negative for shortness of breath.   Cardiovascular: Negative for chest pain.  Gastrointestinal: Negative for abdominal pain.  Genitourinary: Negative for difficulty  urinating.  Musculoskeletal: Positive for arthralgias. Negative for back pain, neck pain and stiffness.  Skin: Negative for itching.  Neurological: Negative for weakness and numbness.  Psychiatric/Behavioral: Negative for agitation.  All other systems reviewed and are negative.    Physical Exam Updated Vital Signs BP 139/74 (BP Location: Right Arm)   Pulse 85   Temp 98.1 F (36.7 C) (Oral)   Resp 18   SpO2 100%   Physical Exam Vitals signs and nursing note reviewed.  Constitutional:      General: She is not in acute distress.    Appearance: Normal appearance.  HENT:     Head: Normocephalic and atraumatic.     Nose: Nose normal.  Eyes:     Conjunctiva/sclera: Conjunctivae normal.     Pupils: Pupils are equal, round, and reactive to light.  Neck:     Musculoskeletal: Normal range of motion and neck supple.  Cardiovascular:     Rate and Rhythm: Normal rate.     Pulses: Normal pulses.     Heart sounds: Normal heart sounds.  Pulmonary:     Effort: Pulmonary effort is normal.     Breath sounds: Normal breath sounds.  Abdominal:     General: Abdomen is flat. Bowel sounds are normal.     Tenderness: There is no abdominal tenderness. There is no guarding.  Musculoskeletal: Normal range of motion.        General: No tenderness or signs of injury.  Skin:    General: Skin is warm.     Capillary Refill: Capillary refill takes less than 2 seconds.  Neurological:     General: No focal deficit present.     Mental Status: She is alert and oriented to person, place, and time.     Deep Tendon Reflexes: Reflexes normal.  Psychiatric:        Mood and Affect: Mood normal.        Behavior: Behavior normal.      ED Treatments / Results  Labs (all labs ordered are listed, but only abnormal results are displayed)  Labs Reviewed  I-STAT CHEM 8, ED - Abnormal; Notable for the following components:      Result Value   Creatinine, Ser 1.30 (*)    Glucose, Bld 102 (*)    All other  components within normal limits    EKG None  Radiology No results found.  Procedures Procedures (including critical care time)  Medications Ordered in ED Medications  gabapentin (NEURONTIN) capsule 100 mg (100 mg Oral Given 05/08/19 0331)  ketorolac (TORADOL) injection 30 mg (30 mg Intramuscular Given 05/08/19 0331)     Initial Impression / Assessment and Plan / ED Course  Ongoing neuropathic pain.  Will need to follow up with PMD for ongoing care.    ARVA SLAUGH was evaluated in Emergency Department on 05/08/2019 for the symptoms described in the history of present illness. She was evaluated in the context of the global COVID-19 pandemic, which necessitated consideration that the patient might be at risk for infection with the SARS-CoV-2 virus that causes COVID-19. Institutional protocols and algorithms that pertain to the evaluation of patients at risk for COVID-19 are in a state of rapid change based on information released by regulatory bodies including the CDC and federal and state organizations. These policies and algorithms were followed during the patient's care in the ED. Final Clinical Impressions(s) / ED Diagnoses   Return for weakness, numbness, changes in vision or speech, fevers >100.4 unrelieved by medication, shortness of breath, intractable vomiting, or diarrhea, abdominal pain, Inability to tolerate liquids or food, cough, altered mental status or any concerns. No signs of systemic illness or infection. The patient is nontoxic-appearing on exam and vital signs are within normal limits.   I have reviewed the triage vital signs and the nursing notes. Pertinent labs &imaging results that were available during my care of the patient were reviewed by me and considered in my medical decision making (see chart for details).  After history, exam, and medical workup I feel the patient has been appropriately medically screened and is safe for discharge home. Pertinent  diagnoses were discussed with the patient. Patient was given return precautions   Tekeshia Klahr, MD 05/08/19 3295

## 2019-05-08 NOTE — ED Triage Notes (Signed)
Patient coming from home with complaints of bilateral leg pain. Patient states that she has taken tylenol, gabapentin, and lidocaine patches with no improvement. Patient seen for same earlier this week.

## 2019-05-08 NOTE — ED Notes (Signed)
[facility] at bedside to transport patient home.

## 2019-05-08 NOTE — ED Notes (Addendum)
Patient states that pain has improved.

## 2019-05-14 ENCOUNTER — Other Ambulatory Visit: Payer: Self-pay

## 2019-05-14 ENCOUNTER — Encounter (HOSPITAL_COMMUNITY): Payer: Self-pay | Admitting: Emergency Medicine

## 2019-05-14 ENCOUNTER — Emergency Department (HOSPITAL_COMMUNITY)
Admission: EM | Admit: 2019-05-14 | Discharge: 2019-05-14 | Disposition: A | Discharge status: Home / Self Care | Payer: Medicare Other | Attending: Emergency Medicine | Admitting: Emergency Medicine

## 2019-05-14 DIAGNOSIS — I129 Hypertensive chronic kidney disease with stage 1 through stage 4 chronic kidney disease, or unspecified chronic kidney disease: Secondary | ICD-10-CM | POA: Diagnosis not present

## 2019-05-14 DIAGNOSIS — Z8673 Personal history of transient ischemic attack (TIA), and cerebral infarction without residual deficits: Secondary | ICD-10-CM | POA: Insufficient documentation

## 2019-05-14 DIAGNOSIS — J449 Chronic obstructive pulmonary disease, unspecified: Secondary | ICD-10-CM | POA: Diagnosis not present

## 2019-05-14 DIAGNOSIS — M25571 Pain in right ankle and joints of right foot: Secondary | ICD-10-CM | POA: Diagnosis not present

## 2019-05-14 DIAGNOSIS — E669 Obesity, unspecified: Secondary | ICD-10-CM | POA: Insufficient documentation

## 2019-05-14 DIAGNOSIS — Z79899 Other long term (current) drug therapy: Secondary | ICD-10-CM | POA: Insufficient documentation

## 2019-05-14 DIAGNOSIS — Z6832 Body mass index (BMI) 32.0-32.9, adult: Secondary | ICD-10-CM | POA: Diagnosis not present

## 2019-05-14 DIAGNOSIS — M79604 Pain in right leg: Secondary | ICD-10-CM

## 2019-05-14 DIAGNOSIS — N183 Chronic kidney disease, stage 3 unspecified: Secondary | ICD-10-CM | POA: Diagnosis not present

## 2019-05-14 DIAGNOSIS — Z21 Asymptomatic human immunodeficiency virus [HIV] infection status: Secondary | ICD-10-CM | POA: Diagnosis not present

## 2019-05-14 DIAGNOSIS — M25572 Pain in left ankle and joints of left foot: Secondary | ICD-10-CM | POA: Diagnosis not present

## 2019-05-14 DIAGNOSIS — Z85118 Personal history of other malignant neoplasm of bronchus and lung: Secondary | ICD-10-CM | POA: Diagnosis not present

## 2019-05-14 DIAGNOSIS — F17210 Nicotine dependence, cigarettes, uncomplicated: Secondary | ICD-10-CM | POA: Diagnosis not present

## 2019-05-14 DIAGNOSIS — M79605 Pain in left leg: Secondary | ICD-10-CM

## 2019-05-14 DIAGNOSIS — Z7901 Long term (current) use of anticoagulants: Secondary | ICD-10-CM | POA: Diagnosis not present

## 2019-05-14 MED ORDER — KETOROLAC TROMETHAMINE 30 MG/ML IJ SOLN
30.0000 mg | Freq: Once | INTRAMUSCULAR | Status: AC
  Administered 2019-05-14: 30 mg via INTRAMUSCULAR
  Filled 2019-05-14: qty 1

## 2019-05-14 NOTE — ED Provider Notes (Signed)
[facility] DEPT Provider Note   CSN: [id number] Arrival date & time: 05/14/19  0734     History   Chief Complaint Chief Complaint  Patient presents with  . Leg Pain    HPI MASSA PE is a 73 y.o. female.     HPI   She presents for evaluation of pain in her "ankles."  She states his pain is ongoing and she has not been able to get relief.  It has been present "since I got out of rehab."  She saw a new primary care doctor by telemetry but has not seen a provider in person.  She is in the ED, x2, recently for the same pain.  She denies fever, vomiting, dizziness or focal weakness.  There are no other known modifying factors.  Past Medical History:  Diagnosis Date  . Asthma   . Atypical squamous cells of undetermined significance (ASCUS) on Papanicolaou smear of cervix   . COPD (chronic obstructive pulmonary disease) ([place])   . H/O drainage of abscess    left axilla, from left breast  . Hep B w/o coma   . HIV (human immunodeficiency virus infection) ([place])   . Hyperlipidemia   . Lung cancer ([place]) 12/14/13   LUL Adenocarcinoma  . Neuromuscular disorder ([place])    fingers/feet neuropathy  . Non-small cell lung cancer ([place])   . S/P radiation therapy 01/26/14-02/02/14   sbrt  lt upper lung-54Gy/26fx    Patient Active Problem List   Diagnosis Date Noted  . DNR (do not resuscitate)   . Altered mental status   . Acute pulmonary embolism without acute cor pulmonale (HCC)   . Acute ischemic right MCA stroke ([place])   . Palliative care encounter   . Aortic embolism or thrombosis ([place])   . CVA (cerebral vascular accident) ([place]) 03/02/2019  . COPD (chronic obstructive pulmonary disease) ([place]) 03/01/2019  . PE (pulmonary thromboembolism) ([place]) 03/01/2019  . Tobacco abuse 03/01/2019  . 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 CKD (chronic kidney disease), stage III 03/01/2019  . Generalized weakness 03/01/2019  . Fall at home   . 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  Splenic infarct   . Acute and chronic respiratory failure (acute-on-chronic) ([place]) 02/11/2019  . Lung nodule 10/12/2015  . History of lung cancer 09/08/2015  . Thoracic aorta atherosclerosis ([place]) 09/08/2015  . Sepsis ([place]) 03/28/2015  . CAP (community acquired pneumonia) 03/28/2015  . UTI (lower urinary tract infection) 03/28/2015  . Respiratory failure ([place]) 03/28/2015  . Non-small cell lung cancer ([place])   . Malignant neoplasm of upper lobe, bronchus or lung 01/03/2014  . Acute hypoxemic respiratory failure ([place]) 10/31/2013  . COPD with acute exacerbation ([place]) 10/30/2013  . COPD exacerbation ([place]) 10/30/2013  . Other and unspecified noninfectious gastroenteritis and colitis(558.9) 11/20/2012  . Rectal bleed 11/19/2012  . Obesity 05/05/2012  . Renal insufficiency 05/05/2012  . Hyperglycemia 05/05/2012  . Asthma 12/10/2011  . Cigarette smoker 12/10/2011  . Hyperlipidemia 12/11/2010  . Hypertension 12/11/2010  . ALLERGIC RHINITIS, SEASONAL 07/03/2010  . COPD 07/03/2010  . Human immunodeficiency virus (HIV) disease ([place]) 12/09/2006  . DEPRESSION 12/09/2006  . BREAST MASS, BENIGN 12/09/2006  . OSTEOPOROSIS 12/09/2006    Past Surgical History:  Procedure Laterality Date  . ABCESS DRAINAGE     abcess under Left arm, abcess from L breast  . BUBBLE STUDY  03/08/2019   Procedure: BUBBLE STUDY;  Surgeon: [name], MD;  Location: [facility];  Service: Cardiovascular;;  .  removal of abnormal cells on uterus    . TEE WITHOUT CARDIOVERSION N/A 03/08/2019   Procedure: TRANSESOPHAGEAL ECHOCARDIOGRAM (TEE);  Surgeon: [name], MD;  Location: [facility] ENDOSCOPY;  Service: Cardiovascular;  Laterality: N/A;  . thyroid gland removed       OB History   No obstetric history on file.      Home Medications    Prior to Admission medications   Medication Sig Start Date End Date Taking? Authorizing Provider  albuterol (PROVENTIL HFA;VENTOLIN HFA) 108 (90 BASE) MCG/ACT inhaler Inhale  1 puff into the lungs every 6 (six) hours as needed for shortness of breath.     [provider]  albuterol (PROVENTIL) (2.5 MG/3ML) 0.083% nebulizer solution Take 3 mLs (2.5 mg total) by nebulization every 4 (four) hours as needed for wheezing or shortness of breath. 01/18/19   Jaynee Eagles, PA-C  apixaban (ELIQUIS) 5 MG TABS tablet Take 2 tablets (10 mg total) by mouth 2 (two) times daily for 2 days. 03/10/19 03/12/19  Hosie Poisson, MD  apixaban (ELIQUIS) 5 MG TABS tablet Take 1 tablet (5 mg total) by mouth 2 (two) times daily. 03/13/19   Hosie Poisson, MD  Ascorbic Acid (VITAMIN C) 100 MG tablet Take 100 mg by mouth daily.    [provider]  ATRIPLA 600-200-300 MG tablet TAKE 1 TABLET BY MOUTH EVERY NIGHT AT BEDTIME Patient taking differently: Take 1 tablet by mouth at bedtime.  09/03/15   Truman Hayward, MD  B Complex-Biotin-FA (VITAMIN B50 COMPLEX PO) Take 1 tablet by mouth daily.    [provider]  Calcium Carbonate-Vitamin D (CALCIUM 600+D) 600-400 MG-UNIT per tablet Take 1 tablet by mouth daily.    [provider]  DULoxetine (CYMBALTA) 20 MG capsule Take 1 capsule (20 mg total) by mouth daily. 03/10/19   Hosie Poisson, MD  Fluticasone-Salmeterol (ADVAIR) 500-50 MCG/DOSE AEPB Inhale 1 puff into the lungs every 12 (twelve) hours.    [provider]  gabapentin (NEURONTIN) 300 MG capsule TAKE 2 CAPSULES BY MOUTH THREE TIMES A DAY Patient taking differently: Take 600 mg by mouth 3 (three) times daily.  01/14/15   Truman Hayward, MD  loratadine (CLARITIN) 10 MG tablet Take 10 mg by mouth 2 (two) times daily.     [provider]  montelukast (SINGULAIR) 10 MG tablet Take 1 tablet (10 mg total) by mouth at bedtime. 12/02/12   Michel Bickers, MD  nicotine (NICODERM CQ - DOSED IN MG/24 HOURS) 21 mg/24hr patch Place 1 patch (21 mg total) onto the skin daily. 03/10/19   Hosie Poisson, MD  OLANZapine (ZYPREXA) 2.5 MG tablet Take 1 tablet (2.5 mg  total) by mouth at bedtime. 03/10/19   Hosie Poisson, MD  omega-3 acid ethyl esters (LOVAZA) 1 g capsule Take 1 g by mouth daily.    [provider]  polyethylene glycol (MIRALAX / GLYCOLAX) 17 g packet Take 17 g by mouth daily. 03/10/19   Hosie Poisson, MD  SPIRIVA RESPIMAT 2.5 MCG/ACT AERS INHALE 2 PUFFS INTO THE LUNGS DAILY Patient taking differently: Inhale 2 puffs into the lungs daily.  09/24/14   Rigoberto Noel, MD  VITAMIN A PO Take 1 tablet by mouth daily.    [provider]    Family History Family History  Problem Relation Age of Onset  . Pneumonia Mother   . Hypertension Sister   . Diabetes Sister   . Cancer Sister        pancreatic and  lung  . Cancer Brother        pancreatic  . Cancer Brother        pancreatic    Social History Social History   Tobacco Use  . Smoking status: Current Every Day Smoker    Packs/day: 1.00    Years: 40.00    Pack years: 40.00    Types: Cigarettes  . Smokeless tobacco: Never Used  . Tobacco comment: quit date the day she starts her radiation at the [facility]  Substance Use Topics  . Alcohol use: No    Alcohol/week: 0.0 standard drinks    Comment: none in 20 years  . Drug use: No    Comment: none in 20 years     Allergies   Patient has no known allergies.   Review of Systems Review of Systems  All other systems reviewed and are negative.    Physical Exam Updated Vital Signs BP 126/71 (BP Location: Left Arm)   Pulse 85   Temp 98.2 F (36.8 C) (Oral)   Resp (!) 21   Ht 5\' 9"  (1.753 m)   Wt 100.7 kg   SpO2 100%   BMI 32.78 kg/m   Physical Exam Vitals signs and nursing note reviewed.  Constitutional:      General: She is not in acute distress.    Appearance: She is well-developed. She is obese. She is not ill-appearing, toxic-appearing or diaphoretic.  HENT:     Head: Normocephalic and atraumatic.     Right Ear: External ear normal.     Left Ear: External ear normal.  Eyes:      Conjunctiva/sclera: Conjunctivae normal.     Pupils: Pupils are equal, round, and reactive to light.  Neck:     Musculoskeletal: Normal range of motion and neck supple.     Trachea: Phonation normal.  Cardiovascular:     Rate and Rhythm: Normal rate and regular rhythm.     Heart sounds: Normal heart sounds.  Pulmonary:     Effort: Pulmonary effort is normal.     Breath sounds: Normal breath sounds.  Musculoskeletal:        General: No swelling, tenderness or signs of injury.  Skin:    General: Skin is warm and dry.  Neurological:     Mental Status: She is alert and oriented to person, place, and time.     Cranial Nerves: No cranial nerve deficit.     Sensory: No sensory deficit.     Motor: No abnormal muscle tone.     Coordination: Coordination normal.  Psychiatric:        Mood and Affect: Mood normal.        Behavior: Behavior normal.      ED Treatments / Results  Labs (all labs ordered are listed, but only abnormal results are displayed) Labs Reviewed - No data to display  EKG None  Radiology No results found.  Procedures Procedures (including critical care time)  Medications Ordered in ED Medications  ketorolac (TORADOL) 30 MG/ML injection 30 mg (30 mg Intramuscular Given 05/14/19 0809)     Initial Impression / Assessment and Plan / ED Course  I have reviewed the triage vital signs and the nursing notes.  Pertinent labs & imaging results that were available during my care of the patient were reviewed by me and considered in my medical decision making (see chart for details).         Patient Vitals for the past 24 hrs:  BP  Temp Temp src Pulse Resp SpO2 Height Weight  05/14/19 1000 126/71 - - 85 (!) 21 100 % - -  05/14/19 0800 125/63 - - 90 17 99 % 5\' 9"  (1.753 m) 100.7 kg  05/14/19 0759 129/66 98.2 F (36.8 C) Oral 88 20 98 % - -  05/14/19 0758 - - - - - 99 % - -    10:14 AM Reevaluation with update and discussion. After initial assessment and  treatment, an updated evaluation reveals she is more comfortable now after Toradol.  Findings discussed with the patient and all questions were answered. Daleen Bo   Medical Decision Making: Nonspecific lower extremity pain, previously diagnosed with neuropathy, symptoms are nonspecific and evaluation is reassuring.  Doubt spinal myelopathy or radiculopathy.  Doubt fracture or tendinitis.  No evidence for cellulitis or localized infection.  There is no indication for further treatment in the ED setting.  CRITICAL CARE- Yes Performed by: Daleen Bo  Nursing Notes Reviewed/ Care Coordinated Applicable Imaging Reviewed Interpretation of Laboratory Data incorporated into ED treatment  The patient appears reasonably screened and/or stabilized for discharge and I doubt any other medical condition or other [facility] requiring further screening, evaluation, or treatment in the ED at this time prior to discharge.  Plan: Home Medications-continue usual medication and use ibuprofen for pain; Home Treatments-heat therapy; return here if the recommended treatment, does not improve the symptoms; Recommended follow up-PCP,. prn   Final Clinical Impressions(s) / ED Diagnoses   Final diagnoses:  Pain in both lower extremities    ED Discharge Orders    None       Daleen Bo, MD 05/14/19 1015

## 2019-05-14 NOTE — ED Triage Notes (Signed)
Patient arrived by EMS. Patient c/o bilateral leg pain x 3 years.   Gabapentin was taken at home , patient had no relief with medication per EMS.   Patient used oxygen at home.

## 2019-05-14 NOTE — ED Notes (Signed)
[facility] has been contacted for patient transport.

## 2019-05-14 NOTE — ED Notes (Signed)
Patient was unable to sign for discharge but she verbalized understanding.

## 2019-05-14 NOTE — Discharge Instructions (Addendum)
Try using heat on the sore areas 3-4 times a day.  For pain use ibuprofen 400 mg 3 times a day with meals.  Follow-up with your primary care doctor as needed for further care and treatment.

## 2019-05-20 ENCOUNTER — Emergency Department (HOSPITAL_COMMUNITY)
Admission: EM | Admit: 2019-05-20 | Discharge: 2019-05-20 | Disposition: A | Discharge status: Home / Self Care | Payer: Medicare Other | Attending: Emergency Medicine | Admitting: Emergency Medicine

## 2019-05-20 ENCOUNTER — Other Ambulatory Visit: Payer: Self-pay

## 2019-05-20 ENCOUNTER — Encounter (HOSPITAL_COMMUNITY): Payer: Self-pay | Admitting: Emergency Medicine

## 2019-05-20 DIAGNOSIS — B20 Human immunodeficiency virus [HIV] disease: Secondary | ICD-10-CM | POA: Insufficient documentation

## 2019-05-20 DIAGNOSIS — J449 Chronic obstructive pulmonary disease, unspecified: Secondary | ICD-10-CM | POA: Insufficient documentation

## 2019-05-20 DIAGNOSIS — G2581 Restless legs syndrome: Secondary | ICD-10-CM | POA: Diagnosis not present

## 2019-05-20 LAB — BASIC METABOLIC PANEL
Anion gap: 11 (ref 5–15)
BUN: 9 mg/dL (ref 8–23)
CO2: 29 mmol/L (ref 22–32)
Calcium: 9.5 mg/dL (ref 8.9–10.3)
Chloride: 102 mmol/L (ref 98–111)
Creatinine, Ser: 1.15 mg/dL — ABNORMAL HIGH (ref 0.44–1.00)
GFR calc Af Amer: 55 mL/min — ABNORMAL LOW (ref >60)
GFR calc non Af Amer: 48 mL/min — ABNORMAL LOW (ref >60)
Glucose, Bld: 109 mg/dL — ABNORMAL HIGH (ref 70–99)
Potassium: 3.4 mmol/L — ABNORMAL LOW (ref 3.5–5.1)
Sodium: 142 mmol/L (ref 135–145)

## 2019-05-20 LAB — CBC WITH DIFFERENTIAL/PLATELET
Abs Immature Granulocytes: 0.01 10*3/uL (ref 0.00–0.07)
Basophils Absolute: 0 10*3/uL (ref 0.0–0.1)
Basophils Relative: 0 %
Eosinophils Absolute: 0.1 10*3/uL (ref 0.0–0.5)
Eosinophils Relative: 2 %
HCT: 44.3 % (ref 36.0–46.0)
Hemoglobin: 13.2 g/dL (ref 12.0–15.0)
Immature Granulocytes: 0 %
Lymphocytes Relative: 50 %
Lymphs Abs: 3.5 10*3/uL (ref 0.7–4.0)
MCH: 28 pg (ref 26.0–34.0)
MCHC: 29.8 g/dL — ABNORMAL LOW (ref 30.0–36.0)
MCV: 93.9 fL (ref 80.0–100.0)
Monocytes Absolute: 0.5 10*3/uL (ref 0.1–1.0)
Monocytes Relative: 8 %
Neutro Abs: 2.8 10*3/uL (ref 1.7–7.7)
Neutrophils Relative %: 40 %
Platelets: 237 10*3/uL (ref 150–400)
RBC: 4.72 MIL/uL (ref 3.87–5.11)
RDW: 17.5 % — ABNORMAL HIGH (ref 11.5–15.5)
WBC: 7 10*3/uL (ref 4.0–10.5)
nRBC: 0 % (ref 0.0–0.2)

## 2019-05-20 LAB — MAGNESIUM: Magnesium: 1.9 mg/dL (ref 1.7–2.4)

## 2019-05-20 MED ORDER — ROPINIROLE HCL 1 MG PO TABS
1.0000 mg | ORAL_TABLET | Freq: Once | ORAL | Status: AC
  Administered 2019-05-20: 1 mg via ORAL
  Filled 2019-05-20: qty 1

## 2019-05-20 MED ORDER — ROPINIROLE HCL 1 MG PO TABS: 1.0000 mg | ORAL_TABLET | Freq: Every day | ORAL | 1 refills | Status: DC

## 2019-05-20 NOTE — ED Triage Notes (Signed)
Pt transported from home by [facility] for restless leg syndrome. Pt may have ran out of meds.

## 2019-05-20 NOTE — ED Provider Notes (Addendum)
TIME SEEN: 4:57 AM  CHIEF COMPLAINT: Restless leg  HPI: Patient is a 73 year old female with history of HIV, COPD on 3 to 4 L nasal cannula at home, lung cancer status post radiation, neuropathy and restless leg syndrome who presents to the emergency department with complaints of restless leg.  States she has been off of Requip since October.  States after she was discharged from the hospital for a stroke it was stopped at [facility].  States she is unable to sleep because of this.  She is on gabapentin 800 mg 3 times daily.  Does not see a pain management doctor.  Is not on narcotics chronically.  No injury to the leg.  No new numbness or weakness.  Describes the sensation as tingling in her feet and ankles and a burning pain.  No back pain.  No chest pain or shortness of breath.  Able to ambulate.  Denies history of injury to her legs.  Appt with [facility] Jan 14th.  ROS: See HPI Constitutional: no fever  Eyes: no drainage  ENT: no runny nose   Cardiovascular:  no chest pain  Resp: no SOB  GI: no vomiting GU: no dysuria Integumentary: no rash  Allergy: no hives  Musculoskeletal: no leg swelling  Neurological: no slurred speech ROS otherwise negative  PAST MEDICAL HISTORY/PAST SURGICAL HISTORY:  Past Medical History:  Diagnosis Date  . Asthma   . Atypical squamous cells of undetermined significance (ASCUS) on Papanicolaou smear of cervix   . COPD (chronic obstructive pulmonary disease) ([place])   . H/O drainage of abscess    left axilla, from left breast  . Hep B w/o coma   . HIV (human immunodeficiency virus infection) ([place])   . Hyperlipidemia   . Lung cancer ([place]) 12/14/13   LUL Adenocarcinoma  . Neuromuscular disorder ([place])    fingers/feet neuropathy  . Non-small cell lung cancer ([place])   . S/P radiation therapy 01/26/14-02/02/14   sbrt  lt upper lung-54Gy/70fx    MEDICATIONS:  Prior to Admission medications   Medication Sig Start Date End Date Taking?  Authorizing Provider  albuterol (PROVENTIL HFA;VENTOLIN HFA) 108 (90 BASE) MCG/ACT inhaler Inhale 1 puff into the lungs every 6 (six) hours as needed for shortness of breath.     [provider]  albuterol (PROVENTIL) (2.5 MG/3ML) 0.083% nebulizer solution Take 3 mLs (2.5 mg total) by nebulization every 4 (four) hours as needed for wheezing or shortness of breath. 01/18/19   Jaynee Eagles, PA-C  apixaban (ELIQUIS) 5 MG TABS tablet Take 2 tablets (10 mg total) by mouth 2 (two) times daily for 2 days. 03/10/19 03/12/19  Hosie Poisson, MD  apixaban (ELIQUIS) 5 MG TABS tablet Take 1 tablet (5 mg total) by mouth 2 (two) times daily. 03/13/19   Hosie Poisson, MD  Ascorbic Acid (VITAMIN C) 100 MG tablet Take 100 mg by mouth daily.    [provider]  ATRIPLA 600-200-300 MG tablet TAKE 1 TABLET BY MOUTH EVERY NIGHT AT BEDTIME Patient taking differently: Take 1 tablet by mouth at bedtime.  09/03/15   Truman Hayward, MD  B Complex-Biotin-FA (VITAMIN B50 COMPLEX PO) Take 1 tablet by mouth daily.    [provider]  Calcium Carbonate-Vitamin D (CALCIUM 600+D) 600-400 MG-UNIT per tablet Take 1 tablet by mouth daily.    [provider]  DULoxetine (CYMBALTA) 20 MG capsule Take 1 capsule (20 mg total) by mouth daily. 03/10/19   Hosie Poisson, MD  Fluticasone-Salmeterol (ADVAIR) 500-50 MCG/DOSE AEPB Inhale 1 puff into the lungs every 12 (twelve) hours.    [provider]  gabapentin (NEURONTIN) 300 MG capsule TAKE 2 CAPSULES BY MOUTH THREE TIMES A DAY Patient taking differently: Take 600 mg by mouth 3 (three) times daily.  01/14/15   Truman Hayward, MD  loratadine (CLARITIN) 10 MG tablet Take 10 mg by mouth 2 (two) times daily.     [provider]  montelukast (SINGULAIR) 10 MG tablet Take 1 tablet (10 mg total) by mouth at bedtime. 12/02/12   Michel Bickers, MD  nicotine (NICODERM CQ - DOSED IN MG/24 HOURS) 21 mg/24hr patch Place 1 patch (21 mg total) onto the  skin daily. 03/10/19   Hosie Poisson, MD  OLANZapine (ZYPREXA) 2.5 MG tablet Take 1 tablet (2.5 mg total) by mouth at bedtime. 03/10/19   Hosie Poisson, MD  omega-3 acid ethyl esters (LOVAZA) 1 g capsule Take 1 g by mouth daily.    [provider]  polyethylene glycol (MIRALAX / GLYCOLAX) 17 g packet Take 17 g by mouth daily. 03/10/19   Hosie Poisson, MD  SPIRIVA RESPIMAT 2.5 MCG/ACT AERS INHALE 2 PUFFS INTO THE LUNGS DAILY Patient taking differently: Inhale 2 puffs into the lungs daily.  09/24/14   Rigoberto Noel, MD  VITAMIN A PO Take 1 tablet by mouth daily.    [provider]    ALLERGIES:  No Known Allergies  SOCIAL HISTORY:  Social History   Tobacco Use  . Smoking status: Current Every Day Smoker    Packs/day: 1.00    Years: 40.00    Pack years: 40.00    Types: Cigarettes  . Smokeless tobacco: Never Used  . Tobacco comment: quit date the day she starts her radiation at the [facility]  Substance Use Topics  . Alcohol use: No    Alcohol/week: 0.0 standard drinks    Comment: none in 20 years    FAMILY HISTORY: Family History  Problem Relation Age of Onset  . Pneumonia Mother   . Hypertension Sister   . Diabetes Sister   . Cancer Sister        pancreatic and lung  . Cancer Brother        pancreatic  . Cancer Brother        pancreatic    EXAM: BP 106/89 (BP Location: Right Arm)   Pulse 79   Temp 98.1 F (36.7 C) (Oral)   Resp 18   Ht 5\' 9"  (1.753 m)   Wt 91.6 kg   SpO2 96%   BMI 29.83 kg/m  CONSTITUTIONAL: Alert and oriented and responds appropriately to questions.  Elderly, chronically ill-appearing but not in distress HEAD: Normocephalic EYES: Conjunctivae clear, pupils appear equal, EOM appear intact ENT: normal nose; moist mucous membranes NECK: Supple, normal ROM CARD: RRR; S1 and S2 appreciated; no murmurs, no clicks, no rubs, no gallops RESP: Normal chest excursion without splinting or tachypnea; breath sounds clear and equal  bilaterally; no wheezes, no rhonchi, no rales, no hypoxia or respiratory distress, speaking full sentences ABD/GI: Normal bowel sounds; non-distended; soft, non-tender, no rebound, no guarding, no peritoneal signs, no hepatosplenomegaly BACK:  The back appears normal EXT: Normal ROM in all joints; no deformity noted, no edema; no cyanosis, 2+ DP pulses bilaterally, no calf tenderness or swelling, no redness or warmth, no joint effusions, compartments soft in her lower extremities SKIN: Normal color for age and race; warm; no rash on exposed skin NEURO:  Moves all extremities equally PSYCH: The patient's mood and manner are appropriate.   MEDICAL DECISION MAKING: Patient here requesting refill of her Requip for her restless leg syndrome.  She does not member the dose that she was on.  I do not see any records that she was on Requip within our system but she specifically asked for ropinirole.  States she was previously taking it once at bedtime.  We will start her on 1 mg nightly.  She has follow-up with her doctor January 14.  Will provide with prescription for the same to last her until she can see her PCP.  Patient very grateful for care.  Will discharge after she has received a dose of Requip here in the emergency department.  No sign of arterial obstruction, DVT, cellulitis, septic arthritis, gout, compartment syndrome on exam.  Denies any history of injury.   Basic labs obtained in triage.  No leukocytosis.  Normal potassium and magnesium.  At this time, I do not feel there is any life-threatening condition present. I have reviewed, interpreted and discussed all results (EKG, imaging, lab, urine as appropriate) and exam findings with patient/family. I have reviewed nursing notes and appropriate previous records.  I feel the patient is safe to be discharged home without further emergent workup and can continue workup as an outpatient as needed. Discussed usual and customary return precautions.  Patient/family verbalize understanding and are comfortable with this plan.  Outpatient follow-up has been provided as needed. All questions have been answered.   MARLETTE CURVIN was evaluated in Emergency Department on 05/20/2019 for the symptoms described in the history of present illness. She was evaluated in the context of the global COVID-19 pandemic, which necessitated consideration that the patient might be at risk for infection with the SARS-CoV-2 virus that causes COVID-19. Institutional protocols and algorithms that pertain to the evaluation of patients at risk for COVID-19 are in a state of rapid change based on information released by regulatory bodies including the CDC and federal and state organizations. These policies and algorithms were followed during the patient's care in the ED.  Patient was seen wearing N95, face shield, gloves.    Ward, Delice Bison, DO 05/20/19 0533    Ward, Delice Bison, DO 05/20/19 [phone]

## 2019-05-20 NOTE — ED Notes (Signed)
Awaiting medication from pharmacy, contacted x2

## 2019-06-28 ENCOUNTER — Other Ambulatory Visit: Payer: Self-pay

## 2019-06-28 ENCOUNTER — Other Ambulatory Visit: Payer: Medicare Other

## 2019-06-28 DIAGNOSIS — B20 Human immunodeficiency virus [HIV] disease: Secondary | ICD-10-CM

## 2019-06-28 DIAGNOSIS — Z79899 Other long term (current) drug therapy: Secondary | ICD-10-CM

## 2019-06-29 LAB — T-HELPER CELL (CD4) - (RCID CLINIC ONLY)
CD4 % Helper T Cell: 26 % — ABNORMAL LOW (ref 33–65)
CD4 T Cell Abs: 587 /uL (ref 400–1790)

## 2019-07-02 ENCOUNTER — Encounter (HOSPITAL_COMMUNITY): Payer: Self-pay

## 2019-07-02 ENCOUNTER — Other Ambulatory Visit: Payer: Self-pay

## 2019-07-02 ENCOUNTER — Ambulatory Visit (HOSPITAL_COMMUNITY)
Admission: EM | Admit: 2019-07-02 | Discharge: 2019-07-02 | Disposition: A | Discharge status: Home / Self Care | Payer: Medicare Other | Attending: Family Medicine | Admitting: Family Medicine

## 2019-07-02 DIAGNOSIS — L299 Pruritus, unspecified: Secondary | ICD-10-CM | POA: Diagnosis not present

## 2019-07-02 DIAGNOSIS — F424 Excoriation (skin-picking) disorder: Secondary | ICD-10-CM

## 2019-07-02 MED ORDER — DIPHENHYDRAMINE HCL 25 MG PO CAPS
ORAL_CAPSULE | ORAL | Status: AC
  Filled 2019-07-02: qty 1

## 2019-07-02 MED ORDER — HYDROXYZINE HCL 25 MG PO TABS: 25.0000 mg | ORAL_TABLET | Freq: Four times a day (QID) | ORAL | 0 refills | Status: DC

## 2019-07-02 MED ORDER — DIPHENHYDRAMINE HCL 25 MG PO CAPS
25.0000 mg | ORAL_CAPSULE | Freq: Once | ORAL | Status: AC
  Administered 2019-07-02: 25 mg via ORAL

## 2019-07-02 MED ORDER — GABAPENTIN 600 MG PO TABS: 600.0000 mg | ORAL_TABLET | Freq: Three times a day (TID) | ORAL | 0 refills | Status: DC

## 2019-07-02 MED ORDER — DIPHENHYDRAMINE HCL 50 MG/ML IJ SOLN: 25.0000 mg | Freq: Once | INTRAMUSCULAR | Status: DC

## 2019-07-02 NOTE — Discharge Instructions (Addendum)
Make sure to use lotion on your skin to keep it from itching Take hydroxyzine as needed for itching Gabapentin is refilled See your PCP next week

## 2019-07-02 NOTE — ED Provider Notes (Signed)
[facility]    CSN: [id number] Arrival date & time: 07/02/19  1400      History   Chief Complaint Chief Complaint  Patient presents with   bed bugs    HPI [name] is a 74 y.o. female.   HPI  Patient is here stating that she has bedbugs.  She states she has an itchy rash on her upper back.  She states that it itches "like crazy".  She is covered her back with tape to keep her from scratching her skin open.  In spite of this she has multiple open wounds across her upper back and shoulders.  She lives with her sister.  She feels certain that her sister has bedbugs.  She states she seen bugs in the house and when she smashes them there is blood in them.  They have an exterminator coming this week. Patient is HIV positive.  She is noncompliant with following up with the infectious disease people.  She is taking her medications. She has multiple significant medical problems including chronic kidney disease, renal and splenic infarcts, CVA, PE, stroke, history of lung cancer, thoracic aorta atherosclerosis.   Past Medical History:  Diagnosis Date   Asthma    Atypical squamous cells of undetermined significance (ASCUS) on Papanicolaou smear of cervix    COPD (chronic obstructive pulmonary disease) ([facility])    H/O drainage of abscess    left axilla, from left breast   Hep B w/o coma    HIV (human immunodeficiency virus infection) ([place])    Hyperlipidemia    Lung cancer ([place]) 12/14/13   LUL Adenocarcinoma   Neuromuscular disorder ([facility])    fingers/feet neuropathy   Non-small cell lung cancer ([name])    S/P radiation therapy 01/26/14-02/02/14   sbrt  lt upper lung-54Gy/74fx    Patient Active Problem List   Diagnosis Date Noted   DNR (do not resuscitate)    Altered mental status    Acute pulmonary embolism without acute cor pulmonale (HCC)    Acute ischemic right MCA stroke [facility])    Palliative care encounter    Aortic embolism or thrombosis ([place])     CVA (cerebral vascular accident) ([place]) 03/02/2019   COPD (chronic obstructive pulmonary disease) ([place]) 03/01/2019   PE (pulmonary thromboembolism) ([place]) 03/01/2019   Tobacco abuse 03/01/2019   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   CKD (chronic kidney disease), stage III 03/01/2019   Generalized weakness 03/01/2019   Fall at home    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[facility])    Splenic infarct    Acute and chronic respiratory failure (acute-on-chronic) ([place]) 02/11/2019   Lung nodule 10/12/2015   History of lung cancer 09/08/2015   Thoracic aorta atherosclerosis ([place]) 09/08/2015   Sepsis ([place]) 03/28/2015   CAP (community acquired pneumonia) 03/28/2015   UTI (lower urinary tract infection) 03/28/2015   Respiratory failure ([place]) 03/28/2015   Non-small cell lung cancer ([place])    Malignant neoplasm of upper lobe, bronchus or lung 01/03/2014   Acute hypoxemic respiratory failure ([place]) 10/31/2013   COPD with acute exacerbation ([place]) 10/30/2013   COPD exacerbation ([place]) 10/30/2013   Other and unspecified noninfectious gastroenteritis and colitis(558.9) 11/20/2012   Rectal bleed 11/19/2012   Obesity 05/05/2012   Renal insufficiency 05/05/2012   Hyperglycemia 05/05/2012   Asthma 12/10/2011   Cigarette smoker 12/10/2011   Hyperlipidemia 12/11/2010   Hypertension 12/11/2010   ALLERGIC RHINITIS, SEASONAL 07/03/2010   COPD 07/03/2010   Human immunodeficiency virus (HIV) disease ([place]) 12/09/2006   DEPRESSION 12/09/2006  BREAST MASS, BENIGN 12/09/2006   OSTEOPOROSIS 12/09/2006    Past Surgical History:  Procedure Laterality Date   ABCESS DRAINAGE     abcess under Left arm, abcess from L breast   BUBBLE STUDY  03/08/2019   Procedure: BUBBLE STUDY;  Surgeon: [name], MD;  Location: [facility];  Service: Cardiovascular;;   removal of abnormal cells on uterus     TEE WITHOUT CARDIOVERSION N/A 03/08/2019   Procedure: TRANSESOPHAGEAL ECHOCARDIOGRAM (TEE);  Surgeon:  [name], MD;  Location: [facility];  Service: Cardiovascular;  Laterality: N/A;   thyroid gland removed      OB History   No obstetric history on file.      Home Medications    Prior to Admission medications   Medication Sig Start Date End Date Taking? Authorizing Provider  albuterol (PROVENTIL HFA;VENTOLIN HFA) 108 (90 BASE) MCG/ACT inhaler Inhale 1 puff into the lungs every 6 (six) hours as needed for shortness of breath.     [provider]  albuterol (PROVENTIL) (2.5 MG/3ML) 0.083% nebulizer solution Take 3 mLs (2.5 mg total) by nebulization every 4 (four) hours as needed for wheezing or shortness of breath. 01/18/19   Jaynee Eagles, PA-C  apixaban (ELIQUIS) 5 MG TABS tablet Take 2 tablets (10 mg total) by mouth 2 (two) times daily for 2 days. 03/10/19 03/12/19  Hosie Poisson, MD  apixaban (ELIQUIS) 5 MG TABS tablet Take 1 tablet (5 mg total) by mouth 2 (two) times daily. 03/13/19   Hosie Poisson, MD  Ascorbic Acid (VITAMIN C) 100 MG tablet Take 100 mg by mouth daily.    [provider]  ATRIPLA 600-200-300 MG tablet TAKE 1 TABLET BY MOUTH EVERY NIGHT AT BEDTIME Patient taking differently: Take 1 tablet by mouth at bedtime.  09/03/15   Truman Hayward, MD  B Complex-Biotin-FA (VITAMIN B50 COMPLEX PO) Take 1 tablet by mouth daily.    [provider]  Calcium Carbonate-Vitamin D (CALCIUM 600+D) 600-400 MG-UNIT per tablet Take 1 tablet by mouth daily.    [provider]  DULoxetine (CYMBALTA) 20 MG capsule Take 1 capsule (20 mg total) by mouth daily. 03/10/19   Hosie Poisson, MD  Fluticasone-Salmeterol (ADVAIR) 500-50 MCG/DOSE AEPB Inhale 1 puff into the lungs every 12 (twelve) hours.    [provider]  gabapentin (NEURONTIN) 600 MG tablet Take 1 tablet (600 mg total) by mouth 3 (three) times daily. 07/02/19   Raylene Everts, MD  hydrOXYzine (ATARAX/VISTARIL) 25 MG tablet Take 1 tablet (25 mg total) by mouth every 6 (six) hours. 07/02/19    Raylene Everts, MD  loratadine (CLARITIN) 10 MG tablet Take 10 mg by mouth 2 (two) times daily.     [provider]  montelukast (SINGULAIR) 10 MG tablet Take 1 tablet (10 mg total) by mouth at bedtime. 12/02/12   Michel Bickers, MD  nicotine (NICODERM CQ - DOSED IN MG/24 HOURS) 21 mg/24hr patch Place 1 patch (21 mg total) onto the skin daily. 03/10/19   Hosie Poisson, MD  OLANZapine (ZYPREXA) 2.5 MG tablet Take 1 tablet (2.5 mg total) by mouth at bedtime. 03/10/19   Hosie Poisson, MD  omega-3 acid ethyl esters (LOVAZA) 1 g capsule Take 1 g by mouth daily.    [provider]  polyethylene glycol (MIRALAX / GLYCOLAX) 17 g packet Take 17 g by mouth daily. 03/10/19   Hosie Poisson, MD  rOPINIRole (REQUIP) 1 MG tablet Take 1 tablet (1 mg total) by mouth at bedtime.  05/20/19   Ward, Kristen N, DO  SPIRIVA RESPIMAT 2.5 MCG/ACT AERS INHALE 2 PUFFS INTO THE LUNGS DAILY Patient taking differently: Inhale 2 puffs into the lungs daily.  09/24/14   Rigoberto Noel, MD  VITAMIN A PO Take 1 tablet by mouth daily.    [provider]    Family History Family History  Problem Relation Age of Onset   Pneumonia Mother    Hypertension Sister    Diabetes Sister    Cancer Sister        pancreatic and lung   Cancer Brother        pancreatic   Cancer Brother        pancreatic    Social History Social History   Tobacco Use   Smoking status: Current Every Day Smoker    Packs/day: 1.00    Years: 40.00    Pack years: 40.00    Types: Cigarettes   Smokeless tobacco: Never Used   Tobacco comment: quit date the day she starts her radiation at the [facility]  Substance Use Topics   Alcohol use: No    Alcohol/week: 0.0 standard drinks    Comment: none in 20 years   Drug use: No    Comment: none in 20 years     Allergies   Patient has no known allergies.   Review of Systems Review of Systems  Constitutional: Negative for chills and fever.  HENT: Negative for  congestion and hearing loss.   Eyes: Negative for pain.  Respiratory: Negative for cough and shortness of breath.   Cardiovascular: Negative for chest pain and leg swelling.  Gastrointestinal: Negative for abdominal pain, constipation and diarrhea.  Genitourinary: Negative for dysuria and frequency.  Musculoskeletal: Negative for myalgias.  Skin: Positive for rash and wound.  Neurological: Negative for dizziness, seizures and headaches.  Psychiatric/Behavioral: The patient is not nervous/anxious.      Physical Exam Triage Vital Signs ED Triage Vitals  Enc Vitals Group     BP 07/02/19 1508 139/71     Pulse Rate 07/02/19 1508 84     Resp 07/02/19 1508 18     Temp 07/02/19 1508 98.2 F (36.8 C)     Temp src --      SpO2 07/02/19 1508 98 %     Weight 07/02/19 1509 230 lb (104.3 kg)     Height --      Head Circumference --      Peak Flow --      Pain Score 07/02/19 1508 8     Pain Loc --      Pain Edu? --      Excl. in [place]? --    No data found.  Updated Vital Signs BP 139/71 (BP Location: Right Arm)    Pulse 84    Temp 98.2 F (36.8 C)    Resp 18    Wt 104.3 kg    SpO2 98%    BMI 33.97 kg/m    Physical Exam Constitutional:      General: She is not in acute distress.    Appearance: She is well-developed. She is obese.     Comments: Poor eye contact.  Poor fund of knowledge.  HENT:     Head: Normocephalic and atraumatic.     Mouth/Throat:     Comments: Mask in place Eyes:     Conjunctiva/sclera: Conjunctivae normal.     Pupils: Pupils are equal, round, and reactive to light.  Cardiovascular:  Rate and Rhythm: Normal rate and regular rhythm.  Pulmonary:     Effort: Pulmonary effort is normal. No respiratory distress.     Breath sounds: Rhonchi present.  Abdominal:     General: There is no distension.     Palpations: Abdomen is soft.  Musculoskeletal:        General: Normal range of motion.     Cervical back: Normal range of motion and neck supple.  Skin:     General: Skin is warm and dry.     Comments: Multiple excoriated lesions across upper shoulders, all within reach of hands.  No cellulitis or infection.  They measure anywhere from 3 to 10 mm across.  Neurological:     Mental Status: Mental status is at baseline.  Psychiatric:        Behavior: Behavior normal.      UC Treatments / Results  Labs (all labs ordered are listed, but only abnormal results are displayed) Labs Reviewed - No data to display  EKG   Radiology No results found.  Procedures Procedures (including critical care time)  Medications Ordered in UC Medications  diphenhydrAMINE (BENADRYL) injection 25 mg (has no administration in time range)    Initial Impression / Assessment and Plan / UC Course  I have reviewed the triage vital signs and the nursing notes.  Pertinent labs & imaging results that were available during my care of the patient were reviewed by me and considered in my medical decision making (see chart for details).     Discussed that these do not look typical for bug bites, but I can still treat the itching and give her some advice.  I am refilling her gabapentin at her request.  She needs to follow-up with her primary care doctor. Final Clinical Impressions(s) / UC Diagnoses   Final diagnoses:  Itching  Excoriation (skin-picking) disorder     Discharge Instructions     Make sure to use lotion on your skin to keep it from itching Take hydroxyzine as needed for itching Gabapentin is refilled See your PCP next week   ED Prescriptions    Medication Sig Dispense Auth. Provider   hydrOXYzine (ATARAX/VISTARIL) 25 MG tablet Take 1 tablet (25 mg total) by mouth every 6 (six) hours. 12 tablet Raylene Everts, MD   gabapentin (NEURONTIN) 600 MG tablet Take 1 tablet (600 mg total) by mouth 3 (three) times daily. 90 tablet Raylene Everts, MD     PDMP not reviewed this encounter.   Raylene Everts, MD 07/02/19 [phone]

## 2019-07-02 NOTE — ED Triage Notes (Signed)
Pt states she has bed bugs. Pt states bed bugs are driving her crazy. Pt needs med refill Gabapentin 800 mg.

## 2019-07-04 LAB — COMPLETE METABOLIC PANEL WITH GFR
AG Ratio: 1 (calc) (ref 1.0–2.5)
ALT: 9 U/L (ref 6–29)
AST: 17 U/L (ref 10–35)
Albumin: 4.1 g/dL (ref 3.6–5.1)
Alkaline phosphatase (APISO): 63 U/L (ref 37–153)
BUN/Creatinine Ratio: 10 (calc) (ref 6–22)
BUN: 10 mg/dL (ref 7–25)
CO2: 32 mmol/L (ref 20–32)
Calcium: 9.4 mg/dL (ref 8.6–10.4)
Chloride: 102 mmol/L (ref 98–110)
Creat: 0.98 mg/dL — ABNORMAL HIGH (ref 0.60–0.93)
GFR, Est African American: 67 mL/min/{1.73_m2} (ref >=60)
GFR, Est Non African American: 58 mL/min/{1.73_m2} — ABNORMAL LOW (ref >=60)
Globulin: 4 g/dL (calc) — ABNORMAL HIGH (ref 1.9–3.7)
Glucose, Bld: 89 mg/dL (ref 65–99)
Potassium: 3.8 mmol/L (ref 3.5–5.3)
Sodium: 139 mmol/L (ref 135–146)
Total Bilirubin: 0.3 mg/dL (ref 0.2–1.2)
Total Protein: 8.1 g/dL (ref 6.1–8.1)

## 2019-07-04 LAB — HIV-1 RNA QUANT-NO REFLEX-BLD
HIV 1 RNA Quant: 9690 copies/mL — ABNORMAL HIGH
HIV-1 RNA Quant, Log: 3.99 Log copies/mL — ABNORMAL HIGH

## 2019-07-04 LAB — CBC WITH DIFFERENTIAL/PLATELET
Absolute Monocytes: 621 cells/uL (ref 200–950)
Basophils Absolute: 41 cells/uL (ref 0–200)
Basophils Relative: 0.6 %
Eosinophils Absolute: 97 cells/uL (ref 15–500)
Eosinophils Relative: 1.4 %
HCT: 48.5 % — ABNORMAL HIGH (ref 35.0–45.0)
Hemoglobin: 14.9 g/dL (ref 11.7–15.5)
Lymphs Abs: 2422 cells/uL (ref 850–3900)
MCH: 26.1 pg — ABNORMAL LOW (ref 27.0–33.0)
MCHC: 30.7 g/dL — ABNORMAL LOW (ref 32.0–36.0)
MCV: 85.1 fL (ref 80.0–100.0)
MPV: 10.6 fL (ref 7.5–12.5)
Monocytes Relative: 9 %
Neutro Abs: 3719 cells/uL (ref 1500–7800)
Neutrophils Relative %: 53.9 %
Platelets: 258 10*3/uL (ref 140–400)
RBC: 5.7 10*6/uL — ABNORMAL HIGH (ref 3.80–5.10)
RDW: 15.6 % — ABNORMAL HIGH (ref 11.0–15.0)
Total Lymphocyte: 35.1 %
WBC: 6.9 10*3/uL (ref 3.8–10.8)

## 2019-07-04 LAB — LIPID PANEL
Cholesterol: 180 mg/dL (ref <200)
HDL: 48 mg/dL — ABNORMAL LOW (ref >=50)
LDL Cholesterol (Calc): 107 mg/dL (calc) — ABNORMAL HIGH
Non-HDL Cholesterol (Calc): 132 mg/dL (calc) — ABNORMAL HIGH (ref <130)
Total CHOL/HDL Ratio: 3.8 (calc) (ref <5.0)
Triglycerides: 132 mg/dL (ref <150)

## 2019-07-04 LAB — RPR: RPR Ser Ql: NONREACTIVE

## 2019-07-04 LAB — HLA B*5701: HLA-B*5701 w/rflx HLA-B High: NEGATIVE

## 2019-07-05 ENCOUNTER — Other Ambulatory Visit: Payer: Self-pay | Admitting: Infectious Disease

## 2019-07-05 DIAGNOSIS — B20 Human immunodeficiency virus [HIV] disease: Secondary | ICD-10-CM

## 2019-07-06 ENCOUNTER — Telehealth: Payer: Self-pay | Admitting: *Deleted

## 2019-07-06 NOTE — Telephone Encounter (Signed)
-----   Message from Truman Hayward, MD sent at 07/05/2019  4:06 PM EST ----- Can we add a genotype ----- Message ----- From: Chey[place] Adas Lab Results In Sent: 06/28/2019   8:29 PM EST To: Truman Hayward, MD

## 2019-07-06 NOTE — Telephone Encounter (Signed)
Relayed to Tammy in lab. Landis Gandy, RN

## 2019-07-12 ENCOUNTER — Other Ambulatory Visit: Payer: Self-pay

## 2019-07-12 ENCOUNTER — Ambulatory Visit: Payer: Self-pay | Admitting: *Deleted

## 2019-07-12 ENCOUNTER — Ambulatory Visit (INDEPENDENT_AMBULATORY_CARE_PROVIDER_SITE_OTHER): Payer: Medicare Other | Admitting: Infectious Disease

## 2019-07-12 ENCOUNTER — Encounter: Payer: Self-pay | Admitting: Infectious Disease

## 2019-07-12 ENCOUNTER — Telehealth: Payer: Self-pay | Admitting: *Deleted

## 2019-07-12 VITALS — BP 131/75 | HR 66 | Temp 98.3°F | Ht 69.0 in | Wt 186.0 lb

## 2019-07-12 DIAGNOSIS — I63511 Cerebral infarction due to unspecified occlusion or stenosis of right middle cerebral artery: Secondary | ICD-10-CM

## 2019-07-12 DIAGNOSIS — I700 Atherosclerosis of aorta: Secondary | ICD-10-CM | POA: Diagnosis not present

## 2019-07-12 DIAGNOSIS — Z720 Tobacco use: Secondary | ICD-10-CM | POA: Diagnosis not present

## 2019-07-12 DIAGNOSIS — I2699 Other pulmonary embolism without acute cor pulmonale: Secondary | ICD-10-CM

## 2019-07-12 DIAGNOSIS — N1832 Chronic kidney disease, stage 3b: Secondary | ICD-10-CM

## 2019-07-12 DIAGNOSIS — J441 Chronic obstructive pulmonary disease with (acute) exacerbation: Secondary | ICD-10-CM

## 2019-07-12 DIAGNOSIS — Z23 Encounter for immunization: Secondary | ICD-10-CM

## 2019-07-12 DIAGNOSIS — I7410 Embolism and thrombosis of unspecified parts of aorta: Secondary | ICD-10-CM

## 2019-07-12 DIAGNOSIS — B20 Human immunodeficiency virus [HIV] disease: Secondary | ICD-10-CM

## 2019-07-12 DIAGNOSIS — I10 Essential (primary) hypertension: Secondary | ICD-10-CM

## 2019-07-12 DIAGNOSIS — W57XXXA Bitten or stung by nonvenomous insect and other nonvenomous arthropods, initial encounter: Secondary | ICD-10-CM

## 2019-07-12 HISTORY — DX: Bitten or stung by nonvenomous insect and other nonvenomous arthropods, initial encounter: W57.XXXA

## 2019-07-12 MED ORDER — TRIAMCINOLONE ACETONIDE 0.5 % EX OINT: 1.0000 "application " | TOPICAL_OINTMENT | Freq: Two times a day (BID) | CUTANEOUS | 4 refills | Status: DC

## 2019-07-12 MED ORDER — BIKTARVY 50-200-25 MG PO TABS: 1.0000 | ORAL_TABLET | Freq: Every day | ORAL | 11 refills | Status: DC

## 2019-07-12 MED FILL — BIKTARVY 50-200-25 MG TABS: 50-200-25 | 30 days supply | Qty: 30 | Fill #0

## 2019-07-12 MED FILL — TRIAMCINOLONE 0.5% OINTMENT: 0.5 | 30 days supply | Qty: 30 | Fill #0

## 2019-07-12 NOTE — Progress Notes (Signed)
Subjective:  Chief complaint pruritus due to bedbugs  Patient ID: [name], female    DOB: December 04, 1945, 74 y.o.   MRN: [id number]  HPI  [name] is a 74 year old African-American lady living with HIV that I previously took care of at our [facility] up to 2015.  The time she been well controlled on Atripla.  She separately moved to [facility] and was under the care of Dr. Wyline Mood and there.  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  She has known comorbid hypertension COPD stage III chronic kidney disease obstructive sleep apnea tobacco abuse and prior adenocarcinoma of the left upper lung.  She was discharged to home and was placed to follow-up with follow-up with primary care.  She tells me she has been following with a primary care physician but only over the phone.  I can see in the computer largely ER visits related to restless leg syndrome and pruritus.  She has been having suffering from bedbugs at her house where she used to reside also with her sister.  Her sister was going to help her with getting her the bedbugs apparently recently died related to complications of obesity and metabolic syndrome.  Sister's daughter who could otherwise help has been quarantined due to COVID-19 infection.  She has been prescribed permethrin although this will not help with the bedbugs.  She does have multiple excoriated lesions all throughout her back and arms.  She was changed over to [facility] by Dr. Wynetta Emery in [place].  She has been off medications for nearly half a year but her viral load is not too high only the few thousand copies.  Past Medical History:  Diagnosis Date  . Asthma   . Atypical squamous cells of undetermined significance (ASCUS) on Papanicolaou smear of cervix   . COPD (chronic obstructive pulmonary  disease) ([place])   . H/O drainage of abscess    left axilla, from left breast  . Hep B w/o coma   . HIV (human immunodeficiency virus infection) ([place])   . Hyperlipidemia   . Lung cancer ([place]) 12/14/13   LUL Adenocarcinoma  . Neuromuscular disorder ([place])    fingers/feet neuropathy  . Non-small cell lung cancer ([place])   . S/P radiation therapy 01/26/14-02/02/14   sbrt  lt upper lung-54Gy/110fx    Past Surgical History:  Procedure Laterality Date  . ABCESS DRAINAGE     abcess under Left arm, abcess from L breast  . BUBBLE STUDY  03/08/2019   Procedure: BUBBLE STUDY;  Surgeon: [name], MD;  Location: [facility];  Service: Cardiovascular;;  . removal of abnormal cells on uterus    . TEE WITHOUT CARDIOVERSION N/A 03/08/2019   Procedure: TRANSESOPHAGEAL ECHOCARDIOGRAM (TEE);  Surgeon: [name], MD;  Location: [facility] ENDOSCOPY;  Service: Cardiovascular;  Laterality: N/A;  . thyroid gland removed      Family History  Problem Relation Age of Onset  . Pneumonia Mother   . Hypertension Sister   . Diabetes Sister   . Cancer Sister        pancreatic and lung  . Cancer Brother        pancreatic  . Cancer Brother        pancreatic      Social History   Socioeconomic History  . Marital status: Single    Spouse name:  Not on file  . Number of children: Not on file  . Years of education: Not on file  . Highest education level: Not on file  Occupational History  . Not on file  Tobacco Use  . Smoking status: Current Every Day Smoker    Packs/day: 1.00    Years: 40.00    Pack years: 40.00    Types: Cigarettes  . Smokeless tobacco: Never Used  . Tobacco comment: quit date the day she starts her radiation at the [facility]  Substance and Sexual Activity  . Alcohol use: No    Alcohol/week: 0.0 standard drinks    Comment: none in 20 years  . Drug use: No    Comment: none in 20 years  . Sexual activity: Not Currently    Partners: Male    Comment: given condoms  Other Topics  Concern  . Not on file  Social History Narrative  . Not on file   Social Determinants of Health   Financial Resource Strain:   . Difficulty of Paying Living Expenses: Not on file  Food Insecurity:   . Worried About Charity fundraiser in the Last Year: Not on file  . Ran Out of Food in the Last Year: Not on file  Transportation Needs:   . Lack of Transportation (Medical): Not on file  . Lack of Transportation (Non-Medical): Not on file  Physical Activity:   . Days of Exercise per Week: Not on file  . Minutes of Exercise per Session: Not on file  Stress:   . Feeling of Stress : Not on file  Social Connections:   . Frequency of Communication with Friends and Family: Not on file  . Frequency of Social Gatherings with Friends and Family: Not on file  . Attends Religious Services: Not on file  . Active Member of Clubs or Organizations: Not on file  . Attends Archivist Meetings: Not on file  . Marital Status: Not on file    No Known Allergies   Current Outpatient Medications:  .  albuterol (PROVENTIL HFA;VENTOLIN HFA) 108 (90 BASE) MCG/ACT inhaler, Inhale 1 puff into the lungs every 6 (six) hours as needed for shortness of breath. , Disp: , Rfl:  .  albuterol (PROVENTIL) (2.5 MG/3ML) 0.083% nebulizer solution, Take 3 mLs (2.5 mg total) by nebulization every 4 (four) hours as needed for wheezing or shortness of breath., Disp: 30 mL, Rfl: 0 .  apixaban (ELIQUIS) 5 MG TABS tablet, Take 1 tablet (5 mg total) by mouth 2 (two) times daily., Disp: 60 tablet, Rfl: 1 .  Ascorbic Acid (VITAMIN C) 100 MG tablet, Take 100 mg by mouth daily., Disp: , Rfl:  .  ATRIPLA 600-200-300 MG tablet, TAKE 1 TABLET BY MOUTH EVERY NIGHT AT BEDTIME (Patient taking differently: Take 1 tablet by mouth at bedtime. ), Disp: 30 tablet, Rfl: 1 .  B Complex-Biotin-FA (VITAMIN B50 COMPLEX PO), Take 1 tablet by mouth daily., Disp: , Rfl:  .  Calcium Carbonate-Vitamin D (CALCIUM 600+D) 600-400 MG-UNIT per  tablet, Take 1 tablet by mouth daily., Disp: , Rfl:  .  DULoxetine (CYMBALTA) 20 MG capsule, Take 1 capsule (20 mg total) by mouth daily., Disp: 30 capsule, Rfl: 3 .  Fluticasone-Salmeterol (ADVAIR) 500-50 MCG/DOSE AEPB, Inhale 1 puff into the lungs every 12 (twelve) hours., Disp: , Rfl:  .  gabapentin (NEURONTIN) 600 MG tablet, Take 1 tablet (600 mg total) by mouth 3 (three) times daily., Disp: 90 tablet, Rfl: 0 .  hydrOXYzine (ATARAX/VISTARIL) 25 MG tablet, Take 1 tablet (25 mg total) by mouth every 6 (six) hours., Disp: 12 tablet, Rfl: 0 .  loratadine (CLARITIN) 10 MG tablet, Take 10 mg by mouth 2 (two) times daily. , Disp: , Rfl:  .  montelukast (SINGULAIR) 10 MG tablet, Take 1 tablet (10 mg total) by mouth at bedtime., Disp: 30 tablet, Rfl: 6 .  OLANZapine (ZYPREXA) 2.5 MG tablet, Take 1 tablet (2.5 mg total) by mouth at bedtime., Disp: 30 tablet, Rfl: 0 .  omega-3 acid ethyl esters (LOVAZA) 1 g capsule, Take 1 g by mouth daily., Disp: , Rfl:  .  polyethylene glycol (MIRALAX / GLYCOLAX) 17 g packet, Take 17 g by mouth daily., Disp: 14 each, Rfl: 0 .  rOPINIRole (REQUIP) 1 MG tablet, Take 1 tablet (1 mg total) by mouth at bedtime., Disp: 30 tablet, Rfl: 1 .  SPIRIVA RESPIMAT 2.5 MCG/ACT AERS, INHALE 2 PUFFS INTO THE LUNGS DAILY (Patient taking differently: Inhale 2 puffs into the lungs daily. ), Disp: 1 Inhaler, Rfl: 0 .  VITAMIN A PO, Take 1 tablet by mouth daily., Disp: , Rfl:  .  nicotine (NICODERM CQ - DOSED IN MG/24 HOURS) 21 mg/24hr patch, Place 1 patch (21 mg total) onto the skin daily. (Patient not taking: Reported on 07/12/2019), Disp: 28 patch, Rfl: 0   Review of Systems  Constitutional: Negative for activity change, appetite change, chills, diaphoresis, fatigue, fever and unexpected weight change.  HENT: Negative for congestion, rhinorrhea, sinus pressure, sneezing, sore throat and trouble swallowing.   Eyes: Negative for photophobia and visual disturbance.  Respiratory: Negative  for cough, chest tightness, shortness of breath, wheezing and stridor.   Cardiovascular: Negative for chest pain, palpitations and leg swelling.  Gastrointestinal: Negative for abdominal distention, abdominal pain, anal bleeding, blood in stool, constipation, diarrhea, nausea and vomiting.  Genitourinary: Negative for difficulty urinating, dysuria, flank pain and hematuria.  Musculoskeletal: Negative for arthralgias, back pain, gait problem, joint swelling and myalgias.  Skin: Positive for color change, rash and wound. Negative for pallor.  Neurological: Negative for dizziness, tremors, weakness and light-headedness.  Hematological: Negative for adenopathy. Does not bruise/bleed easily.  Psychiatric/Behavioral: Negative for agitation, behavioral problems, confusion, decreased concentration, dysphoric mood and sleep disturbance. The patient is nervous/anxious.        Objective:   Physical Exam Constitutional:      General: She is not in acute distress.    Appearance: She is not diaphoretic.  HENT:     Head: Normocephalic and atraumatic.     Right Ear: External ear normal.     Left Ear: External ear normal.     Nose: Nose normal.     Mouth/Throat:     Pharynx: No oropharyngeal exudate.  Eyes:     General: No scleral icterus.    Conjunctiva/sclera: Conjunctivae normal.     Pupils: Pupils are equal, round, and reactive to light.  Cardiovascular:     Rate and Rhythm: Normal rate and regular rhythm.  Pulmonary:     Effort: Pulmonary effort is normal. No respiratory distress.     Breath sounds: No wheezing.  Abdominal:     General: There is no distension.     Palpations: Abdomen is soft.     Tenderness: There is no rebound.  Musculoskeletal:        General: No tenderness. Normal range of motion.     Cervical back: Normal range of motion and neck supple.  Lymphadenopathy:     Cervical: No  cervical adenopathy.  Skin:    General: Skin is warm and dry.     Coloration: Skin is not  pale.     Findings: No erythema or rash.  Neurological:     Mental Status: She is alert and oriented to person, place, and time.     Coordination: Coordination normal.  Psychiatric:        Attention and Perception: Attention and perception normal.        Mood and Affect: Affect normal. Mood is anxious.        Speech: Speech normal.        Behavior: Behavior normal.        Thought Content: Thought content normal.        Cognition and Memory: Cognition and memory normal.        Judgment: Judgment normal.    Skin with excoriated rash 07/12/2019:          Assessment & Plan:   HIV disease: Fill Biktarvy at [facility] and have it to her home.  We will have her come back in 1 month's time for labs and see me in 6 weeks.  Bedbugs: Needs someone to help exterminate these insects.  I am in the meantime giving her triamcinolone 0.5% to apply to the pruritic lesions.  History of pulmonary embolism on anticoagulation despite hemorrhagic infarct per neurology's recommendations.  History of CVA: On statin antihypertensive.  We will give her a flu shot today and and Prevnar 13.  She is going to see Dr. Stasia Cavalier tomorrow and Kinnie Scales will help with coordinating her care, visits and multiple medications.

## 2019-07-12 NOTE — Telephone Encounter (Signed)
Referral received from Dr. Tommy Medal 07/11/2018  Reviewed chart and noted a drastic increase in viral load. (30 in 02/2019 to 9,690 in 05/2019).  September 2020, patient was hospitalized and discharged to [facility] facility.  Fielding to see if we([facility]) can offer any assistance to ensure that the patient receives her medications. Spoke with Mikki Santee who stated the patient is no longer a resident at their facility.   Call placed to Ms. Katy Apo with assistance offered. Ms. Loise stated she has not been able to get all of her medications and would like some assistance. Previously she was in [facility] and then had in-home aide services after discharge. The in-home aide services discontinued their visits due to the patient's bedbug infestation. I would assume the infestation is in more than 1 room.    Ms. Teyana agreed to meet me today during her visit with Dr. Tommy Medal so we can reconcile her medications.   I offered to come to Ms. Arrington's home for continued medication management but I will organize her medications outside the home to decrease the risk of spreading the bedbugs. I would also like to offer suggestions and resources to decrease the bedbug infestation.

## 2019-07-12 NOTE — Telephone Encounter (Signed)
  Referral/Order received on 07/12/2019 by Dr. Tommy Medal to evaluate patient for [facility]).  Initial contact made on 07/12/2019 with Patient evaluated on 07/12/19  for [facility]. Patient was consented to care at this time.   Frequency / Duration of [facility] visits: Effective:07/12/2019 to 10/10/2019  1w3, 11mo2, 16mo1 4  PRN's for complications with disease process/progression, medication changes or concerns   [facility] will assess for learning needs related to diagnosis and treatment regimen, provide education as needed, fill pill box if needed, address any barriers which may be preventing medication compliance, and communicating with care team including physician and case manager.   Individualized Plan Of Care 07/12/2019 to 10/10/2019  a. Type of service(s) and care to be delivered: RN Case Management  b. Frequency and duration of service: Effective 07/12/2019 1w3, 69mo2, 71mo1 4prns for complications  with disease process/progression, medication changes or concerns . Visits/Contact may be conducted telephonically or in person to  best suit the patient.  c. Activity restrictions: Pt may be up as tolerated with the need for an assistive device (currently ambulates with a rolling walker)   d. Safety Measures: Standard Precautions/Infection Control   e. Service Objectives and Goals: Service Objectives are to assist the pt with HIV medication regimen adherence (which may include pillbox fills) and staying in care with the [facility] by identifying barriers to care. RN will address the barriers that are identified by the patient. Patient stated goals is assistance with getting all her medications and treatment for her bedbugs  f. Equipment required: No additional equipment needs at this time   g. Functional Limitations: Vision. Pt states she is legally blind  h. Rehabilitation potential: Guarded   i. Diet and Nutritional Needs: Regular Diet   j. Medications and  treatments: Medications have been reconciled and reviewed and are a part of EPIC electronic file   k. Specific therapies if needed: RN   l. Pertinent diagnoses: HIV disease, HTN, Aortic Thrombus, CVA, Hx PE, COPD, Lung CA, Asthma   m. Expected outcome: Guarded

## 2019-07-12 NOTE — Progress Notes (Addendum)
Focus: Initial visit, medication reconcilitation with pillbox refilled with available medications Patient Centered Goal: Get all of her medications and rid her home of bedbugs  Current Social Determinants or Barriers to care:  1. Social Support: Patient recently lost her sister who was part of her support system 2. Financial Strain: medical bills and home maintenance 3. Mental Health/Stress: Bedbug infestation 4. Housing Concerns: Infestation and the patient lives alone 5. Several comorbidites: HIV disease, HTN, Aortic Thrombus, CVA, Hx PE, COPD, Lung CA, Asthma   Patient does not have her cymbalta, requip, hydroxyzine, Biktarvy or zyprexa. Order has been placed for the [place] and steroid cream for insect bites(bedbugs). During today's visit pillbox filled with the medication that she does have available. Patient has an appt with her PCP tomorrow at 2pm so I have called and requested a refill for the other medications along with offering to fax a copy of her last discharge summary. Plan is to continue to see the patient to offer assistance with medication management and coordination of care.  RN reviewed Agency Services, [place], Home Fire Safety Assessment, Fall Risk Assessment and Suicide Risk Assessment was performed. RN also discussed information on a Living Will, Advanced Directives, and [place]. RN and Client/Designated Party educated/reviewed/signed Client Agreement and Consent for Service form along with Patient Rights and Responsibilities statement. RN developed patient specific and centered care plan. RN provided contact information and reviewed how to receive emergency help after hours for schedule changes, reporting of safety issues, falls, concerns or any needs/questions. Standard Precaution and Infection control along with interventions to correct or prevent high risk behaviors instructed to the patient. Client/Caregiver reports  understanding and agreement with the above

## 2019-07-12 NOTE — Telephone Encounter (Signed)
Called Dr Avbuere's office with a message left. Msg stated patient was seen in our office([facility])  today but during her medication reconciliation several medications are missing. Patient also informed us that she has not seen Dr Jeanie Cooks in about 5 yrs so I offered to send his office the most recent discharge summary which includes her current medications.

## 2019-07-14 NOTE — Telephone Encounter (Signed)
I acknowledge and approve this plan of care

## 2019-07-15 LAB — HIV-1 GENOTYPING (RTI,PI,IN INHBTR): HIV-1 Genotype: DETECTED — AB

## 2019-07-17 ENCOUNTER — Other Ambulatory Visit: Payer: Self-pay

## 2019-07-17 ENCOUNTER — Ambulatory Visit: Payer: Self-pay | Admitting: *Deleted

## 2019-07-17 DIAGNOSIS — W57XXXA Bitten or stung by nonvenomous insect and other nonvenomous arthropods, initial encounter: Secondary | ICD-10-CM

## 2019-07-17 DIAGNOSIS — B20 Human immunodeficiency virus [HIV] disease: Secondary | ICD-10-CM

## 2019-07-17 NOTE — Progress Notes (Signed)
Focus: Medication adherence and assistance with bedbugs Patient Centered Goal: Patient stated goals is assistance with getting all her medications and treatment for her bedbugs    Social Determinants: Financial Strain: patient stated she has spoke with an Conservation officer, nature and treatment for her bedbugs start at 1500.00 Housing Concerns:bedbugs and her inability to sleep at night   [employer] and donated mattress protectors, pillow protectors, bedbug foggers, XL plastic bags that seal for clothing, and bedbug spray. Traveled to the patient's home and delivered the supplies with education provided on the appropriate use of the supplies and how to manage bedbug infestation. Patient is appreciative and has support in the home to carry out the treatment.

## 2019-08-01 ENCOUNTER — Encounter (HOSPITAL_COMMUNITY): Payer: Self-pay | Admitting: Emergency Medicine

## 2019-08-01 ENCOUNTER — Other Ambulatory Visit: Payer: Self-pay

## 2019-08-01 ENCOUNTER — Emergency Department (HOSPITAL_COMMUNITY)
Admission: EM | Admit: 2019-08-01 | Discharge: 2019-08-01 | Disposition: A | Discharge status: Home / Self Care | Payer: Medicare Other | Attending: Emergency Medicine | Admitting: Emergency Medicine

## 2019-08-01 DIAGNOSIS — Z79899 Other long term (current) drug therapy: Secondary | ICD-10-CM | POA: Insufficient documentation

## 2019-08-01 DIAGNOSIS — Z760 Encounter for issue of repeat prescription: Secondary | ICD-10-CM | POA: Insufficient documentation

## 2019-08-01 DIAGNOSIS — J449 Chronic obstructive pulmonary disease, unspecified: Secondary | ICD-10-CM | POA: Diagnosis not present

## 2019-08-01 DIAGNOSIS — I129 Hypertensive chronic kidney disease with stage 1 through stage 4 chronic kidney disease, or unspecified chronic kidney disease: Secondary | ICD-10-CM | POA: Diagnosis not present

## 2019-08-01 DIAGNOSIS — Z21 Asymptomatic human immunodeficiency virus [HIV] infection status: Secondary | ICD-10-CM | POA: Diagnosis not present

## 2019-08-01 DIAGNOSIS — Z7901 Long term (current) use of anticoagulants: Secondary | ICD-10-CM | POA: Insufficient documentation

## 2019-08-01 DIAGNOSIS — F17210 Nicotine dependence, cigarettes, uncomplicated: Secondary | ICD-10-CM | POA: Insufficient documentation

## 2019-08-01 DIAGNOSIS — N183 Chronic kidney disease, stage 3 unspecified: Secondary | ICD-10-CM | POA: Diagnosis not present

## 2019-08-01 MED ORDER — TRIAMCINOLONE ACETONIDE 0.5 % EX CREA
TOPICAL_CREAM | Freq: Two times a day (BID) | CUTANEOUS | Status: DC
  Administered 2019-08-01: 1 via TOPICAL
  Filled 2019-08-01: qty 15

## 2019-08-01 MED ORDER — TRIAMCINOLONE ACETONIDE 0.5 % EX OINT: 1.0000 "application " | TOPICAL_OINTMENT | Freq: Two times a day (BID) | CUTANEOUS | 1 refills | Status: DC

## 2019-08-01 NOTE — ED Triage Notes (Signed)
Patient complaining of running out of her bed bug cream.

## 2019-08-01 NOTE — ED Provider Notes (Signed)
[facility] DEPT Provider Note   CSN: [id number] Arrival date & time: 08/01/19  0016     History Chief Complaint  Patient presents with  . Medication Refill    [name] is a 74 y.o. female.  The history is provided by the patient and medical records.  Medication Refill   74 year old female with history of asthma, COPD, hepatitis B, HIV, hyperlipidemia, history of non-small cell lung cancer, presenting to the ED requesting refill of her steroid cream.  Patient states she was recently living with her sister, however she just passed away few days ago.  Her home is unfortunately infested with bedbugs.  She has spoken with niece and nephew, however they are unwilling to exterminate the property due to high cost.  She has receive supplies from [facility] to help, however she continues having bedbug bites.  She was seen by her infectious disease physician, Dr. Tommy Medal, and given steroid cream but has since run out.  States while using it it did seem to help but she was not itching quite as bad.  She denies any fever, chills, chest pain, shortness of breath, nausea, or vomiting.  States if she could just stop itching she could rest and would probably feel better.  Past Medical History:  Diagnosis Date  . Asthma   . Atypical squamous cells of undetermined significance (ASCUS) on Papanicolaou smear of cervix   . Bed bug bite 07/12/2019  . COPD (chronic obstructive pulmonary disease) ([place])   . H/O drainage of abscess    left axilla, from left breast  . Hep B w/o coma   . HIV (human immunodeficiency virus infection) ([place])   . Hyperlipidemia   . Lung cancer ([place]) 12/14/13   LUL Adenocarcinoma  . Neuromuscular disorder ([place])    fingers/feet neuropathy  . Non-small cell lung cancer ([place])   . S/P radiation therapy 01/26/14-02/02/14   sbrt  lt upper lung-54Gy/30fx    Patient Active Problem List   Diagnosis Date Noted  . Bed bug bite 07/12/2019  .  DNR (do not resuscitate)   . Altered mental status   . Acute pulmonary embolism without acute cor pulmonale (HCC)   . Acute ischemic right MCA stroke ([place])   . Palliative care encounter   . Aortic embolism or thrombosis ([place])   . CVA (cerebral vascular accident) ([place]) 03/02/2019  . COPD (chronic obstructive pulmonary disease) ([place]) 03/01/2019  . PE (pulmonary thromboembolism) ([place]) 03/01/2019  . Tobacco abuse 03/01/2019  . 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[place]) 03/01/2019  . CKD (chronic kidney disease), stage III 03/01/2019  . Generalized weakness 03/01/2019  . Fall at home   . 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 Splenic infarct   . Acute and chronic respiratory failure (acute-on-chronic) ([place]) 02/11/2019  . CKD (chronic kidney disease) stage 3, GFR 30-59 ml/min 08/12/2016  . Lung nodule 10/12/2015  . Bronchitis 09/09/2015  . History of lung cancer 09/08/2015  . Thoracic aorta atherosclerosis ([place]) 09/08/2015  . Chronic obstructive pulmonary disease with (acute) exacerbation ([place]) 09/08/2015  . Sepsis ([place]) 03/28/2015  . CAP (community acquired pneumonia) 03/28/2015  . UTI (lower urinary tract infection) 03/28/2015  . Respiratory failure ([place]) 03/28/2015  . Non-small cell lung cancer ([place])   . Malignant neoplasm of upper lobe, bronchus or lung 01/03/2014  . Acute hypoxemic respiratory failure ([place]) 10/31/2013  . COPD with acute exacerbation ([place]) 10/30/2013  . COPD exacerbation ([place]) 10/30/2013  . Other and unspecified noninfectious gastroenteritis and colitis(558.9) 11/20/2012  .  Rectal bleed 11/19/2012  . Environmental allergies 11/02/2012  . Obesity 05/05/2012  . Renal insufficiency 05/05/2012  . Hyperglycemia 05/05/2012  . Asthma 12/10/2011  . Cigarette smoker 12/10/2011  . Hypercholesteremia 12/11/2010  . Hypertension 12/11/2010  . ALLERGIC RHINITIS, SEASONAL 07/03/2010  . COPD 07/03/2010  . HIV (human immunodeficiency virus infection) ([place]) 12/09/2006  . DEPRESSION 12/09/2006  . BREAST MASS,  BENIGN 12/09/2006  . OSTEOPOROSIS 12/09/2006    Past Surgical History:  Procedure Laterality Date  . ABCESS DRAINAGE     abcess under Left arm, abcess from L breast  . BUBBLE STUDY  03/08/2019   Procedure: BUBBLE STUDY;  Surgeon: [name], MD;  Location: [facility] ENDOSCOPY;  Service: Cardiovascular;;  . removal of abnormal cells on uterus    . TEE WITHOUT CARDIOVERSION N/A 03/08/2019   Procedure: TRANSESOPHAGEAL ECHOCARDIOGRAM (TEE);  Surgeon: [name], MD;  Location: [facility] ENDOSCOPY;  Service: Cardiovascular;  Laterality: N/A;  . thyroid gland removed       OB History   No obstetric history on file.     Family History  Problem Relation Age of Onset  . Pneumonia Mother   . Hypertension Sister   . Diabetes Sister   . Cancer Sister        pancreatic and lung  . Cancer Brother        pancreatic  . Cancer Brother        pancreatic    Social History   Tobacco Use  . Smoking status: Current Every Day Smoker    Packs/day: 1.00    Years: 40.00    Pack years: 40.00    Types: Cigarettes  . Smokeless tobacco: Never Used  . Tobacco comment: quit date the day she starts her radiation at the [facility]  Substance Use Topics  . Alcohol use: No    Alcohol/week: 0.0 standard drinks    Comment: none in 20 years  . Drug use: No    Comment: none in 20 years    Home Medications Prior to Admission medications   Medication Sig Start Date End Date Taking? Authorizing Provider  albuterol (PROVENTIL HFA;VENTOLIN HFA) 108 (90 BASE) MCG/ACT inhaler Inhale 1 puff into the lungs every 6 (six) hours as needed for shortness of breath.     [provider]  albuterol (PROVENTIL) (2.5 MG/3ML) 0.083% nebulizer solution Take 3 mLs (2.5 mg total) by nebulization every 4 (four) hours as needed for wheezing or shortness of breath. 01/18/19   Jaynee Eagles, PA-C  apixaban (ELIQUIS) 5 MG TABS tablet Take 1 tablet (5 mg total) by mouth 2 (two) times daily. 03/13/19   Hosie Poisson, MD   Ascorbic Acid (VITAMIN C) 100 MG tablet Take 100 mg by mouth daily.    [provider]  B Complex-Biotin-FA (VITAMIN B50 COMPLEX PO) Take 1 tablet by mouth daily.    [provider]  bictegravir-emtricitabine-tenofovir AF (BIKTARVY) 50-200-25 MG TABS tablet Take 1 tablet by mouth daily. 07/12/19   Truman Hayward, MD  DULoxetine (CYMBALTA) 20 MG capsule Take 1 capsule (20 mg total) by mouth daily. 03/10/19   Hosie Poisson, MD  Fluticasone-Salmeterol (ADVAIR) 500-50 MCG/DOSE AEPB Inhale 1 puff into the lungs every 12 (twelve) hours.    [provider]  gabapentin (NEURONTIN) 600 MG tablet Take 1 tablet (600 mg total) by mouth 3 (three) times daily. 07/02/19   Raylene Everts, MD  hydrOXYzine (ATARAX/VISTARIL) 25 MG tablet Take 1 tablet (25 mg total) by mouth every 6 (six)  hours. 07/02/19   Raylene Everts, MD  loratadine (CLARITIN) 10 MG tablet Take 10 mg by mouth 2 (two) times daily.     [provider]  montelukast (SINGULAIR) 10 MG tablet Take 1 tablet (10 mg total) by mouth at bedtime. 12/02/12   Michel Bickers, MD  nicotine (NICODERM CQ - DOSED IN MG/24 HOURS) 21 mg/24hr patch Place 1 patch (21 mg total) onto the skin daily. Patient not taking: Reported on 07/12/2019 03/10/19   Hosie Poisson, MD  OLANZapine (ZYPREXA) 2.5 MG tablet Take 1 tablet (2.5 mg total) by mouth at bedtime. 03/10/19   Hosie Poisson, MD  omega-3 acid ethyl esters (LOVAZA) 1 g capsule Take 1 g by mouth daily.    [provider]  polyethylene glycol (MIRALAX / GLYCOLAX) 17 g packet Take 17 g by mouth daily. 03/10/19   Hosie Poisson, MD  rOPINIRole (REQUIP) 1 MG tablet Take 1 tablet (1 mg total) by mouth at bedtime. 05/20/19   Ward, Kristen N, DO  SPIRIVA RESPIMAT 2.5 MCG/ACT AERS INHALE 2 PUFFS INTO THE LUNGS DAILY Patient taking differently: Inhale 2 puffs into the lungs daily.  09/24/14   Rigoberto Noel, MD  triamcinolone ointment (KENALOG) 0.5 % Apply 1 application topically 2  (two) times daily. 07/12/19   Truman Hayward, MD  VITAMIN A PO Take 1 tablet by mouth daily.    [provider]    Allergies    Patient has no known allergies.  Review of Systems   Review of Systems  Skin:       Bed bugs  All other systems reviewed and are negative.   Physical Exam Updated Vital Signs BP (!) 147/84 (BP Location: Right Arm)   Pulse 85   Temp 98.4 F (36.9 C) (Oral)   Resp 15   Ht 5\' 9"  (1.753 m)   Wt 83.9 kg   SpO2 97%   BMI 27.32 kg/m   Physical Exam Vitals and nursing note reviewed.  Constitutional:      Appearance: She is well-developed.  HENT:     Head: Normocephalic and atraumatic.  Eyes:     Conjunctiva/sclera: Conjunctivae normal.     Pupils: Pupils are equal, round, and reactive to light.  Cardiovascular:     Rate and Rhythm: Normal rate and regular rhythm.     Heart sounds: Normal heart sounds.  Pulmonary:     Effort: Pulmonary effort is normal. No respiratory distress.     Breath sounds: Normal breath sounds. No stridor.  Abdominal:     General: Bowel sounds are normal.     Palpations: Abdomen is soft.     Tenderness: There is no abdominal tenderness. There is no rebound.  Musculoskeletal:        General: Normal range of motion.     Cervical back: Normal range of motion.  Skin:    General: Skin is warm and dry.  Neurological:     Mental Status: She is alert and oriented to person, place, and time.     ED Results / Procedures / Treatments   Labs (all labs ordered are listed, but only abnormal results are displayed) Labs Reviewed - No data to display  EKG None  Radiology No results found.  Procedures Procedures (including critical care time)  Medications Ordered in ED Medications  triamcinolone cream (KENALOG) 0.5 % (1 application Topical Given 08/01/19 0052)    ED Course  I have reviewed the triage vital signs and the nursing notes.  Pertinent labs & imaging results that were available during my care of  the patient were reviewed by me and considered in my medical decision making (see chart for details).    MDM Rules/Calculators/A&P  74 year old female here requesting refill of her Kenalog cream.  Has known bedbug infestation at home, working to try to get exterminator out but this is very expensive.  She reports continued itching, was improved with the Kenalog cream that was initially given to her by her infectious disease physician.  She ran out of this 2 days ago.  She has multiple areas of excoriation across her body, notably on the hands, lower legs, and chest along with upper back.  No signs of superimposed infection or cellulitis.  She is hemodynamically stable and nontoxic in appearance.  She was given to both Kenalog cream here along with refills for home.  She will continue working with family to help get home exterminated.  Follow-up with PCP.  She may return here for any new or acute changes.  Final Clinical Impression(s) / ED Diagnoses Final diagnoses:  Medication refill    Rx / DC Orders ED Discharge Orders         Ordered    triamcinolone ointment (KENALOG) 0.5 %  2 times daily     08/01/19 0103           Larene Pickett, PA-C 08/01/19 0211    Merryl Hacker, MD 08/01/19 [phone]

## 2019-08-01 NOTE — Discharge Instructions (Signed)
Continue using kenalog cream, I have given you extra refills. Keep working to get the home exterminated, this is the only way they will fully go away. Follow-up with your primary care doctor. Return here for any new/acute changes.

## 2019-08-07 MED FILL — BIKTARVY 50-200-25 MG TABS: 50-200-25 | 30 days supply | Qty: 30 | Fill #1

## 2019-08-08 MED FILL — TRIAMCINOLONE 0.5% OINTMENT: 0.5 | 5 days supply | Qty: 30 | Fill #1

## 2019-08-09 ENCOUNTER — Other Ambulatory Visit: Payer: Medicare Other

## 2019-08-16 ENCOUNTER — Telehealth: Payer: Self-pay

## 2019-08-16 DIAGNOSIS — W57XXXA Bitten or stung by nonvenomous insect and other nonvenomous arthropods, initial encounter: Secondary | ICD-10-CM

## 2019-08-16 MED ORDER — PREDNISONE 20 MG PO TABS: 40.0000 mg | ORAL_TABLET | Freq: Every day | ORAL | 0 refills | Status: AC

## 2019-08-16 NOTE — Telephone Encounter (Signed)
Call patient and made aware of new Rx sent to pharmacy. Also advised that Dr. Tommy Medal also recommends otc benadryl which patient states she has tried but has had little to no resolution. Patient will try prednisone. Eugenia Mcalpine

## 2019-08-16 NOTE — Addendum Note (Signed)
Addended by: Eugenia Mcalpine on: 08/16/2019 05:06 PM   Modules accepted: Orders

## 2019-08-16 NOTE — Telephone Encounter (Signed)
Patient can try prednisone 40mg  daily for 7 days  They can also try otc benadryl 25-50mg  every 8 hours for itching but not go driving after taking doses

## 2019-08-16 NOTE — Telephone Encounter (Signed)
Received voicemail in triage from patient stating she continues to itching. Patient received refill yesterday for kenalog cream, but patient is request any type of oral relief. Patient also states in voicemail that she has removed herself from beg bug infested home. Routing to provider for advise. Eugenia Mcalpine

## 2019-08-23 ENCOUNTER — Encounter: Payer: Medicare Other | Admitting: Infectious Disease

## 2019-08-31 ENCOUNTER — Telehealth: Payer: Self-pay

## 2019-08-31 MED FILL — BIKTARVY 50-200-25 MG TABS: 50-200-25 | 30 days supply | Qty: 30 | Fill #2

## 2019-08-31 NOTE — Telephone Encounter (Signed)
Patient called triage requesting advice for bug bites. Patient states that she was able to "remove the poop on her skin and see the bug bites". Believes that she "brought the bugs with her when she moved because they were under her skin". Patient reports that she no longer lives in the home previously treated for bedbugs. Referred patient to her primary care doctor and told her if they feel it is an infectious disease problem that they'll refer her to [name] for assessment. Patient verbalized understanding.   Marquise Wicke Lorita Officer, RN

## 2019-09-06 ENCOUNTER — Encounter: Payer: Self-pay | Admitting: Infectious Disease

## 2019-09-06 NOTE — [place] Notes (Signed)
Patient ID: [name], female   DOB: June 24, 1946, 74 y.o.   MRN: [id number] Working Viral Load List called patient to reschedule her missed appointment with Dr Tommy Medal

## 2019-09-15 ENCOUNTER — Telehealth: Payer: Self-pay | Admitting: *Deleted

## 2019-09-15 NOTE — Telephone Encounter (Signed)
At [facility], the patient filled 07/12/2019, 08/07/2019 and 08/31/2019- she picked up the first and [employer] mail the last two refills. [address] [place], [place] [address] [phone]  [place] contact information [phone]

## 2019-09-15 NOTE — Telephone Encounter (Signed)
Thanks maybe  Ambre might be able to help along w frequent visits. Does she really have bed bug problem she brought with her and also have we given her any topical steroids for this or even prednisone. Tough one

## 2019-09-15 NOTE — Telephone Encounter (Signed)
Delmar Landau, FNP from [employer] calling with update and for advice. Patient has moved from home to a hotel and to a new home, bringing bedbug infestation with her. She is not taking Biktarvy, although she does have it at the home.  She is scheduled to see a new PCP in office on 3/25 (Dr Nolene Ebbs).  Davy Pique states that patient has been noncompliant with all advice and direction they have given her. They are escalating her care management, will seek assisted living placement for her.  Davy Pique is placing referral via [facility] work. Davy Pique would like to speak with [facility] pharmacy to see patient's fill pattern for Biktarvy, also to confirm mailing address. Landis Gandy, RN

## 2019-09-26 ENCOUNTER — Other Ambulatory Visit: Payer: Self-pay | Admitting: Internal Medicine

## 2019-09-26 DIAGNOSIS — E2839 Other primary ovarian failure: Secondary | ICD-10-CM

## 2019-09-27 MED FILL — BIKTARVY 50-200-25 MG TABS: 50-200-25 | 30 days supply | Qty: 30 | Fill #3

## 2019-09-28 ENCOUNTER — Telehealth: Payer: Self-pay | Admitting: *Deleted

## 2019-09-28 NOTE — Telephone Encounter (Signed)
RN contacted the patient today. Purpose of the call is to stay connected with the patient. RN left a message stating that I wanted to be sure all is well and to please let me know if I can assist in any way.  

## 2019-09-28 NOTE — Telephone Encounter (Addendum)
Return call received from [name] thanking me for reaching out to her. She stated the mattress covers, pillow covers and bedbug spray were effective and she is happy to report that her rash has resolved. At this time she declines any further assistance and states she has my number and will save it. She has family and friends that come over to assist her. During my last visit with her her friend was present and working on removing the clutter the home. At the time I will be discharging [name] with hopes that she will contact me and request assistance with her elevated viral load

## 2019-10-03 ENCOUNTER — Other Ambulatory Visit: Payer: Self-pay

## 2019-10-03 ENCOUNTER — Ambulatory Visit: Payer: Medicare Other | Admitting: Infectious Disease

## 2019-10-23 MED FILL — BIKTARVY 50-200-25 MG TABS: 50-200-25 | 30 days supply | Qty: 30 | Fill #4

## 2019-11-18 ENCOUNTER — Emergency Department (HOSPITAL_COMMUNITY): Payer: Medicare Other

## 2019-11-18 ENCOUNTER — Emergency Department (HOSPITAL_COMMUNITY)
Admission: EM | Admit: 2019-11-18 | Discharge: 2019-11-19 | Disposition: A | Discharge status: Home / Self Care | Payer: Medicare Other | Source: Home / Self Care | Attending: Emergency Medicine | Admitting: Emergency Medicine

## 2019-11-18 DIAGNOSIS — M25552 Pain in left hip: Secondary | ICD-10-CM

## 2019-11-18 DIAGNOSIS — D496 Neoplasm of unspecified behavior of brain: Secondary | ICD-10-CM | POA: Diagnosis not present

## 2019-11-18 DIAGNOSIS — W19XXXA Unspecified fall, initial encounter: Secondary | ICD-10-CM

## 2019-11-18 DIAGNOSIS — J449 Chronic obstructive pulmonary disease, unspecified: Secondary | ICD-10-CM | POA: Insufficient documentation

## 2019-11-18 DIAGNOSIS — W010XXA Fall on same level from slipping, tripping and stumbling without subsequent striking against object, initial encounter: Secondary | ICD-10-CM | POA: Insufficient documentation

## 2019-11-18 DIAGNOSIS — Y9301 Activity, walking, marching and hiking: Secondary | ICD-10-CM | POA: Insufficient documentation

## 2019-11-18 DIAGNOSIS — F17210 Nicotine dependence, cigarettes, uncomplicated: Secondary | ICD-10-CM | POA: Insufficient documentation

## 2019-11-18 DIAGNOSIS — C7931 Secondary malignant neoplasm of brain: Secondary | ICD-10-CM | POA: Diagnosis not present

## 2019-11-18 DIAGNOSIS — Z21 Asymptomatic human immunodeficiency virus [HIV] infection status: Secondary | ICD-10-CM | POA: Insufficient documentation

## 2019-11-18 DIAGNOSIS — Y92009 Unspecified place in unspecified non-institutional (private) residence as the place of occurrence of the external cause: Secondary | ICD-10-CM | POA: Insufficient documentation

## 2019-11-18 DIAGNOSIS — Y999 Unspecified external cause status: Secondary | ICD-10-CM | POA: Insufficient documentation

## 2019-11-18 MED ORDER — HYDROCODONE-ACETAMINOPHEN 5-325 MG PO TABS
1.0000 | ORAL_TABLET | Freq: Once | ORAL | Status: AC
  Administered 2019-11-18: 1 via ORAL
  Filled 2019-11-18: qty 1

## 2019-11-18 NOTE — ED Provider Notes (Signed)
Patient signed out to me at shift change.  Patient had a ground-level fall, thought to have been mechanical in nature.  Plain films have been negative.  Signed out to me pending ambulation test.  If patient ambulates, she can be discharged.  If unable to ambulate, will check CT for occult fracture.  12:05 AM Patient ambulates with walker.  She uses a rollator at home.   Montine Circle, PA-C 11/19/19 0005    Maudie Flakes, MD 11/24/19 [phone]

## 2019-11-18 NOTE — ED Provider Notes (Signed)
[facility] Emergency Department Provider Note MRN:  [id number]  Arrival date & time: 11/18/19     Chief Complaint   Hip Pain and Fall   History of Present Illness   [name] is a 74 y.o. year-old female with a history of COPD, HIV, lung cancer presenting to the ED with chief complaint of fall.  Patient has a walker that she sits and uses to get around the house.  She set the walker in the hallway to use the restroom and when she came out she thinks that the walker was not in park and it slid out from under her and she fell on her bottom.  She was unable to get off of the floor on her own.  She is endorsing left hip pain and lower back pain.  She has some decreased sensation and weakness to her left side from a recent stroke which is unchanged.  She denies head trauma, no loss of consciousness, no chest pain or shortness of breath.  Pain is constant, worse with motion or palpation.  Review of Systems  A complete 10 system review of systems was obtained and all systems are negative except as noted in the HPI and PMH.   Patient's Health History    Past Medical History:  Diagnosis Date  . Asthma   . Atypical squamous cells of undetermined significance (ASCUS) on Papanicolaou smear of cervix   . Bed bug bite 07/12/2019  . COPD (chronic obstructive pulmonary disease) ([place])   . H/O drainage of abscess    left axilla, from left breast  . Hep B w/o coma   . HIV (human immunodeficiency virus infection) ([place])   . Hyperlipidemia   . Lung cancer ([place]) 12/14/13   LUL Adenocarcinoma  . Neuromuscular disorder ([place])    fingers/feet neuropathy  . Non-small cell lung cancer ([place])   . S/P radiation therapy 01/26/14-02/02/14   sbrt  lt upper lung-54Gy/25fx    Past Surgical History:  Procedure Laterality Date  . ABCESS DRAINAGE     abcess under Left arm, abcess from L breast  . BUBBLE STUDY  03/08/2019   Procedure: BUBBLE STUDY;  Surgeon: [name], MD;   Location: [facility] ENDOSCOPY;  Service: Cardiovascular;;  . removal of abnormal cells on uterus    . TEE WITHOUT CARDIOVERSION N/A 03/08/2019   Procedure: TRANSESOPHAGEAL ECHOCARDIOGRAM (TEE);  Surgeon: [name], MD;  Location: [facility] ENDOSCOPY;  Service: Cardiovascular;  Laterality: N/A;  . thyroid gland removed      Family History  Problem Relation Age of Onset  . Pneumonia Mother   . Hypertension Sister   . Diabetes Sister   . Cancer Sister        pancreatic and lung  . Cancer Brother        pancreatic  . Cancer Brother        pancreatic    Social History   Socioeconomic History  . Marital status: Single    Spouse name: Not on file  . Number of children: Not on file  . Years of education: Not on file  . Highest education level: Not on file  Occupational History  . Not on file  Tobacco Use  . Smoking status: Current Every Day Smoker    Packs/day: 1.00    Years: 40.00    Pack years: 40.00    Types: Cigarettes  . Smokeless tobacco: Never Used  . Tobacco comment: quit date [facility]  Substance and Sexual Activity  . Alcohol use: No    Alcohol/week: 0.0 standard drinks    Comment: none in 20 years  . Drug use: No    Comment: none in 20 years  . Sexual activity: Not Currently    Partners: Male    Comment: given condoms  Other Topics Concern  . Not on file  Social History Narrative  . Not on file   Social Determinants of Health   Financial Resource Strain:   . Difficulty of Paying Living Expenses:   Food Insecurity:   . Worried About Charity fundraiser in the Last Year:   . Arboriculturist in the Last Year:   Transportation Needs:   . Film/video editor (Medical):   Marland Kitchen Lack of Transportation (Non-Medical):   Physical Activity:   . Days of Exercise per Week:   . Minutes of Exercise per Session:   Stress:   . Feeling of Stress :   Social Connections:   . Frequency of Communication with Friends and Family:   .  Frequency of Social Gatherings with Friends and Family:   . Attends Religious Services:   . Active Member of Clubs or Organizations:   . Attends Archivist Meetings:   Marland Kitchen Marital Status:   Intimate Partner Violence:   . Fear of Current or Ex-Partner:   . Emotionally Abused:   Marland Kitchen Physically Abused:   . Sexually Abused:      Physical Exam   Vitals:   11/18/19 2019 11/18/19 2026  BP: 123/67   Pulse: 79   Resp: 16   Temp: 97.8 F (36.6 C)   SpO2: 90% 94%    CONSTITUTIONAL: Well-appearing, NAD NEURO:  Alert and oriented x 3, no focal deficits EYES:  eyes equal and reactive ENT/NECK:  no LAD, no JVD CARDIO: Regular rate, well-perfused, normal S1 and S2 PULM:  CTAB no wheezing or rhonchi GI/GU:  normal bowel sounds, non-distended, non-tender MSK/SPINE:  No gross deformities, no edema, pain elicited with range of motion of the left hip, tenderness palpation to the lumbar spine SKIN:  no rash, atraumatic PSYCH:  Appropriate speech and behavior  *Additional and/or pertinent findings included in MDM below  Diagnostic and Interventional Summary    EKG Interpretation  Date/Time:    Ventricular Rate:    PR Interval:    QRS Duration:   QT Interval:    QTC Calculation:   R Axis:     Text Interpretation:        Labs Reviewed  CBC  BASIC METABOLIC PANEL    DG Lumbar Spine Complete  Final Result    DG Pelvis 1-2 Views  Final Result      Medications  HYDROcodone-acetaminophen (NORCO/VICODIN) 5-325 MG per tablet 1 tablet (has no administration in time range)     Procedures  /  Critical Care Procedures  ED Course and Medical Decision Making  I have reviewed the triage vital signs, the nursing notes, and pertinent available records from the EMR.  Listed above are laboratory and imaging tests that I personally ordered, reviewed, and interpreted and then considered in my medical decision making (see below for details).      Mechanical fall, x-rays to exclude  fracture.  X-rays are reassuring.  Patient was asked to stand and walk a few steps, but she explains that her hip still hurts and she has not received any pain medicine.  Will provide Norco tablet and attempt ambulation  thereafter.  If unable to bear weight, would obtain CT imaging to exclude occult fracture.  Signed out to oncoming provider at shift change.  Barth Kirks. Sedonia Small, [place] [email or web address]  Final Clinical Impressions(s) / ED Diagnoses     ICD-10-CM   1. Fall, initial encounter  W19.XXXA   2. Left hip pain  M25.552     ED Discharge Orders    None       Discharge Instructions Discussed with and Provided to Patient:   Discharge Instructions   None       Maudie Flakes, MD 11/18/19 2207

## 2019-11-18 NOTE — ED Triage Notes (Signed)
Pt comes to ed via ems, c/o mechanical fall this evening at 6:15 pm, in the pts home bathroom. Denies loc, blood thinners, complain of pelvic pain with fall to the left side. Pain reported as 8 out 10.  Pt utilizes a walker, alert x 4. Medical hx of stroke this past fall, pt has left side weakness at her baseline from this.  V/s bp 117/63, hr 84, rr18, spo2 93 at 2 liters. cbg 155. Family on the way. Pt is legally blind in both eyes.

## 2019-11-19 ENCOUNTER — Other Ambulatory Visit: Payer: Self-pay

## 2019-11-19 NOTE — Discharge Instructions (Addendum)
No fractures were seen on your x-rays today.  Continue using your walker at home.  Do so carefully.  If your symptoms change or worsen, please return to the ER.

## 2019-11-21 ENCOUNTER — Emergency Department (HOSPITAL_COMMUNITY): Payer: Medicare Other

## 2019-11-21 ENCOUNTER — Inpatient Hospital Stay (HOSPITAL_COMMUNITY)
Admission: EM | Admit: 2019-11-21 | Discharge: 2019-12-08 | DRG: 025 | Disposition: A | Discharge status: Skilled Nursing Facility | Payer: Medicare Other | Attending: Neurological Surgery | Admitting: Neurological Surgery

## 2019-11-21 ENCOUNTER — Other Ambulatory Visit: Payer: Self-pay

## 2019-11-21 DIAGNOSIS — E876 Hypokalemia: Secondary | ICD-10-CM | POA: Diagnosis not present

## 2019-11-21 DIAGNOSIS — G936 Cerebral edema: Secondary | ICD-10-CM | POA: Diagnosis present

## 2019-11-21 DIAGNOSIS — J9621 Acute and chronic respiratory failure with hypoxia: Secondary | ICD-10-CM | POA: Diagnosis present

## 2019-11-21 DIAGNOSIS — M25552 Pain in left hip: Secondary | ICD-10-CM | POA: Diagnosis present

## 2019-11-21 DIAGNOSIS — Z20822 Contact with and (suspected) exposure to covid-19: Secondary | ICD-10-CM | POA: Diagnosis present

## 2019-11-21 DIAGNOSIS — R5381 Other malaise: Secondary | ICD-10-CM | POA: Diagnosis present

## 2019-11-21 DIAGNOSIS — W1830XA Fall on same level, unspecified, initial encounter: Secondary | ICD-10-CM | POA: Diagnosis present

## 2019-11-21 DIAGNOSIS — N280 Ischemia and infarction of kidney: Secondary | ICD-10-CM | POA: Diagnosis present

## 2019-11-21 DIAGNOSIS — N183 Chronic kidney disease, stage 3 unspecified: Secondary | ICD-10-CM | POA: Diagnosis not present

## 2019-11-21 DIAGNOSIS — G9340 Encephalopathy, unspecified: Secondary | ICD-10-CM | POA: Diagnosis not present

## 2019-11-21 DIAGNOSIS — I7410 Embolism and thrombosis of unspecified parts of aorta: Secondary | ICD-10-CM | POA: Diagnosis not present

## 2019-11-21 DIAGNOSIS — R404 Transient alteration of awareness: Secondary | ICD-10-CM

## 2019-11-21 DIAGNOSIS — Z7901 Long term (current) use of anticoagulants: Secondary | ICD-10-CM

## 2019-11-21 DIAGNOSIS — R55 Syncope and collapse: Secondary | ICD-10-CM | POA: Diagnosis not present

## 2019-11-21 DIAGNOSIS — D735 Infarction of spleen: Secondary | ICD-10-CM | POA: Diagnosis present

## 2019-11-21 DIAGNOSIS — G4733 Obstructive sleep apnea (adult) (pediatric): Secondary | ICD-10-CM | POA: Diagnosis not present

## 2019-11-21 DIAGNOSIS — Z79899 Other long term (current) drug therapy: Secondary | ICD-10-CM

## 2019-11-21 DIAGNOSIS — N179 Acute kidney failure, unspecified: Secondary | ICD-10-CM | POA: Diagnosis present

## 2019-11-21 DIAGNOSIS — Z7951 Long term (current) use of inhaled steroids: Secondary | ICD-10-CM

## 2019-11-21 DIAGNOSIS — B20 Human immunodeficiency virus [HIV] disease: Secondary | ICD-10-CM

## 2019-11-21 DIAGNOSIS — Z800 Family history of malignant neoplasm of digestive organs: Secondary | ICD-10-CM

## 2019-11-21 DIAGNOSIS — G9341 Metabolic encephalopathy: Secondary | ICD-10-CM | POA: Diagnosis present

## 2019-11-21 DIAGNOSIS — I6930 Unspecified sequelae of cerebral infarction: Secondary | ICD-10-CM

## 2019-11-21 DIAGNOSIS — M545 Low back pain: Secondary | ICD-10-CM | POA: Diagnosis present

## 2019-11-21 DIAGNOSIS — B852 Pediculosis, unspecified: Secondary | ICD-10-CM | POA: Diagnosis present

## 2019-11-21 DIAGNOSIS — R32 Unspecified urinary incontinence: Secondary | ICD-10-CM | POA: Diagnosis present

## 2019-11-21 DIAGNOSIS — F22 Delusional disorders: Secondary | ICD-10-CM | POA: Diagnosis not present

## 2019-11-21 DIAGNOSIS — N189 Chronic kidney disease, unspecified: Secondary | ICD-10-CM | POA: Diagnosis not present

## 2019-11-21 DIAGNOSIS — J9602 Acute respiratory failure with hypercapnia: Secondary | ICD-10-CM | POA: Diagnosis present

## 2019-11-21 DIAGNOSIS — D496 Neoplasm of unspecified behavior of brain: Secondary | ICD-10-CM | POA: Diagnosis present

## 2019-11-21 DIAGNOSIS — R0902 Hypoxemia: Secondary | ICD-10-CM | POA: Diagnosis not present

## 2019-11-21 DIAGNOSIS — G629 Polyneuropathy, unspecified: Secondary | ICD-10-CM | POA: Diagnosis present

## 2019-11-21 DIAGNOSIS — E860 Dehydration: Secondary | ICD-10-CM | POA: Diagnosis present

## 2019-11-21 DIAGNOSIS — R4189 Other symptoms and signs involving cognitive functions and awareness: Secondary | ICD-10-CM | POA: Diagnosis present

## 2019-11-21 DIAGNOSIS — W57XXXA Bitten or stung by nonvenomous insect and other nonvenomous arthropods, initial encounter: Secondary | ICD-10-CM | POA: Diagnosis present

## 2019-11-21 DIAGNOSIS — G9389 Other specified disorders of brain: Secondary | ICD-10-CM | POA: Diagnosis not present

## 2019-11-21 DIAGNOSIS — C7931 Secondary malignant neoplasm of brain: Principal | ICD-10-CM | POA: Diagnosis present

## 2019-11-21 DIAGNOSIS — Z8249 Family history of ischemic heart disease and other diseases of the circulatory system: Secondary | ICD-10-CM | POA: Diagnosis not present

## 2019-11-21 DIAGNOSIS — J9611 Chronic respiratory failure with hypoxia: Secondary | ICD-10-CM | POA: Diagnosis not present

## 2019-11-21 DIAGNOSIS — I129 Hypertensive chronic kidney disease with stage 1 through stage 4 chronic kidney disease, or unspecified chronic kidney disease: Secondary | ICD-10-CM | POA: Diagnosis present

## 2019-11-21 DIAGNOSIS — Z720 Tobacco use: Secondary | ICD-10-CM | POA: Diagnosis not present

## 2019-11-21 DIAGNOSIS — K5900 Constipation, unspecified: Secondary | ICD-10-CM | POA: Diagnosis not present

## 2019-11-21 DIAGNOSIS — G939 Disorder of brain, unspecified: Secondary | ICD-10-CM | POA: Diagnosis not present

## 2019-11-21 DIAGNOSIS — Z21 Asymptomatic human immunodeficiency virus [HIV] infection status: Secondary | ICD-10-CM | POA: Diagnosis not present

## 2019-11-21 DIAGNOSIS — F17210 Nicotine dependence, cigarettes, uncomplicated: Secondary | ICD-10-CM | POA: Diagnosis present

## 2019-11-21 DIAGNOSIS — I959 Hypotension, unspecified: Secondary | ICD-10-CM | POA: Diagnosis present

## 2019-11-21 DIAGNOSIS — R4182 Altered mental status, unspecified: Secondary | ICD-10-CM | POA: Diagnosis present

## 2019-11-21 DIAGNOSIS — J449 Chronic obstructive pulmonary disease, unspecified: Secondary | ICD-10-CM | POA: Diagnosis present

## 2019-11-21 DIAGNOSIS — B1910 Unspecified viral hepatitis B without hepatic coma: Secondary | ICD-10-CM | POA: Diagnosis present

## 2019-11-21 DIAGNOSIS — E785 Hyperlipidemia, unspecified: Secondary | ICD-10-CM

## 2019-11-21 DIAGNOSIS — Z833 Family history of diabetes mellitus: Secondary | ICD-10-CM

## 2019-11-21 DIAGNOSIS — Z86711 Personal history of pulmonary embolism: Secondary | ICD-10-CM

## 2019-11-21 DIAGNOSIS — Z923 Personal history of irradiation: Secondary | ICD-10-CM

## 2019-11-21 DIAGNOSIS — I69354 Hemiplegia and hemiparesis following cerebral infarction affecting left non-dominant side: Secondary | ICD-10-CM

## 2019-11-21 DIAGNOSIS — R414 Neurologic neglect syndrome: Secondary | ICD-10-CM | POA: Diagnosis present

## 2019-11-21 DIAGNOSIS — Z85118 Personal history of other malignant neoplasm of bronchus and lung: Secondary | ICD-10-CM | POA: Diagnosis not present

## 2019-11-21 DIAGNOSIS — Y92012 Bathroom of single-family (private) house as the place of occurrence of the external cause: Secondary | ICD-10-CM | POA: Diagnosis not present

## 2019-11-21 DIAGNOSIS — Z9981 Dependence on supplemental oxygen: Secondary | ICD-10-CM

## 2019-11-21 DIAGNOSIS — F329 Major depressive disorder, single episode, unspecified: Secondary | ICD-10-CM | POA: Diagnosis present

## 2019-11-21 DIAGNOSIS — I10 Essential (primary) hypertension: Secondary | ICD-10-CM | POA: Diagnosis not present

## 2019-11-21 LAB — POCT I-STAT 7, (LYTES, BLD GAS, ICA,H+H)
Acid-base deficit: 1 mmol/L (ref 0.0–2.0)
Acid-base deficit: 1 mmol/L (ref 0.0–2.0)
Bicarbonate: 27.4 mmol/L (ref 20.0–28.0)
Bicarbonate: 25.4 mmol/L (ref 20.0–28.0)
Calcium, Ion: 1.2 mmol/L (ref 1.15–1.40)
Calcium, Ion: 1.23 mmol/L (ref 1.15–1.40)
HCT: 40 % (ref 36.0–46.0)
HCT: 47 % — ABNORMAL HIGH (ref 36.0–46.0)
Hemoglobin: 13.6 g/dL (ref 12.0–15.0)
Hemoglobin: 16 g/dL — ABNORMAL HIGH (ref 12.0–15.0)
O2 Saturation: 87 %
O2 Saturation: 94 %
Patient temperature: 98.2
Patient temperature: 98.6
Potassium: 3.3 mmol/L — ABNORMAL LOW (ref 3.5–5.1)
Potassium: 3.6 mmol/L (ref 3.5–5.1)
Sodium: 147 mmol/L — ABNORMAL HIGH (ref 135–145)
Sodium: 145 mmol/L (ref 135–145)
TCO2: 29 mmol/L (ref 22–32)
TCO2: 27 mmol/L (ref 22–32)
pCO2 arterial: 60 mmHg — ABNORMAL HIGH (ref 32.0–48.0)
pCO2 arterial: 49.4 mmHg — ABNORMAL HIGH (ref 32.0–48.0)
pH, Arterial: 7.267 — ABNORMAL LOW (ref 7.350–7.450)
pH, Arterial: 7.319 — ABNORMAL LOW (ref 7.350–7.450)
pO2, Arterial: 62 mmHg — ABNORMAL LOW (ref 83.0–108.0)
pO2, Arterial: 77 mmHg — ABNORMAL LOW (ref 83.0–108.0)

## 2019-11-21 LAB — BASIC METABOLIC PANEL
Anion gap: 11 (ref 5–15)
BUN: 30 mg/dL — ABNORMAL HIGH (ref 8–23)
CO2: 26 mmol/L (ref 22–32)
Calcium: 8.6 mg/dL — ABNORMAL LOW (ref 8.9–10.3)
Chloride: 109 mmol/L (ref 98–111)
Creatinine, Ser: 1.18 mg/dL — ABNORMAL HIGH (ref 0.44–1.00)
GFR calc Af Amer: 53 mL/min — ABNORMAL LOW (ref >60)
GFR calc non Af Amer: 46 mL/min — ABNORMAL LOW (ref >60)
Glucose, Bld: 81 mg/dL (ref 70–99)
Potassium: 3.7 mmol/L (ref 3.5–5.1)
Sodium: 146 mmol/L — ABNORMAL HIGH (ref 135–145)

## 2019-11-21 LAB — URINALYSIS, ROUTINE W REFLEX MICROSCOPIC
Bilirubin Urine: NEGATIVE
Glucose, UA: NEGATIVE mg/dL
Hgb urine dipstick: NEGATIVE
Ketones, ur: NEGATIVE mg/dL
Leukocytes,Ua: NEGATIVE
Nitrite: NEGATIVE
Protein, ur: NEGATIVE mg/dL
Specific Gravity, Urine: 1.017 (ref 1.005–1.030)
pH: 5 (ref 5.0–8.0)

## 2019-11-21 LAB — HEPATIC FUNCTION PANEL
ALT: 18 U/L (ref 0–44)
AST: 31 U/L (ref 15–41)
Albumin: 3 g/dL — ABNORMAL LOW (ref 3.5–5.0)
Alkaline Phosphatase: 50 U/L (ref 38–126)
Bilirubin, Direct: 0.2 mg/dL (ref 0.0–0.2)
Indirect Bilirubin: 0.3 mg/dL (ref 0.3–0.9)
Total Bilirubin: 0.5 mg/dL (ref 0.3–1.2)
Total Protein: 6.7 g/dL (ref 6.5–8.1)

## 2019-11-21 LAB — CK: Total CK: 243 U/L — ABNORMAL HIGH (ref 38–234)

## 2019-11-21 LAB — CBC
HCT: 48.3 % — ABNORMAL HIGH (ref 36.0–46.0)
Hemoglobin: 14.3 g/dL (ref 12.0–15.0)
MCH: 27.9 pg (ref 26.0–34.0)
MCHC: 29.6 g/dL — ABNORMAL LOW (ref 30.0–36.0)
MCV: 94.3 fL (ref 80.0–100.0)
Platelets: 208 10*3/uL (ref 150–400)
RBC: 5.12 MIL/uL — ABNORMAL HIGH (ref 3.87–5.11)
RDW: 16.3 % — ABNORMAL HIGH (ref 11.5–15.5)
WBC: 6.2 10*3/uL (ref 4.0–10.5)
nRBC: 0 % (ref 0.0–0.2)

## 2019-11-21 LAB — LACTIC ACID, PLASMA: Lactic Acid, Venous: 0.5 mmol/L (ref 0.5–1.9)

## 2019-11-21 LAB — SARS CORONAVIRUS 2 BY RT PCR (HOSPITAL ORDER, PERFORMED IN ~~LOC~~ HOSPITAL LAB): SARS Coronavirus 2: NEGATIVE

## 2019-11-21 MED ORDER — ONDANSETRON HCL 4 MG PO TABS: 4.0000 mg | ORAL_TABLET | Freq: Four times a day (QID) | ORAL | Status: DC | PRN

## 2019-11-21 MED ORDER — LORAZEPAM 2 MG/ML IJ SOLN
1.0000 mg | INTRAMUSCULAR | Status: DC | PRN
  Administered 2019-12-05: 2 mg via INTRAVENOUS
  Filled 2019-11-21: qty 1

## 2019-11-21 MED ORDER — SODIUM CHLORIDE 0.9 % IV BOLUS
500.0000 mL | Freq: Once | INTRAVENOUS | Status: AC
  Administered 2019-11-21: 500 mL via INTRAVENOUS

## 2019-11-21 MED ORDER — ALBUTEROL SULFATE (2.5 MG/3ML) 0.083% IN NEBU: 2.5000 mg | INHALATION_SOLUTION | RESPIRATORY_TRACT | Status: DC | PRN

## 2019-11-21 MED ORDER — ROPINIROLE HCL 1 MG PO TABS
1.0000 mg | ORAL_TABLET | Freq: Every day | ORAL | Status: DC
  Administered 2019-11-22 – 2019-12-07 (×16): 1 mg via ORAL
  Filled 2019-11-21 (×19): qty 1

## 2019-11-21 MED ORDER — POLYETHYLENE GLYCOL 3350 17 G PO PACK: 17.0000 g | PACK | Freq: Every day | ORAL | Status: DC | PRN

## 2019-11-21 MED ORDER — SODIUM CHLORIDE 0.9 % IV SOLN: INTRAVENOUS | Status: DC

## 2019-11-21 MED ORDER — GABAPENTIN 400 MG PO CAPS
800.0000 mg | ORAL_CAPSULE | Freq: Three times a day (TID) | ORAL | Status: DC
  Administered 2019-11-22 – 2019-12-08 (×50): 800 mg via ORAL
  Filled 2019-11-21 (×51): qty 2

## 2019-11-21 MED ORDER — ACETAMINOPHEN 650 MG RE SUPP: 650.0000 mg | Freq: Four times a day (QID) | RECTAL | Status: DC | PRN

## 2019-11-21 MED ORDER — GADOBUTROL 1 MMOL/ML IV SOLN
8.5000 mL | Freq: Once | INTRAVENOUS | Status: AC | PRN
  Administered 2019-11-21: 8.5 mL via INTRAVENOUS

## 2019-11-21 MED ORDER — UMECLIDINIUM BROMIDE 62.5 MCG/INH IN AEPB
1.0000 | INHALATION_SPRAY | Freq: Every day | RESPIRATORY_TRACT | Status: DC
  Administered 2019-11-25 – 2019-12-08 (×12): 1 via RESPIRATORY_TRACT
  Filled 2019-11-21 (×3): qty 7

## 2019-11-21 MED ORDER — ACETAMINOPHEN 325 MG PO TABS
650.0000 mg | ORAL_TABLET | Freq: Four times a day (QID) | ORAL | Status: DC | PRN
  Administered 2019-11-22 – 2019-12-08 (×8): 650 mg via ORAL
  Filled 2019-11-21 (×8): qty 2

## 2019-11-21 MED ORDER — FLUTICASONE FUROATE-VILANTEROL 200-25 MCG/INH IN AEPB
1.0000 | INHALATION_SPRAY | Freq: Every day | RESPIRATORY_TRACT | Status: DC
  Administered 2019-11-25 – 2019-12-08 (×12): 1 via RESPIRATORY_TRACT
  Filled 2019-11-21 (×4): qty 28

## 2019-11-21 MED ORDER — ALBUTEROL SULFATE HFA 108 (90 BASE) MCG/ACT IN AERS: 1.0000 | INHALATION_SPRAY | Freq: Four times a day (QID) | RESPIRATORY_TRACT | Status: DC | PRN

## 2019-11-21 MED ORDER — APIXABAN 5 MG PO TABS
5.0000 mg | ORAL_TABLET | Freq: Two times a day (BID) | ORAL | Status: AC
  Administered 2019-11-22 – 2019-11-23 (×4): 5 mg via ORAL
  Filled 2019-11-21 (×6): qty 1

## 2019-11-21 MED ORDER — SODIUM CHLORIDE 0.9 % IV SOLN: 75.0000 mL/h | INTRAVENOUS | Status: DC

## 2019-11-21 MED ORDER — GABAPENTIN 800 MG PO TABS
800.0000 mg | ORAL_TABLET | Freq: Three times a day (TID) | ORAL | Status: DC
  Filled 2019-11-21: qty 1

## 2019-11-21 MED ORDER — DICLOFENAC SODIUM 1 % EX GEL
4.0000 g | Freq: Two times a day (BID) | CUTANEOUS | Status: DC | PRN
  Filled 2019-11-21: qty 100

## 2019-11-21 MED ORDER — VITAMIN A 3 MG (10000 UNIT) PO CAPS
10000.0000 [IU] | ORAL_CAPSULE | Freq: Every day | ORAL | Status: DC
  Administered 2019-11-22: 10000 [IU] via ORAL
  Filled 2019-11-21 (×5): qty 1

## 2019-11-21 MED ORDER — VITAMIN B50 COMPLEX PO TBCR: EXTENDED_RELEASE_TABLET | Freq: Every day | ORAL | Status: DC

## 2019-11-21 MED ORDER — VITAMIN C 250 MG PO TABS
125.0000 mg | ORAL_TABLET | Freq: Every day | ORAL | Status: DC
  Administered 2019-11-22 – 2019-12-08 (×17): 125 mg via ORAL
  Filled 2019-11-21 (×19): qty 1

## 2019-11-21 MED ORDER — LORATADINE 10 MG PO TABS
10.0000 mg | ORAL_TABLET | Freq: Two times a day (BID) | ORAL | Status: DC
  Administered 2019-11-22 – 2019-12-08 (×34): 10 mg via ORAL
  Filled 2019-11-21 (×35): qty 1

## 2019-11-21 MED ORDER — ONDANSETRON HCL 4 MG/2ML IJ SOLN: 4.0000 mg | Freq: Four times a day (QID) | INTRAMUSCULAR | Status: DC | PRN

## 2019-11-21 MED ORDER — QUETIAPINE FUMARATE ER 50 MG PO TB24
150.0000 mg | ORAL_TABLET | Freq: Every day | ORAL | Status: DC
  Administered 2019-11-22 – 2019-12-07 (×16): 150 mg via ORAL
  Filled 2019-11-21 (×23): qty 3

## 2019-11-21 MED ORDER — SODIUM CHLORIDE 0.9% FLUSH: 3.0000 mL | Freq: Once | INTRAVENOUS | Status: DC

## 2019-11-21 MED ORDER — BICTEGRAVIR-EMTRICITAB-TENOFOV 50-200-25 MG PO TABS
1.0000 | ORAL_TABLET | Freq: Every day | ORAL | Status: DC
  Administered 2019-11-22 – 2019-12-08 (×17): 1 via ORAL
  Filled 2019-11-21 (×19): qty 1

## 2019-11-21 MED ORDER — MONTELUKAST SODIUM 10 MG PO TABS
10.0000 mg | ORAL_TABLET | Freq: Every day | ORAL | Status: DC
  Administered 2019-11-22 – 2019-12-07 (×16): 10 mg via ORAL
  Filled 2019-11-21 (×19): qty 1

## 2019-11-21 MED ORDER — VENLAFAXINE HCL ER 75 MG PO CP24
150.0000 mg | ORAL_CAPSULE | Freq: Every day | ORAL | Status: DC
  Administered 2019-11-22 – 2019-12-08 (×17): 150 mg via ORAL
  Filled 2019-11-21 (×4): qty 2
  Filled 2019-11-21: qty 1
  Filled 2019-11-21 (×2): qty 2
  Filled 2019-11-21: qty 1
  Filled 2019-11-21 (×3): qty 2
  Filled 2019-11-21: qty 1
  Filled 2019-11-21 (×3): qty 2
  Filled 2019-11-21: qty 1
  Filled 2019-11-21 (×3): qty 2

## 2019-11-21 MED ORDER — TIOTROPIUM BROMIDE MONOHYDRATE 2.5 MCG/ACT IN AERS: 2.0000 | INHALATION_SPRAY | Freq: Every day | RESPIRATORY_TRACT | Status: DC

## 2019-11-21 NOTE — ED Notes (Signed)
Phlebotomy got 1st set of blood cultures and lactic , unable to obtain  2 nd set ,

## 2019-11-21 NOTE — ED Notes (Signed)
Pt placed on hospital bed for comfort. New purwick place. New sheets placed.

## 2019-11-21 NOTE — ED Provider Notes (Signed)
[facility] EMERGENCY DEPARTMENT Provider Note   CSN: [id number] Arrival date & time: 11/21/19  0556     History Chief Complaint  Patient presents with  . Fall    [name] is a 74 y.o. female.  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  Patient reports that she fell onto her left side.  She was unable to get up.  Someone called EMS for her.  See triage note for EMS report.  Patient currently works reporting left hip and lower back pain.  She was hypotensive on scene and had oxygen saturation in the 80s, however this was on room air and not her usual supplemental oxygen.  Patient was reportedly found with food in her bed, incontinence.  Patient was given 500 cc normal saline fluid bolus by EMS en route.        Past Medical History:  Diagnosis Date  . Asthma   . Atypical squamous cells of undetermined significance (ASCUS) on Papanicolaou smear of cervix   . Bed bug bite 07/12/2019  . COPD (chronic obstructive pulmonary disease) ([place])   . H/O drainage of abscess    left axilla, from left breast  . Hep B w/o coma   . HIV (human immunodeficiency virus infection) ([place])   . Hyperlipidemia   . Lung cancer ([place]) 12/14/13   LUL Adenocarcinoma  . Neuromuscular disorder ([place])    fingers/feet neuropathy  . Non-small cell lung cancer ([place])   . S/P radiation therapy 01/26/14-02/02/14   sbrt  lt upper lung-54Gy/66fx    Patient Active Problem List   Diagnosis Date Noted  . Bed bug bite 07/12/2019  . DNR (do not resuscitate)   . Altered mental status   . Acute pulmonary embolism without acute cor pulmonale (HCC)   .  Acute ischemic right MCA stroke ([place])   . Palliative care encounter   . Aortic embolism or thrombosis ([place])   . CVA (cerebral vascular accident) ([place]) 03/02/2019  . COPD (chronic obstructive pulmonary disease) ([place]) 03/01/2019  . PE (pulmonary thromboembolism) ([place]) 03/01/2019  . Tobacco abuse 03/01/2019  . 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[place]) 03/01/2019  . CKD (chronic kidney disease), stage III 03/01/2019  . Generalized weakness 03/01/2019  . Fall at home   . 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([place])   . Splenic infarct   . Acute and chronic respiratory failure (acute-on-chronic) ([place]) 02/11/2019  . CKD (chronic kidney disease) stage 3, GFR 30-59 ml/min 08/12/2016  . Lung nodule 10/12/2015  . Bronchitis 09/09/2015  . History of lung cancer 09/08/2015  . Thoracic aorta atherosclerosis ([place]) 09/08/2015  . Chronic obstructive pulmonary disease with (acute) exacerbation ([place]) 09/08/2015  . Sepsis ([place]) 03/28/2015  . CAP (community acquired pneumonia) 03/28/2015  . UTI (lower urinary tract infection) 03/28/2015  . Respiratory failure ([place]) 03/28/2015  . Non-small cell lung cancer ([place])   . Malignant neoplasm of upper lobe, bronchus or lung 01/03/2014  . Acute hypoxemic respiratory failure ([place]) 10/31/2013  . COPD with acute exacerbation ([place]) 10/30/2013  . COPD exacerbation ([place]) 10/30/2013  . Other and unspecified noninfectious gastroenteritis and colitis(558.9) 11/20/2012  . Rectal bleed 11/19/2012  . Environmental allergies 11/02/2012  . Obesity  05/05/2012  . Renal insufficiency 05/05/2012  . Hyperglycemia 05/05/2012  . Asthma 12/10/2011  . Cigarette smoker 12/10/2011  . Hypercholesteremia 12/11/2010  . Hypertension 12/11/2010  . ALLERGIC RHINITIS, SEASONAL 07/03/2010  . COPD 07/03/2010  . HIV (human immunodeficiency virus infection) ([place]) 12/09/2006  . DEPRESSION 12/09/2006  . BREAST MASS, BENIGN 12/09/2006  . OSTEOPOROSIS 12/09/2006    Past Surgical History:  Procedure Laterality Date  . ABCESS  DRAINAGE     abcess under Left arm, abcess from L breast  . BUBBLE STUDY  03/08/2019   Procedure: BUBBLE STUDY;  Surgeon: [name], MD;  Location: [facility];  Service: Cardiovascular;;  . removal of abnormal cells on uterus    . TEE WITHOUT CARDIOVERSION N/A 03/08/2019   Procedure: TRANSESOPHAGEAL ECHOCARDIOGRAM (TEE);  Surgeon: [name], MD;  Location: [facility] ENDOSCOPY;  Service: Cardiovascular;  Laterality: N/A;  . thyroid gland removed       OB History   No obstetric history on file.     Family History  Problem Relation Age of Onset  . Pneumonia Mother   . Hypertension Sister   . Diabetes Sister   . Cancer Sister        pancreatic and lung  . Cancer Brother        pancreatic  . Cancer Brother        pancreatic    Social History   Tobacco Use  . Smoking status: Current Every Day Smoker    Packs/day: 1.00    Years: 40.00    Pack years: 40.00    Types: Cigarettes  . Smokeless tobacco: Never Used  . Tobacco comment: quit date the day she starts her radiation at the [facility]  Substance Use Topics  . Alcohol use: No    Alcohol/week: 0.0 standard drinks    Comment: none in 20 years  . Drug use: No    Comment: none in 20 years    Home Medications Prior to Admission medications   Medication Sig Start Date End Date Taking? Authorizing Provider  albuterol (PROVENTIL HFA;VENTOLIN HFA) 108 (90 BASE) MCG/ACT inhaler Inhale 1 puff into the lungs every 6 (six) hours as needed for shortness of breath.     [provider]  albuterol (PROVENTIL) (2.5 MG/3ML) 0.083% nebulizer solution Take 3 mLs (2.5 mg total) by nebulization every 4 (four) hours as needed for wheezing or shortness of breath. 01/18/19   Jaynee Eagles, PA-C  apixaban (ELIQUIS) 5 MG TABS tablet Take 1 tablet (5 mg total) by mouth 2 (two) times daily. 03/13/19   Hosie Poisson, MD  Ascorbic Acid (VITAMIN C) 100 MG tablet Take 100 mg by mouth daily.    [provider]  B Complex-Biotin-FA  (VITAMIN B50 COMPLEX PO) Take 1 tablet by mouth daily.    [provider]  bictegravir-emtricitabine-tenofovir AF (BIKTARVY) 50-200-25 MG TABS tablet Take 1 tablet by mouth daily. 07/12/19   Truman Hayward, MD  diclofenac Sodium (VOLTAREN) 1 % GEL Apply 4 g topically daily. 11/09/19   [provider]  Fluticasone-Salmeterol (ADVAIR) 500-50 MCG/DOSE AEPB Inhale 1 puff into the lungs every 12 (twelve) hours.    [provider]  gabapentin (NEURONTIN) 800 MG tablet Take 800 mg by mouth 3 (three) times daily. 10/26/19   [provider]  hydrOXYzine (ATARAX/VISTARIL) 25 MG tablet Take 1 tablet (25 mg total) by mouth every 6 (six) hours. 07/02/19   Raylene Everts, MD  loratadine (CLARITIN) 10 MG tablet Take 10 mg by  mouth 2 (two) times daily.     [provider]  montelukast (SINGULAIR) 10 MG tablet Take 1 tablet (10 mg total) by mouth at bedtime. 12/02/12   Michel Bickers, MD  permethrin (ELIMITE) 5 % cream See admin instructions. apply to all body parts from neck to feet and wash off after 12 hours. May repeat in 7 days prn 11/06/19   [provider]  QUEtiapine Fumarate (SEROQUEL XR) 150 MG 24 hr tablet Take 150 mg by mouth at bedtime. 11/09/19   [provider]  rOPINIRole (REQUIP) 1 MG tablet Take 1 tablet (1 mg total) by mouth at bedtime. 05/20/19   Ward, Kristen N, DO  SPIRIVA RESPIMAT 2.5 MCG/ACT AERS INHALE 2 PUFFS INTO THE LUNGS DAILY Patient taking differently: Inhale 2 puffs into the lungs daily.  09/24/14   Rigoberto Noel, MD  tiZANidine (ZANAFLEX) 4 MG tablet Take 4 mg by mouth 2 (two) times daily as needed. 11/08/19   [provider]  triamcinolone ointment (KENALOG) 0.5 % Apply 1 application topically 2 (two) times daily. 08/01/19   Larene Pickett, PA-C  venlafaxine XR (EFFEXOR-XR) 150 MG 24 hr capsule Take 150 mg by mouth daily. 11/09/19   [provider]  VITAMIN A PO Take 1 tablet by mouth daily.    [provider]    Allergies    Patient has no known allergies.  Review of Systems   Review of Systems  Constitutional: Negative for fever.  HENT: Negative for rhinorrhea and sore throat.   Eyes: Negative for redness.  Respiratory: Negative for cough.   Cardiovascular: Negative for chest pain.  Gastrointestinal: Negative for abdominal pain, diarrhea, nausea and vomiting.  Genitourinary: Negative for dysuria.  Musculoskeletal: Positive for arthralgias, back pain and myalgias.  Skin: Negative for rash.  Neurological: Negative for headaches.    Physical Exam Updated Vital Signs BP 97/64   Pulse 75   Temp 98.2 F (36.8 C) (Oral)   Resp 17   SpO2 96%   Physical Exam Vitals and nursing note reviewed.  Constitutional:      Appearance: She is well-developed.  HENT:     Head: Normocephalic and atraumatic.     Mouth/Throat:     Mouth: Mucous membranes are dry.  Eyes:     General:        Right eye: No discharge.        Left eye: No discharge.     Conjunctiva/sclera: Conjunctivae normal.  Cardiovascular:     Rate and Rhythm: Normal rate and regular rhythm.     Heart sounds: Normal heart sounds.  Pulmonary:     Effort: Pulmonary effort is normal.     Breath sounds: Normal breath sounds.  Abdominal:     Palpations: Abdomen is soft.     Tenderness: There is no abdominal tenderness.  Musculoskeletal:     Right shoulder: No tenderness. Normal range of motion.     Left shoulder: No tenderness. Normal range of motion.     Right elbow: Normal range of motion. No tenderness.     Left elbow: Normal range of motion. No tenderness.     Right wrist: No tenderness. Normal range of motion.     Left wrist: No tenderness. Normal range of motion.     Cervical back: Normal range of motion and neck supple. No tenderness or bony tenderness.     Thoracic back: No tenderness or bony tenderness.     Lumbar back: Tenderness and bony  tenderness present.     Right hip: No tenderness or bony  tenderness. Normal range of motion.     Left hip: Tenderness (over greater trochanter) present. No bony tenderness. Normal range of motion.     Right upper leg: No tenderness.     Left upper leg: Tenderness present.     Right knee: Normal range of motion. No tenderness.     Left knee: Normal range of motion. No tenderness.     Right lower leg: No tenderness.     Left lower leg: No tenderness.     Right ankle: No tenderness. Normal range of motion.     Left ankle: No tenderness. Normal range of motion.  Skin:    General: Skin is warm and dry.  Neurological:     Mental Status: She is lethargic and disoriented.     GCS: GCS eye subscore is 2. GCS verbal subscore is 5. GCS motor subscore is 6.     Comments: Pt minimally cooperative with exam. She is lethargic. Awakens with light sternal rub, hard to keep aroused and falls to sleep quickly.  Answers basic questions.  Follows basic commands. Grip strength is present but decreased on the L.  Able to hold up R arm, not able to hold up L arm.  She can wiggle toes on right, decreased on left. GCS 13.     ED Results / Procedures / Treatments   Labs (all labs ordered are listed, but only abnormal results are displayed) Labs Reviewed  BASIC METABOLIC PANEL - Abnormal; Notable for the following components:      Result Value   Sodium 146 (*)    BUN 30 (*)    Creatinine, Ser 1.18 (*)    Calcium 8.6 (*)    GFR calc non Af Amer 46 (*)    GFR calc Af Amer 53 (*)    All other components within normal limits  CBC - Abnormal; Notable for the following components:   RBC 5.12 (*)    HCT 48.3 (*)    MCHC 29.6 (*)    RDW 16.3 (*)    All other components within normal limits  HEPATIC FUNCTION PANEL - Abnormal; Notable for the following components:   Albumin 3.0 (*)    All other components within normal limits  CK - Abnormal; Notable for the following components:   Total CK 243 (*)    All other components within normal limits  POCT I-STAT 7, (LYTES,  BLD GAS, ICA,H+H) - Abnormal; Notable for the following components:   pH, Arterial 7.267 (*)    pCO2 arterial 60.0 (*)    pO2, Arterial 62 (*)    Sodium 147 (*)    Potassium 3.3 (*)    All other components within normal limits  SARS CORONAVIRUS 2 BY RT PCR (HOSPITAL ORDER, [facility])  CULTURE, BLOOD (ROUTINE X 2)  CULTURE, BLOOD (ROUTINE X 2)  URINALYSIS, ROUTINE W REFLEX MICROSCOPIC  LACTIC ACID, PLASMA  T-HELPER CELLS (CD4) COUNT (NOT AT [facility])  I-STAT ARTERIAL BLOOD GAS, ED    EKG EKG Interpretation  Date/Time:  Tuesday Nov 21 2019 78:46:96 EDT Ventricular Rate:  80 PR Interval:    QRS Duration: 88 QT Interval:  392 QTC Calculation: 453 R Axis:   71 Text Interpretation: Sinus rhythm Atrial premature complex Probable left atrial enlargement Nonspecific T abnrm, anterolateral leads TWI in V3 resolved Otherwise no significant change Confirmed by Addison Lank ([id number]) on 11/21/2019 6:23:55 AM  Radiology DG Chest Port 1 View  Result Date: 11/21/2019 CLINICAL DATA:  Fall and hypoxia EXAM: PORTABLE CHEST 1 VIEW COMPARISON:  03/03/2019 FINDINGS: Normal heart size. Prominent main pulmonary artery contour. Streaky opacity in the left mid lung where there is chronic rib deformity that is likely sequela of lung cancer treatment. There is no edema, consolidation, effusion, or pneumothorax. IMPRESSION: No acute finding or change from prior. Electronically Signed   By: Monte Fantasia M.D.   On: 11/21/2019 07:06    Procedures Procedures (including critical care time)  Medications Ordered in ED Medications  sodium chloride flush (NS) 0.9 % injection 3 mL (has no administration in time range)  sodium chloride 0.9 % bolus 500 mL (0 mLs Intravenous Stopped 11/21/19 0820)  sodium chloride 0.9 % bolus 500 mL (0 mLs Intravenous Stopped 11/21/19 0926)    ED Course  I have reviewed the triage vital signs and the nursing notes.  Pertinent labs & imaging results  that were available during my care of the patient were reviewed by me and considered in my medical decision making (see chart for details).  Patient seen and examined. Work-up initiated. Additional fluids ordered as patient has dry mucous membranes.  Will x-ray her lower back and hip, although she has reasonable range of motion in her hip and I have low concern for fracture overall.  No evidence of head injury on exam.  No reports of head injury.  Lab work-up is pending.  We will also need to check UA.  Chest x-ray ordered prior to my exam.  Patient seems to be oxygenating well on her standard supplementation.  EKG reviewed personally.   Vital signs reviewed and are as follows: BP 97/64   Pulse 75   Temp 98.2 F (36.8 C) (Oral)   Resp 17   SpO2 96%   7:13 AM BUN/Crt = 25 today which could indicate dehydration. Will reassess after additional fluids. If work-up without other significant findings, patient will need TOC consultation given questions about her living conditions and ability to care for herself.   8:45 AM BP reported low (80's) by RN. Additional fluids ordered. Added lactate and blood cultures. Latest BP in Epic from 7:45am. On monitor, she has had BP's in 70's and 80's since 8am, although latest low 470'L systolic.   Patient evaluated by Dr. Stark Jock and myself. She rouses to voice/gentle shaking. Remains tough to keep awake. Added ABG, head CT.   BP (!) 101/58   Pulse 79   Temp 98.2 F (36.8 C) (Oral)   Resp 18   SpO2 92%   9:46 AM I spoke with radiologist regarding abnormal head CT findings. MR brain with contrast ordered. ABG reviewed earlier.   11:05 AM I rechecked patient. Exam and mental status unchanged. Continuing to await MRI.   1:44 PM Pt rechecked. She is more awake and alert now. Only complaint is lower back pain. Discussed need for MRI. GCS 15.   3:18 PM Discussed with Billie Lade who will follow-up on MRI results, involve any appropriate consultants, and likely  admit to medicine.  BP (!) 141/64   Pulse 70   Temp 98.2 F (36.8 C) (Oral)   Resp 16   SpO2 93%     Clinical Course as of Nov 21 1515  Tue Nov 21, 2019  1510 1. Large, isodense mass at the right apex with vasogenic edema. Recommend brain MRI with contrast. 2. Remote right MCA branch infarct.  CT Head Wo Contrast [CG]  1510 BP Improved with IVF  BP(!): 141/64 [CG]    Clinical Course User Index [CG] Kinnie Feil, PA-C   MDM Rules/Calculators/A&P                      Pending completion of work-up.   Final Clinical Impression(s) / ED Diagnoses Final diagnoses:  Hypoxia  Cerebral edema [facility])  Encephalopathy    Rx / DC Orders ED Discharge Orders    None       Carlisle Cater, PA-C 11/21/19 1519    Veryl Speak, MD 11/23/19 [phone]

## 2019-11-21 NOTE — ED Notes (Signed)
Pt requesting food/drink. Pt informed pending results from scan and neuro consult. NPO at this time.

## 2019-11-21 NOTE — ED Notes (Signed)
Hospital bed ordered per RN request

## 2019-11-21 NOTE — ED Triage Notes (Signed)
Pt says she is "fighting bed bugs" at home

## 2019-11-21 NOTE — ED Triage Notes (Signed)
Pt arrives via [facility] from home with c/o fall. On arrival to scene pt was face down, she saID she was reaching for a pill that had fallen on the floor and fell out of the bed. Hx of left sided paralysis from stroke. C/o left hip pain without shortening or rotation. NO loc, did not hit her head. 80/50 initial. In the home, there was dried food in pt bed, she is still in the gown she was in when she left the hospital on the 22nd, she is incontienet, all other members in the home were elderly and unable to provide information about the patinet. En route she was given 500NS en route to 20 in the right wrist. SPO2 87% RA.

## 2019-11-21 NOTE — ED Triage Notes (Signed)
Pt says that she has had dizziness for about 3 weeks. She says that she is hurting in her shoulder and hip. Says that she got up to go to the kitchen tonight and stooped in the bathroom, then fell in the hallway and laid in the hallway for about and hour, and her roommates helped her back to her bed.

## 2019-11-21 NOTE — ED Provider Notes (Signed)
1605: patient handed off to me by previous EDPA please see previous note for full details.    Briefly, patient reportedly fell this morning.  She is able to tell me pretty accurate account of event, states she was on her way to the kitchen, stopped at bathroom and on her way out was trying to check the lock on her roller walker and she lost her balance and fell. Reports left low back and hip pain but has no other complaints.    Initial work up geared towards undifferentiated AMS and hypotension SBP in the 80s, patient was only arousable to sternal rub, frequently falling asleep and following simple commands. Has become more alert since ER and SBP normalized after IVF.  Chronic left sided weakness from previous CVA. Questionable living situation   Physical Exam  BP (!) 153/80   Pulse 74   Temp 98.2 F (36.8 C) (Oral)   Resp 18   SpO2 95%   Physical Exam Constitutional:      Appearance: She is well-developed.  HENT:     Head: Normocephalic.     Nose: Nose normal.  Eyes:     General: Lids are normal.  Cardiovascular:     Rate and Rhythm: Normal rate.  Pulmonary:     Effort: Pulmonary effort is normal. No respiratory distress.     Comments: SPO2 89% however patient is nasal cannula is not in place.  When put on nasal cannula her oxygen saturation is greater than 92%.  Minimal wheezing in lower lobes bilaterally.  Speaking in full sentences.  No signs of respiratory distress. Abdominal:     Palpations: Abdomen is soft.     Tenderness: There is no abdominal tenderness.  Musculoskeletal:        General: Tenderness present. Normal range of motion.     Cervical back: Normal range of motion.     Comments: Mild tenderness to the left greater trochanter and lateral buttock.  Patient has no pain with left leg roll, flexion.  No leg shortening or rotation.  Pelvis stable.  Mild left and midline low back tenderness however patient able to roll and sit up independently, with mild pain only.   Neurological:     Mental Status: She is alert and oriented to person, place, and time.  Psychiatric:        Behavior: Behavior normal.     ED Course/Procedures   Clinical Course as of Nov 21 2107  Tue Nov 21, 2019  1510 1. Large, isodense mass at the right apex with vasogenic edema. Recommend brain MRI with contrast. 2. Remote right MCA branch infarct.  CT Head Wo Contrast [CG]  1510 BP Improved with IVF  BP(!): 141/64 [CG]  1614 CK Total(!): 243 [CG]  1615 Hemoglobin: 14.3 [CG]  1615 Creatinine(!): 1.18 [CG]  1615 GFR, Est African American(!): 53 [CG]  1616 Sinus rhythm Atrial premature complex Probable left atrial enlargement Nonspecific T abnrm, anterolateral leads TWI in V3 resolved Otherwise no significant change Confirmed by Addison Lank [phone]) on 11/21/2019 6:23:55 AM  EKG 12-Lead [CG]  62 Consulted neurology Dr Aroor recommends nsgy consult, concern for edema possible seizure.  No recommendation to give keppra bolus right now, consider steroids per nsgy recommendation   [CG]  1901 Re-evaluated patient, no clinical decline.  She is going to try to ambulate here.  Pending nsgy consult.    [CG]    Clinical Course User Index [CG] Kinnie Feil, PA-C    Procedures  MDM  1618: ER work-up and nursing notes personally reviewed and interpreted to assist with history and MDM.  ER work-up is remarkable for large mass in the right apex with edema, radiologist spoke to previous ED PA who notes this is larger than previously noted in September 2020.  Labs otherwise nonacute.  X-rays nontraumatic.  On my exam patient is fully oriented, alert and able to provide accurate details of events leading up to visit to the ER.  Her only complaint is low back and left hip pain.  States she was not aware that there was a "mass" in her brain.  I reviewed patient's chart and I do not see any documentation about this abnormal finding on her MRI in September 2020 or neurology  notes.  1911: Patient re-evaluated several times. No clinical decline or changes in mental status.  She is attempting to walk.  MRI personally reviewed and interpreted, discussed with neurology Dr Aroor and neurosurgery Dr Zada Finders.  NSGY recommend admitting for EEG to determine if underlying seizure from edema/mass effect.  Will likely need antiepileptics, steroids based on EEG.  No recommendation for starting antiepileptics or steroids in ER. Will request admission. Will order seizure precautions.     Kinnie Feil, PA-C 11/21/19 2109    Lucrezia Starch, MD 11/22/19 1009

## 2019-11-21 NOTE — Progress Notes (Signed)
ABG results given to RN and MD. No new changes made at this time.

## 2019-11-21 NOTE — ED Notes (Signed)
PO fluids/drink given to pt. Per PA.

## 2019-11-21 NOTE — ED Notes (Signed)
Patient transported to X-ray 

## 2019-11-21 NOTE — H&P (Signed)
History and Physical    [name] ZOX:[id number] DOB: [birth year] DOA: 11/21/2019  PCP: Michelle Ebbs, MD  Patient coming from: Home  I have personally briefly reviewed patient's old medical records in [place]  Chief Complaint: Home  HPI: [name] is a 74 y.o. 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 Per ems patient was found face down , but noted to able to give some history of her fall and noted complaints of pain on her left hip and back and denied any LOC at that time. Per EMS patient was found to be unkempt and her room was note to be squalid condition. At the scene her blood pressure as 80/50, sat was 87% she was off her home O2.She was treated with 500 ns en route. Per patient who on my interview is back to her baseline ms,states she was ambulating the bathroom when she reached for her walker lost her grip/balance and fell. She notes no prodrome prior to this episode and states she felt well. She states s/p fall she was unable to get up and ems was called. She also noted that during fall she did hit her chin on her walker and the walker tumbled and make contact with her forehead. She did however note that she did not have LOC. She did however note pain on left side ,hip and back. She also notes once she was placed to sit in chair she felt presyncopal, but states again had no LOC. She notes no fever , but has had chills, no sob, nausea/vomitting, dysuria, diarrhea,  abdominal pain or chest pain,no new focal weakness,no change in speech, no difficulty swallowing, note intermittent incontinence of her bladder but not of her bowels. She does however note that she has had recent history of treatment for pediculosis and still suffers from skin irritation due to that. She also does admit to being weak and not having much help at home.   ED Course:   IN ed patient was noted to be lethargic and was unable to give history initial and worsen ensured related to change in mental status.   Vitals: afeb, bp 95/61, hr 81, rr 15, sat 88%, placed on2 L [place] with improvement to 95/61. Labs: Wbc 6.2, hgb:14.3 at [place], cr 1.18 up from 0.98,  UA-negative, SARS-COV negative  K 3.3,  Na 147 Abg: 7.267/60/62 , repeat 7.31.0/49.4/77  Patient was treated with O2, ivfs  EKG: nsr with pac, no hyperacute st -twave changes  CT head: Large, isodense mass at the right apex with vasogenic edema. Recommend brain MRI with contrast. 2. Remote right MCA branch infarct.  MRIExtra-axial lesion at the vertex on the right with associated parenchymal edema and regional mass effect. Abuts superior sagittal sinus, which appears patent but invasion is difficult to exclude on this degraded study. A much smaller lesion was present in 2020 and this likely reflects a meningioma.  There is nonspecific abnormal adjacent sulcal signal, which may reflect compression related alteration of hemodynamics. A concurrent leptomeningeal process is not excluded  but unlikely unless this represents a metastasis.  Trace posterior right cerebral convexity subdural collection.  Neurosurgery /and neurology consulted from ed and recommended admission for further neuro work with eeg in am . At this time they did not recommend discontinuation of her anticoagulation or treatment with AED or steroids due to patient returning to her baseline.  Patient admitted with fall and acute change in ms multifactorial  related to hypoxemia/hypercarbia /hypotension in setting of dehydration as well as possible expanding brain lesion.   Review of Systems: As per HPI otherwise 10 point review of systems negative.   Past Medical History:  Diagnosis Date  . Asthma   . Atypical squamous cells of undetermined significance (ASCUS) on Papanicolaou smear of cervix   . Bed bug bite 07/12/2019  . COPD (chronic obstructive pulmonary disease) ([place])   . H/O drainage of abscess    left axilla, from left breast  . Hep B w/o coma   . HIV (human immunodeficiency virus infection) ([place])   . Hyperlipidemia   . Lung cancer ([place]) 12/14/13   LUL Adenocarcinoma  . Neuromuscular disorder ([place])    fingers/feet neuropathy  . Non-small cell lung cancer ([place])   . S/P radiation therapy 01/26/14-02/02/14   sbrt  lt upper lung-54Gy/2fx    Past Surgical History:  Procedure Laterality Date  . ABCESS DRAINAGE     abcess under Left arm, abcess from L breast  . BUBBLE STUDY  03/08/2019   Procedure: BUBBLE STUDY;  Surgeon: [name], MD;  Location: [facility];  Service: Cardiovascular;;  . removal of abnormal cells on uterus    . TEE WITHOUT CARDIOVERSION N/A 03/08/2019   Procedure: TRANSESOPHAGEAL ECHOCARDIOGRAM (TEE);  Surgeon: [name], MD;  Location: [facility];  Service: Cardiovascular;  Laterality: N/A;  . thyroid gland removed       reports that she has been smoking cigarettes. She has a 40.00 pack-year smoking history. She has never used smokeless tobacco. She reports that she does not drink alcohol or use drugs.  No Known Allergies  Family History  Problem Relation Age of Onset  . Pneumonia Mother   . Hypertension Sister   . Diabetes Sister   . Cancer Sister        pancreatic and lung  . Cancer Brother        pancreatic  . Cancer Brother        pancreatic    Prior to Admission medications   Medication Sig Start Date End Date Taking? Authorizing Provider  albuterol (PROVENTIL HFA;VENTOLIN HFA) 108  (90 BASE) MCG/ACT inhaler Inhale 1 puff into the lungs every 6 (six) hours as needed for shortness of breath.    Yes [provider]  albuterol (PROVENTIL) (2.5 MG/3ML) 0.083% nebulizer solution Take 3 mLs (2.5 mg total) by nebulization every 4 (four) hours as needed for wheezing or shortness of breath. 01/18/19  Yes Jaynee Eagles, PA-C  apixaban (ELIQUIS) 5 MG TABS tablet Take 1 tablet (5 mg total) by mouth 2 (two) times daily. 03/13/19  Yes Hosie Poisson, MD  Ascorbic Acid (VITAMIN C) 100 MG tablet Take 100 mg by mouth daily.   Yes [provider]  B Complex-Biotin-FA (VITAMIN B50 COMPLEX PO) Take 1 tablet by mouth daily.   Yes [provider]  bictegravir-emtricitabine-tenofovir AF (BIKTARVY) 50-200-25 MG TABS tablet Take 1 tablet by mouth daily. 07/12/19  Yes Tommy Medal, Lavell Islam, MD  diclofenac Sodium (VOLTAREN) 1 % GEL Apply 4 g topically 2 (two) times daily as  needed (pain).  11/09/19  Yes [provider]  Fluticasone-Salmeterol (ADVAIR) 500-50 MCG/DOSE AEPB Inhale 1 puff into the lungs every 12 (twelve) hours.   Yes [provider]  gabapentin (NEURONTIN) 800 MG tablet Take 800 mg by mouth 3 (three) times daily. 10/26/19  Yes [provider]  hydrOXYzine (ATARAX/VISTARIL) 25 MG tablet Take 1 tablet (25 mg total) by mouth every 6 (six) hours. 07/02/19  Yes Raylene Everts, MD  ibuprofen (ADVIL) 200 MG tablet Take 800 mg by mouth every 6 (six) hours as needed for moderate pain.   Yes [provider]  loratadine (CLARITIN) 10 MG tablet Take 10 mg by mouth 2 (two) times daily.    Yes [provider]  montelukast (SINGULAIR) 10 MG tablet Take 1 tablet (10 mg total) by mouth at bedtime. 12/02/12  Yes Michel Bickers, MD  permethrin (ELIMITE) 5 % cream See admin instructions. apply to all body parts from neck to feet and wash off after 12 hours. May repeat in 7 days prn 11/06/19  Yes [provider]  QUEtiapine Fumarate (SEROQUEL  XR) 150 MG 24 hr tablet Take 150 mg by mouth at bedtime. 11/09/19  Yes [provider]  rOPINIRole (REQUIP) 1 MG tablet Take 1 tablet (1 mg total) by mouth at bedtime. 05/20/19  Yes Ward, Kristen N, DO  SPIRIVA RESPIMAT 2.5 MCG/ACT AERS INHALE 2 PUFFS INTO THE LUNGS DAILY Patient taking differently: Inhale 2 puffs into the lungs daily.  09/24/14  Yes Rigoberto Noel, MD  tiZANidine (ZANAFLEX) 4 MG tablet Take 4 mg by mouth 2 (two) times daily as needed for muscle spasms.  11/08/19  Yes [provider]  triamcinolone ointment (KENALOG) 0.5 % Apply 1 application topically 2 (two) times daily. 08/01/19  Yes Larene Pickett, PA-C  venlafaxine XR (EFFEXOR-XR) 150 MG 24 hr capsule Take 150 mg by mouth daily. 11/09/19  Yes [provider]  VITAMIN A PO Take 1 tablet by mouth daily.   Yes [provider]    Physical Exam: Vitals:   11/21/19 1515 11/21/19 1545 11/21/19 1600 11/21/19 1930  BP: 131/76 132/78 (!) 141/84 (!) 153/80  Pulse: 75   74  Resp: 18 15 13 18   Temp:      TempSrc:      SpO2: 99%   95%    Constitutional: NAD, calm, comfortable Vitals:   11/21/19 1515 11/21/19 1545 11/21/19 1600 11/21/19 1930  BP: 131/76 132/78 (!) 141/84 (!) 153/80  Pulse: 75   74  Resp: 18 15 13 18   Temp:      TempSrc:      SpO2: 99%   95%   Eyes: pupils are equal noted b/l opacification due to b/l cataracts, lids and conjunctivae normal ENMT: Mucous membranes are dry. Posterior pharynx clear of any exudate or lesions.  Neck: normal, supple, no masses, no thyromegaly Respiratory: clear to auscultation bilaterally, no wheezing, faint crackles left lower lung. Normal respiratory effort. No accessory muscle use.  Cardiovascular: Regular rate and rhythm, no murmurs / rubs / gallops. No extremity edema.cool lower extremities  Abdomen: no tenderness, no masses palpated. No hepatosplenomegaly. Bowel sounds positive.  Musculoskeletal: No joint deformity upper and lower extremities.  Good ROM, no contractures. Normal muscle tone.  Skin: no rashes, lesions, ulcers. No induration, dark discoloration of lower extremities appears chronic Neurologic: CN 3-12 grossly intact. Sensation intact,   Strength 5/5 on the right , 4+/5 on the left.  Psychiatric: Normal judgment and insight. Alert  and oriented x 3. Normal mood.    Labs on Admission: I have personally reviewed following labs and imaging studies  CBC: Recent Labs  Lab 11/21/19 0628 11/21/19 0919  WBC 6.2  --   HGB 14.3 13.6  HCT 48.3* 40.0  MCV 94.3  --   PLT 208  --    Basic Metabolic Panel: Recent Labs  Lab 11/21/19 0628 11/21/19 0919  NA 146* 147*  K 3.7 3.3*  CL 109  --   CO2 26  --   GLUCOSE 81  --   BUN 30*  --   CREATININE 1.18*  --   CALCIUM 8.6*  --    GFR: CrCl cannot be calculated (Unknown ideal weight.). Liver Function Tests: Recent Labs  Lab 11/21/19 0628  AST 31  ALT 18  ALKPHOS 50  BILITOT 0.5  PROT 6.7  ALBUMIN 3.0*   No results for input(s): LIPASE, AMYLASE in the last 168 hours. No results for input(s): AMMONIA in the last 168 hours. Coagulation Profile: No results for input(s): INR, PROTIME in the last 168 hours. Cardiac Enzymes: Recent Labs  Lab 11/21/19 0628  CKTOTAL 243*   BNP (last 3 results) No results for input(s): PROBNP in the last 8760 hours. HbA1C: No results for input(s): HGBA1C in the last 72 hours. CBG: No results for input(s): GLUCAP in the last 168 hours. Lipid Profile: No results for input(s): CHOL, HDL, LDLCALC, TRIG, CHOLHDL, LDLDIRECT in the last 72 hours. Thyroid Function Tests: No results for input(s): TSH, T4TOTAL, FREET4, T3FREE, THYROIDAB in the last 72 hours. Anemia Panel: No results for input(s): VITAMINB12, FOLATE, FERRITIN, TIBC, IRON, RETICCTPCT in the last 72 hours. Urine analysis:    Component Value Date/Time   COLORURINE YELLOW 11/21/2019 [place] 11/21/2019 1244   LABSPEC 1.017 11/21/2019 1244   PHURINE  5.0 11/21/2019 [place] 11/21/2019 [place] 11/21/2019 [place] 11/21/2019 [place] 11/21/2019 1244   PROTEINUR NEGATIVE 11/21/2019 1244   UROBILINOGEN 0.2 03/28/2015 1227   NITRITE NEGATIVE 11/21/2019 1244   LEUKOCYTESUR NEGATIVE 11/21/2019 1244    Radiological Exams on Admission: DG Cervical Spine Complete  Result Date: 11/21/2019 CLINICAL DATA:  Pain following fall EXAM: CERVICAL SPINE - COMPLETE 4+ VIEW COMPARISON:  None. FINDINGS: Frontal, lateral, open-mouth odontoid, and bilateral oblique views were obtained, all with patient in cervical collar. There is no appreciable fracture or spondylolisthesis. Prevertebral soft tissues and predental space regions are normal. There is moderately severe disc space narrowing at C6-7. There is fairly mild narrowing at C5-6. There are prominent anterior osteophytes at C5, C6, and C7. There is facet hypertrophy with exit foraminal narrowing at all levels except for C2-3. Lung apices are clear. IMPRESSION: Multilevel osteoarthritic change. No fracture or spondylolisthesis. Note that no attempt to assess for potential ligamentous injury can be made with in collar only images. Electronically Signed   By: Lowella Grip III M.D.   On: 11/21/2019 08:20   DG Lumbar Spine Complete  Result Date: 11/21/2019 CLINICAL DATA:  74 year old female status post fall out of bed with low back pain radiating to the left hip. EXAM: LUMBAR SPINE - COMPLETE 4+ VIEW COMPARISON:  Recent lumbar radiographs 11/18/2019. FINDINGS: Normal lumbar segmentation aside from small unfused transverse process ossification centers at L1. Stable mild grade 1 anterolisthesis of L4 on L5. Moderate and severe lumbar facet hypertrophy, maximal at L4-L5 and L5-S1. Vacuum disc and endplate spurring at  L5-S1. Stable vertebral height and alignment. No acute osseous abnormality identified. No pars fracture. Sacrum and SI joints appear  grossly intact. Visible pelvis appears grossly intact. Negative abdominal visceral contours. IMPRESSION: 1. No acute osseous abnormality identified in the lumbar spine. 2. Degenerative grade 1 anterolisthesis of L4 on L5 with advanced facet arthropathy. Severe chronic disc degeneration at L5-S1. Electronically Signed   By: Genevie Ann M.D.   On: 11/21/2019 07:53   CT Head Wo Contrast  Result Date: 11/21/2019 CLINICAL DATA:  Fall with head trauma. EXAM: CT HEAD WITHOUT CONTRAST TECHNIQUE: Contiguous axial images were obtained from the base of the skull through the vertex without intravenous contrast. COMPARISON:  03/06/2019 FINDINGS: Brain: Remote right MCA branch infarct affecting the parietal and lateral temporal lobes primarily. Edema in the superior right frontal lobe with masslike isodense abnormality at the vertex which may be extra-axial, and measures up to 3.4 cm. The brain edema does not seem to involve cortex and is more likely vasogenic and cytotoxic. Vascular: No hyperdense vessel or unexpected calcification. Skull: Negative Sinuses/Orbits: Partial coverage of a remote blowout fracture involving the left orbital floor. These results were called by telephone at the time of interpretation on 11/21/2019 at 9:43 am to provider [facility] , who verbally acknowledged these results. IMPRESSION: 1. Large, isodense mass at the right apex with vasogenic edema. Recommend brain MRI with contrast. 2. Remote right MCA branch infarct. Electronically Signed   By: Monte Fantasia M.D.   On: 11/21/2019 09:43   MR Brain W and Wo Contrast  Result Date: 11/21/2019 CLINICAL DATA:  Fall, hypotensive EXAM: MRI HEAD WITHOUT AND WITH CONTRAST TECHNIQUE: Multiplanar, multiecho pulse sequences of the brain and surrounding structures were obtained without and with intravenous contrast. CONTRAST:  8.85mL GADAVIST GADOBUTROL 1 MMOL/ML IV SOLN COMPARISON:  03/07/2019 FINDINGS: Significant motion artifact is present. Findings below  are within this limitation. Brain: Extra-axial heterogeneously enhancing lesion is present at the vertex on the right making broad dural contact with the superior frontoparietal convexity and falx. This likely reflects progression of small extra-axial lesion seen on the 2020 study. Lesion currently measures approximately 4.5 x 3.6 x 2.4 cm. This abuts the superior sagittal sinus, which appears patent. Invasion is difficult to exclude on this motion degraded study. There is underlying parenchymal edema with mass effect on the right lateral ventricle. Sulcal T2 FLAIR hyperintensity is noted in the adjacent cortical sulci without apparent corresponding enhancement. There is a trace subdural collection along posterior right cerebral convexity measuring up to 2 mm in thickness. There is no acute infarction or intracranial hemorrhage. There is right temporal encephalomalacia reflecting chronic infarction with some susceptibility likely reflecting chronic blood products. Chronic infarct of the posterior right lentiform nucleus also with evidence of chronic blood products. There is ex vacuo dilatation of the right lateral ventricle. There is no intracranial mass, mass effect, or edema. There is no hydrocephalus or extra-axial fluid collection. Vascular: Major vessel flow voids at the skull base are preserved. Skull and upper cervical spine: Chronic depression of the floor of the left orbit. Marrow signal is grossly normal. Sinuses/Orbits: Paranasal sinuses are aerated. Orbits are unremarkable. Other: Sella is unremarkable.  Mastoid air cells are clear. IMPRESSION: Significant motion artifact. Extra-axial lesion at the vertex on the right with associated parenchymal edema and regional mass effect. Abuts superior sagittal sinus, which appears patent but invasion is difficult to exclude on this degraded study. A much smaller lesion was present in 2020 and this likely reflects  a meningioma. There is nonspecific abnormal  adjacent sulcal signal, which may reflect compression related alteration of hemodynamics. A concurrent leptomeningeal process is not excluded but unlikely unless this represents a metastasis. Trace posterior right cerebral convexity subdural collection. Additional chronic findings detailed above. Electronically Signed   By: Macy Mis M.D.   On: 11/21/2019 18:03   DG Chest Port 1 View  Result Date: 11/21/2019 CLINICAL DATA:  Fall and hypoxia EXAM: PORTABLE CHEST 1 VIEW COMPARISON:  03/03/2019 FINDINGS: Normal heart size. Prominent main pulmonary artery contour. Streaky opacity in the left mid lung where there is chronic rib deformity that is likely sequela of lung cancer treatment. There is no edema, consolidation, effusion, or pneumothorax. IMPRESSION: No acute finding or change from prior. Electronically Signed   By: Monte Fantasia M.D.   On: 11/21/2019 07:06   DG Hip Unilat W or Wo Pelvis 2-3 Views Left  Result Date: 11/21/2019 CLINICAL DATA:  Fall this morning with hip pain EXAM: DG HIP (WITH OR WITHOUT PELVIS) 2-3V LEFT COMPARISON:  03/01/2019 FINDINGS: There is no evidence of hip fracture or dislocation. There is no evidence of arthropathy or other focal bone abnormality. IMPRESSION: Negative. Electronically Signed   By: Monte Fantasia M.D.   On: 11/21/2019 07:50    EKG: Independently reviewed. See above  Assessment/Plan  Enlarging Brain lesion with vasogenic edema and Mass effect  -possible sz activity  - no AED or steroids per neuro rec at this time  -monitor on sz precautions/neuro checks -f/u with EEG in am  - f/u with neurology and neurosurgery recs   Acute Encephalopathy nos -resolved -multifactorial  -related to hypoxemia/hypercarbia /possible mild concussion s/p fall/hypotension in setting of dehydration as well as possible expanding brain lesion possible causing sz activity/postitcal state -improved s/p treatment in ed with resolution of hypoxemia/ hypotension /  hypercarbia   Acute hypoxic/hypercarbic respiratory failure on chronic hypoxic respiratory failure  -related to patient no compliance with O2  As well as period of hypoventilation due to altered mental status resulting in hypercarbia  -resolved s/p tx with O2 and resolution of hypoventilation  -continue home O2 4L   Fall ,Mechanical -no fx on noted on work up  -patient however with left sided hip and back pain  -supportive care / PT/OT  COPD /Asthma - no acute exacerbation  -continue current home regimen    Hx of PE ,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 Metastasis -await neuro surgery recs   Mild AKI on CKDIII -due to dehydration  -gently ivfs overnight  -hold nephrotoxic meds    HIV  -cont on antiretrovirals  Social  -will need Case work evaluation for home safety and placement   DVT prophylaxis:  On apixiaban  Code Status:Full  Family Communication: n/a  Disposition Plan: 24-48 hours  Consults called: Admission status: neurology Dr Lorraine Lax and neurosurgery Dr Zada Finders Inpatient    Clance Boll MD [facility]  If 7PM-7AM, please contact night-coverage [email or web address] Password  [email or web address]  11/21/2019, 8:13 PM

## 2019-11-22 ENCOUNTER — Inpatient Hospital Stay (HOSPITAL_COMMUNITY): Payer: Medicare Other

## 2019-11-22 DIAGNOSIS — J9611 Chronic respiratory failure with hypoxia: Secondary | ICD-10-CM

## 2019-11-22 DIAGNOSIS — G9389 Other specified disorders of brain: Secondary | ICD-10-CM

## 2019-11-22 DIAGNOSIS — R55 Syncope and collapse: Secondary | ICD-10-CM

## 2019-11-22 DIAGNOSIS — E876 Hypokalemia: Secondary | ICD-10-CM

## 2019-11-22 LAB — COMPREHENSIVE METABOLIC PANEL
ALT: 13 U/L (ref 0–44)
ALT: 13 U/L (ref 0–44)
AST: 18 U/L (ref 15–41)
AST: 20 U/L (ref 15–41)
Albumin: 2.1 g/dL — ABNORMAL LOW (ref 3.5–5.0)
Albumin: 2.1 g/dL — ABNORMAL LOW (ref 3.5–5.0)
Alkaline Phosphatase: 34 U/L — ABNORMAL LOW (ref 38–126)
Alkaline Phosphatase: 42 U/L (ref 38–126)
Anion gap: 8 (ref 5–15)
Anion gap: 10 (ref 5–15)
BUN: 12 mg/dL (ref 8–23)
BUN: 14 mg/dL (ref 8–23)
CO2: 18 mmol/L — ABNORMAL LOW (ref 22–32)
CO2: 22 mmol/L (ref 22–32)
Calcium: 5.9 mg/dL — CL (ref 8.9–10.3)
Calcium: 6.9 mg/dL — ABNORMAL LOW (ref 8.9–10.3)
Chloride: 120 mmol/L — ABNORMAL HIGH (ref 98–111)
Chloride: 114 mmol/L — ABNORMAL HIGH (ref 98–111)
Creatinine, Ser: 0.49 mg/dL (ref 0.44–1.00)
Creatinine, Ser: 0.54 mg/dL (ref 0.44–1.00)
GFR calc Af Amer: >60 mL/min (ref >60)
GFR calc Af Amer: >60 mL/min (ref >60)
GFR calc non Af Amer: >60 mL/min (ref >60)
GFR calc non Af Amer: >60 mL/min (ref >60)
Glucose, Bld: 76 mg/dL (ref 70–99)
Glucose, Bld: 82 mg/dL (ref 70–99)
Potassium: 2.3 mmol/L — CL (ref 3.5–5.1)
Potassium: 2.7 mmol/L — CL (ref 3.5–5.1)
Sodium: 146 mmol/L — ABNORMAL HIGH (ref 135–145)
Sodium: 146 mmol/L — ABNORMAL HIGH (ref 135–145)
Total Bilirubin: 0.4 mg/dL (ref 0.3–1.2)
Total Bilirubin: 0.1 mg/dL — ABNORMAL LOW (ref 0.3–1.2)
Total Protein: 4.7 g/dL — ABNORMAL LOW (ref 6.5–8.1)
Total Protein: 5 g/dL — ABNORMAL LOW (ref 6.5–8.1)

## 2019-11-22 LAB — CBC
HCT: 46.8 % — ABNORMAL HIGH (ref 36.0–46.0)
Hemoglobin: 13.5 g/dL (ref 12.0–15.0)
MCH: 27.9 pg (ref 26.0–34.0)
MCHC: 28.8 g/dL — ABNORMAL LOW (ref 30.0–36.0)
MCV: 96.7 fL (ref 80.0–100.0)
Platelets: 173 10*3/uL (ref 150–400)
RBC: 4.84 MIL/uL (ref 3.87–5.11)
RDW: 16 % — ABNORMAL HIGH (ref 11.5–15.5)
WBC: 4.4 10*3/uL (ref 4.0–10.5)
nRBC: 0 % (ref 0.0–0.2)

## 2019-11-22 LAB — BASIC METABOLIC PANEL
Anion gap: 6 (ref 5–15)
BUN: 13 mg/dL (ref 8–23)
CO2: 26 mmol/L (ref 22–32)
Calcium: 8.7 mg/dL — ABNORMAL LOW (ref 8.9–10.3)
Chloride: 105 mmol/L (ref 98–111)
Creatinine, Ser: 0.97 mg/dL (ref 0.44–1.00)
GFR calc Af Amer: >60 mL/min (ref >60)
GFR calc non Af Amer: 58 mL/min — ABNORMAL LOW (ref >60)
Glucose, Bld: 107 mg/dL — ABNORMAL HIGH (ref 70–99)
Potassium: 4.7 mmol/L (ref 3.5–5.1)
Sodium: 137 mmol/L (ref 135–145)

## 2019-11-22 LAB — T-HELPER CELLS (CD4) COUNT (NOT AT ARMC)
CD4 % Helper T Cell: 31 % — ABNORMAL LOW (ref 33–65)
CD4 T Cell Abs: 470 /uL (ref 400–1790)

## 2019-11-22 LAB — TROPONIN I (HIGH SENSITIVITY): Troponin I (High Sensitivity): 7 ng/L (ref <18)

## 2019-11-22 LAB — ECHOCARDIOGRAM COMPLETE

## 2019-11-22 LAB — TSH: TSH: 0.487 u[IU]/mL (ref 0.350–4.500)

## 2019-11-22 LAB — MAGNESIUM: Magnesium: 1.3 mg/dL — ABNORMAL LOW (ref 1.7–2.4)

## 2019-11-22 MED ORDER — IOHEXOL 9 MG/ML PO SOLN
500.0000 mL | ORAL | Status: AC
  Administered 2019-11-22 (×2): 500 mL via ORAL

## 2019-11-22 MED ORDER — IOHEXOL 300 MG/ML  SOLN
100.0000 mL | Freq: Once | INTRAMUSCULAR | Status: AC | PRN
  Administered 2019-11-22: 100 mL via INTRAVENOUS

## 2019-11-22 MED ORDER — MAGNESIUM SULFATE 2 GM/50ML IV SOLN
2.0000 g | Freq: Once | INTRAVENOUS | Status: AC
  Administered 2019-11-22: 2 g via INTRAVENOUS
  Filled 2019-11-22: qty 50

## 2019-11-22 MED ORDER — POTASSIUM CHLORIDE CRYS ER 20 MEQ PO TBCR
40.0000 meq | EXTENDED_RELEASE_TABLET | ORAL | Status: AC
  Administered 2019-11-22 (×2): 40 meq via ORAL
  Filled 2019-11-22 (×2): qty 2

## 2019-11-22 MED ORDER — HYDROCODONE-ACETAMINOPHEN 5-325 MG PO TABS
1.0000 | ORAL_TABLET | Freq: Four times a day (QID) | ORAL | Status: DC | PRN
  Administered 2019-11-22 – 2019-11-28 (×15): 1 via ORAL
  Filled 2019-11-22 (×15): qty 1

## 2019-11-22 NOTE — Progress Notes (Signed)
Patient tranferred to unit via hospital bed. Patient is alert and oriented x 4, and denies pain. Call bell and phone within reach. Seizure and falls precautions applied.

## 2019-11-22 NOTE — Consult Note (Signed)
Neurosurgery Consultation  Reason for Consult: Intracranial tumor Referring Physician: [place] Pink  CC: Found down  HPI: This is a 74 y.o. 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 Pt doesn't recall any aura or focal seizure-like activity recently or prior to the fall, feels the fall was mechanical in nature - that her walker just kind of left her and wheeled away. She was down for hours and came in hypotensive and mildly hypoxemic. She is on apixiban for multiple indications as above.   ROS: A 14 point ROS was performed and is negative except as noted in the HPI.   PMHx:  Past Medical History:  Diagnosis Date  . Asthma   . Atypical squamous cells of undetermined significance (ASCUS) on Papanicolaou smear of cervix   . Bed bug bite 07/12/2019  . COPD (chronic obstructive pulmonary disease) ([place])   . H/O drainage of abscess    left axilla, from left breast  . Hep B w/o coma   . HIV (human immunodeficiency virus infection) ([place])   . Hyperlipidemia   . Lung cancer ([place]) 12/14/13   LUL Adenocarcinoma  . Neuromuscular disorder ([place])    fingers/feet neuropathy  . Non-small cell lung cancer ([place])   . S/P radiation therapy 01/26/14-02/02/14   sbrt  lt upper lung-54Gy/78fx   FamHx:  Family History  Problem Relation Age of Onset  . Pneumonia Mother   . Hypertension Sister   . Diabetes Sister   . Cancer Sister        pancreatic and lung  . Cancer Brother        pancreatic  . Cancer Brother        pancreatic   SocHx:  reports that she has been smoking cigarettes. She has a 40.00 pack-year smoking history. She has never used smokeless tobacco. She reports that she does not drink alcohol or use drugs.  Exam: Vital signs in last 24 hours: Pulse Rate:  [70-87] 81 (05/26 0325) Resp:  [12-22] 17 (05/26 0325) BP: (101-164)/(43-99) 148/99 (05/26  0310) SpO2:  [90 %-99 %] 94 % (05/26 0325) General: Awake, alert, cooperative, lying in bed in NAD Head: Normocephalic and atruamatic HEENT: Neck supple Pulmonary: breathing supplemental O2 comfortably, no evidence of increased work of breathing Cardiac: RRR Abdomen: S NT ND Extremities: Warm and well perfused x4 Neuro: AOx3, PERRL, EOMI, FS Strength 5/5 on R, 4+/5 on L with poor coordination - difficulty doing drift testing. SILTx4   Assessment and Plan: 74 y.o. woman found down at home with multiple significant comorbidities as listed above. MRI brain w/wo personally reviewed, which shows 3.5x2.4cm in the right convexity with a cystic region medially, no reactive changes in the adjacent bone, significant associated vasogenic edema. Of note, lesion is visible on MRI 8 months ago. At that time, it was only 1.5cm and has significantly enlarged since then, it also had significant edema at that time, despite the lesion's small size.  -This would be atypical imaging for a meningioma. 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 Given her history, it is concerning for recurrent lung Ca. Given her extensive co-morbidities, recommend repeat staging and further workup to see if we can diagnose what this may be and avoid surgery to obtain a diagnosis. In the case it is a meningioma, the  rapid growth rate in just a few months means it would need to be treated. Will discuss with the patient, her peri-op thrombotic risk is extremely high. -recommend metastatic workup to evaluate for recurrence of lung cancer, will discuss w/ neuro-oncology about further diagnostic options and update recs -discussed above w/ pt, she knows she has a brain tumor of unknown pathology and that the goal is to obtain a diagnosis without surgery, but surgery may be required and she is high risk for ocmplications -will continue to follow  [name],  MD 11/22/19 6:58 AM [facility]

## 2019-11-22 NOTE — ED Notes (Signed)
EEG setting up in room

## 2019-11-22 NOTE — ED Notes (Signed)
Paged admitting in regards to critical labs K 2.3 Ca 5.9

## 2019-11-22 NOTE — ED Notes (Signed)
Missing Night time meds from main pharmacy secure message sent requesting medications

## 2019-11-22 NOTE — Progress Notes (Signed)
  Echocardiogram 2D Echocardiogram has been performed.  [name] 11/22/2019, 2:53 PM

## 2019-11-22 NOTE — ED Notes (Signed)
Phlebotomy requested to redraw labs

## 2019-11-22 NOTE — Procedures (Signed)
Patient Name: EDDA OREA  MRN: [id number]  Epilepsy Attending: Lora Havens  Referring Physician/Provider: Dr. Clarise Cruz- Roda Shutters Date: 11/22/2019 Duration: 23.46 minutes  Patient history: 74 year old female who presented after being found down.  EEG evaluate for seizures.  Level of alertness: Awake, drowsy, sleep, comatose, lethargic  AEDs during EEG study: Gabapentin  Technical aspects: This EEG study was done with scalp electrodes positioned according to the 10-20 International system of electrode placement. Electrical activity was acquired at a sampling rate of 500Hz  and reviewed with a high frequency filter of 70Hz  and a low frequency filter of 1Hz . EEG data were recorded continuously and digitally stored.   Description: The posterior dominant rhythm consists of 8-9 Hz activity of moderate voltage (25-35 uV) seen predominantly in posterior head regions, symmetric and reactive to eye opening and eye closing. Hyperventilation and photic stimulation were not performed.     IMPRESSION: This study is within normal limits. No seizures or epileptiform discharges were seen throughout the recording.  Johnmark Geiger Barbra Sarks

## 2019-11-22 NOTE — ED Notes (Signed)
Lab to result repeat CMP

## 2019-11-22 NOTE — ED Notes (Signed)
Admitting provider at bedside.

## 2019-11-22 NOTE — Progress Notes (Signed)
EEG completed, results pending. 

## 2019-11-22 NOTE — Progress Notes (Signed)
[facility] PROGRESS NOTE   [name]:[id number] DOB: 04/21/1946 DOA: 11/21/2019  PCP: Nolene Ebbs, MD  Brief History/Interval Summary: [name] is a 74 y.o. 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 Per ems patient was found face down, but noted to able to give some history of her fall and noted complaints of pain on her left hip and back and denied any LOC at that time. Per EMS patient was found to be unkempt and her room was note to be squalid condition. At the scene her blood pressure as 80/50, sat was 87% she was off her home O2.She was treated with 500 ns en route.  ED Course:  In the ED patient was noted to be lethargic and was unable to give history initial and worsen ensured related to change in mental status.    Evaluation was positive for a brain lesion.  Patient was hospitalized for further management.   Reason for Visit: Fall, lethargy  Consultants: Neurosurgery. Phone conversation with neurology  Procedures: EEG  Antibiotics: Anti-infectives (From admission, onward)   Start     Dose/Rate Route Frequency Ordered Stop   11/21/19 2000  bictegravir-emtricitabine-tenofovir AF (BIKTARVY) 50-200-25 MG per tablet 1 tablet     1 tablet Oral Daily 11/21/19 1953        Subjective/Interval History: Patient is seen in the emergency department.  She was complaining of pain in the ribs secondary to the fall.  Denies any shortness of breath.  No dizziness or  lightheadedness.  She is aware of the lesion in her brain.  ROS: No nausea or vomiting    Assessment/Plan:  Enlarging Brain lesion with vasogenic edema and Mass effect  There is concern for seizure activity.  Based on H&P this issue was discussed with a neurologist who did not recommend steroids or antiepileptic drugs at this time.  Neurosurgery has seen the patient.  EEG is pending.  Neurosurgery has recommended staging work-up due to patient's previous history of lung cancer.  Patient previously seen by Dr. [name].  We will go ahead and do a CT scan of the chest abdomen and pelvis.  PT and OT evaluation.  Acute Encephalopathy, other Seems to be back to her baseline.  Could be secondary to fall, dehydration, brain lesion.  Possible seizure activity.   Acute hypoxic/hypercarbic respiratory failure on chronic hypoxic respiratory failure  Possibly related to noncompliance with home oxygen.  Seems to have resolved now.  She is on home oxygen at 3 to 4 L/min.  Hx of Lung CA  This involved her left upper lobe.  Appears that she underwent surgery and radiation treatment.  Mild AKI on CKDIII Resolved with IV hydration.  Hypokalemia Potassium level noted to be low this morning.  Will be repleted.  Check magnesium.  HIV  Continue antiretrovirals.  Fall ,Mechanical No injuries noted on work-up.-no fx on noted on work up.  History of COPD /Asthma Seems to be stable.  Continue home medication regimen.  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  This is being continued.  Hx of CVA, nonhemorrhagic 9/20 Sequela of left sided weakness. Continue apixiaban.   History of essential hypertension Patient noted to be hypotensive on admission.  Blood pressure medications ordered currently.  Noted to be hypertensive this morning.  Consider reinitiating some of her medications.  OSA  CPAP qhs   Chronic  Tobacco abuse  Patient continue to smoke daily. Nicotine patch /encourage cessation    DVT Prophylaxis: Apixaban Code Status: Full code Family Communication: Discussed with the patient.  No family at bedside. Disposition Plan:  Status is: Inpatient  Remains inpatient appropriate because:Ongoing diagnostic testing needed not appropriate for outpatient work up   Dispo: The patient is from: Home              Anticipated d/c is to: To be determined              Anticipated d/c date is: 2 days              Patient currently is not medically stable to d/c.    Medications:  Scheduled: . apixaban  5 mg Oral BID  . bictegravir-emtricitabine-tenofovir AF  1 tablet Oral Daily  . fluticasone furoate-vilanterol  1 puff Inhalation Daily  . gabapentin  800 mg Oral TID  . loratadine  10 mg Oral BID  . montelukast  10 mg Oral QHS  . potassium chloride  40 mEq Oral Q4H  . QUEtiapine Fumarate  150 mg Oral QHS  . rOPINIRole  1 mg Oral QHS  . sodium chloride flush  3 mL Intravenous Once  . umeclidinium bromide  1 puff Inhalation Daily  . venlafaxine XR  150 mg Oral Daily  . vitamin A  10,000 Units Oral Daily  . vitamin C  125 mg Oral Daily   Continuous: . sodium chloride 75 mL/hr at 11/22/19 1001  . sodium chloride     VHQ:IONGEXBMWUXLK **OR** acetaminophen, albuterol, diclofenac Sodium, HYDROcodone-acetaminophen, LORazepam, ondansetron **OR** ondansetron (ZOFRAN) IV, polyethylene glycol   Objective:  Vital Signs  Vitals:   11/22/19 0745 11/22/19 0800 11/22/19 0900 11/22/19 1000  BP:  (!) 161/73 (!) 152/77   Pulse: 81 80 79 80  Resp: 17 14 13 15   Temp:      TempSrc:      SpO2: 93% 91% 94% 91%    Intake/Output Summary (Last 24 hours) at 11/22/2019 1040 Last data filed at 11/21/2019 2200 Gross per 24 hour  Intake -  Output 1000 ml  Net -1000 ml   There were no vitals filed for this visit.  General appearance: Awake alert.  In no distress Resp: Clear to auscultation  bilaterally.  Normal effort No bruising noted over the left chest wall Cardio: S1-S2 is normal regular.  No S3-S4.  No rubs murmurs or bruit GI: Abdomen is soft.  Nontender nondistended.  Bowel sounds are present normal.  No masses organomegaly Extremities: No edema.  Able to move all extremities Neurologic: Alert and oriented x3.  No focal neurological deficits.    Lab Results:  Data Reviewed: I have personally reviewed following labs and imaging studies  CBC: Recent Labs  Lab 11/21/19 0628 11/21/19 0919 11/21/19 2016 11/22/19 0220  WBC 6.2  --   --  4.4  HGB 14.3 13.6 16.0* 13.5  HCT 48.3* 40.0 47.0* 46.8*  MCV 94.3  --   --  96.7  PLT 208  --   --  173  Basic Metabolic Panel: Recent Labs  Lab 11/21/19 0628 11/21/19 0919 11/21/19 2016 11/22/19 0220 11/22/19 0809  NA 146* 147* 145 146* 146*  K 3.7 3.3* 3.6 2.3* 2.7*  CL 109  --   --  120* 114*  CO2 26  --   --  18* 22  GLUCOSE 81  --   --  76 82  BUN 30*  --   --  12 14  CREATININE 1.18*  --   --  0.49 0.54  CALCIUM 8.6*  --   --  5.9* 6.9*    GFR: CrCl cannot be calculated (Unknown ideal weight.).  Liver Function Tests: Recent Labs  Lab 11/21/19 0628 11/22/19 0220 11/22/19 0809  AST 31 18 20   ALT 18 13 13   ALKPHOS 50 34* 42  BILITOT 0.5 0.4 0.1*  PROT 6.7 4.7* 5.0*  ALBUMIN 3.0* 2.1* 2.1*     Cardiac Enzymes: Recent Labs  Lab 11/21/19 0628  CKTOTAL 243*     Thyroid Function Tests: Recent Labs    11/22/19 0220  TSH 0.487      Recent Results (from the past 240 hour(s))  SARS Coronavirus 2 by RT PCR (hospital order, performed in [place] hospital lab) Nasopharyngeal Nasopharyngeal Swab     Status: None   Collection Time: 11/21/19 12:32 PM   Specimen: Nasopharyngeal Swab  Result Value Ref Range Status   SARS Coronavirus 2 NEGATIVE NEGATIVE Final    Comment: (NOTE) SARS-CoV-2 target nucleic acids are NOT DETECTED. The SARS-CoV-2 RNA is generally detectable in upper and lower  respiratory specimens during the acute phase of infection. The lowest concentration of SARS-CoV-2 viral copies this assay can detect is 250 copies / mL. A negative result does not preclude SARS-CoV-2 infection and should not be used as the sole basis for treatment or other patient management decisions.  A negative result may occur with improper specimen collection / handling, submission of specimen other than nasopharyngeal swab, presence of viral mutation(s) within the areas targeted by this assay, and inadequate number of viral copies (<250 copies / mL). A negative result must be combined with clinical observations, patient history, and epidemiological information. Fact Sheet for Patients:   StrictlyIdeas.no Fact Sheet for Healthcare Providers: BankingDealers.co.za This test is not yet approved or cleared  by the [place] FDA and has been authorized for detection and/or diagnosis of SARS-CoV-2 by FDA under an Emergency Use Authorization (EUA).  This EUA will remain in effect (meaning this test can be used) for the duration of the COVID-19 declaration under Section 564(b)(1) of the Act, 21 U.S.C. section 360bbb-3(b)(1), unless the authorization is terminated or revoked sooner. Performed at [facility], [place] [address], [place], [place] [address]       Radiology Studies: DG Cervical Spine Complete  Result Date: 11/21/2019 CLINICAL DATA:  Pain following fall EXAM: CERVICAL SPINE - COMPLETE 4+ VIEW COMPARISON:  None. FINDINGS: Frontal, lateral, open-mouth odontoid, and bilateral oblique views were obtained, all with patient in cervical collar. There is no appreciable fracture or spondylolisthesis. Prevertebral soft tissues and predental space regions are normal. There is moderately severe disc space narrowing at C6-7. There is fairly mild narrowing at C5-6. There are prominent anterior osteophytes at C5, C6, and C7. There is facet  hypertrophy with exit foraminal narrowing at all levels except for C2-3. Lung apices are clear. IMPRESSION: Multilevel osteoarthritic change. No fracture or spondylolisthesis. Note that no attempt to assess for potential ligamentous injury can be made with in  collar only images. Electronically Signed   By: Lowella Grip III M.D.   On: 11/21/2019 08:20   DG Lumbar Spine Complete  Result Date: 11/21/2019 CLINICAL DATA:  74 year old female status post fall out of bed with low back pain radiating to the left hip. EXAM: LUMBAR SPINE - COMPLETE 4+ VIEW COMPARISON:  Recent lumbar radiographs 11/18/2019. FINDINGS: Normal lumbar segmentation aside from small unfused transverse process ossification centers at L1. Stable mild grade 1 anterolisthesis of L4 on L5. Moderate and severe lumbar facet hypertrophy, maximal at L4-L5 and L5-S1. Vacuum disc and endplate spurring at T0-Z6. Stable vertebral height and alignment. No acute osseous abnormality identified. No pars fracture. Sacrum and SI joints appear grossly intact. Visible pelvis appears grossly intact. Negative abdominal visceral contours. IMPRESSION: 1. No acute osseous abnormality identified in the lumbar spine. 2. Degenerative grade 1 anterolisthesis of L4 on L5 with advanced facet arthropathy. Severe chronic disc degeneration at L5-S1. Electronically Signed   By: Genevie Ann M.D.   On: 11/21/2019 07:53   CT Head Wo Contrast  Result Date: 11/21/2019 CLINICAL DATA:  Fall with head trauma. EXAM: CT HEAD WITHOUT CONTRAST TECHNIQUE: Contiguous axial images were obtained from the base of the skull through the vertex without intravenous contrast. COMPARISON:  03/06/2019 FINDINGS: Brain: Remote right MCA branch infarct affecting the parietal and lateral temporal lobes primarily. Edema in the superior right frontal lobe with masslike isodense abnormality at the vertex which may be extra-axial, and measures up to 3.4 cm. The brain edema does not seem to involve cortex and  is more likely vasogenic and cytotoxic. Vascular: No hyperdense vessel or unexpected calcification. Skull: Negative Sinuses/Orbits: Partial coverage of a remote blowout fracture involving the left orbital floor. These results were called by telephone at the time of interpretation on 11/21/2019 at 9:43 am to provider [facility] , who verbally acknowledged these results. IMPRESSION: 1. Large, isodense mass at the right apex with vasogenic edema. Recommend brain MRI with contrast. 2. Remote right MCA branch infarct. Electronically Signed   By: Monte Fantasia M.D.   On: 11/21/2019 09:43   MR Brain W and Wo Contrast  Result Date: 11/21/2019 CLINICAL DATA:  Fall, hypotensive EXAM: MRI HEAD WITHOUT AND WITH CONTRAST TECHNIQUE: Multiplanar, multiecho pulse sequences of the brain and surrounding structures were obtained without and with intravenous contrast. CONTRAST:  8.66mL GADAVIST GADOBUTROL 1 MMOL/ML IV SOLN COMPARISON:  03/07/2019 FINDINGS: Significant motion artifact is present. Findings below are within this limitation. Brain: Extra-axial heterogeneously enhancing lesion is present at the vertex on the right making broad dural contact with the superior frontoparietal convexity and falx. This likely reflects progression of small extra-axial lesion seen on the 2020 study. Lesion currently measures approximately 4.5 x 3.6 x 2.4 cm. This abuts the superior sagittal sinus, which appears patent. Invasion is difficult to exclude on this motion degraded study. There is underlying parenchymal edema with mass effect on the right lateral ventricle. Sulcal T2 FLAIR hyperintensity is noted in the adjacent cortical sulci without apparent corresponding enhancement. There is a trace subdural collection along posterior right cerebral convexity measuring up to 2 mm in thickness. There is no acute infarction or intracranial hemorrhage. There is right temporal encephalomalacia reflecting chronic infarction with some susceptibility  likely reflecting chronic blood products. Chronic infarct of the posterior right lentiform nucleus also with evidence of chronic blood products. There is ex vacuo dilatation of the right lateral ventricle. There is no intracranial mass, mass effect, or edema. There is no hydrocephalus  or extra-axial fluid collection. Vascular: Major vessel flow voids at the skull base are preserved. Skull and upper cervical spine: Chronic depression of the floor of the left orbit. Marrow signal is grossly normal. Sinuses/Orbits: Paranasal sinuses are aerated. Orbits are unremarkable. Other: Sella is unremarkable.  Mastoid air cells are clear. IMPRESSION: Significant motion artifact. Extra-axial lesion at the vertex on the right with associated parenchymal edema and regional mass effect. Abuts superior sagittal sinus, which appears patent but invasion is difficult to exclude on this degraded study. A much smaller lesion was present in 2020 and this likely reflects a meningioma. There is nonspecific abnormal adjacent sulcal signal, which may reflect compression related alteration of hemodynamics. A concurrent leptomeningeal process is not excluded but unlikely unless this represents a metastasis. Trace posterior right cerebral convexity subdural collection. Additional chronic findings detailed above. Electronically Signed   By: Macy Mis M.D.   On: 11/21/2019 18:03   DG Chest Port 1 View  Result Date: 11/21/2019 CLINICAL DATA:  Fall and hypoxia EXAM: PORTABLE CHEST 1 VIEW COMPARISON:  03/03/2019 FINDINGS: Normal heart size. Prominent main pulmonary artery contour. Streaky opacity in the left mid lung where there is chronic rib deformity that is likely sequela of lung cancer treatment. There is no edema, consolidation, effusion, or pneumothorax. IMPRESSION: No acute finding or change from prior. Electronically Signed   By: Monte Fantasia M.D.   On: 11/21/2019 07:06   DG Hip Unilat W or Wo Pelvis 2-3 Views Left  Result  Date: 11/21/2019 CLINICAL DATA:  Fall this morning with hip pain EXAM: DG HIP (WITH OR WITHOUT PELVIS) 2-3V LEFT COMPARISON:  03/01/2019 FINDINGS: There is no evidence of hip fracture or dislocation. There is no evidence of arthropathy or other focal bone abnormality. IMPRESSION: Negative. Electronically Signed   By: Monte Fantasia M.D.   On: 11/21/2019 07:50       LOS: 1 day   [facility] Pager on [email or web address]  11/22/2019, 10:40 AM

## 2019-11-22 NOTE — ED Notes (Signed)
Pt incontinent of urine and pure wick not in correct place. Pt's bed changed and pt cleaned. Placed clean pure wick and brief on patient. Pt request to sit on side of bed but verbalizes she will not try and get up without calling first.

## 2019-11-23 ENCOUNTER — Inpatient Hospital Stay (HOSPITAL_COMMUNITY): Payer: Medicare Other

## 2019-11-23 LAB — BASIC METABOLIC PANEL
Anion gap: 12 (ref 5–15)
BUN: 11 mg/dL (ref 8–23)
CO2: 27 mmol/L (ref 22–32)
Calcium: 9.1 mg/dL (ref 8.9–10.3)
Chloride: 101 mmol/L (ref 98–111)
Creatinine, Ser: 0.81 mg/dL (ref 0.44–1.00)
GFR calc Af Amer: >60 mL/min (ref >60)
GFR calc non Af Amer: >60 mL/min (ref >60)
Glucose, Bld: 96 mg/dL (ref 70–99)
Potassium: 4.5 mmol/L (ref 3.5–5.1)
Sodium: 140 mmol/L (ref 135–145)

## 2019-11-23 LAB — CBC
HCT: 53.6 % — ABNORMAL HIGH (ref 36.0–46.0)
Hemoglobin: 16 g/dL — ABNORMAL HIGH (ref 12.0–15.0)
MCH: 28 pg (ref 26.0–34.0)
MCHC: 29.9 g/dL — ABNORMAL LOW (ref 30.0–36.0)
MCV: 93.9 fL (ref 80.0–100.0)
Platelets: 177 10*3/uL (ref 150–400)
RBC: 5.71 MIL/uL — ABNORMAL HIGH (ref 3.87–5.11)
RDW: 15.9 % — ABNORMAL HIGH (ref 11.5–15.5)
WBC: 4.1 10*3/uL (ref 4.0–10.5)
nRBC: 0 % (ref 0.0–0.2)

## 2019-11-23 LAB — MAGNESIUM: Magnesium: 1.9 mg/dL (ref 1.7–2.4)

## 2019-11-23 MED ORDER — HEPARIN (PORCINE) 25000 UT/250ML-% IV SOLN
1200.0000 [IU]/h | INTRAVENOUS | Status: DC
  Administered 2019-11-24 – 2019-11-27 (×5): 1200 [IU]/h via INTRAVENOUS
  Filled 2019-11-23 (×4): qty 250

## 2019-11-23 NOTE — Progress Notes (Addendum)
[facility] PROGRESS NOTE   [name] NIO:[id number] DOB: 06-19-1946 DOA: 11/21/2019  PCP: Nolene Ebbs, MD  Brief History/Interval Summary: [name] is a 74 y.o. 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 Per ems patient was found face down, but noted to able to give some history of her fall and noted complaints of pain on her left hip and back and denied any LOC at that time. Per EMS patient was found to be unkempt and her room was note to be squalid condition. At the scene her blood pressure as 80/50, sat was 87% she was off her home O2.She was treated with 500 ns en route.  ED Course:  In the ED patient was noted to be lethargic and was unable to give history initial and worsen ensured related to change in mental status.    Evaluation was positive for a brain lesion.  Patient was hospitalized for further management.   Consultants: Neurosurgery. Phone conversation with neurology  Procedures: EEG  Antibiotics: Anti-infectives (From admission, onward)   Start     Dose/Rate Route Frequency Ordered Stop   11/21/19 2000  bictegravir-emtricitabine-tenofovir AF (BIKTARVY) 50-200-25 MG per tablet 1 tablet     1 tablet Oral Daily 11/21/19 1953        Subjective/Interval History: Patient complaining of abdominal fullness after eating her breakfast.  Denies any nausea.  Denies any abdominal pain.  No headaches.  No vision difficulties.  Denies any weakness and her arms or legs  at this time.  ROS: Denies any shortness of breath.    Assessment/Plan:  Enlarging Brain lesion with vasogenic edema and Mass effect  There was concern for seizure activity.  Based on H&P this issue was discussed with a neurologist who did not recommend steroids or antiepileptic drugs at this time.  EEG did not show any epileptiform activity. Neurosurgery is following.  They recommended staging work-up due to patient's previous history of lung cancer.  Patient previously seen by Dr. [name].  CT scan of the chest abdomen pelvis does not show any evidence for either primary tumor or metastatic process.   Neurosurgery is planning an operative resection.  This will apparently be done on Tuesday.  Patient however will need to stay in the hospital for the duration as she is not thought to be safe to return home and come back as an outpatient.  Her anticoagulation will need to be discontinued for the surgery.  Will discuss with neurology.  See below. PT and OT evaluation.  Acute Encephalopathy, other Seems to be back to her baseline.  Could be secondary to fall, dehydration, brain lesion.  Acute hypoxic/hypercarbic respiratory failure on chronic hypoxic respiratory failure  Possibly related to noncompliance with home oxygen.  Seems to have resolved now.  She is on home oxygen at 3 to 4 L/min.  Hx of Lung CA  This involved her left upper lobe.  Appears that she underwent surgery and radiation treatment.  No evidence for recurrence on CT scan.  Mild AKI on CKDIII Resolved with IV  hydration.  Hypokalemia Aggressively repleted.  Corrected.  Magnesium was also repleted.  Normal this morning.    HIV  Continue antiretrovirals.  Fall ,Mechanical No injuries noted on work-up.-no fx on noted on work up.  History of COPD /Asthma Seems to be stable.  Continue home medication regimen.  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  This is being continued.  However this will need to be discontinued before surgery.  Since this was likely reason for her stroke we will discuss with neurology.  The stroke was in September 2020.  May need to bridge her.  Discussed with Dr. Leonie Man with the stroke service.  He recommends stopping apixaban and bridging with IV heparin about 3 to 4 days prior to surgery.  Heparin to be discontinued at least 1 hour prior to surgery but could be more than an hour prior as per the neurosurgeon.  And then anticoagulation to be reinitiated after surgery based on neurosurgery input.  Hx of CVA, nonhemorrhagic 9/20 Sequela of left sided weakness.  Patient on apixaban.  See above.   History of essential hypertension Patient's blood pressure noted to be high at times.  She is not on any antihypertensives at home.  Continue to monitor.  Will initiate medications if she is persistently hypertensive.   OSA  CPAP qhs   Chronic Tobacco abuse  Patient continue to smoke daily. Nicotine patch /encourage cessation    DVT Prophylaxis: Apixaban Code Status: Full code Family Communication: Discussed with the patient.  No family at bedside. Disposition Plan:  Status is: Inpatient  Remains inpatient appropriate because:Ongoing diagnostic testing needed not appropriate for outpatient work up   Dispo: The patient is from: Home              Anticipated d/c is to: To be determined              Anticipated d/c date is: Next week after surgery              Patient currently is not medically stable to d/c.    Medications:  Scheduled: . apixaban  5 mg Oral BID  . bictegravir-emtricitabine-tenofovir AF  1 tablet Oral Daily  . fluticasone furoate-vilanterol  1 puff Inhalation Daily  . gabapentin  800 mg Oral TID  . loratadine  10 mg Oral BID  . montelukast  10 mg Oral QHS  . QUEtiapine Fumarate  150 mg Oral QHS  . rOPINIRole  1 mg Oral QHS  . sodium chloride flush  3  mL Intravenous Once  . umeclidinium bromide  1 puff Inhalation Daily  . venlafaxine XR  150 mg Oral Daily  . vitamin C  125 mg Oral Daily   Continuous: . sodium chloride     JME:[id number] **OR** acetaminophen, albuterol, diclofenac Sodium, HYDROcodone-acetaminophen, LORazepam, ondansetron **OR** ondansetron (ZOFRAN) IV, polyethylene glycol   Objective:  Vital Signs  Vitals:   11/22/19 2228 11/22/19 2325 11/23/19 0344 11/23/19 0755  BP:  (!) 142/78 (!) 164/90 (!) 149/94  Pulse: 76 77 80 76  Resp: 16 17 20 17   Temp:  98.1 F (36.7 C) 97.6 F (36.4 C) 98.3 F (36.8 C)  TempSrc:   Oral Oral  SpO2: 98% 98% 93% 95%  Weight:      Height:        Intake/Output Summary (Last 24 hours) at 11/23/2019 1058 Last data filed at 11/22/2019 1945 Gross per 24  hour  Intake --  Output 1400 ml  Net -1400 ml   Filed Weights   11/22/19 1900  Weight: 73.5 kg    General appearance: Awake alert.  In no distress Resp: Clear to auscultation bilaterally.  Normal effort Cardio: S1-S2 is normal regular.  No S3-S4.  No rubs murmurs or bruit GI: Abdomen is soft.  Nontender nondistended.  Bowel sounds are present normal.  No masses organomegaly Extremities: No edema.  Full range of motion of lower extremities. Neurologic: Alert and oriented x3.  No focal neurological deficits.     Lab Results:  Data Reviewed: I have personally reviewed following labs and imaging studies  CBC: Recent Labs  Lab 11/21/19 0628 11/21/19 0919 11/21/19 2016 11/22/19 0220 11/23/19 0331  WBC 6.2  --   --  4.4 4.1  HGB 14.3 13.6 16.0* 13.5 16.0*  HCT 48.3* 40.0 47.0* 46.8* 53.6*  MCV 94.3  --   --  96.7 93.9  PLT 208  --   --  173 947    Basic Metabolic Panel: Recent Labs  Lab 11/21/19 0628 11/21/19 0919 11/21/19 2016 11/22/19 0220 11/22/19 0809 11/22/19 0856 11/22/19 1819 11/23/19 0331  NA 146*   < > 145 146* 146*  --  137 140  K 3.7   < > 3.6 2.3* 2.7*  --  4.7 4.5  CL 109  --   --  120*  114*  --  105 101  CO2 26  --   --  18* 22  --  26 27  GLUCOSE 81  --   --  76 82  --  107* 96  BUN 30*  --   --  12 14  --  13 11  CREATININE 1.18*  --   --  0.49 0.54  --  0.97 0.81  CALCIUM 8.6*  --   --  5.9* 6.9*  --  8.7* 9.1  MG  --   --   --   --   --  1.3*  --  1.9   < > = values in this interval not displayed.    GFR: Estimated Creatinine Clearance: 65.6 mL/min (by C-G formula based on SCr of 0.81 mg/dL).  Liver Function Tests: Recent Labs  Lab 11/21/19 0628 11/22/19 0220 11/22/19 0809  AST 31 18 20   ALT 18 13 13   ALKPHOS 50 34* 42  BILITOT 0.5 0.4 0.1*  PROT 6.7 4.7* 5.0*  ALBUMIN 3.0* 2.1* 2.1*     Cardiac Enzymes: Recent Labs  Lab 11/21/19 0628  CKTOTAL 243*     Thyroid Function Tests: Recent Labs    11/22/19 0220  TSH 0.487      Recent Results (from the past 240 hour(s))  Blood culture (routine x 2)     Status: None (Preliminary result)   Collection Time: 11/21/19 10:30 AM   Specimen: BLOOD LEFT ARM  Result Value Ref Range Status   Specimen Description BLOOD LEFT ARM  Final   Special Requests   Final    BOTTLES DRAWN AEROBIC AND ANAEROBIC Blood Culture results may not be optimal due to an inadequate volume of blood received in culture bottles   Culture   Final    NO GROWTH 2 DAYS Performed at [facility], [place] [address], [place], [place] [address]    Report Status PENDING  Incomplete  SARS Coronavirus 2 by RT PCR (hospital order, performed in [facility] hospital lab) Nasopharyngeal Nasopharyngeal Swab     Status:  None   Collection Time: 11/21/19 12:32 PM   Specimen: Nasopharyngeal Swab  Result Value Ref Range Status   SARS Coronavirus 2 NEGATIVE NEGATIVE Final    Comment: (NOTE) SARS-CoV-2 target nucleic acids are NOT DETECTED. The SARS-CoV-2 RNA is generally detectable in upper and lower respiratory specimens during the acute phase of infection. The lowest concentration of SARS-CoV-2 viral copies this assay can detect is  250 copies / mL. A negative result does not preclude SARS-CoV-2 infection and should not be used as the sole basis for treatment or other patient management decisions.  A negative result may occur with improper specimen collection / handling, submission of specimen other than nasopharyngeal swab, presence of viral mutation(s) within the areas targeted by this assay, and inadequate number of viral copies (<250 copies / mL). A negative result must be combined with clinical observations, patient history, and epidemiological information. Fact Sheet for Patients:   StrictlyIdeas.no Fact Sheet for Healthcare Providers: BankingDealers.co.za This test is not yet approved or cleared  by the [place] FDA and has been authorized for detection and/or diagnosis of SARS-CoV-2 by FDA under an Emergency Use Authorization (EUA).  This EUA will remain in effect (meaning this test can be used) for the duration of the COVID-19 declaration under Section 564(b)(1) of the Act, 21 U.S.C. section 360bbb-3(b)(1), unless the authorization is terminated or revoked sooner. Performed at [facility], [place] [address], [place], [place] [address]   Blood culture (routine x 2)     Status: None (Preliminary result)   Collection Time: 11/21/19  5:49 PM   Specimen: BLOOD  Result Value Ref Range Status   Specimen Description BLOOD SITE NOT SPECIFIED  Final   Special Requests   Final    BOTTLES DRAWN AEROBIC AND ANAEROBIC Blood Culture results may not be optimal due to an inadequate volume of blood received in culture bottles   Culture   Final    NO GROWTH 2 DAYS Performed at [facility], [place] [address], [place], [place] [address]    Report Status PENDING  Incomplete      Radiology Studies: EEG  Result Date: 11/22/2019 Lora Havens, MD     11/22/2019 11:53 AM Patient Name: BRELAND ELDERS MRN: [id number] Epilepsy Attending: Lora Havens  Referring Physician/Provider: Dr. Clarise Cruz- Roda Shutters Date: 11/22/2019 Duration: 23.46 minutes Patient history: 74 year old female who presented after being found down.  EEG evaluate for seizures. Level of alertness: Awake, drowsy, sleep, comatose, lethargic AEDs during EEG study: Gabapentin Technical aspects: This EEG study was done with scalp electrodes positioned according to the 10-20 International system of electrode placement. Electrical activity was acquired at a sampling rate of 500Hz  and reviewed with a high frequency filter of 70Hz  and a low frequency filter of 1Hz . EEG data were recorded continuously and digitally stored. Description: The posterior dominant rhythm consists of 8-9 Hz activity of moderate voltage (25-35 uV) seen predominantly in posterior head regions, symmetric and reactive to eye opening and eye closing. Hyperventilation and photic stimulation were not performed.   IMPRESSION: This study is within normal limits. No seizures or epileptiform discharges were seen throughout the recording. Lora Havens   CT CHEST W CONTRAST  Result Date: 11/22/2019 CLINICAL DATA:  Brain neoplasm, staging EXAM: CT CHEST, ABDOMEN, AND PELVIS WITH CONTRAST TECHNIQUE: Multidetector CT imaging of the chest, abdomen and pelvis was performed following the standard protocol during bolus administration of intravenous contrast. CONTRAST:  157mL OMNIPAQUE IOHEXOL 300 MG/ML SOLN,  additional oral enteric contrast COMPARISON:  CT chest abdomen pelvis, 03/01/2019 FINDINGS: CT CHEST FINDINGS Cardiovascular: Aortic atherosclerosis. 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 Unchanged enlargement of the descending thoracic aorta, measuring up to 3.4 x 3.4 cm near the diaphragm. Normal heart size. Enlargement of the main pulmonary artery up to 3.8 cm in caliber. No pericardial effusion. Mediastinum/Nodes: No enlarged mediastinal, hilar, or axillary lymph nodes. Thyroid  gland, trachea, and esophagus demonstrate no significant findings. Lungs/Pleura: Moderate centrilobular emphysema. Bandlike scarring of the left upper lobe. Additional scarring of the bilateral lower lungs. No pleural effusion or pneumothorax. Musculoskeletal: No chest wall mass. Nonacute fractures of the lateral left fifth and sixth ribs. CT ABDOMEN PELVIS FINDINGS Hepatobiliary: No solid liver abnormality is seen. No gallstones, gallbladder wall thickening, or biliary dilatation. Pancreas: Unremarkable. No pancreatic ductal dilatation or surrounding inflammatory changes. Spleen: Normal in size without significant abnormality. Adrenals/Urinary Tract: Unchanged small benign left adrenal adenoma. Kidneys are normal, without renal calculi, solid lesion, or hydronephrosis. Bladder is unremarkable. Stomach/Bowel: Stomach is within normal limits. Appendix appears normal. No evidence of bowel wall thickening, distention, or inflammatory changes. Vascular/Lymphatic: Aortic atherosclerosis. No enlarged abdominal or pelvic lymph nodes. Reproductive: No mass or other abnormality. Other: No abdominal wall hernia or abnormality. No abdominopelvic ascites. Musculoskeletal: No acute or significant osseous findings. IMPRESSION: 1. No evidence of primary or metastatic disease within the chest, abdomen, or pelvis. 2. Unchanged bandlike scarring of the left upper lobe, in keeping with prior radiation therapy. No evidence of recurrent lung malignancy. 3. 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 4. Emphysema (ICD10-J43.9). 5. Enlargement of the main pulmonary artery up to 3.8 cm in caliber, which can be seen in pulmonary hypertension. 6. Aortic Atherosclerosis (ICD10-I70.0). Electronically Signed   By: Eddie Candle M.D.   On: 11/22/2019 16:29   MR Brain W and Wo Contrast  Result Date: 11/21/2019 CLINICAL DATA:  Fall,  hypotensive EXAM: MRI HEAD WITHOUT AND WITH CONTRAST TECHNIQUE: Multiplanar, multiecho pulse sequences of the brain and surrounding structures were obtained without and with intravenous contrast. CONTRAST:  8.46mL GADAVIST GADOBUTROL 1 MMOL/ML IV SOLN COMPARISON:  03/07/2019 FINDINGS: Significant motion artifact is present. Findings below are within this limitation. Brain: Extra-axial heterogeneously enhancing lesion is present at the vertex on the right making broad dural contact with the superior frontoparietal convexity and falx. This likely reflects progression of small extra-axial lesion seen on the 2020 study. Lesion currently measures approximately 4.5 x 3.6 x 2.4 cm. This abuts the superior sagittal sinus, which appears patent. Invasion is difficult to exclude on this motion degraded study. There is underlying parenchymal edema with mass effect on the right lateral ventricle. Sulcal T2 FLAIR hyperintensity is noted in the adjacent cortical sulci without apparent corresponding enhancement. There is a trace subdural collection along posterior right cerebral convexity measuring up to 2 mm in thickness. There is no acute infarction or intracranial hemorrhage. There is right temporal encephalomalacia reflecting chronic infarction with some susceptibility likely reflecting chronic blood products. Chronic infarct of the posterior right lentiform nucleus also with evidence of chronic blood products. There is ex vacuo dilatation of the right lateral ventricle. There is no intracranial mass, mass effect, or edema. There is no hydrocephalus or extra-axial fluid collection. Vascular: Major vessel flow voids at the skull base are preserved. Skull and upper cervical spine:  Chronic depression of the floor of the left orbit. Marrow signal is grossly normal. Sinuses/Orbits: Paranasal sinuses are aerated. Orbits are unremarkable. Other: Sella is unremarkable.  Mastoid air cells are clear. IMPRESSION: Significant motion  artifact. Extra-axial lesion at the vertex on the right with associated parenchymal edema and regional mass effect. Abuts superior sagittal sinus, which appears patent but invasion is difficult to exclude on this degraded study. A much smaller lesion was present in 2020 and this likely reflects a meningioma. There is nonspecific abnormal adjacent sulcal signal, which may reflect compression related alteration of hemodynamics. A concurrent leptomeningeal process is not excluded but unlikely unless this represents a metastasis. Trace posterior right cerebral convexity subdural collection. Additional chronic findings detailed above. Electronically Signed   By: Macy Mis M.D.   On: 11/21/2019 18:03   CT ABDOMEN PELVIS W CONTRAST  Result Date: 11/22/2019 CLINICAL DATA:  Brain neoplasm, staging EXAM: CT CHEST, ABDOMEN, AND PELVIS WITH CONTRAST TECHNIQUE: Multidetector CT imaging of the chest, abdomen and pelvis was performed following the standard protocol during bolus administration of intravenous contrast. CONTRAST:  166mL OMNIPAQUE IOHEXOL 300 MG/ML SOLN, additional oral enteric contrast COMPARISON:  CT chest abdomen pelvis, 03/01/2019 FINDINGS: CT CHEST FINDINGS Cardiovascular: Aortic atherosclerosis. 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 Unchanged enlargement of the descending thoracic aorta, measuring up to 3.4 x 3.4 cm near the diaphragm. Normal heart size. Enlargement of the main pulmonary artery up to 3.8 cm in caliber. No pericardial effusion. Mediastinum/Nodes: No enlarged mediastinal, hilar, or axillary lymph nodes. Thyroid gland, trachea, and esophagus demonstrate no significant findings. Lungs/Pleura: Moderate centrilobular emphysema. Bandlike scarring of the left upper lobe. Additional scarring of the bilateral lower lungs. No pleural effusion or pneumothorax. Musculoskeletal: No chest wall mass. Nonacute fractures of the lateral left fifth and  sixth ribs. CT ABDOMEN PELVIS FINDINGS Hepatobiliary: No solid liver abnormality is seen. No gallstones, gallbladder wall thickening, or biliary dilatation. Pancreas: Unremarkable. No pancreatic ductal dilatation or surrounding inflammatory changes. Spleen: Normal in size without significant abnormality. Adrenals/Urinary Tract: Unchanged small benign left adrenal adenoma. Kidneys are normal, without renal calculi, solid lesion, or hydronephrosis. Bladder is unremarkable. Stomach/Bowel: Stomach is within normal limits. Appendix appears normal. No evidence of bowel wall thickening, distention, or inflammatory changes. Vascular/Lymphatic: Aortic atherosclerosis. No enlarged abdominal or pelvic lymph nodes. Reproductive: No mass or other abnormality. Other: No abdominal wall hernia or abnormality. No abdominopelvic ascites. Musculoskeletal: No acute or significant osseous findings. IMPRESSION: 1. No evidence of primary or metastatic disease within the chest, abdomen, or pelvis. 2. Unchanged bandlike scarring of the left upper lobe, in keeping with prior radiation therapy. No evidence of recurrent lung malignancy. 3. 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 4. Emphysema (ICD10-J43.9). 5. Enlargement of the main pulmonary artery up to 3.8 cm in caliber, which can be seen in pulmonary hypertension. 6. Aortic Atherosclerosis (ICD10-I70.0). Electronically Signed   By: Eddie Candle M.D.   On: 11/22/2019 16:29   ECHOCARDIOGRAM COMPLETE  Result Date: 11/22/2019    ECHOCARDIOGRAM REPORT   Patient Name:   HALEEMAH BUCKALEW Date of Exam: 11/22/2019 Medical Rec #:  [id number]        Height:       69.0 in Accession #:    [id number]       Weight:       185.0  lb Date of Birth:  12-13-1945        BSA:          1.999 m Patient Age:    63 years         BP:           115/90 mmHg Patient Gender: F                HR:            78 bpm. Exam Location:  Inpatient Procedure: 2D Echo, Cardiac Doppler and Color Doppler Indications:    Syncope 780.2 / R55  History:        Patient has prior history of Echocardiogram examinations, most                 recent 03/08/2019. COPD and Stroke; Risk Factors:Hypertension,                 Dyslipidemia and Current Smoker. PE.  Sonographer:    Vickie Epley RDCS Referring Phys: [id number] [place]  1. Left ventricular ejection fraction, by estimation, is 60 to 65%. The left ventricle has normal function. The left ventricle has no regional wall motion abnormalities. Left ventricular diastolic parameters were normal.  2. Right ventricular systolic function is normal. The right ventricular size is normal. Tricuspid regurgitation signal is inadequate for assessing PA pressure.  3. The mitral valve is grossly normal. No evidence of mitral valve regurgitation. No evidence of mitral stenosis.  4. The aortic valve is tricuspid. Aortic valve regurgitation is not visualized. Mild to moderate aortic valve sclerosis/calcification is present, without any evidence of aortic stenosis.  5. The inferior vena cava is normal in size with greater than 50% respiratory variability, suggesting right atrial pressure of 3 mmHg. Comparison(s): No significant change from prior study. FINDINGS  Left Ventricle: Left ventricular ejection fraction, by estimation, is 60 to 65%. The left ventricle has normal function. The left ventricle has no regional wall motion abnormalities. The left ventricular internal cavity size was normal in size. There is  no left ventricular hypertrophy. Left ventricular diastolic parameters were normal. Right Ventricle: The right ventricular size is normal. No increase in right ventricular wall thickness. Right ventricular systolic function is normal. Tricuspid regurgitation signal is inadequate for assessing PA pressure. Left Atrium: Left atrial size was normal in size. Right Atrium: Right  atrial size was normal in size. Pericardium: Trivial pericardial effusion is present. Mitral Valve: The mitral valve is grossly normal. Mild mitral annular calcification. No evidence of mitral valve regurgitation. No evidence of mitral valve stenosis. Tricuspid Valve: The tricuspid valve is grossly normal. Tricuspid valve regurgitation is trivial. No evidence of tricuspid stenosis. Aortic Valve: The aortic valve is tricuspid. Aortic valve regurgitation is not visualized. Mild to moderate aortic valve sclerosis/calcification is present, without any evidence of aortic stenosis. There is moderate calcification of the aortic valve. Pulmonic Valve: The pulmonic valve was grossly normal. Pulmonic valve regurgitation is not visualized. No evidence of pulmonic stenosis. Aorta: The aortic root is normal in size and structure. Venous: The inferior vena cava is normal in size with greater than 50% respiratory variability, suggesting right atrial pressure of 3 mmHg. IAS/Shunts: The atrial septum is grossly normal.  LEFT VENTRICLE PLAX 2D LVIDd:         4.61 cm      Diastology LVIDs:         3.40 cm      LV e' lateral:   10.10 cm/s  LV PW:         0.82 cm      LV E/e' lateral: 8.4 LV IVS:        0.82 cm      LV e' medial:    9.14 cm/s LVOT diam:     2.20 cm      LV E/e' medial:  9.3 LV SV:         79 LV SV Index:   39 LVOT Area:     3.80 cm  LV Volumes (MOD) LV vol d, MOD A2C: 105.0 ml LV vol d, MOD A4C: 130.0 ml LV vol s, MOD A2C: 39.4 ml LV vol s, MOD A4C: 48.1 ml LV SV MOD A2C:     65.6 ml LV SV MOD A4C:     130.0 ml LV SV MOD BP:      79.2 ml RIGHT VENTRICLE RV S prime:     14.40 cm/s TAPSE (M-mode): 2.0 cm LEFT ATRIUM             Index       RIGHT ATRIUM           Index LA diam:        3.70 cm 1.85 cm/m  RA Area:     11.40 cm LA Vol (A2C):   44.6 ml 22.31 ml/m RA Volume:   23.40 ml  11.71 ml/m LA Vol (A4C):   26.7 ml 13.36 ml/m LA Biplane Vol: 35.9 ml 17.96 ml/m  AORTIC VALVE LVOT Vmax:   99.20 cm/s LVOT Vmean:   64.200 cm/s LVOT VTI:    0.207 m  AORTA Ao Root diam: 3.30 cm MITRAL VALVE MV Area (PHT): 3.77 cm    SHUNTS MV Decel Time: 201 msec    Systemic VTI:  0.21 m MV E velocity: 85.30 cm/s  Systemic Diam: 2.20 cm MV A velocity: 73.30 cm/s MV E/A ratio:  1.16 Eleonore Chiquito MD Electronically signed by Eleonore Chiquito MD Signature Date/Time: 11/22/2019/5:02:49 PM    Final        LOS: 2 days   [facility] Pager on [email or web address]  11/23/2019, 10:58 AM

## 2019-11-23 NOTE — Progress Notes (Addendum)
Neurosurgery Service Progress Note  Subjective: No acute events overnight   Objective: Vitals:   11/22/19 1945 11/22/19 2228 11/22/19 2325 11/23/19 0344  BP: 127/74  (!) 142/78 (!) 164/90  Pulse: 78 76 77 80  Resp: 18 16 17 20   Temp: 98 F (36.7 C)  98.1 F (36.7 C) 97.6 F (36.4 C)  TempSrc: Oral   Oral  SpO2: 100% 98% 98% 93%  Weight:      Height:       Temp (24hrs), Avg:98.1 F (36.7 C), Min:97.6 F (36.4 C), Max:98.7 F (37.1 C)  CBC Latest Ref Rng & Units 11/23/2019 11/22/2019 11/21/2019  WBC 4.0 - 10.5 K/uL 4.1 4.4 -  Hemoglobin 12.0 - 15.0 g/dL 16.0(H) 13.5 16.0(H)  Hematocrit 36.0 - 46.0 % 53.6(H) 46.8(H) 47.0(H)  Platelets 150 - 400 K/uL 177 173 -   BMP Latest Ref Rng & Units 11/23/2019 11/22/2019 11/22/2019  Glucose 70 - 99 mg/dL 96 107(H) 82  BUN 8 - 23 mg/dL 11 13 14   Creatinine 0.44 - 1.00 mg/dL 0.81 0.97 0.54  BUN/Creat Ratio 6 - 22 (calc) - - -  Sodium 135 - 145 mmol/L 140 137 146(H)  Potassium 3.5 - 5.1 mmol/L 4.5 4.7 2.7(LL)  Chloride 98 - 111 mmol/L 101 105 114(H)  CO2 22 - 32 mmol/L 27 26 22   Calcium 8.9 - 10.3 mg/dL 9.1 8.7(L) 6.9(L)    Intake/Output Summary (Last 24 hours) at 11/23/2019 0727 Last data filed at 11/22/2019 1945 Gross per 24 hour  Intake --  Output 1400 ml  Net -1400 ml    Current Facility-Administered Medications:  .  0.9 %  sodium chloride infusion, 75 mL/hr, Intravenous, Continuous, Myles Rosenthal A, MD .  acetaminophen (TYLENOL) tablet 650 mg, 650 mg, Oral, Q6H PRN, 650 mg at 11/22/19 0447 **OR** acetaminophen (TYLENOL) suppository 650 mg, 650 mg, Rectal, Q6H PRN, Myles Rosenthal A, MD .  albuterol (PROVENTIL) (2.5 MG/3ML) 0.083% nebulizer solution 2.5 mg, 2.5 mg, Nebulization, Q4H PRN, Clance Boll, MD .  apixaban Arne [place]) tablet 5 mg, 5 mg, Oral, BID, Myles Rosenthal A, MD, 5 mg at 11/22/19 2116 .  bictegravir-emtricitabine-tenofovir AF (BIKTARVY) 50-200-25 MG per tablet 1 tablet, 1 tablet, Oral, Daily, Clance Boll, MD, 1 tablet at 11/22/19 0919 .  diclofenac Sodium (VOLTAREN) 1 % topical gel 4 g, 4 g, Topical, BID PRN, Myles Rosenthal A, MD .  fluticasone furoate-vilanterol (BREO ELLIPTA) 200-25 MCG/INH 1 puff, 1 puff, Inhalation, Daily, Myles Rosenthal A, MD .  gabapentin (NEURONTIN) capsule 800 mg, 800 mg, Oral, TID, Myles Rosenthal A, MD, 800 mg at 11/22/19 2116 .  HYDROcodone-acetaminophen (NORCO/VICODIN) 5-325 MG per tablet 1 tablet, 1 tablet, Oral, Q6H PRN, Bonnielee Haff, MD, 1 tablet at 11/23/19 0436 .  loratadine (CLARITIN) tablet 10 mg, 10 mg, Oral, BID, Myles Rosenthal A, MD, 10 mg at 11/22/19 2116 .  LORazepam (ATIVAN) injection 1-2 mg, 1-2 mg, Intravenous, Q2H PRN, Myles Rosenthal A, MD .  montelukast (SINGULAIR) tablet 10 mg, 10 mg, Oral, QHS, Myles Rosenthal A, MD, 10 mg at 11/22/19 2116 .  ondansetron (ZOFRAN) tablet 4 mg, 4 mg, Oral, Q6H PRN **OR** ondansetron (ZOFRAN) injection 4 mg, 4 mg, Intravenous, Q6H PRN, Myles Rosenthal A, MD .  polyethylene glycol (MIRALAX / GLYCOLAX) packet 17 g, 17 g, Oral, Daily PRN, Myles Rosenthal A, MD .  QUEtiapine (SEROQUEL XR) 24 hr tablet 150 mg, 150 mg, Oral, QHS, Myles Rosenthal A, MD, 150 mg at 11/22/19 2117 .  rOPINIRole (REQUIP)  tablet 1 mg, 1 mg, Oral, QHS, Myles Rosenthal A, MD, 1 mg at 11/22/19 2116 .  sodium chloride flush (NS) 0.9 % injection 3 mL, 3 mL, Intravenous, Once, Cardama, Grayce Sessions, MD .  umeclidinium bromide (INCRUSE ELLIPTA) 62.5 MCG/INH 1 puff, 1 puff, Inhalation, Daily, Lucrezia Starch, MD, Stopped at 11/22/19 [phone]  venlafaxine XR (EFFEXOR-XR) 24 hr capsule 150 mg, 150 mg, Oral, Daily, Myles Rosenthal A, MD, 150 mg at 11/22/19 0919 .  vitamin A capsule 10,000 Units, 10,000 Units, Oral, Daily, Clance Boll, MD, 10,000 Units at 11/22/19 0920 .  vitamin C (ASCORBIC ACID) tablet 125 mg, 125 mg, Oral, Daily, Myles Rosenthal A, MD, 125 mg at 11/22/19 0919   Physical Exam: AOx3,  strength 4/5 on left with drift, 5/5 on R  Assessment & Plan: 74 y.o. 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 MRI brain w/ R convexity 3.5x2.4cm enhancing mass concerning for metastasis versus rapidly growing meningioma.   -CT CAP without evidence of primary -discussed w/ the patient, the lesion is concerning for metastatic disease versus a rapidly growing primary brain / meningeal tumor. Given the lack of diagnosis and size of the lesion, SRS is not appropriate, this will require operative resection. Given the holiday, will have to do this on Tuesday, 6/1 and hold anticoagulation prior to OR, try to optimize as well as possible in the meantime. Given her social situation, it sounds like she would unfortunately not be safe to return home and come back as an outpatient.  -will need volumetric CTH w/o contrast for intra-op navigation, will order the scan   [name]  11/23/19 7:27 AM

## 2019-11-23 NOTE — Progress Notes (Signed)
RT placed pt on CPAP dream station for the night on her home setting of 8 cmH2O w/4 Lpm bled into the system. Pt respiratory status is stable at this time. RT will continue to monitor.

## 2019-11-23 NOTE — Progress Notes (Addendum)
ANTICOAGULATION CONSULT NOTE - Initial Consult  Pharmacy Consult for Transitioning Apixaban to IV Heparin Indication: PE, Embolus in R Aortic Arch; R Renal and Splenic Infarcts  No Known Allergies  Patient Measurements: Height: 5\' 9"  (175.3 cm) Weight: 73.5 kg (162 lb 0.6 oz) IBW/kg (Calculated) : 66.2 Heparin Dosing Weight: 73.5 kg  Vital Signs: Temp: 99.3 F (37.4 C) (05/27 1152) Temp Source: Oral (05/27 1152) BP: 151/102 (05/27 1152) Pulse Rate: 80 (05/27 1152)  Labs: Recent Labs    11/21/19 2542 11/21/19 0919 11/21/19 2016 11/21/19 2016 11/22/19 0220 11/22/19 0220 11/22/19 0527 11/22/19 0809 11/22/19 1819 11/23/19 0331  HGB 14.3   < > 16.0*   < > 13.5  --   --   --   --  16.0*  HCT 48.3*   < > 47.0*  --  46.8*  --   --   --   --  53.6*  PLT 208  --   --   --  173  --   --   --   --  177  CREATININE 1.18*   < >  --   --  0.49   < >  --  0.54 0.97 0.81  CKTOTAL 243*  --   --   --   --   --   --   --   --   --   TROPONINIHS  --   --   --   --   --   --  7  --   --   --    < > = values in this interval not displayed.    Estimated Creatinine Clearance: 65.6 mL/min (by C-G formula based on SCr of 0.81 mg/dL).   Medical History: Past Medical History:  Diagnosis Date  . Asthma   . Atypical squamous cells of undetermined significance (ASCUS) on Papanicolaou smear of cervix   . Bed bug bite 07/12/2019  . COPD (chronic obstructive pulmonary disease) ([place])   . H/O drainage of abscess    left axilla, from left breast  . Hep B w/o coma   . HIV (human immunodeficiency virus infection) ([employer])   . Hyperlipidemia   . Lung cancer ([place]) 12/14/13   LUL Adenocarcinoma  . Neuromuscular disorder ([place])    fingers/feet neuropathy  . Non-small cell lung cancer ([place])   . S/P radiation therapy 01/26/14-02/02/14   sbrt  lt upper lung-54Gy/33fx   Assessment: 74 yr old female on apixaban 5 mg po BID for PE, embolus in R aortic arch, R renal infarct, splenic infarct in 02/2019 (on  apixaban therapy for life). Pharmacy is consulted to transition pt's anticoagulation from apixaban to IV heparin (starting on 5/28) in anticipation of neurosurgery on 6/1.   Pt has enlarging brain lesion with vasogenic edemia and mass effect; neurosurgery planning operative resection on 6/1.  Other medical hx includes: hx of lung cancer of LUL (S/P surgery/radiation, no recurrence), HIV, COPD/asthma,  nonhemorrhagic stroke 9/20, mild AKI on CKDIII, acute encephalopathy, acute respiratory failure on chronic hypoxic respiratory failure, HTN, OSA, chronic tobacco abuse.  H/H 16.0/53.6, 177; TBW CrCl ~73 ml/min  Given pt's recent apixaban therapy, will monitor anticoagulation using aPTT until aPTT and heparin levels correlate.  Goal of Therapy:  aPTT 66-102 sec Heparin level 0.3-0.7 units/ml Monitor platelets by anticoagulation protocol: Yes   Plan:  Stop apixaban therapy after tonight's 2200 dose Start IV heparin infusion (no bolus) at 1200 units/hr tomorrow at 1000 AM Check aPTT, heparin level ~7 hrs  after starting heparin infusion Monitor daily aPTT, heparin level, CBC Monitor for signs/symptoms of bleeding  Gillermina Hu, PharmD, BCPS, [facility] Clinical Pharmacist 11/23/2019,3:32 PM

## 2019-11-24 ENCOUNTER — Other Ambulatory Visit: Payer: Medicare Other

## 2019-11-24 LAB — BASIC METABOLIC PANEL
Anion gap: 11 (ref 5–15)
BUN: 19 mg/dL (ref 8–23)
CO2: 27 mmol/L (ref 22–32)
Calcium: 9.3 mg/dL (ref 8.9–10.3)
Chloride: 102 mmol/L (ref 98–111)
Creatinine, Ser: 0.85 mg/dL (ref 0.44–1.00)
GFR calc Af Amer: >60 mL/min (ref >60)
GFR calc non Af Amer: >60 mL/min (ref >60)
Glucose, Bld: 91 mg/dL (ref 70–99)
Potassium: 4.5 mmol/L (ref 3.5–5.1)
Sodium: 140 mmol/L (ref 135–145)

## 2019-11-24 LAB — CBC
HCT: 54 % — ABNORMAL HIGH (ref 36.0–46.0)
Hemoglobin: 16.4 g/dL — ABNORMAL HIGH (ref 12.0–15.0)
MCH: 28.5 pg (ref 26.0–34.0)
MCHC: 30.4 g/dL (ref 30.0–36.0)
MCV: 93.8 fL (ref 80.0–100.0)
Platelets: UNDETERMINED 10*3/uL (ref 150–400)
RBC: 5.76 MIL/uL — ABNORMAL HIGH (ref 3.87–5.11)
RDW: 15.9 % — ABNORMAL HIGH (ref 11.5–15.5)
WBC: 4.2 10*3/uL (ref 4.0–10.5)
nRBC: 0 % (ref 0.0–0.2)

## 2019-11-24 LAB — HEPARIN LEVEL (UNFRACTIONATED): Heparin Unfractionated: 0.98 IU/mL — ABNORMAL HIGH (ref 0.30–0.70)

## 2019-11-24 LAB — APTT: aPTT: 80 seconds — ABNORMAL HIGH (ref 24–36)

## 2019-11-24 MED ORDER — POLYETHYLENE GLYCOL 3350 17 G PO PACK
17.0000 g | PACK | Freq: Two times a day (BID) | ORAL | Status: DC
  Administered 2019-11-24 – 2019-12-08 (×26): 17 g via ORAL
  Filled 2019-11-24 (×28): qty 1

## 2019-11-24 MED ORDER — SENNA 8.6 MG PO TABS
2.0000 | ORAL_TABLET | Freq: Every day | ORAL | Status: DC
  Administered 2019-11-24 – 2019-12-07 (×14): 17.2 mg via ORAL
  Filled 2019-11-24 (×14): qty 2

## 2019-11-24 NOTE — Progress Notes (Signed)
Neurosurgery Service Progress Note  Subjective: No acute events overnight   Objective: Vitals:   11/23/19 2348 11/24/19 0349 11/24/19 0500 11/24/19 0758  BP:  111/68  (!) 87/51  Pulse: 77 73  71  Resp: 18 17  16   Temp:  98.3 F (36.8 C)  97.7 F (36.5 C)  TempSrc:  Oral  Oral  SpO2: 93% 98%  97%  Weight:   87 kg   Height:       Temp (24hrs), Avg:98.4 F (36.9 C), Min:97.7 F (36.5 C), Max:99.3 F (37.4 C)  CBC Latest Ref Rng & Units 11/24/2019 11/23/2019 11/22/2019  WBC 4.0 - 10.5 K/uL 4.2 4.1 4.4  Hemoglobin 12.0 - 15.0 g/dL 16.4(H) 16.0(H) 13.5  Hematocrit 36.0 - 46.0 % 54.0(H) 53.6(H) 46.8(H)  Platelets 150 - 400 K/uL PLATELET CLUMPS NOTED ON SMEAR, UNABLE TO ESTIMATE 177 173   BMP Latest Ref Rng & Units 11/24/2019 11/23/2019 11/22/2019  Glucose 70 - 99 mg/dL 91 96 107(H)  BUN 8 - 23 mg/dL 19 11 13   Creatinine 0.44 - 1.00 mg/dL 0.85 0.81 0.97  BUN/Creat Ratio 6 - 22 (calc) - - -  Sodium 135 - 145 mmol/L 140 140 137  Potassium 3.5 - 5.1 mmol/L 4.5 4.5 4.7  Chloride 98 - 111 mmol/L 102 101 105  CO2 22 - 32 mmol/L 27 27 26   Calcium 8.9 - 10.3 mg/dL 9.3 9.1 8.7(L)    Intake/Output Summary (Last 24 hours) at 11/24/2019 0908 Last data filed at 11/24/2019 0000 Gross per 24 hour  Intake 236 ml  Output 1675 ml  Net -1439 ml    Current Facility-Administered Medications:  .  acetaminophen (TYLENOL) tablet 650 mg, 650 mg, Oral, Q6H PRN, 650 mg at 11/22/19 0447 **OR** acetaminophen (TYLENOL) suppository 650 mg, 650 mg, Rectal, Q6H PRN, Myles Rosenthal A, MD .  albuterol (PROVENTIL) (2.5 MG/3ML) 0.083% nebulizer solution 2.5 mg, 2.5 mg, Nebulization, Q4H PRN, Clance Boll, MD .  bictegravir-emtricitabine-tenofovir AF (BIKTARVY) 50-200-25 MG per tablet 1 tablet, 1 tablet, Oral, Daily, Clance Boll, MD, 1 tablet at 11/24/19 0905 .  diclofenac Sodium (VOLTAREN) 1 % topical gel 4 g, 4 g, Topical, BID PRN, Myles Rosenthal A, MD .  fluticasone furoate-vilanterol (BREO  ELLIPTA) 200-25 MCG/INH 1 puff, 1 puff, Inhalation, Daily, Myles Rosenthal A, MD .  gabapentin (NEURONTIN) capsule 800 mg, 800 mg, Oral, TID, Myles Rosenthal A, MD, 800 mg at 11/24/19 9233 .  heparin ADULT infusion 100 units/mL (25000 units/219mL sodium chloride 0.45%), 1,200 Units/hr, Intravenous, Continuous, Bonnielee Haff, MD .  HYDROcodone-acetaminophen (NORCO/VICODIN) 5-325 MG per tablet 1 tablet, 1 tablet, Oral, Q6H PRN, Bonnielee Haff, MD, 1 tablet at 11/23/19 2003 .  loratadine (CLARITIN) tablet 10 mg, 10 mg, Oral, BID, Myles Rosenthal A, MD, 10 mg at 11/24/19 0905 .  LORazepam (ATIVAN) injection 1-2 mg, 1-2 mg, Intravenous, Q2H PRN, Myles Rosenthal A, MD .  montelukast (SINGULAIR) tablet 10 mg, 10 mg, Oral, QHS, Myles Rosenthal A, MD, 10 mg at 11/23/19 2226 .  ondansetron (ZOFRAN) tablet 4 mg, 4 mg, Oral, Q6H PRN **OR** ondansetron (ZOFRAN) injection 4 mg, 4 mg, Intravenous, Q6H PRN, Myles Rosenthal A, MD .  polyethylene glycol (MIRALAX / GLYCOLAX) packet 17 g, 17 g, Oral, BID, Bonnielee Haff, MD, 17 g at 11/24/19 0905 .  QUEtiapine (SEROQUEL XR) 24 hr tablet 150 mg, 150 mg, Oral, QHS, Myles Rosenthal A, MD, 150 mg at 11/23/19 2221 .  rOPINIRole (REQUIP) tablet 1 mg, 1 mg, Oral, QHS, Myles Rosenthal  A, MD, 1 mg at 11/23/19 2221 .  senna (SENOKOT) tablet 17.2 mg, 2 tablet, Oral, QHS, Bonnielee Haff, MD .  sodium chloride flush (NS) 0.9 % injection 3 mL, 3 mL, Intravenous, Once, Cardama, Grayce Sessions, MD .  umeclidinium bromide (INCRUSE ELLIPTA) 62.5 MCG/INH 1 puff, 1 puff, Inhalation, Daily, Lucrezia Starch, MD, Stopped at 11/22/19 [phone] .  venlafaxine XR (EFFEXOR-XR) 24 hr capsule 150 mg, 150 mg, Oral, Daily, Myles Rosenthal A, MD, 150 mg at 11/24/19 0905 .  vitamin C (ASCORBIC ACID) tablet 125 mg, 125 mg, Oral, Daily, Myles Rosenthal A, MD, 125 mg at 11/24/19 0601   Physical Exam: Pt working with care team during rounds this morning  Assessment & Plan: 74  y.o. 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 MRI brain w/ R convexity 3.5x2.4cm enhancing mass concerning for metastasis versus rapidly growing meningioma. CT CAP without evidence of primary. 5/27 [place] done  -OR 6/1 for resection, hold heparin pre-op and keep NPO after midnight  [name]  11/24/19 9:08 AM

## 2019-11-24 NOTE — Progress Notes (Signed)
[facility] PROGRESS NOTE   [name] EQA:[id number] DOB: 06-19-1946 DOA: 11/21/2019  PCP: Nolene Ebbs, MD  Brief History/Interval Summary: [name] is a 74 y.o. 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 Per ems patient was found face down, but noted to able to give some history of her fall and noted complaints of pain on her left hip and back and denied any LOC at that time. Per EMS patient was found to be unkempt and her room was note to be squalid condition. At the scene her blood pressure as 80/50, sat was 87% she was off her home O2.She was treated with 500 ns en route.  ED Course:  In the ED patient was noted to be lethargic and was unable to give history initial and worsen ensured related to change in mental status.    Evaluation was positive for a brain lesion.  Patient was hospitalized for further management.   Consultants: Neurosurgery. Phone conversation with neurology  Procedures: EEG  Antibiotics: Anti-infectives (From admission, onward)   Start     Dose/Rate Route Frequency Ordered Stop   11/21/19 2000  bictegravir-emtricitabine-tenofovir AF (BIKTARVY) 50-200-25 MG per tablet 1 tablet     1 tablet Oral Daily 11/21/19 1953        Subjective/Interval History: Patient complains of pain in her shoulders and back as a result of the fall that she had a few days ago.  Denies any new complaints.  Able to urinate without difficulties.  Denies any  headaches.  ROS: Denies any shortness of breath    Assessment/Plan:  Enlarging Brain lesion with vasogenic edema and Mass effect  There was concern for seizure activity.  Based on H&P this issue was discussed with a neurologist who did not recommend steroids or antiepileptic drugs at this time.  EEG did not show any epileptiform activity. Neurosurgery is following.  They recommended staging work-up due to patient's previous history of lung cancer.  Patient previously seen by Dr. [name].  CT scan of the chest abdomen pelvis does not show any evidence for either primary tumor or metastatic process.   Neurosurgery is planning an operative resection.  This will apparently be done on Tuesday.  Patient however will need to stay in the hospital for the duration as she is not thought to be safe to return home and come back as an outpatient.  Her anticoagulation has been discontinued.  She will be started on IV heparin bridge per pharmacy.  Discussed with Dr. Venetia Constable.  Plan to stop the heparin infusion at 7 AM on June 1.   PT and OT evaluation.  Acute Encephalopathy, other Seems to be back to her baseline.  Could be secondary to fall, dehydration, brain lesion.  Acute hypoxic/hypercarbic respiratory failure on chronic hypoxic respiratory failure  Possibly related to noncompliance with home oxygen.  Seems to have resolved now.  She is on home oxygen at 3 to 4 L/min.  Hx of Lung CA  This involved her left upper lobe.  Appears that she underwent surgery and  radiation treatment.  No evidence for recurrence on CT scan.  Mild AKI on CKDIII Resolved with IV hydration.  Hypokalemia Aggressively repleted.  Corrected.  Magnesium was also repleted.  Normal this morning.    HIV  Continue antiretrovirals.  Fall ,Mechanical No injuries noted on work-up.-no fx on noted on work up.  History of COPD /Asthma Seems to be stable.  Continue home medication regimen.  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  This will need to be discontinued for her neurosurgery as discussed above. Discussed with Dr. Leonie Man with stroke service.  He recommends stopping apixaban and bridging with IV heparin about 3 to 4 days prior to surgery.  Anticoagulation to be reinitiated after surgery based on neurosurgery input.  Hx of CVA, nonhemorrhagic 9/20 Sequela of left sided weakness.  Patient on apixaban.  See above.   History of essential hypertension Patient's blood pressure noted to be high at times.  She is not on any antihypertensives at home.  Continue to monitor.  Will initiate medications if she is persistently hypertensive.   OSA  CPAP qhs   Chronic Tobacco abuse  Patient continue to smoke daily. Nicotine patch /encourage cessation    DVT Prophylaxis: Apixaban Code Status: Full code Family Communication: Discussed with the patient.  No family at bedside. Disposition Plan:  Status is: Inpatient  Remains inpatient appropriate because:Ongoing diagnostic testing needed not appropriate for outpatient work up   Dispo: The patient is from: Home              Anticipated d/c is to: To be determined              Anticipated d/c date is: Next week after surgery              Patient currently is not medically stable to d/c.    Medications:  Scheduled: . bictegravir-emtricitabine-tenofovir AF  1 tablet Oral Daily  . fluticasone furoate-vilanterol  1 puff Inhalation Daily  . gabapentin  800 mg Oral TID  . loratadine  10 mg Oral BID  . montelukast  10 mg Oral QHS  . polyethylene glycol  17 g Oral BID  . QUEtiapine Fumarate  150 mg Oral QHS  . rOPINIRole  1 mg Oral QHS  . senna  2 tablet Oral QHS  . sodium chloride flush  3 mL Intravenous Once  . umeclidinium bromide  1 puff Inhalation Daily  . venlafaxine XR  150 mg Oral Daily  . vitamin C  125 mg Oral Daily   Continuous: . heparin      BJS:[id number] **OR** acetaminophen, albuterol, diclofenac Sodium, HYDROcodone-acetaminophen, LORazepam, ondansetron **OR** ondansetron (ZOFRAN) IV   Objective:  Vital Signs  Vitals:   11/23/19 2348 11/24/19 0349 11/24/19 0500 11/24/19 0758  BP:  111/68  (!) 87/51  Pulse: 77 73  71  Resp: 18 17  16   Temp:  98.3 F (36.8 C)  97.7 F (36.5 C)  TempSrc:  Oral  Oral  SpO2: 93% 98%  97%  Weight:   87 kg   Height:        Intake/Output Summary (Last 24 hours) at 11/24/2019 0952 Last data filed at 11/24/2019 0000 Gross per 24 hour  Intake 236 ml  Output 1675 ml  Net -1439 ml   Filed Weights   11/22/19 1900 11/24/19 0500  Weight: 73.5 kg 87 kg    General appearance: Awake alert.  In no distress Resp: Clear to auscultation bilaterally.  Normal effort Cardio: S1-S2 is normal regular.  No S3-S4.  No rubs murmurs or bruit GI: Abdomen is soft.  Nontender nondistended.  Bowel sounds are present normal.  No masses organomegaly Extremities: No edema.  Full range of motion of lower extremities. Neurologic: x3.  No focal neurological deficits.      Lab Results:  Data Reviewed: I have personally reviewed following labs and imaging studies  CBC: Recent Labs  Lab 11/21/19 0628 11/21/19 0628 11/21/19 0919 11/21/19 2016 11/22/19 0220 11/23/19 0331 11/24/19 0444  WBC 6.2  --   --   --  4.4 4.1 4.2  HGB 14.3   < > 13.6 16.0* 13.5 16.0* 16.4*  HCT 48.3*   < > 40.0 47.0* 46.8* 53.6* 54.0*  MCV 94.3  --   --   --  96.7 93.9 93.8  PLT 208  --   --   --  173 177 PLATELET CLUMPS NOTED ON SMEAR, UNABLE TO ESTIMATE   < > = values in this interval not displayed.    Basic Metabolic Panel: Recent Labs  Lab 11/22/19 0220 11/22/19 0809 11/22/19 0856 11/22/19 1819 11/23/19 0331 11/24/19 0444  NA 146* 146*  --  137 140 140  K 2.3* 2.7*  --  4.7 4.5 4.5  CL 120* 114*  --  105 101 102  CO2 18* 22  --  26 27 27   GLUCOSE 76 82  --  107* 96 91  BUN 12 14  --  13 11 19    CREATININE 0.49 0.54  --  0.97 0.81 0.85  CALCIUM 5.9* 6.9*  --  8.7* 9.1 9.3  MG  --   --  1.3*  --  1.9  --     GFR: Estimated Creatinine Clearance: 70.4 mL/min (by C-G formula based on SCr of 0.85 mg/dL).  Liver Function Tests: Recent Labs  Lab 11/21/19 0628 11/22/19 0220 11/22/19 0809  AST 31 18 20   ALT 18 13 13   ALKPHOS 50 34* 42  BILITOT 0.5 0.4 0.1*  PROT 6.7 4.7* 5.0*  ALBUMIN 3.0* 2.1* 2.1*     Cardiac Enzymes: Recent Labs  Lab 11/21/19 0628  CKTOTAL 243*     Thyroid Function Tests: Recent Labs    11/22/19 0220  TSH 0.487      Recent Results (from the past 240 hour(s))  Blood culture (routine x 2)     Status: None (Preliminary result)   Collection Time: 11/21/19 10:30 AM   Specimen: BLOOD LEFT ARM  Result Value Ref Range Status   Specimen Description BLOOD LEFT ARM  Final   Special Requests   Final    BOTTLES DRAWN AEROBIC AND ANAEROBIC Blood Culture results may not be optimal due to an inadequate volume of blood received in culture bottles   Culture   Final    NO GROWTH 3 DAYS Performed at [facility], [place] [address], [place], [place] [address]    Report Status PENDING  Incomplete  SARS Coronavirus 2 by RT PCR (hospital order, performed in [facility] hospital lab) Nasopharyngeal Nasopharyngeal Swab     Status: None   Collection Time: 11/21/19 12:32 PM   Specimen: Nasopharyngeal Swab  Result Value Ref Range Status   SARS Coronavirus 2 NEGATIVE NEGATIVE Final    Comment: (NOTE) SARS-CoV-2 target nucleic acids are NOT DETECTED. The SARS-CoV-2 RNA is generally detectable in upper and lower respiratory specimens during the acute phase of infection.  The lowest concentration of SARS-CoV-2 viral copies this assay can detect is 250 copies / mL. A negative result does not preclude SARS-CoV-2 infection and should not be used as the sole basis for treatment or other patient management decisions.  A negative result may occur with improper  specimen collection / handling, submission of specimen other than nasopharyngeal swab, presence of viral mutation(s) within the areas targeted by this assay, and inadequate number of viral copies (<250 copies / mL). A negative result must be combined with clinical observations, patient history, and epidemiological information. Fact Sheet for Patients:   StrictlyIdeas.no Fact Sheet for Healthcare Providers: BankingDealers.co.za This test is not yet approved or cleared  by the [place] FDA and has been authorized for detection and/or diagnosis of SARS-CoV-2 by FDA under an Emergency Use Authorization (EUA).  This EUA will remain in effect (meaning this test can be used) for the duration of the COVID-19 declaration under Section 564(b)(1) of the Act, 21 U.S.C. section 360bbb-3(b)(1), unless the authorization is terminated or revoked sooner. Performed at [facility], [place] [address] [place], [place] [address]   Blood culture (routine x 2)     Status: None (Preliminary result)   Collection Time: 11/21/19  5:49 PM   Specimen: BLOOD  Result Value Ref Range Status   Specimen Description BLOOD SITE NOT SPECIFIED  Final   Special Requests   Final    BOTTLES DRAWN AEROBIC AND ANAEROBIC Blood Culture results may not be optimal due to an inadequate volume of blood received in culture bottles   Culture   Final    NO GROWTH 3 DAYS Performed at [facility], [place] [address], [place], [place] [address]    Report Status PENDING  Incomplete      Radiology Studies: EEG  Result Date: 11/22/2019 Lora Havens, MD     11/22/2019 11:53 AM Patient Name: TENZIN PAVON MRN: [id number] Epilepsy Attending: Lora Havens Referring Physician/Provider: Dr. Clarise Cruz- Roda Shutters Date: 11/22/2019 Duration: 23.46 minutes Patient history: 74 year old female who presented after being found down.  EEG evaluate for seizures. Level of alertness: Awake,  drowsy, sleep, comatose, lethargic AEDs during EEG study: Gabapentin Technical aspects: This EEG study was done with scalp electrodes positioned according to the 10-20 International system of electrode placement. Electrical activity was acquired at a sampling rate of 500Hz  and reviewed with a high frequency filter of 70Hz  and a low frequency filter of 1Hz . EEG data were recorded continuously and digitally stored. Description: The posterior dominant rhythm consists of 8-9 Hz activity of moderate voltage (25-35 uV) seen predominantly in posterior head regions, symmetric and reactive to eye opening and eye closing. Hyperventilation and photic stimulation were not performed.   IMPRESSION: This study is within normal limits. No seizures or epileptiform discharges were seen throughout the recording. Lora Havens   CT HEAD WO CONTRAST  Result Date: 11/23/2019 CLINICAL DATA:  Benign brain neoplasm. Preop evaluation. [facility] protocol. EXAM: CT HEAD WITHOUT CONTRAST TECHNIQUE: Contiguous axial images were obtained from the base of the skull through the vertex without intravenous contrast. COMPARISON:  CT head 11/21/2019.  MRI head with contrast 11/21/2019 FINDINGS: Brain: Right convexity extra-axial mass lesion is unchanged. This is slightly hyperdense to cortex and abuts the falx and superior sagittal sinus. The mass lesion shows irregular enhancement on MRI. There is a moderate amount of vasogenic edema in the right frontal parietal lobe. Findings are most compatible with meningioma. Chronic infarct right posterolateral temporal lobe  is unchanged. Negative for acute infarct or hemorrhage. Vascular: Negative for hyperdense vessel. Skull: No skeletal lesion.  No skeletal hyperostosis. Sinuses/Orbits: Paranasal sinuses clear. Chronic fracture left orbital floor is unchanged. No orbital mass lesion. Other: None IMPRESSION: Right convexity extra-axial mass compatible with meningioma. This abuts the falx and superior  sagittal sinus. Intravenous contrast not administered on the CT. There is moderate amount of vasogenic edema in the right frontal and parietal lobes. No change from recent studies. Chronic infarct right posterolateral temporal lobe. Electronically Signed   By: Franchot Gallo M.D.   On: 11/23/2019 10:55   CT CHEST W CONTRAST  Result Date: 11/22/2019 CLINICAL DATA:  Brain neoplasm, staging EXAM: CT CHEST, ABDOMEN, AND PELVIS WITH CONTRAST TECHNIQUE: Multidetector CT imaging of the chest, abdomen and pelvis was performed following the standard protocol during bolus administration of intravenous contrast. CONTRAST:  159mL OMNIPAQUE IOHEXOL 300 MG/ML SOLN, additional oral enteric contrast COMPARISON:  CT chest abdomen pelvis, 03/01/2019 FINDINGS: CT CHEST FINDINGS Cardiovascular: Aortic atherosclerosis. 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 Unchanged enlargement of the descending thoracic aorta, measuring up to 3.4 x 3.4 cm near the diaphragm. Normal heart size. Enlargement of the main pulmonary artery up to 3.8 cm in caliber. No pericardial effusion. Mediastinum/Nodes: No enlarged mediastinal, hilar, or axillary lymph nodes. Thyroid gland, trachea, and esophagus demonstrate no significant findings. Lungs/Pleura: Moderate centrilobular emphysema. Bandlike scarring of the left upper lobe. Additional scarring of the bilateral lower lungs. No pleural effusion or pneumothorax. Musculoskeletal: No chest wall mass. Nonacute fractures of the lateral left fifth and sixth ribs. CT ABDOMEN PELVIS FINDINGS Hepatobiliary: No solid liver abnormality is seen. No gallstones, gallbladder wall thickening, or biliary dilatation. Pancreas: Unremarkable. No pancreatic ductal dilatation or surrounding inflammatory changes. Spleen: Normal in size without significant abnormality. Adrenals/Urinary Tract: Unchanged small benign left adrenal adenoma. Kidneys are normal, without renal  calculi, solid lesion, or hydronephrosis. Bladder is unremarkable. Stomach/Bowel: Stomach is within normal limits. Appendix appears normal. No evidence of bowel wall thickening, distention, or inflammatory changes. Vascular/Lymphatic: Aortic atherosclerosis. No enlarged abdominal or pelvic lymph nodes. Reproductive: No mass or other abnormality. Other: No abdominal wall hernia or abnormality. No abdominopelvic ascites. Musculoskeletal: No acute or significant osseous findings. IMPRESSION: 1. No evidence of primary or metastatic disease within the chest, abdomen, or pelvis. 2. Unchanged bandlike scarring of the left upper lobe, in keeping with prior radiation therapy. No evidence of recurrent lung malignancy. 3. 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 4. Emphysema (ICD10-J43.9). 5. Enlargement of the main pulmonary artery up to 3.8 cm in caliber, which can be seen in pulmonary hypertension. 6. Aortic Atherosclerosis (ICD10-I70.0). Electronically Signed   By: Eddie Candle M.D.   On: 11/22/2019 16:29   CT ABDOMEN PELVIS W CONTRAST  Result Date: 11/22/2019 CLINICAL DATA:  Brain neoplasm, staging EXAM: CT CHEST, ABDOMEN, AND PELVIS WITH CONTRAST TECHNIQUE: Multidetector CT imaging of the chest, abdomen and pelvis was performed following the standard protocol during bolus administration of intravenous contrast. CONTRAST:  158mL OMNIPAQUE IOHEXOL 300 MG/ML SOLN, additional oral enteric contrast COMPARISON:  CT chest abdomen pelvis, 03/01/2019 FINDINGS: CT CHEST FINDINGS Cardiovascular: Aortic atherosclerosis. 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 Unchanged enlargement of the descending thoracic aorta, measuring up to 3.4 x 3.4 cm near the diaphragm. Normal  heart size. Enlargement of the main pulmonary artery up to 3.8 cm in caliber. No pericardial  effusion. Mediastinum/Nodes: No enlarged mediastinal, hilar, or axillary lymph nodes. Thyroid gland, trachea, and esophagus demonstrate no significant findings. Lungs/Pleura: Moderate centrilobular emphysema. Bandlike scarring of the left upper lobe. Additional scarring of the bilateral lower lungs. No pleural effusion or pneumothorax. Musculoskeletal: No chest wall mass. Nonacute fractures of the lateral left fifth and sixth ribs. CT ABDOMEN PELVIS FINDINGS Hepatobiliary: No solid liver abnormality is seen. No gallstones, gallbladder wall thickening, or biliary dilatation. Pancreas: Unremarkable. No pancreatic ductal dilatation or surrounding inflammatory changes. Spleen: Normal in size without significant abnormality. Adrenals/Urinary Tract: Unchanged small benign left adrenal adenoma. Kidneys are normal, without renal calculi, solid lesion, or hydronephrosis. Bladder is unremarkable. Stomach/Bowel: Stomach is within normal limits. Appendix appears normal. No evidence of bowel wall thickening, distention, or inflammatory changes. Vascular/Lymphatic: Aortic atherosclerosis. No enlarged abdominal or pelvic lymph nodes. Reproductive: No mass or other abnormality. Other: No abdominal wall hernia or abnormality. No abdominopelvic ascites. Musculoskeletal: No acute or significant osseous findings. IMPRESSION: 1. No evidence of primary or metastatic disease within the chest, abdomen, or pelvis. 2. Unchanged bandlike scarring of the left upper lobe, in keeping with prior radiation therapy. No evidence of recurrent lung malignancy. 3. 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 4. Emphysema (ICD10-J43.9). 5. Enlargement of the main pulmonary artery up to 3.8 cm in caliber, which can be seen in pulmonary hypertension. 6. Aortic Atherosclerosis (ICD10-I70.0). Electronically Signed   By: Eddie Candle M.D.   On:  11/22/2019 16:29   ECHOCARDIOGRAM COMPLETE  Result Date: 11/22/2019    ECHOCARDIOGRAM REPORT   Patient Name:   KEIR FOLAND Date of Exam: 11/22/2019 Medical Rec #:  [id number]        Height:       69.0 in Accession #:    [id number]       Weight:       185.0 lb Date of Birth:  09/28/1945        BSA:          1.999 m Patient Age:    24 years         BP:           115/90 mmHg Patient Gender: F                HR:           78 bpm. Exam Location:  Inpatient Procedure: 2D Echo, Cardiac Doppler and Color Doppler Indications:    Syncope 780.2 / R55  History:        Patient has prior history of Echocardiogram examinations, most                 recent 03/08/2019. COPD and Stroke; Risk Factors:Hypertension,                 Dyslipidemia and Current Smoker. PE.  Sonographer:    Vickie Epley RDCS Referring Phys: [id number] [place]  1. Left ventricular ejection fraction, by estimation, is 60 to 65%. The left ventricle has normal function. The left ventricle has no regional wall motion abnormalities. Left ventricular diastolic parameters were normal.  2. Right ventricular systolic function is normal. The right ventricular size is normal. Tricuspid regurgitation signal is inadequate for assessing PA pressure.  3. The mitral valve is grossly normal.  No evidence of mitral valve regurgitation. No evidence of mitral stenosis.  4. The aortic valve is tricuspid. Aortic valve regurgitation is not visualized. Mild to moderate aortic valve sclerosis/calcification is present, without any evidence of aortic stenosis.  5. The inferior vena cava is normal in size with greater than 50% respiratory variability, suggesting right atrial pressure of 3 mmHg. Comparison(s): No significant change from prior study. FINDINGS  Left Ventricle: Left ventricular ejection fraction, by estimation, is 60 to 65%. The left ventricle has normal function. The left ventricle has no regional wall motion abnormalities. The left ventricular  internal cavity size was normal in size. There is  no left ventricular hypertrophy. Left ventricular diastolic parameters were normal. Right Ventricle: The right ventricular size is normal. No increase in right ventricular wall thickness. Right ventricular systolic function is normal. Tricuspid regurgitation signal is inadequate for assessing PA pressure. Left Atrium: Left atrial size was normal in size. Right Atrium: Right atrial size was normal in size. Pericardium: Trivial pericardial effusion is present. Mitral Valve: The mitral valve is grossly normal. Mild mitral annular calcification. No evidence of mitral valve regurgitation. No evidence of mitral valve stenosis. Tricuspid Valve: The tricuspid valve is grossly normal. Tricuspid valve regurgitation is trivial. No evidence of tricuspid stenosis. Aortic Valve: The aortic valve is tricuspid. Aortic valve regurgitation is not visualized. Mild to moderate aortic valve sclerosis/calcification is present, without any evidence of aortic stenosis. There is moderate calcification of the aortic valve. Pulmonic Valve: The pulmonic valve was grossly normal. Pulmonic valve regurgitation is not visualized. No evidence of pulmonic stenosis. Aorta: The aortic root is normal in size and structure. Venous: The inferior vena cava is normal in size with greater than 50% respiratory variability, suggesting right atrial pressure of 3 mmHg. IAS/Shunts: The atrial septum is grossly normal.  LEFT VENTRICLE PLAX 2D LVIDd:         4.61 cm      Diastology LVIDs:         3.40 cm      LV e' lateral:   10.10 cm/s LV PW:         0.82 cm      LV E/e' lateral: 8.4 LV IVS:        0.82 cm      LV e' medial:    9.14 cm/s LVOT diam:     2.20 cm      LV E/e' medial:  9.3 LV SV:         79 LV SV Index:   39 LVOT Area:     3.80 cm  LV Volumes (MOD) LV vol d, MOD A2C: 105.0 ml LV vol d, MOD A4C: 130.0 ml LV vol s, MOD A2C: 39.4 ml LV vol s, MOD A4C: 48.1 ml LV SV MOD A2C:     65.6 ml LV SV MOD A4C:      130.0 ml LV SV MOD BP:      79.2 ml RIGHT VENTRICLE RV S prime:     14.40 cm/s TAPSE (M-mode): 2.0 cm LEFT ATRIUM             Index       RIGHT ATRIUM           Index LA diam:        3.70 cm 1.85 cm/m  RA Area:     11.40 cm LA Vol (A2C):   44.6 ml 22.31 ml/m RA Volume:   23.40 ml  11.71 ml/m LA Vol (A4C):  26.7 ml 13.36 ml/m LA Biplane Vol: 35.9 ml 17.96 ml/m  AORTIC VALVE LVOT Vmax:   99.20 cm/s LVOT Vmean:  64.200 cm/s LVOT VTI:    0.207 m  AORTA Ao Root diam: 3.30 cm MITRAL VALVE MV Area (PHT): 3.77 cm    SHUNTS MV Decel Time: 201 msec    Systemic VTI:  0.21 m MV E velocity: 85.30 cm/s  Systemic Diam: 2.20 cm MV A velocity: 73.30 cm/s MV E/A ratio:  1.16 Eleonore Chiquito MD Electronically signed by Eleonore Chiquito MD Signature Date/Time: 11/22/2019/5:02:49 PM    Final        LOS: 3 days   [facility] Pager on [email or web address]  11/24/2019, 9:52 AM

## 2019-11-24 NOTE — Progress Notes (Signed)
Pt IV infiltrated and attempted insertion of IV with 2 failed attempts. IV team consulted. Will restart Heparin drip at that time of new IV insertion.

## 2019-11-24 NOTE — Progress Notes (Signed)
ANTICOAGULATION CONSULT NOTE - Follow Up Consult  Pharmacy Consult for Transitioning Apixaban to IV Heparin Indication: PE, Embolus in R Aortic Arch; R Renal and Splenic Infarcts  No Known Allergies  Patient Measurements: Height: 5\' 9"  (175.3 cm) Weight: 87 kg (191 lb 12.8 oz) IBW/kg (Calculated) : 66.2 Heparin Dosing Weight: 73.5 kg  Vital Signs: Temp: 97.7 F (36.5 C) (05/28 1935) Temp Source: Oral (05/28 1935) BP: 136/73 (05/28 1935) Pulse Rate: 79 (05/28 1935)  Labs: Recent Labs    11/22/19 0220 11/22/19 0220 11/22/19 0527 11/22/19 0809 11/22/19 1819 11/23/19 0331 11/24/19 0444 11/24/19 2004  HGB 13.5   < >  --   --   --  16.0* 16.4*  --   HCT 46.8*  --   --   --   --  53.6* 54.0*  --   PLT 173  --   --   --   --  177 PLATELET CLUMPS NOTED ON SMEAR, UNABLE TO ESTIMATE  --   APTT  --   --   --   --   --   --   --  80*  HEPARINUNFRC  --   --   --   --   --   --   --  0.98*  CREATININE 0.49  --   --    < > 0.97 0.81 0.85  --   TROPONINIHS  --   --  7  --   --   --   --   --    < > = values in this interval not displayed.    Estimated Creatinine Clearance: 70.4 mL/min (by C-G formula based on SCr of 0.85 mg/dL).   Medical History: Past Medical History:  Diagnosis Date  . Asthma   . Atypical squamous cells of undetermined significance (ASCUS) on Papanicolaou smear of cervix   . Bed bug bite 07/12/2019  . COPD (chronic obstructive pulmonary disease) ([place])   . H/O drainage of abscess    left axilla, from left breast  . Hep B w/o coma   . HIV (human immunodeficiency virus infection) ([place])   . Hyperlipidemia   . Lung cancer ([place]) 12/14/13   LUL Adenocarcinoma  . Neuromuscular disorder ([place])    fingers/feet neuropathy  . Non-small cell lung cancer ([place])   . 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 Pharmacy was consulted to transition pt's anticoagulation from apixaban to IV heparin (starting on 5/28) in anticipation of neurosurgery on 6/1.   Pt has enlarging brain lesion with vasogenic edemia and mass effect; neurosurgery planning operative resection on 6/1.  Other medical hx includes: hx of lung cancer of LUL (S/P surgery/radiation, no recurrence), HIV, COPD/asthma,  nonhemorrhagic stroke 9/20, mild AKI on CKDIII, acute encephalopathy, acute respiratory failure on chronic hypoxic respiratory failure, HTN, OSA, chronic tobacco abuse.  Given pt's recent apixaban therapy, will monitor anticoagulation using aPTT until aPTT and heparin levels correlate.  Initial aPTT and heparin levels ~8 hrs after starting heparin infusion (no bolus) at 1200 units/hr were 80 sec and 0.98 units/ml, respectively. aPTT is within goal range; heparin level is elevated, likely due to recent apixaban therapy. H/H 16.4/54.0, plt 177 yesterday (clumped on sample today). Per RN, no issues with IV or bleeding observed.  Goal of Therapy:  aPTT 66-102 sec Heparin level 0.3-0.7 units/ml Monitor platelets by anticoagulation protocol: Yes   Plan:  Continue heparin infusion at 1200 units/hr Check aPTT, heparin level in ~7 hrs  Monitor daily aPTT, heparin level, CBC Monitor for signs/symptoms of bleeding Per Neurosurgery, plan to stop heparin at 0700 on 6/1 for surgery  Gillermina Hu, PharmD, BCPS, [facility] Clinical Pharmacist 11/24/2019,8:57 PM

## 2019-11-24 NOTE — Plan of Care (Signed)

## 2019-11-25 ENCOUNTER — Encounter (HOSPITAL_COMMUNITY): Payer: Self-pay | Admitting: Internal Medicine

## 2019-11-25 LAB — BASIC METABOLIC PANEL
Anion gap: 8 (ref 5–15)
BUN: 18 mg/dL (ref 8–23)
CO2: 31 mmol/L (ref 22–32)
Calcium: 9.1 mg/dL (ref 8.9–10.3)
Chloride: 103 mmol/L (ref 98–111)
Creatinine, Ser: 0.83 mg/dL (ref 0.44–1.00)
GFR calc Af Amer: >60 mL/min (ref >60)
GFR calc non Af Amer: >60 mL/min (ref >60)
Glucose, Bld: 109 mg/dL — ABNORMAL HIGH (ref 70–99)
Potassium: 3.8 mmol/L (ref 3.5–5.1)
Sodium: 142 mmol/L (ref 135–145)

## 2019-11-25 LAB — CBC
HCT: 51.6 % — ABNORMAL HIGH (ref 36.0–46.0)
Hemoglobin: 15.3 g/dL — ABNORMAL HIGH (ref 12.0–15.0)
MCH: 27.7 pg (ref 26.0–34.0)
MCHC: 29.7 g/dL — ABNORMAL LOW (ref 30.0–36.0)
MCV: 93.3 fL (ref 80.0–100.0)
Platelets: 201 10*3/uL (ref 150–400)
RBC: 5.53 MIL/uL — ABNORMAL HIGH (ref 3.87–5.11)
RDW: 15.7 % — ABNORMAL HIGH (ref 11.5–15.5)
WBC: 5 10*3/uL (ref 4.0–10.5)
nRBC: 0 % (ref 0.0–0.2)

## 2019-11-25 LAB — APTT: aPTT: 92 seconds — ABNORMAL HIGH (ref 24–36)

## 2019-11-25 LAB — HEPARIN LEVEL (UNFRACTIONATED): Heparin Unfractionated: 0.95 IU/mL — ABNORMAL HIGH (ref 0.30–0.70)

## 2019-11-25 MED ORDER — BISACODYL 10 MG RE SUPP
10.0000 mg | Freq: Once | RECTAL | Status: AC
  Administered 2019-11-25: 10 mg via RECTAL
  Filled 2019-11-25: qty 1

## 2019-11-25 MED ORDER — FLEET ENEMA 7-19 GM/118ML RE ENEM: 1.0000 | ENEMA | Freq: Every day | RECTAL | Status: DC | PRN

## 2019-11-25 MED ORDER — POTASSIUM CHLORIDE CRYS ER 20 MEQ PO TBCR
40.0000 meq | EXTENDED_RELEASE_TABLET | Freq: Once | ORAL | Status: AC
  Administered 2019-11-25: 40 meq via ORAL
  Filled 2019-11-25: qty 2

## 2019-11-25 NOTE — Progress Notes (Signed)
[facility] PROGRESS NOTE   [name] UTM:[id number] DOB: Sep 01, 1945 DOA: 11/21/2019  PCP: Nolene Ebbs, MD  Brief History/Interval Summary: [name] is a 74 y.o. 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 Per ems patient was found face down, but noted to able to give some history of her fall and noted complaints of pain on her left hip and back and denied any LOC at that time. Per EMS patient was found to be unkempt and her room was note to be squalid condition. At the scene her blood pressure as 80/50, sat was 87% she was off her home O2.She was treated with 500 ns en route.  ED Course:  In the ED patient was noted to be lethargic and was unable to give history initial and worsen ensured related to change in mental status.    Evaluation was positive for a brain lesion.  Patient was hospitalized for further management.   Consultants: Neurosurgery. Phone conversation with neurology  Procedures: EEG  Antibiotics: Anti-infectives (From admission, onward)   Start     Dose/Rate Route Frequency Ordered Stop   11/21/19 2000  bictegravir-emtricitabine-tenofovir AF (BIKTARVY) 50-200-25 MG per tablet 1 tablet     1 tablet Oral Daily 11/21/19 1953        Subjective/Interval History: Patient states that she has not had any bowel movement yet.  This is despite being started on laxatives yesterday.  Continues to have pain in the rib cage area.  No headaches.  ROS: Denies any  shortness of breath.    Assessment/Plan:  Enlarging Brain lesion with vasogenic edema and Mass effect  There was concern for seizure activity.  Based on H&P this issue was discussed with a neurologist who did not recommend steroids or antiepileptic drugs at this time.  EEG did not show any epileptiform activity. Neurosurgery is following.  They recommended staging work-up due to patient's previous history of lung cancer.  Patient previously seen by Dr. [name].  CT scan of the chest abdomen pelvis does not show any evidence for either primary tumor or metastatic process.   Neurosurgery is planning an operative resection.  This will apparently be done on Tuesday.  Patient however will need to stay in the hospital for the duration as she is not thought to be safe to return home and come back as an outpatient.  Her anticoagulation has been discontinued.  She will be started on IV heparin bridge per pharmacy.  Discussed with Dr. Venetia Constable.  Plan to stop the heparin infusion at 7 AM on June 1.   Patient remained stable from a neurological standpoint.  Acute Encephalopathy, other Seems to be back to her baseline.  Could be secondary to fall, dehydration, brain lesion.  Acute hypoxic/hypercarbic respiratory failure on chronic hypoxic respiratory failure  Possibly related to noncompliance with home oxygen.  She is on home oxygen at 3 to 4 L/min.  Remains stable.  Hx of Lung CA  This involved her left upper lobe.  Appears that she underwent ?SRS and radiation treatment.  No  evidence for recurrence on CT scan.  Mild AKI on CKDIII Resolved with IV hydration.  Hypokalemia Both potassium and magnesium were repleted.  Give additional dose today.  HIV  Continue antiretrovirals.  Fall ,Mechanical No injuries noted on work-up.-no fx on noted on work up.  History of COPD /Asthma Seems to be stable.  Continue home medication regimen.  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  This will need to be discontinued for her neurosurgery as discussed above. Discussed with Dr. Leonie Man with stroke service.  He recommends stopping apixaban and bridging with IV heparin about 3 to 4 days prior to surgery.  Currently being bridged with IV heparin. Anticoagulation to be reinitiated after surgery based on neurosurgery input.  Hx of CVA, nonhemorrhagic 9/20 [place] of left sided weakness.  Patient on apixaban.  See above.   History of essential hypertension Patient's blood pressure were elevated initially.  Now she is noted to be normotensive.  Continue to monitor.  Leave her off of medications for now.    OSA  CPAP qhs   Chronic Tobacco abuse  Patient continue to smoke daily. Nicotine patch /encourage cessation    DVT Prophylaxis: Apixaban Code Status: Full code Family Communication: Discussed with the patient.  No family at bedside. Disposition Plan:  Status is: Inpatient  Remains inpatient appropriate because:Ongoing diagnostic testing needed not appropriate for outpatient work up   Dispo: The patient is from: Home              Anticipated d/c is to: To be determined              Anticipated d/c date is: Next week after surgery              Patient currently is not medically stable to d/c.    Medications:  Scheduled: . bictegravir-emtricitabine-tenofovir AF  1 tablet Oral Daily  . fluticasone furoate-vilanterol  1 puff Inhalation Daily  . gabapentin  800 mg Oral TID  . loratadine  10 mg Oral BID  . montelukast  10 mg Oral QHS  . polyethylene glycol  17 g Oral BID  . QUEtiapine Fumarate  150 mg Oral QHS  . rOPINIRole  1 mg Oral QHS  . senna  2 tablet Oral QHS  . sodium chloride flush  3 mL Intravenous Once  . umeclidinium bromide  1 puff Inhalation Daily  . venlafaxine XR  150 mg Oral Daily  . vitamin C  125 mg Oral Daily   Continuous: . heparin 1,200 Units/hr (11/25/19 0705)    ZOX:[id number] **OR** acetaminophen, albuterol, diclofenac Sodium, HYDROcodone-acetaminophen, LORazepam, ondansetron **OR** ondansetron (ZOFRAN) IV, sodium phosphate   Objective:  Vital Signs  Vitals:   11/25/19 0048 11/25/19 0300 11/25/19 0745 11/25/19 0757  BP:  (!) 98/58  (!) 122/49  Pulse:  72  73  Resp:  17  16  Temp:  98.1 F (36.7 C)  97.9 F (36.6 C)  TempSrc:  Oral  Oral  SpO2: 98%  97% 99%  Weight:      Height:        Intake/Output Summary (Last 24 hours) at 11/25/2019 1048 Last data filed at 11/25/2019 0618 Gross per 24 hour  Intake 150.2 ml  Output 1175 ml  Net -1024.8 ml   Filed Weights   11/22/19 1900 11/24/19 0500  Weight: 73.5 kg 87 kg    General appearance: Awake alert.  In no  distress Resp: Clear to auscultation bilaterally.  Normal effort Cardio: S1-S2 is normal regular.  No S3-S4.  No rubs murmurs or bruit GI: Abdomen is soft.  Nontender nondistended.  Bowel sounds are present normal.  No masses organomegaly Extremities: No edema.  Full range of motion of lower extremities. Neurologic:   No focal neurological deficits.       Lab Results:  Data Reviewed: I have personally reviewed following labs and imaging studies  CBC: Recent Labs  Lab 11/21/19 0628 11/21/19 0919 11/21/19 2016 11/22/19 0220 11/23/19 0331 11/24/19 0444 11/25/19 0732  WBC 6.2  --   --  4.4 4.1 4.2 5.0  HGB 14.3   < > 16.0* 13.5 16.0* 16.4* 15.3*  HCT 48.3*   < > 47.0* 46.8* 53.6* 54.0* 51.6*  MCV 94.3  --   --  96.7 93.9 93.8 93.3  PLT 208  --   --  173 177 PLATELET CLUMPS NOTED ON SMEAR, UNABLE TO ESTIMATE 201   < > = values in this interval not displayed.    Basic Metabolic Panel: Recent Labs  Lab 11/22/19 0809 11/22/19 0856 11/22/19 1819 11/23/19 0331 11/24/19 0444 11/25/19 0732  NA 146*  --  137 140 140 142  K 2.7*  --  4.7 4.5 4.5 3.8  CL 114*  --  105 101 102 103  CO2 22  --  26 27 27 31   GLUCOSE 82  --  107* 96 91 109*  BUN 14  --  13 11 19  18   CREATININE 0.54  --  0.97 0.81 0.85 0.83  CALCIUM 6.9*  --  8.7* 9.1 9.3 9.1  MG  --  1.3*  --  1.9  --   --     GFR: Estimated Creatinine Clearance: 72.1 mL/min (by C-G formula based on SCr of 0.83 mg/dL).  Liver Function Tests: Recent Labs  Lab 11/21/19 0628 11/22/19 0220 11/22/19 0809  AST 31 18 20   ALT 18 13 13   ALKPHOS 50 34* 42  BILITOT 0.5 0.4 0.1*  PROT 6.7 4.7* 5.0*  ALBUMIN 3.0* 2.1* 2.1*     Cardiac Enzymes: Recent Labs  Lab 11/21/19 0628  CKTOTAL 243*      Recent Results (from the past 240 hour(s))  Blood culture (routine x 2)     Status: None (Preliminary result)   Collection Time: 11/21/19 10:30 AM   Specimen: BLOOD LEFT ARM  Result Value Ref Range Status   Specimen Description BLOOD LEFT ARM  Final   Special Requests   Final    BOTTLES DRAWN AEROBIC AND ANAEROBIC Blood Culture results may not be optimal due to an inadequate volume of blood received in culture bottles   Culture   Final    NO GROWTH 4 DAYS Performed at [facility], [place] [address] [place], [place] [address]    Report Status PENDING  Incomplete  SARS Coronavirus 2 by RT PCR (hospital order, performed in [facility] hospital lab) Nasopharyngeal Nasopharyngeal Swab     Status: None   Collection Time: 11/21/19 12:32 PM   Specimen: Nasopharyngeal Swab  Result Value Ref Range Status   SARS Coronavirus 2 NEGATIVE NEGATIVE Final    Comment: (NOTE) SARS-CoV-2 target nucleic acids are NOT DETECTED. The SARS-CoV-2 RNA is generally detectable in upper and lower respiratory specimens during the acute phase of infection. The lowest concentration of SARS-CoV-2 viral copies this assay can detect is 250 copies / mL. A negative result does not preclude SARS-CoV-2 infection  and should not be used as the sole basis for treatment or other patient management decisions.  A negative result may occur with improper specimen collection / handling, submission of specimen other than  nasopharyngeal swab, presence of viral mutation(s) within the areas targeted by this assay, and inadequate number of viral copies (<250 copies / mL). A negative result must be combined with clinical observations, patient history, and epidemiological information. Fact Sheet for Patients:   StrictlyIdeas.no Fact Sheet for Healthcare Providers: BankingDealers.co.za This test is not yet approved or cleared  by the [place] FDA and has been authorized for detection and/or diagnosis of SARS-CoV-2 by FDA under an Emergency Use Authorization (EUA).  This EUA will remain in effect (meaning this test can be used) for the duration of the COVID-19 declaration under Section 564(b)(1) of the Act, 21 U.S.C. section 360bbb-3(b)(1), unless the authorization is terminated or revoked sooner. Performed at [facility], [place] [address] [place], [place] [address]   Blood culture (routine x 2)     Status: None (Preliminary result)   Collection Time: 11/21/19  5:49 PM   Specimen: BLOOD  Result Value Ref Range Status   Specimen Description BLOOD SITE NOT SPECIFIED  Final   Special Requests   Final    BOTTLES DRAWN AEROBIC AND ANAEROBIC Blood Culture results may not be optimal due to an inadequate volume of blood received in culture bottles   Culture   Final    NO GROWTH 4 DAYS Performed at [facility], [place] [address] [place], [place] [address]    Report Status PENDING  Incomplete      Radiology Studies: No results found.     LOS: 4 days   Nichole Neyer [employer] on [email or web address]  11/25/2019, 10:48 AM

## 2019-11-25 NOTE — Plan of Care (Signed)
?  Problem: Clinical Measurements: ?Goal: Respiratory complications will improve ?Outcome: Progressing ?  ?Problem: Activity: ?Goal: Risk for activity intolerance will decrease ?Outcome: Progressing ?  ?Problem: Nutrition: ?Goal: Adequate nutrition will be maintained ?Outcome: Progressing ?  ?Problem: Coping: ?Goal: Level of anxiety will decrease ?Outcome: Progressing ?  ?

## 2019-11-25 NOTE — Progress Notes (Signed)
  NEUROSURGERY PROGRESS NOTE   No issues overnight. No concerns this am Up eating breakfast  EXAM:  BP (!) 122/49 (BP Location: Right Arm)   Pulse 73   Temp 97.9 F (36.6 C) (Oral)   Resp 16   Ht 5\' 9"  (1.753 m)   Wt 87 kg   SpO2 99%   BMI 28.32 kg/m   Awake, alert Eating breakfast 4/5 LUE/LLE, 5/4 RUE/RLE  IMPRESSION/PLAN 74 y.o. 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 MRI brain w/ R convexity 3.5x2.4cm enhancing mass concerning for metastasis versus rapidly growing meningioma. CT CAP without evidence of primary. 5/27 vCTH done. - OR 6/1 for resectopn

## 2019-11-25 NOTE — Progress Notes (Signed)
ANTICOAGULATION CONSULT NOTE - Follow Up Consult  Pharmacy Consult for  IV Heparin Indication: PE, Embolus in R Aortic Arch; R Renal and Splenic Infarcts  No Known Allergies  Patient Measurements: Height: 5\' 9"  (175.3 cm) Weight: 87 kg (191 lb 12.8 oz) IBW/kg (Calculated) : 66.2 Heparin Dosing Weight: 73.5 kg  Vital Signs: Temp: 97.9 F (36.6 C) (05/29 0757) Temp Source: Oral (05/29 0757) BP: 122/49 (05/29 0757) Pulse Rate: 73 (05/29 0757)  Labs: Recent Labs    11/23/19 0331 11/23/19 0331 11/24/19 0444 11/24/19 2004 11/25/19 0732  HGB 16.0*   < > 16.4*  --  15.3*  HCT 53.6*  --  54.0*  --  51.6*  PLT 177  --  PLATELET CLUMPS NOTED ON SMEAR, UNABLE TO ESTIMATE  --  201  APTT  --   --   --  80* 92*  HEPARINUNFRC  --   --   --  0.98* 0.95*  CREATININE 0.81  --  0.85  --  0.83   < > = values in this interval not displayed.    Estimated Creatinine Clearance: 72.1 mL/min (by C-G formula based on SCr of 0.83 mg/dL).   Medical History: Past Medical History:  Diagnosis Date  . Asthma   . Atypical squamous cells of undetermined significance (ASCUS) on Papanicolaou smear of cervix   . Bed bug bite 07/12/2019  . COPD (chronic obstructive pulmonary disease) ([place])   . H/O drainage of abscess    left axilla, from left breast  . Hep B w/o coma   . HIV (human immunodeficiency virus infection) ([place])   . Hyperlipidemia   . Lung cancer ([place]) 12/14/13   LUL Adenocarcinoma  . Neuromuscular disorder ([place])    fingers/feet neuropathy  . Non-small cell lung cancer ([place])   . 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 Pharmacy was consulted to transition pt's anticoagulation from apixaban to IV heparin (starting on 5/28) in anticipation of neurosurgery on 6/1.   Pt has enlarging brain lesion with  vasogenic edemia and mass effect; neurosurgery planning operative resection on 6/1.  Given pt's recent apixaban therapy, will monitor anticoagulation using aPTT until aPTT and heparin levels correlate.   aPTT is within goal range (92); heparin level is elevated (0.95), likely due to recent apixaban therapy. H/H 15.3/51.6, plt 201 today.   Goal of Therapy:  aPTT 66-102 sec Heparin level 0.3-0.7 units/ml Monitor platelets by anticoagulation protocol: Yes   Plan:  Continue heparin infusion at 1200 units/hr Monitor daily aPTT, heparin level, CBC Monitor for signs/symptoms of bleeding Per Neurosurgery, plan to stop heparin at 0700 on 6/1 for surgery  Leston Schueller A. Levada Dy, PharmD, BCPS, FNK[facility] Clinical Pharmacist [place] Please utilize Amion for appropriate phone number to reach the unit pharmacist ([place])   11/25/2019,9:01 AM

## 2019-11-25 NOTE — Progress Notes (Signed)
RT placed pt on CPAP dream station for the night on her home setting of 8 cmH2O and 3 Lpm bled into the system. Pt respiratory status is stable at this time. Pt in no distress. RT will continue to monitor.

## 2019-11-26 DIAGNOSIS — G939 Disorder of brain, unspecified: Secondary | ICD-10-CM

## 2019-11-26 LAB — BASIC METABOLIC PANEL
Anion gap: 7 (ref 5–15)
BUN: 18 mg/dL (ref 8–23)
CO2: 30 mmol/L (ref 22–32)
Calcium: 9 mg/dL (ref 8.9–10.3)
Chloride: 103 mmol/L (ref 98–111)
Creatinine, Ser: 0.83 mg/dL (ref 0.44–1.00)
GFR calc Af Amer: >60 mL/min (ref >60)
GFR calc non Af Amer: >60 mL/min (ref >60)
Glucose, Bld: 99 mg/dL (ref 70–99)
Potassium: 4.3 mmol/L (ref 3.5–5.1)
Sodium: 140 mmol/L (ref 135–145)

## 2019-11-26 LAB — CBC
HCT: 50.1 % — ABNORMAL HIGH (ref 36.0–46.0)
Hemoglobin: 15 g/dL (ref 12.0–15.0)
MCH: 27.9 pg (ref 26.0–34.0)
MCHC: 29.9 g/dL — ABNORMAL LOW (ref 30.0–36.0)
MCV: 93.1 fL (ref 80.0–100.0)
Platelets: 208 10*3/uL (ref 150–400)
RBC: 5.38 MIL/uL — ABNORMAL HIGH (ref 3.87–5.11)
RDW: 15.8 % — ABNORMAL HIGH (ref 11.5–15.5)
WBC: 4.9 10*3/uL (ref 4.0–10.5)
nRBC: 0 % (ref 0.0–0.2)

## 2019-11-26 LAB — CULTURE, BLOOD (ROUTINE X 2)
Culture: NO GROWTH
Culture: NO GROWTH

## 2019-11-26 LAB — HEPARIN LEVEL (UNFRACTIONATED): Heparin Unfractionated: 0.69 IU/mL (ref 0.30–0.70)

## 2019-11-26 LAB — APTT: aPTT: 86 seconds — ABNORMAL HIGH (ref 24–36)

## 2019-11-26 NOTE — Progress Notes (Signed)
Subjective: Patient reports doing ok.  Concerned about her COPD.  Objective: Vital signs in last 24 hours: Temp:  [97.6 F (36.4 C)-98.3 F (36.8 C)] 98.3 F (36.8 C) (05/30 0811) Pulse Rate:  [72-86] 78 (05/30 0924) Resp:  [14-20] 20 (05/30 0924) BP: (104-127)/(58-76) 123/58 (05/30 0811) SpO2:  [90 %-98 %] 96 % (05/30 0924)  Intake/Output from previous day: No intake/output data recorded. Intake/Output this shift: No intake/output data recorded.  Physical Exam: Awake, alert, conversant.  MAEW.    Lab Results: Recent Labs    11/25/19 0732 11/26/19 0453  WBC 5.0 4.9  HGB 15.3* 15.0  HCT 51.6* 50.1*  PLT 201 208   BMET Recent Labs    11/25/19 0732 11/26/19 0453  NA 142 140  K 3.8 4.3  CL 103 103  CO2 31 30  GLUCOSE 109* 99  BUN 18 18  CREATININE 0.83 0.83  CALCIUM 9.1 9.0    Studies/Results: No results found.  Assessment/Plan: Patient is stable.  Plan on surgery 11/28/19.    LOS: 5 days    Peggyann Shoals, MD 11/26/2019, 9:57 AM

## 2019-11-26 NOTE — Progress Notes (Signed)
[facility] PROGRESS NOTE   [name] VHQ:[id number] DOB: 08-25-1945 DOA: 11/21/2019  PCP: Michelle Ebbs, MD  Brief History/Interval Summary: [name] is a 74 y.o. 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 Per ems patient was found face down, but noted to able to give some history of her fall and noted complaints of pain on her left hip and back and denied any LOC at that time. Per EMS patient was found to be unkempt and her room was note to be squalid condition. At the scene her blood pressure as 80/50, sat was 87% she was off her home O2.She was treated with 500 ns en route.  ED Course:  In the ED patient was noted to be lethargic and was unable to give history initial and worsen ensured related to change in mental status.    Evaluation was positive for a brain lesion.  Patient was hospitalized for further management.   Consultants: Neurosurgery. Phone conversation with neurology  Procedures: EEG  Antibiotics: Anti-infectives (From admission, onward)   Start     Dose/Rate Route Frequency Ordered Stop   11/21/19 2000  bictegravir-emtricitabine-tenofovir AF (BIKTARVY) 50-200-25 MG per tablet 1 tablet     1 tablet Oral Daily 11/21/19 1953        Subjective/Interval History: Patient states that she had a large bowel movement yesterday.  Feels much better.  No significant headaches.  No weakness in her arms or legs.  ROS: Denies any shortness of  breath.    Assessment/Plan:  Enlarging Brain lesion with vasogenic edema and Mass effect  There was concern for seizure activity.  Based on H&P this issue was discussed with a neurologist who did not recommend steroids or antiepileptic drugs at this time.  EEG did not show any epileptiform activity. Neurosurgery is following.  They recommended staging work-up due to patient's previous history of lung cancer.  Patient previously seen by Dr. Julien Day.  CT scan of the chest abdomen pelvis does not show any evidence for either primary tumor or metastatic process.   Neurosurgery is planning an operative resection.  This will apparently be done on Tuesday.  Patient however will need to stay in the hospital for the duration as she is not thought to be safe to return home and come back as an outpatient.  Her anticoagulation has been discontinued.  She will be started on IV heparin bridge per pharmacy.  Discussed with Dr. Venetia Day.  Plan to stop the heparin infusion at 7 AM on June 1.   Patient remains stable from a neurological standpoint.  Acute Encephalopathy, other Seems to be back to her baseline.  Could be secondary to fall, dehydration, brain lesion.  Acute hypoxic/hypercarbic respiratory failure on chronic hypoxic respiratory failure  Possibly related to noncompliance with home oxygen.  She is on home oxygen at 3 to 4 L/min.  Remains stable.  Hx of Lung CA  This involved her left upper lobe.  Appears that she underwent ?SRS and radiation treatment.  No evidence for recurrence on CT scan.  Mild AKI on CKDIII Resolved with IV hydration.  Hypokalemia Both potassium and magnesium were repleted.    HIV  Continue antiretrovirals.  Fall ,Mechanical No injuries noted on work-up.-no fx on noted on work up.  History of COPD /Asthma Seems to be stable.  Continue home medication regimen.  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  This will need to be discontinued for her neurosurgery as discussed above. Discussed with [name] with stroke service.  He recommends stopping apixaban and bridging with IV heparin about 3 to 4 days prior to surgery.  Currently being bridged with IV heparin. Anticoagulation to be reinitiated after surgery based on neurosurgery input.  Hx of CVA, nonhemorrhagic 9/20 [place] of left sided weakness.  Patient on apixaban.  See above.   History of essential hypertension Patient's blood pressure were elevated initially.  Now she is noted to be normotensive.  Continue to monitor.  Leave her off of medications for now.    OSA  CPAP qhs   Chronic Tobacco abuse  Patient continue to smoke daily. Nicotine patch /encourage cessation   Constipation Resolved with laxatives.   DVT Prophylaxis: Apixaban Code Status: Full code Family Communication: Discussed with the patient.  No family at bedside. Disposition Plan:  Status is: Inpatient  Remains inpatient appropriate because:Ongoing diagnostic testing needed not appropriate for outpatient work up   Dispo: The patient is from: Home              Anticipated d/c is to: To be determined              Anticipated d/c date is: Next week after surgery              Patient currently is not medically stable to d/c.    Medications:  Scheduled: . bictegravir-emtricitabine-tenofovir AF  1 tablet Oral Daily  . fluticasone furoate-vilanterol  1 puff Inhalation Daily  . gabapentin  800 mg Oral TID  . loratadine  10 mg Oral BID  . montelukast  10 mg Oral QHS  . polyethylene glycol  17 g Oral BID  . QUEtiapine Fumarate  150 mg Oral QHS  . rOPINIRole  1 mg Oral QHS  . senna  2 tablet Oral QHS  . sodium chloride flush  3 mL Intravenous Once  . umeclidinium bromide  1 puff Inhalation Daily  . venlafaxine XR  150 mg Oral Daily  . vitamin C  125 mg Oral Daily   Continuous: . heparin 1,200 Units/hr (11/26/19  0422)   YTK:[id number] **OR** acetaminophen, albuterol, diclofenac Sodium, HYDROcodone-acetaminophen, LORazepam, ondansetron **OR** ondansetron (ZOFRAN) IV, sodium phosphate   Objective:  Vital Signs  Vitals:   11/25/19 2339 11/26/19 0357 11/26/19 0811 11/26/19 0924  BP: 122/76 104/67 (!) 123/58   Pulse: 81 86 72 78  Resp: 20 18 16 20   Temp: 98.2 F (36.8 C) 98 F (36.7 C) 98.3 F (36.8 C)   TempSrc: Oral Oral Oral   SpO2: 90% 94% 95% 96%  Weight:      Height:       No intake or output data in the 24 hours ending 11/26/19 0940 Filed Weights   11/22/19 1900 11/24/19 0500  Weight: 73.5 kg 87 kg    General appearance: Awake alert.  In no distress Resp: Clear to auscultation bilaterally.  Normal effort Cardio: S1-S2 is normal regular.  No S3-S4.  No rubs murmurs or bruit GI:  Abdomen is soft.  Nontender nondistended.  Bowel sounds are present normal.  No masses organomegaly Extremities: No edema.  Moving all her extremities Neurologic: No focal neurological deficits.        Lab Results:  Data Reviewed: I have personally reviewed following labs and imaging studies  CBC: Recent Labs  Lab 11/22/19 0220 11/23/19 0331 11/24/19 0444 11/25/19 0732 11/26/19 0453  WBC 4.4 4.1 4.2 5.0 4.9  HGB 13.5 16.0* 16.4* 15.3* 15.0  HCT 46.8* 53.6* 54.0* 51.6* 50.1*  MCV 96.7 93.9 93.8 93.3 93.1  PLT 173 177 PLATELET CLUMPS NOTED ON SMEAR, UNABLE TO ESTIMATE 201 272    Basic Metabolic Panel: Recent Labs  Lab 11/22/19 0809 11/22/19 0856 11/22/19 1819 11/23/19 0331 11/24/19 0444 11/25/19 0732 11/26/19 0453  NA   < >  --  137 140 140 142 140  K   < >  --  4.7 4.5 4.5 3.8 4.3  CL   < >  --  105 101 102 103 103  CO2   < >  --  26 27 27 31 30   GLUCOSE   < >  --  107* 96 91 109* 99  BUN   < >  --  13 11 19 18 18   CREATININE   < >  --  0.97 0.81 0.85 0.83 0.83  CALCIUM   < >  --  8.7* 9.1 9.3 9.1 9.0  MG  --  1.3*  --  1.9  --   --   --    < > = values in this interval  not displayed.    GFR: Estimated Creatinine Clearance: 72.1 mL/min (by C-G formula based on SCr of 0.83 mg/dL).  Liver Function Tests: Recent Labs  Lab 11/21/19 0628 11/22/19 0220 11/22/19 0809  AST 31 18 20   ALT 18 13 13   ALKPHOS 50 34* 42  BILITOT 0.5 0.4 0.1*  PROT 6.7 4.7* 5.0*  ALBUMIN 3.0* 2.1* 2.1*     Cardiac Enzymes: Recent Labs  Lab 11/21/19 0628  CKTOTAL 243*      Recent Results (from the past 240 hour(s))  Blood culture (routine x 2)     Status: None   Collection Time: 11/21/19 10:30 AM   Specimen: BLOOD LEFT ARM  Result Value Ref Range Status   Specimen Description BLOOD LEFT ARM  Final   Special Requests   Final    BOTTLES DRAWN AEROBIC AND ANAEROBIC Blood Culture results may not be optimal due to an inadequate volume of blood received in culture bottles   Culture   Final    NO GROWTH 5 DAYS Performed at [facility], [place] [address], [place], [place] [address]    Report Status 11/26/2019 FINAL  Final  SARS Coronavirus 2 by RT PCR (hospital order, performed in [facility] hospital lab) Nasopharyngeal Nasopharyngeal Swab     Status: None   Collection Time: 11/21/19 12:32 PM   Specimen: Nasopharyngeal Swab  Result Value Ref Range Status   SARS Coronavirus 2 NEGATIVE NEGATIVE Final    Comment: (NOTE) SARS-CoV-2 target nucleic acids are NOT DETECTED. The SARS-CoV-2 RNA is generally detectable in upper and lower respiratory specimens during the acute phase of infection. The lowest concentration of SARS-CoV-2 viral copies this assay can detect is 250 copies / mL. A negative result does not preclude SARS-CoV-2 infection and should not be used as the sole basis for treatment or other patient management decisions.  A negative result may occur with improper  specimen collection / handling, submission of specimen other than nasopharyngeal swab, presence of viral mutation(s) within the areas targeted by this assay, and inadequate number of viral  copies (<250 copies / mL). A negative result must be combined with clinical observations, patient history, and epidemiological information. Fact Sheet for Patients:   StrictlyIdeas.no Fact Sheet for Healthcare Providers: BankingDealers.co.za This test is not yet approved or cleared  by the [place] FDA and has been authorized for detection and/or diagnosis of SARS-CoV-2 by FDA under an Emergency Use Authorization (EUA).  This EUA will remain in effect (meaning this test can be used) for the duration of the COVID-19 declaration under Section 564(b)(1) of the Act, 21 U.S.C. section 360bbb-3(b)(1), unless the authorization is terminated or revoked sooner. Performed at [facility], [place] [address] [place], [place] [address]   Blood culture (routine x 2)     Status: None   Collection Time: 11/21/19  5:49 PM   Specimen: BLOOD  Result Value Ref Range Status   Specimen Description BLOOD SITE NOT SPECIFIED  Final   Special Requests   Final    BOTTLES DRAWN AEROBIC AND ANAEROBIC Blood Culture results may not be optimal due to an inadequate volume of blood received in culture bottles   Culture   Final    NO GROWTH 5 DAYS Performed at [facility], [place] [address], [place], [place] [address]    Report Status 11/26/2019 FINAL  Final      Radiology Studies: No results found.     LOS: 5 days   Bobette Leyh [employer] on [email or web address]  11/26/2019, 9:40 AM

## 2019-11-26 NOTE — Progress Notes (Signed)
ANTICOAGULATION CONSULT NOTE - Follow Up Consult  Pharmacy Consult for  IV Heparin Indication: PE, Embolus in R Aortic Arch; R Renal and Splenic Infarcts  No Known Allergies  Patient Measurements: Height: 5\' 9"  (175.3 cm) Weight: 87 kg (191 lb 12.8 oz) IBW/kg (Calculated) : 66.2 Heparin Dosing Weight: 73.5 kg  Vital Signs: Temp: 98 F (36.7 C) (05/30 0357) Temp Source: Oral (05/30 0357) BP: 104/67 (05/30 0357) Pulse Rate: 86 (05/30 0357)  Labs: Recent Labs    11/24/19 0444 11/24/19 0444 11/24/19 2004 11/25/19 0732 11/26/19 0453  HGB 16.4*   < >  --  15.3* 15.0  HCT 54.0*  --   --  51.6* 50.1*  PLT PLATELET CLUMPS NOTED ON SMEAR, UNABLE TO ESTIMATE  --   --  201 208  APTT  --   --  80* 92* 86*  HEPARINUNFRC  --   --  0.98* 0.95* 0.69  CREATININE 0.85  --   --  0.83 0.83   < > = values in this interval not displayed.    Estimated Creatinine Clearance: 72.1 mL/min (by C-G formula based on SCr of 0.83 mg/dL).   Medical History: Past Medical History:  Diagnosis Date  . Asthma   . Atypical squamous cells of undetermined significance (ASCUS) on Papanicolaou smear of cervix   . Bed bug bite 07/12/2019  . COPD (chronic obstructive pulmonary disease) ([place])   . H/O drainage of abscess    left axilla, from left breast  . Hep B w/o coma   . HIV (human immunodeficiency virus infection) ([place])   . Hyperlipidemia   . Lung cancer ([place]) 12/14/13   LUL Adenocarcinoma  . Neuromuscular disorder ([place])    fingers/feet neuropathy  . Non-small cell lung cancer ([place])   . S/P radiation therapy 01/26/14-02/02/14   sbrt  lt upper lung-54Gy/82fx   Assessment: 74 yr old female on apixaban 5 mg po BID for PE, embolus in R aortic arch, R renal infarct, splenic infarct in 02/2019 (on apixaban therapy for life). Pharmacy was consulted to transition pt's anticoagulation from apixaban to IV heparin (starting on 5/28) in anticipation of neurosurgery on 6/1.   Pt has enlarging brain lesion with  vasogenic edemia and mass effect; neurosurgery planning operative resection on 6/1.  Given pt's recent apixaban therapy, will monitor anticoagulation using aPTT until aPTT and heparin levels correlate.   aPTT is within goal range (86); heparin level is therapeutic (0.69), and will soon correlate with aPTT. Hgb 15, plt 208 today.   Goal of Therapy:  aPTT 66-102 sec Heparin level 0.3-0.7 units/ml Monitor platelets by anticoagulation protocol: Yes   Plan:  Continue heparin infusion at 1200 units/hr Monitor daily aPTT, heparin level, CBC Monitor for signs/symptoms of bleeding Per Neurosurgery, plan to stop heparin at 0700 on 6/1 for surgery  Aster Screws A. Levada Dy, PharmD, BCPS, FNKF Clinical Pharmacist  Please utilize Amion for appropriate phone number to reach the unit pharmacist ([place])   11/26/2019,7:01 AM

## 2019-11-26 NOTE — Plan of Care (Signed)
  Problem: Activity: Goal: Risk for activity intolerance will decrease Outcome: Progressing   Problem: Nutrition: Goal: Adequate nutrition will be maintained Outcome: Progressing   Problem: Pain Managment: Goal: General experience of comfort will improve Outcome: Progressing   Problem: Safety: Goal: Ability to remain free from injury will improve Outcome: Progressing   

## 2019-11-27 LAB — CBC
HCT: 51.4 % — ABNORMAL HIGH (ref 36.0–46.0)
Hemoglobin: 15.6 g/dL — ABNORMAL HIGH (ref 12.0–15.0)
MCH: 27.9 pg (ref 26.0–34.0)
MCHC: 30.4 g/dL (ref 30.0–36.0)
MCV: 91.9 fL (ref 80.0–100.0)
Platelets: 204 10*3/uL (ref 150–400)
RBC: 5.59 MIL/uL — ABNORMAL HIGH (ref 3.87–5.11)
RDW: 15.9 % — ABNORMAL HIGH (ref 11.5–15.5)
WBC: 5.4 10*3/uL (ref 4.0–10.5)
nRBC: 0 % (ref 0.0–0.2)

## 2019-11-27 LAB — BASIC METABOLIC PANEL
Anion gap: 6 (ref 5–15)
BUN: 20 mg/dL (ref 8–23)
CO2: 32 mmol/L (ref 22–32)
Calcium: 9.1 mg/dL (ref 8.9–10.3)
Chloride: 101 mmol/L (ref 98–111)
Creatinine, Ser: 0.95 mg/dL (ref 0.44–1.00)
GFR calc Af Amer: >60 mL/min (ref >60)
GFR calc non Af Amer: 60 mL/min — ABNORMAL LOW (ref >60)
Glucose, Bld: 102 mg/dL — ABNORMAL HIGH (ref 70–99)
Potassium: 4.5 mmol/L (ref 3.5–5.1)
Sodium: 139 mmol/L (ref 135–145)

## 2019-11-27 LAB — APTT: aPTT: 79 seconds — ABNORMAL HIGH (ref 24–36)

## 2019-11-27 LAB — HEPARIN LEVEL (UNFRACTIONATED): Heparin Unfractionated: 0.37 IU/mL (ref 0.30–0.70)

## 2019-11-27 MED ORDER — HEPARIN (PORCINE) 25000 UT/250ML-% IV SOLN
1200.0000 [IU]/h | INTRAVENOUS | Status: AC
  Administered 2019-11-28: 1200 [IU]/h via INTRAVENOUS
  Filled 2019-11-27: qty 250

## 2019-11-27 NOTE — Progress Notes (Signed)
ANTICOAGULATION CONSULT NOTE - Follow Up Consult  Pharmacy Consult for  IV Heparin Indication: PE, Embolus in R Aortic Arch; R Renal and Splenic Infarcts  No Known Allergies  Patient Measurements: Height: 5\' 9"  (175.3 cm) Weight: 87 kg (191 lb 12.8 oz) IBW/kg (Calculated) : 66.2 Heparin Dosing Weight: 73.5 kg  Vital Signs: Temp: 98.9 F (37.2 C) (05/31 0300) Temp Source: Oral (05/31 0300) BP: 105/64 (05/31 0300) Pulse Rate: 79 (05/31 0300)  Labs: Recent Labs    11/25/19 0732 11/25/19 0732 11/26/19 0453 11/27/19 0453  HGB 15.3*   < > 15.0 15.6*  HCT 51.6*  --  50.1* 51.4*  PLT 201  --  208 204  APTT 92*  --  86* 79*  HEPARINUNFRC 0.95*  --  0.69 0.37  CREATININE 0.83  --  0.83 0.95   < > = values in this interval not displayed.    Estimated Creatinine Clearance: 63 mL/min (by C-G formula based on SCr of 0.95 mg/dL).   Medical History: Past Medical History:  Diagnosis Date  . Asthma   . Atypical squamous cells of undetermined significance (ASCUS) on Papanicolaou smear of cervix   . Bed bug bite 07/12/2019  . COPD (chronic obstructive pulmonary disease) ([place])   . H/O drainage of abscess    left axilla, from left breast  . Hep B w/o coma   . HIV (human immunodeficiency virus infection) ([place])   . Hyperlipidemia   . Lung cancer ([place]) 12/14/13   LUL Adenocarcinoma  . Neuromuscular disorder ([place])    fingers/feet neuropathy  . Non-small cell lung cancer ([place])   . S/P radiation therapy 01/26/14-02/02/14   sbrt  lt upper lung-54Gy/61fx   Assessment: 74 yr old female on apixaban 5 mg po BID for PE, embolus in R aortic arch, R renal infarct, splenic infarct in 02/2019 (on apixaban therapy for life). Pharmacy was consulted to transition pt's anticoagulation from apixaban to IV heparin (starting on 5/28) in anticipation of neurosurgery on 6/1.   Pt has enlarging brain lesion with vasogenic edemia and mass effect; neurosurgery planning operative resection on 6/1.  Given pt's  recent apixaban therapy, will monitor anticoagulation using aPTT until aPTT and heparin levels correlate.   aPTT is within goal range (79); heparin level is therapeutic (0.37), and seems to correlate with aPTT. Hgb 15.6, plt 204 today.   Goal of Therapy:  aPTT 66-102 sec Heparin level 0.3-0.7 units/ml Monitor platelets by anticoagulation protocol: Yes   Plan:  Continue heparin infusion at 1200 units/hr Monitor daily heparin level, CBC Monitor for signs/symptoms of bleeding Per Neurosurgery, plan to stop heparin at 0700 on 6/1 for surgery  Dwayne A. Levada Dy, PharmD, BCPS, FNKF Clinical Pharmacist [place] Please utilize Amion for appropriate phone number to reach the unit pharmacist ([place])   11/27/2019,7:44 AM

## 2019-11-27 NOTE — Plan of Care (Signed)
  Problem: Activity: Goal: Risk for activity intolerance will decrease Outcome: Progressing   Problem: Nutrition: Goal: Adequate nutrition will be maintained Outcome: Progressing   Problem: Coping: Goal: Level of anxiety will decrease Outcome: Progressing   Problem: Pain Managment: Goal: General experience of comfort will improve Outcome: Progressing   Problem: Safety: Goal: Ability to remain free from injury will improve Outcome: Progressing   

## 2019-11-27 NOTE — Progress Notes (Signed)
[facility] PROGRESS NOTE   KAYLINN DEDIC KZL:[id number] DOB: 06/11/1946 DOA: 11/21/2019  PCP: Nolene Ebbs, MD  Brief History/Interval Summary: [name] is a 74 y.o. 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 Per ems patient was found face down, but noted to able to give some history of her fall and noted complaints of pain on her left hip and back and denied any LOC at that time. Per EMS patient was found to be unkempt and her room was note to be squalid condition. At the scene her blood pressure as 80/50, sat was 87% she was off her home O2.She was treated with 500 ns en route.  ED Course:  In the ED patient was noted to be lethargic and was unable to give history initial and worsen ensured related to change in mental status.    Evaluation was positive for a brain lesion.  Patient was hospitalized for further management.   Consultants: Neurosurgery. Phone conversation with neurology  Procedures: EEG  Antibiotics: Anti-infectives (From admission, onward)   Start     Dose/Rate Route Frequency Ordered Stop   11/21/19 2000  bictegravir-emtricitabine-tenofovir AF (BIKTARVY) 50-200-25 MG per tablet 1 tablet     1 tablet Oral Daily 11/21/19 1953        Subjective/Interval History: Patient mentions that she is feeling better this morning.  She had 2 bowel movements yesterday.  Denies any headaches.  The pain in the rib cage area is improving slowly.  ROS: Denies any shortness  of breath    Assessment/Plan:  Enlarging Brain lesion with vasogenic edema and Mass effect  There was concern for seizure activity.  Based on H&P this issue was discussed with a neurologist who did not recommend steroids or antiepileptic drugs at this time.  EEG did not show any epileptiform activity. Neurosurgery is following.  They recommended staging work-up due to patient's previous history of lung cancer.  Patient previously seen by Dr. [name].  CT scan of the chest abdomen pelvis does not show any evidence for either primary tumor or metastatic process.   Neurosurgery is planning an operative resection.  This will apparently be done on Tuesday.  Patient however will need to stay in the hospital for the duration as she is not thought to be safe to return home and come back as an outpatient.  Her anticoagulation has been discontinued.  She will be started on IV heparin bridge per pharmacy.  Discussed with Dr. Venetia Constable.  Plan to stop the heparin infusion at 7 AM on June 1.   Patient remains stable from neurological standpoint.  Acute Encephalopathy, other Seems to be back to her baseline.  Could be secondary to fall, dehydration, brain lesion.  Acute hypoxic/hypercarbic respiratory failure on chronic hypoxic respiratory failure  Possibly related to noncompliance with home oxygen.  She is on home oxygen at 3 to 4 L/min.  Remains stable.  Hx of Lung CA  This involved her left upper lobe.  Appears that she underwent ?SRS and radiation treatment.  No evidence for recurrence  on CT scan.  Mild AKI on CKDIII Resolved with IV hydration.  Hypokalemia Both potassium and magnesium were repleted.    HIV  Continue antiretrovirals.  Fall ,Mechanical No injuries noted on work-up.-no fx on noted on work up.  History of COPD /Asthma Seems to be stable.  Continue home medication regimen.  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  This will need to be discontinued for her neurosurgery as discussed above. Discussed with Dr. Leonie Man with stroke service.  He recommends stopping apixaban and bridging with IV heparin about 3 to 4 days prior to surgery.  Currently being bridged with IV heparin. Anticoagulation to be reinitiated after surgery based on neurosurgery input.  Hx of CVA, nonhemorrhagic 9/20 Sequela of left sided weakness.  Patient on apixaban.  See above.   History of essential hypertension Patient's blood pressure were elevated initially.  Now she is noted to be normotensive.  Continue to monitor.  Leave her off of medications for now.    OSA  CPAP qhs   Chronic Tobacco abuse  Patient continue to smoke daily. Nicotine patch /encourage cessation   Constipation Resolved with laxatives.   DVT Prophylaxis: Currently on IV heparin as a bridge Code Status: Full code Family Communication: Discussed with the patient.  No family at bedside. Disposition Plan:  Status is: Inpatient  Remains inpatient appropriate because:Ongoing diagnostic testing needed not appropriate for outpatient work up.  She will need to undergo resection of her brain lesion.   Dispo: The patient is from: Home              Anticipated d/c is to: To be determined              Anticipated d/c date is: Next week after surgery              Patient currently is not medically stable to d/c.    Medications:  Scheduled: . bictegravir-emtricitabine-tenofovir AF  1 tablet Oral Daily  . fluticasone furoate-vilanterol  1 puff Inhalation Daily  . gabapentin  800 mg Oral TID  . loratadine  10 mg Oral BID  . montelukast  10 mg Oral QHS  . polyethylene glycol  17 g Oral BID  . QUEtiapine Fumarate  150 mg Oral QHS  . rOPINIRole  1 mg Oral QHS  . senna  2 tablet Oral QHS  . sodium chloride flush  3 mL Intravenous Once  . umeclidinium bromide  1 puff Inhalation Daily  . venlafaxine XR  150 mg Oral Daily    . vitamin C  125 mg Oral Daily   Continuous: . heparin 1,200 Units/hr (11/27/19 0406)   CHE:NIDPOEUMPNTIR **OR** acetaminophen, albuterol, diclofenac Sodium, HYDROcodone-acetaminophen, LORazepam, ondansetron **OR** ondansetron (ZOFRAN) IV, sodium phosphate   Objective:  Vital Signs  Vitals:   11/26/19 2340 11/27/19 0300 11/27/19 0711 11/27/19 0832  BP: (!) 154/89 105/64  107/72  Pulse: 77 79  80  Resp:  16  16  Temp: 97.9 F (36.6 C) 98.9 F (37.2 C)  (!) 97.5 F (36.4 C)  TempSrc: Oral Oral  Oral  SpO2:   94% 93%  Weight:      Height:        Intake/Output Summary (Last 24 hours) at 11/27/2019 0936 Last data filed at 11/27/2019 0925 Gross per 24 hour  Intake 300 ml  Output 2425 ml  Net -2125 ml   [employer]  11/22/19 1900 11/24/19 0500  Weight: 73.5 kg 87 kg    General appearance: Awake alert.  In no distress Resp: Clear to auscultation bilaterally.  Normal effort Cardio: S1-S2 is normal regular.  No S3-S4.  No rubs murmurs or bruit GI: Abdomen is soft.  Nontender nondistended.  Bowel sounds are present normal.  No masses organomegaly Neurologic: Alert and oriented x3.  No focal neurological deficits.       Lab Results:  Data Reviewed: I have personally reviewed following labs and imaging studies  CBC: Recent Labs  Lab 11/23/19 0331 11/24/19 0444 11/25/19 0732 11/26/19 0453 11/27/19 0453  WBC 4.1 4.2 5.0 4.9 5.4  HGB 16.0* 16.4* 15.3* 15.0 15.6*  HCT 53.6* 54.0* 51.6* 50.1* 51.4*  MCV 93.9 93.8 93.3 93.1 91.9  PLT 177 PLATELET CLUMPS NOTED ON SMEAR, UNABLE TO ESTIMATE 201 208 081    Basic Metabolic Panel: Recent Labs  Lab 11/22/19 0856 11/22/19 1819 11/23/19 0331 11/24/19 0444 11/25/19 0732 11/26/19 0453 11/27/19 0453  NA  --    < > 140 140 142 140 139  K  --    < > 4.5 4.5 3.8 4.3 4.5  CL  --    < > 101 102 103 103 101  CO2  --    < > 27 27 31 30  32  GLUCOSE  --    < > 96 91 109* 99 102*  BUN  --    < > 11 19 18 18 20    CREATININE  --    < > 0.81 0.85 0.83 0.83 0.95  CALCIUM  --    < > 9.1 9.3 9.1 9.0 9.1  MG 1.3*  --  1.9  --   --   --   --    < > = values in this interval not displayed.    GFR: Estimated Creatinine Clearance: 63 mL/min (by C-G formula based on SCr of 0.95 mg/dL).  Liver Function Tests: Recent Labs  Lab 11/21/19 0628 11/22/19 0220 11/22/19 0809  AST 31 18 20   ALT 18 13 13   ALKPHOS 50 34* 42  BILITOT 0.5 0.4 0.1*  PROT 6.7 4.7* 5.0*  ALBUMIN 3.0* 2.1* 2.1*     Cardiac Enzymes: Recent Labs  Lab 11/21/19 0628  CKTOTAL 243*      Recent Results (from the past 240 hour(s))  Blood culture (routine x 2)     Status: None   Collection Time: 11/21/19 10:30 AM   Specimen: BLOOD LEFT ARM  Result Value Ref Range Status   Specimen Description BLOOD LEFT ARM  Final   Special Requests   Final    BOTTLES DRAWN AEROBIC AND ANAEROBIC Blood Culture results may not be optimal due to an inadequate volume of blood received in culture bottles   Culture   Final    NO GROWTH 5 DAYS Performed at [facility], [place] [address], [place], [place] [address]    Report Status 11/26/2019 FINAL  Final  SARS Coronavirus 2 by RT PCR (hospital order, performed in [facility] hospital lab) Nasopharyngeal Nasopharyngeal Swab     Status: None   Collection Time: 11/21/19 12:32 PM   Specimen: Nasopharyngeal Swab  Result Value Ref Range Status   SARS Coronavirus 2 NEGATIVE NEGATIVE Final    Comment: (NOTE) SARS-CoV-2 target nucleic acids are NOT DETECTED. The SARS-CoV-2 RNA is generally detectable in upper and lower respiratory specimens during the acute phase of infection. The lowest concentration of SARS-CoV-2 viral copies this  assay can detect is 250 copies / mL. A negative result does not preclude SARS-CoV-2 infection and should not be used as the sole basis for treatment or other patient management decisions.  A negative result may occur with improper specimen collection / handling,  submission of specimen other than nasopharyngeal swab, presence of viral mutation(s) within the areas targeted by this assay, and inadequate number of viral copies (<250 copies / mL). A negative result must be combined with clinical observations, patient history, and epidemiological information. Fact Sheet for Patients:   StrictlyIdeas.no Fact Sheet for Healthcare Providers: BankingDealers.co.za This test is not yet approved or cleared  by the [place] FDA and has been authorized for detection and/or diagnosis of SARS-CoV-2 by FDA under an Emergency Use Authorization (EUA).  This EUA will remain in effect (meaning this test can be used) for the duration of the COVID-19 declaration under Section 564(b)(1) of the Act, 21 U.S.C. section 360bbb-3(b)(1), unless the authorization is terminated or revoked sooner. Performed at [facility], [place] [address], [place], [place] [address]   Blood culture (routine x 2)     Status: None   Collection Time: 11/21/19  5:49 PM   Specimen: BLOOD  Result Value Ref Range Status   Specimen Description BLOOD SITE NOT SPECIFIED  Final   Special Requests   Final    BOTTLES DRAWN AEROBIC AND ANAEROBIC Blood Culture results may not be optimal due to an inadequate volume of blood received in culture bottles   Culture   Final    NO GROWTH 5 DAYS Performed at [facility], [place] [address] [place], [place] [address]    Report Status 11/26/2019 FINAL  Final      Radiology Studies: No results found.     LOS: 6 days   Tailer [employer] on [email or web address]  11/27/2019, 9:36 AM

## 2019-11-27 NOTE — Procedures (Addendum)
Patient declined CPAP for tonight.  Instructed patient to have RN call if she changes her mind.

## 2019-11-27 NOTE — Progress Notes (Signed)
Subjective: Patient reports ready for surgery  Objective: Vital signs in last 24 hours: Temp:  [97.5 F (36.4 C)-98.9 F (37.2 C)] 98.4 F (36.9 C) (05/31 1156) Pulse Rate:  [72-82] 82 (05/31 1156) Resp:  [16-18] 18 (05/31 1156) BP: (102-154)/(64-89) 102/78 (05/31 1156) SpO2:  [91 %-94 %] 91 % (05/31 1156)  Intake/Output from previous day: 05/30 0701 - 05/31 0700 In: 150 [P.O.:150] Out: 2425 [Urine:2425] Intake/Output this shift: Total I/O In: 150 [P.O.:150] Out: 600 [Urine:600]  Physical Exam: Awake, alert, conversant.  No complaints.  Lab Results: Recent Labs    11/26/19 0453 11/27/19 0453  WBC 4.9 5.4  HGB 15.0 15.6*  HCT 50.1* 51.4*  PLT 208 204   BMET Recent Labs    11/26/19 0453 11/27/19 0453  NA 140 139  K 4.3 4.5  CL 103 101  CO2 30 32  GLUCOSE 99 102*  BUN 18 20  CREATININE 0.83 0.95  CALCIUM 9.0 9.1    Studies/Results: No results found.  Assessment/Plan: Heparin to be discontinued at midnight.  Plan OR in AM with Dr. Zada Finders.  I have made patient NPO, ordered heparin to stop at midnight, and ordered a surgical permit.    LOS: 6 days    Peggyann Shoals, MD 11/27/2019, 12:12 PM

## 2019-11-28 ENCOUNTER — Inpatient Hospital Stay (HOSPITAL_COMMUNITY): Payer: Medicare Other | Admitting: Certified Registered"

## 2019-11-28 ENCOUNTER — Encounter (HOSPITAL_COMMUNITY): Payer: Self-pay | Admitting: Internal Medicine

## 2019-11-28 ENCOUNTER — Encounter (HOSPITAL_COMMUNITY)
Admission: EM | Disposition: A | Discharge status: Skilled Nursing Facility | Payer: Self-pay | Source: Home / Self Care | Attending: Neurological Surgery

## 2019-11-28 ENCOUNTER — Other Ambulatory Visit: Payer: Self-pay

## 2019-11-28 DIAGNOSIS — D496 Neoplasm of unspecified behavior of brain: Secondary | ICD-10-CM | POA: Diagnosis present

## 2019-11-28 HISTORY — PX: CRANIOTOMY: SHX93

## 2019-11-28 HISTORY — PX: APPLICATION OF CRANIAL NAVIGATION: SHX6578

## 2019-11-28 LAB — CBC
HCT: 52.6 % — ABNORMAL HIGH (ref 36.0–46.0)
Hemoglobin: 16 g/dL — ABNORMAL HIGH (ref 12.0–15.0)
MCH: 27.9 pg (ref 26.0–34.0)
MCHC: 30.4 g/dL (ref 30.0–36.0)
MCV: 91.8 fL (ref 80.0–100.0)
Platelets: 212 10*3/uL (ref 150–400)
RBC: 5.73 MIL/uL — ABNORMAL HIGH (ref 3.87–5.11)
RDW: 16.1 % — ABNORMAL HIGH (ref 11.5–15.5)
WBC: 6 10*3/uL (ref 4.0–10.5)
nRBC: 0 % (ref 0.0–0.2)

## 2019-11-28 LAB — BASIC METABOLIC PANEL
Anion gap: 9 (ref 5–15)
BUN: 21 mg/dL (ref 8–23)
CO2: 30 mmol/L (ref 22–32)
Calcium: 9.3 mg/dL (ref 8.9–10.3)
Chloride: 98 mmol/L (ref 98–111)
Creatinine, Ser: 0.91 mg/dL (ref 0.44–1.00)
GFR calc Af Amer: >60 mL/min (ref >60)
GFR calc non Af Amer: >60 mL/min (ref >60)
Glucose, Bld: 106 mg/dL — ABNORMAL HIGH (ref 70–99)
Potassium: 4.8 mmol/L (ref 3.5–5.1)
Sodium: 137 mmol/L (ref 135–145)

## 2019-11-28 LAB — SURGICAL PCR SCREEN
MRSA, PCR: NEGATIVE
Staphylococcus aureus: POSITIVE — AB

## 2019-11-28 LAB — TYPE AND SCREEN
ABO/RH(D): O POS
Antibody Screen: NEGATIVE

## 2019-11-28 LAB — HEPARIN LEVEL (UNFRACTIONATED): Heparin Unfractionated: 0.38 IU/mL (ref 0.30–0.70)

## 2019-11-28 LAB — APTT: aPTT: 71 seconds — ABNORMAL HIGH (ref 24–36)

## 2019-11-28 LAB — ABO/RH: ABO/RH(D): O POS

## 2019-11-28 SURGERY — CRANIOTOMY TUMOR EXCISION
Anesthesia: General | Site: Head | Laterality: Right

## 2019-11-28 MED ORDER — THROMBIN 5000 UNITS EX SOLR
CUTANEOUS | Status: AC
  Filled 2019-11-28: qty 5000

## 2019-11-28 MED ORDER — CEFAZOLIN SODIUM-DEXTROSE 2-4 GM/100ML-% IV SOLN
2.0000 g | Freq: Three times a day (TID) | INTRAVENOUS | Status: AC
  Administered 2019-11-29 (×2): 2 g via INTRAVENOUS
  Filled 2019-11-28 (×2): qty 100

## 2019-11-28 MED ORDER — POLYETHYLENE GLYCOL 3350 17 G PO PACK
17.0000 g | PACK | Freq: Every day | ORAL | Status: DC | PRN
  Administered 2019-11-30: 17 g via ORAL

## 2019-11-28 MED ORDER — PHENYLEPHRINE HCL-NACL 10-0.9 MG/250ML-% IV SOLN
INTRAVENOUS | Status: DC | PRN
  Administered 2019-11-28: 25 ug/min via INTRAVENOUS

## 2019-11-28 MED ORDER — 0.9 % SODIUM CHLORIDE (POUR BTL) OPTIME
TOPICAL | Status: DC | PRN
  Administered 2019-11-28 (×3): 1000 mL

## 2019-11-28 MED ORDER — ALBUMIN HUMAN 5 % IV SOLN
INTRAVENOUS | Status: DC | PRN
Start: 2019-11-28 — End: 2019-11-28

## 2019-11-28 MED ORDER — ONDANSETRON HCL 4 MG PO TABS: 4.0000 mg | ORAL_TABLET | ORAL | Status: DC | PRN

## 2019-11-28 MED ORDER — FENTANYL CITRATE (PF) 250 MCG/5ML IJ SOLN
INTRAMUSCULAR | Status: AC
  Filled 2019-11-28: qty 5

## 2019-11-28 MED ORDER — THROMBIN 5000 UNITS EX SOLR: OROMUCOSAL | Status: DC | PRN

## 2019-11-28 MED ORDER — FENTANYL CITRATE (PF) 100 MCG/2ML IJ SOLN: 25.0000 ug | Freq: Once | INTRAMUSCULAR | Status: AC

## 2019-11-28 MED ORDER — HEPARIN SODIUM (PORCINE) 5000 UNIT/ML IJ SOLN
5000.0000 [IU] | Freq: Three times a day (TID) | INTRAMUSCULAR | Status: DC
  Administered 2019-11-30 – 2019-12-03 (×11): 5000 [IU] via SUBCUTANEOUS
  Filled 2019-11-28 (×11): qty 1

## 2019-11-28 MED ORDER — FENTANYL CITRATE (PF) 100 MCG/2ML IJ SOLN
INTRAMUSCULAR | Status: AC
  Administered 2019-11-28: 25 ug via INTRAVENOUS
  Filled 2019-11-28: qty 2

## 2019-11-28 MED ORDER — ONDANSETRON HCL 4 MG/2ML IJ SOLN
4.0000 mg | INTRAMUSCULAR | Status: DC | PRN
  Administered 2019-11-28 – 2019-12-05 (×2): 4 mg via INTRAVENOUS
  Filled 2019-11-28 (×2): qty 2

## 2019-11-28 MED ORDER — HYDROMORPHONE HCL 1 MG/ML IJ SOLN
0.5000 mg | INTRAMUSCULAR | Status: DC | PRN
  Administered 2019-11-28 – 2019-12-07 (×11): 0.5 mg via INTRAVENOUS
  Filled 2019-11-28 (×2): qty 0.5
  Filled 2019-11-28: qty 1
  Filled 2019-11-28 (×7): qty 0.5
  Filled 2019-11-28: qty 1

## 2019-11-28 MED ORDER — SUGAMMADEX SODIUM 200 MG/2ML IV SOLN
INTRAVENOUS | Status: DC | PRN
  Administered 2019-11-28: 200 mg via INTRAVENOUS

## 2019-11-28 MED ORDER — ROCURONIUM BROMIDE 10 MG/ML (PF) SYRINGE
PREFILLED_SYRINGE | INTRAVENOUS | Status: DC | PRN
  Administered 2019-11-28: 30 mg via INTRAVENOUS
  Administered 2019-11-28 (×2): 50 mg via INTRAVENOUS

## 2019-11-28 MED ORDER — LIDOCAINE-EPINEPHRINE 1 %-1:100000 IJ SOLN
INTRAMUSCULAR | Status: AC
  Filled 2019-11-28: qty 1

## 2019-11-28 MED ORDER — DOCUSATE SODIUM 100 MG PO CAPS
100.0000 mg | ORAL_CAPSULE | Freq: Two times a day (BID) | ORAL | Status: DC
  Administered 2019-11-28 – 2019-12-08 (×19): 100 mg via ORAL
  Filled 2019-11-28 (×20): qty 1

## 2019-11-28 MED ORDER — LABETALOL HCL 5 MG/ML IV SOLN: 10.0000 mg | INTRAVENOUS | Status: DC | PRN

## 2019-11-28 MED ORDER — CHLORHEXIDINE GLUCONATE 0.12 % MT SOLN
OROMUCOSAL | Status: AC
  Filled 2019-11-28: qty 15

## 2019-11-28 MED ORDER — SODIUM CHLORIDE 0.9 % IV SOLN
INTRAVENOUS | Status: DC | PRN
Start: 2019-11-28 — End: 2019-11-28

## 2019-11-28 MED ORDER — THROMBIN 20000 UNITS EX SOLR
CUTANEOUS | Status: AC
  Filled 2019-11-28: qty 20000

## 2019-11-28 MED ORDER — HYDROCODONE-ACETAMINOPHEN 5-325 MG PO TABS
1.0000 | ORAL_TABLET | ORAL | Status: DC | PRN
  Administered 2019-11-29 – 2019-12-08 (×20): 1 via ORAL
  Filled 2019-11-28 (×20): qty 1

## 2019-11-28 MED ORDER — BACITRACIN ZINC 500 UNIT/GM EX OINT
TOPICAL_OINTMENT | CUTANEOUS | Status: AC
  Filled 2019-11-28: qty 28.35

## 2019-11-28 MED ORDER — BACITRACIN ZINC 500 UNIT/GM EX OINT
TOPICAL_OINTMENT | CUTANEOUS | Status: DC | PRN
  Administered 2019-11-28: 1 via TOPICAL

## 2019-11-28 MED ORDER — MIDAZOLAM HCL 2 MG/2ML IJ SOLN
INTRAMUSCULAR | Status: AC
  Filled 2019-11-28: qty 2

## 2019-11-28 MED ORDER — HEMOSTATIC AGENTS (NO CHARGE) OPTIME
TOPICAL | Status: DC | PRN
  Administered 2019-11-28: 1 via TOPICAL

## 2019-11-28 MED ORDER — LIDOCAINE-EPINEPHRINE 1 %-1:100000 IJ SOLN
INTRAMUSCULAR | Status: DC | PRN
  Administered 2019-11-28: 10 mL

## 2019-11-28 MED ORDER — SODIUM CHLORIDE 0.9 % IV SOLN: INTRAVENOUS | Status: DC | PRN

## 2019-11-28 MED ORDER — CHLORHEXIDINE GLUCONATE CLOTH 2 % EX PADS
6.0000 | MEDICATED_PAD | Freq: Every day | CUTANEOUS | Status: DC
  Administered 2019-11-28 – 2019-12-08 (×9): 6 via TOPICAL

## 2019-11-28 MED ORDER — ESMOLOL HCL 100 MG/10ML IV SOLN
INTRAVENOUS | Status: DC | PRN
Start: 2019-11-28 — End: 2019-11-28
  Administered 2019-11-28: 20 mg via INTRAVENOUS

## 2019-11-28 MED ORDER — PROMETHAZINE HCL 25 MG PO TABS: 12.5000 mg | ORAL_TABLET | ORAL | Status: DC | PRN

## 2019-11-28 MED ORDER — EPHEDRINE SULFATE-NACL 50-0.9 MG/10ML-% IV SOSY
PREFILLED_SYRINGE | INTRAVENOUS | Status: DC | PRN
  Administered 2019-11-28: 10 mg via INTRAVENOUS

## 2019-11-28 MED ORDER — THROMBIN 20000 UNITS EX SOLR: CUTANEOUS | Status: DC | PRN

## 2019-11-28 MED ORDER — CEFAZOLIN SODIUM-DEXTROSE 2-4 GM/100ML-% IV SOLN
2.0000 g | Freq: Once | INTRAVENOUS | Status: AC
  Administered 2019-11-28: 2 mg via INTRAVENOUS
  Filled 2019-11-28: qty 100

## 2019-11-28 MED ORDER — FENTANYL CITRATE (PF) 100 MCG/2ML IJ SOLN
INTRAMUSCULAR | Status: DC | PRN
  Administered 2019-11-28 (×2): 75 ug via INTRAVENOUS

## 2019-11-28 MED ORDER — PROPOFOL 10 MG/ML IV BOLUS
INTRAVENOUS | Status: AC
  Filled 2019-11-28: qty 20

## 2019-11-28 MED ORDER — PROPOFOL 10 MG/ML IV BOLUS
INTRAVENOUS | Status: DC | PRN
  Administered 2019-11-28: 40 mg via INTRAVENOUS

## 2019-11-28 SURGICAL SUPPLY — 98 items
APL SKNCLS STERI-STRIP NONHPOA (GAUZE/BANDAGES/DRESSINGS)
BAG DECANTER FOR FLEXI CONT (MISCELLANEOUS) ×1 IMPLANT
BAND INSRT 18 STRL LF DISP RB (MISCELLANEOUS)
BAND RUBBER #18 3X1/16 STRL (MISCELLANEOUS) IMPLANT
BENZOIN TINCTURE PRP APPL 2/3 (GAUZE/BANDAGES/DRESSINGS) IMPLANT
BLADE CLIPPER SURG (BLADE) ×2 IMPLANT
BLADE SAW GIGLI 16 STRL (MISCELLANEOUS) IMPLANT
BLADE SURG 15 STRL LF DISP TIS (BLADE) IMPLANT
BLADE SURG 15 STRL SS (BLADE)
BNDG CMPR 75X41 PLY HI ABS (GAUZE/BANDAGES/DRESSINGS)
BNDG GAUZE ELAST 4 BULKY (GAUZE/BANDAGES/DRESSINGS) IMPLANT
BNDG STRETCH 4X75 STRL LF (GAUZE/BANDAGES/DRESSINGS) IMPLANT
BUR ACORN 9.0 PRECISION (BURR) ×2 IMPLANT
BUR ROUND FLUTED 4 SOFT TCH (BURR) IMPLANT
BUR SPIRAL ROUTER 2.3 (BUR) ×2 IMPLANT
CANISTER SUCT 3000ML PPV (MISCELLANEOUS) ×4 IMPLANT
CATH VENTRIC 35X38 W/TROCAR LG (CATHETERS) IMPLANT
CLIP VESOCCLUDE MED 6/CT (CLIP) IMPLANT
CNTNR URN SCR LID CUP LEK RST (MISCELLANEOUS) ×1 IMPLANT
CONT SPEC 4OZ STRL OR WHT (MISCELLANEOUS) ×2
COVER BURR HOLE 7 (Orthopedic Implant) ×2 IMPLANT
COVER MAYO STAND STRL (DRAPES) IMPLANT
COVER WAND RF STERILE (DRAPES) ×1 IMPLANT
DECANTER SPIKE VIAL GLASS SM (MISCELLANEOUS) ×2 IMPLANT
DRAIN SUBARACHNOID (WOUND CARE) IMPLANT
DRAPE HALF SHEET 40X57 (DRAPES) ×2 IMPLANT
DRAPE MICROSCOPE LEICA (MISCELLANEOUS) IMPLANT
DRAPE NEUROLOGICAL W/INCISE (DRAPES) ×2 IMPLANT
DRAPE STERI IOBAN 125X83 (DRAPES) IMPLANT
DRAPE SURG 17X23 STRL (DRAPES) IMPLANT
DRAPE WARM FLUID 44X44 (DRAPES) ×2 IMPLANT
DRSG ADAPTIC 3X8 NADH LF (GAUZE/BANDAGES/DRESSINGS) IMPLANT
DRSG TELFA 3X8 NADH (GAUZE/BANDAGES/DRESSINGS) IMPLANT
DURAPREP 6ML APPLICATOR 50/CS (WOUND CARE) ×2 IMPLANT
ELECT REM PT RETURN 9FT ADLT (ELECTROSURGICAL) ×2
ELECTRODE REM PT RTRN 9FT ADLT (ELECTROSURGICAL) ×1 IMPLANT
EVACUATOR 1/8 PVC DRAIN (DRAIN) IMPLANT
EVACUATOR SILICONE 100CC (DRAIN) IMPLANT
FORCEPS BIPOLAR SPETZLER 8 1.0 (NEUROSURGERY SUPPLIES) ×2 IMPLANT
GAUZE 4X4 16PLY RFD (DISPOSABLE) IMPLANT
GAUZE SPONGE 4X4 12PLY STRL (GAUZE/BANDAGES/DRESSINGS) IMPLANT
GLOVE BIO SURGEON STRL SZ7 (GLOVE) ×1 IMPLANT
GLOVE BIO SURGEON STRL SZ7.5 (GLOVE) ×2 IMPLANT
GLOVE BIOGEL PI IND STRL 7.0 (GLOVE) IMPLANT
GLOVE BIOGEL PI IND STRL 7.5 (GLOVE) ×1 IMPLANT
GLOVE BIOGEL PI INDICATOR 7.0 (GLOVE)
GLOVE BIOGEL PI INDICATOR 7.5 (GLOVE) ×3
GLOVE EXAM NITRILE LRG STRL (GLOVE) IMPLANT
GLOVE EXAM NITRILE XL STR (GLOVE) IMPLANT
GLOVE EXAM NITRILE XS STR PU (GLOVE) IMPLANT
GLOVE INDICATOR 7.5 STRL GRN (GLOVE) ×1 IMPLANT
GLOVE SURG SS PI 7.0 STRL IVOR (GLOVE) ×3 IMPLANT
GOWN STRL REUS W/ TWL LRG LVL3 (GOWN DISPOSABLE) ×2 IMPLANT
GOWN STRL REUS W/ TWL XL LVL3 (GOWN DISPOSABLE) IMPLANT
GOWN STRL REUS W/TWL 2XL LVL3 (GOWN DISPOSABLE) IMPLANT
GOWN STRL REUS W/TWL LRG LVL3 (GOWN DISPOSABLE) ×6
GOWN STRL REUS W/TWL XL LVL3 (GOWN DISPOSABLE) ×4
GRAFT DURAGEN MATRIX 2WX2L ×1 IMPLANT
HEMOSTAT POWDER KIT SURGIFOAM (HEMOSTASIS) ×2 IMPLANT
HEMOSTAT SURGICEL 2X14 (HEMOSTASIS) ×2 IMPLANT
HOOK DURA 1/2IN (MISCELLANEOUS) ×1 IMPLANT
IV NS 1000ML (IV SOLUTION)
IV NS 1000ML BAXH (IV SOLUTION) ×1 IMPLANT
KIT BASIN OR (CUSTOM PROCEDURE TRAY) ×2 IMPLANT
KIT DRAIN CSF ACCUDRAIN (MISCELLANEOUS) IMPLANT
KIT TURNOVER KIT B (KITS) ×2 IMPLANT
MARKER SPHERE PSV REFLC 13MM (MARKER) ×5 IMPLANT
NDL SPNL 18GX3.5 QUINCKE PK (NEEDLE) IMPLANT
NEEDLE HYPO 22GX1.5 SAFETY (NEEDLE) ×2 IMPLANT
NEEDLE SPNL 18GX3.5 QUINCKE PK (NEEDLE) IMPLANT
NS IRRIG 1000ML POUR BTL (IV SOLUTION) ×6 IMPLANT
PACK BATTERY CMF DISP FOR DVR (ORTHOPEDIC DISPOSABLE SUPPLIES) ×1 IMPLANT
PACK CRANIOTOMY CUSTOM (CUSTOM PROCEDURE TRAY) ×2 IMPLANT
PAD DRESSING TELFA 3X8 NADH (GAUZE/BANDAGES/DRESSINGS) IMPLANT
PATTIES SURGICAL .25X.25 (GAUZE/BANDAGES/DRESSINGS) IMPLANT
PATTIES SURGICAL .5 X.5 (GAUZE/BANDAGES/DRESSINGS) IMPLANT
PATTIES SURGICAL .5 X3 (DISPOSABLE) IMPLANT
PATTIES SURGICAL 1/4 X 3 (GAUZE/BANDAGES/DRESSINGS) IMPLANT
PATTIES SURGICAL 1X1 (DISPOSABLE) IMPLANT
PIN MAYFIELD SKULL DISP (PIN) ×2 IMPLANT
PLATE DOUBLE Y CMF 6H (Plate) ×2 IMPLANT
SCREW UNIII AXS SD 1.5X4 (Screw) ×14 IMPLANT
SPECIMEN JAR SMALL (MISCELLANEOUS) IMPLANT
SPONGE NEURO XRAY DETECT 1X3 (DISPOSABLE) IMPLANT
SPONGE SURGIFOAM ABS GEL 100 (HEMOSTASIS) ×2 IMPLANT
STAPLER VISISTAT 35W (STAPLE) ×2 IMPLANT
SUT ETHILON 3 0 FSL (SUTURE) IMPLANT
SUT ETHILON 3 0 PS 1 (SUTURE) IMPLANT
SUT MNCRL AB 3-0 PS2 18 (SUTURE) ×1 IMPLANT
SUT NURALON 4 0 TR CR/8 (SUTURE) ×5 IMPLANT
SUT SILK 0 TIES 10X30 (SUTURE) IMPLANT
SUT VIC AB 2-0 CP2 18 (SUTURE) ×3 IMPLANT
TOWEL GREEN STERILE (TOWEL DISPOSABLE) ×2 IMPLANT
TOWEL GREEN STERILE FF (TOWEL DISPOSABLE) ×2 IMPLANT
TRAY FOLEY MTR SLVR 16FR STAT (SET/KITS/TRAYS/PACK) ×2 IMPLANT
TUBE CONNECTING 12X1/4 (SUCTIONS) ×1 IMPLANT
UNDERPAD 30X36 HEAVY ABSORB (UNDERPADS AND DIAPERS) ×1 IMPLANT
WATER STERILE IRR 1000ML POUR (IV SOLUTION) ×2 IMPLANT

## 2019-11-28 NOTE — Brief Op Note (Signed)
11/28/2019  8:19 PM  PATIENT:  [name]  74 y.o. female  PRE-OPERATIVE DIAGNOSIS:  BRAIN TUMOR  POST-OPERATIVE DIAGNOSIS:  BRAIN TUMOR  PROCEDURE:  Procedure(s): Right Craniotomy for Tumor Resection (Right) APPLICATION OF CRANIAL NAVIGATION (Right)  SURGEON:  Surgeon(s) and Role:    * [name], MD - Primary  PHYSICIAN ASSISTANT:   ASSISTANTS: none   ANESTHESIA:   general  EBL:  350cc  BLOOD ADMINISTERED:none  DRAINS: none   LOCAL MEDICATIONS USED:  LIDOCAINE   SPECIMEN:  Source of Specimen:  Right convexity brain tumor  DISPOSITION OF SPECIMEN:  PATHOLOGY  COUNTS:  YES  TOURNIQUET:  * No tourniquets in log *  DICTATION: .Note written in EPIC  PLAN OF CARE: Admit to inpatient   PATIENT DISPOSITION:  PACU - hemodynamically stable.   Delay start of Pharmacological VTE agent (>24hrs) due to surgical blood loss or risk of bleeding: yes

## 2019-11-28 NOTE — Progress Notes (Signed)
Neurosurgery Service Post-operative progress note  Assessment & Plan: 74 y.o. woman s/p craniotomy for tumor resection, seen in PACU, FCx4 but 1/5 in LLE, 4/5 in LUE, 5/5 on R.  -admit to 4N ICU -MRI brain w/wo in AM -path pending, but tumor appeared infiltrative, suspect metastatic but could also be high grade meningioma, will f/u on final path -SQH on POD2, will determine heparin gtt restart timing after imaging completed tomorrow  [name]  11/28/19 8:27 PM

## 2019-11-28 NOTE — Progress Notes (Signed)
Patient came to 4N21 from PACU with no belongings.  Called 3W and RN went and got patient's belongings from previous unit.  Upon assessment patient's belongings included,m a cell phone, a pair of hoop earrings, a pair of pants, one bracelet, and a shirt.

## 2019-11-28 NOTE — Anesthesia Procedure Notes (Addendum)
Central Venous Catheter Insertion Performed by: Barnet Glasgow, MD, anesthesiologist Start/End6/06/2019 5:04 PM, 11/28/2019 5:28 PM Patient location: Pre-op. Preanesthetic checklist: patient identified, IV checked, site marked, risks and benefits discussed, surgical consent, monitors and equipment checked, pre-op evaluation, timeout performed and anesthesia consent Position: Trendelenburg Lidocaine 1% used for infiltration and patient sedated Hand hygiene performed  and maximum sterile barriers used  Catheter size: 8 Fr Total catheter length 16. Central line was placed.Double lumen Procedure performed using ultrasound guided technique. Ultrasound Notes:anatomy identified and image(s) printed for medical record Attempts: 1 Following insertion, dressing applied and line sutured.   Post procedure assessment: blood return through all ports, free fluid flow and no air  Patient tolerated the procedure well with no immediate complications.

## 2019-11-28 NOTE — Progress Notes (Signed)
[facility] PROGRESS NOTE   [name]:[id number] DOB: 1945-07-29 DOA: 11/21/2019  PCP: Michelle Ebbs, MD  Brief History/Interval Summary: [name] is a 74 y.o. 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 Per ems patient was found face down, but noted to able to give some history of her fall and noted complaints of pain on her left hip and back and denied any LOC at that time. Per EMS patient was found to be unkempt and her room was note to be squalid condition. At the scene her blood pressure as 80/50, sat was 87% she was off her home O2.She was treated with 500 ns en route.  ED Course:  In the ED patient was noted to be lethargic and was unable to give history initial and worsen ensured related to change in mental status.    Evaluation was positive for a brain lesion.  Patient was hospitalized for further management.   Consultants:  Neurosurgery.  Phone conversation with neurology  Procedures: EEG  Antibiotics: Anti-infectives (From admission, onward)   Start     Dose/Rate Route Frequency Ordered Stop   11/21/19 2000  bictegravir-emtricitabine-tenofovir AF (BIKTARVY) 50-200-25 MG per tablet 1 tablet     1 tablet Oral Daily 11/21/19 1953        Subjective/Interval History: Patient slightly somnolent this morning but easily arousable.  Reports an uneventful night.  It appears that she did not wear her CPAP overnight.  Denies any complaints this morning.  Looking  forward to the surgery.  Slightly nervous.    ROS: Denies any shortness of breath    Assessment/Plan:  Enlarging Brain lesion with vasogenic edema and Mass effect  There was concern for seizure activity.  Based on H&P this issue was discussed with a neurologist who did not recommend steroids or antiepileptic drugs at this time.  EEG did not show any epileptiform activity. Neurosurgery is following.  They recommended staging work-up due to patient's previous history of lung cancer.  Patient previously seen by Dr. Julien Day.  CT scan of the chest abdomen pelvis does not show any evidence for either primary tumor or metastatic process.   Neurosurgery is planning an operative resection. Her anticoagulation has been discontinued.  She was started on IV heparin bridge per pharmacy.  Discontinued at midnight by neurosurgery.  To be reinitiated post operatively based on neurosurgical input. Patient remained stable from a neurological standpoint.  Surgery sometime today.  Acute Encephalopathy, other Seems to be back to her baseline.  Could be secondary to fall, dehydration, brain lesion.  Acute hypoxic/hypercarbic respiratory failure on chronic hypoxic respiratory failure  Possibly related to noncompliance with home oxygen.  She is on home oxygen at 3 to 4 L/min.  Remained stable on supplemental oxygen.    Hx of Lung CA  This involved her left upper lobe.  Appears that she underwent ?SRS and radiation treatment.  No evidence for recurrence on CT scan.  Mild AKI on CKDIII Resolved with IV hydration.  Hypokalemia Both potassium and magnesium were repleted.  HIV  Continue antiretrovirals.  Fall ,Mechanical No injuries noted on work-up.-no fx on noted on work up.  History of COPD /Asthma Seems to be stable.  Continue home medication regimen.  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  This will  need to be discontinued for her neurosurgery as discussed above. Discussed with Dr. Leonie Day with stroke service.  He recommends stopping apixaban and bridging with IV heparin about 3 to 4 days prior to surgery.  She was being bridged with IV heparin, held at midnight. Anticoagulation to be reinitiated after surgery based on neurosurgery input.  Hx of CVA, nonhemorrhagic 9/20 Sequela of left sided weakness.  Patient on apixaban at home.  See above.   History of essential hypertension Patient's blood pressure were elevated initially.  Now she is noted to be normotensive.  Continue to monitor.  Leave her off of medications for now.    OSA  CPAP qhs   Chronic Tobacco abuse  Patient continue to smoke daily. Nicotine patch /encourage cessation   Constipation Resolved with laxatives.   DVT Prophylaxis: Apixaban at home.  Was on IV heparin as a bridge.  See above Code Status: Full code Family Communication: Discussed with the patient.  No family at bedside. Disposition Plan:  Status is: Inpatient  Remains inpatient appropriate because:Ongoing diagnostic testing needed not appropriate for outpatient work up.  She will need to undergo resection of her brain lesion.   Dispo: The patient is from: Home              Anticipated d/c is to: To be determined              Anticipated d/c date is: Next week after surgery              Patient currently is not medically stable to d/c.    Medications:  Scheduled: . bictegravir-emtricitabine-tenofovir AF  1 tablet Oral Daily  . fluticasone furoate-vilanterol  1 puff Inhalation Daily  . gabapentin  800 mg Oral TID  . loratadine  10 mg Oral BID  . montelukast  10 mg Oral QHS  . polyethylene glycol  17 g Oral BID  . QUEtiapine Fumarate  150 mg Oral QHS  . rOPINIRole  1 mg Oral QHS  . senna  2 tablet Oral QHS  . sodium chloride flush  3 mL Intravenous Once  . umeclidinium bromide  1 puff Inhalation Daily  . venlafaxine XR  150 mg Oral Daily  .  vitamin C  125 mg Oral Daily   Continuous:  CXK:GYJEHUDJSHFWY **OR** acetaminophen, albuterol, diclofenac Sodium, HYDROcodone-acetaminophen, LORazepam, ondansetron **OR** ondansetron (ZOFRAN) IV, sodium phosphate   Objective:  Vital Signs  Vitals:   11/28/19 0023 11/28/19 0442 11/28/19 0829 11/28/19 0848  BP: (!) 152/79 122/76  114/78  Pulse: 72 76 85 81  Resp: 16 18 16 18   Temp: 98.6 F (37 C) 98.6 F (37 C)  97.6 F (36.4 C)  TempSrc: Oral Oral  Oral  SpO2: 92% 92% 90% 92%  Weight:      Height:        Intake/Output Summary (Last 24 hours) at 11/28/2019 0854 Last data filed at 11/28/2019 0530 Gross per 24 hour  Intake 500 ml  Output 2200 ml  Net -1700 ml   Filed Weights   11/22/19 1900 11/24/19 0500  Weight: 73.5 kg 87 kg    General appearance: Somnolent but easily arousable.  In no distress. Resp: Clear to auscultation bilaterally.  Normal effort Cardio: S1-S2 is normal regular.  No S3-S4.  No rubs murmurs or bruit GI: Abdomen is soft.  Nontender nondistended.  Bowel sounds are present normal.  No masses organomegaly Extremities: No edema.  Full range of motion of lower extremities. Neurologic: Alert and oriented x3.  No focal neurological deficits.      Lab Results:  Data Reviewed: I have personally reviewed following labs and imaging studies  CBC: Recent Labs  Lab 11/24/19 0444 11/25/19 0732 11/26/19 0453 11/27/19 0453 11/28/19 0400  WBC 4.2 5.0 4.9 5.4 6.0  HGB 16.4* 15.3* 15.0 15.6* 16.0*  HCT 54.0* 51.6* 50.1* 51.4* 52.6*  MCV 93.8 93.3 93.1 91.9 91.8  PLT PLATELET CLUMPS NOTED ON SMEAR, UNABLE TO ESTIMATE 201 208 204 242    Basic Metabolic Panel: Recent Labs  Lab 11/22/19 0856 11/22/19 1819 11/23/19 0331 11/23/19 0331 11/24/19 0444 11/25/19 0732 11/26/19 0453 11/27/19 0453 11/28/19 0400  NA  --    < > 140   < > 140 142 140 139 137  K  --    < > 4.5   < > 4.5 3.8 4.3 4.5 4.8  CL  --    < > 101   < > 102 103 103 101 98  CO2  --    <  > 27   < > 27 31 30  32 30  GLUCOSE  --    < > 96   < > 91 109* 99 102* 106*  BUN  --    < > 11   < > 19 18 18 20 21   CREATININE  --    < > 0.81   < > 0.85 0.83 0.83 0.95 0.91  CALCIUM  --    < > 9.1   < > 9.3 9.1 9.0 9.1 9.3  MG 1.3*  --  1.9  --   --   --   --   --   --    < > = values in this interval not displayed.    GFR: Estimated Creatinine Clearance: 65.7 mL/min (by C-G formula based on SCr of 0.91 mg/dL).  Liver Function Tests: Recent Labs  Lab 11/22/19 0220 11/22/19 0809  AST 18 20  ALT 13 13  ALKPHOS 34* 42  BILITOT 0.4 0.1*  PROT 4.7* 5.0*  ALBUMIN 2.1* 2.1*      Recent Results (from the past 240 hour(s))  Blood culture (routine x 2)     Status: None   Collection Time: 11/21/19 10:30 AM   Specimen: BLOOD LEFT ARM  Result Value Ref Range Status   Specimen Description BLOOD LEFT ARM  Final   Special Requests   Final    BOTTLES DRAWN AEROBIC AND ANAEROBIC Blood Culture results may not be optimal due to an inadequate volume of blood received in culture bottles   Culture   Final    NO GROWTH 5 DAYS Performed at [facility], [place] [address], [place], [place] [address]    Report Status 11/26/2019 FINAL  Final  SARS Coronavirus 2 by RT PCR (hospital order, performed in [facility] hospital lab) Nasopharyngeal Nasopharyngeal Swab     Status: None   Collection Time: 11/21/19 12:32 PM   Specimen: Nasopharyngeal Swab  Result Value Ref Range Status   SARS Coronavirus 2 NEGATIVE NEGATIVE Final    Comment: (NOTE) SARS-CoV-2 target nucleic acids are NOT DETECTED. The SARS-CoV-2 RNA is generally detectable in  upper and lower respiratory specimens during the acute phase of infection. The lowest concentration of SARS-CoV-2 viral copies this assay can detect is 250 copies / mL. A negative result does not preclude SARS-CoV-2 infection and should not be used as the sole basis for treatment or other patient management decisions.  A negative result may occur  with improper specimen collection / handling, submission of specimen other than nasopharyngeal swab, presence of viral mutation(s) within the areas targeted by this assay, and inadequate number of viral copies (<250 copies / mL). A negative result must be combined with clinical observations, patient history, and epidemiological information. Fact Sheet for Patients:   StrictlyIdeas.no Fact Sheet for Healthcare Providers: BankingDealers.co.za This test is not yet approved or cleared  by the [place] FDA and has been authorized for detection and/or diagnosis of SARS-CoV-2 by FDA under an Emergency Use Authorization (EUA).  This EUA will remain in effect (meaning this test can be used) for the duration of the COVID-19 declaration under Section 564(b)(1) of the Act, 21 U.S.C. section 360bbb-3(b)(1), unless the authorization is terminated or revoked sooner. Performed at [facility], [place] [address], [place], [place] [address]   Blood culture (routine x 2)     Status: None   Collection Time: 11/21/19  5:49 PM   Specimen: BLOOD  Result Value Ref Range Status   Specimen Description BLOOD SITE NOT SPECIFIED  Final   Special Requests   Final    BOTTLES DRAWN AEROBIC AND ANAEROBIC Blood Culture results may not be optimal due to an inadequate volume of blood received in culture bottles   Culture   Final    NO GROWTH 5 DAYS Performed at [facility], [place] [address], [place], [place] [address]    Report Status 11/26/2019 FINAL  Final  Surgical pcr screen     Status: Abnormal   Collection Time: 11/28/19  6:59 AM   Specimen: Nasal Mucosa; Nasal Swab  Result Value Ref Range Status   MRSA, PCR NEGATIVE NEGATIVE Final   Staphylococcus aureus POSITIVE (A) NEGATIVE Final    Comment: (NOTE) The Xpert SA Assay (FDA approved for NASAL specimens in patients 73 years of age and older), is one component of a comprehensive surveillance  program. It is not intended to diagnose infection nor to guide or monitor treatment. Performed at [facility], [place] [address] [place], [place] [address]       Radiology Studies: No results found.     LOS: 7 days   Finnian Husted [employer] on [email or web address]  11/28/2019, 8:54 AM

## 2019-11-28 NOTE — Progress Notes (Signed)
Dr. Zada Finders at bedside. Patient very weak on left side.Grip is minimal and not much movement on the left foot/leg. Dr. Zada Finders said that was to be expected. Will continue to monitor.

## 2019-11-28 NOTE — Anesthesia Procedure Notes (Signed)
Procedure Name: Intubation Date/Time: 11/28/2019 6:03 PM Performed by: Eligha Bridegroom, CRNA Pre-anesthesia Checklist: Patient identified, Emergency Drugs available, Suction available, Patient being monitored and Timeout performed Patient Re-evaluated:Patient Re-evaluated prior to induction Oxygen Delivery Method: Circle system utilized Preoxygenation: Pre-oxygenation with 100% oxygen Induction Type: IV induction Ventilation: Mask ventilation without difficulty Laryngoscope Size: Mac and 4 Grade View: Grade I Tube type: Oral Number of attempts: 1 Airway Equipment and Method: Stylet Placement Confirmation: ETT inserted through vocal cords under direct vision,  positive ETCO2 and breath sounds checked- equal and bilateral

## 2019-11-28 NOTE — Anesthesia Preprocedure Evaluation (Addendum)
Anesthesia Evaluation  Patient identified by MRN, date of birth, ID band Patient awake    Reviewed: Allergy & Precautions, NPO status , Patient's Chart, lab work & pertinent test results  Airway Mallampati: II  TM Distance: >3 FB Neck ROM: Full    Dental no notable dental hx. (+) Teeth Intact, Dental Advisory Given   Pulmonary asthma , sleep apnea, Continuous Positive Airway Pressure Ventilation and Oxygen sleep apnea , COPD,  COPD inhaler, Current Smoker,  Hx of LUL lung CA s/p radiation   Pulmonary exam normal breath sounds clear to auscultation       Cardiovascular hypertension, Normal cardiovascular exam Rhythm:Regular Rate:Normal  11/28/19 EKG SR R 80 w NSST  11/22/19 TTE Left Ventricle: Left ventricular ejection fraction, by estimation, is 60 to 65%. The left ventricle has normal function. The left ventricle has no regional wall motion abnormalities. The left ventricular internal cavity size was normal in size. There is no left ventricular hypertrophy. Left ventricular diastolic parameters were normal. Right Ventricle: The right ventricular size is normal. No increase in right ventricular wall thickness. Right ventricular systolic function is normal. Tricuspid regurgitation signal is inadequate for assessing PA   Neuro/Psych PSYCHIATRIC DISORDERS Depression L sided weakness CVA, Residual Symptoms    GI/Hepatic (+) Hepatitis -, B  Endo/Other  negative endocrine ROS  Renal/GU CRFRenal diseaseStage III K+ 4.8 Cr 0.91      Musculoskeletal   Abdominal   Peds  Hematology  (+) HIV, Hgb16.0 T&S available    Anesthesia Other Findings   Reproductive/Obstetrics                            Anesthesia Physical Anesthesia Plan  ASA: III  Anesthesia Plan: General   Post-op Pain Management:    Induction: Intravenous  PONV Risk Score and Plan: 3 and Treatment may vary due to age or medical  condition, Ondansetron and Dexamethasone  Airway Management Planned: Oral ETT  Additional Equipment: Arterial line  Intra-op Plan:   Post-operative Plan: Extubation in OR  Informed Consent:     Dental advisory given  Plan Discussed with:   Anesthesia Plan Comments: (Ga w 2 large bore IVs  And aline)        Anesthesia Quick Evaluation

## 2019-11-28 NOTE — Progress Notes (Signed)
Pt wanted to hold off on CPAP, RT told pt to call if she decided she wanted to be placed on machine.

## 2019-11-28 NOTE — Transfer of Care (Signed)
Immediate Anesthesia Transfer of Care Note  Patient: [name]  Procedure(s) Performed: Right Craniotomy for Tumor Resection (Right Head) APPLICATION OF CRANIAL NAVIGATION (Right Head)  Patient Location: PACU  Anesthesia Type:General  Level of Consciousness: awake  Airway & Oxygen Therapy: Patient Spontanous Breathing and Patient connected to nasal cannula oxygen  Post-op Assessment: Report given to RN and Post -op Vital signs reviewed and stable  Post vital signs: Reviewed and stable  Last Vitals:  Vitals Value Taken Time  BP 117/65 11/28/19 2037  Temp    Pulse    Resp 26 11/28/19 2041  SpO2    Vitals shown include unvalidated device data.  Last Pain:  Vitals:   11/28/19 1512  TempSrc: Oral  PainSc:       Patients Stated Pain Goal: 1 (16/10/96 0454)  Complications: No apparent anesthesia complications

## 2019-11-28 NOTE — Progress Notes (Signed)
Neurosurgery Service Progress Note  Subjective: No acute events overnight   Objective: Vitals:   11/27/19 1640 11/27/19 2140 11/28/19 0023 11/28/19 0442  BP: 119/81 (!) 156/79 (!) 152/79 122/76  Pulse: 72 71 72 76  Resp: 18 18 16 18   Temp: 98 F (36.7 C) 98.2 F (36.8 C) 98.6 F (37 C) 98.6 F (37 C)  TempSrc: Oral  Oral Oral  SpO2: 94% 94% 92% 92%  Weight:      Height:       Temp (24hrs), Avg:98.2 F (36.8 C), Min:97.5 F (36.4 C), Max:98.6 F (37 C)  CBC Latest Ref Rng & Units 11/28/2019 11/27/2019 11/26/2019  WBC 4.0 - 10.5 K/uL 6.0 5.4 4.9  Hemoglobin 12.0 - 15.0 g/dL 16.0(H) 15.6(H) 15.0  Hematocrit 36.0 - 46.0 % 52.6(H) 51.4(H) 50.1(H)  Platelets 150 - 400 K/uL 212 204 208   BMP Latest Ref Rng & Units 11/28/2019 11/27/2019 11/26/2019  Glucose 70 - 99 mg/dL 106(H) 102(H) 99  BUN 8 - 23 mg/dL 21 20 18   Creatinine 0.44 - 1.00 mg/dL 0.91 0.95 0.83  BUN/Creat Ratio 6 - 22 (calc) - - -  Sodium 135 - 145 mmol/L 137 139 140  Potassium 3.5 - 5.1 mmol/L 4.8 4.5 4.3  Chloride 98 - 111 mmol/L 98 101 103  CO2 22 - 32 mmol/L 30 32 30  Calcium 8.9 - 10.3 mg/dL 9.3 9.1 9.0    Intake/Output Summary (Last 24 hours) at 11/28/2019 0727 Last data filed at 11/28/2019 0530 Gross per 24 hour  Intake 500 ml  Output 2200 ml  Net -1700 ml    Current Facility-Administered Medications:    acetaminophen (TYLENOL) tablet 650 mg, 650 mg, Oral, Q6H PRN, 650 mg at 11/27/19 1302 **OR** acetaminophen (TYLENOL) suppository 650 mg, 650 mg, Rectal, Q6H PRN, Myles Rosenthal A, MD   albuterol (PROVENTIL) (2.5 MG/3ML) 0.083% nebulizer solution 2.5 mg, 2.5 mg, Nebulization, Q4H PRN, Clance Boll, MD   bictegravir-emtricitabine-tenofovir AF (BIKTARVY) 50-200-25 MG per tablet 1 tablet, 1 tablet, Oral, Daily, Myles Rosenthal A, MD, 1 tablet at 11/27/19 0932   diclofenac Sodium (VOLTAREN) 1 % topical gel 4 g, 4 g, Topical, BID PRN, Myles Rosenthal A, MD   fluticasone furoate-vilanterol (BREO  ELLIPTA) 200-25 MCG/INH 1 puff, 1 puff, Inhalation, Daily, Myles Rosenthal A, MD, 1 puff at 11/27/19 0710   gabapentin (NEURONTIN) capsule 800 mg, 800 mg, Oral, TID, Myles Rosenthal A, MD, 800 mg at 11/27/19 2213   HYDROcodone-acetaminophen (NORCO/VICODIN) 5-325 MG per tablet 1 tablet, 1 tablet, Oral, Q6H PRN, Bonnielee Haff, MD, 1 tablet at 11/27/19 2214   loratadine (CLARITIN) tablet 10 mg, 10 mg, Oral, BID, Myles Rosenthal A, MD, 10 mg at 11/27/19 2214   LORazepam (ATIVAN) injection 1-2 mg, 1-2 mg, Intravenous, Q2H PRN, Clance Boll, MD   montelukast (SINGULAIR) tablet 10 mg, 10 mg, Oral, QHS, Myles Rosenthal A, MD, 10 mg at 11/27/19 2214   ondansetron (ZOFRAN) tablet 4 mg, 4 mg, Oral, Q6H PRN **OR** ondansetron (ZOFRAN) injection 4 mg, 4 mg, Intravenous, Q6H PRN, Myles Rosenthal A, MD   polyethylene glycol (MIRALAX / GLYCOLAX) packet 17 g, 17 g, Oral, BID, Bonnielee Haff, MD, 17 g at 11/27/19 2213   QUEtiapine (SEROQUEL XR) 24 hr tablet 150 mg, 150 mg, Oral, QHS, Myles Rosenthal A, MD, 150 mg at 11/27/19 2213   rOPINIRole (REQUIP) tablet 1 mg, 1 mg, Oral, QHS, Myles Rosenthal A, MD, 1 mg at 11/27/19 2214   senna (SENOKOT) tablet 17.2 mg, 2  tablet, Oral, QHS, Bonnielee Haff, MD, 17.2 mg at 11/27/19 2214   sodium chloride flush (NS) 0.9 % injection 3 mL, 3 mL, Intravenous, Once, Cardama, Grayce Sessions, MD   sodium phosphate (FLEET) 7-19 GM/118ML enema 1 enema, 1 enema, Rectal, Daily PRN, Bonnielee Haff, MD   umeclidinium bromide (INCRUSE ELLIPTA) 62.5 MCG/INH 1 puff, 1 puff, Inhalation, Daily, Dykstra, Ellwood Dense, MD, 1 puff at 11/27/19 0710   venlafaxine XR (EFFEXOR-XR) 24 hr capsule 150 mg, 150 mg, Oral, Daily, Myles Rosenthal A, MD, 150 mg at 11/27/19 0932   vitamin C (ASCORBIC ACID) tablet 125 mg, 125 mg, Oral, Daily, Clance Boll, MD, 125 mg at 11/27/19 0932   Physical Exam: AOx3, FCx4 with L 4/5 and +LUE drift, 5/5 on R  Assessment &  Plan: 74 y.o. 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 MRI brain w/ R convexity 3.5x2.4cm enhancing mass concerning for metastasis versus rapidly growing meningioma. CT CAP without evidence of primary. 5/27 vCTH done  -OR today for resection, 4N post-op  [name]  11/28/19 7:27 AM

## 2019-11-28 NOTE — Op Note (Signed)
PATIENT: [name]  DAY OF SURGERY: 11/28/19   PRE-OPERATIVE DIAGNOSIS:  Brain tumor   POST-OPERATIVE DIAGNOSIS:  Brain tumor   PROCEDURE:  Right craniotomy for resection of brain tumor   SURGEON:  Surgeon(s) and Role:    [name], MD - Primary     ANESTHESIA: ETGA   BRIEF HISTORY: This is a 74 year old woman who presented after a fall and being down for a prolonged period. She had confusion upon arrival and imaging showed significant enlargement of a right parasagittal mass that was previously imaged last winter. Metastatic workup was negative, given her history of prior lung cancer, I discussed with the patient that I recommend resection to obtain a diagnosis and guide treatment of this fairly rapidly enlarging mass. We also discussed at length that, given her multiple significant co-morbidities and the need to be off of anticoagulation for surgery, she has a high risk of perioperative complications. This was discussed with the patient as well as risks, benefits, and alternatives and wished to proceed with surgery.   OPERATIVE DETAIL: The patient was taken to the operating room and placed on the OR table in the supine position. A formal time out was performed with two patient identifiers and confirmed the operative site. Anesthesia was induced by the anesthesia team. The [place] head holder was applied to the head and a registration array was attached to the [place]. This was co-registered with the patient's preoperative imaging, the fit appeared to be acceptable. Using frameless stereotaxy, the operative trajectory was planned and the incision was marked. Hair was clipped with surgical clippers over the incision and the area was then prepped and draped in a sterile fashion.  A coronally oriented incision was placed centered over the tumor at the convexity. Soft tissues were dissected, the sagittal suture was identified and confirmed with stereotaxy. Burr holes were placed  over the sinus and to the right of midline to confirm that the sinus was free in the epidural plane. A craniotomy flap was then turned with special attention to the superior sagittal sinus.   The dura was flapped medially in the usual fashion and the tumor was immediately evident. It was vascular, adherent to both the dura and underlying cortex, firm, and clearly abnormal. It was unfortunately either densely adherent or invading the cortical surface at the majority of the border. I dissected it circumferentially and resected what was clearly abnormal. As expected, medially it was attached to the SSS, so I removed as much as possible and left a remnant on the lateral aspect of the SSS. The tumor was sent to pathology for further analysis.  Hemostasis was confirmed, given that the dura was grossly infiltrated, it was resected and a piece of duragen was placed over the dural defect. The bone flap was plated with titanium plates and screws then secured back in place.   All instrument and sponge counts were correct, the wound was copiously irrigated and the incision was then closed in layers. The patient was then returned to anesthesia for emergence. No apparent complications at the completion of the procedure.   EBL:  378mL   DRAINS: none   SPECIMENS: Right convexity brain tumor   [name], MD 11/28/19 5:50 PM

## 2019-11-28 NOTE — Progress Notes (Signed)
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; R Renal and Splenic Infarcts  No Known Allergies  Patient Measurements: Height: 5\' 9"  (175.3 cm) Weight: 87 kg (191 lb 12.8 oz) IBW/kg (Calculated) : 66.2 Heparin Dosing Weight:  73.5 kg  Vital Signs: Temp: 97.6 F (36.4 C) (06/01 0848) Temp Source: Oral (06/01 0848) BP: 114/78 (06/01 0848) Pulse Rate: 81 (06/01 0848)  Labs: Recent Labs    11/26/19 0453 11/26/19 0453 11/27/19 0453 11/28/19 0400  HGB 15.0   < > 15.6* 16.0*  HCT 50.1*  --  51.4* 52.6*  PLT 208  --  204 212  APTT 86*  --  79* 71*  HEPARINUNFRC 0.69  --  0.37 0.38  CREATININE 0.83  --  0.95 0.91   < > = values in this interval not displayed.    Estimated Creatinine Clearance: 65.7 mL/min (by C-G formula based on SCr of 0.91 mg/dL).  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  Pharmacy was consulted to transition pt's anticoagulation from apixaban to IV heparin (starting on 5/28) in anticipation of neurosurgery on 6/1.  Last Apixaban dose ~10pm on 5/27.  Pt has enlarging brain lesion with vasogenic edemia and mass effect; neurosurgery planning operative resection on 6/1 at 5pm.  Given pt's recent apixaban therapy, monitoring anticoagulation using aPTT until aPTT and heparin levels correlate.   APTT therapeutic (71 seconds) and heparin level therapeutic (0.38) at 4am on heparin drip at 1200 units/hr. Consistent with 5/31 values, correlating.  RN reports drip was held just before 7am today.  RN to inform neurosurgery of stop time.   Goal of Therapy:  Heparin level 0.3-0.7 units/ml aPTT 66-102 seconds Monitor platelets by anticoagulation protocol: Yes   Plan:   Heparin drip held this am for surgery this afternoon.  Will follow up  post-op for anticoagulation plans.  Arty Baumgartner, [place] Phone: [phone] 11/28/2019,10:07 AM

## 2019-11-29 ENCOUNTER — Encounter: Payer: Self-pay | Admitting: *Deleted

## 2019-11-29 ENCOUNTER — Inpatient Hospital Stay (HOSPITAL_COMMUNITY): Payer: Medicare Other

## 2019-11-29 DIAGNOSIS — Z86711 Personal history of pulmonary embolism: Secondary | ICD-10-CM

## 2019-11-29 DIAGNOSIS — N179 Acute kidney failure, unspecified: Secondary | ICD-10-CM

## 2019-11-29 DIAGNOSIS — I7410 Embolism and thrombosis of unspecified parts of aorta: Secondary | ICD-10-CM

## 2019-11-29 DIAGNOSIS — Z7901 Long term (current) use of anticoagulants: Secondary | ICD-10-CM

## 2019-11-29 DIAGNOSIS — N189 Chronic kidney disease, unspecified: Secondary | ICD-10-CM

## 2019-11-29 DIAGNOSIS — Z21 Asymptomatic human immunodeficiency virus [HIV] infection status: Secondary | ICD-10-CM

## 2019-11-29 LAB — CBC
HCT: 49.4 % — ABNORMAL HIGH (ref 36.0–46.0)
Hemoglobin: 14.5 g/dL (ref 12.0–15.0)
MCH: 27.8 pg (ref 26.0–34.0)
MCHC: 29.4 g/dL — ABNORMAL LOW (ref 30.0–36.0)
MCV: 94.8 fL (ref 80.0–100.0)
Platelets: 192 10*3/uL (ref 150–400)
RBC: 5.21 MIL/uL — ABNORMAL HIGH (ref 3.87–5.11)
RDW: 15.9 % — ABNORMAL HIGH (ref 11.5–15.5)
WBC: 7.3 10*3/uL (ref 4.0–10.5)
nRBC: 0 % (ref 0.0–0.2)

## 2019-11-29 LAB — BASIC METABOLIC PANEL
Anion gap: 8 (ref 5–15)
BUN: 18 mg/dL (ref 8–23)
CO2: 28 mmol/L (ref 22–32)
Calcium: 8.8 mg/dL — ABNORMAL LOW (ref 8.9–10.3)
Chloride: 101 mmol/L (ref 98–111)
Creatinine, Ser: 0.93 mg/dL (ref 0.44–1.00)
GFR calc Af Amer: >60 mL/min (ref >60)
GFR calc non Af Amer: >60 mL/min (ref >60)
Glucose, Bld: 112 mg/dL — ABNORMAL HIGH (ref 70–99)
Potassium: 4.8 mmol/L (ref 3.5–5.1)
Sodium: 137 mmol/L (ref 135–145)

## 2019-11-29 LAB — HEPARIN LEVEL (UNFRACTIONATED): Heparin Unfractionated: <0.1 IU/mL — ABNORMAL LOW (ref 0.30–0.70)

## 2019-11-29 LAB — APTT: aPTT: 31 seconds (ref 24–36)

## 2019-11-29 MED ORDER — MUPIROCIN 2 % EX OINT
1.0000 "application " | TOPICAL_OINTMENT | Freq: Two times a day (BID) | CUTANEOUS | Status: AC
  Administered 2019-11-29 – 2019-12-04 (×9): 1 via NASAL
  Filled 2019-11-29: qty 22

## 2019-11-29 MED ORDER — GADOBUTROL 1 MMOL/ML IV SOLN
7.5000 mL | Freq: Once | INTRAVENOUS | Status: AC | PRN
  Administered 2019-11-29: 7.5 mL via INTRAVENOUS

## 2019-11-29 MED ORDER — CHLORHEXIDINE GLUCONATE CLOTH 2 % EX PADS
6.0000 | MEDICATED_PAD | Freq: Every day | CUTANEOUS | Status: AC
  Administered 2019-11-29 – 2019-12-01 (×3): 6 via TOPICAL

## 2019-11-29 MED ORDER — SODIUM CHLORIDE 0.9 % IV SOLN
INTRAVENOUS | Status: DC | PRN
  Administered 2019-11-29: 1000 mL via INTRAVENOUS

## 2019-11-29 MED FILL — BIKTARVY 50-200-25 MG TABS: 50-200-25 | 30 days supply | Qty: 30 | Fill #5

## 2019-11-29 NOTE — Anesthesia Postprocedure Evaluation (Signed)
Anesthesia Post Note  Patient: [name]  Procedure(s) Performed: Right Craniotomy for Tumor Resection (Right Head) APPLICATION OF CRANIAL NAVIGATION (Right Head)     Patient location during evaluation: PACU Anesthesia Type: General Level of consciousness: sedated and patient cooperative Pain management: pain level controlled Vital Signs Assessment: post-procedure vital signs reviewed and stable Respiratory status: spontaneous breathing Cardiovascular status: stable Anesthetic complications: no    Last Vitals:  Vitals:   11/29/19 0700 11/29/19 0800  BP: 110/69 109/61  Pulse: 79 81  Resp: 12 13  Temp:  37 C  SpO2: 96% 95%    Last Pain:  Vitals:   11/29/19 0800  TempSrc: Oral  PainSc: 0-No pain                 Nolon Nations

## 2019-11-29 NOTE — Evaluation (Signed)
Occupational Therapy Evaluation Patient Details Name: [name] MRN: [id number] DOB: Jan 31, 1946 Today's Date: 11/29/2019    History of Present Illness 74yo F admitted with multiple falls prior and found to have enlarging brain lesion with vasogenic edema with mass effect. 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   Clinical Impression   Patient is s/p Craniotomy surgery with tumor resection resulting in functional limitations due to the deficits listed below (see OT problem list). Pt currently with flaccid L LE and only L hand activation noted. Pt with residual L deficits from prior CVA. Pt is legally blind but able to locate therapist r visual field and central vision.  Patient will benefit from skilled OT acutely to increase independence and safety with ADLS to allow discharge CIR recommended pending (A) levels at home. No family present during evaluation. If patient does not have appropriate (A) from family will requires SNF level care.      Follow Up Recommendations  CIR    Equipment Recommendations  Wheelchair (measurements OT);Wheelchair cushion (measurements OT);Hospital bed    Recommendations for Other Services Rehab consult     Precautions / Restrictions Precautions Precautions: Fall Restrictions Weight Bearing Restrictions: No      Mobility Bed Mobility Overal bed mobility: Needs Assistance Bed Mobility: Supine to Sit     Supine to sit: +2 for physical assistance;Max assist     General bed mobility comments: pt able to help by moving R LE toward EOB and pushign up with R UE. pt with no awareness to L side . pt requires immediate support of trunk and static sitting balance. therapist must position L UE / LE due to decrease  proprioception.   Transfers Overall transfer level: Needs assistance   Transfers: Sit to/from Stand;Stand Pivot Transfers Sit to Stand: +2 physical assistance;Max assist Stand pivot transfers: +2 physical assistance;Max assist       General transfer comment: pt requires blocking LLE for transfer. Therapists swinging hips and pt stepping with R LE toward chair    Balance Overall balance assessment: Needs assistance Sitting-balance support: Feet supported;Single extremity supported Sitting balance-Leahy Scale: Poor     Standing balance support: No upper extremity supported;During functional activity Standing balance-Leahy Scale: Zero                             ADL either performed or assessed with clinical judgement   ADL Overall ADL's : Needs assistance/impaired Eating/Feeding: Maximal assistance   Grooming: Maximal assistance Grooming Details (indicate cue type and reason): terminated task and decreased attention to task Upper Body Bathing: Maximal assistance   Lower Body Bathing: Maximal assistance   Upper Body Dressing : Maximal assistance   Lower Body Dressing: Maximal assistance   Toilet Transfer: +2 for physical assistance;Maximal assistance;Squat-pivot             General ADL Comments: pt currently with oxygen dependence of 5L or higher and cues every few minutes for arousal     Vision Baseline Vision/History: Legally blind Additional Comments: pt seeing shadows. pt able to state "i see that chair, i see you" attention to task making visual assessment difficult to formally test     Perception     Praxis  Pertinent Vitals/Pain Pain Assessment: Faces Faces Pain Scale: Hurts little more Pain Location: head Pain Descriptors / Indicators: Headache Pain Intervention(s): Monitored during session;Repositioned     Hand Dominance Right   Extremity/Trunk Assessment Upper Extremity Assessment Upper Extremity Assessment: LUE  deficits/detail LUE Deficits / Details: hand activation noted once during session.  LUE Sensation: decreased proprioception;decreased light touch LUE Coordination: decreased fine motor;decreased gross motor   Lower Extremity Assessment Lower Extremity Assessment: LLE deficits/detail LLE Deficits / Details: flaccid no activation noted. pt with facial grimace only to deep pressure at the toes.  LLE Sensation: decreased proprioception   Cervical / Trunk Assessment Cervical / Trunk Assessment: Other exceptions Cervical / Trunk Exceptions: left lateral trunk flexion due to weakness   Communication Communication Communication: HOH   Cognition Arousal/Alertness: Lethargic Behavior During Therapy: Flat affect Overall Cognitive Status: Difficult to assess                                 General Comments: pt requires cues for arousal to give answers. pt oriented to place time and recent fall. pt with no recall of surgery. pt unable to sustain arousal   General Comments  supine 91/67 RA 88 sitting eob 85/72 5.5 L 86 %  HR 91 chair sitting 99/57 7L 86% HR 89 allowed time to recover and 5L 94% [place]    Exercises     Shoulder Instructions      Home Living Family/patient expects to be discharged to:: Private residence Living Arrangements: Non-relatives/Friends                               Additional Comments: unknown PTA      Prior Functioning/Environment                   OT Problem List: Decreased strength;Decreased range of motion;Decreased activity tolerance;Impaired balance (sitting and/or standing);Decreased cognition;Decreased safety awareness;Decreased knowledge of use of DME or AE;Decreased knowledge of precautions;Impaired UE functional use;Cardiopulmonary status limiting activity;Decreased coordination;Impaired vision/perception      OT Treatment/Interventions: Self-care/ADL training;Therapeutic exercise;Neuromuscular education;Energy  conservation;DME and/or AE instruction;Manual therapy;Modalities;Therapeutic activities;Splinting;Cognitive remediation/compensation;Visual/perceptual remediation/compensation;Patient/family education;Balance training    OT Goals(Current goals can be found in the care plan section) Acute Rehab OT Goals Patient Stated Goal: fish and chips for lunch OT Goal Formulation: Patient unable to participate in goal setting Time For Goal Achievement: 12/13/19 Potential to Achieve Goals: Good  OT Frequency: Min 2X/week   Barriers to D/C: Decreased caregiver support          Co-evaluation PT/OT/SLP Co-Evaluation/Treatment: Yes Reason for Co-Treatment: Complexity of the patient's impairments (multi-system involvement);For patient/therapist safety;To address functional/ADL transfers   OT goals addressed during session: ADL's and self-care;Strengthening/ROM;Proper use of Adaptive equipment and DME      AM-PAC OT "6 Clicks" Daily Activity     Outcome Measure Help from another person eating meals?: A Lot Help from another person taking care of personal grooming?: A Lot Help from another person toileting, which includes using toliet, bedpan, or urinal?: A Lot Help from another person bathing (including washing, rinsing, drying)?: A Lot Help from another person to put on and taking off regular upper body clothing?: A Lot Help from another person to put on and taking off regular lower body clothing?: Total 6 Click Score: 11   End of Session Equipment Utilized During Treatment: Oxygen Nurse Communication: Mobility status;Precautions  Activity Tolerance: Patient limited by fatigue Patient left: in chair;with call bell/phone within reach;with chair alarm set  OT Visit Diagnosis: Unsteadiness on feet (R26.81);Muscle weakness (generalized) (M62.81);Hemiplegia and hemiparesis;History of falling (Z91.81) Hemiplegia - Right/Left: Left Hemiplegia - dominant/non-dominant: Non-Dominant                Time:  4360-1658 OT Time Calculation (min): 24 min Charges:  OT General Charges $OT Visit: 1 Visit OT Evaluation $OT Eval Moderate Complexity: 1 Mod   Brynn, OTR/L  Acute Rehabilitation Services Pager: [phone] Office: [phone] .   Jeri Modena 11/29/2019, 11:16 AM

## 2019-11-29 NOTE — Evaluation (Signed)
Physical Therapy Evaluation Patient Details Name: [name] MRN: [id number] DOB: [birth year] Today's Date: 11/29/2019   History of Present Illness  74yo F admitted with multiple falls prior and found to have enlarging brain lesion with vasogenic edema with mass effect. 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  Clinical Impression  Pt presents to PT with deficits in functional mobility, gait, balance, strength, power, endurance, cognition, awareness of deficits and safety awareness. Pt currently requires significant physical assistance to perform all functional mobility, with posterior and left lateral lean in all sitting activity and left lean in standing. Pt with no noted AROM of LLE during session, remaining flaccid throughout. Pt will benefit from continued acute PT POC to improve mobility quality, reduce caregiver burden, and to reduce falls risk. PT currently recommending CIR although will need to confirm prior level of function to ensure pt is a good candidate.    Follow Up Recommendations CIR;Supervision/Assistance - 24 hour    Equipment Recommendations  Wheelchair (measurements PT);Wheelchair cushion (measurements PT);Hospital bed(mechanical lift)    Recommendations for Other Services Rehab consult     Precautions / Restrictions Precautions Precautions: Fall Restrictions Weight Bearing Restrictions: No      Mobility  Bed Mobility Overal bed mobility: Needs Assistance Bed Mobility: Supine to Sit     Supine to sit: +2 for physical assistance;Max assist     General bed mobility comments: pt able to push up with RUE but requires assistance for BLE management and to raise trunk  Transfers Overall transfer level: Needs  assistance Equipment used: 2 person hand held assist Transfers: Sit to/from Stand;Stand Pivot Transfers Sit to Stand: +2 physical assistance;Max assist Stand pivot transfers: +2 physical assistance;Max assist       General transfer comment: PT provides block of L knee, PT/OT facilitating hip extension. Pt unable to advance LLE volitionally and requires PT assist to do so  Ambulation/Gait                Stairs            Wheelchair Mobility    Modified Rankin (Stroke Patients Only)       Balance Overall balance assessment: Needs assistance Sitting-balance support: Feet supported;Bilateral upper extremity supported Sitting balance-Leahy Scale: Zero Sitting balance - Comments: maxA to maintain static sitting, posterior or left lean Postural control: Posterior lean;Left lateral lean Standing balance support: No upper extremity supported;During functional activity Standing balance-Leahy Scale: Zero Standing balance comment: maxA to maintain static standing balance                             Pertinent Vitals/Pain Pain Assessment: Faces Pain Score: 0-No pain Faces Pain Scale: Hurts little more Pain Location: head Pain Descriptors / Indicators: Headache Pain Intervention(s): Monitored during session;Repositioned    Home Living Family/patient expects to be discharged to:: Private residence Living Arrangements: Non-relatives/Friends Available Help at Discharge: Family;Available PRN/intermittently Type of Home: House Home Access: Stairs to enter   Entrance Stairs-Number of Steps: 1 Home Layout: One level Home Equipment: Walker - 2 wheels;Tub bench;Hand held shower head Additional Comments: Majority of history obtained from prior admission chart review    Prior Function Level of Independence: (unable to determine)  Comments: pt with altered mental status, unable to accruately determine at this time     Hand Dominance   Dominant Hand:  Right    Extremity/Trunk Assessment   Upper Extremity Assessment Upper Extremity Assessment: Defer to OT evaluation LUE Deficits / Details: hand activation noted once during session.  LUE Sensation: decreased proprioception;decreased light touch LUE Coordination: decreased fine motor;decreased gross motor    Lower Extremity Assessment Lower Extremity Assessment: LLE deficits/detail LLE Deficits / Details: flaccid no activation noted. pt with facial grimace only to deep pressure at the toes.  LLE Sensation: decreased proprioception    Cervical / Trunk Assessment Cervical / Trunk Assessment: Other exceptions Cervical / Trunk Exceptions: left lateral trunk flexion due to weakness  Communication   Communication: HOH  Cognition Arousal/Alertness: Lethargic Behavior During Therapy: Flat affect Overall Cognitive Status: Difficult to assess                                 General Comments: pt requires frequent cues to maintain alert, oriented to place, time, and that she had a fall. Pt without recall of surgery      General Comments General comments (skin integrity, edema, etc.): pt on 5L [place] upon entry with sats in mid-90s. PT/OT attempt to wean supplemental oxygen and pt desats to mid-80s on RA. PT/OT return pt to 5L [place] and pt persists to sat in mid-80s. PT/OT increase supplemental oxygen to 7L/min with increase in sats to low 90s. Pt does cough some which seems to help increase sats as well as transition to sitting in recliner. Pt returned to 5L [place] at end of session with stable sats    Exercises     Assessment/Plan    PT Assessment Patient needs continued PT services  PT Problem List Decreased strength;Decreased activity tolerance;Decreased balance;Decreased mobility;Decreased coordination;Decreased cognition;Decreased safety awareness;Decreased knowledge of precautions;Decreased knowledge of use of DME       PT Treatment Interventions DME instruction;Gait  training;Stair training;Functional mobility training;Therapeutic activities;Therapeutic exercise;Balance training;Neuromuscular re-education;Cognitive remediation;Wheelchair mobility training;Patient/family education    PT Goals (Current goals can be found in the Care Plan section)  Acute Rehab PT Goals Patient Stated Goal: Pt perseverating on wanting fish and chips, does not provide a mobility goal. PT goal to improve transfer quality and reduce falls risk PT Goal Formulation: With patient Time For Goal Achievement: 12/13/19 Potential to Achieve Goals: Good    Frequency Min 4X/week   Barriers to discharge        Co-evaluation PT/OT/SLP Co-Evaluation/Treatment: Yes Reason for Co-Treatment: Complexity of the patient's impairments (multi-system involvement);Necessary to address cognition/behavior during functional activity;For patient/therapist safety;To address functional/ADL transfers PT goals addressed during session: Mobility/safety with mobility;Balance;Strengthening/ROM OT goals addressed during session: ADL's and self-care;Strengthening/ROM;Proper use of Adaptive equipment and DME       AM-PAC PT "6 Clicks" Mobility  Outcome Measure Help needed turning from your back to your side while in a flat bed without using bedrails?: A Lot Help needed moving from lying on your back to sitting on the side of a flat bed without using bedrails?: Total Help needed moving to and from a bed to a chair (including a wheelchair)?: Total Help needed standing up from a chair using your arms (e.g., wheelchair or bedside chair)?: Total Help needed to walk in hospital room?: Total Help needed climbing 3-5 steps with a railing? : Total 6 Click Score: 7    End of Session Equipment Utilized  During Treatment: Oxygen Activity Tolerance: Patient tolerated treatment well Patient left: in chair;with call bell/phone within reach;with chair alarm set Nurse Communication: Mobility status;Need for lift  equipment PT Visit Diagnosis: Unsteadiness on feet (R26.81);Muscle weakness (generalized) (M62.81);Other symptoms and signs involving the nervous system (R29.898)    Time: 1594-7076 PT Time Calculation (min) (ACUTE ONLY): 25 min   Charges:   PT Evaluation $PT Eval Moderate Complexity: 1 Mod          Zenaida Niece, PT, DPT Acute Rehabilitation Pager: [phone]   Zenaida Niece 11/29/2019, 12:50 PM

## 2019-11-29 NOTE — Consult Note (Signed)
Physical Medicine and Rehabilitation Consult Reason for Consult: Decreased functional ability with altered mental status Referring Physician: Dr. Venetia Constable  HPI: [name] is a 74 y.o. 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  History taken from chart review and patient due to cognitive deficits.Patient lives with friends and states they may be available upon discharge.  Reportedly independent with a walker prior to admission.  1 level home one-step to entry.  Question support of roommates.  She presented on 11/21/2019 after a fall with AMS.  She had near syncopal episode and was found facedown.  Noted blood pressure 80/50 saturations 87%.  She was treated with a 500 normal saline bolus in route to the ED. CT of the head showed right brain mass.  Per report, large isodense mass at the right apex with vasogenic edema as well as remote right MCA branch infarction.  MRI showed showed an extra-axial lesion at the vertex on the right with associated parenchymal edema and regional mass-effect.  CT lumbar cervical spine negative for fracture.  Admission chemistries with sodium 146, BUN 30, creatinine 1.18, hemoglobin 14.3, hepatic panel unremarkable, lactic acid 0.5.  Patient underwent right craniotomy for tumor resection on 11/28/2019 per Dr. Venetia Constable.  Subcutaneous heparin added for DVT prophylaxis 11/30/2019.  Follow-up MRI showing resection of right meningioma, chronic infarct.  Tolerating a regular diet.  Therapy evaluations completed with recommendations of physical medicine rehab consult.  Review of Systems  Constitutional: Negative for chills and fever.  HENT: Negative for hearing loss.   Eyes:  Negative for blurred vision and double vision.  Respiratory: Positive for cough and shortness of breath. Negative for wheezing.   Cardiovascular: Positive for leg swelling. Negative for chest pain and palpitations.  Gastrointestinal: Positive for constipation. Negative for heartburn, nausea and vomiting.  Genitourinary: Negative for dysuria, flank pain and hematuria.  Musculoskeletal: Positive for myalgias.       Recent fall  Skin: Negative for rash.  Neurological: Positive for speech change, focal weakness and weakness.  All other systems reviewed and are negative.  Past Medical History:  Diagnosis Date  . Asthma   . Atypical squamous cells of undetermined significance (ASCUS) on Papanicolaou smear of cervix   . Bed bug bite 07/12/2019  . COPD (chronic obstructive pulmonary disease) ([place])   . H/O drainage of abscess    left axilla, from left breast  . Hep B w/o coma   . HIV (human immunodeficiency virus infection) ([place])   . Hyperlipidemia   . Lung cancer ([place]) 12/14/13   LUL Adenocarcinoma  . Neuromuscular disorder ([place])    fingers/feet neuropathy  . Non-small cell lung cancer ([place])   . S/P radiation therapy 01/26/14-02/02/14   sbrt  lt upper lung-54Gy/26fx   Past Surgical History:  Procedure Laterality Date  . ABCESS DRAINAGE     abcess under Left arm, abcess from L breast  . APPLICATION OF CRANIAL NAVIGATION Right 11/28/2019   Procedure: APPLICATION OF CRANIAL NAVIGATION;  Surgeon: [name], MD;  Location: [facility];  Service: Neurosurgery;  Laterality: Right;  . BUBBLE STUDY  03/08/2019   Procedure: BUBBLE STUDY;  Surgeon: [name], MD;  Location: [facility];  Service: Cardiovascular;;  . CRANIOTOMY Right 11/28/2019   Procedure: Right Craniotomy for  Tumor Resection;  Surgeon: [name], MD;  Location: [facility];  Service: Neurosurgery;  Laterality: Right;  . removal of abnormal cells on uterus    . TEE WITHOUT CARDIOVERSION N/A 03/08/2019   Procedure:  TRANSESOPHAGEAL ECHOCARDIOGRAM (TEE);  Surgeon: [name], MD;  Location[facility] ENDOSCOPY;  Service: Cardiovascular;  Laterality: N/A;  . thyroid gland removed     Family History  Problem Relation Age of Onset  . Pneumonia Mother   . Hypertension Sister   . Diabetes Sister   . Cancer Sister        pancreatic and lung  . Cancer Brother        pancreatic  . Cancer Brother        pancreatic   Social History:  reports that she has been smoking cigarettes. She has a 40.00 pack-year smoking history. She has never used smokeless tobacco. She reports that she does not drink alcohol or use drugs. Allergies: No Known Allergies Medications Prior to Admission  Medication Sig Dispense Refill  . albuterol (PROVENTIL HFA;VENTOLIN HFA) 108 (90 BASE) MCG/ACT inhaler Inhale 1 puff into the lungs every 6 (six) hours as needed for shortness of breath.     Marland Kitchen albuterol (PROVENTIL) (2.5 MG/3ML) 0.083% nebulizer solution Take 3 mLs (2.5 mg total) by nebulization every 4 (four) hours as needed for wheezing or shortness of breath. 30 mL 0  . apixaban (ELIQUIS) 5 MG TABS tablet Take 1 tablet (5 mg total) by mouth 2 (two) times daily. 60 tablet 1  . Ascorbic Acid (VITAMIN C) 100 MG tablet Take 100 mg by mouth daily.    . B Complex-Biotin-FA (VITAMIN B50 COMPLEX PO) Take 1 tablet by mouth daily.    . bictegravir-emtricitabine-tenofovir AF (BIKTARVY) 50-200-25 MG TABS tablet Take 1 tablet by mouth daily. 30 tablet 11  . diclofenac Sodium (VOLTAREN) 1 % GEL Apply 4 g topically 2 (two) times daily as needed (pain).     . Fluticasone-Salmeterol (ADVAIR) 500-50 MCG/DOSE AEPB Inhale 1 puff into the lungs every 12 (twelve) hours.    . gabapentin (NEURONTIN) 800 MG tablet Take 800 mg by mouth 3 (three) times daily.    . hydrOXYzine (ATARAX/VISTARIL) 25 MG tablet Take 1 tablet (25 mg total) by mouth every 6 (six) hours. 12 tablet 0  . ibuprofen (ADVIL) 200 MG tablet Take 800 mg by mouth every 6 (six) hours as needed for  moderate pain.    Marland Kitchen loratadine (CLARITIN) 10 MG tablet Take 10 mg by mouth 2 (two) times daily.     . montelukast (SINGULAIR) 10 MG tablet Take 1 tablet (10 mg total) by mouth at bedtime. 30 tablet 6  . permethrin (ELIMITE) 5 % cream See admin instructions. apply to all body parts from neck to feet and wash off after 12 hours. May repeat in 7 days prn    . QUEtiapine Fumarate (SEROQUEL XR) 150 MG 24 hr tablet Take 150 mg by mouth at bedtime.    Marland Kitchen rOPINIRole (REQUIP) 1 MG tablet Take 1 tablet (1 mg total) by mouth at bedtime. 30 tablet 1  . SPIRIVA RESPIMAT 2.5 MCG/ACT AERS INHALE 2 PUFFS INTO THE LUNGS DAILY (Patient taking differently: Inhale 2 puffs into the lungs daily. ) 1 Inhaler 0  . tiZANidine (ZANAFLEX) 4 MG tablet Take 4 mg by mouth 2 (two) times daily as needed for muscle spasms.     Marland Kitchen triamcinolone ointment (KENALOG) 0.5 % Apply 1 application topically 2 (two) times daily. 30 g 1  .  venlafaxine XR (EFFEXOR-XR) 150 MG 24 hr capsule Take 150 mg by mouth daily.    Marland Kitchen VITAMIN A PO Take 1 tablet by mouth daily.      Home: Home Living Family/patient expects to be discharged to:: Private residence Living Arrangements: Non-relatives/Friends Available Help at Discharge: Family, Available PRN/intermittently Type of Home: House Home Access: Stairs to enter Technical brewer of Steps: 1 Home Layout: One level Biochemist, clinical: [place]: Environmental consultant - 2 wheels, Tub bench, Hand held shower head Additional Comments: Majority of history obtained from prior admission chart review  Functional History: Prior Function Level of Independence: (unable to determine) Comments: pt with altered mental status, unable to accruately determine at this time Functional Status:  Mobility: Bed Mobility Overal bed mobility: Needs Assistance Bed Mobility: Supine to Sit Supine to sit: +2 for physical assistance, Max assist General bed mobility comments: pt able to push up with RUE but requires  assistance for BLE management and to raise trunk Transfers Overall transfer level: Needs assistance Equipment used: 2 person hand held assist Transfers: Sit to/from Stand, Stand Pivot Transfers Sit to Stand: +2 physical assistance, Max assist Stand pivot transfers: +2 physical assistance, Max assist General transfer comment: PT provides block of L knee, PT/OT facilitating hip extension. Pt unable to advance LLE volitionally and requires PT assist to do so      ADL: ADL Overall ADL's : Needs assistance/impaired Eating/Feeding: Maximal assistance Grooming: Maximal assistance Grooming Details (indicate cue type and reason): terminated task and decreased attention to task Upper Body Bathing: Maximal assistance Lower Body Bathing: Maximal assistance Upper Body Dressing : Maximal assistance Lower Body Dressing: Maximal assistance Toilet Transfer: +2 for physical assistance, Maximal assistance, Squat-pivot General ADL Comments: pt currently with oxygen dependence of 5L or higher and cues every few minutes for arousal  Cognition: Cognition Overall Cognitive Status: Difficult to assess Orientation Level: Oriented X4 Cognition Arousal/Alertness: Lethargic Behavior During Therapy: Flat affect Overall Cognitive Status: Difficult to assess General Comments: pt requires frequent cues to maintain alert, oriented to place, time, and that she had a fall. Pt without recall of surgery Difficult to assess due to: Level of arousal  Blood pressure 118/70, pulse 91, temperature 97.7 F (36.5 C), resp. rate 18, height 5' 9.03" (1.753 m), weight 75.1 kg, SpO2 91 %. Physical Exam  Vitals reviewed. Constitutional: She is oriented to person, place, and time. She appears well-developed and well-nourished.  HENT:  Head: Normocephalic.  Craniotomy site C/D/I  Eyes: EOM are normal. Right eye exhibits no discharge. Left eye exhibits no discharge.  Neck: No tracheal deviation present. No thyromegaly  present.  Respiratory: Effort normal. No stridor. No respiratory distress.  + [place]  GI: Soft. She exhibits no distension.  Musculoskeletal:     Comments: No edema or tenderness in extremities  Neurological: She is alert and oriented to person, place, and time.  Makes good eye contact with examiner.   She follows commands.   Provides her name age and date of birth.   Limited medical historian. Motor: LUE/LLE: 0/5 proximal distal RUE: 4 -/5 proximal distal RLE: 3+-4 -/5 proximal distal Slurred speech  Skin: Skin is warm and dry.  ?  Facial bruising See above  Psychiatric: Her speech is slurred. She is slowed.    Results for orders placed or performed during the hospital encounter of 11/21/19 (from the past 24 hour(s))  APTT     Status: None   Collection Time: 11/30/19  3:59 AM  Result Value Ref Range  aPTT 32 24 - 36 seconds  Heparin level (unfractionated)     Status: Abnormal   Collection Time: 11/30/19  3:59 AM  Result Value Ref Range   Heparin Unfractionated <0.10 (L) 0.30 - 0.70 IU/mL  CBC     Status: Abnormal   Collection Time: 11/30/19  3:59 AM  Result Value Ref Range   WBC 9.3 4.0 - 10.5 K/uL   RBC 5.16 (H) 3.87 - 5.11 MIL/uL   Hemoglobin 14.4 12.0 - 15.0 g/dL   HCT 47.8 (H) 36.0 - 46.0 %   MCV 92.6 80.0 - 100.0 fL   MCH 27.9 26.0 - 34.0 pg   MCHC 30.1 30.0 - 36.0 g/dL   RDW 15.9 (H) 11.5 - 15.5 %   Platelets 202 150 - 400 K/uL   nRBC 0.0 0.0 - 0.2 %   MR BRAIN W WO CONTRAST  Result Date: 11/29/2019 CLINICAL DATA:  Brain tumor resection. EXAM: MRI HEAD WITHOUT AND WITH CONTRAST TECHNIQUE: Multiplanar, multiecho pulse sequences of the brain and surrounding structures were obtained without and with intravenous contrast. CONTRAST:  7.54mL GADAVIST GADOBUTROL 1 MMOL/ML IV SOLN COMPARISON:  Brain MRI 11/21/2019 FINDINGS: BRAIN: Status post lesion resection in the superior right hemisphere. There is blood at the resection cavity. There is also a small amount of blood along  the posterior aspect of the right hemisphere. No midline shift or other mass effect. Hyperintense T2-weighted signal within the right frontal and parietal lobes is unchanged. There is no residual nodular contrast enhancement at the resection site. There is left 10 meningeal contrast enhancement in the posterolateral right temporal lobe with T2-weighted signal that is unchanged. This is a site of an old infarct. VASCULAR: Major flow voids are preserved. SKULL AND UPPER CERVICAL SPINE: Normal calvarium and skull base. Visualized upper cervical spine and soft tissues are normal. SINUSES/ORBITS: No paranasal sinus fluid levels or advanced mucosal thickening. No mastoid or middle ear effusion. Normal orbits. IMPRESSION: 1. Status post resection of right convexity meningioma without residual nodular contrast enhancement. This will serve as baseline for future studies. 2. Unchanged small area of meningeal contrast enhancement in the posterolateral right temporal lobe, a post-ischemic phenomenon related to the infarct demonstrated 03/07/19. 3. Blood at the superior right convexity resection cavity and at the posterior right convexity. No significant mass effect. Electronically Signed   By: Ulyses Jarred M.D.   On: 11/29/2019 23:45    Assessment/Plan: Diagnosis: Left brain mass status post resection with history of stroke Labs independently reviewed.  Records reviewed and summated above.  1. Does the need for close, 24 hr/day medical supervision in concert with the patient's rehab needs make it unreasonable for this patient to be served in a less intensive setting? Yes  2. 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 Due to bladder management, bowel management, safety, skin/wound care, disease management, medication administration, pain management and patient education, does the patient require 24 hr/day rehab nursing? Yes 4. Does the patient require coordinated care of a physician, rehab nurse, therapy disciplines of PT/OT/SLP to address physical and functional deficits in the context of the above medical diagnosis(es)? Yes Addressing deficits in the following areas: balance, endurance, locomotion, strength,  transferring, bowel/bladder control, bathing, dressing, feeding, grooming, toileting, cognition, speech and psychosocial support 5. Can the patient actively participate in an intensive therapy program of at least 3 hrs of therapy per day at least 5 days per week? Potentially 6. The potential for patient to make measurable gains while on inpatient rehab is excellent 7. Anticipated functional outcomes upon discharge from inpatient rehab are min assist and mod assist  with PT, min assist and mod assist with OT, modified independent and supervision with SLP. 8. Estimated rehab length of stay to reach the above functional goals is: 20-24 days. 9. Anticipated discharge destination: TBD 10. Overall Rehab/Functional Prognosis: good  RECOMMENDATIONS: This patient's condition is appropriate for continued rehabilitative care in the following setting: CIR if adequate caregiver support available upon discharge when patient medically stable and able to tolerate 3 hours of therapy per day. Patient has agreed to participate in recommended program. Yes Note that insurance prior authorization may be required for reimbursement for recommended care.  Comment: Rehab Admissions Coordinator to follow up.  I have personally performed a face to face diagnostic evaluation, including, but not limited to relevant  history and physical exam findings, of this patient and developed relevant assessment and plan.  Additionally, I have reviewed and concur with the physician assistant's documentation above.   Delice Lesch, MD, ABPMR Lavon Paganini Angiulli, PA-C 11/30/2019

## 2019-11-29 NOTE — Progress Notes (Signed)
Neurosurgery Service Progress Note  Subjective: No acute events overnight   Objective: Vitals:   11/29/19 0500 11/29/19 0600 11/29/19 0700 11/29/19 0800  BP: 102/62 109/71 110/69 109/61  Pulse: 77 77 79 81  Resp: 12 13 12 13   Temp:    98.6 F (37 C)  TempSrc:    Oral  SpO2: 95% 96% 96% 95%  Weight:      Height:       Temp (24hrs), Avg:97.9 F (36.6 C), Min:97.5 F (36.4 C), Max:98.6 F (37 C)  CBC Latest Ref Rng & Units 11/29/2019 11/28/2019 11/27/2019  WBC 4.0 - 10.5 K/uL 7.3 6.0 5.4  Hemoglobin 12.0 - 15.0 g/dL 14.5 16.0(H) 15.6(H)  Hematocrit 36.0 - 46.0 % 49.4(H) 52.6(H) 51.4(H)  Platelets 150 - 400 K/uL 192 212 204   BMP Latest Ref Rng & Units 11/29/2019 11/28/2019 11/27/2019  Glucose 70 - 99 mg/dL 112(H) 106(H) 102(H)  BUN 8 - 23 mg/dL 18 21 20   Creatinine 0.44 - 1.00 mg/dL 0.93 0.91 0.95  BUN/Creat Ratio 6 - 22 (calc) - - -  Sodium 135 - 145 mmol/L 137 137 139  Potassium 3.5 - 5.1 mmol/L 4.8 4.8 4.5  Chloride 98 - 111 mmol/L 101 98 101  CO2 22 - 32 mmol/L 28 30 32  Calcium 8.9 - 10.3 mg/dL 8.8(L) 9.3 9.1    Intake/Output Summary (Last 24 hours) at 11/29/2019 0906 Last data filed at 11/29/2019 0800 Gross per 24 hour  Intake 1209.09 ml  Output 2075 ml  Net -865.91 ml    Current Facility-Administered Medications:  .  0.9 %  sodium chloride infusion, , Intravenous, PRN, [name], MD, Stopped at 11/29/19 0247 .  acetaminophen (TYLENOL) tablet 650 mg, 650 mg, Oral, Q6H PRN, 650 mg at 11/27/19 1302 **OR** acetaminophen (TYLENOL) suppository 650 mg, 650 mg, Rectal, Q6H PRN, Myles Rosenthal A, MD .  albuterol (PROVENTIL) (2.5 MG/3ML) 0.083% nebulizer solution 2.5 mg, 2.5 mg, Nebulization, Q4H PRN, Clance Boll, MD .  bictegravir-emtricitabine-tenofovir AF (BIKTARVY) 50-200-25 MG per tablet 1 tablet, 1 tablet, Oral, Daily, Clance Boll, MD, 1 tablet at 11/28/19 0851 .  ceFAZolin (ANCEF) IVPB 2g/100 mL premix, 2 g, Intravenous, Q8H, Lorrie Gargan, Joyice Faster,  MD, Stopped at 11/29/19 0154 .  Chlorhexidine Gluconate Cloth 2 % PADS 6 each, 6 each, Topical, Daily, [name], MD, 6 each at 11/28/19 2144 .  diclofenac Sodium (VOLTAREN) 1 % topical gel 4 g, 4 g, Topical, BID PRN, Myles Rosenthal A, MD .  docusate sodium (COLACE) capsule 100 mg, 100 mg, Oral, BID, [name], MD, 100 mg at 11/28/19 2137 .  fluticasone furoate-vilanterol (BREO ELLIPTA) 200-25 MCG/INH 1 puff, 1 puff, Inhalation, Daily, [name], MD, 1 puff at 11/28/19 0827 .  gabapentin (NEURONTIN) capsule 800 mg, 800 mg, Oral, TID, [name], MD, 800 mg at 11/28/19 2137 .  [START ON 11/30/2019] heparin injection 5,000 Units, 5,000 Units, Subcutaneous, Q8H, Dewight Catino A, MD .  HYDROcodone-acetaminophen (NORCO/VICODIN) 5-325 MG per tablet 1 tablet, 1 tablet, Oral, Q4H PRN, [name], MD, 1 tablet at 11/29/19 0448 .  HYDROmorphone (DILAUDID) injection 0.5 mg, 0.5 mg, Intravenous, Q3H PRN, [name], MD, 0.5 mg at 11/29/19 0116 .  labetalol (NORMODYNE) injection 10-40 mg, 10-40 mg, Intravenous, Q10 min PRN, [name], MD .  loratadine (CLARITIN) tablet 10 mg, 10 mg, Oral, BID, [name], MD, 10 mg at 11/28/19 2137 .  LORazepam (ATIVAN) injection 1-2 mg, 1-2  mg, Intravenous, Q2H PRN, [name], MD .  montelukast (SINGULAIR) tablet 10 mg, 10 mg, Oral, QHS, Vedha Tercero, Joyice Faster, MD, 10 mg at 11/28/19 2155 .  ondansetron (ZOFRAN) tablet 4 mg, 4 mg, Oral, Q4H PRN **OR** ondansetron (ZOFRAN) injection 4 mg, 4 mg, Intravenous, Q4H PRN, [name], MD, 4 mg at 11/28/19 2138 .  polyethylene glycol (MIRALAX / GLYCOLAX) packet 17 g, 17 g, Oral, BID, [name], MD, 17 g at 11/28/19 2138 .  polyethylene glycol (MIRALAX / GLYCOLAX) packet 17 g, 17 g, Oral, Daily PRN, [name], MD .  promethazine (PHENERGAN) tablet 12.5-25 mg, 12.5-25 mg, Oral, Q4H PRN, [name], MD .  QUEtiapine  (SEROQUEL XR) 24 hr tablet 150 mg, 150 mg, Oral, QHS, [place], Joyice Faster, MD, 150 mg at 11/28/19 2347 .  rOPINIRole (REQUIP) tablet 1 mg, 1 mg, Oral, QHS, Myles Rosenthal A, MD, 1 mg at 11/28/19 2155 .  senna (SENOKOT) tablet 17.2 mg, 2 tablet, Oral, QHS, Bonnielee Haff, MD, 17.2 mg at 11/28/19 2137 .  sodium chloride flush (NS) 0.9 % injection 3 mL, 3 mL, Intravenous, Once, [place], Grayce Sessions, MD .  sodium phosphate (FLEET) 7-19 GM/118ML enema 1 enema, 1 enema, Rectal, Daily PRN, Bonnielee Haff, MD .  umeclidinium bromide (INCRUSE ELLIPTA) 62.5 MCG/INH 1 puff, 1 puff, Inhalation, Daily, [name], MD, 1 puff at 11/28/19 [id number] .  venlafaxine XR (EFFEXOR-XR) 24 hr capsule 150 mg, 150 mg, Oral, Daily, Alaiyah Bollman A, MD, 150 mg at 11/28/19 1055 .  vitamin C (ASCORBIC ACID) tablet 125 mg, 125 mg, Oral, Daily, [name], MD, 125 mg at 11/28/19 1191   Physical Exam: AOx3, FCx4 with LUE 4-/5, 0/5 in LLE, 5/5 on R Incision c/d/i  Assessment & Plan: 74 y.o. 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 MRI brain w/ R convexity 3.5x2.4cm enhancing mass concerning for metastasis versus rapidly growing meningioma. CT CAP without evidence of primary. 5/27 vCTH done, 6/1 s/p craniotomy for tumor resection  -d/c foley, if able to get PIVs then d/c IJ CVC -MRI w/wo today to evaluate for size of residual, okay to go without nursing -transfer to stepdown -SCDs/TEDs, PPx dose SQH tomorrow, full anticoagulation restart plan pending MRI findings  [name]  11/29/19 9:06 AM

## 2019-11-29 NOTE — Progress Notes (Signed)
Got pt on MRI table and started exam but pt was unable to hold still. We stopped exam and talked to her but we were unable to redirect her. A phone call was placed to the floor to see if they would like to try some sedation meds but were directed to send pt back to the floor.

## 2019-11-29 NOTE — Progress Notes (Signed)
Inpatient Rehab Admissions Coordinator Note:   Per PT/OT recommendations, pt was screened for CIR candidacy by Shann Medal, PT, DPT.  At this time we are recommending a CIR consult.  I will place an order per our protocol.  Please contact me with questions.   Shann Medal, PT, DPT [phone] 11/29/19 1:59 PM

## 2019-11-29 NOTE — Progress Notes (Signed)
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 Per ems patient was found face down, but noted to able to give some history of her fall and noted complaints of pain on her left hip and back and denied any LOC at that time. Per EMS patient was found to be unkempt and her room was note to be squalid condition. At the scene her blood pressure as 80/50, sat was 87% she was off her home O2.She was treated with 500 ns en route.  ED Course: In the ED patient was noted to be lethargic and was unable to give history initial and worsen ensured related to change in mental status.   Evaluation was positive for a brain lesion.  Patient was hospitalized for further management. The patient was admitted to the ICU on 4N. She underwent a right craniotomy for resection of brain tumor on 11/28/2019 with Dr. Zada Finders. She has tolerated the procedure well.   Consultants  . Neurosurgery Procedures  Right craniotomy for resection of brain tumor (11/28/2019).  Antibiotics   Anti-infectives (From admission, onward)   Start     Dose/Rate Route Frequency Ordered Stop   11/29/19 0200  ceFAZolin (ANCEF) IVPB 2g/100 mL premix     2 g 200 mL/hr over 30 Minutes Intravenous Every 8 hours 11/28/19 2119 11/29/19 1105   11/28/19 1815  ceFAZolin (ANCEF) IVPB 2g/100 mL premix     2 g 200  mL/hr over 30 Minutes Intravenous  Once 11/28/19 1804 11/28/19 1821   11/28/19 1730  bacitracin 50,000 Units in sodium chloride 0.9 % 500 mL irrigation  Status:  Discontinued       As needed 11/28/19 1731 11/28/19 2033   11/21/19 2000  bictegravir-emtricitabine-tenofovir AF (BIKTARVY) 50-200-25 MG per tablet 1 tablet     1 tablet Oral Daily 11/21/19 1953      .  Subjective  The patient is sitting up at bedside. No new complaints.   Objective   Vitals:  Vitals:   11/29/19 1400 11/29/19 1500  BP:  (!) 105/55  Pulse: 86 87  Resp: 17 18  Temp:    SpO2: 92% 91%    Exam:  Constitutional:  . The patient is awake, alert, and oriented x 3. No acute distress. Respiratory:  . No increased work of breathing. . No wheezes, rales, or rhonchi . No tactile fremitus Cardiovascular:  . Regular rate and rhythm . No murmurs, ectopy, or gallups. . No lateral PMI. No thrills. Abdomen:  . Abdomen is soft, non-tender, non-distended . No hernias, masses, or organomegaly . Normoactive bowel sounds.  Musculoskeletal:  . No cyanosis, clubbing, or edema Skin:  . No rashes, lesions, ulcers . palpation of skin: no induration or nodules Neurologic:  . CN 2-12 intact . Sensation all 4 extremities intact Psychiatric:  . Mental status o Mood, affect appropriate o Orientation to person, place, time  . judgment and insight appear intact  I have personally reviewed the following:   Today's Data  .  Vitals, BMP, CBC  Micro Data  . PCR negative for MRSA, positive for staph aureus  Imaging  . MR Brain  Cardiology Data  . Echo 11/22/2019: EF 18-56%, RV systolic function is normal and RV size is normal. PA pressure cannot be assessed. Atrial septum is grossly normal.  Scheduled Meds: . bictegravir-emtricitabine-tenofovir AF  1 tablet Oral Daily  . Chlorhexidine Gluconate Cloth  6 each Topical Daily  . Chlorhexidine Gluconate Cloth  6 each Topical Daily  . docusate sodium  100 mg Oral BID  .  fluticasone furoate-vilanterol  1 puff Inhalation Daily  . gabapentin  800 mg Oral TID  . [START ON 11/30/2019] heparin injection (subcutaneous)  5,000 Units Subcutaneous Q8H  . loratadine  10 mg Oral BID  . montelukast  10 mg Oral QHS  . mupirocin ointment  1 application Nasal BID  . polyethylene glycol  17 g Oral BID  . QUEtiapine Fumarate  150 mg Oral QHS  . rOPINIRole  1 mg Oral QHS  . senna  2 tablet Oral QHS  . sodium chloride flush  3 mL Intravenous Once  . umeclidinium bromide  1 puff Inhalation Daily  . venlafaxine XR  150 mg Oral Daily  . vitamin C  125 mg Oral Daily   Continuous Infusions: . sodium chloride Stopped (11/29/19 1034)    Active Problems:   Change in mental status   Brain tumor ([place])   LOS: 8 days   A & P  Enlarging Brain lesion with vasogenic edema and Mass effect: S/P craniotomy with excision of brain tumor.  There has been concern for seizure activity.  Based on H&P this issue was discussed with a neurologist who did not recommend steroids or antiepileptic drugs at this time. EEG was performed and did not show any epileptiform activity.  Neurosurgery is following. They recommended staging work-up due to patient's previous history of lung cancer. Patient previously seen by Dr. [name]. CT scan of the chest abdomen pelvis does not show any evidence for either primary tumor or metastatic process.   She was started on IV heparin bridge per pharmacy.  Discontinued at midnight by neurosurgery. Neurosurgery has planned to restart prophylactic dose [facility] tomorrow with restart of full anticoagulation to resume pending MRI findings. The patient has tolerated the surgery well.  Acute Encephalopathy: Seems to be back to her baseline.  Could be secondary to fall, dehydration, brain lesion.  Acute hypoxic/hypercarbic respiratory failure on chronic hypoxic respiratory failure:   Possibly related to noncompliance with home oxygen.  She is on home oxygen at 3 to 4 L/min.   Remained stable on supplemental oxygen.  She is currently saturating 95% on 5L by nasal cannula.  Hx of Lung CA: This involved her left upper lobe.  Appears that she underwent ?SRS and radiation treatment.  No evidence for recurrence on CT scan.  Mild AKI on CKDIII: Resolved with IV hydration.  Hypokalemia: Both potassium and magnesium were repleted.    HIV:  Noted. Stable. Continue antiretrovirals.  Fall ,Mechanical: No injuries noted on work-up.-no fx on noted on work up.  History of COPD /Asthma: Seems to be stable.  Continue home medication regimen.  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  This will need to be discontinued for her neurosurgery as discussed above. Discussed with Dr. Leonie Man with stroke service.  He recommends stopping apixaban and bridging with IV heparin about 3 to 4 days  prior to surgery. She was being bridged with IV heparin, held at midnight. Neurosurgery has planned to restart prophylactic dose [facility] tomorrow with restart of full anticoagulation to resume pending MRI findings.   Hx of CVA, nonhemorrhagic 9/20: Sequela of left sided weakness.  Patient on apixaban at home. See above.  History of essential hypertension: Patient's blood pressure were elevated initially.  Now she is noted to be normotensive.  Continue to monitor.  Leave her off of medications for now.    OSA: CPAP qhs   Chronic Tobacco abuse: Patient continue to smoke daily. Nicotine patch /encourage cessation.  Constipation: Resolved with laxatives.  I have seen and examined this patient myself. I have spent 35 minutes in her evaluation and care.  DVT Prophylaxis: Apixaban at home. Was on IV heparin as a bridge. Neurosurgery has planned to restart prophylactic dose [facility] tomorrow with restart of full anticoagulation to resume pending MRI findings.  Code Status: Full code Family Communication: Discussed  with the patient.  No family at bedside. Disposition Plan:  Status is: Inpatient  Omer Monter, DO [facility] Direct contact: see [email or web address]  7PM-7AM contact night coverage as above 11/29/2019, 4:06 PM  LOS: 8 days

## 2019-11-30 DIAGNOSIS — J449 Chronic obstructive pulmonary disease, unspecified: Secondary | ICD-10-CM

## 2019-11-30 DIAGNOSIS — E785 Hyperlipidemia, unspecified: Secondary | ICD-10-CM

## 2019-11-30 DIAGNOSIS — D496 Neoplasm of unspecified behavior of brain: Secondary | ICD-10-CM

## 2019-11-30 DIAGNOSIS — G936 Cerebral edema: Secondary | ICD-10-CM

## 2019-11-30 DIAGNOSIS — B20 Human immunodeficiency virus [HIV] disease: Secondary | ICD-10-CM

## 2019-11-30 DIAGNOSIS — I6930 Unspecified sequelae of cerebral infarction: Secondary | ICD-10-CM

## 2019-11-30 DIAGNOSIS — G4733 Obstructive sleep apnea (adult) (pediatric): Secondary | ICD-10-CM

## 2019-11-30 DIAGNOSIS — Z720 Tobacco use: Secondary | ICD-10-CM

## 2019-11-30 DIAGNOSIS — N183 Chronic kidney disease, stage 3 unspecified: Secondary | ICD-10-CM

## 2019-11-30 DIAGNOSIS — I10 Essential (primary) hypertension: Secondary | ICD-10-CM

## 2019-11-30 DIAGNOSIS — Z85118 Personal history of other malignant neoplasm of bronchus and lung: Secondary | ICD-10-CM

## 2019-11-30 DIAGNOSIS — G9340 Encephalopathy, unspecified: Secondary | ICD-10-CM

## 2019-11-30 LAB — CBC
HCT: 47.8 % — ABNORMAL HIGH (ref 36.0–46.0)
Hemoglobin: 14.4 g/dL (ref 12.0–15.0)
MCH: 27.9 pg (ref 26.0–34.0)
MCHC: 30.1 g/dL (ref 30.0–36.0)
MCV: 92.6 fL (ref 80.0–100.0)
Platelets: 202 10*3/uL (ref 150–400)
RBC: 5.16 MIL/uL — ABNORMAL HIGH (ref 3.87–5.11)
RDW: 15.9 % — ABNORMAL HIGH (ref 11.5–15.5)
WBC: 9.3 10*3/uL (ref 4.0–10.5)
nRBC: 0 % (ref 0.0–0.2)

## 2019-11-30 LAB — APTT: aPTT: 32 seconds (ref 24–36)

## 2019-11-30 LAB — HEPARIN LEVEL (UNFRACTIONATED): Heparin Unfractionated: <0.1 IU/mL — ABNORMAL LOW (ref 0.30–0.70)

## 2019-11-30 NOTE — Progress Notes (Signed)
RT placed pt on CPAP dream station for the night on home setting of 8 cmH2O w/6 lpm bled in to the system. RT will continue to monitor.

## 2019-11-30 NOTE — Progress Notes (Signed)
Pt on CPAP. O2 sat in the 80s with good waveform. Pt on [place] O2 on 4L with good waveform and O2 at 94%. Will continue to monitor.

## 2019-11-30 NOTE — Progress Notes (Signed)
Occupational Therapy Treatment Patient Details Name: [name] MRN: [id number] DOB: October 02, 1945 Today's Date: 11/30/2019    History of present illness 74yo F admitted with multiple falls prior and found to have enlarging brain lesion with vasogenic edema with mass effect. 6/1 brain mass removed with R craniotomy by Dr Christinia Gully  [facility] s/p fall with R MCA hemorrhagic R basal ganglia infarct HIV, adenocarcinoma of LUL s/p radiation therapy, HTN, HLD, COPD, chronic respiratory failure on 4 L/min oxygen at baseline, asthma, HBV, tobacco abuse, CKD III.   OT comments  Pt with improved tolerance for OT session this date from evaluation. Session focused on BADL engagement progress with L sided neuro recovery. Pt completed bed mobility with max A +2 and sit <> stands in stedy with max A +2. Pt needing increased L sided assist due to weakness and cues to obtain midline. While in stedy, pt taken to sink. Encouraged pt to use mirror for visual feedback to obtain midline (she can see outlines for this). She was able to self correct to midline ~3-5 times without therapist cueing but had difficulty maintaining with task engagement. L arm placed in position to facilitate elbow WB while pt performed grooming tasks. Had pt attend to tasks on L side, and and used L arm hand over hand techniques to progress pt as well. Pt does have baseline visual impairment, further complicating fully successful multisensory engagement. CIR remains best recommendation for this impairment, especially with improvements noted from eval date. Will continue to follow.   Follow Up Recommendations  CIR    Equipment Recommendations  Wheelchair (measurements OT);Wheelchair cushion (measurements OT);Hospital bed    Recommendations for Other Services Rehab consult    Precautions / Restrictions Precautions Precautions: Fall Precaution Comments: L hemi Restrictions Weight Bearing Restrictions: No       Mobility Bed Mobility Overal  bed mobility: Needs Assistance Bed Mobility: Supine to Sit     Supine to sit: Max assist;+2 for physical assistance     General bed mobility comments: pt able to push up with RUE but requires assistance for BLE management and to raise trunk  Transfers Overall transfer level: Needs assistance Equipment used: Ambulation equipment used Transfers: Sit to/from Stand Sit to Stand: Max assist;+2 physical assistance         General transfer comment: assist to rise hips and extend hips/knees to stand in stedy. Increased assist needed on L side due to flaccidity    Balance Overall balance assessment: Needs assistance Sitting-balance support: Feet supported;Bilateral upper extremity supported Sitting balance-Leahy Scale: Zero Sitting balance - Comments: mod-max fluctuating due to L sided weakness. Pt could initiate self correction when cued Postural control: Left lateral lean Standing balance support: Bilateral upper extremity supported;During functional activity Standing balance-Leahy Scale: Zero Standing balance comment: used stedy with L sided support due to hemiparesis. Frequent cues to obtain midline                           ADL either performed or assessed with clinical judgement   ADL Overall ADL's : Needs assistance/impaired Eating/Feeding: Minimal assistance;Sitting Eating/Feeding Details (indicate cue type and reason): assist due to lack of ability to use bil UE coordination Grooming: Moderate assistance;Sitting;Applying deodorant Grooming Details (indicate cue type and reason): pt in stedy at sink using R hand to wash face and UB. LUE positioned by OT for elbow weightbearing Upper Body Bathing: Moderate assistance;Sitting Upper Body Bathing Details (indicate cue type and reason): used R  arm with mod A to manage LUE to wash trunk and underarms             Toilet Transfer: Maximal assistance;+2 for physical assistance;+2 for safety/equipment Toilet Transfer  Details (indicate cue type and reason): used stedy         Functional mobility during ADLs: Maximal assistance;+2 for physical assistance;+2 for safety/equipment;Cueing for safety(stedy) General ADL Comments: on 4L [place]     Vision Baseline Vision/History: Legally blind Additional Comments: difficult fully assessing due to cognitition. States everything is blurry   Perception     Praxis      Cognition Arousal/Alertness: Awake/alert Behavior During Therapy: WFL for tasks assessed/performed Overall Cognitive Status: Impaired/Different from baseline Area of Impairment: Orientation;Attention;Memory;Following commands;Safety/judgement;Awareness;Problem solving                 Orientation Level: Disoriented to;Situation Current Attention Level: Focused Memory: Decreased short-term memory Following Commands: Follows one step commands with increased time Safety/Judgement: Decreased awareness of deficits Awareness: Intellectual Problem Solving: Slow processing;Decreased initiation;Requires verbal cues;Requires tactile cues General Comments: pt very tangential, talking about topics that do not relate in session. Needed frequent cues to stay on task.        Exercises     Shoulder Instructions       General Comments 4L [place] throughout session with VSS    Pertinent Vitals/ Pain          Home Living                                          Prior Functioning/Environment              Frequency  Min 2X/week        Progress Toward Goals  OT Goals(current goals can now be found in the care plan section)     Acute Rehab OT Goals Patient Stated Goal: get better and get home OT Goal Formulation: With patient Time For Goal Achievement: 12/13/19 Potential to Achieve Goals: Good  Plan      Co-evaluation    PT/OT/SLP Co-Evaluation/Treatment: Yes Reason for Co-Treatment: Complexity of the patient's impairments (multi-system involvement);For  patient/therapist safety;Necessary to address cognition/behavior during functional activity   OT goals addressed during session: ADL's and self-care;Proper use of Adaptive equipment and DME;Strengthening/ROM      AM-PAC OT "6 Clicks" Daily Activity     Outcome Measure   Help from another person eating meals?: A Little Help from another person taking care of personal grooming?: A Lot Help from another person toileting, which includes using toliet, bedpan, or urinal?: A Lot Help from another person bathing (including washing, rinsing, drying)?: A Lot Help from another person to put on and taking off regular upper body clothing?: A Lot Help from another person to put on and taking off regular lower body clothing?: Total 6 Click Score: 12    End of Session    OT Visit Diagnosis: Unsteadiness on feet (R26.81);Muscle weakness (generalized) (M62.81);Hemiplegia and hemiparesis;History of falling (Z91.81);Other symptoms and signs involving cognitive function Hemiplegia - Right/Left: Left Hemiplegia - dominant/non-dominant: Non-Dominant Hemiplegia - caused by: Cerebral infarction   Activity Tolerance Patient tolerated treatment well   Patient Left in chair;with call bell/phone within reach;with chair alarm set   Nurse Communication Mobility status;Need for lift equipment;Precautions        Time: 1011-1105 OT Time Calculation (min): 54 min  Charges: OT General  Charges $OT Visit: 1 Visit OT Treatments $Self Care/Home Management : 8-22 mins $Neuromuscular Re-education: 8-22 mins  Zenovia Jarred, MSOT, OTR/L Acute Rehabilitation Services [facility] Office Number: [phone] Pager: [phone]  Zenovia Jarred 11/30/2019, 1:38 PM

## 2019-11-30 NOTE — Progress Notes (Signed)
Physical Therapy Treatment Patient Details Name: [name] MRN: [id number] DOB: 1945/10/24 Today's Date: 11/30/2019    History of Present Illness 74yo F admitted with multiple falls prior and found to have enlarging brain lesion with vasogenic edema with mass effect. 6/1 brain mass removed with R craniotomy by Dr Christinia Gully  [facility] s/p fall with R MCA hemorrhagic R basal ganglia infarct HIV, adenocarcinoma of LUL s/p radiation therapy, HTN, HLD, COPD, chronic respiratory failure on 4 L/min oxygen at baseline, asthma, HBV, tobacco abuse, CKD III.    PT Comments    Patient is making progress toward PT goals and tolerated increased mobility well. Pt able to stand with +2 assist and work on sitting balance using Stedy standing frame and mirror. Continue to recommend CIR for further skilled PT services to maximize independence and safety with mobility.    Follow Up Recommendations  CIR;Supervision/Assistance - 24 hour     Equipment Recommendations  Wheelchair (measurements PT);Wheelchair cushion (measurements PT);Hospital bed(mechanical lift)    Recommendations for Other Services Rehab consult     Precautions / Restrictions Precautions Precautions: Fall Precaution Comments: L hemi Restrictions Weight Bearing Restrictions: No    Mobility  Bed Mobility Overal bed mobility: Needs Assistance Bed Mobility: Supine to Sit     Supine to sit: Max assist;+2 for physical assistance     General bed mobility comments: multimodal cues for sequencing; pt able to push up with RUE but requires assistance for BLE management and to raise trunk  Transfers Overall transfer level: Needs assistance Equipment used: Ambulation equipment used Transfers: Sit to/from Stand Sit to Stand: Max assist;+2 physical assistance;Mod assist         General transfer comment: assistance required to power, faciliate hip extension, and support L UE; pt able to stand from EOB and Stedy standing frame    Ambulation/Gait                 Stairs             Wheelchair Mobility    Modified Rankin (Stroke Patients Only)       Balance Overall balance assessment: Needs assistance Sitting-balance support: Feet supported;Bilateral upper extremity supported Sitting balance-Leahy Scale: Zero Sitting balance - Comments: less assist needed when pt holding onto foot board with R UE; mod-max fluctuating due to L sided weakness. Pt could initiate self correction when cued; worked on midline posture in front of mirror using Stedy standing frame as support  Postural control: Left lateral lean Standing balance support: Bilateral upper extremity supported;During functional activity Standing balance-Leahy Scale: Zero Standing balance comment: used stedy with L sided support due to hemiparesis. Frequent cues to obtain midline                            Cognition Arousal/Alertness: Awake/alert Behavior During Therapy: WFL for tasks assessed/performed Overall Cognitive Status: Impaired/Different from baseline Area of Impairment: Orientation;Attention;Memory;Following commands;Safety/judgement;Awareness;Problem solving                 Orientation Level: Disoriented to;Situation Current Attention Level: Focused Memory: Decreased short-term memory Following Commands: Follows one step commands with increased time Safety/Judgement: Decreased awareness of deficits Awareness: Intellectual Problem Solving: Slow processing;Decreased initiation;Requires verbal cues;Requires tactile cues General Comments: pt very tangential, talking about topics that do not relate in session. Needed frequent cues to stay on task.      Exercises      General Comments General comments (skin integrity, edema, etc.):  4L [place] throughout session with VSS      Pertinent Vitals/Pain Pain Assessment: Faces Faces Pain Scale: No hurt    Home Living                      Prior Function             PT Goals (current goals can now be found in the care plan section) Acute Rehab PT Goals Patient Stated Goal: get better and get home Progress towards PT goals: Progressing toward goals    Frequency    Min 4X/week      PT Plan Current plan remains appropriate    Co-evaluation PT/OT/SLP Co-Evaluation/Treatment: Yes Reason for Co-Treatment: Complexity of the patient's impairments (multi-system involvement);Necessary to address cognition/behavior during functional activity;For patient/therapist safety;To address functional/ADL transfers PT goals addressed during session: Mobility/safety with mobility;Balance OT goals addressed during session: ADL's and self-care;Proper use of Adaptive equipment and DME;Strengthening/ROM      AM-PAC PT "6 Clicks" Mobility   Outcome Measure  Help needed turning from your back to your side while in a flat bed without using bedrails?: A Lot Help needed moving from lying on your back to sitting on the side of a flat bed without using bedrails?: A Lot Help needed moving to and from a bed to a chair (including a wheelchair)?: Total Help needed standing up from a chair using your arms (e.g., wheelchair or bedside chair)?: A Lot Help needed to walk in hospital room?: Total Help needed climbing 3-5 steps with a railing? : Total 6 Click Score: 9    End of Session Equipment Utilized During Treatment: Oxygen Activity Tolerance: Patient tolerated treatment well Patient left: in chair;with call bell/phone within reach;with chair alarm set Nurse Communication: Mobility status;Need for lift equipment PT Visit Diagnosis: Unsteadiness on feet (R26.81);Muscle weakness (generalized) (M62.81);Other symptoms and signs involving the nervous system (R29.898)     Time: 9470-9628 PT Time Calculation (min) (ACUTE ONLY): 56 min  Charges:  $Therapeutic Activity: 8-22 mins $Neuromuscular Re-education: 8-22 mins                     Earney Navy,  PTA Acute Rehabilitation Services Pager: [phone] Office: [phone]     Darliss Cheney 11/30/2019, 1:56 PM

## 2019-11-30 NOTE — Progress Notes (Signed)
Inpatient Rehabilitation Admissions Coordinator  I contacted pt's brother by phone, BJ to clarify caregiver support after any rehab admit. He states patient does not have assist at home, but he feels patient will refuse any SNF rehab. Last year she received SNF at [facility] but did not like it. Patient not a candidate for inpt rehab due to lack of needed support, therefore SNF is recommended. I have alerted TOC team, Kathlee Nations, SW. We will sign off at this time.  Danne Baxter, RN, MSN Rehab Admissions Coordinator [phone] 11/30/2019 3:16 PM

## 2019-11-30 NOTE — Progress Notes (Signed)
Neurosurgery Service Progress Note  Subjective: No acute events overnight, having some headaches today but slowly improving, no N/V  Objective: Vitals:   11/29/19 2009 11/29/19 2353 11/30/19 0111 11/30/19 0425  BP: 124/69 122/67  112/65  Pulse: 89 89 88 84  Resp: 18 20 17 15   Temp: 98.5 F (36.9 C) 98.3 F (36.8 C)  98.8 F (37.1 C)  TempSrc: Oral Oral  Oral  SpO2: 93% 95% 90% 94%  Weight:      Height:       Temp (24hrs), Avg:98.7 F (37.1 C), Min:98.3 F (36.8 C), Max:99 F (37.2 C)  CBC Latest Ref Rng & Units 11/30/2019 11/29/2019 11/28/2019  WBC 4.0 - 10.5 K/uL 9.3 7.3 6.0  Hemoglobin 12.0 - 15.0 g/dL 14.4 14.5 16.0(H)  Hematocrit 36.0 - 46.0 % 47.8(H) 49.4(H) 52.6(H)  Platelets 150 - 400 K/uL 202 192 212   BMP Latest Ref Rng & Units 11/29/2019 11/28/2019 11/27/2019  Glucose 70 - 99 mg/dL 112(H) 106(H) 102(H)  BUN 8 - 23 mg/dL 18 21 20   Creatinine 0.44 - 1.00 mg/dL 0.93 0.91 0.95  BUN/Creat Ratio 6 - 22 (calc) - - -  Sodium 135 - 145 mmol/L 137 137 139  Potassium 3.5 - 5.1 mmol/L 4.8 4.8 4.5  Chloride 98 - 111 mmol/L 101 98 101  CO2 22 - 32 mmol/L 28 30 32  Calcium 8.9 - 10.3 mg/dL 8.8(L) 9.3 9.1    Intake/Output Summary (Last 24 hours) at 11/30/2019 0806 Last data filed at 11/30/2019 0346 Gross per 24 hour  Intake 100.18 ml  Output 300 ml  Net -199.82 ml    Current Facility-Administered Medications:  .  0.9 %  sodium chloride infusion, , Intravenous, PRN, [name], MD, Stopped at 11/29/19 1034 .  acetaminophen (TYLENOL) tablet 650 mg, 650 mg, Oral, Q6H PRN, 650 mg at 11/29/19 1359 **OR** acetaminophen (TYLENOL) suppository 650 mg, 650 mg, Rectal, Q6H PRN, Myles Rosenthal A, MD .  albuterol (PROVENTIL) (2.5 MG/3ML) 0.083% nebulizer solution 2.5 mg, 2.5 mg, Nebulization, Q4H PRN, Clance Boll, MD .  bictegravir-emtricitabine-tenofovir AF (BIKTARVY) 50-200-25 MG per tablet 1 tablet, 1 tablet, Oral, Daily, Clance Boll, MD, 1 tablet at 11/29/19  1031 .  Chlorhexidine Gluconate Cloth 2 % PADS 6 each, 6 each, Topical, Daily, [name], MD, 6 each at 11/29/19 2158 .  Chlorhexidine Gluconate Cloth 2 % PADS 6 each, 6 each, Topical, Daily, [name], MD, 6 each at 11/29/19 1700 .  diclofenac Sodium (VOLTAREN) 1 % topical gel 4 g, 4 g, Topical, BID PRN, Myles Rosenthal A, MD .  docusate sodium (COLACE) capsule 100 mg, 100 mg, Oral, BID, [name], MD, 100 mg at 11/29/19 2144 .  fluticasone furoate-vilanterol (BREO ELLIPTA) 200-25 MCG/INH 1 puff, 1 puff, Inhalation, Daily, [name], MD, 1 puff at 11/29/19 0907 .  gabapentin (NEURONTIN) capsule 800 mg, 800 mg, Oral, TID, [name], MD, 800 mg at 11/29/19 2144 .  heparin injection 5,000 Units, 5,000 Units, Subcutaneous, Q8H, [name], MD, 5,000 Units at 11/30/19 [phone] .  HYDROcodone-acetaminophen (NORCO/VICODIN) 5-325 MG per tablet 1 tablet, 1 tablet, Oral, Q4H PRN, [name], MD, 1 tablet at 11/29/19 2329 .  HYDROmorphone (DILAUDID) injection 0.5 mg, 0.5 mg, Intravenous, Q3H PRN, [name], MD, 0.5 mg at 11/30/19 0048 .  labetalol (NORMODYNE) injection 10-40 mg, 10-40 mg, Intravenous, Q10 min PRN, Kynzie Polgar A, MD .  loratadine (CLARITIN) tablet 10 mg, 10 mg,  Oral, BID, [name], MD, 10 mg at 11/29/19 2144 .  LORazepam (ATIVAN) injection 1-2 mg, 1-2 mg, Intravenous, Q2H PRN, Lisa Milian A, MD .  montelukast (SINGULAIR) tablet 10 mg, 10 mg, Oral, QHS, Traveion Ruddock, Joyice Faster, MD, 10 mg at 11/29/19 2144 .  mupirocin ointment (BACTROBAN) 2 % 1 application, 1 application, Nasal, BID, [name], MD, 1 application at 28/00/34 2147 .  ondansetron (ZOFRAN) tablet 4 mg, 4 mg, Oral, Q4H PRN **OR** ondansetron (ZOFRAN) injection 4 mg, 4 mg, Intravenous, Q4H PRN, [name], MD, 4 mg at 11/28/19 2138 .  polyethylene glycol (MIRALAX / GLYCOLAX) packet 17 g, 17 g, Oral, BID, [name], MD, 17 g at 11/29/19 2146 .  polyethylene glycol (MIRALAX / GLYCOLAX) packet 17 g, 17 g, Oral, Daily PRN, [name], MD .  promethazine (PHENERGAN) tablet 12.5-25 mg, 12.5-25 mg, Oral, Q4H PRN, [name], MD .  QUEtiapine (SEROQUEL XR) 24 hr tablet 150 mg, 150 mg, Oral, QHS, Bryar Dahms, Joyice Faster, MD, 150 mg at 11/29/19 2158 .  rOPINIRole (REQUIP) tablet 1 mg, 1 mg, Oral, QHS, Myles Rosenthal A, MD, 1 mg at 11/29/19 2144 .  senna (SENOKOT) tablet 17.2 mg, 2 tablet, Oral, QHS, Bonnielee Haff, MD, 17.2 mg at 11/29/19 2144 .  sodium chloride flush (NS) 0.9 % injection 3 mL, 3 mL, Intravenous, Once, [place], Grayce Sessions, MD .  sodium phosphate (FLEET) 7-19 GM/118ML enema 1 enema, 1 enema, Rectal, Daily PRN, Bonnielee Haff, MD .  umeclidinium bromide (INCRUSE ELLIPTA) 62.5 MCG/INH 1 puff, 1 puff, Inhalation, Daily, [name], MD, 1 puff at 11/29/19 0907 .  venlafaxine XR (EFFEXOR-XR) 24 hr capsule 150 mg, 150 mg, Oral, Daily, Daivon Rayos A, MD, 150 mg at 11/29/19 1030 .  vitamin C (ASCORBIC ACID) tablet 125 mg, 125 mg, Oral, Daily, [name], MD, 125 mg at 11/29/19 1031   Physical Exam: AOx3, FCx4 with LUE 4-/5, 3/5 in LLE, 5/5 on R Incision c/d/i  Assessment & Plan: 74 y.o. 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 MRI brain w/ R convexity 3.5x2.4cm enhancing mass concerning for metastasis versus rapidly growing meningioma. CT CAP without evidence of primary. 5/27 vCTH done, 6/1 s/p craniotomy for tumor resection, 6/2 MRI with GTR  -MRI with good resection, as I expected there are some blood products in the resection cavity. 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 We should hold off on restarting her full anticoagulation  until POD5, which would be 6/6. -SCDs/TEDs, okay for prophylactic dose SQH  Joyice Faster Chaselyn Nanney  11/30/19 8:06 AM

## 2019-11-30 NOTE — Progress Notes (Signed)
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 Per ems patient was found face down, but noted to able to give some history of her fall and noted complaints of pain on her left hip and back and denied any LOC at that time. Per EMS patient was found to be unkempt and her room was note to be squalid condition. At the scene her blood pressure as 80/50, sat was 87% she was off her home O2.She was treated with 500 ns en route.  ED Course: In the ED patient was noted to be lethargic and was unable to give history initial and worsen ensured related to change in mental status.   Evaluation was positive for a brain lesion.  Patient was hospitalized for further management. The patient was admitted to the ICU on 4N. She underwent a right craniotomy for resection of brain tumor on 11/28/2019 with Dr. Zada Finders. She has tolerated the procedure well.   Consultants  . Neurosurgery Procedures  Right craniotomy for resection of brain tumor (11/28/2019).  Antibiotics   Anti-infectives (From admission, onward)   Start     Dose/Rate Route Frequency Ordered Stop   11/29/19 0200  ceFAZolin (ANCEF) IVPB 2g/100 mL premix     2 g 200 mL/hr over 30 Minutes Intravenous Every 8 hours 11/28/19 2119 11/29/19 1105   11/28/19 1815  ceFAZolin (ANCEF) IVPB 2g/100 mL premix     2 g 200  mL/hr over 30 Minutes Intravenous  Once 11/28/19 1804 11/28/19 1821   11/28/19 1730  bacitracin 50,000 Units in sodium chloride 0.9 % 500 mL irrigation  Status:  Discontinued       As needed 11/28/19 1731 11/28/19 2033   11/21/19 2000  bictegravir-emtricitabine-tenofovir AF (BIKTARVY) 50-200-25 MG per tablet 1 tablet     1 tablet Oral Daily 11/21/19 1953       Subjective  The patient is sitting up at bedside. She is complaining of a headache that has not been relieved since she has had her surgery. No nausea or vomiting.  Objective   Vitals:  Vitals:   11/30/19 0911 11/30/19 1200  BP:  (!) 112/98  Pulse: 91 94  Resp: 18 16  Temp:  97.8 F (36.6 C)  SpO2: 91% 98%    Exam:  Constitutional:  . The patient is awake, alert, and oriented x 3. Moderate distress from headache. Respiratory:  . No increased work of breathing. . No wheezes, rales, or rhonchi . No tactile fremitus Cardiovascular:  . Regular rate and rhythm . No murmurs, ectopy, or gallups. . No lateral PMI. No thrills. Abdomen:  . Abdomen is soft, non-tender, non-distended . No hernias, masses, or organomegaly . Normoactive bowel sounds.  Musculoskeletal:  . No cyanosis, clubbing, or edema Skin:  . No rashes, lesions, ulcers . palpation of skin: no induration or nodules Neurologic:  . CN 2-12 intact . Sensation all 4 extremities intact Psychiatric:  . Mental status o Mood, affect appropriate o Orientation to person,  place, time  . judgment and insight appear intact  I have personally reviewed the following:   Today's Data  . Vitals, BMP, CBC  Micro Data  . PCR negative for MRSA, positive for staph aureus  Imaging  . MRI Brain 11/29/2019  Cardiology Data  . Echo 11/22/2019: EF 38-10%, RV systolic function is normal and RV size is normal. PA pressure cannot be assessed. Atrial septum is grossly normal.  Scheduled Meds: . bictegravir-emtricitabine-tenofovir AF  1 tablet Oral Daily  . Chlorhexidine  Gluconate Cloth  6 each Topical Daily  . Chlorhexidine Gluconate Cloth  6 each Topical Daily  . docusate sodium  100 mg Oral BID  . fluticasone furoate-vilanterol  1 puff Inhalation Daily  . gabapentin  800 mg Oral TID  . heparin injection (subcutaneous)  5,000 Units Subcutaneous Q8H  . loratadine  10 mg Oral BID  . montelukast  10 mg Oral QHS  . mupirocin ointment  1 application Nasal BID  . polyethylene glycol  17 g Oral BID  . QUEtiapine Fumarate  150 mg Oral QHS  . rOPINIRole  1 mg Oral QHS  . senna  2 tablet Oral QHS  . umeclidinium bromide  1 puff Inhalation Daily  . venlafaxine XR  150 mg Oral Daily  . vitamin C  125 mg Oral Daily   Continuous Infusions: . sodium chloride Stopped (11/29/19 1034)    Active Problems:   Change in mental status   Brain tumor ([place])   Cerebral edema (HCC)   Encephalopathy   HIV disease (HCC)   OSA (obstructive sleep apnea)   Dyslipidemia   History of CVA with residual deficit   LOS: 9 days   A & P  Enlarging Brain lesion with vasogenic edema and Mass effect: S/P craniotomy with excision of brain tumor.  There has been concern for seizure activity.  Based on H&P this issue was discussed with a neurologist who did not recommend steroids or antiepileptic drugs at this time. EEG was performed and did not show any epileptiform activity.  Neurosurgery is following. They recommended staging work-up due to patient's previous history of lung cancer. Patient previously seen by Dr. [name]. CT scan of the chest abdomen pelvis does not show any evidence for either primary tumor or metastatic process.   She was started on IV heparin bridge per pharmacy.  Discontinued at midnight by neurosurgery. Neurosurgery has planned to restart prophylactic dose [facility] tomorrow with restart of full anticoagulation to resume pending MRI findings. The patient has tolerated the surgery well. She does continue to have headache pain that has not been relieved by current pain  regimen. I will increase the dose of her oral percocet.  Acute Encephalopathy: Resolved. Seems to be back to her baseline.  Could be secondary to fall, dehydration, brain lesion.  Acute hypoxic/hypercarbic respiratory failure on chronic hypoxic respiratory failure:   Possibly related to noncompliance with home oxygen.  She is on home oxygen at 3 to 4 L/min.  Remained stable on supplemental oxygen.  She is currently saturating 98% on 4L by nasal cannula.  Hx of Lung CA: This involved her left upper lobe.  Appears that she underwent ?SRS and radiation treatment.  No evidence for recurrence on CT scan.  Mild AKI on CKDIII: Resolved with IV hydration.  Hypokalemia: Resolved. Both potassium and magnesium were repleted.    HIV:  Noted. Stable. Continue antiretrovirals.  Fall ,Mechanical: No injuries noted on work-up.-no fx on noted on work up.  History of  COPD /Asthma: Seems to be stable. Continue home medication regimen.  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  This will need to be discontinued for her neurosurgery as discussed above. Discussed with Dr. Leonie Man with stroke service.  He recommends stopping apixaban and bridging with IV heparin about 3 to 4 days prior to surgery. She was being bridged with IV heparin, held at midnight. Neurosurgery has planned to restart prophylactic dose SQH today with plans to restart of full anticoagulation to resume on 12/03/2019 after review of 6/2/202 MRI Brain findings.   Hx of CVA, nonhemorrhagic 9/20: [place]ela of left sided weakness.  Patient on apixaban at home. See above.  History of essential hypertension: Patient's blood pressure were elevated initially.  Now she is noted to be normotensive.  Continue to monitor.  Leave her off of medications for now.    OSA: CPAP qhs   Chronic Tobacco abuse: Patient continue to smoke daily. Nicotine patch /encourage  cessation.  Constipation: Resolved with laxatives.  I have seen and examined this patient myself. I have spent 32 minutes in her evaluation and care.  DVT Prophylaxis: Apixaban at home. Was on IV heparin as a bridge. Neurosurgery has planned to restart prophylactic dose [facility] tomorrow with restart of full anticoagulation to resume on 12/03/2019 after review of 6/2/202 MRI Brain findings.  Code Status: Full code Family Communication: Discussed with the patient. No family at bedside. Disposition Plan:  Status is: Inpatient  Massiel Stipp, DO [facility] Direct contact: see [email or web address]  7PM-7AM contact night coverage as above 11/30/2019, 4:06 PM  LOS: 8 days

## 2019-12-01 LAB — T-HELPER CELLS (CD4) COUNT (NOT AT ARMC)
CD4 % Helper T Cell: 30 % — ABNORMAL LOW (ref 33–65)
CD4 T Cell Abs: 714 /uL (ref 400–1790)

## 2019-12-01 LAB — CBC WITH DIFFERENTIAL/PLATELET
Abs Immature Granulocytes: 0.02 10*3/uL (ref 0.00–0.07)
Basophils Absolute: 0 10*3/uL (ref 0.0–0.1)
Basophils Relative: 0 %
Eosinophils Absolute: 0.1 10*3/uL (ref 0.0–0.5)
Eosinophils Relative: 1 %
HCT: 48.1 % — ABNORMAL HIGH (ref 36.0–46.0)
Hemoglobin: 14.5 g/dL (ref 12.0–15.0)
Immature Granulocytes: 0 %
Lymphocytes Relative: 33 %
Lymphs Abs: 2.5 10*3/uL (ref 0.7–4.0)
MCH: 27.8 pg (ref 26.0–34.0)
MCHC: 30.1 g/dL (ref 30.0–36.0)
MCV: 92.1 fL (ref 80.0–100.0)
Monocytes Absolute: 0.7 10*3/uL (ref 0.1–1.0)
Monocytes Relative: 9 %
Neutro Abs: 4.2 10*3/uL (ref 1.7–7.7)
Neutrophils Relative %: 57 %
Platelets: 234 10*3/uL (ref 150–400)
RBC: 5.22 MIL/uL — ABNORMAL HIGH (ref 3.87–5.11)
RDW: 15.8 % — ABNORMAL HIGH (ref 11.5–15.5)
WBC: 7.5 10*3/uL (ref 4.0–10.5)
nRBC: 0 % (ref 0.0–0.2)

## 2019-12-01 LAB — BASIC METABOLIC PANEL
Anion gap: 11 (ref 5–15)
BUN: 19 mg/dL (ref 8–23)
CO2: 30 mmol/L (ref 22–32)
Calcium: 9.5 mg/dL (ref 8.9–10.3)
Chloride: 97 mmol/L — ABNORMAL LOW (ref 98–111)
Creatinine, Ser: 0.82 mg/dL (ref 0.44–1.00)
GFR calc Af Amer: >60 mL/min (ref >60)
GFR calc non Af Amer: >60 mL/min (ref >60)
Glucose, Bld: 105 mg/dL — ABNORMAL HIGH (ref 70–99)
Potassium: 4.1 mmol/L (ref 3.5–5.1)
Sodium: 138 mmol/L (ref 135–145)

## 2019-12-01 LAB — SURGICAL PATHOLOGY

## 2019-12-01 NOTE — TOC Initial Note (Signed)
Transition of Care [facility]) - Initial/Assessment Note    Patient Details  Name: [name] MRN: [id number] Date of Birth: 02-07-1946  [facility] of [facility]) CM/SW Contact:    Bartholomew Crews, RN Phone Number: [phone] 12/01/2019, 12:19 PM  Clinical Narrative:                  Spoke with patient at the bedside to discuss transition plans once medically stable. PTA living in a house with 2 roommates who provide some limited assistance. States that a lady from "[employer] to take care of you" has been providing some assistance in the home while also attempting to reach PCP to complete PCS paperwork. Patient states that she is not able to get [facility] services d/t having bed bugs in the home. Patient states that her brother and pastor are assisting her with getting an exterminator.   Discussed DME needs at home to include hospital bed, hoyer lift, wheelchair, and 3N1. She also would like to have a hover round electric chair - advised that this was something that needed to be done outpatient. Already has oxygen at home from [employer] and a couple walkers.   Discussed recommendations for SNF for short term rehab with the hope of her getting stronger and being able to transition to her home. Patient stated that she is agreeable to SNF stating that she has been to [facility] in the past. Her first choice of facilities would be to return to [facility]   TOC following for transition needs.   Expected Discharge Plan: Skilled Nursing Facility Barriers to Discharge: Continued Medical Work up   Patient Goals and CMS Choice Patient states their goals for this hospitalization and ongoing recovery are:: agreeable to SNF CMS Medicare.gov Compare Post Acute Care list provided to:: Patient Choice offered to / list presented to : Patient  Expected Discharge Plan and Services Expected Discharge Plan: [place] In-house Referral: Clinical Social Work Discharge Planning Services: CM  Consult Post Acute Care Choice: [place] arrangements for the past 2 months: Single Family Home                 DME Arranged: N/A DME Agency: NA       HH Arranged: NA [place] Agency: NA        Prior Living Arrangements/Services Living arrangements for the past 2 months: [place] with:: Self, Roommate Patient language and need for interpreter reviewed:: Yes Do you feel safe going back to the place where you live?: Yes      Need for Family Participation in Patient Care: Yes (Comment) Care giver support system in place?: No (comment) Current home services: DME(home oxygen with [employer]; walker) Criminal Activity/Legal Involvement Pertinent to Current Situation/Hospitalization: No - Comment as needed  Activities of Daily Living Home Assistive Devices/Equipment: Walker (specify type), Cane (specify quad or straight) ADL Screening (condition at time of admission) Patient's cognitive ability adequate to safely complete daily activities?: Yes Is the patient deaf or have difficulty hearing?: No Does the patient have difficulty seeing, even when wearing glasses/contacts?: No Does the patient have difficulty concentrating, remembering, or making decisions?: Yes Patient able to express need for assistance with ADLs?: Yes Does the patient have difficulty dressing or bathing?: No Independently performs ADLs?: Yes (appropriate for developmental age) Does the patient have difficulty walking or climbing stairs?: Yes Weakness of Legs: Both Weakness of Arms/Hands: Both  Permission Sought/Granted  Emotional Assessment Appearance:: Appears stated age, Disheveled Attitude/Demeanor/Rapport: Engaged Affect (typically observed): Accepting Orientation: : Oriented to Self, Oriented to  Time, Oriented to Place, Oriented to Situation Alcohol / Substance Use: Not Applicable Psych Involvement: No (comment)  Admission diagnosis:  Cerebral edema  ([place]) [G93.6] Encephalopathy [G93.40] Hypoxia [R09.02] Change in mental status [R41.82] Brain tumor [facility]) [D49.6] Patient Active Problem List   Diagnosis Date Noted  . Cerebral edema (HCC)   . Encephalopathy   . HIV disease ([place])   . OSA (obstructive sleep apnea)   . Dyslipidemia   . History of CVA with residual deficit   . Brain tumor ([place]) 11/28/2019  . Change in mental status 11/21/2019  . Bed bug bite 07/12/2019  . DNR (do not resuscitate)   . Altered mental status   . Acute pulmonary embolism without acute cor pulmonale (HCC)   . Acute ischemic right MCA stroke ([place])   . Palliative care encounter   . Aortic embolism or thrombosis ([place])   . CVA (cerebral vascular accident) ([place]) 03/02/2019  . COPD (chronic obstructive pulmonary disease) ([place]) 03/01/2019  . PE (pulmonary thromboembolism) ([place]) 03/01/2019  . Tobacco abuse 03/01/2019  . 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[place]) 03/01/2019  . CKD (chronic kidney disease), stage III 03/01/2019  . Generalized weakness 03/01/2019  . Fall at home   . 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([place])   . Splenic infarct   . Acute and chronic respiratory failure (acute-on-chronic) ([place]) 02/11/2019  . CKD (chronic kidney disease) stage 3, GFR 30-59 ml/min 08/12/2016  . Lung nodule 10/12/2015  . Bronchitis 09/09/2015  . History of lung cancer 09/08/2015  . Thoracic aorta atherosclerosis ([place]) 09/08/2015  . Chronic obstructive pulmonary disease with (acute) exacerbation ([place]) 09/08/2015  . Sepsis ([place]) 03/28/2015  . CAP (community acquired pneumonia) 03/28/2015  . UTI (lower urinary tract infection) 03/28/2015  . Respiratory failure ([place]) 03/28/2015  . Non-small cell lung cancer ([place])   . Malignant neoplasm of upper lobe, bronchus or lung 01/03/2014  . Acute hypoxemic respiratory failure ([place]) 10/31/2013  . COPD with acute exacerbation ([place]) 10/30/2013  . COPD exacerbation ([place]) 10/30/2013  . Other and unspecified noninfectious gastroenteritis and colitis(558.9) 11/20/2012  .  Rectal bleed 11/19/2012  . Environmental allergies 11/02/2012  . Obesity 05/05/2012  . Renal insufficiency 05/05/2012  . Hyperglycemia 05/05/2012  . Asthma 12/10/2011  . Cigarette smoker 12/10/2011  . Hypercholesteremia 12/11/2010  . Hypertension 12/11/2010  . ALLERGIC RHINITIS, SEASONAL 07/03/2010  . COPD 07/03/2010  . HIV (human immunodeficiency virus infection) ([employer]) 12/09/2006  . DEPRESSION 12/09/2006  . BREAST MASS, BENIGN 12/09/2006  . OSTEOPOROSIS 12/09/2006   PCP:  Nolene Ebbs, MD Pharmacy:   [facility], [place] [address] [place] [address] [place] [address] Phone: [phone] Fax: [place], [place] [address] Phone: [phone] Fax: [phone]     Social Determinants of Health (SDOH) Interventions    Readmission Risk Interventions No flowsheet data found.

## 2019-12-01 NOTE — NC FL2 (Signed)
[place] LEVEL OF CARE SCREENING TOOL     IDENTIFICATION  Patient Name: [name] Birthdate: 1945/09/15 Sex: female Admission Date (Current Location): 11/21/2019  [place] and [place] Number:  [name] [id number] [place] and Address:  [facility], [place] [address], [place], [place] [address]      Provider Number: [id number]  Attending Physician Name and Address:  [name], MD  Relative Name and Phone Number:       Current Level of Care: Hospital Recommended Level of Care: [place] Prior Approval Number:    Date Approved/Denied:   PASRR Number: pending level 2  Discharge Plan: SNF    Current Diagnoses: Patient Active Problem List   Diagnosis Date Noted  . Cerebral edema (HCC)   . Encephalopathy   . HIV disease ([place])   . OSA (obstructive sleep apnea)   . Dyslipidemia   . History of CVA with residual deficit   . Brain tumor ([place]) 11/28/2019  . Change in mental status 11/21/2019  . Bed bug bite 07/12/2019  . DNR (do not resuscitate)   . Altered mental status   . Acute pulmonary embolism without acute cor pulmonale (HCC)   . Acute ischemic right MCA stroke ([place])   . Palliative care encounter   . Aortic embolism or thrombosis ([place])   . CVA (cerebral vascular accident) ([place]) 03/02/2019  . COPD (chronic obstructive pulmonary disease) ([place]) 03/01/2019  . PE (pulmonary thromboembolism) ([place]) 03/01/2019  . Tobacco abuse 03/01/2019  . 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 CKD (chronic kidney disease), stage III 03/01/2019  . Generalized weakness 03/01/2019  . Fall at home   . 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 Splenic infarct   . Acute and chronic respiratory failure (acute-on-chronic) ([place]) 02/11/2019  . CKD (chronic kidney disease) stage 3, GFR 30-59 ml/min 08/12/2016  . Lung nodule 10/12/2015  . Bronchitis 09/09/2015  . History of lung cancer 09/08/2015  . Thoracic aorta atherosclerosis ([place]) 09/08/2015   . Chronic obstructive pulmonary disease with (acute) exacerbation ([place]) 09/08/2015  . Sepsis ([place]) 03/28/2015  . CAP (community acquired pneumonia) 03/28/2015  . UTI (lower urinary tract infection) 03/28/2015  . Respiratory failure ([place]) 03/28/2015  . Non-small cell lung cancer ([place])   . Malignant neoplasm of upper lobe, bronchus or lung 01/03/2014  . Acute hypoxemic respiratory failure ([place]) 10/31/2013  . COPD with acute exacerbation ([place]) 10/30/2013  . COPD exacerbation ([place]) 10/30/2013  . Other and unspecified noninfectious gastroenteritis and colitis(558.9) 11/20/2012  . Rectal bleed 11/19/2012  . Environmental allergies 11/02/2012  . Obesity 05/05/2012  . Renal insufficiency 05/05/2012  . Hyperglycemia 05/05/2012  . Asthma 12/10/2011  . Cigarette smoker 12/10/2011  . Hypercholesteremia 12/11/2010  . Hypertension 12/11/2010  . ALLERGIC RHINITIS, SEASONAL 07/03/2010  . COPD 07/03/2010  . HIV (human immunodeficiency virus infection) ([place]) 12/09/2006  . DEPRESSION 12/09/2006  . BREAST MASS, BENIGN 12/09/2006  . OSTEOPOROSIS 12/09/2006    Orientation RESPIRATION BLADDER Height & Weight     Self, Time, Situation, Place  O2(4 lpm [place]) Incontinent Weight: 75.1 kg Height:  5' 9.03" (175.3 cm)  BEHAVIORAL SYMPTOMS/MOOD NEUROLOGICAL BOWEL NUTRITION STATUS      Continent Diet  AMBULATORY STATUS COMMUNICATION OF NEEDS Skin   Extensive Assist Verbally Surgical wounds(liquid glue to head incision)                       Personal Care Assistance Level of Assistance  Bathing, Dressing, Feeding  Bathing Assistance: Maximum assistance Feeding assistance: Limited assistance Dressing Assistance: Maximum assistance     Functional Limitations Info  Sight, Hearing, Speech Sight Info: Adequate Hearing Info: Adequate Speech Info: Adequate    SPECIAL CARE FACTORS FREQUENCY  PT (By licensed PT), OT (By licensed OT)     PT Frequency: PT at SNF to eval and treat a min of 5x week OT  Frequency: OT at SNF to eval and treat a min of 5x week            Contractures Contractures Info: Not present    Additional Factors Info  Code Status, Allergies Code Status Info: Full code Allergies Info: no known drug allergies           Current Medications (12/01/2019):  This is the current hospital active medication list Current Facility-Administered Medications  Medication Dose Route Frequency Provider Last Rate Last Admin  . 0.9 %  sodium chloride infusion   Intravenous PRN [name], MD   Stopped at 11/29/19 1034  . acetaminophen (TYLENOL) tablet 650 mg  650 mg Oral Q6H PRN Myles Rosenthal A, MD   650 mg at 11/29/19 1359   Or  . acetaminophen (TYLENOL) suppository 650 mg  650 mg Rectal Q6H PRN Myles Rosenthal A, MD      . albuterol (PROVENTIL) (2.5 MG/3ML) 0.083% nebulizer solution 2.5 mg  2.5 mg Nebulization Q4H PRN Clance Boll, MD      . bictegravir-emtricitabine-tenofovir AF (BIKTARVY) 50-200-25 MG per tablet 1 tablet  1 tablet Oral Daily Clance Boll, MD   1 tablet at 12/01/19 0944  . Chlorhexidine Gluconate Cloth 2 % PADS 6 each  6 each Topical Daily [name], MD   6 each at 12/01/19 0945  . Chlorhexidine Gluconate Cloth 2 % PADS 6 each  6 each Topical Daily [name], MD   6 each at 12/01/19 0945  . diclofenac Sodium (VOLTAREN) 1 % topical gel 4 g  4 g Topical BID PRN Myles Rosenthal A, MD      . docusate sodium (COLACE) capsule 100 mg  100 mg Oral BID [name], MD   100 mg at 12/01/19 0944  . fluticasone furoate-vilanterol (BREO ELLIPTA) 200-25 MCG/INH 1 puff  1 puff Inhalation Daily [name], MD   1 puff at 11/30/19 0911  . gabapentin (NEURONTIN) capsule 800 mg  800 mg Oral TID [name], MD   800 mg at 12/01/19 0944  . heparin injection 5,000 Units  5,000 Units Subcutaneous Q8H [name], MD   5,000 Units at 12/01/19 [phone]  . HYDROcodone-acetaminophen (NORCO/VICODIN) 5-325 MG  per tablet 1 tablet  1 tablet Oral Q4H PRN [name], MD   1 tablet at 12/01/19 0944  . HYDROmorphone (DILAUDID) injection 0.5 mg  0.5 mg Intravenous Q3H PRN [name], MD   0.5 mg at 11/30/19 2016  . labetalol (NORMODYNE) injection 10-40 mg  10-40 mg Intravenous Q10 min PRN [name], MD      . loratadine (CLARITIN) tablet 10 mg  10 mg Oral BID [name], MD   10 mg at 12/01/19 0944  . LORazepam (ATIVAN) injection 1-2 mg  1-2 mg Intravenous Q2H PRN [name], MD      . montelukast (SINGULAIR) tablet 10 mg  10 mg Oral QHS [name], MD   10 mg at 11/30/19 2233  . mupirocin ointment (BACTROBAN) 2 % 1 application  1 application  Nasal BID [name], MD   1 application at 66/59/93 [phone]  . ondansetron (ZOFRAN) tablet 4 mg  4 mg Oral Q4H PRN [name], MD       Or  . ondansetron [facility]) injection 4 mg  4 mg Intravenous Q4H PRN [name], MD   4 mg at 11/28/19 2138  . polyethylene glycol (MIRALAX / GLYCOLAX) packet 17 g  17 g Oral BID [name], MD   17 g at 12/01/19 0942  . polyethylene glycol (MIRALAX / GLYCOLAX) packet 17 g  17 g Oral Daily PRN [name], MD   17 g at 11/30/19 7793  . promethazine (PHENERGAN) tablet 12.5-25 mg  12.5-25 mg Oral Q4H PRN [name], MD      . QUEtiapine (SEROQUEL XR) 24 hr tablet 150 mg  150 mg Oral QHS [name], MD   150 mg at 11/30/19 2235  . rOPINIRole (REQUIP) tablet 1 mg  1 mg Oral QHS Myles Rosenthal A, MD   1 mg at 11/30/19 2233  . senna (SENOKOT) tablet 17.2 mg  2 tablet Oral QHS Bonnielee Haff, MD   17.2 mg at 11/30/19 2231  . sodium phosphate (FLEET) 7-19 GM/118ML enema 1 enema  1 enema Rectal Daily PRN Bonnielee Haff, MD      . umeclidinium bromide (INCRUSE ELLIPTA) 62.5 MCG/INH 1 puff  1 puff Inhalation Daily [name], MD   1 puff at 11/30/19 0911  . venlafaxine XR (EFFEXOR-XR) 24 hr capsule 150 mg  150 mg Oral Daily  [name], MD   150 mg at 12/01/19 0942  . vitamin C (ASCORBIC ACID) tablet 125 mg  125 mg Oral Daily [name], MD   125 mg at 12/01/19 9030     Discharge Medications: Please see discharge summary for a list of discharge medications.  Relevant Imaging Results:  Relevant Lab Results:   Additional Information SSN: [id number]  Bartholomew Crews, RN

## 2019-12-01 NOTE — Progress Notes (Signed)
Neurosurgery Service Progress Note  Subjective: No acute events overnight, no new complaints this morning  Objective: Vitals:   12/01/19 0046 12/01/19 0047 12/01/19 0328 12/01/19 0739  BP: 123/66 123/66 119/65 116/64  Pulse: 85 84 80 81  Resp:  17 18 18   Temp: 99.4 F (37.4 C) 99.4 F (37.4 C) 97.7 F (36.5 C) 98 F (36.7 C)  TempSrc: Oral Oral Oral Axillary  SpO2: 94% 94% 100% 96%  Weight:      Height:       Temp (24hrs), Avg:98.6 F (37 C), Min:97.7 F (36.5 C), Max:99.4 F (37.4 C)  CBC Latest Ref Rng & Units 11/30/2019 11/29/2019 11/28/2019  WBC 4.0 - 10.5 K/uL 9.3 7.3 6.0  Hemoglobin 12.0 - 15.0 g/dL 14.4 14.5 16.0(H)  Hematocrit 36.0 - 46.0 % 47.8(H) 49.4(H) 52.6(H)  Platelets 150 - 400 K/uL 202 192 212   BMP Latest Ref Rng & Units 11/29/2019 11/28/2019 11/27/2019  Glucose 70 - 99 mg/dL 112(H) 106(H) 102(H)  BUN 8 - 23 mg/dL 18 21 20   Creatinine 0.44 - 1.00 mg/dL 0.93 0.91 0.95  BUN/Creat Ratio 6 - 22 (calc) - - -  Sodium 135 - 145 mmol/L 137 137 139  Potassium 3.5 - 5.1 mmol/L 4.8 4.8 4.5  Chloride 98 - 111 mmol/L 101 98 101  CO2 22 - 32 mmol/L 28 30 32  Calcium 8.9 - 10.3 mg/dL 8.8(L) 9.3 9.1    Intake/Output Summary (Last 24 hours) at 12/01/2019 1020 Last data filed at 11/30/2019 2219 Gross per 24 hour  Intake 240 ml  Output 1200 ml  Net -960 ml    Current Facility-Administered Medications:  .  0.9 %  sodium chloride infusion, , Intravenous, PRN, [name], MD, Stopped at 11/29/19 1034 .  acetaminophen (TYLENOL) tablet 650 mg, 650 mg, Oral, Q6H PRN, 650 mg at 11/29/19 1359 **OR** acetaminophen (TYLENOL) suppository 650 mg, 650 mg, Rectal, Q6H PRN, Myles Rosenthal A, MD .  albuterol (PROVENTIL) (2.5 MG/3ML) 0.083% nebulizer solution 2.5 mg, 2.5 mg, Nebulization, Q4H PRN, Clance Boll, MD .  bictegravir-emtricitabine-tenofovir AF (BIKTARVY) 50-200-25 MG per tablet 1 tablet, 1 tablet, Oral, Daily, Clance Boll, MD, 1 tablet at 12/01/19 0944 .   Chlorhexidine Gluconate Cloth 2 % PADS 6 each, 6 each, Topical, Daily, [name], MD, 6 each at 12/01/19 0945 .  Chlorhexidine Gluconate Cloth 2 % PADS 6 each, 6 each, Topical, Daily, [name], MD, 6 each at 12/01/19 0945 .  diclofenac Sodium (VOLTAREN) 1 % topical gel 4 g, 4 g, Topical, BID PRN, Myles Rosenthal A, MD .  docusate sodium (COLACE) capsule 100 mg, 100 mg, Oral, BID, [name], MD, 100 mg at 12/01/19 0944 .  fluticasone furoate-vilanterol (BREO ELLIPTA) 200-25 MCG/INH 1 puff, 1 puff, Inhalation, Daily, [name], MD, 1 puff at 11/30/19 0911 .  gabapentin (NEURONTIN) capsule 800 mg, 800 mg, Oral, TID, [name], MD, 800 mg at 12/01/19 0944 .  heparin injection 5,000 Units, 5,000 Units, Subcutaneous, Q8H, [name], MD, 5,000 Units at 12/01/19 [phone] .  HYDROcodone-acetaminophen (NORCO/VICODIN) 5-325 MG per tablet 1 tablet, 1 tablet, Oral, Q4H PRN, [name], MD, 1 tablet at 12/01/19 0944 .  HYDROmorphone (DILAUDID) injection 0.5 mg, 0.5 mg, Intravenous, Q3H PRN, [name], MD, 0.5 mg at 11/30/19 2016 .  labetalol (NORMODYNE) injection 10-40 mg, 10-40 mg, Intravenous, Q10 min PRN, Apollonia Amini A, MD .  loratadine (CLARITIN) tablet 10 mg, 10 mg, Oral,  BID, [name], MD, 10 mg at 12/01/19 0944 .  LORazepam (ATIVAN) injection 1-2 mg, 1-2 mg, Intravenous, Q2H PRN, Electra Paladino A, MD .  montelukast (SINGULAIR) tablet 10 mg, 10 mg, Oral, QHS, Maiah Sinning, Joyice Faster, MD, 10 mg at 11/30/19 2233 .  mupirocin ointment (BACTROBAN) 2 % 1 application, 1 application, Nasal, BID, [name], MD, 1 application at 55/20/80 0947 .  ondansetron (ZOFRAN) tablet 4 mg, 4 mg, Oral, Q4H PRN **OR** ondansetron (ZOFRAN) injection 4 mg, 4 mg, Intravenous, Q4H PRN, [name], MD, 4 mg at 11/28/19 2138 .  polyethylene glycol (MIRALAX / GLYCOLAX) packet 17 g, 17 g, Oral, BID, [name], MD, 17  g at 12/01/19 0942 .  polyethylene glycol (MIRALAX / GLYCOLAX) packet 17 g, 17 g, Oral, Daily PRN, [name], MD, 17 g at 11/30/19 2233 .  promethazine (PHENERGAN) tablet 12.5-25 mg, 12.5-25 mg, Oral, Q4H PRN, [name], MD .  QUEtiapine (SEROQUEL XR) 24 hr tablet 150 mg, 150 mg, Oral, QHS, Mccade Sullenberger, Joyice Faster, MD, 150 mg at 11/30/19 2235 .  rOPINIRole (REQUIP) tablet 1 mg, 1 mg, Oral, QHS, Myles Rosenthal A, MD, 1 mg at 11/30/19 2233 .  senna (SENOKOT) tablet 17.2 mg, 2 tablet, Oral, QHS, Bonnielee Haff, MD, 17.2 mg at 11/30/19 2231 .  sodium phosphate (FLEET) 7-19 GM/118ML enema 1 enema, 1 enema, Rectal, Daily PRN, Bonnielee Haff, MD .  umeclidinium bromide (INCRUSE ELLIPTA) 62.5 MCG/INH 1 puff, 1 puff, Inhalation, Daily, [name], MD, 1 puff at 11/30/19 0911 .  venlafaxine XR (EFFEXOR-XR) 24 hr capsule 150 mg, 150 mg, Oral, Daily, [name], MD, 150 mg at 12/01/19 0942 .  vitamin C (ASCORBIC ACID) tablet 125 mg, 125 mg, Oral, Daily, [name], MD, 125 mg at 12/01/19 6122   Physical Exam: AOx3, FCx4 with LUE 4-/5, 3/5 in LLE, 5/5 on R Incision c/d/i  Assessment & Plan: 74 y.o. 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 MRI brain w/ R convexity 3.5x2.4cm enhancing mass concerning for metastasis versus rapidly growing meningioma. CT CAP without evidence of primary. 5/27 vCTH done, 6/1 s/p craniotomy for tumor resection, 6/2 MRI with GTR  -hold off on restarting her full anticoagulation until POD5, which would be 6/6. -SCDs/TEDs, okay for prophylactic dose SQH  Mildred Tuccillo [place]  12/01/19 10:20 AM

## 2019-12-01 NOTE — Plan of Care (Signed)
  Problem: Education: Goal: Knowledge of General Education information will improve Description: Including pain rating scale, medication(s)/side effects and non-pharmacologic comfort measures Outcome: Progressing   Problem: Nutrition: Goal: Adequate nutrition will be maintained Outcome: Progressing   Problem: Safety: Goal: Ability to remain free from injury will improve Outcome: Progressing   

## 2019-12-01 NOTE — Progress Notes (Signed)
RT placed patient on CPAP with 4L O2 bled into circuit. Patient tolerating well at this time. RT will monitor as needed. 

## 2019-12-01 NOTE — Progress Notes (Signed)
RE:  [name] Date of Birth: 1946/05/22 Date: 12/01/2019  To Whom It May Concern:  Please be advised that the above-named patient will require a short-term nursing home stay - anticipated 30 days or less for rehabilitation and strengthening.  The plan is for return home.

## 2019-12-01 NOTE — Progress Notes (Signed)
Physical Therapy Treatment Patient Details Name: [name] MRN: [id number] DOB: [birth year] Today's Date: 12/01/2019    History of Present Illness 74yo F admitted with multiple falls prior and found to have enlarging brain lesion with vasogenic edema with mass effect. 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    PT Comments    Patient in bed upon arrival and eager to mobilize. Pt continues to require +2 assist for functional transfer training. Stedy standing frame utilized for increased safety with OOB mobility. Continue to progress as tolerated with anticipated d/c to SNF for further skilled PT services.     Follow Up Recommendations  CIR;Supervision/Assistance - 24 hour     Equipment Recommendations  Wheelchair (measurements PT);Wheelchair cushion (measurements PT);Hospital bed(mechanical lift)    Recommendations for Other Services Rehab consult     Precautions / Restrictions Precautions Precautions: Fall Precaution Comments: L hemi Restrictions Weight Bearing Restrictions: No    Mobility  Bed Mobility Overal bed mobility: Needs Assistance Bed Mobility: Supine to Sit     Supine to sit: Mod assist;HOB elevated     General bed mobility comments: assist needed to bring hips EOB and to elevate trunk into sitting   Transfers Overall transfer level: Needs assistance   Transfers: Sit to/from Stand Sit to Stand: +2 physical assistance;Mod assist;From elevated surface;+2 safety/equipment         General transfer comment: assist for L hand placement and to maintain grip on Stedy standing frame; assistance to power up and to facilitate L hip extension  Ambulation/Gait                 Stairs             Wheelchair  Mobility    Modified Rankin (Stroke Patients Only)       Balance Overall balance assessment: Needs assistance Sitting-balance support: Feet supported;Single extremity supported Sitting balance-Leahy Scale: Poor Sitting balance - Comments: pt requires assistance to maintain sitting balance EOB if not holding onto foot board with R UE  Postural control: Left lateral lean Standing balance support: Bilateral upper extremity supported;During functional activity Standing balance-Leahy Scale: Zero                              Cognition Arousal/Alertness: Awake/alert Behavior During Therapy: WFL for tasks assessed/performed Overall Cognitive Status: Impaired/Different from baseline Area of Impairment: Attention;Memory;Following commands;Safety/judgement;Awareness;Problem solving                     Memory: Decreased short-term memory Following Commands: Follows one step commands consistently Safety/Judgement: Decreased awareness of deficits;Decreased awareness of safety Awareness: Intellectual Problem Solving: Requires verbal cues;Requires tactile cues;Slow processing;Decreased initiation General Comments: tangential thoughts       Exercises      General Comments        Pertinent Vitals/Pain Pain Assessment: No/denies pain    Home Living                      Prior Function            PT Goals (current goals can now be found in the care plan section) Acute Rehab PT Goals Patient Stated Goal: get better and  get home Progress towards PT goals: Progressing toward goals    Frequency    Min 4X/week      PT Plan Current plan remains appropriate    Co-evaluation              AM-PAC PT "6 Clicks" Mobility   Outcome Measure  Help needed turning from your back to your side while in a flat bed without using bedrails?: A Lot Help needed moving from lying on your back to sitting on the side of a flat bed without using bedrails?: A  Lot Help needed moving to and from a bed to a chair (including a wheelchair)?: Total Help needed standing up from a chair using your arms (e.g., wheelchair or bedside chair)?: A Lot Help needed to walk in hospital room?: Total Help needed climbing 3-5 steps with a railing? : Total 6 Click Score: 9    End of Session Equipment Utilized During Treatment: Oxygen Activity Tolerance: Patient tolerated treatment well Patient left: in chair;with call bell/phone within reach;with chair alarm set Nurse Communication: Mobility status;Need for lift equipment PT Visit Diagnosis: Unsteadiness on feet (R26.81);Muscle weakness (generalized) (M62.81);Other symptoms and signs involving the nervous system (R29.898)     Time: 2919-1660 PT Time Calculation (min) (ACUTE ONLY): 37 min  Charges:  $Therapeutic Activity: 23-37 mins                     Earney Navy, PTA Acute Rehabilitation Services Pager: [phone] Office: [phone]     Darliss Cheney 12/01/2019, 2:59 PM

## 2019-12-01 NOTE — H&P (Deleted)
RE:  [name] Date of Birth: 03/13/1946 Date: 1947  To Whom It May Concern:  Please be advised that the above-named patient will require a short-term nursing home stay - anticipated 30 days or less for rehabilitation and strengthening.  The plan is for return home.

## 2019-12-01 NOTE — Progress Notes (Signed)
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 Per ems patient was found face down, but noted to able to give some history of her fall and noted complaints of pain on her left hip and back and denied any LOC at that time. Per EMS patient was found to be unkempt and her room was note to be squalid condition. At the scene her blood pressure as 80/50, sat was 87% she was off her home O2.She was treated with 500 ns en route.  ED Course: In the ED patient was noted to be lethargic and was unable to give history initial and worsen ensured related to change in mental status.   Evaluation was positive for a brain lesion.  Patient was hospitalized for further management. The patient was admitted to the ICU on 4N. She underwent a right craniotomy for resection of brain tumor on 11/28/2019 with Dr. Zada Finders. She has tolerated the procedure well, although she continues to complain of headache.  Consultants  . Neurosurgery Procedures  Right craniotomy for resection of brain tumor (11/28/2019).  Antibiotics   Anti-infectives (From admission, onward)   Start     Dose/Rate Route Frequency Ordered Stop   11/29/19 0200  ceFAZolin (ANCEF) IVPB 2g/100 mL premix     2 g 200 mL/hr over 30 Minutes Intravenous Every 8 hours 11/28/19 2119 11/29/19 1105   11/28/19 1815  ceFAZolin  (ANCEF) IVPB 2g/100 mL premix     2 g 200 mL/hr over 30 Minutes Intravenous  Once 11/28/19 1804 11/28/19 1821   11/28/19 1730  bacitracin 50,000 Units in sodium chloride 0.9 % 500 mL irrigation  Status:  Discontinued       As needed 11/28/19 1731 11/28/19 2033   11/21/19 2000  bictegravir-emtricitabine-tenofovir AF (BIKTARVY) 50-200-25 MG per tablet 1 tablet     1 tablet Oral Daily 11/21/19 1953       Subjective  The patient is sitting up at bedside. She continues to complain of headache.  Objective   Vitals:  Vitals:   12/01/19 0739 12/01/19 1312  BP: 116/64 115/75  Pulse: 81 87  Resp: 18 17  Temp: 98 F (36.7 C) 98.8 F (37.1 C)  SpO2: 96% 92%    Exam:  Constitutional:  . The patient is awake, alert, and oriented x 3. Moderate distress from headache. Respiratory:  . No increased work of breathing. . No wheezes, rales, or rhonchi . No tactile fremitus Cardiovascular:  . Regular rate and rhythm . No murmurs, ectopy, or gallups. . No lateral PMI. No thrills. Abdomen:  . Abdomen is soft, non-tender, non-distended . No hernias, masses, or organomegaly . Normoactive bowel sounds.  Musculoskeletal:  . No cyanosis, clubbing, or edema Skin:  . No rashes, lesions, ulcers . palpation of skin: no induration or nodules Neurologic:  . CN 2-12 intact . Sensation all 4 extremities intact Psychiatric:  . Mental status o Mood, affect appropriate o Orientation to person, place, time  . judgment and insight  appear intact  I have personally reviewed the following:   Today's Data  . Vitals, BMP, CBC  Micro Data  . PCR negative for MRSA, positive for staph aureus  Imaging  . MRI Brain 11/29/2019  Cardiology Data  . Echo 11/22/2019: EF 82-99%, RV systolic function is normal and RV size is normal. PA pressure cannot be assessed. Atrial septum is grossly normal.  Scheduled Meds: . bictegravir-emtricitabine-tenofovir AF  1 tablet Oral Daily  . Chlorhexidine Gluconate Cloth   6 each Topical Daily  . Chlorhexidine Gluconate Cloth  6 each Topical Daily  . docusate sodium  100 mg Oral BID  . fluticasone furoate-vilanterol  1 puff Inhalation Daily  . gabapentin  800 mg Oral TID  . heparin injection (subcutaneous)  5,000 Units Subcutaneous Q8H  . loratadine  10 mg Oral BID  . montelukast  10 mg Oral QHS  . mupirocin ointment  1 application Nasal BID  . polyethylene glycol  17 g Oral BID  . QUEtiapine Fumarate  150 mg Oral QHS  . rOPINIRole  1 mg Oral QHS  . senna  2 tablet Oral QHS  . umeclidinium bromide  1 puff Inhalation Daily  . venlafaxine XR  150 mg Oral Daily  . vitamin C  125 mg Oral Daily   Continuous Infusions: . sodium chloride Stopped (11/29/19 1034)    Active Problems:   Change in mental status   Brain tumor ([place])   Cerebral edema ([facility])   Encephalopathy   HIV disease ([facility])   OSA (obstructive sleep apnea)   Dyslipidemia   History of CVA with residual deficit   LOS: 10 days   A & P  Enlarging Brain lesion with vasogenic edema and Mass effect: S/P craniotomy with excision of brain tumor.  There has been concern for seizure activity.  Based on H&P this issue was discussed with a neurologist who did not recommend steroids or antiepileptic drugs at this time. EEG was performed and did not show any epileptiform activity.  Neurosurgery is following. They recommended staging work-up due to patient's previous history of lung cancer. Patient previously seen by Dr. [name]. CT scan of the chest abdomen pelvis does not show any evidence for either primary tumor or metastatic process.   She was started on IV heparin bridge per pharmacy.  Discontinued at midnight by neurosurgery. Neurosurgery has planned to restart prophylactic dose [facility] tomorrow with restart of full anticoagulation to resume pending MRI findings. The patient has tolerated the surgery well. She does continue to have headache pain that has not been relieved by current pain regimen. I did  increase the dose of her oral percocet. She states that her headache is still present, but a little bit better.  Acute Encephalopathy: Resolved. Seems to be back to her baseline.  Could be secondary to fall, dehydration, brain lesion.  Acute hypoxic/hypercarbic respiratory failure on chronic hypoxic respiratory failure:   Possibly related to noncompliance with home oxygen.  She is on home oxygen at 3 to 4 L/min.  Remained stable on supplemental oxygen.  She is currently saturating 98% on 4L by nasal cannula.  Hx of Lung CA: This involved her left upper lobe.  Appears that she underwent ?SRS and radiation treatment.  No evidence for recurrence on CT scan.  Mild AKI on CKDIII: Resolved with IV hydration.  Hypokalemia: Resolved. Both potassium and magnesium were repleted.    HIV:  Noted. Stable. Continue antiretrovirals.  Fall ,Mechanical: No injuries noted on work-up.-no fx on noted  on work up.  History of COPD /Asthma: Seems to be stable. Continue home medication regimen.  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  This will need to be discontinued for her neurosurgery as discussed above. Discussed with Dr. Leonie Man with stroke service.  He recommends stopping apixaban and bridging with IV heparin about 3 to 4 days prior to surgery. She was being bridged with IV heparin, held at midnight. Neurosurgery has planned to restart prophylactic dose [facility] today with plans to restart of full anticoagulation to resume on 12/03/2019 after review of 6/2/202 MRI Brain findings.   Hx of CVA, nonhemorrhagic 9/20: Sequela of left sided weakness.  Patient on apixaban at home. See above.  History of essential hypertension: Patient's blood pressure were elevated initially.  Now she is noted to be normotensive.  Continue to monitor.  Leave her off of medications for now.    OSA: CPAP qhs   Chronic Tobacco abuse:  Patient continue to smoke daily. Nicotine patch /encourage cessation.  Constipation: Resolved with laxatives.  I have seen and examined this patient myself. I have spent 32 minutes in her evaluation and care.  DVT Prophylaxis: Apixaban at home. Was on IV heparin as a bridge. Neurosurgery has planned to restart prophylactic dose [facility] tomorrow with restart of full anticoagulation to resume on 12/03/2019 after review of 6/2/202 MRI Brain findings.  Code Status: Full code Family Communication: Discussed with the patient. No family at bedside. Disposition Plan:  Status is: Inpatient  Rhett Mutschler, DO [facility] Direct contact: see [email or web address]  7PM-7AM contact night coverage as above 12/01/2019, 2:45 PM  LOS: 8 days

## 2019-12-02 LAB — HIV-1 RNA QUANT-NO REFLEX-BLD
HIV 1 RNA Quant: 200 copies/mL
LOG10 HIV-1 RNA: 2.301 log10copy/mL

## 2019-12-02 NOTE — Progress Notes (Signed)
NEUROSURGERY PROGRESS NOTE  Doing well. Complains of appropriate head soreness on the top of her head. Left hemiparesis still, moving right side well 5/5 Incision CDI  Temp:  [97.5 F (36.4 C)-98.8 F (37.1 C)] 98.1 F (36.7 C) (06/05 0830) Pulse Rate:  [76-90] 79 (06/05 0830) Resp:  [14-18] 14 (06/05 0830) BP: (102-136)/(60-76) 119/76 (06/05 0830) SpO2:  [92 %-100 %] 98 % (06/05 08326)  Plan: 74 year old postop day 4. status post crani for tumor resection. Awaiting SNF placement. Continue therapies  Eleonore Chiquito, NP 12/02/2019 9:42 AM

## 2019-12-02 NOTE — Progress Notes (Signed)
Patient stated she dont want to wear CPAP tonight. Patient on [place] tolerating well.

## 2019-12-02 NOTE — Progress Notes (Signed)
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 Per ems patient was found face down, but noted to able to give some history of her fall and noted complaints of pain on her left hip and back and denied any LOC at that time. Per EMS patient was found to be unkempt and her room was note to be squalid condition. At the scene her blood pressure as 80/50, sat was 87% she was off her home O2.She was treated with 500 ns en route.  ED Course: In the ED patient was noted to be lethargic and was unable to give history initial and worsen ensured related to change in mental status.   Evaluation was positive for a brain lesion.  Patient was hospitalized for further management. The patient was admitted to the ICU on 4N. She underwent a right craniotomy for resection of brain tumor on 11/28/2019 with Dr. Zada Finders. She has tolerated the procedure well, although she continues to complain of headache.  Consultants   Neurosurgery Procedures  Right craniotomy for resection of brain tumor (11/28/2019).  Antibiotics   Anti-infectives (From admission, onward)   Start     Dose/Rate Route Frequency Ordered Stop   11/29/19 0200  ceFAZolin (ANCEF) IVPB 2g/100 mL premix     2 g 200 mL/hr over 30 Minutes Intravenous Every 8 hours 11/28/19 2119 11/29/19 1105   11/28/19 1815  ceFAZolin  (ANCEF) IVPB 2g/100 mL premix     2 g 200 mL/hr over 30 Minutes Intravenous  Once 11/28/19 1804 11/28/19 1821   11/28/19 1730  bacitracin 50,000 Units in sodium chloride 0.9 % 500 mL irrigation  Status:  Discontinued       As needed 11/28/19 1731 11/28/19 2033   11/21/19 2000  bictegravir-emtricitabine-tenofovir AF (BIKTARVY) 50-200-25 MG per tablet 1 tablet     1 tablet Oral Daily 11/21/19 1953       Subjective  The patient is sitting up at bedside. Headache, perse, is improved, but the patient continues to complain of severe pain with touching the incision site on her head.   Objective   Vitals:  Vitals:   12/02/19 0312 12/02/19 0830  BP: 108/61 119/76  Pulse: 76 79  Resp: 14 14  Temp: (!) 97.5 F (36.4 C) 98.1 F (36.7 C)  SpO2: 100% 98%    Exam:  Constitutional:   The patient is awake, alert, and oriented x 3. Mild distress from headache. Respiratory:   No increased work of breathing.  No wheezes, rales, or rhonchi  No tactile fremitus Cardiovascular:   Regular rate and rhythm  No murmurs, ectopy, or gallups.  No lateral PMI. No thrills. Abdomen:   Abdomen is soft, non-tender, non-distended  No hernias, masses, or organomegaly  Normoactive bowel sounds.  Musculoskeletal:   No cyanosis, clubbing, or edema Skin:   No rashes, lesions, ulcers  palpation of skin: no induration or nodules Neurologic:   CN 2-12 intact  Sensation all 4 extremities intact Psychiatric:  Mental status o Mood, affect appropriate o Orientation to person, place, time   judgment and insight appear intact  I have personally reviewed the following:   Today's Data   Vitals, BMP, CBC  Micro Data   PCR negative for MRSA, positive for staph aureus  Imaging   MRI Brain 11/29/2019  Cardiology Data   Echo 11/22/2019: EF 74-08%, RV systolic function is normal and RV size is normal. PA pressure cannot be assessed. Atrial septum is grossly normal.  Scheduled Meds:   bictegravir-emtricitabine-tenofovir AF  1 tablet Oral Daily   Chlorhexidine Gluconate Cloth  6 each Topical Daily   Chlorhexidine Gluconate Cloth  6 each Topical Daily   docusate sodium  100 mg Oral BID   fluticasone furoate-vilanterol  1 puff Inhalation Daily   gabapentin  800 mg Oral TID   heparin injection (subcutaneous)  5,000 Units Subcutaneous Q8H   loratadine  10 mg Oral BID   montelukast  10 mg Oral QHS   mupirocin ointment  1 application Nasal BID   polyethylene glycol  17 g Oral BID   QUEtiapine Fumarate  150 mg Oral QHS   rOPINIRole  1 mg Oral QHS   senna  2 tablet Oral QHS   umeclidinium bromide  1 puff Inhalation Daily   venlafaxine XR  150 mg Oral Daily   vitamin C  125 mg Oral Daily   Continuous Infusions:  sodium chloride Stopped (11/29/19 1034)    Active Problems:   Change in mental status   Brain tumor ([place])   Cerebral edema ([facility])   Encephalopathy   HIV disease ([facility])   OSA (obstructive sleep apnea)   Dyslipidemia   History of CVA with residual deficit   LOS: 11 days   A & P  Enlarging Brain lesion with vasogenic edema and Mass effect: S/P craniotomy with excision of brain tumor.  There has been concern for seizure activity.  Based on H&P this issue was discussed with a neurologist who did not recommend steroids or antiepileptic drugs at this time. EEG was performed and did not show any epileptiform activity.  Neurosurgery is following. They recommended staging work-up due to patient's previous history of lung cancer. Patient previously seen by Dr. [name]. CT scan of the chest abdomen pelvis does not show any evidence for either primary tumor or metastatic process.   She was started on IV heparin bridge per pharmacy.  Discontinued at midnight by neurosurgery. Neurosurgery has planned to restart prophylactic dose [facility] tomorrow with restart of full anticoagulation to resume pending MRI findings. The patient has tolerated the surgery well. She does  continue to have headache pain that has not been relieved by current pain regimen. I did increase the dose of her oral percocet. She states that her headache is still present, but a little bit better. She states that she has severe pain only when she touches her incision site.  Acute Encephalopathy: Resolved. Seems to be back to her baseline.  Could be secondary to fall, dehydration, brain lesion.  Acute hypoxic/hypercarbic respiratory failure on chronic hypoxic respiratory failure:   Possibly related to noncompliance with home oxygen.  She is on home oxygen at 3 to 4 L/min.  Remained stable on supplemental oxygen.  She is currently saturating 98% on 4L by nasal cannula.  Hx of Lung CA: This involved her left upper lobe.  Appears that she underwent ?SRS and radiation treatment.  No evidence for recurrence on CT scan.  Mild AKI on CKDIII: Resolved with  IV hydration.  Hypokalemia: Resolved. Both potassium and magnesium were repleted.    HIV:  Noted. Stable. Continue antiretrovirals.  Fall ,Mechanical: No injuries noted on work-up.-no fx on noted on work up.  History of COPD /Asthma: Seems to be stable. Continue home medication regimen.  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  This will need to be discontinued for her neurosurgery as discussed above. Discussed with Dr. Leonie Man with stroke service.  He recommends stopping apixaban and bridging with IV heparin about 3 to 4 days prior to surgery. She was being bridged with IV heparin, held at midnight. Neurosurgery has planned to restart prophylactic dose [facility] today with plans to restart of full anticoagulation to resume on 12/03/2019 after review of 6/2/202 MRI Brain findings.   Hx of CVA, nonhemorrhagic 9/20: Sequela of left sided weakness.  Patient on apixaban at home. See above.  History of essential hypertension: Patient's blood pressure were  elevated initially.  Now she is noted to be normotensive.  Continue to monitor.  Leave her off of medications for now.    OSA: CPAP qhs   Chronic Tobacco abuse: Patient continue to smoke daily. Nicotine patch /encourage cessation.  Constipation: Resolved with laxatives.  I have seen and examined this patient myself. I have spent 34 minutes in her evaluation and care.  DVT Prophylaxis: Apixaban at home. Was on IV heparin as a bridge. Neurosurgery has planned to restart prophylactic dose [facility] tomorrow with restart of full anticoagulation to resume on 12/03/2019 after review of 6/2/202 MRI Brain findings.  Code Status: Full code Family Communication: Discussed with the patient. No family at bedside. Disposition Plan:  Status is: Inpatient  Ohanna Gassert, DO [facility] Direct contact: see [email or web address]  7PM-7AM contact night coverage as above 12/02/2019, 2:37 PM  LOS: 8 days

## 2019-12-03 MED ORDER — APIXABAN 5 MG PO TABS
5.0000 mg | ORAL_TABLET | Freq: Two times a day (BID) | ORAL | Status: DC
  Administered 2019-12-03 – 2019-12-08 (×10): 5 mg via ORAL
  Filled 2019-12-03 (×10): qty 1

## 2019-12-03 NOTE — Progress Notes (Signed)
Neurosurgery Service Progress Note  Subjective: No acute events overnight, no new complaints this morning  Objective: Vitals:   12/03/19 0358 12/03/19 0756 12/03/19 0758 12/03/19 0852  BP: (!) 141/84   123/74  Pulse: 86   82  Resp: 18   16  Temp: 98 F (36.7 C)   99.1 F (37.3 C)  TempSrc: Oral   Oral  SpO2: 96% 96% 96%   Weight:      Height:       Temp (24hrs), Avg:98.3 F (36.8 C), Min:98 F (36.7 C), Max:99.1 F (37.3 C)  CBC Latest Ref Rng & Units 12/01/2019 11/30/2019 11/29/2019  WBC 4.0 - 10.5 K/uL 7.5 9.3 7.3  Hemoglobin 12.0 - 15.0 g/dL 14.5 14.4 14.5  Hematocrit 36.0 - 46.0 % 48.1(H) 47.8(H) 49.4(H)  Platelets 150 - 400 K/uL 234 202 192   BMP Latest Ref Rng & Units 12/01/2019 11/29/2019 11/28/2019  Glucose 70 - 99 mg/dL 105(H) 112(H) 106(H)  BUN 8 - 23 mg/dL 19 18 21   Creatinine 0.44 - 1.00 mg/dL 0.82 0.93 0.91  BUN/Creat Ratio 6 - 22 (calc) - - -  Sodium 135 - 145 mmol/L 138 137 137  Potassium 3.5 - 5.1 mmol/L 4.1 4.8 4.8  Chloride 98 - 111 mmol/L 97(L) 101 98  CO2 22 - 32 mmol/L 30 28 30   Calcium 8.9 - 10.3 mg/dL 9.5 8.8(L) 9.3    Intake/Output Summary (Last 24 hours) at 12/03/2019 0949 Last data filed at 12/03/2019 0359 Gross per 24 hour  Intake 360 ml  Output 1200 ml  Net -840 ml    Current Facility-Administered Medications:    0.9 %  sodium chloride infusion, , Intravenous, PRN, [name], MD, Stopped at 11/29/19 1034   acetaminophen (TYLENOL) tablet 650 mg, 650 mg, Oral, Q6H PRN, 650 mg at 12/03/19 0041 **OR** acetaminophen (TYLENOL) suppository 650 mg, 650 mg, Rectal, Q6H PRN, Myles Rosenthal A, MD   albuterol (PROVENTIL) (2.5 MG/3ML) 0.083% nebulizer solution 2.5 mg, 2.5 mg, Nebulization, Q4H PRN, Clance Boll, MD   bictegravir-emtricitabine-tenofovir AF (BIKTARVY) 50-200-25 MG per tablet 1 tablet, 1 tablet, Oral, Daily, Myles Rosenthal A, MD, 1 tablet at 12/02/19 5701   Chlorhexidine Gluconate Cloth 2 % PADS 6 each, 6 each, Topical,  Daily, [name], MD, 6 each at 12/02/19 1111   Chlorhexidine Gluconate Cloth 2 % PADS 6 each, 6 each, Topical, Daily, Emannuel Vise, Joyice Faster, MD, 6 each at 12/01/19 0945   diclofenac Sodium (VOLTAREN) 1 % topical gel 4 g, 4 g, Topical, BID PRN, Myles Rosenthal A, MD   docusate sodium (COLACE) capsule 100 mg, 100 mg, Oral, BID, Sherryl Valido A, MD, 100 mg at 12/02/19 2252   fluticasone furoate-vilanterol (BREO ELLIPTA) 200-25 MCG/INH 1 puff, 1 puff, Inhalation, Daily, Desirie Minteer, Joyice Faster, MD, 1 puff at 12/03/19 0755   gabapentin (NEURONTIN) capsule 800 mg, 800 mg, Oral, TID, [name], MD, 800 mg at 12/02/19 2253   heparin injection 5,000 Units, 5,000 Units, Subcutaneous, Q8H, Lynasia Meloche, Joyice Faster, MD, 5,000 Units at 12/03/19 0604   HYDROcodone-acetaminophen (NORCO/VICODIN) 5-325 MG per tablet 1 tablet, 1 tablet, Oral, Q4H PRN, [name], MD, 1 tablet at 12/02/19 2259   HYDROmorphone (DILAUDID) injection 0.5 mg, 0.5 mg, Intravenous, Q3H PRN, [name], MD, 0.5 mg at 11/30/19 2016   labetalol (NORMODYNE) injection 10-40 mg, 10-40 mg, Intravenous, Q10 min PRN, [name], MD   loratadine (CLARITIN) tablet 10 mg, 10 mg, Oral, BID, Kamila Broda, Joyice Faster, MD,  10 mg at 12/02/19 2251   LORazepam (ATIVAN) injection 1-2 mg, 1-2 mg, Intravenous, Q2H PRN, [name], MD   montelukast (SINGULAIR) tablet 10 mg, 10 mg, Oral, QHS, Mckaylee Dimalanta, Joyice Faster, MD, 10 mg at 12/02/19 2250   mupirocin ointment (BACTROBAN) 2 % 1 application, 1 application, Nasal, BID, Krrish Freund, Joyice Faster, MD, 1 application at 81/27/51 2255   ondansetron (ZOFRAN) tablet 4 mg, 4 mg, Oral, Q4H PRN **OR** ondansetron (ZOFRAN) injection 4 mg, 4 mg, Intravenous, Q4H PRN, [name], MD, 4 mg at 11/28/19 2138   polyethylene glycol (MIRALAX / GLYCOLAX) packet 17 g, 17 g, Oral, BID, Charnette Younkin, Joyice Faster, MD, 17 g at 12/02/19 2255   polyethylene glycol (MIRALAX / GLYCOLAX)  packet 17 g, 17 g, Oral, Daily PRN, [name], MD, 17 g at 11/30/19 7001   promethazine (PHENERGAN) tablet 12.5-25 mg, 12.5-25 mg, Oral, Q4H PRN, [name], MD   QUEtiapine (SEROQUEL XR) 24 hr tablet 150 mg, 150 mg, Oral, QHS, Kensey Luepke, Joyice Faster, MD, 150 mg at 12/02/19 2251   rOPINIRole (REQUIP) tablet 1 mg, 1 mg, Oral, QHS, Myles Rosenthal A, MD, 1 mg at 12/02/19 2251   senna (SENOKOT) tablet 17.2 mg, 2 tablet, Oral, QHS, Bonnielee Haff, MD, 17.2 mg at 12/02/19 2250   sodium phosphate (FLEET) 7-19 GM/118ML enema 1 enema, 1 enema, Rectal, Daily PRN, Bonnielee Haff, MD   umeclidinium bromide (INCRUSE ELLIPTA) 62.5 MCG/INH 1 puff, 1 puff, Inhalation, Daily, Josephyne Tarter, Joyice Faster, MD, 1 puff at 12/03/19 0755   venlafaxine XR (EFFEXOR-XR) 24 hr capsule 150 mg, 150 mg, Oral, Daily, [name], MD, 150 mg at 12/02/19 7494   vitamin C (ASCORBIC ACID) tablet 125 mg, 125 mg, Oral, Daily, [name], MD, 125 mg at 12/02/19 4967   Physical Exam: AOx3, FCx4 with left sided neglect, when she engages her strength is LUE 4-/5, 3/5 in LLE, 5/5 on R but very difficult to get her to use L side Incision c/d/i  Assessment & Plan: 74 y.o. 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 MRI brain w/ R convexity 3.5x2.4cm enhancing mass concerning for metastasis versus rapidly growing meningioma. CT CAP without evidence of primary. 5/27 vCTH done, 6/1 s/p craniotomy for tumor resection, 6/2 MRI with GTR  -okay to restart full dose anticoagulation -SNF pending  [name]  12/03/19 9:49 AM

## 2019-12-03 NOTE — Progress Notes (Signed)
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 Per ems patient was found face down, but noted to able to give some history of her fall and noted complaints of pain on her left hip and back and denied any LOC at that time. Per EMS patient was found to be unkempt and her room was note to be squalid condition. At the scene her blood pressure as 80/50, sat was 87% she was off her home O2.She was treated with 500 ns en route.  ED Course: In the ED patient was noted to be lethargic and was unable to give history initial and worsen ensured related to change in mental status.   Evaluation was positive for a brain lesion.  Patient was hospitalized for further management. The patient was admitted to the ICU on 4N. She underwent a right craniotomy for resection of brain tumor on 11/28/2019 with Dr. Zada Finders. She has tolerated the procedure well, although she continues to complain of headache.  Plan is for discharge to SNF. Awaiting placement.  Consultants   Hospitalist Procedures  Right craniotomy for resection of brain tumor (11/28/2019).  Antibiotics   Anti-infectives (From admission, onward)   Start     Dose/Rate Route Frequency Ordered Stop   11/29/19 0200  ceFAZolin (ANCEF) IVPB 2g/100 mL premix     2 g 200 mL/hr over 30 Minutes Intravenous Every 8 hours  11/28/19 2119 11/29/19 1105   11/28/19 1815  ceFAZolin (ANCEF) IVPB 2g/100 mL premix     2 g 200 mL/hr over 30 Minutes Intravenous  Once 11/28/19 1804 11/28/19 1821   11/28/19 1730  bacitracin 50,000 Units in sodium chloride 0.9 % 500 mL irrigation  Status:  Discontinued       As needed 11/28/19 1731 11/28/19 2033   11/21/19 2000  bictegravir-emtricitabine-tenofovir AF (BIKTARVY) 50-200-25 MG per tablet 1 tablet     1 tablet Oral Daily 11/21/19 1953       Subjective  The patient is sitting up at bedside. No new complaints.  Objective   Vitals:  Vitals:   12/03/19 0852 12/03/19 1206  BP: 123/74 119/74  Pulse: 82 85  Resp: 16 (!) 21  Temp: 99.1 F (37.3 C) 98.1 F (36.7 C)  SpO2:      Exam:  Constitutional:   The patient is awake, alert, and oriented x 3. No acute distress. Respiratory:   No increased work of breathing.  No wheezes, rales, or rhonchi  No tactile fremitus Cardiovascular:   Regular rate and rhythm  No murmurs, ectopy, or gallups.  No lateral PMI. No thrills. Abdomen:   Abdomen is soft, non-tender, non-distended  No hernias, masses, or organomegaly  Normoactive bowel sounds.  Musculoskeletal:   No cyanosis, clubbing, or edema Skin:   No rashes, lesions, ulcers  palpation of skin: no induration or nodules Neurologic:   CN 2-12 intact  Sensation all 4 extremities intact Psychiatric:   Mental status o Mood, affect appropriate o Orientation to person, place,  time   judgment and insight appear intact  I have personally reviewed the following:   Today's Data   Vitals, HIV 1 Quantitative RNA  Log 10 HIV-1 RNA  Micro Data   PCR negative for MRSA, positive for staph aureus  Imaging   MRI Brain 11/29/2019  Cardiology Data   Echo 11/22/2019: EF 84-66%, RV systolic function is normal and RV size is normal. PA pressure cannot be assessed. Atrial septum is grossly normal.  Scheduled Meds:  apixaban  5 mg Oral BID    bictegravir-emtricitabine-tenofovir AF  1 tablet Oral Daily   Chlorhexidine Gluconate Cloth  6 each Topical Daily   Chlorhexidine Gluconate Cloth  6 each Topical Daily   docusate sodium  100 mg Oral BID   fluticasone furoate-vilanterol  1 puff Inhalation Daily   gabapentin  800 mg Oral TID   heparin injection (subcutaneous)  5,000 Units Subcutaneous Q8H   loratadine  10 mg Oral BID   montelukast  10 mg Oral QHS   mupirocin ointment  1 application Nasal BID   polyethylene glycol  17 g Oral BID   QUEtiapine Fumarate  150 mg Oral QHS   rOPINIRole  1 mg Oral QHS   senna  2 tablet Oral QHS   umeclidinium bromide  1 puff Inhalation Daily   venlafaxine XR  150 mg Oral Daily   vitamin C  125 mg Oral Daily   Continuous Infusions:  sodium chloride Stopped (11/29/19 1034)    Active Problems:   Change in mental status   Brain tumor ([place])   Cerebral edema ([facility])   Encephalopathy   HIV disease ([facility])   OSA (obstructive sleep apnea)   Dyslipidemia   History of CVA with residual deficit   LOS: 12 days   A & P  Enlarging Brain lesion with vasogenic edema and Mass effect: S/P craniotomy with excision of brain tumor.  There has been concern for seizure activity.  Based on H&P this issue was discussed with a neurologist who did not recommend steroids or antiepileptic drugs at this time. EEG was performed and did not show any epileptiform activity.  Neurosurgery is following. They recommended staging work-up due to patient's previous history of lung cancer. Patient previously seen by Dr. [name]. CT scan of the chest abdomen pelvis does not show any evidence for either primary tumor or metastatic process.   She was started on IV heparin bridge per pharmacy.  Discontinued at midnight by neurosurgery. Neurosurgery has planned to restart prophylactic dose [facility] tomorrow with restart of full anticoagulation to resume pending MRI findings. The patient has tolerated the surgery well. She does  continue to have headache pain that has not been relieved by current pain regimen. I did increase the dose of her oral percocet. She states that her headache is still present, but a little bit better. She states that she has severe pain only when she touches her incision site.  Acute Encephalopathy: Resolved. Seems to be back to her baseline. Could be secondary to fall, dehydration, brain lesion.  Acute hypoxic/hypercarbic respiratory failure on chronic hypoxic respiratory failure: Possibly related to non-compliance with home oxygen. She is on home oxygen at 3 to 4 L/min. Remained stable on supplemental oxygen. She is currently saturating 90% on 4L by nasal cannula.  Hx of Lung CA: This involved her left upper lobe.  Appears that she underwent SRS and radiation treatment.  No evidence for recurrence on CT scan.  Mild AKI on CKDIII: Resolved with IV hydration.  Hypokalemia: Resolved. Both potassium and magnesium were repleted.    HIV:  Noted. Stable. Continue antiretrovirals.  Fall ,Mechanical: No injuries noted on work-up.-no fx on noted on work up.  History of COPD /Asthma: Seems to be stable. Continue home medication regimen.  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  This will need to be discontinued for her neurosurgery as discussed above. Discussed with Dr. Leonie Man with stroke service.  He recommends stopping apixaban and bridging with IV heparin about 3 to 4 days prior to surgery. She was being bridged with IV heparin, held at midnight.. Eliquis has been restarted as per neurosurgery's guidance.  Hx of CVA, nonhemorrhagic 9/20: Sequela of left sided weakness.  Patient on apixaban at home. Now restarted.  History of essential hypertension: Patient's blood pressure were elevated initially.  Now she is noted to be normotensive.  Continue to monitor.  Leave her off of medications for now.     OSA: CPAP qhs   Chronic Tobacco abuse: Patient continue to smoke daily. Nicotine patch /encourage cessation.  Constipation: Resolved with laxatives.  I have seen and examined this patient myself. I have spent 32 minutes in her evaluation and care.  DVT Prophylaxis: Eliquis Code Status: Full code Family Communication: Discussed with the patient. No family at bedside. Disposition Plan:  Status is: Inpatient  Lucius Wise, DO [facility] Direct contact: see [email or web address]  7PM-7AM contact night coverage as above 12/03/2019, 3:06 PM  LOS: 8 days

## 2019-12-03 NOTE — Progress Notes (Signed)
Patient transferred to room [phone]. All belongings transferred as well. Family notified. Patient equipment transferred as well.

## 2019-12-04 NOTE — Progress Notes (Signed)
Physical Therapy Treatment Patient Details Name: [name] MRN: [id number] DOB: 03-20-1946 Today's Date: 12/04/2019    History of Present Illness 74yo F admitted with multiple falls prior and found to have enlarging brain lesion with vasogenic edema with mass effect. 6/1 brain mass removed with R craniotomy by Dr Christinia Gully  [facility] s/p fall with R MCA hemorrhagic R basal ganglia infarct HIV, adenocarcinoma of LUL s/p radiation therapy, HTN, HLD, COPD, chronic respiratory failure on 4 L/min oxygen at baseline, asthma, HBV, tobacco abuse, CKD III.    PT Comments    Pt in bed upon arrival of PT, agreeable to PT session with focus on progressing OOB mobility. The pt is eager to participate, but demos poor safety awareness and insight to deficits throughout session, frequently requiring cues to correct posture or regain balance in both seated and standing positions. The pt continues to present with significant limitations in functional strength, motor control, planning, and stability which impact her ability to complete transfers and mobility without significant assist. The pt continues to require assist of 2 to complete safe transfers and OOB mobility, and therefore will continue to benefit from skilled PT to maximize return to prior level of independence.    Follow Up Recommendations  CIR     Equipment Recommendations  Wheelchair (measurements PT);Wheelchair cushion (measurements PT);Hospital bed(mechanical lift)    Recommendations for Other Services       Precautions / Restrictions Precautions Precautions: Fall Precaution Comments: L hemi Restrictions Weight Bearing Restrictions: No    Mobility  Bed Mobility Overal bed mobility: Needs Assistance Bed Mobility: Supine to Sit     Supine to sit: Mod assist;HOB elevated     General bed mobility comments: assist to raise trunk from [facility] and mantain balance in sitting. pt with strong posterior lean depsite UE support. minA to scoot hips  to EOB  Transfers Overall transfer level: Needs assistance Equipment used: Ambulation equipment used Transfers: Sit to/from [facility] Sit to Stand: +2 physical assistance;Mod assist;From elevated surface;+2 safety/equipment Stand pivot transfers: Total assist;+2 safety/equipment;+2 physical assistance       General transfer comment: assist for L UE placement and grip, mod/maxA to maintain upright with strong L  and posterior lean despite cues. assist to power up and facilitate hip extension. totalA in stedy to pivot  Ambulation/Gait Ambulation/Gait assistance: (pt unable)               Stairs             Wheelchair Mobility    Modified Rankin (Stroke Patients Only)       Balance Overall balance assessment: Needs assistance Sitting-balance support: Feet supported;Single extremity supported Sitting balance-Leahy Scale: Poor Sitting balance - Comments: pt requires assistance to maintain sitting balance EOB even when holding bed rail Postural control: Left lateral lean;Posterior lean Standing balance support: Bilateral upper extremity supported;During functional activity Standing balance-Leahy Scale: Zero Standing balance comment: used stedy with L sided support due to hemiparesis. Frequent cues to obtain midline                            Cognition Arousal/Alertness: Awake/alert Behavior During Therapy: WFL for tasks assessed/performed Overall Cognitive Status: Impaired/Different from baseline Area of Impairment: Attention;Memory;Following commands;Safety/judgement;Awareness;Problem solving                   Current Attention Level: Focused Memory: Decreased short-term memory Following Commands: Follows one step commands consistently Safety/Judgement: Decreased awareness  of deficits;Decreased awareness of safety Awareness: Intellectual Problem Solving: Requires verbal cues;Requires tactile cues;Slow processing;Decreased  initiation General Comments: Pt with poor insight to safety and deficits. benefits from constant cues for posture and cues on how to correct before pt makes any attempt to correct      Exercises      General Comments General comments (skin integrity, edema, etc.): VSS on 4L [place]      Pertinent Vitals/Pain Pain Assessment: No/denies pain Pain Intervention(s): Monitored during session;Repositioned    Home Living                      Prior Function            PT Goals (current goals can now be found in the care plan section) Acute Rehab PT Goals Patient Stated Goal: get better and get home PT Goal Formulation: With patient Time For Goal Achievement: 12/13/19 Potential to Achieve Goals: Good Progress towards PT goals: Progressing toward goals    Frequency    Min 4X/week      PT Plan Current plan remains appropriate    Co-evaluation              AM-PAC PT "6 Clicks" Mobility   Outcome Measure  Help needed turning from your back to your side while in a flat bed without using bedrails?: A Lot Help needed moving from lying on your back to sitting on the side of a flat bed without using bedrails?: A Lot Help needed moving to and from a bed to a chair (including a wheelchair)?: Total Help needed standing up from a chair using your arms (e.g., wheelchair or bedside chair)?: A Lot Help needed to walk in hospital room?: Total Help needed climbing 3-5 steps with a railing? : Total 6 Click Score: 9    End of Session Equipment Utilized During Treatment: Oxygen;Gait belt Activity Tolerance: Patient tolerated treatment well Patient left: in chair;with call bell/phone within reach;with chair alarm set Nurse Communication: Mobility status;Need for lift equipment PT Visit Diagnosis: Unsteadiness on feet (R26.81);Muscle weakness (generalized) (M62.81);Other symptoms and signs involving the nervous system (S17.793)     Time: 9030-0923 PT Time Calculation (min)  (ACUTE ONLY): 24 min  Charges:  $Therapeutic Activity: 23-37 mins                     Karma Ganja, PT, DPT   Acute Rehabilitation Department Pager #: [phone]   Otho Bellows 12/04/2019, 4:53 PM

## 2019-12-04 NOTE — Progress Notes (Signed)
Neurosurgery Service Progress Note  Subjective: No acute events overnight, no new complaints today  Objective: Vitals:   12/04/19 0714 12/04/19 0733 12/04/19 1215 12/04/19 1514  BP: 126/76  119/76 111/75  Pulse: 77  84 78  Resp: 14  15 15   Temp: 98.2 F (36.8 C)  98.5 F (36.9 C) 99.3 F (37.4 C)  TempSrc: Oral  Oral Oral  SpO2:  95% 94% 95%  Weight:      Height:       Temp (24hrs), Avg:98.6 F (37 C), Min:98 F (36.7 C), Max:99.3 F (37.4 C)  CBC Latest Ref Rng & Units 12/01/2019 11/30/2019 11/29/2019  WBC 4.0 - 10.5 K/uL 7.5 9.3 7.3  Hemoglobin 12.0 - 15.0 g/dL 14.5 14.4 14.5  Hematocrit 36.0 - 46.0 % 48.1(H) 47.8(H) 49.4(H)  Platelets 150 - 400 K/uL 234 202 192   BMP Latest Ref Rng & Units 12/01/2019 11/29/2019 11/28/2019  Glucose 70 - 99 mg/dL 105(H) 112(H) 106(H)  BUN 8 - 23 mg/dL 19 18 21   Creatinine 0.44 - 1.00 mg/dL 0.82 0.93 0.91  BUN/Creat Ratio 6 - 22 (calc) - - -  Sodium 135 - 145 mmol/L 138 137 137  Potassium 3.5 - 5.1 mmol/L 4.1 4.8 4.8  Chloride 98 - 111 mmol/L 97(L) 101 98  CO2 22 - 32 mmol/L 30 28 30   Calcium 8.9 - 10.3 mg/dL 9.5 8.8(L) 9.3    Intake/Output Summary (Last 24 hours) at 12/04/2019 1900 Last data filed at 12/04/2019 1557 Gross per 24 hour  Intake 177 ml  Output 650 ml  Net -473 ml    Current Facility-Administered Medications:  .  0.9 %  sodium chloride infusion, , Intravenous, PRN, [name], MD, Stopped at 11/29/19 1034 .  acetaminophen (TYLENOL) tablet 650 mg, 650 mg, Oral, Q6H PRN, 650 mg at 12/03/19 0041 **OR** acetaminophen (TYLENOL) suppository 650 mg, 650 mg, Rectal, Q6H PRN, Myles Rosenthal A, MD .  albuterol (PROVENTIL) (2.5 MG/3ML) 0.083% nebulizer solution 2.5 mg, 2.5 mg, Nebulization, Q4H PRN, Myles Rosenthal A, MD .  apixaban (ELIQUIS) tablet 5 mg, 5 mg, Oral, BID, Swayze, Ava, DO, 5 mg at 12/04/19 0906 .  bictegravir-emtricitabine-tenofovir AF (BIKTARVY) 50-200-25 MG per tablet 1 tablet, 1 tablet, Oral, Daily, Clance Boll, MD, 1 tablet at 12/04/19 0906 .  Chlorhexidine Gluconate Cloth 2 % PADS 6 each, 6 each, Topical, Daily, [name], MD, 6 each at 12/02/19 1111 .  diclofenac Sodium (VOLTAREN) 1 % topical gel 4 g, 4 g, Topical, BID PRN, Myles Rosenthal A, MD .  docusate sodium (COLACE) capsule 100 mg, 100 mg, Oral, BID, [name], MD, 100 mg at 12/04/19 0906 .  fluticasone furoate-vilanterol (BREO ELLIPTA) 200-25 MCG/INH 1 puff, 1 puff, Inhalation, Daily, [name], MD, 1 puff at 12/04/19 0733 .  gabapentin (NEURONTIN) capsule 800 mg, 800 mg, Oral, TID, [name], MD, 800 mg at 12/04/19 1526 .  HYDROcodone-acetaminophen (NORCO/VICODIN) 5-325 MG per tablet 1 tablet, 1 tablet, Oral, Q4H PRN, [name], MD, 1 tablet at 12/04/19 1631 .  HYDROmorphone (DILAUDID) injection 0.5 mg, 0.5 mg, Intravenous, Q3H PRN, [name], MD, 0.5 mg at 12/03/19 1943 .  labetalol (NORMODYNE) injection 10-40 mg, 10-40 mg, Intravenous, Q10 min PRN, [name], MD .  loratadine (CLARITIN) tablet 10 mg, 10 mg, Oral, BID, [name], MD, 10 mg at 12/04/19 0906 .  LORazepam (ATIVAN) injection 1-2 mg, 1-2 mg, Intravenous, Q2H PRN, [name], MD .  montelukast (SINGULAIR) tablet 10 mg, 10 mg, Oral, QHS, Amsi Grimley, Joyice Faster, MD, 10 mg at 12/03/19 2302 .  ondansetron (ZOFRAN) tablet 4 mg, 4 mg, Oral, Q4H PRN **OR** ondansetron (ZOFRAN) injection 4 mg, 4 mg, Intravenous, Q4H PRN, [name], MD, 4 mg at 11/28/19 2138 .  polyethylene glycol (MIRALAX / GLYCOLAX) packet 17 g, 17 g, Oral, BID, [name], MD, 17 g at 12/04/19 0906 .  polyethylene glycol (MIRALAX / GLYCOLAX) packet 17 g, 17 g, Oral, Daily PRN, [name], MD, 17 g at 11/30/19 0240 .  promethazine (PHENERGAN) tablet 12.5-25 mg, 12.5-25 mg, Oral, Q4H PRN, [name], MD .  QUEtiapine (SEROQUEL XR) 24 hr tablet 150 mg, 150 mg, Oral, QHS, Alic Hilburn, Joyice Faster,  MD, 150 mg at 12/03/19 2302 .  rOPINIRole (REQUIP) tablet 1 mg, 1 mg, Oral, QHS, Myles Rosenthal A, MD, 1 mg at 12/03/19 2302 .  senna (SENOKOT) tablet 17.2 mg, 2 tablet, Oral, QHS, Bonnielee Haff, MD, 17.2 mg at 12/03/19 2302 .  sodium phosphate (FLEET) 7-19 GM/118ML enema 1 enema, 1 enema, Rectal, Daily PRN, Bonnielee Haff, MD .  umeclidinium bromide (INCRUSE ELLIPTA) 62.5 MCG/INH 1 puff, 1 puff, Inhalation, Daily, [name], MD, 1 puff at 12/04/19 0733 .  venlafaxine XR (EFFEXOR-XR) 24 hr capsule 150 mg, 150 mg, Oral, Daily, [name], MD, 150 mg at 12/04/19 0906 .  vitamin C (ASCORBIC ACID) tablet 125 mg, 125 mg, Oral, Daily, [name], MD, 125 mg at 12/04/19 9735   Physical Exam: AOx3, FCx4 with left sided neglect, difficult to get her to move the L side today Incision c/d/i  Assessment & Plan: 74 y.o. 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 MRI brain w/ R convexity 3.5x2.4cm enhancing mass concerning for metastasis versus rapidly growing meningioma. CT CAP without evidence of primary. 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 I discussed this with the patient today. She still perseverates about possibly having bed bugs instead of a tumor, which is what she was also saying preop. I made it clear that it was a metastasis and, given her history, possibly(but not definitely) from lung cancer. Will reach out to oncology for further recs, already discussed at neuro tumor board to set up for post-op SRS to her cranial resection cavity.  Marcello Moores A Tenya Araque  12/04/19 7:00 PM

## 2019-12-04 NOTE — Progress Notes (Signed)
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 Per ems patient was found face down, but noted to able to give some history of her fall and noted complaints of pain on her left hip and back and denied any LOC at that time. Per EMS patient was found to be unkempt and her room was note to be squalid condition. At the scene her blood pressure as 80/50, sat was 87% she was off her home O2.She was treated with 500 ns en route.  ED Course: In the ED patient was noted to be lethargic and was unable to give history initial and worsen ensured related to change in mental status.   Evaluation was positive for a brain lesion.  Patient was hospitalized for further management. The patient was admitted to the ICU on 4N. She underwent a right craniotomy for resection of brain tumor on 11/28/2019 with Dr. Zada Finders. She has tolerated the procedure well, although she continues to complain of headache.  Plan is for discharge to SNF. Awaiting placement.  Consultants  . Hospitalist Procedures  Right craniotomy for resection of brain tumor (11/28/2019).  Antibiotics   Anti-infectives (From admission, onward)   Start     Dose/Rate Route Frequency Ordered Stop   11/29/19 0200  ceFAZolin (ANCEF) IVPB 2g/100 mL premix     2 g 200 mL/hr over 30 Minutes Intravenous Every 8 hours  11/28/19 2119 11/29/19 1105   11/28/19 1815  ceFAZolin (ANCEF) IVPB 2g/100 mL premix     2 g 200 mL/hr over 30 Minutes Intravenous  Once 11/28/19 1804 11/28/19 1821   11/28/19 1730  bacitracin 50,000 Units in sodium chloride 0.9 % 500 mL irrigation  Status:  Discontinued       As needed 11/28/19 1731 11/28/19 2033   11/21/19 2000  bictegravir-emtricitabine-tenofovir AF (BIKTARVY) 50-200-25 MG per tablet 1 tablet     1 tablet Oral Daily 11/21/19 1953       Subjective  The patient is sitting up at bedside. No new complaints.  Objective   Vitals:  Vitals:   12/04/19 0733 12/04/19 1215  BP:  119/76  Pulse:  84  Resp:  15  Temp:  98.5 F (36.9 C)  SpO2: 95% 94%    Exam:  Constitutional:  . The patient is awake, alert, and oriented x 3. No acute distress. Respiratory:  . No increased work of breathing. . No wheezes, rales, or rhonchi . No tactile fremitus Cardiovascular:  . Regular rate and rhythm . No murmurs, ectopy, or gallups. . No lateral PMI. No thrills. Abdomen:  . Abdomen is soft, non-tender, non-distended . No hernias, masses, or organomegaly . Normoactive bowel sounds.  Musculoskeletal:  . No cyanosis, clubbing, or edema Skin:  . No rashes, lesions, ulcers . palpation of skin: no induration or nodules Neurologic:  . CN 2-12 intact . Sensation all 4 extremities intact Psychiatric:  . Mental status o Mood, affect appropriate o Orientation to person, place, time  . judgment  and insight appear intact  I have personally reviewed the following:   Today's Data  . Vitals, HIV 1 Quantitative RNA . Log 10 HIV-1 RNA  Micro Data  . PCR negative for MRSA, positive for staph aureus  Imaging  . MRI Brain 11/29/2019  Cardiology Data  . Echo 11/22/2019: EF 72-09%, RV systolic function is normal and RV size is normal. PA pressure cannot be assessed. Atrial septum is grossly normal.  Scheduled Meds: . apixaban  5 mg Oral BID  .  bictegravir-emtricitabine-tenofovir AF  1 tablet Oral Daily  . Chlorhexidine Gluconate Cloth  6 each Topical Daily  . docusate sodium  100 mg Oral BID  . fluticasone furoate-vilanterol  1 puff Inhalation Daily  . gabapentin  800 mg Oral TID  . loratadine  10 mg Oral BID  . montelukast  10 mg Oral QHS  . polyethylene glycol  17 g Oral BID  . QUEtiapine Fumarate  150 mg Oral QHS  . rOPINIRole  1 mg Oral QHS  . senna  2 tablet Oral QHS  . umeclidinium bromide  1 puff Inhalation Daily  . venlafaxine XR  150 mg Oral Daily  . vitamin C  125 mg Oral Daily   Continuous Infusions: . sodium chloride Stopped (11/29/19 1034)    Active Problems:   Change in mental status   Brain tumor ([place])   Cerebral edema ([facility])   Encephalopathy   HIV disease ([facility])   OSA (obstructive sleep apnea)   Dyslipidemia   History of CVA with residual deficit   LOS: 13 days   A & P  Enlarging Brain lesion with vasogenic edema and Mass effect: S/P craniotomy with excision of brain tumor.  There has been concern for seizure activity.  Based on H&P this issue was discussed with a neurologist who did not recommend steroids or antiepileptic drugs at this time. EEG was performed and did not show any epileptiform activity.  Neurosurgery is following. They recommended staging work-up due to patient's previous history of lung cancer. Patient previously seen by Dr. [name]. CT scan of the chest abdomen pelvis does not show any evidence for either primary tumor or metastatic process. The patient has tolerated the surgery well. She does continue to have headache pain that has not been relieved by current pain regimen. I did increase the dose of her oral percocet. She states that her headache is still present, but a little bit better. She states that she has severe pain only when she touches her incision site. The patient has been cleared to restart eliquis per neurosurgery.  Acute Encephalopathy: Resolved. Seems to be back to her  baseline. Could be secondary to fall, dehydration, brain lesion.  Acute hypoxic/hypercarbic respiratory failure on chronic hypoxic respiratory failure: Possibly related to non-compliance with home oxygen. She is on home oxygen at 3 to 4 L/min. Remained stable on supplemental oxygen. She is currently saturating 90% on 4L by nasal cannula.  Hx of Lung CA: This involved her left upper lobe.  Appears that she underwent SRS and radiation treatment.  No evidence for recurrence on CT scan.  Mild AKI on CKDIII: Resolved with IV hydration.  Hypokalemia: Resolved. Both potassium and magnesium were repleted.    HIV:  Noted. Stable. Continue antiretrovirals.  Fall ,Mechanical: No injuries noted on work-up.-no fx on noted on work up.  History of COPD /Asthma: Seems to be stable. Continue home medication regimen.  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  This will need to be discontinued for her neurosurgery as discussed above. Discussed with Dr. Leonie Man with stroke service.  He recommends stopping apixaban and bridging with IV heparin about 3 to 4 days prior to surgery. She was being bridged with IV heparin, held at midnight.. Eliquis has been restarted as per neurosurgery's guidance.  Hx of CVA, nonhemorrhagic 9/20: Sequela of left sided weakness.  Patient on apixaban at home. Now restarted.  History of essential hypertension: Patient's blood pressure were elevated initially.  Now she is noted to be normotensive.  Continue to monitor.  Leave her off of medications for now.    OSA: CPAP qhs   Chronic Tobacco abuse: Patient continue to smoke daily. Nicotine patch /encourage cessation.  Constipation: Resolved with laxatives.  I have seen and examined this patient myself. I have spent 30 minutes in her evaluation and care.  DVT Prophylaxis: Eliquis Code Status: Full code Family Communication:  Discussed with the patient. No family at bedside. Disposition Plan:  Status is: Inpatient  Roselani Grajeda, DO [facility] Direct contact: see [email or web address]  7PM-7AM contact night coverage as above 12/03/2019, 3:06 PM  LOS: 8 days

## 2019-12-05 LAB — CBC WITH DIFFERENTIAL/PLATELET
Abs Immature Granulocytes: 0.02 10*3/uL (ref 0.00–0.07)
Basophils Absolute: 0.1 10*3/uL (ref 0.0–0.1)
Basophils Relative: 1 %
Eosinophils Absolute: 0.2 10*3/uL (ref 0.0–0.5)
Eosinophils Relative: 4 %
HCT: 45.7 % (ref 36.0–46.0)
Hemoglobin: 14.1 g/dL (ref 12.0–15.0)
Immature Granulocytes: 0 %
Lymphocytes Relative: 45 %
Lymphs Abs: 3.1 10*3/uL (ref 0.7–4.0)
MCH: 28.1 pg (ref 26.0–34.0)
MCHC: 30.9 g/dL (ref 30.0–36.0)
MCV: 91.2 fL (ref 80.0–100.0)
Monocytes Absolute: 0.7 10*3/uL (ref 0.1–1.0)
Monocytes Relative: 10 %
Neutro Abs: 2.8 10*3/uL (ref 1.7–7.7)
Neutrophils Relative %: 40 %
Platelets: 278 10*3/uL (ref 150–400)
RBC: 5.01 MIL/uL (ref 3.87–5.11)
RDW: 15.1 % (ref 11.5–15.5)
WBC: 6.9 10*3/uL (ref 4.0–10.5)
nRBC: 0 % (ref 0.0–0.2)

## 2019-12-05 LAB — BASIC METABOLIC PANEL
Anion gap: 9 (ref 5–15)
BUN: 26 mg/dL — ABNORMAL HIGH (ref 8–23)
CO2: 28 mmol/L (ref 22–32)
Calcium: 9.3 mg/dL (ref 8.9–10.3)
Chloride: 98 mmol/L (ref 98–111)
Creatinine, Ser: 0.96 mg/dL (ref 0.44–1.00)
GFR calc Af Amer: >60 mL/min (ref >60)
GFR calc non Af Amer: 59 mL/min — ABNORMAL LOW (ref >60)
Glucose, Bld: 103 mg/dL — ABNORMAL HIGH (ref 70–99)
Potassium: 4.5 mmol/L (ref 3.5–5.1)
Sodium: 135 mmol/L (ref 135–145)

## 2019-12-05 NOTE — Progress Notes (Signed)
Occupational Therapy Treatment Patient Details Name: [name] MRN: [id number] DOB: [birth year] Today's Date: 12/05/2019    History of present illness 74yo F admitted with multiple falls prior and found to have enlarging brain lesion with vasogenic edema with mass effect. 6/1 brain mass removed with R craniotomy by Dr Christinia Gully  [facility] s/p fall with R MCA hemorrhagic R basal ganglia infarct HIV, adenocarcinoma of LUL s/p radiation therapy, HTN, HLD, COPD, chronic respiratory failure on 4 L/min oxygen at baseline, asthma, HBV, tobacco abuse, CKD III.   OT comments  Pt seen in conjunction with PT for functional mobility progression.Pt making gradual progress towards OT goals this session with pt needing MAX- total A +2 for all aspects of mobility this session. Pt lethargic during session needing total A to transition to EOB and total A for sitting balance with pt heavily leaning to L side. Pt noted to be soiled therefore pt completed sit<>stand briefly to stedy with total A, returned pt to supine for pericare with total A for all aspects of self care. Noted CIR sign off; updated DC recs to SNF as pt continues to require a significant amount of assist and has limited support at home. Will let OTR know about change in POC. Will continue to follow acutely per POC.    Follow Up Recommendations  SNF    Equipment Recommendations  Other (comment)(TBD at next venue of care)    Recommendations for Other Services      Precautions / Restrictions Precautions Precautions: Fall Precaution Comments: L hemi Restrictions Weight Bearing Restrictions: No       Mobility Bed Mobility Overal bed mobility: Needs Assistance Bed Mobility: Supine to Sit;Rolling;Sit to Sidelying Rolling: Max assist;Mod assist   Supine to sit: HOB elevated;Total assist;+2 for safety/equipment;+2 for physical assistance   Sit to sidelying: Max assist;+2 for physical assistance;+2 for safety/equipment General bed mobility  comments: multimodal cues for sequencing of all bed mobility; use of bed to bring hips to EOB and assist to mobilize bilat LE and elevate trunk into sitting; pt assisted minimally to bring R LE into bed from side lying; mod A to roll toward L side and max A to roll toward R side   Transfers Overall transfer level: Needs assistance Equipment used: Ambulation equipment used Transfers: Sit to/from Stand Sit to Stand: +2 physical assistance;+2 safety/equipment;Max assist;Total assist         General transfer comment: pt with heavy L lateral bias and tendency to push with R UE today; pt stood X 2-3 with +2 assist and +2 assist required to maintain sitting balance on Stedy standing frame; max multimodal cues for midline posture; pt with eyes closed at times and leaning anteriorly without assist/cues to sit upright; difficulty with hip extension especially second tial     Balance Overall balance assessment: Needs assistance Sitting-balance support: Feet supported;Single extremity supported Sitting balance-Leahy Scale: Zero Sitting balance - Comments: multimodal cues and assistance to maintain sitting balance EOB; pt pushing with R UE  Postural control: Posterior lean;Left lateral lean(anterior lean) Standing balance support: Bilateral upper extremity supported;During functional activity Standing balance-Leahy Scale: Zero                             ADL either performed or assessed with clinical judgement   ADL Overall ADL's : Needs assistance/impaired             Lower Body Bathing: Total assistance;Bed level   Upper Body  Dressing : Maximal assistance;Bed level Upper Body Dressing Details (indicate cue type and reason): to don new hospital gown     Toilet Transfer: Maximal assistance;+2 for physical assistance;+2 for safety/equipment;Total assistance Toilet Transfer Details (indicate cue type and reason): used stedy; sit<>stand only to clean pt up         Functional  mobility during ADLs: Maximal assistance;+2 for physical assistance;+2 for safety/equipment;Cueing for safety;Total assistance(stedy) General ADL Comments: pt with decreased level of arousal this session needing MAX- total A for all mobility and ADL participation; heavy L lateral lean     Vision Baseline Vision/History: Legally blind     Perception     Praxis      Cognition Arousal/Alertness: Lethargic Behavior During Therapy: WFL for tasks assessed/performed;Flat affect Overall Cognitive Status: Impaired/Different from baseline Area of Impairment: Attention;Memory;Following commands;Safety/judgement;Awareness;Problem solving                   Current Attention Level: Focused Memory: Decreased short-term memory Following Commands: Follows one step commands inconsistently Safety/Judgement: Decreased awareness of deficits;Decreased awareness of safety Awareness: Intellectual Problem Solving: Requires verbal cues;Requires tactile cues;Slow processing;Decreased initiation;Difficulty sequencing General Comments: pt assisted minimally with mobility and kept eyes closed most of session although speaking to therapist and saying "okay" when given cues however unable to follow single step cues consistently        Exercises     Shoulder Instructions       General Comments pt on 2L [place] with VSS    Pertinent Vitals/ Pain       Pain Assessment: Faces Faces Pain Scale: Hurts little more Pain Location: head Pain Descriptors / Indicators: Headache Pain Intervention(s): Monitored during session  Home Living                                          Prior Functioning/Environment              Frequency  Min 2X/week        Progress Toward Goals  OT Goals(current goals can now be found in the care plan section)  Progress towards OT goals: Progressing toward goals  Acute Rehab OT Goals Patient Stated Goal: get better and get home OT Goal Formulation:  With patient Time For Goal Achievement: 12/13/19 Potential to Achieve Goals: Good  Plan Discharge plan needs to be updated;Frequency needs to be updated    Co-evaluation    PT/OT/SLP Co-Evaluation/Treatment: Yes Reason for Co-Treatment: Complexity of the patient's impairments (multi-system involvement);For patient/therapist safety;To address functional/ADL transfers;Necessary to address cognition/behavior during functional activity PT goals addressed during session: Balance;Mobility/safety with mobility OT goals addressed during session: ADL's and self-care      AM-PAC OT "6 Clicks" Daily Activity     Outcome Measure   Help from another person eating meals?: A Lot Help from another person taking care of personal grooming?: Total Help from another person toileting, which includes using toliet, bedpan, or urinal?: Total Help from another person bathing (including washing, rinsing, drying)?: Total Help from another person to put on and taking off regular upper body clothing?: Total Help from another person to put on and taking off regular lower body clothing?: Total 6 Click Score: 7    End of Session Equipment Utilized During Treatment: Oxygen;Other (comment)(2L [place]; stedy)  OT Visit Diagnosis: Unsteadiness on feet (R26.81);Muscle weakness (generalized) (M62.81);Hemiplegia and hemiparesis;History of falling (Z91.81);Other symptoms and signs  involving cognitive function Hemiplegia - Right/Left: Left Hemiplegia - dominant/non-dominant: Non-Dominant Hemiplegia - caused by: Cerebral infarction   Activity Tolerance Patient limited by lethargy   Patient Left in bed;with call bell/phone within reach;with bed alarm set   Nurse Communication Mobility status        Time: 1937-9024 OT Time Calculation (min): 37 min  Charges: OT General Charges $OT Visit: 1 Visit OT Treatments $Self Care/Home Management : 8-22 mins  Lanier Clam., COTA/L Acute Rehabilitation  Services [phone] [place] 12/05/2019, 1:05 PM

## 2019-12-05 NOTE — Progress Notes (Signed)
Physical Therapy Treatment Patient Details Name: [name] MRN: [id number] DOB: 31-Mar-1946 Today's Date: 12/05/2019    History of Present Illness 74yo F admitted with multiple falls prior and found to have enlarging brain lesion with vasogenic edema with mass effect. 6/1 brain mass removed with R craniotomy by Dr Christinia Gully  [facility] s/p fall with R MCA hemorrhagic R basal ganglia infarct HIV, adenocarcinoma of LUL s/p radiation therapy, HTN, HLD, COPD, chronic respiratory failure on 4 L/min oxygen at baseline, asthma, HBV, tobacco abuse, CKD III.    PT Comments    Patient in bed upon arrival, sleeping, but easily awakened by voice. Pt lethargic throughout session and participating minimally which is different from previous sessions with this therapist. RN notified. Pt requires max-total A +2 for bed mobility and functional transfer training using Stedy standing frame. Pt with heavy left lateral bias and pushing with R UE. Pt follows single step cues inconsistently this session. R eyes appears to be bothering pt as she kept it closed most of the time and rubbing it frequently. Given pt's current mobility level recommend SNF for further skilled PT services to maximize independence and safety with mobility.    Follow Up Recommendations  SNF     Equipment Recommendations  Wheelchair (measurements PT);Wheelchair cushion (measurements PT);Hospital bed(mechanical lift)    Recommendations for Other Services       Precautions / Restrictions Precautions Precautions: Fall Precaution Comments: L hemi Restrictions Weight Bearing Restrictions: No    Mobility  Bed Mobility Overal bed mobility: Needs Assistance Bed Mobility: Supine to Sit;Rolling;Sit to Sidelying Rolling: Max assist;Mod assist   Supine to sit: HOB elevated;Total assist;+2 for safety/equipment;+2 for physical assistance   Sit to sidelying: Max assist;+2 for physical assistance;+2 for safety/equipment General bed mobility  comments: multimodal cues for sequencing of all bed mobility; use of bed to bring hips to EOB and assist to mobilize bilat LE and elevate trunk into sitting; pt assisted minimally to bring R LE into bed from side lying; mod A to roll toward L side and max A to roll toward R side   Transfers Overall transfer level: Needs assistance   Transfers: Sit to/from Stand Sit to Stand: +2 physical assistance;+2 safety/equipment;Max assist;Total assist         General transfer comment: pt with heavy L lateral bias and tendency to push with R UE today; pt stood X 2-3 with +2 assist and +2 assist required to maintain sitting balance on Stedy standing frame; max multimodal cues for midline posture; pt with eyes closed at times and leaning anteriorly without assist/cues to sit upright; difficulty with hip extension especially second tial   Ambulation/Gait                 Stairs             Wheelchair Mobility    Modified Rankin (Stroke Patients Only)       Balance Overall balance assessment: Needs assistance Sitting-balance support: Feet supported;Single extremity supported Sitting balance-Leahy Scale: Zero Sitting balance - Comments: multimodal cues and assistance to maintain sitting balance EOB; pt pushing with R UE  Postural control: Posterior lean;Left lateral lean(anterior lean) Standing balance support: Bilateral upper extremity supported;During functional activity Standing balance-Leahy Scale: Zero                              Cognition Arousal/Alertness: Lethargic Behavior During Therapy: WFL for tasks assessed/performed;Flat affect Overall Cognitive Status: Impaired/Different  from baseline Area of Impairment: Attention;Memory;Following commands;Safety/judgement;Awareness;Problem solving                   Current Attention Level: Focused Memory: Decreased short-term memory Following Commands: Follows one step commands  inconsistently Safety/Judgement: Decreased awareness of deficits;Decreased awareness of safety Awareness: Intellectual Problem Solving: Requires verbal cues;Requires tactile cues;Slow processing;Decreased initiation;Difficulty sequencing General Comments: pt assisted minimally with mobility and kept eyes closed most of session although speaking to therapist and saying "okay" when given cues however unable to follow single step cues consistently      Exercises      General Comments        Pertinent Vitals/Pain Pain Assessment: Faces Faces Pain Scale: Hurts little more Pain Descriptors / Indicators: Headache Pain Intervention(s): Monitored during session    Home Living                      Prior Function            PT Goals (current goals can now be found in the care plan section) Progress towards PT goals: Not progressing toward goals - comment(lethargic this session)    Frequency    Min 3X/week      PT Plan Discharge plan needs to be updated    Co-evaluation PT/OT/SLP Co-Evaluation/Treatment: Yes Reason for Co-Treatment: Complexity of the patient's impairments (multi-system involvement);Necessary to address cognition/behavior during functional activity;For patient/therapist safety;To address functional/ADL transfers PT goals addressed during session: Balance;Mobility/safety with mobility        AM-PAC PT "6 Clicks" Mobility   Outcome Measure  Help needed turning from your back to your side while in a flat bed without using bedrails?: A Lot Help needed moving from lying on your back to sitting on the side of a flat bed without using bedrails?: A Lot Help needed moving to and from a bed to a chair (including a wheelchair)?: Total Help needed standing up from a chair using your arms (e.g., wheelchair or bedside chair)?: A Lot Help needed to walk in hospital room?: Total Help needed climbing 3-5 steps with a railing? : Total 6 Click Score: 9    End of  Session Equipment Utilized During Treatment: Oxygen;Gait belt Activity Tolerance: Patient tolerated treatment well Patient left: with call bell/phone within reach;in bed;with bed alarm set Nurse Communication: Mobility status;Need for lift equipment PT Visit Diagnosis: Unsteadiness on feet (R26.81);Muscle weakness (generalized) (M62.81);Other symptoms and signs involving the nervous system (W26.378)     Time: 5885-0277 PT Time Calculation (min) (ACUTE ONLY): 36 min  Charges:  $Therapeutic Activity: 8-22 mins                     Earney Navy, PTA Acute Rehabilitation Services Pager: [phone] Office: [phone]     Darliss Cheney 12/05/2019, 12:20 PM

## 2019-12-05 NOTE — Discharge Summary (Signed)
Discharge Summary  Date of Admission: 11/21/2019  Date of Discharge: 12/08/2019  Attending Physician: Emelda Brothers, MD  Hospital Course: Patient presented to the ED after falling at home and being down for a prolonged period. She presented with hypotension and mild hypoxemia with altered mental status, so a head CT was obtained. This showed a significant enlargement of a very small previously seen dural-based mass. It appeared concerning for either metastatic disease or a rapidly growing meningioma. CT CAP was negative for a primary source or other metastatic disease, so she was taken to the OR on 6/1 for a right craniotomy for resection of tumor. The tumor was infiltrating cortex and appeared malignant. She had post-operative left sided neglect and weakness. Post-op MRI showed a gross total resection, final pathology confirmed poorly differentiated carcinoma. Her anticoagulation was restarted on POD5. PT/OT recommended SNF. Her hospital course was otherwise uncomplicated and the patient was discharged to SNF on 12/08/19. She will follow up in clinic with me in 2 weeks. She was also given instructions to follow up with her oncologist, Dr. [name], for further workup to determine the primary source of her intracranial malignancy. She was aware of the final pathology.  Neurologic exam at discharge:  AOx3, PERRL, EOMI, FS, TM Strength 5/5 on R, difficult to make her move the left side but when she does it's roughly 3/5, otherwise 0/5  Discharge diagnosis: Brain tumor  Allergies as of 12/08/2019   No Known Allergies     Medication List    TAKE these medications   albuterol 108 (90 Base) MCG/ACT inhaler Commonly known as: VENTOLIN HFA Inhale 1 puff into the lungs every 6 (six) hours as needed for shortness of breath.   albuterol (2.5 MG/3ML) 0.083% nebulizer solution Commonly known as: PROVENTIL Take 3 mLs (2.5 mg total) by nebulization every 4 (four) hours as needed for wheezing or shortness  of breath.   apixaban 5 MG Tabs tablet Commonly known as: ELIQUIS Take 1 tablet (5 mg total) by mouth 2 (two) times daily.   Biktarvy 50-200-25 MG Tabs tablet Generic drug: bictegravir-emtricitabine-tenofovir AF Take 1 tablet by mouth daily.   diclofenac Sodium 1 % Gel Commonly known as: VOLTAREN Apply 4 g topically 2 (two) times daily as needed (pain).   Fluticasone-Salmeterol 500-50 MCG/DOSE Aepb Commonly known as: ADVAIR Inhale 1 puff into the lungs every 12 (twelve) hours.   gabapentin 800 MG tablet Commonly known as: NEURONTIN Take 800 mg by mouth 3 (three) times daily.   hydrOXYzine 25 MG tablet Commonly known as: ATARAX/VISTARIL Take 1 tablet (25 mg total) by mouth every 6 (six) hours.   ibuprofen 200 MG tablet Commonly known as: ADVIL Take 800 mg by mouth every 6 (six) hours as needed for moderate pain.   loratadine 10 MG tablet Commonly known as: CLARITIN Take 10 mg by mouth 2 (two) times daily.   montelukast 10 MG tablet Commonly known as: SINGULAIR Take 1 tablet (10 mg total) by mouth at bedtime.   permethrin 5 % cream Commonly known as: ELIMITE See admin instructions. apply to all body parts from neck to feet and wash off after 12 hours. May repeat in 7 days prn   QUEtiapine Fumarate 150 MG 24 hr tablet Commonly known as: SEROQUEL XR Take 150 mg by mouth at bedtime.   rOPINIRole 1 MG tablet Commonly known as: Requip Take 1 tablet (1 mg total) by mouth at bedtime.   Spiriva Respimat 2.5 MCG/ACT Aers Generic drug: Tiotropium Bromide Monohydrate INHALE 2  PUFFS INTO THE LUNGS DAILY What changed: See the new instructions.   tiZANidine 4 MG tablet Commonly known as: ZANAFLEX Take 4 mg by mouth 2 (two) times daily as needed for muscle spasms.   triamcinolone ointment 0.5 % Commonly known as: KENALOG Apply 1 application topically 2 (two) times daily.   venlafaxine XR 150 MG 24 hr capsule Commonly known as: EFFEXOR-XR Take 150 mg by mouth daily.    VITAMIN A PO Take 1 tablet by mouth daily.   VITAMIN B50 COMPLEX PO Take 1 tablet by mouth daily.   vitamin C 100 MG tablet Take 100 mg by mouth daily.        [name], MD 12/08/19

## 2019-12-05 NOTE — Progress Notes (Addendum)
Neurosurgery Service Progress Note  Subjective: No acute events overnight, no new complaints today  Objective: Vitals:   12/04/19 2330 12/05/19 0300 12/05/19 0727 12/05/19 0806  BP:   111/71 (!) 95/59  Pulse:   83 84  Resp:   16   Temp: 98.9 F (37.2 C) 98.3 F (36.8 C) 98.8 F (37.1 C)   TempSrc: Oral Oral Oral   SpO2:   95%   Weight:      Height:       Temp (24hrs), Avg:98.8 F (37.1 C), Min:98.3 F (36.8 C), Max:99.3 F (37.4 C)  CBC Latest Ref Rng & Units 12/05/2019 12/01/2019 11/30/2019  WBC 4.0 - 10.5 K/uL 6.9 7.5 9.3  Hemoglobin 12.0 - 15.0 g/dL 14.1 14.5 14.4  Hematocrit 36.0 - 46.0 % 45.7 48.1(H) 47.8(H)  Platelets 150 - 400 K/uL 278 234 202   BMP Latest Ref Rng & Units 12/05/2019 12/01/2019 11/29/2019  Glucose 70 - 99 mg/dL 103(H) 105(H) 112(H)  BUN 8 - 23 mg/dL 26(H) 19 18  Creatinine 0.44 - 1.00 mg/dL 0.96 0.82 0.93  BUN/Creat Ratio 6 - 22 (calc) - - -  Sodium 135 - 145 mmol/L 135 138 137  Potassium 3.5 - 5.1 mmol/L 4.5 4.1 4.8  Chloride 98 - 111 mmol/L 98 97(L) 101  CO2 22 - 32 mmol/L 28 30 28   Calcium 8.9 - 10.3 mg/dL 9.3 9.5 8.8(L)    Intake/Output Summary (Last 24 hours) at 12/05/2019 1051 Last data filed at 12/04/2019 2330 Gross per 24 hour  Intake 177 ml  Output 1150 ml  Net -973 ml    Current Facility-Administered Medications:  .  0.9 %  sodium chloride infusion, , Intravenous, PRN, [name], MD, Stopped at 11/29/19 1034 .  acetaminophen (TYLENOL) tablet 650 mg, 650 mg, Oral, Q6H PRN, 650 mg at 12/03/19 0041 **OR** acetaminophen (TYLENOL) suppository 650 mg, 650 mg, Rectal, Q6H PRN, Myles Rosenthal A, MD .  albuterol (PROVENTIL) (2.5 MG/3ML) 0.083% nebulizer solution 2.5 mg, 2.5 mg, Nebulization, Q4H PRN, Myles Rosenthal A, MD .  apixaban (ELIQUIS) tablet 5 mg, 5 mg, Oral, BID, Swayze, Ava, DO, 5 mg at 12/05/19 0927 .  bictegravir-emtricitabine-tenofovir AF (BIKTARVY) 50-200-25 MG per tablet 1 tablet, 1 tablet, Oral, Daily, Clance Boll, MD, 1 tablet at 12/04/19 0906 .  Chlorhexidine Gluconate Cloth 2 % PADS 6 each, 6 each, Topical, Daily, [name], MD, 6 each at 12/02/19 1111 .  diclofenac Sodium (VOLTAREN) 1 % topical gel 4 g, 4 g, Topical, BID PRN, Myles Rosenthal A, MD .  docusate sodium (COLACE) capsule 100 mg, 100 mg, Oral, BID, [name], MD, 100 mg at 12/05/19 0926 .  fluticasone furoate-vilanterol (BREO ELLIPTA) 200-25 MCG/INH 1 puff, 1 puff, Inhalation, Daily, [name], MD, 1 puff at 12/04/19 0733 .  gabapentin (NEURONTIN) capsule 800 mg, 800 mg, Oral, TID, [name], MD, 800 mg at 12/05/19 0926 .  HYDROcodone-acetaminophen (NORCO/VICODIN) 5-325 MG per tablet 1 tablet, 1 tablet, Oral, Q4H PRN, [name], MD, 1 tablet at 12/04/19 2154 .  HYDROmorphone (DILAUDID) injection 0.5 mg, 0.5 mg, Intravenous, Q3H PRN, [name], MD, 0.5 mg at 12/03/19 1943 .  labetalol (NORMODYNE) injection 10-40 mg, 10-40 mg, Intravenous, Q10 min PRN, [name], MD .  loratadine (CLARITIN) tablet 10 mg, 10 mg, Oral, BID, [name], MD, 10 mg at 12/05/19 0927 .  LORazepam (ATIVAN) injection 1-2 mg, 1-2 mg, Intravenous, Q2H PRN, [name], MD,  2 mg at 12/05/19 0047 .  montelukast (SINGULAIR) tablet 10 mg, 10 mg, Oral, QHS, Rashae Rother, Joyice Faster, MD, 10 mg at 12/04/19 2213 .  ondansetron (ZOFRAN) tablet 4 mg, 4 mg, Oral, Q4H PRN **OR** ondansetron (ZOFRAN) injection 4 mg, 4 mg, Intravenous, Q4H PRN, [name], MD, 4 mg at 11/28/19 2138 .  polyethylene glycol (MIRALAX / GLYCOLAX) packet 17 g, 17 g, Oral, BID, [name], MD, 17 g at 12/05/19 0927 .  polyethylene glycol (MIRALAX / GLYCOLAX) packet 17 g, 17 g, Oral, Daily PRN, [name], MD, 17 g at 11/30/19 6568 .  promethazine (PHENERGAN) tablet 12.5-25 mg, 12.5-25 mg, Oral, Q4H PRN, [name], MD .  QUEtiapine (SEROQUEL XR) 24 hr tablet 150 mg, 150 mg, Oral, QHS,  Ailyne Pawley A, MD, 150 mg at 12/04/19 2314 .  rOPINIRole (REQUIP) tablet 1 mg, 1 mg, Oral, QHS, Myles Rosenthal A, MD, 1 mg at 12/04/19 2213 .  senna (SENOKOT) tablet 17.2 mg, 2 tablet, Oral, QHS, Bonnielee Haff, MD, 17.2 mg at 12/04/19 2214 .  sodium phosphate (FLEET) 7-19 GM/118ML enema 1 enema, 1 enema, Rectal, Daily PRN, Bonnielee Haff, MD .  umeclidinium bromide (INCRUSE ELLIPTA) 62.5 MCG/INH 1 puff, 1 puff, Inhalation, Daily, [name], MD, 1 puff at 12/04/19 0733 .  venlafaxine XR (EFFEXOR-XR) 24 hr capsule 150 mg, 150 mg, Oral, Daily, [name], MD, 150 mg at 12/05/19 0927 .  vitamin C (ASCORBIC ACID) tablet 125 mg, 125 mg, Oral, Daily, [name], MD, 125 mg at 12/04/19 1275   Physical Exam: AOx3, FCx4 with left sided neglect, LUE / LLE 0/5 today, but significant component of neglect Incision c/d/i  Assessment & Plan: 74 y.o. 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 MRI brain w/ R convexity 3.5x2.4cm enhancing mass concerning for metastasis versus rapidly growing meningioma. CT CAP without evidence of primary. 5/27 vCTH done, 6/1 s/p craniotomy for tumor resection, 6/2 MRI with GTR, 6/7 final path poorly differentiated carcinoma, patient aware  -discussed w/ pt's oncologist, will have her see him after discharge to discuss further workup regarding her unknown primary -SNF recommended, pt wants to return home to a very poor living situation, where she was reportedly found down with no one to help her up for days and has a bed bug infestation she has not been able to clear. She still thinks her brain tumor is either related to or is just bed bugs, but she is Ox3. I think it's best to have psych evaluate for capacity and see if they can help better tease out what I think many would consider to be a bad  choice versus a true lack of capacity. She is now hemiparetic and non-ambulatory, so I don't know how she'd safely function at home without a significant support structure.  Joyice Faster Marquise Wicke  12/05/19 10:51 AM

## 2019-12-05 NOTE — Progress Notes (Addendum)
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 Per ems patient was found face down, but noted to able to give some history of her fall and noted complaints of pain on her left hip and back and denied any LOC at that time. Per EMS patient was found to be unkempt and her room was note to be squalid condition. At the scene her blood pressure as 80/50, sat was 87% she was off her home O2.She was treated with 500 ns en route.  ED Course: In the ED patient was noted to be lethargic and was unable to give history initial and worsen ensured related to change in mental status.   Evaluation was positive for a brain lesion.  Patient was hospitalized for further management. The patient was admitted to the ICU on 4N. She underwent a right craniotomy for resection of brain tumor on 11/28/2019 with Dr. Zada Finders. She has tolerated the procedure well, although she continues to complain of headache.  Plan is for discharge to SNF. Awaiting placement.  Consultants  . Hospitalist Procedures  Right craniotomy for resection of brain tumor (11/28/2019).  Antibiotics   Anti-infectives (From admission, onward)   Start     Dose/Rate Route Frequency Ordered Stop   11/29/19 0200  ceFAZolin (ANCEF) IVPB 2g/100 mL premix     2 g 200 mL/hr over 30 Minutes Intravenous Every 8 hours  11/28/19 2119 11/29/19 1105   11/28/19 1815  ceFAZolin (ANCEF) IVPB 2g/100 mL premix     2 g 200 mL/hr over 30 Minutes Intravenous  Once 11/28/19 1804 11/28/19 1821   11/28/19 1730  bacitracin 50,000 Units in sodium chloride 0.9 % 500 mL irrigation  Status:  Discontinued       As needed 11/28/19 1731 11/28/19 2033   11/21/19 2000  bictegravir-emtricitabine-tenofovir AF (BIKTARVY) 50-200-25 MG per tablet 1 tablet     1 tablet Oral Daily 11/21/19 1953       Subjective  The patient is sitting up at bedside. No new complaints. She is a little lethargic today.  Objective   Vitals:  Vitals:   12/05/19 0806 12/05/19 1209  BP: (!) 95/59 112/67  Pulse: 84 78  Resp:  16  Temp:  97.8 F (36.6 C)  SpO2:  90%  d Exam:  Constitutional:  . The patient is somnolent, but rouseable. No acute distress. Respiratory:  . No increased work of breathing. . No wheezes, rales, or rhonchi . No tactile fremitus Cardiovascular:  . Regular rate and rhythm . No murmurs, ectopy, or gallups. . No lateral PMI. No thrills. Abdomen:  . Abdomen is soft, non-tender, non-distended . No hernias, masses, or organomegaly . Normoactive bowel sounds.  Musculoskeletal:  . No cyanosis, clubbing, or edema Skin:  . No rashes, lesions, ulcers . palpation of skin: no induration or nodules Neurologic:  . CN 2-12 intact . Sensation all 4 extremities intact Psychiatric:  . Mental status o Mood, affect appropriate o Orientation to person, place, time  .  judgment and insight appear intact  I have personally reviewed the following:   Today's Data  . Vitals, HIV 1 Quantitative RNA . Log 10 HIV-1 RNA  Micro Data  . PCR negative for MRSA, positive for staph aureus  Imaging  . MRI Brain 11/29/2019  Cardiology Data  . Echo 11/22/2019: EF 17-61%, RV systolic function is normal and RV size is normal. PA pressure cannot be assessed. Atrial septum is grossly normal.  Scheduled Meds: . apixaban  5 mg Oral BID  .  bictegravir-emtricitabine-tenofovir AF  1 tablet Oral Daily  . Chlorhexidine Gluconate Cloth  6 each Topical Daily  . docusate sodium  100 mg Oral BID  . fluticasone furoate-vilanterol  1 puff Inhalation Daily  . gabapentin  800 mg Oral TID  . loratadine  10 mg Oral BID  . montelukast  10 mg Oral QHS  . polyethylene glycol  17 g Oral BID  . QUEtiapine Fumarate  150 mg Oral QHS  . rOPINIRole  1 mg Oral QHS  . senna  2 tablet Oral QHS  . umeclidinium bromide  1 puff Inhalation Daily  . venlafaxine XR  150 mg Oral Daily  . vitamin C  125 mg Oral Daily   Continuous Infusions: . sodium chloride Stopped (11/29/19 1034)    Active Problems:   Change in mental status   Brain tumor ([place])   Cerebral edema ([facility])   Encephalopathy   HIV disease ([facility])   OSA (obstructive sleep apnea)   Dyslipidemia   History of CVA with residual deficit   LOS: 14 days   A & P  Enlarging Brain lesion with vasogenic edema and Mass effect: S/P craniotomy with excision of brain tumor.  There has been concern for seizure activity.  Based on H&P this issue was discussed with a neurologist who did not recommend steroids or antiepileptic drugs at this time. EEG was performed and did not show any epileptiform activity.  Neurosurgery is following. They recommended staging work-up due to patient's previous history of lung cancer. Patient previously seen by Dr. [name]. CT scan of the chest abdomen pelvis does not show any evidence for either primary tumor or metastatic process. The patient has tolerated the surgery well. She does continue to have headache pain that has not been relieved by current pain regimen. I did increase the dose of her oral percocet. She states that her headache is still present, but a little bit better. She states that she has severe pain only when she touches her incision site. The patient has been cleared to restart eliquis per neurosurgery.  Acute Encephalopathy: Resolved. Seems to be back to her  baseline. Could be secondary to fall, dehydration, brain lesion.  Acute hypoxic/hypercarbic respiratory failure on chronic hypoxic respiratory failure: Possibly related to non-compliance with home oxygen. She is on home oxygen at 3 to 4 L/min. Remained stable on supplemental oxygen. She is currently saturating 90% on 2L by nasal cannula.  Hx of Lung CA: This involved her left upper lobe.  Appears that she underwent SRS and radiation treatment.  No evidence for recurrence on CT scan.  Mild AKI on CKDIII: Resolved with IV hydration.  Hypokalemia: Resolved. Both potassium and magnesium were repleted.    HIV:  Noted. Stable. Continue antiretrovirals.  Fall ,Mechanical: No injuries noted on work-up.-no fx on noted on work up.  History of COPD /Asthma: Seems to be stable. Continue home medication regimen.  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  This will need to be discontinued for her neurosurgery as discussed above. Discussed with Dr. Leonie Man with stroke service.  He recommends stopping apixaban and bridging with IV heparin about 3 to 4 days prior to surgery. She was being bridged with IV heparin, held at midnight.. Eliquis has been restarted as per neurosurgery's guidance.  Hx of CVA, nonhemorrhagic 9/20: Sequela of left sided weakness.  Patient on apixaban at home. Now restarted.  History of essential hypertension: Patient's blood pressure were elevated initially.  Now she is noted to be normotensive.  Continue to monitor.  Leave her off of medications for now.    OSA: CPAP qhs   Chronic Tobacco abuse: Patient continue to smoke daily. Nicotine patch /encourage cessation.  Constipation: Resolved with laxatives.  I have seen and examined this patient myself. I have spent 32 minutes in her evaluation and care.  DVT Prophylaxis: Eliquis Code Status: Full code Family Communication:  Discussed with the patient. No family at bedside. Disposition Plan:  Patient is from home. Anticipate discharge to SNF. Barriers to discharge is placement. Discharge is as per the primary service - neurosurgery.  Bedie Dominey, DO [facility] Direct contact: see [email or web address]  7PM-7AM contact night coverage as above 12/05/2019, 12:50 PM  LOS: 8 days

## 2019-12-06 ENCOUNTER — Other Ambulatory Visit: Payer: Self-pay | Admitting: Radiation Therapy

## 2019-12-06 DIAGNOSIS — F22 Delusional disorders: Secondary | ICD-10-CM

## 2019-12-06 NOTE — Progress Notes (Addendum)
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  In the emergency room she was noted to be lethargic.  Evaluation revealed a brain lesion for which she was admitted to the neuro ICU.  She underwent right craniotomy for resection of brain tumor on 11/28/2019.  Biopsy consistent with poorly differentiated carcinoma.  Awaiting placement.   Assessment & Plan:   Active Problems:   Change in mental status   Brain tumor ([place])   Cerebral edema (HCC)   Encephalopathy   HIV disease (HCC)   OSA (obstructive sleep apnea)   Dyslipidemia   History of CVA with residual deficit   Delusional disorder ([place])  Brain tumor with vasogenic edema and mass-effect: Poorly differentiated carcinoma. Status post resection.  Postsurgical is stable.  Eliquis resumed.  Patient to follow-up with Dr. [name] after discharge.  Acute metabolic encephalopathy: Resolved.  Chronic hypoxic respiratory failure and obstructive sleep apnea: Using CPAP at night.  Using oxygen 2 to 3 L in the daytime.  Stable.  AKI on CKD stage III: Resolved with IV hydration.  History of PE, thromboembolism: Therapeutic on Eliquis.  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  Work with PT OT.  Hypertension: Blood pressure stable.  HIV: On antiretroviral.  Last CD4 count was more than 700.  Viral count 200.  Well-controlled.   DVT prophylaxis: Eliquis  Code Status: Full code  Family Communication: None  Disposition Plan: Status is: Inpatient  Remains  inpatient appropriate because:Unsafe d/c plan   Dispo: The patient is from: Home              Anticipated d/c is to: SNF              Anticipated d/c date is: 1 day              Patient currently is medically stable to d/c.        Procedures:   Craniotomy 6/1  Antimicrobials:   None     Subjective: Patient seen and examined.  No overnight events.  Poor historian.  She was agreeable to go to rehab.  She was not sure what she will do about her tumor.  Objective: Vitals:   12/06/19 0416 12/06/19 0749 12/06/19 0810 12/06/19 1139  BP: 109/69 110/83  100/61  Pulse: 76 75  72  Resp: 15 15  20   Temp: 98.9 F (37.2 C) 97.9 F (36.6 C)  98.4 F (36.9 C)  TempSrc: Axillary Axillary  Axillary  SpO2: 93% 100% 99%   Weight:      Height:        Intake/Output Summary (Last 24 hours) at 12/06/2019 1401 Last data filed at 12/06/2019 1100 Gross per 24 hour  Intake 696 ml  Output 1800 ml  Net -1104 ml   Filed Weights   11/28/19 1650 11/29/19 0448 12/04/19 0500  Weight: 87 kg 75.1 kg 73.3 kg    Examination:  General exam: Appears calm and comfortable, chronically sick looking.  On 3 L oxygen.  Looks comfortable. Respiratory system: Clear to auscultation. Respiratory effort normal.  Looks  comfortable on 3 L oxygen. Cardiovascular system: S1 & S2 heard, RRR.  Gastrointestinal system: Abdomen is nondistended, soft and nontender.  Central nervous system: Alert and oriented.  Left hemiplegia, 3/5.    Data Reviewed: I have personally reviewed following labs and imaging studies  CBC: Recent Labs  Lab 11/30/19 0359 12/01/19 1053 12/05/19 0636  WBC 9.3 7.5 6.9  NEUTROABS  --  4.2 2.8  HGB 14.4 14.5 14.1  HCT 47.8* 48.1* 45.7  MCV 92.6 92.1 91.2  PLT 202 234 557   Basic Metabolic Panel: Recent Labs  Lab 12/01/19 1053 12/05/19 0636  NA 138 135  K 4.1 4.5  CL 97* 98  CO2 30 28  GLUCOSE 105* 103*  BUN 19 26*  CREATININE 0.82 0.96  CALCIUM 9.5 9.3    GFR: Estimated Creatinine Clearance: 55.4 mL/min (by C-G formula based on SCr of 0.96 mg/dL). Liver Function Tests: No results for input(s): AST, ALT, ALKPHOS, BILITOT, PROT, ALBUMIN in the last 168 hours. No results for input(s): LIPASE, AMYLASE in the last 168 hours. No results for input(s): AMMONIA in the last 168 hours. Coagulation Profile: No results for input(s): INR, PROTIME in the last 168 hours. Cardiac Enzymes: No results for input(s): CKTOTAL, CKMB, CKMBINDEX, TROPONINI in the last 168 hours. BNP (last 3 results) No results for input(s): PROBNP in the last 8760 hours. HbA1C: No results for input(s): HGBA1C in the last 72 hours. CBG: No results for input(s): GLUCAP in the last 168 hours. Lipid Profile: No results for input(s): CHOL, HDL, LDLCALC, TRIG, CHOLHDL, LDLDIRECT in the last 72 hours. Thyroid Function Tests: No results for input(s): TSH, T4TOTAL, FREET4, T3FREE, THYROIDAB in the last 72 hours. Anemia Panel: No results for input(s): VITAMINB12, FOLATE, FERRITIN, TIBC, IRON, RETICCTPCT in the last 72 hours. Sepsis Labs: No results for input(s): PROCALCITON, LATICACIDVEN in the last 168 hours.  Recent Results (from the past 240 hour(s))  Surgical pcr screen     Status: Abnormal   Collection Time: 11/28/19  6:59 AM   Specimen: Nasal Mucosa; Nasal Swab  Result Value Ref Range Status   MRSA, PCR NEGATIVE NEGATIVE Final   Staphylococcus aureus POSITIVE (A) NEGATIVE Final    Comment: (NOTE) The Xpert SA Assay (FDA approved for NASAL specimens in patients 20 years of age and older), is one component of a comprehensive surveillance program. It is not intended to diagnose infection nor to guide or monitor treatment. Performed at [facility], [place] [address], [place], [place] [address]          Radiology Studies: No results found.      Scheduled Meds:  apixaban  5 mg Oral BID   bictegravir-emtricitabine-tenofovir AF  1 tablet Oral Daily    Chlorhexidine Gluconate Cloth  6 each Topical Daily   docusate sodium  100 mg Oral BID   fluticasone furoate-vilanterol  1 puff Inhalation Daily   gabapentin  800 mg Oral TID   loratadine  10 mg Oral BID   montelukast  10 mg Oral QHS   polyethylene glycol  17 g Oral BID   QUEtiapine Fumarate  150 mg Oral QHS   rOPINIRole  1 mg Oral QHS   senna  2 tablet Oral QHS   umeclidinium bromide  1 puff Inhalation Daily   venlafaxine XR  150 mg Oral Daily   vitamin C  125 mg Oral Daily   Continuous Infusions:  sodium chloride Stopped (11/29/19 1034)     LOS: 15 days  Time spent: 25 minutes    Barb Merino, MD [facility] Pager [phone]

## 2019-12-06 NOTE — Consult Note (Signed)
[facility] Face-to-Face Psychiatry Consult   Reason for Consult:  Capacity, delusions Referring Physician:  Dr. Zada Finders Patient Identification: [name] MRN:  [id number] Principal Diagnosis: Brain tumor Diagnosis:  Active Problems:   Delusional disorder ([place])   Change in mental status   Brain tumor ([place])   Cerebral edema ([place])   Encephalopathy   HIV disease ([place])   OSA (obstructive sleep apnea)   Dyslipidemia   History of CVA with residual deficit  Total Time spent with patient: 1 hour  Subjective:   [name] is a 74 y.o. female patient admitted with falls, brain tumor.  Patient seen and evaluated in person by this provider.  On initial assessment she did not feel like she needed to rehab or other assistance after discharge.  When her brain tumor was discussed and the need for her to rehab she agreed to return to [facility] where she was successful last time she was there she is concerned about close, does say her roommates could bring it to her.  Reports having headaches now "all the time" along with neck pain.  She does voice understanding that this is related to her brain tumor and acknowledges the fact that she has a brain tumor, not delusional on assessment.  Once things were clearly explained to her, she understood the rationale for rehab or the next step for care.  She does have mental capacity this time to make decisions.  HPI per MD: [name] is a 74 y.o. female with past medical history significant for HIV on antiretrovirals, adenocarcinoma of the left upper lobe status post radiation, HTN, COPD on chronic home O2, asthma, stage III CKD, obstructive sleep apnea on CPAP,chronic tobacco abuse, hyperlipidemia, CVA with chronic left-sided weakness, PE ,embolus in the right arotic arch with right renal infarct and splenic infarcts discharged on life long anticoag with apixiaban, who presents to the ED today s/p mechanical fall at home later followed by near syncopal  event and change in mental status. Per ems patient was found face down , but noted to able to give some history of her fall and noted complaints of pain on her left hip and back and denied any LOC at that time. Per EMS patient was found to be unkempt and her room was note to be squalid condition. At the scene her blood pressure as 80/50, sat was 87% she was off her home O2.She was treated with 500 ns en route. Per patient who on my interview is back to her baseline ms,states she was ambulating the bathroom when she reached for her walker lost her grip/balance and fell. She notes no prodrome prior to this episode and states she felt well. She states s/p fall she was unable to get up and ems was called. She also noted that during fall she did hit her chin on her walker and the walker tumbled and make contact with her forehead. She did however note that she did not have LOC. She did however note pain on left side ,hip and back. She also notes once she was placed to sit in chair she felt presyncopal, but states again had no LOC. She notes no fever , but has had chills, no sob, nausea/vomitting, dysuria, diarrhea, abdominal pain or chest pain,no new focal weakness,no change in speech, no difficulty swallowing, note intermittent incontinence of her bladder but not of her bowels. She does however note that she has had recent history of treatment for pediculosis and still suffers from skin  irritation due to that. She also does admit to being weak and not having much help at home.    Past Psychiatric History: depression  Risk to Self:   Risk to Others:  none Prior Inpatient Therapy:  none Prior Outpatient Therapy:  none  Past Medical History:  Past Medical History:  Diagnosis Date  . Asthma   . Atypical squamous cells of undetermined significance (ASCUS) on Papanicolaou smear of cervix   . Bed bug bite 07/12/2019  . COPD (chronic obstructive pulmonary disease) ([place])   . H/O drainage of abscess    left axilla,  from left breast  . Hep B w/o coma   . HIV (human immunodeficiency virus infection) ([place])   . Hyperlipidemia   . Lung cancer ([place]) 12/14/13   LUL Adenocarcinoma  . Neuromuscular disorder ([place])    fingers/feet neuropathy  . Non-small cell lung cancer ([place])   . S/P radiation therapy 01/26/14-02/02/14   sbrt  lt upper lung-54Gy/37fx    Past Surgical History:  Procedure Laterality Date  . ABCESS DRAINAGE     abcess under Left arm, abcess from L breast  . APPLICATION OF CRANIAL NAVIGATION Right 11/28/2019   Procedure: APPLICATION OF CRANIAL NAVIGATION;  Surgeon: [name], MD;  Location: [facility];  Service: Neurosurgery;  Laterality: Right;  . BUBBLE STUDY  03/08/2019   Procedure: BUBBLE STUDY;  Surgeon: [name], MD;  Location: [facility];  Service: Cardiovascular;;  . CRANIOTOMY Right 11/28/2019   Procedure: Right Craniotomy for Tumor Resection;  Surgeon: [name], MD;  Location: [facility];  Service: Neurosurgery;  Laterality: Right;  . removal of abnormal cells on uterus    . TEE WITHOUT CARDIOVERSION N/A 03/08/2019   Procedure: TRANSESOPHAGEAL ECHOCARDIOGRAM (TEE);  Surgeon: [name], MD;  Location[facility]COPY;  Service: Cardiovascular;  Laterality: N/A;  . thyroid gland removed     Family History:  Family History  Problem Relation Age of Onset  . Pneumonia Mother   . Hypertension Sister   . Diabetes Sister   . Cancer Sister        pancreatic and lung  . Cancer Brother        pancreatic  . Cancer Brother        pancreatic   Family Psychiatric  History: none Social History:  Social History   Substance and Sexual Activity  Alcohol Use No  . Alcohol/week: 0.0 standard drinks   Comment: none in 20 years     Social History   Substance and Sexual Activity  Drug Use No   Comment: none in 20 years    Social History   Socioeconomic History  . Marital status: Single    Spouse name: Not on file  . Number of children: Not on file  . Years of  education: Not on file  . Highest education level: Not on file  Occupational History  . Not on file  Tobacco Use  . Smoking status: Current Every Day Smoker    Packs/day: 1.00    Years: 40.00    Pack years: 40.00    Types: Cigarettes  . Smokeless tobacco: Never Used  . Tobacco comment: quit date the day she starts her radiation at the [facility]  Substance and Sexual Activity  . Alcohol use: No    Alcohol/week: 0.0 standard drinks    Comment: none in 20 years  . Drug use: No    Comment: none in 20 years  . Sexual activity: Not Currently  Partners: Male    Comment: given condoms  Other Topics Concern  . Not on file  Social History Narrative  . Not on file   Social Determinants of Health   Financial Resource Strain:   . Difficulty of Paying Living Expenses:   Food Insecurity:   . Worried About Charity fundraiser in the Last Year:   . Arboriculturist in the Last Year:   Transportation Needs:   . Film/video editor (Medical):   Marland Kitchen Lack of Transportation (Non-Medical):   Physical Activity:   . Days of Exercise per Week:   . Minutes of Exercise per Session:   Stress:   . Feeling of Stress :   Social Connections:   . Frequency of Communication with Friends and Family:   . Frequency of Social Gatherings with Friends and Family:   . Attends Religious Services:   . Active Member of Clubs or Organizations:   . Attends Archivist Meetings:   Marland Kitchen Marital Status:    Additional Social History:    Allergies:  No Known Allergies  Labs:  Results for orders placed or performed during the hospital encounter of 11/21/19 (from the past 48 hour(s))  CBC with Differential/Platelet     Status: None   Collection Time: 12/05/19  6:36 AM  Result Value Ref Range   WBC 6.9 4.0 - 10.5 K/uL   RBC 5.01 3.87 - 5.11 MIL/uL   Hemoglobin 14.1 12.0 - 15.0 g/dL   HCT 45.7 36.0 - 46.0 %   MCV 91.2 80.0 - 100.0 fL   MCH 28.1 26.0 - 34.0 pg   MCHC 30.9 30.0 - 36.0 g/dL   RDW  15.1 11.5 - 15.5 %   Platelets 278 150 - 400 K/uL   nRBC 0.0 0.0 - 0.2 %   Neutrophils Relative % 40 %   Neutro Abs 2.8 1.7 - 7.7 K/uL   Lymphocytes Relative 45 %   Lymphs Abs 3.1 0.7 - 4.0 K/uL   Monocytes Relative 10 %   Monocytes Absolute 0.7 0.1 - 1.0 K/uL   Eosinophils Relative 4 %   Eosinophils Absolute 0.2 0.0 - 0.5 K/uL   Basophils Relative 1 %   Basophils Absolute 0.1 0.0 - 0.1 K/uL   Immature Granulocytes 0 %   Abs Immature Granulocytes 0.02 0.00 - 0.07 K/uL    Comment: Performed at [facility], [address], [place], [place] [address]  Basic metabolic panel     Status: Abnormal   Collection Time: 12/05/19  6:36 AM  Result Value Ref Range   Sodium 135 135 - 145 mmol/L   Potassium 4.5 3.5 - 5.1 mmol/L   Chloride 98 98 - 111 mmol/L   CO2 28 22 - 32 mmol/L   Glucose, Bld 103 (H) 70 - 99 mg/dL    Comment: Glucose reference range applies only to samples taken after fasting for at least 8 hours.   BUN 26 (H) 8 - 23 mg/dL   Creatinine, Ser 0.96 0.44 - 1.00 mg/dL   Calcium 9.3 8.9 - 10.3 mg/dL   GFR calc non Af Amer 59 (L) >60 mL/min   GFR calc Af Amer >60 >60 mL/min   Anion gap 9 5 - 15    Comment: Performed at [facility] [address], [place], [place] [address]    Current Facility-Administered Medications  Medication Dose Route Frequency Provider Last Rate Last Admin  . 0.9 %  sodium chloride infusion  Intravenous PRN [name], MD   Stopped at 11/29/19 1034  . acetaminophen (TYLENOL) tablet 650 mg  650 mg Oral Q6H PRN Clance Boll, MD   650 mg at 12/03/19 0041   Or  . acetaminophen (TYLENOL) suppository 650 mg  650 mg Rectal Q6H PRN Myles Rosenthal A, MD      . albuterol (PROVENTIL) (2.5 MG/3ML) 0.083% nebulizer solution 2.5 mg  2.5 mg Nebulization Q4H PRN Clance Boll, MD      . apixaban (ELIQUIS) tablet 5 mg  5 mg Oral BID Swayze, Ava, DO   5 mg at 12/06/19 0943  . bictegravir-emtricitabine-tenofovir AF (BIKTARVY)  50-200-25 MG per tablet 1 tablet  1 tablet Oral Daily Clance Boll, MD   1 tablet at 12/06/19 0943  . Chlorhexidine Gluconate Cloth 2 % PADS 6 each  6 each Topical Daily [name], MD   6 each at 12/06/19 0944  . diclofenac Sodium (VOLTAREN) 1 % topical gel 4 g  4 g Topical BID PRN Myles Rosenthal A, MD      . docusate sodium (COLACE) capsule 100 mg  100 mg Oral BID [name], MD   100 mg at 12/06/19 0943  . fluticasone furoate-vilanterol (BREO ELLIPTA) 200-25 MCG/INH 1 puff  1 puff Inhalation Daily [name], MD   1 puff at 12/06/19 0810  . gabapentin (NEURONTIN) capsule 800 mg  800 mg Oral TID [name], MD   800 mg at 12/06/19 0943  . HYDROcodone-acetaminophen (NORCO/VICODIN) 5-325 MG per tablet 1 tablet  1 tablet Oral Q4H PRN [name], MD   1 tablet at 12/06/19 0839  . HYDROmorphone (DILAUDID) injection 0.5 mg  0.5 mg Intravenous Q3H PRN [name], MD   0.5 mg at 12/05/19 2250  . labetalol (NORMODYNE) injection 10-40 mg  10-40 mg Intravenous Q10 min PRN [name], MD      . loratadine (CLARITIN) tablet 10 mg  10 mg Oral BID [name], MD   10 mg at 12/06/19 0943  . LORazepam (ATIVAN) injection 1-2 mg  1-2 mg Intravenous Q2H PRN [name], MD   2 mg at 12/05/19 0047  . montelukast (SINGULAIR) tablet 10 mg  10 mg Oral QHS [name], MD   10 mg at 12/05/19 2141  . ondansetron (ZOFRAN) tablet 4 mg  4 mg Oral Q4H PRN [name], MD       Or  . ondansetron (ZOFRAN) injection 4 mg  4 mg Intravenous Q4H PRN [name], MD   4 mg at 12/05/19 2250  . polyethylene glycol (MIRALAX / GLYCOLAX) packet 17 g  17 g Oral BID [name], MD   17 g at 12/06/19 0943  . polyethylene glycol (MIRALAX / GLYCOLAX) packet 17 g  17 g Oral Daily PRN [name], MD   17 g at 11/30/19 9741  . promethazine (PHENERGAN) tablet 12.5-25 mg  12.5-25 mg Oral Q4H PRN [name], MD       . QUEtiapine (SEROQUEL XR) 24 hr tablet 150 mg  150 mg Oral QHS [name], MD   150 mg at 12/05/19 2141  . rOPINIRole (REQUIP) tablet 1 mg  1 mg Oral QHS Myles Rosenthal A, MD   1 mg at 12/05/19 2141  . senna (SENOKOT) tablet 17.2 mg  2 tablet Oral QHS Bonnielee Haff, MD   17.2 mg at 12/05/19 2141  . sodium phosphate (  FLEET) 7-19 GM/118ML enema 1 enema  1 enema Rectal Daily PRN Bonnielee Haff, MD      . umeclidinium bromide (INCRUSE ELLIPTA) 62.5 MCG/INH 1 puff  1 puff Inhalation Daily [name], MD   1 puff at 12/06/19 0810  . venlafaxine XR (EFFEXOR-XR) 24 hr capsule 150 mg  150 mg Oral Daily [name], MD   150 mg at 12/06/19 0943  . vitamin C (ASCORBIC ACID) tablet 125 mg  125 mg Oral Daily [name], MD   125 mg at 12/06/19 6629    Musculoskeletal: Strength & Muscle Tone: decreased Gait & Station: did not witness Patient leans: N/A  Psychiatric Specialty Exam: Physical Exam  Nursing note and vitals reviewed. Constitutional: She is oriented to person, place, and time. She appears well-developed and well-nourished.  HENT:  Head: Normocephalic.  Respiratory: Effort normal.  Musculoskeletal:     Cervical back: Normal range of motion.  Neurological: She is alert and oriented to person, place, and time.  Psychiatric: Her speech is normal and behavior is normal. Judgment and thought content normal. Her mood appears anxious. Her affect is blunt. Cognition and memory are normal.    Review of Systems  Musculoskeletal: Positive for neck pain.  Neurological: Positive for headaches.  Psychiatric/Behavioral: The patient is nervous/anxious.   All other systems reviewed and are negative.   Blood pressure 100/61, pulse 72, temperature 98.4 F (36.9 C), temperature source Axillary, resp. rate 20, height 5' 9.03" (1.753 m), weight 73.3 kg, SpO2 99 %.Body mass index is 23.84 kg/m.  General Appearance: Casual  Eye Contact:  Good  Speech:  Normal  Rate  Volume:  Normal  Mood:  Anxious  Affect:  Blunt  Thought Process:  Coherent and Descriptions of Associations: Intact  Orientation:  Full (Time, Place, and Person)  Thought Content:  WDL and Logical  Suicidal Thoughts:  No  Homicidal Thoughts:  No  Memory:  Immediate;   Good Recent;   Good Remote;   Good  Judgement:  Fair  Insight:  Fair  Psychomotor Activity:  Decreased  Concentration:  Concentration: Fair and Attention Span: Fair  Recall:  [employer] of Knowledge:  Fair  Language:  Good  Akathisia:  No  Handed:  Right  AIMS (if indicated):     Assets:  Leisure Time Resilience  ADL's:  Impaired  Cognition:  WNL  Sleep:        Treatment Plan Summary: Delusional disorder: -No delusions on assessment  Capacity: -Client is alert and oriented x4 and is aware she now needs the next step of going to rehab after hospitalization, patient agreeable.  Disposition: No evidence of imminent risk to self or others at present.   Patient does not meet criteria for psychiatric inpatient admission.  Waylan Boga, NP 12/06/2019 11:58 AM

## 2019-12-06 NOTE — Progress Notes (Signed)
Neurosurgery Service Progress Note  Subjective: No acute events overnight, no new complaints today  Objective: Vitals:   12/05/19 2345 12/06/19 0416 12/06/19 0749 12/06/19 0810  BP: 127/78 109/69 110/83   Pulse: 79 76 75   Resp: 15 15 15    Temp: 98.6 F (37 C) 98.9 F (37.2 C) 97.9 F (36.6 C)   TempSrc: Axillary Axillary Axillary   SpO2: 100% 93% 100% 99%  Weight:      Height:       Temp (24hrs), Avg:98.3 F (36.8 C), Min:97.8 F (36.6 C), Max:98.9 F (37.2 C)  CBC Latest Ref Rng & Units 12/05/2019 12/01/2019 11/30/2019  WBC 4.0 - 10.5 K/uL 6.9 7.5 9.3  Hemoglobin 12.0 - 15.0 g/dL 14.1 14.5 14.4  Hematocrit 36.0 - 46.0 % 45.7 48.1(H) 47.8(H)  Platelets 150 - 400 K/uL 278 234 202   BMP Latest Ref Rng & Units 12/05/2019 12/01/2019 11/29/2019  Glucose 70 - 99 mg/dL 103(H) 105(H) 112(H)  BUN 8 - 23 mg/dL 26(H) 19 18  Creatinine 0.44 - 1.00 mg/dL 0.96 0.82 0.93  BUN/Creat Ratio 6 - 22 (calc) - - -  Sodium 135 - 145 mmol/L 135 138 137  Potassium 3.5 - 5.1 mmol/L 4.5 4.1 4.8  Chloride 98 - 111 mmol/L 98 97(L) 101  CO2 22 - 32 mmol/L 28 30 28   Calcium 8.9 - 10.3 mg/dL 9.3 9.5 8.8(L)    Intake/Output Summary (Last 24 hours) at 12/06/2019 1045 Last data filed at 12/06/2019 0900 Gross per 24 hour  Intake 696 ml  Output 1300 ml  Net -604 ml    Current Facility-Administered Medications:  .  0.9 %  sodium chloride infusion, , Intravenous, PRN, [name], MD, Stopped at 11/29/19 1034 .  acetaminophen (TYLENOL) tablet 650 mg, 650 mg, Oral, Q6H PRN, 650 mg at 12/03/19 0041 **OR** acetaminophen (TYLENOL) suppository 650 mg, 650 mg, Rectal, Q6H PRN, Myles Rosenthal A, MD .  albuterol (PROVENTIL) (2.5 MG/3ML) 0.083% nebulizer solution 2.5 mg, 2.5 mg, Nebulization, Q4H PRN, Myles Rosenthal A, MD .  apixaban (ELIQUIS) tablet 5 mg, 5 mg, Oral, BID, Swayze, Ava, DO, 5 mg at 12/06/19 0943 .  bictegravir-emtricitabine-tenofovir AF (BIKTARVY) 50-200-25 MG per tablet 1 tablet, 1 tablet,  Oral, Daily, Clance Boll, MD, 1 tablet at 12/06/19 0943 .  Chlorhexidine Gluconate Cloth 2 % PADS 6 each, 6 each, Topical, Daily, [name], MD, 6 each at 12/06/19 0944 .  diclofenac Sodium (VOLTAREN) 1 % topical gel 4 g, 4 g, Topical, BID PRN, Myles Rosenthal A, MD .  docusate sodium (COLACE) capsule 100 mg, 100 mg, Oral, BID, [name], MD, 100 mg at 12/06/19 0943 .  fluticasone furoate-vilanterol (BREO ELLIPTA) 200-25 MCG/INH 1 puff, 1 puff, Inhalation, Daily, [name], MD, 1 puff at 12/06/19 0810 .  gabapentin (NEURONTIN) capsule 800 mg, 800 mg, Oral, TID, [name], MD, 800 mg at 12/06/19 0943 .  HYDROcodone-acetaminophen (NORCO/VICODIN) 5-325 MG per tablet 1 tablet, 1 tablet, Oral, Q4H PRN, [name], MD, 1 tablet at 12/06/19 0839 .  HYDROmorphone (DILAUDID) injection 0.5 mg, 0.5 mg, Intravenous, Q3H PRN, [name], MD, 0.5 mg at 12/05/19 2250 .  labetalol (NORMODYNE) injection 10-40 mg, 10-40 mg, Intravenous, Q10 min PRN, [name], MD .  loratadine (CLARITIN) tablet 10 mg, 10 mg, Oral, BID, [name], MD, 10 mg at 12/06/19 0943 .  LORazepam (ATIVAN) injection 1-2 mg, 1-2 mg, Intravenous, Q2H PRN, [name], MD, 2  mg at 12/05/19 0047 .  montelukast (SINGULAIR) tablet 10 mg, 10 mg, Oral, QHS, Luevenia Mcavoy, Joyice Faster, MD, 10 mg at 12/05/19 2141 .  ondansetron (ZOFRAN) tablet 4 mg, 4 mg, Oral, Q4H PRN **OR** ondansetron (ZOFRAN) injection 4 mg, 4 mg, Intravenous, Q4H PRN, [name], MD, 4 mg at 12/05/19 2250 .  polyethylene glycol (MIRALAX / GLYCOLAX) packet 17 g, 17 g, Oral, BID, [name], MD, 17 g at 12/06/19 0943 .  polyethylene glycol (MIRALAX / GLYCOLAX) packet 17 g, 17 g, Oral, Daily PRN, [name], MD, 17 g at 11/30/19 8242 .  promethazine (PHENERGAN) tablet 12.5-25 mg, 12.5-25 mg, Oral, Q4H PRN, [name], MD .  QUEtiapine (SEROQUEL XR) 24 hr tablet 150  mg, 150 mg, Oral, QHS, Zadkiel Dragan, Joyice Faster, MD, 150 mg at 12/05/19 2141 .  rOPINIRole (REQUIP) tablet 1 mg, 1 mg, Oral, QHS, Myles Rosenthal A, MD, 1 mg at 12/05/19 2141 .  senna (SENOKOT) tablet 17.2 mg, 2 tablet, Oral, QHS, Bonnielee Haff, MD, 17.2 mg at 12/05/19 2141 .  sodium phosphate (FLEET) 7-19 GM/118ML enema 1 enema, 1 enema, Rectal, Daily PRN, Bonnielee Haff, MD .  umeclidinium bromide (INCRUSE ELLIPTA) 62.5 MCG/INH 1 puff, 1 puff, Inhalation, Daily, [name], MD, 1 puff at 12/06/19 0810 .  venlafaxine XR (EFFEXOR-XR) 24 hr capsule 150 mg, 150 mg, Oral, Daily, [name], MD, 150 mg at 12/06/19 0943 .  vitamin C (ASCORBIC ACID) tablet 125 mg, 125 mg, Oral, Daily, [name], MD, 125 mg at 12/06/19 3536   Physical Exam: AOx3, FCx4 with left sided neglect, LUE / LLE 0/5 today, but significant component of neglect Incision c/d/i  Assessment & Plan: 74 y.o. 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 MRI brain w/ R convexity 3.5x2.4cm enhancing mass concerning for metastasis versus rapidly growing meningioma. CT CAP without evidence of primary. 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

## 2019-12-06 NOTE — Progress Notes (Signed)
Dr ordered a Psych eval and this nurse had the charge nurse to obtain the Psych iPad from the [facility] and this nurse called to try to reach someone to evaluate her and we kept the iPad on the unit for several hours waiting to a call back but no one called back.  There was no Psych number on [facility] so the educator said the only way to reach them is through the iPad and they would call me back.  Because I did not want anything to happen to the iPad, I did send it back at 1830, after having it here on the unit all day waiting for Psych to call to evaluate her.  She is determined to go home and take care of herself and feels she will get stronger and do more for herself only after she gets home.  This nurse reminded her that she cannot even get up or move around in the bed without assist and she is almost flaccid on her left.  She said she did not care and she is going home anyway.  She definitely needs to be evaluated to determine if she is capable of making her own decisions because she is not making the safest decisions for herself.

## 2019-12-06 NOTE — Progress Notes (Signed)
Pt refusing CPAP for tonight. Advised pt to notify for RT if she changes her mind.

## 2019-12-07 LAB — SARS CORONAVIRUS 2 BY RT PCR (HOSPITAL ORDER, PERFORMED IN ~~LOC~~ HOSPITAL LAB): SARS Coronavirus 2: NEGATIVE

## 2019-12-07 NOTE — Progress Notes (Signed)
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  In the emergency room she was noted to be lethargic.  Evaluation revealed a brain lesion for which she was admitted to the neuro ICU.  She underwent right craniotomy for resection of brain tumor on 11/28/2019.  Biopsy consistent with poorly differentiated carcinoma.  Awaiting placement.   Assessment & Plan:   Active Problems:   Change in mental status   Brain tumor ([place])   Cerebral edema ([facility])   Encephalopathy   HIV disease ([facility])   OSA (obstructive sleep apnea)   Dyslipidemia   History of CVA with residual deficit   Delusional disorder ([place])  Brain tumor with vasogenic edema and mass-effect: Poorly differentiated carcinoma. Status post resection.  Postsurgical stable.  Eliquis resumed.  Patient to follow-up with Dr. [name] after discharge.  Acute metabolic encephalopathy: Resolved.  Chronic hypoxic respiratory failure and obstructive sleep apnea: Using CPAP at night.  Using oxygen 2 to 3 L in the daytime.  Stable.  AKI on CKD stage III: Resolved with IV hydration.  History of PE, thromboembolism: Therapeutic on Eliquis.  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  Work with PT OT.  Hypertension: Blood pressure stable.  HIV: On antiretroviral.  Last CD4 count was more than 700.  Viral count 200.  Well-controlled.   DVT prophylaxis: Eliquis  Code Status: Full code  Family Communication: None  Disposition Plan: Status is: Inpatient  Remains inpatient  appropriate because:Unsafe d/c plan   Dispo: The patient is from: Home              Anticipated d/c is to: SNF when bed available.               Anticipated d/c date is: 1 day              Patient currently is medically stable to d/c.        Procedures:   Craniotomy 6/1  Antimicrobials:   None     Subjective:  Seen and examined.  No new events.  "You are missing a big picture, my house is infested with bedbugs and I need to clear that before I go back home from rehab."  She has hired somebody to do extermination.  Objective: Vitals:   12/07/19 0000 12/07/19 0420 12/07/19 0757 12/07/19 0821  BP: 127/70 117/69  132/66  Pulse: 78 71  74  Resp: 14 14  17   Temp: 98 F (36.7 C) (!) 97.5 F (36.4 C)  98.9 F (37.2 C)  TempSrc: Axillary Axillary  Oral  SpO2: 94% 95% 97% 98%  Weight:      Height:        Intake/Output Summary (Last 24 hours) at 12/07/2019 1155 Last data filed at 12/07/2019 0900 Gross per 24 hour  Intake 560 ml  Output 1200 ml  Net -640 ml   Filed Weights   11/28/19 1650 11/29/19 0448 12/04/19 0500  Weight: 87 kg 75.1 kg 73.3 kg    Examination:  Physical Exam Constitutional:      Appearance: Normal appearance.  Comments: Chronically sick looking.  Not in any distress.  HENT:     Head: Normocephalic.  Cardiovascular:     Rate and Rhythm: Regular rhythm.  Pulmonary:     Breath sounds: Normal breath sounds.     Comments: On 3 L oxygen. Neurological:     Mental Status: She is alert and oriented to person, place, and time.     Motor: No weakness.     Comments: Left upper and lower extremity hemiplegia.      Data Reviewed: I have personally reviewed following labs and imaging studies  CBC: Recent Labs  Lab 12/01/19 1053 12/05/19 0636  WBC 7.5 6.9  NEUTROABS 4.2 2.8  HGB 14.5 14.1  HCT 48.1* 45.7  MCV 92.1 91.2  PLT 234 194   Basic Metabolic Panel: Recent Labs  Lab 12/01/19 1053 12/05/19 0636  NA 138 135  K 4.1 4.5  CL  97* 98  CO2 30 28  GLUCOSE 105* 103*  BUN 19 26*  CREATININE 0.82 0.96  CALCIUM 9.5 9.3   GFR: Estimated Creatinine Clearance: 55.4 mL/min (by C-G formula based on SCr of 0.96 mg/dL). Liver Function Tests: No results for input(s): AST, ALT, ALKPHOS, BILITOT, PROT, ALBUMIN in the last 168 hours. No results for input(s): LIPASE, AMYLASE in the last 168 hours. No results for input(s): AMMONIA in the last 168 hours. Coagulation Profile: No results for input(s): INR, PROTIME in the last 168 hours. Cardiac Enzymes: No results for input(s): CKTOTAL, CKMB, CKMBINDEX, TROPONINI in the last 168 hours. BNP (last 3 results) No results for input(s): PROBNP in the last 8760 hours. HbA1C: No results for input(s): HGBA1C in the last 72 hours. CBG: No results for input(s): GLUCAP in the last 168 hours. Lipid Profile: No results for input(s): CHOL, HDL, LDLCALC, TRIG, CHOLHDL, LDLDIRECT in the last 72 hours. Thyroid Function Tests: No results for input(s): TSH, T4TOTAL, FREET4, T3FREE, THYROIDAB in the last 72 hours. Anemia Panel: No results for input(s): VITAMINB12, FOLATE, FERRITIN, TIBC, IRON, RETICCTPCT in the last 72 hours. Sepsis Labs: No results for input(s): PROCALCITON, LATICACIDVEN in the last 168 hours.  Recent Results (from the past 240 hour(s))  Surgical pcr screen     Status: Abnormal   Collection Time: 11/28/19  6:59 AM   Specimen: Nasal Mucosa; Nasal Swab  Result Value Ref Range Status   MRSA, PCR NEGATIVE NEGATIVE Final   Staphylococcus aureus POSITIVE (A) NEGATIVE Final    Comment: (NOTE) The Xpert SA Assay (FDA approved for NASAL specimens in patients 27 years of age and older), is one component of a comprehensive surveillance program. It is not intended to diagnose infection nor to guide or monitor treatment. Performed at [facility], [place] [address], [place], [place] [address]          Radiology Studies: No results found.      Scheduled Meds:   apixaban  5 mg Oral BID   bictegravir-emtricitabine-tenofovir AF  1 tablet Oral Daily   Chlorhexidine Gluconate Cloth  6 each Topical Daily   docusate sodium  100 mg Oral BID   fluticasone furoate-vilanterol  1 puff Inhalation Daily   gabapentin  800 mg Oral TID   loratadine  10 mg Oral BID   montelukast  10 mg Oral QHS   polyethylene glycol  17 g Oral BID   QUEtiapine Fumarate  150 mg Oral QHS   rOPINIRole  1 mg Oral QHS   senna  2 tablet Oral QHS   umeclidinium  bromide  1 puff Inhalation Daily   venlafaxine XR  150 mg Oral Daily   vitamin C  125 mg Oral Daily   Continuous Infusions:  sodium chloride Stopped (11/29/19 1034)     LOS: 16 days    Time spent: 20 minutes    Barb Merino, MD [facility] Pager [phone]

## 2019-12-07 NOTE — Progress Notes (Signed)
Neurosurgery Service Progress Note  Subjective: No acute events overnight, no new complaints today  Objective: Vitals:   12/06/19 2017 12/07/19 0000 12/07/19 0420 12/07/19 0757  BP: 126/67 127/70 117/69   Pulse: 75 78 71   Resp: 13 14 14    Temp: 97.6 F (36.4 C) 98 F (36.7 C) (!) 97.5 F (36.4 C)   TempSrc: Axillary Axillary Axillary   SpO2: 94% 94% 95% 97%  Weight:      Height:       Temp (24hrs), Avg:97.9 F (36.6 C), Min:97.5 F (36.4 C), Max:98.4 F (36.9 C)  CBC Latest Ref Rng & Units 12/05/2019 12/01/2019 11/30/2019  WBC 4.0 - 10.5 K/uL 6.9 7.5 9.3  Hemoglobin 12.0 - 15.0 g/dL 14.1 14.5 14.4  Hematocrit 36 - 46 % 45.7 48.1(H) 47.8(H)  Platelets 150 - 400 K/uL 278 234 202   BMP Latest Ref Rng & Units 12/05/2019 12/01/2019 11/29/2019  Glucose 70 - 99 mg/dL 103(H) 105(H) 112(H)  BUN 8 - 23 mg/dL 26(H) 19 18  Creatinine 0.44 - 1.00 mg/dL 0.96 0.82 0.93  BUN/Creat Ratio 6 - 22 (calc) - - -  Sodium 135 - 145 mmol/L 135 138 137  Potassium 3.5 - 5.1 mmol/L 4.5 4.1 4.8  Chloride 98 - 111 mmol/L 98 97(L) 101  CO2 22 - 32 mmol/L 28 30 28   Calcium 8.9 - 10.3 mg/dL 9.3 9.5 8.8(L)    Intake/Output Summary (Last 24 hours) at 12/07/2019 0804 Last data filed at 12/07/2019 0302 Gross per 24 hour  Intake 920 ml  Output 1400 ml  Net -480 ml    Current Facility-Administered Medications:    0.9 %  sodium chloride infusion, , Intravenous, PRN, [name], MD, Stopped at 11/29/19 1034   acetaminophen (TYLENOL) tablet 650 mg, 650 mg, Oral, Q6H PRN, 650 mg at 12/03/19 0041 **OR** acetaminophen (TYLENOL) suppository 650 mg, 650 mg, Rectal, Q6H PRN, Myles Rosenthal A, MD   albuterol (PROVENTIL) (2.5 MG/3ML) 0.083% nebulizer solution 2.5 mg, 2.5 mg, Nebulization, Q4H PRN, Clance Boll, MD   apixaban (ELIQUIS) tablet 5 mg, 5 mg, Oral, BID, Swayze, Ava, DO, 5 mg at 12/06/19 2257   bictegravir-emtricitabine-tenofovir AF (BIKTARVY) 50-200-25 MG per tablet 1 tablet, 1 tablet, Oral,  Daily, Myles Rosenthal A, MD, 1 tablet at 12/06/19 6256   Chlorhexidine Gluconate Cloth 2 % PADS 6 each, 6 each, Topical, Daily, [name], MD, 6 each at 12/06/19 0944   diclofenac Sodium (VOLTAREN) 1 % topical gel 4 g, 4 g, Topical, BID PRN, Myles Rosenthal A, MD   docusate sodium (COLACE) capsule 100 mg, 100 mg, Oral, BID, Drea Jurewicz A, MD, 100 mg at 12/06/19 2257   fluticasone furoate-vilanterol (BREO ELLIPTA) 200-25 MCG/INH 1 puff, 1 puff, Inhalation, Daily, Elan Mcelvain, Joyice Faster, MD, 1 puff at 12/07/19 0756   gabapentin (NEURONTIN) capsule 800 mg, 800 mg, Oral, TID, [name], MD, 800 mg at 12/06/19 2256   HYDROcodone-acetaminophen (NORCO/VICODIN) 5-325 MG per tablet 1 tablet, 1 tablet, Oral, Q4H PRN, [name], MD, 1 tablet at 12/06/19 1525   HYDROmorphone (DILAUDID) injection 0.5 mg, 0.5 mg, Intravenous, Q3H PRN, [name], MD, 0.5 mg at 12/06/19 2251   labetalol (NORMODYNE) injection 10-40 mg, 10-40 mg, Intravenous, Q10 min PRN, [name], MD   loratadine (CLARITIN) tablet 10 mg, 10 mg, Oral, BID, Courvoisier Hamblen A, MD, 10 mg at 12/06/19 2257   LORazepam (ATIVAN) injection 1-2 mg, 1-2 mg, Intravenous, Q2H PRN, [name], MD,  2 mg at 12/05/19 0047   montelukast (SINGULAIR) tablet 10 mg, 10 mg, Oral, QHS, Nevaeha Finerty A, MD, 10 mg at 12/06/19 2257   ondansetron (ZOFRAN) tablet 4 mg, 4 mg, Oral, Q4H PRN **OR** ondansetron (ZOFRAN) injection 4 mg, 4 mg, Intravenous, Q4H PRN, [name], MD, 4 mg at 12/05/19 2250   polyethylene glycol (MIRALAX / GLYCOLAX) packet 17 g, 17 g, Oral, BID, Jashley Yellin A, MD, 17 g at 12/06/19 2258   polyethylene glycol (MIRALAX / GLYCOLAX) packet 17 g, 17 g, Oral, Daily PRN, [name], MD, 17 g at 11/30/19 3536   promethazine (PHENERGAN) tablet 12.5-25 mg, 12.5-25 mg, Oral, Q4H PRN, [name], MD   QUEtiapine (SEROQUEL XR) 24 hr tablet 150 mg, 150 mg, Oral,  QHS, Genaro Bekker A, MD, 150 mg at 12/06/19 2255   rOPINIRole (REQUIP) tablet 1 mg, 1 mg, Oral, QHS, Myles Rosenthal A, MD, 1 mg at 12/06/19 2257   senna (SENOKOT) tablet 17.2 mg, 2 tablet, Oral, QHS, Bonnielee Haff, MD, 17.2 mg at 12/06/19 2254   sodium phosphate (FLEET) 7-19 GM/118ML enema 1 enema, 1 enema, Rectal, Daily PRN, Bonnielee Haff, MD   umeclidinium bromide (INCRUSE ELLIPTA) 62.5 MCG/INH 1 puff, 1 puff, Inhalation, Daily, Brodie Correll A, MD, 1 puff at 12/07/19 0756   venlafaxine XR (EFFEXOR-XR) 24 hr capsule 150 mg, 150 mg, Oral, Daily, Llewelyn Sheaffer A, MD, 150 mg at 12/06/19 1443   vitamin C (ASCORBIC ACID) tablet 125 mg, 125 mg, Oral, Daily, [name], MD, 125 mg at 12/06/19 1540   Physical Exam: AOx3, FCx4 with left sided neglect, LUE / LLE 0/5 w/ significant component of neglect Incision c/d/i  Assessment & Plan: 74 y.o. 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 MRI brain w/ R convexity 3.5x2.4cm enhancing mass concerning for metastasis versus rapidly growing meningioma. CT CAP without evidence of primary. 5/27 vCTH done, 6/1 s/p craniotomy for tumor resection, 6/2 MRI with GTR, 6/7 final path poorly differentiated carcinoma, patient aware  -SNF placement pending  [name]  12/07/19 8:04 AM

## 2019-12-07 NOTE — Progress Notes (Signed)
Physical Therapy Treatment Patient Details Name: [name] MRN: [id number] DOB: 1946-06-01 Today's Date: 12/07/2019    History of Present Illness 74yo F admitted with multiple falls prior and found to have enlarging brain lesion with vasogenic edema with mass effect. 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    PT Comments    Pt in bed upon arrival of PT, agreeable to PT session with focus on progression of independence with seated stability. The pt was able to demo good tolerance for seated position , but continues to rely heavily on multimodal cues and external support to maintain upright as pt has tendency for posterior and (L) lateral leans. The pt is able to make corrections to posture with cues, but did not demonstrate initiative to correct positioning without a cue. The pt will continue to benefit from skilled PT to further progress functional strength and motor coordination to facilitate bed mobility and transfers, as well as stability to facilitate independence with self-care both acutely and following d/c.    Follow Up Recommendations  SNF     Equipment Recommendations  Wheelchair (measurements PT);Wheelchair cushion (measurements PT);Hospital bed    Recommendations for Other Services       Precautions / Restrictions Precautions Precautions: Fall Precaution Comments: L hemi Restrictions Weight Bearing Restrictions: No    Mobility  Bed Mobility Overal bed mobility: Needs Assistance Bed Mobility: Supine to Sit;Rolling;Sit to Sidelying Rolling: Mod assist   Supine to sit: Max assist;HOB elevated;+2 for safety/equipment   Sit to sidelying: Max assist;+2 for safety/equipment General bed mobility comments: pt able to initiate roll with  RUE, but benefits from assist to BLE to position and move to EOB. pt then able to push from elevated HOB but immediately required mod/maxA to stabilize in seated position. tends to lean posteriorly and to L  Transfers Overall transfer level: Needs assistance Equipment used: None Transfers: Lateral/Scoot Transfers Sit to Stand: Max assist         General transfer comment: PT was unable to locate stedy on unit for today's session,limited to seated balance activities and lateral scooting at EOB due to safety concerns. Pt was able to scoot laterally to R with modA and use of bed rail but achieves minimal hip clearance.  Ambulation/Gait                 Stairs             Wheelchair Mobility    Modified Rankin (Stroke Patients Only) Modified Rankin (Stroke Patients Only) Pre-Morbid Rankin Score: Moderately severe disability Modified Rankin: Severe disability     Balance Overall balance assessment: Needs assistance Sitting-balance support: Feet supported;Single extremity supported Sitting balance-Leahy Scale: Poor Sitting balance - Comments: pt able to correct balance when given cues, especially tactile cues/targets ("sit forward until your chest touches my hand and hold that position"). modA without UE support. supervision with LUE stabilized at elbow on folded blankets/pillows Postural control: Posterior lean;Left lateral lean Standing balance support: Bilateral upper extremity supported;During functional activity Standing balance-Leahy Scale: Zero                              Cognition Arousal/Alertness: Awake/alert Behavior During Therapy: WFL for tasks assessed/performed Overall  Cognitive Status: Impaired/Different from baseline Area of Impairment: Attention;Memory;Following commands;Safety/judgement;Awareness;Problem solving                   Current Attention Level: Focused Memory: Decreased short-term memory Following Commands: Follows  one step commands inconsistently Safety/Judgement: Decreased awareness of deficits;Decreased awareness of safety Awareness: Intellectual Problem Solving: Requires verbal cues;Requires tactile cues;Slow processing;Decreased initiation;Difficulty sequencing General Comments: Pt able to follow simple commands consistently through session with improved response with multimodal cues. Pt continues to present with L neglect, but is able to correct instability when cued      Exercises      General Comments General comments (skin integrity, edema, etc.): pt on 4L O2 with VSS      Pertinent Vitals/Pain Pain Assessment: No/denies pain Pain Intervention(s): Monitored during session;Repositioned    Home Living                      Prior Function            PT Goals (current goals can now be found in the care plan section) Acute Rehab PT Goals Patient Stated Goal: get better and get home PT Goal Formulation: With patient Time For Goal Achievement: 12/13/19 Potential to Achieve Goals: Fair Progress towards PT goals: Progressing toward goals    Frequency    Min 3X/week      PT Plan Current plan remains appropriate    Co-evaluation              AM-PAC PT "6 Clicks" Mobility   Outcome Measure  Help needed turning from your back to your side while in a flat bed without using bedrails?: A Lot Help needed moving from lying on your back to sitting on the side of a flat bed without using bedrails?: A Lot Help needed moving to and from a bed to a chair (including a wheelchair)?: Total Help needed standing up from a chair using your arms (e.g., wheelchair or bedside chair)?: A Lot Help needed to walk in hospital room?: Total Help needed climbing 3-5 steps with a railing? : Total 6 Click Score: 9    End of Session Equipment Utilized During Treatment: Oxygen;Gait belt Activity Tolerance: Patient tolerated treatment well Patient left: with call bell/phone within reach;in  bed;with bed alarm set Nurse Communication: Mobility status;Need for lift equipment PT Visit Diagnosis: Unsteadiness on feet (R26.81);Muscle weakness (generalized) (M62.81);Other symptoms and signs involving the nervous system (R29.898)     Time: 8916-9450 PT Time Calculation (min) (ACUTE ONLY): 34 min  Charges:  $Therapeutic Activity: 23-37 mins                    Karma Ganja, PT, DPT   Acute Rehabilitation Department Pager #: [phone]   Otho Bellows 12/07/2019, 4:23 PM

## 2019-12-08 NOTE — Progress Notes (Signed)
Report given to [place], LPN at [facility].  AVS information related to receiving nurse, telemetry discontinued, peripheral IV removed, awaiting PTAR.

## 2019-12-08 NOTE — Discharge Instructions (Signed)
Discharge Instructions  No restriction in activities, slowly increase your activity back to normal.   Your incision is closed with absorbable sutures. These will naturally fall off over the next 4-6 weeks. If they become bothersome or cause discomfort, apply some antibiotic ointment like bacitracin or neosporin on the sutures. This will soften them up and usually makes them more comfortable while they dissolve.  Okay to shower on the day of discharge. Be gentle when cleaning your incision. Use regular soap and water. If that is uncomfortable, try using baby shampoo. Do not submerge the wound under water for 2 weeks after surgery.  Follow up with Dr. Zada Finders in 2 weeks after discharge. If you do not already have a discharge appointment, please call his office at [phone] to schedule a follow up appointment. If you have any concerns or questions, please call the office and let us know.  The tumor that was removed from your brain is concerning for a malignancy, which would be a recurrence of your lung cancer. It is very important that you follow up with your oncologist, Dr. [name], to help determine where this tumor came from and how to treat it. Call his office to schedule a follow up appointment after discharge from the hospital.

## 2019-12-08 NOTE — Progress Notes (Signed)
Neurosurgery Service Progress Note  Subjective: No acute events overnight, no new complaints today  Objective: Vitals:   12/07/19 2320 12/08/19 0016 12/08/19 0334 12/08/19 0754  BP: 117/68  114/62 (!) 97/59  Pulse: 66 73 68 69  Resp: 19 13 16 15   Temp: 98.4 F (36.9 C)  98 F (36.7 C) 98.2 F (36.8 C)  TempSrc: Oral   Oral  SpO2: 93%  94% 100%  Weight:      Height:       Temp (24hrs), Avg:98.3 F (36.8 C), Min:97.6 F (36.4 C), Max:98.9 F (37.2 C)  CBC Latest Ref Rng & Units 12/05/2019 12/01/2019 11/30/2019  WBC 4.0 - 10.5 K/uL 6.9 7.5 9.3  Hemoglobin 12.0 - 15.0 g/dL 14.1 14.5 14.4  Hematocrit 36 - 46 % 45.7 48.1(H) 47.8(H)  Platelets 150 - 400 K/uL 278 234 202   BMP Latest Ref Rng & Units 12/05/2019 12/01/2019 11/29/2019  Glucose 70 - 99 mg/dL 103(H) 105(H) 112(H)  BUN 8 - 23 mg/dL 26(H) 19 18  Creatinine 0.44 - 1.00 mg/dL 0.96 0.82 0.93  BUN/Creat Ratio 6 - 22 (calc) - - -  Sodium 135 - 145 mmol/L 135 138 137  Potassium 3.5 - 5.1 mmol/L 4.5 4.1 4.8  Chloride 98 - 111 mmol/L 98 97(L) 101  CO2 22 - 32 mmol/L 28 30 28   Calcium 8.9 - 10.3 mg/dL 9.3 9.5 8.8(L)    Intake/Output Summary (Last 24 hours) at 12/08/2019 0803 Last data filed at 12/07/2019 2300 Gross per 24 hour  Intake 560 ml  Output 300 ml  Net 260 ml    Current Facility-Administered Medications:  .  0.9 %  sodium chloride infusion, , Intravenous, PRN, [name], MD, Stopped at 11/29/19 1034 .  acetaminophen (TYLENOL) tablet 650 mg, 650 mg, Oral, Q6H PRN, 650 mg at 12/07/19 2012 **OR** acetaminophen (TYLENOL) suppository 650 mg, 650 mg, Rectal, Q6H PRN, Myles Rosenthal A, MD .  albuterol (PROVENTIL) (2.5 MG/3ML) 0.083% nebulizer solution 2.5 mg, 2.5 mg, Nebulization, Q4H PRN, Myles Rosenthal A, MD .  apixaban (ELIQUIS) tablet 5 mg, 5 mg, Oral, BID, Swayze, Ava, DO, 5 mg at 12/07/19 2303 .  bictegravir-emtricitabine-tenofovir AF (BIKTARVY) 50-200-25 MG per tablet 1 tablet, 1 tablet, Oral, Daily,  Clance Boll, MD, 1 tablet at 12/07/19 0850 .  Chlorhexidine Gluconate Cloth 2 % PADS 6 each, 6 each, Topical, Daily, [name], MD, 6 each at 12/07/19 [phone] .  diclofenac Sodium (VOLTAREN) 1 % topical gel 4 g, 4 g, Topical, BID PRN, Myles Rosenthal A, MD .  docusate sodium (COLACE) capsule 100 mg, 100 mg, Oral, BID, [name], MD, 100 mg at 12/07/19 2304 .  fluticasone furoate-vilanterol (BREO ELLIPTA) 200-25 MCG/INH 1 puff, 1 puff, Inhalation, Daily, [name], MD, 1 puff at 12/07/19 0756 .  gabapentin (NEURONTIN) capsule 800 mg, 800 mg, Oral, TID, [name], MD, 800 mg at 12/07/19 2303 .  HYDROcodone-acetaminophen (NORCO/VICODIN) 5-325 MG per tablet 1 tablet, 1 tablet, Oral, Q4H PRN, [name], MD, 1 tablet at 12/07/19 1603 .  HYDROmorphone (DILAUDID) injection 0.5 mg, 0.5 mg, Intravenous, Q3H PRN, [name], MD, 0.5 mg at 12/07/19 2301 .  labetalol (NORMODYNE) injection 10-40 mg, 10-40 mg, Intravenous, Q10 min PRN, [name], MD .  loratadine (CLARITIN) tablet 10 mg, 10 mg, Oral, BID, [name], MD, 10 mg at 12/07/19 2304 .  LORazepam (ATIVAN) injection 1-2 mg, 1-2 mg, Intravenous, Q2H PRN, [name], MD,  2 mg at 12/05/19 0047 .  montelukast (SINGULAIR) tablet 10 mg, 10 mg, Oral, QHS, Trevelle Mcgurn, Joyice Faster, MD, 10 mg at 12/07/19 2302 .  ondansetron (ZOFRAN) tablet 4 mg, 4 mg, Oral, Q4H PRN **OR** ondansetron (ZOFRAN) injection 4 mg, 4 mg, Intravenous, Q4H PRN, [name], MD, 4 mg at 12/05/19 2250 .  polyethylene glycol (MIRALAX / GLYCOLAX) packet 17 g, 17 g, Oral, BID, [name], MD, 17 g at 12/07/19 2304 .  polyethylene glycol (MIRALAX / GLYCOLAX) packet 17 g, 17 g, Oral, Daily PRN, [name], MD, 17 g at 11/30/19 5170 .  promethazine (PHENERGAN) tablet 12.5-25 mg, 12.5-25 mg, Oral, Q4H PRN, [name], MD .  QUEtiapine (SEROQUEL XR) 24 hr tablet 150 mg, 150 mg,  Oral, QHS, Bernadetta Roell, Joyice Faster, MD, 150 mg at 12/07/19 2302 .  rOPINIRole (REQUIP) tablet 1 mg, 1 mg, Oral, QHS, Myles Rosenthal A, MD, 1 mg at 12/07/19 2303 .  senna (SENOKOT) tablet 17.2 mg, 2 tablet, Oral, QHS, Bonnielee Haff, MD, 17.2 mg at 12/07/19 2302 .  sodium phosphate (FLEET) 7-19 GM/118ML enema 1 enema, 1 enema, Rectal, Daily PRN, Bonnielee Haff, MD .  umeclidinium bromide (INCRUSE ELLIPTA) 62.5 MCG/INH 1 puff, 1 puff, Inhalation, Daily, [name], MD, 1 puff at 12/07/19 0756 .  venlafaxine XR (EFFEXOR-XR) 24 hr capsule 150 mg, 150 mg, Oral, Daily, [name], MD, 150 mg at 12/07/19 0849 .  vitamin C (ASCORBIC ACID) tablet 125 mg, 125 mg, Oral, Daily, [name], MD, 125 mg at 12/07/19 0850   Physical Exam: AOx3, FCx4 with left sided neglect, LUE / LLE 0/5 w/ sig component of neglect Incision c/d/i  Assessment & Plan: 74 y.o. 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 MRI brain w/ R convexity 3.5x2.4cm enhancing mass concerning for metastasis versus rapidly growing meningioma. CT CAP without evidence of primary. 5/27 vCTH done, 6/1 s/p craniotomy for tumor resection, 6/2 MRI with GTR, 6/7 final path poorly differentiated carcinoma, patient aware  -SNF placement pending  [name]  12/08/19 8:03 AM

## 2019-12-08 NOTE — Progress Notes (Signed)
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  In the emergency room she was noted to be lethargic.  Evaluation revealed a brain lesion for which she was admitted to the neuro ICU.  She underwent right craniotomy for resection of brain tumor on 11/28/2019.  Biopsy consistent with poorly differentiated carcinoma.  Awaiting placement.   Assessment & Plan:   Active Problems:   Change in mental status   Brain tumor ([place])   Cerebral edema ([facility])   Encephalopathy   HIV disease ([facility])   OSA (obstructive sleep apnea)   Dyslipidemia   History of CVA with residual deficit   Delusional disorder ([place])  Brain tumor with vasogenic edema and mass-effect: Poorly differentiated carcinoma. Status post resection.  Postsurgical stable.  Eliquis resumed.  Patient to follow-up with Dr. [name] after discharge.  Acute metabolic encephalopathy: Resolved.  Chronic hypoxic respiratory failure and obstructive sleep apnea: Using CPAP at night.  Using oxygen 2 to 3 L in the daytime.  Stable.  AKI on CKD stage III: Resolved with IV hydration.  History of PE, thromboembolism: Therapeutic on Eliquis.  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  Work with PT OT.  Hypertension: Blood pressure stable.  HIV: On antiretroviral.  Last CD4 count was more than 700.  Viral count 200.  Well-controlled.   DVT prophylaxis: Eliquis  Code Status: Full code  Family Communication: None  Disposition Plan: Status is: Inpatient  Remains inpatient  appropriate because:Unsafe d/c plan   Dispo: The patient is from: Home              Anticipated d/c is to: SNF when bed available.               Anticipated d/c date is: 1 day              Patient currently is medically stable to d/c.        Procedures:   Craniotomy 6/1  Antimicrobials:   None     Subjective: No new events.  No new complaints.  Objective: Vitals:   12/08/19 0016 12/08/19 0334 12/08/19 0754 12/08/19 0816  BP:  114/62 (!) 97/59   Pulse: 73 68 69   Resp: 13 16 15    Temp:  98 F (36.7 C) 98.2 F (36.8 C)   TempSrc:   Oral   SpO2:  94% 100% 100%  Weight:      Height:        Intake/Output Summary (Last 24 hours) at 12/08/2019 1122 Last data filed at 12/07/2019 2300 Gross per 24 hour  Intake 240 ml  Output --  Net 240 ml   Filed Weights   11/28/19 1650 11/29/19 0448 12/04/19 0500  Weight: 87 kg 75.1 kg 73.3 kg    Examination:  Physical Exam Constitutional:      Appearance: Normal appearance.     Comments: Chronically sick looking.  Not in any distress.  HENT:     Head: Normocephalic.  Cardiovascular:     Rate and Rhythm: Regular rhythm.  Pulmonary:     Breath sounds:  Normal breath sounds.     Comments: On 3 L oxygen. Neurological:     Mental Status: She is alert and oriented to person, place, and time.     Motor: No weakness.     Comments: Left upper and lower extremity hemiplegia.      Data Reviewed: I have personally reviewed following labs and imaging studies  CBC: Recent Labs  Lab 12/05/19 0636  WBC 6.9  NEUTROABS 2.8  HGB 14.1  HCT 45.7  MCV 91.2  PLT 423   Basic Metabolic Panel: Recent Labs  Lab 12/05/19 0636  NA 135  K 4.5  CL 98  CO2 28  GLUCOSE 103*  BUN 26*  CREATININE 0.96  CALCIUM 9.3   GFR: Estimated Creatinine Clearance: 55.4 mL/min (by C-G formula based on SCr of 0.96 mg/dL). Liver Function Tests: No results for input(s): AST, ALT, ALKPHOS, BILITOT, PROT, ALBUMIN in the last 168 hours. No  results for input(s): LIPASE, AMYLASE in the last 168 hours. No results for input(s): AMMONIA in the last 168 hours. Coagulation Profile: No results for input(s): INR, PROTIME in the last 168 hours. Cardiac Enzymes: No results for input(s): CKTOTAL, CKMB, CKMBINDEX, TROPONINI in the last 168 hours. BNP (last 3 results) No results for input(s): PROBNP in the last 8760 hours. HbA1C: No results for input(s): HGBA1C in the last 72 hours. CBG: No results for input(s): GLUCAP in the last 168 hours. Lipid Profile: No results for input(s): CHOL, HDL, LDLCALC, TRIG, CHOLHDL, LDLDIRECT in the last 72 hours. Thyroid Function Tests: No results for input(s): TSH, T4TOTAL, FREET4, T3FREE, THYROIDAB in the last 72 hours. Anemia Panel: No results for input(s): VITAMINB12, FOLATE, FERRITIN, TIBC, IRON, RETICCTPCT in the last 72 hours. Sepsis Labs: No results for input(s): PROCALCITON, LATICACIDVEN in the last 168 hours.  Recent Results (from the past 240 hour(s))  SARS Coronavirus 2 by RT PCR (hospital order, performed in [facility] hospital lab) Nasopharyngeal Nasopharyngeal Swab     Status: None   Collection Time: 12/07/19 12:22 PM   Specimen: Nasopharyngeal Swab  Result Value Ref Range Status   SARS Coronavirus 2 NEGATIVE NEGATIVE Final    Comment: (NOTE) SARS-CoV-2 target nucleic acids are NOT DETECTED.  The SARS-CoV-2 RNA is generally detectable in upper and lower respiratory specimens during the acute phase of infection. The lowest concentration of SARS-CoV-2 viral copies this assay can detect is 250 copies / mL. A negative result does not preclude SARS-CoV-2 infection and should not be used as the sole basis for treatment or other patient management decisions.  A negative result may occur with improper specimen collection / handling, submission of specimen other than nasopharyngeal swab, presence of viral mutation(s) within the areas targeted by this assay, and inadequate number of viral  copies (<250 copies / mL). A negative result must be combined with clinical observations, patient history, and epidemiological information.  Fact Sheet for Patients:   StrictlyIdeas.no  Fact Sheet for Healthcare Providers: BankingDealers.co.za  This test is not yet approved or  cleared by the [place] FDA and has been authorized for detection and/or diagnosis of SARS-CoV-2 by FDA under an Emergency Use Authorization (EUA).  This EUA will remain in effect (meaning this test can be used) for the duration of the COVID-19 declaration under Section 564(b)(1) of the Act, 21 U.S.C. section 360bbb-3(b)(1), unless the authorization is terminated or revoked sooner.  Performed at [facility], [place] [address] [place], [place] [address]  Radiology Studies: No results found.      Scheduled Meds: . apixaban  5 mg Oral BID  . bictegravir-emtricitabine-tenofovir AF  1 tablet Oral Daily  . Chlorhexidine Gluconate Cloth  6 each Topical Daily  . docusate sodium  100 mg Oral BID  . fluticasone furoate-vilanterol  1 puff Inhalation Daily  . gabapentin  800 mg Oral TID  . loratadine  10 mg Oral BID  . montelukast  10 mg Oral QHS  . polyethylene glycol  17 g Oral BID  . QUEtiapine Fumarate  150 mg Oral QHS  . rOPINIRole  1 mg Oral QHS  . senna  2 tablet Oral QHS  . umeclidinium bromide  1 puff Inhalation Daily  . venlafaxine XR  150 mg Oral Daily  . vitamin C  125 mg Oral Daily   Continuous Infusions: . sodium chloride Stopped (11/29/19 1034)     LOS: 17 days    Time spent: 20 minutes    Barb Merino, MD [facility] Pager [phone]

## 2019-12-08 NOTE — TOC Transition Note (Signed)
Transition of Care [facility]) - CM/SW Discharge Note   Patient Details  Name: [name] MRN: [id number] Date of Birth: [birth year]  [facility] CM/SW Contact:  Geralynn Ochs, LCSW Phone Number: 12/08/2019, 3:01 PM   Clinical Narrative:   Nurse to call report to [phone].    Final next level of care: Skilled Nursing Facility Barriers to Discharge: Barriers Resolved   Patient Goals and CMS Choice Patient states their goals for this hospitalization and ongoing recovery are:: agreeable to SNF CMS Medicare.gov Compare Post Acute Care list provided to:: Patient Choice offered to / list presented to : Patient  Discharge Placement              Patient chooses bed at: [facility] Patient to be transferred to facility by: [place] Name of family member notified: Billy Patient and family notified of of transfer: 12/08/19  Discharge Plan and Services In-house Referral: Clinical Social Work Discharge Planning Services: [employer] Consult Post Acute Care Choice: [place]          DME Arranged: N/A DME Agency: NA       HH Arranged: NA HH Agency: NA        Social Determinants of Health (SDOH) Interventions     Readmission Risk Interventions No flowsheet data found.

## 2019-12-08 NOTE — Progress Notes (Signed)
[facility] arrived to transport pt to SNF.

## 2019-12-11 ENCOUNTER — Inpatient Hospital Stay: Payer: Medicare Other | Attending: Radiation Oncology

## 2019-12-12 ENCOUNTER — Ambulatory Visit
Admit: 2019-12-12 | Discharge: 2019-12-12 | Disposition: A | Discharge status: Home / Self Care | Payer: Medicare Other | Attending: Radiation Oncology | Admitting: Radiation Oncology

## 2019-12-12 ENCOUNTER — Other Ambulatory Visit: Payer: Self-pay

## 2019-12-12 ENCOUNTER — Ambulatory Visit
Admission: RE | Admit: 2019-12-12 | Discharge: 2019-12-12 | Disposition: A | Discharge status: Home / Self Care | Payer: Medicare Other | Source: Ambulatory Visit | Attending: Radiation Oncology | Admitting: Radiation Oncology

## 2019-12-12 ENCOUNTER — Other Ambulatory Visit: Payer: Self-pay | Admitting: Radiation Therapy

## 2019-12-12 ENCOUNTER — Encounter: Payer: Self-pay | Admitting: Radiation Oncology

## 2019-12-12 DIAGNOSIS — C7931 Secondary malignant neoplasm of brain: Secondary | ICD-10-CM

## 2019-12-12 NOTE — Progress Notes (Signed)
Location/Histology of Brain Tumor: Right Brain- posterior Fossa  History of LUL adenocarcinoma  Patient presented with symptoms of:  She had a fall at home and was down for prolonged period of time.  MRI Brain 11/29/2019: Status post resection of right convexity meningioma without residual nodular contrast enhancement.   CT Head 11/23/2019: Right convexity extra-axial mass compatible with meningioma. This abuts the falx and superior sagittal sinus. Intravenous contrast not administered on the CT. There is moderate amount of vasogenic edema in the right frontal and parietal lobes. No change from recent studies.  CT CAP 11/22/2019: No evidence of primary or metastatic disease within the chest, abdomen, or pelvis.  Unchanged bandlike scarring of the left upper lobe, in keeping with prior radiation therapy. No evidence of recurrent lung malignancy.  MRI Brain 11/21/2019: Extra axial heterogeneously enhancing lesion is present at the vertex on the right making broad dural contact with the superior frontoparietal convexity and falx.  This likely reflects progression of small extra axial lesion seen on the 2020 study.  Lesion currently measures approximately 4.5 x 3.6 x 2.4 cm.  CT Head 11/21/2019: Large isodense mass at the right apex with vasogenic edema.  Remote right MCA branch infarct.  Past or anticipated interventions, if any, per neurosurgery:  Dr. Zada Finders 11/28/2019 -Right craniotomy for tumor resection   Biopsy: Right Craniotomy 11/28/2019   Past or anticipated interventions, if any, per medical oncology:   Dose of Decadron, if applicable: No  Recent neurologic symptoms, if any:   Seizures:   Headaches: Yes, taking tramadol and tylenol  Nausea: No  Dizziness/ataxia: No  Difficulty with hand coordination: Left hand feels numb, states it does not work right.  Focal numbness/weakness: Tingling in her feet and hands.  Left had she states she does not have much use of it.  Left hand  feels numb.  Visual deficits/changes: No  Confusion/Memory deficits: No    SAFETY ISSUES:  Prior radiation?  SBRT LUL 7/31-02/02/2014, Dr. Lisbeth Renshaw  Pacemaker/ICD? No  Possible current pregnancy? Postmenopausal  Is the patient on methotrexate? No  Additional Complaints / other details:  -Living at [place]

## 2019-12-12 NOTE — Progress Notes (Signed)
Radiation Oncology         [phone] ________________________________  Initial Outpatient Consultation - Conducted via telephone due to current COVID-19 concerns for limiting patient exposure  I spoke with the patient to conduct this consult visit via telephone to spare the patient unnecessary potential exposure in the healthcare setting during the current COVID-19 pandemic. The patient was notified in advance and was offered a [place] meeting to allow for face to face communication but unfortunately reported that they did not have the appropriate resources/technology to support such a visit and instead preferred to proceed with a telephone consult.    Name: [name]        MRN: [id number]  Date of Service: 12/12/2019 DOB: 01/17/1946  DX:[id number], Christean Grief, MD  [name], MD     REFERRING PHYSICIAN: [name], MD  DIAGNOSIS: Recurrent Metastatic Lung Cancer  HISTORY OF PRESENT ILLNESS: [name] is a 74 y.o. female seen at the request of Dr. Zada Finders for newly noted disease in the brain. The patient has a history of a Stage IA, cT1bN0M0 NSCLC, adenocarcinoma of the LUL. She was not a good surgical candidate for lobectomy, and subsequently completed stereotactic body radiotherapy (SBRT). She was to be followed with Dr. [name] but she no showed and was lost to follow up. She recently presented after a fall at home  And was found after a prolonged period of being down. She was hypoxic and had altered mental status and a CT was obtained in the ED on 11/21/19 which revealed concerns for a dural based mass at the vertex. MRI on 11/21/19 revealed a mass in the right vertex measuring 4.5 x 3.6 x 2.4 cm. A staging CT of the CAP was negative for a primary site, and she was taken to the OR on 11/28/19 for right craniotomy and resection of the tumor. She had a gross total resection by postop MRI, and final pathology confirmed poorly differentiated carcinoma, felt to be consistent  with a primary lung adenocarcinoma.    PREVIOUS RADIATION THERAPY: Yes  01/26/14-02/02/14 SBRT:  LUL was treated to 54 Gy in 3 fractions.   PAST MEDICAL HISTORY:  Past Medical History:  Diagnosis Date  . Asthma   . Atypical squamous cells of undetermined significance (ASCUS) on Papanicolaou smear of cervix   . Bed bug bite 07/12/2019  . COPD (chronic obstructive pulmonary disease) ([place])   . H/O drainage of abscess    left axilla, from left breast  . Hep B w/o coma   . HIV (human immunodeficiency virus infection) ([place])   . Hyperlipidemia   . Lung cancer ([place]) 12/14/13   LUL Adenocarcinoma  . Neuromuscular disorder ([place])    fingers/feet neuropathy  . Non-small cell lung cancer ([place])   . S/P radiation therapy 01/26/14-02/02/14   sbrt  lt upper lung-54Gy/84fx       PAST SURGICAL HISTORY: Past Surgical History:  Procedure Laterality Date  . ABCESS DRAINAGE     abcess under Left arm, abcess from L breast  . APPLICATION OF CRANIAL NAVIGATION Right 11/28/2019   Procedure: APPLICATION OF CRANIAL NAVIGATION;  Surgeon: [name], MD;  Location: [place];  Service: Neurosurgery;  Laterality: Right;  . BUBBLE STUDY  03/08/2019   Procedure: BUBBLE STUDY;  Surgeon: [name], MD;  Location: [place];  Service: Cardiovascular;;  . CRANIOTOMY Right 11/28/2019   Procedure: Right Craniotomy for Tumor Resection;  Surgeon: [name], MD;  Location: [place];  Service: Neurosurgery;  Laterality: Right;  . removal of abnormal cells on uterus    . TEE WITHOUT CARDIOVERSION N/A 03/08/2019   Procedure: TRANSESOPHAGEAL ECHOCARDIOGRAM (TEE);  Surgeon: [name], MD;  Location: [facility] ENDOSCOPY;  Service: Cardiovascular;  Laterality: N/A;  . thyroid gland removed       FAMILY HISTORY:  Family History  Problem Relation Age of Onset  . Pneumonia Mother   . Hypertension Sister   . Diabetes Sister   . Cancer Sister        pancreatic and lung  . Cancer Brother        pancreatic  .  Cancer Brother        pancreatic     SOCIAL HISTORY:  reports that she has been smoking cigarettes. She has a 40.00 pack-year smoking history. She has never used smokeless tobacco. She reports that she does not drink alcohol and does not use drugs. The patient is single and lives in [place]. Her brother BJ joined [name] on the call as well.   ALLERGIES: Patient has no known allergies.   MEDICATIONS:  Current Outpatient Medications  Medication Sig Dispense Refill  . albuterol (PROVENTIL HFA;VENTOLIN HFA) 108 (90 BASE) MCG/ACT inhaler Inhale 1 puff into the lungs every 6 (six) hours as needed for shortness of breath.     Marland Kitchen albuterol (PROVENTIL) (2.5 MG/3ML) 0.083% nebulizer solution Take 3 mLs (2.5 mg total) by nebulization every 4 (four) hours as needed for wheezing or shortness of breath. 30 mL 0  . apixaban (ELIQUIS) 5 MG TABS tablet Take 1 tablet (5 mg total) by mouth 2 (two) times daily. 60 tablet 1  . Ascorbic Acid (VITAMIN C) 100 MG tablet Take 100 mg by mouth daily.    . B Complex-Biotin-FA (VITAMIN B50 COMPLEX PO) Take 1 tablet by mouth daily.    . bictegravir-emtricitabine-tenofovir AF (BIKTARVY) 50-200-25 MG TABS tablet Take 1 tablet by mouth daily. 30 tablet 11  . diclofenac Sodium (VOLTAREN) 1 % GEL Apply 4 g topically 2 (two) times daily as needed (pain).     . Fluticasone-Salmeterol (ADVAIR) 500-50 MCG/DOSE AEPB Inhale 1 puff into the lungs every 12 (twelve) hours.    . gabapentin (NEURONTIN) 600 MG tablet Take 600 mg by mouth 3 (three) times daily.    Marland Kitchen gabapentin (NEURONTIN) 800 MG tablet Take 800 mg by mouth 3 (three) times daily.    . hydrOXYzine (ATARAX/VISTARIL) 25 MG tablet Take 1 tablet (25 mg total) by mouth every 6 (six) hours. 12 tablet 0  . ibuprofen (ADVIL) 200 MG tablet Take 800 mg by mouth every 6 (six) hours as needed for moderate pain.    Marland Kitchen loratadine (CLARITIN) 10 MG tablet Take 10 mg by mouth 2 (two) times daily.     . montelukast (SINGULAIR) 10 MG tablet  Take 1 tablet (10 mg total) by mouth at bedtime. 30 tablet 6  . permethrin (ELIMITE) 5 % cream See admin instructions. apply to all body parts from neck to feet and wash off after 12 hours. May repeat in 7 days prn    . QUEtiapine Fumarate (SEROQUEL XR) 150 MG 24 hr tablet Take 150 mg by mouth at bedtime.    Marland Kitchen rOPINIRole (REQUIP) 1 MG tablet Take 1 tablet (1 mg total) by mouth at bedtime. 30 tablet 1  . SPIRIVA RESPIMAT 2.5 MCG/ACT AERS INHALE 2 PUFFS INTO THE LUNGS DAILY (Patient taking differently: Inhale 2 puffs into the lungs daily. ) 1 Inhaler 0  . tiZANidine (ZANAFLEX) 4 MG tablet  Take 4 mg by mouth 2 (two) times daily as needed for muscle spasms.     . traMADol (ULTRAM) 50 MG tablet Take 50 mg by mouth every 8 (eight) hours as needed.    . triamcinolone ointment (KENALOG) 0.5 % Apply 1 application topically 2 (two) times daily. 30 g 1  . venlafaxine XR (EFFEXOR-XR) 150 MG 24 hr capsule Take 150 mg by mouth daily.    Marland Kitchen VITAMIN A PO Take 1 tablet by mouth daily.     No current facility-administered medications for this encounter.     REVIEW OF SYSTEMS: On review of systems, the patient reports that she is doing well since her surgery and no headaches or visual changes have been noted. No other complaints are verbalized.     PHYSICAL EXAM:  Wt Readings from Last 3 Encounters:  12/04/19 161 lb 9.6 oz (73.3 kg)  08/01/19 185 lb (83.9 kg)  07/12/19 186 lb (84.4 kg)   Unable to assess due to encounter type.  ECOG = 0  0 - Asymptomatic (Fully active, able to carry on all predisease activities without restriction)  1 - Symptomatic but completely ambulatory (Restricted in physically strenuous activity but ambulatory and able to carry out work of a light or sedentary nature. For example, light housework, office work)  2 - Symptomatic, <50% in bed during the day (Ambulatory and capable of all self care but unable to carry out any work activities. Up and about more than 50% of waking  hours)  3 - Symptomatic, >50% in bed, but not bedbound (Capable of only limited self-care, confined to bed or chair 50% or more of waking hours)  4 - Bedbound (Completely disabled. Cannot carry on any self-care. Totally confined to bed or chair)  5 - Death   Eustace Pen MM, Creech RH, Tormey DC, et al. [phone]). "Toxicity and response criteria of the [facility]". [place] Oncol. 5 (6): 649-55    LABORATORY DATA:  Lab Results  Component Value Date   WBC 6.9 12/05/2019   HGB 14.1 12/05/2019   HCT 45.7 12/05/2019   MCV 91.2 12/05/2019   PLT 278 12/05/2019   Lab Results  Component Value Date   NA 135 12/05/2019   K 4.5 12/05/2019   CL 98 12/05/2019   CO2 28 12/05/2019   Lab Results  Component Value Date   ALT 13 11/22/2019   AST 20 11/22/2019   ALKPHOS 42 11/22/2019   BILITOT 0.1 (L) 11/22/2019      RADIOGRAPHY: EEG  Result Date: 11/22/2019 Lora Havens, MD     11/22/2019 11:53 AM Patient Name: AMARIANNA ABPLANALP MRN: [id number] Epilepsy Attending: Lora Havens Referring Physician/Provider: Dr. Clarise Cruz- Roda Shutters Date: 11/22/2019 Duration: 23.46 minutes Patient history: 74 year old female who presented after being found down.  EEG evaluate for seizures. Level of alertness: Awake, drowsy, sleep, comatose, lethargic AEDs during EEG study: Gabapentin Technical aspects: This EEG study was done with scalp electrodes positioned according to the 10-20 International system of electrode placement. Electrical activity was acquired at a sampling rate of 500Hz  and reviewed with a high frequency filter of 70Hz  and a low frequency filter of 1Hz . EEG data were recorded continuously and digitally stored. Description: The posterior dominant rhythm consists of 8-9 Hz activity of moderate voltage (25-35 uV) seen predominantly in posterior head regions, symmetric and reactive to eye opening and eye closing. Hyperventilation and photic stimulation were not performed.   IMPRESSION:  This study is within  normal limits. No seizures or epileptiform discharges were seen throughout the recording. Lora Havens   DG Cervical Spine Complete  Result Date: 11/21/2019 CLINICAL DATA:  Pain following fall EXAM: CERVICAL SPINE - COMPLETE 4+ VIEW COMPARISON:  None. FINDINGS: Frontal, lateral, open-mouth odontoid, and bilateral oblique views were obtained, all with patient in cervical collar. There is no appreciable fracture or spondylolisthesis. Prevertebral soft tissues and predental space regions are normal. There is moderately severe disc space narrowing at C6-7. There is fairly mild narrowing at C5-6. There are prominent anterior osteophytes at C5, C6, and C7. There is facet hypertrophy with exit foraminal narrowing at all levels except for C2-3. Lung apices are clear. IMPRESSION: Multilevel osteoarthritic change. No fracture or spondylolisthesis. Note that no attempt to assess for potential ligamentous injury can be made with in collar only images. Electronically Signed   By: Lowella Grip III M.D.   On: 11/21/2019 08:20   DG Lumbar Spine Complete  Result Date: 11/21/2019 CLINICAL DATA:  74 year old female status post fall out of bed with low back pain radiating to the left hip. EXAM: LUMBAR SPINE - COMPLETE 4+ VIEW COMPARISON:  Recent lumbar radiographs 11/18/2019. FINDINGS: Normal lumbar segmentation aside from small unfused transverse process ossification centers at L1. Stable mild grade 1 anterolisthesis of L4 on L5. Moderate and severe lumbar facet hypertrophy, maximal at L4-L5 and L5-S1. Vacuum disc and endplate spurring at G1-W2. Stable vertebral height and alignment. No acute osseous abnormality identified. No pars fracture. Sacrum and SI joints appear grossly intact. Visible pelvis appears grossly intact. Negative abdominal visceral contours. IMPRESSION: 1. No acute osseous abnormality identified in the lumbar spine. 2. Degenerative grade 1 anterolisthesis of L4 on L5 with  advanced facet arthropathy. Severe chronic disc degeneration at L5-S1. Electronically Signed   By: Genevie Ann M.D.   On: 11/21/2019 07:53   DG Lumbar Spine Complete  Result Date: 11/18/2019 CLINICAL DATA:  [place], left-sided pain EXAM: LUMBAR SPINE - COMPLETE 4+ VIEW COMPARISON:  None. FINDINGS: Frontal, bilateral oblique, lateral views of the lumbar spine are obtained. Five non-rib-bearing lumbar type vertebral bodies are in anatomic alignment. There are no acute displaced fractures. Diffuse facet hypertrophy is identified at all levels. There is significant disc space narrowing and osteophyte formation at L5/S1. Sacroiliac joints are normal. IMPRESSION: 1. No acute fracture. 2. Spondylosis at L5/S1. 3. Diffuse facet hypertrophy. Electronically Signed   By: Randa Ngo M.D.   On: 11/18/2019 21:48   DG Pelvis 1-2 Views  Result Date: 11/18/2019 CLINICAL DATA:  [place], pelvic pain EXAM: PELVIS - 1-2 VIEW COMPARISON:  None. FINDINGS: Single frontal view of the pelvis demonstrates no acute displaced fractures. Hips are well aligned. Joint spaces are well preserved. Sacroiliac joints are normal. IMPRESSION: 1. No acute pelvic fracture. Electronically Signed   By: Randa Ngo M.D.   On: 11/18/2019 21:49   CT HEAD WO CONTRAST  Result Date: 11/23/2019 CLINICAL DATA:  Benign brain neoplasm. Preop evaluation. BrainLAB protocol. EXAM: CT HEAD WITHOUT CONTRAST TECHNIQUE: Contiguous axial images were obtained from the base of the skull through the vertex without intravenous contrast. COMPARISON:  CT head 11/21/2019.  MRI head with contrast 11/21/2019 FINDINGS: Brain: Right convexity extra-axial mass lesion is unchanged. This is slightly hyperdense to cortex and abuts the falx and superior sagittal sinus. The mass lesion shows irregular enhancement on MRI. There is a moderate amount of vasogenic edema in the right frontal parietal lobe. Findings are most compatible with meningioma. Chronic infarct right posterolateral  temporal lobe is unchanged. Negative for acute infarct or hemorrhage. Vascular: Negative for hyperdense vessel. Skull: No skeletal lesion.  No skeletal hyperostosis. Sinuses/Orbits: Paranasal sinuses clear. Chronic fracture left orbital floor is unchanged. No orbital mass lesion. Other: None IMPRESSION: Right convexity extra-axial mass compatible with meningioma. This abuts the falx and superior sagittal sinus. Intravenous contrast not administered on the CT. There is moderate amount of vasogenic edema in the right frontal and parietal lobes. No change from recent studies. Chronic infarct right posterolateral temporal lobe. Electronically Signed   By: Franchot Gallo M.D.   On: 11/23/2019 10:55   CT Head Wo Contrast  Result Date: 11/21/2019 CLINICAL DATA:  Fall with head trauma. EXAM: CT HEAD WITHOUT CONTRAST TECHNIQUE: Contiguous axial images were obtained from the base of the skull through the vertex without intravenous contrast. COMPARISON:  03/06/2019 FINDINGS: Brain: Remote right MCA branch infarct affecting the parietal and lateral temporal lobes primarily. Edema in the superior right frontal lobe with masslike isodense abnormality at the vertex which may be extra-axial, and measures up to 3.4 cm. The brain edema does not seem to involve cortex and is more likely vasogenic and cytotoxic. Vascular: No hyperdense vessel or unexpected calcification. Skull: Negative Sinuses/Orbits: Partial coverage of a remote blowout fracture involving the left orbital floor. These results were called by telephone at the time of interpretation on 11/21/2019 at 9:43 am to provider [facility] , who verbally acknowledged these results. IMPRESSION: 1. Large, isodense mass at the right apex with vasogenic edema. Recommend brain MRI with contrast. 2. Remote right MCA branch infarct. Electronically Signed   By: Monte Fantasia M.D.   On: 11/21/2019 09:43   CT CHEST W CONTRAST  Result Date: 11/22/2019 CLINICAL DATA:  Brain  neoplasm, staging EXAM: CT CHEST, ABDOMEN, AND PELVIS WITH CONTRAST TECHNIQUE: Multidetector CT imaging of the chest, abdomen and pelvis was performed following the standard protocol during bolus administration of intravenous contrast. CONTRAST:  118mL OMNIPAQUE IOHEXOL 300 MG/ML SOLN, additional oral enteric contrast COMPARISON:  CT chest abdomen pelvis, 03/01/2019 FINDINGS: CT CHEST FINDINGS Cardiovascular: Aortic atherosclerosis. 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 Unchanged enlargement of the descending thoracic aorta, measuring up to 3.4 x 3.4 cm near the diaphragm. Normal heart size. Enlargement of the main pulmonary artery up to 3.8 cm in caliber. No pericardial effusion. Mediastinum/Nodes: No enlarged mediastinal, hilar, or axillary lymph nodes. Thyroid gland, trachea, and esophagus demonstrate no significant findings. Lungs/Pleura: Moderate centrilobular emphysema. Bandlike scarring of the left upper lobe. Additional scarring of the bilateral lower lungs. No pleural effusion or pneumothorax. Musculoskeletal: No chest wall mass. Nonacute fractures of the lateral left fifth and sixth ribs. CT ABDOMEN PELVIS FINDINGS Hepatobiliary: No solid liver abnormality is seen. No gallstones, gallbladder wall thickening, or biliary dilatation. Pancreas: Unremarkable. No pancreatic ductal dilatation or surrounding inflammatory changes. Spleen: Normal in size without significant abnormality. Adrenals/Urinary Tract: Unchanged small benign left adrenal adenoma. Kidneys are normal, without renal calculi, solid lesion, or hydronephrosis. Bladder is unremarkable. Stomach/Bowel: Stomach is within normal limits. Appendix appears normal. No evidence of bowel wall thickening, distention, or inflammatory changes. Vascular/Lymphatic: Aortic atherosclerosis. No enlarged abdominal or pelvic lymph nodes. Reproductive: No mass or other abnormality. Other: No abdominal wall hernia  or abnormality. No abdominopelvic ascites. Musculoskeletal: No acute or significant osseous findings. IMPRESSION: 1. No evidence of primary or metastatic disease within the chest, abdomen, or pelvis. 2. Unchanged bandlike scarring of the left upper lobe, in keeping  with prior radiation therapy. No evidence of recurrent lung malignancy. 3. 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 4. Emphysema (ICD10-J43.9). 5. Enlargement of the main pulmonary artery up to 3.8 cm in caliber, which can be seen in pulmonary hypertension. 6. Aortic Atherosclerosis (ICD10-I70.0). Electronically Signed   By: Eddie Candle M.D.   On: 11/22/2019 16:29   MR BRAIN W WO CONTRAST  Result Date: 11/29/2019 CLINICAL DATA:  Brain tumor resection. EXAM: MRI HEAD WITHOUT AND WITH CONTRAST TECHNIQUE: Multiplanar, multiecho pulse sequences of the brain and surrounding structures were obtained without and with intravenous contrast. CONTRAST:  7.73mL GADAVIST GADOBUTROL 1 MMOL/ML IV SOLN COMPARISON:  Brain MRI 11/21/2019 FINDINGS: BRAIN: Status post lesion resection in the superior right hemisphere. There is blood at the resection cavity. There is also a small amount of blood along the posterior aspect of the right hemisphere. No midline shift or other mass effect. Hyperintense T2-weighted signal within the right frontal and parietal lobes is unchanged. There is no residual nodular contrast enhancement at the resection site. There is left 10 meningeal contrast enhancement in the posterolateral right temporal lobe with T2-weighted signal that is unchanged. This is a site of an old infarct. VASCULAR: Major flow voids are preserved. SKULL AND UPPER CERVICAL SPINE: Normal calvarium and skull base. Visualized upper cervical spine and soft tissues are normal. SINUSES/ORBITS: No paranasal sinus fluid levels or advanced mucosal thickening. No  mastoid or middle ear effusion. Normal orbits. IMPRESSION: 1. Status post resection of right convexity meningioma without residual nodular contrast enhancement. This will serve as baseline for future studies. 2. Unchanged small area of meningeal contrast enhancement in the posterolateral right temporal lobe, a post-ischemic phenomenon related to the infarct demonstrated 03/07/19. 3. Blood at the superior right convexity resection cavity and at the posterior right convexity. No significant mass effect. Electronically Signed   By: Ulyses Jarred M.D.   On: 11/29/2019 23:45   MR Brain W and Wo Contrast  Result Date: 11/21/2019 CLINICAL DATA:  Fall, hypotensive EXAM: MRI HEAD WITHOUT AND WITH CONTRAST TECHNIQUE: Multiplanar, multiecho pulse sequences of the brain and surrounding structures were obtained without and with intravenous contrast. CONTRAST:  8.47mL GADAVIST GADOBUTROL 1 MMOL/ML IV SOLN COMPARISON:  03/07/2019 FINDINGS: Significant motion artifact is present. Findings below are within this limitation. Brain: Extra-axial heterogeneously enhancing lesion is present at the vertex on the right making broad dural contact with the superior frontoparietal convexity and falx. This likely reflects progression of small extra-axial lesion seen on the 2020 study. Lesion currently measures approximately 4.5 x 3.6 x 2.4 cm. This abuts the superior sagittal sinus, which appears patent. Invasion is difficult to exclude on this motion degraded study. There is underlying parenchymal edema with mass effect on the right lateral ventricle. Sulcal T2 FLAIR hyperintensity is noted in the adjacent cortical sulci without apparent corresponding enhancement. There is a trace subdural collection along posterior right cerebral convexity measuring up to 2 mm in thickness. There is no acute infarction or intracranial hemorrhage. There is right temporal encephalomalacia reflecting chronic infarction with some susceptibility likely reflecting  chronic blood products. Chronic infarct of the posterior right lentiform nucleus also with evidence of chronic blood products. There is ex vacuo dilatation of the right lateral ventricle. There is no intracranial mass, mass effect, or edema. There is no hydrocephalus or extra-axial fluid collection. Vascular: Major vessel flow voids at the  skull base are preserved. Skull and upper cervical spine: Chronic depression of the floor of the left orbit. Marrow signal is grossly normal. Sinuses/Orbits: Paranasal sinuses are aerated. Orbits are unremarkable. Other: Sella is unremarkable.  Mastoid air cells are clear. IMPRESSION: Significant motion artifact. Extra-axial lesion at the vertex on the right with associated parenchymal edema and regional mass effect. Abuts superior sagittal sinus, which appears patent but invasion is difficult to exclude on this degraded study. A much smaller lesion was present in 2020 and this likely reflects a meningioma. There is nonspecific abnormal adjacent sulcal signal, which may reflect compression related alteration of hemodynamics. A concurrent leptomeningeal process is not excluded but unlikely unless this represents a metastasis. Trace posterior right cerebral convexity subdural collection. Additional chronic findings detailed above. Electronically Signed   By: Macy Mis M.D.   On: 11/21/2019 18:03   CT ABDOMEN PELVIS W CONTRAST  Result Date: 11/22/2019 CLINICAL DATA:  Brain neoplasm, staging EXAM: CT CHEST, ABDOMEN, AND PELVIS WITH CONTRAST TECHNIQUE: Multidetector CT imaging of the chest, abdomen and pelvis was performed following the standard protocol during bolus administration of intravenous contrast. CONTRAST:  152mL OMNIPAQUE IOHEXOL 300 MG/ML SOLN, additional oral enteric contrast COMPARISON:  CT chest abdomen pelvis, 03/01/2019 FINDINGS: CT CHEST FINDINGS Cardiovascular: Aortic atherosclerosis. Unchanged enlargement of the tubular ascending thoracic aorta, measuring  up to 4.1 x 4.0 cm. Unchanged enlargement of the descending thoracic aorta, measuring up to 3.4 x 3.4 cm near the diaphragm. Normal heart size. Enlargement of the main pulmonary artery up to 3.8 cm in caliber. No pericardial effusion. Mediastinum/Nodes: No enlarged mediastinal, hilar, or axillary lymph nodes. Thyroid gland, trachea, and esophagus demonstrate no significant findings. Lungs/Pleura: Moderate centrilobular emphysema. Bandlike scarring of the left upper lobe. Additional scarring of the bilateral lower lungs. No pleural effusion or pneumothorax. Musculoskeletal: No chest wall mass. Nonacute fractures of the lateral left fifth and sixth ribs. CT ABDOMEN PELVIS FINDINGS Hepatobiliary: No solid liver abnormality is seen. No gallstones, gallbladder wall thickening, or biliary dilatation. Pancreas: Unremarkable. No pancreatic ductal dilatation or surrounding inflammatory changes. Spleen: Normal in size without significant abnormality. Adrenals/Urinary Tract: Unchanged small benign left adrenal adenoma. Kidneys are normal, without renal calculi, solid lesion, or hydronephrosis. Bladder is unremarkable. Stomach/Bowel: Stomach is within normal limits. Appendix appears normal. No evidence of bowel wall thickening, distention, or inflammatory changes. Vascular/Lymphatic: Aortic atherosclerosis. No enlarged abdominal or pelvic lymph nodes. Reproductive: No mass or other abnormality. Other: No abdominal wall hernia or abnormality. No abdominopelvic ascites. Musculoskeletal: No acute or significant osseous findings. IMPRESSION: 1. No evidence of primary or metastatic disease within the chest, abdomen, or pelvis. 2. Unchanged bandlike scarring of the left upper lobe, in keeping with prior radiation therapy. No evidence of recurrent lung malignancy. 3. 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 4. Emphysema  (ICD10-J43.9). 5. Enlargement of the main pulmonary artery up to 3.8 cm in caliber, which can be seen in pulmonary hypertension. 6. Aortic Atherosclerosis (ICD10-I70.0). Electronically Signed   By: Eddie Candle M.D.   On: 11/22/2019 16:29   DG Chest Port 1 View  Result Date: 11/21/2019 CLINICAL DATA:  Fall and hypoxia EXAM: PORTABLE CHEST 1 VIEW COMPARISON:  03/03/2019 FINDINGS: Normal heart size. Prominent main pulmonary artery contour. Streaky opacity in the left mid lung where there is chronic rib deformity that is likely sequela of lung cancer treatment. There is no  edema, consolidation, effusion, or pneumothorax. IMPRESSION: No acute finding or change from prior. Electronically Signed   By: Monte Fantasia M.D.   On: 11/21/2019 07:06   ECHOCARDIOGRAM COMPLETE  Result Date: 11/22/2019    ECHOCARDIOGRAM REPORT   Patient Name:   NASTASHA REISING Date of Exam: 11/22/2019 Medical Rec #:  [id number]        Height:       69.0 in Accession #:    [id number]       Weight:       185.0 lb Date of Birth:  Aug 03, 1945        BSA:          1.999 m Patient Age:    57 years         BP:           115/90 mmHg Patient Gender: F                HR:           78 bpm. Exam Location:  Inpatient Procedure: 2D Echo, Cardiac Doppler and Color Doppler Indications:    Syncope 780.2 / R55  History:        Patient has prior history of Echocardiogram examinations, most                 recent 03/08/2019. COPD and Stroke; Risk Factors:Hypertension,                 Dyslipidemia and Current Smoker. PE.  Sonographer:    Vickie Epley RDCS Referring Phys: [id number]  1. Left ventricular ejection fraction, by estimation, is 60 to 65%. The left ventricle has normal function. The left ventricle has no regional wall motion abnormalities. Left ventricular diastolic parameters were normal.  2. Right ventricular systolic function is normal. The right ventricular size is normal. Tricuspid regurgitation signal is inadequate for  assessing PA pressure.  3. The mitral valve is grossly normal. No evidence of mitral valve regurgitation. No evidence of mitral stenosis.  4. The aortic valve is tricuspid. Aortic valve regurgitation is not visualized. Mild to moderate aortic valve sclerosis/calcification is present, without any evidence of aortic stenosis.  5. The inferior vena cava is normal in size with greater than 50% respiratory variability, suggesting right atrial pressure of 3 mmHg. Comparison(s): No significant change from prior study. FINDINGS  Left Ventricle: Left ventricular ejection fraction, by estimation, is 60 to 65%. The left ventricle has normal function. The left ventricle has no regional wall motion abnormalities. The left ventricular internal cavity size was normal in size. There is  no left ventricular hypertrophy. Left ventricular diastolic parameters were normal. Right Ventricle: The right ventricular size is normal. No increase in right ventricular wall thickness. Right ventricular systolic function is normal. Tricuspid regurgitation signal is inadequate for assessing PA pressure. Left Atrium: Left atrial size was normal in size. Right Atrium: Right atrial size was normal in size. Pericardium: Trivial pericardial effusion is present. Mitral Valve: The mitral valve is grossly normal. Mild mitral annular calcification. No evidence of mitral valve regurgitation. No evidence of mitral valve stenosis. Tricuspid Valve: The tricuspid valve is grossly normal. Tricuspid valve regurgitation is trivial. No evidence of tricuspid stenosis. Aortic Valve: The aortic valve is tricuspid. Aortic valve regurgitation is not visualized. Mild to moderate aortic valve sclerosis/calcification is present, without any evidence of aortic stenosis. There is moderate calcification of the aortic valve. Pulmonic Valve: The pulmonic valve was grossly normal. Pulmonic valve  regurgitation is not visualized. No evidence of pulmonic stenosis. Aorta: The aortic  root is normal in size and structure. Venous: The inferior vena cava is normal in size with greater than 50% respiratory variability, suggesting right atrial pressure of 3 mmHg. IAS/Shunts: The atrial septum is grossly normal.  LEFT VENTRICLE PLAX 2D LVIDd:         4.61 cm      Diastology LVIDs:         3.40 cm      LV e' lateral:   10.10 cm/s LV PW:         0.82 cm      LV E/e' lateral: 8.4 LV IVS:        0.82 cm      LV e' medial:    9.14 cm/s LVOT diam:     2.20 cm      LV E/e' medial:  9.3 LV SV:         79 LV SV Index:   39 LVOT Area:     3.80 cm  LV Volumes (MOD) LV vol d, MOD A2C: 105.0 ml LV vol d, MOD A4C: 130.0 ml LV vol s, MOD A2C: 39.4 ml LV vol s, MOD A4C: 48.1 ml LV SV MOD A2C:     65.6 ml LV SV MOD A4C:     130.0 ml LV SV MOD BP:      79.2 ml RIGHT VENTRICLE RV S prime:     14.40 cm/s TAPSE (M-mode): 2.0 cm LEFT ATRIUM             Index       RIGHT ATRIUM           Index LA diam:        3.70 cm 1.85 cm/m  RA Area:     11.40 cm LA Vol (A2C):   44.6 ml 22.31 ml/m RA Volume:   23.40 ml  11.71 ml/m LA Vol (A4C):   26.7 ml 13.36 ml/m LA Biplane Vol: 35.9 ml 17.96 ml/m  AORTIC VALVE LVOT Vmax:   99.20 cm/s LVOT Vmean:  64.200 cm/s LVOT VTI:    0.207 m  AORTA Ao Root diam: 3.30 cm MITRAL VALVE MV Area (PHT): 3.77 cm    SHUNTS MV Decel Time: 201 msec    Systemic VTI:  0.21 m MV E velocity: 85.30 cm/s  Systemic Diam: 2.20 cm MV A velocity: 73.30 cm/s MV E/A ratio:  1.16 Eleonore Chiquito MD Electronically signed by Eleonore Chiquito MD Signature Date/Time: 11/22/2019/5:02:49 PM    Final    DG Hip Unilat W or Wo Pelvis 2-3 Views Left  Result Date: 11/21/2019 CLINICAL DATA:  Fall this morning with hip pain EXAM: DG HIP (WITH OR WITHOUT PELVIS) 2-3V LEFT COMPARISON:  03/01/2019 FINDINGS: There is no evidence of hip fracture or dislocation. There is no evidence of arthropathy or other focal bone abnormality. IMPRESSION: Negative. Electronically Signed   By: Monte Fantasia M.D.   On: 11/21/2019 07:50        IMPRESSION/PLAN: 1. 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 Dr. Lisbeth Renshaw discusses the pathology findings and reviews the nature of recurrent metastatic disease and outlines the rationale to proceed with radiotherapy to the postop resection site. We will also reach out to Dr. [name] to make sure he's aware of her case. She may need additional imaging. We reviewed the rationale for postoperative stereotactic radiosurgery [facility]) and that we  would also need a 3T MRI scan for planning purposes. We discussed the risks, benefits, short, and long term effects of radiotherapy, and the patient is interested in proceeding. Dr. Lisbeth Renshaw discusses the delivery and logistics of radiotherapy and anticipates a course of 3 fractions of radiotherapy. Our brain oncology navigator Mont Dutton, RT will also be in touch with the patient to coordinate her 3T MRI, and subsequent simulation and treatment. She will sign written consent at the time of her simulation.    Given current concerns for patient exposure during the COVID-19 pandemic, this encounter was conducted via telephone.  The patient has provided two factor identification and has given verbal consent for this type of encounter and has been advised to only accept a meeting of this type in a secure network environment. The time spent during this encounter was 60 minutes including preparation, discussion, and coordination of the patient's care. The attendants for this meeting include Mont Dutton, RT, Blenda Nicely, RN, Dr. Lisbeth Renshaw, Hayden Pedro  and [name] and her brother CARRAH EPPOLITO.  During the encounter, Mont Dutton, RT, Blenda Nicely, RN, Dr. Lisbeth Renshaw, and Hayden Pedro were located at [facility] Radiation Oncology Department.  RHYLAN GROSS was located at home. Her brother BJ Lastra was also located at his home.   The above documentation reflects  my direct findings during this shared patient visit. Please see the separate note by Dr. Lisbeth Renshaw on this date for the remainder of the patient's plan of care.    Carola Rhine, PAC

## 2019-12-13 ENCOUNTER — Telehealth: Payer: Self-pay | Admitting: Radiation Therapy

## 2019-12-13 NOTE — Telephone Encounter (Addendum)
[name] is currently residing at [facility] [phone]). I spoke with [name], the Teacher, early years/pre, at this facility and shared all of [name]'s upcoming appointment information to ensure that she makes it to those visits. [name] has recorded this information and did not relay any conflicts that would make having [name] at her appointments difficult.   I have also sent a detailed email to [name]'s brother, [name], sharing the same appointment information for his sister. He has replied to confirm this information was received.    E-mail Sent/Received :   Good afternoon [name], I hope you have safe travels back from [place]. As promised, I am sending you a list of your sister's upcoming planning appointments. I called [facility] and left a voicemail with their transportation desk as well to make sure she has transportation back and forth from the facility. Please let me know if you or [name] have any questions or concerns about any of the listed appointments.    Brain MRI- Monday 6/28, check in at 10:40am. Location: [facility] ([address])    Tuesday 6/29, check in at 8:00am. Location: [facility] ([address] This is the planning session where we will make the mask that she will wear during her radiation to keep her nice and still and we will do a CT scan of her head while she is wearing the mask for her treatment planning.    Thursday 7/1, check in at 3:15pm. Location: [facility] ([address] This is the day she will receive her radiation treatment. We will use the mask we made during her 6/29 appointment and set her up in the same position her CT scan was done. Then we will deliver the focused radiation treatment Dr. Lisbeth Renshaw and Bryson Ha described to you during the consult on 6/15. There is no pain or sensation from the treatment. The main complaint from patients is that the mask to keep them in place and still is  tight. The treatment will be less than 12min and then she will stay with our nurses for observation after the treatment is given for an additional 30 min before being released back to [facility].    Please let me know if you have any questions about what I have shared with you and thank you for your time.    Mont Dutton R.T.(R)(T) Radiation Special Procedures Navigator

## 2019-12-14 ENCOUNTER — Ambulatory Visit: Payer: Medicare Other | Admitting: Radiation Oncology

## 2019-12-14 ENCOUNTER — Ambulatory Visit: Payer: Medicaid Other | Admitting: Radiation Oncology

## 2019-12-19 ENCOUNTER — Ambulatory Visit: Payer: Medicare Other | Admitting: Radiation Oncology

## 2019-12-25 ENCOUNTER — Ambulatory Visit
Admission: RE | Admit: 2019-12-25 | Discharge: 2019-12-25 | Disposition: A | Discharge status: Home / Self Care | Payer: Medicare Other | Source: Ambulatory Visit | Attending: Radiation Oncology | Admitting: Radiation Oncology

## 2019-12-25 ENCOUNTER — Other Ambulatory Visit: Payer: Self-pay | Admitting: Radiation Therapy

## 2019-12-25 ENCOUNTER — Other Ambulatory Visit: Payer: Self-pay

## 2019-12-25 DIAGNOSIS — C7931 Secondary malignant neoplasm of brain: Secondary | ICD-10-CM

## 2019-12-25 NOTE — Progress Notes (Signed)
Has armband been applied?  Yes  Does patient have an allergy to IV contrast dye?: No   Has patient ever received premedication for IV contrast dye?: n/a  Does patient take metformin?: No  If patient does take metformin when was the last dose: n/a  Date of lab work: 12/26/2019 BUN: 20 CR: 0.98 EGfr: >60  IV site: Right Forearm  Has IV site been added to flowsheet?  Yes

## 2019-12-26 ENCOUNTER — Ambulatory Visit
Admission: RE | Admit: 2019-12-26 | Discharge: 2019-12-26 | Disposition: A | Discharge status: Home / Self Care | Payer: Medicare Other | Source: Ambulatory Visit | Attending: Radiation Oncology | Admitting: Radiation Oncology

## 2019-12-26 ENCOUNTER — Other Ambulatory Visit: Payer: Self-pay

## 2019-12-26 VITALS — BP 94/54 | HR 88 | Temp 97.6°F | Resp 20

## 2019-12-26 DIAGNOSIS — C7931 Secondary malignant neoplasm of brain: Secondary | ICD-10-CM

## 2019-12-26 DIAGNOSIS — C3412 Malignant neoplasm of upper lobe, left bronchus or lung: Secondary | ICD-10-CM | POA: Insufficient documentation

## 2019-12-26 DIAGNOSIS — C7949 Secondary malignant neoplasm of other parts of nervous system: Secondary | ICD-10-CM

## 2019-12-26 LAB — BUN & CREATININE (CHCC)
BUN: 20 mg/dL (ref 8–23)
Creatinine: 0.98 mg/dL (ref 0.44–1.00)
GFR, Est AFR Am: >60 mL/min (ref >60)
GFR, Estimated: 58 mL/min — ABNORMAL LOW (ref >60)

## 2019-12-26 MED ORDER — GADOBENATE DIMEGLUMINE 529 MG/ML IV SOLN
15.0000 mL | Freq: Once | INTRAVENOUS | Status: AC | PRN
  Administered 2019-12-26: 15 mL via INTRAVENOUS

## 2019-12-27 DIAGNOSIS — C7931 Secondary malignant neoplasm of brain: Secondary | ICD-10-CM | POA: Diagnosis not present

## 2019-12-28 ENCOUNTER — Other Ambulatory Visit: Payer: Self-pay

## 2019-12-28 ENCOUNTER — Ambulatory Visit
Admission: RE | Admit: 2019-12-28 | Discharge: 2019-12-28 | Disposition: A | Discharge status: Home / Self Care | Payer: Medicare Other | Source: Ambulatory Visit | Attending: Radiation Oncology | Admitting: Radiation Oncology

## 2019-12-28 DIAGNOSIS — C3412 Malignant neoplasm of upper lobe, left bronchus or lung: Secondary | ICD-10-CM | POA: Insufficient documentation

## 2019-12-28 DIAGNOSIS — Z510 Encounter for antineoplastic radiation therapy: Secondary | ICD-10-CM | POA: Insufficient documentation

## 2019-12-28 DIAGNOSIS — C7931 Secondary malignant neoplasm of brain: Secondary | ICD-10-CM | POA: Insufficient documentation

## 2019-12-28 NOTE — Progress Notes (Signed)
Ms. Stemler rested with [name] for 15 minutes following her [place] treatment.  Patient denies headache, dizziness, nausea, diplopia or ringing in the ears. Denies fatigue. Patient without complaints. Understands to avoid strenuous activity for the next 24 hours and call [phone] with needs.  Navigator escorted patient to lobby via wheelchair to wait for transportation back to [facility].  Gloriajean Dell. Leonie Green, BSN

## 2020-01-02 ENCOUNTER — Emergency Department (HOSPITAL_COMMUNITY): Payer: Medicare Other

## 2020-01-02 ENCOUNTER — Other Ambulatory Visit: Payer: Self-pay

## 2020-01-02 ENCOUNTER — Other Ambulatory Visit: Payer: Self-pay | Admitting: Radiation Therapy

## 2020-01-02 ENCOUNTER — Inpatient Hospital Stay (HOSPITAL_COMMUNITY)
Admission: EM | Admit: 2020-01-02 | Discharge: 2020-01-06 | DRG: 862 | Disposition: A | Discharge status: Home Health | Payer: Medicare Other | Source: Skilled Nursing Facility | Attending: Internal Medicine | Admitting: Internal Medicine

## 2020-01-02 ENCOUNTER — Inpatient Hospital Stay (HOSPITAL_COMMUNITY): Payer: Medicare Other

## 2020-01-02 ENCOUNTER — Encounter (HOSPITAL_COMMUNITY): Payer: Self-pay | Admitting: *Deleted

## 2020-01-02 ENCOUNTER — Ambulatory Visit: Payer: Medicare Other | Admitting: Radiation Oncology

## 2020-01-02 DIAGNOSIS — S0012XA Contusion of left eyelid and periocular area, initial encounter: Secondary | ICD-10-CM | POA: Diagnosis present

## 2020-01-02 DIAGNOSIS — F17213 Nicotine dependence, cigarettes, with withdrawal: Secondary | ICD-10-CM | POA: Diagnosis not present

## 2020-01-02 DIAGNOSIS — W050XXA Fall from non-moving wheelchair, initial encounter: Secondary | ICD-10-CM | POA: Diagnosis present

## 2020-01-02 DIAGNOSIS — Z66 Do not resuscitate: Secondary | ICD-10-CM | POA: Diagnosis present

## 2020-01-02 DIAGNOSIS — D332 Benign neoplasm of brain, unspecified: Secondary | ICD-10-CM

## 2020-01-02 DIAGNOSIS — Z86711 Personal history of pulmonary embolism: Secondary | ICD-10-CM

## 2020-01-02 DIAGNOSIS — N183 Chronic kidney disease, stage 3 unspecified: Secondary | ICD-10-CM | POA: Diagnosis present

## 2020-01-02 DIAGNOSIS — Z85118 Personal history of other malignant neoplasm of bronchus and lung: Secondary | ICD-10-CM

## 2020-01-02 DIAGNOSIS — G9389 Other specified disorders of brain: Secondary | ICD-10-CM | POA: Diagnosis present

## 2020-01-02 DIAGNOSIS — G935 Compression of brain: Secondary | ICD-10-CM | POA: Diagnosis present

## 2020-01-02 DIAGNOSIS — T8131XA Disruption of external operation (surgical) wound, not elsewhere classified, initial encounter: Secondary | ICD-10-CM | POA: Diagnosis present

## 2020-01-02 DIAGNOSIS — B20 Human immunodeficiency virus [HIV] disease: Secondary | ICD-10-CM

## 2020-01-02 DIAGNOSIS — G4733 Obstructive sleep apnea (adult) (pediatric): Secondary | ICD-10-CM | POA: Diagnosis present

## 2020-01-02 DIAGNOSIS — W19XXXA Unspecified fall, initial encounter: Secondary | ICD-10-CM

## 2020-01-02 DIAGNOSIS — Z21 Asymptomatic human immunodeficiency virus [HIV] infection status: Secondary | ICD-10-CM | POA: Diagnosis present

## 2020-01-02 DIAGNOSIS — Z9981 Dependence on supplemental oxygen: Secondary | ICD-10-CM | POA: Diagnosis not present

## 2020-01-02 DIAGNOSIS — N179 Acute kidney failure, unspecified: Secondary | ICD-10-CM | POA: Diagnosis present

## 2020-01-02 DIAGNOSIS — C7931 Secondary malignant neoplasm of brain: Secondary | ICD-10-CM | POA: Diagnosis present

## 2020-01-02 DIAGNOSIS — T8140XA Infection following a procedure, unspecified, initial encounter: Secondary | ICD-10-CM

## 2020-01-02 DIAGNOSIS — G936 Cerebral edema: Secondary | ICD-10-CM | POA: Diagnosis present

## 2020-01-02 DIAGNOSIS — J449 Chronic obstructive pulmonary disease, unspecified: Secondary | ICD-10-CM | POA: Diagnosis present

## 2020-01-02 DIAGNOSIS — S0003XA Contusion of scalp, initial encounter: Secondary | ICD-10-CM | POA: Diagnosis present

## 2020-01-02 DIAGNOSIS — Z20822 Contact with and (suspected) exposure to covid-19: Secondary | ICD-10-CM | POA: Diagnosis present

## 2020-01-02 DIAGNOSIS — Z7901 Long term (current) use of anticoagulants: Secondary | ICD-10-CM | POA: Diagnosis not present

## 2020-01-02 DIAGNOSIS — T8141XA Infection following a procedure, superficial incisional surgical site, initial encounter: Principal | ICD-10-CM | POA: Diagnosis present

## 2020-01-02 DIAGNOSIS — I69354 Hemiplegia and hemiparesis following cerebral infarction affecting left non-dominant side: Secondary | ICD-10-CM | POA: Diagnosis not present

## 2020-01-02 DIAGNOSIS — L0291 Cutaneous abscess, unspecified: Secondary | ICD-10-CM

## 2020-01-02 HISTORY — DX: Malignant (primary) neoplasm, unspecified: C80.1

## 2020-01-02 HISTORY — DX: Human immunodeficiency virus (HIV) disease: B20

## 2020-01-02 HISTORY — DX: Cerebral infarction, unspecified: I63.9

## 2020-01-02 HISTORY — DX: Asymptomatic human immunodeficiency virus (hiv) infection status: Z21

## 2020-01-02 HISTORY — DX: Disorder of kidney and ureter, unspecified: N28.9

## 2020-01-02 LAB — COMPREHENSIVE METABOLIC PANEL
ALT: 21 U/L (ref 0–44)
AST: 23 U/L (ref 15–41)
Albumin: 3 g/dL — ABNORMAL LOW (ref 3.5–5.0)
Alkaline Phosphatase: 58 U/L (ref 38–126)
Anion gap: 11 (ref 5–15)
BUN: 36 mg/dL — ABNORMAL HIGH (ref 8–23)
CO2: 24 mmol/L (ref 22–32)
Calcium: 9.1 mg/dL (ref 8.9–10.3)
Chloride: 99 mmol/L (ref 98–111)
Creatinine, Ser: 0.96 mg/dL (ref 0.44–1.00)
GFR calc Af Amer: >60 mL/min (ref >60)
GFR calc non Af Amer: 59 mL/min — ABNORMAL LOW (ref >60)
Glucose, Bld: 99 mg/dL (ref 70–99)
Potassium: 3.6 mmol/L (ref 3.5–5.1)
Sodium: 134 mmol/L — ABNORMAL LOW (ref 135–145)
Total Bilirubin: 0.6 mg/dL (ref 0.3–1.2)
Total Protein: 7.5 g/dL (ref 6.5–8.1)

## 2020-01-02 LAB — CBC WITH DIFFERENTIAL/PLATELET
Abs Immature Granulocytes: 0.06 10*3/uL (ref 0.00–0.07)
Basophils Absolute: 0 10*3/uL (ref 0.0–0.1)
Basophils Relative: 0 %
Eosinophils Absolute: 0.1 10*3/uL (ref 0.0–0.5)
Eosinophils Relative: 1 %
HCT: 38.6 % (ref 36.0–46.0)
Hemoglobin: 11.8 g/dL — ABNORMAL LOW (ref 12.0–15.0)
Immature Granulocytes: 1 %
Lymphocytes Relative: 17 %
Lymphs Abs: 1.5 10*3/uL (ref 0.7–4.0)
MCH: 27.2 pg (ref 26.0–34.0)
MCHC: 30.6 g/dL (ref 30.0–36.0)
MCV: 88.9 fL (ref 80.0–100.0)
Monocytes Absolute: 0.9 10*3/uL (ref 0.1–1.0)
Monocytes Relative: 10 %
Neutro Abs: 6.5 10*3/uL (ref 1.7–7.7)
Neutrophils Relative %: 71 %
Platelets: 205 10*3/uL (ref 150–400)
RBC: 4.34 MIL/uL (ref 3.87–5.11)
RDW: 14.3 % (ref 11.5–15.5)
WBC: 9.1 10*3/uL (ref 4.0–10.5)
nRBC: 0 % (ref 0.0–0.2)

## 2020-01-02 LAB — I-STAT CHEM 8, ED
BUN: 69 mg/dL — ABNORMAL HIGH (ref 8–23)
Calcium, Ion: 0.98 mmol/L — ABNORMAL LOW (ref 1.15–1.40)
Chloride: 99 mmol/L (ref 98–111)
Creatinine, Ser: 1.6 mg/dL — ABNORMAL HIGH (ref 0.44–1.00)
Glucose, Bld: 107 mg/dL — ABNORMAL HIGH (ref 70–99)
HCT: 41 % (ref 36.0–46.0)
Hemoglobin: 13.9 g/dL (ref 12.0–15.0)
Potassium: 7.8 mmol/L (ref 3.5–5.1)
Sodium: 131 mmol/L — ABNORMAL LOW (ref 135–145)
TCO2: 30 mmol/L (ref 22–32)

## 2020-01-02 LAB — LACTIC ACID, PLASMA: Lactic Acid, Venous: 1.1 mmol/L (ref 0.5–1.9)

## 2020-01-02 LAB — ABO/RH: ABO/RH(D): O POS

## 2020-01-02 LAB — CBC
HCT: 41.5 % (ref 36.0–46.0)
Hemoglobin: 12.9 g/dL (ref 12.0–15.0)
MCH: 27.4 pg (ref 26.0–34.0)
MCHC: 31.1 g/dL (ref 30.0–36.0)
MCV: 88.1 fL (ref 80.0–100.0)
Platelets: 238 10*3/uL (ref 150–400)
RBC: 4.71 MIL/uL (ref 3.87–5.11)
RDW: 14.7 % (ref 11.5–15.5)
WBC: 9 10*3/uL (ref 4.0–10.5)
nRBC: 0 % (ref 0.0–0.2)

## 2020-01-02 LAB — SARS CORONAVIRUS 2 BY RT PCR (HOSPITAL ORDER, PERFORMED IN ~~LOC~~ HOSPITAL LAB): SARS Coronavirus 2: NEGATIVE

## 2020-01-02 LAB — PROTIME-INR
INR: 1.2 (ref 0.8–1.2)
Prothrombin Time: 15 seconds (ref 11.4–15.2)

## 2020-01-02 LAB — TYPE AND SCREEN
ABO/RH(D): O POS
Antibody Screen: NEGATIVE

## 2020-01-02 LAB — ETHANOL: Alcohol, Ethyl (B): <10 mg/dL (ref <10)

## 2020-01-02 LAB — POTASSIUM: Potassium: 3.6 mmol/L (ref 3.5–5.1)

## 2020-01-02 LAB — TROPONIN I (HIGH SENSITIVITY)
Troponin I (High Sensitivity): 4 ng/L (ref <18)
Troponin I (High Sensitivity): 5 ng/L (ref <18)

## 2020-01-02 MED ORDER — SODIUM CHLORIDE 0.9 % IV SOLN
2.0000 g | Freq: Three times a day (TID) | INTRAVENOUS | Status: DC
  Administered 2020-01-02 – 2020-01-03 (×2): 2 g via INTRAVENOUS
  Filled 2020-01-02 (×2): qty 2

## 2020-01-02 MED ORDER — SODIUM CHLORIDE 0.9 % IV SOLN: INTRAVENOUS | Status: DC

## 2020-01-02 MED ORDER — VANCOMYCIN HCL IN DEXTROSE 1-5 GM/200ML-% IV SOLN
1000.0000 mg | Freq: Once | INTRAVENOUS | Status: AC
  Administered 2020-01-02: 1000 mg via INTRAVENOUS
  Filled 2020-01-02: qty 200

## 2020-01-02 MED ORDER — LORATADINE 10 MG PO TABS
10.0000 mg | ORAL_TABLET | Freq: Every day | ORAL | Status: DC
  Administered 2020-01-02 – 2020-01-06 (×5): 10 mg via ORAL
  Filled 2020-01-02 (×5): qty 1

## 2020-01-02 MED ORDER — METRONIDAZOLE IN NACL 5-0.79 MG/ML-% IV SOLN
500.0000 mg | Freq: Three times a day (TID) | INTRAVENOUS | Status: DC
  Administered 2020-01-02 – 2020-01-03 (×3): 500 mg via INTRAVENOUS
  Filled 2020-01-02 (×3): qty 100

## 2020-01-02 MED ORDER — EMPTY CONTAINERS FLEXIBLE MISC
900.0000 mg | Freq: Once | Status: AC
  Administered 2020-01-02: 900 mg via INTRAVENOUS
  Filled 2020-01-02: qty 90

## 2020-01-02 MED ORDER — GABAPENTIN 400 MG PO CAPS
800.0000 mg | ORAL_CAPSULE | Freq: Three times a day (TID) | ORAL | Status: DC
  Administered 2020-01-02 – 2020-01-06 (×14): 800 mg via ORAL
  Filled 2020-01-02 (×14): qty 2

## 2020-01-02 MED ORDER — MONTELUKAST SODIUM 10 MG PO TABS
10.0000 mg | ORAL_TABLET | Freq: Every day | ORAL | Status: DC
  Administered 2020-01-02 – 2020-01-05 (×4): 10 mg via ORAL
  Filled 2020-01-02 (×4): qty 1

## 2020-01-02 MED ORDER — ALBUTEROL SULFATE (2.5 MG/3ML) 0.083% IN NEBU: 2.5000 mg | INHALATION_SOLUTION | RESPIRATORY_TRACT | Status: DC | PRN

## 2020-01-02 MED ORDER — VENLAFAXINE HCL ER 75 MG PO CP24
150.0000 mg | ORAL_CAPSULE | Freq: Every day | ORAL | Status: DC
  Administered 2020-01-02 – 2020-01-06 (×5): 150 mg via ORAL
  Filled 2020-01-02: qty 2
  Filled 2020-01-02: qty 1
  Filled 2020-01-02 (×3): qty 2

## 2020-01-02 MED ORDER — MOMETASONE FURO-FORMOTEROL FUM 200-5 MCG/ACT IN AERO
2.0000 | INHALATION_SPRAY | Freq: Two times a day (BID) | RESPIRATORY_TRACT | Status: DC
  Administered 2020-01-03 – 2020-01-06 (×7): 2 via RESPIRATORY_TRACT
  Filled 2020-01-02: qty 8.8

## 2020-01-02 MED ORDER — ALBUTEROL SULFATE HFA 108 (90 BASE) MCG/ACT IN AERS
1.0000 | INHALATION_SPRAY | Freq: Four times a day (QID) | RESPIRATORY_TRACT | Status: DC | PRN
  Filled 2020-01-02: qty 6.7

## 2020-01-02 MED ORDER — GADOBUTROL 1 MMOL/ML IV SOLN
9.0000 mL | Freq: Once | INTRAVENOUS | Status: AC | PRN
  Administered 2020-01-02: 9 mL via INTRAVENOUS

## 2020-01-02 MED ORDER — FENTANYL CITRATE (PF) 100 MCG/2ML IJ SOLN: 25.0000 ug | Freq: Once | INTRAMUSCULAR | Status: DC

## 2020-01-02 MED ORDER — PIPERACILLIN-TAZOBACTAM 3.375 G IVPB 30 MIN
3.3750 g | Freq: Once | INTRAVENOUS | Status: DC
  Filled 2020-01-02: qty 50

## 2020-01-02 MED ORDER — SODIUM CHLORIDE 0.9 % IV SOLN
2.0000 g | INTRAVENOUS | Status: AC
  Administered 2020-01-02: 2 g via INTRAVENOUS
  Filled 2020-01-02: qty 2

## 2020-01-02 MED ORDER — VANCOMYCIN HCL 1250 MG/250ML IV SOLN
1250.0000 mg | Freq: Two times a day (BID) | INTRAVENOUS | Status: DC
  Administered 2020-01-03: 1250 mg via INTRAVENOUS
  Filled 2020-01-02 (×3): qty 250

## 2020-01-02 MED ORDER — METRONIDAZOLE IN NACL 5-0.79 MG/ML-% IV SOLN: 500.0000 mg | Freq: Once | INTRAVENOUS | Status: DC

## 2020-01-02 MED ORDER — TIZANIDINE HCL 4 MG PO TABS: 4.0000 mg | ORAL_TABLET | Freq: Two times a day (BID) | ORAL | Status: DC | PRN

## 2020-01-02 MED ORDER — SODIUM CHLORIDE 0.9 % IV SOLN: 2.0000 g | Freq: Two times a day (BID) | INTRAVENOUS | Status: DC

## 2020-01-02 MED ORDER — ONDANSETRON HCL 4 MG PO TABS: 4.0000 mg | ORAL_TABLET | Freq: Four times a day (QID) | ORAL | Status: DC | PRN

## 2020-01-02 MED ORDER — IOHEXOL 300 MG/ML  SOLN
100.0000 mL | Freq: Once | INTRAMUSCULAR | Status: AC | PRN
  Administered 2020-01-02: 100 mL via INTRAVENOUS

## 2020-01-02 MED ORDER — QUETIAPINE FUMARATE ER 50 MG PO TB24
150.0000 mg | ORAL_TABLET | Freq: Every day | ORAL | Status: DC
  Administered 2020-01-02 – 2020-01-05 (×4): 150 mg via ORAL
  Filled 2020-01-02 (×5): qty 3

## 2020-01-02 MED ORDER — VANCOMYCIN HCL IN DEXTROSE 1-5 GM/200ML-% IV SOLN: 1000.0000 mg | INTRAVENOUS | Status: DC

## 2020-01-02 MED ORDER — HYDROXYZINE HCL 25 MG PO TABS
25.0000 mg | ORAL_TABLET | Freq: Four times a day (QID) | ORAL | Status: DC
  Administered 2020-01-02 – 2020-01-06 (×17): 25 mg via ORAL
  Filled 2020-01-02 (×17): qty 1

## 2020-01-02 MED ORDER — GADOBUTROL 1 MMOL/ML IV SOLN: 9.0000 mL | Freq: Once | INTRAVENOUS | Status: DC | PRN

## 2020-01-02 MED ORDER — DULOXETINE HCL 20 MG PO CPEP
20.0000 mg | ORAL_CAPSULE | Freq: Every day | ORAL | Status: DC
  Administered 2020-01-02 – 2020-01-06 (×5): 20 mg via ORAL
  Filled 2020-01-02 (×5): qty 1

## 2020-01-02 MED ORDER — ROPINIROLE HCL 1 MG PO TABS
1.0000 mg | ORAL_TABLET | Freq: Every day | ORAL | Status: DC
  Administered 2020-01-02 – 2020-01-05 (×4): 1 mg via ORAL
  Filled 2020-01-02 (×4): qty 1

## 2020-01-02 MED ORDER — ONDANSETRON HCL 4 MG/2ML IJ SOLN: 4.0000 mg | Freq: Four times a day (QID) | INTRAMUSCULAR | Status: DC | PRN

## 2020-01-02 MED ORDER — UMECLIDINIUM BROMIDE 62.5 MCG/INH IN AEPB
1.0000 | INHALATION_SPRAY | Freq: Every day | RESPIRATORY_TRACT | Status: DC
  Administered 2020-01-03 – 2020-01-05 (×3): 1 via RESPIRATORY_TRACT
  Filled 2020-01-02: qty 7

## 2020-01-02 NOTE — ED Triage Notes (Signed)
Pt arrived by [facility] from [facility] after falling face first from wheelchair. C-collar in place, pt c/o neck and back pain; hematoma noted to L forehead; shortening to L leg . Pt A&Ox4; recent discharge 7/1 for tumor resection. Pt's bp reported to be 80/50. EMS unable to obtain access enroute. Pt is on Eliquis

## 2020-01-02 NOTE — Progress Notes (Signed)
Pharmacy Antibiotic Note  [name] is a 74 y.o. female s/p recent craniotomy admitted on 01/02/2020 with poss wound infection .  Pharmacy has been consulted for Vancomycin and Cefepime  Dosing.  Vancomycin 1 g IV given in ED at  0400  Plan: Vancomycin 1000 mg IV q24h Cefepime 2 g IV q12h  Height: 5\' 9"  (175.3 cm) Weight: 90.7 kg (200 lb) IBW/kg (Calculated) : 66.2  Temp (24hrs), Avg:96.4 F (35.8 C), Min:96.4 F (35.8 C), Max:96.4 F (35.8 C)  Recent Labs  Lab 01/02/20 0157 01/02/20 0207  WBC 9.0  --   CREATININE  --  1.60*  LATICACIDVEN 1.1  --     Estimated Creatinine Clearance: 38.1 mL/min (A) (by C-G formula based on SCr of 1.6 mg/dL (H)).    No Known Allergies  Caryl Pina 01/02/2020 6:03 AM

## 2020-01-02 NOTE — Progress Notes (Signed)
74 year old lady with prior history of COPD, HIV, history of stroke with left-sided hemiplegia, pulmonary embolism on Eliquis, history of lung cancer, meningioma s/p craniotomy was found to have metastatic intracranial disease presents to ED with a fall and hit her head.   CT of the head with contrast shows   Left frontal scalp contusion without subjacent calvarial fracture. 2. Postsurgical changes at the vertex from recent craniotomy at the site of meningioma resection with residual hyperdense tumor along the resection margin adjacent the superior sagittal sinus. There is an overlying air and fluid containing collection superficial to the craniotomy bone flap as well as additional mixed attenuation extra-axial fluid extending across the vertex and left falx. 3. Mild sulcal effacement throughout the cerebral hemisphere without significant midline shift. 4. Slight interval increase in the degree of vasogenic edema within the right frontal and parietal lobes.  Neurosurgery was consulted recommended getting an MRI of the brain with and without contrast to rule out abscess.  If the MRI shows superficial infection and no hematoma requiring evacuation medical management is advised with antibiotics and restarting her anticoagulation in 48 hours.  But if she was found to have an abscess or subdural empyema recommend operative drainage and washout. Continue with n.p.o. and gently hydrate. Resume home medications.    Hosie Poisson, MD

## 2020-01-02 NOTE — Consult Note (Signed)
Neurosurgery Consultation  Reason for Consult: Head trauma, recent craniotomy Referring Physician: Karleen Hampshire  CC: Forehead pain, left sided rib pain  HPI: This is a 74 y.o. woman that presents after a fall out of wheelchair. Well known to me, recent craniotomy for tumor resection followed by SRS to the residual last week, MMP detailed below, on eliquis for multiple indications including PE. She complains of pain over her left eye, where she has a subQ hematoma. No new weakness, numbness, or parasthesias, no neck pain, no generalized headache, no N/V.    ROS: A 14 point ROS was performed and is negative except as noted in the HPI.   PMHx:  Past Medical History:  Diagnosis Date  . Cancer ([place])   . COPD (chronic obstructive pulmonary disease) ([place])   . HIV (human immunodeficiency virus infection) ([place])   . Renal disorder   . Stroke [facility])    FamHx:  Family History  Family history unknown: Yes   SocHx:  reports that she has never smoked. She has never used smokeless tobacco. She reports previous alcohol use. She reports previous drug use.  Exam: Vital signs in last 24 hours: Temp:  [96.4 F (35.8 C)] 96.4 F (35.8 C) (07/06 0132) Pulse Rate:  [64-121] 74 (07/06 0525) Resp:  [14-19] 19 (07/06 0700) BP: (80-115)/(58-75) 106/59 (07/06 0700) SpO2:  [94 %-100 %] 94 % (07/06 0525) Weight:  [90.7 kg] 90.7 kg (07/06 0220) General: Awake, alert, cooperative, lying in bed in NAD Head: Normocephalic, +L supraorbital cephalohematoma HEENT: Neck supple Pulmonary: breathing room air comfortably, no evidence of increased work of breathing, some mild tenderness across the anterior superior chest wall on the left Cardiac: RRR Abdomen: S NT ND Extremities: Warm and well perfused x4 Neuro: AOx3, gaze neutral, PERRL, FS and symmetrically sensate Strength 5/5 on R, 0/5 on left with external rotation of the L hip and flexor position of left upper extremity SILTx4 R convexity incision with brown  turbid material draining from wound   Assessment and Plan: 74 y.o. 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 Post-op left sided weakness after resection, now s/p fall out of wheelchair on eliquis. [place] personally reviewed, which shows L frontal cephalomeatoma, resection cavity with some underlying air and fluid in the overlying scalp. Given that her residual tumor is hyperdense, difficult to fully evaluate new blood versus hematoma, grossly stable vasogenic edema / brain compression, no sites of intracranial hemorrhage remote to the resection cavity. Exam with stable L sided weakness, likely post-op wound infection.  -no acute neurosurgical intervention indicated at this time -recommend MRI w/wo contrast to evaluate for underlying abscess. If only a superficial infection and no hematoma requiring evacuation, given her numerous co-morbidities / immunocompromise / anticoagulation requirement, I would recommend maximal medical Tx with antibiotics and restarting her anticoagulation in 48 hours (given her trauma). If there is an abscess or subdural empyema, will require operative drainage / washout.  -best to keep NPO until MRI is completed just in case, if it does not show deep infection then can start a diet -please call with any concerns or questions  [name], MD 01/02/20 7:58 AM [facility]

## 2020-01-02 NOTE — ED Notes (Signed)
Attempted additional IV x 4 by 2 different RNs without success  Unable to obtain second set of cultures   Pt c/o head pain, brown drainage noted on sheet

## 2020-01-02 NOTE — Consult Note (Signed)
   Activation and Reason: level I, fall  Primary Survey: airway intact, breath sounds present bilaterally, hypotensive  [name] is an 74 y.o. female.  HPI: 74 yo female with HIV, lung cancer with metastasis to brain s/p craniotomy with resection 11/28/19 (Ostergard), COPD on home O2, on eliquis for PE was trying to put shoes on from her wheel chair and leaned to far over and fell. She denies loss of consciousness. She has pain on her left arm and left hip. Pain is constant and nonradiating. It is mild  No past medical history on file.  No family history on file.  Social History:  has no history on file for tobacco use, alcohol use, and drug use.  Allergies: Not on File  Medications: I have reviewed the patient's current medications.  No results found for this or any previous visit (from the past 48 hour(s)).  No results found.  Review of Systems  Unable to perform ROS: Acuity of condition    PE There were no vitals taken for this visit. Constitutional: NAD; conversant; left forehead contusion, scalp incision with some yellow drainage Eyes: Moist conjunctiva; no lid lag; anicteric; PERRL Neck: Trachea midline; no thyromegaly, no cervicalgia Lungs: Normal respiratory effort; no tactile fremitus CV: RRR; no palpable thrills; no pitting edema GI: Abd soft, NT, ND; periumbilical wound or incision, no erythema, no palpable hepatosplenomegaly MSK: unable to assess gait; no clubbing/cyanosis Psychiatric: Appropriate affect; alert and oriented x3 Lymphatic: No palpable cervical or axillary lymphadenopathy   Assessment/Plan: 74 yo female with multiple medical problems who fell from wheel chair. Living will notes DNR, ok for antibiotics and fluids, and has comfort care marked -CT HCCAP to assess for trauma  CT showing pneumocephalus and blood near surgical area. No traumatic injuries -recommend neurosurgical consultation -due to medical complexities and single system findings  on workup, recommend medical admission and trauma team as consultant  Procedures: none  [name] 01/02/2020, 1:51 AM

## 2020-01-02 NOTE — ED Provider Notes (Signed)
[place] EMERGENCY DEPARTMENT Provider Note   CSN: [id number] Arrival date & time: 01/02/20  0116     History Chief Complaint  Patient presents with  . Fall    [name] is a 74 y.o. female.  The history is provided by the EMS personnel and the patient.  Fall This is a new problem. The current episode started less than 1 hour ago. The problem occurs constantly. The problem has not changed since onset.Pertinent negatives include no chest pain, no abdominal pain, no headaches and no shortness of breath. Nothing aggravates the symptoms. Nothing relieves the symptoms. She has tried nothing for the symptoms. The treatment provided no relief.  Patient with complex PMH including HIV and cancer s/p craniotomy for resection of tumor in June of this year who presents post fall from [facility] at [facility] home.  LOC and low BP on eliquis.  Activated as a level 1 travel due to vitals on anti coagulation.       Past Medical History:  Diagnosis Date  . Cancer ([place])   . COPD (chronic obstructive pulmonary disease) ([place])   . HIV (human immunodeficiency virus infection) ([place])   . Renal disorder   . Stroke [facility])     There are no problems to display for this patient.      OB History   No obstetric history on file.     No family history on file.  Social History   Tobacco Use  . Smoking status: Not on file  Substance Use Topics  . Alcohol use: Not Currently  . Drug use: Not Currently    Home Medications Prior to Admission medications   Not on File    Allergies    Patient has no known allergies.  Review of Systems   Review of Systems  Constitutional: Negative for fever.  HENT: Negative for congestion.   Eyes: Negative for visual disturbance.  Respiratory: Negative for shortness of breath.   Cardiovascular: Negative for chest pain.  Gastrointestinal: Negative for abdominal pain.  Genitourinary: Negative for difficulty urinating.  Musculoskeletal:  Positive for back pain.  Skin: Positive for wound.  Neurological: Negative for headaches.  Psychiatric/Behavioral: Negative for agitation.  All other systems reviewed and are negative.   Physical Exam Updated Vital Signs BP (!) 80/58   Pulse 65   Temp (!) 96.4 F (35.8 C) (Temporal)   Resp 16   Ht 5\' 9"  (1.753 m)   Wt 90.7 kg   SpO2 98%   BMI 29.53 kg/m   Physical Exam Vitals and nursing note reviewed.  Constitutional:      General: She is not in acute distress.    Appearance: Normal appearance.  HENT:     Head: Normocephalic.      Right Ear: Tympanic membrane normal.     Left Ear: Tympanic membrane normal.     Nose: Nose normal.  Eyes:     Conjunctiva/sclera: Conjunctivae normal.     Pupils: Pupils are equal, round, and reactive to light.  Neck:     Comments: c collar  Cardiovascular:     Rate and Rhythm: Normal rate and regular rhythm.     Pulses: Normal pulses.     Heart sounds: Normal heart sounds.  Pulmonary:     Effort: Pulmonary effort is normal.     Breath sounds: Normal breath sounds.  Abdominal:     General: Abdomen is flat. Bowel sounds are normal.     Tenderness: There is no abdominal  tenderness. There is no guarding or rebound.  Musculoskeletal:        General: Normal range of motion.     Cervical back: Normal. No tenderness.     Thoracic back: Normal.     Lumbar back: Normal.     Right hip: Normal.     Left hip: Normal.     Right ankle: Normal.     Right Achilles Tendon: Normal.     Left ankle: Normal.     Left Achilles Tendon: Normal.  Skin:    General: Skin is warm and dry.     Capillary Refill: Capillary refill takes less than 2 seconds.  Neurological:     General: No focal deficit present.     Mental Status: She is alert and oriented to person, place, and time.     Deep Tendon Reflexes: Reflexes normal.  Psychiatric:        Mood and Affect: Mood normal.        Behavior: Behavior normal.     ED Results / Procedures / Treatments     Labs (all labs ordered are listed, but only abnormal results are displayed) Results for orders placed or performed during the hospital encounter of 01/02/20  CBC  Result Value Ref Range   WBC 9.0 4.0 - 10.5 K/uL   RBC 4.71 3.87 - 5.11 MIL/uL   Hemoglobin 12.9 12.0 - 15.0 g/dL   HCT 41.5 36 - 46 %   MCV 88.1 80.0 - 100.0 fL   MCH 27.4 26.0 - 34.0 pg   MCHC 31.1 30.0 - 36.0 g/dL   RDW 14.7 11.5 - 15.5 %   Platelets 238 150 - 400 K/uL   nRBC 0.0 0.0 - 0.2 %  Ethanol  Result Value Ref Range   Alcohol, Ethyl (B) <10 <10 mg/dL  Lactic acid, plasma  Result Value Ref Range   Lactic Acid, Venous 1.1 0.5 - 1.9 mmol/L  Protime-INR  Result Value Ref Range   Prothrombin Time 15.0 11.4 - 15.2 seconds   INR 1.2 0.8 - 1.2  I-Stat Chem 8, ED  Result Value Ref Range   Sodium 131 (L) 135 - 145 mmol/L   Potassium 7.8 (HH) 3.5 - 5.1 mmol/L   Chloride 99 98 - 111 mmol/L   BUN 69 (H) 8 - 23 mg/dL   Creatinine, Ser 1.60 (H) 0.44 - 1.00 mg/dL   Glucose, Bld 107 (H) 70 - 99 mg/dL   Calcium, Ion 0.98 (L) 1.15 - 1.40 mmol/L   TCO2 30 22 - 32 mmol/L   Hemoglobin 13.9 12.0 - 15.0 g/dL   HCT 41.0 36 - 46 %  Sample to Blood Bank  Result Value Ref Range   Blood Bank Specimen SAMPLE AVAILABLE FOR TESTING    Sample Expiration      01/05/2020,2359 Performed at [facility], [address] [place], [place] [address]   Type and screen  Result Value Ref Range   ABO/RH(D) PENDING    Antibody Screen PENDING    Sample Expiration      01/05/2020,2359 Performed at [facility], [place] [address] [place], [place] [address]    CT HEAD WO CONTRAST  Result Date: 01/02/2020 CLINICAL DATA:  Fall on blood thinners, left periorbital hematoma EXAM: CT HEAD WITHOUT CONTRAST CT CERVICAL SPINE WITHOUT CONTRAST CT CHEST, ABDOMEN AND PELVIS WITH CONTRAST TECHNIQUE: Contiguous axial images were obtained from the base of the skull through the vertex without intravenous contrast. Multidetector CT imaging of  the cervical spine was performed without intravenous contrast. Multiplanar CT image reconstructions were also generated. Multidetector CT imaging of the chest, abdomen and pelvis was performed following the standard protocol during bolus administration of intravenous contrast. CONTRAST:  180mL OMNIPAQUE IOHEXOL 300 MG/ML  SOLN COMPARISON:  Numerous priors, most recently head 12/26/2019, CT head 11/23/2019 CT chest, abdomen and pelvis 11/22/2019 FINDINGS: CT HEAD FINDINGS Brain: Extensive vasogenic edema throughout the right frontal and parietal lobes, minimally increased from recent MRI and CT. 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 There is an overlying air and fluid containing collection superficial to the craniotomy bone flap as well as additional mixed attenuation air and fluid extra-axial collection extending across the vertex and along the left falx. Hyperdensity along the adjacent sulci could reflect reactive dural thickening or hemorrhage. Some associated resulting mass effect along the right cerebral hemisphere with slight global sulcal effacement with features suggesting more diffuse volume loss elsewhere in the brain. No significant midline shift or transtentorial migration. Basal cisterns are patent. Vascular: Atherosclerotic calcification of the carotid siphons. No hyperdense vessel. Skull: Postsurgical changes at the vertex from recent craniotomy as above with overlying and subjacent swelling and hematoma. Additional site of left frontal scalp swelling likely related to the acute trauma is noted with a small crescentic scalp hematoma measuring up to 5 mm in maximal thickness. No subjacent calvarial fracture. Other scalp hematoma. Chronic deformities of the nasal bones are similar to prior. Sinuses/Orbits: Paranasal sinuses and mastoid air cells are predominantly clear. No retro septal  gas, stranding or hemorrhage. Included orbital structures are otherwise unremarkable. Other: None CT CERVICAL SPINE FINDINGS Alignment: Stabilization collar in place. Straightening of the normal cervical lordosis. Mild rightward cranial rotation. No evidence of traumatic listhesis. No abnormally widened, perched or jumped facets. Normal alignment of the craniocervical and atlantoaxial articulations accounting for degree of cranial rotation. Skull base and vertebrae: No visible skull base fracture. No vertebral body fracture or height loss. Posterior elements are intact. Multilevel spondylitic changes, uncinate spurring and facet degenerative change, detailed below. No acute or worrisome osseous lesion. Soft tissues and spinal canal: No pre or paravertebral fluid or swelling. No visible canal hematoma. Disc levels: Multilevel intervertebral disc height loss with cervical spondylitic changes maximal C5-C7. Posterior disc osteophyte complexes are present resulting at most maximal mild canal stenosis. Multilevel uncinate spurring and facet hypertrophic changes result in mild-to-moderate multilevel neural foraminal narrowing as well. No severe canal stenosis or foraminal impingement. Other: Cervical carotid atherosclerosis. CT CHEST FINDINGS Cardiovascular: The aortic root is suboptimally assessed given cardiac pulsation artifact. Calcifications of the aortic leaflets. Borderline dilated ascending thoracic aorta at 4 cm. Proximal dilatation of the descending thoracic aorta is similar to prior measuring up to 3.3 cm in diameter and 3.5 cm at the level of the diaphragm. No acute luminal abnormality. No periaortic stranding or hemorrhage. Normal 3 vessel branching of the aortic arch. Minimal plaque in the proximal great vessels without other significant luminal abnormality or acute finding. Central pulmonary arteries are borderline enlarged. No large central or lobar filling defects on this non tailored examination of the  pulmonary arteries. Normal heart size. Coronary artery calcifications are present. No pericardial effusion. Mediastinum/Nodes: No mediastinal fluid or gas. Normal thyroid gland and thoracic inlet. No acute abnormality of the trachea or esophagus. No worrisome mediastinal, hilar or axillary adenopathy. Lungs/Pleura: No acute traumatic parenchymal injury. No pneumothorax or effusion. Dependent atelectatic  changes. Stable multifocal areas of bandlike opacity in the mid to lower lungs compatible with areas of cicatricial or atelectatic change. No focal consolidation or features of edema. Mild bronchitic airways thickening. 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 No acute rib fractures. No acute osseous injury of the chest wall elsewhere nor in the thoracic spine or included shoulders. No large body wall hematoma or other acute traumatic injury of the soft tissues. CT ABDOMEN PELVIS FINDINGS Hepatobiliary: No direct hepatic injury or perihepatic hematoma. No worrisome focal liver abnormality is seen. Normal gallbladder. No visible calcified gallstones. No biliary ductal dilatation. Pancreas: No pancreatic contusive change or ductal disruption. No peripancreatic inflammation or discernible lesions. Spleen: Normal splenic size. Stable hypoattenuating cleft at the superior spleen. No direct splenic injury or perisplenic hematoma. Adrenals/Urinary Tract: Normal adrenal glands without evidence of hemorrhage. No direct renal injury or perinephric hemorrhage. Slight non rotation of the left kidney, benign anatomic variant. No concerning renal mass, urolithiasis or hydronephrosis. Urinary bladder is unremarkable. Stomach/Bowel: Distal esophagus, stomach and duodenum are free of acute abnormality. A normal appendix is visualized. Moderate colonic stool burden without proximal thickening or distension. Scattered colonic diverticula without focal inflammation to suggest  diverticulitis. Some mild rectal wall thickening with large inspissated stool ball measuring up to 7.9 cm in diameter with associated faint perirectal stranding. Vascular/Lymphatic: No acute vascular injury or contrast extravasation in the abdomen or pelvis. Atherosclerotic calcifications within the abdominal aorta and branch vessels. No aneurysm or ectasia. No enlarged abdominopelvic lymph nodes. Reproductive: Anteverted uterus with calcified subserosal fibroid. No concerning adnexal lesions. Other: No large body wall hematoma or retroperitoneal hemorrhage. No traumatic abdominal wall dehiscence. No abdominopelvic free air or fluid. Perirectal stranding, as above. Musculoskeletal: Sclerotic bands along the medial ilia may ring the SI joint may reflect sequela of remote pelvic insufficiency fracture, stable from priors. No acute fracture or traumatic osseous injury of the lumbar spine, bony pelvis or proximal femora. Multilevel discogenic and facet degenerative changes in the lumbar spine, maximal L5-S1. Additional degenerative changes in the hips and pelvis. IMPRESSION: CT HEAD 1. Left frontal scalp contusion without subjacent calvarial fracture. 2. Postsurgical changes at the vertex from recent craniotomy at the site of meningioma resection with residual hyperdense tumor along the resection margin adjacent the superior sagittal sinus. There is an overlying air and fluid containing collection superficial to the craniotomy bone flap as well as additional mixed attenuation extra-axial fluid extending across the vertex and left falx. 3. Mild sulcal effacement throughout the cerebral hemisphere without significant midline shift. 4. Slight interval increase in the degree of vasogenic edema within the right frontal and parietal lobes. 5. No other sites of hyperdense hemorrhage or other acute intracranial abnormality. 6. Background mild parenchymal volume loss and microvascular angiopathy. CT CERVICAL SPINE 1. No acute  fracture or traumatic osseous injury of the cervical spine. 2. Multilevel cervical spondylitic changes as detailed above. 3. Cervical carotid atherosclerosis. CT CHEST, ABDOMEN AND PELVIS 1. No acute traumatic injury to the chest. 2. No acute traumatic injury to the abdomen or pelvis. 3. Some mild rectal wall thickening with large inspissated stool ball measuring up to 7.9 cm in diameter with associated faint perirectal stranding. Correlate for signs and symptoms of stercoral colitis. 4. Colonic diverticulosis without evidence of acute diverticulitis. 5. Fibroid uterus. 6.  Emphysema (ICD10-J43.9).  Mild bronchitic changes as well. 7. Aortic Atherosclerosis (ICD10-I70.0). 8. 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 Recommend annual imaging followup by  CTA or MRA. This recommendation follows 2010 ACCF/AHA/AATS/ACR/ASA/SCA/SCAI/SIR/STS/SVM Guidelines for the Diagnosis and Management of Patients with Thoracic Aortic Disease. Circulation. 2010; 121: K481-E563. Aortic aneurysm NOS (ICD10-I71.9) Critical Value/emergent results were called by telephone at the time of interpretation on 01/02/2020 at 2:47 am to provider Dr. Kieth Brightly , who verbally acknowledged these results. Electronically Signed   By: Lovena Le M.D.   On: 01/02/2020 02:47   CT CHEST W CONTRAST  Result Date: 01/02/2020 CLINICAL DATA:  Fall on blood thinners, left periorbital hematoma EXAM: CT HEAD WITHOUT CONTRAST CT CERVICAL SPINE WITHOUT CONTRAST CT CHEST, ABDOMEN AND PELVIS WITH CONTRAST TECHNIQUE: Contiguous axial images were obtained from the base of the skull through the vertex without intravenous contrast. Multidetector CT imaging of the cervical spine was performed without intravenous contrast. Multiplanar CT image reconstructions were also generated. Multidetector CT imaging of the chest, abdomen and pelvis was performed following the standard protocol during bolus administration of intravenous contrast. CONTRAST:  153mL  OMNIPAQUE IOHEXOL 300 MG/ML  SOLN COMPARISON:  Numerous priors, most recently head 12/26/2019, CT head 11/23/2019 CT chest, abdomen and pelvis 11/22/2019 FINDINGS: CT HEAD FINDINGS Brain: Extensive vasogenic edema throughout the right frontal and parietal lobes, minimally increased from recent MRI and CT. 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 There is an overlying air and fluid containing collection superficial to the craniotomy bone flap as well as additional mixed attenuation air and fluid extra-axial collection extending across the vertex and along the left falx. Hyperdensity along the adjacent sulci could reflect reactive dural thickening or hemorrhage. Some associated resulting mass effect along the right cerebral hemisphere with slight global sulcal effacement with features suggesting more diffuse volume loss elsewhere in the brain. No significant midline shift or transtentorial migration. Basal cisterns are patent. Vascular: Atherosclerotic calcification of the carotid siphons. No hyperdense vessel. Skull: Postsurgical changes at the vertex from recent craniotomy as above with overlying and subjacent swelling and hematoma. Additional site of left frontal scalp swelling likely related to the acute trauma is noted with a small crescentic scalp hematoma measuring up to 5 mm in maximal thickness. No subjacent calvarial fracture. Other scalp hematoma. Chronic deformities of the nasal bones are similar to prior. Sinuses/Orbits: Paranasal sinuses and mastoid air cells are predominantly clear. No retro septal gas, stranding or hemorrhage. Included orbital structures are otherwise unremarkable. Other: None CT CERVICAL SPINE FINDINGS Alignment: Stabilization collar in place. Straightening of the normal cervical lordosis. Mild rightward cranial rotation. No evidence of traumatic listhesis. No  abnormally widened, perched or jumped facets. Normal alignment of the craniocervical and atlantoaxial articulations accounting for degree of cranial rotation. Skull base and vertebrae: No visible skull base fracture. No vertebral body fracture or height loss. Posterior elements are intact. Multilevel spondylitic changes, uncinate spurring and facet degenerative change, detailed below. No acute or worrisome osseous lesion. Soft tissues and spinal canal: No pre or paravertebral fluid or swelling. No visible canal hematoma. Disc levels: Multilevel intervertebral disc height loss with cervical spondylitic changes maximal C5-C7. Posterior disc osteophyte complexes are present resulting at most maximal mild canal stenosis. Multilevel uncinate spurring and facet hypertrophic changes result in mild-to-moderate multilevel neural foraminal narrowing as well. No severe canal stenosis or foraminal impingement. Other: Cervical carotid atherosclerosis. CT CHEST FINDINGS Cardiovascular: The aortic root is suboptimally assessed given cardiac pulsation artifact. Calcifications of the aortic leaflets. Borderline dilated ascending thoracic aorta at 4 cm. Proximal dilatation of  the descending thoracic aorta is similar to prior measuring up to 3.3 cm in diameter and 3.5 cm at the level of the diaphragm. No acute luminal abnormality. No periaortic stranding or hemorrhage. Normal 3 vessel branching of the aortic arch. Minimal plaque in the proximal great vessels without other significant luminal abnormality or acute finding. Central pulmonary arteries are borderline enlarged. No large central or lobar filling defects on this non tailored examination of the pulmonary arteries. Normal heart size. Coronary artery calcifications are present. No pericardial effusion. Mediastinum/Nodes: No mediastinal fluid or gas. Normal thyroid gland and thoracic inlet. No acute abnormality of the trachea or esophagus. No worrisome mediastinal, hilar or  axillary adenopathy. Lungs/Pleura: No acute traumatic parenchymal injury. No pneumothorax or effusion. Dependent atelectatic changes. Stable multifocal areas of bandlike opacity in the mid to lower lungs compatible with areas of cicatricial or atelectatic change. No focal consolidation or features of edema. Mild bronchitic airways thickening. 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 No acute rib fractures. No acute osseous injury of the chest wall elsewhere nor in the thoracic spine or included shoulders. No large body wall hematoma or other acute traumatic injury of the soft tissues. CT ABDOMEN PELVIS FINDINGS Hepatobiliary: No direct hepatic injury or perihepatic hematoma. No worrisome focal liver abnormality is seen. Normal gallbladder. No visible calcified gallstones. No biliary ductal dilatation. Pancreas: No pancreatic contusive change or ductal disruption. No peripancreatic inflammation or discernible lesions. Spleen: Normal splenic size. Stable hypoattenuating cleft at the superior spleen. No direct splenic injury or perisplenic hematoma. Adrenals/Urinary Tract: Normal adrenal glands without evidence of hemorrhage. No direct renal injury or perinephric hemorrhage. Slight non rotation of the left kidney, benign anatomic variant. No concerning renal mass, urolithiasis or hydronephrosis. Urinary bladder is unremarkable. Stomach/Bowel: Distal esophagus, stomach and duodenum are free of acute abnormality. A normal appendix is visualized. Moderate colonic stool burden without proximal thickening or distension. Scattered colonic diverticula without focal inflammation to suggest diverticulitis. Some mild rectal wall thickening with large inspissated stool ball measuring up to 7.9 cm in diameter with associated faint perirectal stranding. Vascular/Lymphatic: No acute vascular injury or contrast extravasation in the abdomen or pelvis. Atherosclerotic calcifications within  the abdominal aorta and branch vessels. No aneurysm or ectasia. No enlarged abdominopelvic lymph nodes. Reproductive: Anteverted uterus with calcified subserosal fibroid. No concerning adnexal lesions. Other: No large body wall hematoma or retroperitoneal hemorrhage. No traumatic abdominal wall dehiscence. No abdominopelvic free air or fluid. Perirectal stranding, as above. Musculoskeletal: Sclerotic bands along the medial ilia may ring the SI joint may reflect sequela of remote pelvic insufficiency fracture, stable from priors. No acute fracture or traumatic osseous injury of the lumbar spine, bony pelvis or proximal femora. Multilevel discogenic and facet degenerative changes in the lumbar spine, maximal L5-S1. Additional degenerative changes in the hips and pelvis. IMPRESSION: CT HEAD 1. Left frontal scalp contusion without subjacent calvarial fracture. 2. Postsurgical changes at the vertex from recent craniotomy at the site of meningioma resection with residual hyperdense tumor along the resection margin adjacent the superior sagittal sinus. There is an overlying air and fluid containing collection superficial to the craniotomy bone flap as well as additional mixed attenuation extra-axial fluid extending across the vertex and left falx. 3. Mild sulcal effacement throughout the cerebral hemisphere without significant midline shift. 4. Slight interval increase in the degree of vasogenic edema within the right frontal and parietal lobes. 5. No other sites of hyperdense hemorrhage or other acute intracranial abnormality. 6.  Background mild parenchymal volume loss and microvascular angiopathy. CT CERVICAL SPINE 1. No acute fracture or traumatic osseous injury of the cervical spine. 2. Multilevel cervical spondylitic changes as detailed above. 3. Cervical carotid atherosclerosis. CT CHEST, ABDOMEN AND PELVIS 1. No acute traumatic injury to the chest. 2. No acute traumatic injury to the abdomen or pelvis. 3. Some mild  rectal wall thickening with large inspissated stool ball measuring up to 7.9 cm in diameter with associated faint perirectal stranding. Correlate for signs and symptoms of stercoral colitis. 4. Colonic diverticulosis without evidence of acute diverticulitis. 5. Fibroid uterus. 6.  Emphysema (ICD10-J43.9).  Mild bronchitic changes as well. 7. Aortic Atherosclerosis (ICD10-I70.0). 8. 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 Recommend annual imaging followup by CTA or MRA. This recommendation follows 2010 ACCF/AHA/AATS/ACR/ASA/SCA/SCAI/SIR/STS/SVM Guidelines for the Diagnosis and Management of Patients with Thoracic Aortic Disease. Circulation. 2010; 121: B284-X324. Aortic aneurysm NOS (ICD10-I71.9) Critical Value/emergent results were called by telephone at the time of interpretation on 01/02/2020 at 2:47 am to provider Dr. Kieth Brightly , who verbally acknowledged these results. Electronically Signed   By: Lovena Le M.D.   On: 01/02/2020 02:47   CT CERVICAL SPINE WO CONTRAST  Result Date: 01/02/2020 CLINICAL DATA:  Fall on blood thinners, left periorbital hematoma EXAM: CT HEAD WITHOUT CONTRAST CT CERVICAL SPINE WITHOUT CONTRAST CT CHEST, ABDOMEN AND PELVIS WITH CONTRAST TECHNIQUE: Contiguous axial images were obtained from the base of the skull through the vertex without intravenous contrast. Multidetector CT imaging of the cervical spine was performed without intravenous contrast. Multiplanar CT image reconstructions were also generated. Multidetector CT imaging of the chest, abdomen and pelvis was performed following the standard protocol during bolus administration of intravenous contrast. CONTRAST:  134mL OMNIPAQUE IOHEXOL 300 MG/ML  SOLN COMPARISON:  Numerous priors, most recently head 12/26/2019, CT head 11/23/2019 CT chest, abdomen and pelvis 11/22/2019 FINDINGS: CT HEAD FINDINGS Brain: Extensive vasogenic edema throughout the right frontal and parietal lobes, minimally increased from recent  MRI and CT. 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 There is an overlying air and fluid containing collection superficial to the craniotomy bone flap as well as additional mixed attenuation air and fluid extra-axial collection extending across the vertex and along the left falx. Hyperdensity along the adjacent sulci could reflect reactive dural thickening or hemorrhage. Some associated resulting mass effect along the right cerebral hemisphere with slight global sulcal effacement with features suggesting more diffuse volume loss elsewhere in the brain. No significant midline shift or transtentorial migration. Basal cisterns are patent. Vascular: Atherosclerotic calcification of the carotid siphons. No hyperdense vessel. Skull: Postsurgical changes at the vertex from recent craniotomy as above with overlying and subjacent swelling and hematoma. Additional site of left frontal scalp swelling likely related to the acute trauma is noted with a small crescentic scalp hematoma measuring up to 5 mm in maximal thickness. No subjacent calvarial fracture. Other scalp hematoma. Chronic deformities of the nasal bones are similar to prior. Sinuses/Orbits: Paranasal sinuses and mastoid air cells are predominantly clear. No retro septal gas, stranding or hemorrhage. Included orbital structures are otherwise unremarkable. Other: None CT CERVICAL SPINE FINDINGS Alignment: Stabilization collar in place. Straightening of the normal cervical lordosis. Mild rightward cranial rotation. No evidence of traumatic listhesis. No abnormally widened, perched or jumped facets. Normal alignment of the craniocervical and atlantoaxial articulations accounting for degree of cranial rotation. Skull base and vertebrae: No visible skull  base fracture. No vertebral body fracture or height loss. Posterior elements are intact. Multilevel  spondylitic changes, uncinate spurring and facet degenerative change, detailed below. No acute or worrisome osseous lesion. Soft tissues and spinal canal: No pre or paravertebral fluid or swelling. No visible canal hematoma. Disc levels: Multilevel intervertebral disc height loss with cervical spondylitic changes maximal C5-C7. Posterior disc osteophyte complexes are present resulting at most maximal mild canal stenosis. Multilevel uncinate spurring and facet hypertrophic changes result in mild-to-moderate multilevel neural foraminal narrowing as well. No severe canal stenosis or foraminal impingement. Other: Cervical carotid atherosclerosis. CT CHEST FINDINGS Cardiovascular: The aortic root is suboptimally assessed given cardiac pulsation artifact. Calcifications of the aortic leaflets. Borderline dilated ascending thoracic aorta at 4 cm. Proximal dilatation of the descending thoracic aorta is similar to prior measuring up to 3.3 cm in diameter and 3.5 cm at the level of the diaphragm. No acute luminal abnormality. No periaortic stranding or hemorrhage. Normal 3 vessel branching of the aortic arch. Minimal plaque in the proximal great vessels without other significant luminal abnormality or acute finding. Central pulmonary arteries are borderline enlarged. No large central or lobar filling defects on this non tailored examination of the pulmonary arteries. Normal heart size. Coronary artery calcifications are present. No pericardial effusion. Mediastinum/Nodes: No mediastinal fluid or gas. Normal thyroid gland and thoracic inlet. No acute abnormality of the trachea or esophagus. No worrisome mediastinal, hilar or axillary adenopathy. Lungs/Pleura: No acute traumatic parenchymal injury. No pneumothorax or effusion. Dependent atelectatic changes. Stable multifocal areas of bandlike opacity in the mid to lower lungs compatible with areas of cicatricial or atelectatic change. No focal consolidation or features of  edema. Mild bronchitic airways thickening. 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 No acute rib fractures. No acute osseous injury of the chest wall elsewhere nor in the thoracic spine or included shoulders. No large body wall hematoma or other acute traumatic injury of the soft tissues. CT ABDOMEN PELVIS FINDINGS Hepatobiliary: No direct hepatic injury or perihepatic hematoma. No worrisome focal liver abnormality is seen. Normal gallbladder. No visible calcified gallstones. No biliary ductal dilatation. Pancreas: No pancreatic contusive change or ductal disruption. No peripancreatic inflammation or discernible lesions. Spleen: Normal splenic size. Stable hypoattenuating cleft at the superior spleen. No direct splenic injury or perisplenic hematoma. Adrenals/Urinary Tract: Normal adrenal glands without evidence of hemorrhage. No direct renal injury or perinephric hemorrhage. Slight non rotation of the left kidney, benign anatomic variant. No concerning renal mass, urolithiasis or hydronephrosis. Urinary bladder is unremarkable. Stomach/Bowel: Distal esophagus, stomach and duodenum are free of acute abnormality. A normal appendix is visualized. Moderate colonic stool burden without proximal thickening or distension. Scattered colonic diverticula without focal inflammation to suggest diverticulitis. Some mild rectal wall thickening with large inspissated stool ball measuring up to 7.9 cm in diameter with associated faint perirectal stranding. Vascular/Lymphatic: No acute vascular injury or contrast extravasation in the abdomen or pelvis. Atherosclerotic calcifications within the abdominal aorta and branch vessels. No aneurysm or ectasia. No enlarged abdominopelvic lymph nodes. Reproductive: Anteverted uterus with calcified subserosal fibroid. No concerning adnexal lesions. Other: No large body wall hematoma or retroperitoneal hemorrhage. No traumatic abdominal wall  dehiscence. No abdominopelvic free air or fluid. Perirectal stranding, as above. Musculoskeletal: Sclerotic bands along the medial ilia may ring the SI joint may reflect sequela of remote pelvic insufficiency fracture, stable from priors. No acute fracture or traumatic osseous injury of the lumbar spine, bony pelvis or proximal femora. Multilevel discogenic  and facet degenerative changes in the lumbar spine, maximal L5-S1. Additional degenerative changes in the hips and pelvis. IMPRESSION: CT HEAD 1. Left frontal scalp contusion without subjacent calvarial fracture. 2. Postsurgical changes at the vertex from recent craniotomy at the site of meningioma resection with residual hyperdense tumor along the resection margin adjacent the superior sagittal sinus. There is an overlying air and fluid containing collection superficial to the craniotomy bone flap as well as additional mixed attenuation extra-axial fluid extending across the vertex and left falx. 3. Mild sulcal effacement throughout the cerebral hemisphere without significant midline shift. 4. Slight interval increase in the degree of vasogenic edema within the right frontal and parietal lobes. 5. No other sites of hyperdense hemorrhage or other acute intracranial abnormality. 6. Background mild parenchymal volume loss and microvascular angiopathy. CT CERVICAL SPINE 1. No acute fracture or traumatic osseous injury of the cervical spine. 2. Multilevel cervical spondylitic changes as detailed above. 3. Cervical carotid atherosclerosis. CT CHEST, ABDOMEN AND PELVIS 1. No acute traumatic injury to the chest. 2. No acute traumatic injury to the abdomen or pelvis. 3. Some mild rectal wall thickening with large inspissated stool ball measuring up to 7.9 cm in diameter with associated faint perirectal stranding. Correlate for signs and symptoms of stercoral colitis. 4. Colonic diverticulosis without evidence of acute diverticulitis. 5. Fibroid uterus. 6.  Emphysema  (ICD10-J43.9).  Mild bronchitic changes as well. 7. Aortic Atherosclerosis (ICD10-I70.0). 8. 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 Recommend annual imaging followup by CTA or MRA. This recommendation follows 2010 ACCF/AHA/AATS/ACR/ASA/SCA/SCAI/SIR/STS/SVM Guidelines for the Diagnosis and Management of Patients with Thoracic Aortic Disease. Circulation. 2010; 121: Z308-M578. Aortic aneurysm NOS (ICD10-I71.9) Critical Value/emergent results were called by telephone at the time of interpretation on 01/02/2020 at 2:47 am to provider Dr. Kieth Brightly , who verbally acknowledged these results. Electronically Signed   By: Lovena Le M.D.   On: 01/02/2020 02:47   CT ABDOMEN PELVIS W CONTRAST  Result Date: 01/02/2020 CLINICAL DATA:  Fall on blood thinners, left periorbital hematoma EXAM: CT HEAD WITHOUT CONTRAST CT CERVICAL SPINE WITHOUT CONTRAST CT CHEST, ABDOMEN AND PELVIS WITH CONTRAST TECHNIQUE: Contiguous axial images were obtained from the base of the skull through the vertex without intravenous contrast. Multidetector CT imaging of the cervical spine was performed without intravenous contrast. Multiplanar CT image reconstructions were also generated. Multidetector CT imaging of the chest, abdomen and pelvis was performed following the standard protocol during bolus administration of intravenous contrast. CONTRAST:  159mL OMNIPAQUE IOHEXOL 300 MG/ML  SOLN COMPARISON:  Numerous priors, most recently head 12/26/2019, CT head 11/23/2019 CT chest, abdomen and pelvis 11/22/2019 FINDINGS: CT HEAD FINDINGS Brain: Extensive vasogenic edema throughout the right frontal and parietal lobes, minimally increased from recent MRI and CT. 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 There is an overlying air and fluid containing collection superficial to the craniotomy bone flap as well as additional  mixed attenuation air and fluid extra-axial collection extending across the vertex and along the left falx. Hyperdensity along the adjacent sulci could reflect reactive dural thickening or hemorrhage. Some associated resulting mass effect along the right cerebral hemisphere with slight global sulcal effacement with features suggesting more diffuse volume loss elsewhere in the brain. No significant midline shift or transtentorial migration. Basal cisterns are patent. Vascular: Atherosclerotic calcification of the carotid siphons. No hyperdense vessel. Skull: Postsurgical changes at the vertex from recent craniotomy as above  with overlying and subjacent swelling and hematoma. Additional site of left frontal scalp swelling likely related to the acute trauma is noted with a small crescentic scalp hematoma measuring up to 5 mm in maximal thickness. No subjacent calvarial fracture. Other scalp hematoma. Chronic deformities of the nasal bones are similar to prior. Sinuses/Orbits: Paranasal sinuses and mastoid air cells are predominantly clear. No retro septal gas, stranding or hemorrhage. Included orbital structures are otherwise unremarkable. Other: None CT CERVICAL SPINE FINDINGS Alignment: Stabilization collar in place. Straightening of the normal cervical lordosis. Mild rightward cranial rotation. No evidence of traumatic listhesis. No abnormally widened, perched or jumped facets. Normal alignment of the craniocervical and atlantoaxial articulations accounting for degree of cranial rotation. Skull base and vertebrae: No visible skull base fracture. No vertebral body fracture or height loss. Posterior elements are intact. Multilevel spondylitic changes, uncinate spurring and facet degenerative change, detailed below. No acute or worrisome osseous lesion. Soft tissues and spinal canal: No pre or paravertebral fluid or swelling. No visible canal hematoma. Disc levels: Multilevel intervertebral disc height loss with  cervical spondylitic changes maximal C5-C7. Posterior disc osteophyte complexes are present resulting at most maximal mild canal stenosis. Multilevel uncinate spurring and facet hypertrophic changes result in mild-to-moderate multilevel neural foraminal narrowing as well. No severe canal stenosis or foraminal impingement. Other: Cervical carotid atherosclerosis. CT CHEST FINDINGS Cardiovascular: The aortic root is suboptimally assessed given cardiac pulsation artifact. Calcifications of the aortic leaflets. Borderline dilated ascending thoracic aorta at 4 cm. Proximal dilatation of the descending thoracic aorta is similar to prior measuring up to 3.3 cm in diameter and 3.5 cm at the level of the diaphragm. No acute luminal abnormality. No periaortic stranding or hemorrhage. Normal 3 vessel branching of the aortic arch. Minimal plaque in the proximal great vessels without other significant luminal abnormality or acute finding. Central pulmonary arteries are borderline enlarged. No large central or lobar filling defects on this non tailored examination of the pulmonary arteries. Normal heart size. Coronary artery calcifications are present. No pericardial effusion. Mediastinum/Nodes: No mediastinal fluid or gas. Normal thyroid gland and thoracic inlet. No acute abnormality of the trachea or esophagus. No worrisome mediastinal, hilar or axillary adenopathy. Lungs/Pleura: No acute traumatic parenchymal injury. No pneumothorax or effusion. Dependent atelectatic changes. Stable multifocal areas of bandlike opacity in the mid to lower lungs compatible with areas of cicatricial or atelectatic change. No focal consolidation or features of edema. Mild bronchitic airways thickening. 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 No acute rib fractures. No acute osseous injury of the chest wall elsewhere nor in the thoracic spine or included shoulders. No large body wall hematoma or  other acute traumatic injury of the soft tissues. CT ABDOMEN PELVIS FINDINGS Hepatobiliary: No direct hepatic injury or perihepatic hematoma. No worrisome focal liver abnormality is seen. Normal gallbladder. No visible calcified gallstones. No biliary ductal dilatation. Pancreas: No pancreatic contusive change or ductal disruption. No peripancreatic inflammation or discernible lesions. Spleen: Normal splenic size. Stable hypoattenuating cleft at the superior spleen. No direct splenic injury or perisplenic hematoma. Adrenals/Urinary Tract: Normal adrenal glands without evidence of hemorrhage. No direct renal injury or perinephric hemorrhage. Slight non rotation of the left kidney, benign anatomic variant. No concerning renal mass, urolithiasis or hydronephrosis. Urinary bladder is unremarkable. Stomach/Bowel: Distal esophagus, stomach and duodenum are free of acute abnormality. A normal appendix is visualized. Moderate colonic stool burden without proximal thickening or distension. Scattered colonic diverticula without focal inflammation to suggest diverticulitis.  Some mild rectal wall thickening with large inspissated stool ball measuring up to 7.9 cm in diameter with associated faint perirectal stranding. Vascular/Lymphatic: No acute vascular injury or contrast extravasation in the abdomen or pelvis. Atherosclerotic calcifications within the abdominal aorta and branch vessels. No aneurysm or ectasia. No enlarged abdominopelvic lymph nodes. Reproductive: Anteverted uterus with calcified subserosal fibroid. No concerning adnexal lesions. Other: No large body wall hematoma or retroperitoneal hemorrhage. No traumatic abdominal wall dehiscence. No abdominopelvic free air or fluid. Perirectal stranding, as above. Musculoskeletal: Sclerotic bands along the medial ilia may ring the SI joint may reflect sequela of remote pelvic insufficiency fracture, stable from priors. No acute fracture or traumatic osseous injury of the  lumbar spine, bony pelvis or proximal femora. Multilevel discogenic and facet degenerative changes in the lumbar spine, maximal L5-S1. Additional degenerative changes in the hips and pelvis. IMPRESSION: CT HEAD 1. Left frontal scalp contusion without subjacent calvarial fracture. 2. Postsurgical changes at the vertex from recent craniotomy at the site of meningioma resection with residual hyperdense tumor along the resection margin adjacent the superior sagittal sinus. There is an overlying air and fluid containing collection superficial to the craniotomy bone flap as well as additional mixed attenuation extra-axial fluid extending across the vertex and left falx. 3. Mild sulcal effacement throughout the cerebral hemisphere without significant midline shift. 4. Slight interval increase in the degree of vasogenic edema within the right frontal and parietal lobes. 5. No other sites of hyperdense hemorrhage or other acute intracranial abnormality. 6. Background mild parenchymal volume loss and microvascular angiopathy. CT CERVICAL SPINE 1. No acute fracture or traumatic osseous injury of the cervical spine. 2. Multilevel cervical spondylitic changes as detailed above. 3. Cervical carotid atherosclerosis. CT CHEST, ABDOMEN AND PELVIS 1. No acute traumatic injury to the chest. 2. No acute traumatic injury to the abdomen or pelvis. 3. Some mild rectal wall thickening with large inspissated stool ball measuring up to 7.9 cm in diameter with associated faint perirectal stranding. Correlate for signs and symptoms of stercoral colitis. 4. Colonic diverticulosis without evidence of acute diverticulitis. 5. Fibroid uterus. 6.  Emphysema (ICD10-J43.9).  Mild bronchitic changes as well. 7. Aortic Atherosclerosis (ICD10-I70.0). 8. 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 Recommend annual imaging followup by CTA or MRA. This recommendation follows 2010 ACCF/AHA/AATS/ACR/ASA/SCA/SCAI/SIR/STS/SVM Guidelines for the  Diagnosis and Management of Patients with Thoracic Aortic Disease. Circulation. 2010; 121: I144-R154. Aortic aneurysm NOS (ICD10-I71.9) Critical Value/emergent results were called by telephone at the time of interpretation on 01/02/2020 at 2:47 am to provider Dr. Kieth Brightly , who verbally acknowledged these results. Electronically Signed   By: Lovena Le M.D.   On: 01/02/2020 02:47   DG Pelvis Portable  Result Date: 01/02/2020 CLINICAL DATA:  Level 1 trauma, fall blood thinners, forehead hematoma, hypotensive EXAM: PORTABLE PELVIS 1-2 VIEWS COMPARISON:  Radiograph 11/18/2019 FINDINGS: The bones of the pelvis appear intact and congruent. Evaluation the sacrum is limited due to overlying bowel gas. No clear disruption of the sacral arcs is seen. Proximal femora are normally located and intact as well. Additional degenerative changes noted throughout the lower lumbar spine, hips and pelvis. Telemetry leads overlie the pelvis. IMPRESSION: 1. No acute osseous abnormality. 2. Mild to moderate degenerative changes in the hips and pelvis. Electronically Signed   By: Lovena Le M.D.   On: 01/02/2020 01:51   DG Chest Port 1 View  Result Date: 01/02/2020 CLINICAL DATA:  Initial evaluation for acute trauma, fall. EXAM: PORTABLE CHEST 1 VIEW COMPARISON:  Prior radiograph from 11/21/2019. FINDINGS: Cardiomegaly, stable.  Mediastinal silhouette within normal limits. Lungs mildly hypoinflated. Streaky opacity involving the mid left lung again noted, stable from previous, likely reflecting post treatment changes. No other focal airspace disease. No edema or effusion. No pneumothorax. No acute osseous abnormality. Remote fracture deformity of the left posterior 6 rib noted, stable. IMPRESSION: Stable appearance of the chest. No active cardiopulmonary disease identified. Electronically Signed   By: Jeannine Boga M.D.   On: 01/02/2020 01:51    EKG See muse  Radiology CT HEAD WO CONTRAST  Result Date:  01/02/2020 CLINICAL DATA:  Fall on blood thinners, left periorbital hematoma EXAM: CT HEAD WITHOUT CONTRAST CT CERVICAL SPINE WITHOUT CONTRAST CT CHEST, ABDOMEN AND PELVIS WITH CONTRAST TECHNIQUE: Contiguous axial images were obtained from the base of the skull through the vertex without intravenous contrast. Multidetector CT imaging of the cervical spine was performed without intravenous contrast. Multiplanar CT image reconstructions were also generated. Multidetector CT imaging of the chest, abdomen and pelvis was performed following the standard protocol during bolus administration of intravenous contrast. CONTRAST:  14mL OMNIPAQUE IOHEXOL 300 MG/ML  SOLN COMPARISON:  Numerous priors, most recently head 12/26/2019, CT head 11/23/2019 CT chest, abdomen and pelvis 11/22/2019 FINDINGS: CT HEAD FINDINGS Brain: Extensive vasogenic edema throughout the right frontal and parietal lobes, minimally increased from recent MRI and CT. 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 There is an overlying air and fluid containing collection superficial to the craniotomy bone flap as well as additional mixed attenuation air and fluid extra-axial collection extending across the vertex and along the left falx. Hyperdensity along the adjacent sulci could reflect reactive dural thickening or hemorrhage. Some associated resulting mass effect along the right cerebral hemisphere with slight global sulcal effacement with features suggesting more diffuse volume loss elsewhere in the brain. No significant midline shift or transtentorial migration. Basal cisterns are patent. Vascular: Atherosclerotic calcification of the carotid siphons. No hyperdense vessel. Skull: Postsurgical changes at the vertex from recent craniotomy as above with overlying and subjacent swelling and hematoma. Additional site of left frontal scalp swelling  likely related to the acute trauma is noted with a small crescentic scalp hematoma measuring up to 5 mm in maximal thickness. No subjacent calvarial fracture. Other scalp hematoma. Chronic deformities of the nasal bones are similar to prior. Sinuses/Orbits: Paranasal sinuses and mastoid air cells are predominantly clear. No retro septal gas, stranding or hemorrhage. Included orbital structures are otherwise unremarkable. Other: None CT CERVICAL SPINE FINDINGS Alignment: Stabilization collar in place. Straightening of the normal cervical lordosis. Mild rightward cranial rotation. No evidence of traumatic listhesis. No abnormally widened, perched or jumped facets. Normal alignment of the craniocervical and atlantoaxial articulations accounting for degree of cranial rotation. Skull base and vertebrae: No visible skull base fracture. No vertebral body fracture or height loss. Posterior elements are intact. Multilevel spondylitic changes, uncinate spurring and facet degenerative change, detailed below. No acute or worrisome osseous lesion. Soft tissues and spinal canal: No pre or paravertebral fluid or swelling. No visible canal hematoma. Disc levels: Multilevel intervertebral disc height loss with cervical spondylitic changes maximal C5-C7. Posterior disc osteophyte complexes are present resulting at most maximal mild canal stenosis. Multilevel uncinate spurring and facet hypertrophic changes result in mild-to-moderate multilevel neural foraminal narrowing as well. No severe canal stenosis or foraminal impingement. Other: Cervical carotid atherosclerosis. CT CHEST FINDINGS Cardiovascular: The aortic root is suboptimally  assessed given cardiac pulsation artifact. Calcifications of the aortic leaflets. Borderline dilated ascending thoracic aorta at 4 cm. Proximal dilatation of the descending thoracic aorta is similar to prior measuring up to 3.3 cm in diameter and 3.5 cm at the level of the diaphragm. No acute luminal  abnormality. No periaortic stranding or hemorrhage. Normal 3 vessel branching of the aortic arch. Minimal plaque in the proximal great vessels without other significant luminal abnormality or acute finding. Central pulmonary arteries are borderline enlarged. No large central or lobar filling defects on this non tailored examination of the pulmonary arteries. Normal heart size. Coronary artery calcifications are present. No pericardial effusion. Mediastinum/Nodes: No mediastinal fluid or gas. Normal thyroid gland and thoracic inlet. No acute abnormality of the trachea or esophagus. No worrisome mediastinal, hilar or axillary adenopathy. Lungs/Pleura: No acute traumatic parenchymal injury. No pneumothorax or effusion. Dependent atelectatic changes. Stable multifocal areas of bandlike opacity in the mid to lower lungs compatible with areas of cicatricial or atelectatic change. No focal consolidation or features of edema. Mild bronchitic airways thickening. 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 No acute rib fractures. No acute osseous injury of the chest wall elsewhere nor in the thoracic spine or included shoulders. No large body wall hematoma or other acute traumatic injury of the soft tissues. CT ABDOMEN PELVIS FINDINGS Hepatobiliary: No direct hepatic injury or perihepatic hematoma. No worrisome focal liver abnormality is seen. Normal gallbladder. No visible calcified gallstones. No biliary ductal dilatation. Pancreas: No pancreatic contusive change or ductal disruption. No peripancreatic inflammation or discernible lesions. Spleen: Normal splenic size. Stable hypoattenuating cleft at the superior spleen. No direct splenic injury or perisplenic hematoma. Adrenals/Urinary Tract: Normal adrenal glands without evidence of hemorrhage. No direct renal injury or perinephric hemorrhage. Slight non rotation of the left kidney, benign anatomic variant. No concerning renal  mass, urolithiasis or hydronephrosis. Urinary bladder is unremarkable. Stomach/Bowel: Distal esophagus, stomach and duodenum are free of acute abnormality. A normal appendix is visualized. Moderate colonic stool burden without proximal thickening or distension. Scattered colonic diverticula without focal inflammation to suggest diverticulitis. Some mild rectal wall thickening with large inspissated stool ball measuring up to 7.9 cm in diameter with associated faint perirectal stranding. Vascular/Lymphatic: No acute vascular injury or contrast extravasation in the abdomen or pelvis. Atherosclerotic calcifications within the abdominal aorta and branch vessels. No aneurysm or ectasia. No enlarged abdominopelvic lymph nodes. Reproductive: Anteverted uterus with calcified subserosal fibroid. No concerning adnexal lesions. Other: No large body wall hematoma or retroperitoneal hemorrhage. No traumatic abdominal wall dehiscence. No abdominopelvic free air or fluid. Perirectal stranding, as above. Musculoskeletal: Sclerotic bands along the medial ilia may ring the SI joint may reflect sequela of remote pelvic insufficiency fracture, stable from priors. No acute fracture or traumatic osseous injury of the lumbar spine, bony pelvis or proximal femora. Multilevel discogenic and facet degenerative changes in the lumbar spine, maximal L5-S1. Additional degenerative changes in the hips and pelvis. IMPRESSION: CT HEAD 1. Left frontal scalp contusion without subjacent calvarial fracture. 2. Postsurgical changes at the vertex from recent craniotomy at the site of meningioma resection with residual hyperdense tumor along the resection margin adjacent the superior sagittal sinus. There is an overlying air and fluid containing collection superficial to the craniotomy bone flap as well as additional mixed attenuation extra-axial fluid extending across the vertex and left falx. 3. Mild sulcal effacement throughout the cerebral hemisphere  without significant midline shift. 4. Slight interval increase in the degree of  vasogenic edema within the right frontal and parietal lobes. 5. No other sites of hyperdense hemorrhage or other acute intracranial abnormality. 6. Background mild parenchymal volume loss and microvascular angiopathy. CT CERVICAL SPINE 1. No acute fracture or traumatic osseous injury of the cervical spine. 2. Multilevel cervical spondylitic changes as detailed above. 3. Cervical carotid atherosclerosis. CT CHEST, ABDOMEN AND PELVIS 1. No acute traumatic injury to the chest. 2. No acute traumatic injury to the abdomen or pelvis. 3. Some mild rectal wall thickening with large inspissated stool ball measuring up to 7.9 cm in diameter with associated faint perirectal stranding. Correlate for signs and symptoms of stercoral colitis. 4. Colonic diverticulosis without evidence of acute diverticulitis. 5. Fibroid uterus. 6.  Emphysema (ICD10-J43.9).  Mild bronchitic changes as well. 7. Aortic Atherosclerosis (ICD10-I70.0). 8. 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 Recommend annual imaging followup by CTA or MRA. This recommendation follows 2010 ACCF/AHA/AATS/ACR/ASA/SCA/SCAI/SIR/STS/SVM Guidelines for the Diagnosis and Management of Patients with Thoracic Aortic Disease. Circulation. 2010; 121: E751-Z001. Aortic aneurysm NOS (ICD10-I71.9) Critical Value/emergent results were called by telephone at the time of interpretation on 01/02/2020 at 2:47 am to provider Dr. Kieth Brightly , who verbally acknowledged these results. Electronically Signed   By: Lovena Le M.D.   On: 01/02/2020 02:47   CT CHEST W CONTRAST  Result Date: 01/02/2020 CLINICAL DATA:  Fall on blood thinners, left periorbital hematoma EXAM: CT HEAD WITHOUT CONTRAST CT CERVICAL SPINE WITHOUT CONTRAST CT CHEST, ABDOMEN AND PELVIS WITH CONTRAST TECHNIQUE: Contiguous axial images were obtained from the base of the skull through the vertex without intravenous contrast.  Multidetector CT imaging of the cervical spine was performed without intravenous contrast. Multiplanar CT image reconstructions were also generated. Multidetector CT imaging of the chest, abdomen and pelvis was performed following the standard protocol during bolus administration of intravenous contrast. CONTRAST:  160mL OMNIPAQUE IOHEXOL 300 MG/ML  SOLN COMPARISON:  Numerous priors, most recently head 12/26/2019, CT head 11/23/2019 CT chest, abdomen and pelvis 11/22/2019 FINDINGS: CT HEAD FINDINGS Brain: Extensive vasogenic edema throughout the right frontal and parietal lobes, minimally increased from recent MRI and CT. 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 There is an overlying air and fluid containing collection superficial to the craniotomy bone flap as well as additional mixed attenuation air and fluid extra-axial collection extending across the vertex and along the left falx. Hyperdensity along the adjacent sulci could reflect reactive dural thickening or hemorrhage. Some associated resulting mass effect along the right cerebral hemisphere with slight global sulcal effacement with features suggesting more diffuse volume loss elsewhere in the brain. No significant midline shift or transtentorial migration. Basal cisterns are patent. Vascular: Atherosclerotic calcification of the carotid siphons. No hyperdense vessel. Skull: Postsurgical changes at the vertex from recent craniotomy as above with overlying and subjacent swelling and hematoma. Additional site of left frontal scalp swelling likely related to the acute trauma is noted with a small crescentic scalp hematoma measuring up to 5 mm in maximal thickness. No subjacent calvarial fracture. Other scalp hematoma. Chronic deformities of the nasal bones are similar to prior. Sinuses/Orbits: Paranasal sinuses and mastoid air cells are  predominantly clear. No retro septal gas, stranding or hemorrhage. Included orbital structures are otherwise unremarkable. Other: None CT CERVICAL SPINE FINDINGS Alignment: Stabilization collar in place. Straightening of the normal cervical lordosis. Mild rightward cranial rotation. No evidence of traumatic listhesis. No abnormally widened, perched or jumped facets.  Normal alignment of the craniocervical and atlantoaxial articulations accounting for degree of cranial rotation. Skull base and vertebrae: No visible skull base fracture. No vertebral body fracture or height loss. Posterior elements are intact. Multilevel spondylitic changes, uncinate spurring and facet degenerative change, detailed below. No acute or worrisome osseous lesion. Soft tissues and spinal canal: No pre or paravertebral fluid or swelling. No visible canal hematoma. Disc levels: Multilevel intervertebral disc height loss with cervical spondylitic changes maximal C5-C7. Posterior disc osteophyte complexes are present resulting at most maximal mild canal stenosis. Multilevel uncinate spurring and facet hypertrophic changes result in mild-to-moderate multilevel neural foraminal narrowing as well. No severe canal stenosis or foraminal impingement. Other: Cervical carotid atherosclerosis. CT CHEST FINDINGS Cardiovascular: The aortic root is suboptimally assessed given cardiac pulsation artifact. Calcifications of the aortic leaflets. Borderline dilated ascending thoracic aorta at 4 cm. Proximal dilatation of the descending thoracic aorta is similar to prior measuring up to 3.3 cm in diameter and 3.5 cm at the level of the diaphragm. No acute luminal abnormality. No periaortic stranding or hemorrhage. Normal 3 vessel branching of the aortic arch. Minimal plaque in the proximal great vessels without other significant luminal abnormality or acute finding. Central pulmonary arteries are borderline enlarged. No large central or lobar filling defects on  this non tailored examination of the pulmonary arteries. Normal heart size. Coronary artery calcifications are present. No pericardial effusion. Mediastinum/Nodes: No mediastinal fluid or gas. Normal thyroid gland and thoracic inlet. No acute abnormality of the trachea or esophagus. No worrisome mediastinal, hilar or axillary adenopathy. Lungs/Pleura: No acute traumatic parenchymal injury. No pneumothorax or effusion. Dependent atelectatic changes. Stable multifocal areas of bandlike opacity in the mid to lower lungs compatible with areas of cicatricial or atelectatic change. No focal consolidation or features of edema. Mild bronchitic airways thickening. 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 No acute rib fractures. No acute osseous injury of the chest wall elsewhere nor in the thoracic spine or included shoulders. No large body wall hematoma or other acute traumatic injury of the soft tissues. CT ABDOMEN PELVIS FINDINGS Hepatobiliary: No direct hepatic injury or perihepatic hematoma. No worrisome focal liver abnormality is seen. Normal gallbladder. No visible calcified gallstones. No biliary ductal dilatation. Pancreas: No pancreatic contusive change or ductal disruption. No peripancreatic inflammation or discernible lesions. Spleen: Normal splenic size. Stable hypoattenuating cleft at the superior spleen. No direct splenic injury or perisplenic hematoma. Adrenals/Urinary Tract: Normal adrenal glands without evidence of hemorrhage. No direct renal injury or perinephric hemorrhage. Slight non rotation of the left kidney, benign anatomic variant. No concerning renal mass, urolithiasis or hydronephrosis. Urinary bladder is unremarkable. Stomach/Bowel: Distal esophagus, stomach and duodenum are free of acute abnormality. A normal appendix is visualized. Moderate colonic stool burden without proximal thickening or distension. Scattered colonic diverticula without focal  inflammation to suggest diverticulitis. Some mild rectal wall thickening with large inspissated stool ball measuring up to 7.9 cm in diameter with associated faint perirectal stranding. Vascular/Lymphatic: No acute vascular injury or contrast extravasation in the abdomen or pelvis. Atherosclerotic calcifications within the abdominal aorta and branch vessels. No aneurysm or ectasia. No enlarged abdominopelvic lymph nodes. Reproductive: Anteverted uterus with calcified subserosal fibroid. No concerning adnexal lesions. Other: No large body wall hematoma or retroperitoneal hemorrhage. No traumatic abdominal wall dehiscence. No abdominopelvic free air or fluid. Perirectal stranding, as above. Musculoskeletal: Sclerotic bands along the medial ilia may ring the SI joint may reflect sequela of remote pelvic insufficiency fracture, stable  from priors. No acute fracture or traumatic osseous injury of the lumbar spine, bony pelvis or proximal femora. Multilevel discogenic and facet degenerative changes in the lumbar spine, maximal L5-S1. Additional degenerative changes in the hips and pelvis. IMPRESSION: CT HEAD 1. Left frontal scalp contusion without subjacent calvarial fracture. 2. Postsurgical changes at the vertex from recent craniotomy at the site of meningioma resection with residual hyperdense tumor along the resection margin adjacent the superior sagittal sinus. There is an overlying air and fluid containing collection superficial to the craniotomy bone flap as well as additional mixed attenuation extra-axial fluid extending across the vertex and left falx. 3. Mild sulcal effacement throughout the cerebral hemisphere without significant midline shift. 4. Slight interval increase in the degree of vasogenic edema within the right frontal and parietal lobes. 5. No other sites of hyperdense hemorrhage or other acute intracranial abnormality. 6. Background mild parenchymal volume loss and microvascular angiopathy. CT  CERVICAL SPINE 1. No acute fracture or traumatic osseous injury of the cervical spine. 2. Multilevel cervical spondylitic changes as detailed above. 3. Cervical carotid atherosclerosis. CT CHEST, ABDOMEN AND PELVIS 1. No acute traumatic injury to the chest. 2. No acute traumatic injury to the abdomen or pelvis. 3. Some mild rectal wall thickening with large inspissated stool ball measuring up to 7.9 cm in diameter with associated faint perirectal stranding. Correlate for signs and symptoms of stercoral colitis. 4. Colonic diverticulosis without evidence of acute diverticulitis. 5. Fibroid uterus. 6.  Emphysema (ICD10-J43.9).  Mild bronchitic changes as well. 7. Aortic Atherosclerosis (ICD10-I70.0). 8. 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 Recommend annual imaging followup by CTA or MRA. This recommendation follows 2010 ACCF/AHA/AATS/ACR/ASA/SCA/SCAI/SIR/STS/SVM Guidelines for the Diagnosis and Management of Patients with Thoracic Aortic Disease. Circulation. 2010; 121: T035-W656. Aortic aneurysm NOS (ICD10-I71.9) Critical Value/emergent results were called by telephone at the time of interpretation on 01/02/2020 at 2:47 am to provider Dr. Kieth Brightly , who verbally acknowledged these results. Electronically Signed   By: Lovena Le M.D.   On: 01/02/2020 02:47   CT CERVICAL SPINE WO CONTRAST  Result Date: 01/02/2020 CLINICAL DATA:  Fall on blood thinners, left periorbital hematoma EXAM: CT HEAD WITHOUT CONTRAST CT CERVICAL SPINE WITHOUT CONTRAST CT CHEST, ABDOMEN AND PELVIS WITH CONTRAST TECHNIQUE: Contiguous axial images were obtained from the base of the skull through the vertex without intravenous contrast. Multidetector CT imaging of the cervical spine was performed without intravenous contrast. Multiplanar CT image reconstructions were also generated. Multidetector CT imaging of the chest, abdomen and pelvis was performed following the standard protocol during bolus administration of  intravenous contrast. CONTRAST:  168mL OMNIPAQUE IOHEXOL 300 MG/ML  SOLN COMPARISON:  Numerous priors, most recently head 12/26/2019, CT head 11/23/2019 CT chest, abdomen and pelvis 11/22/2019 FINDINGS: CT HEAD FINDINGS Brain: Extensive vasogenic edema throughout the right frontal and parietal lobes, minimally increased from recent MRI and CT. 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 There is an overlying air and fluid containing collection superficial to the craniotomy bone flap as well as additional mixed attenuation air and fluid extra-axial collection extending across the vertex and along the left falx. Hyperdensity along the adjacent sulci could reflect reactive dural thickening or hemorrhage. Some associated resulting mass effect along the right cerebral hemisphere with slight global sulcal effacement with features suggesting more diffuse volume loss elsewhere in the brain. No significant midline shift or transtentorial migration. Basal cisterns are patent. Vascular:  Atherosclerotic calcification of the carotid siphons. No hyperdense vessel. Skull: Postsurgical changes at the vertex from recent craniotomy as above with overlying and subjacent swelling and hematoma. Additional site of left frontal scalp swelling likely related to the acute trauma is noted with a small crescentic scalp hematoma measuring up to 5 mm in maximal thickness. No subjacent calvarial fracture. Other scalp hematoma. Chronic deformities of the nasal bones are similar to prior. Sinuses/Orbits: Paranasal sinuses and mastoid air cells are predominantly clear. No retro septal gas, stranding or hemorrhage. Included orbital structures are otherwise unremarkable. Other: None CT CERVICAL SPINE FINDINGS Alignment: Stabilization collar in place. Straightening of the normal cervical lordosis. Mild rightward cranial rotation. No  evidence of traumatic listhesis. No abnormally widened, perched or jumped facets. Normal alignment of the craniocervical and atlantoaxial articulations accounting for degree of cranial rotation. Skull base and vertebrae: No visible skull base fracture. No vertebral body fracture or height loss. Posterior elements are intact. Multilevel spondylitic changes, uncinate spurring and facet degenerative change, detailed below. No acute or worrisome osseous lesion. Soft tissues and spinal canal: No pre or paravertebral fluid or swelling. No visible canal hematoma. Disc levels: Multilevel intervertebral disc height loss with cervical spondylitic changes maximal C5-C7. Posterior disc osteophyte complexes are present resulting at most maximal mild canal stenosis. Multilevel uncinate spurring and facet hypertrophic changes result in mild-to-moderate multilevel neural foraminal narrowing as well. No severe canal stenosis or foraminal impingement. Other: Cervical carotid atherosclerosis. CT CHEST FINDINGS Cardiovascular: The aortic root is suboptimally assessed given cardiac pulsation artifact. Calcifications of the aortic leaflets. Borderline dilated ascending thoracic aorta at 4 cm. Proximal dilatation of the descending thoracic aorta is similar to prior measuring up to 3.3 cm in diameter and 3.5 cm at the level of the diaphragm. No acute luminal abnormality. No periaortic stranding or hemorrhage. Normal 3 vessel branching of the aortic arch. Minimal plaque in the proximal great vessels without other significant luminal abnormality or acute finding. Central pulmonary arteries are borderline enlarged. No large central or lobar filling defects on this non tailored examination of the pulmonary arteries. Normal heart size. Coronary artery calcifications are present. No pericardial effusion. Mediastinum/Nodes: No mediastinal fluid or gas. Normal thyroid gland and thoracic inlet. No acute abnormality of the trachea or esophagus. No  worrisome mediastinal, hilar or axillary adenopathy. Lungs/Pleura: No acute traumatic parenchymal injury. No pneumothorax or effusion. Dependent atelectatic changes. Stable multifocal areas of bandlike opacity in the mid to lower lungs compatible with areas of cicatricial or atelectatic change. No focal consolidation or features of edema. Mild bronchitic airways thickening. 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 No acute rib fractures. No acute osseous injury of the chest wall elsewhere nor in the thoracic spine or included shoulders. No large body wall hematoma or other acute traumatic injury of the soft tissues. CT ABDOMEN PELVIS FINDINGS Hepatobiliary: No direct hepatic injury or perihepatic hematoma. No worrisome focal liver abnormality is seen. Normal gallbladder. No visible calcified gallstones. No biliary ductal dilatation. Pancreas: No pancreatic contusive change or ductal disruption. No peripancreatic inflammation or discernible lesions. Spleen: Normal splenic size. Stable hypoattenuating cleft at the superior spleen. No direct splenic injury or perisplenic hematoma. Adrenals/Urinary Tract: Normal adrenal glands without evidence of hemorrhage. No direct renal injury or perinephric hemorrhage. Slight non rotation of the left kidney, benign anatomic variant. No concerning renal mass, urolithiasis or hydronephrosis. Urinary bladder is unremarkable. Stomach/Bowel: Distal esophagus, stomach and duodenum are free of acute abnormality. A normal appendix  is visualized. Moderate colonic stool burden without proximal thickening or distension. Scattered colonic diverticula without focal inflammation to suggest diverticulitis. Some mild rectal wall thickening with large inspissated stool ball measuring up to 7.9 cm in diameter with associated faint perirectal stranding. Vascular/Lymphatic: No acute vascular injury or contrast extravasation in the abdomen or pelvis.  Atherosclerotic calcifications within the abdominal aorta and branch vessels. No aneurysm or ectasia. No enlarged abdominopelvic lymph nodes. Reproductive: Anteverted uterus with calcified subserosal fibroid. No concerning adnexal lesions. Other: No large body wall hematoma or retroperitoneal hemorrhage. No traumatic abdominal wall dehiscence. No abdominopelvic free air or fluid. Perirectal stranding, as above. Musculoskeletal: Sclerotic bands along the medial ilia may ring the SI joint may reflect sequela of remote pelvic insufficiency fracture, stable from priors. No acute fracture or traumatic osseous injury of the lumbar spine, bony pelvis or proximal femora. Multilevel discogenic and facet degenerative changes in the lumbar spine, maximal L5-S1. Additional degenerative changes in the hips and pelvis. IMPRESSION: CT HEAD 1. Left frontal scalp contusion without subjacent calvarial fracture. 2. Postsurgical changes at the vertex from recent craniotomy at the site of meningioma resection with residual hyperdense tumor along the resection margin adjacent the superior sagittal sinus. There is an overlying air and fluid containing collection superficial to the craniotomy bone flap as well as additional mixed attenuation extra-axial fluid extending across the vertex and left falx. 3. Mild sulcal effacement throughout the cerebral hemisphere without significant midline shift. 4. Slight interval increase in the degree of vasogenic edema within the right frontal and parietal lobes. 5. No other sites of hyperdense hemorrhage or other acute intracranial abnormality. 6. Background mild parenchymal volume loss and microvascular angiopathy. CT CERVICAL SPINE 1. No acute fracture or traumatic osseous injury of the cervical spine. 2. Multilevel cervical spondylitic changes as detailed above. 3. Cervical carotid atherosclerosis. CT CHEST, ABDOMEN AND PELVIS 1. No acute traumatic injury to the chest. 2. No acute traumatic injury  to the abdomen or pelvis. 3. Some mild rectal wall thickening with large inspissated stool ball measuring up to 7.9 cm in diameter with associated faint perirectal stranding. Correlate for signs and symptoms of stercoral colitis. 4. Colonic diverticulosis without evidence of acute diverticulitis. 5. Fibroid uterus. 6.  Emphysema (ICD10-J43.9).  Mild bronchitic changes as well. 7. Aortic Atherosclerosis (ICD10-I70.0). 8. 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 Recommend annual imaging followup by CTA or MRA. This recommendation follows 2010 ACCF/AHA/AATS/ACR/ASA/SCA/SCAI/SIR/STS/SVM Guidelines for the Diagnosis and Management of Patients with Thoracic Aortic Disease. Circulation. 2010; 121: W102-V253. Aortic aneurysm NOS (ICD10-I71.9) Critical Value/emergent results were called by telephone at the time of interpretation on 01/02/2020 at 2:47 am to provider Dr. Kieth Brightly , who verbally acknowledged these results. Electronically Signed   By: Lovena Le M.D.   On: 01/02/2020 02:47   CT ABDOMEN PELVIS W CONTRAST  Result Date: 01/02/2020 CLINICAL DATA:  Fall on blood thinners, left periorbital hematoma EXAM: CT HEAD WITHOUT CONTRAST CT CERVICAL SPINE WITHOUT CONTRAST CT CHEST, ABDOMEN AND PELVIS WITH CONTRAST TECHNIQUE: Contiguous axial images were obtained from the base of the skull through the vertex without intravenous contrast. Multidetector CT imaging of the cervical spine was performed without intravenous contrast. Multiplanar CT image reconstructions were also generated. Multidetector CT imaging of the chest, abdomen and pelvis was performed following the standard protocol during bolus administration of intravenous contrast. CONTRAST:  125mL OMNIPAQUE IOHEXOL 300 MG/ML  SOLN COMPARISON:  Numerous priors, most recently head 12/26/2019, CT head 11/23/2019 CT chest, abdomen and pelvis 11/22/2019  FINDINGS: CT HEAD FINDINGS Brain: Extensive vasogenic edema throughout the right frontal and parietal  lobes, minimally increased from recent MRI and CT. 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 There is an overlying air and fluid containing collection superficial to the craniotomy bone flap as well as additional mixed attenuation air and fluid extra-axial collection extending across the vertex and along the left falx. Hyperdensity along the adjacent sulci could reflect reactive dural thickening or hemorrhage. Some associated resulting mass effect along the right cerebral hemisphere with slight global sulcal effacement with features suggesting more diffuse volume loss elsewhere in the brain. No significant midline shift or transtentorial migration. Basal cisterns are patent. Vascular: Atherosclerotic calcification of the carotid siphons. No hyperdense vessel. Skull: Postsurgical changes at the vertex from recent craniotomy as above with overlying and subjacent swelling and hematoma. Additional site of left frontal scalp swelling likely related to the acute trauma is noted with a small crescentic scalp hematoma measuring up to 5 mm in maximal thickness. No subjacent calvarial fracture. Other scalp hematoma. Chronic deformities of the nasal bones are similar to prior. Sinuses/Orbits: Paranasal sinuses and mastoid air cells are predominantly clear. No retro septal gas, stranding or hemorrhage. Included orbital structures are otherwise unremarkable. Other: None CT CERVICAL SPINE FINDINGS Alignment: Stabilization collar in place. Straightening of the normal cervical lordosis. Mild rightward cranial rotation. No evidence of traumatic listhesis. No abnormally widened, perched or jumped facets. Normal alignment of the craniocervical and atlantoaxial articulations accounting for degree of cranial rotation. Skull base and vertebrae: No visible skull base fracture. No vertebral body fracture or height loss.  Posterior elements are intact. Multilevel spondylitic changes, uncinate spurring and facet degenerative change, detailed below. No acute or worrisome osseous lesion. Soft tissues and spinal canal: No pre or paravertebral fluid or swelling. No visible canal hematoma. Disc levels: Multilevel intervertebral disc height loss with cervical spondylitic changes maximal C5-C7. Posterior disc osteophyte complexes are present resulting at most maximal mild canal stenosis. Multilevel uncinate spurring and facet hypertrophic changes result in mild-to-moderate multilevel neural foraminal narrowing as well. No severe canal stenosis or foraminal impingement. Other: Cervical carotid atherosclerosis. CT CHEST FINDINGS Cardiovascular: The aortic root is suboptimally assessed given cardiac pulsation artifact. Calcifications of the aortic leaflets. Borderline dilated ascending thoracic aorta at 4 cm. Proximal dilatation of the descending thoracic aorta is similar to prior measuring up to 3.3 cm in diameter and 3.5 cm at the level of the diaphragm. No acute luminal abnormality. No periaortic stranding or hemorrhage. Normal 3 vessel branching of the aortic arch. Minimal plaque in the proximal great vessels without other significant luminal abnormality or acute finding. Central pulmonary arteries are borderline enlarged. No large central or lobar filling defects on this non tailored examination of the pulmonary arteries. Normal heart size. Coronary artery calcifications are present. No pericardial effusion. Mediastinum/Nodes: No mediastinal fluid or gas. Normal thyroid gland and thoracic inlet. No acute abnormality of the trachea or esophagus. No worrisome mediastinal, hilar or axillary adenopathy. Lungs/Pleura: No acute traumatic parenchymal injury. No pneumothorax or effusion. Dependent atelectatic changes. Stable multifocal areas of bandlike opacity in the mid to lower lungs compatible with areas of cicatricial or atelectatic change.  No focal consolidation or features of edema. Mild bronchitic airways thickening. 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 No acute rib fractures. No acute osseous injury of the chest wall elsewhere nor in the thoracic  spine or included shoulders. No large body wall hematoma or other acute traumatic injury of the soft tissues. CT ABDOMEN PELVIS FINDINGS Hepatobiliary: No direct hepatic injury or perihepatic hematoma. No worrisome focal liver abnormality is seen. Normal gallbladder. No visible calcified gallstones. No biliary ductal dilatation. Pancreas: No pancreatic contusive change or ductal disruption. No peripancreatic inflammation or discernible lesions. Spleen: Normal splenic size. Stable hypoattenuating cleft at the superior spleen. No direct splenic injury or perisplenic hematoma. Adrenals/Urinary Tract: Normal adrenal glands without evidence of hemorrhage. No direct renal injury or perinephric hemorrhage. Slight non rotation of the left kidney, benign anatomic variant. No concerning renal mass, urolithiasis or hydronephrosis. Urinary bladder is unremarkable. Stomach/Bowel: Distal esophagus, stomach and duodenum are free of acute abnormality. A normal appendix is visualized. Moderate colonic stool burden without proximal thickening or distension. Scattered colonic diverticula without focal inflammation to suggest diverticulitis. Some mild rectal wall thickening with large inspissated stool ball measuring up to 7.9 cm in diameter with associated faint perirectal stranding. Vascular/Lymphatic: No acute vascular injury or contrast extravasation in the abdomen or pelvis. Atherosclerotic calcifications within the abdominal aorta and branch vessels. No aneurysm or ectasia. No enlarged abdominopelvic lymph nodes. Reproductive: Anteverted uterus with calcified subserosal fibroid. No concerning adnexal lesions. Other: No large body wall hematoma or retroperitoneal  hemorrhage. No traumatic abdominal wall dehiscence. No abdominopelvic free air or fluid. Perirectal stranding, as above. Musculoskeletal: Sclerotic bands along the medial ilia may ring the SI joint may reflect sequela of remote pelvic insufficiency fracture, stable from priors. No acute fracture or traumatic osseous injury of the lumbar spine, bony pelvis or proximal femora. Multilevel discogenic and facet degenerative changes in the lumbar spine, maximal L5-S1. Additional degenerative changes in the hips and pelvis. IMPRESSION: CT HEAD 1. Left frontal scalp contusion without subjacent calvarial fracture. 2. Postsurgical changes at the vertex from recent craniotomy at the site of meningioma resection with residual hyperdense tumor along the resection margin adjacent the superior sagittal sinus. There is an overlying air and fluid containing collection superficial to the craniotomy bone flap as well as additional mixed attenuation extra-axial fluid extending across the vertex and left falx. 3. Mild sulcal effacement throughout the cerebral hemisphere without significant midline shift. 4. Slight interval increase in the degree of vasogenic edema within the right frontal and parietal lobes. 5. No other sites of hyperdense hemorrhage or other acute intracranial abnormality. 6. Background mild parenchymal volume loss and microvascular angiopathy. CT CERVICAL SPINE 1. No acute fracture or traumatic osseous injury of the cervical spine. 2. Multilevel cervical spondylitic changes as detailed above. 3. Cervical carotid atherosclerosis. CT CHEST, ABDOMEN AND PELVIS 1. No acute traumatic injury to the chest. 2. No acute traumatic injury to the abdomen or pelvis. 3. Some mild rectal wall thickening with large inspissated stool ball measuring up to 7.9 cm in diameter with associated faint perirectal stranding. Correlate for signs and symptoms of stercoral colitis. 4. Colonic diverticulosis without evidence of acute  diverticulitis. 5. Fibroid uterus. 6.  Emphysema (ICD10-J43.9).  Mild bronchitic changes as well. 7. Aortic Atherosclerosis (ICD10-I70.0). 8. 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 Recommend annual imaging followup by CTA or MRA. This recommendation follows 2010 ACCF/AHA/AATS/ACR/ASA/SCA/SCAI/SIR/STS/SVM Guidelines for the Diagnosis and Management of Patients with Thoracic Aortic Disease. Circulation. 2010; 121: J191-Y782. Aortic aneurysm NOS (ICD10-I71.9) Critical Value/emergent results were called by telephone at the time of interpretation on 01/02/2020 at 2:47 am to provider Dr. Kieth Brightly , who verbally acknowledged these results. Electronically Signed   By: March Rummage  [facility] M.D.   On: 01/02/2020 02:47   DG Pelvis Portable  Result Date: 01/02/2020 CLINICAL DATA:  Level 1 trauma, fall blood thinners, forehead hematoma, hypotensive EXAM: PORTABLE PELVIS 1-2 VIEWS COMPARISON:  Radiograph 11/18/2019 FINDINGS: The bones of the pelvis appear intact and congruent. Evaluation the sacrum is limited due to overlying bowel gas. No clear disruption of the sacral arcs is seen. Proximal femora are normally located and intact as well. Additional degenerative changes noted throughout the lower lumbar spine, hips and pelvis. Telemetry leads overlie the pelvis. IMPRESSION: 1. No acute osseous abnormality. 2. Mild to moderate degenerative changes in the hips and pelvis. Electronically Signed   By: Lovena Le M.D.   On: 01/02/2020 01:51   DG Chest Port 1 View  Result Date: 01/02/2020 CLINICAL DATA:  Initial evaluation for acute trauma, fall. EXAM: PORTABLE CHEST 1 VIEW COMPARISON:  Prior radiograph from 11/21/2019. FINDINGS: Cardiomegaly, stable.  Mediastinal silhouette within normal limits. Lungs mildly hypoinflated. Streaky opacity involving the mid left lung again noted, stable from previous, likely reflecting post treatment changes. No other focal airspace disease. No edema or effusion. No pneumothorax. No  acute osseous abnormality. Remote fracture deformity of the left posterior 6 rib noted, stable. IMPRESSION: Stable appearance of the chest. No active cardiopulmonary disease identified. Electronically Signed   By: Jeannine Boga M.D.   On: 01/02/2020 01:51    Procedures Procedures (including critical care time)  Medications Ordered in ED Medications  vancomycin (VANCOCIN) IVPB 1000 mg/200 mL premix (has no administration in time range)  ceFEPIme (MAXIPIME) 2 g in sodium chloride 0.9 % 100 mL IVPB (has no administration in time range)  metroNIDAZOLE (FLAGYL) IVPB 500 mg (has no administration in time range)  0.9 %  sodium chloride infusion (has no administration in time range)  iohexol (OMNIPAQUE) 300 MG/ML solution 100 mL (100 mLs Intravenous Contrast Given 01/02/20 0151)    ED Course  I have reviewed the triage vital signs and the nursing notes.  Pertinent labs & imaging results that were available during my care of the patient were reviewed by me and considered in my medical decision making (see chart for details).    MDM Reviewed: previous chart, nursing note and vitals Interpretation: x-ray, ECG, labs and CT scan (potassium is hemolyzed normal creatinine normal cxr and pelvis XRAY by me CT of head with air and collection and vasogenic edema by me ) Total time providing critical care: 75-105 minutes (multiple antibiotics and reversal of anticoagulation ). This excludes time spent performing separately reportable procedures and services. Consults: admitting MD, trauma and neurosurgery (case d/w Dr. Marcello Moores, NPO reverse eliquis antibiotics, cultures please admit to medicine )  CRITICAL CARE Performed by: Ramie Palladino K Alphonso Gregson-Rasch Total critical care time: 75 minutes Critical care time was exclusive of separately billable procedures and treating other patients. Critical care was necessary to treat or prevent imminent or life-threatening deterioration. Critical care was time spent  personally by me on the following activities: development of treatment plan with patient and/or surrogate as well as nursing, discussions with consultants, evaluation of patient's response to treatment, examination of patient, obtaining history from patient or surrogate, ordering and performing treatments and interventions, ordering and review of laboratory studies, ordering and review of radiographic studies, pulse oximetry and re-evaluation of patient's condition.  Final Clinical Impression(s) / ED Diagnoses Final diagnoses:  Fall  Fall   Admit to medicine.  Keep NPO, reverse eliquis for surgery.  Gentle hydration.     Keith Cancio, MD 01/02/20  0324  

## 2020-01-02 NOTE — H&P (Addendum)
History and Physical    [name] NIO:[id number] DOB: 1945/09/07 DOA: 01/02/2020  PCP: Nolene Ebbs, MD  Patient coming from: [place].  Chief Complaint: Fall.  HPI: [name] is a 74 y.o. 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 [name] oncologist.  Patient states she was on the wheelchair yesterday when she was trying to pick something she lost control and fell over and hit her head.  Did not lose consciousness.  Was brought to the ER.  ED Course: In the ER patient had CT head C-spine chest abdomen pelvis which shows scalp contusion and also postsurgical changes at the site where patient had the surgery for the brain mass.  There was concern for possible abscess at the site and on-call neurosurgeon Dr. Venetia Constable was consulted blood cultures obtained and started on empiric antibiotics.  Trauma surgery also was consulted.  Labs show acute renal failure with creatinine of 1.6 creatinine was normal during last month.  Potassium initially was 7.8 but repeat was 3.6 CBC was unremarkable.  Patient admitted for possible intracranial abscess for which empiric antibiotics have been started neurosurgery has been consulted and since patient is on apixaban reversal of the apixaban was requested by neurosurgery and pharmacy has started.  Review of Systems: As per HPI, rest all negative.   Past Medical History:  Diagnosis Date  . Cancer ([place])   . COPD (chronic obstructive pulmonary disease) ([place])   . HIV (human immunodeficiency virus infection) ([place])   . Renal disorder   . Stroke [facility])     Past Surgical History:  Procedure Laterality Date    . BRAIN SURGERY    . CRANIOTOMY       reports that she has never smoked. She has never used smokeless tobacco. She reports previous alcohol use. She reports previous drug use.  No Known Allergies  Family History  Family history unknown: Yes    Prior to Admission medications   Not on File    Physical Exam: Constitutional: Moderately built and nourished. Vitals:   01/02/20 0316 01/02/20 0340 01/02/20 0350 01/02/20 0400  BP: 93/64 103/75 102/69 105/65  Pulse: 64 (!) 121 79 76  Resp: 16 14 19 16   Temp:      TempSrc:      SpO2: 100% 98% 94% 94%  Weight:      Height:       Eyes: Anicteric no pallor. ENMT: No discharge from the ears eyes nose or mouth. Neck: No mass felt.  No neck rigidity. Respiratory: No rhonchi or crepitations. Cardiovascular: S1-S2 heard. Abdomen: Soft nontender bowel sounds present. Musculoskeletal: No edema. Skin: No rash. Neurologic: Alert awake oriented time place and person.  Left-sided hemiplegia unable to move her left upper extremity and states this has been chronic.  Left lower extremity is 4 x 5. Psychiatric: Appears normal.   Labs on Admission: I have personally reviewed following labs and imaging studies  CBC: Recent Labs  Lab 01/02/20 0157 01/02/20 0207  WBC 9.0  --   HGB 12.9 13.9  HCT 41.5 41.0  MCV 88.1  --   PLT 238  --    Basic Metabolic Panel:  Recent Labs  Lab 01/02/20 0207 01/02/20 0239  NA 131*  --   K 7.8* 3.6  CL 99  --   GLUCOSE 107*  --   BUN 69*  --   CREATININE 1.60*  --    GFR: Estimated Creatinine Clearance: 38.1 mL/min (A) (by C-G formula based on SCr of 1.6 mg/dL (H)). Liver Function Tests: No results for input(s): AST, ALT, ALKPHOS, BILITOT, PROT, ALBUMIN in the last 168 hours. No results for input(s): LIPASE, AMYLASE in the last 168 hours. No results for input(s): AMMONIA in the last 168 hours. Coagulation Profile: Recent Labs  Lab 01/02/20 0157  INR 1.2   Cardiac Enzymes: No results for  input(s): CKTOTAL, CKMB, CKMBINDEX, TROPONINI in the last 168 hours. BNP (last 3 results) No results for input(s): PROBNP in the last 8760 hours. HbA1C: No results for input(s): HGBA1C in the last 72 hours. CBG: No results for input(s): GLUCAP in the last 168 hours. Lipid Profile: No results for input(s): CHOL, HDL, LDLCALC, TRIG, CHOLHDL, LDLDIRECT in the last 72 hours. Thyroid Function Tests: No results for input(s): TSH, T4TOTAL, FREET4, T3FREE, THYROIDAB in the last 72 hours. Anemia Panel: No results for input(s): VITAMINB12, FOLATE, FERRITIN, TIBC, IRON, RETICCTPCT in the last 72 hours. Urine analysis: No results found for: COLORURINE, APPEARANCEUR, LABSPEC, PHURINE, GLUCOSEU, HGBUR, BILIRUBINUR, KETONESUR, PROTEINUR, UROBILINOGEN, NITRITE, LEUKOCYTESUR Sepsis Labs: @LABRCNTIP (procalcitonin:4,lacticidven:4) ) Recent Results (from the past 240 hour(s))  SARS Coronavirus 2 by RT PCR (hospital order, performed in [facility] hospital lab) Nasopharyngeal Nasopharyngeal Swab     Status: None   Collection Time: 01/02/20  2:39 AM   Specimen: Nasopharyngeal Swab  Result Value Ref Range Status   SARS Coronavirus 2 NEGATIVE NEGATIVE Final    Comment: (NOTE) SARS-CoV-2 target nucleic acids are NOT DETECTED.  The SARS-CoV-2 RNA is generally detectable in upper and lower respiratory specimens during the acute phase of infection. The lowest concentration of SARS-CoV-2 viral copies this assay can detect is 250 copies / mL. A negative result does not preclude SARS-CoV-2 infection and should not be used as the sole basis for treatment or other patient management decisions.  A negative result may occur with improper specimen collection / handling, submission of specimen other than nasopharyngeal swab, presence of viral mutation(s) within the areas targeted by this assay, and inadequate number of viral copies (<250 copies / mL). A negative result must be combined with clinical observations,  patient history, and epidemiological information.  Fact Sheet for Patients:   StrictlyIdeas.no  Fact Sheet for Healthcare Providers: BankingDealers.co.za  This test is not yet approved or  cleared by the [place] FDA and has been authorized for detection and/or diagnosis of SARS-CoV-2 by FDA under an Emergency Use Authorization (EUA).  This EUA will remain in effect (meaning this test can be used) for the duration of the COVID-19 declaration under Section 564(b)(1) of the Act, 21 U.S.C. section 360bbb-3(b)(1), unless the authorization is terminated or revoked sooner.  Performed at [facility], [place] [address] [place], [place] [address]      Radiological Exams on Admission: CT HEAD WO CONTRAST  Result Date: 01/02/2020 CLINICAL DATA:  Fall on blood thinners, left periorbital hematoma EXAM: CT HEAD WITHOUT CONTRAST CT CERVICAL SPINE WITHOUT CONTRAST CT CHEST, ABDOMEN AND PELVIS WITH CONTRAST TECHNIQUE: Contiguous axial images were obtained from the base of the skull through the vertex without intravenous contrast. Multidetector CT imaging of the cervical spine was performed without intravenous contrast. Multiplanar CT image reconstructions  were also generated. Multidetector CT imaging of the chest, abdomen and pelvis was performed following the standard protocol during bolus administration of intravenous contrast. CONTRAST:  191mL OMNIPAQUE IOHEXOL 300 MG/ML  SOLN COMPARISON:  Numerous priors, most recently head 12/26/2019, CT head 11/23/2019 CT chest, abdomen and pelvis 11/22/2019 FINDINGS: CT HEAD FINDINGS Brain: Extensive vasogenic edema throughout the right frontal and parietal lobes, minimally increased from recent MRI and CT. 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 There is an overlying air and fluid containing  collection superficial to the craniotomy bone flap as well as additional mixed attenuation air and fluid extra-axial collection extending across the vertex and along the left falx. Hyperdensity along the adjacent sulci could reflect reactive dural thickening or hemorrhage. Some associated resulting mass effect along the right cerebral hemisphere with slight global sulcal effacement with features suggesting more diffuse volume loss elsewhere in the brain. No significant midline shift or transtentorial migration. Basal cisterns are patent. Vascular: Atherosclerotic calcification of the carotid siphons. No hyperdense vessel. Skull: Postsurgical changes at the vertex from recent craniotomy as above with overlying and subjacent swelling and hematoma. Additional site of left frontal scalp swelling likely related to the acute trauma is noted with a small crescentic scalp hematoma measuring up to 5 mm in maximal thickness. No subjacent calvarial fracture. Other scalp hematoma. Chronic deformities of the nasal bones are similar to prior. Sinuses/Orbits: Paranasal sinuses and mastoid air cells are predominantly clear. No retro septal gas, stranding or hemorrhage. Included orbital structures are otherwise unremarkable. Other: None CT CERVICAL SPINE FINDINGS Alignment: Stabilization collar in place. Straightening of the normal cervical lordosis. Mild rightward cranial rotation. No evidence of traumatic listhesis. No abnormally widened, perched or jumped facets. Normal alignment of the craniocervical and atlantoaxial articulations accounting for degree of cranial rotation. Skull base and vertebrae: No visible skull base fracture. No vertebral body fracture or height loss. Posterior elements are intact. Multilevel spondylitic changes, uncinate spurring and facet degenerative change, detailed below. No acute or worrisome osseous lesion. Soft tissues and spinal canal: No pre or paravertebral fluid or swelling. No visible canal  hematoma. Disc levels: Multilevel intervertebral disc height loss with cervical spondylitic changes maximal C5-C7. Posterior disc osteophyte complexes are present resulting at most maximal mild canal stenosis. Multilevel uncinate spurring and facet hypertrophic changes result in mild-to-moderate multilevel neural foraminal narrowing as well. No severe canal stenosis or foraminal impingement. Other: Cervical carotid atherosclerosis. CT CHEST FINDINGS Cardiovascular: The aortic root is suboptimally assessed given cardiac pulsation artifact. Calcifications of the aortic leaflets. Borderline dilated ascending thoracic aorta at 4 cm. Proximal dilatation of the descending thoracic aorta is similar to prior measuring up to 3.3 cm in diameter and 3.5 cm at the level of the diaphragm. No acute luminal abnormality. No periaortic stranding or hemorrhage. Normal 3 vessel branching of the aortic arch. Minimal plaque in the proximal great vessels without other significant luminal abnormality or acute finding. Central pulmonary arteries are borderline enlarged. No large central or lobar filling defects on this non tailored examination of the pulmonary arteries. Normal heart size. Coronary artery calcifications are present. No pericardial effusion. Mediastinum/Nodes: No mediastinal fluid or gas. Normal thyroid gland and thoracic inlet. No acute abnormality of the trachea or esophagus. No worrisome mediastinal, hilar or axillary adenopathy. Lungs/Pleura: No acute traumatic parenchymal injury. No pneumothorax or effusion. Dependent atelectatic changes. Stable multifocal areas of bandlike opacity in the mid to lower  lungs compatible with areas of cicatricial or atelectatic change. No focal consolidation or features of edema. Mild bronchitic airways thickening. 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 No acute rib fractures. No acute osseous injury of the chest wall elsewhere nor in the  thoracic spine or included shoulders. No large body wall hematoma or other acute traumatic injury of the soft tissues. CT ABDOMEN PELVIS FINDINGS Hepatobiliary: No direct hepatic injury or perihepatic hematoma. No worrisome focal liver abnormality is seen. Normal gallbladder. No visible calcified gallstones. No biliary ductal dilatation. Pancreas: No pancreatic contusive change or ductal disruption. No peripancreatic inflammation or discernible lesions. Spleen: Normal splenic size. Stable hypoattenuating cleft at the superior spleen. No direct splenic injury or perisplenic hematoma. Adrenals/Urinary Tract: Normal adrenal glands without evidence of hemorrhage. No direct renal injury or perinephric hemorrhage. Slight non rotation of the left kidney, benign anatomic variant. No concerning renal mass, urolithiasis or hydronephrosis. Urinary bladder is unremarkable. Stomach/Bowel: Distal esophagus, stomach and duodenum are free of acute abnormality. A normal appendix is visualized. Moderate colonic stool burden without proximal thickening or distension. Scattered colonic diverticula without focal inflammation to suggest diverticulitis. Some mild rectal wall thickening with large inspissated stool ball measuring up to 7.9 cm in diameter with associated faint perirectal stranding. Vascular/Lymphatic: No acute vascular injury or contrast extravasation in the abdomen or pelvis. Atherosclerotic calcifications within the abdominal aorta and branch vessels. No aneurysm or ectasia. No enlarged abdominopelvic lymph nodes. Reproductive: Anteverted uterus with calcified subserosal fibroid. No concerning adnexal lesions. Other: No large body wall hematoma or retroperitoneal hemorrhage. No traumatic abdominal wall dehiscence. No abdominopelvic free air or fluid. Perirectal stranding, as above. Musculoskeletal: Sclerotic bands along the medial ilia may ring the SI joint may reflect sequela of remote pelvic insufficiency fracture,  stable from priors. No acute fracture or traumatic osseous injury of the lumbar spine, bony pelvis or proximal femora. Multilevel discogenic and facet degenerative changes in the lumbar spine, maximal L5-S1. Additional degenerative changes in the hips and pelvis. IMPRESSION: CT HEAD 1. Left frontal scalp contusion without subjacent calvarial fracture. 2. Postsurgical changes at the vertex from recent craniotomy at the site of meningioma resection with residual hyperdense tumor along the resection margin adjacent the superior sagittal sinus. There is an overlying air and fluid containing collection superficial to the craniotomy bone flap as well as additional mixed attenuation extra-axial fluid extending across the vertex and left falx. 3. Mild sulcal effacement throughout the cerebral hemisphere without significant midline shift. 4. Slight interval increase in the degree of vasogenic edema within the right frontal and parietal lobes. 5. No other sites of hyperdense hemorrhage or other acute intracranial abnormality. 6. Background mild parenchymal volume loss and microvascular angiopathy. CT CERVICAL SPINE 1. No acute fracture or traumatic osseous injury of the cervical spine. 2. Multilevel cervical spondylitic changes as detailed above. 3. Cervical carotid atherosclerosis. CT CHEST, ABDOMEN AND PELVIS 1. No acute traumatic injury to the chest. 2. No acute traumatic injury to the abdomen or pelvis. 3. Some mild rectal wall thickening with large inspissated stool ball measuring up to 7.9 cm in diameter with associated faint perirectal stranding. Correlate for signs and symptoms of stercoral colitis. 4. Colonic diverticulosis without evidence of acute diverticulitis. 5. Fibroid uterus. 6.  Emphysema (ICD10-J43.9).  Mild bronchitic changes as well. 7. Aortic Atherosclerosis (ICD10-I70.0). 8. 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 Recommend annual imaging followup by CTA or MRA. This recommendation  follows 2010 ACCF/AHA/AATS/ACR/ASA/SCA/SCAI/SIR/STS/SVM Guidelines for the  Diagnosis and Management of Patients with Thoracic Aortic Disease. Circulation. 2010; 121: Z610-R604. Aortic aneurysm NOS (ICD10-I71.9) Critical Value/emergent results were called by telephone at the time of interpretation on 01/02/2020 at 2:47 am to provider Dr. Kieth Brightly , who verbally acknowledged these results. Electronically Signed   By: Lovena Le M.D.   On: 01/02/2020 02:47   CT CHEST W CONTRAST  Result Date: 01/02/2020 CLINICAL DATA:  Fall on blood thinners, left periorbital hematoma EXAM: CT HEAD WITHOUT CONTRAST CT CERVICAL SPINE WITHOUT CONTRAST CT CHEST, ABDOMEN AND PELVIS WITH CONTRAST TECHNIQUE: Contiguous axial images were obtained from the base of the skull through the vertex without intravenous contrast. Multidetector CT imaging of the cervical spine was performed without intravenous contrast. Multiplanar CT image reconstructions were also generated. Multidetector CT imaging of the chest, abdomen and pelvis was performed following the standard protocol during bolus administration of intravenous contrast. CONTRAST:  168mL OMNIPAQUE IOHEXOL 300 MG/ML  SOLN COMPARISON:  Numerous priors, most recently head 12/26/2019, CT head 11/23/2019 CT chest, abdomen and pelvis 11/22/2019 FINDINGS: CT HEAD FINDINGS Brain: Extensive vasogenic edema throughout the right frontal and parietal lobes, minimally increased from recent MRI and CT. 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 There is an overlying air and fluid containing collection superficial to the craniotomy bone flap as well as additional mixed attenuation air and fluid extra-axial collection extending across the vertex and along the left falx. Hyperdensity along the adjacent sulci could reflect reactive dural thickening or hemorrhage. Some associated resulting mass  effect along the right cerebral hemisphere with slight global sulcal effacement with features suggesting more diffuse volume loss elsewhere in the brain. No significant midline shift or transtentorial migration. Basal cisterns are patent. Vascular: Atherosclerotic calcification of the carotid siphons. No hyperdense vessel. Skull: Postsurgical changes at the vertex from recent craniotomy as above with overlying and subjacent swelling and hematoma. Additional site of left frontal scalp swelling likely related to the acute trauma is noted with a small crescentic scalp hematoma measuring up to 5 mm in maximal thickness. No subjacent calvarial fracture. Other scalp hematoma. Chronic deformities of the nasal bones are similar to prior. Sinuses/Orbits: Paranasal sinuses and mastoid air cells are predominantly clear. No retro septal gas, stranding or hemorrhage. Included orbital structures are otherwise unremarkable. Other: None CT CERVICAL SPINE FINDINGS Alignment: Stabilization collar in place. Straightening of the normal cervical lordosis. Mild rightward cranial rotation. No evidence of traumatic listhesis. No abnormally widened, perched or jumped facets. Normal alignment of the craniocervical and atlantoaxial articulations accounting for degree of cranial rotation. Skull base and vertebrae: No visible skull base fracture. No vertebral body fracture or height loss. Posterior elements are intact. Multilevel spondylitic changes, uncinate spurring and facet degenerative change, detailed below. No acute or worrisome osseous lesion. Soft tissues and spinal canal: No pre or paravertebral fluid or swelling. No visible canal hematoma. Disc levels: Multilevel intervertebral disc height loss with cervical spondylitic changes maximal C5-C7. Posterior disc osteophyte complexes are present resulting at most maximal mild canal stenosis. Multilevel uncinate spurring and facet hypertrophic changes result in mild-to-moderate multilevel  neural foraminal narrowing as well. No severe canal stenosis or foraminal impingement. Other: Cervical carotid atherosclerosis. CT CHEST FINDINGS Cardiovascular: The aortic root is suboptimally assessed given cardiac pulsation artifact. Calcifications of the aortic leaflets. Borderline dilated ascending thoracic aorta at 4 cm. Proximal dilatation of the descending thoracic aorta is similar to prior measuring up to  3.3 cm in diameter and 3.5 cm at the level of the diaphragm. No acute luminal abnormality. No periaortic stranding or hemorrhage. Normal 3 vessel branching of the aortic arch. Minimal plaque in the proximal great vessels without other significant luminal abnormality or acute finding. Central pulmonary arteries are borderline enlarged. No large central or lobar filling defects on this non tailored examination of the pulmonary arteries. Normal heart size. Coronary artery calcifications are present. No pericardial effusion. Mediastinum/Nodes: No mediastinal fluid or gas. Normal thyroid gland and thoracic inlet. No acute abnormality of the trachea or esophagus. No worrisome mediastinal, hilar or axillary adenopathy. Lungs/Pleura: No acute traumatic parenchymal injury. No pneumothorax or effusion. Dependent atelectatic changes. Stable multifocal areas of bandlike opacity in the mid to lower lungs compatible with areas of cicatricial or atelectatic change. No focal consolidation or features of edema. Mild bronchitic airways thickening. 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 No acute rib fractures. No acute osseous injury of the chest wall elsewhere nor in the thoracic spine or included shoulders. No large body wall hematoma or other acute traumatic injury of the soft tissues. CT ABDOMEN PELVIS FINDINGS Hepatobiliary: No direct hepatic injury or perihepatic hematoma. No worrisome focal liver abnormality is seen. Normal gallbladder. No visible calcified gallstones.  No biliary ductal dilatation. Pancreas: No pancreatic contusive change or ductal disruption. No peripancreatic inflammation or discernible lesions. Spleen: Normal splenic size. Stable hypoattenuating cleft at the superior spleen. No direct splenic injury or perisplenic hematoma. Adrenals/Urinary Tract: Normal adrenal glands without evidence of hemorrhage. No direct renal injury or perinephric hemorrhage. Slight non rotation of the left kidney, benign anatomic variant. No concerning renal mass, urolithiasis or hydronephrosis. Urinary bladder is unremarkable. Stomach/Bowel: Distal esophagus, stomach and duodenum are free of acute abnormality. A normal appendix is visualized. Moderate colonic stool burden without proximal thickening or distension. Scattered colonic diverticula without focal inflammation to suggest diverticulitis. Some mild rectal wall thickening with large inspissated stool ball measuring up to 7.9 cm in diameter with associated faint perirectal stranding. Vascular/Lymphatic: No acute vascular injury or contrast extravasation in the abdomen or pelvis. Atherosclerotic calcifications within the abdominal aorta and branch vessels. No aneurysm or ectasia. No enlarged abdominopelvic lymph nodes. Reproductive: Anteverted uterus with calcified subserosal fibroid. No concerning adnexal lesions. Other: No large body wall hematoma or retroperitoneal hemorrhage. No traumatic abdominal wall dehiscence. No abdominopelvic free air or fluid. Perirectal stranding, as above. Musculoskeletal: Sclerotic bands along the medial ilia may ring the SI joint may reflect sequela of remote pelvic insufficiency fracture, stable from priors. No acute fracture or traumatic osseous injury of the lumbar spine, bony pelvis or proximal femora. Multilevel discogenic and facet degenerative changes in the lumbar spine, maximal L5-S1. Additional degenerative changes in the hips and pelvis. IMPRESSION: CT HEAD 1. Left frontal scalp  contusion without subjacent calvarial fracture. 2. Postsurgical changes at the vertex from recent craniotomy at the site of meningioma resection with residual hyperdense tumor along the resection margin adjacent the superior sagittal sinus. There is an overlying air and fluid containing collection superficial to the craniotomy bone flap as well as additional mixed attenuation extra-axial fluid extending across the vertex and left falx. 3. Mild sulcal effacement throughout the cerebral hemisphere without significant midline shift. 4. Slight interval increase in the degree of vasogenic edema within the right frontal and parietal lobes. 5. No other sites of hyperdense hemorrhage or other acute intracranial abnormality. 6. Background mild parenchymal volume loss and microvascular angiopathy. CT CERVICAL SPINE  1. No acute fracture or traumatic osseous injury of the cervical spine. 2. Multilevel cervical spondylitic changes as detailed above. 3. Cervical carotid atherosclerosis. CT CHEST, ABDOMEN AND PELVIS 1. No acute traumatic injury to the chest. 2. No acute traumatic injury to the abdomen or pelvis. 3. Some mild rectal wall thickening with large inspissated stool ball measuring up to 7.9 cm in diameter with associated faint perirectal stranding. Correlate for signs and symptoms of stercoral colitis. 4. Colonic diverticulosis without evidence of acute diverticulitis. 5. Fibroid uterus. 6.  Emphysema (ICD10-J43.9).  Mild bronchitic changes as well. 7. Aortic Atherosclerosis (ICD10-I70.0). 8. 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 Recommend annual imaging followup by CTA or MRA. This recommendation follows 2010 ACCF/AHA/AATS/ACR/ASA/SCA/SCAI/SIR/STS/SVM Guidelines for the Diagnosis and Management of Patients with Thoracic Aortic Disease. Circulation. 2010; 121: H962-I297. Aortic aneurysm NOS (ICD10-I71.9) Critical Value/emergent results were called by telephone at the time of interpretation on  01/02/2020 at 2:47 am to provider Dr. Kieth Brightly , who verbally acknowledged these results. Electronically Signed   By: Lovena Le M.D.   On: 01/02/2020 02:47   CT CERVICAL SPINE WO CONTRAST  Result Date: 01/02/2020 CLINICAL DATA:  Fall on blood thinners, left periorbital hematoma EXAM: CT HEAD WITHOUT CONTRAST CT CERVICAL SPINE WITHOUT CONTRAST CT CHEST, ABDOMEN AND PELVIS WITH CONTRAST TECHNIQUE: Contiguous axial images were obtained from the base of the skull through the vertex without intravenous contrast. Multidetector CT imaging of the cervical spine was performed without intravenous contrast. Multiplanar CT image reconstructions were also generated. Multidetector CT imaging of the chest, abdomen and pelvis was performed following the standard protocol during bolus administration of intravenous contrast. CONTRAST:  127mL OMNIPAQUE IOHEXOL 300 MG/ML  SOLN COMPARISON:  Numerous priors, most recently head 12/26/2019, CT head 11/23/2019 CT chest, abdomen and pelvis 11/22/2019 FINDINGS: CT HEAD FINDINGS Brain: Extensive vasogenic edema throughout the right frontal and parietal lobes, minimally increased from recent MRI and CT. 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 There is an overlying air and fluid containing collection superficial to the craniotomy bone flap as well as additional mixed attenuation air and fluid extra-axial collection extending across the vertex and along the left falx. Hyperdensity along the adjacent sulci could reflect reactive dural thickening or hemorrhage. Some associated resulting mass effect along the right cerebral hemisphere with slight global sulcal effacement with features suggesting more diffuse volume loss elsewhere in the brain. No significant midline shift or transtentorial migration. Basal cisterns are patent. Vascular: Atherosclerotic calcification of the  carotid siphons. No hyperdense vessel. Skull: Postsurgical changes at the vertex from recent craniotomy as above with overlying and subjacent swelling and hematoma. Additional site of left frontal scalp swelling likely related to the acute trauma is noted with a small crescentic scalp hematoma measuring up to 5 mm in maximal thickness. No subjacent calvarial fracture. Other scalp hematoma. Chronic deformities of the nasal bones are similar to prior. Sinuses/Orbits: Paranasal sinuses and mastoid air cells are predominantly clear. No retro septal gas, stranding or hemorrhage. Included orbital structures are otherwise unremarkable. Other: None CT CERVICAL SPINE FINDINGS Alignment: Stabilization collar in place. Straightening of the normal cervical lordosis. Mild rightward cranial rotation. No evidence of traumatic listhesis. No abnormally widened, perched or jumped facets. Normal alignment of the craniocervical and atlantoaxial articulations accounting for degree of cranial rotation. Skull base and vertebrae: No visible skull base fracture. No vertebral body fracture or height loss. Posterior elements  are intact. Multilevel spondylitic changes, uncinate spurring and facet degenerative change, detailed below. No acute or worrisome osseous lesion. Soft tissues and spinal canal: No pre or paravertebral fluid or swelling. No visible canal hematoma. Disc levels: Multilevel intervertebral disc height loss with cervical spondylitic changes maximal C5-C7. Posterior disc osteophyte complexes are present resulting at most maximal mild canal stenosis. Multilevel uncinate spurring and facet hypertrophic changes result in mild-to-moderate multilevel neural foraminal narrowing as well. No severe canal stenosis or foraminal impingement. Other: Cervical carotid atherosclerosis. CT CHEST FINDINGS Cardiovascular: The aortic root is suboptimally assessed given cardiac pulsation artifact. Calcifications of the aortic leaflets. Borderline  dilated ascending thoracic aorta at 4 cm. Proximal dilatation of the descending thoracic aorta is similar to prior measuring up to 3.3 cm in diameter and 3.5 cm at the level of the diaphragm. No acute luminal abnormality. No periaortic stranding or hemorrhage. Normal 3 vessel branching of the aortic arch. Minimal plaque in the proximal great vessels without other significant luminal abnormality or acute finding. Central pulmonary arteries are borderline enlarged. No large central or lobar filling defects on this non tailored examination of the pulmonary arteries. Normal heart size. Coronary artery calcifications are present. No pericardial effusion. Mediastinum/Nodes: No mediastinal fluid or gas. Normal thyroid gland and thoracic inlet. No acute abnormality of the trachea or esophagus. No worrisome mediastinal, hilar or axillary adenopathy. Lungs/Pleura: No acute traumatic parenchymal injury. No pneumothorax or effusion. Dependent atelectatic changes. Stable multifocal areas of bandlike opacity in the mid to lower lungs compatible with areas of cicatricial or atelectatic change. No focal consolidation or features of edema. Mild bronchitic airways thickening. 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 No acute rib fractures. No acute osseous injury of the chest wall elsewhere nor in the thoracic spine or included shoulders. No large body wall hematoma or other acute traumatic injury of the soft tissues. CT ABDOMEN PELVIS FINDINGS Hepatobiliary: No direct hepatic injury or perihepatic hematoma. No worrisome focal liver abnormality is seen. Normal gallbladder. No visible calcified gallstones. No biliary ductal dilatation. Pancreas: No pancreatic contusive change or ductal disruption. No peripancreatic inflammation or discernible lesions. Spleen: Normal splenic size. Stable hypoattenuating cleft at the superior spleen. No direct splenic injury or perisplenic hematoma.  Adrenals/Urinary Tract: Normal adrenal glands without evidence of hemorrhage. No direct renal injury or perinephric hemorrhage. Slight non rotation of the left kidney, benign anatomic variant. No concerning renal mass, urolithiasis or hydronephrosis. Urinary bladder is unremarkable. Stomach/Bowel: Distal esophagus, stomach and duodenum are free of acute abnormality. A normal appendix is visualized. Moderate colonic stool burden without proximal thickening or distension. Scattered colonic diverticula without focal inflammation to suggest diverticulitis. Some mild rectal wall thickening with large inspissated stool ball measuring up to 7.9 cm in diameter with associated faint perirectal stranding. Vascular/Lymphatic: No acute vascular injury or contrast extravasation in the abdomen or pelvis. Atherosclerotic calcifications within the abdominal aorta and branch vessels. No aneurysm or ectasia. No enlarged abdominopelvic lymph nodes. Reproductive: Anteverted uterus with calcified subserosal fibroid. No concerning adnexal lesions. Other: No large body wall hematoma or retroperitoneal hemorrhage. No traumatic abdominal wall dehiscence. No abdominopelvic free air or fluid. Perirectal stranding, as above. Musculoskeletal: Sclerotic bands along the medial ilia may ring the SI joint may reflect sequela of remote pelvic insufficiency fracture, stable from priors. No acute fracture or traumatic osseous injury of the lumbar spine, bony pelvis or proximal femora. Multilevel discogenic and facet degenerative changes in the lumbar spine, maximal L5-S1. Additional degenerative  changes in the hips and pelvis. IMPRESSION: CT HEAD 1. Left frontal scalp contusion without subjacent calvarial fracture. 2. Postsurgical changes at the vertex from recent craniotomy at the site of meningioma resection with residual hyperdense tumor along the resection margin adjacent the superior sagittal sinus. There is an overlying air and fluid containing  collection superficial to the craniotomy bone flap as well as additional mixed attenuation extra-axial fluid extending across the vertex and left falx. 3. Mild sulcal effacement throughout the cerebral hemisphere without significant midline shift. 4. Slight interval increase in the degree of vasogenic edema within the right frontal and parietal lobes. 5. No other sites of hyperdense hemorrhage or other acute intracranial abnormality. 6. Background mild parenchymal volume loss and microvascular angiopathy. CT CERVICAL SPINE 1. No acute fracture or traumatic osseous injury of the cervical spine. 2. Multilevel cervical spondylitic changes as detailed above. 3. Cervical carotid atherosclerosis. CT CHEST, ABDOMEN AND PELVIS 1. No acute traumatic injury to the chest. 2. No acute traumatic injury to the abdomen or pelvis. 3. Some mild rectal wall thickening with large inspissated stool ball measuring up to 7.9 cm in diameter with associated faint perirectal stranding. Correlate for signs and symptoms of stercoral colitis. 4. Colonic diverticulosis without evidence of acute diverticulitis. 5. Fibroid uterus. 6.  Emphysema (ICD10-J43.9).  Mild bronchitic changes as well. 7. Aortic Atherosclerosis (ICD10-I70.0). 8. 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 Recommend annual imaging followup by CTA or MRA. This recommendation follows 2010 ACCF/AHA/AATS/ACR/ASA/SCA/SCAI/SIR/STS/SVM Guidelines for the Diagnosis and Management of Patients with Thoracic Aortic Disease. Circulation. 2010; 121: O756-E332. Aortic aneurysm NOS (ICD10-I71.9) Critical Value/emergent results were called by telephone at the time of interpretation on 01/02/2020 at 2:47 am to provider Dr. Kieth Brightly , who verbally acknowledged these results. Electronically Signed   By: Lovena Le M.D.   On: 01/02/2020 02:47   CT ABDOMEN PELVIS W CONTRAST  Result Date: 01/02/2020 CLINICAL DATA:  Fall on blood thinners, left periorbital hematoma EXAM: CT  HEAD WITHOUT CONTRAST CT CERVICAL SPINE WITHOUT CONTRAST CT CHEST, ABDOMEN AND PELVIS WITH CONTRAST TECHNIQUE: Contiguous axial images were obtained from the base of the skull through the vertex without intravenous contrast. Multidetector CT imaging of the cervical spine was performed without intravenous contrast. Multiplanar CT image reconstructions were also generated. Multidetector CT imaging of the chest, abdomen and pelvis was performed following the standard protocol during bolus administration of intravenous contrast. CONTRAST:  135mL OMNIPAQUE IOHEXOL 300 MG/ML  SOLN COMPARISON:  Numerous priors, most recently head 12/26/2019, CT head 11/23/2019 CT chest, abdomen and pelvis 11/22/2019 FINDINGS: CT HEAD FINDINGS Brain: Extensive vasogenic edema throughout the right frontal and parietal lobes, minimally increased from recent MRI and CT. 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 There is an overlying air and fluid containing collection superficial to the craniotomy bone flap as well as additional mixed attenuation air and fluid extra-axial collection extending across the vertex and along the left falx. Hyperdensity along the adjacent sulci could reflect reactive dural thickening or hemorrhage. Some associated resulting mass effect along the right cerebral hemisphere with slight global sulcal effacement with features suggesting more diffuse volume loss elsewhere in the brain. No significant midline shift or transtentorial migration. Basal cisterns are patent. Vascular: Atherosclerotic calcification of the carotid siphons. No hyperdense vessel. Skull: Postsurgical changes at the vertex from recent craniotomy as above with overlying and subjacent swelling and hematoma. Additional site of left frontal  scalp swelling likely related to the acute trauma is noted with a small crescentic scalp hematoma  measuring up to 5 mm in maximal thickness. No subjacent calvarial fracture. Other scalp hematoma. Chronic deformities of the nasal bones are similar to prior. Sinuses/Orbits: Paranasal sinuses and mastoid air cells are predominantly clear. No retro septal gas, stranding or hemorrhage. Included orbital structures are otherwise unremarkable. Other: None CT CERVICAL SPINE FINDINGS Alignment: Stabilization collar in place. Straightening of the normal cervical lordosis. Mild rightward cranial rotation. No evidence of traumatic listhesis. No abnormally widened, perched or jumped facets. Normal alignment of the craniocervical and atlantoaxial articulations accounting for degree of cranial rotation. Skull base and vertebrae: No visible skull base fracture. No vertebral body fracture or height loss. Posterior elements are intact. Multilevel spondylitic changes, uncinate spurring and facet degenerative change, detailed below. No acute or worrisome osseous lesion. Soft tissues and spinal canal: No pre or paravertebral fluid or swelling. No visible canal hematoma. Disc levels: Multilevel intervertebral disc height loss with cervical spondylitic changes maximal C5-C7. Posterior disc osteophyte complexes are present resulting at most maximal mild canal stenosis. Multilevel uncinate spurring and facet hypertrophic changes result in mild-to-moderate multilevel neural foraminal narrowing as well. No severe canal stenosis or foraminal impingement. Other: Cervical carotid atherosclerosis. CT CHEST FINDINGS Cardiovascular: The aortic root is suboptimally assessed given cardiac pulsation artifact. Calcifications of the aortic leaflets. Borderline dilated ascending thoracic aorta at 4 cm. Proximal dilatation of the descending thoracic aorta is similar to prior measuring up to 3.3 cm in diameter and 3.5 cm at the level of the diaphragm. No acute luminal abnormality. No periaortic stranding or hemorrhage. Normal 3 vessel branching of the  aortic arch. Minimal plaque in the proximal great vessels without other significant luminal abnormality or acute finding. Central pulmonary arteries are borderline enlarged. No large central or lobar filling defects on this non tailored examination of the pulmonary arteries. Normal heart size. Coronary artery calcifications are present. No pericardial effusion. Mediastinum/Nodes: No mediastinal fluid or gas. Normal thyroid gland and thoracic inlet. No acute abnormality of the trachea or esophagus. No worrisome mediastinal, hilar or axillary adenopathy. Lungs/Pleura: No acute traumatic parenchymal injury. No pneumothorax or effusion. Dependent atelectatic changes. Stable multifocal areas of bandlike opacity in the mid to lower lungs compatible with areas of cicatricial or atelectatic change. No focal consolidation or features of edema. Mild bronchitic airways thickening. 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 No acute rib fractures. No acute osseous injury of the chest wall elsewhere nor in the thoracic spine or included shoulders. No large body wall hematoma or other acute traumatic injury of the soft tissues. CT ABDOMEN PELVIS FINDINGS Hepatobiliary: No direct hepatic injury or perihepatic hematoma. No worrisome focal liver abnormality is seen. Normal gallbladder. No visible calcified gallstones. No biliary ductal dilatation. Pancreas: No pancreatic contusive change or ductal disruption. No peripancreatic inflammation or discernible lesions. Spleen: Normal splenic size. Stable hypoattenuating cleft at the superior spleen. No direct splenic injury or perisplenic hematoma. Adrenals/Urinary Tract: Normal adrenal glands without evidence of hemorrhage. No direct renal injury or perinephric hemorrhage. Slight non rotation of the left kidney, benign anatomic variant. No concerning renal mass, urolithiasis or hydronephrosis. Urinary bladder is unremarkable. Stomach/Bowel:  Distal esophagus, stomach and duodenum are free of acute abnormality. A normal appendix is visualized. Moderate colonic stool burden without proximal thickening or distension. Scattered colonic diverticula without focal inflammation to suggest diverticulitis. Some mild rectal wall thickening with large inspissated stool ball measuring  up to 7.9 cm in diameter with associated faint perirectal stranding. Vascular/Lymphatic: No acute vascular injury or contrast extravasation in the abdomen or pelvis. Atherosclerotic calcifications within the abdominal aorta and branch vessels. No aneurysm or ectasia. No enlarged abdominopelvic lymph nodes. Reproductive: Anteverted uterus with calcified subserosal fibroid. No concerning adnexal lesions. Other: No large body wall hematoma or retroperitoneal hemorrhage. No traumatic abdominal wall dehiscence. No abdominopelvic free air or fluid. Perirectal stranding, as above. Musculoskeletal: Sclerotic bands along the medial ilia may ring the SI joint may reflect sequela of remote pelvic insufficiency fracture, stable from priors. No acute fracture or traumatic osseous injury of the lumbar spine, bony pelvis or proximal femora. Multilevel discogenic and facet degenerative changes in the lumbar spine, maximal L5-S1. Additional degenerative changes in the hips and pelvis. IMPRESSION: CT HEAD 1. Left frontal scalp contusion without subjacent calvarial fracture. 2. Postsurgical changes at the vertex from recent craniotomy at the site of meningioma resection with residual hyperdense tumor along the resection margin adjacent the superior sagittal sinus. There is an overlying air and fluid containing collection superficial to the craniotomy bone flap as well as additional mixed attenuation extra-axial fluid extending across the vertex and left falx. 3. Mild sulcal effacement throughout the cerebral hemisphere without significant midline shift. 4. Slight interval increase in the degree of  vasogenic edema within the right frontal and parietal lobes. 5. No other sites of hyperdense hemorrhage or other acute intracranial abnormality. 6. Background mild parenchymal volume loss and microvascular angiopathy. CT CERVICAL SPINE 1. No acute fracture or traumatic osseous injury of the cervical spine. 2. Multilevel cervical spondylitic changes as detailed above. 3. Cervical carotid atherosclerosis. CT CHEST, ABDOMEN AND PELVIS 1. No acute traumatic injury to the chest. 2. No acute traumatic injury to the abdomen or pelvis. 3. Some mild rectal wall thickening with large inspissated stool ball measuring up to 7.9 cm in diameter with associated faint perirectal stranding. Correlate for signs and symptoms of stercoral colitis. 4. Colonic diverticulosis without evidence of acute diverticulitis. 5. Fibroid uterus. 6.  Emphysema (ICD10-J43.9).  Mild bronchitic changes as well. 7. Aortic Atherosclerosis (ICD10-I70.0). 8. 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 Recommend annual imaging followup by CTA or MRA. This recommendation follows 2010 ACCF/AHA/AATS/ACR/ASA/SCA/SCAI/SIR/STS/SVM Guidelines for the Diagnosis and Management of Patients with Thoracic Aortic Disease. Circulation. 2010; 121: J188-C166. Aortic aneurysm NOS (ICD10-I71.9) Critical Value/emergent results were called by telephone at the time of interpretation on 01/02/2020 at 2:47 am to provider Dr. Kieth Brightly , who verbally acknowledged these results. Electronically Signed   By: Lovena Le M.D.   On: 01/02/2020 02:47   DG Pelvis Portable  Result Date: 01/02/2020 CLINICAL DATA:  Level 1 trauma, fall blood thinners, forehead hematoma, hypotensive EXAM: PORTABLE PELVIS 1-2 VIEWS COMPARISON:  Radiograph 11/18/2019 FINDINGS: The bones of the pelvis appear intact and congruent. Evaluation the sacrum is limited due to overlying bowel gas. No clear disruption of the sacral arcs is seen. Proximal femora are normally located and intact as well.  Additional degenerative changes noted throughout the lower lumbar spine, hips and pelvis. Telemetry leads overlie the pelvis. IMPRESSION: 1. No acute osseous abnormality. 2. Mild to moderate degenerative changes in the hips and pelvis. Electronically Signed   By: Lovena Le M.D.   On: 01/02/2020 01:51   DG Chest Port 1 View  Result Date: 01/02/2020 CLINICAL DATA:  Initial evaluation for acute trauma, fall. EXAM: PORTABLE CHEST 1 VIEW COMPARISON:  Prior radiograph from 11/21/2019. FINDINGS: Cardiomegaly, stable.  Mediastinal silhouette  within normal limits. Lungs mildly hypoinflated. Streaky opacity involving the mid left lung again noted, stable from previous, likely reflecting post treatment changes. No other focal airspace disease. No edema or effusion. No pneumothorax. No acute osseous abnormality. Remote fracture deformity of the left posterior 6 rib noted, stable. IMPRESSION: Stable appearance of the chest. No active cardiopulmonary disease identified. Electronically Signed   By: Jeannine Boga M.D.   On: 01/02/2020 01:51    EKG: Independently reviewed.  Normal sinus rhythm with ST-T changes compatible with pericarditis and when I looked into the previous EKGs ST-T's are elevated in the inferolateral leads like this EKG.  Assessment/Plan Principal Problem:   Pneumocephalus Active Problems:   HIV (human immunodeficiency virus infection) (HCC)   Brain mass   OSA (obstructive sleep apnea)    1. Possible brain abscess for which empiric antibiotics have been started and neurosurgery has been consulted.  Patient is kept n.p.o. in anticipation of possible procedure and also patient's apixaban which patient takes for history of pulmonary embolism has been reversed per pharmacy protocol. 2. 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 [name] oncologist. 3. Acute renal failure could be from dehydration  receiving fluids. 4. History of COPD not actively wheezing. 5. History of embolic CVA with left-sided hemiplegia.  Patient is unable to move her left upper extremity which patient states is chronic.  Left lower extremity patient is able to move around 3 x 4.  Patient n.p.o.  Patient takes apixaban which is being reversed for possible procedure for the intracranial possible abscess. 6. History of pulmonary embolism on apixaban which is on hold and apixaban is getting reversed as explained earlier for possible intracranial abscess which may need procedure.  Patient is aware of the reversal. 7. History of HIV presently n.p.o.  Last CD4 count was 714 in June 2021.  Takes [employer]. 8. History of sleep apnea.  Given that patient may require intracranial procedure for possible brain abscess patient will need close monitoring for any further worsening in inpatient status.   DVT prophylaxis: SCDs in anticipation of possible procedure will hold off any anticoagulation. Code Status: Full code.   Family Communication: Discussed with patient. Disposition Plan: To be determined. Consults called: Neurosurgery and trauma surgery. Admission status: Inpatient.   Rise Patience MD [facility] Pager [phone].  If 7PM-7AM, please contact night-coverage [email or web address] Password [email or web address]  01/02/2020, 6:02 AM

## 2020-01-02 NOTE — Progress Notes (Signed)
Chaplain responded to level 1 fall-patient on blood thinners. Patient was not available. At [facility].  No family, friends present. Chaplain informed ED bridge to call if needed.  Rev. Tamsen Snider Pager [phone]

## 2020-01-02 NOTE — Progress Notes (Signed)
Pharmacy Antibiotic Note  [name] is a 74 y.o. female admitted on 01/02/2020 with possible intracranial abscess.  Pharmacy has been consulted for vancomycin & cefepime dosing. WBC WNL. Scr improved to 0.96.  Plan: Change vancomycin to 1250mg  IV Q12h Change cefepime to 2g IV Q8h  F/u cultures, renal function, clinical status, & vanc levels as needed.  Height: 5\' 9"  (175.3 cm) Weight: 90.7 kg (200 lb) IBW/kg (Calculated) : 66.2  Temp (24hrs), Avg:96.4 F (35.8 C), Min:96.4 F (35.8 C), Max:96.4 F (35.8 C)  Recent Labs  Lab 01/02/20 0157 01/02/20 0207 01/02/20 0820  WBC 9.0  --  9.1  CREATININE  --  1.60* 0.96  LATICACIDVEN 1.1  --   --     Estimated Creatinine Clearance: 63.6 mL/min (by C-G formula based on SCr of 0.96 mg/dL).    No Known Allergies  Antimicrobials this admission: Vancomycin 7/6 >>  Cefepime 7/6 >>  Metronidazole 7/6>>  Microbiology results: 7/6 BCx: pending 7/6 UCx: pending   Thank you for allowing pharmacy to be a part of this patient's care.  Beckey Rutter 01/02/2020 11:04 AM

## 2020-01-02 NOTE — ED Notes (Signed)
Pt taken to CT; delays due to IV access

## 2020-01-03 ENCOUNTER — Ambulatory Visit: Admission: RE | Admit: 2020-01-03 | Payer: Medicare Other | Source: Ambulatory Visit | Admitting: Radiation Oncology

## 2020-01-03 DIAGNOSIS — G4733 Obstructive sleep apnea (adult) (pediatric): Secondary | ICD-10-CM

## 2020-01-03 LAB — SAMPLE TO BLOOD BANK

## 2020-01-03 MED ORDER — SENNOSIDES-DOCUSATE SODIUM 8.6-50 MG PO TABS
1.0000 | ORAL_TABLET | Freq: Two times a day (BID) | ORAL | Status: DC
  Administered 2020-01-03 – 2020-01-06 (×7): 1 via ORAL
  Filled 2020-01-03 (×7): qty 1

## 2020-01-03 MED ORDER — BICTEGRAVIR-EMTRICITAB-TENOFOV 50-200-25 MG PO TABS
1.0000 | ORAL_TABLET | Freq: Every day | ORAL | Status: DC
  Administered 2020-01-03 – 2020-01-06 (×4): 1 via ORAL
  Filled 2020-01-03 (×4): qty 1

## 2020-01-03 MED ORDER — POLYETHYLENE GLYCOL 3350 17 G PO PACK
17.0000 g | PACK | Freq: Two times a day (BID) | ORAL | Status: DC
  Administered 2020-01-03 – 2020-01-06 (×6): 17 g via ORAL
  Filled 2020-01-03 (×6): qty 1

## 2020-01-03 MED ORDER — TRAMADOL HCL 50 MG PO TABS
50.0000 mg | ORAL_TABLET | Freq: Three times a day (TID) | ORAL | Status: DC | PRN
  Administered 2020-01-03 – 2020-01-06 (×6): 50 mg via ORAL
  Filled 2020-01-03 (×6): qty 1

## 2020-01-03 NOTE — Progress Notes (Addendum)
Called [facility], stated patient needs to be at the cancer center by 11:45.

## 2020-01-03 NOTE — Progress Notes (Signed)
PROGRESS NOTE  [name] FGH:[id number] DOB: 1945-11-14 DOA: 01/02/2020 PCP: Nolene Ebbs, MD  HPI/Recap of past 24 hours: HPI from Dr [name] [name] is a 74 y.o. 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 [name] oncologist.  Patient states she was on the wheelchair yesterday when she was trying to pick something she lost control and fell over and hit her head.  Did not lose consciousness. In the ER patient had CT head C-spine chest abdomen pelvis which shows scalp contusion and also postsurgical changes at the site where patient had the surgery for the brain mass.  There was concern for possible abscess at the site and on-call neurosurgeon Dr. Venetia Constable was consulted, blood cultures obtained and started on empiric antibiotics.  Trauma surgery also was consulted.  Labs show acute renal failure with creatinine of 1.6 creatinine was normal during last month.  Potassium initially was 7.8 but repeat was 3.6 CBC was unremarkable.  Patient admitted for possible intracranial abscess for which empiric antibiotics have been started neurosurgery has been consulted and since patient is on apixaban reversal of the apixaban was requested by neurosurgery.    Today, patient denies any new complaints.  Denies any worsening headache, nausea/vomiting, abdominal pain, chest pain, shortness of breath.   Assessment/Plan: Principal Problem:   Pneumocephalus Active Problems:   HIV (human immunodeficiency virus infection) (HCC)   Brain mass   OSA (obstructive sleep apnea)   Head trauma status post mechanical fall Brain abscess ruled out Currently afebrile, with no  leukocytosis BC x2 NGTD Currently no concern for possible brain abscess, MRI was negative for any developing or evolving abscess, no hematoma noted Neurosurgery on board, no further intervention Plan to DC IV antibiotics and monitor closely  History of COPD Stable Continue inhalers, nebs as needed  History of embolic CVA with left-sided hemiplegia Stable Eliquis held due to head trauma, plan to restart and 48 hours, likely on 01/04/2020  History of PE Stable Eliquis held as above  HIV Last CD4 count was 714 in June 2021 Continue Biktarvy  Brain mass positive for carcinoma Previous history of lung cancer Follows with Dr. [name] oncologist Ongoing radiation therapy        Malnutrition Type:      Malnutrition Characteristics:      Nutrition Interventions:       Estimated body mass index is 29.53 kg/m as calculated from the following:   Height as of this encounter: 5\' 9"  (1.753 m).   Weight as of this encounter: 90.7 kg.     Code Status: Full  Family Communication: Discussed with patient  Disposition Plan: Status is: Inpatient  Remains inpatient appropriate because:Inpatient level of care appropriate due to severity of illness   Dispo: The patient is from: SNF              Anticipated d/c is to: SNF              Anticipated d/c date is: 1 day              Patient currently is not medically stable to d/c.   Consultants:  Neurosurgery  Procedures:  None  Antimicrobials:  None-currently discontinued  DVT prophylaxis: Held due to head trauma   Objective: Vitals:   01/03/20 0347 01/03/20 0819 01/03/20 0937 01/03/20 0940  BP: 101/62 114/65    Pulse: 70 64    Resp: 16 16    Temp: 98.4 F (36.9 C) 98.3 F (36.8 C)    TempSrc: Oral Oral    SpO2: 97% 97% 96% 97%  Weight:      Height:        Intake/Output Summary (Last 24 hours) at 01/03/2020 1404 Last data filed at 01/03/2020 1309 Gross per 24 hour  Intake --  Output 200 ml  Net  -200 ml   Filed Weights   01/02/20 0220  Weight: 90.7 kg    Exam:  General: NAD, left supraorbital swelling  Cardiovascular: S1, S2 present  Respiratory: CTAB  Abdomen: Soft, nontender, nondistended, bowel sounds present  Musculoskeletal: No bilateral pedal edema noted  Skin: Normal  Psychiatry: Normal mood  Neurology: Chronic left-sided hemiplegia   Data Reviewed: CBC: Recent Labs  Lab 01/02/20 0157 01/02/20 0207 01/02/20 0820  WBC 9.0  --  9.1  NEUTROABS  --   --  6.5  HGB 12.9 13.9 11.8*  HCT 41.5 41.0 38.6  MCV 88.1  --  88.9  PLT 238  --  270   Basic Metabolic Panel: Recent Labs  Lab 01/02/20 0207 01/02/20 0239 01/02/20 0820  NA 131*  --  134*  K 7.8* 3.6 3.6  CL 99  --  99  CO2  --   --  24  GLUCOSE 107*  --  99  BUN 69*  --  36*  CREATININE 1.60*  --  0.96  CALCIUM  --   --  9.1   GFR: Estimated Creatinine Clearance: 63.6 mL/min (by C-G formula based on SCr of 0.96 mg/dL). Liver Function Tests: Recent Labs  Lab 01/02/20 0820  AST 23  ALT 21  ALKPHOS 58  BILITOT 0.6  PROT 7.5  ALBUMIN 3.0*   No results for input(s): LIPASE, AMYLASE in the last 168 hours. No results for input(s): AMMONIA in the last 168 hours. Coagulation Profile: Recent Labs  Lab 01/02/20 0157  INR 1.2   Cardiac Enzymes: No results for input(s): CKTOTAL, CKMB, CKMBINDEX, TROPONINI in the last 168 hours. BNP (last 3 results) No results for input(s): PROBNP in the last 8760 hours. HbA1C: No results for input(s): HGBA1C in the last 72 hours. CBG: No results for input(s): GLUCAP in the last 168 hours. Lipid Profile: No results for input(s): CHOL, HDL, LDLCALC, TRIG, CHOLHDL, LDLDIRECT in the last 72 hours. Thyroid Function Tests: No results for input(s): TSH, T4TOTAL, FREET4, T3FREE, THYROIDAB in the last 72 hours. Anemia Panel: No results for input(s): VITAMINB12, FOLATE, FERRITIN, TIBC, IRON, RETICCTPCT in the last 72 hours. Urine analysis: No results  found for: COLORURINE, APPEARANCEUR, LABSPEC, PHURINE, GLUCOSEU, HGBUR, BILIRUBINUR, KETONESUR, PROTEINUR, UROBILINOGEN, NITRITE, LEUKOCYTESUR Sepsis Labs: @LABRCNTIP (procalcitonin:4,lacticidven:4)  ) Recent Results (from the past 240 hour(s))  SARS Coronavirus 2 by RT PCR (hospital order, performed in [facility] hospital lab) Nasopharyngeal Nasopharyngeal Swab     Status: None   Collection Time: 01/02/20  2:39 AM   Specimen: Nasopharyngeal Swab  Result Value Ref Range Status   SARS Coronavirus 2 NEGATIVE NEGATIVE Final    Comment: (NOTE) SARS-CoV-2 target nucleic acids are NOT DETECTED.  The SARS-CoV-2 RNA is generally detectable in upper and lower respiratory specimens during the acute phase of infection. The lowest concentration of SARS-CoV-2 viral copies  this assay can detect is 250 copies / mL. A negative result does not preclude SARS-CoV-2 infection and should not be used as the sole basis for treatment or other patient management decisions.  A negative result may occur with improper specimen collection / handling, submission of specimen other than nasopharyngeal swab, presence of viral mutation(s) within the areas targeted by this assay, and inadequate number of viral copies (<250 copies / mL). A negative result must be combined with clinical observations, patient history, and epidemiological information.  Fact Sheet for Patients:   StrictlyIdeas.no  Fact Sheet for Healthcare Providers: BankingDealers.co.za  This test is not yet approved or  cleared by the [place] FDA and has been authorized for detection and/or diagnosis of SARS-CoV-2 by FDA under an Emergency Use Authorization (EUA).  This EUA will remain in effect (meaning this test can be used) for the duration of the COVID-19 declaration under Section 564(b)(1) of the Act, 21 U.S.C. section 360bbb-3(b)(1), unless the authorization is terminated or revoked  sooner.  Performed at [facility], [place] [address] [place], [place] [address]   Blood culture (routine x 2)     Status: None (Preliminary result)   Collection Time: 01/02/20  2:40 AM   Specimen: BLOOD  Result Value Ref Range Status   Specimen Description BLOOD RIGHT ARM  Final   Special Requests   Final    BOTTLES DRAWN AEROBIC AND ANAEROBIC Blood Culture adequate volume   Culture   Final    NO GROWTH 1 DAY Performed at [facility], [place] [address], [place], [place] [address]    Report Status PENDING  Incomplete  Blood culture (routine x 2)     Status: None (Preliminary result)   Collection Time: 01/02/20  3:30 AM   Specimen: BLOOD  Result Value Ref Range Status   Specimen Description BLOOD LEFT HAND  Final   Special Requests   Final    BOTTLES DRAWN AEROBIC AND ANAEROBIC Blood Culture adequate volume   Culture   Final    NO GROWTH 1 DAY Performed at [facility], [place] [address], [place], [place] [address]    Report Status PENDING  Incomplete      Studies: MR Brain W and Wo Contrast  Result Date: 01/02/2020 CLINICAL DATA:  Recent tumor resection. Fall. Possible operative site infection. EXAM: MRI HEAD WITHOUT AND WITH CONTRAST TECHNIQUE: Multiplanar, multiecho pulse sequences of the brain and surrounding structures were obtained without and with intravenous contrast. CONTRAST:  40mL GADAVIST GADOBUTROL 1 MMOL/ML IV SOLN, COMPARISON:  12/26/2019 FINDINGS: Brain: Redemonstration of resection cavity in the superior right hemisphere. Contrast enhancement along the resection margin is unchanged to slightly decreased. There is persistent diffusion restriction of the contents of the resection cavity, unchanged compared to the prior exam. Area nodular, intermediate contrast enhancement at the anterior resection margin is unchanged, measuring 2.0 x 3.8 cm. There is increased hyperintense T2-weighted signal throughout the right frontal and parietal white matter. Vascular:  Normal flow voids. Skull and upper cervical spine: Recent right-sided craniotomy. Worsened edema of the scalp with increased subgaleal fluid. Sinuses/Orbits: Negative. Other: None. IMPRESSION: 1. Unchanged to slightly decreased contrast enhancement along the resection margin of the superior right hemisphere, with persistent diffusion restriction of the resection cavity contents. No change to suggest developing or evolving abscess. 2. Unchanged nodular, intermediate contrast enhancement at the anterior resection margin, consistent with recurrent tumor. 3. Increased edema throughout the right frontal and parietal white matter. 4. Worsened  scalp edema with increased subgaleal fluid, most likely secondary to recent trauma. Electronically Signed   By: Ulyses Jarred M.D.   On: 01/02/2020 20:10    Scheduled Meds:  bictegravir-emtricitabine-tenofovir AF  1 tablet Oral Daily   DULoxetine  20 mg Oral Daily   gabapentin  800 mg Oral TID   hydrOXYzine  25 mg Oral Q6H   loratadine  10 mg Oral Daily   mometasone-formoterol  2 puff Inhalation BID   montelukast  10 mg Oral QHS   QUEtiapine Fumarate  150 mg Oral QHS   rOPINIRole  1 mg Oral QHS   umeclidinium bromide  1 puff Inhalation Daily   venlafaxine XR  150 mg Oral Daily    Continuous Infusions:  sodium chloride 100 mL/hr at 01/03/20 0845   ceFEPime (MAXIPIME) IV 2 g (01/03/20 0524)   metronidazole     metronidazole 500 mg (01/03/20 0929)   vancomycin 1,250 mg (01/03/20 0020)     LOS: 1 day     Alma Friendly, MD [facility]  If 7PM-7AM, please contact night-coverage [email or web address] 01/03/2020, 2:04 PM

## 2020-01-03 NOTE — Progress Notes (Signed)
Spoke with patient's nurse Jerene Pitch to let her know that the patient is scheduled for a radiation treatment today at 12:00 pm.  I asked her to call [facility] to have the patient here at 11:45.  I asked that the patient have pain medication before leaving her floor.  She verbalized understanding.  [facility]'s phone number given.  Will continue to follow as necessary.  Call back number for this writer [phone].  Gloriajean Dell. Leonie Green, BSN

## 2020-01-04 ENCOUNTER — Ambulatory Visit: Payer: Medicare Other | Admitting: Radiation Oncology

## 2020-01-04 LAB — BASIC METABOLIC PANEL
Anion gap: 8 (ref 5–15)
BUN: 19 mg/dL (ref 8–23)
CO2: 24 mmol/L (ref 22–32)
Calcium: 8.9 mg/dL (ref 8.9–10.3)
Chloride: 105 mmol/L (ref 98–111)
Creatinine, Ser: 0.74 mg/dL (ref 0.44–1.00)
GFR calc Af Amer: >60 mL/min (ref >60)
GFR calc non Af Amer: >60 mL/min (ref >60)
Glucose, Bld: 97 mg/dL (ref 70–99)
Potassium: 3.8 mmol/L (ref 3.5–5.1)
Sodium: 137 mmol/L (ref 135–145)

## 2020-01-04 LAB — POCT I-STAT, CHEM 8
BUN: 69 mg/dL — ABNORMAL HIGH (ref 8–23)
Calcium, Ion: 0.98 mmol/L — ABNORMAL LOW (ref 1.15–1.40)
Chloride: 99 mmol/L (ref 98–111)
Creatinine, Ser: 1.6 mg/dL — ABNORMAL HIGH (ref 0.44–1.00)
Glucose, Bld: 107 mg/dL — ABNORMAL HIGH (ref 70–99)
HCT: 41 % (ref 36.0–46.0)
Hemoglobin: 13.9 g/dL (ref 12.0–15.0)
Potassium: 7.8 mmol/L (ref 3.5–5.1)
Sodium: 131 mmol/L — ABNORMAL LOW (ref 135–145)
TCO2: 30 mmol/L (ref 22–32)

## 2020-01-04 LAB — CBC WITH DIFFERENTIAL/PLATELET
Abs Immature Granulocytes: 0.05 10*3/uL (ref 0.00–0.07)
Basophils Absolute: 0 10*3/uL (ref 0.0–0.1)
Basophils Relative: 0 %
Eosinophils Absolute: 0.1 10*3/uL (ref 0.0–0.5)
Eosinophils Relative: 2 %
HCT: 38.4 % (ref 36.0–46.0)
Hemoglobin: 11.8 g/dL — ABNORMAL LOW (ref 12.0–15.0)
Immature Granulocytes: 1 %
Lymphocytes Relative: 25 %
Lymphs Abs: 1.7 10*3/uL (ref 0.7–4.0)
MCH: 27.6 pg (ref 26.0–34.0)
MCHC: 30.7 g/dL (ref 30.0–36.0)
MCV: 89.7 fL (ref 80.0–100.0)
Monocytes Absolute: 0.6 10*3/uL (ref 0.1–1.0)
Monocytes Relative: 8 %
Neutro Abs: 4.6 10*3/uL (ref 1.7–7.7)
Neutrophils Relative %: 64 %
Platelets: 236 10*3/uL (ref 150–400)
RBC: 4.28 MIL/uL (ref 3.87–5.11)
RDW: 14.3 % (ref 11.5–15.5)
WBC: 7.1 10*3/uL (ref 4.0–10.5)
nRBC: 0 % (ref 0.0–0.2)

## 2020-01-04 MED ORDER — APIXABAN 5 MG PO TABS
5.0000 mg | ORAL_TABLET | Freq: Two times a day (BID) | ORAL | Status: DC
  Administered 2020-01-04 – 2020-01-06 (×4): 5 mg via ORAL
  Filled 2020-01-04 (×4): qty 1

## 2020-01-04 MED ORDER — DOXYCYCLINE HYCLATE 100 MG PO TABS
100.0000 mg | ORAL_TABLET | Freq: Two times a day (BID) | ORAL | Status: DC
  Administered 2020-01-04 – 2020-01-06 (×5): 100 mg via ORAL
  Filled 2020-01-04 (×5): qty 1

## 2020-01-04 NOTE — Consult Note (Signed)
WOC Nurse Consult Note: Patient receiving care in [facility] [phone]. Reason for Consult: scalp wound Wound type: a dehisced surgical incision, s/p fall with injury to area. Pressure Injury POA: Yes/No/NA Measurement: 0.3 cm x 2 cm x 0.4 cm Wound bed: red Drainage (amount, consistency, odor) drop or two of blood in cotton tipped applicator used to probe the depth Periwound: heavily crusted.  Hair is in need of shampooing.  Dressing procedure/placement/frequency:  Clean area on scalp with saline. Cut a narrow sliver of Aquacel Kellie Simmering [phone]). Using a cotton tipped applicator, gently insert the Aquacel into the wound. Leave a tail sticking out to ease removal.   I have also entered an order to shampoo hair if MD approves. Monitor the wound area(s) for worsening of condition such as: Signs/symptoms of infection,  Increase in size,  Development of or worsening of odor, Development of pain, or increased pain at the affected locations.  Notify the medical team if any of these develop.  Thank you for the consult.  Discussed plan of care with the patient and bedside nurse.  [facility] nurse will not follow at this time.  Please re-consult the [place] team if needed.  Val Riles, RN, MSN, CWOCN, CNS-BC, pager [phone]

## 2020-01-04 NOTE — Progress Notes (Addendum)
Occupational Therapy Evaluation  Clinical Impression: PTA pt reports living at SNF needing assist with bathing and dressing and using WC or rollator to ambulate. Pt was admitted for above and treated for problem list below (see OT Problem List). Pt is A&Ox4 but reports she has been hallucinating. Decreased safety awareness and needs verbal/tactile cues to follow one step commands with inconsistency with decreased initiation. Requires Min A - Total A +2 with verbal cues for sequencing and safety with ADLs due to L-sided hemiplegia, weakness, and deficits in balance and cognition. Requires min A-mod A with rolling in bed and Max A +2 for sidelying<>sitting EOB. Required Max A +2 - Total A +2 with stedy for sit<>stand with addition 3rd person for hip extension to lower flaps on stedy device. Attempted to transfer pt to [facility] but opted for pt to sit on the bedpan EOB as the [facility] was also not a safe option at this time. Believe pt would benefit from skilled OT services acutely and at the SNF level with 24/7 supervision/assist to increase safety and return to PLOF.       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 Presents to ED s/p fall from w/c with weakness. MRI negative for acute intracranial changes and negative for abscess however pt admitted for antibiotics for scalp incision infection.   Precautions  Precautions Fall  Precaution Comments Prior CVA with L siide hemiplegia  Home Living  Family/patient expects to be discharged to: Skilled nursing facility  Additional Comments needing assist with all ADLs and utilizing wc for mobility  Prior Function  Level of Independence  Needs assistance  Gait / Transfers Assistance Needed Pt reports she uses a wheelchair, however she also mentions sitting on a rollator and peddling herself around. Unsure if she has a true wheelchair or if she is using the rollator in this way and calling it a wheelchair.   ADL's / Homemaking Assistance Needed Pt reports needing assist with all ADLs  Communication  Communication No difficulties  Pain Assessment  Faces Pain Scale 4  Pain Location L shoulder, headache  Pain Descriptors / Indicators Headache;Aching  Cognition  Arousal/Alertness Awake/alert  Behavior During Therapy WFL for tasks assessed/performed  Overall Cognitive Status Impaired/Different from baseline  Area of Impairment Memory;Following commands;Safety/judgement;Awareness;Problem solving  Memory Decreased short-term memory  Following Commands Follows one step commands consistently;Follows multi-step commands inconsistently  Safety/Judgement Decreased awareness of safety  Awareness Intellectual  Problem Solving Slow processing;Decreased initiation;Requires verbal cues;Requires tactile cues;Difficulty sequencing  General Comments Pt is A&Ox4 but reports she has been hallucinating. Decreased safety awareness and needs verbal/tactile cues to follow one step commands - inconsistently with decreased initiation   Upper Extremity Assessment  LUE Deficits / Details Appears flaccid; painful; noted shoulder subluxation.   LUE Coordination decreased fine motor;decreased gross motor  Lower Extremity Assessment  RLE Deficits / Details Generalized weakness. Noted pt crossing ankles - initially R foot crossing over to the L in sitting, but in standing noted the L foot crossing over to the right.   LLE Deficits / Details Hemiplegic LLE consistent with prior CVA  Cervical / Trunk Assessment  Cervical / Trunk Assessment Other exceptions  Cervical / Trunk Exceptions head in forward position with rounded shoulders  ADL  Overall ADL's   Needs assistance/impaired  Eating/Feeding Minimal assistance;Bed level  Eating/Feeding Details (indicate cue type and reason) Pt reports needing assistance with eating reporting she was wearing her breakfast and lunch  Grooming Minimal assistance;Bed level;Cueing for sequencing  Grooming Details (indicate cue type and reason) Min A with verbal cues for sequencing  Upper Body Bathing Maximal assistance;Bed level;Cueing for sequencing  Upper Body Bathing Details (indicate cue type and reason) Max A due to hemiplegia  Lower Body Bathing Bed level;Total assistance  Lower Body Bathing Details (indicate cue type and reason) Total A due to L side hemiplegia  Upper Body Dressing  Maximal assistance;Bed level;Cueing for sequencing  Upper Body Dressing Details (indicate cue type and reason) Max A with verbal cues for sequencing due to L sided hemiplegia  Lower Body Dressing Total assistance;Bed level  Lower Body Dressing Details (indicate cue type and reason) Total A with LB dressing for donning socks  Toilet Transfer Maximal assistance;+2 for physical assistance;+2 for safety/equipment;BSC;Total assistance;Cueing for safety;Cueing for sequencing (with stedy)  Toilet Transfer Details (indicate cue type and reason) Max A +2-Total A+2 with use of steady to stand and get bed pan underneath pt.  Toileting- Clothing Manipulation and Hygiene Total assistance;+2 for safety/equipment;+2 for physical assistance;Sit to/from stand;Cueing for safety;Cueing for sequencing  Toileting - Clothing Manipulation Details (indicate cue type and reason) Total A +2 with standing balance  Tub/Shower Transfer Details (indicate cue type and reason) deferred due to pt safety  Functional mobility during ADLs Maximal assistance;Total assistance;+2 for physical assistance;+2 for safety/equipment;Cueing for sequencing;Cueing for safety (stedy)  General ADL Comments Min A - Total A+2 for ADLs due to L sided hemiplegia   Vision- History   Baseline Vision/History Wears glasses  Bed Mobility  Overal bed mobility Needs Assistance  Bed Mobility Rolling;Supine to Sit;Sit to Supine  Rolling Min assist;Mod assist  Supine to sit Max assist;+2 for physical assistance  Sit to supine Max assist;+2 for physical assistance  General bed mobility comments Min assist rolling L, mod assist rolling R. +2 assist for trunk elevation to full sitting position, and to elevate LE's back up to bed at end of session.   Transfers  Overall transfer level Needs assistance  Transfer via [place] to/from Stand  Sit to Stand Max assist;+2 physical assistance;From elevated surface  General transfer comment +2 assist for power-up to full stand with a third person helping to gain full hip extension to lower flaps of the Stedy. Assist required to facilitate weight shift to the R and maintain midline.  Balance  Overall balance assessment Needs assistance  Sitting-balance support Single extremity supported;Feet supported  Sitting balance-Leahy Scale Zero  Sitting balance - Comments Max A due to pt pushing to L with bouts of Min A with pt maintaining midline with posterior lean  Postural control Posterior lean;Left lateral lean  Standing balance support Single extremity supported  Standing balance-Leahy Scale Zero  Standing balance comment +2 required in standing with Max-Total A  Exercises  Exercises Other exercises  Other Exercises  Other Exercises R lateral lean onto elbow throughout session to correct for L pushing.   OT - End of Session  Equipment Utilized During Treatment Gait belt;Other (comment);Oxygen (stedy)  Activity Tolerance Patient tolerated treatment well  Patient left in bed;with call bell/phone within reach;with bed alarm set;Other (comment) (social work in room)  OT Assessment  OT Recommendation/Assessment Patient needs  continued OT Services  OT Visit Diagnosis Unsteadiness on feet (R26.81);Other  abnormalities of gait and mobility (R26.89);Muscle weakness (generalized) (M62.81);History of falling (Z91.81);Other symptoms and signs involving the nervous system (R29.898);Hemiplegia and hemiparesis;Pain  Hemiplegia - Right/Left Left  Hemiplegia - dominant/non-dominant Non-Dominant  Hemiplegia - caused by Cerebral infarction  Pain - Right/Left Left  Pain - part of body Shoulder (and headache)  OT Problem List Decreased strength;Decreased range of motion;Decreased activity tolerance;Impaired balance (sitting and/or standing);Decreased safety awareness;Pain;Impaired UE functional use;Decreased coordination;Decreased knowledge of use of DME or AE  OT Plan  OT Frequency (ACUTE ONLY) Min 2X/week  OT Treatment/Interventions (ACUTE ONLY) Self-care/ADL training;Therapeutic exercise;Neuromuscular education;Energy conservation;DME and/or AE instruction;Therapeutic activities;Manual therapy;Patient/family education;Balance training  AM-PAC OT "6 Clicks" Daily Activity Outcome Measure (Version 2)  Help from another person eating meals? 3  Help from another person taking care of personal grooming? 3  Help from another person toileting, which includes using toliet, bedpan, or urinal? 1  Help from another person bathing (including washing, rinsing, drying)? 1  Help from another person to put on and taking off regular upper body clothing? 2  Help from another person to put on and taking off regular lower body clothing? 1  6 Click Score 11  OT Recommendation  Follow Up Recommendations SNF;Supervision/Assistance - 24 hour  OT Equipment Other (comment) (TBD at next venue of care)  Individuals Consulted  Consulted and Agree with Results and Recommendations Patient  Acute Rehab OT Goals  Patient Stated Goal None stated during session  OT Goal Formulation With patient  Time For Goal Achievement 01/18/20  Potential to Achieve Goals Good  OT Time Calculation  OT Start Time (ACUTE ONLY) 1335  OT Stop Time  (ACUTE ONLY) 1421  OT Time Calculation (min) 46 min  OT General Charges  $OT Visit 1 Visit  OT Evaluation  $OT Eval Moderate Complexity 1 Mod  Written Expression  Dominant Hand Right   Stephania Macfarlane/OTS

## 2020-01-04 NOTE — Progress Notes (Signed)
PROGRESS NOTE  [name] DOD:[id number] DOB: [birth year] DOA: 01/02/2020 PCP: Nolene Ebbs, MD  HPI/Recap of past 24 hours: HPI from Dr [name] SHOSHANAH Day is a 74 y.o. 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 [name] oncologist.  Patient states she was on the wheelchair yesterday when she was trying to pick something she lost control and fell over and hit her head.  Did not lose consciousness. In the ER patient had CT head C-spine chest abdomen pelvis which shows scalp contusion and also postsurgical changes at the site where patient had the surgery for the brain mass.  There was concern for possible abscess at the site and on-call neurosurgeon Dr. Venetia Constable was consulted, blood cultures obtained and started on empiric antibiotics.  Trauma surgery also was consulted.  Labs show acute renal failure with creatinine of 1.6 creatinine was normal during last month.  Potassium initially was 7.8 but repeat was 3.6 CBC was unremarkable.  Patient admitted for possible intracranial abscess for which empiric antibiotics have been started neurosurgery has been consulted and since patient is on apixaban reversal of the apixaban was requested by neurosurgery.    Today, met patient enjoying her breakfast, denies any new complaints.  Noted drainage on scalp wound   Assessment/Plan: Principal Problem:   Pneumocephalus Active Problems:   HIV (human immunodeficiency virus infection) (HCC)   Brain mass   OSA (obstructive sleep apnea)   Head trauma status post mechanical fall Brain abscess ruled out Noted scalp wound/dehisced surgical incision Currently afebrile,  with no leukocytosis BC x2 NGTD Currently no concern for possible brain abscess, MRI was negative for any developing or evolving abscess, no hematoma noted Neurosurgery on board, no further intervention WOC consulted, appreciate recs for scalp wound care Start p.o. doxycycline for 7 days Monitor closely  History of COPD Stable Continue inhalers, nebs as needed  History of embolic CVA with left-sided hemiplegia Stable Restart Eliquis  History of PE Stable Continue Eliquis as above  HIV Last CD4 count was 714 in June 2021 Continue Biktarvy  Brain mass positive for carcinoma Previous history of lung cancer Follows with Dr. [name] oncologist Ongoing radiation therapy        Malnutrition Type:      Malnutrition Characteristics:      Nutrition Interventions:       Estimated body mass index is 29.53 kg/m as calculated from the following:   Height as of this encounter: 5' 9" (1.753 m).   Weight as of this encounter: 90.7 kg.     Code Status: Full  Family Communication: Discussed with patient  Disposition Plan: Status is: Inpatient  Remains inpatient appropriate because:Inpatient level of care appropriate due to severity of illness   Dispo: The patient is from: SNF              Anticipated d/c is to: SNF              Anticipated d/c date is: 1 day              Patient currently is medically stable to d/c.   Consultants:  Neurosurgery  Procedures:  None  Antimicrobials:  Doxycycline  DVT prophylaxis: Eliquis   Objective: Vitals:   01/04/20 0000 01/04/20 0358 01/04/20 0826 01/04/20 1243  BP:  113/64 (!) 99/57 112/60  Pulse:  68 67 75  Resp: _0 Temp:  97.9 F (36.6 C) 99 F (37.2 C) 98.1 F (36.7 C)  TempSrc:  Oral Oral Oral  SpO2:  100% 93% 95%  Weight:      Height:        Intake/Output Summary (Last 24 hours) at 01/04/2020 1650 Last data filed at 01/04/2020 0200 Gross per 24 hour  Intake 1440.9 ml  Output 425 ml    Net 1015.9 ml   Filed Weights   01/02/20 0220  Weight: 90.7 kg    Exam:  General: NAD, scalp wound noted with some drainage  Cardiovascular: S1, S2 present  Respiratory: CTAB  Abdomen: Soft, nontender, nondistended, bowel sounds present  Musculoskeletal: No bilateral pedal edema noted  Skin:  Scalp wound noted  Psychiatry: Normal mood  Neurology: Chronic left-sided hemiplegia   Data Reviewed: CBC: Recent Labs  Lab 01/02/20 0157 01/02/20 0207 01/02/20 0820 01/04/20 0421  WBC 9.0  --  9.1 7.1  NEUTROABS  --   --  6.5 4.6  HGB 12.9 13.9  13.9 11.8* 11.8*  HCT 41.5 41.0  41.0 38.6 38.4  MCV 88.1  --  88.9 89.7  PLT 238  --  205 255   Basic Metabolic Panel: Recent Labs  Lab 01/02/20 0207 01/02/20 0239 01/02/20 0820 01/04/20 0421  NA 131*  131*  --  134* 137  K 7.8*  7.8* 3.6 3.6 3.8  CL 99  99  --  99 105  CO2  --   --  24 24  GLUCOSE 107*  107*  --  99 97  BUN 69*  69*  --  36* 19  CREATININE 1.60*  1.60*  --  0.96 0.74  CALCIUM  --   --  9.1 8.9   GFR: Estimated Creatinine Clearance: 76.3 mL/min (by C-G formula based on SCr of 0.74 mg/dL). Liver Function Tests: Recent Labs  Lab 01/02/20 0820  AST 23  ALT 21  ALKPHOS 58  BILITOT 0.6  PROT 7.5  ALBUMIN 3.0*   No results for input(s): LIPASE, AMYLASE in the last 168 hours. No results for input(s): AMMONIA in the last 168 hours. Coagulation Profile: Recent Labs  Lab 01/02/20 0157  INR 1.2   Cardiac Enzymes: No results for input(s): CKTOTAL, CKMB, CKMBINDEX, TROPONINI in the last 168 hours. BNP (last 3 results) No results for input(s): PROBNP in the last 8760 hours. HbA1C: No results for input(s): HGBA1C in the last 72 hours. CBG: No results for input(s): GLUCAP in the last 168 hours. Lipid Profile: No results for input(s): CHOL, HDL, LDLCALC, TRIG, CHOLHDL, LDLDIRECT in the last 72 hours. Thyroid Function Tests: No results for input(s): TSH, T4TOTAL, FREET4, T3FREE, THYROIDAB  in the last 72 hours. Anemia Panel: No results for input(s): VITAMINB12, FOLATE, FERRITIN, TIBC, IRON, RETICCTPCT in the last 72 hours. Urine analysis: No results found for: COLORURINE, APPEARANCEUR, LABSPEC, PHURINE, GLUCOSEU, HGBUR, BILIRUBINUR, KETONESUR, PROTEINUR, UROBILINOGEN, NITRITE, LEUKOCYTESUR Sepsis Labs: _1 (procalcitonin:4,lacticidven:4)  ) Recent Results (from the past 240 hour(s))  SARS Coronavirus 2 by RT PCR (hospital order, performed in [facility] hospital lab) Nasopharyngeal Nasopharyngeal Swab     Status: None   Collection Time: 01/02/20  2:39 AM   Specimen: Nasopharyngeal Swab  Result Value Ref Range Status   SARS Coronavirus 2 NEGATIVE NEGATIVE Final  Comment: (NOTE) SARS-CoV-2 target nucleic acids are NOT DETECTED.  The SARS-CoV-2 RNA is generally detectable in upper and lower respiratory specimens during the acute phase of infection. The lowest concentration of SARS-CoV-2 viral copies this assay can detect is 250 copies / mL. A negative result does not preclude SARS-CoV-2 infection and should not be used as the sole basis for treatment or other patient management decisions.  A negative result may occur with improper specimen collection / handling, submission of specimen other than nasopharyngeal swab, presence of viral mutation(s) within the areas targeted by this assay, and inadequate number of viral copies (<250 copies / mL). A negative result must be combined with clinical observations, patient history, and epidemiological information.  Fact Sheet for Patients:   StrictlyIdeas.no  Fact Sheet for Healthcare Providers: BankingDealers.co.za  This test is not yet approved or  cleared by the [place] FDA and has been authorized for detection and/or diagnosis of SARS-CoV-2 by FDA under an Emergency Use Authorization (EUA).  This EUA will remain in effect (meaning this test can be used) for the  duration of the COVID-19 declaration under Section 564(b)(1) of the Act, 21 U.S.C. section 360bbb-3(b)(1), unless the authorization is terminated or revoked sooner.  Performed at [facility], [place] [address] [place], [place] [address]   Blood culture (routine x 2)     Status: None (Preliminary result)   Collection Time: 01/02/20  2:40 AM   Specimen: BLOOD  Result Value Ref Range Status   Specimen Description BLOOD RIGHT ARM  Final   Special Requests   Final    BOTTLES DRAWN AEROBIC AND ANAEROBIC Blood Culture adequate volume   Culture   Final    NO GROWTH 2 DAYS Performed at [facility], [place] [address] [place], [place] [address]    Report Status PENDING  Incomplete  Blood culture (routine x 2)     Status: None (Preliminary result)   Collection Time: 01/02/20  3:30 AM   Specimen: BLOOD  Result Value Ref Range Status   Specimen Description BLOOD LEFT HAND  Final   Special Requests   Final    BOTTLES DRAWN AEROBIC AND ANAEROBIC Blood Culture adequate volume   Culture   Final    NO GROWTH 2 DAYS Performed at [facility], [place] [address], [place], [place] [address]    Report Status PENDING  Incomplete      Studies: No results found.  Scheduled Meds: . bictegravir-emtricitabine-tenofovir AF  1 tablet Oral Daily  . doxycycline  100 mg Oral Q12H  . DULoxetine  20 mg Oral Daily  . gabapentin  800 mg Oral TID  . hydrOXYzine  25 mg Oral Q6H  . loratadine  10 mg Oral Daily  . mometasone-formoterol  2 puff Inhalation BID  . montelukast  10 mg Oral QHS  . polyethylene glycol  17 g Oral BID  . QUEtiapine Fumarate  150 mg Oral QHS  . rOPINIRole  1 mg Oral QHS  . senna-docusate  1 tablet Oral BID  . umeclidinium bromide  1 puff Inhalation Daily  . venlafaxine XR  150 mg Oral Daily    Continuous Infusions:    LOS: 2 days     Alma Friendly, MD [facility]  If 7PM-7AM, please contact night-coverage [email or web address] 01/04/2020, 4:50 PM

## 2020-01-04 NOTE — Evaluation (Signed)
Physical Therapy Evaluation Patient Details Name: [name] MRN: [id number] DOB: [birth year] Today's Date: 01/04/2020   History of Present Illness  Pt is a 74 y/o female with a PMH significant for HIV, lung cancer with metastasis to brain s/p craniotomy with resection 11/28/19 ([facility]), hx of CVA with L-side hemiplegia, and COPD on home O2. Presents to ED s/p fall from w/c with weakness. MRI negative for acute intracranial changes and negative for abscess however pt admitted for antibiotics for scalp incision infection.   Clinical Impression  Pt admitted with above diagnosis. At the time of PT eval pt was able to perform transfers to/from EOB and sit<>stand with use of Stedy and up to +2 max assist for balance support, safety, and correcting L lateral lean.  We opted to keep pt in the bed at end of session due to risk of sliding out of the chair 2 L lateral lean. We also opted for pt to sit on the bedpan EOB as the [facility] was also not a safe option at this time. Recommend return to SNF at d/c. Pt currently with functional limitations due to the deficits listed below (see PT Problem List). Pt will benefit from skilled PT to increase their independence and safety with mobility to allow discharge to the venue listed below.       Follow Up Recommendations SNF;Supervision/Assistance - 24 hour    Equipment Recommendations  None recommended by PT (TBD by next venue of care)    Recommendations for Other Services       Precautions / Restrictions Precautions Precautions: Fall Precaution Comments: Prior CVA; L side hemiplegia Restrictions Weight Bearing Restrictions: No      Mobility  Bed Mobility Overal bed mobility: Needs Assistance Bed Mobility: Rolling;Supine to Sit;Sit to Supine Rolling: Min assist;Mod assist   Supine to sit: Max assist;+2 for physical assistance Sit to supine: Max assist;+2 for physical assistance   General bed mobility comments: Min assist rolling L, mod assist  rolling R. +2 assist for trunk elevation to full sitting position, and to elevate LE's back up to bed at end of session.   Transfers Overall transfer level: Needs assistance   Transfers: Sit to/from Stand Sit to Stand: Max assist;+2 physical assistance;From elevated surface         General transfer comment: +2 assist for power-up to full stand with a third person helping to gain full hip extension to lower flaps of the Stedy. Assist required to facilitate weight shift to the R and maintain midline. In standing, L foot crossing over to the R side of [employer].  Ambulation/Gait             General Gait Details: Does not ambulate at baseline  Stairs            Wheelchair Mobility    Modified Rankin (Stroke Patients Only)       Balance Overall balance assessment: Needs assistance Sitting-balance support: Single extremity supported;Feet supported Sitting balance-Leahy Scale: Zero Sitting balance - Comments: Max assist due to pushing L. Pt with short bouts of being able to maintain midline but with heavy min assist due to posterior lean. Postural control: Posterior lean;Left lateral lean Standing balance support: Single extremity supported Standing balance-Leahy Scale: Zero Standing balance comment: +2 required                             Pertinent Vitals/Pain Pain Assessment: Faces Faces Pain Scale: Hurts little more  Pain Location: L shoulder, headache Pain Descriptors / Indicators: Headache;Aching Pain Intervention(s): Limited activity within patient's tolerance;Monitored during session;Repositioned    Home Living Family/patient expects to be discharged to:: Skilled nursing facility                      Prior Function Level of Independence: Needs assistance   Gait / Transfers Assistance Needed: Pt reports she uses a wheelchair, however she also mentions sitting on a rollator and peddling herself around. Unsure if she has a true  wheelchair or if she is using the rollator in this way and calling it a wheelchair.            Hand Dominance        Extremity/Trunk Assessment   Upper Extremity Assessment Upper Extremity Assessment: LUE deficits/detail LUE Deficits / Details: Appears flaccid; painful; noted 4/5 shoulder subluxation.     Lower Extremity Assessment Lower Extremity Assessment: RLE deficits/detail;LLE deficits/detail RLE Deficits / Details: Generalized weakness. Noted pt crossing ankles - initially R foot crossing over to the L in sitting, but in standing noted the L foot crossing over to the right.  LLE Deficits / Details: Hemiplegic LLE consistent with prior CVA    Cervical / Trunk Assessment Cervical / Trunk Assessment: Other exceptions Cervical / Trunk Exceptions: Forward head posture with rounded shoulders  Communication      Cognition Arousal/Alertness: Awake/alert Behavior During Therapy: WFL for tasks assessed/performed Overall Cognitive Status: Impaired/Different from baseline Area of Impairment: Memory;Following commands;Safety/judgement;Awareness;Problem solving                     Memory: Decreased short-term memory Following Commands: Follows one step commands consistently;Follows multi-step commands inconsistently Safety/Judgement: Decreased awareness of safety Awareness: Intellectual Problem Solving: Slow processing;Decreased initiation;Requires verbal cues;Requires tactile cues;Difficulty sequencing General Comments: Pt reports she has been told she is hallucinating but does not think she is. Then goes on to tell therapist that a black cat followed her to her room from upstairs.       General Comments      Exercises Other Exercises Other Exercises: R lateral lean onto elbow throughout session to correct for L pushing.    Assessment/Plan    PT Assessment Patient needs continued PT services  PT Problem List Decreased strength;Decreased range of motion;Decreased  activity tolerance;Decreased balance;Decreased mobility;Decreased coordination;Decreased knowledge of use of DME;Decreased cognition;Decreased safety awareness;Decreased knowledge of precautions;Pain;Impaired tone       PT Treatment Interventions DME instruction;Functional mobility training;Therapeutic activities;Therapeutic exercise;Neuromuscular re-education;Cognitive remediation;Patient/family education;Wheelchair mobility training    PT Goals (Current goals can be found in the Care Plan section)  Acute Rehab PT Goals Patient Stated Goal: None stated during session PT Goal Formulation: With patient Time For Goal Achievement: 01/18/20 Potential to Achieve Goals: Fair    Frequency Min 2X/week   Barriers to discharge        Co-evaluation PT/OT/SLP Co-Evaluation/Treatment: Yes Reason for Co-Treatment: Complexity of the patient's impairments (multi-system involvement);Necessary to address cognition/behavior during functional activity;For patient/therapist safety;To address functional/ADL transfers PT goals addressed during session: Mobility/safety with mobility;Balance;Proper use of DME         AM-PAC PT "6 Clicks" Mobility  Outcome Measure Help needed turning from your back to your side while in a flat bed without using bedrails?: A Little Help needed moving from lying on your back to sitting on the side of a flat bed without using bedrails?: A Lot Help needed moving to and from a bed to a  chair (including a wheelchair)?: Total Help needed standing up from a chair using your arms (e.g., wheelchair or bedside chair)?: A Lot Help needed to walk in hospital room?: Total Help needed climbing 3-5 steps with a railing? : Total 6 Click Score: 10    End of Session Equipment Utilized During Treatment: Gait belt;Oxygen Activity Tolerance: Patient tolerated treatment well Patient left: in bed;with call bell/phone within reach;with bed alarm set;Other (comment) (CSW present) Nurse  Communication: Mobility status;Need for lift equipment PT Visit Diagnosis: Muscle weakness (generalized) (M62.81);Hemiplegia and hemiparesis Hemiplegia - Right/Left: Left Hemiplegia - dominant/non-dominant: Non-dominant Hemiplegia - caused by: Cerebral infarction (Prior to this admission)    Time: 4299-8069 PT Time Calculation (min) (ACUTE ONLY): 49 min   Charges:   PT Evaluation $PT Eval High Complexity: 1 High PT Treatments $Therapeutic Activity: 8-22 mins        Rolinda Roan, PT, DPT Acute Rehabilitation Services Pager: [phone] Office: [phone]   Thelma Comp 01/04/2020, 3:10 PM

## 2020-01-04 NOTE — Progress Notes (Signed)
Neurosurgery Service Progress Note  Subjective: No acute events overnight, was asking me this morning about some bugs she said she sent my office for analysis   Objective: Vitals:   01/03/20 2331 01/04/20 0000 01/04/20 0358 01/04/20 0826  BP: (!) 104/56  113/64 (!) 99/57  Pulse: 81  68 67  Resp: 19 17 18 18   Temp: 98.6 F (37 C)  97.9 F (36.6 C) 99 F (37.2 C)  TempSrc: Oral  Oral Oral  SpO2: 93%  100% 93%  Weight:      Height:       Temp (24hrs), Avg:98.4 F (36.9 C), Min:97.9 F (36.6 C), Max:99 F (37.2 C)  CBC Latest Ref Rng & Units 01/04/2020 01/02/2020 01/02/2020  WBC 4.0 - 10.5 K/uL 7.1 9.1 -  Hemoglobin 12.0 - 15.0 g/dL 11.8(L) 11.8(L) 13.9  Hematocrit 36 - 46 % 38.4 38.6 41.0  Platelets 150 - 400 K/uL 236 205 -   BMP Latest Ref Rng & Units 01/04/2020 01/02/2020 01/02/2020  Glucose 70 - 99 mg/dL 97 99 -  BUN 8 - 23 mg/dL 19 36(H) -  Creatinine 0.44 - 1.00 mg/dL 0.74 0.96 -  Sodium 135 - 145 mmol/L 137 134(L) -  Potassium 3.5 - 5.1 mmol/L 3.8 3.6 3.6  Chloride 98 - 111 mmol/L 105 99 -  CO2 22 - 32 mmol/L 24 24 -  Calcium 8.9 - 10.3 mg/dL 8.9 9.1 -    Intake/Output Summary (Last 24 hours) at 01/04/2020 0848 Last data filed at 01/04/2020 0200 Gross per 24 hour  Intake 1560.9 ml  Output 625 ml  Net 935.9 ml    Current Facility-Administered Medications:  .  0.9 %  sodium chloride infusion, , Intravenous, Continuous, Alma Friendly, MD, Last Rate: 75 mL/hr at 01/04/20 0023, New Bag at 01/04/20 0023 .  albuterol (PROVENTIL) (2.5 MG/3ML) 0.083% nebulizer solution 2.5 mg, 2.5 mg, Nebulization, Q4H PRN, Hosie Poisson, MD .  bictegravir-emtricitabine-tenofovir AF (BIKTARVY) 50-200-25 MG per tablet 1 tablet, 1 tablet, Oral, Daily, Alma Friendly, MD, 1 tablet at 01/03/20 1048 .  DULoxetine (CYMBALTA) DR capsule 20 mg, 20 mg, Oral, Daily, Hosie Poisson, MD, 20 mg at 01/03/20 1044 .  gabapentin (NEURONTIN) capsule 800 mg, 800 mg, Oral, TID, Hosie Poisson, MD, 800 mg at  01/03/20 2043 .  gadobutrol (GADAVIST) 1 MMOL/ML injection 9 mL, 9 mL, Intravenous, Once PRN, Palumbo, April, MD .  hydrOXYzine (ATARAX/VISTARIL) tablet 25 mg, 25 mg, Oral, Q6H, Hosie Poisson, MD, 25 mg at 01/04/20 0555 .  loratadine (CLARITIN) tablet 10 mg, 10 mg, Oral, Daily, Hosie Poisson, MD, 10 mg at 01/03/20 1045 .  mometasone-formoterol (DULERA) 200-5 MCG/ACT inhaler 2 puff, 2 puff, Inhalation, BID, Hosie Poisson, MD, 2 puff at 01/03/20 2055 .  montelukast (SINGULAIR) tablet 10 mg, 10 mg, Oral, QHS, Hosie Poisson, MD, 10 mg at 01/03/20 2044 .  ondansetron (ZOFRAN) tablet 4 mg, 4 mg, Oral, Q6H PRN **OR** ondansetron (ZOFRAN) injection 4 mg, 4 mg, Intravenous, Q6H PRN, Rise Patience, MD .  polyethylene glycol (MIRALAX / GLYCOLAX) packet 17 g, 17 g, Oral, BID, Alma Friendly, MD, 17 g at 01/03/20 2041 .  QUEtiapine (SEROQUEL XR) 24 hr tablet 150 mg, 150 mg, Oral, QHS, Hosie Poisson, MD, 150 mg at 01/03/20 2044 .  rOPINIRole (REQUIP) tablet 1 mg, 1 mg, Oral, QHS, Hosie Poisson, MD, 1 mg at 01/03/20 2043 .  senna-docusate (Senokot-S) tablet 1 tablet, 1 tablet, Oral, BID, Alma Friendly, MD, 1 tablet at 01/03/20 2043 .  tiZANidine (ZANAFLEX) tablet 4 mg, 4 mg, Oral, BID PRN, Hosie Poisson, MD .  traMADol (ULTRAM) tablet 50 mg, 50 mg, Oral, Q8H PRN, Alma Friendly, MD, 50 mg at 01/03/20 2043 .  umeclidinium bromide (INCRUSE ELLIPTA) 62.5 MCG/INH 1 puff, 1 puff, Inhalation, Daily, Hosie Poisson, MD, 1 puff at 01/03/20 0940 .  venlafaxine XR (EFFEXOR-XR) 24 hr capsule 150 mg, 150 mg, Oral, Daily, Hosie Poisson, MD, 150 mg at 01/03/20 1053   Physical Exam: AOx3, PERRL, gaze neutral, L hemiparesis Incision with purulent material and small area of dehiscence ventrally  Assessment & Plan: 74 y.o. 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 Post-op left sided weakness after resection, now s/p fall out of wheelchair on eliquis.  Exam with stable L sided weakness, likely post-op wound infection. MRI without intracranial abscess.   -while she doesn't have an intracerebral abscess, she still has an obvious scalp / wound infection that needs to be treated. Recommend restarting antibiotics. -okay to restart anticoagulation from my standpoint  [name]  01/04/20 8:48 AM

## 2020-01-04 NOTE — NC FL2 (Signed)
[place] LEVEL OF CARE SCREENING TOOL     IDENTIFICATION  Patient Name: [name] Birthdate: 04/28/1946 Sex: female Admission Date (Current Location): 01/02/2020  [facility] and [place] Number:  Herbalist and Address:  [facility] [place]. [facility], [place] [address], [place], [place] [address]      Provider Number: [id number]  Attending Physician Name and Address:  Alma Friendly, MD  Relative Name and Phone Number:       Current Level of Care: Hospital Recommended Level of Care: [place] Prior Approval Number:    Date Approved/Denied:   PASRR Number: [id number], Expires 01/05/20  Discharge Plan: SNF    Current Diagnoses: Patient Active Problem List   Diagnosis Date Noted  . 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 HIV (human immunodeficiency virus infection) ([place]) 01/02/2020  . Brain mass 01/02/2020  . OSA (obstructive sleep apnea) 01/02/2020    Orientation RESPIRATION BLADDER Height & Weight     Self, Situation, Place, Time  O2, Other (Comment) ([place] 2L, CPAP at night) Incontinent Weight: 200 lb (90.7 kg) Height:  5\' 9"  (175.3 cm)  BEHAVIORAL SYMPTOMS/MOOD NEUROLOGICAL BOWEL NUTRITION STATUS      Continent Diet (see DC summary)  AMBULATORY STATUS COMMUNICATION OF NEEDS Skin   Extensive Assist Verbally Normal                       Personal Care Assistance Level of Assistance  Bathing, Dressing, Feeding Bathing Assistance: Maximum assistance Feeding assistance: Limited assistance Dressing Assistance: Maximum assistance     Functional Limitations Info  Speech     Speech Info: Impaired (dysarthria)    SPECIAL CARE FACTORS FREQUENCY  PT (By licensed PT), OT (By licensed OT)     PT Frequency: 5x/wk OT Frequency: 5x/wk            Contractures Contractures Info: Not present    Additional Factors Info  Code Status, Allergies, Psychotropic Code Status Info: Full Allergies Info: NKA Psychotropic Info:  Cymbalta 20mg  daily, Seroquel 150mg  daily at bed, Effexor-XR 150mg  daily         Current Medications (01/04/2020):  This is the current hospital active medication list Current Facility-Administered Medications  Medication Dose Route Frequency Provider Last Rate Last Admin  . albuterol (PROVENTIL) (2.5 MG/3ML) 0.083% nebulizer solution 2.5 mg  2.5 mg Nebulization Q4H PRN Hosie Poisson, MD      . bictegravir-emtricitabine-tenofovir AF (BIKTARVY) 50-200-25 MG per tablet 1 tablet  1 tablet Oral Daily Alma Friendly, MD   1 tablet at 01/04/20 0930  . doxycycline (VIBRA-TABS) tablet 100 mg  100 mg Oral Q12H Alma Friendly, MD   100 mg at 01/04/20 1121  . DULoxetine (CYMBALTA) DR capsule 20 mg  20 mg Oral Daily Hosie Poisson, MD   20 mg at 01/04/20 0930  . gabapentin (NEURONTIN) capsule 800 mg  800 mg Oral TID Hosie Poisson, MD   800 mg at 01/04/20 0930  . gadobutrol (GADAVIST) 1 MMOL/ML injection 9 mL  9 mL Intravenous Once PRN Palumbo, April, MD      . hydrOXYzine (ATARAX/VISTARIL) tablet 25 mg  25 mg Oral Q6H Hosie Poisson, MD   25 mg at 01/04/20 1116  . loratadine (CLARITIN) tablet 10 mg  10 mg Oral Daily Hosie Poisson, MD   10 mg at 01/04/20 0929  . mometasone-formoterol (DULERA) 200-5 MCG/ACT inhaler 2 puff  2 puff Inhalation BID Hosie Poisson, MD   2 puff at 01/04/20  [id number]  . montelukast (SINGULAIR) tablet 10 mg  10 mg Oral QHS Hosie Poisson, MD   10 mg at 01/03/20 2044  . ondansetron (ZOFRAN) tablet 4 mg  4 mg Oral Q6H PRN Rise Patience, MD       Or  . ondansetron [facility]) injection 4 mg  4 mg Intravenous Q6H PRN Rise Patience, MD      . polyethylene glycol (MIRALAX / GLYCOLAX) packet 17 g  17 g Oral BID Alma Friendly, MD   17 g at 01/04/20 0931  . QUEtiapine (SEROQUEL XR) 24 hr tablet 150 mg  150 mg Oral QHS Hosie Poisson, MD   150 mg at 01/03/20 2044  . rOPINIRole (REQUIP) tablet 1 mg  1 mg Oral QHS Hosie Poisson, MD   1 mg at 01/03/20 2043  . senna-docusate  (Senokot-S) tablet 1 tablet  1 tablet Oral BID Alma Friendly, MD   1 tablet at 01/04/20 0930  . tiZANidine (ZANAFLEX) tablet 4 mg  4 mg Oral BID PRN Hosie Poisson, MD      . traMADol (ULTRAM) tablet 50 mg  50 mg Oral Q8H PRN Alma Friendly, MD   50 mg at 01/04/20 0929  . umeclidinium bromide (INCRUSE ELLIPTA) 62.5 MCG/INH 1 puff  1 puff Inhalation Daily Hosie Poisson, MD   1 puff at 01/04/20 0925  . venlafaxine XR (EFFEXOR-XR) 24 hr capsule 150 mg  150 mg Oral Daily Hosie Poisson, MD   150 mg at 01/04/20 0321     Discharge Medications: Please see discharge summary for a list of discharge medications.  Relevant Imaging Results:  Relevant Lab Results:   Additional Information SS#: [id number]  Geralynn Ochs, LCSW

## 2020-01-05 ENCOUNTER — Ambulatory Visit: Payer: Medicare Other | Admitting: Radiation Oncology

## 2020-01-05 ENCOUNTER — Encounter: Payer: Self-pay | Admitting: Radiation Therapy

## 2020-01-05 LAB — CBC WITH DIFFERENTIAL/PLATELET
Abs Immature Granulocytes: 0.05 10*3/uL (ref 0.00–0.07)
Basophils Absolute: 0 10*3/uL (ref 0.0–0.1)
Basophils Relative: 0 %
Eosinophils Absolute: 0.1 10*3/uL (ref 0.0–0.5)
Eosinophils Relative: 1 %
HCT: 42.9 % (ref 36.0–46.0)
Hemoglobin: 13.2 g/dL (ref 12.0–15.0)
Immature Granulocytes: 1 %
Lymphocytes Relative: 21 %
Lymphs Abs: 1.7 10*3/uL (ref 0.7–4.0)
MCH: 27.3 pg (ref 26.0–34.0)
MCHC: 30.8 g/dL (ref 30.0–36.0)
MCV: 88.6 fL (ref 80.0–100.0)
Monocytes Absolute: 0.6 10*3/uL (ref 0.1–1.0)
Monocytes Relative: 7 %
Neutro Abs: 5.8 10*3/uL (ref 1.7–7.7)
Neutrophils Relative %: 70 %
Platelets: 269 10*3/uL (ref 150–400)
RBC: 4.84 MIL/uL (ref 3.87–5.11)
RDW: 14.2 % (ref 11.5–15.5)
WBC: 8.2 10*3/uL (ref 4.0–10.5)
nRBC: 0 % (ref 0.0–0.2)

## 2020-01-05 LAB — BASIC METABOLIC PANEL
Anion gap: 11 (ref 5–15)
BUN: 17 mg/dL (ref 8–23)
CO2: 25 mmol/L (ref 22–32)
Calcium: 9.1 mg/dL (ref 8.9–10.3)
Chloride: 101 mmol/L (ref 98–111)
Creatinine, Ser: 0.71 mg/dL (ref 0.44–1.00)
GFR calc Af Amer: >60 mL/min (ref >60)
GFR calc non Af Amer: >60 mL/min (ref >60)
Glucose, Bld: 95 mg/dL (ref 70–99)
Potassium: 4.2 mmol/L (ref 3.5–5.1)
Sodium: 137 mmol/L (ref 135–145)

## 2020-01-05 NOTE — Progress Notes (Addendum)
Due to the patient's scalp wound infection, Dr. Zada Finders has requested we hold the radiation treatment to allow time for healing. We will plan to resume on Wed 7/14 unless instructed otherwise.   Mont Dutton R.T.(R)(T) Radiation Special Procedures Navigator   UPDATE 01/10/20: I reached back out to Dr. Zada Finders to see if [name]'s scalp wound has improved and is OK to resume SRS.  He said no, she will need more time to heal due to her compromised immune system. Dr. Zada Finders plans to see her again in the clinic and will let us know when we can resume. Dr. Lisbeth Renshaw and his nurse have been informed and her treatments have been cancelled until further notice.   Su Hoff R.T.(R)(T) Radiation Special Procedures Navigator

## 2020-01-05 NOTE — Plan of Care (Signed)
  Problem: Education: Goal: Knowledge of General Education information will improve Description: Including pain rating scale, medication(s)/side effects and non-pharmacologic comfort measures Outcome: Progressing   Problem: Nutrition: Goal: Adequate nutrition will be maintained Outcome: Progressing   Problem: Safety: Goal: Ability to remain free from injury will improve Outcome: Progressing   

## 2020-01-05 NOTE — Progress Notes (Signed)
Neurosurgery Service Progress Note  Subjective: No acute events overnight, was asking me this morning about some bugs she said she sent my office for analysis   Objective: Vitals:   01/05/20 0444 01/05/20 0817 01/05/20 0848 01/05/20 0849  BP: 125/73 100/65    Pulse: 65 73 77   Resp: 16 15 16    Temp: 97.8 F (36.6 C) 97.7 F (36.5 C)    TempSrc:  Oral    SpO2: 100% 99% 96% 96%  Weight:      Height:       Temp (24hrs), Avg:98.1 F (36.7 C), Min:97.7 F (36.5 C), Max:98.6 F (37 C)  CBC Latest Ref Rng & Units 01/04/2020 01/02/2020 01/02/2020  WBC 4.0 - 10.5 K/uL 7.1 9.1 -  Hemoglobin 12.0 - 15.0 g/dL 11.8(L) 11.8(L) 13.9  Hematocrit 36 - 46 % 38.4 38.6 41.0  Platelets 150 - 400 K/uL 236 205 -   BMP Latest Ref Rng & Units 01/05/2020 01/04/2020 01/02/2020  Glucose 70 - 99 mg/dL 95 97 99  BUN 8 - 23 mg/dL 17 19 36(H)  Creatinine 0.44 - 1.00 mg/dL 0.71 0.74 0.96  Sodium 135 - 145 mmol/L 137 137 134(L)  Potassium 3.5 - 5.1 mmol/L 4.2 3.8 3.6  Chloride 98 - 111 mmol/L 101 105 99  CO2 22 - 32 mmol/L 25 24 24   Calcium 8.9 - 10.3 mg/dL 9.1 8.9 9.1    Intake/Output Summary (Last 24 hours) at 01/05/2020 0858 Last data filed at 01/05/2020 0444 Gross per 24 hour  Intake --  Output 900 ml  Net -900 ml    Current Facility-Administered Medications:    albuterol (PROVENTIL) (2.5 MG/3ML) 0.083% nebulizer solution 2.5 mg, 2.5 mg, Nebulization, Q4H PRN, Hosie Poisson, MD   apixaban (ELIQUIS) tablet 5 mg, 5 mg, Oral, BID, Alma Friendly, MD, 5 mg at 01/04/20 2213   bictegravir-emtricitabine-tenofovir AF (BIKTARVY) 50-200-25 MG per tablet 1 tablet, 1 tablet, Oral, Daily, Alma Friendly, MD, 1 tablet at 01/04/20 0930   doxycycline (VIBRA-TABS) tablet 100 mg, 100 mg, Oral, Q12H, Alma Friendly, MD, 100 mg at 01/04/20 2213   DULoxetine (CYMBALTA) DR capsule 20 mg, 20 mg, Oral, Daily, Hosie Poisson, MD, 20 mg at 01/04/20 0930   gabapentin (NEURONTIN) capsule 800 mg, 800 mg, Oral,  TID, Hosie Poisson, MD, 800 mg at 01/04/20 2213   gadobutrol (GADAVIST) 1 MMOL/ML injection 9 mL, 9 mL, Intravenous, Once PRN, Palumbo, April, MD   hydrOXYzine (ATARAX/VISTARIL) tablet 25 mg, 25 mg, Oral, Q6H, Karleen Hampshire, Vijaya, MD, 25 mg at 01/05/20 0653   loratadine (CLARITIN) tablet 10 mg, 10 mg, Oral, Daily, Hosie Poisson, MD, 10 mg at 01/04/20 0929   mometasone-formoterol (DULERA) 200-5 MCG/ACT inhaler 2 puff, 2 puff, Inhalation, BID, Hosie Poisson, MD, 2 puff at 01/05/20 0848   montelukast (SINGULAIR) tablet 10 mg, 10 mg, Oral, QHS, Akula, Vijaya, MD, 10 mg at 01/04/20 2213   ondansetron (ZOFRAN) tablet 4 mg, 4 mg, Oral, Q6H PRN **OR** ondansetron (ZOFRAN) injection 4 mg, 4 mg, Intravenous, Q6H PRN, Rise Patience, MD   polyethylene glycol (MIRALAX / GLYCOLAX) packet 17 g, 17 g, Oral, BID, Alma Friendly, MD, 17 g at 01/04/20 2214   QUEtiapine (SEROQUEL XR) 24 hr tablet 150 mg, 150 mg, Oral, QHS, Akula, Vijaya, MD, 150 mg at 01/04/20 2214   rOPINIRole (REQUIP) tablet 1 mg, 1 mg, Oral, QHS, Akula, Vijaya, MD, 1 mg at 01/04/20 2213   senna-docusate (Senokot-S) tablet 1 tablet, 1 tablet, Oral, BID, Lesia Sago  J, MD, 1 tablet at 01/04/20 2213   tiZANidine (ZANAFLEX) tablet 4 mg, 4 mg, Oral, BID PRN, Hosie Poisson, MD   traMADol (ULTRAM) tablet 50 mg, 50 mg, Oral, Q8H PRN, Alma Friendly, MD, 50 mg at 01/04/20 2051   umeclidinium bromide (INCRUSE ELLIPTA) 62.5 MCG/INH 1 puff, 1 puff, Inhalation, Daily, Hosie Poisson, MD, 1 puff at 01/05/20 0848   venlafaxine XR (EFFEXOR-XR) 24 hr capsule 150 mg, 150 mg, Oral, Daily, Hosie Poisson, MD, 150 mg at 01/04/20 5615   Physical Exam: AOx3, PERRL, gaze neutral, L hemiparesis Incision with purulent material and small area of dehiscence ventrally  Assessment & Plan: 74 y.o. 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 Post-op left sided weakness after resection, now s/p fall out of wheelchair on eliquis.  Exam with stable L sided weakness, likely post-op wound infection. MRI without intracranial abscess.   -on ABx for wound infection -okay to continue anticoagulation from my standpoint -notified rad onc re wound infection, will pause RT until the wound heals  [name]  01/05/20 8:58 AM

## 2020-01-05 NOTE — Progress Notes (Signed)
PROGRESS NOTE  [name] YYT:[id number] DOB: 09/25/1945 DOA: 01/02/2020 PCP: Nolene Ebbs, MD  HPI/Recap of past 24 hours: HPI from Dr [name] [name] is a 74 y.o. 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 [name] oncologist.  Patient states she was on the wheelchair yesterday when she was trying to pick something she lost control and fell over and hit her head.  Did not lose consciousness. In the ER patient had CT head C-spine chest abdomen pelvis which shows scalp contusion and also postsurgical changes at the site where patient had the surgery for the brain mass.  There was concern for possible abscess at the site and on-call neurosurgeon Dr. Venetia Constable was consulted, blood cultures obtained and started on empiric antibiotics.  Trauma surgery also was consulted.  Labs show acute renal failure with creatinine of 1.6 creatinine was normal during last month.  Potassium initially was 7.8 but repeat was 3.6 CBC was unremarkable.  Patient admitted for possible intracranial abscess for which empiric antibiotics have been started neurosurgery has been consulted and since patient is on apixaban reversal of the apixaban was requested by neurosurgery.    Today, patient denies any new complaints.   Assessment/Plan: Principal Problem:   Pneumocephalus Active Problems:   HIV (human immunodeficiency virus infection) (HCC)   Brain mass   OSA (obstructive sleep apnea)   Head trauma status post mechanical fall Brain abscess ruled out Noted scalp wound/dehisced surgical incision Currently afebrile, with no leukocytosis BC x2 NGTD Currently no concern for  possible brain abscess, MRI was negative for any developing or evolving abscess, no hematoma noted Neurosurgery on board, no further intervention WOC consulted, appreciate recs for scalp wound care Continue p.o. doxycycline for 7 days Monitor closely  History of COPD Stable Continue inhalers, nebs as needed  History of embolic CVA with left-sided hemiplegia Stable Continue Eliquis  History of PE Stable Continue Eliquis as above  HIV Last CD4 count was 714 in June 2021 Continue Biktarvy  Brain mass positive for carcinoma Previous history of lung cancer Follows with Dr. [name] oncologist Ongoing radiation therapy        Malnutrition Type:      Malnutrition Characteristics:      Nutrition Interventions:       Estimated body mass index is 29.53 kg/m as calculated from the following:   Height as of this encounter: 5\' 9"  (1.753 m).   Weight as of this encounter: 90.7 kg.     Code Status: Full  Family Communication: Discussed with patient  Disposition Plan: Status is: Inpatient  Remains inpatient appropriate because:Inpatient level of care appropriate due to severity of illness   Dispo: The patient is from: SNF              Anticipated d/c is to: SNF              Anticipated d/c date is: 1 day              Patient currently is medically stable to d/c. Awaiting SNF placement   Consultants:  Neurosurgery  Procedures:  None  Antimicrobials:  Doxycycline  DVT prophylaxis: Eliquis   Objective: Vitals:  01/05/20 0848 01/05/20 0849 01/05/20 1250 01/05/20 1637  BP:   109/70 (!) 110/58  Pulse: 77   71  Resp: 16  17 12   Temp:   98.1 F (36.7 C) 99 F (37.2 C)  TempSrc:   Axillary Oral  SpO2: 96% 96% 96% 100%  Weight:      Height:        Intake/Output Summary (Last 24 hours) at 01/05/2020 1705 Last data filed at 01/05/2020 0444 Gross per 24 hour  Intake --  Output 900 ml  Net -900 ml   Filed Weights   01/02/20 0220  Weight: 90.7  kg    Exam:  General: NAD, scalp wound noted  Cardiovascular: S1, S2 present  Respiratory: CTAB  Abdomen: Soft, nontender, nondistended, bowel sounds present  Musculoskeletal: No bilateral pedal edema noted  Skin:  Scalp wound noted  Psychiatry: Normal mood  Neurology: Chronic left-sided hemiplegia   Data Reviewed: CBC: Recent Labs  Lab 01/02/20 0157 01/02/20 0207 01/02/20 0820 01/04/20 0421 01/05/20 1017  WBC 9.0  --  9.1 7.1 8.2  NEUTROABS  --   --  6.5 4.6 5.8  HGB 12.9 13.9  13.9 11.8* 11.8* 13.2  HCT 41.5 41.0  41.0 38.6 38.4 42.9  MCV 88.1  --  88.9 89.7 88.6  PLT 238  --  205 236 502   Basic Metabolic Panel: Recent Labs  Lab 01/02/20 0207 01/02/20 0239 01/02/20 0820 01/04/20 0421 01/05/20 0629  NA 131*  131*  --  134* 137 137  K 7.8*  7.8* 3.6 3.6 3.8 4.2  CL 99  99  --  99 105 101  CO2  --   --  24 24 25   GLUCOSE 107*  107*  --  99 97 95  BUN 69*  69*  --  36* 19 17  CREATININE 1.60*  1.60*  --  0.96 0.74 0.71  CALCIUM  --   --  9.1 8.9 9.1   GFR: Estimated Creatinine Clearance: 76.3 mL/min (by C-G formula based on SCr of 0.71 mg/dL). Liver Function Tests: Recent Labs  Lab 01/02/20 0820  AST 23  ALT 21  ALKPHOS 58  BILITOT 0.6  PROT 7.5  ALBUMIN 3.0*   No results for input(s): LIPASE, AMYLASE in the last 168 hours. No results for input(s): AMMONIA in the last 168 hours. Coagulation Profile: Recent Labs  Lab 01/02/20 0157  INR 1.2   Cardiac Enzymes: No results for input(s): CKTOTAL, CKMB, CKMBINDEX, TROPONINI in the last 168 hours. BNP (last 3 results) No results for input(s): PROBNP in the last 8760 hours. HbA1C: No results for input(s): HGBA1C in the last 72 hours. CBG: No results for input(s): GLUCAP in the last 168 hours. Lipid Profile: No results for input(s): CHOL, HDL, LDLCALC, TRIG, CHOLHDL, LDLDIRECT in the last 72 hours. Thyroid Function Tests: No results for input(s): TSH, T4TOTAL, FREET4, T3FREE,  THYROIDAB in the last 72 hours. Anemia Panel: No results for input(s): VITAMINB12, FOLATE, FERRITIN, TIBC, IRON, RETICCTPCT in the last 72 hours. Urine analysis: No results found for: COLORURINE, APPEARANCEUR, LABSPEC, PHURINE, GLUCOSEU, HGBUR, BILIRUBINUR, KETONESUR, PROTEINUR, UROBILINOGEN, NITRITE, LEUKOCYTESUR Sepsis Labs: @LABRCNTIP (procalcitonin:4,lacticidven:4)  ) Recent Results (from the past 240 hour(s))  SARS Coronavirus 2 by RT PCR (hospital order, performed in [facility] hospital lab) Nasopharyngeal Nasopharyngeal Swab     Status: None   Collection Time: 01/02/20  2:39 AM   Specimen: Nasopharyngeal Swab  Result Value Ref Range Status   SARS Coronavirus 2 NEGATIVE NEGATIVE Final  Comment: (NOTE) SARS-CoV-2 target nucleic acids are NOT DETECTED.  The SARS-CoV-2 RNA is generally detectable in upper and lower respiratory specimens during the acute phase of infection. The lowest concentration of SARS-CoV-2 viral copies this assay can detect is 250 copies / mL. A negative result does not preclude SARS-CoV-2 infection and should not be used as the sole basis for treatment or other patient management decisions.  A negative result may occur with improper specimen collection / handling, submission of specimen other than nasopharyngeal swab, presence of viral mutation(s) within the areas targeted by this assay, and inadequate number of viral copies (<250 copies / mL). A negative result must be combined with clinical observations, patient history, and epidemiological information.  Fact Sheet for Patients:   StrictlyIdeas.no  Fact Sheet for Healthcare Providers: BankingDealers.co.za  This test is not yet approved or  cleared by the [place] FDA and has been authorized for detection and/or diagnosis of SARS-CoV-2 by FDA under an Emergency Use Authorization (EUA).  This EUA will remain in effect (meaning this test can be used)  for the duration of the COVID-19 declaration under Section 564(b)(1) of the Act, 21 U.S.C. section 360bbb-3(b)(1), unless the authorization is terminated or revoked sooner.  Performed at [facility], [place] [address], [place], [place] [address]   Blood culture (routine x 2)     Status: None (Preliminary result)   Collection Time: 01/02/20  2:40 AM   Specimen: BLOOD  Result Value Ref Range Status   Specimen Description BLOOD RIGHT ARM  Final   Special Requests   Final    BOTTLES DRAWN AEROBIC AND ANAEROBIC Blood Culture adequate volume   Culture   Final    NO GROWTH 3 DAYS Performed at [facility], [address], [place], [place] [address]    Report Status PENDING  Incomplete  Blood culture (routine x 2)     Status: None (Preliminary result)   Collection Time: 01/02/20  3:30 AM   Specimen: BLOOD  Result Value Ref Range Status   Specimen Description BLOOD LEFT HAND  Final   Special Requests   Final    BOTTLES DRAWN AEROBIC AND ANAEROBIC Blood Culture adequate volume   Culture   Final    NO GROWTH 3 DAYS Performed at [facility], [place] [address] [place], [place] [address]    Report Status PENDING  Incomplete      Studies: No results found.  Scheduled Meds: . apixaban  5 mg Oral BID  . bictegravir-emtricitabine-tenofovir AF  1 tablet Oral Daily  . doxycycline  100 mg Oral Q12H  . DULoxetine  20 mg Oral Daily  . gabapentin  800 mg Oral TID  . hydrOXYzine  25 mg Oral Q6H  . loratadine  10 mg Oral Daily  . mometasone-formoterol  2 puff Inhalation BID  . montelukast  10 mg Oral QHS  . polyethylene glycol  17 g Oral BID  . QUEtiapine Fumarate  150 mg Oral QHS  . rOPINIRole  1 mg Oral QHS  . senna-docusate  1 tablet Oral BID  . umeclidinium bromide  1 puff Inhalation Daily  . venlafaxine XR  150 mg Oral Daily    Continuous Infusions:    LOS: 3 days     Alma Friendly, MD [facility]  If 7PM-7AM, please contact  night-coverage [email or web address] 01/05/2020, 5:05 PM

## 2020-01-05 NOTE — Plan of Care (Signed)
Pt alert and oriented. Pt on 2L [place] during the day and CPAP at night. Purewick in place. Pt turned q2hr.  Problem: Education: Goal: Knowledge of General Education information will improve Description: Including pain rating scale, medication(s)/side effects and non-pharmacologic comfort measures Outcome: Progressing   Problem: Health Behavior/Discharge Planning: Goal: Ability to manage health-related needs will improve Outcome: Progressing   Problem: Clinical Measurements: Goal: Ability to maintain clinical measurements within normal limits will improve Outcome: Progressing Goal: Will remain free from infection Outcome: Progressing Goal: Diagnostic test results will improve Outcome: Progressing Goal: Respiratory complications will improve Outcome: Progressing Goal: Cardiovascular complication will be avoided Outcome: Progressing   Problem: Activity: Goal: Risk for activity intolerance will decrease Outcome: Progressing   Problem: Nutrition: Goal: Adequate nutrition will be maintained Outcome: Progressing   Problem: Coping: Goal: Level of anxiety will decrease Outcome: Progressing   Problem: Elimination: Goal: Will not experience complications related to bowel motility Outcome: Progressing Goal: Will not experience complications related to urinary retention Outcome: Progressing   Problem: Pain Managment: Goal: General experience of comfort will improve Outcome: Progressing   Problem: Safety: Goal: Ability to remain free from injury will improve Outcome: Progressing   Problem: Skin Integrity: Goal: Risk for impaired skin integrity will decrease Outcome: Progressing

## 2020-01-05 NOTE — Progress Notes (Signed)
Spoke with nurse Sharyn Lull to let her know that the patient is scheduled for a radiation treatment today at 12:00 pm.  I asked her to call and set up carelink to have her here at 11:45 am.  I asked that she please give her some pain medication before she leaves the floor.  She verbalized understanding.  Will continue to follow as necessary.  Gloriajean Dell. Leonie Green, BSN

## 2020-01-06 DIAGNOSIS — Z21 Asymptomatic human immunodeficiency virus [HIV] infection status: Secondary | ICD-10-CM

## 2020-01-06 MED ORDER — DOXYCYCLINE HYCLATE 100 MG PO TABS: 100.0000 mg | ORAL_TABLET | Freq: Two times a day (BID) | ORAL | 0 refills | Status: AC

## 2020-01-06 MED ORDER — NICOTINE 21 MG/24HR TD PT24
21.0000 mg | MEDICATED_PATCH | Freq: Every day | TRANSDERMAL | Status: DC
  Administered 2020-01-06: 21 mg via TRANSDERMAL
  Filled 2020-01-06: qty 1

## 2020-01-06 NOTE — TOC Initial Note (Signed)
Transition of Care [facility]) - Initial/Assessment Note    Patient Details  Name: [name] MRN: [id number] Date of Birth: 01-19-1946  [facility] CM/SW Contact:    Emeterio Reeve, [place] Phone Number: 01/06/2020, 3:27 PM  Clinical Narrative:                  CSW met with pt at bedside. CSW introduced self and explained her role at the hospital. Pt stated that she was independent until January of this year. Since Jan. Fraser Din has between home, the hospital and [facility]. Pt gave CSW permission to call her brother BJ and relay the same information.  CSW reviewed pt/ot reccs for SNF. Pt states that she will not return to [facility]. Pt states she has used all of her snf days and she would have to use her medicaid and give up all but 36 dollars of her disability check. Pt states she can not do that because she has bills to pay at home. CSW confirmed this to be true with Olivia Mackie from [facility].   Pt stated she will like to return home with HHpt. Pt states she has a CNA though "Pleased to care for you" daily. Pt also says she lives with two roommates that will help her if needed. Pt has no preference in [facility] agencies.   Pts brother BJ stated that he agrees with his sisters decision. BJ stated that he believes its unrealistic to ask someone to give up their only source of income for snf. BJ stated that pt does have help at home but doesn't believe roommates are reliable but someone is always there.   CSW has informed MD and NCM of HH needs.   There are no other SW needs at this time.    Expected Discharge Plan: Skilled Nursing Facility Barriers to Discharge: Continued Medical Work up   Patient Goals and CMS Choice Patient states their goals for this hospitalization and ongoing recovery are:: To return home CMS Medicare.gov Compare Post Acute Care list provided to:: Patient Choice offered to / list presented to : Patient  Expected Discharge Plan and  Services Expected Discharge Plan: [place]       Living arrangements for the past 2 months: Single Family Home                                      Prior Living Arrangements/Services Living arrangements for the past 2 months: Single Family Home Lives with:: Roommate Patient language and need for interpreter reviewed:: Yes        Need for Family Participation in Patient Care: Yes (Comment) Care giver support system in place?: Yes (comment)   Criminal Activity/Legal Involvement Pertinent to Current Situation/Hospitalization: No - Comment as needed  Activities of Daily Living      Permission Sought/Granted   Permission granted to share information with : Yes, Verbal Permission Granted  Share Information with NAME: BJ Offenberger  Permission granted to share info w AGENCY: Home health agencies     Permission granted to share info w Contact Information: [phone]  Emotional Assessment Appearance:: Appears stated age Attitude/Demeanor/Rapport: Engaged Affect (typically observed): Appropriate Orientation: : Oriented to Self, Oriented to Place, Oriented to  Time, Oriented to Situation Alcohol / Substance Use: Not Applicable Psych Involvement: No (comment)  Admission diagnosis:  Pneumocephalus [G93.89] Abscess [L02.91] Fall [W19.XXXA] Benign neoplasm of  brain, unspecified brain region [facility]) [D33.2] Vasogenic edema ([place]) [G93.6] Postoperative infection, unspecified type, initial encounter [T81.40XA] Patient Active Problem List   Diagnosis Date Noted  . 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 HIV (human immunodeficiency virus infection) ([place]) 01/02/2020  . Brain mass 01/02/2020  . OSA (obstructive sleep apnea) 01/02/2020   PCP:  Nolene Ebbs, MD Pharmacy:  No Pharmacies Listed    Social Determinants of Health (SDOH) Interventions    Readmission Risk Interventions No flowsheet data found.  Emeterio Reeve, Latanya Presser, [place] Social  Worker [phone]

## 2020-01-06 NOTE — Discharge Summary (Signed)
Discharge Summary  [name] IRJ:[id number] DOB: July 24, 1945  PCP: Nolene Ebbs, MD  Admit date: 01/02/2020 Discharge date: 01/06/2020  Time spent: 40 mins  Recommendations for Outpatient Follow-up:  1. Follow-up with PCP in 1 week 2. Follow-up with neurosurgery in 1week    Discharge Diagnoses:  Active Hospital Problems   Diagnosis Date Noted  . 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 HIV (human immunodeficiency virus infection) ([facility]) 01/02/2020  . Brain mass 01/02/2020  . OSA (obstructive sleep apnea) 01/02/2020    Resolved Hospital Problems  No resolved problems to display.    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 [name] oncologist. Patient states she was on the wheelchair yesterday when she was trying to pick something she lost control and fell over and hit her head. Did not lose consciousness. In the ER patient had CT head C-spine chest abdomen pelvis which shows scalp contusion and also postsurgical changes at the site where patient had the surgery for the brain mass. There was concern for possible abscess at the site and on-call neurosurgeon Dr. Venetia Constable was consulted, blood cultures obtained and started on empiric antibiotics. Trauma surgery also was consulted. Labs show acute renal failure with creatinine of 1.6 creatinine was normal during last month. Potassium initially was 7.8 but  repeat was 3.6 CBC was unremarkable. Patient admitted for possible intracranial abscess for which empiric antibiotics have been started neurosurgery has been consulted and since patient is on apixaban reversal of the apixaban was requested by neurosurgery.    Today, patient denies any new complaints, denies any headaches, chest pain, shortness of breath, nausea/vomiting, fever/chills.  SNF was recommended for patient, but patient unable to afford SNF as she has used up all her Medicaid days.  Patient insisting to be discharged home.  Will discharge patient with home health PT/OT/aide.  Patient advised to follow-up with her PCP, and neurosurgery for further management.    Hospital Course:  Principal Problem:   Pneumocephalus Active Problems:   HIV (human immunodeficiency virus infection) ([facility])   Brain mass   OSA (obstructive sleep apnea)   Head trauma status post mechanical fall Brain abscess ruled out Noted scalp wound/dehisced surgical incision Currently afebrile, with no leukocytosis BC x2 NGTD Currently no concern for possible brain abscess, MRI was negative for any developing or evolving abscess, no hematoma noted Neurosurgery on board, no further intervention, follow-up as outpatient [place] consulted, appreciate recs for scalp wound care Continue p.o. doxycycline for a total of 7 days Follow-up with PCP  History of COPD Stable Continue inhalers, nebs as needed  History of embolic CVA with left-sided hemiplegia Stable Continue Eliquis  History of PE Stable Continue Eliquis as above  HIV Last CD4 count was 714 in June 2021 Continue Biktarvy  Brain mass positive for carcinoma Previous history of lung cancer Follows with Dr. [name] oncologist Ongoing radiation therapy-follow-up as scheduled (currently  held due to scalp wound)         Malnutrition Type:      Malnutrition Characteristics:      Nutrition Interventions:      Estimated body mass  index is 29.53 kg/m as calculated from the following:   Height as of this encounter: 5\' 9"  (1.753 m).   Weight as of this encounter: 90.7 kg.    Procedures:  None  Consultations:  Neurosurgery  Discharge Exam: BP (!) 95/58 (BP Location: Right Arm)   Pulse 75   Temp 98 F (36.7 C) (Oral)   Resp 16   Ht 5\' 9"  (1.753 m)   Wt 90.7 kg   SpO2 100%   BMI 29.53 kg/m   General: NAD Cardiovascular: S1, S2 present Respiratory: CTA B    Discharge Instructions You were cared for by a hospitalist during your hospital stay. If you have any questions about your discharge medications or the care you received while you were in the hospital after you are discharged, you can call the unit and asked to speak with the hospitalist on call if the hospitalist that took care of you is not available. Once you are discharged, your primary care physician will handle any further medical issues. Please note that NO REFILLS for any discharge medications will be authorized once you are discharged, as it is imperative that you return to your primary care physician (or establish a relationship with a primary care physician if you do not have one) for your aftercare needs so that they can reassess your need for medications and monitor your lab values.  Discharge Instructions    Diet - low sodium heart healthy   Complete by: As directed    Discharge wound care:   Complete by: As directed    As recommended by wound care   Increase activity slowly   Complete by: As directed      Allergies as of 01/06/2020   No Known Allergies     Medication List    TAKE these medications   albuterol (2.5 MG/3ML) 0.083% nebulizer solution Commonly known as: PROVENTIL Take 2.5 mg by nebulization every 4 (four) hours as needed for wheezing or shortness of breath.   albuterol 108 (90 Base) MCG/ACT inhaler Commonly known as: VENTOLIN HFA Inhale 1 puff into the lungs every 6 (six) hours as needed for shortness of  breath.   Biktarvy 50-200-25 MG Tabs tablet Generic drug: bictegravir-emtricitabine-tenofovir AF Take 1 tablet by mouth daily.   diclofenac Sodium 1 % Gel Commonly known as: VOLTAREN Apply 4 g topically 2 (two) times daily as needed for pain.   doxycycline 100 MG tablet Commonly known as: VIBRA-TABS Take 1 tablet (100 mg total) by mouth every 12 (twelve) hours for 11 doses.   DULoxetine 20 MG capsule Commonly known as: CYMBALTA Take 20 mg by mouth daily.   Eliquis 5 MG Tabs tablet Generic drug: apixaban Take 5 mg by mouth 2 (two) times daily.   Fluticasone-Salmeterol 500-50 MCG/DOSE Aepb Commonly known as: ADVAIR Inhale 1 puff into the lungs 2 (two) times daily.   gabapentin 800 MG tablet Commonly known as: NEURONTIN Take 800 mg by mouth 3 (three) times daily.   hydrOXYzine 25 MG tablet Commonly known as: ATARAX/VISTARIL Take 25 mg by mouth every 6 (six) hours.   loratadine 10 MG tablet Commonly known as: CLARITIN Take 10 mg by mouth daily.   montelukast 10 MG tablet Commonly known as: SINGULAIR Take 10 mg by mouth at bedtime.  polyethylene glycol 17 g packet Commonly known as: MIRALAX / GLYCOLAX Take 17 g by mouth daily as needed for mild constipation.   QUEtiapine Fumarate 150 MG 24 hr tablet Commonly known as: SEROQUEL XR Take 150 mg by mouth at bedtime.   rOPINIRole 1 MG tablet Commonly known as: REQUIP Take 1 mg by mouth at bedtime.   Spiriva Respimat 2.5 MCG/ACT Aers Generic drug: Tiotropium Bromide Monohydrate Inhale 2 puffs into the lungs daily.   tiZANidine 4 MG tablet Commonly known as: ZANAFLEX Take 4 mg by mouth 2 (two) times daily as needed for muscle spasms.   traMADol 50 MG tablet Commonly known as: ULTRAM Take 50 mg by mouth every 8 (eight) hours as needed (pain).   triamcinolone ointment 0.5 % Commonly known as: KENALOG Apply 1 application topically 2 (two) times daily.   venlafaxine XR 150 MG 24 hr capsule Commonly known as:  EFFEXOR-XR Take 150 mg by mouth daily.   vitamin C 100 MG tablet Take 100 mg by mouth daily.            Durable Medical Equipment  (From admission, onward)         Start     Ordered   01/06/20 1613  For home use only DME lightweight manual wheelchair with seat cushion  Once       Comments: Patient suffers from weakness which impairs their ability to perform daily activities like walking in the home.  A walker will not resolve  issue with performing activities of daily living. A wheelchair will allow patient to safely perform daily activities. Patient is not able to propel themselves in the home using a standard weight wheelchair due to weakness. Patient can self propel in the lightweight wheelchair. Length of need lifetime. Accessories: elevating leg rests (ELRs), wheel locks, extensions and anti-tippers.   01/06/20 1613           Discharge Care Instructions  (From admission, onward)         Start     Ordered   01/06/20 0000  Discharge wound care:       Comments: As recommended by wound care   01/06/20 1635         No Known Allergies  Follow-up Information    Nolene Ebbs, MD. Schedule an appointment as soon as possible for a visit in 1 week(s).   Specialty: Internal Medicine Contact information: [address] [place] [address] [phone]        [name], MD. Schedule an appointment as soon as possible for a visit in 1 week(s).   Specialty: Neurosurgery Contact information: [place] [address] [phone]                The results of significant diagnostics from this hospitalization (including imaging, microbiology, ancillary and laboratory) are listed below for reference.    Significant Diagnostic Studies: CT HEAD WO CONTRAST  Result Date: 01/02/2020 CLINICAL DATA:  Fall on blood thinners, left periorbital hematoma EXAM: CT HEAD WITHOUT CONTRAST CT CERVICAL SPINE WITHOUT CONTRAST CT CHEST, ABDOMEN AND  PELVIS WITH CONTRAST TECHNIQUE: Contiguous axial images were obtained from the base of the skull through the vertex without intravenous contrast. Multidetector CT imaging of the cervical spine was performed without intravenous contrast. Multiplanar CT image reconstructions were also generated. Multidetector CT imaging of the chest, abdomen and pelvis was performed following the standard protocol during bolus administration of intravenous contrast. CONTRAST:  184mL OMNIPAQUE IOHEXOL 300 MG/ML  SOLN COMPARISON:  Numerous priors, most recently head 12/26/2019, CT head 11/23/2019 CT chest, abdomen and pelvis 11/22/2019 FINDINGS: CT HEAD FINDINGS Brain: Extensive vasogenic edema throughout the right frontal and parietal lobes, minimally increased from recent MRI and CT. 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 There is an overlying air and fluid containing collection superficial to the craniotomy bone flap as well as additional mixed attenuation air and fluid extra-axial collection extending across the vertex and along the left falx. Hyperdensity along the adjacent sulci could reflect reactive dural thickening or hemorrhage. Some associated resulting mass effect along the right cerebral hemisphere with slight global sulcal effacement with features suggesting more diffuse volume loss elsewhere in the brain. No significant midline shift or transtentorial migration. Basal cisterns are patent. Vascular: Atherosclerotic calcification of the carotid siphons. No hyperdense vessel. Skull: Postsurgical changes at the vertex from recent craniotomy as above with overlying and subjacent swelling and hematoma. Additional site of left frontal scalp swelling likely related to the acute trauma is noted with a small crescentic scalp hematoma measuring up to 5 mm in maximal thickness. No subjacent calvarial fracture. Other  scalp hematoma. Chronic deformities of the nasal bones are similar to prior. Sinuses/Orbits: Paranasal sinuses and mastoid air cells are predominantly clear. No retro septal gas, stranding or hemorrhage. Included orbital structures are otherwise unremarkable. Other: None CT CERVICAL SPINE FINDINGS Alignment: Stabilization collar in place. Straightening of the normal cervical lordosis. Mild rightward cranial rotation. No evidence of traumatic listhesis. No abnormally widened, perched or jumped facets. Normal alignment of the craniocervical and atlantoaxial articulations accounting for degree of cranial rotation. Skull base and vertebrae: No visible skull base fracture. No vertebral body fracture or height loss. Posterior elements are intact. Multilevel spondylitic changes, uncinate spurring and facet degenerative change, detailed below. No acute or worrisome osseous lesion. Soft tissues and spinal canal: No pre or paravertebral fluid or swelling. No visible canal hematoma. Disc levels: Multilevel intervertebral disc height loss with cervical spondylitic changes maximal C5-C7. Posterior disc osteophyte complexes are present resulting at most maximal mild canal stenosis. Multilevel uncinate spurring and facet hypertrophic changes result in mild-to-moderate multilevel neural foraminal narrowing as well. No severe canal stenosis or foraminal impingement. Other: Cervical carotid atherosclerosis. CT CHEST FINDINGS Cardiovascular: The aortic root is suboptimally assessed given cardiac pulsation artifact. Calcifications of the aortic leaflets. Borderline dilated ascending thoracic aorta at 4 cm. Proximal dilatation of the descending thoracic aorta is similar to prior measuring up to 3.3 cm in diameter and 3.5 cm at the level of the diaphragm. No acute luminal abnormality. No periaortic stranding or hemorrhage. Normal 3 vessel branching of the aortic arch. Minimal plaque in the proximal great vessels without other significant  luminal abnormality or acute finding. Central pulmonary arteries are borderline enlarged. No large central or lobar filling defects on this non tailored examination of the pulmonary arteries. Normal heart size. Coronary artery calcifications are present. No pericardial effusion. Mediastinum/Nodes: No mediastinal fluid or gas. Normal thyroid gland and thoracic inlet. No acute abnormality of the trachea or esophagus. No worrisome mediastinal, hilar or axillary adenopathy. Lungs/Pleura: No acute traumatic parenchymal injury. No pneumothorax or effusion. Dependent atelectatic changes. Stable multifocal areas of bandlike opacity in the mid to lower lungs compatible with areas of cicatricial or atelectatic change. No focal consolidation or features of edema. Mild bronchitic airways thickening. 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  No acute rib fractures. No acute osseous injury of the chest wall elsewhere nor in the thoracic spine or included shoulders. No large body wall hematoma or other acute traumatic injury of the soft tissues. CT ABDOMEN PELVIS FINDINGS Hepatobiliary: No direct hepatic injury or perihepatic hematoma. No worrisome focal liver abnormality is seen. Normal gallbladder. No visible calcified gallstones. No biliary ductal dilatation. Pancreas: No pancreatic contusive change or ductal disruption. No peripancreatic inflammation or discernible lesions. Spleen: Normal splenic size. Stable hypoattenuating cleft at the superior spleen. No direct splenic injury or perisplenic hematoma. Adrenals/Urinary Tract: Normal adrenal glands without evidence of hemorrhage. No direct renal injury or perinephric hemorrhage. Slight non rotation of the left kidney, benign anatomic variant. No concerning renal mass, urolithiasis or hydronephrosis. Urinary bladder is unremarkable. Stomach/Bowel: Distal esophagus, stomach and duodenum are free of acute abnormality. A normal  appendix is visualized. Moderate colonic stool burden without proximal thickening or distension. Scattered colonic diverticula without focal inflammation to suggest diverticulitis. Some mild rectal wall thickening with large inspissated stool ball measuring up to 7.9 cm in diameter with associated faint perirectal stranding. Vascular/Lymphatic: No acute vascular injury or contrast extravasation in the abdomen or pelvis. Atherosclerotic calcifications within the abdominal aorta and branch vessels. No aneurysm or ectasia. No enlarged abdominopelvic lymph nodes. Reproductive: Anteverted uterus with calcified subserosal fibroid. No concerning adnexal lesions. Other: No large body wall hematoma or retroperitoneal hemorrhage. No traumatic abdominal wall dehiscence. No abdominopelvic free air or fluid. Perirectal stranding, as above. Musculoskeletal: Sclerotic bands along the medial ilia may ring the SI joint may reflect sequela of remote pelvic insufficiency fracture, stable from priors. No acute fracture or traumatic osseous injury of the lumbar spine, bony pelvis or proximal femora. Multilevel discogenic and facet degenerative changes in the lumbar spine, maximal L5-S1. Additional degenerative changes in the hips and pelvis. IMPRESSION: CT HEAD 1. Left frontal scalp contusion without subjacent calvarial fracture. 2. Postsurgical changes at the vertex from recent craniotomy at the site of meningioma resection with residual hyperdense tumor along the resection margin adjacent the superior sagittal sinus. There is an overlying air and fluid containing collection superficial to the craniotomy bone flap as well as additional mixed attenuation extra-axial fluid extending across the vertex and left falx. 3. Mild sulcal effacement throughout the cerebral hemisphere without significant midline shift. 4. Slight interval increase in the degree of vasogenic edema within the right frontal and parietal lobes. 5. No other sites of  hyperdense hemorrhage or other acute intracranial abnormality. 6. Background mild parenchymal volume loss and microvascular angiopathy. CT CERVICAL SPINE 1. No acute fracture or traumatic osseous injury of the cervical spine. 2. Multilevel cervical spondylitic changes as detailed above. 3. Cervical carotid atherosclerosis. CT CHEST, ABDOMEN AND PELVIS 1. No acute traumatic injury to the chest. 2. No acute traumatic injury to the abdomen or pelvis. 3. Some mild rectal wall thickening with large inspissated stool ball measuring up to 7.9 cm in diameter with associated faint perirectal stranding. Correlate for signs and symptoms of stercoral colitis. 4. Colonic diverticulosis without evidence of acute diverticulitis. 5. Fibroid uterus. 6.  Emphysema (ICD10-J43.9).  Mild bronchitic changes as well. 7. Aortic Atherosclerosis (ICD10-I70.0). 8. 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 Recommend annual imaging followup by CTA or MRA. This recommendation follows 2010 ACCF/AHA/AATS/ACR/ASA/SCA/SCAI/SIR/STS/SVM Guidelines for the Diagnosis and Management of Patients with Thoracic Aortic Disease. Circulation. 2010; 121: Q676-P950. Aortic aneurysm NOS (ICD10-I71.9) Critical Value/emergent results were called by telephone at the time of interpretation on 01/02/2020 at 2:47  am to provider Dr. Kieth Brightly , who verbally acknowledged these results. Electronically Signed   By: Lovena Le M.D.   On: 01/02/2020 02:47   CT CHEST W CONTRAST  Result Date: 01/02/2020 CLINICAL DATA:  Fall on blood thinners, left periorbital hematoma EXAM: CT HEAD WITHOUT CONTRAST CT CERVICAL SPINE WITHOUT CONTRAST CT CHEST, ABDOMEN AND PELVIS WITH CONTRAST TECHNIQUE: Contiguous axial images were obtained from the base of the skull through the vertex without intravenous contrast. Multidetector CT imaging of the cervical spine was performed without intravenous contrast. Multiplanar CT image reconstructions were also generated. Multidetector  CT imaging of the chest, abdomen and pelvis was performed following the standard protocol during bolus administration of intravenous contrast. CONTRAST:  127mL OMNIPAQUE IOHEXOL 300 MG/ML  SOLN COMPARISON:  Numerous priors, most recently head 12/26/2019, CT head 11/23/2019 CT chest, abdomen and pelvis 11/22/2019 FINDINGS: CT HEAD FINDINGS Brain: Extensive vasogenic edema throughout the right frontal and parietal lobes, minimally increased from recent MRI and CT. 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 There is an overlying air and fluid containing collection superficial to the craniotomy bone flap as well as additional mixed attenuation air and fluid extra-axial collection extending across the vertex and along the left falx. Hyperdensity along the adjacent sulci could reflect reactive dural thickening or hemorrhage. Some associated resulting mass effect along the right cerebral hemisphere with slight global sulcal effacement with features suggesting more diffuse volume loss elsewhere in the brain. No significant midline shift or transtentorial migration. Basal cisterns are patent. Vascular: Atherosclerotic calcification of the carotid siphons. No hyperdense vessel. Skull: Postsurgical changes at the vertex from recent craniotomy as above with overlying and subjacent swelling and hematoma. Additional site of left frontal scalp swelling likely related to the acute trauma is noted with a small crescentic scalp hematoma measuring up to 5 mm in maximal thickness. No subjacent calvarial fracture. Other scalp hematoma. Chronic deformities of the nasal bones are similar to prior. Sinuses/Orbits: Paranasal sinuses and mastoid air cells are predominantly clear. No retro septal gas, stranding or hemorrhage. Included orbital structures are otherwise unremarkable. Other: None CT CERVICAL SPINE FINDINGS Alignment:  Stabilization collar in place. Straightening of the normal cervical lordosis. Mild rightward cranial rotation. No evidence of traumatic listhesis. No abnormally widened, perched or jumped facets. Normal alignment of the craniocervical and atlantoaxial articulations accounting for degree of cranial rotation. Skull base and vertebrae: No visible skull base fracture. No vertebral body fracture or height loss. Posterior elements are intact. Multilevel spondylitic changes, uncinate spurring and facet degenerative change, detailed below. No acute or worrisome osseous lesion. Soft tissues and spinal canal: No pre or paravertebral fluid or swelling. No visible canal hematoma. Disc levels: Multilevel intervertebral disc height loss with cervical spondylitic changes maximal C5-C7. Posterior disc osteophyte complexes are present resulting at most maximal mild canal stenosis. Multilevel uncinate spurring and facet hypertrophic changes result in mild-to-moderate multilevel neural foraminal narrowing as well. No severe canal stenosis or foraminal impingement. Other: Cervical carotid atherosclerosis. CT CHEST FINDINGS Cardiovascular: The aortic root is suboptimally assessed given cardiac pulsation artifact. Calcifications of the aortic leaflets. Borderline dilated ascending thoracic aorta at 4 cm. Proximal dilatation of the descending thoracic aorta is similar to prior measuring up to 3.3 cm in diameter and 3.5 cm at the level of the diaphragm. No acute luminal abnormality. No periaortic stranding or hemorrhage. Normal 3 vessel branching of the aortic arch. Minimal plaque in  the proximal great vessels without other significant luminal abnormality or acute finding. Central pulmonary arteries are borderline enlarged. No large central or lobar filling defects on this non tailored examination of the pulmonary arteries. Normal heart size. Coronary artery calcifications are present. No pericardial effusion. Mediastinum/Nodes: No  mediastinal fluid or gas. Normal thyroid gland and thoracic inlet. No acute abnormality of the trachea or esophagus. No worrisome mediastinal, hilar or axillary adenopathy. Lungs/Pleura: No acute traumatic parenchymal injury. No pneumothorax or effusion. Dependent atelectatic changes. Stable multifocal areas of bandlike opacity in the mid to lower lungs compatible with areas of cicatricial or atelectatic change. No focal consolidation or features of edema. Mild bronchitic airways thickening. 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 No acute rib fractures. No acute osseous injury of the chest wall elsewhere nor in the thoracic spine or included shoulders. No large body wall hematoma or other acute traumatic injury of the soft tissues. CT ABDOMEN PELVIS FINDINGS Hepatobiliary: No direct hepatic injury or perihepatic hematoma. No worrisome focal liver abnormality is seen. Normal gallbladder. No visible calcified gallstones. No biliary ductal dilatation. Pancreas: No pancreatic contusive change or ductal disruption. No peripancreatic inflammation or discernible lesions. Spleen: Normal splenic size. Stable hypoattenuating cleft at the superior spleen. No direct splenic injury or perisplenic hematoma. Adrenals/Urinary Tract: Normal adrenal glands without evidence of hemorrhage. No direct renal injury or perinephric hemorrhage. Slight non rotation of the left kidney, benign anatomic variant. No concerning renal mass, urolithiasis or hydronephrosis. Urinary bladder is unremarkable. Stomach/Bowel: Distal esophagus, stomach and duodenum are free of acute abnormality. A normal appendix is visualized. Moderate colonic stool burden without proximal thickening or distension. Scattered colonic diverticula without focal inflammation to suggest diverticulitis. Some mild rectal wall thickening with large inspissated stool ball measuring up to 7.9 cm in diameter with associated faint  perirectal stranding. Vascular/Lymphatic: No acute vascular injury or contrast extravasation in the abdomen or pelvis. Atherosclerotic calcifications within the abdominal aorta and branch vessels. No aneurysm or ectasia. No enlarged abdominopelvic lymph nodes. Reproductive: Anteverted uterus with calcified subserosal fibroid. No concerning adnexal lesions. Other: No large body wall hematoma or retroperitoneal hemorrhage. No traumatic abdominal wall dehiscence. No abdominopelvic free air or fluid. Perirectal stranding, as above. Musculoskeletal: Sclerotic bands along the medial ilia may ring the SI joint may reflect sequela of remote pelvic insufficiency fracture, stable from priors. No acute fracture or traumatic osseous injury of the lumbar spine, bony pelvis or proximal femora. Multilevel discogenic and facet degenerative changes in the lumbar spine, maximal L5-S1. Additional degenerative changes in the hips and pelvis. IMPRESSION: CT HEAD 1. Left frontal scalp contusion without subjacent calvarial fracture. 2. Postsurgical changes at the vertex from recent craniotomy at the site of meningioma resection with residual hyperdense tumor along the resection margin adjacent the superior sagittal sinus. There is an overlying air and fluid containing collection superficial to the craniotomy bone flap as well as additional mixed attenuation extra-axial fluid extending across the vertex and left falx. 3. Mild sulcal effacement throughout the cerebral hemisphere without significant midline shift. 4. Slight interval increase in the degree of vasogenic edema within the right frontal and parietal lobes. 5. No other sites of hyperdense hemorrhage or other acute intracranial abnormality. 6. Background mild parenchymal volume loss and microvascular angiopathy. CT CERVICAL SPINE 1. No acute fracture or traumatic osseous injury of the cervical spine. 2. Multilevel cervical spondylitic changes as detailed above. 3. Cervical carotid  atherosclerosis. CT CHEST, ABDOMEN AND PELVIS 1. No acute  traumatic injury to the chest. 2. No acute traumatic injury to the abdomen or pelvis. 3. Some mild rectal wall thickening with large inspissated stool ball measuring up to 7.9 cm in diameter with associated faint perirectal stranding. Correlate for signs and symptoms of stercoral colitis. 4. Colonic diverticulosis without evidence of acute diverticulitis. 5. Fibroid uterus. 6.  Emphysema (ICD10-J43.9).  Mild bronchitic changes as well. 7. Aortic Atherosclerosis (ICD10-I70.0). 8. 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 Recommend annual imaging followup by CTA or MRA. This recommendation follows 2010 ACCF/AHA/AATS/ACR/ASA/SCA/SCAI/SIR/STS/SVM Guidelines for the Diagnosis and Management of Patients with Thoracic Aortic Disease. Circulation. 2010; 121: H062-B762. Aortic aneurysm NOS (ICD10-I71.9) Critical Value/emergent results were called by telephone at the time of interpretation on 01/02/2020 at 2:47 am to provider Dr. Kieth Brightly , who verbally acknowledged these results. Electronically Signed   By: Lovena Le M.D.   On: 01/02/2020 02:47   CT CERVICAL SPINE WO CONTRAST  Result Date: 01/02/2020 CLINICAL DATA:  Fall on blood thinners, left periorbital hematoma EXAM: CT HEAD WITHOUT CONTRAST CT CERVICAL SPINE WITHOUT CONTRAST CT CHEST, ABDOMEN AND PELVIS WITH CONTRAST TECHNIQUE: Contiguous axial images were obtained from the base of the skull through the vertex without intravenous contrast. Multidetector CT imaging of the cervical spine was performed without intravenous contrast. Multiplanar CT image reconstructions were also generated. Multidetector CT imaging of the chest, abdomen and pelvis was performed following the standard protocol during bolus administration of intravenous contrast. CONTRAST:  19mL OMNIPAQUE IOHEXOL 300 MG/ML  SOLN COMPARISON:  Numerous priors, most recently head 12/26/2019, CT head 11/23/2019 CT chest, abdomen and  pelvis 11/22/2019 FINDINGS: CT HEAD FINDINGS Brain: Extensive vasogenic edema throughout the right frontal and parietal lobes, minimally increased from recent MRI and CT. 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 There is an overlying air and fluid containing collection superficial to the craniotomy bone flap as well as additional mixed attenuation air and fluid extra-axial collection extending across the vertex and along the left falx. Hyperdensity along the adjacent sulci could reflect reactive dural thickening or hemorrhage. Some associated resulting mass effect along the right cerebral hemisphere with slight global sulcal effacement with features suggesting more diffuse volume loss elsewhere in the brain. No significant midline shift or transtentorial migration. Basal cisterns are patent. Vascular: Atherosclerotic calcification of the carotid siphons. No hyperdense vessel. Skull: Postsurgical changes at the vertex from recent craniotomy as above with overlying and subjacent swelling and hematoma. Additional site of left frontal scalp swelling likely related to the acute trauma is noted with a small crescentic scalp hematoma measuring up to 5 mm in maximal thickness. No subjacent calvarial fracture. Other scalp hematoma. Chronic deformities of the nasal bones are similar to prior. Sinuses/Orbits: Paranasal sinuses and mastoid air cells are predominantly clear. No retro septal gas, stranding or hemorrhage. Included orbital structures are otherwise unremarkable. Other: None CT CERVICAL SPINE FINDINGS Alignment: Stabilization collar in place. Straightening of the normal cervical lordosis. Mild rightward cranial rotation. No evidence of traumatic listhesis. No abnormally widened, perched or jumped facets. Normal alignment of the craniocervical and atlantoaxial articulations accounting for degree of  cranial rotation. Skull base and vertebrae: No visible skull base fracture. No vertebral body fracture or height loss. Posterior elements are intact. Multilevel spondylitic changes, uncinate spurring and facet degenerative change, detailed below. No acute or worrisome osseous lesion. Soft tissues and spinal canal: No pre or paravertebral fluid or swelling. No  visible canal hematoma. Disc levels: Multilevel intervertebral disc height loss with cervical spondylitic changes maximal C5-C7. Posterior disc osteophyte complexes are present resulting at most maximal mild canal stenosis. Multilevel uncinate spurring and facet hypertrophic changes result in mild-to-moderate multilevel neural foraminal narrowing as well. No severe canal stenosis or foraminal impingement. Other: Cervical carotid atherosclerosis. CT CHEST FINDINGS Cardiovascular: The aortic root is suboptimally assessed given cardiac pulsation artifact. Calcifications of the aortic leaflets. Borderline dilated ascending thoracic aorta at 4 cm. Proximal dilatation of the descending thoracic aorta is similar to prior measuring up to 3.3 cm in diameter and 3.5 cm at the level of the diaphragm. No acute luminal abnormality. No periaortic stranding or hemorrhage. Normal 3 vessel branching of the aortic arch. Minimal plaque in the proximal great vessels without other significant luminal abnormality or acute finding. Central pulmonary arteries are borderline enlarged. No large central or lobar filling defects on this non tailored examination of the pulmonary arteries. Normal heart size. Coronary artery calcifications are present. No pericardial effusion. Mediastinum/Nodes: No mediastinal fluid or gas. Normal thyroid gland and thoracic inlet. No acute abnormality of the trachea or esophagus. No worrisome mediastinal, hilar or axillary adenopathy. Lungs/Pleura: No acute traumatic parenchymal injury. No pneumothorax or effusion. Dependent atelectatic changes. Stable  multifocal areas of bandlike opacity in the mid to lower lungs compatible with areas of cicatricial or atelectatic change. No focal consolidation or features of edema. Mild bronchitic airways thickening. 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 No acute rib fractures. No acute osseous injury of the chest wall elsewhere nor in the thoracic spine or included shoulders. No large body wall hematoma or other acute traumatic injury of the soft tissues. CT ABDOMEN PELVIS FINDINGS Hepatobiliary: No direct hepatic injury or perihepatic hematoma. No worrisome focal liver abnormality is seen. Normal gallbladder. No visible calcified gallstones. No biliary ductal dilatation. Pancreas: No pancreatic contusive change or ductal disruption. No peripancreatic inflammation or discernible lesions. Spleen: Normal splenic size. Stable hypoattenuating cleft at the superior spleen. No direct splenic injury or perisplenic hematoma. Adrenals/Urinary Tract: Normal adrenal glands without evidence of hemorrhage. No direct renal injury or perinephric hemorrhage. Slight non rotation of the left kidney, benign anatomic variant. No concerning renal mass, urolithiasis or hydronephrosis. Urinary bladder is unremarkable. Stomach/Bowel: Distal esophagus, stomach and duodenum are free of acute abnormality. A normal appendix is visualized. Moderate colonic stool burden without proximal thickening or distension. Scattered colonic diverticula without focal inflammation to suggest diverticulitis. Some mild rectal wall thickening with large inspissated stool ball measuring up to 7.9 cm in diameter with associated faint perirectal stranding. Vascular/Lymphatic: No acute vascular injury or contrast extravasation in the abdomen or pelvis. Atherosclerotic calcifications within the abdominal aorta and branch vessels. No aneurysm or ectasia. No enlarged abdominopelvic lymph nodes. Reproductive: Anteverted uterus with  calcified subserosal fibroid. No concerning adnexal lesions. Other: No large body wall hematoma or retroperitoneal hemorrhage. No traumatic abdominal wall dehiscence. No abdominopelvic free air or fluid. Perirectal stranding, as above. Musculoskeletal: Sclerotic bands along the medial ilia may ring the SI joint may reflect sequela of remote pelvic insufficiency fracture, stable from priors. No acute fracture or traumatic osseous injury of the lumbar spine, bony pelvis or proximal femora. Multilevel discogenic and facet degenerative changes in the lumbar spine, maximal L5-S1. Additional degenerative changes in the hips and pelvis. IMPRESSION: CT HEAD 1. Left frontal scalp contusion without subjacent calvarial fracture. 2. Postsurgical changes at the vertex from recent craniotomy at the site of meningioma  resection with residual hyperdense tumor along the resection margin adjacent the superior sagittal sinus. There is an overlying air and fluid containing collection superficial to the craniotomy bone flap as well as additional mixed attenuation extra-axial fluid extending across the vertex and left falx. 3. Mild sulcal effacement throughout the cerebral hemisphere without significant midline shift. 4. Slight interval increase in the degree of vasogenic edema within the right frontal and parietal lobes. 5. No other sites of hyperdense hemorrhage or other acute intracranial abnormality. 6. Background mild parenchymal volume loss and microvascular angiopathy. CT CERVICAL SPINE 1. No acute fracture or traumatic osseous injury of the cervical spine. 2. Multilevel cervical spondylitic changes as detailed above. 3. Cervical carotid atherosclerosis. CT CHEST, ABDOMEN AND PELVIS 1. No acute traumatic injury to the chest. 2. No acute traumatic injury to the abdomen or pelvis. 3. Some mild rectal wall thickening with large inspissated stool ball measuring up to 7.9 cm in diameter with associated faint perirectal stranding.  Correlate for signs and symptoms of stercoral colitis. 4. Colonic diverticulosis without evidence of acute diverticulitis. 5. Fibroid uterus. 6.  Emphysema (ICD10-J43.9).  Mild bronchitic changes as well. 7. Aortic Atherosclerosis (ICD10-I70.0). 8. 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 Recommend annual imaging followup by CTA or MRA. This recommendation follows 2010 ACCF/AHA/AATS/ACR/ASA/SCA/SCAI/SIR/STS/SVM Guidelines for the Diagnosis and Management of Patients with Thoracic Aortic Disease. Circulation. 2010; 121: [id number]. Aortic aneurysm NOS (ICD10-I71.9) Critical Value/emergent results were called by telephone at the time of interpretation on 01/02/2020 at 2:47 am to provider Dr. Kieth Brightly , who verbally acknowledged these results. Electronically Signed   By: Lovena Le M.D.   On: 01/02/2020 02:47   MR Brain W and Wo Contrast  Result Date: 01/02/2020 CLINICAL DATA:  Recent tumor resection. Fall. Possible operative site infection. EXAM: MRI HEAD WITHOUT AND WITH CONTRAST TECHNIQUE: Multiplanar, multiecho pulse sequences of the brain and surrounding structures were obtained without and with intravenous contrast. CONTRAST:  23mL GADAVIST GADOBUTROL 1 MMOL/ML IV SOLN, COMPARISON:  12/26/2019 FINDINGS: Brain: Redemonstration of resection cavity in the superior right hemisphere. Contrast enhancement along the resection margin is unchanged to slightly decreased. There is persistent diffusion restriction of the contents of the resection cavity, unchanged compared to the prior exam. Area nodular, intermediate contrast enhancement at the anterior resection margin is unchanged, measuring 2.0 x 3.8 cm. There is increased hyperintense T2-weighted signal throughout the right frontal and parietal white matter. Vascular: Normal flow voids. Skull and upper cervical spine: Recent right-sided craniotomy. Worsened edema of the scalp with increased subgaleal fluid. Sinuses/Orbits: Negative. Other: None.  IMPRESSION: 1. Unchanged to slightly decreased contrast enhancement along the resection margin of the superior right hemisphere, with persistent diffusion restriction of the resection cavity contents. No change to suggest developing or evolving abscess. 2. Unchanged nodular, intermediate contrast enhancement at the anterior resection margin, consistent with recurrent tumor. 3. Increased edema throughout the right frontal and parietal white matter. 4. Worsened scalp edema with increased subgaleal fluid, most likely secondary to recent trauma. Electronically Signed   By: Ulyses Jarred M.D.   On: 01/02/2020 20:10   CT ABDOMEN PELVIS W CONTRAST  Result Date: 01/02/2020 CLINICAL DATA:  Fall on blood thinners, left periorbital hematoma EXAM: CT HEAD WITHOUT CONTRAST CT CERVICAL SPINE WITHOUT CONTRAST CT CHEST, ABDOMEN AND PELVIS WITH CONTRAST TECHNIQUE: Contiguous axial images were obtained from the base of the skull through the vertex without intravenous contrast. Multidetector CT imaging of the cervical spine was performed without intravenous contrast. Multiplanar CT image  reconstructions were also generated. Multidetector CT imaging of the chest, abdomen and pelvis was performed following the standard protocol during bolus administration of intravenous contrast. CONTRAST:  166mL OMNIPAQUE IOHEXOL 300 MG/ML  SOLN COMPARISON:  Numerous priors, most recently head 12/26/2019, CT head 11/23/2019 CT chest, abdomen and pelvis 11/22/2019 FINDINGS: CT HEAD FINDINGS Brain: Extensive vasogenic edema throughout the right frontal and parietal lobes, minimally increased from recent MRI and CT. 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 There is an overlying air and fluid containing collection superficial to the craniotomy bone flap as well as additional mixed attenuation air and fluid extra-axial collection extending  across the vertex and along the left falx. Hyperdensity along the adjacent sulci could reflect reactive dural thickening or hemorrhage. Some associated resulting mass effect along the right cerebral hemisphere with slight global sulcal effacement with features suggesting more diffuse volume loss elsewhere in the brain. No significant midline shift or transtentorial migration. Basal cisterns are patent. Vascular: Atherosclerotic calcification of the carotid siphons. No hyperdense vessel. Skull: Postsurgical changes at the vertex from recent craniotomy as above with overlying and subjacent swelling and hematoma. Additional site of left frontal scalp swelling likely related to the acute trauma is noted with a small crescentic scalp hematoma measuring up to 5 mm in maximal thickness. No subjacent calvarial fracture. Other scalp hematoma. Chronic deformities of the nasal bones are similar to prior. Sinuses/Orbits: Paranasal sinuses and mastoid air cells are predominantly clear. No retro septal gas, stranding or hemorrhage. Included orbital structures are otherwise unremarkable. Other: None CT CERVICAL SPINE FINDINGS Alignment: Stabilization collar in place. Straightening of the normal cervical lordosis. Mild rightward cranial rotation. No evidence of traumatic listhesis. No abnormally widened, perched or jumped facets. Normal alignment of the craniocervical and atlantoaxial articulations accounting for degree of cranial rotation. Skull base and vertebrae: No visible skull base fracture. No vertebral body fracture or height loss. Posterior elements are intact. Multilevel spondylitic changes, uncinate spurring and facet degenerative change, detailed below. No acute or worrisome osseous lesion. Soft tissues and spinal canal: No pre or paravertebral fluid or swelling. No visible canal hematoma. Disc levels: Multilevel intervertebral disc height loss with cervical spondylitic changes maximal C5-C7. Posterior disc osteophyte  complexes are present resulting at most maximal mild canal stenosis. Multilevel uncinate spurring and facet hypertrophic changes result in mild-to-moderate multilevel neural foraminal narrowing as well. No severe canal stenosis or foraminal impingement. Other: Cervical carotid atherosclerosis. CT CHEST FINDINGS Cardiovascular: The aortic root is suboptimally assessed given cardiac pulsation artifact. Calcifications of the aortic leaflets. Borderline dilated ascending thoracic aorta at 4 cm. Proximal dilatation of the descending thoracic aorta is similar to prior measuring up to 3.3 cm in diameter and 3.5 cm at the level of the diaphragm. No acute luminal abnormality. No periaortic stranding or hemorrhage. Normal 3 vessel branching of the aortic arch. Minimal plaque in the proximal great vessels without other significant luminal abnormality or acute finding. Central pulmonary arteries are borderline enlarged. No large central or lobar filling defects on this non tailored examination of the pulmonary arteries. Normal heart size. Coronary artery calcifications are present. No pericardial effusion. Mediastinum/Nodes: No mediastinal fluid or gas. Normal thyroid gland and thoracic inlet. No acute abnormality of the trachea or esophagus. No worrisome mediastinal, hilar or axillary adenopathy. Lungs/Pleura: No acute traumatic parenchymal injury. No pneumothorax or effusion. Dependent atelectatic changes. Stable multifocal areas of bandlike opacity in the mid to  lower lungs compatible with areas of cicatricial or atelectatic change. No focal consolidation or features of edema. Mild bronchitic airways thickening. 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 No acute rib fractures. No acute osseous injury of the chest wall elsewhere nor in the thoracic spine or included shoulders. No large body wall hematoma or other acute traumatic injury of the soft tissues. CT ABDOMEN PELVIS  FINDINGS Hepatobiliary: No direct hepatic injury or perihepatic hematoma. No worrisome focal liver abnormality is seen. Normal gallbladder. No visible calcified gallstones. No biliary ductal dilatation. Pancreas: No pancreatic contusive change or ductal disruption. No peripancreatic inflammation or discernible lesions. Spleen: Normal splenic size. Stable hypoattenuating cleft at the superior spleen. No direct splenic injury or perisplenic hematoma. Adrenals/Urinary Tract: Normal adrenal glands without evidence of hemorrhage. No direct renal injury or perinephric hemorrhage. Slight non rotation of the left kidney, benign anatomic variant. No concerning renal mass, urolithiasis or hydronephrosis. Urinary bladder is unremarkable. Stomach/Bowel: Distal esophagus, stomach and duodenum are free of acute abnormality. A normal appendix is visualized. Moderate colonic stool burden without proximal thickening or distension. Scattered colonic diverticula without focal inflammation to suggest diverticulitis. Some mild rectal wall thickening with large inspissated stool ball measuring up to 7.9 cm in diameter with associated faint perirectal stranding. Vascular/Lymphatic: No acute vascular injury or contrast extravasation in the abdomen or pelvis. Atherosclerotic calcifications within the abdominal aorta and branch vessels. No aneurysm or ectasia. No enlarged abdominopelvic lymph nodes. Reproductive: Anteverted uterus with calcified subserosal fibroid. No concerning adnexal lesions. Other: No large body wall hematoma or retroperitoneal hemorrhage. No traumatic abdominal wall dehiscence. No abdominopelvic free air or fluid. Perirectal stranding, as above. Musculoskeletal: Sclerotic bands along the medial ilia may ring the SI joint may reflect sequela of remote pelvic insufficiency fracture, stable from priors. No acute fracture or traumatic osseous injury of the lumbar spine, bony pelvis or proximal femora. Multilevel discogenic  and facet degenerative changes in the lumbar spine, maximal L5-S1. Additional degenerative changes in the hips and pelvis. IMPRESSION: CT HEAD 1. Left frontal scalp contusion without subjacent calvarial fracture. 2. Postsurgical changes at the vertex from recent craniotomy at the site of meningioma resection with residual hyperdense tumor along the resection margin adjacent the superior sagittal sinus. There is an overlying air and fluid containing collection superficial to the craniotomy bone flap as well as additional mixed attenuation extra-axial fluid extending across the vertex and left falx. 3. Mild sulcal effacement throughout the cerebral hemisphere without significant midline shift. 4. Slight interval increase in the degree of vasogenic edema within the right frontal and parietal lobes. 5. No other sites of hyperdense hemorrhage or other acute intracranial abnormality. 6. Background mild parenchymal volume loss and microvascular angiopathy. CT CERVICAL SPINE 1. No acute fracture or traumatic osseous injury of the cervical spine. 2. Multilevel cervical spondylitic changes as detailed above. 3. Cervical carotid atherosclerosis. CT CHEST, ABDOMEN AND PELVIS 1. No acute traumatic injury to the chest. 2. No acute traumatic injury to the abdomen or pelvis. 3. Some mild rectal wall thickening with large inspissated stool ball measuring up to 7.9 cm in diameter with associated faint perirectal stranding. Correlate for signs and symptoms of stercoral colitis. 4. Colonic diverticulosis without evidence of acute diverticulitis. 5. Fibroid uterus. 6.  Emphysema (ICD10-J43.9).  Mild bronchitic changes as well. 7. Aortic Atherosclerosis (ICD10-I70.0). 8. 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 Recommend annual imaging followup by CTA or MRA. This recommendation follows 2010 ACCF/AHA/AATS/ACR/ASA/SCA/SCAI/SIR/STS/SVM Guidelines for the  Diagnosis and Management of Patients with Thoracic Aortic Disease.  Circulation. 2010; 121: H852-D782. Aortic aneurysm NOS (ICD10-I71.9) Critical Value/emergent results were called by telephone at the time of interpretation on 01/02/2020 at 2:47 am to provider Dr. Kieth Brightly , who verbally acknowledged these results. Electronically Signed   By: Lovena Le M.D.   On: 01/02/2020 02:47   DG Pelvis Portable  Result Date: 01/02/2020 CLINICAL DATA:  Level 1 trauma, fall blood thinners, forehead hematoma, hypotensive EXAM: PORTABLE PELVIS 1-2 VIEWS COMPARISON:  Radiograph 11/18/2019 FINDINGS: The bones of the pelvis appear intact and congruent. Evaluation the sacrum is limited due to overlying bowel gas. No clear disruption of the sacral arcs is seen. Proximal femora are normally located and intact as well. Additional degenerative changes noted throughout the lower lumbar spine, hips and pelvis. Telemetry leads overlie the pelvis. IMPRESSION: 1. No acute osseous abnormality. 2. Mild to moderate degenerative changes in the hips and pelvis. Electronically Signed   By: Lovena Le M.D.   On: 01/02/2020 01:51   DG Chest Port 1 View  Result Date: 01/02/2020 CLINICAL DATA:  Initial evaluation for acute trauma, fall. EXAM: PORTABLE CHEST 1 VIEW COMPARISON:  Prior radiograph from 11/21/2019. FINDINGS: Cardiomegaly, stable.  Mediastinal silhouette within normal limits. Lungs mildly hypoinflated. Streaky opacity involving the mid left lung again noted, stable from previous, likely reflecting post treatment changes. No other focal airspace disease. No edema or effusion. No pneumothorax. No acute osseous abnormality. Remote fracture deformity of the left posterior 6 rib noted, stable. IMPRESSION: Stable appearance of the chest. No active cardiopulmonary disease identified. Electronically Signed   By: Jeannine Boga M.D.   On: 01/02/2020 01:51    Microbiology: Recent Results (from the past 240 hour(s))  SARS Coronavirus 2 by RT PCR (hospital order, performed in [facility] hospital  lab) Nasopharyngeal Nasopharyngeal Swab     Status: None   Collection Time: 01/02/20  2:39 AM   Specimen: Nasopharyngeal Swab  Result Value Ref Range Status   SARS Coronavirus 2 NEGATIVE NEGATIVE Final    Comment: (NOTE) SARS-CoV-2 target nucleic acids are NOT DETECTED.  The SARS-CoV-2 RNA is generally detectable in upper and lower respiratory specimens during the acute phase of infection. The lowest concentration of SARS-CoV-2 viral copies this assay can detect is 250 copies / mL. A negative result does not preclude SARS-CoV-2 infection and should not be used as the sole basis for treatment or other patient management decisions.  A negative result may occur with improper specimen collection / handling, submission of specimen other than nasopharyngeal swab, presence of viral mutation(s) within the areas targeted by this assay, and inadequate number of viral copies (<250 copies / mL). A negative result must be combined with clinical observations, patient history, and epidemiological information.  Fact Sheet for Patients:   StrictlyIdeas.no  Fact Sheet for Healthcare Providers: BankingDealers.co.za  This test is not yet approved or  cleared by the [place] FDA and has been authorized for detection and/or diagnosis of SARS-CoV-2 by FDA under an Emergency Use Authorization (EUA).  This EUA will remain in effect (meaning this test can be used) for the duration of the COVID-19 declaration under Section 564(b)(1) of the Act, 21 U.S.C. section 360bbb-3(b)(1), unless the authorization is terminated or revoked sooner.  Performed at [facility], [place] [address] [place], [place] [address]   Blood culture (routine x 2)     Status: None (Preliminary result)   Collection Time: 01/02/20  2:40 AM   Specimen:  BLOOD  Result Value Ref Range Status   Specimen Description BLOOD RIGHT ARM  Final   Special Requests   Final    BOTTLES DRAWN  AEROBIC AND ANAEROBIC Blood Culture adequate volume   Culture   Final    NO GROWTH 4 DAYS Performed at [facility], [address] [place], [place] [address]    Report Status PENDING  Incomplete  Blood culture (routine x 2)     Status: None (Preliminary result)   Collection Time: 01/02/20  3:30 AM   Specimen: BLOOD  Result Value Ref Range Status   Specimen Description BLOOD LEFT HAND  Final   Special Requests   Final    BOTTLES DRAWN AEROBIC AND ANAEROBIC Blood Culture adequate volume   Culture   Final    NO GROWTH 4 DAYS Performed at [facility], [place] [address], [place], [place] [address]    Report Status PENDING  Incomplete     Labs: Basic Metabolic Panel: Recent Labs  Lab 01/02/20 0207 01/02/20 0239 01/02/20 0820 01/04/20 0421 01/05/20 0629  NA 131*  131*  --  134* 137 137  K 7.8*  7.8* 3.6 3.6 3.8 4.2  CL 99  99  --  99 105 101  CO2  --   --  24 24 25   GLUCOSE 107*  107*  --  99 97 95  BUN 69*  69*  --  36* 19 17  CREATININE 1.60*  1.60*  --  0.96 0.74 0.71  CALCIUM  --   --  9.1 8.9 9.1   Liver Function Tests: Recent Labs  Lab 01/02/20 0820  AST 23  ALT 21  ALKPHOS 58  BILITOT 0.6  PROT 7.5  ALBUMIN 3.0*   No results for input(s): LIPASE, AMYLASE in the last 168 hours. No results for input(s): AMMONIA in the last 168 hours. CBC: Recent Labs  Lab 01/02/20 0157 01/02/20 0207 01/02/20 0820 01/04/20 0421 01/05/20 1017  WBC 9.0  --  9.1 7.1 8.2  NEUTROABS  --   --  6.5 4.6 5.8  HGB 12.9 13.9  13.9 11.8* 11.8* 13.2  HCT 41.5 41.0  41.0 38.6 38.4 42.9  MCV 88.1  --  88.9 89.7 88.6  PLT 238  --  205 236 269   Cardiac Enzymes: No results for input(s): CKTOTAL, CKMB, CKMBINDEX, TROPONINI in the last 168 hours. BNP: BNP (last 3 results) No results for input(s): BNP in the last 8760 hours.  ProBNP (last 3 results) No results for input(s): PROBNP in the last 8760 hours.  CBG: No results for input(s): GLUCAP in the last 168  hours.     Signed:  [place] Friendly, MD [facility] 01/06/2020, 4:38 PM

## 2020-01-06 NOTE — TOC Transition Note (Signed)
Transition of Care [facility]) - CM/SW Discharge Note   Patient Details  Name: [name] MRN: [id number] Date of Birth: May 17, 1946  Transition of [facility]) CM/SW Contact:  Carles Collet, RN Phone Number: 01/06/2020, 4:17 PM   Clinical Narrative:    Notified by CSW and MD that patient will DC to home today w HH. Referral placed to [facility], accepted for [facility] PT OT HHA. Order placed. Patient would benefit from [facility], this will be delivered to her home as she is transporting via [place]. Patient states she has home oxygen, nebulizer, and CPAP, as well as a rollator and two canes. She states she lives with two roommates. She states that she has an aid at her home from 10-3 every day. Who is also "on call" to her 24/7. She states that a Therapist, sports from [facility] comes every three weeks. She gets her meds delivered.  She will need [facility] transport, she states her roommates will be there to let her in the house.  Patient advised that she needs SNF level of care, but she does not want to return to SNF and has decided to DC to home w home health.       Final next level of care: [place] Barriers to Discharge: No Barriers Identified   Patient Goals and CMS Choice Patient states their goals for this hospitalization and ongoing recovery are:: To return home CMS Medicare.gov Compare Post Acute Care list provided to:: Patient Choice offered to / list presented to : Patient  Discharge Placement                       Discharge Plan and Services                DME Arranged: Lightweight manual wheelchair with seat cushion DME Agency: [employer]     Representative spoke with at DME Agency: [place] requested it to be delivered to her house HH Arranged: PT, OT, Nurse's Aide [place] Agency: [place] Date [place]: 01/06/20 Time [place]: [place] Representative spoke with at [place]: [place] ([place]) Interventions      Readmission Risk Interventions No flowsheet data found.

## 2020-01-06 NOTE — Progress Notes (Signed)
Patient educated on discharge instructions, questions answered. Patient given medication script to go home with, PT[facility] providing transportation home

## 2020-01-06 NOTE — Progress Notes (Signed)
Subjective: Patient reports "I'm doing good." Her only complaint is tobacco withdrawal and would like a nicotine patch. No acute events overnight.   Objective: Vital signs in last 24 hours: Temp:  [97.7 F (36.5 C)-99 F (37.2 C)] 98.5 F (36.9 C) (07/10 0729) Pulse Rate:  [62-77] 62 (07/10 0729) Resp:  [12-18] 16 (07/10 0729) BP: (88-110)/(43-70) 92/49 (07/10 0741) SpO2:  [92 %-100 %] 94 % (07/10 0729)  Intake/Output from previous day: No intake/output data recorded. Intake/Output this shift: Total I/O In: -  Out: 350 [Urine:350]  Physical Exam: The patient is conversant and AOx3. PERRL, gaze neutral, L hemiparesis.  Incision with purulent material and small area of dehiscence ventrally.   Lab Results: Recent Labs    01/04/20 0421 01/05/20 1017  WBC 7.1 8.2  HGB 11.8* 13.2  HCT 38.4 42.9  PLT 236 269   BMET Recent Labs    01/04/20 0421 01/05/20 0629  NA 137 137  K 3.8 4.2  CL 105 101  CO2 24 25  GLUCOSE 97 95  BUN 19 17  CREATININE 0.74 0.71  CALCIUM 8.9 9.1    Studies/Results: No results found.  Assessment/Plan: The patient is doing well and is stable from a neurosurgical perspective. 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 She has requested a nicotine patch. Neurosurgery has no objections to the patient receiving a nicotine patch. Continue abx as ordered. Continue working with therapies.     LOS: 4 days    Peggyann Shoals, MD 01/06/2020, 11:22 AM

## 2020-01-06 NOTE — Care Management (Signed)
    Durable Medical Equipment  (From admission, onward)         Start     Ordered   01/06/20 1613  For home use only DME lightweight manual wheelchair with seat cushion  Once       Comments: Patient suffers from weakness which impairs their ability to perform daily activities like walking in the home.  A walker will not resolve  issue with performing activities of daily living. A wheelchair will allow patient to safely perform daily activities. Patient is not able to propel themselves in the home using a standard weight wheelchair due to weakness. Patient can self propel in the lightweight wheelchair. Length of need lifetime. Accessories: elevating leg rests (ELRs), wheel locks, extensions and anti-tippers.   01/06/20 1613

## 2020-01-07 LAB — CULTURE, BLOOD (ROUTINE X 2)
Culture: NO GROWTH
Culture: NO GROWTH
Special Requests: ADEQUATE
Special Requests: ADEQUATE

## 2020-01-08 ENCOUNTER — Ambulatory Visit: Payer: Medicare Other | Admitting: Radiation Oncology

## 2020-01-08 ENCOUNTER — Encounter: Payer: Self-pay | Admitting: Radiation Oncology

## 2020-01-08 ENCOUNTER — Inpatient Hospital Stay: Payer: Medicare Other | Attending: Radiation Oncology

## 2020-01-09 ENCOUNTER — Encounter (HOSPITAL_COMMUNITY): Payer: Self-pay | Admitting: Emergency Medicine

## 2020-01-09 ENCOUNTER — Other Ambulatory Visit: Payer: Self-pay

## 2020-01-09 ENCOUNTER — Emergency Department (HOSPITAL_COMMUNITY)
Admission: EM | Admit: 2020-01-09 | Discharge: 2020-01-11 | Disposition: A | Discharge status: Home / Self Care | Payer: Medicare Other | Attending: Emergency Medicine | Admitting: Emergency Medicine

## 2020-01-09 DIAGNOSIS — N390 Urinary tract infection, site not specified: Secondary | ICD-10-CM | POA: Diagnosis not present

## 2020-01-09 DIAGNOSIS — E669 Obesity, unspecified: Secondary | ICD-10-CM | POA: Insufficient documentation

## 2020-01-09 DIAGNOSIS — F17210 Nicotine dependence, cigarettes, uncomplicated: Secondary | ICD-10-CM | POA: Diagnosis not present

## 2020-01-09 DIAGNOSIS — J45901 Unspecified asthma with (acute) exacerbation: Secondary | ICD-10-CM | POA: Diagnosis not present

## 2020-01-09 DIAGNOSIS — M791 Myalgia, unspecified site: Secondary | ICD-10-CM

## 2020-01-09 DIAGNOSIS — M25551 Pain in right hip: Secondary | ICD-10-CM | POA: Diagnosis present

## 2020-01-09 DIAGNOSIS — R531 Weakness: Secondary | ICD-10-CM | POA: Insufficient documentation

## 2020-01-09 DIAGNOSIS — F419 Anxiety disorder, unspecified: Secondary | ICD-10-CM | POA: Insufficient documentation

## 2020-01-09 DIAGNOSIS — J441 Chronic obstructive pulmonary disease with (acute) exacerbation: Secondary | ICD-10-CM | POA: Diagnosis not present

## 2020-01-09 DIAGNOSIS — N183 Chronic kidney disease, stage 3 unspecified: Secondary | ICD-10-CM | POA: Insufficient documentation

## 2020-01-09 DIAGNOSIS — M25521 Pain in right elbow: Secondary | ICD-10-CM | POA: Insufficient documentation

## 2020-01-09 DIAGNOSIS — Z789 Other specified health status: Secondary | ICD-10-CM

## 2020-01-09 DIAGNOSIS — I129 Hypertensive chronic kidney disease with stage 1 through stage 4 chronic kidney disease, or unspecified chronic kidney disease: Secondary | ICD-10-CM | POA: Diagnosis not present

## 2020-01-09 DIAGNOSIS — Z85118 Personal history of other malignant neoplasm of bronchus and lung: Secondary | ICD-10-CM | POA: Diagnosis not present

## 2020-01-09 DIAGNOSIS — Z20822 Contact with and (suspected) exposure to covid-19: Secondary | ICD-10-CM | POA: Insufficient documentation

## 2020-01-09 DIAGNOSIS — M25522 Pain in left elbow: Secondary | ICD-10-CM | POA: Insufficient documentation

## 2020-01-09 DIAGNOSIS — R296 Repeated falls: Secondary | ICD-10-CM

## 2020-01-09 LAB — CBC WITH DIFFERENTIAL/PLATELET
Abs Immature Granulocytes: 0.08 10*3/uL — ABNORMAL HIGH (ref 0.00–0.07)
Basophils Absolute: 0 10*3/uL (ref 0.0–0.1)
Basophils Relative: 0 %
Eosinophils Absolute: 0.1 10*3/uL (ref 0.0–0.5)
Eosinophils Relative: 1 %
HCT: 48.5 % — ABNORMAL HIGH (ref 36.0–46.0)
Hemoglobin: 15.1 g/dL — ABNORMAL HIGH (ref 12.0–15.0)
Immature Granulocytes: 1 %
Lymphocytes Relative: 33 %
Lymphs Abs: 4 10*3/uL (ref 0.7–4.0)
MCH: 27.7 pg (ref 26.0–34.0)
MCHC: 31.1 g/dL (ref 30.0–36.0)
MCV: 89 fL (ref 80.0–100.0)
Monocytes Absolute: 0.6 10*3/uL (ref 0.1–1.0)
Monocytes Relative: 5 %
Neutro Abs: 7.3 10*3/uL (ref 1.7–7.7)
Neutrophils Relative %: 60 %
Platelets: 423 10*3/uL — ABNORMAL HIGH (ref 150–400)
RBC: 5.45 MIL/uL — ABNORMAL HIGH (ref 3.87–5.11)
RDW: 14.8 % (ref 11.5–15.5)
WBC: 12.1 10*3/uL — ABNORMAL HIGH (ref 4.0–10.5)
nRBC: 0 % (ref 0.0–0.2)

## 2020-01-09 LAB — BASIC METABOLIC PANEL
Anion gap: 17 — ABNORMAL HIGH (ref 5–15)
BUN: 27 mg/dL — ABNORMAL HIGH (ref 8–23)
CO2: 24 mmol/L (ref 22–32)
Calcium: 9.6 mg/dL (ref 8.9–10.3)
Chloride: 100 mmol/L (ref 98–111)
Creatinine, Ser: 0.89 mg/dL (ref 0.44–1.00)
GFR calc Af Amer: >60 mL/min (ref >60)
GFR calc non Af Amer: >60 mL/min (ref >60)
Glucose, Bld: 77 mg/dL (ref 70–99)
Potassium: 4.7 mmol/L (ref 3.5–5.1)
Sodium: 141 mmol/L (ref 135–145)

## 2020-01-09 MED ORDER — QUETIAPINE FUMARATE ER 50 MG PO TB24
150.0000 mg | ORAL_TABLET | Freq: Every day | ORAL | Status: DC
  Administered 2020-01-10: 150 mg via ORAL
  Filled 2020-01-09 (×2): qty 3

## 2020-01-09 MED ORDER — GABAPENTIN 400 MG PO CAPS
800.0000 mg | ORAL_CAPSULE | Freq: Three times a day (TID) | ORAL | Status: DC
  Administered 2020-01-10 – 2020-01-11 (×5): 800 mg via ORAL
  Filled 2020-01-09: qty 2
  Filled 2020-01-09 (×2): qty 8
  Filled 2020-01-09: qty 2
  Filled 2020-01-09: qty 8

## 2020-01-09 MED ORDER — BICTEGRAVIR-EMTRICITAB-TENOFOV 50-200-25 MG PO TABS
1.0000 | ORAL_TABLET | Freq: Every day | ORAL | Status: DC
  Administered 2020-01-10 – 2020-01-11 (×2): 1 via ORAL
  Filled 2020-01-09 (×2): qty 1

## 2020-01-09 MED ORDER — ROPINIROLE HCL 1 MG PO TABS
1.0000 mg | ORAL_TABLET | Freq: Every day | ORAL | Status: DC
  Administered 2020-01-10 (×2): 1 mg via ORAL
  Filled 2020-01-09 (×2): qty 1

## 2020-01-09 MED ORDER — APIXABAN 5 MG PO TABS
5.0000 mg | ORAL_TABLET | Freq: Two times a day (BID) | ORAL | Status: DC
  Administered 2020-01-10 – 2020-01-11 (×4): 5 mg via ORAL
  Filled 2020-01-09 (×4): qty 1

## 2020-01-09 MED ORDER — LORATADINE 10 MG PO TABS
10.0000 mg | ORAL_TABLET | Freq: Every day | ORAL | Status: DC
  Administered 2020-01-10 – 2020-01-11 (×2): 10 mg via ORAL
  Filled 2020-01-09 (×2): qty 1

## 2020-01-09 MED ORDER — MONTELUKAST SODIUM 10 MG PO TABS
10.0000 mg | ORAL_TABLET | Freq: Every day | ORAL | Status: DC
  Administered 2020-01-10 (×2): 10 mg via ORAL
  Filled 2020-01-09 (×3): qty 1

## 2020-01-09 MED ORDER — HYDROXYZINE HCL 25 MG PO TABS
25.0000 mg | ORAL_TABLET | Freq: Four times a day (QID) | ORAL | Status: DC
  Administered 2020-01-10 – 2020-01-11 (×6): 25 mg via ORAL
  Filled 2020-01-09 (×6): qty 1

## 2020-01-09 MED ORDER — POLYETHYLENE GLYCOL 3350 17 G PO PACK
17.0000 g | PACK | Freq: Two times a day (BID) | ORAL | Status: DC
  Administered 2020-01-10 – 2020-01-11 (×4): 17 g via ORAL
  Filled 2020-01-09 (×4): qty 1

## 2020-01-09 MED ORDER — DULOXETINE HCL 20 MG PO CPEP
20.0000 mg | ORAL_CAPSULE | Freq: Every day | ORAL | Status: DC
  Administered 2020-01-10 – 2020-01-11 (×2): 20 mg via ORAL
  Filled 2020-01-09 (×2): qty 1

## 2020-01-09 MED ORDER — ACETAMINOPHEN 500 MG PO TABS
1000.0000 mg | ORAL_TABLET | Freq: Once | ORAL | Status: AC
  Administered 2020-01-09: 1000 mg via ORAL
  Filled 2020-01-09: qty 2

## 2020-01-09 MED ORDER — VENLAFAXINE HCL ER 75 MG PO CP24
150.0000 mg | ORAL_CAPSULE | Freq: Every day | ORAL | Status: DC
  Administered 2020-01-10 – 2020-01-11 (×2): 150 mg via ORAL
  Filled 2020-01-09 (×2): qty 2

## 2020-01-09 NOTE — Social Work (Signed)
[facility] Education officer, museum covering remotely, received call from [place] regarding Pt. Per PA, Pt had previously declined SNF placement preferring to d/c home with [facility]. Reportedly, however, Pt has been unable to care for self at home and has reconsidered SNF option.  CSW was unable to reach Pt via phone either by cell or by calling into [facility] to confirm that Pt is willing to accept finacial responsibility for cost of SNF placement having run out of SNF days.   CSW sent out previous FL2 in an effort to expedite process for 7/14 [facility] team. Please note: PT will need a new PASRR # should she wish to go to SNF due to expiration of PASRR on 01/05/20.  Vergie Living MSW LCSWA Transitions of Care  Clinical Social Worker  [facility]  [phone]

## 2020-01-09 NOTE — ED Notes (Signed)
ED phlebotomy unable to obtain labs. Main phlebotomy called for lab draw

## 2020-01-09 NOTE — ED Notes (Addendum)
RN attempted to draw labs, but was unsuccessful. Phlebotomy consulted for blood draw.

## 2020-01-09 NOTE — ED Notes (Signed)
Pt is still unable to provide urine sample at this time. 

## 2020-01-09 NOTE — ED Provider Notes (Signed)
[facility] DEPT Provider Note   CSN: [id number] Arrival date & time: 01/09/20  1804     History Chief Complaint  Patient presents with  . Hip Pain  . Elbow Pain    [name] is a 74 y.o. 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 Per EMS pt called out due to pain in her R hip and bilateral elbows - they report pt was admitted to [facility] place for a fall and discharged on Saturday. Per EMS pt had an additional fall on Sunday. When EMS arrived pt was covered in her urine. She is also suppsoed to be on chronic home O2 however has been noncompliant due to poor living conditions.   Pt tells me that she called EMS due to "pain all over." She states she only has aspirin at home and nothing else to take for pain. She states she fell again on Sunday however landed onto a pile of clothes. She states she has been having diffuse pain since then. It appears that she was actually discharged from [facility] the Saturday before last and admitted to the hospital - please see hospital course for 2 separate stays below:   Per chart review: Pt initially presented to the ED on 05/25 s/2 fall with prolonged down time.  Hospital Course: Patient presented to the ED after falling at home and being down for a prolonged period. She presented with hypotension and mild hypoxemia with altered mental status, so a head CT was obtained. This showed a significant enlargement of a very small previously seen dural-based mass. It appeared concerning for either metastatic disease or a rapidly growing meningioma. CT CAP was negative for a primary source or other metastatic disease, so she was taken to the OR on 6/1 for a right craniotomy for resection of tumor.  The tumor was infiltrating cortex and appeared malignant. She had post-operative left sided neglect and weakness. Post-op MRI showed a gross total resection, final pathology confirmed poorly differentiated carcinoma. Her anticoagulation was restarted on POD5. PT/OT recommended SNF. Her hospital course was otherwise uncomplicated and the patient was discharged to SNF on 12/08/19. She will follow up in clinic with me in 2 weeks. She was also given instructions to follow up with her oncologist, Dr. [name], for further workup to determine the primary source of her intracranial malignancy. She was aware of the final pathology.  Return ED visit on 7/06 for additional fall. History of present illness:  [name] a 74 y.o.femalewithhistory of HIV, COPD, history of stroke with left-sided hemiplegia, history of PE on apixaban, history of COPD and history of lung cancer admitted in June last month for fall at the time was found to have increasing swelling of the known history of meningioma and patient underwent craniotomy, biopsies of which showed it to be carcinoma and has follow-up with Dr. [name] oncologist. Patient states she was on the wheelchair yesterday when she was trying to pick something she lost control and fell over and hit her head. Did not lose consciousness. In the ER patient had CT head C-spine chest abdomen pelvis which shows scalp contusion and also postsurgical changes at the site where patient had the surgery for the brain mass. There was concern for  possible abscess at the site and on-call neurosurgeon Dr. Venetia Constable was consulted, blood cultures obtained and started on empiric antibiotics. Trauma surgery also was consulted. Labs show acute renal failure with creatinine of 1.6 creatinine was normal during last month. Potassium initially was 7.8 but repeat was 3.6 CBC was unremarkable. Patient admitted for possible intracranial abscess for which empiric antibiotics have been  started neurosurgery has been consulted and since patient is on apixaban reversal of the apixaban was requested by neurosurgery.  Today, patient denies any new complaints, denies any headaches, chest pain, shortness of breath, nausea/vomiting, fever/chills.  SNF was recommended for patient, but patient unable to afford SNF as she has used up all her Medicaid days.  Patient insisting to be discharged home.  Will discharge patient with home health PT/OT/aide.  Patient advised to follow-up with her PCP, and neurosurgery for further management.   The history is provided by the patient, medical records and the EMS personnel.       Past Medical History:  Diagnosis Date  . Asthma   . Atypical squamous cells of undetermined significance (ASCUS) on Papanicolaou smear of cervix   . Bed bug bite 07/12/2019  . Cancer ([place])   . COPD (chronic obstructive pulmonary disease) ([place])   . H/O drainage of abscess    left axilla, from left breast  . Hep B w/o coma   . HIV (human immunodeficiency virus infection) ([place])   . Hyperlipidemia   . Lung cancer ([place]) 12/14/13   LUL Adenocarcinoma  . Neuromuscular disorder ([place])    fingers/feet neuropathy  . Non-small cell lung cancer ([place])   . Renal disorder   . S/P radiation therapy 01/26/14-02/02/14   sbrt  lt upper lung-54Gy/47fx  . Stroke [facility])     Patient Active Problem List   Diagnosis Date Noted  . 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 HIV (human immunodeficiency virus infection) ([place]) 01/02/2020  . Brain mass 01/02/2020  . OSA (obstructive sleep apnea) 01/02/2020  . Delusional disorder ([place]) 12/06/2019  . Cerebral edema (HCC)   . Encephalopathy   . HIV disease ([place])   . OSA (obstructive sleep apnea)   . Dyslipidemia   . History of CVA with residual deficit   . Brain tumor ([place]) 11/28/2019  . Change in mental status 11/21/2019  . Bed bug bite 07/12/2019  . DNR (do not resuscitate)   . Altered mental status   . Acute pulmonary embolism without acute  cor pulmonale (HCC)   . Acute ischemic right MCA stroke ([place])   . Palliative care encounter   . Aortic embolism or thrombosis ([place])   . CVA (cerebral vascular accident) ([place]) 03/02/2019  . COPD (chronic obstructive pulmonary disease) ([place]) 03/01/2019  . PE (pulmonary thromboembolism) ([place]) 03/01/2019  . Tobacco abuse 03/01/2019  . 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[place]) 03/01/2019  . CKD (chronic kidney disease), stage III 03/01/2019  . Generalized weakness 03/01/2019  . Fall at home   . 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([place])   . Splenic infarct   . Acute and chronic respiratory failure (acute-on-chronic) ([place]) 02/11/2019  . CKD (chronic kidney disease) stage 3, GFR 30-59 ml/min 08/12/2016  . Lung nodule 10/12/2015  . Bronchitis 09/09/2015  . History of lung cancer 09/08/2015  . Thoracic aorta atherosclerosis ([place]) 09/08/2015  . Chronic obstructive pulmonary disease with (acute) exacerbation ([place]) 09/08/2015  . Sepsis ([place]) 03/28/2015  . CAP (community acquired pneumonia) 03/28/2015  . UTI (lower urinary tract infection) 03/28/2015  . Respiratory failure ([place]) 03/28/2015  . Non-small cell lung cancer (  [place])   . Malignant neoplasm of upper lobe, bronchus or lung 01/03/2014  . Acute hypoxemic respiratory failure ([place]) 10/31/2013  . COPD with acute exacerbation ([place]) 10/30/2013  . COPD exacerbation ([place]) 10/30/2013  . Other and unspecified noninfectious gastroenteritis and colitis(558.9) 11/20/2012  . Rectal bleed 11/19/2012  . Environmental allergies 11/02/2012  . Obesity 05/05/2012  . Renal insufficiency 05/05/2012  . Hyperglycemia 05/05/2012  . Asthma 12/10/2011  . Cigarette smoker 12/10/2011  . Hypercholesteremia 12/11/2010  . Hypertension 12/11/2010  . ALLERGIC RHINITIS, SEASONAL 07/03/2010  . COPD 07/03/2010  . HIV (human immunodeficiency virus infection) ([place]) 12/09/2006  . DEPRESSION 12/09/2006  . BREAST MASS, BENIGN 12/09/2006  . OSTEOPOROSIS 12/09/2006    Past Surgical History:  Procedure  Laterality Date  . ABCESS DRAINAGE     abcess under Left arm, abcess from L breast  . APPLICATION OF CRANIAL NAVIGATION Right 11/28/2019   Procedure: APPLICATION OF CRANIAL NAVIGATION;  Surgeon: [name], MD;  Location: [facility];  Service: Neurosurgery;  Laterality: Right;  . BRAIN SURGERY    . BUBBLE STUDY  03/08/2019   Procedure: BUBBLE STUDY;  Surgeon: [name], MD;  Location: [facility];  Service: Cardiovascular;;  . CRANIOTOMY Right 11/28/2019   Procedure: Right Craniotomy for Tumor Resection;  Surgeon: [name], MD;  Location: [facility];  Service: Neurosurgery;  Laterality: Right;  . CRANIOTOMY    . removal of abnormal cells on uterus    . TEE WITHOUT CARDIOVERSION N/A 03/08/2019   Procedure: TRANSESOPHAGEAL ECHOCARDIOGRAM (TEE);  Surgeon: [name], MD;  Location: [facility] ENDOSCOPY;  Service: Cardiovascular;  Laterality: N/A;  . thyroid gland removed       OB History   No obstetric history on file.     Family History  Problem Relation Age of Onset  . Pneumonia Mother   . Hypertension Sister   . Diabetes Sister   . Cancer Sister        pancreatic and lung  . Cancer Brother        pancreatic  . Cancer Brother        pancreatic    Social History   Tobacco Use  . Smoking status: Never Smoker  . Smokeless tobacco: Never Used  . Tobacco comment: quit date the day she starts her radiation at the [facility]  Substance Use Topics  . Alcohol use: Not Currently    Comment: none in 20 years  . Drug use: Not Currently    Comment: none in 20 years    Home Medications Prior to Admission medications   Medication Sig Start Date End Date Taking? Authorizing Provider  albuterol (PROVENTIL) (2.5 MG/3ML) 0.083% nebulizer solution Take 2.5 mg by nebulization every 4 (four) hours as needed for wheezing or shortness of breath.   Yes [provider]  albuterol (VENTOLIN HFA) 108 (90 Base) MCG/ACT inhaler Inhale 1 puff into the lungs every 6 (six) hours  as needed for shortness of breath.  12/23/19  Yes [provider]  apixaban (ELIQUIS) 5 MG TABS tablet Take 1 tablet (5 mg total) by mouth 2 (two) times daily. 03/13/19  Yes Hosie Poisson, MD  Ascorbic Acid (VITAMIN C) 100 MG tablet Take 100 mg by mouth daily.   Yes [provider]  B Complex-Biotin-FA (VITAMIN B50 COMPLEX PO) Take 1 tablet by mouth daily.   Yes [provider]  bictegravir-emtricitabine-tenofovir AF (BIKTARVY) 50-200-25 MG TABS tablet Take 1 tablet by mouth daily. 07/12/19  Yes [place], [place],  MD  diclofenac Sodium (VOLTAREN) 1 % GEL Apply 4 g topically 2 (two) times daily as needed for pain. 11/09/19  Yes [provider]  DULoxetine (CYMBALTA) 20 MG capsule Take 20 mg by mouth daily.   Yes [provider]  Fluticasone-Salmeterol (ADVAIR) 500-50 MCG/DOSE AEPB Inhale 1 puff into the lungs 2 (two) times daily. 09/04/19  Yes [provider]  gabapentin (NEURONTIN) 800 MG tablet Take 800 mg by mouth 3 (three) times daily. 10/26/19  Yes [provider]  hydrOXYzine (ATARAX/VISTARIL) 25 MG tablet Take 25 mg by mouth every 6 (six) hours. 08/03/19  Yes [provider]  ibuprofen (ADVIL) 200 MG tablet Take 800 mg by mouth every 6 (six) hours as needed for moderate pain.   Yes [provider]  loratadine (CLARITIN) 10 MG tablet Take 10 mg by mouth daily. 09/04/19  Yes [provider]  montelukast (SINGULAIR) 10 MG tablet Take 10 mg by mouth at bedtime.  09/04/19  Yes [provider]  polyethylene glycol (MIRALAX / GLYCOLAX) 17 g packet Take 17 g by mouth 2 (two) times daily.    Yes [provider]  QUEtiapine Fumarate (SEROQUEL XR) 150 MG 24 hr tablet Take 150 mg by mouth at bedtime. 11/09/19  Yes [provider]  rOPINIRole (REQUIP) 1 MG tablet Take 1 tablet (1 mg total) by mouth at bedtime. 05/20/19  Yes Ward, Kristen N, DO  SPIRIVA RESPIMAT 2.5 MCG/ACT AERS INHALE 2 PUFFS INTO THE  LUNGS DAILY Patient taking differently: Inhale 2 puffs into the lungs daily.  09/24/14  Yes Rigoberto Noel, MD  tiZANidine (ZANAFLEX) 4 MG tablet Take 4 mg by mouth 2 (two) times daily as needed for muscle spasms.  11/08/19  Yes [provider]  traMADol (ULTRAM) 50 MG tablet Take 50 mg by mouth every 8 (eight) hours as needed (pain).  12/11/19  Yes [provider]  triamcinolone ointment (KENALOG) 0.5 % Apply 1 application topically 2 (two) times daily. 08/01/19  Yes Larene Pickett, PA-C  triamcinolone ointment (KENALOG) 0.5 % Apply 1 application topically 2 (two) times daily. 08/08/19  Yes [provider]  venlafaxine XR (EFFEXOR-XR) 150 MG 24 hr capsule Take 150 mg by mouth daily. 11/09/19  Yes [provider]  VITAMIN A PO Take 1 tablet by mouth daily.   Yes [provider]  albuterol (PROVENTIL) (2.5 MG/3ML) 0.083% nebulizer solution Take 3 mLs (2.5 mg total) by nebulization every 4 (four) hours as needed for wheezing or shortness of breath. Patient not taking: Reported on 01/09/2020 01/18/19   Jaynee Eagles, PA-C  doxycycline (VIBRA-TABS) 100 MG tablet Take 1 tablet (100 mg total) by mouth every 12 (twelve) hours for 11 doses. Patient not taking: Reported on 01/09/2020 01/06/20 01/12/20  Alma Friendly, MD  hydrOXYzine (ATARAX/VISTARIL) 25 MG tablet Take 1 tablet (25 mg total) by mouth every 6 (six) hours. Patient not taking: Reported on 01/09/2020 07/02/19   Raylene Everts, MD    Allergies    Patient has no known allergies.  Review of Systems   Review of Systems  Constitutional: Negative for fever.  Musculoskeletal: Positive for arthralgias.  All other systems reviewed and are negative.   Physical Exam Updated Vital Signs BP 127/83 (BP Location: Right Arm)   Pulse 87   Temp 97.7 F (36.5 C) (Oral)   Resp 18   SpO2 99%   Physical Exam Vitals and nursing note reviewed.  Constitutional:      Appearance: She  is not ill-appearing or  diaphoretic.  HENT:     Head: Normocephalic and atraumatic.  Eyes:     Extraocular Movements: Extraocular movements intact.     Conjunctiva/sclera: Conjunctivae normal.     Pupils: Pupils are equal, round, and reactive to light.  Cardiovascular:     Rate and Rhythm: Normal rate and regular rhythm.     Pulses: Normal pulses.  Pulmonary:     Effort: Pulmonary effort is normal.     Breath sounds: Normal breath sounds. No wheezing, rhonchi or rales.  Abdominal:     Palpations: Abdomen is soft.     Tenderness: There is no abdominal tenderness. There is no guarding or rebound.  Musculoskeletal:        General: No tenderness.     Cervical back: Neck supple.     Comments: No overlying skin changes/signs of trauma to extremities or back. Pt does not react to pain with deep palpation and movement of all extremities.   Skin:    General: Skin is warm and dry.  Neurological:     Mental Status: She is alert.     Comments: Moving all extremities without difficulty.  Alert and oriented x 4 Follows commands easily Residual left sided weakness     ED Results / Procedures / Treatments   Labs (all labs ordered are listed, but only abnormal results are displayed) Labs Reviewed  BASIC METABOLIC PANEL - Abnormal; Notable for the following components:      Result Value   BUN 27 (*)    Anion gap 17 (*)    All other components within normal limits  SARS CORONAVIRUS 2 BY RT PCR (HOSPITAL ORDER, [facility])  CBC WITH DIFFERENTIAL/PLATELET  URINALYSIS, ROUTINE W REFLEX MICROSCOPIC  CBC WITH DIFFERENTIAL/PLATELET    EKG None  Radiology No results found.  Procedures Procedures (including critical care time)  Medications Ordered in ED Medications  acetaminophen (TYLENOL) tablet 1,000 mg (1,000 mg Oral Given 01/09/20 1923)    ED Course  I have reviewed the triage vital signs and the nursing notes.  Pertinent labs & imaging results that were available during my  care of the patient were reviewed by me and considered in my medical decision making (see chart for details).    MDM Rules/Calculators/A&P                          74 year old female presents to the ED today via EMS with initial complaint of right hip and bilateral elbow pain however while in the room patient states she called out due to diffuse pain all over.  She was recently discharged from the hospital 3 days ago however it does appear they strongly wanted her to go to a SNF.  She did not want to relinquish her disability check at that point and so she decided to go home.  Arrival to the ED patient is afebrile, nontachycardic and nontachypneic.  She appears to be in no acute distress.  There are no signs of new head trauma.  She states she thinks she fell 3 days ago but landed on a pile of clothes.  She has no obvious signs of trauma to her body.  She does not react with pain with range of motion or deep palpation of her entire extremities, back, chest, abdomen.  I do not feel patient needs x-rays at this time.  I will obtain screening labs and reassess.  Does appear  that patient is unable to take care of herself at her home.  Per EMS patient was covered in her urine when they found her.  I will reconsult social work at this time. Tylenol provided for pain.  Pt very hard stick and blood work was delayed.  BMP with a BUN of 27 and gap of 17. Suspect s/2 dehydration. Creatinine within normal limits at 0.89. Will have patient rehydrate orally as no IV was placed.   Have discussed case with social work; pt had FL2 filled out during hospitalization last week. Will see if this is still eligible. Recommends PT eval at this time. Pt will board until a SNF is found for her. Covid test ordered.   This note was prepared using Dragon voice recognition software and may include unintentional dictation errors due to the inherent limitations of voice recognition software.  Final Clinical Impression(s) / ED  Diagnoses Final diagnoses:  Myalgia    Rx / DC Orders ED Discharge Orders    None       Eustaquio Maize, Hershal Coria 01/09/20 2234    Lacretia Leigh, MD 01/10/20 [phone]

## 2020-01-09 NOTE — ED Triage Notes (Signed)
Pt BIB EMS from home c/o pain in right hip and bilateral elbows. Pt was admitted to [facility] place for a fall and d/c on Saturday. Pt had an additional fall on Sunday. Hx of stroke with left sided deficits. Slurred speech at baseline. Pt was independent prior to stroke but now unable to care for herself. When EMS arrived, pt was saturated in urine. Hx of COPD. Reports pt is on chronic o2, but is noncompliant due to poor living conditions.

## 2020-01-10 ENCOUNTER — Ambulatory Visit: Payer: Medicare Other | Admitting: Radiation Oncology

## 2020-01-10 DIAGNOSIS — M25521 Pain in right elbow: Secondary | ICD-10-CM | POA: Diagnosis not present

## 2020-01-10 LAB — URINALYSIS, ROUTINE W REFLEX MICROSCOPIC
Bacteria, UA: NONE SEEN
Bilirubin Urine: NEGATIVE
Glucose, UA: NEGATIVE mg/dL
Hgb urine dipstick: NEGATIVE
Ketones, ur: 20 mg/dL — AB
Leukocytes,Ua: NEGATIVE
Nitrite: NEGATIVE
Protein, ur: 30 mg/dL — AB
Specific Gravity, Urine: 1.02 (ref 1.005–1.030)
pH: 5 (ref 5.0–8.0)

## 2020-01-10 LAB — SARS CORONAVIRUS 2 BY RT PCR (HOSPITAL ORDER, PERFORMED IN ~~LOC~~ HOSPITAL LAB): SARS Coronavirus 2: NEGATIVE

## 2020-01-10 NOTE — Progress Notes (Signed)
TOC CM will updated [facility] SNF that pt has not received COVID vaccine. [place], [place] ED TOC CM [phone]

## 2020-01-10 NOTE — NC FL2 (Signed)
[place] LEVEL OF CARE SCREENING TOOL     IDENTIFICATION  Patient Name: [name] Birthdate: 10/17/1945 Sex: female Admission Date (Current Location): 01/09/2020  [place] and [place] Number:  [name] [id number] M (Secondary) Facility and Address:  [facility],  [place] [address], [place]      Provider Number: [id number]  Attending Physician Name and Address:  Default, Provider, MD  Relative Name and Phone Number:  Bechler, BJ/ Brother       [phone]    Current Level of Care: Hospital Recommended Level of Care: [place] Prior Approval Number: [id number]  Date Approved/Denied: 01/10/20 Bernadene Bell Reference Number) PASRR Number:    Discharge Plan:      Current Diagnoses: Patient Active Problem List   Diagnosis Date Noted  . 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 HIV (human immunodeficiency virus infection) ([place]) 01/02/2020  . Brain mass 01/02/2020  . OSA (obstructive sleep apnea) 01/02/2020  . Delusional disorder ([place]) 12/06/2019  . Cerebral edema (HCC)   . Encephalopathy   . HIV disease ([place])   . OSA (obstructive sleep apnea)   . Dyslipidemia   . History of CVA with residual deficit   . Brain tumor ([place]) 11/28/2019  . Change in mental status 11/21/2019  . Bed bug bite 07/12/2019  . DNR (do not resuscitate)   . Altered mental status   . Acute pulmonary embolism without acute cor pulmonale (HCC)   . Acute ischemic right MCA stroke ([place])   . Palliative care encounter   . Aortic embolism or thrombosis ([place])   . CVA (cerebral vascular accident) ([place]) 03/02/2019  . COPD (chronic obstructive pulmonary disease) ([place]) 03/01/2019  . PE (pulmonary thromboembolism) ([place]) 03/01/2019  . Tobacco abuse 03/01/2019  . 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[place]) 03/01/2019  . CKD (chronic kidney disease), stage III 03/01/2019  . Generalized weakness 03/01/2019  . Fall at home   . 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([place])   . Splenic infarct   . Acute and chronic  respiratory failure (acute-on-chronic) ([place]) 02/11/2019  . CKD (chronic kidney disease) stage 3, GFR 30-59 ml/min 08/12/2016  . Lung nodule 10/12/2015  . Bronchitis 09/09/2015  . History of lung cancer 09/08/2015  . Thoracic aorta atherosclerosis ([place]) 09/08/2015  . Chronic obstructive pulmonary disease with (acute) exacerbation ([place]) 09/08/2015  . Sepsis ([place]) 03/28/2015  . CAP (community acquired pneumonia) 03/28/2015  . UTI (lower urinary tract infection) 03/28/2015  . Respiratory failure ([place]) 03/28/2015  . Non-small cell lung cancer ([place])   . Malignant neoplasm of upper lobe, bronchus or lung 01/03/2014  . Acute hypoxemic respiratory failure ([place]) 10/31/2013  . COPD with acute exacerbation ([place]) 10/30/2013  . COPD exacerbation ([place]) 10/30/2013  . Other and unspecified noninfectious gastroenteritis and colitis(558.9) 11/20/2012  . Rectal bleed 11/19/2012  . Environmental allergies 11/02/2012  . Obesity 05/05/2012  . Renal insufficiency 05/05/2012  . Hyperglycemia 05/05/2012  . Asthma 12/10/2011  . Cigarette smoker 12/10/2011  . Hypercholesteremia 12/11/2010  . Hypertension 12/11/2010  . ALLERGIC RHINITIS, SEASONAL 07/03/2010  . COPD 07/03/2010  . HIV (human immunodeficiency virus infection) ([place]) 12/09/2006  . DEPRESSION 12/09/2006  . BREAST MASS, BENIGN 12/09/2006  . OSTEOPOROSIS 12/09/2006    Orientation RESPIRATION BLADDER Height & Weight     Self, Time, Situation, Place  Normal Incontinent Weight:   Height:     BEHAVIORAL SYMPTOMS/MOOD NEUROLOGICAL BOWEL NUTRITION STATUS      Continent Diet (See dc summary)  AMBULATORY STATUS COMMUNICATION OF NEEDS Skin   Extensive Assist Verbally Normal  Personal Care Assistance Level of Assistance  Bathing, Dressing, Feeding Bathing Assistance: Maximum assistance Feeding assistance: Limited assistance Dressing Assistance: Maximum assistance     Functional Limitations Info  Sight, Hearing, Speech  Sight Info: Adequate Hearing Info: Adequate Speech Info: Impaired (Dysarthria)    SPECIAL CARE FACTORS FREQUENCY  PT (By licensed PT), OT (By licensed OT)     PT Frequency: 5x/wk OT Frequency: 5x/wk            Contractures Contractures Info: Not present    Additional Factors Info  Code Status, Allergies Code Status Info: Full Code Allergies Info: No Known Allergies Psychotropic Info: Cymbalta 20mg  daily, Seroquel 150mg  daily at bed, Effexor-XR 150mg  daily         Current Medications (01/10/2020):  This is the current hospital active medication list Current Facility-Administered Medications  Medication Dose Route Frequency Provider Last Rate Last Admin  . apixaban (ELIQUIS) tablet 5 mg  5 mg Oral BID Alroy Bailiff, Margaux, PA-C   5 mg at 01/10/20 0925  . bictegravir-emtricitabine-tenofovir AF (BIKTARVY) 50-200-25 MG per tablet 1 tablet  1 tablet Oral Daily Eustaquio Maize, PA-C   1 tablet at 01/10/20 0925  . DULoxetine (CYMBALTA) DR capsule 20 mg  20 mg Oral Daily Venter, Margaux, PA-C   20 mg at 01/10/20 0925  . gabapentin (NEURONTIN) capsule 800 mg  800 mg Oral TID Alroy Bailiff, Margaux, PA-C   800 mg at 01/10/20 0925  . hydrOXYzine (ATARAX/VISTARIL) tablet 25 mg  25 mg Oral Q6H Venter, Margaux, PA-C   25 mg at 01/10/20 1101  . loratadine (CLARITIN) tablet 10 mg  10 mg Oral Daily Eustaquio Maize, PA-C   10 mg at 01/10/20 0926  . montelukast (SINGULAIR) tablet 10 mg  10 mg Oral QHS Venter, Margaux, PA-C   10 mg at 01/10/20 0105  . polyethylene glycol (MIRALAX / GLYCOLAX) packet 17 g  17 g Oral BID Venter, Margaux, PA-C   17 g at 01/10/20 1100  . QUEtiapine (SEROQUEL XR) 24 hr tablet 150 mg  150 mg Oral QHS Venter, Margaux, PA-C      . rOPINIRole (REQUIP) tablet 1 mg  1 mg Oral QHS Venter, Margaux, PA-C   1 mg at 01/10/20 0105  . venlafaxine XR (EFFEXOR-XR) 24 hr capsule 150 mg  150 mg Oral Daily Eustaquio Maize, PA-C   150 mg at 01/10/20 6606   Current Outpatient Medications  Medication  Sig Dispense Refill  . albuterol (PROVENTIL) (2.5 MG/3ML) 0.083% nebulizer solution Take 2.5 mg by nebulization every 4 (four) hours as needed for wheezing or shortness of breath.    Marland Kitchen albuterol (VENTOLIN HFA) 108 (90 Base) MCG/ACT inhaler Inhale 1 puff into the lungs every 6 (six) hours as needed for shortness of breath.     Marland Kitchen apixaban (ELIQUIS) 5 MG TABS tablet Take 1 tablet (5 mg total) by mouth 2 (two) times daily. 60 tablet 1  . Ascorbic Acid (VITAMIN C) 100 MG tablet Take 100 mg by mouth daily.    . B Complex-Biotin-FA (VITAMIN B50 COMPLEX PO) Take 1 tablet by mouth daily.    . bictegravir-emtricitabine-tenofovir AF (BIKTARVY) 50-200-25 MG TABS tablet Take 1 tablet by mouth daily. 30 tablet 11  . diclofenac Sodium (VOLTAREN) 1 % GEL Apply 4 g topically 2 (two) times daily as needed for pain.    . DULoxetine (CYMBALTA) 20 MG capsule Take 20 mg by mouth daily.    . Fluticasone-Salmeterol (ADVAIR) 500-50 MCG/DOSE AEPB Inhale 1 puff into the lungs 2 (two) times  daily.    . gabapentin (NEURONTIN) 800 MG tablet Take 800 mg by mouth 3 (three) times daily.    . hydrOXYzine (ATARAX/VISTARIL) 25 MG tablet Take 25 mg by mouth every 6 (six) hours.    Marland Kitchen ibuprofen (ADVIL) 200 MG tablet Take 800 mg by mouth every 6 (six) hours as needed for moderate pain.    Marland Kitchen loratadine (CLARITIN) 10 MG tablet Take 10 mg by mouth daily.    . montelukast (SINGULAIR) 10 MG tablet Take 10 mg by mouth at bedtime.     . polyethylene glycol (MIRALAX / GLYCOLAX) 17 g packet Take 17 g by mouth 2 (two) times daily.     . QUEtiapine Fumarate (SEROQUEL XR) 150 MG 24 hr tablet Take 150 mg by mouth at bedtime.    Marland Kitchen rOPINIRole (REQUIP) 1 MG tablet Take 1 tablet (1 mg total) by mouth at bedtime. 30 tablet 1  . SPIRIVA RESPIMAT 2.5 MCG/ACT AERS INHALE 2 PUFFS INTO THE LUNGS DAILY (Patient taking differently: Inhale 2 puffs into the lungs daily. ) 1 Inhaler 0  . tiZANidine (ZANAFLEX) 4 MG tablet Take 4 mg by mouth 2 (two) times daily as  needed for muscle spasms.     . traMADol (ULTRAM) 50 MG tablet Take 50 mg by mouth every 8 (eight) hours as needed (pain).     . triamcinolone ointment (KENALOG) 0.5 % Apply 1 application topically 2 (two) times daily. 30 g 1  . triamcinolone ointment (KENALOG) 0.5 % Apply 1 application topically 2 (two) times daily.    Marland Kitchen venlafaxine XR (EFFEXOR-XR) 150 MG 24 hr capsule Take 150 mg by mouth daily.    Marland Kitchen VITAMIN A PO Take 1 tablet by mouth daily.    Marland Kitchen albuterol (PROVENTIL) (2.5 MG/3ML) 0.083% nebulizer solution Take 3 mLs (2.5 mg total) by nebulization every 4 (four) hours as needed for wheezing or shortness of breath. (Patient not taking: Reported on 01/09/2020) 30 mL 0  . doxycycline (VIBRA-TABS) 100 MG tablet Take 1 tablet (100 mg total) by mouth every 12 (twelve) hours for 11 doses. (Patient not taking: Reported on 01/09/2020) 11 tablet 0  . hydrOXYzine (ATARAX/VISTARIL) 25 MG tablet Take 1 tablet (25 mg total) by mouth every 6 (six) hours. (Patient not taking: Reported on 01/09/2020) 12 tablet 0     Discharge Medications: Please see discharge summary for a list of discharge medications.  Relevant Imaging Results:  Relevant Lab Results:   Additional Information SSN#  [id number]  Madia Carvell C Tarpley-Carter, LCSWA

## 2020-01-10 NOTE — ED Notes (Signed)
Pt in room watching tv, blanket provided.

## 2020-01-10 NOTE — ED Notes (Signed)
Pt covered in crystalized urine. Pt cleaned, linen changed, and hooked up to monitor.

## 2020-01-10 NOTE — ED Notes (Signed)
Applied body cream on pt feet. Was given ice water.

## 2020-01-10 NOTE — Social Work (Signed)
CSW began [place] authorization.  [employer] reference # [id number].  PASSAR application has been started, waiting on number.  CSW will continue to follow for dc needs.  Starkeisha Vanwinkle Tarpley-Carter, MSW, [place] ED Transitions of Engineer, building services Health [phone]

## 2020-01-10 NOTE — ED Notes (Signed)
Pure wick has been placed. Suction set to 34mmHg.   Bed bath has been given and pure wick has been changed. Pt placed in new gown

## 2020-01-10 NOTE — Evaluation (Signed)
Physical Therapy Evaluation Patient Details Name: [name] MRN: [id number] DOB: [birth year] Today's Date: 01/10/2020   History of Present Illness  [name] is a 74 y.o. 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 Per EMS pt called out due to pain in her R hip and bilateral elbows after sliding from bed. Recently at [facility], fell fromm WC and went to ED 7/6. Declined return to SNF, [facility] home 7/10.  Clinical Impression  The patient found on stretcher with LUE fallen through rails and left leg partially through rails. While repositioning , patient found with crystals all along left side of body. RN notified and presumed to be dried urine. Patient assisted with rolling to have linens changed and washed up. Unable to test sitting as nursing continued washing patient. Patient will require assistance for all aADL's. Pt admitted with above diagnosis. Pt currently with functional limitations due to the deficits listed below (see PT Problem List). Pt will benefit from skilled PT to increase their independence and safety with mobility to allow discharge to the venue listed below.       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      Mobility  Bed Mobility   Bed Mobility: Rolling Rolling: Total assist;+2 for physical assistance;+2 for safety/equipment         General bed mobility comments: unable to assess sitting due to need to be washed  up.  Transfers                 General transfer comment: NT  Ambulation/Gait                Stairs            Wheelchair Mobility    Modified Rankin (Stroke Patients Only)       Balance                                             Pertinent Vitals/Pain Pain Location: left wrist and shoulder Pain Descriptors / Indicators: Grimacing;Guarding Pain Intervention(s): Limited activity within patient's tolerance;Monitored during session    Home Living Family/patient expects to be discharged to:: Skilled nursing facility                 Additional Comments: needing assist with all ADLs and utilizing wc for mobility    Prior Function     Gait / Transfers Assistance Needed: has been WC dependent.  ADL's / Homemaking Assistance Needed: Pt reports needing assist with all ADLs        Hand Dominance        Extremity/Trunk Assessment   Upper Extremity Assessment LUE Deficits / Details: flaccid entire arm    Lower Extremity Assessment LLE Deficits / Details: left knee contracted at about 30 degrees flexion, gross  flexion vs. increased tone  Communication   Communication: No difficulties  Cognition Arousal/Alertness: Awake/alert Behavior During Therapy: WFL for tasks assessed/performed Overall Cognitive Status: Difficult to assess                       Memory: Decreased short-term memory   Safety/Judgement: Decreased awareness of safety Awareness: Emergent   General Comments: oriented to place and month and year and reason to ED.      General Comments      Exercises     Assessment/Plan    PT Assessment Patient needs continued PT services  PT Problem List Decreased strength;Decreased range of motion;Decreased activity tolerance;Decreased balance;Decreased mobility;Decreased coordination;Decreased knowledge of use of DME;Decreased cognition;Decreased safety awareness;Decreased knowledge of  precautions;Pain;Impaired tone       PT Treatment Interventions DME instruction;Functional mobility training;Therapeutic activities;Therapeutic exercise;Neuromuscular re-education;Cognitive remediation;Patient/family education;Wheelchair mobility training    PT Goals (Current goals can be found in the Care Plan section)  Acute Rehab PT Goals Patient Stated Goal: to be able to get up and eat PT Goal Formulation: With patient Time For Goal Achievement: 01/24/20 Potential to Achieve Goals: Fair    Frequency Min 2X/week   Barriers to discharge Decreased caregiver support      Co-evaluation               AM-PAC PT "6 Clicks" Mobility  Outcome Measure Help needed turning from your back to your side while in a flat bed without using bedrails?: Total Help needed moving from lying on your back to sitting on the side of a flat bed without using bedrails?: Total Help needed moving to and from a bed to a chair (including a wheelchair)?: Total Help needed standing up from a chair using your arms (e.g., wheelchair or bedside chair)?: Total Help needed to walk in hospital room?: Total Help needed climbing 3-5 steps with a railing? : Total 6 Click Score: 6    End of Session   Activity Tolerance: Patient tolerated treatment well Patient left: in bed;with nursing/sitter in room Nurse Communication: Mobility status;Need for lift equipment PT Visit Diagnosis: Hemiplegia and hemiparesis;Muscle weakness (generalized) (M62.81);Repeated falls (R29.6) Hemiplegia - Right/Left: Left Hemiplegia - dominant/non-dominant: Non-dominant Hemiplegia - caused by: Cerebral infarction    Time: 0818-0850 PT Time Calculation (min) (ACUTE ONLY): 32 min   Charges:   PT Evaluation $PT Eval Moderate Complexity: 1 Mod PT Treatments $Therapeutic Activity: 8-22 mins        Tresa Endo PT Acute Rehabilitation Services Pager [phone] Office [phone]   Claretha Cooper 01/10/2020, 9:09  AM

## 2020-01-11 ENCOUNTER — Telehealth: Payer: Self-pay | Admitting: *Deleted

## 2020-01-11 DIAGNOSIS — M25521 Pain in right elbow: Secondary | ICD-10-CM | POA: Diagnosis not present

## 2020-01-11 NOTE — Progress Notes (Signed)
CSW received a call from [place] from [facility] stating the patient has been offered a bed and has been accepted and that the pt can arrive on 7/15 .  The pt's accepting doctor is SNF MD.  The room number will be 104.  The number for report is [phone]  CSW will update RN and EDP.  Alphonse Guild. Travor Royce, Latanya Presser, LCAS Clinical Social Worker Ph: [phone]

## 2020-01-11 NOTE — Discharge Instructions (Addendum)
1.  Continue care as per primary treating physician. 2.  Evaluate for physical therapy and occupational therapy.

## 2020-01-11 NOTE — ED Notes (Signed)
Pt was given lunch tray. Pt repositioned in bed.

## 2020-01-11 NOTE — ED Notes (Signed)
Pt repositioned in bed. Pt given heat packs for pain relief. Pt states no other needs at this time.

## 2020-01-11 NOTE — Progress Notes (Addendum)
RN: Please don't call report until AVS is faxed to the [facility] by the [place] who is at ph: [phone]  2nd shift ED CSW received a handoff from the 1st shift [facility] ED CSW.   Pt receuved pt's ins auth from [facility]:  [id number]  Pt ws approved for 5 days with review date of 7/19.  [place] Coordinator ARIST DAGOUT   Faxes go to: [phone]  PASRR: Good until 8/14: [id number]  CSW will continue to follow for D/C needs.  Alphonse Guild. Nikeshia Keetch  MSW, LCSW, LCAS, CCS Transitions of Care Clinical Social Worker Care Coordination Department Ph: [phone]

## 2020-01-11 NOTE — ED Notes (Signed)
Pt still sleeping, VS WNL, equal chest rise noted.

## 2020-01-11 NOTE — ED Provider Notes (Signed)
Patient has been awaiting SNF placement with history of multiple medical comorbid conditions and frequent falls unable to care for herself in her home at this time.  More acutely, patient has had a fall causing pain in her right hip and elbows limiting her baseline function and ADLs due to pain and gait dysfunction with increased fall risk.  Patient needs 30 days or less skilled nursing facility for rehabilitation.    Charlesetta Shanks, MD 01/11/20 1054

## 2020-01-11 NOTE — ED Notes (Addendum)
Attempted to call report to receiving facility. Per facility representative, AVS needed prior to receiving report. LCSW made aware.

## 2020-01-11 NOTE — Progress Notes (Addendum)
TOC CM spoke contacted [facility], they have not received AVS. TOC CM faxed AVS to [facility]. [place], [place] ED TOC CM [phone]

## 2020-01-12 ENCOUNTER — Ambulatory Visit: Payer: Medicare Other | Admitting: Radiation Oncology

## 2020-01-16 ENCOUNTER — Telehealth: Payer: Self-pay | Admitting: Radiation Therapy

## 2020-01-16 NOTE — Telephone Encounter (Signed)
I called to see how [name] is doing since her discharge to [facility], the nurse said that she is doing well overall, but still has some oozing from her scalp wound. She is continuing to take antibiotics for the course. I also called Dr. Colleen Can office and left a voicemail with his secretary since he wanted to see her in the office for a recheck before releasing her to complete her radiation appointments. Once she has seen him in the office, hopefully I will be able to get her back on the schedule to complete her radiation course.   Mont Dutton R.T.(R)(T) Radiation Special Procedures Navigator

## 2020-01-17 ENCOUNTER — Telehealth: Payer: Self-pay | Admitting: Hospice

## 2020-01-17 ENCOUNTER — Other Ambulatory Visit: Payer: Self-pay | Admitting: Internal Medicine

## 2020-01-17 DIAGNOSIS — B20 Human immunodeficiency virus [HIV] disease: Secondary | ICD-10-CM

## 2020-01-17 NOTE — Telephone Encounter (Signed)
Phone call placed to patient to offer to schedule a visit with [facility]. Phone rang, with no answer and voicemail box was full.

## 2020-01-17 NOTE — Progress Notes (Signed)
Palliative care referral placed for community [facility].

## 2020-02-12 ENCOUNTER — Telehealth: Payer: Self-pay | Admitting: Radiation Therapy

## 2020-02-12 NOTE — Telephone Encounter (Signed)
Pt seen by Dr. Zada Finders on 8/12 for a scalp wound check. Unfortunately this area is still not healed, he will recheck again on 9/2 to see if she is ready to resume radiation. When Ms. [name] is cleared for radiation, she will need a new planning MRI done for accurate treatment planning and delivery for the remaining two fractions.   Mont Dutton R.T(R)(T) Radiation Special Procedures Navigator

## 2020-02-13 ENCOUNTER — Other Ambulatory Visit: Payer: Self-pay

## 2020-02-13 ENCOUNTER — Non-Acute Institutional Stay: Payer: Medicare Other | Admitting: Hospice

## 2020-02-13 DIAGNOSIS — Z515 Encounter for palliative care: Secondary | ICD-10-CM

## 2020-02-13 NOTE — Progress Notes (Signed)
[facility] Consult Note Telephone: [phone]  Fax: [phone]  PATIENT NAME: [name] DOB: 17-Apr-1946 MRN: [id number]  PRIMARY CARE PROVIDER:   Nolene Ebbs, MD  REFERRING PROVIDER: Martinique Blattenberger, NP  RESPONSIBLE PARTY: Self [phone]  contact BJ Waddell brother [phone]    RECOMMENDATIONS/PLAN:   Advance Care Planning/Goals of Care: Visit at the request of Martinique Blattenberger, NP for palliative consult. Visit consisted of building trust and discussions on Palliative Medicine as specialized medical care for people living with serious illness, aimed at facilitating better quality of life through symptoms relief, assisting with advance care plan and establishing goals of care.  Visit consisted of counseling and education dealing with the complex and emotionally intense issues of symptom management and palliative care in the setting of serious and potentially life-threatening illness. Patient endorsed palliative services and expressed appreciation for education provided.  NP called [facility] and his number was 'temporarily unavailable' Code status: After discussion with patient on ramifications  of CODE STATUS,  patient affirmed she is a DO NOT RESUSCITATE.  Patient's decision discussed with Doctor, hospital and nursing staff [place].  NP signed DNR form for facility records, handed to [place]; same document uploaded to epic today. Goals of care:Goals of care include to maximize quality of life and symptom management. Palliative care will continue to provide support to patient, family and the medical team. Follow up: Palliative care will continue to follow patient for goals of care clarification and symptom management. Symptom management  PT/OT completed with for gait dysfunction.  Patient is non ambulatory at this time; left-sided weakness related to recent CVA. Patient has Ultram and oxycodone for right hip/bilateral elbow  pain-effective No recent COPD exacerbation; she is compliant with her breathing treatments.  On oxygen 2 L/min continuous. She denies coughing/shortness of breath.  She continues on Eliquis for history of pulmonary embolism full For major depressive disorder patient is currently on Duloxetine, quetiapine, venlafaxine. Patient is incontinent of bladder and bowel.  Nursing staff with no concerns; patient in no medical acuity.  Encouraged ongoing care.  I spent 1 hour and 30 minutes providing this consultation; time iincludes time spent with patient/family, chart review, provider coordination,  and documentation. More than 50% of the time in this consultation was spent on coordinating communication  HISTORY OF PRESENT ILLNESS:  [name] is a 74 y.o. 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  Palliative Care was asked to help address goals of care.   CODE STATUS: Full  PPS: 30% HOSPICE ELIGIBILITY/DIAGNOSIS: TBD  PAST MEDICAL HISTORY:  Past Medical History:  Diagnosis Date  . Asthma   . Atypical squamous cells of undetermined significance (ASCUS) on Papanicolaou smear of cervix   . Bed bug bite 07/12/2019  . Cancer ([place])   . COPD (chronic obstructive pulmonary disease) ([place])   . H/O drainage of abscess    left axilla, from left breast  . Hep B w/o coma   . HIV (human immunodeficiency virus infection) ([place])   . Hyperlipidemia   . Lung cancer ([place]) 12/14/13   LUL Adenocarcinoma  . Neuromuscular disorder ([place])    fingers/feet neuropathy  . Non-small cell lung cancer ([place])   .  Renal disorder   . S/P radiation therapy 01/26/14-02/02/14   sbrt  lt upper  lung-54Gy/20fx  . Stroke [facility])     SOCIAL HX:  Social History   Tobacco Use  . Smoking status: Never Smoker  . Smokeless tobacco: Never Used  . Tobacco comment: quit date the day she starts her radiation at the [facility]  Substance Use Topics  . Alcohol use: Not Currently    Comment: none in 20 years    ALLERGIES: No Known Allergies   PERTINENT MEDICATIONS:  Outpatient Encounter Medications as of 02/13/2020  Medication Sig  . albuterol (PROVENTIL) (2.5 MG/3ML) 0.083% nebulizer solution Take 3 mLs (2.5 mg total) by nebulization every 4 (four) hours as needed for wheezing or shortness of breath. (Patient not taking: Reported on 01/09/2020)  . albuterol (PROVENTIL) (2.5 MG/3ML) 0.083% nebulizer solution Take 2.5 mg by nebulization every 4 (four) hours as needed for wheezing or shortness of breath.  Marland Kitchen albuterol (VENTOLIN HFA) 108 (90 Base) MCG/ACT inhaler Inhale 1 puff into the lungs every 6 (six) hours as needed for shortness of breath.   Marland Kitchen apixaban (ELIQUIS) 5 MG TABS tablet Take 1 tablet (5 mg total) by mouth 2 (two) times daily.  . Ascorbic Acid (VITAMIN C) 100 MG tablet Take 100 mg by mouth daily.  . B Complex-Biotin-FA (VITAMIN B50 COMPLEX PO) Take 1 tablet by mouth daily.  . bictegravir-emtricitabine-tenofovir AF (BIKTARVY) 50-200-25 MG TABS tablet Take 1 tablet by mouth daily.  . diclofenac Sodium (VOLTAREN) 1 % GEL Apply 4 g topically 2 (two) times daily as needed for pain.  . DULoxetine (CYMBALTA) 20 MG capsule Take 20 mg by mouth daily.  . Fluticasone-Salmeterol (ADVAIR) 500-50 MCG/DOSE AEPB Inhale 1 puff into the lungs 2 (two) times daily.  Marland Kitchen gabapentin (NEURONTIN) 800 MG tablet Take 800 mg by mouth 3 (three) times daily.  . hydrOXYzine (ATARAX/VISTARIL) 25 MG tablet Take 1 tablet (25 mg total) by mouth every 6 (six) hours. (Patient not taking: Reported on 01/09/2020)  . hydrOXYzine (ATARAX/VISTARIL) 25 MG tablet Take 25 mg by mouth every 6 (six) hours.  Marland Kitchen ibuprofen (ADVIL)  200 MG tablet Take 800 mg by mouth every 6 (six) hours as needed for moderate pain.  Marland Kitchen loratadine (CLARITIN) 10 MG tablet Take 10 mg by mouth daily.  . montelukast (SINGULAIR) 10 MG tablet Take 10 mg by mouth at bedtime.   . polyethylene glycol (MIRALAX / GLYCOLAX) 17 g packet Take 17 g by mouth 2 (two) times daily.   . QUEtiapine Fumarate (SEROQUEL XR) 150 MG 24 hr tablet Take 150 mg by mouth at bedtime.  Marland Kitchen rOPINIRole (REQUIP) 1 MG tablet Take 1 tablet (1 mg total) by mouth at bedtime.  Marland Kitchen SPIRIVA RESPIMAT 2.5 MCG/ACT AERS INHALE 2 PUFFS INTO THE LUNGS DAILY (Patient taking differently: Inhale 2 puffs into the lungs daily. )  . tiZANidine (ZANAFLEX) 4 MG tablet Take 4 mg by mouth 2 (two) times daily as needed for muscle spasms.   . traMADol (ULTRAM) 50 MG tablet Take 50 mg by mouth every 8 (eight) hours as needed (pain).   . triamcinolone ointment (KENALOG) 0.5 % Apply 1 application topically 2 (two) times daily.  Marland Kitchen triamcinolone ointment (KENALOG) 0.5 % Apply 1 application topically 2 (two) times daily.  Marland Kitchen venlafaxine XR (EFFEXOR-XR) 150 MG 24 hr capsule Take 150 mg by mouth daily.  Marland Kitchen VITAMIN A PO Take 1 tablet by mouth daily.   No facility-administered encounter medications on file as of 02/13/2020.  PHYSICAL EXAM/ROS:  General: NAD, cooperative Cardiovascular: regular rate and rhythm; denies chest pain Pulmonary: On oxygen supplementation 3 L/min continuous, no shortness of breath normal respiratory effort Abdomen: soft, nontender, + bowel sounds GU: no suprapubic tenderness Extremities: left side weakness, left hand contracture,  no edema.Skin: Dry skin, no rashes to exposed skin Neurological: Weakness but otherwise nonfocal  Teodoro Spray, NP

## 2020-02-16 ENCOUNTER — Ambulatory Visit: Payer: Self-pay | Admitting: Radiation Oncology

## 2020-05-09 ENCOUNTER — Telehealth: Payer: Self-pay | Admitting: Hospice

## 2020-05-09 DIAGNOSIS — Z515 Encounter for palliative care: Secondary | ICD-10-CM

## 2020-05-09 NOTE — Telephone Encounter (Signed)
Return call to Manuela Schwartz at [facility] clarifying that patient is not a Hospice patient. Patient is followed by palliative care and recently changed her status to DNR. Voicemail left for Manuela Schwartz included my call cell phone number for call back if needed.

## 2020-05-14 ENCOUNTER — Other Ambulatory Visit: Payer: Self-pay

## 2020-05-14 ENCOUNTER — Non-Acute Institutional Stay: Payer: Medicare Other | Admitting: Hospice

## 2020-05-14 DIAGNOSIS — Z515 Encounter for palliative care: Secondary | ICD-10-CM

## 2020-05-14 DIAGNOSIS — D496 Neoplasm of unspecified behavior of brain: Secondary | ICD-10-CM

## 2020-05-14 NOTE — Progress Notes (Addendum)
[facility] Consult Note Telephone: [phone]  Fax: [phone]  PATIENT NAME: [name] DOB: August 27, 1945 MRN: [id number]  PRIMARY CARE PROVIDER:   Nolene Ebbs, MD Nolene Ebbs, MD [address] [place],  [place] [address]  REFERRING PROVIDER: Nolene Ebbs, MD Nolene Ebbs, MD [address] [place],  [place] [address]  REFERRING PROVIDER: Martinique Blattenberger, NP  RESPONSIBLE PARTY: Self -[phone]  contact [name] brother [phone]    RECOMMENDATIONS/PLAN:   Advance Care Planning/Goals of Care: Visit consisted of building trust and discussions on Palliative Medicine as specialized medical care for people living with serious illness, aimed at facilitating better quality of life through symptoms relief, assisting with advance care plan and establishing goals of care.   Code status: CODE STATUS reviewed.  patient affirmed she is a DO NOT RESUSCITATE.   Goals of care:Goals of care include to maximize quality of life and symptom management. Palliative care will continue to provide support to patient, family and the medical team. Visit consisted of counseling and education dealing with the complex and emotionally intense issues of symptom management and palliative care in the setting of serious and potentially life-threatening illness. Palliative care team will continue to support patient, patient's family, and medical team. Follow up: Palliative care will continue to follow patient for goals of care clarification and symptom management.  Follow-up in 2 months/as needed Symptom management: Pain: Continue Ultram, Voltaren gel for right hip/bilateral elbow pain - effective. Wound to the top of head has scabbed over; facility wound doctor signed off as healed. Patient willing to resume radiation. Discussed with facility scheduler Tammy to set up appointment for patient at [facility] center. She called and left voicemail  with call back number.  NP called and updated Manuela Schwartz with [facility] on patient's readiness to resume radiation via voicemail, with call back number.  Continue Doxycycline for right underarm boil.  Patient is non ambulatory at this time; left-sided weakness related to recent CVA.  Continue range of motion, activities as tolerated, balance of rest and performance activity  Patient denies respiratory distress/cough  On oxygen 2 L/min continuous. She denies coughing/shortness of breath.  She continues on Eliquis for history of pulmonary embolism full She continues on Duloxetine, quetiapine, venlafaxine for major depressive disorder.  Nursing staff with no concerns; patient in no medical acuity.  Encouraged ongoing care.  I spent 1 hour and 16 minutes providing this consultation; time iincludes time spent with patient/family, chart review, provider coordination,  and documentation. More than 50% of the time in this consultation was spent on coordinating communication  HISTORY OF PRESENT ILLNESS:  [name] is a 74 y.o. 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  Palliative Care was asked to help address goals of care.   CODE STATUS: Full  PPS: 30%  HOSPICE ELIGIBILITY/DIAGNOSIS: TBD  PAST MEDICAL HISTORY:  Past Medical History:  Diagnosis Date  . Asthma   . Atypical squamous cells of undetermined significance (ASCUS) on Papanicolaou smear of cervix   . Bed bug bite 07/12/2019  . Cancer ([place])   . COPD (chronic obstructive pulmonary disease) ([place])   . H/O drainage of abscess    left axilla,  from left breast  . Hep B w/o coma   . HIV (human  immunodeficiency virus infection) ([place])   . Hyperlipidemia   . Lung cancer ([place]) 12/14/13   LUL Adenocarcinoma  . Neuromuscular disorder ([place])    fingers/feet neuropathy  . Non-small cell lung cancer ([place])   . Renal disorder   . S/P radiation therapy 01/26/14-02/02/14   sbrt  lt upper lung-54Gy/58fx  . Stroke [facility])     SOCIAL HX:  Social History   Tobacco Use  . Smoking status: Never Smoker  . Smokeless tobacco: Never Used  . Tobacco comment: quit date the day she starts her radiation at the [facility]  Substance Use Topics  . Alcohol use: Not Currently    Comment: none in 20 years    ALLERGIES: No Known Allergies   PERTINENT MEDICATIONS:  Outpatient Encounter Medications as of 05/14/2020  Medication Sig  . albuterol (PROVENTIL) (2.5 MG/3ML) 0.083% nebulizer solution Take 3 mLs (2.5 mg total) by nebulization every 4 (four) hours as needed for wheezing or shortness of breath. (Patient not taking: Reported on 01/09/2020)  . albuterol (PROVENTIL) (2.5 MG/3ML) 0.083% nebulizer solution Take 2.5 mg by nebulization every 4 (four) hours as needed for wheezing or shortness of breath.  Marland Kitchen albuterol (VENTOLIN HFA) 108 (90 Base) MCG/ACT inhaler Inhale 1 puff into the lungs every 6 (six) hours as needed for shortness of breath.   Marland Kitchen apixaban (ELIQUIS) 5 MG TABS tablet Take 1 tablet (5 mg total) by mouth 2 (two) times daily.  . Ascorbic Acid (VITAMIN C) 100 MG tablet Take 100 mg by mouth daily.  . B Complex-Biotin-FA (VITAMIN B50 COMPLEX PO) Take 1 tablet by mouth daily.  . bictegravir-emtricitabine-tenofovir AF (BIKTARVY) 50-200-25 MG TABS tablet Take 1 tablet by mouth daily.  . diclofenac Sodium (VOLTAREN) 1 % GEL Apply 4 g topically 2 (two) times daily as needed for pain.  . DULoxetine (CYMBALTA) 20 MG capsule Take 20 mg by mouth daily.  . Fluticasone-Salmeterol (ADVAIR) 500-50 MCG/DOSE AEPB Inhale 1 puff into the lungs 2 (two) times daily.  Marland Kitchen gabapentin (NEURONTIN) 800 MG tablet Take 800 mg  by mouth 3 (three) times daily.  . hydrOXYzine (ATARAX/VISTARIL) 25 MG tablet Take 1 tablet (25 mg total) by mouth every 6 (six) hours. (Patient not taking: Reported on 01/09/2020)  . hydrOXYzine (ATARAX/VISTARIL) 25 MG tablet Take 25 mg by mouth every 6 (six) hours.  Marland Kitchen ibuprofen (ADVIL) 200 MG tablet Take 800 mg by mouth every 6 (six) hours as needed for moderate pain.  Marland Kitchen loratadine (CLARITIN) 10 MG tablet Take 10 mg by mouth daily.  . montelukast (SINGULAIR) 10 MG tablet Take 10 mg by mouth at bedtime.   . polyethylene glycol (MIRALAX / GLYCOLAX) 17 g packet Take 17 g by mouth 2 (two) times daily.   . QUEtiapine Fumarate (SEROQUEL XR) 150 MG 24 hr tablet Take 150 mg by mouth at bedtime.  Marland Kitchen rOPINIRole (REQUIP) 1 MG tablet Take 1 tablet (1 mg total) by mouth at bedtime.  Marland Kitchen SPIRIVA RESPIMAT 2.5 MCG/ACT AERS INHALE 2 PUFFS INTO THE LUNGS DAILY (Patient taking differently: Inhale 2 puffs into the lungs daily. )  . tiZANidine (ZANAFLEX) 4 MG tablet Take 4 mg by mouth 2 (two) times daily as needed for muscle spasms.   . traMADol (ULTRAM) 50 MG tablet Take 50 mg by mouth every 8 (eight) hours as needed (pain).   . triamcinolone ointment (KENALOG) 0.5 % Apply 1 application topically 2 (two) times daily.  Marland Kitchen  triamcinolone ointment (KENALOG) 0.5 % Apply 1 application topically 2 (two) times daily.  Marland Kitchen venlafaxine XR (EFFEXOR-XR) 150 MG 24 hr capsule Take 150 mg by mouth daily.  Marland Kitchen VITAMIN A PO Take 1 tablet by mouth daily.   No facility-administered encounter medications on file as of 05/14/2020.    PHYSICAL EXAM/ROS:  General: In no acute distress, cooperative, Head: wound to top of head scabbed Cardiovascular: regular rate and rhythm; denies chest pain Pulmonary: clear ant/post fields; oxygen supplementation at 2 L/min Abdomen: soft, nontender, + bowel sounds GU: no suprapubic tenderness Extremities: no edema to BLE; boil to left underarm-antibiotic is ongoing; left-sided weakness Skin: no rashes  to exposed skin Neurological: Weakness but otherwise nonfocal  Teodoro Spray, NP

## 2020-05-16 ENCOUNTER — Telehealth: Payer: Self-pay | Admitting: Radiation Therapy

## 2020-05-16 NOTE — Telephone Encounter (Addendum)
I spoke with the scheduler at [facility], about Ms. Benfer's upcoming follow-up visit with Dr. Lisbeth Renshaw on 11/24, SIM on 11/29, and MRI on 11/30. She stated that the appointment information has been added to the book for transportation set up and requested that we also share these appointments with Ms. Copelin son. He is planning to come with Katy Apo for her follow-up on 11/24.   Mont Dutton R.T.(R)(T) Radiation Special Procedures Navigator

## 2020-05-17 ENCOUNTER — Other Ambulatory Visit: Payer: Self-pay | Admitting: Radiation Therapy

## 2020-05-17 DIAGNOSIS — C7931 Secondary malignant neoplasm of brain: Secondary | ICD-10-CM

## 2020-05-17 DIAGNOSIS — C7949 Secondary malignant neoplasm of other parts of nervous system: Secondary | ICD-10-CM

## 2020-05-22 ENCOUNTER — Ambulatory Visit
Admission: RE | Admit: 2020-05-22 | Discharge: 2020-05-22 | Disposition: A | Discharge status: Home / Self Care | Payer: Medicare Other | Source: Ambulatory Visit | Attending: Radiation Oncology | Admitting: Radiation Oncology

## 2020-05-22 ENCOUNTER — Encounter: Payer: Self-pay | Admitting: Radiation Oncology

## 2020-05-22 ENCOUNTER — Other Ambulatory Visit: Payer: Self-pay

## 2020-05-22 VITALS — BP 95/59 | HR 87 | Temp 97.6°F | Resp 18 | Ht 69.0 in

## 2020-05-22 DIAGNOSIS — Z79899 Other long term (current) drug therapy: Secondary | ICD-10-CM | POA: Diagnosis not present

## 2020-05-22 DIAGNOSIS — C3412 Malignant neoplasm of upper lobe, left bronchus or lung: Secondary | ICD-10-CM | POA: Insufficient documentation

## 2020-05-22 DIAGNOSIS — C7931 Secondary malignant neoplasm of brain: Secondary | ICD-10-CM | POA: Diagnosis not present

## 2020-05-22 DIAGNOSIS — Z7901 Long term (current) use of anticoagulants: Secondary | ICD-10-CM | POA: Insufficient documentation

## 2020-05-22 DIAGNOSIS — Z510 Encounter for antineoplastic radiation therapy: Secondary | ICD-10-CM | POA: Insufficient documentation

## 2020-05-22 LAB — BUN & CREATININE (CHCC)
BUN: 24 mg/dL — ABNORMAL HIGH (ref 8–23)
Creatinine: 1.11 mg/dL — ABNORMAL HIGH (ref 0.44–1.00)
GFR, Estimated: 53 mL/min — ABNORMAL LOW (ref >60)

## 2020-05-22 NOTE — Progress Notes (Signed)
Radiation Oncology         ([phone]) [phone] ________________________________  Name: [name] MRN: [id number]  Date: 05/22/2020  DOB: 01/18/1946  Follow-Up Visit Note  CC: Nolene Ebbs, MD  [name], MD  Diagnosis:   Brain metastasis  Narrative:  The patient returns today for routine follow-up.  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  She underwent a gross total resection and was in the process of receiving fractionated radiosurgery postoperatively.  However she had some wound healing issues and her treatment was interrupted.  She returns to clinic for reevaluation.                              ALLERGIES:  has No Known Allergies.  Meds: Current Outpatient Medications  Medication Sig Dispense Refill  . albuterol (PROVENTIL) (2.5 MG/3ML) 0.083% nebulizer solution Take 3 mLs (2.5 mg total) by nebulization every 4 (four) hours as needed for wheezing or shortness of breath. 30 mL 0  . albuterol (PROVENTIL) (2.5 MG/3ML) 0.083% nebulizer solution Take 2.5 mg by nebulization every 4 (four) hours as needed for wheezing or shortness of breath.    Marland Kitchen albuterol (VENTOLIN HFA) 108 (90 Base) MCG/ACT inhaler Inhale 1 puff into the lungs every 6 (six) hours as needed for shortness of breath.     Marland Kitchen apixaban (ELIQUIS) 5 MG TABS tablet Take 1 tablet (5 mg total) by mouth 2 (two) times daily. 60 tablet 1  . Ascorbic Acid (VITAMIN C) 100 MG tablet Take 100 mg by mouth daily.    . B Complex-Biotin-FA (VITAMIN B50 COMPLEX PO) Take 1 tablet by mouth daily.    . bictegravir-emtricitabine-tenofovir AF (BIKTARVY) 50-200-25 MG TABS tablet Take 1 tablet by mouth daily. 30 tablet 11  . diclofenac Sodium (VOLTAREN) 1 % GEL Apply 4 g topically 2 (two) times daily as needed for pain.    . DULoxetine (CYMBALTA) 20 MG capsule Take 20 mg by mouth daily.    . Fluticasone-Salmeterol (ADVAIR) 500-50 MCG/DOSE  AEPB Inhale 1 puff into the lungs 2 (two) times daily.    Marland Kitchen gabapentin (NEURONTIN) 800 MG tablet Take 800 mg by mouth 3 (three) times daily.    . hydrOXYzine (ATARAX/VISTARIL) 25 MG tablet Take 1 tablet (25 mg total) by mouth every 6 (six) hours. 12 tablet 0  . hydrOXYzine (ATARAX/VISTARIL) 25 MG tablet Take 25 mg by mouth every 6 (six) hours.    Marland Kitchen ibuprofen (ADVIL) 200 MG tablet Take 800 mg by mouth every 6 (six) hours as needed for moderate pain.    Marland Kitchen loratadine (CLARITIN) 10 MG tablet Take 10 mg by mouth daily.    . montelukast (SINGULAIR) 10 MG tablet Take 10 mg by mouth at bedtime.     . polyethylene glycol (MIRALAX / GLYCOLAX) 17 g packet Take 17 g by mouth 2 (two) times daily.     . QUEtiapine Fumarate (SEROQUEL XR) 150 MG 24 hr tablet Take 150 mg by mouth at bedtime.    Marland Kitchen rOPINIRole (REQUIP) 1 MG tablet Take 1 tablet (1 mg total) by mouth at bedtime. 30 tablet 1  . SPIRIVA RESPIMAT 2.5 MCG/ACT AERS INHALE 2 PUFFS INTO THE LUNGS DAILY (Patient taking differently: Inhale 2 puffs into the lungs daily. ) 1 Inhaler 0  . tiZANidine (ZANAFLEX) 4 MG tablet Take 4 mg by mouth 2 (two) times daily as  needed for muscle spasms.     . traMADol (ULTRAM) 50 MG tablet Take 50 mg by mouth every 8 (eight) hours as needed (pain).     . triamcinolone ointment (KENALOG) 0.5 % Apply 1 application topically 2 (two) times daily. 30 g 1  . triamcinolone ointment (KENALOG) 0.5 % Apply 1 application topically 2 (two) times daily.    Marland Kitchen venlafaxine XR (EFFEXOR-XR) 150 MG 24 hr capsule Take 150 mg by mouth daily.    Marland Kitchen VITAMIN A PO Take 1 tablet by mouth daily.    Marland Kitchen triamcinolone (KENALOG) 0.1 % Apply topically 2 (two) times daily as needed.     No current facility-administered medications for this encounter.    Physical Findings: The patient is in no acute distress. Patient is alert and oriented.  height is 5\' 9"  (1.753 m). Her temporal temperature is 97.6 F (36.4 C). Her blood pressure is 95/59 (abnormal) and  her pulse is 87. Her respiration is 18 and oxygen saturation is 95%. .   The scalp is well-healed now without any sign of infection.  Incision well-healed.  Lab Findings: Lab Results  Component Value Date   WBC 12.1 (H) 01/09/2020   HGB 15.1 (H) 01/09/2020   HCT 48.5 (H) 01/09/2020   MCV 89.0 01/09/2020   PLT 423 (H) 01/09/2020     Radiographic Findings: No results found.  Impression/plan:    The patient is suitable to resume her course of radiosurgery.  We have ordered a repeat MRI scan given the timeframe since her last imaging and discussed resuming a fractionated course of radiosurgery.  The exact dose and fractionation will depend on her upcoming MRI scan.  She has also been scheduled for a simulation next week for treatment planning.  The patient was seen in person today in clinic.  The total time spent on the patient's visit today was 25 min, including chart review, direct discussion/evaluation with the patient, and coordination of care.     Jodelle Gross, M.D., Ph.D.

## 2020-05-27 ENCOUNTER — Ambulatory Visit: Payer: Medicare Other

## 2020-05-27 ENCOUNTER — Telehealth: Payer: Self-pay | Admitting: Radiation Therapy

## 2020-05-27 ENCOUNTER — Ambulatory Visit: Payer: Medicare Other | Admitting: Radiation Oncology

## 2020-05-27 NOTE — Telephone Encounter (Signed)
Called and left a message for the director of nursing at [facility] about Ms. Heyward's rescheduled simulation appointment and to also ensure they have her MRI appointment for tomorrow on her transportation sheet.   Mont Dutton R.T.(R)(T) Radiation Special Procedures Navigator

## 2020-05-28 ENCOUNTER — Ambulatory Visit (HOSPITAL_COMMUNITY)
Admission: RE | Admit: 2020-05-28 | Discharge: 2020-05-28 | Disposition: A | Discharge status: Home / Self Care | Payer: Medicare Other | Source: Ambulatory Visit | Attending: Radiation Oncology | Admitting: Radiation Oncology

## 2020-05-28 ENCOUNTER — Ambulatory Visit
Admission: RE | Admit: 2020-05-28 | Discharge: 2020-05-28 | Disposition: A | Discharge status: Home / Self Care | Payer: Medicare Other | Source: Ambulatory Visit | Attending: Radiation Oncology | Admitting: Radiation Oncology

## 2020-05-28 ENCOUNTER — Other Ambulatory Visit: Payer: Self-pay

## 2020-05-28 VITALS — BP 95/68 | HR 81 | Temp 97.7°F | Resp 18

## 2020-05-28 DIAGNOSIS — C7931 Secondary malignant neoplasm of brain: Secondary | ICD-10-CM | POA: Insufficient documentation

## 2020-05-28 DIAGNOSIS — C7949 Secondary malignant neoplasm of other parts of nervous system: Secondary | ICD-10-CM | POA: Insufficient documentation

## 2020-05-28 DIAGNOSIS — Z510 Encounter for antineoplastic radiation therapy: Secondary | ICD-10-CM | POA: Diagnosis not present

## 2020-05-28 MED ORDER — SODIUM CHLORIDE 0.9% FLUSH
10.0000 mL | Freq: Once | INTRAVENOUS | Status: AC
  Administered 2020-05-28: 10 mL via INTRAVENOUS

## 2020-05-28 MED ORDER — GADOBUTROL 1 MMOL/ML IV SOLN
9.0000 mL | Freq: Once | INTRAVENOUS | Status: AC | PRN
  Administered 2020-05-28: 9 mL via INTRAVENOUS

## 2020-05-31 ENCOUNTER — Other Ambulatory Visit: Payer: Self-pay

## 2020-05-31 ENCOUNTER — Ambulatory Visit
Admission: RE | Admit: 2020-05-31 | Discharge: 2020-05-31 | Disposition: A | Discharge status: Home / Self Care | Payer: Medicare Other | Source: Ambulatory Visit | Attending: Radiation Oncology | Admitting: Radiation Oncology

## 2020-05-31 DIAGNOSIS — C3412 Malignant neoplasm of upper lobe, left bronchus or lung: Secondary | ICD-10-CM | POA: Diagnosis not present

## 2020-05-31 DIAGNOSIS — C7931 Secondary malignant neoplasm of brain: Secondary | ICD-10-CM | POA: Insufficient documentation

## 2020-05-31 DIAGNOSIS — Z510 Encounter for antineoplastic radiation therapy: Secondary | ICD-10-CM | POA: Diagnosis not present

## 2020-05-31 NOTE — Progress Notes (Signed)
[name] rested with [name] for 30 minutes following her SRS treatment.  Patient denies headache, dizziness, nausea, diplopia or ringing in the ears. Denies fatigue. Patient without complaints. Understands to avoid strenuous activity for the next 24 hours and call [phone] with needs. Facility called for transportation.  Patient rolled to main entrance via wheelchair.

## 2020-06-04 ENCOUNTER — Ambulatory Visit
Admission: RE | Admit: 2020-06-04 | Discharge: 2020-06-04 | Disposition: A | Discharge status: Home / Self Care | Payer: Medicare Other | Source: Ambulatory Visit | Attending: Radiation Oncology | Admitting: Radiation Oncology

## 2020-06-04 ENCOUNTER — Other Ambulatory Visit: Payer: Self-pay

## 2020-06-04 DIAGNOSIS — Z510 Encounter for antineoplastic radiation therapy: Secondary | ICD-10-CM | POA: Diagnosis not present

## 2020-06-04 NOTE — Progress Notes (Signed)
Mrs. Tahir rested with [name] for 15 minutes following her [place] treatment. Patient denies headache, dizziness, nausea, diplopia or ringing in the ears. Denies fatigue. Patient without complaints. Understands to avoid strenuous activity for the next 24 hours and call [phone] with needs. Transportation from facility picked patient up at main entrance.  Gloriajean Dell. Leonie Green, BSN

## 2020-06-06 ENCOUNTER — Ambulatory Visit
Admission: RE | Admit: 2020-06-06 | Discharge: 2020-06-06 | Disposition: A | Discharge status: Home / Self Care | Payer: Medicare Other | Source: Ambulatory Visit | Attending: Radiation Oncology | Admitting: Radiation Oncology

## 2020-06-06 ENCOUNTER — Encounter: Payer: Self-pay | Admitting: Radiation Oncology

## 2020-06-06 ENCOUNTER — Other Ambulatory Visit: Payer: Self-pay

## 2020-06-06 DIAGNOSIS — Z510 Encounter for antineoplastic radiation therapy: Secondary | ICD-10-CM | POA: Diagnosis not present

## 2020-07-08 ENCOUNTER — Ambulatory Visit
Admission: RE | Admit: 2020-07-08 | Discharge: 2020-07-08 | Disposition: A | Discharge status: Home / Self Care | Payer: Medicare Other | Source: Ambulatory Visit | Attending: Radiation Oncology | Admitting: Radiation Oncology

## 2020-07-08 ENCOUNTER — Other Ambulatory Visit: Payer: Self-pay

## 2020-07-08 DIAGNOSIS — C3492 Malignant neoplasm of unspecified part of left bronchus or lung: Secondary | ICD-10-CM

## 2020-07-08 DIAGNOSIS — C7931 Secondary malignant neoplasm of brain: Secondary | ICD-10-CM

## 2020-07-08 NOTE — Progress Notes (Signed)
  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  Interval Since Last Radiation:  5 weeks   05/31/20-06/06/20 SRS Treatment: The postop resection cavity was treated to 27 Gy in 3 fractions [id number], 5 VMAT beams  01/26/14-02/02/14 SBRT:  LUL was treated to 54 Gy in 3 fractions.  Narrative:  The patient was contacted today for routine follow-up.  In summary the patient had a history of a stage Ia lung cancer of the left upper lobe, it was an adenocarcinoma and she did well for many years following stereotactic body radiotherapy that she received for treatment of this in 2015.  She was being followed by Dr. [name] however was lost to follow-up for several years, and presented with altered mental status to the ER in May 2021, this led to the identification of a large dural based mass that was surgically removed by Dr. Venetia Constable on 11/28/2019 and treated with postoperative SRS.  Her treatment was delayed somewhat because of development of infection along her surgical site. She tolerated her SRS treatment well, and attempts were made this afternoon to contact her to check on her as well.  She does not have any additional appointment scheduled with medical oncology.    Impression/Plan: 1. 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 I was unable to reach the patient today. We  would be happy to continue to follow her as needed, but she will also need to continue to follow up with Dr. [name] in medical oncology and Dr. Mickeal Skinner in neuro oncology as he follows postoperative patients. I will reach out to our navigator to try to help coordinate her  appointments.     Carola Rhine, PAC

## 2020-07-10 ENCOUNTER — Other Ambulatory Visit: Payer: Self-pay | Admitting: Radiation Therapy

## 2020-07-10 DIAGNOSIS — C7931 Secondary malignant neoplasm of brain: Secondary | ICD-10-CM

## 2020-07-24 NOTE — Progress Notes (Signed)
  Radiation Oncology         [phone] ________________________________  Name: [name] MRN: [id number]  Date: 06/06/2020  DOB: [birth year]  End of Treatment Note  Diagnosis:   Brain metastasis     Indication for treatment:  palliative       Radiation treatment dates:   05/31/2020 through 06/06/2020  Site/dose:    The patient was treated to a dose of 22.5 Gray in 3 fractions corresponding to a course of radiosurgery.  This fractionation scheme was based on her previous single fraction to this area which was given before her previous treatment was interrupted.  This treatment was accomplished using a 4 field IMRT technique.  Narrative: The patient tolerated radiation treatment well.   There were no signs of acute toxicity after treatment.  Plan: The patient has completed radiation treatment. The patient will return to radiation oncology clinic for routine followup in one month. I advised the patient to call or return sooner if they have any questions or concerns related to their recovery or treatment. ________________________________  ------------------------------------------------  Jodelle Gross, MD, PhD

## 2020-08-09 ENCOUNTER — Other Ambulatory Visit: Payer: Self-pay

## 2020-08-09 ENCOUNTER — Non-Acute Institutional Stay: Payer: Medicare Other | Admitting: Hospice

## 2020-08-09 DIAGNOSIS — J449 Chronic obstructive pulmonary disease, unspecified: Secondary | ICD-10-CM

## 2020-08-09 DIAGNOSIS — R0602 Shortness of breath: Secondary | ICD-10-CM

## 2020-08-09 DIAGNOSIS — Z515 Encounter for palliative care: Secondary | ICD-10-CM

## 2020-08-09 DIAGNOSIS — D496 Neoplasm of unspecified behavior of brain: Secondary | ICD-10-CM

## 2020-08-09 NOTE — Progress Notes (Signed)
[facility] Consult Note Telephone: [phone]  Fax: [phone]  PATIENT NAME: [name] DOB: 1945/10/26 MRN: [id number]  PRIMARY CARE PROVIDER:   Nolene Ebbs, MD Nolene Ebbs, MD [address],  [place] [address]  REFERRING PROVIDER:Jordan Blattenberger, NP  RESPONSIBLE PARTY:Self [phone]  contact [place] [phone]    Visit is to build trust and highlight Palliative Medicine as specialized medical care for people living with serious illness, aimed at facilitating better quality of life through symptoms relief, assisting with advance care plan and establishing goals of care.   CHIEF COMPLAINT: Follow up palliative visit/shortness of breath  RECOMMENDATIONS/PLAN:   1. Advance Care Planning/Code Status: Code status reviewed. Patient is a Do not Resuscitate.   2. Goals of Care: Goals of care include to maximize quality of life and symptom management.  Visit consisted of counseling and education dealing with the complex and emotionally intense issues of symptom management and palliative care in the setting of serious and potentially life-threatening illness. Palliative care team will continue to support patient, patient's family, and medical team.  I spent 20 minutes providing this consultation. More than 50% of the time in this consultation was spent on coordinating communication.  -------------------------------------------------------------------------------------------------------------------------------------------------- 3. Symptom management/Plan:  Shortness of breath related to COPD.  Pursed lip breathing encouraged.  Continue Advair and Albuterol as currently ordered. O2 2L/MIn as needed for shortness of breath. Palliative will continue to monitor for symptom management/decline and make recommendations as needed. Return 2 months or prn. Encouraged to call provider sooner with any concerns.    HISTORY OF PRESENT ILLNESS:  [name] is a 75 y.o. female with multiple medical problems including shortness of breath related to COPD is chronic, worsened with exertion when patient tries to sit up; she has oxygen on hand which is helpful at 2L/Min which she uses intermittently as needed. History of CVA with residual right-sided deficits, HIV, PE on Eliquis, COPD, adenocarcinoma of the left upper lobe status post radiation recent brain mass positive for carcinoma;right craniotomy for resection of tumor 11/28/2019. Palliative Care was asked to help address goals of care.  History obtained from review of EMR, discussion with patient/family. Review and summarization of Epic records shows history from other than patient. Rest of 10 point ROS asked and negative.  Palliative Care was asked to follow this patient by consultation request of Nolene Ebbs, MD to help address complex decision making in the context of goals of care.   CODE STATUS: DNR  PPS: 30%  HOSPICE ELIGIBILITY/DIAGNOSIS: TBD  PAST MEDICAL HISTORY:  Past Medical History:  Diagnosis Date  . Asthma   . Atypical squamous cells of undetermined significance (ASCUS) on Papanicolaou smear of cervix   . Bed bug bite 07/12/2019  . Cancer ([place])   . COPD (chronic obstructive pulmonary disease) ([place])   . H/O drainage of abscess    left axilla, from left breast  . Hep B w/o coma   . HIV (human immunodeficiency virus infection) ([place])   . Hyperlipidemia   . Lung cancer ([place]) 12/14/13   LUL Adenocarcinoma  . Neuromuscular disorder ([place])    fingers/feet neuropathy  . Non-small cell lung cancer ([place])   . Renal disorder   . S/P radiation therapy 01/26/14-02/02/14   sbrt  lt upper lung-54Gy/75fx  . Stroke [facility])     SOCIAL HX: Patient in SNF for ongoing care   FAMILY HX:  Family History  Problem Relation Age of Onset  . Pneumonia  Mother   . Hypertension Sister   . Diabetes Sister   . Cancer Sister        pancreatic and lung   . Cancer Brother        pancreatic  . Cancer Brother        pancreatic    Review lab tests/diagnostics No results for input(s): WBC, HGB, HCT, PLT, MCV in the last 168 hours. No results for input(s): NA, K, CL, CO2, BUN, CREATININE, GLUCOSE in the last 168 hours. Latest GFR by Cockcroft Gault (not valid in AKI or ESRD) CrCl cannot be calculated (Patient's most recent lab result is older than the maximum 21 days allowed.). No results for input(s): AST, ALT, ALKPHOS, GGT in the last 168 hours.  Invalid input(s): TBILI, CONJBILI, ALB, TOTALPROTEIN No components found for: ALB No results for input(s): APTT, INR in the last 168 hours.  Invalid input(s): PTPATIENT No results for input(s): BNP, PROBNP in the last 168 hours.  ALLERGIES: No Known Allergies    PERTINENT MEDICATIONS:  Outpatient Encounter Medications as of 08/09/2020  Medication Sig  . albuterol (PROVENTIL) (2.5 MG/3ML) 0.083% nebulizer solution Take 3 mLs (2.5 mg total) by nebulization every 4 (four) hours as needed for wheezing or shortness of breath.  Marland Kitchen albuterol (PROVENTIL) (2.5 MG/3ML) 0.083% nebulizer solution Take 2.5 mg by nebulization every 4 (four) hours as needed for wheezing or shortness of breath.  Marland Kitchen albuterol (VENTOLIN HFA) 108 (90 Base) MCG/ACT inhaler Inhale 1 puff into the lungs every 6 (six) hours as needed for shortness of breath.   Marland Kitchen apixaban (ELIQUIS) 5 MG TABS tablet Take 1 tablet (5 mg total) by mouth 2 (two) times daily.  . Ascorbic Acid (VITAMIN C) 100 MG tablet Take 100 mg by mouth daily.  . B Complex-Biotin-FA (VITAMIN B50 COMPLEX PO) Take 1 tablet by mouth daily.  . bictegravir-emtricitabine-tenofovir AF (BIKTARVY) 50-200-25 MG TABS tablet Take 1 tablet by mouth daily.  . diclofenac Sodium (VOLTAREN) 1 % GEL Apply 4 g topically 2 (two) times daily as needed for pain.  . DULoxetine (CYMBALTA) 20 MG capsule Take 20 mg by mouth daily.  . Fluticasone-Salmeterol (ADVAIR) 500-50 MCG/DOSE AEPB Inhale 1  puff into the lungs 2 (two) times daily.  Marland Kitchen gabapentin (NEURONTIN) 800 MG tablet Take 800 mg by mouth 3 (three) times daily.  . hydrOXYzine (ATARAX/VISTARIL) 25 MG tablet Take 1 tablet (25 mg total) by mouth every 6 (six) hours.  . hydrOXYzine (ATARAX/VISTARIL) 25 MG tablet Take 25 mg by mouth every 6 (six) hours.  Marland Kitchen ibuprofen (ADVIL) 200 MG tablet Take 800 mg by mouth every 6 (six) hours as needed for moderate pain.  Marland Kitchen loratadine (CLARITIN) 10 MG tablet Take 10 mg by mouth daily.  . montelukast (SINGULAIR) 10 MG tablet Take 10 mg by mouth at bedtime.   . polyethylene glycol (MIRALAX / GLYCOLAX) 17 g packet Take 17 g by mouth 2 (two) times daily.   . QUEtiapine Fumarate (SEROQUEL XR) 150 MG 24 hr tablet Take 150 mg by mouth at bedtime.  Marland Kitchen rOPINIRole (REQUIP) 1 MG tablet Take 1 tablet (1 mg total) by mouth at bedtime.  Marland Kitchen SPIRIVA RESPIMAT 2.5 MCG/ACT AERS INHALE 2 PUFFS INTO THE LUNGS DAILY (Patient taking differently: Inhale 2 puffs into the lungs daily. )  . tiZANidine (ZANAFLEX) 4 MG tablet Take 4 mg by mouth 2 (two) times daily as needed for muscle spasms.   . traMADol (ULTRAM) 50 MG tablet Take 50 mg by mouth every 8 (eight)  hours as needed (pain).   . triamcinolone (KENALOG) 0.1 % Apply topically 2 (two) times daily as needed.  . triamcinolone ointment (KENALOG) 0.5 % Apply 1 application topically 2 (two) times daily.  Marland Kitchen triamcinolone ointment (KENALOG) 0.5 % Apply 1 application topically 2 (two) times daily.  Marland Kitchen venlafaxine XR (EFFEXOR-XR) 150 MG 24 hr capsule Take 150 mg by mouth daily.  Marland Kitchen VITAMIN A PO Take 1 tablet by mouth daily.   No facility-administered encounter medications on file as of 08/09/2020.    ROS  General: NAD EYES: denies vision changes ENMT: denies dysphagia no xerostomia Cardiovascular: denies chest pain Pulmonary: denies  cough, endorses occasional SOB  Abdomen: endorses fair appetite, no constipation or diarrhea GU: denies dysuria or urinary frquency MSK:   endorses ROM limitations, no falls reported Skin: denies rashes or wounds Neurological: endorses weakness, denies pain, denies insomnia Psych: Endorses positive mood Heme/lymph/immuno: denies bruises, abnormal bleeding   PHYSICAL EXAM  General: In no acute distress,  Cardiovascular: regular rate and rhythm Pulmonary: no cough, no increased work of breathing, normal respiratory effort; not on oxygen during visit Abdomen: soft, non tender, positive bowel sounds in all quadrants GU:  no suprapubic tenderness Eyes: Normal lids, no discharge, sclera anicteric ENMT: Moist mucous membranes Musculoskeletal:  no edema in BLE Skin: no rash to visible skin, warm without cyanosis Psych: non-anxious affect Neurological: Weakness but otherwise non focal Heme/lymph/immuno: no bruises, no bleeding  Thank you for the opportunity to participate in the care of [name] Please call our office at [phone] if we can be of additional assistance.  Note: Portions of this note were generated with [employer]. Dictation errors may occur despite best attempts at proofreading.  Teodoro Spray, NP

## 2020-08-20 ENCOUNTER — Other Ambulatory Visit: Payer: Self-pay | Admitting: Radiation Therapy

## 2020-08-20 ENCOUNTER — Telehealth: Payer: Self-pay | Admitting: Radiation Therapy

## 2020-08-20 DIAGNOSIS — C7931 Secondary malignant neoplasm of brain: Secondary | ICD-10-CM

## 2020-08-20 NOTE — Telephone Encounter (Signed)
Spoke with Tammy at [facility] about [name]'s upcoming brain MRI and follow-up with Dr. Mickeal Skinner in March. She has written down the appointment information and will set up transportation for the patient to attend.   Mont Dutton R.T.(R)(T) Radiation Special Procedures Navigator

## 2020-09-05 ENCOUNTER — Ambulatory Visit (HOSPITAL_COMMUNITY)
Admission: RE | Admit: 2020-09-05 | Discharge: 2020-09-05 | Disposition: A | Discharge status: Home / Self Care | Payer: Medicare Other | Source: Ambulatory Visit | Attending: Radiation Oncology | Admitting: Radiation Oncology

## 2020-09-05 DIAGNOSIS — C7931 Secondary malignant neoplasm of brain: Secondary | ICD-10-CM | POA: Diagnosis not present

## 2020-09-05 MED ORDER — GADOBUTROL 1 MMOL/ML IV SOLN
9.0000 mL | Freq: Once | INTRAVENOUS | Status: AC | PRN
  Administered 2020-09-05: 9 mL via INTRAVENOUS

## 2020-09-09 ENCOUNTER — Telehealth: Payer: Self-pay | Admitting: Radiation Oncology

## 2020-09-10 ENCOUNTER — Inpatient Hospital Stay: Payer: Medicare Other | Attending: Internal Medicine | Admitting: Internal Medicine

## 2020-09-10 DIAGNOSIS — Z7951 Long term (current) use of inhaled steroids: Secondary | ICD-10-CM | POA: Insufficient documentation

## 2020-09-10 DIAGNOSIS — Z8673 Personal history of transient ischemic attack (TIA), and cerebral infarction without residual deficits: Secondary | ICD-10-CM | POA: Insufficient documentation

## 2020-09-10 DIAGNOSIS — Z7901 Long term (current) use of anticoagulants: Secondary | ICD-10-CM | POA: Insufficient documentation

## 2020-09-10 DIAGNOSIS — Z801 Family history of malignant neoplasm of trachea, bronchus and lung: Secondary | ICD-10-CM | POA: Insufficient documentation

## 2020-09-10 DIAGNOSIS — C7931 Secondary malignant neoplasm of brain: Secondary | ICD-10-CM | POA: Insufficient documentation

## 2020-09-10 DIAGNOSIS — J449 Chronic obstructive pulmonary disease, unspecified: Secondary | ICD-10-CM | POA: Insufficient documentation

## 2020-09-10 DIAGNOSIS — Z79899 Other long term (current) drug therapy: Secondary | ICD-10-CM | POA: Insufficient documentation

## 2020-09-10 DIAGNOSIS — E785 Hyperlipidemia, unspecified: Secondary | ICD-10-CM | POA: Insufficient documentation

## 2020-09-10 DIAGNOSIS — B20 Human immunodeficiency virus [HIV] disease: Secondary | ICD-10-CM | POA: Insufficient documentation

## 2020-09-10 DIAGNOSIS — R4189 Other symptoms and signs involving cognitive functions and awareness: Secondary | ICD-10-CM | POA: Insufficient documentation

## 2020-09-10 DIAGNOSIS — C3490 Malignant neoplasm of unspecified part of unspecified bronchus or lung: Secondary | ICD-10-CM | POA: Insufficient documentation

## 2020-09-17 ENCOUNTER — Telehealth: Payer: Self-pay | Admitting: Internal Medicine

## 2020-09-17 NOTE — Telephone Encounter (Signed)
Received a staff msg to r/s Ms. Makarewicz new pt appt w/Dr. Mickeal Skinner. I cld [facility] and spoke to [place]. I provided an appt for Ms. Hosmer to see Dr. Mickeal Skinner on 3/25 at 930am.

## 2020-09-20 ENCOUNTER — Encounter: Payer: Self-pay | Admitting: Internal Medicine

## 2020-09-20 ENCOUNTER — Inpatient Hospital Stay (HOSPITAL_BASED_OUTPATIENT_CLINIC_OR_DEPARTMENT_OTHER): Payer: Medicare Other | Admitting: Internal Medicine

## 2020-09-20 ENCOUNTER — Other Ambulatory Visit: Payer: Self-pay

## 2020-09-20 VITALS — BP 86/60 | HR 80 | Temp 98.1°F | Resp 18 | Ht 69.0 in

## 2020-09-20 DIAGNOSIS — Z7951 Long term (current) use of inhaled steroids: Secondary | ICD-10-CM | POA: Diagnosis not present

## 2020-09-20 DIAGNOSIS — Z7901 Long term (current) use of anticoagulants: Secondary | ICD-10-CM | POA: Diagnosis not present

## 2020-09-20 DIAGNOSIS — C7931 Secondary malignant neoplasm of brain: Secondary | ICD-10-CM

## 2020-09-20 DIAGNOSIS — Z79899 Other long term (current) drug therapy: Secondary | ICD-10-CM | POA: Diagnosis not present

## 2020-09-20 DIAGNOSIS — B20 Human immunodeficiency virus [HIV] disease: Secondary | ICD-10-CM | POA: Diagnosis not present

## 2020-09-20 DIAGNOSIS — E785 Hyperlipidemia, unspecified: Secondary | ICD-10-CM | POA: Diagnosis not present

## 2020-09-20 DIAGNOSIS — J449 Chronic obstructive pulmonary disease, unspecified: Secondary | ICD-10-CM | POA: Diagnosis not present

## 2020-09-20 DIAGNOSIS — R4189 Other symptoms and signs involving cognitive functions and awareness: Secondary | ICD-10-CM | POA: Diagnosis not present

## 2020-09-20 DIAGNOSIS — Z801 Family history of malignant neoplasm of trachea, bronchus and lung: Secondary | ICD-10-CM | POA: Diagnosis not present

## 2020-09-20 DIAGNOSIS — Z8673 Personal history of transient ischemic attack (TIA), and cerebral infarction without residual deficits: Secondary | ICD-10-CM | POA: Diagnosis not present

## 2020-09-20 DIAGNOSIS — C3490 Malignant neoplasm of unspecified part of unspecified bronchus or lung: Secondary | ICD-10-CM | POA: Diagnosis not present

## 2020-09-20 NOTE — Progress Notes (Signed)
[facility] [place], [place] [address] [phone]   Interval Evaluation  Date of Service: 09/20/20 Patient Name: [name] Patient MRN: [id number] Patient DOB: 08-21-1945 Provider: Ventura Sellers, MD  Identifying Statement:  [name] is a 75 y.o. 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 Lisbeth Renshaw 27Gy/62fx  History of Present Illness: The patient's records from the referring physician were obtained and reviewed and the patient interviewed to confirm this HPI.  [name] presents today for follow up after recent MRI brain.  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  She lives in nursing facility full time and is confined to the bed all day.  Her quality of life is very poor, but she has no other options given level of disability.  She does have pyschiatric support, and palliative care has been involved at the facility per provided documentation.  Denies seizures or headaches.  Recently completed course of antibiotics for suspected soft tissue infection along scalp.  Medications: Current Outpatient Medications on File Prior to Visit  Medication Sig Dispense Refill  . apixaban (ELIQUIS) 5 MG TABS tablet Take 1 tablet (5 mg total) by mouth 2 (two) times daily. 60 tablet 1  . Ascorbic Acid (VITAMIN C) 100 MG tablet Take 100 mg by mouth daily.    . B Complex-Biotin-FA (VITAMIN B50 COMPLEX PO) Take 1 tablet by mouth daily.    . bictegravir-emtricitabine-tenofovir AF (BIKTARVY) 50-200-25 MG TABS tablet Take 1 tablet by mouth daily. 30 tablet 11  . DULoxetine (CYMBALTA) 20 MG capsule Take 20 mg by mouth daily.    . Fluticasone-Salmeterol (ADVAIR) 500-50 MCG/DOSE AEPB  Inhale 1 puff into the lungs 2 (two) times daily.    Marland Kitchen gabapentin (NEURONTIN) 800 MG tablet Take 800 mg by mouth 3 (three) times daily.    . hydrOXYzine (ATARAX/VISTARIL) 25 MG tablet Take 25 mg by mouth every 6 (six) hours.    Marland Kitchen ibuprofen (ADVIL) 200 MG tablet Take 800 mg by mouth every 6 (six) hours as needed for moderate pain.    . montelukast (SINGULAIR) 10 MG tablet Take 10 mg by mouth at bedtime.     . polyethylene glycol (MIRALAX / GLYCOLAX) 17 g packet Take 17 g by mouth 2 (two) times daily.    . QUEtiapine Fumarate (SEROQUEL XR) 150 MG 24 hr tablet Take 150 mg by mouth at bedtime.    Marland Kitchen rOPINIRole (REQUIP) 1 MG tablet Take 1 tablet (1 mg total) by mouth at bedtime. 30 tablet 1  . SPIRIVA RESPIMAT 2.5 MCG/ACT AERS INHALE 2 PUFFS INTO THE LUNGS DAILY (Patient taking differently: Inhale 2 puffs into the lungs daily.) 1 Inhaler 0  . tiZANidine (ZANAFLEX) 4 MG tablet Take 4 mg by mouth 2 (two) times daily as needed for muscle spasms.     . traMADol (ULTRAM) 50 MG tablet Take 50 mg by mouth every 8 (eight) hours as needed (pain).     . triamcinolone (KENALOG) 0.1 % Apply topically 2 (two) times daily as needed.    . venlafaxine XR (EFFEXOR-XR) 150 MG 24 hr capsule Take 150 mg by mouth daily.    Marland Kitchen VITAMIN A PO Take 1 tablet by mouth daily.    Marland Kitchen albuterol (VENTOLIN HFA) 108 (90 Base)  MCG/ACT inhaler Inhale 1 puff into the lungs every 6 (six) hours as needed for shortness of breath.     . diclofenac Sodium (VOLTAREN) 1 % GEL Apply 4 g topically 2 (two) times daily as needed for pain. (Patient not taking: Reported on 09/20/2020)     No current facility-administered medications on file prior to visit.    Allergies: No Known Allergies Past Medical History:  Past Medical History:  Diagnosis Date  . Asthma   . Atypical squamous cells of undetermined significance (ASCUS) on Papanicolaou smear of cervix   . Bed bug bite 07/12/2019  . Cancer ([place])   . COPD (chronic obstructive pulmonary disease)  ([place])   . H/O drainage of abscess    left axilla, from left breast  . Hep B w/o coma   . HIV (human immunodeficiency virus infection) ([place])   . Hyperlipidemia   . Lung cancer ([place]) 12/14/13   LUL Adenocarcinoma  . Neuromuscular disorder ([place])    fingers/feet neuropathy  . Non-small cell lung cancer ([place])   . Renal disorder   . S/P radiation therapy 01/26/14-02/02/14   sbrt  lt upper lung-54Gy/3fx  . Stroke [facility])    Past Surgical History:  Past Surgical History:  Procedure Laterality Date  . ABCESS DRAINAGE     abcess under Left arm, abcess from L breast  . APPLICATION OF CRANIAL NAVIGATION Right 11/28/2019   Procedure: APPLICATION OF CRANIAL NAVIGATION;  Surgeon: [name], MD;  Location: [facility];  Service: Neurosurgery;  Laterality: Right;  . BRAIN SURGERY    . BUBBLE STUDY  03/08/2019   Procedure: BUBBLE STUDY;  Surgeon: [name], MD;  Location: [facility];  Service: Cardiovascular;;  . CRANIOTOMY Right 11/28/2019   Procedure: Right Craniotomy for Tumor Resection;  Surgeon: [name], MD;  Location: [facility];  Service: Neurosurgery;  Laterality: Right;  . CRANIOTOMY    . removal of abnormal cells on uterus    . TEE WITHOUT CARDIOVERSION N/A 03/08/2019   Procedure: TRANSESOPHAGEAL ECHOCARDIOGRAM (TEE);  Surgeon: [name], MD;  Location: [facility] ENDOSCOPY;  Service: Cardiovascular;  Laterality: N/A;  . thyroid gland removed     Social History:  Social History   Socioeconomic History  . Marital status: Single    Spouse name: Not on file  . Number of children: Not on file  . Years of education: Not on file  . Highest education level: Not on file  Occupational History  . Not on file  Tobacco Use  . Smoking status: Never Smoker  . Smokeless tobacco: Never Used  . Tobacco comment: quit date the day she starts her radiation at the [facility]  Substance and Sexual Activity  . Alcohol use: Not Currently    Comment: none in 20 years  . Drug use: Not  Currently    Comment: none in 20 years  . Sexual activity: Not Currently    Partners: Male    Comment: given condoms  Other Topics Concern  . Not on file  Social History Narrative   ** Merged History Encounter **       Social Determinants of Health   Financial Resource Strain: Not on file  Food Insecurity: Not on file  Transportation Needs: Not on file  Physical Activity: Not on file  Stress: Not on file  Social Connections: Not on file  Intimate Partner Violence: Not on file   Family History:  Family History  Problem Relation Age of Onset  . Pneumonia Mother   .  Hypertension Sister   . Diabetes Sister   . Cancer Sister        pancreatic and lung  . Cancer Brother        pancreatic  . Cancer Brother        pancreatic    Review of Systems: Constitutional: Doesn't report fevers, chills or abnormal weight loss Eyes: Doesn't report blurriness of vision Ears, nose, mouth, throat, and face: Doesn't report sore throat Respiratory: Doesn't report cough, dyspnea or wheezes Cardiovascular: Doesn't report palpitation, chest discomfort  Gastrointestinal:  Doesn't report nausea, constipation, diarrhea GU: Doesn't report incontinence Skin: +itching Neurological: Per HPI Musculoskeletal: Doesn't report joint pain Behavioral/Psych: +anxiety  Physical Exam: Vitals:   09/20/20 0859  BP: (!) 86/60  Pulse: 80  Resp: 18  Temp: 98.1 F (36.7 C)  SpO2: (!) 89%   KPS: 60. General: In wheelchair, plegic Head: Granulation tissue along surgical scar, not purulent EENT: No conjunctival injection or scleral icterus.  Lungs: Resp effort normal Cardiac: Regular rate Abdomen: Non-distended abdomen Skin: No rashes cyanosis or petechiae. Extremities: No clubbing or edema  Neurologic Exam: Mental Status: Awake, alert, attentive to examiner. Oriented to self and environment. Language is fluent with intact comprehension.  Disorganized and tangential thoughts with some paranoid  delusions.  Cranial Nerves: Visual acuity is grossly normal. Visual fields are full. Extra-ocular movements intact. No ptosis. Face is symmetric Motor: Tone and bulk are normal. She is plegic and spastic in left arm and leg. Reflexes are symmetric, no pathologic reflexes present.  Sensory: Intact to light touch Gait: Non ambulatory  Labs: I have reviewed the data as listed    Component Value Date/Time   NA 141 01/09/2020 2038   NA 143 12/28/2013 0920   K 4.7 01/09/2020 2038   K 3.8 12/28/2013 0920   CL 100 01/09/2020 2038   CO2 24 01/09/2020 2038   CO2 28 12/28/2013 0920   GLUCOSE 77 01/09/2020 2038   GLUCOSE 148 (H) 12/28/2013 0920   BUN 24 (H) 05/22/2020 1033   BUN 14.5 12/28/2013 0920   CREATININE 1.11 (H) 05/22/2020 1033   CREATININE 0.98 (H) 06/28/2019 1424   CREATININE 1.4 (H) 12/28/2013 0920   CALCIUM 9.6 01/09/2020 2038   CALCIUM 9.2 12/28/2013 0920   PROT 7.5 01/02/2020 0820   PROT 7.9 12/28/2013 0920   ALBUMIN 3.0 (L) 01/02/2020 0820   ALBUMIN 3.8 12/28/2013 0920   AST 23 01/02/2020 0820   AST 17 12/28/2013 0920   ALT 21 01/02/2020 0820   ALT 16 12/28/2013 0920   ALKPHOS 58 01/02/2020 0820   ALKPHOS 76 12/28/2013 0920   BILITOT 0.6 01/02/2020 0820   BILITOT 0.24 12/28/2013 0920   GFRNONAA 53 (L) 05/22/2020 1033   GFRNONAA 58 (L) 06/28/2019 1424   GFRAA >60 01/09/2020 2038   GFRAA >60 12/26/2019 0804   GFRAA 67 06/28/2019 1424   Lab Results  Component Value Date   WBC 12.1 (H) 01/09/2020   NEUTROABS 7.3 01/09/2020   HGB 15.1 (H) 01/09/2020   HCT 48.5 (H) 01/09/2020   MCV 89.0 01/09/2020   PLT 423 (H) 01/09/2020    Imaging:  MR Brain W Wo Contrast  Result Date: 09/06/2020 CLINICAL DATA:  Metastatic lung cancer. Assess treatment response. Craniotomy and radiation. EXAM: MRI HEAD WITHOUT AND WITH CONTRAST TECHNIQUE: Multiplanar, multiecho pulse sequences of the brain and surrounding structures were obtained without and with intravenous contrast.  CONTRAST:  23mL GADAVIST GADOBUTROL 1 MMOL/ML IV SOLN COMPARISON:  MRI head 05/28/2020 FINDINGS:  Brain: Right parietal convexity craniotomy for tumor resection. Abnormal enhancement in the right convexity subdural and subarachnoid space is unchanged. White matter edema in the high right parietal lobe is unchanged. Chronic blood products in the area unchanged. Enhancing tissue extends through the burr hole into the right parietal scalp where there is thickened enhancing soft tissue extending into the scalp, stable from the prior study. No new metastatic deposits in the brain identified. Chronic hemorrhagic infarct right lateral temporal lobe unchanged. No acute infarct. Vascular: Normal arterial flow voids Skull and upper cervical spine: Right parietal convexity craniotomy as described above. Sinuses/Orbits: Chronic fracture left orbital floor unchanged. No orbital mass. Other: None IMPRESSION: Stable MRI. Postop craniotomy right parietal convexity. Abnormal enhancement in the subarachnoid and subdural space over the right convexity is stable and could represent postop granulation tissue versus stable tumor. Enhancing tumor extends through the burr hole into the scalp where there is thickened enhancing tissue, unchanged from the prior study. Continued follow-up warranted Electronically Signed   By: Franchot Gallo M.D.   On: 09/06/2020 09:36    [place] Clinician Interpretation: I have personally reviewed the radiological images as listed.  My interpretation, in the context of the patient's clinical presentation, is stable disease   Assessment/Plan Brain metastasis ([place]) [C79.31]  BAY JARQUIN is clinically stable today from CNS oncologic standpoint.  MRI demonstrates brain invasive dural based metastasis which is stable from prior study.  She has severe baseline deficits, mostly from prior embolic stroke, which have not appreciably worsened per chart review.  Quality of life is poor, and insight is impaired  due to pyschiatric and cognitive deficits.  We are appreciative of continued support from psychiatry and palliative care teams.    Obtaining an MRI is very difficult for her.  We will recommend repeat MRI brain study, pushed out to 6 months given quality of life considerations.   We spent twenty additional minutes teaching regarding the natural history, biology, and historical experience in the treatment of neurologic complications of cancer.   We appreciate the opportunity to participate in the care of [name].  She can follow up after next MRI, or sooner if needed.  All questions were answered. The patient knows to call the clinic with any problems, questions or concerns. No barriers to learning were detected.  The total time spent in the encounter was 40 minutes and more than 50% was on counseling and review of test results   Ventura Sellers, MD Medical Director of Neuro-Oncology [facility] 09/20/20 3:54 PM

## 2020-10-07 ENCOUNTER — Emergency Department (HOSPITAL_COMMUNITY)
Admission: EM | Admit: 2020-10-07 | Discharge: 2020-10-07 | Disposition: A | Discharge status: Home / Self Care | Payer: Medicare Other | Attending: Emergency Medicine | Admitting: Emergency Medicine

## 2020-10-07 ENCOUNTER — Emergency Department (HOSPITAL_COMMUNITY): Payer: Medicare Other

## 2020-10-07 ENCOUNTER — Encounter (HOSPITAL_COMMUNITY): Payer: Self-pay | Admitting: Emergency Medicine

## 2020-10-07 ENCOUNTER — Other Ambulatory Visit: Payer: Self-pay

## 2020-10-07 DIAGNOSIS — Z87891 Personal history of nicotine dependence: Secondary | ICD-10-CM | POA: Diagnosis not present

## 2020-10-07 DIAGNOSIS — Z7951 Long term (current) use of inhaled steroids: Secondary | ICD-10-CM | POA: Diagnosis not present

## 2020-10-07 DIAGNOSIS — B20 Human immunodeficiency virus [HIV] disease: Secondary | ICD-10-CM | POA: Diagnosis not present

## 2020-10-07 DIAGNOSIS — R042 Hemoptysis: Secondary | ICD-10-CM | POA: Diagnosis present

## 2020-10-07 DIAGNOSIS — N183 Chronic kidney disease, stage 3 unspecified: Secondary | ICD-10-CM | POA: Diagnosis not present

## 2020-10-07 DIAGNOSIS — Z85118 Personal history of other malignant neoplasm of bronchus and lung: Secondary | ICD-10-CM | POA: Insufficient documentation

## 2020-10-07 DIAGNOSIS — Z7901 Long term (current) use of anticoagulants: Secondary | ICD-10-CM | POA: Diagnosis not present

## 2020-10-07 DIAGNOSIS — J441 Chronic obstructive pulmonary disease with (acute) exacerbation: Secondary | ICD-10-CM | POA: Insufficient documentation

## 2020-10-07 DIAGNOSIS — Z20822 Contact with and (suspected) exposure to covid-19: Secondary | ICD-10-CM | POA: Insufficient documentation

## 2020-10-07 DIAGNOSIS — J45909 Unspecified asthma, uncomplicated: Secondary | ICD-10-CM | POA: Insufficient documentation

## 2020-10-07 DIAGNOSIS — I129 Hypertensive chronic kidney disease with stage 1 through stage 4 chronic kidney disease, or unspecified chronic kidney disease: Secondary | ICD-10-CM | POA: Diagnosis not present

## 2020-10-07 LAB — COMPREHENSIVE METABOLIC PANEL
ALT: 18 U/L (ref 0–44)
AST: 22 U/L (ref 15–41)
Albumin: 3.8 g/dL (ref 3.5–5.0)
Alkaline Phosphatase: 58 U/L (ref 38–126)
Anion gap: 9 (ref 5–15)
BUN: 18 mg/dL (ref 8–23)
CO2: 28 mmol/L (ref 22–32)
Calcium: 9.1 mg/dL (ref 8.9–10.3)
Chloride: 101 mmol/L (ref 98–111)
Creatinine, Ser: 1.12 mg/dL — ABNORMAL HIGH (ref 0.44–1.00)
GFR, Estimated: 52 mL/min — ABNORMAL LOW (ref >60)
Glucose, Bld: 148 mg/dL — ABNORMAL HIGH (ref 70–99)
Potassium: 4.1 mmol/L (ref 3.5–5.1)
Sodium: 138 mmol/L (ref 135–145)
Total Bilirubin: 0.5 mg/dL (ref 0.3–1.2)
Total Protein: 8.5 g/dL — ABNORMAL HIGH (ref 6.5–8.1)

## 2020-10-07 LAB — CBC WITH DIFFERENTIAL/PLATELET
Abs Immature Granulocytes: 0.03 10*3/uL (ref 0.00–0.07)
Basophils Absolute: 0 10*3/uL (ref 0.0–0.1)
Basophils Relative: 0 %
Eosinophils Absolute: 0 10*3/uL (ref 0.0–0.5)
Eosinophils Relative: 1 %
HCT: 32.8 % — ABNORMAL LOW (ref 36.0–46.0)
Hemoglobin: 10.2 g/dL — ABNORMAL LOW (ref 12.0–15.0)
Immature Granulocytes: 0 %
Lymphocytes Relative: 19 %
Lymphs Abs: 1.3 10*3/uL (ref 0.7–4.0)
MCH: 27.9 pg (ref 26.0–34.0)
MCHC: 31.1 g/dL (ref 30.0–36.0)
MCV: 89.9 fL (ref 80.0–100.0)
Monocytes Absolute: 0.4 10*3/uL (ref 0.1–1.0)
Monocytes Relative: 6 %
Neutro Abs: 4.9 10*3/uL (ref 1.7–7.7)
Neutrophils Relative %: 74 %
Platelets: 218 10*3/uL (ref 150–400)
RBC: 3.65 MIL/uL — ABNORMAL LOW (ref 3.87–5.11)
RDW: 13.9 % (ref 11.5–15.5)
WBC: 6.7 10*3/uL (ref 4.0–10.5)
nRBC: 0 % (ref 0.0–0.2)

## 2020-10-07 LAB — RESP PANEL BY RT-PCR (FLU A&B, COVID) ARPGX2
Influenza A by PCR: NEGATIVE
Influenza B by PCR: NEGATIVE
SARS Coronavirus 2 by RT PCR: NEGATIVE

## 2020-10-07 MED ORDER — SALINE SPRAY 0.65 % NA SOLN
1.0000 | Freq: Once | NASAL | Status: AC
  Administered 2020-10-07: 1 via NASAL
  Filled 2020-10-07: qty 44

## 2020-10-07 MED ORDER — DOXYCYCLINE HYCLATE 100 MG PO CAPS: 100.0000 mg | ORAL_CAPSULE | Freq: Two times a day (BID) | ORAL | 0 refills | Status: DC

## 2020-10-07 MED ORDER — SALINE SPRAY 0.65 % NA SOLN: 1.0000 | NASAL | 0 refills | Status: DC | PRN

## 2020-10-07 MED ORDER — PREDNISONE 20 MG PO TABS
60.0000 mg | ORAL_TABLET | Freq: Once | ORAL | Status: AC
  Administered 2020-10-07: 60 mg via ORAL
  Filled 2020-10-07: qty 3

## 2020-10-07 MED ORDER — DOXYCYCLINE HYCLATE 100 MG PO TABS
100.0000 mg | ORAL_TABLET | Freq: Once | ORAL | Status: AC
  Administered 2020-10-07: 100 mg via ORAL
  Filled 2020-10-07: qty 1

## 2020-10-07 MED ORDER — PREDNISONE 20 MG PO TABS: ORAL_TABLET | ORAL | 0 refills | Status: DC

## 2020-10-07 MED ORDER — IPRATROPIUM-ALBUTEROL 0.5-2.5 (3) MG/3ML IN SOLN
3.0000 mL | RESPIRATORY_TRACT | Status: DC
  Administered 2020-10-07: 3 mL via RESPIRATORY_TRACT
  Filled 2020-10-07: qty 3

## 2020-10-07 NOTE — ED Notes (Addendum)
[facility] forget Patient DNR papers. Unable to reach facility and emergency contact at this time.

## 2020-10-07 NOTE — ED Notes (Signed)
I called the [facility] and told Newt Minion who is in medical records that the DNR was left and was told they will make a new one and we can keep this one, I am sending the forms to medical records in case something changes .Pauline Good Sec/monitor tech.

## 2020-10-07 NOTE — ED Provider Notes (Signed)
[facility] DEPT Provider Note   CSN: [id number] Arrival date & time: 10/07/20  0107     History Chief Complaint  Patient presents with  . Shortness of Breath    [name] is a 75 y.o. female.  Patient with history of non-small cell lung cancer, HIV, COPD multiple other medical problems documented below who presents the [facility] today with hemoptysis.  Patient states that she is been coughing for a while and then coughing up more mucus than normal but recently she has had some blood in her sputum and the facility was concerned so sent here for further evaluation.  She denies any fever but does feel she has been coughing more than normal.  She denies any sick contacts.  She has no other associated symptoms.  No abdominal pain or chest pain associated with this.  Review the records apparently patient has metastatic cancer to the brain as well.  She been on palliative care recently.  She also is bedbound.   Shortness of Breath      Past Medical History:  Diagnosis Date  . Asthma   . Atypical squamous cells of undetermined significance (ASCUS) on Papanicolaou smear of cervix   . Bed bug bite 07/12/2019  . Cancer ([place])   . COPD (chronic obstructive pulmonary disease) ([place])   . H/O drainage of abscess    left axilla, from left breast  . Hep B w/o coma   . HIV (human immunodeficiency virus infection) ([place])   . Hyperlipidemia   . Lung cancer ([place]) 12/14/13   LUL Adenocarcinoma  . Neuromuscular disorder ([place])    fingers/feet neuropathy  . Non-small cell lung cancer ([place])   . Renal disorder   . S/P radiation therapy 01/26/14-02/02/14   sbrt  lt upper lung-54Gy/46fx  . Stroke [facility])     Patient Active Problem List   Diagnosis Date Noted  . Brain metastasis ([place]) 05/22/2020  . 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 HIV (human immunodeficiency virus infection) ([place]) 01/02/2020  . Brain mass 01/02/2020  . OSA (obstructive sleep apnea) 01/02/2020   . Delusional disorder ([place]) 12/06/2019  . Cerebral edema (HCC)   . Encephalopathy   . HIV disease ([place])   . OSA (obstructive sleep apnea)   . Dyslipidemia   . History of CVA with residual deficit   . Brain tumor ([place]) 11/28/2019  . Change in mental status 11/21/2019  . Bed bug bite 07/12/2019  . DNR (do not resuscitate)   . Altered mental status   . Acute pulmonary embolism without acute cor pulmonale (HCC)   . Acute ischemic right MCA stroke ([place])   . Palliative care encounter   . Aortic embolism or thrombosis ([place])   . CVA (cerebral vascular accident) ([place]) 03/02/2019  . COPD (chronic obstructive pulmonary disease) ([place]) 03/01/2019  . PE (pulmonary thromboembolism) ([place]) 03/01/2019  . Tobacco abuse 03/01/2019  . 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[place]) 03/01/2019  . CKD (chronic kidney disease), stage III ([place]) 03/01/2019  . Generalized weakness 03/01/2019  . Fall at home   . 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([place])   . Splenic infarct   . Acute and chronic respiratory failure (acute-on-chronic) ([place]) 02/11/2019  . CKD (chronic kidney disease) stage 3, GFR 30-59 ml/min (HCC) 08/12/2016  . Lung nodule 10/12/2015  . Bronchitis 09/09/2015  . History of lung cancer 09/08/2015  . Thoracic aorta atherosclerosis ([place]) 09/08/2015  . Chronic obstructive pulmonary disease with (acute) exacerbation ([place]) 09/08/2015  . Sepsis ([place]) 03/28/2015  . CAP (community acquired  pneumonia) 03/28/2015  . UTI (lower urinary tract infection) 03/28/2015  . Respiratory failure ([place]) 03/28/2015  . Non-small cell lung cancer ([place])   . Malignant neoplasm of upper lobe, bronchus or lung 01/03/2014  . Acute hypoxemic respiratory failure ([place]) 10/31/2013  . COPD with acute exacerbation ([place]) 10/30/2013  . COPD exacerbation ([place]) 10/30/2013  . Other and unspecified noninfectious gastroenteritis and colitis(558.9) 11/20/2012  . Rectal bleed 11/19/2012  . Environmental allergies 11/02/2012  . Obesity 05/05/2012  . Renal insufficiency  05/05/2012  . Hyperglycemia 05/05/2012  . Asthma 12/10/2011  . Cigarette smoker 12/10/2011  . Hypercholesteremia 12/11/2010  . Hypertension 12/11/2010  . ALLERGIC RHINITIS, SEASONAL 07/03/2010  . COPD 07/03/2010  . HIV (human immunodeficiency virus infection) ([place]) 12/09/2006  . DEPRESSION 12/09/2006  . BREAST MASS, BENIGN 12/09/2006  . OSTEOPOROSIS 12/09/2006    Past Surgical History:  Procedure Laterality Date  . ABCESS DRAINAGE     abcess under Left arm, abcess from L breast  . APPLICATION OF CRANIAL NAVIGATION Right 11/28/2019   Procedure: APPLICATION OF CRANIAL NAVIGATION;  Surgeon: [name], MD;  Location: [facility];  Service: Neurosurgery;  Laterality: Right;  . BRAIN SURGERY    . BUBBLE STUDY  03/08/2019   Procedure: BUBBLE STUDY;  Surgeon: [name], MD;  Location: [facility];  Service: Cardiovascular;;  . CRANIOTOMY Right 11/28/2019   Procedure: Right Craniotomy for Tumor Resection;  Surgeon: [name], MD;  Location: [facility];  Service: Neurosurgery;  Laterality: Right;  . CRANIOTOMY    . removal of abnormal cells on uterus    . TEE WITHOUT CARDIOVERSION N/A 03/08/2019   Procedure: TRANSESOPHAGEAL ECHOCARDIOGRAM (TEE);  Surgeon: [name], MD;  Location: [facility] ENDOSCOPY;  Service: Cardiovascular;  Laterality: N/A;  . thyroid gland removed       OB History   No obstetric history on file.     Family History  Problem Relation Age of Onset  . Pneumonia Mother   . Hypertension Sister   . Diabetes Sister   . Cancer Sister        pancreatic and lung  . Cancer Brother        pancreatic  . Cancer Brother        pancreatic    Social History   Tobacco Use  . Smoking status: Never Smoker  . Smokeless tobacco: Never Used  . Tobacco comment: quit date the day she starts her radiation at the [facility]  Substance Use Topics  . Alcohol use: Not Currently    Comment: none in 20 years  . Drug use: Not Currently    Comment: none in 20 years     Home Medications Prior to Admission medications   Medication Sig Start Date End Date Taking? Authorizing Provider  albuterol (PROVENTIL) (2.5 MG/3ML) 0.083% nebulizer solution Take 2.5 mg by nebulization every 4 (four) hours as needed for wheezing or shortness of breath.   Yes [provider]  albuterol (VENTOLIN HFA) 108 (90 Base) MCG/ACT inhaler Inhale 1 puff into the lungs every 6 (six) hours as needed for shortness of breath.  12/23/19  Yes [provider]  ammonium lactate (LAC-HYDRIN) 12 % lotion Apply 1 application topically as needed for dry skin. Apply cream tp entire body and face topically every morning and apply generously to face torso and legs twice daily   Yes [provider]  apixaban (ELIQUIS) 5 MG TABS tablet Take 1 tablet (5 mg total) by mouth 2 (two) times daily. 03/13/19  Yes Hosie Poisson, MD  Ascorbic Acid (VITAMIN C) 100 MG tablet Take 100 mg by mouth daily.   Yes [provider]  B Complex-Biotin-FA (VITAMIN B50 COMPLEX PO) Take 1 tablet by mouth daily.   Yes [provider]  bictegravir-emtricitabine-tenofovir AF (BIKTARVY) 50-200-25 MG TABS tablet Take 1 tablet by mouth daily. 07/12/19  Yes Tommy Medal, Lavell Islam, MD  diclofenac Sodium (VOLTAREN) 1 % GEL Apply 4 g topically 2 (two) times daily as needed for pain. 11/09/19  Yes [provider]  doxycycline (VIBRAMYCIN) 100 MG capsule Take 1 capsule (100 mg total) by mouth 2 (two) times daily. One po bid x 7 days 10/07/20  Yes Sorina Derrig, Corene Cornea, MD  Fluticasone-Salmeterol (ADVAIR) 500-50 MCG/DOSE AEPB Inhale 1 puff into the lungs 2 (two) times daily. 09/04/19  Yes [provider]  gabapentin (NEURONTIN) 800 MG tablet Take 800 mg by mouth 3 (three) times daily. 10/26/19  Yes [provider]  hydrOXYzine (ATARAX/VISTARIL) 25 MG tablet Take 25 mg by mouth every 6 (six) hours. 08/03/19  Yes [provider]  ibuprofen (ADVIL) 200 MG tablet Take 800 mg by mouth  every 6 (six) hours as needed for moderate pain.   Yes [provider]  Melatonin 3 MG CAPS Take 1 capsule by mouth at bedtime.   Yes [provider]  montelukast (SINGULAIR) 10 MG tablet Take 10 mg by mouth at bedtime.  09/04/19  Yes [provider]  polyethylene glycol (MIRALAX / GLYCOLAX) 17 g packet Take 17 g by mouth 2 (two) times daily.   Yes [provider]  predniSONE (DELTASONE) 20 MG tablet 2 tabs po daily x 4 days 10/08/20  Yes Earnesteen Birnie, Corene Cornea, MD  QUEtiapine Fumarate (SEROQUEL XR) 150 MG 24 hr tablet Take 150 mg by mouth at bedtime. 11/09/19  Yes [provider]  rOPINIRole (REQUIP) 1 MG tablet Take 1 tablet (1 mg total) by mouth at bedtime. 05/20/19  Yes Ward, Delice Bison, DO  Skin Protectants, Misc. (EUCERIN) cream Apply 1 application topically See admin instructions. Apply cream to the entire body after bath every day   Yes [provider]  sodium chloride (OCEAN) 0.65 % SOLN nasal spray Place 1 spray into both nostrils as needed for congestion. 10/07/20  Yes Casondra Gasca, Corene Cornea, MD  SPIRIVA RESPIMAT 2.5 MCG/ACT AERS INHALE 2 PUFFS INTO THE LUNGS DAILY Patient taking differently: Inhale 2 puffs into the lungs daily. 09/24/14  Yes Rigoberto Noel, MD  tiZANidine (ZANAFLEX) 4 MG tablet Take 4 mg by mouth 2 (two) times daily as needed for muscle spasms.  11/08/19  Yes [provider]  traMADol (ULTRAM) 50 MG tablet Take 50 mg by mouth every 8 (eight) hours as needed (pain).  12/11/19  Yes [provider]  venlafaxine XR (EFFEXOR-XR) 150 MG 24 hr capsule Take 150 mg by mouth daily. 11/09/19  Yes [provider]    Allergies    Patient has no known allergies.  Review of Systems   Review of Systems  Respiratory: Positive for shortness of breath.   All other systems reviewed and are negative.   Physical Exam Updated Vital Signs BP 105/65   Pulse 78   Temp (!) 97.5 F (36.4 C) (Oral)   Resp 11   Ht 5\' 9"  (1.753 m)    Wt 90.7 kg   SpO2 97%   BMI 29.53 kg/m   Physical Exam Vitals and nursing note reviewed.  Constitutional:      Appearance: She is well-developed.  HENT:     Head: Normocephalic and atraumatic.  Eyes:     Pupils: Pupils are equal, round, and reactive to light.  Cardiovascular:     Rate and Rhythm: Normal rate and regular rhythm.  Pulmonary:     Effort: Tachypnea present. No respiratory distress.     Breath sounds: No stridor. Decreased breath sounds and wheezing present.  Abdominal:     General: There is no distension.  Musculoskeletal:     Cervical back: Normal range of motion.  Skin:    General: Skin is warm and dry.  Neurological:     Mental Status: She is alert.     ED Results / Procedures / Treatments   Labs (all labs ordered are listed, but only abnormal results are displayed) Labs Reviewed  CBC WITH DIFFERENTIAL/PLATELET - Abnormal; Notable for the following components:      Result Value   RBC 3.65 (*)    Hemoglobin 10.2 (*)    HCT 32.8 (*)    All other components within normal limits  COMPREHENSIVE METABOLIC PANEL - Abnormal; Notable for the following components:   Glucose, Bld 148 (*)    Creatinine, Ser 1.12 (*)    Total Protein 8.5 (*)    GFR, Estimated 52 (*)    All other components within normal limits  RESP PANEL BY RT-PCR (FLU A&B, COVID) [id number]    EKG None  Radiology DG Chest Portable 1 View  Result Date: 10/07/2020 CLINICAL DATA:  Cough EXAM: PORTABLE CHEST 1 VIEW COMPARISON:  01/02/2020 chest x-ray and CT FINDINGS: Emphysematous disease. No consolidation or effusion. Stable cardiomediastinal silhouette. Chronic left sixth rib deformity. IMPRESSION: No active disease. Emphysematous disease with increased bronchitic changes at the bases Electronically Signed   By: Donavan Foil M.D.   On: 10/07/2020 01:49    Procedures Procedures   Medications Ordered in ED Medications  ipratropium-albuterol (DUONEB) 0.5-2.5 (3) MG/3ML nebulizer solution 3  mL (3 mLs Nebulization Not Given 10/07/20 0334)  sodium chloride (OCEAN) 0.65 % nasal spray 1 spray (has no administration in time range)  doxycycline (VIBRA-TABS) tablet 100 mg (has no administration in time range)  predniSONE (DELTASONE) tablet 60 mg (has no administration in time range)    ED Course  I have reviewed the triage vital signs and the nursing notes.  Pertinent labs & imaging results that were available during my care of the patient were reviewed by me and considered in my medical decision making (see chart for details).    MDM Rules/Calculators/A&P                          75 year old female here with likely COPD exacerbation associated hemoptysis.  She does have a history of lung cancer but nothing active on her chest x-ray right now.  We will treat for COPD and reassess for disposition.  Likely can be discharged on antibiotics and steroids as long as her oxygen stays stable on her home O2.  On review the records it does appear that she has active metastatic cancer to the brain and now palliative care bedbound which also increases her risk for DVT/blood clot.  We will see how she improves with duo nebs but may need a CT scan.  She does have a PE and will be a tough decision on whether or not start anticoagulation with the known brain metastasis.  Breathing improved significantly here. Appears well. Will treat for bronchitis. PCP follow up if not improving. Doubt  PE with such a marked turnaround after duonebs and no further coughing/sob/hemoptysis afterwards.   Final Clinical Impression(s) / ED Diagnoses Final diagnoses:  COPD exacerbation ([place])    Rx / DC Orders ED Discharge Orders         Ordered    predniSONE (DELTASONE) 20 MG tablet        10/07/20 0437    doxycycline (VIBRAMYCIN) 100 MG capsule  2 times daily        10/07/20 0437    sodium chloride (OCEAN) 0.65 % SOLN nasal spray  As needed        10/07/20 0437           Shelba Susi, Corene Cornea, MD 10/07/20 [phone]

## 2020-10-07 NOTE — ED Notes (Addendum)
Patient to be discharge. Unable to reach facility for report. Will call again later.

## 2020-10-07 NOTE — ED Triage Notes (Addendum)
Pt BIB from SNF [facility]) c/o SOB. Pt stated she cough small amount of blood x 3days.  Pt denies Fever and  N/V/D. Pt a/ox4 BP 130/50 HR 80 RR 20 O2 sat 96% on 3L

## 2020-10-22 ENCOUNTER — Non-Acute Institutional Stay: Payer: Medicare Other | Admitting: Hospice

## 2020-10-22 ENCOUNTER — Other Ambulatory Visit: Payer: Self-pay

## 2020-10-22 DIAGNOSIS — J449 Chronic obstructive pulmonary disease, unspecified: Secondary | ICD-10-CM

## 2020-10-22 DIAGNOSIS — Z515 Encounter for palliative care: Secondary | ICD-10-CM

## 2020-10-22 DIAGNOSIS — R0602 Shortness of breath: Secondary | ICD-10-CM

## 2020-10-22 NOTE — Progress Notes (Signed)
[facility] Consult Note Telephone: [phone]  Fax: [phone]  PATIENT NAME: [name] DOB: Jan 13, 1946 MRN: [id number]  PRIMARY CARE PROVIDER:   Nolene Ebbs, MD Nolene Ebbs, MD [address] [place],  [place] [address]  REFERRING PROVIDER: Nolene Ebbs, MD Nolene Ebbs, MD [place],  [place] [address]  RESPONSIBLE PARTY:   Contact Information    Name Relation Home Work Mobile   [place] Sister [place], [place] Brother   [phone]      Visit is to build trust and highlight Palliative Medicine as specialized medical care for people living with serious illness, aimed at facilitating better quality of life through symptoms relief, assisting with advance care planning and complex medical decision making.   RECOMMENDATIONS/PLAN:   Advance Care Planning/Code Status: Patient is a DO NOT RESUSCITATE  Goals of Care: Goals of care include to maximize quality of life and symptom management.  Visit consisted of counseling and education dealing with the complex and emotionally intense issues of symptom management and palliative care in the setting of serious and potentially life-threatening illness. Palliative care team will continue to support patient, patient's family, and medical team.  Symptom management/Plan:  Shortness of breath: Administered Albuterol via nebulization during visit, effective, nursing switched out oxygen tubing.  Education to patient and nursing on need for adherence to breathing treatments as ordered. Slow deep breathing encouraged.  COPD: Continue albuterol, Spiriva, Advair, Singulair as ordered.  Oxygen supplementation as ordered.  Use fan for air circulation as needed. Agitation/delusional behavior managed with Quitepine Constipation: 17 g MiraLAX mixed in 4 to 6 ounces of choice fluid daily as needed for constipation.  Follow up: Palliative care will continue  to follow for complex medical decision making, advance care planning, and clarification of goals. Return 6 weeks or prn. Encouraged to call provider sooner with any concerns.  CHIEF COMPLAINT: Palliative follow up visit/shortness of breath  HISTORY OF PRESENT ILLNESS:  [name] a 75 y.o. female with multiple medical problems including acute on chronic shortness of breath related to COPD worsening in the last 2 weeks, resulting in ED visit 10/07/20 for COPD exacerbation. Patient says it is worse when she tries to turn her self in bed and this impairs her independence and quality of life; she said she has not received any of her breathing treatments today.  Breathing treatments are helpful.  She endorsed difficulty breathing during visit of which nursing asked to give Albuterol via nebulization. Patient out of her Spiriva and not getting breathing treatments as ordered.  Her nurse requested it from pharmacy and should arrive by evening time. Patient denies cough, fever/chills.  History obtained from review of EMR, discussion with primary team, family, caregiver  and/or patient. Records reviewed and summarized above. All 10 point systems reviewed and are negative except as documented in history of present illness above  Review and summarization of Epic records shows history from other than patient.   Palliative Care was asked to follow this patient by consultation request of Martinique Miller, NP to help address complex decision making in the context of advance care planning and goals of care clarification.   CODE STATUS: Full  PPS: 40%  HOSPICE ELIGIBILITY/DIAGNOSIS: TBD  PAST MEDICAL HISTORY:  Past Medical History:  Diagnosis Date  . Asthma   . Atypical squamous cells of undetermined significance (ASCUS) on Papanicolaou smear of cervix   . Bed bug bite 07/12/2019  . Cancer ([facility])   .  COPD (chronic obstructive pulmonary disease) ([facility])   . H/O drainage of abscess    left axilla, from left  breast  . Hep B w/o coma   . HIV (human immunodeficiency virus infection) ([facility])   . Hyperlipidemia   . Lung cancer ([facility]) 12/14/13   LUL Adenocarcinoma  . Neuromuscular disorder ([facility])    fingers/feet neuropathy  . Non-small cell lung cancer ([facility])   . Renal disorder   . S/P radiation therapy 01/26/14-02/02/14   sbrt  lt upper lung-54Gy/31fx  . Stroke [facility])      SOCIAL HX: @SOCX  Patient lives at SNF  for ongoing care   FAMILY HX:  Family History  Problem Relation Age of Onset  . Pneumonia Mother   . Hypertension Sister   . Diabetes Sister   . Cancer Sister        pancreatic and lung  . Cancer Brother        pancreatic  . Cancer Brother        pancreatic    Review lab tests/diagnostics Results for MICHIKO, LINEMAN (MRN [id number]) as of 10/22/2020 12:10  Ref. Range 10/07/2020 02:18  Sodium Latest Ref Range: 135 - 145 mmol/L 138  Potassium Latest Ref Range: 3.5 - 5.1 mmol/L 4.1  Chloride Latest Ref Range: 98 - 111 mmol/L 101  CO2 Latest Ref Range: 22 - 32 mmol/L 28  Glucose Latest Ref Range: 70 - 99 mg/dL 148 (H)  BUN Latest Ref Range: 8 - 23 mg/dL 18  Creatinine Latest Ref Range: 0.44 - 1.00 mg/dL 1.12 (H)  Calcium Latest Ref Range: 8.9 - 10.3 mg/dL 9.1  Anion gap Latest Ref Range: 5 - 15  9  Alkaline Phosphatase Latest Ref Range: 38 - 126 U/L 58  Albumin Latest Ref Range: 3.5 - 5.0 g/dL 3.8  AST Latest Ref Range: 15 - 41 U/L 22  ALT Latest Ref Range: 0 - 44 U/L 18  Total Protein Latest Ref Range: 6.5 - 8.1 g/dL 8.5 (H)  Total Bilirubin Latest Ref Range: 0.3 - 1.2 mg/dL 0.5  GFR, Estimated Latest Ref Range: >60 mL/min 52 (L)  WBC Latest Ref Range: 4.0 - 10.5 K/uL 6.7  RBC Latest Ref Range: 3.87 - 5.11 MIL/uL 3.65 (L)  Hemoglobin Latest Ref Range: 12.0 - 15.0 g/dL 10.2 (L)  HCT Latest Ref Range: 36.0 - 46.0 % 32.8 (L)  MCV Latest Ref Range: 80.0 - 100.0 fL 89.9  MCH Latest Ref Range: 26.0 - 34.0 pg 27.9  MCHC Latest Ref Range: 30.0 - 36.0 g/dL 31.1  RDW Latest Ref Range:  11.5 - 15.5 % 13.9  Platelets Latest Ref Range: 150 - 400 K/uL 218  nRBC Latest Ref Range: 0.0 - 0.2 % 0.0  Neutrophils Latest Units: % 74     ALLERGIES: No Known Allergies    PERTINENT MEDICATIONS:  Outpatient Encounter Medications as of 10/22/2020  Medication Sig  . albuterol (PROVENTIL) (2.5 MG/3ML) 0.083% nebulizer solution Take 2.5 mg by nebulization every 4 (four) hours as needed for wheezing or shortness of breath.  Marland Kitchen albuterol (VENTOLIN HFA) 108 (90 Base) MCG/ACT inhaler Inhale 1 puff into the lungs every 6 (six) hours as needed for shortness of breath.   Marland Kitchen ammonium lactate (LAC-HYDRIN) 12 % lotion Apply 1 application topically as needed for dry skin. Apply cream tp entire body and face topically every morning and apply generously to face torso and legs twice daily  . apixaban (ELIQUIS) 5 MG TABS tablet Take 1 tablet (5 mg total)  by mouth 2 (two) times daily.  . Ascorbic Acid (VITAMIN C) 100 MG tablet Take 100 mg by mouth daily.  . B Complex-Biotin-FA (VITAMIN B50 COMPLEX PO) Take 1 tablet by mouth daily.  . bictegravir-emtricitabine-tenofovir AF (BIKTARVY) 50-200-25 MG TABS tablet Take 1 tablet by mouth daily.  . diclofenac Sodium (VOLTAREN) 1 % GEL Apply 4 g topically 2 (two) times daily as needed for pain.  Marland Kitchen doxycycline (VIBRAMYCIN) 100 MG capsule Take 1 capsule (100 mg total) by mouth 2 (two) times daily. One po bid x 7 days  . Fluticasone-Salmeterol (ADVAIR) 500-50 MCG/DOSE AEPB Inhale 1 puff into the lungs 2 (two) times daily.  Marland Kitchen gabapentin (NEURONTIN) 800 MG tablet Take 800 mg by mouth 3 (three) times daily.  . hydrOXYzine (ATARAX/VISTARIL) 25 MG tablet Take 25 mg by mouth every 6 (six) hours.  Marland Kitchen ibuprofen (ADVIL) 200 MG tablet Take 800 mg by mouth every 6 (six) hours as needed for moderate pain.  . Melatonin 3 MG CAPS Take 1 capsule by mouth at bedtime.  . montelukast (SINGULAIR) 10 MG tablet Take 10 mg by mouth at bedtime.   . polyethylene glycol (MIRALAX / GLYCOLAX) 17 g  packet Take 17 g by mouth 2 (two) times daily.  . predniSONE (DELTASONE) 20 MG tablet 2 tabs po daily x 4 days  . QUEtiapine Fumarate (SEROQUEL XR) 150 MG 24 hr tablet Take 150 mg by mouth at bedtime.  Marland Kitchen rOPINIRole (REQUIP) 1 MG tablet Take 1 tablet (1 mg total) by mouth at bedtime.  . Skin Protectants, Misc. (EUCERIN) cream Apply 1 application topically See admin instructions. Apply cream to the entire body after bath every day  . sodium chloride (OCEAN) 0.65 % SOLN nasal spray Place 1 spray into both nostrils as needed for congestion.  Marland Kitchen SPIRIVA RESPIMAT 2.5 MCG/ACT AERS INHALE 2 PUFFS INTO THE LUNGS DAILY (Patient taking differently: Inhale 2 puffs into the lungs daily.)  . tiZANidine (ZANAFLEX) 4 MG tablet Take 4 mg by mouth 2 (two) times daily as needed for muscle spasms.   . traMADol (ULTRAM) 50 MG tablet Take 50 mg by mouth every 8 (eight) hours as needed (pain).   Marland Kitchen venlafaxine XR (EFFEXOR-XR) 150 MG 24 hr capsule Take 150 mg by mouth daily.   No facility-administered encounter medications on file as of 10/22/2020.      ROS  General: NAD, appropriately dressed Constitution: Denies fever/chills EYES: denies vision changes ENMT: denies Xerostomia Cardiovascular: denies chest pain Pulmonary: denies  cough, endorses dyspnea  Abdomen: endorses fair appetite, denies constipation or diarrhea GU: denies dysuria MSK:  endorses ROM limitations, no falls reported Skin: denies rashes/bruising Neurological: endorses weakness, denies pain, denies insomnia Psych: Endorses positive mood Heme/lymph/immuno: denies bruises, no abnormal bleeding   PHYSICAL EXAM  General: In no acute distress, appropriately dressed Cardiovascular: regular rate and rhythm; no edema in BLE Pulmonary: no cough, increased work of breathing, expiratory wheeze; respiratory status improved with Albuterol breathing treatment Abdomen: soft, non tender, no guarding, positive bowel sounds in all quadrants GU:  no  suprapubic tenderness Eyes: Normal lids, no discharge, sclera anicteric ENMT: Moist mucous membranes Musculoskeletal:  weakness, sarcopenia Skin: no rash to visible skin, warm without cyanosis,  Psych: non-anxious affect Neurological: Weakness but otherwise non focal Heme/lymph/immuno: no bruises, no bleeding  Thank you for the opportunity to participate in the care of BENTLEY HARALSON Please call our office at [phone] if we can be of additional assistance.  Note: Portions of this note were generated  with Lobbyist. Dictation errors may occur despite best attempts at proofreading.  Teodoro Spray, NP

## 2020-12-13 ENCOUNTER — Non-Acute Institutional Stay: Payer: Medicare Other | Admitting: Hospice

## 2020-12-13 ENCOUNTER — Other Ambulatory Visit: Payer: Self-pay

## 2020-12-13 DIAGNOSIS — J449 Chronic obstructive pulmonary disease, unspecified: Secondary | ICD-10-CM

## 2020-12-13 DIAGNOSIS — Z515 Encounter for palliative care: Secondary | ICD-10-CM

## 2020-12-13 DIAGNOSIS — R451 Restlessness and agitation: Secondary | ICD-10-CM

## 2020-12-13 NOTE — Progress Notes (Signed)
[facility] Consult Note Telephone: [phone]  Fax: [phone]  PATIENT NAME: [name] DOB: 1945-11-15 MRN: [id number]  PRIMARY CARE PROVIDER:   Nolene Ebbs, MD Nolene Ebbs, MD [address] [place],  [place] [address]  REFERRING PROVIDER: Martinique Miller, NP  RESPONSIBLE PARTY: Self Contact Information     Name Relation Home Work Mobile   [place] Sister [phone]     [place], [place]   [phone]       Visit is to build trust and highlight Palliative Medicine as specialized medical care for people living with serious illness, aimed at facilitating better quality of life through symptoms relief, assisting with advance care planning and complex medical decision making. This is a follow up visit.  RECOMMENDATIONS/PLAN: Patient is a DO NOT RESUSCITATE  Advance Care Planning/Code Status:  Goals of Care: Goals of care include to maximize quality of life and symptom management.  Visit consisted of counseling and education dealing with the complex and emotionally intense issues of symptom management and palliative care in the setting of serious and potentially life-threatening illness. Palliative care team will continue to support patient, patient's family, and medical team.  Symptom management/Plan:   Follow up: Palliative care will continue to follow for complex medical decision making, advance care planning, and clarification of goals. Return 6 weeks or prn. Encouraged to call provider sooner with any concerns.  CHIEF COMPLAINT: Palliative follow up COPD: Patient continues with breathing treatments-albuterol, Spiriva, Advair.  No hospitalization or exacerbation since last visit.  Oxygen supplementation as needed.  Use fan for air circulation as needed. Agitation/delusional behavior: Followed by psych.  Patient continues on quetiapine; also on buspirone; on venlafaxine for major depressive disorder.   Encouraged de-escalation techniques, redirection.  HISTORY OF PRESENT ILLNESS:  [name] a 75 y.o. female with multiple medical problems including COPD with no recent exacerbations since last visit; compliant with breathing treatments.  History of HIV, major depressive disorder, lung cancer, stroke.  History obtained from review of EMR, discussion with primary team, family and/or patient. Records reviewed and summarized above. All 10 point systems reviewed and are negative except as documented in history of present illness above  Review and summarization of Epic records shows history from other than patient.   Palliative Care was asked to follow this patient o help address complex decision making in the context of advance care planning and goals of care clarification.   PHYSICAL EXAM  General: In no acute distress Cardiovascular: regular rate and rhythm; no edema in BLE Pulmonary: no cough, no increased work of breathing, normal respiratory effort; oxygen supplementation Abdomen: soft, non tender, no guarding, positive bowel sounds in all quadrants GU:  no suprapubic tenderness Eyes: Normal lids, no discharge ENMT: Moist mucous membranes Musculoskeletal:  weaknes Skin: no rash to visible skin, warm without cyanosis,  Psych: non-anxious affect Neurological: Weakness but otherwise non focal Heme/lymph/immuno: no bruises, no bleeding  PERTINENT MEDICATIONS:  Outpatient Encounter Medications as of 12/13/2020  Medication Sig   albuterol (PROVENTIL) (2.5 MG/3ML) 0.083% nebulizer solution Take 2.5 mg by nebulization every 4 (four) hours as needed for wheezing or shortness of breath.   albuterol (VENTOLIN HFA) 108 (90 Base) MCG/ACT inhaler Inhale 1 puff into the lungs every 6 (six) hours as needed for shortness of breath.    ammonium lactate (LAC-HYDRIN) 12 % lotion Apply 1 application topically as needed for dry skin. Apply cream tp entire body and face topically every morning and apply  generously to face torso and legs twice daily   apixaban (ELIQUIS) 5 MG TABS tablet Take 1 tablet (5 mg total) by mouth 2 (two) times daily.   Ascorbic Acid (VITAMIN C) 100 MG tablet Take 100 mg by mouth daily.   B Complex-Biotin-FA (VITAMIN B50 COMPLEX PO) Take 1 tablet by mouth daily.   bictegravir-emtricitabine-tenofovir AF (BIKTARVY) 50-200-25 MG TABS tablet Take 1 tablet by mouth daily.   diclofenac Sodium (VOLTAREN) 1 % GEL Apply 4 g topically 2 (two) times daily as needed for pain.   doxycycline (VIBRAMYCIN) 100 MG capsule Take 1 capsule (100 mg total) by mouth 2 (two) times daily. One po bid x 7 days   Fluticasone-Salmeterol (ADVAIR) 500-50 MCG/DOSE AEPB Inhale 1 puff into the lungs 2 (two) times daily.   gabapentin (NEURONTIN) 800 MG tablet Take 800 mg by mouth 3 (three) times daily.   hydrOXYzine (ATARAX/VISTARIL) 25 MG tablet Take 25 mg by mouth every 6 (six) hours.   ibuprofen (ADVIL) 200 MG tablet Take 800 mg by mouth every 6 (six) hours as needed for moderate pain.   Melatonin 3 MG CAPS Take 1 capsule by mouth at bedtime.   montelukast (SINGULAIR) 10 MG tablet Take 10 mg by mouth at bedtime.    polyethylene glycol (MIRALAX / GLYCOLAX) 17 g packet Take 17 g by mouth 2 (two) times daily.   predniSONE (DELTASONE) 20 MG tablet 2 tabs po daily x 4 days   QUEtiapine Fumarate (SEROQUEL XR) 150 MG 24 hr tablet Take 150 mg by mouth at bedtime.   rOPINIRole (REQUIP) 1 MG tablet Take 1 tablet (1 mg total) by mouth at bedtime.   Skin Protectants, Misc. (EUCERIN) cream Apply 1 application topically See admin instructions. Apply cream to the entire body after bath every day   sodium chloride (OCEAN) 0.65 % SOLN nasal spray Place 1 spray into both nostrils as needed for congestion.   SPIRIVA RESPIMAT 2.5 MCG/ACT AERS INHALE 2 PUFFS INTO THE LUNGS DAILY (Patient taking differently: Inhale 2 puffs into the lungs daily.)   tiZANidine (ZANAFLEX) 4 MG tablet Take 4 mg by mouth 2 (two) times daily as  needed for muscle spasms.    traMADol (ULTRAM) 50 MG tablet Take 50 mg by mouth every 8 (eight) hours as needed (pain).    venlafaxine XR (EFFEXOR-XR) 150 MG 24 hr capsule Take 150 mg by mouth daily.   No facility-administered encounter medications on file as of 12/13/2020.    HOSPICE ELIGIBILITY/DIAGNOSIS: TBD  PAST MEDICAL HISTORY:  Past Medical History:  Diagnosis Date   Asthma    Atypical squamous cells of undetermined significance (ASCUS) on Papanicolaou smear of cervix    Bed bug bite 07/12/2019   Cancer ([facility])    COPD (chronic obstructive pulmonary disease) ([facility])    H/O drainage of abscess    left axilla, from left breast   Hep B w/o coma    HIV (human immunodeficiency virus infection) ([place])    Hyperlipidemia    Lung cancer ([place]) 12/14/13   LUL Adenocarcinoma   Neuromuscular disorder ([facility])    fingers/feet neuropathy   Non-small cell lung cancer ([place])    Renal disorder    S/P radiation therapy 01/26/14-02/02/14   sbrt  lt upper lung-54Gy/75fx   Stroke ([facility])      ALLERGIES: No Known Allergies    I spent  50 minutes providing this consultation; this includes time spent with patient/family, chart review and documentation. More than 50% of the time in this consultation  was spent on counseling and coordinating communication   Thank you for the opportunity to participate in the care of REGLA FITZGIBBON Please call our office at [phone] if we can be of additional assistance.  Note: Portions of this note were generated with Lobbyist. Dictation errors may occur despite best attempts at proofreading.  Teodoro Spray, NP

## 2021-01-03 ENCOUNTER — Emergency Department (HOSPITAL_COMMUNITY): Payer: Medicare HMO

## 2021-01-03 ENCOUNTER — Inpatient Hospital Stay (HOSPITAL_COMMUNITY)
Admission: EM | Admit: 2021-01-03 | Discharge: 2021-01-09 | DRG: 682 | Disposition: A | Discharge status: Skilled Nursing Facility | Payer: Medicare HMO | Source: Skilled Nursing Facility | Attending: Family Medicine | Admitting: Family Medicine

## 2021-01-03 ENCOUNTER — Other Ambulatory Visit: Payer: Self-pay

## 2021-01-03 ENCOUNTER — Encounter (HOSPITAL_COMMUNITY): Payer: Self-pay

## 2021-01-03 DIAGNOSIS — I10 Essential (primary) hypertension: Secondary | ICD-10-CM | POA: Diagnosis present

## 2021-01-03 DIAGNOSIS — R519 Headache, unspecified: Secondary | ICD-10-CM

## 2021-01-03 DIAGNOSIS — D72829 Elevated white blood cell count, unspecified: Secondary | ICD-10-CM | POA: Diagnosis not present

## 2021-01-03 DIAGNOSIS — Z85118 Personal history of other malignant neoplasm of bronchus and lung: Secondary | ICD-10-CM | POA: Diagnosis not present

## 2021-01-03 DIAGNOSIS — J9621 Acute and chronic respiratory failure with hypoxia: Secondary | ICD-10-CM | POA: Diagnosis present

## 2021-01-03 DIAGNOSIS — Z66 Do not resuscitate: Secondary | ICD-10-CM | POA: Diagnosis present

## 2021-01-03 DIAGNOSIS — J441 Chronic obstructive pulmonary disease with (acute) exacerbation: Secondary | ICD-10-CM | POA: Diagnosis present

## 2021-01-03 DIAGNOSIS — Z20822 Contact with and (suspected) exposure to covid-19: Secondary | ICD-10-CM | POA: Diagnosis present

## 2021-01-03 DIAGNOSIS — Z79899 Other long term (current) drug therapy: Secondary | ICD-10-CM | POA: Diagnosis not present

## 2021-01-03 DIAGNOSIS — R7881 Bacteremia: Secondary | ICD-10-CM | POA: Diagnosis not present

## 2021-01-03 DIAGNOSIS — Z923 Personal history of irradiation: Secondary | ICD-10-CM | POA: Diagnosis not present

## 2021-01-03 DIAGNOSIS — G9341 Metabolic encephalopathy: Secondary | ICD-10-CM | POA: Diagnosis present

## 2021-01-03 DIAGNOSIS — N179 Acute kidney failure, unspecified: Secondary | ICD-10-CM | POA: Diagnosis present

## 2021-01-03 DIAGNOSIS — B20 Human immunodeficiency virus [HIV] disease: Secondary | ICD-10-CM | POA: Diagnosis present

## 2021-01-03 DIAGNOSIS — J9611 Chronic respiratory failure with hypoxia: Secondary | ICD-10-CM

## 2021-01-03 DIAGNOSIS — Z833 Family history of diabetes mellitus: Secondary | ICD-10-CM

## 2021-01-03 DIAGNOSIS — G629 Polyneuropathy, unspecified: Secondary | ICD-10-CM | POA: Diagnosis present

## 2021-01-03 DIAGNOSIS — E785 Hyperlipidemia, unspecified: Secondary | ICD-10-CM | POA: Diagnosis present

## 2021-01-03 DIAGNOSIS — Z9981 Dependence on supplemental oxygen: Secondary | ICD-10-CM

## 2021-01-03 DIAGNOSIS — Z8249 Family history of ischemic heart disease and other diseases of the circulatory system: Secondary | ICD-10-CM | POA: Diagnosis not present

## 2021-01-03 DIAGNOSIS — K5900 Constipation, unspecified: Secondary | ICD-10-CM | POA: Diagnosis present

## 2021-01-03 DIAGNOSIS — C7931 Secondary malignant neoplasm of brain: Secondary | ICD-10-CM | POA: Diagnosis present

## 2021-01-03 DIAGNOSIS — I69354 Hemiplegia and hemiparesis following cerebral infarction affecting left non-dominant side: Secondary | ICD-10-CM

## 2021-01-03 DIAGNOSIS — R1084 Generalized abdominal pain: Secondary | ICD-10-CM

## 2021-01-03 DIAGNOSIS — Z21 Asymptomatic human immunodeficiency virus [HIV] infection status: Secondary | ICD-10-CM | POA: Diagnosis present

## 2021-01-03 DIAGNOSIS — R531 Weakness: Secondary | ICD-10-CM

## 2021-01-03 DIAGNOSIS — Z7901 Long term (current) use of anticoagulants: Secondary | ICD-10-CM | POA: Diagnosis not present

## 2021-01-03 DIAGNOSIS — E860 Dehydration: Secondary | ICD-10-CM | POA: Diagnosis present

## 2021-01-03 LAB — CBC WITH DIFFERENTIAL/PLATELET
Abs Immature Granulocytes: 0.27 10*3/uL — ABNORMAL HIGH (ref 0.00–0.07)
Basophils Absolute: 0.1 10*3/uL (ref 0.0–0.1)
Basophils Relative: 0 %
Eosinophils Absolute: 0.1 10*3/uL (ref 0.0–0.5)
Eosinophils Relative: 0 %
HCT: 36.1 % (ref 36.0–46.0)
Hemoglobin: 10.8 g/dL — ABNORMAL LOW (ref 12.0–15.0)
Immature Granulocytes: 2 %
Lymphocytes Relative: 10 %
Lymphs Abs: 1.5 10*3/uL (ref 0.7–4.0)
MCH: 26.6 pg (ref 26.0–34.0)
MCHC: 29.9 g/dL — ABNORMAL LOW (ref 30.0–36.0)
MCV: 88.9 fL (ref 80.0–100.0)
Monocytes Absolute: 0.7 10*3/uL (ref 0.1–1.0)
Monocytes Relative: 5 %
Neutro Abs: 11.6 10*3/uL — ABNORMAL HIGH (ref 1.7–7.7)
Neutrophils Relative %: 83 %
Platelets: 237 10*3/uL (ref 150–400)
RBC: 4.06 MIL/uL (ref 3.87–5.11)
RDW: 15.8 % — ABNORMAL HIGH (ref 11.5–15.5)
WBC: 14.2 10*3/uL — ABNORMAL HIGH (ref 4.0–10.5)
nRBC: 0 % (ref 0.0–0.2)

## 2021-01-03 LAB — LACTIC ACID, PLASMA: Lactic Acid, Venous: 1 mmol/L (ref 0.5–1.9)

## 2021-01-03 LAB — COMPREHENSIVE METABOLIC PANEL
ALT: 21 U/L (ref 0–44)
AST: 24 U/L (ref 15–41)
Albumin: 3.7 g/dL (ref 3.5–5.0)
Alkaline Phosphatase: 68 U/L (ref 38–126)
Anion gap: 12 (ref 5–15)
BUN: 36 mg/dL — ABNORMAL HIGH (ref 8–23)
CO2: 27 mmol/L (ref 22–32)
Calcium: 8.9 mg/dL (ref 8.9–10.3)
Chloride: 100 mmol/L (ref 98–111)
Creatinine, Ser: 3.26 mg/dL — ABNORMAL HIGH (ref 0.44–1.00)
GFR, Estimated: 14 mL/min — ABNORMAL LOW (ref >60)
Glucose, Bld: 106 mg/dL — ABNORMAL HIGH (ref 70–99)
Potassium: 3.6 mmol/L (ref 3.5–5.1)
Sodium: 139 mmol/L (ref 135–145)
Total Bilirubin: 0.8 mg/dL (ref 0.3–1.2)
Total Protein: 8.9 g/dL — ABNORMAL HIGH (ref 6.5–8.1)

## 2021-01-03 LAB — LIPASE, BLOOD: Lipase: 21 U/L (ref 11–51)

## 2021-01-03 LAB — URINALYSIS, ROUTINE W REFLEX MICROSCOPIC
Bilirubin Urine: NEGATIVE
Glucose, UA: NEGATIVE mg/dL
Ketones, ur: 5 mg/dL — AB
Nitrite: NEGATIVE
Protein, ur: 100 mg/dL — AB
Specific Gravity, Urine: 1.016 (ref 1.005–1.030)
WBC, UA: >50 WBC/hpf — ABNORMAL HIGH (ref 0–5)
pH: 5 (ref 5.0–8.0)

## 2021-01-03 LAB — RESP PANEL BY RT-PCR (FLU A&B, COVID) ARPGX2
Influenza A by PCR: NEGATIVE
Influenza B by PCR: NEGATIVE
SARS Coronavirus 2 by RT PCR: NEGATIVE

## 2021-01-03 LAB — PROTIME-INR
INR: 1.6 — ABNORMAL HIGH (ref 0.8–1.2)
Prothrombin Time: 19.1 seconds — ABNORMAL HIGH (ref 11.4–15.2)

## 2021-01-03 LAB — GROUP A STREP BY PCR: Group A Strep by PCR: NOT DETECTED

## 2021-01-03 LAB — APTT: aPTT: 34 seconds (ref 24–36)

## 2021-01-03 MED ORDER — LACTATED RINGERS IV BOLUS (SEPSIS)
1000.0000 mL | Freq: Once | INTRAVENOUS | Status: AC
  Administered 2021-01-03: 1000 mL via INTRAVENOUS

## 2021-01-03 MED ORDER — GADOBUTROL 1 MMOL/ML IV SOLN
9.0000 mL | Freq: Once | INTRAVENOUS | Status: AC | PRN
  Administered 2021-01-03: 9 mL via INTRAVENOUS

## 2021-01-03 MED ORDER — LACTATED RINGERS IV SOLN: INTRAVENOUS | Status: DC

## 2021-01-03 MED ORDER — FENTANYL CITRATE (PF) 100 MCG/2ML IJ SOLN
100.0000 ug | Freq: Once | INTRAMUSCULAR | Status: AC
  Administered 2021-01-03: 100 ug via INTRAVENOUS
  Filled 2021-01-03: qty 2

## 2021-01-03 MED ORDER — BUSPIRONE HCL 5 MG PO TABS
2.5000 mg | ORAL_TABLET | Freq: Three times a day (TID) | ORAL | Status: DC
  Administered 2021-01-03 – 2021-01-09 (×17): 2.5 mg via ORAL
  Filled 2021-01-03 (×17): qty 1

## 2021-01-03 MED ORDER — MOMETASONE FURO-FORMOTEROL FUM 200-5 MCG/ACT IN AERO
2.0000 | INHALATION_SPRAY | Freq: Two times a day (BID) | RESPIRATORY_TRACT | Status: DC
  Administered 2021-01-04 – 2021-01-09 (×9): 2 via RESPIRATORY_TRACT
  Filled 2021-01-03 (×2): qty 8.8

## 2021-01-03 MED ORDER — OXYCODONE HCL 5 MG PO TABS
5.0000 mg | ORAL_TABLET | Freq: Three times a day (TID) | ORAL | Status: DC | PRN
  Administered 2021-01-04 – 2021-01-07 (×6): 5 mg via ORAL
  Filled 2021-01-03 (×6): qty 1

## 2021-01-03 MED ORDER — APIXABAN 5 MG PO TABS
5.0000 mg | ORAL_TABLET | Freq: Two times a day (BID) | ORAL | Status: DC
  Administered 2021-01-03 – 2021-01-09 (×12): 5 mg via ORAL
  Filled 2021-01-03 (×11): qty 1
  Filled 2021-01-03: qty 2

## 2021-01-03 MED ORDER — SIMETHICONE 80 MG PO CHEW
80.0000 mg | CHEWABLE_TABLET | Freq: Four times a day (QID) | ORAL | Status: DC | PRN
Start: 2021-01-03 — End: 2021-01-09

## 2021-01-03 MED ORDER — IPRATROPIUM-ALBUTEROL 0.5-2.5 (3) MG/3ML IN SOLN
3.0000 mL | Freq: Four times a day (QID) | RESPIRATORY_TRACT | Status: DC
  Administered 2021-01-03 – 2021-01-04 (×3): 3 mL via RESPIRATORY_TRACT
  Filled 2021-01-03 (×3): qty 3

## 2021-01-03 MED ORDER — LACTATED RINGERS IV SOLN: INTRAVENOUS | Status: AC

## 2021-01-03 MED ORDER — VENLAFAXINE HCL ER 150 MG PO CP24
150.0000 mg | ORAL_CAPSULE | Freq: Every day | ORAL | Status: DC
  Administered 2021-01-04 – 2021-01-09 (×6): 150 mg via ORAL
  Filled 2021-01-03 (×6): qty 1

## 2021-01-03 MED ORDER — SENNOSIDES-DOCUSATE SODIUM 8.6-50 MG PO TABS
1.0000 | ORAL_TABLET | Freq: Every day | ORAL | Status: DC
  Administered 2021-01-03 – 2021-01-08 (×6): 1 via ORAL
  Filled 2021-01-03 (×6): qty 1

## 2021-01-03 MED ORDER — ROPINIROLE HCL 1 MG PO TABS
1.0000 mg | ORAL_TABLET | Freq: Every day | ORAL | Status: DC
  Administered 2021-01-03 – 2021-01-08 (×6): 1 mg via ORAL
  Filled 2021-01-03 (×6): qty 1

## 2021-01-03 MED ORDER — POLYETHYLENE GLYCOL 3350 17 G PO PACK
17.0000 g | PACK | Freq: Two times a day (BID) | ORAL | Status: DC
  Administered 2021-01-03 – 2021-01-08 (×9): 17 g via ORAL
  Filled 2021-01-03 (×10): qty 1

## 2021-01-03 MED ORDER — DEXAMETHASONE SODIUM PHOSPHATE 10 MG/ML IJ SOLN
10.0000 mg | Freq: Once | INTRAMUSCULAR | Status: AC
  Administered 2021-01-03: 10 mg via INTRAVENOUS
  Filled 2021-01-03: qty 1

## 2021-01-03 MED ORDER — LORAZEPAM 0.5 MG PO TABS
0.5000 mg | ORAL_TABLET | Freq: Once | ORAL | Status: AC
  Administered 2021-01-03: 0.5 mg via ORAL
  Filled 2021-01-03: qty 1

## 2021-01-03 MED ORDER — QUETIAPINE FUMARATE ER 50 MG PO TB24
150.0000 mg | ORAL_TABLET | Freq: Every day | ORAL | Status: DC
  Administered 2021-01-03 – 2021-01-08 (×6): 150 mg via ORAL
  Filled 2021-01-03 (×6): qty 3

## 2021-01-03 MED ORDER — GUAIFENESIN ER 600 MG PO TB12
600.0000 mg | ORAL_TABLET | Freq: Two times a day (BID) | ORAL | Status: DC
  Administered 2021-01-03 – 2021-01-09 (×12): 600 mg via ORAL
  Filled 2021-01-03 (×12): qty 1

## 2021-01-03 MED ORDER — MONTELUKAST SODIUM 10 MG PO TABS
10.0000 mg | ORAL_TABLET | Freq: Every day | ORAL | Status: DC
  Administered 2021-01-03 – 2021-01-08 (×6): 10 mg via ORAL
  Filled 2021-01-03 (×6): qty 1

## 2021-01-03 MED ORDER — ALBUTEROL SULFATE (2.5 MG/3ML) 0.083% IN NEBU: 2.5000 mg | INHALATION_SOLUTION | RESPIRATORY_TRACT | Status: DC | PRN

## 2021-01-03 MED ORDER — BICTEGRAVIR-EMTRICITAB-TENOFOV 50-200-25 MG PO TABS
1.0000 | ORAL_TABLET | Freq: Every day | ORAL | Status: DC
  Administered 2021-01-04 – 2021-01-09 (×6): 1 via ORAL
  Filled 2021-01-03 (×6): qty 1

## 2021-01-03 MED ORDER — LIDOCAINE 5 % EX PTCH
1.0000 | MEDICATED_PATCH | Freq: Every day | CUTANEOUS | Status: DC
  Administered 2021-01-04 – 2021-01-08 (×5): 1 via TRANSDERMAL
  Filled 2021-01-03 (×6): qty 1

## 2021-01-03 NOTE — H&P (Signed)
History and Physical    [name] OEV:[id number] DOB: [birth year] DOA: 01/03/2021  PCP: Nolene Ebbs, MD  Patient coming from: [facility]  Chief Complaint: weakness, poor oral intake  HPI: [name] is a 75 y.o. female with medical history significant of COPD on 2L O2, lung cancer w/ mets to brain, HIV, HLD. Presenting with generalized weakness and fever. She reports that she has not had much of an appetite in the last several days. She denies N/V. She denies chills. She has noted increased headache and fatigue. Her facility reports that she had fever up to 102 over the last several days. She's had O2 sats down into the 80's. She's required an increase of her chronic O2 support. They became concerned and sent her to the ED for eval.  ED Course: CXR was negative. CTH was concerning for new tumor. An MRI was obtained. She was noted to be in AKI. Fluids were started. UA and Ucx are still pending. TRH was called for admission.   Review of Systems:  Denies CP, dyspnea, palpitations, N/V/D. Reports headache and cough. Review of systems is otherwise negative for all not mentioned in HPI.   PMHx Past Medical History:  Diagnosis Date   Asthma    Atypical squamous cells of undetermined significance (ASCUS) on Papanicolaou smear of cervix    Bed bug bite 07/12/2019   Cancer (HCC)    COPD (chronic obstructive pulmonary disease) (HCC)    H/O drainage of abscess    left axilla, from left breast   Hep B w/o coma    HIV (human immunodeficiency virus infection) ([place])    Hyperlipidemia    Lung cancer ([place]) 12/14/13   LUL Adenocarcinoma   Neuromuscular disorder (HCC)    fingers/feet neuropathy   Non-small cell lung cancer ([place])    Renal disorder    S/P radiation therapy 01/26/14-02/02/14   sbrt  lt upper lung-54Gy/49fx   Stroke [facility])     PSHx Past Surgical History:  Procedure Laterality Date   ABCESS DRAINAGE     abcess under Left arm, abcess from L breast   APPLICATION  OF CRANIAL NAVIGATION Right 11/28/2019   Procedure: APPLICATION OF CRANIAL NAVIGATION;  Surgeon: [name], MD;  Location: [place];  Service: Neurosurgery;  Laterality: Right;   BRAIN SURGERY     BUBBLE STUDY  03/08/2019   Procedure: BUBBLE STUDY;  Surgeon: [name], MD;  Location: [place];  Service: Cardiovascular;;   CRANIOTOMY Right 11/28/2019   Procedure: Right Craniotomy for Tumor Resection;  Surgeon: [name], MD;  Location: [place];  Service: Neurosurgery;  Laterality: Right;   CRANIOTOMY     removal of abnormal cells on uterus     TEE WITHOUT CARDIOVERSION N/A 03/08/2019   Procedure: TRANSESOPHAGEAL ECHOCARDIOGRAM (TEE);  Surgeon: [name], MD;  Location: [facility];  Service: Cardiovascular;  Laterality: N/A;   thyroid gland removed      SocHx  reports that she has never smoked. She has never used smokeless tobacco. She reports previous alcohol use. She reports previous drug use.  No Known Allergies  FamHx Family History  Problem Relation Age of Onset   Pneumonia Mother    Hypertension Sister    Diabetes Sister    Cancer Sister        pancreatic and lung   Cancer Brother        pancreatic   Cancer Brother        pancreatic    Prior  to Admission medications   Medication Sig Start Date End Date Taking? Authorizing Provider  albuterol (PROVENTIL) (2.5 MG/3ML) 0.083% nebulizer solution Take 2.5 mg by nebulization every 4 (four) hours as needed for wheezing or shortness of breath.    [provider]  albuterol (VENTOLIN HFA) 108 (90 Base) MCG/ACT inhaler Inhale 1 puff into the lungs every 6 (six) hours as needed for shortness of breath.  12/23/19   [provider]  ammonium lactate (LAC-HYDRIN) 12 % lotion Apply 1 application topically as needed for dry skin. Apply cream tp entire body and face topically every morning and apply generously to face torso and legs twice daily    [provider]  apixaban (ELIQUIS) 5 MG  TABS tablet Take 1 tablet (5 mg total) by mouth 2 (two) times daily. 03/13/19   Hosie Poisson, MD  Ascorbic Acid (VITAMIN C) 100 MG tablet Take 100 mg by mouth daily.    [provider]  B Complex-Biotin-FA (VITAMIN B50 COMPLEX PO) Take 1 tablet by mouth daily.    [provider]  bictegravir-emtricitabine-tenofovir AF (BIKTARVY) 50-200-25 MG TABS tablet Take 1 tablet by mouth daily. 07/12/19   Truman Hayward, MD  diclofenac Sodium (VOLTAREN) 1 % GEL Apply 4 g topically 2 (two) times daily as needed for pain. 11/09/19   [provider]  doxycycline (VIBRAMYCIN) 100 MG capsule Take 1 capsule (100 mg total) by mouth 2 (two) times daily. One po bid x 7 days 10/07/20   Mesner, Corene Cornea, MD  Fluticasone-Salmeterol (ADVAIR) 500-50 MCG/DOSE AEPB Inhale 1 puff into the lungs 2 (two) times daily. 09/04/19   [provider]  gabapentin (NEURONTIN) 800 MG tablet Take 800 mg by mouth 3 (three) times daily. 10/26/19   [provider]  hydrOXYzine (ATARAX/VISTARIL) 25 MG tablet Take 25 mg by mouth every 6 (six) hours. 08/03/19   [provider]  ibuprofen (ADVIL) 200 MG tablet Take 800 mg by mouth every 6 (six) hours as needed for moderate pain.    [provider]  Melatonin 3 MG CAPS Take 1 capsule by mouth at bedtime.    [provider]  montelukast (SINGULAIR) 10 MG tablet Take 10 mg by mouth at bedtime.  09/04/19   [provider]  polyethylene glycol (MIRALAX / GLYCOLAX) 17 g packet Take 17 g by mouth 2 (two) times daily.    [provider]  predniSONE (DELTASONE) 20 MG tablet 2 tabs po daily x 4 days 10/08/20   Mesner, Corene Cornea, MD  QUEtiapine Fumarate (SEROQUEL XR) 150 MG 24 hr tablet Take 150 mg by mouth at bedtime. 11/09/19   [provider]  rOPINIRole (REQUIP) 1 MG tablet Take 1 tablet (1 mg total) by mouth at bedtime. 05/20/19   Ward, Delice Bison, DO  Skin Protectants, Misc. (EUCERIN) cream Apply 1 application topically  See admin instructions. Apply cream to the entire body after bath every day    [provider]  sodium chloride (OCEAN) 0.65 % SOLN nasal spray Place 1 spray into both nostrils as needed for congestion. 10/07/20   Mesner, Corene Cornea, MD  SPIRIVA RESPIMAT 2.5 MCG/ACT AERS INHALE 2 PUFFS INTO THE LUNGS DAILY Patient taking differently: Inhale 2 puffs into the lungs daily. 09/24/14   Rigoberto Noel, MD  tiZANidine (ZANAFLEX) 4 MG tablet Take 4 mg by mouth 2 (two) times daily as needed for muscle spasms.  11/08/19   [provider]  traMADol (ULTRAM) 50 MG tablet Take 50 mg by  mouth every 8 (eight) hours as needed (pain).  12/11/19   [provider]  venlafaxine XR (EFFEXOR-XR) 150 MG 24 hr capsule Take 150 mg by mouth daily. 11/09/19   [provider]    Physical Exam: Vitals:   01/03/21 1115 01/03/21 1215 01/03/21 1230 01/03/21 1300  BP:  117/67 107/76 128/74  Pulse:  93    Resp: 20 (!) 22 17 18   Temp:      TempSrc:      SpO2:  98%    Weight:      Height:        General: 75 y.o. female resting in bed in NAD Eyes: PERRL, normal sclera ENMT: Nares patent w/o discharge, orophaynx clear, dentition normal, ears w/o discharge/lesions/ulcers Neck: Supple, trachea midline Cardiovascular: RRR, +S1, S2, no m/g/r, equal pulses throughout Respiratory: CTABL, no w/r/r, normal WOB GI: BS+, NDNT, no masses noted, no organomegaly noted MSK: No e/c/c Neuro: A&O x 2 (name, place only), no focal deficits Psyc: calm/cooperative  Labs on Admission: I have personally reviewed following labs and imaging studies  CBC: Recent Labs  Lab 01/03/21 1028  WBC 14.2*  NEUTROABS 11.6*  HGB 10.8*  HCT 36.1  MCV 88.9  PLT 875   Basic Metabolic Panel: Recent Labs  Lab 01/03/21 1028  NA 139  K 3.6  CL 100  CO2 27  GLUCOSE 106*  BUN 36*  CREATININE 3.26*  CALCIUM 8.9   GFR: Estimated Creatinine Clearance: 19.3 mL/min (A) (by C-G formula based on SCr of 3.26 mg/dL  (H)). Liver Function Tests: Recent Labs  Lab 01/03/21 1028  AST 24  ALT 21  ALKPHOS 68  BILITOT 0.8  PROT 8.9*  ALBUMIN 3.7   Recent Labs  Lab 01/03/21 1028  LIPASE 21   No results for input(s): AMMONIA in the last 168 hours. Coagulation Profile: Recent Labs  Lab 01/03/21 1028  INR 1.6*   Cardiac Enzymes: No results for input(s): CKTOTAL, CKMB, CKMBINDEX, TROPONINI in the last 168 hours. BNP (last 3 results) No results for input(s): PROBNP in the last 8760 hours. HbA1C: No results for input(s): HGBA1C in the last 72 hours. CBG: No results for input(s): GLUCAP in the last 168 hours. Lipid Profile: No results for input(s): CHOL, HDL, LDLCALC, TRIG, CHOLHDL, LDLDIRECT in the last 72 hours. Thyroid Function Tests: No results for input(s): TSH, T4TOTAL, FREET4, T3FREE, THYROIDAB in the last 72 hours. Anemia Panel: No results for input(s): VITAMINB12, FOLATE, FERRITIN, TIBC, IRON, RETICCTPCT in the last 72 hours. Urine analysis:    Component Value Date/Time   COLORURINE YELLOW 01/10/2020 0030   APPEARANCEUR CLEAR 01/10/2020 0030   LABSPEC 1.020 01/10/2020 0030   PHURINE 5.0 01/10/2020 0030   GLUCOSEU NEGATIVE 01/10/2020 0030   HGBUR NEGATIVE 01/10/2020 0030   BILIRUBINUR NEGATIVE 01/10/2020 0030   KETONESUR 20 (A) 01/10/2020 0030   PROTEINUR 30 (A) 01/10/2020 0030   UROBILINOGEN 0.2 03/28/2015 1227   NITRITE NEGATIVE 01/10/2020 0030   LEUKOCYTESUR NEGATIVE 01/10/2020 0030    Radiological Exams on Admission: CT ABDOMEN PELVIS WO CONTRAST  Result Date: 01/03/2021 CLINICAL DATA:  Abdominal pain. Decreased oral intake for 3 days EXAM: CT ABDOMEN AND PELVIS WITHOUT CONTRAST TECHNIQUE: Multidetector CT imaging of the abdomen and pelvis was performed following the standard protocol without IV contrast. COMPARISON:  None. FINDINGS: Lower chest: No acute abnormality. Mild bibasilar atelectasis. Hepatobiliary: No focal liver abnormality is seen. No gallstones, gallbladder  wall thickening, or biliary dilatation. Pancreas: Unremarkable. No pancreatic ductal dilatation or  surrounding inflammatory changes. Spleen: Normal in size without focal abnormality. Adrenals/Urinary Tract: Adrenal glands are unremarkable. Kidneys are normal, without renal calculi, focal lesion, or hydronephrosis. Bladder is unremarkable. Stomach/Bowel: No bowel dilatation to suggest bowel obstruction. No bowel wall thickening or inflammatory changes. Normal decompressed stomach. Normal appendix. Large amount of stool in the rectum which is dilated measuring up to 10 cm. Vascular/Lymphatic: Normal caliber abdominal aorta with mild atherosclerosis. No lymphadenopathy. Reproductive: No adnexal mass or focal abnormality. Other: No abdominal wall hernia or abnormality. No abdominopelvic ascites. Musculoskeletal: No acute osseous abnormality. No aggressive osseous lesion. Severe bilateral facet arthropathy of the lumbar spine. 2 mm retrolisthesis of L5 on S1. Bilateral foraminal stenosis at L5-S1. IMPRESSION: 1. Severe rectal fecal impaction with the rectum dilated up to 10 cm. No bowel obstruction. 2.  Aortic Atherosclerosis (ICD10-I70.0). 3. Lumbar spine spondylosis. Electronically Signed   By: Kathreen Devoid   On: 01/03/2021 12:41   CT Head Wo Contrast  Result Date: 01/03/2021 CLINICAL DATA:  Delirium EXAM: CT HEAD WITHOUT CONTRAST TECHNIQUE: Contiguous axial images were obtained from the base of the skull through the vertex without intravenous contrast. COMPARISON:  MRI head September 05, 2020. 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 While evaluation is somewhat limited across modalities, white matter edema in the high right frontoparietal region may be increased compared to recent MRI from September 05, 2020, appearing to extend farther anteriorly in the frontal lobe. Amorphous hyperdensity at the vertex on the right, likely represents the areas of enhancement seen on prior MRI  with limited evaluation on this noncontrast head CT. Similar encephalomalacia in the lateral right temporal lobe, compatible with prior hemorrhagic infarct. No evidence of acute hemorrhage. No midline shift. Similar atrophy with ex vacuo ventricular dilation. No hydrocephalus. No visible extra-axial fluid collections Vascular: No hyperdense vessel identified. Skull: Prior high right craniotomy.  No acute fracture. Sinuses/Orbits: Visualized sinuses are clear. No acute orbital findings. Other: No mastoid effusions. IMPRESSION: 1. Prior right craniotomy for high right convexity tumor resection. While evaluation is somewhat limited across modalities, white matter edema in the high right frontoparietal region may be increased compared to recent MRI from September 05, 2020, appearing to extend further anteriorly in the frontal lobe. Recommend an MRI with contrast to further characterize and to allow for more direct comparison. 2. Amorphous hyperdensity at the vertex on the right, likely represents the areas of enhancement seen on prior MRI with limited evaluation on this noncontrast head CT. 3. Similar encephalomalacia in the lateral right temporal lobe, compatible with prior hemorrhagic infarct. Electronically Signed   By: Margaretha Sheffield MD   On: 01/03/2021 13:01   DG Chest Port 1 View  Result Date: 01/03/2021 CLINICAL DATA:  Generalized weakness with decreased oral intake for 3 days. Hypoxemia. Questionable sepsis. EXAM: PORTABLE CHEST 1 VIEW COMPARISON:  Radiographs 10/07/2020 and 01/02/2020.  CT 01/02/2020. FINDINGS: 0924 hours. The heart size and mediastinal contours are stable. There is stable scarring at both lung bases. The lungs are otherwise clear. No pleural effusion or pneumothorax. Chronic left rib deformities appear unchanged. No acute osseous findings are evident. IMPRESSION: Stable chest.  No acute cardiopulmonary process identified. Electronically Signed   By: Richardean Sale M.D.   On: 01/03/2021  10:07    EKG: Independently reviewed. Sinus, no st elevations; prolonged Qt  Assessment/Plan AKI Poor PO intake     - admit to inpt, tele     - fluids     - CT  ab/pelvis w/o renal abnormality  Abdominal pain Constipation     - BM regimen     - may need enema     - fecal disimpaction done in ED, soft stool noted  Generalized weakness Fever     - no source of infection identified     - denies any urinary symptoms, but UA and Ucx pending.      - she has required more O2, but CXR is clear; we may get a fluffing out of something after she gets some fluids, but her lung exam ok     - COVID/flu negative     - check respiratory viral panel     - no fevers here; hold on abx for right now  COPD exacerbation; on chronic O2     - required increase in O2 supplement     - nebs, guaifenesin, IS     - CXR is clear  HIV     - continue home regimen  Hx of PTE     - continue anticoag  Hx of lung cancer w/ brain mets, s/p brain tumor resection and radiation Headache     - CTH was concerning for possibility of a new tumor in the right frontoparietal lobe. An MRI was obtained showing an enhancement along that region     - I discussed the case with Dr. Lisbeth Renshaw and Dr. Mickeal Skinner. Her case will be brought before the tumor board. Dr. Mickeal Skinner recommends holding off on steroids for right now; I appreciate their assistance  Goals of Care     - pt is A&O x 2; unable to get her to confirm her DNR status     - spoke with family/HCPOA; they have decided to go with FULL code     - will consult PC for goals of care discussion  DVT prophylaxis: SCDs  Code Status: FULL; confirmed with HCPOA  Family Communication: Spoke with Brother, BJ, by phone. He connected me with her HCPOA, Jenny Reichmann.   Consults called: Neuro and Rad Onco   Status is: Inpatient  Remains inpatient appropriate because:Inpatient level of care appropriate due to severity of illness  Dispo: The patient is from: ALF              Anticipated  d/c is to: ALF              Patient currently is not medically stable to d/c.   Difficult to place patient No  Time spent coordinating admission: 70 minutes  [facility]  If 7PM-7AM, please contact night-coverage [email or web address]  01/03/2021, 2:15 PM

## 2021-01-03 NOTE — ED Triage Notes (Signed)
Pt arrived via EMS, from [facility], generalized weakness and decreased oral intake x3 days. Per staff spo2 low 90's, high 80's and fever up to 102 the last couple days.   2L [place] -baseline,  91% for EMS, placed on 4L [place]

## 2021-01-03 NOTE — ED Notes (Signed)
Pt placed on purewic

## 2021-01-03 NOTE — ED Provider Notes (Signed)
[facility] DEPT Provider Note   CSN: [id number] Arrival date & time: 01/03/21  0747     History Chief Complaint  Patient presents with   Weakness    [name] is a 75 y.o. female.  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  She reports for the last several days, she has felt very sleepy and fatigued.  Reports she has had a cough which has been occasional.  Reports headache for several days, as well as abdominal pain, sore throat.  Denies any urinary symptoms, vomiting, diarrhea, congestion.  Reports that she has pain related to her arthritis and body aches which are worse.  Patient was not aware of fevers.  She is not sure what day it is, but no since July, reports that she has just been sleeping for the last several days.   [facility] had reported she had generalized weakness, decreased oral intake for the last 3 days, with fever up to 102, and saturations down to the high 80s.  She is typically on 2 L of oxygen, but she had been increased to 4 L by EMS.   Past Medical History:  Diagnosis Date   Asthma    Atypical squamous cells of undetermined significance (ASCUS) on Papanicolaou smear of cervix    Bed bug bite 07/12/2019   Cancer (HCC)    COPD (chronic obstructive pulmonary disease) (HCC)    H/O drainage of abscess    left axilla, from left breast   Hep B w/o coma    HIV (human immunodeficiency virus infection) ([place])    Hyperlipidemia    Lung cancer ([place]) 12/14/13   LUL Adenocarcinoma   Neuromuscular disorder (HCC)    fingers/feet neuropathy   Non-small cell lung cancer (HCC)    Renal disorder    S/P radiation therapy 01/26/14-02/02/14   sbrt  lt upper lung-54Gy/58fx   Stroke [facility])      Patient Active Problem List   Diagnosis Date Noted   AKI (acute kidney injury) ([place]) 01/03/2021   Brain metastasis ([place]) 05/22/2020   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   HIV (human immunodeficiency virus infection) ([place]) 01/02/2020   Brain mass 01/02/2020   OSA (obstructive sleep apnea) 01/02/2020   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   Cerebral edema (HCC)    Encephalopathy    HIV disease (HCC)    OSA (obstructive sleep apnea)    Dyslipidemia    History of CVA with residual deficit    Brain tumor ([place]) 11/28/2019   Change in mental status 11/21/2019   Bed bug bite 07/12/2019   DNR (do not resuscitate)    Altered mental status    Acute pulmonary embolism without acute cor pulmonale (HCC)    Acute ischemic right MCA stroke [facility])    Palliative care encounter    Aortic embolism or thrombosis (HCC)    CVA (cerebral vascular accident) ([place]) 03/02/2019   COPD (chronic obstructive pulmonary disease) ([place]) 03/01/2019   PE (pulmonary thromboembolism) ([place]) 03/01/2019   Tobacco abuse 03/01/2019   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[place]) 03/01/2019   CKD (chronic kidney disease), stage III ([place]) 03/01/2019   Generalized weakness 03/01/2019   Fall at home    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[facility])    Splenic infarct    Acute and chronic respiratory failure (acute-on-chronic) (  [place]) 02/11/2019   CKD (chronic kidney disease) stage 3, GFR 30-59 ml/min (HCC) 08/12/2016   Lung nodule 10/12/2015   Bronchitis 09/09/2015   History of lung cancer 09/08/2015   Thoracic aorta atherosclerosis ([place]) 09/08/2015   Chronic obstructive pulmonary disease with (acute) exacerbation (HCC) 09/08/2015   Sepsis ([place]) 03/28/2015   CAP (community acquired pneumonia) 03/28/2015   UTI (lower urinary tract infection) 03/28/2015   Respiratory failure ([place]) 03/28/2015   Non-small cell lung cancer ([place])    Malignant neoplasm of upper lobe, bronchus or lung 01/03/2014   Acute hypoxemic respiratory failure ([place]) 10/31/2013   COPD with acute exacerbation  ([place]) 10/30/2013   COPD exacerbation ([place]) 10/30/2013   Other and unspecified noninfectious gastroenteritis and colitis(558.9) 11/20/2012   Rectal bleed 11/19/2012   Environmental allergies 11/02/2012   Obesity 05/05/2012   Renal insufficiency 05/05/2012   Hyperglycemia 05/05/2012   Asthma 12/10/2011   Cigarette smoker 12/10/2011   Hypercholesteremia 12/11/2010   Hypertension 12/11/2010   ALLERGIC RHINITIS, SEASONAL 07/03/2010   COPD 07/03/2010   HIV (human immunodeficiency virus infection) ([place]) 12/09/2006   DEPRESSION 12/09/2006   BREAST MASS, BENIGN 12/09/2006   OSTEOPOROSIS 12/09/2006    Past Surgical History:  Procedure Laterality Date   ABCESS DRAINAGE     abcess under Left arm, abcess from L breast   APPLICATION OF CRANIAL NAVIGATION Right 11/28/2019   Procedure: APPLICATION OF CRANIAL NAVIGATION;  Surgeon: [name], MD;  Location: [facility];  Service: Neurosurgery;  Laterality: Right;   BRAIN SURGERY     BUBBLE STUDY  03/08/2019   Procedure: BUBBLE STUDY;  Surgeon: [name], MD;  Location: [facility];  Service: Cardiovascular;;   CRANIOTOMY Right 11/28/2019   Procedure: Right Craniotomy for Tumor Resection;  Surgeon: [name], MD;  Location: [facility];  Service: Neurosurgery;  Laterality: Right;   CRANIOTOMY     removal of abnormal cells on uterus     TEE WITHOUT CARDIOVERSION N/A 03/08/2019   Procedure: TRANSESOPHAGEAL ECHOCARDIOGRAM (TEE);  Surgeon: [name], MD;  Location: [facility];  Service: Cardiovascular;  Laterality: N/A;   thyroid gland removed       OB History   No obstetric history on file.     Family History  Problem Relation Age of Onset   Pneumonia Mother    Hypertension Sister    Diabetes Sister    Cancer Sister        pancreatic and lung   Cancer Brother        pancreatic   Cancer Brother        pancreatic    Social History   Tobacco Use   Smoking status: Never   Smokeless tobacco: Never   Tobacco comments:     quit date the day she starts her radiation at the [facility]  Substance Use Topics   Alcohol use: Not Currently    Comment: none in 20 years   Drug use: Not Currently    Comment: none in 20 years    Home Medications Prior to Admission medications   Medication Sig Start Date End Date Taking? Authorizing Provider  albuterol (PROVENTIL) (2.5 MG/3ML) 0.083% nebulizer solution Take 2.5 mg by nebulization every 4 (four) hours as needed for wheezing or shortness of breath.   Yes [provider]  albuterol (VENTOLIN HFA) 108 (90 Base) MCG/ACT inhaler Inhale 1 puff into the lungs every 6 (six) hours as needed for shortness of breath.  12/23/19  Yes [provider]  ammonium lactate (  LAC-HYDRIN) 12 % lotion Apply 1 application topically in the morning and at bedtime.   Yes [provider]  apixaban (ELIQUIS) 5 MG TABS tablet Take 1 tablet (5 mg total) by mouth 2 (two) times daily. 03/13/19  Yes Hosie Poisson, MD  Ascorbic Acid (VITAMIN C) 500 MG CAPS Take 1 capsule by mouth daily.   Yes [provider]  B Complex-Biotin-FA (VITAMIN B50 COMPLEX PO) Take 1 tablet by mouth daily.   Yes [provider]  bictegravir-emtricitabine-tenofovir AF (BIKTARVY) 50-200-25 MG TABS tablet Take 1 tablet by mouth daily. 07/12/19  Yes Tommy Medal, Lavell Islam, MD  busPIRone (BUSPAR) 5 MG tablet Take 2.5 mg by mouth 3 (three) times daily.   Yes [provider]  diclofenac Sodium (VOLTAREN) 1 % GEL Apply 4 g topically every 4 (four) hours as needed for pain. 11/09/19  Yes [provider]  Fluticasone-Salmeterol (ADVAIR) 500-50 MCG/DOSE AEPB Inhale 1 puff into the lungs 2 (two) times daily. 09/04/19  Yes [provider]  gabapentin (NEURONTIN) 800 MG tablet Take 800 mg by mouth 3 (three) times daily. 10/26/19  Yes [provider]  hydrOXYzine (ATARAX/VISTARIL) 25 MG tablet Take 25 mg by mouth every 6 (six) hours. 08/03/19  Yes [provider]   ibuprofen (ADVIL) 200 MG tablet Take 800 mg by mouth every 6 (six) hours as needed for moderate pain.   Yes [provider]  Lidocaine 4 % PTCH Apply 1 patch topically daily. Right Shoulder   Yes [provider]  Melatonin 3 MG CAPS Take 1 capsule by mouth at bedtime.   Yes [provider]  montelukast (SINGULAIR) 10 MG tablet Take 10 mg by mouth at bedtime.  09/04/19  Yes [provider]  oxyCODONE (OXY IR/ROXICODONE) 5 MG immediate release tablet Take 5 mg by mouth every 8 (eight) hours as needed for moderate pain. 11/28/20  Yes [provider]  polyethylene glycol (MIRALAX / GLYCOLAX) 17 g packet Take 17 g by mouth 2 (two) times daily.   Yes [provider]  QUEtiapine Fumarate (SEROQUEL XR) 150 MG 24 hr tablet Take 150 mg by mouth at bedtime. 11/09/19  Yes [provider]  rOPINIRole (REQUIP) 1 MG tablet Take 1 tablet (1 mg total) by mouth at bedtime. 05/20/19  Yes Ward, Delice Bison, DO  sennosides-docusate sodium (SENOKOT-S) 8.6-50 MG tablet Take 1 tablet by mouth at bedtime.   Yes [provider]  simethicone (MYLICON) 539 MG chewable tablet Chew 125 mg by mouth every 6 (six) hours as needed for flatulence.   Yes [provider]  Skin Protectants, Misc. (EUCERIN) cream Apply 1 application topically See admin instructions. Apply cream to the entire body after bath every day   Yes [provider]  SPIRIVA RESPIMAT 2.5 MCG/ACT AERS INHALE 2 PUFFS INTO THE LUNGS DAILY Patient taking differently: Inhale 2 puffs into the lungs daily. 09/24/14  Yes Rigoberto Noel, MD  tiZANidine (ZANAFLEX) 4 MG tablet Take 4 mg by mouth every 12 (twelve) hours as needed (pain). 11/08/19  Yes [provider]  traMADol (ULTRAM) 50 MG tablet Take 50 mg by mouth in the morning, at noon, and at bedtime. 12/11/19  Yes [provider]  Venlafaxine HCl 150 MG TB24 Take 1 tablet by mouth daily. 12/22/20  Yes [provider]  doxycycline (VIBRAMYCIN) 100 MG capsule Take 1 capsule (100 mg total) by mouth 2 (two) times daily. One po bid x 7 days Patient not taking: Reported on  01/03/2021 10/07/20   Mesner, Corene Cornea, MD  predniSONE (DELTASONE) 20 MG tablet 2 tabs po daily x 4 days Patient not taking: Reported on 01/03/2021 10/08/20   Mesner, Corene Cornea, MD  sodium chloride (OCEAN) 0.65 % SOLN nasal spray Place 1 spray into both nostrils as needed for congestion. Patient not taking: Reported on 01/03/2021 10/07/20   Mesner, Corene Cornea, MD    Allergies    Patient has no known allergies.  Review of Systems   Review of Systems  Constitutional:  Positive for appetite change, chills, fatigue and fever.  HENT:  Positive for sore throat. Negative for congestion.   Eyes:  Negative for visual disturbance.  Respiratory:  Positive for cough. Negative for shortness of breath.   Cardiovascular:  Negative for chest pain.  Gastrointestinal:  Positive for abdominal pain. Negative for diarrhea, nausea and vomiting.  Musculoskeletal:  Negative for back pain.  Skin:  Negative for rash.  Neurological:  Positive for headaches.   Physical Exam Updated Vital Signs BP 118/63   Pulse 83   Temp (!) 97.5 F (36.4 C) (Oral)   Resp 20   Ht 5\' 9"  (1.753 m)   Wt 99.8 kg   SpO2 93%   BMI 32.49 kg/m   Physical Exam Vitals and nursing note reviewed.  Constitutional:      General: She is not in acute distress.    Appearance: She is well-developed. She is not diaphoretic.  HENT:     Head: Normocephalic and atraumatic.  Eyes:     Conjunctiva/sclera: Conjunctivae normal.  Cardiovascular:     Rate and Rhythm: Normal rate and regular rhythm.     Heart sounds: Normal heart sounds. No murmur heard.   No friction rub. No gallop.  Pulmonary:     Effort: Pulmonary effort is normal. No respiratory distress.     Breath sounds: Normal breath sounds. No wheezing or rales.  Abdominal:     General: There is no distension.     Palpations: Abdomen is soft.      Tenderness: There is no abdominal tenderness. There is no guarding.  Musculoskeletal:        General: No tenderness.     Cervical back: Normal range of motion.  Skin:    General: Skin is warm and dry.     Findings: No erythema or rash.  Neurological:     Mental Status: She is alert and oriented to person, place, and time.     Comments: Oriented to self, location, month. (Reports has been sleeping last several days)    ED Results / Procedures / Treatments   Labs (all labs ordered are listed, but only abnormal results are displayed) Labs Reviewed  COMPREHENSIVE METABOLIC PANEL - Abnormal; Notable for the following components:      Result Value   Glucose, Bld 106 (*)    BUN 36 (*)    Creatinine, Ser 3.26 (*)    Total Protein 8.9 (*)    GFR, Estimated 14 (*)    All other components within normal limits  CBC WITH DIFFERENTIAL/PLATELET - Abnormal; Notable for the following components:   WBC 14.2 (*)    Hemoglobin 10.8 (*)    MCHC 29.9 (*)    RDW 15.8 (*)    Neutro Abs 11.6 (*)    Abs Immature Granulocytes 0.27 (*)    All other components within normal limits  PROTIME-INR - Abnormal; Notable for the following components:   Prothrombin Time 19.1 (*)    INR 1.6 (*)  All other components within normal limits  URINALYSIS, ROUTINE W REFLEX MICROSCOPIC - Abnormal; Notable for the following components:   Color, Urine AMBER (*)    APPearance CLOUDY (*)    Hgb urine dipstick SMALL (*)    Ketones, ur 5 (*)    Protein, ur 100 (*)    Leukocytes,Ua LARGE (*)    WBC, UA >50 (*)    Bacteria, UA RARE (*)    All other components within normal limits  RESP PANEL BY RT-PCR (FLU A&B, COVID) [id number]  GROUP A STREP BY PCR  CULTURE, BLOOD (ROUTINE X 2)  CULTURE, BLOOD (ROUTINE X 2)  URINE CULTURE  RESPIRATORY PANEL BY PCR  LACTIC ACID, PLASMA  APTT  LIPASE, BLOOD  COMPREHENSIVE METABOLIC PANEL  CBC  COMPREHENSIVE METABOLIC PANEL  CBC WITH DIFFERENTIAL/PLATELET  BLOOD GAS, ARTERIAL     EKG EKG Interpretation  Date/Time:  Friday January 03 2021 11:13:47 EDT Ventricular Rate:  91 PR Interval:  175 QRS Duration: 91 QT Interval:  425 QTC Calculation: 523 R Axis:   51 Text Interpretation: Sinus rhythm Prolonged QT interval No significant change since last tracing Confirmed by Gareth Morgan [phone]) on 01/03/2021 1:11:47 PM  Radiology CT ABDOMEN PELVIS WO CONTRAST  Result Date: 01/03/2021 CLINICAL DATA:  Abdominal pain. Decreased oral intake for 3 days EXAM: CT ABDOMEN AND PELVIS WITHOUT CONTRAST TECHNIQUE: Multidetector CT imaging of the abdomen and pelvis was performed following the standard protocol without IV contrast. COMPARISON:  None. FINDINGS: Lower chest: No acute abnormality. Mild bibasilar atelectasis. Hepatobiliary: No focal liver abnormality is seen. No gallstones, gallbladder wall thickening, or biliary dilatation. Pancreas: Unremarkable. No pancreatic ductal dilatation or surrounding inflammatory changes. Spleen: Normal in size without focal abnormality. Adrenals/Urinary Tract: Adrenal glands are unremarkable. Kidneys are normal, without renal calculi, focal lesion, or hydronephrosis. Bladder is unremarkable. Stomach/Bowel: No bowel dilatation to suggest bowel obstruction. No bowel wall thickening or inflammatory changes. Normal decompressed stomach. Normal appendix. Large amount of stool in the rectum which is dilated measuring up to 10 cm. Vascular/Lymphatic: Normal caliber abdominal aorta with mild atherosclerosis. No lymphadenopathy. Reproductive: No adnexal mass or focal abnormality. Other: No abdominal wall hernia or abnormality. No abdominopelvic ascites. Musculoskeletal: No acute osseous abnormality. No aggressive osseous lesion. Severe bilateral facet arthropathy of the lumbar spine. 2 mm retrolisthesis of L5 on S1. Bilateral foraminal stenosis at L5-S1. IMPRESSION: 1. Severe rectal fecal impaction with the rectum dilated up to 10 cm. No bowel obstruction. 2.   Aortic Atherosclerosis (ICD10-I70.0). 3. Lumbar spine spondylosis. Electronically Signed   By: Kathreen Devoid   On: 01/03/2021 12:41   CT Head Wo Contrast  Result Date: 01/03/2021 CLINICAL DATA:  Delirium EXAM: CT HEAD WITHOUT CONTRAST TECHNIQUE: Contiguous axial images were obtained from the base of the skull through the vertex without intravenous contrast. COMPARISON:  MRI head September 05, 2020. 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 While evaluation is somewhat limited across modalities, white matter edema in the high right frontoparietal region may be increased compared to recent MRI from September 05, 2020, appearing to extend farther anteriorly in the frontal lobe. Amorphous hyperdensity at the vertex on the right, likely represents the areas of enhancement seen on prior MRI with limited evaluation on this noncontrast head CT. Similar encephalomalacia in the lateral right temporal lobe, compatible with prior hemorrhagic infarct. No evidence of acute hemorrhage. No midline shift. Similar atrophy with ex vacuo ventricular dilation. No hydrocephalus. No visible extra-axial fluid collections Vascular: No  hyperdense vessel identified. Skull: Prior high right craniotomy.  No acute fracture. Sinuses/Orbits: Visualized sinuses are clear. No acute orbital findings. Other: No mastoid effusions. IMPRESSION: 1. Prior right craniotomy for high right convexity tumor resection. While evaluation is somewhat limited across modalities, white matter edema in the high right frontoparietal region may be increased compared to recent MRI from September 05, 2020, appearing to extend further anteriorly in the frontal lobe. Recommend an MRI with contrast to further characterize and to allow for more direct comparison. 2. Amorphous hyperdensity at the vertex on the right, likely represents the areas of enhancement seen on prior MRI with limited evaluation on this noncontrast head CT. 3. Similar  encephalomalacia in the lateral right temporal lobe, compatible with prior hemorrhagic infarct. Electronically Signed   By: Margaretha Sheffield MD   On: 01/03/2021 13:01   MR Brain W and Wo Contrast  Result Date: 01/03/2021 CLINICAL DATA:  Metastatic lung cancer post resection and radiation EXAM: MRI HEAD WITHOUT AND WITH CONTRAST TECHNIQUE: Multiplanar, multiecho pulse sequences of the brain and surrounding structures were obtained without and with intravenous contrast. CONTRAST:  26mL GADAVIST GADOBUTROL 1 MMOL/ML IV SOLN COMPARISON:  09/05/2020 FINDINGS: Motion artifact is present particularly on postcontrast imaging. Brain: Postoperative changes are again identified in the high right parietal region there is persistent enhancement underlying the craniotomy. This may be slightly increased. Extensive T2 hyperintensity in the underlying frontoparietal lobes has increased in extent, for example anteriorly within the superior frontal gyrus and within the middle frontal gyrus. There is no acute infarction. Chronic blood products are noted in the above postoperative area. 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 No definite new mass or abnormal enhancement remote from above. No significant mass effect. Vascular: Major vessel flow voids at the skull base are preserved. Skull and upper cervical spine: Normal marrow signal is preserved. Sinuses/Orbits: Paranasal sinuses are aerated. Orbits are unremarkable. Other: Sella is unremarkable.  Mastoid air cells are clear. IMPRESSION: Significantly motion degraded. Persistent and possibly slightly increased enhancement underlying the right parietal craniotomy. Extent of right frontoparietal T2 hyperintensity has increased. May be radiation therapy related. Electronically Signed   By: Macy Mis M.D.   On: 01/03/2021 15:41   DG Chest Port 1 View  Result Date: 01/03/2021 CLINICAL DATA:  Generalized weakness with decreased oral intake for 3  days. Hypoxemia. Questionable sepsis. EXAM: PORTABLE CHEST 1 VIEW COMPARISON:  Radiographs 10/07/2020 and 01/02/2020.  CT 01/02/2020. FINDINGS: 0924 hours. The heart size and mediastinal contours are stable. There is stable scarring at both lung bases. The lungs are otherwise clear. No pleural effusion or pneumothorax. Chronic left rib deformities appear unchanged. No acute osseous findings are evident. IMPRESSION: Stable chest.  No acute cardiopulmonary process identified. Electronically Signed   By: Richardean Sale M.D.   On: 01/03/2021 10:07    Procedures Procedures   Medications Ordered in ED Medications  lactated ringers infusion ( Intravenous Rate/Dose Change 01/03/21 2225)  oxyCODONE (Oxy IR/ROXICODONE) immediate release tablet 5 mg (has no administration in time range)  bictegravir-emtricitabine-tenofovir AF (BIKTARVY) 50-200-25 MG per tablet 1 tablet (has no administration in time range)  busPIRone (BUSPAR) tablet 2.5 mg (2.5 mg Oral Given 01/03/21 2246)  QUEtiapine (SEROQUEL XR) 24 hr tablet 150 mg (150 mg Oral Given 01/03/21 2245)  Venlafaxine HCl TB24 150 mg (has no administration in time range)  polyethylene glycol (MIRALAX / GLYCOLAX) packet 17 g (17 g Oral Given 01/03/21 2244)  senna-docusate (Senokot-S) tablet 1 tablet (  1 tablet Oral Given 01/03/21 2242)  simethicone (MYLICON) chewable tablet 80 mg (has no administration in time range)  apixaban (ELIQUIS) tablet 5 mg (5 mg Oral Given 01/03/21 2241)  rOPINIRole (REQUIP) tablet 1 mg (1 mg Oral Given 01/03/21 2246)  mometasone-formoterol (DULERA) 200-5 MCG/ACT inhaler 2 puff (2 puffs Inhalation Not Given 01/03/21 2043)  montelukast (SINGULAIR) tablet 10 mg (10 mg Oral Given 01/03/21 2242)  lidocaine (LIDODERM) 5 % 1 patch (has no administration in time range)  ipratropium-albuterol (DUONEB) 0.5-2.5 (3) MG/3ML nebulizer solution 3 mL (3 mLs Nebulization Given 01/03/21 2012)  guaiFENesin (MUCINEX) 12 hr tablet 600 mg (600 mg Oral Given 01/03/21 2241)   albuterol (PROVENTIL) (2.5 MG/3ML) 0.083% nebulizer solution 2.5 mg (has no administration in time range)  lactated ringers bolus 1,000 mL (0 mLs Intravenous Stopped 01/03/21 1643)  fentaNYL (SUBLIMAZE) injection 100 mcg (100 mcg Intravenous Given 01/03/21 1222)  LORazepam (ATIVAN) tablet 0.5 mg (0.5 mg Oral Given 01/03/21 1400)  dexamethasone (DECADRON) injection 10 mg (10 mg Intravenous Given 01/03/21 1539)  gadobutrol (GADAVIST) 1 MMOL/ML injection 9 mL (9 mLs Intravenous Contrast Given 01/03/21 1502)    ED Course  I have reviewed the triage vital signs and the nursing notes.  Pertinent labs & imaging results that were available during my care of the patient were reviewed by me and considered in my medical decision making (see chart for details).    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    Regarding fever: no sign of intraabdominal source of infection, CXR without pneumonia. Blood cx pending. Urinalysis concerning for possible UTI.  Given abdominal tenderness, CT abdomen shows no acute abnormality, does show constipation with dilated rectum. Disimpaction peformed.   Given headache, CT head ordered showing prior right craniotomy, possible increased edema in comparison to prior. MRI brain WWO performed.   Labs significant for AKI. Admitted for continued monitoring in the setting of AKI, possible sepsis of unclear etiology.    Final Clinical Impression(s) / ED Diagnoses Final diagnoses:  AKI (acute kidney injury) ([place])  Generalized abdominal pain  Constipation, unspecified constipation type  Generalized weakness  Acute nonintractable headache, unspecified headache type  Brain metastases [facility])    Rx / DC Orders ED Discharge  Orders     None        Gareth Morgan, MD 01/03/21 2355

## 2021-01-03 NOTE — ED Notes (Signed)
Provider and RN at bedside to disimpact pt

## 2021-01-03 NOTE — ED Notes (Addendum)
Patient transported to MRI 

## 2021-01-03 NOTE — ED Notes (Signed)
Pt difficult stick, needed [name] IV, limited d/t limb restriction. Only able to obtain 1 set of blood cultures, provider made aware

## 2021-01-03 NOTE — ED Notes (Signed)
Pt requesting pain meds, provider aware

## 2021-01-03 NOTE — ED Notes (Signed)
Pt urinated, purewick on pt, did not need to in and out cath pt. Urine sent to lab

## 2021-01-03 NOTE — ED Notes (Signed)
Pt returned from MRI °

## 2021-01-03 NOTE — ED Notes (Signed)
Patient transported to CT 

## 2021-01-04 ENCOUNTER — Other Ambulatory Visit: Payer: Self-pay

## 2021-01-04 LAB — RESPIRATORY PANEL BY PCR

## 2021-01-04 LAB — BLOOD CULTURE ID PANEL (REFLEXED) - BCID2

## 2021-01-04 LAB — COMPREHENSIVE METABOLIC PANEL
ALT: 21 U/L (ref 0–44)
AST: 24 U/L (ref 15–41)
Albumin: 3.3 g/dL — ABNORMAL LOW (ref 3.5–5.0)
Alkaline Phosphatase: 72 U/L (ref 38–126)
Anion gap: 12 (ref 5–15)
BUN: 30 mg/dL — ABNORMAL HIGH (ref 8–23)
CO2: 26 mmol/L (ref 22–32)
Calcium: 8.8 mg/dL — ABNORMAL LOW (ref 8.9–10.3)
Chloride: 102 mmol/L (ref 98–111)
Creatinine, Ser: 1.7 mg/dL — ABNORMAL HIGH (ref 0.44–1.00)
GFR, Estimated: 31 mL/min — ABNORMAL LOW (ref >60)
Glucose, Bld: 138 mg/dL — ABNORMAL HIGH (ref 70–99)
Potassium: 3.9 mmol/L (ref 3.5–5.1)
Sodium: 140 mmol/L (ref 135–145)
Total Bilirubin: 0.8 mg/dL (ref 0.3–1.2)
Total Protein: 8.2 g/dL — ABNORMAL HIGH (ref 6.5–8.1)

## 2021-01-04 LAB — BLOOD GAS, ARTERIAL
Acid-Base Excess: 1.4 mmol/L (ref 0.0–2.0)
Bicarbonate: 26.7 mmol/L (ref 20.0–28.0)
FIO2: 36
O2 Saturation: 90.3 %
Patient temperature: 97.5
pCO2 arterial: 47.8 mmHg (ref 32.0–48.0)
pH, Arterial: 7.363 (ref 7.350–7.450)
pO2, Arterial: 63 mmHg — ABNORMAL LOW (ref 83.0–108.0)

## 2021-01-04 LAB — CBC WITH DIFFERENTIAL/PLATELET
Abs Immature Granulocytes: 0.07 10*3/uL (ref 0.00–0.07)
Basophils Absolute: 0 10*3/uL (ref 0.0–0.1)
Basophils Relative: 0 %
Eosinophils Absolute: 0 10*3/uL (ref 0.0–0.5)
Eosinophils Relative: 0 %
HCT: 30.8 % — ABNORMAL LOW (ref 36.0–46.0)
Hemoglobin: 9.5 g/dL — ABNORMAL LOW (ref 12.0–15.0)
Immature Granulocytes: 1 %
Lymphocytes Relative: 8 %
Lymphs Abs: 1 10*3/uL (ref 0.7–4.0)
MCH: 27.2 pg (ref 26.0–34.0)
MCHC: 30.8 g/dL (ref 30.0–36.0)
MCV: 88.3 fL (ref 80.0–100.0)
Monocytes Absolute: 0.3 10*3/uL (ref 0.1–1.0)
Monocytes Relative: 2 %
Neutro Abs: 11.4 10*3/uL — ABNORMAL HIGH (ref 1.7–7.7)
Neutrophils Relative %: 89 %
Platelets: 218 10*3/uL (ref 150–400)
RBC: 3.49 MIL/uL — ABNORMAL LOW (ref 3.87–5.11)
RDW: 15.9 % — ABNORMAL HIGH (ref 11.5–15.5)
WBC: 12.7 10*3/uL — ABNORMAL HIGH (ref 4.0–10.5)
nRBC: 0 % (ref 0.0–0.2)

## 2021-01-04 MED ORDER — BISACODYL 10 MG RE SUPP
10.0000 mg | Freq: Once | RECTAL | Status: AC
  Administered 2021-01-04: 10 mg via RECTAL
  Filled 2021-01-04: qty 1

## 2021-01-04 MED ORDER — IPRATROPIUM-ALBUTEROL 0.5-2.5 (3) MG/3ML IN SOLN
3.0000 mL | Freq: Two times a day (BID) | RESPIRATORY_TRACT | Status: DC
  Administered 2021-01-04 – 2021-01-07 (×7): 3 mL via RESPIRATORY_TRACT
  Filled 2021-01-04 (×7): qty 3

## 2021-01-04 MED ORDER — SODIUM CHLORIDE 0.9 % IV SOLN: INTRAVENOUS | Status: AC

## 2021-01-04 NOTE — Progress Notes (Signed)
PHARMACY - PHYSICIAN COMMUNICATION CRITICAL VALUE ALERT - BLOOD CULTURE IDENTIFICATION (BCID)  [name] is an 75 y.o. female who presented to [facility] on 01/03/2021 with a chief complaint of weakness, poor PO intake, fever, hypoxia.  Assessment:  Hx of lung cancer w/ mets to the brain, COPD on chronic O2, HIV.  Admission CXR was clear, CTH/MRI concerning for new tumor.  Denies urinary symptoms, no fevers here, holding off on abx at admission.   Only 1 set of blood cultures obtained.  1/2 bottles growing GPC clusters. BCID: Staph species (no methicillin resistance detected)  Name of physician (or Provider) Contacted: Dr Doristine Bosworth  Current antibiotics: none  Changes to prescribed antibiotics recommended: None, continue to hold antibiotics.   Results for orders placed or performed during the hospital encounter of 01/03/21  Blood Culture ID Panel (Reflexed) (Collected: 01/03/2021 10:28 AM)  Result Value Ref Range   Enterococcus faecalis NOT DETECTED NOT DETECTED   Enterococcus Faecium NOT DETECTED NOT DETECTED   Listeria monocytogenes NOT DETECTED NOT DETECTED   Staphylococcus species DETECTED (A) NOT DETECTED   Staphylococcus aureus (BCID) NOT DETECTED NOT DETECTED   Staphylococcus epidermidis NOT DETECTED NOT DETECTED   Staphylococcus lugdunensis NOT DETECTED NOT DETECTED   Streptococcus species NOT DETECTED NOT DETECTED   Streptococcus agalactiae NOT DETECTED NOT DETECTED   Streptococcus pneumoniae NOT DETECTED NOT DETECTED   Streptococcus pyogenes NOT DETECTED NOT DETECTED   A.calcoaceticus-baumannii NOT DETECTED NOT DETECTED   Bacteroides fragilis NOT DETECTED NOT DETECTED   Enterobacterales NOT DETECTED NOT DETECTED   Enterobacter cloacae complex NOT DETECTED NOT DETECTED   Escherichia coli NOT DETECTED NOT DETECTED   Klebsiella aerogenes NOT DETECTED NOT DETECTED   Klebsiella oxytoca NOT DETECTED NOT DETECTED   Klebsiella pneumoniae NOT DETECTED NOT DETECTED   Proteus  species NOT DETECTED NOT DETECTED   Salmonella species NOT DETECTED NOT DETECTED   Serratia marcescens NOT DETECTED NOT DETECTED   Haemophilus influenzae NOT DETECTED NOT DETECTED   Neisseria meningitidis NOT DETECTED NOT DETECTED   Pseudomonas aeruginosa NOT DETECTED NOT DETECTED   Stenotrophomonas maltophilia NOT DETECTED NOT DETECTED   Candida albicans NOT DETECTED NOT DETECTED   Candida auris NOT DETECTED NOT DETECTED   Candida glabrata NOT DETECTED NOT DETECTED   Candida krusei NOT DETECTED NOT DETECTED   Candida parapsilosis NOT DETECTED NOT DETECTED   Candida tropicalis NOT DETECTED NOT DETECTED   Cryptococcus neoformans/gattii NOT DETECTED NOT DETECTED   Gretta Arab PharmD, BCPS Clinical Pharmacist [facility] main pharmacy [phone] 01/04/2021 8:42 AM

## 2021-01-04 NOTE — Progress Notes (Signed)
PROGRESS NOTE    [name]  LNL:[id number] DOB: September 26, 1945 DOA: 01/03/2021 PCP: Nolene Ebbs, MD   Brief Narrative:  HPI: [name] is a 75 y.o. female with medical history significant of COPD on 2L O2, lung cancer w/ mets to brain, HIV, HLD. Presenting with generalized weakness and fever. She reports that she has not had much of an appetite in the last several days. She denies N/V. She denies chills. She has noted increased headache and fatigue. Her facility reports that she had fever up to 102 over the last several days. She's had O2 sats down into the 80's. She's required an increase of her chronic O2 support. They became concerned and sent her to the ED for eval.   ED Course: CXR was negative. CTH was concerning for new tumor. An MRI was obtained. She was noted to be in AKI. Fluids were started. UA and Ucx are still pending. TRH was called for admission.   Assessment & Plan:   Active Problems:   AKI (acute kidney injury) ([place])  AKI/dehydration: Due to poor p.o. intake.  CT abdomen/pelvis unremarkable.  Significant improvement in creatinine.  We will resume gentle IV hydration.  Abdominal pain/constipation: Fecal disimpaction done in the ED.  She complains of pain all over the body parts, abdomen is one of them, likely chronic.  Continue MiraLAX and senna.  Generalized weakness: Likely chronic due to multiple medical issues.  Consult PT OT.  Fever: Has remained afebrile during hospitalization.  No source identified.  Only 1 bottle of blood culture was drawn yesterday which is positive with a staph aureus.  Will order blood culture x2.  UA, chest x-ray, COVID-19, influenza are all negative.  Respiratory panel pending.  Continue to hold off antibiotics.  Follow fever curve.   COPD/acute on chronic hypoxic respiratory failure: Uses 2 to 3 L of oxygen at home but currently requiring 6 L.  She is very comfortable with no shortness of breath and no wheezes on exam.  Asked RN to  wean her down and maintain her O2 sats between 88 and 92% due to COPD.  We will continue bronchodilators.  She was never started on Solu-Medrol and I do not think that is indicated either.   HIV: continue home regimen/Biktarvy   Hx of PTE: Continue Eliquis.   Hx of lung cancer w/ brain mets, s/p brain tumor resection and radiation Headache     - CTH was concerning for possibility of a new tumor in the right frontoparietal lobe. An MRI was obtained showing an enhancement along that region.  Admitting hospitalist discussed the case with Dr. Lisbeth Renshaw and Dr. Mickeal Skinner.  According to them, her case will be brought before the tumor board. Dr. Mickeal Skinner recommends holding off on steroids for right now.   Goals of Care: Admitting hospitalist spoke with family/HCPOA; they have decided to go with FULL code: Palliative care consulted.  DVT prophylaxis:   Eliquis   Code Status: Full Code  Family Communication:  None present at bedside.  Plan of care discussed with patient in length and he verbalized understanding and agreed with it.  Status is: Inpatient  Remains inpatient appropriate because:Inpatient level of care appropriate due to severity of illness  Dispo: The patient is from: ALF              Anticipated d/c is to: ALF              Patient currently is not medically stable to d/c.  Difficult to place patient No   Estimated body mass index is 32.49 kg/m as calculated from the following:   Height as of this encounter: 5\' 9"  (1.753 m).   Weight as of this encounter: 99.8 kg.     Nutritional status:  Consultants:  None  Procedures:  Plan  Antimicrobials:  Anti-infectives (From admission, onward)    Start     Dose/Rate Route Frequency Ordered Stop   01/04/21 1000  bictegravir-emtricitabine-tenofovir AF (BIKTARVY) 50-200-25 MG per tablet 1 tablet        1 tablet Oral Daily 01/03/21 1830            Subjective: Seen and examined.  She complains of pain all over the body.  No other  specific complaint.  Objective: Vitals:   01/04/21 0323 01/04/21 0650 01/04/21 0818 01/04/21 1136  BP: 132/69 126/68  105/63  Pulse: 80 81  75  Resp: 20   20  Temp: 98 F (36.7 C) 97.6 F (36.4 C)  97.9 F (36.6 C)  TempSrc:  Oral  Oral  SpO2: 97% 95% 96% 93%  Weight:      Height:        Intake/Output Summary (Last 24 hours) at 01/04/2021 1312 Last data filed at 01/04/2021 1137 Gross per 24 hour  Intake 1444.93 ml  Output 950 ml  Net 494.93 ml   Filed Weights   01/03/21 0825  Weight: 99.8 kg    Examination:  General exam: Appears chronically sick but currently stable Respiratory system: Clear to auscultation with some diminished breath sounds but no wheezes or crackles. Respiratory effort normal. Cardiovascular system: S1 & S2 heard, RRR. No JVD, murmurs, rubs, gallops or clicks. No pedal edema. Gastrointestinal system: Abdomen is nondistended, soft and nontender. No organomegaly or masses felt. Normal bowel sounds heard. Central nervous system: Alert and oriented x2.  Left hemiparesis due to previous stroke. Extremities: Symmetric 5 x 5 power. Skin: No rashes, lesions or ulcers  Data Reviewed: I have personally reviewed following labs and imaging studies  CBC: Recent Labs  Lab 01/03/21 1028 01/04/21 0022  WBC 14.2* 12.7*  NEUTROABS 11.6* 11.4*  HGB 10.8* 9.5*  HCT 36.1 30.8*  MCV 88.9 88.3  PLT 237 509   Basic Metabolic Panel: Recent Labs  Lab 01/03/21 1028 01/04/21 0148  NA 139 140  K 3.6 3.9  CL 100 102  CO2 27 26  GLUCOSE 106* 138*  BUN 36* 30*  CREATININE 3.26* 1.70*  CALCIUM 8.9 8.8*   GFR: Estimated Creatinine Clearance: 37 mL/min (A) (by C-G formula based on SCr of 1.7 mg/dL (H)). Liver Function Tests: Recent Labs  Lab 01/03/21 1028 01/04/21 0148  AST 24 24  ALT 21 21  ALKPHOS 68 72  BILITOT 0.8 0.8  PROT 8.9* 8.2*  ALBUMIN 3.7 3.3*   Recent Labs  Lab 01/03/21 1028  LIPASE 21   No results for input(s): AMMONIA in the last 168  hours. Coagulation Profile: Recent Labs  Lab 01/03/21 1028  INR 1.6*   Cardiac Enzymes: No results for input(s): CKTOTAL, CKMB, CKMBINDEX, TROPONINI in the last 168 hours. BNP (last 3 results) No results for input(s): PROBNP in the last 8760 hours. HbA1C: No results for input(s): HGBA1C in the last 72 hours. CBG: No results for input(s): GLUCAP in the last 168 hours. Lipid Profile: No results for input(s): CHOL, HDL, LDLCALC, TRIG, CHOLHDL, LDLDIRECT in the last 72 hours. Thyroid Function Tests: No results for input(s): TSH, T4TOTAL, FREET4, T3FREE, THYROIDAB in the  last 72 hours. Anemia Panel: No results for input(s): VITAMINB12, FOLATE, FERRITIN, TIBC, IRON, RETICCTPCT in the last 72 hours. Sepsis Labs: Recent Labs  Lab 01/03/21 1028  LATICACIDVEN 1.0    Recent Results (from the past 240 hour(s))  Resp Panel by RT-PCR (Flu A&B, Covid) Nasopharyngeal Swab     Status: None   Collection Time: 01/03/21  9:32 AM   Specimen: Nasopharyngeal Swab; Nasopharyngeal(NP) swabs in vial transport medium  Result Value Ref Range Status   SARS Coronavirus 2 by RT PCR NEGATIVE NEGATIVE Final    Comment: (NOTE) SARS-CoV-2 target nucleic acids are NOT DETECTED.  The SARS-CoV-2 RNA is generally detectable in upper respiratory specimens during the acute phase of infection. The lowest concentration of SARS-CoV-2 viral copies this assay can detect is 138 copies/mL. A negative result does not preclude SARS-Cov-2 infection and should not be used as the sole basis for treatment or other patient management decisions. A negative result may occur with  improper specimen collection/handling, submission of specimen other than nasopharyngeal swab, presence of viral mutation(s) within the areas targeted by this assay, and inadequate number of viral copies(<138 copies/mL). A negative result must be combined with clinical observations, patient history, and epidemiological information. The expected  result is Negative.  Fact Sheet for Patients:  EntrepreneurPulse.com.au  Fact Sheet for Healthcare Providers:  IncredibleEmployment.be  This test is no t yet approved or cleared by the [place] FDA and  has been authorized for detection and/or diagnosis of SARS-CoV-2 by FDA under an Emergency Use Authorization (EUA). This EUA will remain  in effect (meaning this test can be used) for the duration of the COVID-19 declaration under Section 564(b)(1) of the Act, 21 U.S.C.section 360bbb-3(b)(1), unless the authorization is terminated  or revoked sooner.       Influenza A by PCR NEGATIVE NEGATIVE Final   Influenza B by PCR NEGATIVE NEGATIVE Final    Comment: (NOTE) The Xpert Xpress SARS-CoV-2/FLU/RSV plus assay is intended as an aid in the diagnosis of influenza from Nasopharyngeal swab specimens and should not be used as a sole basis for treatment. Nasal washings and aspirates are unacceptable for Xpert Xpress SARS-CoV-2/FLU/RSV testing.  Fact Sheet for Patients: EntrepreneurPulse.com.au  Fact Sheet for Healthcare Providers: IncredibleEmployment.be  This test is not yet approved or cleared by the [place] FDA and has been authorized for detection and/or diagnosis of SARS-CoV-2 by FDA under an Emergency Use Authorization (EUA). This EUA will remain in effect (meaning this test can be used) for the duration of the COVID-19 declaration under Section 564(b)(1) of the Act, 21 U.S.C. section 360bbb-3(b)(1), unless the authorization is terminated or revoked.  Performed at [facility], [place] [address] [place], [place] [address]   Group A Strep by PCR     Status: None   Collection Time: 01/03/21  9:33 AM   Specimen: Throat; Sterile Swab  Result Value Ref Range Status   Group A Strep by PCR NOT DETECTED NOT DETECTED Final    Comment: Performed at [facility], [place] [address] [place], [place] [address]  Blood Culture (routine x 2)     Status: None (Preliminary result)   Collection Time: 01/03/21 10:28 AM   Specimen: BLOOD RIGHT ARM  Result Value Ref Range Status   Specimen Description   Final    BLOOD RIGHT ARM Performed at [place] [address] [place],  [address]    Special Requests   Final    BOTTLES  DRAWN AEROBIC AND ANAEROBIC Blood Culture results may not be optimal due to an inadequate volume of blood received in culture bottles Performed at [facility], [place] [address] [place], [place] [address]    Culture  Setup Time   Final    GRAM POSITIVE COCCI IN CLUSTERS AEROBIC BOTTLE ONLY CRITICAL RESULT CALLED TO, READ BACK BY AND VERIFIED WITH: JUSTIN LEGGE PHARMD @0822  01/04/21 EB Performed at [facility], [place] [address], [place], [place] [address]    Culture GRAM POSITIVE COCCI  Final   Report Status PENDING  Incomplete  Blood Culture ID Panel (Reflexed)     Status: Abnormal   Collection Time: 01/03/21 10:28 AM  Result Value Ref Range Status   Enterococcus faecalis NOT DETECTED NOT DETECTED Final   Enterococcus Faecium NOT DETECTED NOT DETECTED Final   Listeria monocytogenes NOT DETECTED NOT DETECTED Final   Staphylococcus species DETECTED (A) NOT DETECTED Final    Comment: CRITICAL RESULT CALLED TO, READ BACK BY AND VERIFIED WITH: JUSTIN LEGGE PHARMD @0822  01/04/21 EB    Staphylococcus aureus (BCID) NOT DETECTED NOT DETECTED Final   Staphylococcus epidermidis NOT DETECTED NOT DETECTED Final   Staphylococcus lugdunensis NOT DETECTED NOT DETECTED Final   Streptococcus species NOT DETECTED NOT DETECTED Final   Streptococcus agalactiae NOT DETECTED NOT DETECTED Final   Streptococcus pneumoniae NOT DETECTED NOT DETECTED Final   Streptococcus pyogenes NOT DETECTED NOT DETECTED Final   A.calcoaceticus-baumannii NOT DETECTED NOT DETECTED Final   Bacteroides fragilis NOT DETECTED NOT  DETECTED Final   Enterobacterales NOT DETECTED NOT DETECTED Final   Enterobacter cloacae complex NOT DETECTED NOT DETECTED Final   Escherichia coli NOT DETECTED NOT DETECTED Final   Klebsiella aerogenes NOT DETECTED NOT DETECTED Final   Klebsiella oxytoca NOT DETECTED NOT DETECTED Final   Klebsiella pneumoniae NOT DETECTED NOT DETECTED Final   Proteus species NOT DETECTED NOT DETECTED Final   Salmonella species NOT DETECTED NOT DETECTED Final   Serratia marcescens NOT DETECTED NOT DETECTED Final   Haemophilus influenzae NOT DETECTED NOT DETECTED Final   Neisseria meningitidis NOT DETECTED NOT DETECTED Final   Pseudomonas aeruginosa NOT DETECTED NOT DETECTED Final   Stenotrophomonas maltophilia NOT DETECTED NOT DETECTED Final   Candida albicans NOT DETECTED NOT DETECTED Final   Candida auris NOT DETECTED NOT DETECTED Final   Candida glabrata NOT DETECTED NOT DETECTED Final   Candida krusei NOT DETECTED NOT DETECTED Final   Candida parapsilosis NOT DETECTED NOT DETECTED Final   Candida tropicalis NOT DETECTED NOT DETECTED Final   Cryptococcus neoformans/gattii NOT DETECTED NOT DETECTED Final    Comment: Performed at [facility], [address] [place], [place] [address]      Radiology Studies: CT ABDOMEN PELVIS WO CONTRAST  Result Date: 01/03/2021 CLINICAL DATA:  Abdominal pain. Decreased oral intake for 3 days EXAM: CT ABDOMEN AND PELVIS WITHOUT CONTRAST TECHNIQUE: Multidetector CT imaging of the abdomen and pelvis was performed following the standard protocol without IV contrast. COMPARISON:  None. FINDINGS: Lower chest: No acute abnormality. Mild bibasilar atelectasis. Hepatobiliary: No focal liver abnormality is seen. No gallstones, gallbladder wall thickening, or biliary dilatation. Pancreas: Unremarkable. No pancreatic ductal dilatation or surrounding inflammatory changes. Spleen: Normal in size without focal abnormality. Adrenals/Urinary Tract: Adrenal glands are unremarkable.  Kidneys are normal, without renal calculi, focal lesion, or hydronephrosis. Bladder is unremarkable. Stomach/Bowel: No bowel dilatation to suggest bowel obstruction. No bowel wall thickening or inflammatory changes. Normal decompressed stomach. Normal appendix. Large amount  of stool in the rectum which is dilated measuring up to 10 cm. Vascular/Lymphatic: Normal caliber abdominal aorta with mild atherosclerosis. No lymphadenopathy. Reproductive: No adnexal mass or focal abnormality. Other: No abdominal wall hernia or abnormality. No abdominopelvic ascites. Musculoskeletal: No acute osseous abnormality. No aggressive osseous lesion. Severe bilateral facet arthropathy of the lumbar spine. 2 mm retrolisthesis of L5 on S1. Bilateral foraminal stenosis at L5-S1. IMPRESSION: 1. Severe rectal fecal impaction with the rectum dilated up to 10 cm. No bowel obstruction. 2.  Aortic Atherosclerosis (ICD10-I70.0). 3. Lumbar spine spondylosis. Electronically Signed   By: Kathreen Devoid   On: 01/03/2021 12:41   CT Head Wo Contrast  Result Date: 01/03/2021 CLINICAL DATA:  [place]ium EXAM: CT HEAD WITHOUT CONTRAST TECHNIQUE: Contiguous axial images were obtained from the base of the skull through the vertex without intravenous contrast. COMPARISON:  MRI head September 05, 2020. 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 While evaluation is somewhat limited across modalities, white matter edema in the high right frontoparietal region may be increased compared to recent MRI from September 05, 2020, appearing to extend farther anteriorly in the frontal lobe. Amorphous hyperdensity at the vertex on the right, likely represents the areas of enhancement seen on prior MRI with limited evaluation on this noncontrast head CT. Similar encephalomalacia in the lateral right temporal lobe, compatible with prior hemorrhagic infarct. No evidence of acute hemorrhage. No midline shift. Similar atrophy with ex vacuo  ventricular dilation. No hydrocephalus. No visible extra-axial fluid collections Vascular: No hyperdense vessel identified. Skull: Prior high right craniotomy.  No acute fracture. Sinuses/Orbits: Visualized sinuses are clear. No acute orbital findings. Other: No mastoid effusions. IMPRESSION: 1. Prior right craniotomy for high right convexity tumor resection. While evaluation is somewhat limited across modalities, white matter edema in the high right frontoparietal region may be increased compared to recent MRI from September 05, 2020, appearing to extend further anteriorly in the frontal lobe. Recommend an MRI with contrast to further characterize and to allow for more direct comparison. 2. Amorphous hyperdensity at the vertex on the right, likely represents the areas of enhancement seen on prior MRI with limited evaluation on this noncontrast head CT. 3. Similar encephalomalacia in the lateral right temporal lobe, compatible with prior hemorrhagic infarct. Electronically Signed   By: Margaretha Sheffield MD   On: 01/03/2021 13:01   MR Brain W and Wo Contrast  Result Date: 01/03/2021 CLINICAL DATA:  Metastatic lung cancer post resection and radiation EXAM: MRI HEAD WITHOUT AND WITH CONTRAST TECHNIQUE: Multiplanar, multiecho pulse sequences of the brain and surrounding structures were obtained without and with intravenous contrast. CONTRAST:  43mL GADAVIST GADOBUTROL 1 MMOL/ML IV SOLN COMPARISON:  09/05/2020 FINDINGS: Motion artifact is present particularly on postcontrast imaging. Brain: Postoperative changes are again identified in the high right parietal region there is persistent enhancement underlying the craniotomy. This may be slightly increased. Extensive T2 hyperintensity in the underlying frontoparietal lobes has increased in extent, for example anteriorly within the superior frontal gyrus and within the middle frontal gyrus. There is no acute infarction. Chronic blood products are noted in the above  postoperative area. 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 No definite new mass or abnormal enhancement remote from above. No significant mass effect. Vascular: Major vessel flow voids at the skull base are preserved. Skull and upper cervical spine: Normal marrow signal is preserved. Sinuses/Orbits: Paranasal sinuses are aerated. Orbits are unremarkable. Other: Sella is unremarkable.  Mastoid air  cells are clear. IMPRESSION: Significantly motion degraded. Persistent and possibly slightly increased enhancement underlying the right parietal craniotomy. Extent of right frontoparietal T2 hyperintensity has increased. May be radiation therapy related. Electronically Signed   By: Macy Mis M.D.   On: 01/03/2021 15:41   DG Chest Port 1 View  Result Date: 01/03/2021 CLINICAL DATA:  Generalized weakness with decreased oral intake for 3 days. Hypoxemia. Questionable sepsis. EXAM: PORTABLE CHEST 1 VIEW COMPARISON:  Radiographs 10/07/2020 and 01/02/2020.  CT 01/02/2020. FINDINGS: 0924 hours. The heart size and mediastinal contours are stable. There is stable scarring at both lung bases. The lungs are otherwise clear. No pleural effusion or pneumothorax. Chronic left rib deformities appear unchanged. No acute osseous findings are evident. IMPRESSION: Stable chest.  No acute cardiopulmonary process identified. Electronically Signed   By: Richardean Sale M.D.   On: 01/03/2021 10:07    Scheduled Meds:  apixaban  5 mg Oral BID   bictegravir-emtricitabine-tenofovir AF  1 tablet Oral Daily   busPIRone  2.5 mg Oral TID   guaiFENesin  600 mg Oral BID   ipratropium-albuterol  3 mL Nebulization BID   lidocaine  1 patch Transdermal Daily   mometasone-formoterol  2 puff Inhalation BID   montelukast  10 mg Oral QHS   polyethylene glycol  17 g Oral BID   QUEtiapine Fumarate  150 mg Oral QHS   rOPINIRole  1 mg Oral QHS   senna-docusate  1 tablet Oral QHS   venlafaxine XR  150 mg Oral  Daily   Continuous Infusions:   LOS: 1 day   Time spent: 37 minutes   Darliss Cheney, MD [facility]  01/04/2021, 1:12 PM   How to contact the [facility] Attending or Consulting provider [place] or covering provider during after hours [place], for this patient?  Check the care team in [facility] and look for a) attending/consulting [facility] provider listed and b) the [facility] team listed. Page or secure chat [phone]. Log into [email or web address] and use [place]'s universal password to access. If you do not have the password, please contact the hospital operator. Locate the [facility] provider you are looking for under [facility] and page to a number that you can be directly reached. If you still have difficulty reaching the provider, please page the [facility] (Director on Call) for the Hospitalists listed on amion for assistance.

## 2021-01-05 LAB — BASIC METABOLIC PANEL
Anion gap: 8 (ref 5–15)
BUN: 25 mg/dL — ABNORMAL HIGH (ref 8–23)
CO2: 28 mmol/L (ref 22–32)
Calcium: 8.6 mg/dL — ABNORMAL LOW (ref 8.9–10.3)
Chloride: 104 mmol/L (ref 98–111)
Creatinine, Ser: 1 mg/dL (ref 0.44–1.00)
GFR, Estimated: 59 mL/min — ABNORMAL LOW (ref >60)
Glucose, Bld: 97 mg/dL (ref 70–99)
Potassium: 4.5 mmol/L (ref 3.5–5.1)
Sodium: 140 mmol/L (ref 135–145)

## 2021-01-05 LAB — CBC WITH DIFFERENTIAL/PLATELET
Abs Immature Granulocytes: 0.16 10*3/uL — ABNORMAL HIGH (ref 0.00–0.07)
Basophils Absolute: 0 10*3/uL (ref 0.0–0.1)
Basophils Relative: 0 %
Eosinophils Absolute: 0.1 10*3/uL (ref 0.0–0.5)
Eosinophils Relative: 1 %
HCT: 29.4 % — ABNORMAL LOW (ref 36.0–46.0)
Hemoglobin: 9.3 g/dL — ABNORMAL LOW (ref 12.0–15.0)
Immature Granulocytes: 1 %
Lymphocytes Relative: 13 %
Lymphs Abs: 1.6 10*3/uL (ref 0.7–4.0)
MCH: 27.7 pg (ref 26.0–34.0)
MCHC: 31.6 g/dL (ref 30.0–36.0)
MCV: 87.5 fL (ref 80.0–100.0)
Monocytes Absolute: 0.7 10*3/uL (ref 0.1–1.0)
Monocytes Relative: 6 %
Neutro Abs: 10.2 10*3/uL — ABNORMAL HIGH (ref 1.7–7.7)
Neutrophils Relative %: 79 %
Platelets: 202 10*3/uL (ref 150–400)
RBC: 3.36 MIL/uL — ABNORMAL LOW (ref 3.87–5.11)
RDW: 15.9 % — ABNORMAL HIGH (ref 11.5–15.5)
WBC: 12.8 10*3/uL — ABNORMAL HIGH (ref 4.0–10.5)
nRBC: 0.2 % (ref 0.0–0.2)

## 2021-01-05 MED ORDER — SODIUM CHLORIDE 0.9 % IV SOLN: INTRAVENOUS | Status: AC

## 2021-01-05 MED ORDER — VANCOMYCIN HCL 1250 MG/250ML IV SOLN
1250.0000 mg | INTRAVENOUS | Status: DC
  Administered 2021-01-06 – 2021-01-07 (×2): 1250 mg via INTRAVENOUS
  Filled 2021-01-05 (×2): qty 250

## 2021-01-05 MED ORDER — VANCOMYCIN HCL 1750 MG/350ML IV SOLN
1750.0000 mg | Freq: Once | INTRAVENOUS | Status: AC
  Administered 2021-01-05: 1750 mg via INTRAVENOUS
  Filled 2021-01-05: qty 350

## 2021-01-05 MED ORDER — SODIUM CHLORIDE 0.9 % IV SOLN
2.0000 g | Freq: Three times a day (TID) | INTRAVENOUS | Status: DC
  Administered 2021-01-05 – 2021-01-07 (×7): 2 g via INTRAVENOUS
  Filled 2021-01-05 (×7): qty 2

## 2021-01-05 MED ORDER — ACETAMINOPHEN 325 MG PO TABS
650.0000 mg | ORAL_TABLET | Freq: Four times a day (QID) | ORAL | Status: DC | PRN
  Administered 2021-01-05 – 2021-01-08 (×3): 650 mg via ORAL
  Filled 2021-01-05 (×3): qty 2

## 2021-01-05 NOTE — Evaluation (Signed)
Occupational Therapy Evaluation Patient Details Name: [name] MRN: [id number] DOB: 1945-12-13 Today's Date: 01/05/2021    History of Present Illness [name] is a 75 y.o. female with medical history significant of COPD on 2L O2, lung cancer w/ mets to brain, HIV, HLD. Presented with generalized weakness and fever. CTH was concerning for new tumor. MRI was obtained showing an enhancement along right frontoparietal lobe.. Pt also noted to be in AKI.   Clinical Impression   Pt is a poor historian and unable to provide prior level functioning information. She currently demonstrates cognitive limitations impacting her independence with self-care. Pt requires minimal assistance for self-feeding and moderate assistance for grooming at bed level. Pt took 2 bites of scrambled eggs during session and then reported she was not hungry. Per previous admission in 2021, pt required moderate assistance +2 for bed mobility and +3 assistance for use of the sara stedy. OT will continue to follow pt acutely to maximize pt's safety and independence with ADL/IADL and mobility. Recommend SNF for follow-up therapy services.      Follow Up Recommendations  SNF;Supervision/Assistance - 24 hour    Equipment Recommendations  Other (comment) (per accepting facility)    Recommendations for Other Services       Precautions / Restrictions Precautions Precautions: Fall Precaution Comments: left hemiplegia Restrictions Weight Bearing Restrictions: No      Mobility Bed Mobility               General bed mobility comments: not assessed this date    Transfers Overall transfer level: Needs assistance               General transfer comment: pt will require lift equipment    Balance                                           ADL either performed or assessed with clinical judgement   ADL Overall ADL's : Needs assistance/impaired Eating/Feeding: Minimal  assistance;Sitting Eating/Feeding Details (indicate cue type and reason): cues to initiate, pt able to self feed with increased time and effort. Pt took 2 bites of scrambled eggs, declined more stated she was "full" and requested more juice Grooming: Moderate assistance;Cueing for sequencing   Upper Body Bathing: Moderate assistance;Bed level   Lower Body Bathing: Total assistance   Upper Body Dressing : Maximal assistance   Lower Body Dressing: Total assistance   Toilet Transfer: Total assistance Toilet Transfer Details (indicate cue type and reason): per clinical judgement Toileting- Clothing Manipulation and Hygiene: Total assistance Toileting - Clothing Manipulation Details (indicate cue type and reason): per clinical judgement       General ADL Comments: pt limited by decreased functional use of LUE, L side hemiplegia, and cognition     Vision   Additional Comments: decreased visual attention to right side of environment;unable to formally assess vision secondary to cognitive limitations     Perception     Praxis      Pertinent Vitals/Pain Pain Assessment: Faces Faces Pain Scale: Hurts even more Pain Location: LUE with movement Pain Descriptors / Indicators: Grimacing;Guarding Pain Intervention(s): Monitored during session;Limited activity within patient's tolerance;Repositioned     Hand Dominance Right   Extremity/Trunk Assessment Upper Extremity Assessment Upper Extremity Assessment: Generalized weakness;LUE deficits/detail LUE Deficits / Details: hemiplegic;elbow flexion contraction hand fisted,minimal wrist ROM, able to supinate and pronate forearm. unable to  assess shoulder secondary to pain. LUE Coordination: decreased fine motor;decreased gross motor   Lower Extremity Assessment Lower Extremity Assessment: LLE deficits/detail;Defer to PT evaluation LLE Deficits / Details: hemiplegic, LLE contracted with hip in ER, knee if flexion       Communication  Communication Communication: Receptive difficulties;Expressive difficulties   Cognition Arousal/Alertness: Awake/alert Behavior During Therapy: Flat affect Overall Cognitive Status: No family/caregiver present to determine baseline cognitive functioning                                 General Comments: pt appearing very "foggy" today, talking nonscensically at times. Pt oriented to place and self. required mutlimodal cues for sequencing tasks and to self feed x2 bites fo scrambled eggs. Pt stated "Im not hungry, I just want juice" RN present in the room.   General Comments  pt with temp of 100degrees F    Exercises     Shoulder Instructions      Home Living Family/patient expects to be discharged to:: Skilled nursing facility                                 Additional Comments: Per chart, pt was previously at either ALF or SNF      Prior Functioning/Environment Level of Independence: Needs assistance        Comments: Pt unable to provide information, information gathered per chart from previous admission 12/2019 during this time she required minA-modA with rolling in bed, maxA+2 for sidelying to sit;pt was needing assistance with bathing and dressing and using a wc for mobility        OT Problem List: Decreased strength;Decreased range of motion;Decreased activity tolerance;Impaired balance (sitting and/or standing);Decreased safety awareness;Decreased cognition;Decreased knowledge of use of DME or AE;Impaired UE functional use      OT Treatment/Interventions: Self-care/ADL training;Therapeutic exercise;DME and/or AE instruction;Therapeutic activities;Patient/family education;Balance training;Cognitive remediation/compensation    OT Goals(Current goals can be found in the care plan section) Acute Rehab OT Goals Patient Stated Goal: none stated OT Goal Formulation: Patient unable to participate in goal setting Time For Goal Achievement:  01/19/21 Potential to Achieve Goals: Fair ADL Goals Pt Will Perform Eating: with set-up;sitting Pt Will Perform Grooming: with set-up;sitting  OT Frequency: Min 2X/week   Barriers to D/C:            Co-evaluation              AM-PAC OT "6 Clicks" Daily Activity     Outcome Measure Help from another person eating meals?: A Little Help from another person taking care of personal grooming?: A Little Help from another person toileting, which includes using toliet, bedpan, or urinal?: Total Help from another person bathing (including washing, rinsing, drying)?: A Lot Help from another person to put on and taking off regular upper body clothing?: A Lot Help from another person to put on and taking off regular lower body clothing?: Total 6 Click Score: 12   End of Session Nurse Communication: Mobility status  Activity Tolerance: Patient tolerated treatment well Patient left: in bed;with call bell/phone within reach;with bed alarm set;with nursing/sitter in room  OT Visit Diagnosis: Other abnormalities of gait and mobility (R26.89);Muscle weakness (generalized) (M62.81);Other symptoms and signs involving cognitive function;Hemiplegia and hemiparesis;Unsteadiness on feet (R26.81) Hemiplegia - Right/Left: Left Hemiplegia - dominant/non-dominant: Non-Dominant Hemiplegia - caused by: Cerebral infarction  Time: 1020-1043 OT Time Calculation (min): 23 min Charges:  OT General Charges $OT Visit: 1 Visit OT Evaluation $OT Eval Moderate Complexity: 1 Mod OT Treatments $Self Care/Home Management : 8-22 mins  [name] OTR/L Acute Rehabilitation Services Office: [phone]   [name] 01/05/2021, 12:06 PM

## 2021-01-05 NOTE — Progress Notes (Signed)
PROGRESS NOTE    [name]  NWG:[id number] DOB: [birth year] DOA: 01/03/2021 PCP: Nolene Ebbs, MD   Brief Narrative:  [name] is a 75 y.o. female with medical history significant of COPD on 2L O2, lung cancer w/ mets to brain, HIV, HLD. Presented with generalized weakness and fever. She reports that she has not had much of an appetite in the last several days. She denies N/V. She has noted increased headache and fatigue. Her facility reports that she had fever up to 102 over the last several days. She's had O2 sats down into the 80's. She's required an increase of her chronic O2 support. They became concerned and sent her to the ED for eval.   ED Course: CXR was negative. CTH was concerning for new tumor. An MRI was obtained. She was noted to be in AKI. Fluids were started.   Assessment & Plan:   Active Problems:   AKI (acute kidney injury) ([place])  AKI/dehydration: Due to poor p.o. intake.  CT abdomen/pelvis unremarkable.  AKI resolved.  No more IV fluids needed.  She is more alert and hope that she will have fair p.o. intake.  Abdominal pain/constipation: Fecal disimpaction done in the ED.  Continue MiraLAX and senna.  No more pain today.  Generalized weakness: Likely chronic due to multiple medical issues.  PT OT consulted  Fever/bacteremia: Developed fever of 101 this morning.  Continue to have leukocytosis but that is a stable.  Still no source identified.  Only 1 bottle of blood culture was drawn yesterday which is positive with a staph aureus.  Repeat blood culture from 01/04/2021 are negative thus far.  We will now start on IV antibiotics empirically with cefepime and vancomycin and follow culture and further investigate for source of fever.   COPD/acute on chronic hypoxic respiratory failure: Uses 2 to 3 L of oxygen at home but was requiring 6 L yesterday.  She is down to 2 L now.  Lungs clear to auscultation.  She is feeling better without shortness of breath.  We will  continue bronchodilators and home medications.   HIV: continue home regimen/Biktarvy   Hx of PTE: Continue Eliquis.   Hx of lung cancer w/ brain mets, s/p brain tumor resection and radiation Headache     - CTH was concerning for possibility of a new tumor in the right frontoparietal lobe. An MRI was obtained showing an enhancement along that region.  Admitting hospitalist discussed the case with Dr. Lisbeth Renshaw and Dr. Mickeal Skinner.  According to them, her case will be brought before the tumor board. Dr. Mickeal Skinner recommends holding off on steroids for right now.   Goals of Care: Admitting hospitalist spoke with family/HCPOA; they have decided to go with FULL code: Palliative care consulted at admission but has not seen patient yet.  DVT prophylaxis:   Eliquis   Code Status: Full Code  Family Communication:  None present at bedside.  Plan of care discussed with patient in length and he verbalized understanding and agreed with it.  Status is: Inpatient  Remains inpatient appropriate because:Inpatient level of care appropriate due to severity of illness  Dispo: The patient is from: ALF              Anticipated d/c is to: ALF              Patient currently is not medically stable to d/c.   Difficult to place patient No   Estimated body mass index is 32.49 kg/m as  calculated from the following:   Height as of this encounter: 5\' 9"  (1.753 m).   Weight as of this encounter: 99.8 kg.     Nutritional status:  Consultants:  None  Procedures:  Plan  Antimicrobials:  Anti-infectives (From admission, onward)    Start     Dose/Rate Route Frequency Ordered Stop   01/06/21 1000  vancomycin (VANCOREADY) IVPB 1250 mg/250 mL        1,250 mg 166.7 mL/hr over 90 Minutes Intravenous Every 24 hours 01/05/21 0923     01/05/21 1000  vancomycin (VANCOREADY) IVPB 1750 mg/350 mL        1,750 mg 175 mL/hr over 120 Minutes Intravenous  Once 01/05/21 0912     01/05/21 1000  ceFEPIme (MAXIPIME) 2 g in sodium  chloride 0.9 % 100 mL IVPB        2 g 200 mL/hr over 30 Minutes Intravenous Every 8 hours 01/05/21 0913     01/04/21 1000  bictegravir-emtricitabine-tenofovir AF (BIKTARVY) 50-200-25 MG per tablet 1 tablet        1 tablet Oral Daily 01/03/21 1830            Subjective: Seen and examined.  Much more alert and doing much better than yesterday with no complaints today.  Objective: Vitals:   01/05/21 0459 01/05/21 0801 01/05/21 0820 01/05/21 1000  BP: (!) 151/79  (!) 150/74 (!) 155/65  Pulse: 98  88 89  Resp: 20  20 20   Temp: 100.2 F (37.9 C)  (!) 101 F (38.3 C) 100 F (37.8 C)  TempSrc: Oral  Oral Oral  SpO2: 95% 91% 92%   Weight:      Height:        Intake/Output Summary (Last 24 hours) at 01/05/2021 1115 Last data filed at 01/05/2021 8416 Gross per 24 hour  Intake 1361.1 ml  Output 1100 ml  Net 261.1 ml    Filed Weights   01/03/21 0825  Weight: 99.8 kg    Examination:  General exam: Appears calm and comfortable  Respiratory system: Clear to auscultation. Respiratory effort normal. Cardiovascular system: S1 & S2 heard, RRR. No JVD, murmurs, rubs, gallops or clicks. No pedal edema. Gastrointestinal system: Abdomen is nondistended, soft and nontender. No organomegaly or masses felt. Normal bowel sounds heard. Central nervous system: Alert and oriented.  Left hemiparesis Extremities: Symmetric 5 x 5 power. Skin: No rashes, lesions or ulcers.  Psychiatry: Judgement and insight appear normal. Mood & affect appropriate.    Data Reviewed: I have personally reviewed following labs and imaging studies  CBC: Recent Labs  Lab 01/03/21 1028 01/04/21 0022 01/05/21 0339  WBC 14.2* 12.7* 12.8*  NEUTROABS 11.6* 11.4* 10.2*  HGB 10.8* 9.5* 9.3*  HCT 36.1 30.8* 29.4*  MCV 88.9 88.3 87.5  PLT 237 218 606    Basic Metabolic Panel: Recent Labs  Lab 01/03/21 1028 01/04/21 0148 01/05/21 0339  NA 139 140 140  K 3.6 3.9 4.5  CL 100 102 104  CO2 27 26 28   GLUCOSE  106* 138* 97  BUN 36* 30* 25*  CREATININE 3.26* 1.70* 1.00  CALCIUM 8.9 8.8* 8.6*    GFR: Estimated Creatinine Clearance: 63 mL/min (by C-G formula based on SCr of 1 mg/dL). Liver Function Tests: Recent Labs  Lab 01/03/21 1028 01/04/21 0148  AST 24 24  ALT 21 21  ALKPHOS 68 72  BILITOT 0.8 0.8  PROT 8.9* 8.2*  ALBUMIN 3.7 3.3*    Recent Labs  Lab 01/03/21 1028  LIPASE 21    No results for input(s): AMMONIA in the last 168 hours. Coagulation Profile: Recent Labs  Lab 01/03/21 1028  INR 1.6*    Cardiac Enzymes: No results for input(s): CKTOTAL, CKMB, CKMBINDEX, TROPONINI in the last 168 hours. BNP (last 3 results) No results for input(s): PROBNP in the last 8760 hours. HbA1C: No results for input(s): HGBA1C in the last 72 hours. CBG: No results for input(s): GLUCAP in the last 168 hours. Lipid Profile: No results for input(s): CHOL, HDL, LDLCALC, TRIG, CHOLHDL, LDLDIRECT in the last 72 hours. Thyroid Function Tests: No results for input(s): TSH, T4TOTAL, FREET4, T3FREE, THYROIDAB in the last 72 hours. Anemia Panel: No results for input(s): VITAMINB12, FOLATE, FERRITIN, TIBC, IRON, RETICCTPCT in the last 72 hours. Sepsis Labs: Recent Labs  Lab 01/03/21 1028  LATICACIDVEN 1.0     Recent Results (from the past 240 hour(s))  Resp Panel by RT-PCR (Flu A&B, Covid) Nasopharyngeal Swab     Status: None   Collection Time: 01/03/21  9:32 AM   Specimen: Nasopharyngeal Swab; Nasopharyngeal(NP) swabs in vial transport medium  Result Value Ref Range Status   SARS Coronavirus 2 by RT PCR NEGATIVE NEGATIVE Final    Comment: (NOTE) SARS-CoV-2 target nucleic acids are NOT DETECTED.  The SARS-CoV-2 RNA is generally detectable in upper respiratory specimens during the acute phase of infection. The lowest concentration of SARS-CoV-2 viral copies this assay can detect is 138 copies/mL. A negative result does not preclude SARS-Cov-2 infection and should not be used as the  sole basis for treatment or other patient management decisions. A negative result may occur with  improper specimen collection/handling, submission of specimen other than nasopharyngeal swab, presence of viral mutation(s) within the areas targeted by this assay, and inadequate number of viral copies(<138 copies/mL). A negative result must be combined with clinical observations, patient history, and epidemiological information. The expected result is Negative.  Fact Sheet for Patients:  EntrepreneurPulse.com.au  Fact Sheet for Healthcare Providers:  IncredibleEmployment.be  This test is no t yet approved or cleared by the [place] FDA and  has been authorized for detection and/or diagnosis of SARS-CoV-2 by FDA under an Emergency Use Authorization (EUA). This EUA will remain  in effect (meaning this test can be used) for the duration of the COVID-19 declaration under Section 564(b)(1) of the Act, 21 U.S.C.section 360bbb-3(b)(1), unless the authorization is terminated  or revoked sooner.       Influenza A by PCR NEGATIVE NEGATIVE Final   Influenza B by PCR NEGATIVE NEGATIVE Final    Comment: (NOTE) The Xpert Xpress SARS-CoV-2/FLU/RSV plus assay is intended as an aid in the diagnosis of influenza from Nasopharyngeal swab specimens and should not be used as a sole basis for treatment. Nasal washings and aspirates are unacceptable for Xpert Xpress SARS-CoV-2/FLU/RSV testing.  Fact Sheet for Patients: EntrepreneurPulse.com.au  Fact Sheet for Healthcare Providers: IncredibleEmployment.be  This test is not yet approved or cleared by the [place] FDA and has been authorized for detection and/or diagnosis of SARS-CoV-2 by FDA under an Emergency Use Authorization (EUA). This EUA will remain in effect (meaning this test can be used) for the duration of the COVID-19 declaration under Section 564(b)(1) of the  Act, 21 U.S.C. section 360bbb-3(b)(1), unless the authorization is terminated or revoked.  Performed at [facility], [place] [address] [place], [place] [address]   Group A Strep by PCR     Status: None   Collection Time: 01/03/21  9:33 AM  Specimen: Throat; Sterile Swab  Result Value Ref Range Status   Group A Strep by PCR NOT DETECTED NOT DETECTED Final    Comment: Performed at [facility], [place] [address], [place], [place] [address]  Blood Culture (routine x 2)     Status: None (Preliminary result)   Collection Time: 01/03/21 10:28 AM   Specimen: BLOOD RIGHT ARM  Result Value Ref Range Status   Specimen Description   Final    BLOOD RIGHT ARM Performed at [place] [address] [place], [place] [address]    Special Requests   Final    BOTTLES DRAWN AEROBIC AND ANAEROBIC Blood Culture results may not be optimal due to an inadequate volume of blood received in culture bottles Performed at [place] [address] [place], [place] [address]    Culture  Setup Time   Final    GRAM POSITIVE COCCI IN CLUSTERS AEROBIC BOTTLE ONLY CRITICAL RESULT CALLED TO, READ BACK BY AND VERIFIED WITH: JUSTIN LEGGE PHARMD @0822  01/04/21 EB    Culture   Final    GRAM POSITIVE COCCI IDENTIFICATION TO FOLLOW Performed at [facility], [place] [address], [place], [place] [address]    Report Status PENDING  Incomplete  Blood Culture ID Panel (Reflexed)     Status: Abnormal   Collection Time: 01/03/21 10:28 AM  Result Value Ref Range Status   Enterococcus faecalis NOT DETECTED NOT DETECTED Final   Enterococcus Faecium NOT DETECTED NOT DETECTED Final   Listeria monocytogenes NOT DETECTED NOT DETECTED Final   Staphylococcus species DETECTED (A) NOT DETECTED Final    Comment: CRITICAL RESULT CALLED TO, READ BACK BY AND VERIFIED WITH: JUSTIN LEGGE PHARMD @0822  01/04/21 EB    Staphylococcus aureus (BCID) NOT DETECTED  NOT DETECTED Final   Staphylococcus epidermidis NOT DETECTED NOT DETECTED Final   Staphylococcus lugdunensis NOT DETECTED NOT DETECTED Final   Streptococcus species NOT DETECTED NOT DETECTED Final   Streptococcus agalactiae NOT DETECTED NOT DETECTED Final   Streptococcus pneumoniae NOT DETECTED NOT DETECTED Final   Streptococcus pyogenes NOT DETECTED NOT DETECTED Final   A.calcoaceticus-baumannii NOT DETECTED NOT DETECTED Final   Bacteroides fragilis NOT DETECTED NOT DETECTED Final   Enterobacterales NOT DETECTED NOT DETECTED Final   Enterobacter cloacae complex NOT DETECTED NOT DETECTED Final   Escherichia coli NOT DETECTED NOT DETECTED Final   Klebsiella aerogenes NOT DETECTED NOT DETECTED Final   Klebsiella oxytoca NOT DETECTED NOT DETECTED Final   Klebsiella pneumoniae NOT DETECTED NOT DETECTED Final   Proteus species NOT DETECTED NOT DETECTED Final   Salmonella species NOT DETECTED NOT DETECTED Final   Serratia marcescens NOT DETECTED NOT DETECTED Final   Haemophilus influenzae NOT DETECTED NOT DETECTED Final   Neisseria meningitidis NOT DETECTED NOT DETECTED Final   Pseudomonas aeruginosa NOT DETECTED NOT DETECTED Final   Stenotrophomonas maltophilia NOT DETECTED NOT DETECTED Final   Candida albicans NOT DETECTED NOT DETECTED Final   Candida auris NOT DETECTED NOT DETECTED Final   Candida glabrata NOT DETECTED NOT DETECTED Final   Candida krusei NOT DETECTED NOT DETECTED Final   Candida parapsilosis NOT DETECTED NOT DETECTED Final   Candida tropicalis NOT DETECTED NOT DETECTED Final   Cryptococcus neoformans/gattii NOT DETECTED NOT DETECTED Final    Comment: Performed at [facility], [address] [place], [place] [address]  Culture, Urine     Status: Abnormal (Preliminary result)   Collection Time: 01/03/21  4:43 PM  Specimen: Urine, Catheterized  Result Value Ref Range Status   Specimen Description   Final    URINE, CATHETERIZED Performed at [place] [address], [place], [place] [address]    Special Requests   Final    NONE Performed at [facility], [place] [address], [place], [place] [address]    Culture (A)  Final    70,000 COLONIES/mL Lonell Grandchild NEGATIVE RODS SUSCEPTIBILITIES TO FOLLOW Performed at [facility], [place] [address], [place], [place] [address]    Report Status PENDING  Incomplete  Culture, blood (routine x 2)     Status: None (Preliminary result)   Collection Time: 01/04/21  8:56 AM   Specimen: BLOOD RIGHT HAND  Result Value Ref Range Status   Specimen Description   Final    BLOOD RIGHT HAND Performed at [place] [address], [place], [place] [address]    Special Requests   Final    BOTTLES DRAWN AEROBIC ONLY Blood Culture adequate volume Performed at [place] [address] [place], [place] [address]    Culture   Final    NO GROWTH < 24 HOURS Performed at [place] [address] [place], [place] [address]    Report Status PENDING  Incomplete  Culture, blood (routine x 2)     Status: None (Preliminary result)   Collection Time: 01/04/21  9:29 AM   Specimen: BLOOD RIGHT HAND  Result Value Ref Range Status   Specimen Description   Final    BLOOD RIGHT HAND Performed at [place] [address], [place], [place] [address]    Special Requests   Final    BOTTLES DRAWN AEROBIC ONLY Blood Culture results may not be optimal due to an inadequate volume of blood received in culture bottles Performed at [place] [address], [place], [place] [address]    Culture   Final    NO GROWTH < 24 HOURS Performed at [place] [address], [place], [place] [address]    Report Status PENDING  Incomplete  Respiratory (~20 pathogens) panel by PCR     Status: None   Collection Time: 01/04/21 10:03 AM   Specimen: Nasopharyngeal Swab; Respiratory  Result Value Ref Range  Status   Adenovirus NOT DETECTED NOT DETECTED Final   Coronavirus 229E NOT DETECTED NOT DETECTED Final    Comment: (NOTE) The Coronavirus on the Respiratory Panel, DOES NOT test for the novel  Coronavirus (2019 nCoV)    Coronavirus HKU1 NOT DETECTED NOT DETECTED Final   Coronavirus NL63 NOT DETECTED NOT DETECTED Final   Coronavirus OC43 NOT DETECTED NOT DETECTED Final   Metapneumovirus NOT DETECTED NOT DETECTED Final   Rhinovirus / Enterovirus NOT DETECTED NOT DETECTED Final   Influenza A NOT DETECTED NOT DETECTED Final   Influenza B NOT DETECTED NOT DETECTED Final   Parainfluenza Virus 1 NOT DETECTED NOT DETECTED Final   Parainfluenza Virus 2 NOT DETECTED NOT DETECTED Final   Parainfluenza Virus 3 NOT DETECTED NOT DETECTED Final   Parainfluenza Virus 4 NOT DETECTED NOT DETECTED Final   Respiratory Syncytial Virus NOT DETECTED NOT DETECTED Final   Bordetella pertussis NOT DETECTED NOT DETECTED Final   Bordetella Parapertussis NOT DETECTED NOT DETECTED Final   Chlamydophila pneumoniae NOT DETECTED NOT DETECTED Final   Mycoplasma pneumoniae NOT DETECTED NOT DETECTED Final    Comment: Performed at [name],  [address] [place], [place] [address]       Radiology Studies: CT ABDOMEN PELVIS WO CONTRAST  Result Date: 01/03/2021 CLINICAL DATA:  Abdominal pain. Decreased oral intake for 3 days EXAM: CT ABDOMEN AND PELVIS WITHOUT CONTRAST TECHNIQUE: Multidetector CT imaging of the abdomen and pelvis was performed following the standard protocol without IV contrast. COMPARISON:  None. FINDINGS: Lower chest: No acute abnormality. Mild bibasilar atelectasis. Hepatobiliary: No focal liver abnormality is seen. No gallstones, gallbladder wall thickening, or biliary dilatation. Pancreas: Unremarkable. No pancreatic ductal dilatation or surrounding inflammatory changes. Spleen: Normal in size without focal abnormality. Adrenals/Urinary Tract: Adrenal glands are unremarkable. Kidneys are  normal, without renal calculi, focal lesion, or hydronephrosis. Bladder is unremarkable. Stomach/Bowel: No bowel dilatation to suggest bowel obstruction. No bowel wall thickening or inflammatory changes. Normal decompressed stomach. Normal appendix. Large amount of stool in the rectum which is dilated measuring up to 10 cm. Vascular/Lymphatic: Normal caliber abdominal aorta with mild atherosclerosis. No lymphadenopathy. Reproductive: No adnexal mass or focal abnormality. Other: No abdominal wall hernia or abnormality. No abdominopelvic ascites. Musculoskeletal: No acute osseous abnormality. No aggressive osseous lesion. Severe bilateral facet arthropathy of the lumbar spine. 2 mm retrolisthesis of L5 on S1. Bilateral foraminal stenosis at L5-S1. IMPRESSION: 1. Severe rectal fecal impaction with the rectum dilated up to 10 cm. No bowel obstruction. 2.  Aortic Atherosclerosis (ICD10-I70.0). 3. Lumbar spine spondylosis. Electronically Signed   By: Kathreen Devoid   On: 01/03/2021 12:41   CT Head Wo Contrast  Result Date: 01/03/2021 CLINICAL DATA:  Delirium EXAM: CT HEAD WITHOUT CONTRAST TECHNIQUE: Contiguous axial images were obtained from the base of the skull through the vertex without intravenous contrast. COMPARISON:  MRI head September 05, 2020. 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 While evaluation is somewhat limited across modalities, white matter edema in the high right frontoparietal region may be increased compared to recent MRI from September 05, 2020, appearing to extend farther anteriorly in the frontal lobe. Amorphous hyperdensity at the vertex on the right, likely represents the areas of enhancement seen on prior MRI with limited evaluation on this noncontrast head CT. Similar encephalomalacia in the lateral right temporal lobe, compatible with prior hemorrhagic infarct. No evidence of acute hemorrhage. No midline shift. Similar atrophy with ex vacuo ventricular  dilation. No hydrocephalus. No visible extra-axial fluid collections Vascular: No hyperdense vessel identified. Skull: Prior high right craniotomy.  No acute fracture. Sinuses/Orbits: Visualized sinuses are clear. No acute orbital findings. Other: No mastoid effusions. IMPRESSION: 1. Prior right craniotomy for high right convexity tumor resection. While evaluation is somewhat limited across modalities, white matter edema in the high right frontoparietal region may be increased compared to recent MRI from September 05, 2020, appearing to extend further anteriorly in the frontal lobe. Recommend an MRI with contrast to further characterize and to allow for more direct comparison. 2. Amorphous hyperdensity at the vertex on the right, likely represents the areas of enhancement seen on prior MRI with limited evaluation on this noncontrast head CT. 3. Similar encephalomalacia in the lateral right temporal lobe, compatible with prior hemorrhagic infarct. Electronically Signed   By: Margaretha Sheffield MD   On: 01/03/2021 13:01   MR Brain W and Wo Contrast  Result Date: 01/03/2021 CLINICAL DATA:  Metastatic lung cancer post resection and radiation EXAM: MRI HEAD WITHOUT AND WITH CONTRAST TECHNIQUE: Multiplanar, multiecho pulse sequences of the brain and surrounding structures were obtained without and with intravenous contrast. CONTRAST:  38mL GADAVIST GADOBUTROL 1 MMOL/ML IV SOLN COMPARISON:  09/05/2020 FINDINGS: Motion artifact is present particularly on postcontrast imaging. Brain: Postoperative changes are again identified in the high right parietal region there is persistent enhancement underlying the craniotomy. This may be slightly increased. Extensive T2 hyperintensity in the underlying frontoparietal lobes has increased in extent, for example anteriorly within the superior frontal gyrus and within the middle frontal gyrus. There is no acute infarction. Chronic blood products are noted in the above postoperative area.  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 No definite new mass or abnormal enhancement remote from above. No significant mass effect. Vascular: Major vessel flow voids at the skull base are preserved. Skull and upper cervical spine: Normal marrow signal is preserved. Sinuses/Orbits: Paranasal sinuses are aerated. Orbits are unremarkable. Other: Sella is unremarkable.  Mastoid air cells are clear. IMPRESSION: Significantly motion degraded. Persistent and possibly slightly increased enhancement underlying the right parietal craniotomy. Extent of right frontoparietal T2 hyperintensity has increased. May be radiation therapy related. Electronically Signed   By: Macy Mis M.D.   On: 01/03/2021 15:41    Scheduled Meds:  apixaban  5 mg Oral BID   bictegravir-emtricitabine-tenofovir AF  1 tablet Oral Daily   busPIRone  2.5 mg Oral TID   guaiFENesin  600 mg Oral BID   ipratropium-albuterol  3 mL Nebulization BID   lidocaine  1 patch Transdermal Daily   mometasone-formoterol  2 puff Inhalation BID   montelukast  10 mg Oral QHS   polyethylene glycol  17 g Oral BID   QUEtiapine Fumarate  150 mg Oral QHS   rOPINIRole  1 mg Oral QHS   senna-docusate  1 tablet Oral QHS   venlafaxine XR  150 mg Oral Daily   Continuous Infusions:  ceFEPime (MAXIPIME) IV 2 g (01/05/21 1030)   [START ON 01/06/2021] vancomycin     vancomycin 1,750 mg (01/05/21 1034)     LOS: 2 days   Time spent: 30 minutes   Darliss Cheney, MD [facility]  01/05/2021, 11:15 AM   How to contact the [facility] Attending or Consulting provider [place] or covering provider during after hours [place], for this patient?  Check the care team in [facility] and look for a) attending/consulting [facility] provider listed and b) the [facility] team listed. Page or secure chat [phone]. Log into [email or web address] and use [place]'s universal password to access. If you do not have the password, please contact the hospital operator. Locate  the [facility] provider you are looking for under [facility] and page to a number that you can be directly reached. If you still have difficulty reaching the provider, please page the [facility] (Director on Call) for the Hospitalists listed on amion for assistance.

## 2021-01-05 NOTE — Progress Notes (Signed)
Pharmacy Antibiotic Note  [name] is a 75 y.o. female admitted on 01/03/2021 with weakness, fever, headaches.  Pharmacy has been consulted for Vancomycin and Cefepime dosing for empiric coverage.  Tm 101, WBC remains elevated at 12.8 (Dexamethasone 10mg  IV given on 7/8).  Plan: Cefepime 2g IV q8h Vancomycin 1750 mg IV once, then 1250 mg IV q24h. (SCr 1, est AUC 522) Measure Vanc peak and trough at steady state. Goal AUC = 400 - 550. Follow up renal function, culture results, and clinical course.   Height: 5\' 9"  (175.3 cm) Weight: 99.8 kg (220 lb) IBW/kg (Calculated) : 66.2  Temp (24hrs), Avg:99.3 F (37.4 C), Min:97.9 F (36.6 C), Max:101 F (38.3 C)  Recent Labs  Lab 01/03/21 1028 01/04/21 0022 01/04/21 0148 01/05/21 0339  WBC 14.2* 12.7*  --  12.8*  CREATININE 3.26*  --  1.70* 1.00  LATICACIDVEN 1.0  --   --   --     Estimated Creatinine Clearance: 63 mL/min (by C-G formula based on SCr of 1 mg/dL).    No Known Allergies  Antimicrobials this admission: 7/10 Vancomycin >>  7/10 Cefepime >>   Dose adjustments this admission:   Microbiology results: 7/8 BCx (only one set) 1/2 bottles GPC clusters.  BCID: Staphylococcus species (suspected contaminant) 7/8 Group A Strep PCR: not detected 7/8 Influenza: neg, Covid: neg 7/8 UCx:  7/9 BCx: ngtd 7/9 Respiratory panel: none detected  Thank you for allowing pharmacy to be a part of this patient's care.  Gretta Arab PharmD, BCPS Clinical Pharmacist [facility] main pharmacy [phone] 01/05/2021 8:54 AM

## 2021-01-06 ENCOUNTER — Inpatient Hospital Stay (HOSPITAL_COMMUNITY): Payer: Medicare HMO

## 2021-01-06 LAB — URINE CULTURE: Culture: 70000 — AB

## 2021-01-06 LAB — CBC WITH DIFFERENTIAL/PLATELET
Abs Immature Granulocytes: 0.19 10*3/uL — ABNORMAL HIGH (ref 0.00–0.07)
Basophils Absolute: 0 10*3/uL (ref 0.0–0.1)
Basophils Relative: 0 %
Eosinophils Absolute: 0.1 10*3/uL (ref 0.0–0.5)
Eosinophils Relative: 1 %
HCT: 29.5 % — ABNORMAL LOW (ref 36.0–46.0)
Hemoglobin: 8.8 g/dL — ABNORMAL LOW (ref 12.0–15.0)
Immature Granulocytes: 2 %
Lymphocytes Relative: 15 %
Lymphs Abs: 1.5 10*3/uL (ref 0.7–4.0)
MCH: 26.5 pg (ref 26.0–34.0)
MCHC: 29.8 g/dL — ABNORMAL LOW (ref 30.0–36.0)
MCV: 88.9 fL (ref 80.0–100.0)
Monocytes Absolute: 0.7 10*3/uL (ref 0.1–1.0)
Monocytes Relative: 6 %
Neutro Abs: 7.9 10*3/uL — ABNORMAL HIGH (ref 1.7–7.7)
Neutrophils Relative %: 76 %
Platelets: 243 10*3/uL (ref 150–400)
RBC: 3.32 MIL/uL — ABNORMAL LOW (ref 3.87–5.11)
RDW: 15.9 % — ABNORMAL HIGH (ref 11.5–15.5)
WBC: 10.3 10*3/uL (ref 4.0–10.5)
nRBC: 0 % (ref 0.0–0.2)

## 2021-01-06 LAB — BASIC METABOLIC PANEL
Anion gap: 10 (ref 5–15)
BUN: 13 mg/dL (ref 8–23)
CO2: 24 mmol/L (ref 22–32)
Calcium: 8.2 mg/dL — ABNORMAL LOW (ref 8.9–10.3)
Chloride: 102 mmol/L (ref 98–111)
Creatinine, Ser: 0.86 mg/dL (ref 0.44–1.00)
GFR, Estimated: >60 mL/min (ref >60)
Glucose, Bld: 96 mg/dL (ref 70–99)
Potassium: 3.7 mmol/L (ref 3.5–5.1)
Sodium: 136 mmol/L (ref 135–145)

## 2021-01-06 LAB — CULTURE, BLOOD (ROUTINE X 2)

## 2021-01-06 MED ORDER — IOHEXOL 300 MG/ML  SOLN
100.0000 mL | Freq: Once | INTRAMUSCULAR | Status: AC | PRN
  Administered 2021-01-06: 100 mL via INTRAVENOUS

## 2021-01-06 NOTE — Care Management Important Message (Signed)
Important Message  Patient Details IM Letter given to the Patient. Name: [name] MRN: [id number] Date of Birth: 29-Nov-1945   Medicare Important Message Given:  Yes     Kerin Salen 01/06/2021, 9:56 AM

## 2021-01-06 NOTE — Progress Notes (Addendum)
PROGRESS NOTE    [name]  ZOX:[id number] DOB: 1946/01/23 DOA: 01/03/2021 PCP: Nolene Ebbs, MD   Brief Narrative:  [name] is a 75 y.o. female with medical history significant of COPD on 2L O2, lung cancer w/ mets to brain, HIV, HLD. Presented with generalized weakness and fever. She reports that she has not had much of an appetite in the last several days. She denies N/V. She has noted increased headache and fatigue. Her facility reports that she had fever up to 102 over the last several days. She's had O2 sats down into the 80's. She's required an increase of her chronic O2 support. They became concerned and sent her to the ED for eval.   ED Course: CXR was negative. CTH was concerning for new tumor. An MRI was obtained. She was noted to be in AKI. Fluids were started.   Assessment & Plan:   Active Problems:   AKI (acute kidney injury) ([place])  AKI/dehydration: Due to poor p.o. intake.  CT abdomen/pelvis unremarkable.  AKI resolved.    Abdominal pain/constipation: Fecal disimpaction done in the ED.  Continue MiraLAX and senna.  No more pain today.  Generalized weakness: Likely chronic due to multiple medical issues.  PT OT consulted. They recommended returning to SNF for long-term care.  Fever/bacteremia: Continues to have intermittent fever.  However leukocytosis resolved.  Still no source identified.  Initial 1 set of blood culture was drawn which was positive for stepwise.  Repeat blood culture from 01/04/2021 remains negative so far.  Patient now complains of back pain.  On examination she is tender however it is more of paraspinal tenderness.  Unsure whether this is acute or chronic.  She is unable to clarify the history.  Due to bacteremia, need to investigate further.  We will proceed with CT thoracolumbar spine with contrast.  Continue current antibiotics/cefepime and vancomycin.     COPD/acute on chronic hypoxic respiratory failure: Uses 2 to 3 L of oxygen at home  but was requiring 6 L yesterday.  She is down to 2 L now.  Lungs clear to auscultation.  We will continue bronchodilators and home medications.   HIV: continue home regimen/Biktarvy   Hx of PTE: Continue Eliquis.   Hx of lung cancer w/ brain mets, s/p brain tumor resection and radiation Headache     - CTH was concerning for possibility of a new tumor in the right frontoparietal lobe. An MRI was obtained showing an enhancement along that region.  Admitting hospitalist discussed the case with Dr. Lisbeth Renshaw and Dr. Mickeal Skinner.  According to them, her case will be brought before the tumor board. Dr. Mickeal Skinner recommends holding off on steroids for right now.   Goals of Care: Admitting hospitalist spoke with family/HCPOA; they have decided to go with FULL code: Palliative care consulted at admission but has not seen patient yet.  I have notified palliative care PA today.  DVT prophylaxis:   Eliquis   Code Status: Full Code  Family Communication:  None present at bedside.  Plan of care discussed with patient in length and he verbalized understanding and agreed with it.  Status is: Inpatient  Remains inpatient appropriate because:Inpatient level of care appropriate due to severity of illness  Dispo: The patient is from: ALF              Anticipated d/c is to: ALF              Patient currently is not medically stable to  d/c.   Difficult to place patient No   Estimated body mass index is 32.49 kg/m as calculated from the following:   Height as of this encounter: 5\' 9"  (1.753 m).   Weight as of this encounter: 99.8 kg.     Nutritional status:  Consultants:  None  Procedures:  None  Antimicrobials:  Anti-infectives (From admission, onward)    Start     Dose/Rate Route Frequency Ordered Stop   01/06/21 1000  vancomycin (VANCOREADY) IVPB 1250 mg/250 mL        1,250 mg 166.7 mL/hr over 90 Minutes Intravenous Every 24 hours 01/05/21 0923     01/05/21 1000  vancomycin (VANCOREADY) IVPB 1750  mg/350 mL        1,750 mg 175 mL/hr over 120 Minutes Intravenous  Once 01/05/21 0912 01/05/21 1230   01/05/21 1000  ceFEPIme (MAXIPIME) 2 g in sodium chloride 0.9 % 100 mL IVPB        2 g 200 mL/hr over 30 Minutes Intravenous Every 8 hours 01/05/21 0913     01/04/21 1000  bictegravir-emtricitabine-tenofovir AF (BIKTARVY) 50-200-25 MG per tablet 1 tablet        1 tablet Oral Daily 01/03/21 1830            Subjective: Seen and examined.  Now she complains of back pain.  She is unable to tell me the duration of pain.  She has no other complaint.  Objective: Vitals:   01/05/21 2134 01/06/21 0548 01/06/21 0826 01/06/21 1252  BP: 133/67 133/69  111/67  Pulse: 95 92  83  Resp: 18 20  20   Temp: (!) 101 F (38.3 C) 99.7 F (37.6 C)  98.4 F (36.9 C)  TempSrc: Axillary Oral  Oral  SpO2: 95% 95% 98% 98%  Weight:      Height:        Intake/Output Summary (Last 24 hours) at 01/06/2021 1403 Last data filed at 01/06/2021 0940 Gross per 24 hour  Intake 1295.6 ml  Output 1400 ml  Net -104.4 ml    Filed Weights   01/03/21 0825  Weight: 99.8 kg    Examination:  General exam: Appears calm and comfortable, chronically sick looking Respiratory system: Clear to auscultation. Respiratory effort normal. Cardiovascular system: S1 & S2 heard, RRR. No JVD, murmurs, rubs, gallops or clicks. No pedal edema. Gastrointestinal system: Abdomen is nondistended, soft and nontender. No organomegaly or masses felt. Normal bowel sounds heard. Central nervous system: Alert and oriented.  Left hemiparesis Tenderness along paraspinal area around thoracolumbar area Skin: No rashes, lesions or ulcers.  Psychiatry: Judgement and insight appear poor.  Mood and affect flat.   Data Reviewed: I have personally reviewed following labs and imaging studies  CBC: Recent Labs  Lab 01/03/21 1028 01/04/21 0022 01/05/21 0339 01/06/21 0353  WBC 14.2* 12.7* 12.8* 10.3  NEUTROABS 11.6* 11.4* 10.2* 7.9*  HGB  10.8* 9.5* 9.3* 8.8*  HCT 36.1 30.8* 29.4* 29.5*  MCV 88.9 88.3 87.5 88.9  PLT 237 218 202 502    Basic Metabolic Panel: Recent Labs  Lab 01/03/21 1028 01/04/21 0148 01/05/21 0339 01/06/21 0353  NA 139 140 140 136  K 3.6 3.9 4.5 3.7  CL 100 102 104 102  CO2 27 26 28 24   GLUCOSE 106* 138* 97 96  BUN 36* 30* 25* 13  CREATININE 3.26* 1.70* 1.00 0.86  CALCIUM 8.9 8.8* 8.6* 8.2*    GFR: Estimated Creatinine Clearance: 73.2 mL/min (by C-G formula based on SCr of  0.86 mg/dL). Liver Function Tests: Recent Labs  Lab 01/03/21 1028 01/04/21 0148  AST 24 24  ALT 21 21  ALKPHOS 68 72  BILITOT 0.8 0.8  PROT 8.9* 8.2*  ALBUMIN 3.7 3.3*    Recent Labs  Lab 01/03/21 1028  LIPASE 21    No results for input(s): AMMONIA in the last 168 hours. Coagulation Profile: Recent Labs  Lab 01/03/21 1028  INR 1.6*    Cardiac Enzymes: No results for input(s): CKTOTAL, CKMB, CKMBINDEX, TROPONINI in the last 168 hours. BNP (last 3 results) No results for input(s): PROBNP in the last 8760 hours. HbA1C: No results for input(s): HGBA1C in the last 72 hours. CBG: No results for input(s): GLUCAP in the last 168 hours. Lipid Profile: No results for input(s): CHOL, HDL, LDLCALC, TRIG, CHOLHDL, LDLDIRECT in the last 72 hours. Thyroid Function Tests: No results for input(s): TSH, T4TOTAL, FREET4, T3FREE, THYROIDAB in the last 72 hours. Anemia Panel: No results for input(s): VITAMINB12, FOLATE, FERRITIN, TIBC, IRON, RETICCTPCT in the last 72 hours. Sepsis Labs: Recent Labs  Lab 01/03/21 1028  LATICACIDVEN 1.0     Recent Results (from the past 240 hour(s))  Resp Panel by RT-PCR (Flu A&B, Covid) Nasopharyngeal Swab     Status: None   Collection Time: 01/03/21  9:32 AM   Specimen: Nasopharyngeal Swab; Nasopharyngeal(NP) swabs in vial transport medium  Result Value Ref Range Status   SARS Coronavirus 2 by RT PCR NEGATIVE NEGATIVE Final    Comment: (NOTE) SARS-CoV-2 target nucleic  acids are NOT DETECTED.  The SARS-CoV-2 RNA is generally detectable in upper respiratory specimens during the acute phase of infection. The lowest concentration of SARS-CoV-2 viral copies this assay can detect is 138 copies/mL. A negative result does not preclude SARS-Cov-2 infection and should not be used as the sole basis for treatment or other patient management decisions. A negative result may occur with  improper specimen collection/handling, submission of specimen other than nasopharyngeal swab, presence of viral mutation(s) within the areas targeted by this assay, and inadequate number of viral copies(<138 copies/mL). A negative result must be combined with clinical observations, patient history, and epidemiological information. The expected result is Negative.  Fact Sheet for Patients:  EntrepreneurPulse.com.au  Fact Sheet for Healthcare Providers:  IncredibleEmployment.be  This test is no t yet approved or cleared by the [place] FDA and  has been authorized for detection and/or diagnosis of SARS-CoV-2 by FDA under an Emergency Use Authorization (EUA). This EUA will remain  in effect (meaning this test can be used) for the duration of the COVID-19 declaration under Section 564(b)(1) of the Act, 21 U.S.C.section 360bbb-3(b)(1), unless the authorization is terminated  or revoked sooner.       Influenza A by PCR NEGATIVE NEGATIVE Final   Influenza B by PCR NEGATIVE NEGATIVE Final    Comment: (NOTE) The Xpert Xpress SARS-CoV-2/FLU/RSV plus assay is intended as an aid in the diagnosis of influenza from Nasopharyngeal swab specimens and should not be used as a sole basis for treatment. Nasal washings and aspirates are unacceptable for Xpert Xpress SARS-CoV-2/FLU/RSV testing.  Fact Sheet for Patients: EntrepreneurPulse.com.au  Fact Sheet for Healthcare Providers: IncredibleEmployment.be  This  test is not yet approved or cleared by the [place] FDA and has been authorized for detection and/or diagnosis of SARS-CoV-2 by FDA under an Emergency Use Authorization (EUA). This EUA will remain in effect (meaning this test can be used) for the duration of the COVID-19 declaration under Section 564(b)(1) of the  Act, 21 U.S.C. section 360bbb-3(b)(1), unless the authorization is terminated or revoked.  Performed at [facility], [place] [address] [place], [place] [address]   Group A Strep by PCR     Status: None   Collection Time: 01/03/21  9:33 AM   Specimen: Throat; Sterile Swab  Result Value Ref Range Status   Group A Strep by PCR NOT DETECTED NOT DETECTED Final    Comment: Performed at [facility], [place] [address] [place], [place] [address]  Blood Culture (routine x 2)     Status: Abnormal   Collection Time: 01/03/21 10:28 AM   Specimen: BLOOD RIGHT ARM  Result Value Ref Range Status   Specimen Description   Final    BLOOD RIGHT ARM Performed at [place] [address] [place], [place] [address]    Special Requests   Final    BOTTLES DRAWN AEROBIC AND ANAEROBIC Blood Culture results may not be optimal due to an inadequate volume of blood received in culture bottles Performed at [place] [address] [place], [place] [address]    Culture  Setup Time   Final    GRAM POSITIVE COCCI IN CLUSTERS AEROBIC BOTTLE ONLY CRITICAL RESULT CALLED TO, READ BACK BY AND VERIFIED WITH: JUSTIN LEGGE PHARMD @0822  01/04/21 EB    Culture (A)  Final    STAPHYLOCOCCUS HOMINIS THE SIGNIFICANCE OF ISOLATING THIS ORGANISM FROM A SINGLE VENIPUNCTURE CANNOT BE PREDICTED WITHOUT FURTHER CLINICAL AND CULTURE CORRELATION. SUSCEPTIBILITIES AVAILABLE ONLY ON REQUEST. Performed at [facility], [place] [address] [place],  [address]    Report Status 01/06/2021 FINAL  Final  Blood Culture ID Panel  (Reflexed)     Status: Abnormal   Collection Time: 01/03/21 10:28 AM  Result Value Ref Range Status   Enterococcus faecalis NOT DETECTED NOT DETECTED Final   Enterococcus Faecium NOT DETECTED NOT DETECTED Final   Listeria monocytogenes NOT DETECTED NOT DETECTED Final   Staphylococcus species DETECTED (A) NOT DETECTED Final    Comment: CRITICAL RESULT CALLED TO, READ BACK BY AND VERIFIED WITH: JUSTIN LEGGE PHARMD @0822  01/04/21 EB    Staphylococcus aureus (BCID) NOT DETECTED NOT DETECTED Final   Staphylococcus epidermidis NOT DETECTED NOT DETECTED Final   Staphylococcus lugdunensis NOT DETECTED NOT DETECTED Final   Streptococcus species NOT DETECTED NOT DETECTED Final   Streptococcus agalactiae NOT DETECTED NOT DETECTED Final   Streptococcus pneumoniae NOT DETECTED NOT DETECTED Final   Streptococcus pyogenes NOT DETECTED NOT DETECTED Final   A.calcoaceticus-baumannii NOT DETECTED NOT DETECTED Final   Bacteroides fragilis NOT DETECTED NOT DETECTED Final   Enterobacterales NOT DETECTED NOT DETECTED Final   Enterobacter cloacae complex NOT DETECTED NOT DETECTED Final   Escherichia coli NOT DETECTED NOT DETECTED Final   Klebsiella aerogenes NOT DETECTED NOT DETECTED Final   Klebsiella oxytoca NOT DETECTED NOT DETECTED Final   Klebsiella pneumoniae NOT DETECTED NOT DETECTED Final   Proteus species NOT DETECTED NOT DETECTED Final   Salmonella species NOT DETECTED NOT DETECTED Final   Serratia marcescens NOT DETECTED NOT DETECTED Final   Haemophilus influenzae NOT DETECTED NOT DETECTED Final   Neisseria meningitidis NOT DETECTED NOT DETECTED Final   Pseudomonas aeruginosa NOT DETECTED NOT DETECTED Final   Stenotrophomonas maltophilia NOT DETECTED NOT DETECTED Final   Candida albicans NOT DETECTED NOT DETECTED Final   Candida auris NOT DETECTED NOT DETECTED Final   Candida glabrata NOT DETECTED NOT DETECTED Final   Candida krusei NOT  DETECTED NOT DETECTED Final   Candida parapsilosis NOT  DETECTED NOT DETECTED Final   Candida tropicalis NOT DETECTED NOT DETECTED Final   Cryptococcus neoformans/gattii NOT DETECTED NOT DETECTED Final    Comment: Performed at [facility], [place] [address], [place], [place] [address]  Culture, Urine     Status: Abnormal   Collection Time: 01/03/21  4:43 PM   Specimen: Urine, Catheterized  Result Value Ref Range Status   Specimen Description   Final    URINE, CATHETERIZED Performed at [place] [address], [place], [place] [address]    Special Requests   Final    NONE Performed at [facility], [place] [address], [place], [place] [address]    Culture 70,000 COLONIES/mL PROVIDENCIA STUARTII (A)  Final   Report Status 01/06/2021 FINAL  Final   Organism ID, Bacteria PROVIDENCIA STUARTII (A)  Final      Susceptibility   Providencia stuartii - MIC*    AMPICILLIN RESISTANT Resistant     CEFAZOLIN >=64 RESISTANT Resistant     CEFEPIME <=0.12 SENSITIVE Sensitive     CEFTRIAXONE <=0.25 SENSITIVE Sensitive     CIPROFLOXACIN <=0.25 SENSITIVE Sensitive     GENTAMICIN RESISTANT Resistant     IMIPENEM 2 SENSITIVE Sensitive     NITROFURANTOIN 128 RESISTANT Resistant     TRIMETH/SULFA <=20 SENSITIVE Sensitive     AMPICILLIN/SULBACTAM 16 INTERMEDIATE Intermediate     PIP/TAZO <=4 SENSITIVE Sensitive     * 70,000 COLONIES/mL PROVIDENCIA STUARTII  Culture, blood (routine x 2)     Status: None (Preliminary result)   Collection Time: 01/04/21  8:56 AM   Specimen: BLOOD RIGHT HAND  Result Value Ref Range Status   Specimen Description   Final    BLOOD RIGHT HAND Performed at [facility], [place] [address] [place], [place] [address]    Special Requests   Final    BOTTLES DRAWN AEROBIC ONLY Blood Culture adequate volume Performed at [place] [address], [place], [place] [address]    Culture   Final    NO GROWTH 2 DAYS Performed at [place] [address] [place], [place] [address]    Report Status PENDING  Incomplete  Culture, blood (routine x 2)     Status: None (Preliminary result)   Collection Time: 01/04/21  9:29 AM   Specimen: BLOOD RIGHT HAND  Result Value Ref Range Status   Specimen Description   Final    BLOOD RIGHT HAND Performed at [place] [address] [place] [address], [place], [place] [address]    Special Requests   Final    BOTTLES DRAWN AEROBIC ONLY Blood Culture results may not be optimal due to an inadequate volume of blood received in culture bottles Performed at [place] [address], [place], [place] [address]    Culture   Final    NO GROWTH 2 DAYS Performed at [place] [address] [place],  [address]    Report Status PENDING  Incomplete  Respiratory (~20 pathogens) panel by PCR     Status: None   Collection Time: 01/04/21 10:03 AM   Specimen: Nasopharyngeal Swab; Respiratory  Result Value Ref Range Status   Adenovirus NOT DETECTED NOT DETECTED Final   Coronavirus 229E NOT DETECTED NOT DETECTED Final    Comment: (NOTE) The Coronavirus on the Respiratory Panel, DOES NOT test for the novel  Coronavirus (2019  nCoV)    Coronavirus HKU1 NOT DETECTED NOT DETECTED Final   Coronavirus NL63 NOT DETECTED NOT DETECTED Final   Coronavirus OC43 NOT DETECTED NOT DETECTED Final   Metapneumovirus NOT DETECTED NOT DETECTED Final   Rhinovirus / Enterovirus NOT DETECTED NOT DETECTED Final   Influenza A NOT DETECTED NOT DETECTED Final   Influenza B NOT DETECTED NOT DETECTED Final   Parainfluenza Virus 1 NOT DETECTED NOT DETECTED Final   Parainfluenza Virus 2 NOT DETECTED NOT DETECTED Final   Parainfluenza Virus 3 NOT DETECTED NOT DETECTED Final   Parainfluenza Virus 4 NOT DETECTED NOT DETECTED Final   Respiratory Syncytial Virus NOT DETECTED NOT DETECTED Final   Bordetella pertussis NOT DETECTED NOT DETECTED Final   Bordetella Parapertussis NOT DETECTED NOT  DETECTED Final   Chlamydophila pneumoniae NOT DETECTED NOT DETECTED Final   Mycoplasma pneumoniae NOT DETECTED NOT DETECTED Final    Comment: Performed at [facility], [place] [address] [place], [place] [address]       Radiology Studies: No results found.  Scheduled Meds:  apixaban  5 mg Oral BID   bictegravir-emtricitabine-tenofovir AF  1 tablet Oral Daily   busPIRone  2.5 mg Oral TID   guaiFENesin  600 mg Oral BID   ipratropium-albuterol  3 mL Nebulization BID   lidocaine  1 patch Transdermal Daily   mometasone-formoterol  2 puff Inhalation BID   montelukast  10 mg Oral QHS   polyethylene glycol  17 g Oral BID   QUEtiapine Fumarate  150 mg Oral QHS   rOPINIRole  1 mg Oral QHS   senna-docusate  1 tablet Oral QHS   venlafaxine XR  150 mg Oral Daily   Continuous Infusions:  sodium chloride 75 mL/hr at 01/06/21 0400   ceFEPime (MAXIPIME) IV 2 g (01/06/21 1216)   vancomycin 1,250 mg (01/06/21 1030)     LOS: 3 days   Time spent: 28 minutes   Darliss Cheney, MD [facility]  01/06/2021, 2:03 PM   How to contact the [facility] Attending or Consulting provider [place] or covering provider during after hours [place], for this patient?  Check the care team in [facility] and look for a) attending/consulting [facility] provider listed and b) the [facility] team listed. Page or secure chat [phone]. Log into [email or web address] and use [place] universal password to access. If you do not have the password, please contact the hospital operator. Locate the [facility] provider you are looking for under [facility] and page to a number that you can be directly reached. If you still have difficulty reaching the provider, please page the [facility] (Director on Call) for the Hospitalists listed on amion for assistance.

## 2021-01-06 NOTE — NC FL2 (Signed)
[place] LEVEL OF CARE SCREENING TOOL     IDENTIFICATION  Patient Name: [name] Birthdate: [birth year] Sex: female Admission Date (Current Location): 01/03/2021  [place] and [place] Number:  [name] [id number] [place] and Address:  [facility],  [place], [place]      Provider Number: [id number]  Attending Physician Name and Address:  Darliss Cheney, MD  Relative Name and Phone Number:  Larwance Sachs [place], [place] Brother   [phone]    Current Level of Care: Hospital Recommended Level of Care: [place] Prior Approval Number:    Date Approved/Denied:   PASRR Number: [id number]  Discharge Plan: SNF    Current Diagnoses: Patient Active Problem List   Diagnosis Date Noted   AKI (acute kidney injury) ([place]) 01/03/2021   Brain metastasis ([place]) 05/22/2020   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   HIV (human immunodeficiency virus infection) ([place]) 01/02/2020   Brain mass 01/02/2020   OSA (obstructive sleep apnea) 01/02/2020   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   Cerebral edema (HCC)    Encephalopathy    HIV disease ([place])    OSA (obstructive sleep apnea)    Dyslipidemia    History of CVA with residual deficit    Brain tumor ([place]) 11/28/2019   Change in mental status 11/21/2019   Bed bug bite 07/12/2019   DNR (do not resuscitate)    Altered mental status    Acute pulmonary embolism without acute cor pulmonale ([place])    Acute ischemic right MCA stroke ([place])    Palliative care encounter    Aortic embolism or thrombosis ([place])    CVA (cerebral vascular accident) ([place]) 03/02/2019   COPD (chronic obstructive pulmonary disease) ([place]) 03/01/2019   PE (pulmonary thromboembolism) ([place]) 03/01/2019   Tobacco abuse 03/01/2019   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   CKD (chronic kidney disease), stage III ([place]) 03/01/2019   Generalized weakness 03/01/2019   Fall at home    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[facility])     Splenic infarct    Acute and chronic respiratory failure (acute-on-chronic) ([place]) 02/11/2019   CKD (chronic kidney disease) stage 3, GFR 30-59 ml/min ([place]) 08/12/2016   Lung nodule 10/12/2015   Bronchitis 09/09/2015   History of lung cancer 09/08/2015   Thoracic aorta atherosclerosis ([place]) 09/08/2015   Chronic obstructive pulmonary disease with (acute) exacerbation ([place]) 09/08/2015   Sepsis ([place]) 03/28/2015   CAP (community acquired pneumonia) 03/28/2015   UTI (lower urinary tract infection) 03/28/2015   Respiratory failure ([place]) 03/28/2015   Non-small cell lung cancer ([place])    Malignant neoplasm of upper lobe, bronchus or lung 01/03/2014   Acute hypoxemic respiratory failure ([place]) 10/31/2013   COPD with acute exacerbation ([place]) 10/30/2013   COPD exacerbation ([place]) 10/30/2013   Other and unspecified noninfectious gastroenteritis and colitis(558.9) 11/20/2012   Rectal bleed 11/19/2012   Environmental allergies 11/02/2012   Obesity 05/05/2012   Renal insufficiency 05/05/2012   Hyperglycemia 05/05/2012   Asthma 12/10/2011   Cigarette smoker 12/10/2011   Hypercholesteremia 12/11/2010   Hypertension 12/11/2010   ALLERGIC RHINITIS, SEASONAL 07/03/2010   COPD 07/03/2010   HIV (human immunodeficiency virus infection) ([place]) 12/09/2006   DEPRESSION 12/09/2006   BREAST MASS, BENIGN 12/09/2006   OSTEOPOROSIS 12/09/2006    Orientation RESPIRATION BLADDER Height & Weight     Self  O2 (3L) Incontinent Weight: 220 lb (99.8 kg) Height:  5\' 9"  (175.3 cm)  BEHAVIORAL SYMPTOMS/MOOD NEUROLOGICAL BOWEL NUTRITION STATUS      Incontinent Diet (Cardiac)  AMBULATORY STATUS COMMUNICATION OF NEEDS Skin   Extensive Assist Verbally Normal                       Personal Care Assistance Level of Assistance  Bathing, Dressing Bathing Assistance: Maximum assistance Feeding assistance: Limited assistance Dressing Assistance: Maximum assistance     Functional Limitations Info  Sight, Hearing,  Speech Sight Info: Adequate Hearing Info: Adequate Speech Info: Adequate    SPECIAL CARE FACTORS FREQUENCY  PT (By licensed PT), OT (By licensed OT)     PT Frequency: Minimum 2x a week OT Frequency: Minimum 2x a week            Contractures Contractures Info: Not present    Additional Factors Info  Code Status, Allergies, Psychotropic Code Status Info: Full Code Allergies Info: NKA Psychotropic Info: busPIRone (BUSPAR) tablet 2.5 mg, QUEtiapine (SEROQUEL XR) 24 hr tablet 150 mg, venlafaxine XR (EFFEXOR-XR) 24 hr capsule 150 mg         Current Medications (01/06/2021):  This is the current hospital active medication list Current Facility-Administered Medications  Medication Dose Route Frequency Provider Last Rate Last Admin   acetaminophen (TYLENOL) tablet 650 mg  650 mg Oral Q6H PRN Darliss Cheney, MD   650 mg at 01/05/21 1010   albuterol (PROVENTIL) (2.5 MG/3ML) 0.083% nebulizer solution 2.5 mg  2.5 mg Nebulization Q4H PRN Marylyn Ishihara, Tyrone A, DO       apixaban (ELIQUIS) tablet 5 mg  5 mg Oral BID Marylyn Ishihara, Tyrone A, DO   5 mg at 01/06/21 1027   bictegravir-emtricitabine-tenofovir AF (BIKTARVY) 50-200-25 MG per tablet 1 tablet  1 tablet Oral Daily Kyle, Tyrone A, DO   1 tablet at 01/06/21 1027   busPIRone (BUSPAR) tablet 2.5 mg  2.5 mg Oral TID Cherylann Ratel A, DO   2.5 mg at 01/06/21 1700   ceFEPIme (MAXIPIME) 2 g in sodium chloride 0.9 % 100 mL IVPB  2 g Intravenous Q8H Shade, Christine E, RPH 200 mL/hr at 01/06/21 1700 2 g at 01/06/21 1700   guaiFENesin (MUCINEX) 12 hr tablet 600 mg  600 mg Oral BID Kyle, Tyrone A, DO   600 mg at 01/06/21 1026   ipratropium-albuterol (DUONEB) 0.5-2.5 (3) MG/3ML nebulizer solution 3 mL  3 mL Nebulization BID Pahwani, Einar Grad, MD   3 mL at 01/06/21 0825   lidocaine (LIDODERM) 5 % 1 patch  1 patch Transdermal Daily Kyle, Tyrone A, DO   1 patch at 01/06/21 1027   mometasone-formoterol (DULERA) 200-5 MCG/ACT inhaler 2 puff  2 puff Inhalation BID Marylyn Ishihara, Tyrone  A, DO   2 puff at 01/06/21 0825   montelukast (SINGULAIR) tablet 10 mg  10 mg Oral QHS Kyle, Tyrone A, DO   10 mg at 01/05/21 2141   oxyCODONE (Oxy IR/ROXICODONE) immediate release tablet 5 mg  5 mg Oral Q8H PRN Marylyn Ishihara, Tyrone A, DO   5 mg at 01/05/21 1824   polyethylene glycol (MIRALAX / GLYCOLAX) packet 17 g  17 g Oral BID Marylyn Ishihara, Tyrone A, DO   17 g at 01/06/21 1216   QUEtiapine (SEROQUEL XR) 24 hr tablet 150 mg  150 mg Oral QHS Kyle, Tyrone A, DO   150 mg at 01/05/21 2146   rOPINIRole (REQUIP) tablet 1 mg  1 mg Oral QHS Kyle, Tyrone A, DO   1 mg at 01/05/21 2141   senna-docusate (Senokot-S) tablet 1 tablet  1 tablet Oral QHS Kyle, Tyrone A, DO   1 tablet at  01/05/21 2141   simethicone (MYLICON) chewable tablet 80 mg  80 mg Oral Q6H PRN Marylyn Ishihara, Tyrone A, DO       vancomycin (VANCOREADY) IVPB 1250 mg/250 mL  1,250 mg Intravenous Q24H Shade, Christine E, RPH 166.7 mL/hr at 01/06/21 1030 1,250 mg at 01/06/21 1030   venlafaxine XR (EFFEXOR-XR) 24 hr capsule 150 mg  150 mg Oral Daily Kyle, Tyrone A, DO   150 mg at 01/06/21 1028     Discharge Medications: Please see discharge summary for a list of discharge medications.  Relevant Imaging Results:  Relevant Lab Results:   Additional Information SSN [id number]  Ross Ludwig, LCSW

## 2021-01-06 NOTE — TOC Initial Note (Addendum)
Transition of Care [facility]) - Initial/Assessment Note    Patient Details  Name: [name] MRN: [id number] Date of Birth: [birth year]  Transition of Care [facility]) CM/SW Contact:    Ross Ludwig, LCSW Phone Number: 01/06/2021, 5:21 PM  Clinical Narrative:                  Patient is a LTC resident at [facility].  Patient can return once she is medically ready for discharge.  CSW faxed clinicals to SNF.  CSW to continue to follow patient's progress throughout discharge planning.  Expected Discharge Plan: [place] Barriers to Discharge: Continued Medical Work up   Patient Goals and CMS Choice Patient states their goals for this hospitalization and ongoing recovery are:: To return back to [facility] where she is a LTC resident. CMS Medicare.gov Compare Post Acute Care list provided to:: Patient Choice offered to / list presented to : Patient  Expected Discharge Plan and Services Expected Discharge Plan: [place] Acute Care Choice: [place] Living arrangements for the past 2 months: [place]                                      Prior Living Arrangements/Services Living arrangements for the past 2 months: [place] Lives with:: Facility Resident Patient language and need for interpreter reviewed:: Yes Do you feel safe going back to the place where you live?: Yes      Need for Family Participation in Patient Care: Yes (Comment) Care giver support system in place?: Yes (comment)   Criminal Activity/Legal Involvement Pertinent to Current Situation/Hospitalization: No - Comment as needed  Activities of Daily Living Home Assistive Devices/Equipment: Blood pressure cuff, Grab bars around toilet, Grab bars in shower, Hand-held shower hose, Hospital bed, [employer], Scales, Wheelchair, Nebulizer ADL Screening (condition at time of admission) Patient's cognitive  ability adequate to safely complete daily activities?: No Is the patient deaf or have difficulty hearing?: No Does the patient have difficulty seeing, even when wearing glasses/contacts?: No Does the patient have difficulty concentrating, remembering, or making decisions?: Yes Patient able to express need for assistance with ADLs?: Yes Does the patient have difficulty dressing or bathing?: Yes Independently performs ADLs?: No Communication: Independent Dressing (OT): Needs assistance Is this a change from baseline?: Pre-admission baseline Grooming: Needs assistance Is this a change from baseline?: Pre-admission baseline Feeding: Needs assistance Is this a change from baseline?: Pre-admission baseline Bathing: Needs assistance Is this a change from baseline?: Pre-admission baseline Toileting: Dependent Is this a change from baseline?: Pre-admission baseline In/Out Bed: Dependent Is this a change from baseline?: Pre-admission baseline Walks in Home: Dependent (reports using a wheelchair to get around in the facility) Is this a change from baseline?: Pre-admission baseline Does the patient have difficulty walking or climbing stairs?: Yes (secondary to weakness) Weakness of Legs: Both Weakness of Arms/Hands: Both  Permission Sought/Granted Permission sought to share information with : Case Manager, Family Supports, Customer service manager Permission granted to share information with : Yes, Release of Information Signed  Share Information with NAME: [facility] Sister [phone]    [name], [name] Brother   [phone]           Emotional Assessment Appearance:: Appears stated age   Affect (typically observed): Accepting, Appropriate, Calm, Pleasant, Stable Orientation: : Oriented to Self Alcohol / Substance Use:  Not Applicable Psych Involvement: No (comment)  Admission diagnosis:  AKI (acute kidney injury) ([place]) [N17.9] Patient Active Problem List   Diagnosis Date  Noted   AKI (acute kidney injury) ([place]) 01/03/2021   Brain metastasis ([place]) 05/22/2020   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   HIV (human immunodeficiency virus infection) ([place]) 01/02/2020   Brain mass 01/02/2020   OSA (obstructive sleep apnea) 01/02/2020   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   Cerebral edema (HCC)    Encephalopathy    HIV disease ([place])    OSA (obstructive sleep apnea)    Dyslipidemia    History of CVA with residual deficit    Brain tumor ([place]) 11/28/2019   Change in mental status 11/21/2019   Bed bug bite 07/12/2019   DNR (do not resuscitate)    Altered mental status    Acute pulmonary embolism without acute cor pulmonale (HCC)    Acute ischemic right MCA stroke ([place])    Palliative care encounter    Aortic embolism or thrombosis ([place])    CVA (cerebral vascular accident) ([place]) 03/02/2019   COPD (chronic obstructive pulmonary disease) ([place]) 03/01/2019   PE (pulmonary thromboembolism) ([place]) 03/01/2019   Tobacco abuse 03/01/2019   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   CKD (chronic kidney disease), stage III ([place]) 03/01/2019   Generalized weakness 03/01/2019   Fall at home    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[facility])    Splenic infarct    Acute and chronic respiratory failure (acute-on-chronic) ([place]) 02/11/2019   CKD (chronic kidney disease) stage 3, GFR 30-59 ml/min (HCC) 08/12/2016   Lung nodule 10/12/2015   Bronchitis 09/09/2015   History of lung cancer 09/08/2015   Thoracic aorta atherosclerosis ([place]) 09/08/2015   Chronic obstructive pulmonary disease with (acute) exacerbation (HCC) 09/08/2015   Sepsis ([place]) 03/28/2015   CAP (community acquired pneumonia) 03/28/2015   UTI (lower urinary tract infection) 03/28/2015   Respiratory failure ([place]) 03/28/2015   Non-small cell lung cancer ([place])    Malignant neoplasm of upper lobe, bronchus or lung 01/03/2014   Acute hypoxemic respiratory failure ([place]) 10/31/2013   COPD with acute exacerbation ([place]) 10/30/2013   COPD exacerbation ([place])  10/30/2013   Other and unspecified noninfectious gastroenteritis and colitis(558.9) 11/20/2012   Rectal bleed 11/19/2012   Environmental allergies 11/02/2012   Obesity 05/05/2012   Renal insufficiency 05/05/2012   Hyperglycemia 05/05/2012   Asthma 12/10/2011   Cigarette smoker 12/10/2011   Hypercholesteremia 12/11/2010   Hypertension 12/11/2010   ALLERGIC RHINITIS, SEASONAL 07/03/2010   COPD 07/03/2010   HIV (human immunodeficiency virus infection) ([place]) 12/09/2006   DEPRESSION 12/09/2006   BREAST MASS, BENIGN 12/09/2006   OSTEOPOROSIS 12/09/2006   PCP:  Nolene Ebbs, MD Pharmacy:   [place] [place] [address] [place] [address] Phone: [phone] Fax: [place], [place] [address] [place] [address] Phone: [phone] Fax: [phone]     Social Determinants of Health (SDOH) Interventions    Readmission Risk Interventions No flowsheet data found.

## 2021-01-06 NOTE — Evaluation (Signed)
Physical Therapy Evaluation Patient Details Name: [name] MRN: [id number] DOB: March 31, 1946 Today's Date: 01/06/2021   History of Present Illness  [name] is a 75 y.o. female with medical history significant of COPD on 2L O2, lung cancer w/ mets to brain, HIV, HLD. Presented with generalized weakness and fever. CTH was concerning for new tumor. MRI was obtained showing an enhancement along right frontoparietal lobe.. Pt also noted to be in AKI.  Clinical Impression  The patient is resting in bed, stating her ankle hurts but does not state which one. Patient has significant  contractures of LUE/LE. Left leg positioned and contracted in ER and knee flexion contracture. Placed heel protectors and provided a palm protector for left hand. Patient  does not participate in bed mobility efforts, more resistive than helpful, especially to attempt roll to left with significant LE flexion contractures.  Patient comes from  Long term care. No skilled PT needs identified. Patient requires total care and mechanical lift  for out of bed.  PT will sign off.    Follow Up Recommendations No PT follow up (return to LTC at [facility]    Equipment Recommendations  None recommended by PT    Recommendations for Other Services       Precautions / Restrictions Precautions Precautions: Fall Precaution Comments: left hemiplegia      Mobility  Bed Mobility               General bed mobility comments: unable to  assist with rolling. especially difficult to the left due to contractures of the left hip.    Transfers                 General transfer comment: pt will require lift equipment  Ambulation/Gait                Stairs            Wheelchair Mobility    Modified Rankin (Stroke Patients Only)       Balance                                             Pertinent Vitals/Pain Faces Pain Scale: Hurts whole lot Pain Location: LUE, My ankle_  not specific to side Pain Descriptors / Indicators: Grimacing;Guarding Pain Intervention(s): Monitored during session;Repositioned (applied palm protector, and heel Pads to both, floated left heel)    Home Living Family/patient expects to be discharged to:: Skilled nursing facility                 Additional Comments: has been  at [facility] ~ 1 year.    Prior Function           Comments: pt. states she does not get oob, unsure of reliability. States she has" been at [facility] for about a year"     Hand Dominance        Extremity/Trunk Assessment   Upper Extremity Assessment LUE Deficits / Details: hemiplegic;elbow flexion contraction hand fisted,minimal wrist ROM, able to supinate and pronate forearm. unable to assess shoulder secondary to pain.Palm protector placed    Lower Extremity Assessment LLE Deficits / Details: hemiplegic, LLE contracted with hip in ER, knee in flexion , unable to IR or extend the knee.    Cervical / Trunk Assessment Cervical / Trunk Assessment: Other exceptions Cervical / Trunk Exceptions: NT,  tends to laterally til to the right  Communication   Communication:  (limited to answer questions but does answer with clear words.)  Cognition Arousal/Alertness: Awake/alert Behavior During Therapy: Flat affect Overall Cognitive Status: No family/caregiver present to determine baseline cognitive functioning                                 General Comments: Oriented to [facility], at [facility] x 1 year.      General Comments General comments (skin integrity, edema, etc.): unable    Exercises     Assessment/Plan    PT Assessment Patent does not need any further PT services;All further PT needs can be met in the next venue of care  PT Problem List Decreased range of motion;Decreased mobility;Impaired tone;Decreased activity tolerance;Decreased cognition;Pain       PT Treatment Interventions      PT Goals (Current goals can be found in  the Care Plan section)  Acute Rehab PT Goals Patient Stated Goal: none stated PT Goal Formulation: All assessment and education complete, DC therapy    Frequency     Barriers to discharge        Co-evaluation               AM-PAC PT "6 Clicks" Mobility  Outcome Measure Help needed turning from your back to your side while in a flat bed without using bedrails?: Total Help needed moving from lying on your back to sitting on the side of a flat bed without using bedrails?: Total Help needed moving to and from a bed to a chair (including a wheelchair)?: Total Help needed standing up from a chair using your arms (e.g., wheelchair or bedside chair)?: Total Help needed to walk in hospital room?: Total Help needed climbing 3-5 steps with a railing? : Total 6 Click Score: 6    End of Session   Activity Tolerance: Patient limited by pain Patient left: in bed;with call bell/phone within reach;with bed alarm set Nurse Communication: Need for lift equipment PT Visit Diagnosis: Muscle weakness (generalized) (M62.81);Other symptoms and signs involving the nervous system (R29.898)    Time: 5498-2641 PT Time Calculation (min) (ACUTE ONLY): 28 min   Charges:   PT Evaluation $PT Eval Low Complexity: 1 Low PT Treatments $Therapeutic Activity: 8-22 mins        Tresa Endo PT Acute Rehabilitation Services Pager [phone] Office [phone]   Claretha Cooper 01/06/2021, 9:09 AM

## 2021-01-07 DIAGNOSIS — G9341 Metabolic encephalopathy: Secondary | ICD-10-CM | POA: Diagnosis present

## 2021-01-07 DIAGNOSIS — J9611 Chronic respiratory failure with hypoxia: Secondary | ICD-10-CM

## 2021-01-07 DIAGNOSIS — C7931 Secondary malignant neoplasm of brain: Secondary | ICD-10-CM

## 2021-01-07 LAB — CBC WITH DIFFERENTIAL/PLATELET
Abs Immature Granulocytes: 0.28 10*3/uL — ABNORMAL HIGH (ref 0.00–0.07)
Basophils Absolute: 0.1 10*3/uL (ref 0.0–0.1)
Basophils Relative: 0 %
Eosinophils Absolute: 0.2 10*3/uL (ref 0.0–0.5)
Eosinophils Relative: 1 %
HCT: 30.6 % — ABNORMAL LOW (ref 36.0–46.0)
Hemoglobin: 9.8 g/dL — ABNORMAL LOW (ref 12.0–15.0)
Immature Granulocytes: 3 %
Lymphocytes Relative: 15 %
Lymphs Abs: 1.7 10*3/uL (ref 0.7–4.0)
MCH: 27.1 pg (ref 26.0–34.0)
MCHC: 32 g/dL (ref 30.0–36.0)
MCV: 84.8 fL (ref 80.0–100.0)
Monocytes Absolute: 0.7 10*3/uL (ref 0.1–1.0)
Monocytes Relative: 6 %
Neutro Abs: 8.4 10*3/uL — ABNORMAL HIGH (ref 1.7–7.7)
Neutrophils Relative %: 75 %
Platelets: 245 10*3/uL (ref 150–400)
RBC: 3.61 MIL/uL — ABNORMAL LOW (ref 3.87–5.11)
RDW: 15.7 % — ABNORMAL HIGH (ref 11.5–15.5)
WBC: 11.3 10*3/uL — ABNORMAL HIGH (ref 4.0–10.5)
nRBC: 0.3 % — ABNORMAL HIGH (ref 0.0–0.2)

## 2021-01-07 LAB — SARS CORONAVIRUS 2 (TAT 6-24 HRS): SARS Coronavirus 2: NEGATIVE

## 2021-01-07 NOTE — TOC Progression Note (Signed)
Transition of Care [facility]) - Progression Note    Patient Details  Name: [name] MRN: [id number] Date of Birth: 1945-08-07  Transition of Care [facility]) CM/SW Contact  Shaira Sova, Juliann Pulse, RN Phone Number: 01/07/2021, 3:51 PM  Clinical Narrative: Noted palliative are note. From [facility] LTC. [facility] care already following.      Expected Discharge Plan:  (LTC) Barriers to Discharge: Continued Medical Work up  Expected Discharge Plan and Services Expected Discharge Plan:  (LTC)     Post Acute Care Choice: [place] arrangements for the past 2 months: [place]                                       Social Determinants of Health (SDOH) Interventions    Readmission Risk Interventions No flowsheet data found.

## 2021-01-07 NOTE — Progress Notes (Addendum)
PROGRESS NOTE    [name]  MHD:[id number] DOB: 1945-10-09 DOA: 01/03/2021 PCP: Nolene Ebbs, MD   Brief Narrative:  [name] is a 75 y.o. female with medical history significant of COPD on 2L O2, lung cancer w/ mets to brain, HIV, HLD. Presented with generalized weakness and fever. She reports that she has not had much of an appetite in the last several days. She denies N/V. She has noted increased headache and fatigue. Her facility reports that she had fever up to 102 over the last several days. She's had O2 sats down into the 80's. She's required an increase of her chronic O2 support. They became concerned and sent her to the ED for eval.   ED Course: CXR was negative. CTH was concerning for new tumor. An MRI was obtained. She was noted to be in AKI. Fluids were started.   Assessment & Plan:   Active Problems:   AKI (acute kidney injury) ([place])  AKI/dehydration: Due to poor p.o. intake.  CT abdomen/pelvis unremarkable.  AKI resolved.    Abdominal pain/constipation: Fecal disimpaction done in the ED.  Continue MiraLAX and senna.  No more pain today.  Generalized weakness: Likely chronic due to multiple medical issues.  PT OT consulted. They recommended returning to SNF for long-term care.  History of prior right MCA stroke with left hemiparesis: Noted.  Fever/bacteremia: Last temperature spike of 101 around 9 PM on 01/05/2021. Initial 1 set of blood culture was drawn which was positive for Staph aureus which could be contamination since repeat blood culture from 01/04/2021 remains negative so far.  CT abdomen, chest x-ray as well as CT thoracolumbar spine all negative for any source of infection.  Her leukocytosis has also resolved.  UA not impressive enough and urine culture showing less than 100,000 colonies of procidentia stuarti.  Discussed with Dr. Graylon Good of ID, her fever could very well be coming from brain metastasis.  Since there is no source of infection, we are going  to give her a trial of off antibiotics and see how she does.  We will discontinue antibiotics and monitor for another 24 hours.  Acute metabolic encephalopathy: She was initially very lethargic and confused.  Now she is fully alert and oriented.  Sometimes she just does not like to talk and answer questions.   COPD/acute on chronic hypoxic respiratory failure: Uses 2 to 3 L of oxygen at home and she is at her baseline now.   HIV: continue home regimen/Biktarvy   Hx of PTE: Continue Eliquis.   Hx of lung cancer w/ brain mets, s/p brain tumor resection and radiation Headache     - CTH was concerning for possibility of a new tumor in the right frontoparietal lobe. An MRI was obtained showing an enhancement along that region.  Admitting hospitalist discussed the case with Dr. Lisbeth Renshaw and Dr. Mickeal Skinner.  According to them, her case will be brought before the tumor board. Dr. Mickeal Skinner recommends holding off on steroids for right now.   Goals of Care: Admitting hospitalist spoke with family/HCPOA; they have decided to go with FULL code: Palliative care consulted at admission but has not seen patient yet.  I have notified palliative care PA yesterday.  DVT prophylaxis:   Eliquis   Code Status: Full Code  Family Communication:  None present at bedside.    Status is: Inpatient  Remains inpatient appropriate because:Inpatient level of care appropriate due to severity of illness  Dispo: The patient is from: ALF  Anticipated d/c is to: ALF              Patient currently is not medically stable to d/c.   Difficult to place patient No   Estimated body mass index is 32.49 kg/m as calculated from the following:   Height as of this encounter: 5\' 9"  (1.753 m).   Weight as of this encounter: 99.8 kg.     Nutritional status:  Consultants:  None  Procedures:  None  Antimicrobials:  Anti-infectives (From admission, onward)    Start     Dose/Rate Route Frequency Ordered Stop   01/06/21  1000  vancomycin (VANCOREADY) IVPB 1250 mg/250 mL  Status:  Discontinued        1,250 mg 166.7 mL/hr over 90 Minutes Intravenous Every 24 hours 01/05/21 0923 01/07/21 1036   01/05/21 1000  vancomycin (VANCOREADY) IVPB 1750 mg/350 mL        1,750 mg 175 mL/hr over 120 Minutes Intravenous  Once 01/05/21 0912 01/05/21 1230   01/05/21 1000  ceFEPIme (MAXIPIME) 2 g in sodium chloride 0.9 % 100 mL IVPB  Status:  Discontinued        2 g 200 mL/hr over 30 Minutes Intravenous Every 8 hours 01/05/21 0913 01/07/21 1036   01/04/21 1000  bictegravir-emtricitabine-tenofovir AF (BIKTARVY) 50-200-25 MG per tablet 1 tablet        1 tablet Oral Daily 01/03/21 1830            Subjective: Patient seen and examined.  Sleepy but easily arousable.  Complains of pain all over the body which is usual for her.  Keeps her eyes closed and does not wish to talk much today.  Although, she seems to be oriented.  Objective: Vitals:   01/06/21 1949 01/06/21 2140 01/07/21 0450 01/07/21 0826  BP:  130/80 123/62   Pulse:  92 81   Resp:  18 18   Temp:  98.2 F (36.8 C) 98 F (36.7 C)   TempSrc:  Oral Oral   SpO2: 98% 97% 99% 97%  Weight:      Height:        Intake/Output Summary (Last 24 hours) at 01/07/2021 1047 Last data filed at 01/07/2021 0450 Gross per 24 hour  Intake 1331.36 ml  Output 2000 ml  Net -668.64 ml    Filed Weights   01/03/21 0825  Weight: 99.8 kg    Examination:  General exam: Appears calm and comfortable but wants to sleep Respiratory system: Clear to auscultation with slightly diminished breath sounds due to poor inspiratory effort.  Cardiovascular system: S1 & S2 heard, RRR. No JVD, murmurs, rubs, gallops or clicks. No pedal edema. Gastrointestinal system: Abdomen is nondistended, soft and nontender. No organomegaly or masses felt. Normal bowel sounds heard. Central nervous system: Sleepy but oriented.  Left hemiparesis  Data Reviewed: I have personally reviewed following labs  and imaging studies  CBC: Recent Labs  Lab 01/03/21 1028 01/04/21 0022 01/05/21 0339 01/06/21 0353 01/07/21 0440  WBC 14.2* 12.7* 12.8* 10.3 11.3*  NEUTROABS 11.6* 11.4* 10.2* 7.9* 8.4*  HGB 10.8* 9.5* 9.3* 8.8* 9.8*  HCT 36.1 30.8* 29.4* 29.5* 30.6*  MCV 88.9 88.3 87.5 88.9 84.8  PLT 237 218 202 243 448    Basic Metabolic Panel: Recent Labs  Lab 01/03/21 1028 01/04/21 0148 01/05/21 0339 01/06/21 0353  NA 139 140 140 136  K 3.6 3.9 4.5 3.7  CL 100 102 104 102  CO2 27 26 28 24   GLUCOSE 106* 138* 97  96  BUN 36* 30* 25* 13  CREATININE 3.26* 1.70* 1.00 0.86  CALCIUM 8.9 8.8* 8.6* 8.2*    GFR: Estimated Creatinine Clearance: 73.2 mL/min (by C-G formula based on SCr of 0.86 mg/dL). Liver Function Tests: Recent Labs  Lab 01/03/21 1028 01/04/21 0148  AST 24 24  ALT 21 21  ALKPHOS 68 72  BILITOT 0.8 0.8  PROT 8.9* 8.2*  ALBUMIN 3.7 3.3*    Recent Labs  Lab 01/03/21 1028  LIPASE 21    No results for input(s): AMMONIA in the last 168 hours. Coagulation Profile: Recent Labs  Lab 01/03/21 1028  INR 1.6*    Cardiac Enzymes: No results for input(s): CKTOTAL, CKMB, CKMBINDEX, TROPONINI in the last 168 hours. BNP (last 3 results) No results for input(s): PROBNP in the last 8760 hours. HbA1C: No results for input(s): HGBA1C in the last 72 hours. CBG: No results for input(s): GLUCAP in the last 168 hours. Lipid Profile: No results for input(s): CHOL, HDL, LDLCALC, TRIG, CHOLHDL, LDLDIRECT in the last 72 hours. Thyroid Function Tests: No results for input(s): TSH, T4TOTAL, FREET4, T3FREE, THYROIDAB in the last 72 hours. Anemia Panel: No results for input(s): VITAMINB12, FOLATE, FERRITIN, TIBC, IRON, RETICCTPCT in the last 72 hours. Sepsis Labs: Recent Labs  Lab 01/03/21 1028  LATICACIDVEN 1.0     Recent Results (from the past 240 hour(s))  Resp Panel by RT-PCR (Flu A&B, Covid) Nasopharyngeal Swab     Status: None   Collection Time: 01/03/21  9:32 AM    Specimen: Nasopharyngeal Swab; Nasopharyngeal(NP) swabs in vial transport medium  Result Value Ref Range Status   SARS Coronavirus 2 by RT PCR NEGATIVE NEGATIVE Final    Comment: (NOTE) SARS-CoV-2 target nucleic acids are NOT DETECTED.  The SARS-CoV-2 RNA is generally detectable in upper respiratory specimens during the acute phase of infection. The lowest concentration of SARS-CoV-2 viral copies this assay can detect is 138 copies/mL. A negative result does not preclude SARS-Cov-2 infection and should not be used as the sole basis for treatment or other patient management decisions. A negative result may occur with  improper specimen collection/handling, submission of specimen other than nasopharyngeal swab, presence of viral mutation(s) within the areas targeted by this assay, and inadequate number of viral copies(<138 copies/mL). A negative result must be combined with clinical observations, patient history, and epidemiological information. The expected result is Negative.  Fact Sheet for Patients:  EntrepreneurPulse.com.au  Fact Sheet for Healthcare Providers:  IncredibleEmployment.be  This test is no t yet approved or cleared by the [place] FDA and  has been authorized for detection and/or diagnosis of SARS-CoV-2 by FDA under an Emergency Use Authorization (EUA). This EUA will remain  in effect (meaning this test can be used) for the duration of the COVID-19 declaration under Section 564(b)(1) of the Act, 21 U.S.C.section 360bbb-3(b)(1), unless the authorization is terminated  or revoked sooner.       Influenza A by PCR NEGATIVE NEGATIVE Final   Influenza B by PCR NEGATIVE NEGATIVE Final    Comment: (NOTE) The Xpert Xpress SARS-CoV-2/FLU/RSV plus assay is intended as an aid in the diagnosis of influenza from Nasopharyngeal swab specimens and should not be used as a sole basis for treatment. Nasal washings and aspirates are  unacceptable for Xpert Xpress SARS-CoV-2/FLU/RSV testing.  Fact Sheet for Patients: EntrepreneurPulse.com.au  Fact Sheet for Healthcare Providers: IncredibleEmployment.be  This test is not yet approved or cleared by the [place] FDA and has been authorized for detection and/or  diagnosis of SARS-CoV-2 by FDA under an Emergency Use Authorization (EUA). This EUA will remain in effect (meaning this test can be used) for the duration of the COVID-19 declaration under Section 564(b)(1) of the Act, 21 U.S.C. section 360bbb-3(b)(1), unless the authorization is terminated or revoked.  Performed at [facility], [place] [address], [place], [place] [address]   Group A Strep by PCR     Status: None   Collection Time: 01/03/21  9:33 AM   Specimen: Throat; Sterile Swab  Result Value Ref Range Status   Group A Strep by PCR NOT DETECTED NOT DETECTED Final    Comment: Performed at [facility], [place] [address], [place], [place] [address]  Blood Culture (routine x 2)     Status: Abnormal   Collection Time: 01/03/21 10:28 AM   Specimen: BLOOD RIGHT ARM  Result Value Ref Range Status   Specimen Description   Final    BLOOD RIGHT ARM Performed at [facility] [address], [place], [place] [address]    Special Requests   Final    BOTTLES DRAWN AEROBIC AND ANAEROBIC Blood Culture results may not be optimal due to an inadequate volume of blood received in culture bottles Performed at [facility] [address], [place], [place] [address]    Culture  Setup Time   Final    GRAM POSITIVE COCCI IN CLUSTERS AEROBIC BOTTLE ONLY CRITICAL RESULT CALLED TO, READ BACK BY AND VERIFIED WITH: JUSTIN LEGGE PHARMD @0822  01/04/21 EB    Culture (A)  Final    STAPHYLOCOCCUS HOMINIS THE SIGNIFICANCE OF ISOLATING THIS ORGANISM FROM A SINGLE VENIPUNCTURE CANNOT BE PREDICTED WITHOUT FURTHER CLINICAL  AND CULTURE CORRELATION. SUSCEPTIBILITIES AVAILABLE ONLY ON REQUEST. Performed at [facility], [place] [address], [place], [place] [address]    Report Status 01/06/2021 FINAL  Final  Blood Culture ID Panel (Reflexed)     Status: Abnormal   Collection Time: 01/03/21 10:28 AM  Result Value Ref Range Status   Enterococcus faecalis NOT DETECTED NOT DETECTED Final   Enterococcus Faecium NOT DETECTED NOT DETECTED Final   Listeria monocytogenes NOT DETECTED NOT DETECTED Final   Staphylococcus species DETECTED (A) NOT DETECTED Final    Comment: CRITICAL RESULT CALLED TO, READ BACK BY AND VERIFIED WITH: JUSTIN LEGGE PHARMD @0822  01/04/21 EB    Staphylococcus aureus (BCID) NOT DETECTED NOT DETECTED Final   Staphylococcus epidermidis NOT DETECTED NOT DETECTED Final   Staphylococcus lugdunensis NOT DETECTED NOT DETECTED Final   Streptococcus species NOT DETECTED NOT DETECTED Final   Streptococcus agalactiae NOT DETECTED NOT DETECTED Final   Streptococcus pneumoniae NOT DETECTED NOT DETECTED Final   Streptococcus pyogenes NOT DETECTED NOT DETECTED Final   A.calcoaceticus-baumannii NOT DETECTED NOT DETECTED Final   Bacteroides fragilis NOT DETECTED NOT DETECTED Final   Enterobacterales NOT DETECTED NOT DETECTED Final   Enterobacter cloacae complex NOT DETECTED NOT DETECTED Final   Escherichia coli NOT DETECTED NOT DETECTED Final   Klebsiella aerogenes NOT DETECTED NOT DETECTED Final   Klebsiella oxytoca NOT DETECTED NOT DETECTED Final   Klebsiella pneumoniae NOT DETECTED NOT DETECTED Final   Proteus species NOT DETECTED NOT DETECTED Final   Salmonella species NOT DETECTED NOT DETECTED Final   Serratia marcescens NOT DETECTED NOT DETECTED Final   Haemophilus influenzae NOT DETECTED NOT DETECTED Final   Neisseria meningitidis NOT DETECTED NOT DETECTED Final   Pseudomonas aeruginosa NOT DETECTED NOT DETECTED Final   Stenotrophomonas maltophilia NOT DETECTED  NOT DETECTED Final   Candida albicans  NOT DETECTED NOT DETECTED Final   Candida auris NOT DETECTED NOT DETECTED Final   Candida glabrata NOT DETECTED NOT DETECTED Final   Candida krusei NOT DETECTED NOT DETECTED Final   Candida parapsilosis NOT DETECTED NOT DETECTED Final   Candida tropicalis NOT DETECTED NOT DETECTED Final   Cryptococcus neoformans/gattii NOT DETECTED NOT DETECTED Final    Comment: Performed at [facility], [place] [address] [place], [place] [address]  Culture, Urine     Status: Abnormal   Collection Time: 01/03/21  4:43 PM   Specimen: Urine, Catheterized  Result Value Ref Range Status   Specimen Description   Final    URINE, CATHETERIZED Performed at [facility] [address], [place], [place] [address]    Special Requests   Final    NONE Performed at [facility], [place], [place] [address] [place], [place] [address]    Culture 70,000 COLONIES/mL PROVIDENCIA STUARTII (A)  Final   Report Status 01/06/2021 FINAL  Final   Organism ID, Bacteria PROVIDENCIA STUARTII (A)  Final      Susceptibility   Providencia stuartii - MIC*    AMPICILLIN RESISTANT Resistant     CEFAZOLIN >=64 RESISTANT Resistant     CEFEPIME <=0.12 SENSITIVE Sensitive     CEFTRIAXONE <=0.25 SENSITIVE Sensitive     CIPROFLOXACIN <=0.25 SENSITIVE Sensitive     GENTAMICIN RESISTANT Resistant     IMIPENEM 2 SENSITIVE Sensitive     NITROFURANTOIN 128 RESISTANT Resistant     TRIMETH/SULFA <=20 SENSITIVE Sensitive     AMPICILLIN/SULBACTAM 16 INTERMEDIATE Intermediate     PIP/TAZO <=4 SENSITIVE Sensitive     * 70,000 COLONIES/mL PROVIDENCIA STUARTII  Culture, blood (routine x 2)     Status: None (Preliminary result)   Collection Time: 01/04/21  8:56 AM   Specimen: BLOOD RIGHT HAND  Result Value Ref Range Status   Specimen Description   Final    BLOOD RIGHT HAND Performed at [facility], [place] [address], [place], [place] [address]    Special Requests   Final    BOTTLES DRAWN  AEROBIC ONLY Blood Culture adequate volume Performed at [facility] [address] [place], [place] [address]    Culture   Final    NO GROWTH 3 DAYS Performed at [facility], [place] [address] [place], [place] [address]    Report Status PENDING  Incomplete  Culture, blood (routine x 2)     Status: None (Preliminary result)   Collection Time: 01/04/21  9:29 AM   Specimen: BLOOD RIGHT HAND  Result Value Ref Range Status   Specimen Description   Final    BLOOD RIGHT HAND Performed at [facility] [address], [place], [place] [address]    Special Requests   Final    BOTTLES DRAWN AEROBIC ONLY Blood Culture results may not be optimal due to an inadequate volume of blood received in culture bottles Performed at [facility] [address], [place], [place] [address]    Culture   Final    NO GROWTH 3 DAYS Performed at [facility], [place] [address], [place], [place] [address]    Report Status PENDING  Incomplete  Respiratory (~20 pathogens) panel by PCR     Status: None   Collection Time: 01/04/21 10:03 AM   Specimen: Nasopharyngeal Swab; Respiratory  Result Value Ref Range Status  Adenovirus NOT DETECTED NOT DETECTED Final   Coronavirus 229E NOT DETECTED NOT DETECTED Final    Comment: (NOTE) The Coronavirus on the Respiratory Panel, DOES NOT test for the novel  Coronavirus (2019 nCoV)    Coronavirus HKU1 NOT DETECTED NOT DETECTED Final   Coronavirus NL63 NOT DETECTED NOT DETECTED Final   Coronavirus OC43 NOT DETECTED NOT DETECTED Final   Metapneumovirus NOT DETECTED NOT DETECTED Final   Rhinovirus / Enterovirus NOT DETECTED NOT DETECTED Final   Influenza A NOT DETECTED NOT DETECTED Final   Influenza B NOT DETECTED NOT DETECTED Final   Parainfluenza Virus 1 NOT DETECTED NOT DETECTED Final   Parainfluenza Virus 2 NOT DETECTED NOT DETECTED Final   Parainfluenza Virus 3 NOT DETECTED NOT DETECTED Final    Parainfluenza Virus 4 NOT DETECTED NOT DETECTED Final   Respiratory Syncytial Virus NOT DETECTED NOT DETECTED Final   Bordetella pertussis NOT DETECTED NOT DETECTED Final   Bordetella Parapertussis NOT DETECTED NOT DETECTED Final   Chlamydophila pneumoniae NOT DETECTED NOT DETECTED Final   Mycoplasma pneumoniae NOT DETECTED NOT DETECTED Final    Comment: Performed at [facility], [place] [address] [place], [place] [address]       Radiology Studies: CT THORACIC SPINE W CONTRAST  Result Date: 01/06/2021 CLINICAL DATA:  Weakness. Back pain. Fever. History of metastatic lung cancer. EXAM: CT THORACIC AND LUMBAR SPINE WITHOUT CONTRAST TECHNIQUE: Multidetector CT imaging of the thoracic and lumbar spine was performed without contrast. Multiplanar CT image reconstructions were also generated. COMPARISON:  Radiography 07/19/2014 FINDINGS: CT THORACIC SPINE FINDINGS Alignment: Mild thoracic region scoliotic curvature. Vertebrae: No fracture or lytic destructive bone lesion. Paraspinal and other soft tissues: Mild dependent atelectasis or scarring in the lungs. Mass in the posterior left mid lung, not primarily or completely evaluated. Disc levels: No significant disc space finding. No apparent stenosis of the cord or foramina. CT LUMBAR SPINE FINDINGS Segmentation: 5 lumbar type vertebral bodies. Alignment: 1 mm of degenerative anterolisthesis at L4-5. Vertebrae: No fracture or lytic destructive lesion. Paraspinal and other soft tissues: No acute finding. See results of recent abdominal CT. Disc levels: No one 2, L2-3 and L3-4: Disc bulges. Facet and ligamentous hypertrophy. Mild multifactorial stenosis at L3-4 but without likely neural compression. L4-5: Chronic facet degeneration and hypertrophy. 1 mm of anterolisthesis. Bulging of the disc. Multifactorial spinal stenosis at this level that could be symptomatic. L5-S1: Bulging of the disc. Facet and ligamentous hypertrophy. Stenosis of the subarticular  lateral recesses and foramina, left more than right. Neural compression could possibly occur, particularly on the left IMPRESSION: CT THORACIC SPINE IMPRESSION Negative study. No evidence of metastatic disease or fracture. No apparent stenosis. CT LUMBAR SPINE IMPRESSION No metastatic disease or fracture. Lower lumbar degenerative changes. Multifactorial stenosis at L4-5 that could cause neural compression. Left more than right lateral recess and foraminal stenosis at L5-S1 could cause neural compression, particularly on the left. Mild multifactorial stenosis at L3-4. Electronically Signed   By: Nelson Chimes M.D.   On: 01/06/2021 15:03   CT LUMBAR SPINE W CONTRAST  Result Date: 01/06/2021 CLINICAL DATA:  Weakness. Back pain. Fever. History of metastatic lung cancer. EXAM: CT THORACIC AND LUMBAR SPINE WITHOUT CONTRAST TECHNIQUE: Multidetector CT imaging of the thoracic and lumbar spine was performed without contrast. Multiplanar CT image reconstructions were also generated. COMPARISON:  Radiography 07/19/2014 FINDINGS: CT THORACIC SPINE FINDINGS Alignment: Mild thoracic region scoliotic curvature. Vertebrae: No fracture or lytic destructive bone lesion. Paraspinal and other soft tissues:  Mild dependent atelectasis or scarring in the lungs. Mass in the posterior left mid lung, not primarily or completely evaluated. Disc levels: No significant disc space finding. No apparent stenosis of the cord or foramina. CT LUMBAR SPINE FINDINGS Segmentation: 5 lumbar type vertebral bodies. Alignment: 1 mm of degenerative anterolisthesis at L4-5. Vertebrae: No fracture or lytic destructive lesion. Paraspinal and other soft tissues: No acute finding. See results of recent abdominal CT. Disc levels: No one 2, L2-3 and L3-4: Disc bulges. Facet and ligamentous hypertrophy. Mild multifactorial stenosis at L3-4 but without likely neural compression. L4-5: Chronic facet degeneration and hypertrophy. 1 mm of anterolisthesis. Bulging of  the disc. Multifactorial spinal stenosis at this level that could be symptomatic. L5-S1: Bulging of the disc. Facet and ligamentous hypertrophy. Stenosis of the subarticular lateral recesses and foramina, left more than right. Neural compression could possibly occur, particularly on the left IMPRESSION: CT THORACIC SPINE IMPRESSION Negative study. No evidence of metastatic disease or fracture. No apparent stenosis. CT LUMBAR SPINE IMPRESSION No metastatic disease or fracture. Lower lumbar degenerative changes. Multifactorial stenosis at L4-5 that could cause neural compression. Left more than right lateral recess and foraminal stenosis at L5-S1 could cause neural compression, particularly on the left. Mild multifactorial stenosis at L3-4. Electronically Signed   By: Nelson Chimes M.D.   On: 01/06/2021 15:03    Scheduled Meds:  apixaban  5 mg Oral BID   bictegravir-emtricitabine-tenofovir AF  1 tablet Oral Daily   busPIRone  2.5 mg Oral TID   guaiFENesin  600 mg Oral BID   ipratropium-albuterol  3 mL Nebulization BID   lidocaine  1 patch Transdermal Daily   mometasone-formoterol  2 puff Inhalation BID   montelukast  10 mg Oral QHS   polyethylene glycol  17 g Oral BID   QUEtiapine Fumarate  150 mg Oral QHS   rOPINIRole  1 mg Oral QHS   senna-docusate  1 tablet Oral QHS   venlafaxine XR  150 mg Oral Daily   Continuous Infusions:    LOS: 4 days   Time spent: 29 minutes   Darliss [place], MD [facility]  01/07/2021, 10:47 AM   How to contact the [facility] Attending or Consulting provider [place] or covering provider during after hours [place], for this patient?  Check the care team in [facility] and look for a) attending/consulting TR[facility] provider listed and b) the [facility] team listed. Page or secure chat [phone]. Log into [email or web address] and use 's universal password to access. If you do not have the password, please contact the hospital operator. Locate the [facility] provider you are looking for under  [facility] and page to a number that you can be directly reached. If you still have difficulty reaching the provider, please page the [facility] (Director on Call) for the Hospitalists listed on amion for assistance.

## 2021-01-07 NOTE — Consult Note (Signed)
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 She was admitted on 7/8 with AMS and hypoxemia. She has progression of lung cancer on imaging including brain metastasis. She has also had persistent fevers with unclear source of infection. Her baseline functional status is very poor. She has lower extremity contractures and is mostly confined to a bed or recliner chair most of her days requiring total support for all ADLs. Her last brain radiation was done 06/2020.No plans for systemic treatment were explored.  She is a resident in [place] at [facility] and has been followed by [place]. She has an existing DNR order that she consented to when she had decision making capacity and that was changed on this admission. She saw Dr. Mickeal Skinner about 4 months ago for disease progression and at that time her condition was so debilitated that further imaging and treatment of her cancer was not felt to be a good option for her.  [name] is one of of 11 siblings.  5 are left.  [name] is her brother and has been the default [facility] decision maker and there is no documented HCPOA. I have attempted to call Michelle to discuss her condition and goals of care- phone goes to voicemail.  Recommendations: DNR, already established Will continue to try to reach Michelle her brother during admission I strongly recommend that Miu be transitioned into full hospice care and receive services at [facility] vs. [facility]-if we are unable to make that determination or referral while she is  inpatient I will request that the facility palliative care provider assist with that transition. Proceed with trial of oral antibiotics and medical treatment that is non-invasive for time limited trials as determined by primary  service.  Lane Hacker, DO Palliative Medicine  Time: 70 minutes Greater than 50%  of this time was spent counseling and coordinating care related to the above assessment and plan.

## 2021-01-07 NOTE — Progress Notes (Signed)
Manufacturing [employer]  Spoke with TOC about Ms [name]. She is our current Palliative care patient. Plan is to discharge back to [facility] with palliative and transition over to hospice care. [place] referral center and Palliative team aware.   Please call with any hospice related questions.   Thank you, Clementeen Hoof, RN, BSN [facility] Liaison [phone]

## 2021-01-08 DIAGNOSIS — G9341 Metabolic encephalopathy: Secondary | ICD-10-CM

## 2021-01-08 LAB — CBC WITH DIFFERENTIAL/PLATELET
Abs Immature Granulocytes: 0.13 10*3/uL — ABNORMAL HIGH (ref 0.00–0.07)
Basophils Absolute: 0 10*3/uL (ref 0.0–0.1)
Basophils Relative: 0 %
Eosinophils Absolute: 0.2 10*3/uL (ref 0.0–0.5)
Eosinophils Relative: 2 %
HCT: 29 % — ABNORMAL LOW (ref 36.0–46.0)
Hemoglobin: 8.9 g/dL — ABNORMAL LOW (ref 12.0–15.0)
Immature Granulocytes: 1 %
Lymphocytes Relative: 18 %
Lymphs Abs: 1.7 10*3/uL (ref 0.7–4.0)
MCH: 27.1 pg (ref 26.0–34.0)
MCHC: 30.7 g/dL (ref 30.0–36.0)
MCV: 88.1 fL (ref 80.0–100.0)
Monocytes Absolute: 0.6 10*3/uL (ref 0.1–1.0)
Monocytes Relative: 6 %
Neutro Abs: 6.8 10*3/uL (ref 1.7–7.7)
Neutrophils Relative %: 73 %
Platelets: 267 10*3/uL (ref 150–400)
RBC: 3.29 MIL/uL — ABNORMAL LOW (ref 3.87–5.11)
RDW: 15.9 % — ABNORMAL HIGH (ref 11.5–15.5)
WBC: 9.4 10*3/uL (ref 4.0–10.5)
nRBC: 0 % (ref 0.0–0.2)

## 2021-01-08 NOTE — Plan of Care (Signed)
  Problem: Health Behavior/Discharge Planning: Goal: Ability to manage health-related needs will improve Outcome: Progressing   Problem: Clinical Measurements: Goal: Diagnostic test results will improve Outcome: Progressing   Problem: Activity: Goal: Risk for activity intolerance will decrease Outcome: Progressing   Problem: Nutrition: Goal: Adequate nutrition will be maintained Outcome: Progressing   

## 2021-01-08 NOTE — Progress Notes (Signed)
OT Cancellation Note  Patient Details Name: [name] MRN: [id number] DOB: 10/24/1945   Cancelled Treatment:    Reason Eval/Treat Not Completed: Other (comment). Patient is a long term SNF resident that is predominantly bed bound and total care. Palliative care is recommending full Hospice care. Patient exhibits no rehab potential. OT will sign off.  Cairo Lingenfelter L Leilanee Righetti 01/08/2021, 8:10 AM

## 2021-01-08 NOTE — Progress Notes (Signed)
   01/08/21 0533  Provider Notification  Provider Name/Title J. Olena Heckle, NP  Date Provider Notified 01/08/21  Time Provider Notified [phone]  Notification Type Page  Notification Reason Other (Comment) (Pt found with some clumps of hair in bed, then noted to have a bloody spot in her head. A moderate amount of  blood had ran down back of head onto pillow. Spot in head looks like it has been there for a little while as it feels like a scab.)

## 2021-01-08 NOTE — Progress Notes (Signed)
    BRIEF OVERNIGHT PROGRESS REPORT  Notified by RN for blood found on patients pillow with hair embedded. Pt did not fall as she is not capable of a significant amount of movement. She can move her right upper extremity and there is a small amount of blood in her fingernails.  The spot in question is on the right front of her head and appears as matted hair in a scab.  She is on anticoagulant therapy She is not apparently bleeding currently  It appears that she may have scratched or picked a place on her head causing the blood and matted area as this matches blood found in her fingernails. Vitals are stable and she is in no obvious or stated distress.  Gershon Cull MSNA ACNPC-AG Acute Care Nurse Practitioner [name]

## 2021-01-08 NOTE — Progress Notes (Signed)
PROGRESS NOTE    [name]  TGG:[id number] DOB: Feb 19, 1946 DOA: 01/03/2021 PCP: Nolene Ebbs, MD   Brief Narrative:  [name] is a 75 y.o. female with medical history significant of COPD on 2L O2, lung cancer w/ mets to brain, HIV, HLD. Presented with generalized weakness and fever. She reports that she has not had much of an appetite in the last several days. She denies N/V. She has noted increased headache and fatigue. Her facility reports that she had fever up to 102 over the last several days. She's had O2 sats down into the 80's. She's required an increase of her chronic O2 support. They became concerned and sent her to the ED for eval.   ED Course: CXR was negative. CTH was concerning for new tumor. An MRI was obtained. She was noted to be in AKI. Fluids were started.   Assessment & Plan:   Active Problems:   HIV (human immunodeficiency virus infection) ([place])   Chronic obstructive pulmonary disease with (acute) exacerbation (HCC)   Brain metastasis (HCC)   AKI (acute kidney injury) ([place])   Acute metabolic encephalopathy   Chronic respiratory failure with hypoxia (HCC)  AKI/dehydration: Due to poor p.o. intake.  CT abdomen/pelvis unremarkable.  AKI resolved.    Abdominal pain/constipation: Fecal disimpaction done in the ED.  Continue MiraLAX and senna.  No more pain today.  Generalized weakness: Likely chronic due to multiple medical issues.  PT OT consulted. They recommended returning to SNF for long-term care.  History of prior right MCA stroke with left hemiparesis: Noted.  Fever/bacteremia: Last temperature spike of 101 around 9 PM on 01/05/2021. Initial 1 set of blood culture was drawn which was positive for Staph aureus which could be contamination since repeat blood culture from 01/04/2021 remains negative so far.  CT abdomen, chest x-ray as well as CT thoracolumbar spine all negative for any source of infection.  Her leukocytosis has also resolved.  UA not  impressive enough and urine culture showing less than 100,000 colonies of procidentia stuarti.  On 01/07/2021, discussed with Dr. Graylon Good of ID, her fever could very well be coming from brain metastasis.  Since there is no source of infection, we decided to give her a trial of being off of antibiotics.  She has remained afebrile for about 24 hours.  Since she had intermittent fever for prolonged period of time, would favor watching another 24 hours and if no fever, she can be discharged safely.  Acute metabolic encephalopathy: She was initially very lethargic and confused.  Now she is fully alert and oriented.  Sometimes she just does not like to talk and answer questions.   COPD/acute on chronic hypoxic respiratory failure: Uses 2 to 3 L of oxygen at home and she is at her baseline now.   HIV: continue home regimen/Biktarvy   Hx of PTE: Continue Eliquis.   Hx of lung cancer w/ brain mets, s/p brain tumor resection and radiation Headache     - CTH was concerning for possibility of a new tumor in the right frontoparietal lobe. An MRI was obtained showing an enhancement along that region.  Admitting hospitalist discussed the case with Dr. Lisbeth Renshaw and Dr. Mickeal Skinner.  According to them, her case will be brought before the tumor board. Dr. Mickeal Skinner recommends holding off on steroids for right now.   Goals of Care: Seen by palliative care.  Apparently she is very well-known to them.  Their help is very appreciated.  Patient has chosen  to be DNR in the past when she was having full capacity to make decisions.  Based off of that, palliative care has switched her back to DNR.  They have also recommended hospice.  TOC is on board and her palliative care outpatient at facility will transition her to hospice.  DVT prophylaxis:   Eliquis   Code Status: DNR  Family Communication:  None present at bedside.    Status is: Inpatient  Remains inpatient appropriate because:Inpatient level of care appropriate due to  severity of illness  Dispo: The patient is from: ALF              Anticipated d/c is to: ALF              Patient currently is not medically stable to d/c.   Difficult to place patient No   Estimated body mass index is 32.49 kg/m as calculated from the following:   Height as of this encounter: 5\' 9"  (1.753 m).   Weight as of this encounter: 99.8 kg.     Nutritional status:  Consultants:  Palliative care  Procedures:  None  Antimicrobials:  Anti-infectives (From admission, onward)    Start     Dose/Rate Route Frequency Ordered Stop   01/06/21 1000  vancomycin (VANCOREADY) IVPB 1250 mg/250 mL  Status:  Discontinued        1,250 mg 166.7 mL/hr over 90 Minutes Intravenous Every 24 hours 01/05/21 0923 01/07/21 1036   01/05/21 1000  vancomycin (VANCOREADY) IVPB 1750 mg/350 mL        1,750 mg 175 mL/hr over 120 Minutes Intravenous  Once 01/05/21 0912 01/05/21 1230   01/05/21 1000  ceFEPIme (MAXIPIME) 2 g in sodium chloride 0.9 % 100 mL IVPB  Status:  Discontinued        2 g 200 mL/hr over 30 Minutes Intravenous Every 8 hours 01/05/21 0913 01/07/21 1036   01/04/21 1000  bictegravir-emtricitabine-tenofovir AF (BIKTARVY) 50-200-25 MG per tablet 1 tablet        1 tablet Oral Daily 01/03/21 1830            Subjective: Seen and examined.  Once again complains of back pain.  Once again we will keep her eyes closed during the conversation and will have minimal answers from them.  Will moan to all sort of touch/palpation of the abdomen.  Objective: Vitals:   01/07/21 1931 01/07/21 2142 01/08/21 0455 01/08/21 0753  BP:  125/66 108/69   Pulse:  87 85   Resp:  20 18   Temp:  99 F (37.2 C) 98.2 F (36.8 C)   TempSrc:  Oral Oral   SpO2: 95% 91% 93% 95%  Weight:      Height:        Intake/Output Summary (Last 24 hours) at 01/08/2021 1130 Last data filed at 01/08/2021 0736 Gross per 24 hour  Intake 360 ml  Output 1250 ml  Net -890 ml    Filed Weights   01/03/21 0825   Weight: 99.8 kg    Examination:  General exam: Appears calm and comfortable but would not talk much. Respiratory system: Clear to auscultation. Respiratory effort normal. Cardiovascular system: S1 & S2 heard, RRR. No JVD, murmurs, rubs, gallops or clicks. No pedal edema. Gastrointestinal system: Abdomen is nondistended, soft and generalized tenderness but likely chronic. No organomegaly or masses felt. Normal bowel sounds heard. Central nervous system: Alert and oriented.  Left hemiparesis    Data Reviewed: I have personally reviewed following labs  and imaging studies  CBC: Recent Labs  Lab 01/04/21 0022 01/05/21 0339 01/06/21 0353 01/07/21 0440 01/08/21 0451  WBC 12.7* 12.8* 10.3 11.3* 9.4  NEUTROABS 11.4* 10.2* 7.9* 8.4* 6.8  HGB 9.5* 9.3* 8.8* 9.8* 8.9*  HCT 30.8* 29.4* 29.5* 30.6* 29.0*  MCV 88.3 87.5 88.9 84.8 88.1  PLT 218 202 243 245 528    Basic Metabolic Panel: Recent Labs  Lab 01/03/21 1028 01/04/21 0148 01/05/21 0339 01/06/21 0353  NA 139 140 140 136  K 3.6 3.9 4.5 3.7  CL 100 102 104 102  CO2 27 26 28 24   GLUCOSE 106* 138* 97 96  BUN 36* 30* 25* 13  CREATININE 3.26* 1.70* 1.00 0.86  CALCIUM 8.9 8.8* 8.6* 8.2*    GFR: Estimated Creatinine Clearance: 73.2 mL/min (by C-G formula based on SCr of 0.86 mg/dL). Liver Function Tests: Recent Labs  Lab 01/03/21 1028 01/04/21 0148  AST 24 24  ALT 21 21  ALKPHOS 68 72  BILITOT 0.8 0.8  PROT 8.9* 8.2*  ALBUMIN 3.7 3.3*    Recent Labs  Lab 01/03/21 1028  LIPASE 21    No results for input(s): AMMONIA in the last 168 hours. Coagulation Profile: Recent Labs  Lab 01/03/21 1028  INR 1.6*    Cardiac Enzymes: No results for input(s): CKTOTAL, CKMB, CKMBINDEX, TROPONINI in the last 168 hours. BNP (last 3 results) No results for input(s): PROBNP in the last 8760 hours. HbA1C: No results for input(s): HGBA1C in the last 72 hours. CBG: No results for input(s): GLUCAP in the last 168  hours. Lipid Profile: No results for input(s): CHOL, HDL, LDLCALC, TRIG, CHOLHDL, LDLDIRECT in the last 72 hours. Thyroid Function Tests: No results for input(s): TSH, T4TOTAL, FREET4, T3FREE, THYROIDAB in the last 72 hours. Anemia Panel: No results for input(s): VITAMINB12, FOLATE, FERRITIN, TIBC, IRON, RETICCTPCT in the last 72 hours. Sepsis Labs: Recent Labs  Lab 01/03/21 1028  LATICACIDVEN 1.0     Recent Results (from the past 240 hour(s))  Resp Panel by RT-PCR (Flu A&B, Covid) Nasopharyngeal Swab     Status: None   Collection Time: 01/03/21  9:32 AM   Specimen: Nasopharyngeal Swab; Nasopharyngeal(NP) swabs in vial transport medium  Result Value Ref Range Status   SARS Coronavirus 2 by RT PCR NEGATIVE NEGATIVE Final    Comment: (NOTE) SARS-CoV-2 target nucleic acids are NOT DETECTED.  The SARS-CoV-2 RNA is generally detectable in upper respiratory specimens during the acute phase of infection. The lowest concentration of SARS-CoV-2 viral copies this assay can detect is 138 copies/mL. A negative result does not preclude SARS-Cov-2 infection and should not be used as the sole basis for treatment or other patient management decisions. A negative result may occur with  improper specimen collection/handling, submission of specimen other than nasopharyngeal swab, presence of viral mutation(s) within the areas targeted by this assay, and inadequate number of viral copies(<138 copies/mL). A negative result must be combined with clinical observations, patient history, and epidemiological information. The expected result is Negative.  Fact Sheet for Patients:  EntrepreneurPulse.com.au  Fact Sheet for Healthcare Providers:  IncredibleEmployment.be  This test is no t yet approved or cleared by the [place] FDA and  has been authorized for detection and/or diagnosis of SARS-CoV-2 by FDA under an Emergency Use Authorization (EUA). This EUA  will remain  in effect (meaning this test can be used) for the duration of the COVID-19 declaration under Section 564(b)(1) of the Act, 21 U.S.C.section 360bbb-3(b)(1), unless the authorization is  terminated  or revoked sooner.       Influenza A by PCR NEGATIVE NEGATIVE Final   Influenza B by PCR NEGATIVE NEGATIVE Final    Comment: (NOTE) The Xpert Xpress SARS-CoV-2/FLU/RSV plus assay is intended as an aid in the diagnosis of influenza from Nasopharyngeal swab specimens and should not be used as a sole basis for treatment. Nasal washings and aspirates are unacceptable for Xpert Xpress SARS-CoV-2/FLU/RSV testing.  Fact Sheet for Patients: EntrepreneurPulse.com.au  Fact Sheet for Healthcare Providers: IncredibleEmployment.be  This test is not yet approved or cleared by the [place] FDA and has been authorized for detection and/or diagnosis of SARS-CoV-2 by FDA under an Emergency Use Authorization (EUA). This EUA will remain in effect (meaning this test can be used) for the duration of the COVID-19 declaration under Section 564(b)(1) of the Act, 21 U.S.C. section 360bbb-3(b)(1), unless the authorization is terminated or revoked.  Performed at [facility], [place] [address] [place], [place] [address]   Group A Strep by PCR     Status: None   Collection Time: 01/03/21  9:33 AM   Specimen: Throat; Sterile Swab  Result Value Ref Range Status   Group A Strep by PCR NOT DETECTED NOT DETECTED Final    Comment: Performed at [facility], [place] [address] [place], [place] [address]  Blood Culture (routine x 2)     Status: Abnormal   Collection Time: 01/03/21 10:28 AM   Specimen: BLOOD RIGHT ARM  Result Value Ref Range Status   Specimen Description   Final    BLOOD RIGHT ARM Performed at [place] [address], [place], [place] [address]    Special Requests   Final    BOTTLES DRAWN  AEROBIC AND ANAEROBIC Blood Culture results may not be optimal due to an inadequate volume of blood received in culture bottles Performed at [place] [address] [place], [place] [address]    Culture  Setup Time   Final    GRAM POSITIVE COCCI IN CLUSTERS AEROBIC BOTTLE ONLY CRITICAL RESULT CALLED TO, READ BACK BY AND VERIFIED WITH: JUSTIN LEGGE PHARMD @0822  01/04/21 EB    Culture (A)  Final    STAPHYLOCOCCUS HOMINIS THE SIGNIFICANCE OF ISOLATING THIS ORGANISM FROM A SINGLE VENIPUNCTURE CANNOT BE PREDICTED WITHOUT FURTHER CLINICAL AND CULTURE CORRELATION. SUSCEPTIBILITIES AVAILABLE ONLY ON REQUEST. Performed at [facility], [place] [address], [place], [place] [address]    Report Status 01/06/2021 FINAL  Final  Blood Culture ID Panel (Reflexed)     Status: Abnormal   Collection Time: 01/03/21 10:28 AM  Result Value Ref Range Status   Enterococcus faecalis NOT DETECTED NOT DETECTED Final   Enterococcus Faecium NOT DETECTED NOT DETECTED Final   Listeria monocytogenes NOT DETECTED NOT DETECTED Final   Staphylococcus species DETECTED (A) NOT DETECTED Final    Comment: CRITICAL RESULT CALLED TO, READ BACK BY AND VERIFIED WITH: JUSTIN LEGGE PHARMD @0822  01/04/21 EB    Staphylococcus aureus (BCID) NOT DETECTED NOT DETECTED Final   Staphylococcus epidermidis NOT DETECTED NOT DETECTED Final   Staphylococcus lugdunensis NOT DETECTED NOT DETECTED Final   Streptococcus species NOT DETECTED NOT DETECTED Final   Streptococcus agalactiae NOT DETECTED NOT DETECTED Final   Streptococcus pneumoniae NOT DETECTED NOT DETECTED Final   Streptococcus pyogenes NOT DETECTED NOT DETECTED Final   A.calcoaceticus-baumannii NOT DETECTED NOT DETECTED Final   Bacteroides fragilis NOT DETECTED NOT DETECTED Final   Enterobacterales NOT DETECTED NOT  DETECTED Final   Enterobacter cloacae complex NOT DETECTED NOT DETECTED Final   Escherichia coli NOT DETECTED NOT DETECTED Final    Klebsiella aerogenes NOT DETECTED NOT DETECTED Final   Klebsiella oxytoca NOT DETECTED NOT DETECTED Final   Klebsiella pneumoniae NOT DETECTED NOT DETECTED Final   Proteus species NOT DETECTED NOT DETECTED Final   Salmonella species NOT DETECTED NOT DETECTED Final   Serratia marcescens NOT DETECTED NOT DETECTED Final   Haemophilus influenzae NOT DETECTED NOT DETECTED Final   Neisseria meningitidis NOT DETECTED NOT DETECTED Final   Pseudomonas aeruginosa NOT DETECTED NOT DETECTED Final   Stenotrophomonas maltophilia NOT DETECTED NOT DETECTED Final   Candida albicans NOT DETECTED NOT DETECTED Final   Candida auris NOT DETECTED NOT DETECTED Final   Candida glabrata NOT DETECTED NOT DETECTED Final   Candida krusei NOT DETECTED NOT DETECTED Final   Candida parapsilosis NOT DETECTED NOT DETECTED Final   Candida tropicalis NOT DETECTED NOT DETECTED Final   Cryptococcus neoformans/gattii NOT DETECTED NOT DETECTED Final    Comment: Performed at [facility], [place] [address], [place], [place] [address]  Culture, Urine     Status: Abnormal   Collection Time: 01/03/21  4:43 PM   Specimen: Urine, Catheterized  Result Value Ref Range Status   Specimen Description   Final    URINE, CATHETERIZED Performed at [place] [address] [place], [place] [address]    Special Requests   Final    NONE Performed at [facility], [place] [address] [place], [place] [address]    Culture 70,000 COLONIES/mL PROVIDENCIA STUARTII (A)  Final   Report Status 01/06/2021 FINAL  Final   Organism ID, Bacteria PROVIDENCIA STUARTII (A)  Final      Susceptibility   Providencia stuartii - MIC*    AMPICILLIN RESISTANT Resistant     CEFAZOLIN >=64 RESISTANT Resistant     CEFEPIME <=0.12 SENSITIVE Sensitive     CEFTRIAXONE <=0.25 SENSITIVE Sensitive     CIPROFLOXACIN <=0.25 SENSITIVE Sensitive     GENTAMICIN RESISTANT Resistant     IMIPENEM 2 SENSITIVE Sensitive      NITROFURANTOIN 128 RESISTANT Resistant     TRIMETH/SULFA <=20 SENSITIVE Sensitive     AMPICILLIN/SULBACTAM 16 INTERMEDIATE Intermediate     PIP/TAZO <=4 SENSITIVE Sensitive     * 70,000 COLONIES/mL PROVIDENCIA STUARTII  Culture, blood (routine x 2)     Status: None (Preliminary result)   Collection Time: 01/04/21  8:56 AM   Specimen: BLOOD RIGHT HAND  Result Value Ref Range Status   Specimen Description   Final    BLOOD RIGHT HAND Performed at [facility], [place] [address], [place], [place] [address]    Special Requests   Final    BOTTLES DRAWN AEROBIC ONLY Blood Culture adequate volume Performed at [place] [address], [place], [place] [address]    Culture   Final    NO GROWTH 4 DAYS Performed at [facility], [place] [address], [place], [place] [address]    Report Status PENDING  Incomplete  Culture, blood (routine x 2)     Status: None (Preliminary result)   Collection Time: 01/04/21  9:29 AM   Specimen: BLOOD RIGHT HAND  Result Value Ref Range Status   Specimen Description   Final    BLOOD RIGHT HAND Performed at [place] [address] [place], [place] [address]    Special Requests  Final    BOTTLES DRAWN AEROBIC ONLY Blood Culture results may not be optimal due to an inadequate volume of blood received in culture bottles Performed at [facility], [place] [address], [place], [place] [address]    Culture   Final    NO GROWTH 4 DAYS Performed at [facility], [place] [address], [place], [place] [address]    Report Status PENDING  Incomplete  Respiratory (~20 pathogens) panel by PCR     Status: None   Collection Time: 01/04/21 10:03 AM   Specimen: Nasopharyngeal Swab; Respiratory  Result Value Ref Range Status   Adenovirus NOT DETECTED NOT DETECTED Final   Coronavirus 229E NOT DETECTED NOT DETECTED Final    Comment: (NOTE) The Coronavirus on the Respiratory Panel, DOES NOT  test for the novel  Coronavirus (2019 nCoV)    Coronavirus HKU1 NOT DETECTED NOT DETECTED Final   Coronavirus NL63 NOT DETECTED NOT DETECTED Final   Coronavirus OC43 NOT DETECTED NOT DETECTED Final   Metapneumovirus NOT DETECTED NOT DETECTED Final   Rhinovirus / Enterovirus NOT DETECTED NOT DETECTED Final   Influenza A NOT DETECTED NOT DETECTED Final   Influenza B NOT DETECTED NOT DETECTED Final   Parainfluenza Virus 1 NOT DETECTED NOT DETECTED Final   Parainfluenza Virus 2 NOT DETECTED NOT DETECTED Final   Parainfluenza Virus 3 NOT DETECTED NOT DETECTED Final   Parainfluenza Virus 4 NOT DETECTED NOT DETECTED Final   Respiratory Syncytial Virus NOT DETECTED NOT DETECTED Final   Bordetella pertussis NOT DETECTED NOT DETECTED Final   Bordetella Parapertussis NOT DETECTED NOT DETECTED Final   Chlamydophila pneumoniae NOT DETECTED NOT DETECTED Final   Mycoplasma pneumoniae NOT DETECTED NOT DETECTED Final    Comment: Performed at [facility], [place]. [address] [place], [place] [address]  SARS CORONAVIRUS 2 (TAT 6-24 HRS) Nasopharyngeal Nasopharyngeal Swab     Status: None   Collection Time: 01/07/21  3:02 PM   Specimen: Nasopharyngeal Swab  Result Value Ref Range Status   SARS Coronavirus 2 NEGATIVE NEGATIVE Final    Comment: (NOTE) SARS-CoV-2 target nucleic acids are NOT DETECTED.  The SARS-CoV-2 RNA is generally detectable in upper and lower respiratory specimens during the acute phase of infection. Negative results do not preclude SARS-CoV-2 infection, do not rule out co-infections with other pathogens, and should not be used as the sole basis for treatment or other patient management decisions. Negative results must be combined with clinical observations, patient history, and epidemiological information. The expected result is Negative.  Fact Sheet for Patients: SugarRoll.be  Fact Sheet for Healthcare  Providers: https://www.woods-mathews.com/  This test is not yet approved or cleared by the [place] FDA and  has been authorized for detection and/or diagnosis of SARS-CoV-2 by FDA under an Emergency Use Authorization (EUA). This EUA will remain  in effect (meaning this test can be used) for the duration of the COVID-19 declaration under Se ction 564(b)(1) of the Act, 21 U.S.C. section 360bbb-3(b)(1), unless the authorization is terminated or revoked sooner.  Performed at [facility], [place] [address], [place], [place] [address]        Radiology Studies: CT THORACIC SPINE W CONTRAST  Result Date: 01/06/2021 CLINICAL DATA:  Weakness. Back pain. Fever. History of metastatic lung cancer. EXAM: CT THORACIC AND LUMBAR SPINE WITHOUT CONTRAST TECHNIQUE: Multidetector CT imaging of the thoracic and lumbar spine was performed without contrast. Multiplanar CT image reconstructions were also generated. COMPARISON:  Radiography 07/19/2014 FINDINGS: CT  THORACIC SPINE FINDINGS Alignment: Mild thoracic region scoliotic curvature. Vertebrae: No fracture or lytic destructive bone lesion. Paraspinal and other soft tissues: Mild dependent atelectasis or scarring in the lungs. Mass in the posterior left mid lung, not primarily or completely evaluated. Disc levels: No significant disc space finding. No apparent stenosis of the cord or foramina. CT LUMBAR SPINE FINDINGS Segmentation: 5 lumbar type vertebral bodies. Alignment: 1 mm of degenerative anterolisthesis at L4-5. Vertebrae: No fracture or lytic destructive lesion. Paraspinal and other soft tissues: No acute finding. See results of recent abdominal CT. Disc levels: No one 2, L2-3 and L3-4: Disc bulges. Facet and ligamentous hypertrophy. Mild multifactorial stenosis at L3-4 but without likely neural compression. L4-5: Chronic facet degeneration and hypertrophy. 1 mm of anterolisthesis. Bulging of the disc. Multifactorial spinal stenosis at  this level that could be symptomatic. L5-S1: Bulging of the disc. Facet and ligamentous hypertrophy. Stenosis of the subarticular lateral recesses and foramina, left more than right. Neural compression could possibly occur, particularly on the left IMPRESSION: CT THORACIC SPINE IMPRESSION Negative study. No evidence of metastatic disease or fracture. No apparent stenosis. CT LUMBAR SPINE IMPRESSION No metastatic disease or fracture. Lower lumbar degenerative changes. Multifactorial stenosis at L4-5 that could cause neural compression. Left more than right lateral recess and foraminal stenosis at L5-S1 could cause neural compression, particularly on the left. Mild multifactorial stenosis at L3-4. Electronically Signed   By: Nelson Chimes M.D.   On: 01/06/2021 15:03   CT LUMBAR SPINE W CONTRAST  Result Date: 01/06/2021 CLINICAL DATA:  Weakness. Back pain. Fever. History of metastatic lung cancer. EXAM: CT THORACIC AND LUMBAR SPINE WITHOUT CONTRAST TECHNIQUE: Multidetector CT imaging of the thoracic and lumbar spine was performed without contrast. Multiplanar CT image reconstructions were also generated. COMPARISON:  Radiography 07/19/2014 FINDINGS: CT THORACIC SPINE FINDINGS Alignment: Mild thoracic region scoliotic curvature. Vertebrae: No fracture or lytic destructive bone lesion. Paraspinal and other soft tissues: Mild dependent atelectasis or scarring in the lungs. Mass in the posterior left mid lung, not primarily or completely evaluated. Disc levels: No significant disc space finding. No apparent stenosis of the cord or foramina. CT LUMBAR SPINE FINDINGS Segmentation: 5 lumbar type vertebral bodies. Alignment: 1 mm of degenerative anterolisthesis at L4-5. Vertebrae: No fracture or lytic destructive lesion. Paraspinal and other soft tissues: No acute finding. See results of recent abdominal CT. Disc levels: No one 2, L2-3 and L3-4: Disc bulges. Facet and ligamentous hypertrophy. Mild multifactorial stenosis at  L3-4 but without likely neural compression. L4-5: Chronic facet degeneration and hypertrophy. 1 mm of anterolisthesis. Bulging of the disc. Multifactorial spinal stenosis at this level that could be symptomatic. L5-S1: Bulging of the disc. Facet and ligamentous hypertrophy. Stenosis of the subarticular lateral recesses and foramina, left more than right. Neural compression could possibly occur, particularly on the left IMPRESSION: CT THORACIC SPINE IMPRESSION Negative study. No evidence of metastatic disease or fracture. No apparent stenosis. CT LUMBAR SPINE IMPRESSION No metastatic disease or fracture. Lower lumbar degenerative changes. Multifactorial stenosis at L4-5 that could cause neural compression. Left more than right lateral recess and foraminal stenosis at L5-S1 could cause neural compression, particularly on the left. Mild multifactorial stenosis at L3-4. Electronically Signed   By: Nelson Chimes M.D.   On: 01/06/2021 15:03    Scheduled Meds:  apixaban  5 mg Oral BID   bictegravir-emtricitabine-tenofovir AF  1 tablet Oral Daily   busPIRone  2.5 mg Oral TID   guaiFENesin  600 mg Oral BID  lidocaine  1 patch Transdermal Daily   mometasone-formoterol  2 puff Inhalation BID   montelukast  10 mg Oral QHS   polyethylene glycol  17 g Oral BID   QUEtiapine Fumarate  150 mg Oral QHS   rOPINIRole  1 mg Oral QHS   senna-docusate  1 tablet Oral QHS   venlafaxine XR  150 mg Oral Daily   Continuous Infusions:    LOS: 5 days   Time spent: 27 minutes   Darliss Cheney, MD [facility]  01/08/2021, 11:30 AM   How to contact the [facility] Attending or Consulting provider [place] or covering provider during after hours [place], for this patient?  Check the care team in [facility] and look for a) attending/consulting TR[facility] provider listed and b) the [facility] team listed. Page or secure chat [phone]. Log into [email or web address] and use [place]'s universal password to access. If you do not have the password, please  contact the hospital operator. Locate the [facility] provider you are looking for under [facility] and page to a number that you can be directly reached. If you still have difficulty reaching the provider, please page the [facility] (Director on Call) for the Hospitalists listed on amion for assistance.

## 2021-01-09 LAB — CBC WITH DIFFERENTIAL/PLATELET
Abs Immature Granulocytes: 0.14 10*3/uL — ABNORMAL HIGH (ref 0.00–0.07)
Basophils Absolute: 0 10*3/uL (ref 0.0–0.1)
Basophils Relative: 0 %
Eosinophils Absolute: 0.2 10*3/uL (ref 0.0–0.5)
Eosinophils Relative: 2 %
HCT: 29.5 % — ABNORMAL LOW (ref 36.0–46.0)
Hemoglobin: 8.8 g/dL — ABNORMAL LOW (ref 12.0–15.0)
Immature Granulocytes: 2 %
Lymphocytes Relative: 23 %
Lymphs Abs: 1.8 10*3/uL (ref 0.7–4.0)
MCH: 26.4 pg (ref 26.0–34.0)
MCHC: 29.8 g/dL — ABNORMAL LOW (ref 30.0–36.0)
MCV: 88.6 fL (ref 80.0–100.0)
Monocytes Absolute: 0.5 10*3/uL (ref 0.1–1.0)
Monocytes Relative: 6 %
Neutro Abs: 5.2 10*3/uL (ref 1.7–7.7)
Neutrophils Relative %: 67 %
Platelets: 286 10*3/uL (ref 150–400)
RBC: 3.33 MIL/uL — ABNORMAL LOW (ref 3.87–5.11)
RDW: 15.8 % — ABNORMAL HIGH (ref 11.5–15.5)
WBC: 7.9 10*3/uL (ref 4.0–10.5)
nRBC: 0 % (ref 0.0–0.2)

## 2021-01-09 LAB — CULTURE, BLOOD (ROUTINE X 2)
Culture: NO GROWTH
Culture: NO GROWTH
Special Requests: ADEQUATE

## 2021-01-09 MED ORDER — OXYCODONE HCL 5 MG PO TABS: 5.0000 mg | ORAL_TABLET | Freq: Three times a day (TID) | ORAL | 0 refills | Status: DC | PRN

## 2021-01-09 MED ORDER — TRAMADOL HCL 50 MG PO TABS: 50.0000 mg | ORAL_TABLET | Freq: Three times a day (TID) | ORAL | 0 refills | Status: DC

## 2021-01-09 NOTE — Discharge Summary (Signed)
Physician Discharge Summary  [name] LPF:[id number] DOB: 10/13/1945 DOA: 01/03/2021  PCP: Nolene Ebbs, MD  Admit date: 01/03/2021 Discharge date: 01/09/2021 30 Day Unplanned Readmission Risk Score    Flowsheet Row ED to Hosp-Admission (Current) from 01/03/2021 in [place]  30 Day Unplanned Readmission Risk Score (%) 21.79 Filed at 01/09/2021 0400       This score is the patient's risk of an unplanned readmission within 30 days of being discharged (0 -100%). The score is based on dignosis, age, lab data, medications, orders, and past utilization.   Low:  0-14.9   Medium: 15-21.9   High: 22-29.9   Extreme: 30 and above          Admitted From: [facility] Disposition: [facility]  Recommendations for Outpatient Follow-up:  Follow up with PCP in 1-2 weeks Please obtain BMP/CBC in one week Please follow up with your PCP on the following pending results: Unresulted Labs (From admission, onward)     Start     Ordered   01/05/21 0500  CBC with Differential/Platelet  Daily,   R     Question:  Specimen collection method  Answer:  Lab=Lab collect   01/04/21 [place]: None Equipment/Devices: None  Discharge Condition: Stable CODE STATUS: DNR Diet recommendation: Cardiac  Subjective: Seen and examined.  No new complaint other than chronic body ache.  She initially expressed her desire that she wants to go home.  Brief/Interim Summary: [name] is a 75 y.o. female with medical history significant of COPD on 2L O2, lung cancer w/ mets to brain, HIV, HLD. Presented with generalized weakness and fever.  Associated with poor appetite but no nausea, vomiting or chills.  She has noted increased headache and fatigue. Her facility reports that she had fever up to 102 over the last several days. She's had O2 sats down into the 80's. She's required an increase of her chronic O2 support.  They became concerned and sent her to the ED for eval.   ED Course: CXR was negative. CTH was concerning for new tumor. An MRI was obtained. She was noted to be in AKI. Fluids were started. UA and Ucx are still pending. TRH was called for admission.   AKI/dehydration: Due to poor p.o. intake.  CT abdomen/pelvis unremarkable.  AKI resolved.     Abdominal pain/constipation: Fecal disimpaction done in the ED.  Continue MiraLAX and senna.  No more pain today.   Generalized weakness: Likely chronic due to multiple medical issues.  PT OT consulted. They recommended returning to SNF for long-term care.   History of prior right MCA stroke with left hemiparesis: Noted.   Fever/bacteremia: Last temperature spike of 101 around 9 PM on 01/05/2021. Initial 1 set of blood culture was drawn which was positive for Staph aureus which could be contamination since repeat blood culture from 01/04/2021 remains negative so far.  CT abdomen, chest x-ray as well as CT thoracolumbar spine all negative for any source of infection.  Her leukocytosis has also resolved.  UA not impressive enough and urine culture showing less than 100,000 colonies of procidentia stuarti.  On 01/07/2021, discussed with Dr. Graylon Good of ID, her fever could very well be coming from brain metastasis.  Since there is no source of infection, we decided to give her a trial of being off of antibiotics.  She has  remained afebrile for about48 hours  48 hours.  She is being discharged back to her facility.   Acute metabolic encephalopathy: She was initially very lethargic and confused.  Now she is fully alert and oriented.  Sometimes she just does not like to talk and answer questions.   COPD/acute on chronic hypoxic respiratory failure: Uses 2 to 3 L of oxygen at home and she is at her baseline now.   HIV: continue home regimen/Biktarvy   Hx of PTE: Continue Eliquis.   Hx of lung cancer w/ brain mets, s/p brain tumor resection and radiation Headache      - CTH was concerning for possibility of a new tumor in the right frontoparietal lobe. An MRI was obtained showing an enhancement along that region.  Admitting hospitalist discussed the case with Dr. Lisbeth Renshaw and Dr. Mickeal Skinner.  According to them, her case will be brought before the tumor board. Dr. Mickeal Skinner recommended holding off on steroids for right now.   Goals of Care: Seen by palliative care.  Apparently she is very well-known to them.  Their help is very appreciated.  Patient had chosen to be DNR in the past when she was having full capacity to make decisions.  Based off of that, palliative care switched her back to DNR.  They have also recommended hospice.All arrangements have been made for the patient to return to her facility and follow-up with palliative care as she was doing and eventually the plan is to transition her to hospice over there.  Reportedly she has been told by her oncologist that there is no further treatment for her cancer.  Our palliative care department also recommended hospice  Discharge Diagnoses:  Active Problems:   HIV (human immunodeficiency virus infection) ([place])   Chronic obstructive pulmonary disease with (acute) exacerbation (HCC)   Brain metastasis ([place])   AKI (acute kidney injury) ([place])   Acute metabolic encephalopathy   Chronic respiratory failure with hypoxia ([place])    Discharge Instructions   Allergies as of 01/09/2021   No Known Allergies      Medication List     STOP taking these medications    doxycycline 100 MG capsule Commonly known as: VIBRAMYCIN   hydrOXYzine 25 MG tablet Commonly known as: ATARAX/VISTARIL   predniSONE 20 MG tablet Commonly known as: DELTASONE   sodium chloride 0.65 % Soln nasal spray Commonly known as: OCEAN       TAKE these medications    albuterol (2.5 MG/3ML) 0.083% nebulizer solution Commonly known as: PROVENTIL Take 2.5 mg by nebulization every 4 (four) hours as needed for wheezing or shortness of breath.    albuterol 108 (90 Base) MCG/ACT inhaler Commonly known as: VENTOLIN HFA Inhale 1 puff into the lungs every 6 (six) hours as needed for shortness of breath.   ammonium lactate 12 % lotion Commonly known as: LAC-HYDRIN Apply 1 application topically in the morning and at bedtime.   apixaban 5 MG Tabs tablet Commonly known as: ELIQUIS Take 1 tablet (5 mg total) by mouth 2 (two) times daily.   Biktarvy 50-200-25 MG Tabs tablet Generic drug: bictegravir-emtricitabine-tenofovir AF Take 1 tablet by mouth daily.   busPIRone 5 MG tablet Commonly known as: BUSPAR Take 2.5 mg by mouth 3 (three) times daily.   diclofenac Sodium 1 % Gel Commonly known as: VOLTAREN Apply 4 g topically every 4 (four) hours as needed for pain.   eucerin cream Apply 1 application topically See admin instructions. Apply cream to the entire body after  bath every day   Fluticasone-Salmeterol 500-50 MCG/DOSE Aepb Commonly known as: ADVAIR Inhale 1 puff into the lungs 2 (two) times daily.   gabapentin 800 MG tablet Commonly known as: NEURONTIN Take 800 mg by mouth 3 (three) times daily.   ibuprofen 200 MG tablet Commonly known as: ADVIL Take 800 mg by mouth every 6 (six) hours as needed for moderate pain.   Lidocaine 4 % Ptch Apply 1 patch topically daily. Right Shoulder   Melatonin 3 MG Caps Take 1 capsule by mouth at bedtime.   montelukast 10 MG tablet Commonly known as: SINGULAIR Take 10 mg by mouth at bedtime.   oxyCODONE 5 MG immediate release tablet Commonly known as: Oxy IR/ROXICODONE Take 1 tablet (5 mg total) by mouth every 8 (eight) hours as needed for moderate pain.   polyethylene glycol 17 g packet Commonly known as: MIRALAX / GLYCOLAX Take 17 g by mouth 2 (two) times daily.   QUEtiapine Fumarate 150 MG 24 hr tablet Commonly known as: SEROQUEL XR Take 150 mg by mouth at bedtime.   rOPINIRole 1 MG tablet Commonly known as: Requip Take 1 tablet (1 mg total) by mouth at bedtime.    sennosides-docusate sodium 8.6-50 MG tablet Commonly known as: SENOKOT-S Take 1 tablet by mouth at bedtime.   simethicone 125 MG chewable tablet Commonly known as: MYLICON Chew 762 mg by mouth every 6 (six) hours as needed for flatulence.   Spiriva Respimat 2.5 MCG/ACT Aers Generic drug: Tiotropium Bromide Monohydrate INHALE 2 PUFFS INTO THE LUNGS DAILY   tiZANidine 4 MG tablet Commonly known as: ZANAFLEX Take 4 mg by mouth every 12 (twelve) hours as needed (pain).   traMADol 50 MG tablet Commonly known as: ULTRAM Take 1 tablet (50 mg total) by mouth in the morning, at noon, and at bedtime.   Venlafaxine HCl 150 MG Tb24 Take 1 tablet by mouth daily.   VITAMIN B50 COMPLEX PO Take 1 tablet by mouth daily.   Vitamin C 500 MG Caps Take 1 capsule by mouth daily.        Follow-up Information     Nolene Ebbs, MD Follow up in 1 week(s).   Specialty: Internal Medicine Contact information: [address] [address] [phone]                No Known Allergies  Consultations: Seen by palliative care but curb sided with oncology, neuro-oncology and ID.   Procedures/Studies: CT ABDOMEN PELVIS WO CONTRAST  Result Date: 01/03/2021 CLINICAL DATA:  Abdominal pain. Decreased oral intake for 3 days EXAM: CT ABDOMEN AND PELVIS WITHOUT CONTRAST TECHNIQUE: Multidetector CT imaging of the abdomen and pelvis was performed following the standard protocol without IV contrast. COMPARISON:  None. FINDINGS: Lower chest: No acute abnormality. Mild bibasilar atelectasis. Hepatobiliary: No focal liver abnormality is seen. No gallstones, gallbladder wall thickening, or biliary dilatation. Pancreas: Unremarkable. No pancreatic ductal dilatation or surrounding inflammatory changes. Spleen: Normal in size without focal abnormality. Adrenals/Urinary Tract: Adrenal glands are unremarkable. Kidneys are normal, without renal calculi, focal lesion, or hydronephrosis. Bladder is  unremarkable. Stomach/Bowel: No bowel dilatation to suggest bowel obstruction. No bowel wall thickening or inflammatory changes. Normal decompressed stomach. Normal appendix. Large amount of stool in the rectum which is dilated measuring up to 10 cm. Vascular/Lymphatic: Normal caliber abdominal aorta with mild atherosclerosis. No lymphadenopathy. Reproductive: No adnexal mass or focal abnormality. Other: No abdominal wall hernia or abnormality. No abdominopelvic ascites. Musculoskeletal: No acute osseous abnormality. No aggressive osseous lesion.  Severe bilateral facet arthropathy of the lumbar spine. 2 mm retrolisthesis of L5 on S1. Bilateral foraminal stenosis at L5-S1. IMPRESSION: 1. Severe rectal fecal impaction with the rectum dilated up to 10 cm. No bowel obstruction. 2.  Aortic Atherosclerosis (ICD10-I70.0). 3. Lumbar spine spondylosis. Electronically Signed   By: Kathreen Devoid   On: 01/03/2021 12:41   CT Head Wo Contrast  Result Date: 01/03/2021 CLINICAL DATA:  Delirium EXAM: CT HEAD WITHOUT CONTRAST TECHNIQUE: Contiguous axial images were obtained from the base of the skull through the vertex without intravenous contrast. COMPARISON:  MRI head September 05, 2020. 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 While evaluation is somewhat limited across modalities, white matter edema in the high right frontoparietal region may be increased compared to recent MRI from September 05, 2020, appearing to extend farther anteriorly in the frontal lobe. Amorphous hyperdensity at the vertex on the right, likely represents the areas of enhancement seen on prior MRI with limited evaluation on this noncontrast head CT. Similar encephalomalacia in the lateral right temporal lobe, compatible with prior hemorrhagic infarct. No evidence of acute hemorrhage. No midline shift. Similar atrophy with ex vacuo ventricular dilation. No hydrocephalus. No visible extra-axial fluid collections Vascular:  No hyperdense vessel identified. Skull: Prior high right craniotomy.  No acute fracture. Sinuses/Orbits: Visualized sinuses are clear. No acute orbital findings. Other: No mastoid effusions. IMPRESSION: 1. Prior right craniotomy for high right convexity tumor resection. While evaluation is somewhat limited across modalities, white matter edema in the high right frontoparietal region may be increased compared to recent MRI from September 05, 2020, appearing to extend further anteriorly in the frontal lobe. Recommend an MRI with contrast to further characterize and to allow for more direct comparison. 2. Amorphous hyperdensity at the vertex on the right, likely represents the areas of enhancement seen on prior MRI with limited evaluation on this noncontrast head CT. 3. Similar encephalomalacia in the lateral right temporal lobe, compatible with prior hemorrhagic infarct. Electronically Signed   By: Margaretha Sheffield MD   On: 01/03/2021 13:01   CT THORACIC SPINE W CONTRAST  Result Date: 01/06/2021 CLINICAL DATA:  Weakness. Back pain. Fever. History of metastatic lung cancer. EXAM: CT THORACIC AND LUMBAR SPINE WITHOUT CONTRAST TECHNIQUE: Multidetector CT imaging of the thoracic and lumbar spine was performed without contrast. Multiplanar CT image reconstructions were also generated. COMPARISON:  Radiography 07/19/2014 FINDINGS: CT THORACIC SPINE FINDINGS Alignment: Mild thoracic region scoliotic curvature. Vertebrae: No fracture or lytic destructive bone lesion. Paraspinal and other soft tissues: Mild dependent atelectasis or scarring in the lungs. Mass in the posterior left mid lung, not primarily or completely evaluated. Disc levels: No significant disc space finding. No apparent stenosis of the cord or foramina. CT LUMBAR SPINE FINDINGS Segmentation: 5 lumbar type vertebral bodies. Alignment: 1 mm of degenerative anterolisthesis at L4-5. Vertebrae: No fracture or lytic destructive lesion. Paraspinal and other soft  tissues: No acute finding. See results of recent abdominal CT. Disc levels: No one 2, L2-3 and L3-4: Disc bulges. Facet and ligamentous hypertrophy. Mild multifactorial stenosis at L3-4 but without likely neural compression. L4-5: Chronic facet degeneration and hypertrophy. 1 mm of anterolisthesis. Bulging of the disc. Multifactorial spinal stenosis at this level that could be symptomatic. L5-S1: Bulging of the disc. Facet and ligamentous hypertrophy. Stenosis of the subarticular lateral recesses and foramina, left more than right. Neural compression could possibly occur, particularly on the left IMPRESSION: CT THORACIC SPINE IMPRESSION Negative study. No evidence of metastatic  disease or fracture. No apparent stenosis. CT LUMBAR SPINE IMPRESSION No metastatic disease or fracture. Lower lumbar degenerative changes. Multifactorial stenosis at L4-5 that could cause neural compression. Left more than right lateral recess and foraminal stenosis at L5-S1 could cause neural compression, particularly on the left. Mild multifactorial stenosis at L3-4. Electronically Signed   By: Nelson Chimes M.D.   On: 01/06/2021 15:03   CT LUMBAR SPINE W CONTRAST  Result Date: 01/06/2021 CLINICAL DATA:  Weakness. Back pain. Fever. History of metastatic lung cancer. EXAM: CT THORACIC AND LUMBAR SPINE WITHOUT CONTRAST TECHNIQUE: Multidetector CT imaging of the thoracic and lumbar spine was performed without contrast. Multiplanar CT image reconstructions were also generated. COMPARISON:  Radiography 07/19/2014 FINDINGS: CT THORACIC SPINE FINDINGS Alignment: Mild thoracic region scoliotic curvature. Vertebrae: No fracture or lytic destructive bone lesion. Paraspinal and other soft tissues: Mild dependent atelectasis or scarring in the lungs. Mass in the posterior left mid lung, not primarily or completely evaluated. Disc levels: No significant disc space finding. No apparent stenosis of the cord or foramina. CT LUMBAR SPINE FINDINGS  Segmentation: 5 lumbar type vertebral bodies. Alignment: 1 mm of degenerative anterolisthesis at L4-5. Vertebrae: No fracture or lytic destructive lesion. Paraspinal and other soft tissues: No acute finding. See results of recent abdominal CT. Disc levels: No one 2, L2-3 and L3-4: Disc bulges. Facet and ligamentous hypertrophy. Mild multifactorial stenosis at L3-4 but without likely neural compression. L4-5: Chronic facet degeneration and hypertrophy. 1 mm of anterolisthesis. Bulging of the disc. Multifactorial spinal stenosis at this level that could be symptomatic. L5-S1: Bulging of the disc. Facet and ligamentous hypertrophy. Stenosis of the subarticular lateral recesses and foramina, left more than right. Neural compression could possibly occur, particularly on the left IMPRESSION: CT THORACIC SPINE IMPRESSION Negative study. No evidence of metastatic disease or fracture. No apparent stenosis. CT LUMBAR SPINE IMPRESSION No metastatic disease or fracture. Lower lumbar degenerative changes. Multifactorial stenosis at L4-5 that could cause neural compression. Left more than right lateral recess and foraminal stenosis at L5-S1 could cause neural compression, particularly on the left. Mild multifactorial stenosis at L3-4. Electronically Signed   By: Nelson Chimes M.D.   On: 01/06/2021 15:03   MR Brain W and Wo Contrast  Result Date: 01/03/2021 CLINICAL DATA:  Metastatic lung cancer post resection and radiation EXAM: MRI HEAD WITHOUT AND WITH CONTRAST TECHNIQUE: Multiplanar, multiecho pulse sequences of the brain and surrounding structures were obtained without and with intravenous contrast. CONTRAST:  53mL GADAVIST GADOBUTROL 1 MMOL/ML IV SOLN COMPARISON:  09/05/2020 FINDINGS: Motion artifact is present particularly on postcontrast imaging. Brain: Postoperative changes are again identified in the high right parietal region there is persistent enhancement underlying the craniotomy. This may be slightly increased.  Extensive T2 hyperintensity in the underlying frontoparietal lobes has increased in extent, for example anteriorly within the superior frontal gyrus and within the middle frontal gyrus. There is no acute infarction. Chronic blood products are noted in the above postoperative area. 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 No definite new mass or abnormal enhancement remote from above. No significant mass effect. Vascular: Major vessel flow voids at the skull base are preserved. Skull and upper cervical spine: Normal marrow signal is preserved. Sinuses/Orbits: Paranasal sinuses are aerated. Orbits are unremarkable. Other: Sella is unremarkable.  Mastoid air cells are clear. IMPRESSION: Significantly motion degraded. Persistent and possibly slightly increased enhancement underlying the right parietal craniotomy. Extent of right frontoparietal T2 hyperintensity has increased. May be radiation  therapy related. Electronically Signed   By: Macy Mis M.D.   On: 01/03/2021 15:41   DG Chest Port 1 View  Result Date: 01/03/2021 CLINICAL DATA:  Generalized weakness with decreased oral intake for 3 days. Hypoxemia. Questionable sepsis. EXAM: PORTABLE CHEST 1 VIEW COMPARISON:  Radiographs 10/07/2020 and 01/02/2020.  CT 01/02/2020. FINDINGS: 0924 hours. The heart size and mediastinal contours are stable. There is stable scarring at both lung bases. The lungs are otherwise clear. No pleural effusion or pneumothorax. Chronic left rib deformities appear unchanged. No acute osseous findings are evident. IMPRESSION: Stable chest.  No acute cardiopulmonary process identified. Electronically Signed   By: Richardean Sale M.D.   On: 01/03/2021 10:07     Discharge Exam: Vitals:   01/08/21 1958 01/09/21 0609  BP:  121/67  Pulse:  73  Resp:  18  Temp:  98 F (36.7 C)  SpO2: 97% 97%   Vitals:   01/08/21 1300 01/08/21 1950 01/08/21 1958 01/09/21 0609  BP: (!) 102/59 103/85  121/67  Pulse:  73 70  73  Resp: 18 18  18   Temp: 97.8 F (36.6 C) 97.9 F (36.6 C)  98 F (36.7 C)  TempSrc: Oral Oral  Oral  SpO2: 96% 96% 97% 97%  Weight:      Height:        General: Pt is alert, awake, not in acute distress Cardiovascular: RRR, S1/S2 +, no rubs, no gallops Respiratory: CTA bilaterally, no wheezing, no rhonchi Abdominal: Soft, NT, ND, bowel sounds + Extremities: Trace pitting edema bilateral lower extremity, no cyanosis    The results of significant diagnostics from this hospitalization (including imaging, microbiology, ancillary and laboratory) are listed below for reference.     Microbiology: Recent Results (from the past 240 hour(s))  Resp Panel by RT-PCR (Flu A&B, Covid) Nasopharyngeal Swab     Status: None   Collection Time: 01/03/21  9:32 AM   Specimen: Nasopharyngeal Swab; Nasopharyngeal(NP) swabs in vial transport medium  Result Value Ref Range Status   SARS Coronavirus 2 by RT PCR NEGATIVE NEGATIVE Final    Comment: (NOTE) SARS-CoV-2 target nucleic acids are NOT DETECTED.  The SARS-CoV-2 RNA is generally detectable in upper respiratory specimens during the acute phase of infection. The lowest concentration of SARS-CoV-2 viral copies this assay can detect is 138 copies/mL. A negative result does not preclude SARS-Cov-2 infection and should not be used as the sole basis for treatment or other patient management decisions. A negative result may occur with  improper specimen collection/handling, submission of specimen other than nasopharyngeal swab, presence of viral mutation(s) within the areas targeted by this assay, and inadequate number of viral copies(<138 copies/mL). A negative result must be combined with clinical observations, patient history, and epidemiological information. The expected result is Negative.  Fact Sheet for Patients:  EntrepreneurPulse.com.au  Fact Sheet for Healthcare Providers:   IncredibleEmployment.be  This test is no t yet approved or cleared by the [place] FDA and  has been authorized for detection and/or diagnosis of SARS-CoV-2 by FDA under an Emergency Use Authorization (EUA). This EUA will remain  in effect (meaning this test can be used) for the duration of the COVID-19 declaration under Section 564(b)(1) of the Act, 21 U.S.C.section 360bbb-3(b)(1), unless the authorization is terminated  or revoked sooner.       Influenza A by PCR NEGATIVE NEGATIVE Final   Influenza B by PCR NEGATIVE NEGATIVE Final    Comment: (NOTE) The Xpert Xpress SARS-CoV-2/FLU/RSV plus assay is  intended as an aid in the diagnosis of influenza from Nasopharyngeal swab specimens and should not be used as a sole basis for treatment. Nasal washings and aspirates are unacceptable for Xpert Xpress SARS-CoV-2/FLU/RSV testing.  Fact Sheet for Patients: EntrepreneurPulse.com.au  Fact Sheet for Healthcare Providers: IncredibleEmployment.be  This test is not yet approved or cleared by the [place] FDA and has been authorized for detection and/or diagnosis of SARS-CoV-2 by FDA under an Emergency Use Authorization (EUA). This EUA will remain in effect (meaning this test can be used) for the duration of the COVID-19 declaration under Section 564(b)(1) of the Act, 21 U.S.C. section 360bbb-3(b)(1), unless the authorization is terminated or revoked.  Performed at [facility], [place] [address], [place], [place] [address]   Group A Strep by PCR     Status: None   Collection Time: 01/03/21  9:33 AM   Specimen: Throat; Sterile Swab  Result Value Ref Range Status   Group A Strep by PCR NOT DETECTED NOT DETECTED Final    Comment: Performed at [facility], [place] [address] [place], [place] [address]  Blood Culture (routine x 2)     Status: Abnormal   Collection Time: 01/03/21 10:28 AM    Specimen: BLOOD RIGHT ARM  Result Value Ref Range Status   Specimen Description   Final    BLOOD RIGHT ARM Performed at [place] [address] [place], [place] [address]    Special Requests   Final    BOTTLES DRAWN AEROBIC AND ANAEROBIC Blood Culture results may not be optimal due to an inadequate volume of blood received in culture bottles Performed at [facility] [address] [place], [place] [address]    Culture  Setup Time   Final    GRAM POSITIVE COCCI IN CLUSTERS AEROBIC BOTTLE ONLY CRITICAL RESULT CALLED TO, READ BACK BY AND VERIFIED WITH: JUSTIN LEGGE PHARMD @0822  01/04/21 EB    Culture (A)  Final    STAPHYLOCOCCUS HOMINIS THE SIGNIFICANCE OF ISOLATING THIS ORGANISM FROM A SINGLE VENIPUNCTURE CANNOT BE PREDICTED WITHOUT FURTHER CLINICAL AND CULTURE CORRELATION. SUSCEPTIBILITIES AVAILABLE ONLY ON REQUEST. Performed at [facility], [place] [address] [place], [place] [address]    Report Status 01/06/2021 FINAL  Final  Blood Culture ID Panel (Reflexed)     Status: Abnormal   Collection Time: 01/03/21 10:28 AM  Result Value Ref Range Status   Enterococcus faecalis NOT DETECTED NOT DETECTED Final   Enterococcus Faecium NOT DETECTED NOT DETECTED Final   Listeria monocytogenes NOT DETECTED NOT DETECTED Final   Staphylococcus species DETECTED (A) NOT DETECTED Final    Comment: CRITICAL RESULT CALLED TO, READ BACK BY AND VERIFIED WITH: JUSTIN LEGGE PHARMD @0822  01/04/21 EB    Staphylococcus aureus (BCID) NOT DETECTED NOT DETECTED Final   Staphylococcus epidermidis NOT DETECTED NOT DETECTED Final   Staphylococcus lugdunensis NOT DETECTED NOT DETECTED Final   Streptococcus species NOT DETECTED NOT DETECTED Final   Streptococcus agalactiae NOT DETECTED NOT DETECTED Final   Streptococcus pneumoniae NOT DETECTED NOT DETECTED Final   Streptococcus pyogenes NOT DETECTED NOT DETECTED Final   A.calcoaceticus-baumannii NOT DETECTED NOT  DETECTED Final   Bacteroides fragilis NOT DETECTED NOT DETECTED Final   Enterobacterales NOT DETECTED NOT DETECTED Final   Enterobacter cloacae complex NOT DETECTED NOT DETECTED Final   Escherichia coli NOT DETECTED NOT DETECTED Final   Klebsiella aerogenes NOT DETECTED NOT DETECTED Final   Klebsiella oxytoca NOT DETECTED NOT DETECTED  Final   Klebsiella pneumoniae NOT DETECTED NOT DETECTED Final   Proteus species NOT DETECTED NOT DETECTED Final   Salmonella species NOT DETECTED NOT DETECTED Final   Serratia marcescens NOT DETECTED NOT DETECTED Final   Haemophilus influenzae NOT DETECTED NOT DETECTED Final   Neisseria meningitidis NOT DETECTED NOT DETECTED Final   Pseudomonas aeruginosa NOT DETECTED NOT DETECTED Final   Stenotrophomonas maltophilia NOT DETECTED NOT DETECTED Final   Candida albicans NOT DETECTED NOT DETECTED Final   Candida auris NOT DETECTED NOT DETECTED Final   Candida glabrata NOT DETECTED NOT DETECTED Final   Candida krusei NOT DETECTED NOT DETECTED Final   Candida parapsilosis NOT DETECTED NOT DETECTED Final   Candida tropicalis NOT DETECTED NOT DETECTED Final   Cryptococcus neoformans/gattii NOT DETECTED NOT DETECTED Final    Comment: Performed at [facility], [place] [address], [place], [place] [address]  Culture, Urine     Status: Abnormal   Collection Time: 01/03/21  4:43 PM   Specimen: Urine, Catheterized  Result Value Ref Range Status   Specimen Description   Final    URINE, CATHETERIZED Performed at [facility] [address], [place], [place] [address]    Special Requests   Final    NONE Performed at [facility], [place] [address], [place], [place] [address]    Culture 70,000 COLONIES/mL PROVIDENCIA STUARTII (A)  Final   Report Status 01/06/2021 FINAL  Final   Organism ID, Bacteria PROVIDENCIA STUARTII (A)  Final      Susceptibility   Providencia stuartii - MIC*    AMPICILLIN RESISTANT Resistant      CEFAZOLIN >=64 RESISTANT Resistant     CEFEPIME <=0.12 SENSITIVE Sensitive     CEFTRIAXONE <=0.25 SENSITIVE Sensitive     CIPROFLOXACIN <=0.25 SENSITIVE Sensitive     GENTAMICIN RESISTANT Resistant     IMIPENEM 2 SENSITIVE Sensitive     NITROFURANTOIN 128 RESISTANT Resistant     TRIMETH/SULFA <=20 SENSITIVE Sensitive     AMPICILLIN/SULBACTAM 16 INTERMEDIATE Intermediate     PIP/TAZO <=4 SENSITIVE Sensitive     * 70,000 COLONIES/mL PROVIDENCIA STUARTII  Culture, blood (routine x 2)     Status: None (Preliminary result)   Collection Time: 01/04/21  8:56 AM   Specimen: BLOOD RIGHT HAND  Result Value Ref Range Status   Specimen Description   Final    BLOOD RIGHT HAND Performed at [facility], [place] [address] [place], [place] [address]    Special Requests   Final    BOTTLES DRAWN AEROBIC ONLY Blood Culture adequate volume Performed at [place] [address] [place], [place] [address]    Culture   Final    NO GROWTH 4 DAYS Performed at [facility], [place] [address] [place], [place] [address]    Report Status PENDING  Incomplete  Culture, blood (routine x 2)     Status: None (Preliminary result)   Collection Time: 01/04/21  9:29 AM   Specimen: BLOOD RIGHT HAND  Result Value Ref Range Status   Specimen Description   Final    BLOOD RIGHT HAND Performed at [place] [address], [place], [place] [address]    Special Requests   Final    BOTTLES DRAWN AEROBIC ONLY Blood Culture results may not be optimal due to an inadequate volume of blood received in culture bottles Performed at [place] [address] [place],  [address]  Culture   Final    NO GROWTH 4 DAYS Performed at [facility], [place] [address] [place], [place] [address]    Report Status PENDING  Incomplete  Respiratory (~20 pathogens) panel by PCR     Status: None   Collection Time: 01/04/21 10:03 AM    Specimen: Nasopharyngeal Swab; Respiratory  Result Value Ref Range Status   Adenovirus NOT DETECTED NOT DETECTED Final   Coronavirus 229E NOT DETECTED NOT DETECTED Final    Comment: (NOTE) The Coronavirus on the Respiratory Panel, DOES NOT test for the novel  Coronavirus (2019 nCoV)    Coronavirus HKU1 NOT DETECTED NOT DETECTED Final   Coronavirus NL63 NOT DETECTED NOT DETECTED Final   Coronavirus OC43 NOT DETECTED NOT DETECTED Final   Metapneumovirus NOT DETECTED NOT DETECTED Final   Rhinovirus / Enterovirus NOT DETECTED NOT DETECTED Final   Influenza A NOT DETECTED NOT DETECTED Final   Influenza B NOT DETECTED NOT DETECTED Final   Parainfluenza Virus 1 NOT DETECTED NOT DETECTED Final   Parainfluenza Virus 2 NOT DETECTED NOT DETECTED Final   Parainfluenza Virus 3 NOT DETECTED NOT DETECTED Final   Parainfluenza Virus 4 NOT DETECTED NOT DETECTED Final   Respiratory Syncytial Virus NOT DETECTED NOT DETECTED Final   Bordetella pertussis NOT DETECTED NOT DETECTED Final   Bordetella Parapertussis NOT DETECTED NOT DETECTED Final   Chlamydophila pneumoniae NOT DETECTED NOT DETECTED Final   Mycoplasma pneumoniae NOT DETECTED NOT DETECTED Final    Comment: Performed at [facility], [place]. [address], [place], [place] [address]  SARS CORONAVIRUS 2 (TAT 6-24 HRS) Nasopharyngeal Nasopharyngeal Swab     Status: None   Collection Time: 01/07/21  3:02 PM   Specimen: Nasopharyngeal Swab  Result Value Ref Range Status   SARS Coronavirus 2 NEGATIVE NEGATIVE Final    Comment: (NOTE) SARS-CoV-2 target nucleic acids are NOT DETECTED.  The SARS-CoV-2 RNA is generally detectable in upper and lower respiratory specimens during the acute phase of infection. Negative results do not preclude SARS-CoV-2 infection, do not rule out co-infections with other pathogens, and should not be used as the sole basis for treatment or other patient management decisions. Negative results must be combined with  clinical observations, patient history, and epidemiological information. The expected result is Negative.  Fact Sheet for Patients: SugarRoll.be  Fact Sheet for Healthcare Providers: https://www.woods-mathews.com/  This test is not yet approved or cleared by the [place] FDA and  has been authorized for detection and/or diagnosis of SARS-CoV-2 by FDA under an Emergency Use Authorization (EUA). This EUA will remain  in effect (meaning this test can be used) for the duration of the COVID-19 declaration under Se ction 564(b)(1) of the Act, 21 U.S.C. section 360bbb-3(b)(1), unless the authorization is terminated or revoked sooner.  Performed at [facility], [place] [address], [place],  [address]      Labs: BNP (last 3 results) No results for input(s): BNP in the last 8760 hours. Basic Metabolic Panel: Recent Labs  Lab 01/03/21 1028 01/04/21 0148 01/05/21 0339 01/06/21 0353  NA 139 140 140 136  K 3.6 3.9 4.5 3.7  CL 100 102 104 102  CO2 27 26 28 24   GLUCOSE 106* 138* 97 96  BUN 36* 30* 25* 13  CREATININE 3.26* 1.70* 1.00 0.86  CALCIUM 8.9 8.8* 8.6* 8.2*   Liver Function Tests: Recent Labs  Lab 01/03/21 1028 01/04/21 0148  AST 24 24  ALT 21 21  ALKPHOS 68  72  BILITOT 0.8 0.8  PROT 8.9* 8.2*  ALBUMIN 3.7 3.3*   Recent Labs  Lab 01/03/21 1028  LIPASE 21   No results for input(s): AMMONIA in the last 168 hours. CBC: Recent Labs  Lab 01/05/21 0339 01/06/21 0353 01/07/21 0440 01/08/21 0451 01/09/21 0439  WBC 12.8* 10.3 11.3* 9.4 7.9  NEUTROABS 10.2* 7.9* 8.4* 6.8 5.2  HGB 9.3* 8.8* 9.8* 8.9* 8.8*  HCT 29.4* 29.5* 30.6* 29.0* 29.5*  MCV 87.5 88.9 84.8 88.1 88.6  PLT 202 243 245 267 286   Cardiac Enzymes: No results for input(s): CKTOTAL, CKMB, CKMBINDEX, TROPONINI in the last 168 hours. BNP: Invalid input(s): POCBNP CBG: No results for input(s): GLUCAP in the last 168 hours. D-Dimer No  results for input(s): DDIMER in the last 72 hours. Hgb A1c No results for input(s): HGBA1C in the last 72 hours. Lipid Profile No results for input(s): CHOL, HDL, LDLCALC, TRIG, CHOLHDL, LDLDIRECT in the last 72 hours. Thyroid function studies No results for input(s): TSH, T4TOTAL, T3FREE, THYROIDAB in the last 72 hours.  Invalid input(s): FREET3 Anemia work up No results for input(s): VITAMINB12, FOLATE, FERRITIN, TIBC, IRON, RETICCTPCT in the last 72 hours. Urinalysis    Component Value Date/Time   COLORURINE AMBER (A) 01/03/2021 1643   APPEARANCEUR CLOUDY (A) 01/03/2021 1643   LABSPEC 1.016 01/03/2021 1643   PHURINE 5.0 01/03/2021 1643   GLUCOSEU NEGATIVE 01/03/2021 1643   HGBUR SMALL (A) 01/03/2021 1643   BILIRUBINUR NEGATIVE 01/03/2021 1643   KETONESUR 5 (A) 01/03/2021 1643   PROTEINUR 100 (A) 01/03/2021 1643   UROBILINOGEN 0.2 03/28/2015 1227   NITRITE NEGATIVE 01/03/2021 1643   LEUKOCYTESUR LARGE (A) 01/03/2021 1643   Sepsis Labs Invalid input(s): PROCALCITONIN,  WBC,  LACTICIDVEN Microbiology Recent Results (from the past 240 hour(s))  Resp Panel by RT-PCR (Flu A&B, Covid) Nasopharyngeal Swab     Status: None   Collection Time: 01/03/21  9:32 AM   Specimen: Nasopharyngeal Swab; Nasopharyngeal(NP) swabs in vial transport medium  Result Value Ref Range Status   SARS Coronavirus 2 by RT PCR NEGATIVE NEGATIVE Final    Comment: (NOTE) SARS-CoV-2 target nucleic acids are NOT DETECTED.  The SARS-CoV-2 RNA is generally detectable in upper respiratory specimens during the acute phase of infection. The lowest concentration of SARS-CoV-2 viral copies this assay can detect is 138 copies/mL. A negative result does not preclude SARS-Cov-2 infection and should not be used as the sole basis for treatment or other patient management decisions. A negative result may occur with  improper specimen collection/handling, submission of specimen other than nasopharyngeal swab, presence  of viral mutation(s) within the areas targeted by this assay, and inadequate number of viral copies(<138 copies/mL). A negative result must be combined with clinical observations, patient history, and epidemiological information. The expected result is Negative.  Fact Sheet for Patients:  EntrepreneurPulse.com.au  Fact Sheet for Healthcare Providers:  IncredibleEmployment.be  This test is no t yet approved or cleared by the [place] FDA and  has been authorized for detection and/or diagnosis of SARS-CoV-2 by FDA under an Emergency Use Authorization (EUA). This EUA will remain  in effect (meaning this test can be used) for the duration of the COVID-19 declaration under Section 564(b)(1) of the Act, 21 U.S.C.section 360bbb-3(b)(1), unless the authorization is terminated  or revoked sooner.       Influenza A by PCR NEGATIVE NEGATIVE Final   Influenza B by PCR NEGATIVE NEGATIVE Final    Comment: (NOTE) The Xpert Xpress SARS-CoV-2/FLU/RSV  plus assay is intended as an aid in the diagnosis of influenza from Nasopharyngeal swab specimens and should not be used as a sole basis for treatment. Nasal washings and aspirates are unacceptable for Xpert Xpress SARS-CoV-2/FLU/RSV testing.  Fact Sheet for Patients: EntrepreneurPulse.com.au  Fact Sheet for Healthcare Providers: IncredibleEmployment.be  This test is not yet approved or cleared by the [place] FDA and has been authorized for detection and/or diagnosis of SARS-CoV-2 by FDA under an Emergency Use Authorization (EUA). This EUA will remain in effect (meaning this test can be used) for the duration of the COVID-19 declaration under Section 564(b)(1) of the Act, 21 U.S.C. section 360bbb-3(b)(1), unless the authorization is terminated or revoked.  Performed at [facility], [place] [address] [place], [place] [address]   Group A Strep  by PCR     Status: None   Collection Time: 01/03/21  9:33 AM   Specimen: Throat; Sterile Swab  Result Value Ref Range Status   Group A Strep by PCR NOT DETECTED NOT DETECTED Final    Comment: Performed at [facility], [place] [address], [place], [place] [address]  Blood Culture (routine x 2)     Status: Abnormal   Collection Time: 01/03/21 10:28 AM   Specimen: BLOOD RIGHT ARM  Result Value Ref Range Status   Specimen Description   Final    BLOOD RIGHT ARM Performed at [place] [address] [place], [place] [address]    Special Requests   Final    BOTTLES DRAWN AEROBIC AND ANAEROBIC Blood Culture results may not be optimal due to an inadequate volume of blood received in culture bottles Performed at [facility] [address] [place], [place] [address]    Culture  Setup Time   Final    GRAM POSITIVE COCCI IN CLUSTERS AEROBIC BOTTLE ONLY CRITICAL RESULT CALLED TO, READ BACK BY AND VERIFIED WITH: JUSTIN LEGGE PHARMD @0822  01/04/21 EB    Culture (A)  Final    STAPHYLOCOCCUS HOMINIS THE SIGNIFICANCE OF ISOLATING THIS ORGANISM FROM A SINGLE VENIPUNCTURE CANNOT BE PREDICTED WITHOUT FURTHER CLINICAL AND CULTURE CORRELATION. SUSCEPTIBILITIES AVAILABLE ONLY ON REQUEST. Performed at [facility], [place] [address], [place], [place] [address]    Report Status 01/06/2021 FINAL  Final  Blood Culture ID Panel (Reflexed)     Status: Abnormal   Collection Time: 01/03/21 10:28 AM  Result Value Ref Range Status   Enterococcus faecalis NOT DETECTED NOT DETECTED Final   Enterococcus Faecium NOT DETECTED NOT DETECTED Final   Listeria monocytogenes NOT DETECTED NOT DETECTED Final   Staphylococcus species DETECTED (A) NOT DETECTED Final    Comment: CRITICAL RESULT CALLED TO, READ BACK BY AND VERIFIED WITH: JUSTIN LEGGE PHARMD @0822  01/04/21 EB    Staphylococcus aureus (BCID) NOT DETECTED NOT DETECTED Final   Staphylococcus epidermidis  NOT DETECTED NOT DETECTED Final   Staphylococcus lugdunensis NOT DETECTED NOT DETECTED Final   Streptococcus species NOT DETECTED NOT DETECTED Final   Streptococcus agalactiae NOT DETECTED NOT DETECTED Final   Streptococcus pneumoniae NOT DETECTED NOT DETECTED Final   Streptococcus pyogenes NOT DETECTED NOT DETECTED Final   A.calcoaceticus-baumannii NOT DETECTED NOT DETECTED Final   Bacteroides fragilis NOT DETECTED NOT DETECTED Final   Enterobacterales NOT DETECTED NOT DETECTED Final   Enterobacter cloacae complex NOT DETECTED NOT DETECTED Final   Escherichia coli NOT DETECTED NOT DETECTED Final   Klebsiella aerogenes NOT DETECTED NOT DETECTED Final   Klebsiella oxytoca NOT  DETECTED NOT DETECTED Final   Klebsiella pneumoniae NOT DETECTED NOT DETECTED Final   Proteus species NOT DETECTED NOT DETECTED Final   Salmonella species NOT DETECTED NOT DETECTED Final   Serratia marcescens NOT DETECTED NOT DETECTED Final   Haemophilus influenzae NOT DETECTED NOT DETECTED Final   Neisseria meningitidis NOT DETECTED NOT DETECTED Final   Pseudomonas aeruginosa NOT DETECTED NOT DETECTED Final   Stenotrophomonas maltophilia NOT DETECTED NOT DETECTED Final   Candida albicans NOT DETECTED NOT DETECTED Final   Candida auris NOT DETECTED NOT DETECTED Final   Candida glabrata NOT DETECTED NOT DETECTED Final   Candida krusei NOT DETECTED NOT DETECTED Final   Candida parapsilosis NOT DETECTED NOT DETECTED Final   Candida tropicalis NOT DETECTED NOT DETECTED Final   Cryptococcus neoformans/gattii NOT DETECTED NOT DETECTED Final    Comment: Performed at [facility], [place] [address], [place], [place] [address]  Culture, Urine     Status: Abnormal   Collection Time: 01/03/21  4:43 PM   Specimen: Urine, Catheterized  Result Value Ref Range Status   Specimen Description   Final    URINE, CATHETERIZED Performed at [place] [address] [place], [place] [address]    Special  Requests   Final    NONE Performed at [facility], [place] [address], [place], [place] [address]    Culture 70,000 COLONIES/mL PROVIDENCIA STUARTII (A)  Final   Report Status 01/06/2021 FINAL  Final   Organism ID, Bacteria PROVIDENCIA STUARTII (A)  Final      Susceptibility   Providencia stuartii - MIC*    AMPICILLIN RESISTANT Resistant     CEFAZOLIN >=64 RESISTANT Resistant     CEFEPIME <=0.12 SENSITIVE Sensitive     CEFTRIAXONE <=0.25 SENSITIVE Sensitive     CIPROFLOXACIN <=0.25 SENSITIVE Sensitive     GENTAMICIN RESISTANT Resistant     IMIPENEM 2 SENSITIVE Sensitive     NITROFURANTOIN 128 RESISTANT Resistant     TRIMETH/SULFA <=20 SENSITIVE Sensitive     AMPICILLIN/SULBACTAM 16 INTERMEDIATE Intermediate     PIP/TAZO <=4 SENSITIVE Sensitive     * 70,000 COLONIES/mL PROVIDENCIA STUARTII  Culture, blood (routine x 2)     Status: None (Preliminary result)   Collection Time: 01/04/21  8:56 AM   Specimen: BLOOD RIGHT HAND  Result Value Ref Range Status   Specimen Description   Final    BLOOD RIGHT HAND Performed at [facility], [place] [address], [place], [place] [address]    Special Requests   Final    BOTTLES DRAWN AEROBIC ONLY Blood Culture adequate volume Performed at [facility] [place] [address], [place], [place] [address]    Culture   Final    NO GROWTH 4 DAYS Performed at [facility], [place] [address] [place], [place] [address]    Report Status PENDING  Incomplete  Culture, blood (routine x 2)     Status: None (Preliminary result)   Collection Time: 01/04/21  9:29 AM   Specimen: BLOOD RIGHT HAND  Result Value Ref Range Status   Specimen Description   Final    BLOOD RIGHT HAND Performed at [place] [address], [place], [place] [address]    Special Requests   Final    BOTTLES DRAWN AEROBIC ONLY Blood Culture results may not be optimal due to an inadequate volume of blood received in  culture bottles Performed at [facility] [place]  [address] [place], [place] [address]    Culture   Final    NO GROWTH 4 DAYS Performed at [facility], [place] [address], [place], [place] [address]    Report Status PENDING  Incomplete  Respiratory (~20 pathogens) panel by PCR     Status: None   Collection Time: 01/04/21 10:03 AM   Specimen: Nasopharyngeal Swab; Respiratory  Result Value Ref Range Status   Adenovirus NOT DETECTED NOT DETECTED Final   Coronavirus 229E NOT DETECTED NOT DETECTED Final    Comment: (NOTE) The Coronavirus on the Respiratory Panel, DOES NOT test for the novel  Coronavirus (2019 nCoV)    Coronavirus HKU1 NOT DETECTED NOT DETECTED Final   Coronavirus NL63 NOT DETECTED NOT DETECTED Final   Coronavirus OC43 NOT DETECTED NOT DETECTED Final   Metapneumovirus NOT DETECTED NOT DETECTED Final   Rhinovirus / Enterovirus NOT DETECTED NOT DETECTED Final   Influenza A NOT DETECTED NOT DETECTED Final   Influenza B NOT DETECTED NOT DETECTED Final   Parainfluenza Virus 1 NOT DETECTED NOT DETECTED Final   Parainfluenza Virus 2 NOT DETECTED NOT DETECTED Final   Parainfluenza Virus 3 NOT DETECTED NOT DETECTED Final   Parainfluenza Virus 4 NOT DETECTED NOT DETECTED Final   Respiratory Syncytial Virus NOT DETECTED NOT DETECTED Final   Bordetella pertussis NOT DETECTED NOT DETECTED Final   Bordetella Parapertussis NOT DETECTED NOT DETECTED Final   Chlamydophila pneumoniae NOT DETECTED NOT DETECTED Final   Mycoplasma pneumoniae NOT DETECTED NOT DETECTED Final    Comment: Performed at [facility], [place]. [address] [place] [address] [place], [place] [address]  SARS CORONAVIRUS 2 (TAT 6-24 HRS) Nasopharyngeal Nasopharyngeal Swab     Status: None   Collection Time: 01/07/21  3:02 PM   Specimen: Nasopharyngeal Swab  Result Value Ref Range Status   SARS Coronavirus 2 NEGATIVE NEGATIVE Final    Comment: (NOTE) SARS-CoV-2 target nucleic acids are NOT DETECTED.  The  SARS-CoV-2 RNA is generally detectable in upper and lower respiratory specimens during the acute phase of infection. Negative results do not preclude SARS-CoV-2 infection, do not rule out co-infections with other pathogens, and should not be used as the sole basis for treatment or other patient management decisions. Negative results must be combined with clinical observations, patient history, and epidemiological information. The expected result is Negative.  Fact Sheet for Patients: SugarRoll.be  Fact Sheet for Healthcare Providers: https://www.woods-mathews.com/  This test is not yet approved or cleared by the [place] FDA and  has been authorized for detection and/or diagnosis of SARS-CoV-2 by FDA under an Emergency Use Authorization (EUA). This EUA will remain  in effect (meaning this test can be used) for the duration of the COVID-19 declaration under Se ction 564(b)(1) of the Act, 21 U.S.C. section 360bbb-3(b)(1), unless the authorization is terminated or revoked sooner.  Performed at [facility], [place] [address], [place],  [address]      Time coordinating discharge: Over 30 minutes  SIGNED:   Darliss Cheney, MD  [facility] 01/09/2021, 7:41 AM  If 7PM-7AM, please contact night-coverage [email or web address]

## 2021-01-09 NOTE — TOC Transition Note (Addendum)
Transition of Care [facility]) - CM/SW Discharge Note   Patient Details  Name: [name] MRN: [id number] Date of Birth: [birth year]  [facility] of Care [facility] CM/SW Contact:  Dessa Phi, RN Phone Number: 01/09/2021, 10:22 AM   Clinical Narrative: for d/c back to [facility] aware. [facility] transport once available rm#, & tel# for nsg report.   10:34a-going to rm#205/nsg tel# for report [phone]. [facility] to be called once ready. 10:51a-[facility] called.    Final next level of care: Long Term Nursing Home Barriers to Discharge: No Barriers Identified   Patient Goals and CMS Choice Patient states their goals for this hospitalization and ongoing recovery are:: To return back to [facility] where she is a LTC resident. CMS Medicare.gov Compare Post Acute Care list provided to:: Patient Choice offered to / list presented to : Patient  Discharge Placement              Patient chooses bed at: [facility] Patient to be transferred to facility by: [facility] Name of family member notified: BJ brother left message [phone] Patient and family notified of of transfer: 01/09/21  Discharge Plan and Services     Post Acute Care Choice: [facility]                               Social Determinants of Health (SDOH) Interventions     Readmission Risk Interventions No flowsheet data found.

## 2021-01-09 NOTE — Plan of Care (Signed)
  Problem: Health Behavior/Discharge Planning: Goal: Ability to manage health-related needs will improve Outcome: Progressing   Problem: Clinical Measurements: Goal: Will remain free from infection Outcome: Progressing   Problem: Activity: Goal: Risk for activity intolerance will decrease Outcome: Progressing

## 2021-01-09 NOTE — Care Management Important Message (Signed)
Important Message  Patient Details IM Letter given to the Patient. Name: [name] MRN: [id number] Date of Birth: 24-May-1946   Medicare Important Message Given:  Yes     Kerin Salen 01/09/2021, 4:01 PM

## 2021-01-26 ENCOUNTER — Emergency Department (HOSPITAL_COMMUNITY): Payer: Medicare HMO

## 2021-01-26 ENCOUNTER — Emergency Department (HOSPITAL_COMMUNITY)
Admission: EM | Admit: 2021-01-26 | Discharge: 2021-01-26 | Disposition: A | Discharge status: Skilled Nursing Facility | Payer: Medicare HMO | Attending: Emergency Medicine | Admitting: Emergency Medicine

## 2021-01-26 ENCOUNTER — Encounter (HOSPITAL_COMMUNITY): Payer: Self-pay

## 2021-01-26 ENCOUNTER — Other Ambulatory Visit: Payer: Self-pay

## 2021-01-26 DIAGNOSIS — R1084 Generalized abdominal pain: Secondary | ICD-10-CM

## 2021-01-26 DIAGNOSIS — J441 Chronic obstructive pulmonary disease with (acute) exacerbation: Secondary | ICD-10-CM | POA: Insufficient documentation

## 2021-01-26 DIAGNOSIS — R109 Unspecified abdominal pain: Secondary | ICD-10-CM | POA: Diagnosis present

## 2021-01-26 DIAGNOSIS — Z20822 Contact with and (suspected) exposure to covid-19: Secondary | ICD-10-CM | POA: Diagnosis not present

## 2021-01-26 DIAGNOSIS — R197 Diarrhea, unspecified: Secondary | ICD-10-CM | POA: Insufficient documentation

## 2021-01-26 DIAGNOSIS — Z85118 Personal history of other malignant neoplasm of bronchus and lung: Secondary | ICD-10-CM | POA: Diagnosis not present

## 2021-01-26 DIAGNOSIS — Z21 Asymptomatic human immunodeficiency virus [HIV] infection status: Secondary | ICD-10-CM | POA: Insufficient documentation

## 2021-01-26 DIAGNOSIS — Z7901 Long term (current) use of anticoagulants: Secondary | ICD-10-CM | POA: Diagnosis not present

## 2021-01-26 DIAGNOSIS — R112 Nausea with vomiting, unspecified: Secondary | ICD-10-CM | POA: Insufficient documentation

## 2021-01-26 DIAGNOSIS — N183 Chronic kidney disease, stage 3 unspecified: Secondary | ICD-10-CM | POA: Insufficient documentation

## 2021-01-26 DIAGNOSIS — J449 Chronic obstructive pulmonary disease, unspecified: Secondary | ICD-10-CM | POA: Diagnosis not present

## 2021-01-26 DIAGNOSIS — I129 Hypertensive chronic kidney disease with stage 1 through stage 4 chronic kidney disease, or unspecified chronic kidney disease: Secondary | ICD-10-CM | POA: Diagnosis not present

## 2021-01-26 DIAGNOSIS — Z7951 Long term (current) use of inhaled steroids: Secondary | ICD-10-CM | POA: Diagnosis not present

## 2021-01-26 LAB — PROTIME-INR
INR: 1.5 — ABNORMAL HIGH (ref 0.8–1.2)
Prothrombin Time: 17.6 seconds — ABNORMAL HIGH (ref 11.4–15.2)

## 2021-01-26 LAB — CBC WITH DIFFERENTIAL/PLATELET
Abs Immature Granulocytes: 0.01 10*3/uL (ref 0.00–0.07)
Basophils Absolute: 0 10*3/uL (ref 0.0–0.1)
Basophils Relative: 0 %
Eosinophils Absolute: 0.1 10*3/uL (ref 0.0–0.5)
Eosinophils Relative: 2 %
HCT: 38.6 % (ref 36.0–46.0)
Hemoglobin: 11 g/dL — ABNORMAL LOW (ref 12.0–15.0)
Immature Granulocytes: 0 %
Lymphocytes Relative: 39 %
Lymphs Abs: 2.3 10*3/uL (ref 0.7–4.0)
MCH: 26.8 pg (ref 26.0–34.0)
MCHC: 28.5 g/dL — ABNORMAL LOW (ref 30.0–36.0)
MCV: 94.1 fL (ref 80.0–100.0)
Monocytes Absolute: 0.5 10*3/uL (ref 0.1–1.0)
Monocytes Relative: 8 %
Neutro Abs: 3 10*3/uL (ref 1.7–7.7)
Neutrophils Relative %: 51 %
Platelets: 243 10*3/uL (ref 150–400)
RBC: 4.1 MIL/uL (ref 3.87–5.11)
RDW: 16.3 % — ABNORMAL HIGH (ref 11.5–15.5)
WBC: 5.9 10*3/uL (ref 4.0–10.5)
nRBC: 0 % (ref 0.0–0.2)

## 2021-01-26 LAB — POC OCCULT BLOOD, ED: Fecal Occult Bld: POSITIVE — AB

## 2021-01-26 LAB — APTT: aPTT: 47 seconds — ABNORMAL HIGH (ref 24–36)

## 2021-01-26 LAB — COMPREHENSIVE METABOLIC PANEL
ALT: 12 U/L (ref 0–44)
AST: 19 U/L (ref 15–41)
Albumin: 4 g/dL (ref 3.5–5.0)
Alkaline Phosphatase: 62 U/L (ref 38–126)
Anion gap: 12 (ref 5–15)
BUN: 13 mg/dL (ref 8–23)
CO2: 25 mmol/L (ref 22–32)
Calcium: 8.9 mg/dL (ref 8.9–10.3)
Chloride: 102 mmol/L (ref 98–111)
Creatinine, Ser: 1.13 mg/dL — ABNORMAL HIGH (ref 0.44–1.00)
GFR, Estimated: 51 mL/min — ABNORMAL LOW (ref >60)
Glucose, Bld: 83 mg/dL (ref 70–99)
Potassium: 4.1 mmol/L (ref 3.5–5.1)
Sodium: 139 mmol/L (ref 135–145)
Total Bilirubin: 0.5 mg/dL (ref 0.3–1.2)
Total Protein: 8.9 g/dL — ABNORMAL HIGH (ref 6.5–8.1)

## 2021-01-26 LAB — LACTIC ACID, PLASMA
Lactic Acid, Venous: 1.2 mmol/L (ref 0.5–1.9)
Lactic Acid, Venous: 0.7 mmol/L (ref 0.5–1.9)

## 2021-01-26 LAB — RESP PANEL BY RT-PCR (FLU A&B, COVID) ARPGX2
Influenza A by PCR: NEGATIVE
Influenza B by PCR: NEGATIVE
SARS Coronavirus 2 by RT PCR: NEGATIVE

## 2021-01-26 LAB — URINALYSIS, ROUTINE W REFLEX MICROSCOPIC
Bilirubin Urine: NEGATIVE
Glucose, UA: NEGATIVE mg/dL
Hgb urine dipstick: NEGATIVE
Ketones, ur: NEGATIVE mg/dL
Leukocytes,Ua: NEGATIVE
Nitrite: NEGATIVE
Protein, ur: NEGATIVE mg/dL
Specific Gravity, Urine: >1.046 — ABNORMAL HIGH (ref 1.005–1.030)
pH: 5 (ref 5.0–8.0)

## 2021-01-26 LAB — LIPASE, BLOOD: Lipase: 22 U/L (ref 11–51)

## 2021-01-26 MED ORDER — PANTOPRAZOLE SODIUM 40 MG IV SOLR
40.0000 mg | Freq: Once | INTRAVENOUS | Status: AC
  Administered 2021-01-26: 40 mg via INTRAVENOUS
  Filled 2021-01-26: qty 40

## 2021-01-26 MED ORDER — ONDANSETRON 4 MG PO TBDP: 4.0000 mg | ORAL_TABLET | Freq: Three times a day (TID) | ORAL | 0 refills | Status: DC | PRN

## 2021-01-26 MED ORDER — SODIUM CHLORIDE 0.9 % IV BOLUS
1000.0000 mL | Freq: Once | INTRAVENOUS | Status: AC
  Administered 2021-01-26: 1000 mL via INTRAVENOUS

## 2021-01-26 MED ORDER — ONDANSETRON HCL 4 MG/2ML IJ SOLN
4.0000 mg | Freq: Once | INTRAMUSCULAR | Status: AC
  Administered 2021-01-26: 4 mg via INTRAVENOUS
  Filled 2021-01-26: qty 2

## 2021-01-26 MED ORDER — IOHEXOL 350 MG/ML SOLN
80.0000 mL | Freq: Once | INTRAVENOUS | Status: AC | PRN
  Administered 2021-01-26: 80 mL via INTRAVENOUS

## 2021-01-26 NOTE — ED Provider Notes (Signed)
[facility] DEPT Provider Note   CSN: [id number] Arrival date & time: 01/26/21  1109     History Chief Complaint  Patient presents with   Abdominal Pain   Vomiting    [name] is a 75 y.o. female presenting from nursing facility with concern for abdominal pain, nausea, and diarrhea that started yesterday.  She denies any fevers or chills, endorses 2 episodes of NBNB emesis since yesterday.  Also endorses 2 episodes of loose stool.  Does endorse episode of dark sticky stool with history of rectal bleed anticoagulated on Eliquis.  Denies any hematochezia.  I personally read this patient's medical records.  She has history of HIV on Biktarvy, COPD, and CVA for which she is anticoagulated on Eliquis.  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   Neuromuscular disorder (HCC)    fingers/feet neuropathy   Non-small cell lung cancer ([place])    Renal disorder    S/P radiation therapy 01/26/14-02/02/14   sbrt  lt upper lung-54Gy/101fx   Stroke [facility])     Patient Active Problem List   Diagnosis Date Noted   Acute metabolic encephalopathy 25/36/6440   Chronic respiratory failure with hypoxia ([place]) 01/07/2021   AKI (acute kidney injury) ([place]) 01/03/2021   Brain metastasis ([place]) 05/22/2020   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   HIV (human immunodeficiency virus infection) ([place]) 01/02/2020   Brain mass 01/02/2020   OSA (obstructive sleep apnea) 01/02/2020   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   Cerebral edema (HCC)    Encephalopathy    HIV disease (HCC)     OSA (obstructive sleep apnea)    Dyslipidemia    History of CVA with residual deficit    Brain tumor ([place]) 11/28/2019   Change in mental status 11/21/2019   Bed bug bite 07/12/2019   DNR (do not resuscitate)    Altered mental status    Acute pulmonary embolism without acute cor pulmonale (HCC)    Acute ischemic right MCA stroke [facility])    Palliative care encounter    Aortic embolism or thrombosis ([place])    CVA (cerebral vascular accident) ([place]) 03/02/2019   COPD (chronic obstructive pulmonary disease) ([place]) 03/01/2019   PE (pulmonary thromboembolism) ([place]) 03/01/2019   Tobacco abuse 03/01/2019   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   CKD (chronic kidney disease), stage III ([place]) 03/01/2019   Generalized weakness 03/01/2019   Fall at home    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[facility])    Splenic infarct    Acute and chronic respiratory failure (acute-on-chronic) ([place]) 02/11/2019   CKD (chronic kidney disease) stage 3, GFR 30-59 ml/min ([place]) 08/12/2016   Lung nodule 10/12/2015   Bronchitis 09/09/2015   History of lung cancer 09/08/2015   Thoracic aorta atherosclerosis ([place]) 09/08/2015   Chronic obstructive pulmonary disease with (acute) exacerbation ([place]) 09/08/2015   Sepsis ([place]) 03/28/2015   CAP (community acquired pneumonia) 03/28/2015   UTI (lower urinary tract infection) 03/28/2015   Respiratory failure ([place]) 03/28/2015   Non-small cell lung cancer ([place])  Malignant neoplasm of upper lobe, bronchus or lung 01/03/2014   Acute hypoxemic respiratory failure ([place]) 10/31/2013   COPD with acute exacerbation ([place]) 10/30/2013   COPD exacerbation ([place]) 10/30/2013   Other and unspecified noninfectious gastroenteritis and colitis(558.9) 11/20/2012   Rectal bleed 11/19/2012   Environmental allergies 11/02/2012   Obesity 05/05/2012   Renal insufficiency 05/05/2012   Hyperglycemia 05/05/2012   Asthma 12/10/2011   Cigarette smoker 12/10/2011   Hypercholesteremia 12/11/2010   Hypertension 12/11/2010   ALLERGIC  RHINITIS, SEASONAL 07/03/2010   COPD 07/03/2010   HIV (human immunodeficiency virus infection) ([place]) 12/09/2006   DEPRESSION 12/09/2006   BREAST MASS, BENIGN 12/09/2006   OSTEOPOROSIS 12/09/2006    Past Surgical History:  Procedure Laterality Date   ABCESS DRAINAGE     abcess under Left arm, abcess from L breast   APPLICATION OF CRANIAL NAVIGATION Right 11/28/2019   Procedure: APPLICATION OF CRANIAL NAVIGATION;  Surgeon: [name], MD;  Location: [facility];  Service: Neurosurgery;  Laterality: Right;   BRAIN SURGERY     BUBBLE STUDY  03/08/2019   Procedure: BUBBLE STUDY;  Surgeon: [name], MD;  Location: [facility];  Service: Cardiovascular;;   CRANIOTOMY Right 11/28/2019   Procedure: Right Craniotomy for Tumor Resection;  Surgeon: [name], MD;  Location: [facility];  Service: Neurosurgery;  Laterality: Right;   CRANIOTOMY     removal of abnormal cells on uterus     TEE WITHOUT CARDIOVERSION N/A 03/08/2019   Procedure: TRANSESOPHAGEAL ECHOCARDIOGRAM (TEE);  Surgeon: [name], MD;  [facility];  Service: Cardiovascular;  Laterality: N/A;   thyroid gland removed       OB History   No obstetric history on file.     Family History  Problem Relation Age of Onset   Pneumonia Mother    Hypertension Sister    Diabetes Sister    Cancer Sister        pancreatic and lung   Cancer Brother        pancreatic   Cancer Brother        pancreatic    Social History   Tobacco Use   Smoking status: Never   Smokeless tobacco: Never   Tobacco comments:    quit date the day she starts her radiation at the [facility]  Substance Use Topics   Alcohol use: Not Currently    Comment: none in 20 years   Drug use: Not Currently    Comment: none in 20 years    Home Medications Prior to Admission medications   Medication Sig Start Date End Date Taking? Authorizing Provider  albuterol (PROVENTIL) (2.5 MG/3ML) 0.083% nebulizer solution Take 2.5 mg by  nebulization every 4 (four) hours as needed for wheezing.   Yes [provider]  albuterol (VENTOLIN HFA) 108 (90 Base) MCG/ACT inhaler Inhale 1 puff into the lungs every 6 (six) hours as needed for shortness of breath.  12/23/19  Yes [provider]  ammonium lactate (LAC-HYDRIN) 12 % lotion Apply 1 application topically in the morning and at bedtime.   Yes [provider]  apixaban (ELIQUIS) 5 MG TABS tablet Take 1 tablet (5 mg total) by mouth 2 (two) times daily. 03/13/19  Yes Hosie Poisson, MD  B Complex-Biotin-FA (VITAMIN B50 COMPLEX PO) Take 1 tablet by mouth every morning.   Yes [provider]  bictegravir-emtricitabine-tenofovir AF (BIKTARVY) 50-200-25 MG TABS tablet Take 1 tablet by mouth daily. 07/12/19  Yes Tommy Medal, Lavell Islam, MD  busPIRone (  BUSPAR) 5 MG tablet Take 2.5 mg by mouth 3 (three) times daily.   Yes [provider]  diclofenac Sodium (VOLTAREN) 1 % GEL Apply 2 g topically every 4 (four) hours as needed for pain. 11/09/19  Yes [provider]  Fluticasone-Salmeterol (ADVAIR) 500-50 MCG/DOSE AEPB Inhale 1 puff into the lungs every 12 (twelve) hours. 09/04/19  Yes [provider]  gabapentin (NEURONTIN) 800 MG tablet Take 800 mg by mouth 3 (three) times daily. 10/26/19  Yes [provider]  ibuprofen (ADVIL) 800 MG tablet Take 800 mg by mouth every 6 (six) hours as needed (pain).   Yes [provider]  Melatonin 3 MG CAPS Take 3 mg by mouth at bedtime.   Yes [provider]  montelukast (SINGULAIR) 10 MG tablet Take 10 mg by mouth at bedtime.  09/04/19  Yes [provider]  ondansetron (ZOFRAN ODT) 4 MG disintegrating tablet Take 1 tablet (4 mg total) by mouth every 8 (eight) hours as needed for nausea or vomiting. 01/26/21  Yes Venter, Margaux, PA-C  oxycodone (OXY-IR) 5 MG capsule Take 5 mg by mouth 3 (three) times daily. 01/19/21  Yes [provider]  OXYGEN Inhale 3 L into the  lungs continuous.   Yes [provider]  polyethylene glycol (MIRALAX / GLYCOLAX) 17 g packet Take 17 g by mouth 2 (two) times daily. Mix in 4-8 oz liquid and drink   Yes [provider]  QUEtiapine Fumarate (SEROQUEL XR) 150 MG 24 hr tablet Take 150 mg by mouth at bedtime. 11/09/19  Yes [provider]  rOPINIRole (REQUIP) 1 MG tablet Take 1 tablet (1 mg total) by mouth at bedtime. 05/20/19  Yes Ward, Delice Bison, DO  sennosides-docusate sodium (SENOKOT-S) 8.6-50 MG tablet Take 1 tablet by mouth at bedtime.   Yes [provider]  simethicone (MYLICON) 497 MG chewable tablet Chew 125 mg by mouth every 6 (six) hours as needed (bloating/gas).   Yes [provider]  Skin Protectants, Misc. (EUCERIN) cream Apply 1 application topically See admin instructions. Apply cream to the entire body after bath every day   Yes [provider]  SPIRIVA RESPIMAT 2.5 MCG/ACT AERS INHALE 2 PUFFS INTO THE LUNGS DAILY Patient taking differently: Inhale 2 puffs into the lungs every evening. 09/24/14  Yes Rigoberto Noel, MD  tiZANidine (ZANAFLEX) 4 MG tablet Take 4 mg by mouth every 12 (twelve) hours as needed (pain). 11/08/19  Yes [provider]  traMADol (ULTRAM) 50 MG tablet Take 1 tablet (50 mg total) by mouth in the morning, at noon, and at bedtime. Patient taking differently: Take 50 mg by mouth every 8 (eight) hours as needed (shoulder pain). 01/09/21  Yes Darliss Cheney, MD  venlafaxine XR (EFFEXOR-XR) 150 MG 24 hr capsule Take 150 mg by mouth every morning.   Yes [provider]  vitamin C (ASCORBIC ACID) 500 MG tablet Take 500 mg by mouth every morning.   Yes [provider]  oxyCODONE (OXY IR/ROXICODONE) 5 MG immediate release tablet Take 1 tablet (5 mg total) by mouth every 8 (eight) hours as needed for moderate pain. Patient not taking: No sig reported 01/09/21   Darliss Cheney, MD    Allergies    Patient has no known  allergies.  Review of Systems   Review of Systems  Constitutional: Negative.   HENT: Negative.    Respiratory: Negative.    Cardiovascular: Negative.   Gastrointestinal:  Positive for abdominal pain, diarrhea, nausea and vomiting.  Negative for blood in stool.  Genitourinary: Negative.   Musculoskeletal: Negative.   Skin: Negative.   Neurological: Negative.    Physical Exam Updated Vital Signs BP (!) 102/55   Pulse 80   Temp 98.1 F (36.7 C) (Oral)   Resp 15   SpO2 96%   Physical Exam Vitals and nursing note reviewed. Exam conducted with a chaperone present.  Constitutional:      Appearance: She is obese. She is not ill-appearing or toxic-appearing.  HENT:     Head: Normocephalic and atraumatic.     Nose: Nose normal.     Mouth/Throat:     Mouth: Mucous membranes are moist.     Pharynx: Oropharynx is clear. Uvula midline. No oropharyngeal exudate or posterior oropharyngeal erythema.     Tonsils: No tonsillar exudate.  Eyes:     General: Lids are normal. Vision grossly intact.        Right eye: No discharge.        Left eye: No discharge.     Extraocular Movements: Extraocular movements intact.     Conjunctiva/sclera: Conjunctivae normal.     Pupils: Pupils are equal, round, and reactive to light.  Neck:     Trachea: Trachea and phonation normal.  Cardiovascular:     Rate and Rhythm: Normal rate and regular rhythm.     Pulses: Normal pulses.     Heart sounds: Normal heart sounds. No murmur heard. Pulmonary:     Effort: Pulmonary effort is normal. No tachypnea, bradypnea, accessory muscle usage, prolonged expiration or respiratory distress.     Breath sounds: Normal breath sounds. No wheezing or rales.  Chest:     Chest wall: No mass, lacerations, deformity, swelling, tenderness, crepitus or edema.  Abdominal:     General: Bowel sounds are normal. There is no distension.     Palpations: Abdomen is soft.     Tenderness: There is no abdominal tenderness. There is no  right CVA tenderness, left CVA tenderness, guarding or rebound.  Genitourinary:    Rectum: Guaiac result positive. No tenderness, external hemorrhoid or internal hemorrhoid.     Comments: Non melanotic appearing stool leaking from the anus.  Musculoskeletal:        General: No deformity.     Cervical back: Normal range of motion and neck supple. No edema, rigidity or crepitus. No pain with movement or spinous process tenderness.     Right lower leg: No edema.     Left lower leg: No edema.  Lymphadenopathy:     Cervical: No cervical adenopathy.  Skin:    General: Skin is warm and dry.  Neurological:     Mental Status: She is alert. Mental status is at baseline.  Psychiatric:        Mood and Affect: Mood normal.    ED Results / Procedures / Treatments   Labs (all labs ordered are listed, but only abnormal results are displayed) Labs Reviewed  COMPREHENSIVE METABOLIC PANEL - Abnormal; Notable for the following components:      Result Value   Creatinine, Ser 1.13 (*)    Total Protein 8.9 (*)    GFR, Estimated 51 (*)    All other components within normal limits  CBC WITH DIFFERENTIAL/PLATELET - Abnormal; Notable for the following components:   Hemoglobin 11.0 (*)    MCHC 28.5 (*)    RDW 16.3 (*)    All other components within normal limits  PROTIME-INR - Abnormal; Notable for the following components:  Prothrombin Time 17.6 (*)    INR 1.5 (*)    All other components within normal limits  APTT - Abnormal; Notable for the following components:   aPTT 47 (*)    All other components within normal limits  URINALYSIS, ROUTINE W REFLEX MICROSCOPIC - Abnormal; Notable for the following components:   APPearance HAZY (*)    Specific Gravity, Urine >1.046 (*)    All other components within normal limits  POC OCCULT BLOOD, ED - Abnormal; Notable for the following components:   Fecal Occult Bld POSITIVE (*)    All other components within normal limits  RESP PANEL BY RT-PCR (FLU A&B,  COVID) ARPGX2  LIPASE, BLOOD  LACTIC ACID, PLASMA  LACTIC ACID, PLASMA    EKG EKG Interpretation  Date/Time:  Sunday January 26 2021 13:23:24 EDT Ventricular Rate:  78 PR Interval:  196 QRS Duration: 88 QT Interval:  395 QTC Calculation: 450 R Axis:   0 Text Interpretation: Sinus rhythm Borderline T abnormalities, diffuse leads since last tracing no significant change Confirmed by Wentz, Elliott ([id number]) on 01/26/2021 1:27:01 PM  Radiology  DG Chest Port 1 View  Result Date: 01/26/2021 CLINICAL DATA:  Questionable sepsis.  Evaluate for abnormality. EXAM: PORTABLE CHEST 1 VIEW COMPARISON:  January 03, 2021 FINDINGS: Streaky opacity in the left mid lung correlates with atelectasis or scar seen on CT imaging from January 02, 2020 and is unchanged since the January 03, 2021 chest x-ray. The left lung is otherwise clear. Prominence of the right hilum is probably due to patient rotation and vascular crowding. The cardiomediastinal silhouette is otherwise unchanged and unremarkable. No pneumothorax. No evidence of pneumonia. IMPRESSION: No cause for sepsis identified. Electronically Signed   By: David  Williams III M.D   On: 01/26/2021 14:24    Procedures Procedures   Medications Ordered in ED Medications  pantoprazole (PROTONIX) injection 40 mg (40 mg Intravenous Given 01/26/21 1355)  ondansetron (ZOFRAN) injection 4 mg (4 mg Intravenous Given 01/26/21 1355)  iohexol (OMNIPAQUE) 350 MG/ML injection 80 mL (80 mLs Intravenous Contrast Given 01/26/21 1505)  sodium chloride 0.9 % bolus 1,000 mL (0 mLs Intravenous Stopped 01/26/21 1722)    ED Course  I have reviewed the triage vital signs and the nursing notes.  Pertinent labs & imaging results that were available during my care of the patient were reviewed by me and considered in my medical decision making (see chart for details).    MDM Rules/Calculators/A&P                         75  year old female who presents for nursing facility with concern for  generalized abdominal pain, nausea, and diarrhea that started yesterday.  Differential diagnosis for this patient includes but is not limited to gastroenteritis, COVID-19/influenza, bowel obstruction, diverticulitis, AAA, pancreatitis, biliary obstruction, cholelithiasis/cholecystitis/cholangitis, gastroparesis, GERD, gastritis.  Vital signs are normal on intake.  Cardiopulmonary exam is normal, abdominal exam is benign.  GU exam performed in the presence of chaperone revealed loose but nonmelanotic appearing stool coming from the rectum.  Rectal exam without palpable mass.  Patient is neurovascular intact in all 4 extremities and endorses being asymptomatic at this time.  CBC with  baseline anemia with hemoglobin 11 INR 1.5.  On Eliquis.  CMP with mild AKI creatinine 1.1 with patient's baseline of 1 lactic is normal.  Patient been administered a fluid bolus and Protonix, remains well-appearing at this time.  Care of this patient signed out to [facility]  Alroy Bailiff, PA-C at time of shift change.  All pertinent HPI, physical exam, and laboratory findings were discussed with her prior to her doctor.  Patient is pending CT scan at this time.  Given reassuring hemoglobin and no melanotic appearing stool, if patient CT scan is reassuring, she may be discharged home to her facility.  Zyona voiced understanding of her medical evaluation and treatment plan thus far.  Each of her questions was answered to her expressed satisfaction.  She is agreeable to remaining in the emergency department for completion of work-up.   This chart was dictated using voice recognition software, Dragon. Despite the best efforts of this provider to proofread and correct errors, errors may still occur which can change documentation meaning.  Final Clinical Impression(s) / ED Diagnoses Final diagnoses:  Generalized abdominal pain  Non-intractable vomiting with nausea, unspecified vomiting type    Rx / DC Orders ED Discharge Orders           Ordered    ondansetron (ZOFRAN ODT) 4 MG disintegrating tablet  Every 8 hours PRN        01/26/21 [address], Gypsy Balsam, PA-C 01/27/21 1150    Daleen Bo, MD 01/27/21 [phone]

## 2021-01-26 NOTE — ED Provider Notes (Signed)
Care assumed from [facility], PA-C, at shift change, please see their notes for full documentation of patient's complaint/HPI. Briefly, pt here with abdominal pain,  nausea, vomiting. Results so far show reassuring work up, CT scan negative. Awaiting U/A. Plan is to dispo back to facility.   Physical Exam  BP 111/63   Pulse 78   Temp 98.1 F (36.7 C) (Oral)   Resp 15   SpO2 100%   Physical Exam Vitals and nursing note reviewed.  Constitutional:      Appearance: She is not ill-appearing.  HENT:     Head: Normocephalic and atraumatic.  Eyes:     Conjunctiva/sclera: Conjunctivae normal.  Cardiovascular:     Rate and Rhythm: Normal rate and regular rhythm.  Pulmonary:     Effort: Pulmonary effort is normal.     Breath sounds: Normal breath sounds.  Skin:    General: Skin is warm and dry.     Coloration: Skin is not jaundiced.  Neurological:     Mental Status: She is alert.    ED Course/Procedures     Procedures  Results for orders placed or performed during the hospital encounter of 01/26/21  Lipase, blood  Result Value Ref Range   Lipase 22 11 - 51 U/L  Lactic acid, plasma  Result Value Ref Range   Lactic Acid, Venous 1.2 0.5 - 1.9 mmol/L  Lactic acid, plasma  Result Value Ref Range   Lactic Acid, Venous 0.7 0.5 - 1.9 mmol/L  Comprehensive metabolic panel  Result Value Ref Range   Sodium 139 135 - 145 mmol/L   Potassium 4.1 3.5 - 5.1 mmol/L   Chloride 102 98 - 111 mmol/L   CO2 25 22 - 32 mmol/L   Glucose, Bld 83 70 - 99 mg/dL   BUN 13 8 - 23 mg/dL   Creatinine, Ser 1.13 (H) 0.44 - 1.00 mg/dL   Calcium 8.9 8.9 - 10.3 mg/dL   Total Protein 8.9 (H) 6.5 - 8.1 g/dL   Albumin 4.0 3.5 - 5.0 g/dL   AST 19 15 - 41 U/L   ALT 12 0 - 44 U/L   Alkaline Phosphatase 62 38 - 126 U/L   Total Bilirubin 0.5 0.3 - 1.2 mg/dL   GFR, Estimated 51 (L) >60 mL/min   Anion gap 12 5 - 15  CBC WITH DIFFERENTIAL  Result Value Ref Range   WBC 5.9 4.0 - 10.5 K/uL   RBC 4.10  3.87 - 5.11 MIL/uL   Hemoglobin 11.0 (L) 12.0 - 15.0 g/dL   HCT 38.6 36.0 - 46.0 %   MCV 94.1 80.0 - 100.0 fL   MCH 26.8 26.0 - 34.0 pg   MCHC 28.5 (L) 30.0 - 36.0 g/dL   RDW 16.3 (H) 11.5 - 15.5 %   Platelets 243 150 - 400 K/uL   nRBC 0.0 0.0 - 0.2 %   Neutrophils Relative % 51 %   Neutro Abs 3.0 1.7 - 7.7 K/uL   Lymphocytes Relative 39 %   Lymphs Abs 2.3 0.7 - 4.0 K/uL   Monocytes Relative 8 %   Monocytes Absolute 0.5 0.1 - 1.0 K/uL   Eosinophils Relative 2 %   Eosinophils Absolute 0.1 0.0 - 0.5 K/uL   Basophils Relative 0 %   Basophils Absolute 0.0 0.0 - 0.1 K/uL   Immature Granulocytes 0 %   Abs Immature Granulocytes 0.01 0.00 - 0.07 K/uL  Protime-INR  Result Value Ref Range   Prothrombin Time 17.6 (H) 11.4 -  15.2 seconds   INR 1.5 (H) 0.8 - 1.2  APTT  Result Value Ref Range   aPTT 47 (H) 24 - 36 seconds  Urinalysis, Routine w reflex microscopic  Result Value Ref Range   Color, Urine YELLOW YELLOW   APPearance HAZY (A) CLEAR   Specific Gravity, Urine >1.046 (H) 1.005 - 1.030   pH 5.0 5.0 - 8.0   Glucose, UA NEGATIVE NEGATIVE mg/dL   Hgb urine dipstick NEGATIVE NEGATIVE   Bilirubin Urine NEGATIVE NEGATIVE   Ketones, ur NEGATIVE NEGATIVE mg/dL   Protein, ur NEGATIVE NEGATIVE mg/dL   Nitrite NEGATIVE NEGATIVE   Leukocytes,Ua NEGATIVE NEGATIVE  POC occult blood, ED  Result Value Ref Range   Fecal Occult Bld POSITIVE (A) NEGATIVE   CT Abdomen Pelvis W Contrast  Result Date: 01/26/2021 CLINICAL DATA:  Left lower quadrant pain and nausea beginning yesterday. EXAM: CT ABDOMEN AND PELVIS WITH CONTRAST TECHNIQUE: Multidetector CT imaging of the abdomen and pelvis was performed using the standard protocol following bolus administration of intravenous contrast. CONTRAST:  6mL OMNIPAQUE IOHEXOL 350 MG/ML SOLN COMPARISON:  Noncontrast CT on 01/03/2021 FINDINGS: Lower Chest: No acute findings. Hepatobiliary: No hepatic masses identified. Gallbladder is unremarkable. No evidence  of biliary ductal dilatation. Pancreas: Tiny low attenuation lesion in the pancreatic tail measuring 11 mm on image 23/2 remains stable since prior contrast enhanced exams dating back to 11/19/2012. No other pancreatic lesions identified. No evidence of pancreatic ductal dilatation or peripancreatic inflammatory changes. Spleen: Within normal limits in size and appearance. Adrenals/Urinary Tract: No masses identified. No evidence of ureteral calculi or hydronephrosis. Stomach/Bowel: No evidence of obstruction, inflammatory process or abnormal fluid collections. Normal appendix visualized. Previously seen fecal impaction in the rectum is no longer visualized. Vascular/Lymphatic: No pathologically enlarged lymph nodes. No acute vascular findings. Aortic atherosclerotic calcification noted. Reproductive: 1.5 cm subserosal partially calcified fibroid is again seen arising from the uterine fundus, stable since previous study. Adnexal regions are unremarkable. Other:  None. Musculoskeletal:  No suspicious bone lesions identified. IMPRESSION: No acute findings within the abdomen or pelvis. Stable small uterine fibroid. 11 mm low-attenuation lesion in pancreatic tail has been stable compared to previous studies dating back to 2014. Continued imaging follow-up is recommended by CT or MRI in 2 years, to document stability over a 10 year time period from original exam. This recommendation follows ACR consensus guidelines: Management of Incidental Pancreatic Cysts: A White Paper of the ACR Incidental Findings Committee. [place] [id number];70:[id number]. Aortic Atherosclerosis (ICD10-I70.0). Electronically Signed   By: Marlaine Hind M.D.   On: 01/26/2021 15:42   DG Chest Port 1 View  Result Date: 01/26/2021 CLINICAL DATA:  Questionable sepsis.  Evaluate for abnormality. EXAM: PORTABLE CHEST 1 VIEW COMPARISON:  January 03, 2021 FINDINGS: Streaky opacity in the left mid lung correlates with atelectasis or scar seen on CT imaging  from January 02, 2020 and is unchanged since the January 03, 2021 chest x-ray. The left lung is otherwise clear. Prominence of the right hilum is probably due to patient rotation and vascular crowding. The cardiomediastinal silhouette is otherwise unchanged and unremarkable. No pneumothorax. No evidence of pneumonia. IMPRESSION: No cause for sepsis identified. Electronically Signed   By: Dorise Bullion III M.D   On: 01/26/2021 14:24    MDM  U/A negative in the ED today. Pt has not vomited since she has been here; comfortable appearing. Stable for discharge back to facility.   This note was prepared using Systems analyst and  may include unintentional dictation errors due to the inherent limitations of voice recognition software.        Eustaquio Maize, PA-C 01/26/21 1919    Drenda Freeze, MD 01/26/21 2250

## 2021-01-26 NOTE — Discharge Instructions (Addendum)
Your workup was reassuring in the ED without any abnormalities on CT scan.  Please take Zofran as needed for vomiting.  Follow up with PCP regarding ED visit today

## 2021-01-26 NOTE — ED Notes (Signed)
Urine has been sent down to lab.

## 2021-01-26 NOTE — ED Notes (Signed)
Awaiting transportation back to facility

## 2021-01-26 NOTE — ED Triage Notes (Signed)
Patient coming from nursing home c/o abdominal pain and nausea that started yesterday.

## 2021-02-03 ENCOUNTER — Other Ambulatory Visit: Payer: Self-pay

## 2021-02-03 ENCOUNTER — Ambulatory Visit (HOSPITAL_COMMUNITY)
Admission: RE | Admit: 2021-02-03 | Discharge: 2021-02-03 | Disposition: A | Discharge status: Home / Self Care | Payer: Medicare HMO | Source: Ambulatory Visit | Attending: Internal Medicine | Admitting: Internal Medicine

## 2021-02-03 DIAGNOSIS — C7931 Secondary malignant neoplasm of brain: Secondary | ICD-10-CM

## 2021-02-04 ENCOUNTER — Ambulatory Visit (HOSPITAL_COMMUNITY): Admission: RE | Admit: 2021-02-04 | Payer: Medicare HMO | Source: Ambulatory Visit

## 2021-02-04 ENCOUNTER — Encounter (HOSPITAL_COMMUNITY): Payer: Self-pay

## 2021-02-04 ENCOUNTER — Other Ambulatory Visit: Payer: Self-pay | Admitting: Radiation Therapy

## 2021-02-06 ENCOUNTER — Telehealth: Payer: Self-pay | Admitting: *Deleted

## 2021-02-06 NOTE — Telephone Encounter (Signed)
Spoke with [name] she will try to get someone to attend the MRI with her if she agrees to lay flat.  She will also need to reschedule visit with [facility] until after MRI is completed.

## 2021-02-06 NOTE — Telephone Encounter (Signed)
Spoke with [facility] and they advised that the patient could not complete her MRI as she states she couldn't lay flat on the table and needed pain medication.  States the facility gave her some medicine but it wasn't enough.  Radiology didn't proceed with getting patient out of wheelchair with lift because she declined getting on the table and needed medicine.  They also didn't proceed because patient was medicated prior to leaving facility and was without any medical personnel.  They require someone to be with a patient if they are medicated.  Attempted to call [place] to [facility] [place] [facility] [facility] and reschedule visit with Dr Mickeal Skinner.    [facility] (who schedules resident medical appts).  Unable to reach [place] to reschedule.    FOLLOW UP NEEDED on 02/07/2021

## 2021-02-10 ENCOUNTER — Inpatient Hospital Stay: Payer: Medicare HMO

## 2021-02-10 ENCOUNTER — Inpatient Hospital Stay: Payer: Medicare HMO | Admitting: Internal Medicine

## 2021-02-17 ENCOUNTER — Emergency Department (HOSPITAL_COMMUNITY): Payer: Medicare HMO

## 2021-02-17 ENCOUNTER — Encounter (HOSPITAL_COMMUNITY): Payer: Self-pay

## 2021-02-17 ENCOUNTER — Inpatient Hospital Stay (HOSPITAL_COMMUNITY)
Admission: EM | Admit: 2021-02-17 | Discharge: 2021-02-22 | DRG: 871 | Disposition: A | Discharge status: Skilled Nursing Facility | Payer: Medicare HMO | Source: Skilled Nursing Facility | Attending: Internal Medicine | Admitting: Internal Medicine

## 2021-02-17 DIAGNOSIS — C7931 Secondary malignant neoplasm of brain: Secondary | ICD-10-CM | POA: Diagnosis present

## 2021-02-17 DIAGNOSIS — N3000 Acute cystitis without hematuria: Secondary | ICD-10-CM

## 2021-02-17 DIAGNOSIS — Z923 Personal history of irradiation: Secondary | ICD-10-CM

## 2021-02-17 DIAGNOSIS — G629 Polyneuropathy, unspecified: Secondary | ICD-10-CM | POA: Diagnosis present

## 2021-02-17 DIAGNOSIS — Z79899 Other long term (current) drug therapy: Secondary | ICD-10-CM

## 2021-02-17 DIAGNOSIS — G9341 Metabolic encephalopathy: Secondary | ICD-10-CM | POA: Diagnosis present

## 2021-02-17 DIAGNOSIS — Z85118 Personal history of other malignant neoplasm of bronchus and lung: Secondary | ICD-10-CM

## 2021-02-17 DIAGNOSIS — Z8249 Family history of ischemic heart disease and other diseases of the circulatory system: Secondary | ICD-10-CM

## 2021-02-17 DIAGNOSIS — I69354 Hemiplegia and hemiparesis following cerebral infarction affecting left non-dominant side: Secondary | ICD-10-CM

## 2021-02-17 DIAGNOSIS — J9611 Chronic respiratory failure with hypoxia: Secondary | ICD-10-CM | POA: Diagnosis present

## 2021-02-17 DIAGNOSIS — Z9981 Dependence on supplemental oxygen: Secondary | ICD-10-CM

## 2021-02-17 DIAGNOSIS — Z7401 Bed confinement status: Secondary | ICD-10-CM

## 2021-02-17 DIAGNOSIS — Z66 Do not resuscitate: Secondary | ICD-10-CM | POA: Diagnosis present

## 2021-02-17 DIAGNOSIS — Z86711 Personal history of pulmonary embolism: Secondary | ICD-10-CM

## 2021-02-17 DIAGNOSIS — J441 Chronic obstructive pulmonary disease with (acute) exacerbation: Secondary | ICD-10-CM | POA: Diagnosis present

## 2021-02-17 DIAGNOSIS — Z833 Family history of diabetes mellitus: Secondary | ICD-10-CM

## 2021-02-17 DIAGNOSIS — Z20822 Contact with and (suspected) exposure to covid-19: Secondary | ICD-10-CM | POA: Diagnosis present

## 2021-02-17 DIAGNOSIS — Z7901 Long term (current) use of anticoagulants: Secondary | ICD-10-CM

## 2021-02-17 DIAGNOSIS — D6959 Other secondary thrombocytopenia: Secondary | ICD-10-CM | POA: Diagnosis present

## 2021-02-17 DIAGNOSIS — E785 Hyperlipidemia, unspecified: Secondary | ICD-10-CM | POA: Diagnosis present

## 2021-02-17 DIAGNOSIS — Z7951 Long term (current) use of inhaled steroids: Secondary | ICD-10-CM

## 2021-02-17 DIAGNOSIS — R6520 Severe sepsis without septic shock: Secondary | ICD-10-CM | POA: Diagnosis present

## 2021-02-17 DIAGNOSIS — A419 Sepsis, unspecified organism: Secondary | ICD-10-CM | POA: Diagnosis present

## 2021-02-17 DIAGNOSIS — G2581 Restless legs syndrome: Secondary | ICD-10-CM | POA: Diagnosis present

## 2021-02-17 DIAGNOSIS — N179 Acute kidney failure, unspecified: Secondary | ICD-10-CM | POA: Diagnosis present

## 2021-02-17 DIAGNOSIS — Z801 Family history of malignant neoplasm of trachea, bronchus and lung: Secondary | ICD-10-CM

## 2021-02-17 DIAGNOSIS — A411 Sepsis due to other specified staphylococcus: Principal | ICD-10-CM | POA: Diagnosis present

## 2021-02-17 DIAGNOSIS — Z21 Asymptomatic human immunodeficiency virus [HIV] infection status: Secondary | ICD-10-CM | POA: Diagnosis present

## 2021-02-17 DIAGNOSIS — R4182 Altered mental status, unspecified: Secondary | ICD-10-CM | POA: Diagnosis not present

## 2021-02-17 DIAGNOSIS — G9340 Encephalopathy, unspecified: Secondary | ICD-10-CM | POA: Diagnosis present

## 2021-02-17 DIAGNOSIS — F39 Unspecified mood [affective] disorder: Secondary | ICD-10-CM | POA: Diagnosis present

## 2021-02-17 DIAGNOSIS — R042 Hemoptysis: Secondary | ICD-10-CM | POA: Diagnosis present

## 2021-02-17 DIAGNOSIS — B20 Human immunodeficiency virus [HIV] disease: Secondary | ICD-10-CM | POA: Diagnosis present

## 2021-02-17 DIAGNOSIS — N390 Urinary tract infection, site not specified: Secondary | ICD-10-CM | POA: Diagnosis present

## 2021-02-17 MED ORDER — LACTATED RINGERS IV SOLN: INTRAVENOUS | Status: DC

## 2021-02-17 MED ORDER — LACTATED RINGERS IV BOLUS (SEPSIS)
2000.0000 mL | Freq: Once | INTRAVENOUS | Status: AC
  Administered 2021-02-17: 2000 mL via INTRAVENOUS

## 2021-02-17 MED ORDER — CEFTRIAXONE SODIUM 2 G IJ SOLR
2.0000 g | Freq: Once | INTRAMUSCULAR | Status: AC
Start: 2021-02-17 — End: 2021-02-18
  Administered 2021-02-17: 2 g via INTRAVENOUS
  Filled 2021-02-17: qty 20

## 2021-02-17 NOTE — ED Triage Notes (Signed)
Pt bib [place] [facility] from [facility] with altered mental status. Pt oriented to self, hx of UTIs and is hot to the touch. BP per EMS was 101/56. Pt 88% on 3L [place], 100% 8L [facility]. CBG 124.

## 2021-02-17 NOTE — Sepsis Progress Note (Signed)
[facility] following for sepsis protocol. 

## 2021-02-17 NOTE — ED Provider Notes (Signed)
[facility] Emergency Department Provider Note MRN:  [id number]  Arrival date & time: 02/18/21     Chief Complaint   Fever History of Present Illness   [name] is a 75 y.o. year-old female with a history of HIV, lung cancer, stroke presenting to the ED with chief complaint of fever.  Patient arriving with fever, report of altered mental status at her rehab facility.  History of UTIs.  On increased level of oxygen.  Patient denies any pain at this time, has been having some burning with urination.  Denies chest pain, no abdominal pain.  Review of Systems  Positive for fever, altered mental status.  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   Neuromuscular disorder (HCC)    fingers/feet neuropathy   Non-small cell lung cancer ([place])    Renal disorder    S/P radiation therapy 01/26/14-02/02/14   sbrt  lt upper lung-54Gy/58fx   Stroke [facility])     Past Surgical History:  Procedure Laterality Date   ABCESS DRAINAGE     abcess under Left arm, abcess from L breast   APPLICATION OF CRANIAL NAVIGATION Right 11/28/2019   Procedure: APPLICATION OF CRANIAL NAVIGATION;  Surgeon: [name], MD;  Location: [place];  Service: Neurosurgery;  Laterality: Right;   BRAIN SURGERY     BUBBLE STUDY  03/08/2019   Procedure: BUBBLE STUDY;  Surgeon: [name], MD;  Location: [place];  Service: Cardiovascular;;   CRANIOTOMY Right 11/28/2019   Procedure: Right Craniotomy for Tumor Resection;  Surgeon: [name], MD;  Location: [place];  Service: Neurosurgery;  Laterality: Right;   CRANIOTOMY     removal of abnormal cells on uterus     TEE WITHOUT CARDIOVERSION N/A 03/08/2019   Procedure: TRANSESOPHAGEAL ECHOCARDIOGRAM (TEE);  Surgeon: [name], MD;  Location: [facility] ENDOSCOPY;  Service: Cardiovascular;  Laterality: N/A;   thyroid gland removed      Family History  Problem Relation Age of Onset   Pneumonia Mother    Hypertension Sister    Diabetes Sister    Cancer Sister        pancreatic and lung   Cancer Brother        pancreatic   Cancer Brother        pancreatic    Social History   Socioeconomic History   Marital status: Single    Spouse name: Not on file   Number of children: Not on file   Years of education: Not on file   Highest education level: Not on file  Occupational History   Not on file  Tobacco Use   Smoking status: Never   Smokeless tobacco: Never   Tobacco comments:    quit date the day she starts her radiation at the [place]  Substance and Sexual Activity   Alcohol use: Not Currently    Comment: none in 20 years   Drug use: Not Currently    Comment: none in 20  years   Sexual activity: Not Currently    Partners: Male    Comment: given condoms  Other Topics Concern   Not on file  Social History Narrative   ** Merged History Encounter **       Social Determinants of Health   Financial Resource Strain: Not on file  Food Insecurity: Not on file  Transportation Needs: Not on file  Physical Activity: Not on file  Stress: Not on file  Social Connections: Not on file  Intimate Partner Violence: Not on file     Physical Exam   Vitals:   02/18/21 0115 02/18/21 0130  BP: 108/77 118/61  Pulse: 98 81  Resp: (!) 27 15  Temp:    SpO2: 97% 100%    CONSTITUTIONAL: Chronically ill-appearing, NAD NEURO:  Alert and oriented to name, answers questions, follows commands, left-sided deficits noted EYES:  eyes equal and reactive ENT/NECK:  no LAD, no JVD CARDIO: Regular  rate, well-perfused, normal S1 and S2 PULM:  CTAB no wheezing or rhonchi GI/GU:  normal bowel sounds, non-distended, non-tender MSK/SPINE:  No gross deformities, no edema SKIN:  no rash, atraumatic PSYCH:  Appropriate speech and behavior  *Additional and/or pertinent findings included in MDM below  Diagnostic and Interventional Summary    EKG Interpretation  Date/Time:  Monday February 17 2021 22:31:50 EDT Ventricular Rate:  94 PR Interval:  189 QRS Duration: 87 QT Interval:  342 QTC Calculation: 428 R Axis:   38 Text Interpretation: Sinus rhythm Borderline T abnormalities, anterior leads Confirmed by Gerlene Fee [phone]) on 02/17/2021 11:42:03 PM       Labs Reviewed  COMPREHENSIVE METABOLIC PANEL - Abnormal; Notable for the following components:      Result Value   Glucose, Bld 107 (*)    Creatinine, Ser 1.49 (*)    Albumin 3.4 (*)    GFR, Estimated 37 (*)    All other components within normal limits  PROTIME-INR - Abnormal; Notable for the following components:   Prothrombin Time 16.7 (*)    INR 1.4 (*)    All other components within normal limits  APTT - Abnormal; Notable for the following components:   aPTT 37 (*)    All other components within normal limits  URINALYSIS, ROUTINE W REFLEX MICROSCOPIC - Abnormal; Notable for the following components:   APPearance CLOUDY (*)    Hgb urine dipstick MODERATE (*)    Protein, ur 30 (*)    Nitrite POSITIVE (*)    Leukocytes,Ua LARGE (*)    WBC, UA >50 (*)    Bacteria, UA FEW (*)    All other components within normal limits  RESP PANEL BY RT-PCR (FLU A&B, COVID) ARPGX2  CULTURE, BLOOD (ROUTINE X 2)  CULTURE, BLOOD (ROUTINE X 2)  URINE CULTURE  LACTIC ACID, PLASMA  LACTIC ACID, PLASMA  CBC WITH DIFFERENTIAL/PLATELET  CBC WITH DIFFERENTIAL/PLATELET    DG Chest Port 1 View  Final Result      Medications  lactated ringers infusion ( Intravenous New Bag/Given 02/18/21 0106)  lactated ringers bolus 2,000 mL (0 mLs  Intravenous Stopped 02/18/21 0106)  cefTRIAXone (ROCEPHIN) 2 g in sodium chloride 0.9 % 100 mL IVPB (0 g Intravenous Stopped 02/18/21 0106)     Procedures  /  Critical Care .Critical Care  Date/Time: 02/17/2021 11:39 PM Performed by: Maudie Flakes, MD Authorized by: Maudie Flakes, MD   Critical care provider statement:    Critical care time (minutes):  35   Critical care  was necessary to treat or prevent imminent or life-threatening deterioration of the following conditions:  Sepsis   Critical care was time spent personally by me on the following activities:  Discussions with consultants, evaluation of patient's response to treatment, examination of patient, ordering and performing treatments and interventions, ordering and review of laboratory studies, ordering and review of radiographic studies, pulse oximetry, re-evaluation of patient's condition, obtaining history from patient or surrogate and review of old charts  ED Course and Medical Decision Making  I have reviewed the triage vital signs, the nursing notes, and pertinent available records from the EMR.  Listed above are laboratory and imaging tests that I personally ordered, reviewed, and interpreted and then considered in my medical decision making (see below for details).  Concern for sepsis given patient's fever, report of altered mental status, report of dysuria.  HIV history, cancer history.  She is awake and conversant, unsure of her cognitive baseline but does not seem overtly altered on my exam.  Blood pressure is a bit soft, providing empiric fluids, antibiotics, awaiting labs, imaging.     Urine showing evidence of infection, will admit to medicine.  Barth Kirks. Sedonia Small, MD [place] [email or web address]  Final Clinical Impressions(s) / ED Diagnoses     ICD-10-CM   1. Acute cystitis without hematuria  N30.00       ED Discharge Orders     None        Discharge  Instructions Discussed with and Provided to Patient:   Discharge Instructions   None       Maudie Flakes, MD 02/18/21 [phone]

## 2021-02-18 ENCOUNTER — Observation Stay (HOSPITAL_COMMUNITY): Payer: Medicare HMO

## 2021-02-18 ENCOUNTER — Encounter (HOSPITAL_COMMUNITY): Payer: Self-pay | Admitting: Internal Medicine

## 2021-02-18 ENCOUNTER — Other Ambulatory Visit: Payer: Self-pay

## 2021-02-18 DIAGNOSIS — C7931 Secondary malignant neoplasm of brain: Secondary | ICD-10-CM | POA: Diagnosis present

## 2021-02-18 DIAGNOSIS — N179 Acute kidney failure, unspecified: Secondary | ICD-10-CM | POA: Diagnosis present

## 2021-02-18 DIAGNOSIS — N390 Urinary tract infection, site not specified: Secondary | ICD-10-CM | POA: Diagnosis present

## 2021-02-18 DIAGNOSIS — Z9981 Dependence on supplemental oxygen: Secondary | ICD-10-CM | POA: Diagnosis not present

## 2021-02-18 DIAGNOSIS — Z833 Family history of diabetes mellitus: Secondary | ICD-10-CM | POA: Diagnosis not present

## 2021-02-18 DIAGNOSIS — D6959 Other secondary thrombocytopenia: Secondary | ICD-10-CM | POA: Diagnosis present

## 2021-02-18 DIAGNOSIS — J441 Chronic obstructive pulmonary disease with (acute) exacerbation: Secondary | ICD-10-CM | POA: Diagnosis present

## 2021-02-18 DIAGNOSIS — Z66 Do not resuscitate: Secondary | ICD-10-CM | POA: Diagnosis present

## 2021-02-18 DIAGNOSIS — Z85118 Personal history of other malignant neoplasm of bronchus and lung: Secondary | ICD-10-CM | POA: Diagnosis not present

## 2021-02-18 DIAGNOSIS — Z21 Asymptomatic human immunodeficiency virus [HIV] infection status: Secondary | ICD-10-CM | POA: Diagnosis present

## 2021-02-18 DIAGNOSIS — E785 Hyperlipidemia, unspecified: Secondary | ICD-10-CM | POA: Diagnosis present

## 2021-02-18 DIAGNOSIS — Z8249 Family history of ischemic heart disease and other diseases of the circulatory system: Secondary | ICD-10-CM | POA: Diagnosis not present

## 2021-02-18 DIAGNOSIS — G2581 Restless legs syndrome: Secondary | ICD-10-CM | POA: Diagnosis present

## 2021-02-18 DIAGNOSIS — G9340 Encephalopathy, unspecified: Secondary | ICD-10-CM

## 2021-02-18 DIAGNOSIS — Z923 Personal history of irradiation: Secondary | ICD-10-CM | POA: Diagnosis not present

## 2021-02-18 DIAGNOSIS — Z7901 Long term (current) use of anticoagulants: Secondary | ICD-10-CM | POA: Diagnosis not present

## 2021-02-18 DIAGNOSIS — N3000 Acute cystitis without hematuria: Secondary | ICD-10-CM | POA: Diagnosis not present

## 2021-02-18 DIAGNOSIS — I69354 Hemiplegia and hemiparesis following cerebral infarction affecting left non-dominant side: Secondary | ICD-10-CM | POA: Diagnosis not present

## 2021-02-18 DIAGNOSIS — G9341 Metabolic encephalopathy: Secondary | ICD-10-CM | POA: Diagnosis present

## 2021-02-18 DIAGNOSIS — Z20822 Contact with and (suspected) exposure to covid-19: Secondary | ICD-10-CM | POA: Diagnosis present

## 2021-02-18 DIAGNOSIS — Z79899 Other long term (current) drug therapy: Secondary | ICD-10-CM | POA: Diagnosis not present

## 2021-02-18 DIAGNOSIS — J9611 Chronic respiratory failure with hypoxia: Secondary | ICD-10-CM | POA: Diagnosis present

## 2021-02-18 DIAGNOSIS — Z801 Family history of malignant neoplasm of trachea, bronchus and lung: Secondary | ICD-10-CM | POA: Diagnosis not present

## 2021-02-18 DIAGNOSIS — A411 Sepsis due to other specified staphylococcus: Secondary | ICD-10-CM | POA: Diagnosis present

## 2021-02-18 DIAGNOSIS — R4182 Altered mental status, unspecified: Secondary | ICD-10-CM | POA: Diagnosis present

## 2021-02-18 DIAGNOSIS — R042 Hemoptysis: Secondary | ICD-10-CM | POA: Diagnosis present

## 2021-02-18 DIAGNOSIS — F39 Unspecified mood [affective] disorder: Secondary | ICD-10-CM | POA: Diagnosis present

## 2021-02-18 HISTORY — DX: Encephalopathy, unspecified: G93.40

## 2021-02-18 LAB — URINALYSIS, ROUTINE W REFLEX MICROSCOPIC
Bilirubin Urine: NEGATIVE
Glucose, UA: NEGATIVE mg/dL
Ketones, ur: NEGATIVE mg/dL
Nitrite: POSITIVE — AB
Protein, ur: 30 mg/dL — AB
Specific Gravity, Urine: 1.013 (ref 1.005–1.030)
WBC, UA: >50 WBC/hpf — ABNORMAL HIGH (ref 0–5)
pH: 5 (ref 5.0–8.0)

## 2021-02-18 LAB — BLOOD CULTURE ID PANEL (REFLEXED) - BCID2

## 2021-02-18 LAB — CBC
HCT: 13.4 % — ABNORMAL LOW (ref 36.0–46.0)
HCT: 30.8 % — ABNORMAL LOW (ref 36.0–46.0)
Hemoglobin: 3.8 g/dL — CL (ref 12.0–15.0)
Hemoglobin: 9.2 g/dL — ABNORMAL LOW (ref 12.0–15.0)
MCH: 27.5 pg (ref 26.0–34.0)
MCH: 26.9 pg (ref 26.0–34.0)
MCHC: 28.4 g/dL — ABNORMAL LOW (ref 30.0–36.0)
MCHC: 29.9 g/dL — ABNORMAL LOW (ref 30.0–36.0)
MCV: 97.1 fL (ref 80.0–100.0)
MCV: 90.1 fL (ref 80.0–100.0)
Platelets: 89 10*3/uL — ABNORMAL LOW (ref 150–400)
Platelets: 225 10*3/uL (ref 150–400)
RBC: 1.38 MIL/uL — ABNORMAL LOW (ref 3.87–5.11)
RBC: 3.42 MIL/uL — ABNORMAL LOW (ref 3.87–5.11)
RDW: 16.7 % — ABNORMAL HIGH (ref 11.5–15.5)
RDW: 16.5 % — ABNORMAL HIGH (ref 11.5–15.5)
WBC: 4.3 10*3/uL (ref 4.0–10.5)
WBC: 9.3 10*3/uL (ref 4.0–10.5)
nRBC: 0 % (ref 0.0–0.2)
nRBC: 0 % (ref 0.0–0.2)

## 2021-02-18 LAB — COMPREHENSIVE METABOLIC PANEL
ALT: 9 U/L (ref 0–44)
AST: 25 U/L (ref 15–41)
Albumin: 3.4 g/dL — ABNORMAL LOW (ref 3.5–5.0)
Alkaline Phosphatase: 56 U/L (ref 38–126)
Anion gap: 7 (ref 5–15)
BUN: 13 mg/dL (ref 8–23)
CO2: 30 mmol/L (ref 22–32)
Calcium: 9.1 mg/dL (ref 8.9–10.3)
Chloride: 99 mmol/L (ref 98–111)
Creatinine, Ser: 1.49 mg/dL — ABNORMAL HIGH (ref 0.44–1.00)
GFR, Estimated: 37 mL/min — ABNORMAL LOW (ref >60)
Glucose, Bld: 107 mg/dL — ABNORMAL HIGH (ref 70–99)
Potassium: 4.5 mmol/L (ref 3.5–5.1)
Sodium: 136 mmol/L (ref 135–145)
Total Bilirubin: 0.5 mg/dL (ref 0.3–1.2)
Total Protein: 8.1 g/dL (ref 6.5–8.1)

## 2021-02-18 LAB — LACTIC ACID, PLASMA: Lactic Acid, Venous: 1.1 mmol/L (ref 0.5–1.9)

## 2021-02-18 LAB — RESP PANEL BY RT-PCR (FLU A&B, COVID) ARPGX2
Influenza A by PCR: NEGATIVE
Influenza B by PCR: NEGATIVE
SARS Coronavirus 2 by RT PCR: NEGATIVE

## 2021-02-18 LAB — TYPE AND SCREEN
ABO/RH(D): O POS
Antibody Screen: NEGATIVE

## 2021-02-18 LAB — APTT: aPTT: 37 seconds — ABNORMAL HIGH (ref 24–36)

## 2021-02-18 LAB — PROTIME-INR
INR: 1.4 — ABNORMAL HIGH (ref 0.8–1.2)
Prothrombin Time: 16.7 seconds — ABNORMAL HIGH (ref 11.4–15.2)

## 2021-02-18 MED ORDER — MELATONIN 3 MG PO TABS
3.0000 mg | ORAL_TABLET | Freq: Every day | ORAL | Status: DC
  Administered 2021-02-18 – 2021-02-21 (×4): 3 mg via ORAL
  Filled 2021-02-18 (×4): qty 1

## 2021-02-18 MED ORDER — ACETAMINOPHEN 325 MG PO TABS
650.0000 mg | ORAL_TABLET | Freq: Four times a day (QID) | ORAL | Status: DC | PRN
  Administered 2021-02-18: 650 mg via ORAL
  Filled 2021-02-18: qty 2

## 2021-02-18 MED ORDER — ALBUTEROL SULFATE HFA 108 (90 BASE) MCG/ACT IN AERS: 1.0000 | INHALATION_SPRAY | Freq: Four times a day (QID) | RESPIRATORY_TRACT | Status: DC | PRN

## 2021-02-18 MED ORDER — GABAPENTIN 400 MG PO CAPS
800.0000 mg | ORAL_CAPSULE | Freq: Three times a day (TID) | ORAL | Status: DC
  Administered 2021-02-18 – 2021-02-22 (×14): 800 mg via ORAL
  Filled 2021-02-18 (×14): qty 2

## 2021-02-18 MED ORDER — ASCORBIC ACID 500 MG PO TABS
500.0000 mg | ORAL_TABLET | Freq: Every morning | ORAL | Status: DC
  Administered 2021-02-18 – 2021-02-22 (×4): 500 mg via ORAL
  Filled 2021-02-18 (×4): qty 1

## 2021-02-18 MED ORDER — QUETIAPINE FUMARATE ER 50 MG PO TB24
150.0000 mg | ORAL_TABLET | Freq: Every day | ORAL | Status: DC
  Administered 2021-02-18 – 2021-02-21 (×4): 150 mg via ORAL
  Filled 2021-02-18 (×5): qty 3

## 2021-02-18 MED ORDER — TIOTROPIUM BROMIDE MONOHYDRATE 2.5 MCG/ACT IN AERS: 2.0000 | INHALATION_SPRAY | Freq: Every evening | RESPIRATORY_TRACT | Status: DC

## 2021-02-18 MED ORDER — SODIUM CHLORIDE 0.9 % IV SOLN
2.0000 g | INTRAVENOUS | Status: DC
  Administered 2021-02-18 – 2021-02-20 (×3): 2 g via INTRAVENOUS
  Filled 2021-02-18 (×3): qty 20

## 2021-02-18 MED ORDER — OXYCODONE HCL 5 MG PO TABS
5.0000 mg | ORAL_TABLET | Freq: Three times a day (TID) | ORAL | Status: DC
  Administered 2021-02-18 – 2021-02-22 (×14): 5 mg via ORAL
  Filled 2021-02-18 (×14): qty 1

## 2021-02-18 MED ORDER — VENLAFAXINE HCL ER 150 MG PO CP24
150.0000 mg | ORAL_CAPSULE | Freq: Every morning | ORAL | Status: DC
  Administered 2021-02-18 – 2021-02-22 (×5): 150 mg via ORAL
  Filled 2021-02-18 (×5): qty 1

## 2021-02-18 MED ORDER — POLYETHYLENE GLYCOL 3350 17 G PO PACK
17.0000 g | PACK | Freq: Two times a day (BID) | ORAL | Status: DC
  Administered 2021-02-18 – 2021-02-22 (×9): 17 g via ORAL
  Filled 2021-02-18 (×9): qty 1

## 2021-02-18 MED ORDER — UMECLIDINIUM BROMIDE 62.5 MCG/INH IN AEPB
1.0000 | INHALATION_SPRAY | RESPIRATORY_TRACT | Status: DC
  Filled 2021-02-18: qty 7

## 2021-02-18 MED ORDER — VANCOMYCIN HCL 1750 MG/350ML IV SOLN: 1750.0000 mg | INTRAVENOUS | Status: DC

## 2021-02-18 MED ORDER — BUSPIRONE HCL 5 MG PO TABS
2.5000 mg | ORAL_TABLET | Freq: Three times a day (TID) | ORAL | Status: DC
  Administered 2021-02-18 – 2021-02-22 (×14): 2.5 mg via ORAL
  Filled 2021-02-18 (×3): qty 1
  Filled 2021-02-18: qty 0.5
  Filled 2021-02-18 (×10): qty 1

## 2021-02-18 MED ORDER — GABAPENTIN 800 MG PO TABS
800.0000 mg | ORAL_TABLET | Freq: Three times a day (TID) | ORAL | Status: DC
  Filled 2021-02-18: qty 1

## 2021-02-18 MED ORDER — BICTEGRAVIR-EMTRICITAB-TENOFOV 50-200-25 MG PO TABS
1.0000 | ORAL_TABLET | Freq: Every day | ORAL | Status: DC
  Administered 2021-02-18 – 2021-02-22 (×5): 1 via ORAL
  Filled 2021-02-18 (×5): qty 1

## 2021-02-18 MED ORDER — SIMETHICONE 80 MG PO CHEW: 125.0000 mg | CHEWABLE_TABLET | Freq: Four times a day (QID) | ORAL | Status: DC | PRN

## 2021-02-18 MED ORDER — MONTELUKAST SODIUM 10 MG PO TABS
10.0000 mg | ORAL_TABLET | Freq: Every day | ORAL | Status: DC
  Administered 2021-02-18 – 2021-02-21 (×4): 10 mg via ORAL
  Filled 2021-02-18 (×4): qty 1

## 2021-02-18 MED ORDER — APIXABAN 5 MG PO TABS
5.0000 mg | ORAL_TABLET | Freq: Two times a day (BID) | ORAL | Status: DC
  Administered 2021-02-18 – 2021-02-22 (×9): 5 mg via ORAL
  Filled 2021-02-18 (×9): qty 1

## 2021-02-18 MED ORDER — ROPINIROLE HCL 1 MG PO TABS
1.0000 mg | ORAL_TABLET | Freq: Every day | ORAL | Status: DC
  Administered 2021-02-18 – 2021-02-21 (×4): 1 mg via ORAL
  Filled 2021-02-18 (×4): qty 1

## 2021-02-18 MED ORDER — TIZANIDINE HCL 2 MG PO TABS
4.0000 mg | ORAL_TABLET | Freq: Two times a day (BID) | ORAL | Status: DC | PRN
  Administered 2021-02-18: 4 mg via ORAL
  Filled 2021-02-18: qty 2

## 2021-02-18 MED ORDER — MOMETASONE FURO-FORMOTEROL FUM 200-5 MCG/ACT IN AERO
2.0000 | INHALATION_SPRAY | Freq: Two times a day (BID) | RESPIRATORY_TRACT | Status: DC
  Administered 2021-02-18 – 2021-02-22 (×5): 2 via RESPIRATORY_TRACT
  Filled 2021-02-18 (×2): qty 8.8

## 2021-02-18 MED ORDER — VANCOMYCIN HCL 2000 MG/400ML IV SOLN
2000.0000 mg | Freq: Once | INTRAVENOUS | Status: AC
  Administered 2021-02-18: 2000 mg via INTRAVENOUS
  Filled 2021-02-18: qty 400

## 2021-02-18 MED ORDER — SODIUM CHLORIDE 0.9 % IV SOLN: 1.0000 g | INTRAVENOUS | Status: DC

## 2021-02-18 MED ORDER — SENNOSIDES-DOCUSATE SODIUM 8.6-50 MG PO TABS
1.0000 | ORAL_TABLET | Freq: Every day | ORAL | Status: DC
  Administered 2021-02-18 – 2021-02-21 (×4): 1 via ORAL
  Filled 2021-02-18 (×4): qty 1

## 2021-02-18 MED ORDER — ALBUTEROL SULFATE (2.5 MG/3ML) 0.083% IN NEBU: 2.5000 mg | INHALATION_SOLUTION | RESPIRATORY_TRACT | Status: DC | PRN

## 2021-02-18 MED ORDER — PROSOURCE PLUS PO LIQD
30.0000 mL | Freq: Every day | ORAL | Status: DC
  Administered 2021-02-18 – 2021-02-22 (×5): 30 mL via ORAL
  Filled 2021-02-18 (×5): qty 30

## 2021-02-18 MED ORDER — LACTATED RINGERS IV SOLN: INTRAVENOUS | Status: AC

## 2021-02-18 MED ORDER — ACETAMINOPHEN 650 MG RE SUPP: 650.0000 mg | Freq: Four times a day (QID) | RECTAL | Status: DC | PRN

## 2021-02-18 NOTE — ED Notes (Signed)
Attempted to draw 2nd lactic from both IVs and 2 peripheral straight sticks, unsuccessful.

## 2021-02-18 NOTE — Progress Notes (Signed)
Pharmacy Antibiotic Note  [name] is a 75 y.o. female admitted on 02/17/2021 with fever and dysuria.  Pharmacy has been consulted for vancomycin dosing.  Patient febrile to 101.9, wbc normal at 9.3. Scr elevated at 1.49.   Vancomycin 2g x1 then 1750mg  mg IV Q 48 hrs. Goal AUC 400-550. Expected AUC: 521 SCr used: 1.49   Plan: Vancomycin 2g x1 then 1750mg  mg IV Q 48 hrs.    Temp (24hrs), Avg:99.2 F (37.3 C), Min:97.9 F (36.6 C), Max:101.9 F (38.8 C)  Recent Labs  Lab 02/17/21 2244 02/17/21 2300 02/18/21 0411 02/18/21 0502  WBC  --   --  4.3 9.3  CREATININE 1.49*  --   --   --   LATICACIDVEN  --  1.1  --   --     CrCl cannot be calculated (Unknown ideal weight.).    No Known Allergies   Thank you for allowing pharmacy to be a part of this patient's care.  Erin Hearing PharmD., BCPS Clinical Pharmacist 02/18/2021 8:58 PM

## 2021-02-18 NOTE — Progress Notes (Signed)
[facility] night shift telemetry coverage note.  The patient has 2 out of 4 bottles positive for staph epidermidis.  She is currently receiving ceftriaxone.  Vancomycin has been added pending antibiotic sensitivity.  Tennis Must, MD.

## 2021-02-18 NOTE — Plan of Care (Signed)
  Problem: Safety: Goal: Ability to remain free from injury will improve Outcome: Progressing   Problem: Pain Managment: Goal: General experience of comfort will improve Outcome: Progressing   Problem: Clinical Measurements: Goal: Ability to maintain clinical measurements within normal limits will improve Outcome: Progressing   Problem: Education: Goal: Knowledge of General Education information will improve Description: Including pain rating scale, medication(s)/side effects and non-pharmacologic comfort measures Outcome: Not Progressing

## 2021-02-18 NOTE — Progress Notes (Signed)
NEW ADMISSION NOTE New Admission Note:   Arrival Method: ED stretcher Mental Orientation: AAOx2 Telemetry: [phone] Assessment: Completed Skin: Dry, Flaky IV: RFA x2 Pain: 0/10 Tubes: n/a Safety Measures: Safety Fall Prevention Plan has been given, discussed and signed Admission: Completed [facility] [place] Orientation: Patient has been orientated to the room, unit and staff.  Family: none at bedside  Orders have been reviewed and implemented. Will continue to monitor the patient. Call light has been placed within reach and bed alarm has been activated.   Vira Agar, RN

## 2021-02-18 NOTE — H&P (Signed)
History and Physical    [name] DOB: Jan 17, 1946 DOA: 02/17/2021  PCP: Nolene Ebbs, MD  Patient coming from: [place].  Chief Complaint: Confusion.  HPI: [name] is a 75 y.o. 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  Was a contaminant since rest of them were negative at that time patient also had a MRI brain which showed possibility of recurrence of mets and has been referred to patient's neuro oncologist was found to be confused at the skilled nursing facility and was brought to the ER.  Patient states at the time of my exam that she did have some left flank pain 3 days ago.  About 3 weeks ago patient was brought in for nausea vomiting.  ED Course: In the ER patient was febrile with temperature 101.9 F blood pressure systolic was 99 UA is consistent with UTI.  Patient initially required nonrebreather of oxygen but soon was weaned off to 4 L oxygen.  Chest x-ray unremarkable.  Patient had blood cultures drawn fluid bolus given started on antibiotics for UTI.  Labs show mildly increased creatinine from baseline of 1.4.  Lactic acid was normal.  COVID test negative.  Review of Systems: As per HPI, rest all negative.   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   Neuromuscular disorder (HCC)    fingers/feet  neuropathy   Non-small cell lung cancer ([place])    Renal disorder    S/P radiation therapy 01/26/14-02/02/14   sbrt  lt upper lung-54Gy/72fx   Stroke [facility])     Past Surgical History:  Procedure Laterality Date   ABCESS DRAINAGE     abcess under Left arm, abcess from L breast   APPLICATION OF CRANIAL NAVIGATION Right 11/28/2019   Procedure: APPLICATION OF CRANIAL NAVIGATION;  Surgeon: [name], MD;  Location: [place];  Service: Neurosurgery;  Laterality: Right;   BRAIN SURGERY     BUBBLE STUDY  03/08/2019   Procedure: BUBBLE STUDY;  Surgeon: [name], MD;  Location: [place];  Service: Cardiovascular;;   CRANIOTOMY Right 11/28/2019   Procedure: Right Craniotomy for Tumor Resection;  Surgeon: [name], MD;  Location: [place];  Service: Neurosurgery;  Laterality: Right;   CRANIOTOMY     removal of abnormal cells on uterus     TEE WITHOUT CARDIOVERSION N/A 03/08/2019   Procedure: TRANSESOPHAGEAL ECHOCARDIOGRAM (TEE);  Surgeon: [name], MD;  Location: [facility] ENDOSCOPY;  Service: Cardiovascular;  Laterality: N/A;   thyroid gland removed       reports that she has never smoked. She has never used smokeless tobacco. She reports that she does not currently use alcohol. She reports that she does not currently use drugs.  No Known Allergies  Family History  Problem Relation Age of Onset  Pneumonia Mother    Hypertension Sister    Diabetes Sister    Cancer Sister        pancreatic and lung   Cancer Brother        pancreatic   Cancer Brother        pancreatic    Prior to Admission medications   Medication Sig Start Date End Date Taking? Authorizing Provider  albuterol (PROVENTIL) (2.5 MG/3ML) 0.083% nebulizer solution Take 2.5 mg by nebulization every 4 (four) hours as needed for wheezing.   Yes [provider]  albuterol (VENTOLIN HFA) 108 (90 Base) MCG/ACT inhaler Inhale 1 puff into the lungs every 6 (six) hours as needed for shortness of breath.   12/23/19  Yes [provider]  Amino Acids-Protein Hydrolys (FEEDING SUPPLEMENT, PRO-STAT SUGAR FREE 64,) LIQD Take 30 mLs by mouth daily.   Yes [provider]  ammonium lactate (LAC-HYDRIN) 12 % lotion Apply 1 application topically in the morning and at bedtime.   Yes [provider]  apixaban (ELIQUIS) 5 MG TABS tablet Take 1 tablet (5 mg total) by mouth 2 (two) times daily. 03/13/19  Yes Hosie Poisson, MD  bictegravir-emtricitabine-tenofovir AF (BIKTARVY) 50-200-25 MG TABS tablet Take 1 tablet by mouth daily. 07/12/19  Yes Tommy Medal, Lavell Islam, MD  busPIRone (BUSPAR) 5 MG tablet Take 2.5 mg by mouth 3 (three) times daily.   Yes [provider]  diclofenac Sodium (VOLTAREN) 1 % GEL Apply 2 g topically every 4 (four) hours as needed for pain. 11/09/19  Yes [provider]  Fluticasone-Salmeterol (ADVAIR) 500-50 MCG/DOSE AEPB Inhale 1 puff into the lungs every 12 (twelve) hours. 09/04/19  Yes [provider]  gabapentin (NEURONTIN) 800 MG tablet Take 800 mg by mouth 3 (three) times daily. 10/26/19  Yes [provider]  ibuprofen (ADVIL) 800 MG tablet Take 800 mg by mouth every 6 (six) hours as needed (pain).   Yes [provider]  Melatonin 3 MG CAPS Take 3 mg by mouth at bedtime.   Yes [provider]  montelukast (SINGULAIR) 10 MG tablet Take 10 mg by mouth at bedtime.  09/04/19  Yes [provider]  oxycodone (OXY-IR) 5 MG capsule Take 5 mg by mouth 3 (three) times daily.   Yes [provider]  OXYGEN Inhale 3 L into the lungs continuous.   Yes [provider]  polyethylene glycol (MIRALAX / GLYCOLAX) 17 g packet Take 17 g by mouth 2 (two) times daily. Mix in 4-8 oz liquid and drink   Yes [provider]  QUEtiapine Fumarate (SEROQUEL XR) 150 MG 24 hr tablet Take 150 mg by mouth at bedtime. 11/09/19  Yes [provider]  rOPINIRole (REQUIP) 1 MG tablet Take 1 tablet (1 mg total) by  mouth at bedtime. 05/20/19  Yes Ward, Delice Bison, DO  sennosides-docusate sodium (SENOKOT-S) 8.6-50 MG tablet Take 1 tablet by mouth at bedtime.   Yes [provider]  simethicone (MYLICON) 500 MG chewable tablet Chew 125 mg by mouth every 6 (six) hours as needed (bloating/gas).   Yes [provider]  SPIRIVA RESPIMAT 2.5 MCG/ACT AERS INHALE 2 PUFFS INTO THE LUNGS DAILY Patient taking differently: Inhale 2 puffs into the lungs every evening. 09/24/14  Yes Rigoberto Noel, MD  tiZANidine (ZANAFLEX) 4 MG tablet Take 4 mg by mouth every 12 (twelve) hours as needed (pain). 11/08/19  Yes [provider]  traMADol (ULTRAM) 50 MG tablet Take 1 tablet (50 mg total) by  mouth in the morning, at noon, and at bedtime. Patient taking differently: Take 50 mg by mouth every 8 (eight) hours as needed (shoulder pain). 01/09/21  Yes Darliss Cheney, MD  venlafaxine XR (EFFEXOR-XR) 150 MG 24 hr capsule Take 150 mg by mouth every morning.   Yes [provider]  vitamin C (ASCORBIC ACID) 500 MG tablet Take 500 mg by mouth every morning.   Yes [provider]  B Complex-Biotin-FA (VITAMIN B50 COMPLEX PO) Take 1 tablet by mouth every morning. Patient not taking: Reported on 02/18/2021    [provider]  bisacodyl (DULCOLAX) 5 MG EC tablet Take 5 mg by mouth once. Patient not taking: No sig reported    [provider]  ondansetron (ZOFRAN ODT) 4 MG disintegrating tablet Take 1 tablet (4 mg total) by mouth every 8 (eight) hours as needed for nausea or vomiting. Patient not taking: Reported on 02/18/2021 01/26/21   Eustaquio Maize, PA-C  oxyCODONE (OXY IR/ROXICODONE) 5 MG immediate release tablet Take 1 tablet (5 mg total) by mouth every 8 (eight) hours as needed for moderate pain. Patient not taking: Reported on 02/18/2021 01/09/21   Darliss Cheney, MD    Physical Exam: Constitutional: Moderately built and nourished. Vitals:   02/18/21 0115 02/18/21 0130 02/18/21 0331  02/18/21 0415  BP: 108/77 118/61    Pulse: 98 81  80  Resp: (!) 27 15  20   Temp:   100.1 F (37.8 C)   TempSrc:   Oral   SpO2: 97% 100%  97%   Eyes: Anicteric no pallor. ENMT: No discharge from the ears eyes nose and mouth. Neck: No mass felt.  No neck rigidity. Respiratory: No rhonchi or crepitations. Cardiovascular: S1-S2 heard. Abdomen: Soft nontender bowel sound present. Musculoskeletal: No edema. Skin: No rash. Neurologic: Alert awake oriented time place and person.  Left-sided hemiplegia. Psychiatric: Appears normal.  Normal affect.   Labs on Admission: I have personally reviewed following labs and imaging studies  CBC: No results for input(s): WBC, NEUTROABS, HGB, HCT, MCV, PLT in the last 168 hours. Basic Metabolic Panel: Recent Labs  Lab 02/17/21 2244  NA 136  K 4.5  CL 99  CO2 30  GLUCOSE 107*  BUN 13  CREATININE 1.49*  CALCIUM 9.1   GFR: CrCl cannot be calculated (Unknown ideal weight.). Liver Function Tests: Recent Labs  Lab 02/17/21 2244  AST 25  ALT 9  ALKPHOS 56  BILITOT 0.5  PROT 8.1  ALBUMIN 3.4*   No results for input(s): LIPASE, AMYLASE in the last 168 hours. No results for input(s): AMMONIA in the last 168 hours. Coagulation Profile: Recent Labs  Lab 02/17/21 2244  INR 1.4*   Cardiac Enzymes: No results for input(s): CKTOTAL, CKMB, CKMBINDEX, TROPONINI in the last 168 hours. BNP (last 3 results) No results for input(s): PROBNP in the last 8760 hours. HbA1C: No results for input(s): HGBA1C in the last 72 hours. CBG: No results for input(s): GLUCAP in the last 168 hours. Lipid Profile: No results for input(s): CHOL, HDL, LDLCALC, TRIG, CHOLHDL, LDLDIRECT in the last 72 hours. Thyroid Function Tests: No results for input(s): TSH, T4TOTAL, FREET4, T3FREE, THYROIDAB in the last 72 hours. Anemia Panel: No results for input(s): VITAMINB12, FOLATE, FERRITIN, TIBC, IRON, RETICCTPCT in the last 72 hours. Urine analysis:    Component  Value Date/Time   COLORURINE YELLOW 02/17/2021 0108   APPEARANCEUR CLOUDY (A) 02/17/2021 0108   LABSPEC 1.013 02/17/2021 0108   PHURINE 5.0 02/17/2021 0108   GLUCOSEU  NEGATIVE 02/17/2021 0108   HGBUR MODERATE (A) 02/17/2021 0108   BILIRUBINUR NEGATIVE 02/17/2021 0108   KETONESUR NEGATIVE 02/17/2021 0108   PROTEINUR 30 (A) 02/17/2021 0108   UROBILINOGEN 0.2 03/28/2015 1227   NITRITE POSITIVE (A) 02/17/2021 0108   LEUKOCYTESUR LARGE (A) 02/17/2021 0108   Sepsis Labs: @LABRCNTIP (procalcitonin:4,lacticidven:4) ) Recent Results (from the past 240 hour(s))  Blood Culture (routine x 2)     Status: None (Preliminary result)   Collection Time: 02/17/21 11:05 PM   Specimen: BLOOD RIGHT FOREARM  Result Value Ref Range Status   Specimen Description BLOOD RIGHT FOREARM  Final   Special Requests   Final    BOTTLES DRAWN AEROBIC AND ANAEROBIC Blood Culture adequate volume Performed at [facility], [place] [address] [place], [place] [address]    Culture PENDING  Incomplete   Report Status PENDING  Incomplete  Resp Panel by RT-PCR (Flu A&B, Covid) Nasopharyngeal Swab     Status: None   Collection Time: 02/17/21 11:06 PM   Specimen: Nasopharyngeal Swab; Nasopharyngeal(NP) swabs in vial transport medium  Result Value Ref Range Status   SARS Coronavirus 2 by RT PCR NEGATIVE NEGATIVE Final    Comment: (NOTE) SARS-CoV-2 target nucleic acids are NOT DETECTED.  The SARS-CoV-2 RNA is generally detectable in upper respiratory specimens during the acute phase of infection. The lowest concentration of SARS-CoV-2 viral copies this assay can detect is 138 copies/mL. A negative result does not preclude SARS-Cov-2 infection and should not be used as the sole basis for treatment or other patient management decisions. A negative result may occur with  improper specimen collection/handling, submission of specimen other than nasopharyngeal swab, presence of viral mutation(s) within the areas targeted  by this assay, and inadequate number of viral copies(<138 copies/mL). A negative result must be combined with clinical observations, patient history, and epidemiological information. The expected result is Negative.  Fact Sheet for Patients:  EntrepreneurPulse.com.au  Fact Sheet for Healthcare Providers:  IncredibleEmployment.be  This test is no t yet approved or cleared by the [place] FDA and  has been authorized for detection and/or diagnosis of SARS-CoV-2 by FDA under an Emergency Use Authorization (EUA). This EUA will remain  in effect (meaning this test can be used) for the duration of the COVID-19 declaration under Section 564(b)(1) of the Act, 21 U.S.C.section 360bbb-3(b)(1), unless the authorization is terminated  or revoked sooner.       Influenza A by PCR NEGATIVE NEGATIVE Final   Influenza B by PCR NEGATIVE NEGATIVE Final    Comment: (NOTE) The Xpert Xpress SARS-CoV-2/FLU/RSV plus assay is intended as an aid in the diagnosis of influenza from Nasopharyngeal swab specimens and should not be used as a sole basis for treatment. Nasal washings and aspirates are unacceptable for Xpert Xpress SARS-CoV-2/FLU/RSV testing.  Fact Sheet for Patients: EntrepreneurPulse.com.au  Fact Sheet for Healthcare Providers: IncredibleEmployment.be  This test is not yet approved or cleared by the [place] FDA and has been authorized for detection and/or diagnosis of SARS-CoV-2 by FDA under an Emergency Use Authorization (EUA). This EUA will remain in effect (meaning this test can be used) for the duration of the COVID-19 declaration under Section 564(b)(1) of the Act, 21 U.S.C. section 360bbb-3(b)(1), unless the authorization is terminated or revoked.  Performed at [facility], [place] [address], [place], [place] [address]      Radiological Exams on Admission: CT HEAD WO CONTRAST (5MM)  Result  Date: 02/18/2021 CLINICAL DATA:  Altered mental status. EXAM:  CT HEAD WITHOUT CONTRAST TECHNIQUE: Contiguous axial images were obtained from the base of the skull through the vertex without intravenous contrast. COMPARISON:  Head CT dated 01/03/2021. 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 Slight interval decrease in the white matter edema in the right frontoparietal and posterior temporal region compared to prior CT. Evaluation for mass is very limited in the absence of contrast. There is no acute intracranial hemorrhage. No mass effect or midline shift. No extra-axial fluid collection. Vascular: No hyperdense vessel or unexpected calcification. Skull: No acute calvarial pathology.  Right frontal craniotomy. Sinuses/Orbits: The visualized paranasal sinuses and mastoid air cells are clear. Other: None IMPRESSION: 1. No acute intracranial hemorrhage. 2. 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 Slight interval decrease in the white matter hypodensity in the right frontoparietal and posterior temporal region compared to prior CT. Electronically Signed   By: Anner Crete M.D.   On: 02/18/2021 02:58   DG Chest Port 1 View  Result Date: 02/17/2021 CLINICAL DATA:  Questionable sepsis. Evaluate for abnormality. Altered mental status. EXAM: PORTABLE CHEST 1 VIEW COMPARISON:  01/26/2021 FINDINGS: Shallow inspiration. Heart size and pulmonary vascularity are normal. No airspace disease or consolidation in the lungs. Old left rib fractures with dystrophic calcification. No pleural effusions. No pneumothorax. Calcified and tortuous aorta. IMPRESSION: No evidence of active pulmonary disease.  Old left rib fractures. Electronically Signed   By: Lucienne Capers M.D.   On: 02/17/2021 23:53   CT RENAL STONE STUDY  Result Date: 02/18/2021 CLINICAL DATA:  Flank pain EXAM: CT ABDOMEN AND PELVIS WITHOUT CONTRAST TECHNIQUE: Multidetector CT imaging of  the abdomen and pelvis was performed following the standard protocol without IV contrast. COMPARISON:  01/26/2021 FINDINGS: Lower chest: No acute abnormality. Hepatobiliary: No focal liver abnormality is seen. No gallstones, gallbladder wall thickening, or biliary dilatation. Pancreas: Unremarkable. 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 Spleen: Unremarkable Adrenals/Urinary Tract: Adrenal glands are unremarkable. Kidneys are normal, without renal calculi, focal lesion, or hydronephrosis. Bladder is unremarkable. Stomach/Bowel: There is moderate stool within the rectal vault with rectal wall thickening and mild perirectal edema possibly reflecting changes of stercoral proctitis. Moderate stool is seen throughout the remainder of the colon. The stomach, small bowel, and large bowel are otherwise unremarkable. Appendix normal. No free intraperitoneal gas or fluid. Vascular/Lymphatic: Moderate aortoiliac atherosclerotic calcification. No aortic aneurysm. No pathologic adenopathy within the abdomen and pelvis. Reproductive: Uterus and bilateral adnexa are unremarkable. Other: Tiny broad-based fat containing umbilical hernia. Musculoskeletal: No acute bone abnormality. No lytic or blastic bone lesion. Degenerative changes are seen within the lumbar spine. IMPRESSION: Moderate stool within the rectal vault with associated rectal wall thickening and perirectal edema suggesting changes of stercoral proctitis. Moderate stool throughout the colon without evidence of obstruction. Normal noncontrast examination of the kidneys and renal collecting system. Aortic Atherosclerosis (ICD10-I70.0). Electronically Signed   By: Fidela Salisbury M.D.   On: 02/18/2021 04:42    EKG: Independently reviewed.  Normal sinus rhythm with borderline T wave abnormalities.  Assessment/Plan Principal Problem:   Acute encephalopathy Active Problems:   HIV (human immunodeficiency virus infection) ([place])    COPD exacerbation (HCC)   Lower urinary tract infectious disease    Acute encephalopathy likely from urinary tract infection for which patient has been started on empiric antibiotics follow cultures continue hydration since patient's initial presentation patient had low normal blood pressure.  At the time of my exam patient is largely  improved.  Still febrile though. History of pulmonary embolism on Eliquis. History of COPD on 3 L oxygen at home presently on 4 to 5 L.  We will closely monitor.  Patient is not in any distress. History of lung cancer with metastatic brain with recent MRI showing possibility of recurrence.  CT head done today shows improvement.  Will need to follow-up with neuro oncologist Dr. Mickeal Skinner. History of stroke with left-sided hemiparesis and on Eliquis. HIV on Biktarvy.  Last CD4 count in June 2021 was 714.  CBC is pending.   DVT prophylaxis: Eliquis. Code Status: DNR. Family Communication: Discussed with patient. Disposition Plan: Back to facility when stable. Consults called: None. Admission status: Observation.   Rise Patience MD [facility] Pager [phone].  If 7PM-7AM, please contact night-coverage [email or web address] Password [facility]  02/18/2021, 4:45 AM

## 2021-02-18 NOTE — Progress Notes (Signed)
PHARMACY - PHYSICIAN COMMUNICATION CRITICAL VALUE ALERT - BLOOD CULTURE IDENTIFICATION (BCID)  [name] is an 75 y.o. female who presented to [facility] on 02/17/2021 with a chief complaint of fever.  Assessment:  Patient febrile with metastatic lung cancer, from SNF noted to have dysuria. Patient has received ceftriaxone for possible urosepsis. Now with 2/4 bottles growing staph epi with methicillin resistance. Discussed with on call coverage and will add vancomycin.   Name of physician (or Provider) Contacted: D.Olevia Bowens MD  Current antibiotics: Ceftriaxone   Changes to prescribed antibiotics recommended:  Recommendations accepted by provider, will add vancomycin.   Results for orders placed or performed during the hospital encounter of 02/17/21  Blood Culture ID Panel (Reflexed) (Collected: 02/17/2021 11:00 PM)  Result Value Ref Range   Enterococcus faecalis NOT DETECTED NOT DETECTED   Enterococcus Faecium NOT DETECTED NOT DETECTED   Listeria monocytogenes NOT DETECTED NOT DETECTED   Staphylococcus species DETECTED (A) NOT DETECTED   Staphylococcus aureus (BCID) NOT DETECTED NOT DETECTED   Staphylococcus epidermidis DETECTED (A) NOT DETECTED   Staphylococcus lugdunensis NOT DETECTED NOT DETECTED   Streptococcus species NOT DETECTED NOT DETECTED   Streptococcus agalactiae NOT DETECTED NOT DETECTED   Streptococcus pneumoniae NOT DETECTED NOT DETECTED   Streptococcus pyogenes NOT DETECTED NOT DETECTED   A.calcoaceticus-baumannii NOT DETECTED NOT DETECTED   Bacteroides fragilis NOT DETECTED NOT DETECTED   Enterobacterales NOT DETECTED NOT DETECTED   Enterobacter cloacae complex NOT DETECTED NOT DETECTED   Escherichia coli NOT DETECTED NOT DETECTED   Klebsiella aerogenes NOT DETECTED NOT DETECTED   Klebsiella oxytoca NOT DETECTED NOT DETECTED   Klebsiella pneumoniae NOT DETECTED NOT DETECTED   Proteus species NOT DETECTED NOT DETECTED   Salmonella species NOT DETECTED NOT  DETECTED   Serratia marcescens NOT DETECTED NOT DETECTED   Haemophilus influenzae NOT DETECTED NOT DETECTED   Neisseria meningitidis NOT DETECTED NOT DETECTED   Pseudomonas aeruginosa NOT DETECTED NOT DETECTED   Stenotrophomonas maltophilia NOT DETECTED NOT DETECTED   Candida albicans NOT DETECTED NOT DETECTED   Candida auris NOT DETECTED NOT DETECTED   Candida glabrata NOT DETECTED NOT DETECTED   Candida krusei NOT DETECTED NOT DETECTED   Candida parapsilosis NOT DETECTED NOT DETECTED   Candida tropicalis NOT DETECTED NOT DETECTED   Cryptococcus neoformans/gattii NOT DETECTED NOT DETECTED   Methicillin resistance mecA/C DETECTED (A) NOT DETECTED    Erin Hearing PharmD., BCPS Clinical Pharmacist 02/18/2021 7:26 PM

## 2021-02-18 NOTE — Progress Notes (Signed)
PROGRESS NOTE    [name]  ACZ:[id number] DOB: 15-Nov-1945 DOA: 02/17/2021 PCP: Nolene Ebbs, MD      Brief Narrative:  [name] is a 75 y.o. 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  In the ER, endorsed dysuria.  CT renal showed no hydronephrosis or stone.  Maybe some stercoral colitis.  UA with new nitrites, leuks. Cr 1.5 from baseline 0.9.   COVID-.          Assessment & Plan:  Acute metabolic encephalopathy due to sepsis and UTI likely At baseline pateitn has poor functional mobility but is alert, oriented and interactive per Onc notes and prior discharge summaries.  Here she was initially somnolent and poorly responsive.  This morning, she is more responsive, but oriented to self only, thinks she is in [facility], not sure where she is.      Sepsis due to likely UTI Admitted with fever, HR>90, confusion and AKI.  Reports hemoptysis. - Continue Rocephin - Follow urine and blood - Obtain sputum culture - Check HIV control  AKI Baseline Cr 0.9, currently up to 1.5.   - IV fluids - Avoid nephrotoxins and hypotension  NSCLC of the LLL metastatic to brain CT head w/o contrast no bleeding, no clear change.  Asthma COPD No change -Continue ICS/LABA/LAMA, Singulair  HIV 1 year ago, CD4 ct low and VL high. - Check RNA quant and CD4 - Continue Biktarvy  Cerebrovascular disease Hemiparesis at baseline   History of PE -Continue Eliquis  Mood disorder -Continue Buspar, Guetiapine, venlafaxine  Neuropathy -Continue Gabapentin  RLS -Continue ropinirole   Spurious thrombocytopenia Repeat normal         Disposition: Status is: INPATIENT  The patient will require care spanning > 2 midnights and should be moved to inpatient because:  ongoing confusion, and sepsis in setting of age 50, with  paralysis, HIV  Dispo: The patient is from: SNF              Anticipated d/c is to: SNF              Patient currently is not medically stable to d/c.   Difficult to place patient No              MDM: This is a no charge note.  For further details, please see H&P by my partner Dr. Hal Hope from earlier today.  The below labs and imaging reports were reviewed and summarized above.    DVT prophylaxis:  apixaban (ELIQUIS) tablet 5 mg  Code Status: DNR Family Communication:              Subjective: No headache.  She has dysuria, hemopytsis.          Objective: Vitals:   02/18/21 1041 02/18/21 1832 02/18/21 2015 02/18/21 2107  BP: (!) 101/52 (!) 125/103  (!) 105/43  Pulse: 73 73  74  Resp: 18 19  16   Temp: 97.9 F (36.6 C) 98 F (36.7 C)  98.7 F (37.1 C)  TempSrc: Oral Oral  Oral  SpO2: 97% 92%  96%  Weight:   99.8 kg 99.8 kg  Height:   5\' 9"  (1.753 m)     Intake/Output Summary (Last 24 hours) at 02/18/2021 2205 Last data filed at 02/18/2021 2107 Gross per 24 hour  Intake 240 ml  Output 1200  ml  Net -960 ml   Filed Weights   02/18/21 2015 02/18/21 2107  Weight: 99.8 kg 99.8 kg    Examination: The patient was seen and examined.      Data Reviewed: I have personally reviewed following labs and imaging studies:  CBC: Recent Labs  Lab 02/18/21 0411 02/18/21 0502  WBC 4.3 9.3  HGB 3.8* 9.2*  HCT 13.4* 30.8*  MCV 97.1 90.1  PLT 89* 694   Basic Metabolic Panel: Recent Labs  Lab 02/17/21 2244  NA 136  K 4.5  CL 99  CO2 30  GLUCOSE 107*  BUN 13  CREATININE 1.49*  CALCIUM 9.1   GFR: Estimated Creatinine Clearance: 42.3 mL/min (A) (by C-G formula based on SCr of 1.49 mg/dL (H)). Liver Function Tests: Recent Labs  Lab 02/17/21 2244  AST 25  ALT 9  ALKPHOS 56  BILITOT 0.5  PROT 8.1  ALBUMIN 3.4*   No results for input(s): LIPASE, AMYLASE in the last 168 hours. No results for input(s): AMMONIA in the last 168  hours. Coagulation Profile: Recent Labs  Lab 02/17/21 2244  INR 1.4*   Cardiac Enzymes: No results for input(s): CKTOTAL, CKMB, CKMBINDEX, TROPONINI in the last 168 hours. BNP (last 3 results) No results for input(s): PROBNP in the last 8760 hours. HbA1C: No results for input(s): HGBA1C in the last 72 hours. CBG: No results for input(s): GLUCAP in the last 168 hours. Lipid Profile: No results for input(s): CHOL, HDL, LDLCALC, TRIG, CHOLHDL, LDLDIRECT in the last 72 hours. Thyroid Function Tests: No results for input(s): TSH, T4TOTAL, FREET4, T3FREE, THYROIDAB in the last 72 hours. Anemia Panel: No results for input(s): VITAMINB12, FOLATE, FERRITIN, TIBC, IRON, RETICCTPCT in the last 72 hours. Urine analysis:    Component Value Date/Time   COLORURINE YELLOW 02/17/2021 0108   APPEARANCEUR CLOUDY (A) 02/17/2021 0108   LABSPEC 1.013 02/17/2021 0108   PHURINE 5.0 02/17/2021 0108   GLUCOSEU NEGATIVE 02/17/2021 0108   HGBUR MODERATE (A) 02/17/2021 0108   BILIRUBINUR NEGATIVE 02/17/2021 0108   KETONESUR NEGATIVE 02/17/2021 0108   PROTEINUR 30 (A) 02/17/2021 0108   UROBILINOGEN 0.2 03/28/2015 1227   NITRITE POSITIVE (A) 02/17/2021 0108   LEUKOCYTESUR LARGE (A) 02/17/2021 0108   Sepsis Labs: @LABRCNTIP (procalcitonin:4,lacticacidven:4)  ) Recent Results (from the past 240 hour(s))  Blood Culture (routine x 2)     Status: None (Preliminary result)   Collection Time: 02/17/21 11:00 PM   Specimen: BLOOD RIGHT HAND  Result Value Ref Range Status   Specimen Description BLOOD RIGHT HAND  Final   Special Requests   Final    BOTTLES DRAWN AEROBIC AND ANAEROBIC Blood Culture results may not be optimal due to an inadequate volume of blood received in culture bottles   Culture  Setup Time   Final    GRAM POSITIVE COCCI IN CLUSTERS IN BOTH AEROBIC AND ANAEROBIC BOTTLES Organism ID to follow CRITICAL RESULT CALLED TO, READ BACK BY AND VERIFIED WITHTillman Sers [facility] [id number] 02/18/21 A  BROWNING Performed at [facility], [place] [address] [place], [place] [address]    Culture PENDING  Incomplete   Report Status PENDING  Incomplete  Blood Culture ID Panel (Reflexed)     Status: Abnormal   Collection Time: 02/17/21 11:00 PM  Result Value Ref Range Status   Enterococcus faecalis NOT DETECTED NOT DETECTED Final   Enterococcus Faecium NOT DETECTED NOT DETECTED Final   Listeria monocytogenes NOT DETECTED NOT DETECTED Final   Staphylococcus species DETECTED (  A) NOT DETECTED Final    Comment: CRITICAL RESULT CALLED TO, READ BACK BY AND VERIFIED WITHTillman Sers [facility] [id number] 02/18/21 A BROWNING    Staphylococcus aureus (BCID) NOT DETECTED NOT DETECTED Final   Staphylococcus epidermidis DETECTED (A) NOT DETECTED Final    Comment: Methicillin (oxacillin) resistant coagulase negative staphylococcus. Possible blood culture contaminant (unless isolated from more than one blood culture draw or clinical case suggests pathogenicity). No antibiotic treatment is indicated for blood  culture contaminants. CRITICAL RESULT CALLED TO, READ BACK BY AND VERIFIED WITH: Tillman Sers PHARMD [id number] 02/18/21 A BROWNING    Staphylococcus lugdunensis NOT DETECTED NOT DETECTED Final   Streptococcus species NOT DETECTED NOT DETECTED Final   Streptococcus agalactiae NOT DETECTED NOT DETECTED Final   Streptococcus pneumoniae NOT DETECTED NOT DETECTED Final   Streptococcus pyogenes NOT DETECTED NOT DETECTED Final   A.calcoaceticus-baumannii NOT DETECTED NOT DETECTED Final   Bacteroides fragilis NOT DETECTED NOT DETECTED Final   Enterobacterales NOT DETECTED NOT DETECTED Final   Enterobacter cloacae complex NOT DETECTED NOT DETECTED Final   Escherichia coli NOT DETECTED NOT DETECTED Final   Klebsiella aerogenes NOT DETECTED NOT DETECTED Final   Klebsiella oxytoca NOT DETECTED NOT DETECTED Final   Klebsiella pneumoniae NOT DETECTED NOT DETECTED Final   Proteus species NOT DETECTED NOT DETECTED Final    Salmonella species NOT DETECTED NOT DETECTED Final   Serratia marcescens NOT DETECTED NOT DETECTED Final   Haemophilus influenzae NOT DETECTED NOT DETECTED Final   Neisseria meningitidis NOT DETECTED NOT DETECTED Final   Pseudomonas aeruginosa NOT DETECTED NOT DETECTED Final   Stenotrophomonas maltophilia NOT DETECTED NOT DETECTED Final   Candida albicans NOT DETECTED NOT DETECTED Final   Candida auris NOT DETECTED NOT DETECTED Final   Candida glabrata NOT DETECTED NOT DETECTED Final   Candida krusei NOT DETECTED NOT DETECTED Final   Candida parapsilosis NOT DETECTED NOT DETECTED Final   Candida tropicalis NOT DETECTED NOT DETECTED Final   Cryptococcus neoformans/gattii NOT DETECTED NOT DETECTED Final   Methicillin resistance mecA/C DETECTED (A) NOT DETECTED Final    Comment: CRITICAL RESULT CALLED TO, READ BACK BY AND VERIFIED WITHTillman Sers [facility] 1884 02/18/21 A BROWNING Performed at [facility], [address], [place], [place] [address]   Blood Culture (routine x 2)     Status: None (Preliminary result)   Collection Time: 02/17/21 11:05 PM   Specimen: BLOOD RIGHT FOREARM  Result Value Ref Range Status   Specimen Description BLOOD RIGHT FOREARM  Final   Special Requests   Final    BOTTLES DRAWN AEROBIC AND ANAEROBIC Blood Culture adequate volume Performed at [facility], [place] [address] [place],  [address]    Culture PENDING  Incomplete   Report Status PENDING  Incomplete  Resp Panel by RT-PCR (Flu A&B, Covid) Nasopharyngeal Swab     Status: None   Collection Time: 02/17/21 11:06 PM   Specimen: Nasopharyngeal Swab; Nasopharyngeal(NP) swabs in vial transport medium  Result Value Ref Range Status   SARS Coronavirus 2 by RT PCR NEGATIVE NEGATIVE Final    Comment: (NOTE) SARS-CoV-2 target nucleic acids are NOT DETECTED.  The SARS-CoV-2 RNA is generally detectable in upper respiratory specimens during the acute phase of infection. The lowest concentration of  SARS-CoV-2 viral copies this assay can detect is 138 copies/mL. A negative result does not preclude SARS-Cov-2 infection and should not be used as the sole basis for treatment or other patient management decisions. A negative result may  occur with  improper specimen collection/handling, submission of specimen other than nasopharyngeal swab, presence of viral mutation(s) within the areas targeted by this assay, and inadequate number of viral copies(<138 copies/mL). A negative result must be combined with clinical observations, patient history, and epidemiological information. The expected result is Negative.  Fact Sheet for Patients:  EntrepreneurPulse.com.au  Fact Sheet for Healthcare Providers:  IncredibleEmployment.be  This test is no t yet approved or cleared by the [place] FDA and  has been authorized for detection and/or diagnosis of SARS-CoV-2 by FDA under an Emergency Use Authorization (EUA). This EUA will remain  in effect (meaning this test can be used) for the duration of the COVID-19 declaration under Section 564(b)(1) of the Act, 21 U.S.C.section 360bbb-3(b)(1), unless the authorization is terminated  or revoked sooner.       Influenza A by PCR NEGATIVE NEGATIVE Final   Influenza B by PCR NEGATIVE NEGATIVE Final    Comment: (NOTE) The Xpert Xpress SARS-CoV-2/FLU/RSV plus assay is intended as an aid in the diagnosis of influenza from Nasopharyngeal swab specimens and should not be used as a sole basis for treatment. Nasal washings and aspirates are unacceptable for Xpert Xpress SARS-CoV-2/FLU/RSV testing.  Fact Sheet for Patients: EntrepreneurPulse.com.au  Fact Sheet for Healthcare Providers: IncredibleEmployment.be  This test is not yet approved or cleared by the [place] FDA and has been authorized for detection and/or diagnosis of SARS-CoV-2 by FDA under an Emergency Use  Authorization (EUA). This EUA will remain in effect (meaning this test can be used) for the duration of the COVID-19 declaration under Section 564(b)(1) of the Act, 21 U.S.C. section 360bbb-3(b)(1), unless the authorization is terminated or revoked.  Performed at [facility], [place] [address], [place], [place] [address]          Radiology Studies: CT HEAD WO CONTRAST (5MM)  Result Date: 02/18/2021 CLINICAL DATA:  Altered mental status. EXAM: CT HEAD WITHOUT CONTRAST TECHNIQUE: Contiguous axial images were obtained from the base of the skull through the vertex without intravenous contrast. COMPARISON:  Head CT dated 01/03/2021. 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 Slight interval decrease in the white matter edema in the right frontoparietal and posterior temporal region compared to prior CT. Evaluation for mass is very limited in the absence of contrast. There is no acute intracranial hemorrhage. No mass effect or midline shift. No extra-axial fluid collection. Vascular: No hyperdense vessel or unexpected calcification. Skull: No acute calvarial pathology.  Right frontal craniotomy. Sinuses/Orbits: The visualized paranasal sinuses and mastoid air cells are clear. Other: None IMPRESSION: 1. No acute intracranial hemorrhage. 2. 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 Slight interval decrease in the white matter hypodensity in the right frontoparietal and posterior temporal region compared to prior CT. Electronically Signed   By: Anner Crete M.D.   On: 02/18/2021 02:58   DG Chest Port 1 View  Result Date: 02/17/2021 CLINICAL DATA:  Questionable sepsis. Evaluate for abnormality. Altered mental status. EXAM: PORTABLE CHEST 1 VIEW COMPARISON:  01/26/2021 FINDINGS: Shallow inspiration. Heart size and pulmonary vascularity are normal. No airspace disease or consolidation in the lungs. Old left rib fractures with  dystrophic calcification. No pleural effusions. No pneumothorax. Calcified and tortuous aorta. IMPRESSION: No evidence of active pulmonary disease.  Old left rib fractures. Electronically Signed   By: Lucienne Capers M.D.   On: 02/17/2021 23:53   CT RENAL STONE STUDY  Result Date: 02/18/2021 CLINICAL DATA:  Flank  pain EXAM: CT ABDOMEN AND PELVIS WITHOUT CONTRAST TECHNIQUE: Multidetector CT imaging of the abdomen and pelvis was performed following the standard protocol without IV contrast. COMPARISON:  01/26/2021 FINDINGS: Lower chest: No acute abnormality. Hepatobiliary: No focal liver abnormality is seen. No gallstones, gallbladder wall thickening, or biliary dilatation. Pancreas: Unremarkable. 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 Spleen: Unremarkable Adrenals/Urinary Tract: Adrenal glands are unremarkable. Kidneys are normal, without renal calculi, focal lesion, or hydronephrosis. Bladder is unremarkable. Stomach/Bowel: There is moderate stool within the rectal vault with rectal wall thickening and mild perirectal edema possibly reflecting changes of stercoral proctitis. Moderate stool is seen throughout the remainder of the colon. The stomach, small bowel, and large bowel are otherwise unremarkable. Appendix normal. No free intraperitoneal gas or fluid. Vascular/Lymphatic: Moderate aortoiliac atherosclerotic calcification. No aortic aneurysm. No pathologic adenopathy within the abdomen and pelvis. Reproductive: Uterus and bilateral adnexa are unremarkable. Other: Tiny broad-based fat containing umbilical hernia. Musculoskeletal: No acute bone abnormality. No lytic or blastic bone lesion. Degenerative changes are seen within the lumbar spine. IMPRESSION: Moderate stool within the rectal vault with associated rectal wall thickening and perirectal edema suggesting changes of stercoral proctitis. Moderate stool throughout the colon without evidence of obstruction. Normal  noncontrast examination of the kidneys and renal collecting system. Aortic Atherosclerosis (ICD10-I70.0). Electronically Signed   By: Fidela Salisbury M.D.   On: 02/18/2021 04:42        Scheduled Meds:  (feeding supplement) PROSource Plus  30 mL Oral Daily   apixaban  5 mg Oral BID   vitamin C  500 mg Oral q morning   bictegravir-emtricitabine-tenofovir AF  1 tablet Oral Daily   busPIRone  2.5 mg Oral TID   gabapentin  800 mg Oral TID   melatonin  3 mg Oral QHS   mometasone-formoterol  2 puff Inhalation BID   montelukast  10 mg Oral QHS   oxyCODONE  5 mg Oral TID   polyethylene glycol  17 g Oral BID   QUEtiapine Fumarate  150 mg Oral QHS   rOPINIRole  1 mg Oral QHS   senna-docusate  1 tablet Oral QHS   umeclidinium bromide  1 puff Inhalation Q24H   venlafaxine XR  150 mg Oral q morning   Continuous Infusions:  cefTRIAXone (ROCEPHIN)  IV 2 g (02/18/21 2123)   lactated ringers 100 mL/hr at 02/18/21 1735   [START ON 02/20/2021] vancomycin     vancomycin       LOS: 0 days    Time spent: 20 mnutes    Edwin Dada, MD [facility] 02/18/2021, 10:05 PM     Please page though [place] or Epic secure chat:  For password, contact charge nurse

## 2021-02-19 LAB — CBC WITH DIFFERENTIAL/PLATELET
Abs Immature Granulocytes: 0.04 10*3/uL (ref 0.00–0.07)
Basophils Absolute: 0 10*3/uL (ref 0.0–0.1)
Basophils Relative: 0 %
Eosinophils Absolute: 0.2 10*3/uL (ref 0.0–0.5)
Eosinophils Relative: 2 %
HCT: 28.9 % — ABNORMAL LOW (ref 36.0–46.0)
Hemoglobin: 8.9 g/dL — ABNORMAL LOW (ref 12.0–15.0)
Immature Granulocytes: 1 %
Lymphocytes Relative: 21 %
Lymphs Abs: 1.7 10*3/uL (ref 0.7–4.0)
MCH: 27.1 pg (ref 26.0–34.0)
MCHC: 30.8 g/dL (ref 30.0–36.0)
MCV: 87.8 fL (ref 80.0–100.0)
Monocytes Absolute: 0.6 10*3/uL (ref 0.1–1.0)
Monocytes Relative: 8 %
Neutro Abs: 5.7 10*3/uL (ref 1.7–7.7)
Neutrophils Relative %: 68 %
Platelets: 206 10*3/uL (ref 150–400)
RBC: 3.29 MIL/uL — ABNORMAL LOW (ref 3.87–5.11)
RDW: 16.2 % — ABNORMAL HIGH (ref 11.5–15.5)
WBC: 8.3 10*3/uL (ref 4.0–10.5)
nRBC: 0 % (ref 0.0–0.2)

## 2021-02-19 LAB — BASIC METABOLIC PANEL
Anion gap: 7 (ref 5–15)
BUN: 10 mg/dL (ref 8–23)
CO2: 30 mmol/L (ref 22–32)
Calcium: 8.7 mg/dL — ABNORMAL LOW (ref 8.9–10.3)
Chloride: 100 mmol/L (ref 98–111)
Creatinine, Ser: 1.12 mg/dL — ABNORMAL HIGH (ref 0.44–1.00)
GFR, Estimated: 52 mL/min — ABNORMAL LOW (ref >60)
Glucose, Bld: 104 mg/dL — ABNORMAL HIGH (ref 70–99)
Potassium: 3.8 mmol/L (ref 3.5–5.1)
Sodium: 137 mmol/L (ref 135–145)

## 2021-02-19 LAB — URINE CULTURE

## 2021-02-19 MED ORDER — VANCOMYCIN HCL IN DEXTROSE 1-5 GM/200ML-% IV SOLN
1000.0000 mg | Freq: Every day | INTRAVENOUS | Status: DC
  Administered 2021-02-19: 1000 mg via INTRAVENOUS
  Filled 2021-02-19: qty 200

## 2021-02-19 NOTE — Progress Notes (Signed)
PROGRESS NOTE  [name]  DOB: 1946-06-21  PCP: Nolene Ebbs, MD KYH:[id number]  DOA: 02/17/2021  LOS: 1 day  Hospital Day: 3   Chief complaint: Confusion  Brief narrative: [name] is a 75 y.o. 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   In the ER, endorsed dysuria.   CT renal showed no hydronephrosis or stone, possible stercoral colitis.   UA with new nitrites, leuks.  Cr 1.5 from baseline 0.9.    COVID-. Admitted to hospital service for further evaluation management  Subjective: Patient was seen and examined this morning.  Pleasant elderly African-American female.  Lying on bed.  Not in distress.  Continues to have dysuria.  Reports he uses 3 L oxygen at baseline and remains on the same. Hypotensive this morning  Assessment/Plan: Sepsis - POA -Secondary to UTI.  Currently on IV Rocephin.  Urine culture is growing multiple species.  Acute metabolic encephalopathy  -Likely due to sepsis.  At baseline, she has poor functional status but is alert, oriented and interactive per prior notes.  Mental status seems to be better this morning.     Blood culture positive for Staph epidermidis -Staph epidermis is growing in both aerobic and anaerobic bottles.  Continue vancomycin.  I will repeat blood cultures today.   AKI -Baseline Cr 0.9, presented with creatinine elevated 1.49.  Improving with IV fluid. Recent Labs    05/22/20 1033 10/07/20 0218 01/03/21 1028 01/04/21 0148 01/05/21 0339 01/06/21 0353 01/26/21 1342 02/17/21 2244 02/19/21 0418  BUN 24* 18 36* 30* 25* 13 13 13 10   CREATININE 1.11* 1.12* 3.26* 1.70* 1.00 0.86 1.13* 1.49* 1.12*   NSCLC of the LLL metastatic to brain -CT head w/o contrast no bleeding, no clear change.   COPD Chronic hypoxic respiratory  failure on 3 L oxygen by nasal cannula. -Respiratory status stable. -Continue ICS/LABA/LAMA, Singulair   HIV -1 year ago, CD4 ct low and VL high. -Continue Biktarvy  Cerebrovascular disease Hemiparesis at baseline -Supportive care   History of PE -Continue Eliquis   Mood disorder -Continue Buspar, Guetiapine, venlafaxine   Neuropathy -Continue Gabapentin   RLS -Continue ropinirole  Mobility: PT eval obtained. Code Status:   Code Status: DNR  Nutritional status: Body mass index is 32.49 kg/m.     Diet:  Diet Order             Diet Heart Room service appropriate? Yes; Fluid consistency: Thin  Diet effective now                  DVT prophylaxis:   apixaban (ELIQUIS) tablet 5 mg   Antimicrobials: IV Rocephin, vancomycin Fluid: None Consultants: None  FaNonemily Communication: None at bedside  Status is: Inpatient  Remains inpatient appropriate because: Needs repeat blood culture  Dispo: The patient is from: SNF              Anticipated d/c is to: Back to SNF in 1 to 2 days              Patient currently is not medically stable to d/c.   Difficult to place patient No     Infusions:   cefTRIAXone (ROCEPHIN)  IV 2 g (02/18/21 2123)   vancomycin      Scheduled Meds:  (feeding supplement) PROSource Plus  30 mL Oral Daily   apixaban  5 mg Oral BID   vitamin C  500 mg Oral q morning   bictegravir-emtricitabine-tenofovir AF  1 tablet Oral Daily   busPIRone  2.5 mg Oral TID   gabapentin  800 mg Oral TID   melatonin  3 mg Oral QHS   mometasone-formoterol  2 puff Inhalation BID   montelukast  10 mg Oral QHS   oxyCODONE  5 mg Oral TID   polyethylene glycol  17 g Oral BID   QUEtiapine Fumarate  150 mg Oral QHS   rOPINIRole  1 mg Oral QHS   senna-docusate  1 tablet Oral QHS   umeclidinium bromide  1 puff Inhalation Q24H   venlafaxine XR  150 mg Oral q morning    Antimicrobials: Anti-infectives (From admission, onward)    Start     Dose/Rate  Route Frequency Ordered Stop   02/20/21 2200  vancomycin (VANCOREADY) IVPB 1750 mg/350 mL  Status:  Discontinued        1,750 mg 175 mL/hr over 120 Minutes Intravenous Every 48 hours 02/18/21 2057 02/19/21 1010   02/19/21 2200  vancomycin (VANCOCIN) IVPB 1000 mg/200 mL premix        1,000 mg 200 mL/hr over 60 Minutes Intravenous Daily at bedtime 02/19/21 1011     02/18/21 2200  cefTRIAXone (ROCEPHIN) 1 g in sodium chloride 0.9 % 100 mL IVPB  Status:  Discontinued        1 g 200 mL/hr over 30 Minutes Intravenous Every 24 hours 02/18/21 0443 02/18/21 0454   02/18/21 2200  cefTRIAXone (ROCEPHIN) 2 g in sodium chloride 0.9 % 100 mL IVPB        2 g 200 mL/hr over 30 Minutes Intravenous Every 24 hours 02/18/21 0454     02/18/21 2200  vancomycin (VANCOREADY) IVPB 2000 mg/400 mL        2,000 mg 200 mL/hr over 120 Minutes Intravenous  Once 02/18/21 2057 02/19/21 0013   02/18/21 1000  bictegravir-emtricitabine-tenofovir AF (BIKTARVY) 50-200-25 MG per tablet 1 tablet        1 tablet Oral Daily 02/18/21 0443     02/17/21 2300  cefTRIAXone (ROCEPHIN) 2 g in sodium chloride 0.9 % 100 mL IVPB        2 g 200 mL/hr over 30 Minutes Intravenous  Once 02/17/21 2245 02/18/21 0106       PRN meds: acetaminophen **OR** acetaminophen, albuterol, simethicone, tiZANidine   Objective: Vitals:   02/19/21 0544 02/19/21 0910  BP: (!) 96/50 (!) 99/50  Pulse:  78  Resp:  18  Temp:  98.1 F (36.7 C)  SpO2:  99%    Intake/Output Summary (Last 24 hours) at 02/19/2021 1115 Last data filed at 02/19/2021 0800 Gross per 24 hour  Intake 1460 ml  Output 2200 ml  Net -740 ml   Filed Weights   02/18/21 2015 02/18/21 2107  Weight: 99.8 kg 99.8 kg   Weight change:  Body mass index is 32.49 kg/m.   Physical Exam: General exam: Pleasant, elderly African-American female.  Not in distress Skin: No rashes, lesions or ulcers. HEENT: Atraumatic, normocephalic, no obvious bleeding Lungs: Clear to auscultation  bilaterally CVS: Regular rate and rhythm, no murmur GI/Abd soft, nontender, nondistended, bowel sound present CNS: Alert, awake, oriented to place and person Psychiatry: Depressed look Extremities: No pedal edema, no calf tenderness  Data Review: I have personally reviewed the laboratory data and studies available.  Recent Labs  Lab 02/18/21 0411 02/18/21  0502 02/19/21 0418  WBC 4.3 9.3 8.3  NEUTROABS  --   --  5.7  HGB 3.8* 9.2* 8.9*  HCT 13.4* 30.8* 28.9*  MCV 97.1 90.1 87.8  PLT 89* 225 206   Recent Labs  Lab 02/17/21 2244 02/19/21 0418  NA 136 137  K 4.5 3.8  CL 99 100  CO2 30 30  GLUCOSE 107* 104*  BUN 13 10  CREATININE 1.49* 1.12*  CALCIUM 9.1 8.7*    F/u labs ordered Unresulted Labs (From admission, onward)     Start     Ordered   02/20/21 0500  CBC with Differential/Platelet  Daily,   R     Question:  Specimen collection method  Answer:  Lab=Lab collect   02/19/21 1114   02/20/21 8242  Basic metabolic panel  Daily,   R     Question:  Specimen collection method  Answer:  Lab=Lab collect   02/19/21 1114   02/19/21 1113  Culture, blood (Routine X 2) w Reflex to ID Panel  BLOOD CULTURE X 2,   R (with TIMED occurrences)      02/19/21 1113   02/19/21 0500  HIV-1 RNA quant-no reflex-bld  Tomorrow morning,   R        02/18/21 0827   02/19/21 0500  T-helper cells (CD4) count (not at [facility])  Tomorrow morning,   R        02/18/21 0827   02/18/21 1516  Expectorated Sputum Assessment w Gram Stain, Rflx to Resp Cult  Once,   R        02/18/21 1515            Signed, Terrilee Croak, MD [facility] 02/19/2021

## 2021-02-19 NOTE — Progress Notes (Signed)
Pharmacy Antibiotic Note  [name] is a 75 y.o. female admitted on 02/17/2021 with bacteremia.  Pharmacy has been consulted for Vanco dosing.   ID: Sepsis, likely UTI; now afebrile, WBC WNL. Scr down  Vancomycin 1000mg  IV Q 24 hrs. Goal AUC 400-550. Expected AUC: 463 SCr used: 1.12  Vanc 8/23> Rocephin 8/23>>  8/23 urine: mult species 8/22: BC x 2: GPC aerobic bottle 8/22: Staph Epi in both bottles. 8/22 BCID: Staph epi  PTA Biktarvy CTX 8/23 >>  Plan: Change Vancomycin to 1g IV q24h with improved Scr.   Height: 5\' 9"  (175.3 cm) Weight: 99.8 kg (220 lb 0.3 oz) IBW/kg (Calculated) : 66.2  Temp (24hrs), Avg:98.3 F (36.8 C), Min:97.9 F (36.6 C), Max:98.7 F (37.1 C)  Recent Labs  Lab 02/17/21 2244 02/17/21 2300 02/18/21 0411 02/18/21 0502 02/19/21 0418  WBC  --   --  4.3 9.3 8.3  CREATININE 1.49*  --   --   --  1.12*  LATICACIDVEN  --  1.1  --   --   --     Estimated Creatinine Clearance: 56.2 mL/min (A) (by C-G formula based on SCr of 1.12 mg/dL (H)).    No Known Allergies  [name] S. [name], PharmD, BCPS Clinical Staff Pharmacist [email or web address]   [name] 02/19/2021 10:11 AM

## 2021-02-19 NOTE — Evaluation (Signed)
Physical Therapy Evaluation Patient Details Name: [name] MRN: [id number] DOB: 01-Sep-1945 Today's Date: 02/19/2021   History of Present Illness  75 y.o. female presents to [facility] ED on 02/17/2021 from SNF due to confusion and L flank pain. UA consistent with UTI. PMH includes stroke with left-sided hemiparesis, lung cancer with mets to the brain s/p resection, HTN, COPD, asthma, stage III CKD, OSA on CPAP, HLD, PE.  Clinical Impression  Pt presents to PT with deficits in functional mobility, strength, power, ROM, balance. At baseline pt requires assistance for most functional mobility, reporting some success with rolling to left side, otherwise dependent for hoyer lift transfer. Pt demonstrates flaccid LUE with digit contractures as well as L knee and ankle flexion contractures. Pt reports recent onset of R shoulder pain and will benefit from acute PT services to improve strength and ROM or RUE to aide in reducing caregiver burden for mobilizing in bed. PT recommends return to SNF at the time of discharge.    Follow Up Recommendations No PT follow up;SNF (return to SNF for custodial care)    Equipment Recommendations  None recommended by PT    Recommendations for Other Services       Precautions / Restrictions Precautions Precautions: Fall Restrictions Weight Bearing Restrictions: No      Mobility  Bed Mobility Overal bed mobility: Needs Assistance Bed Mobility: Rolling Rolling: Max assist;Mod assist         General bed mobility comments: modA to roll left, maxA to roll R    Transfers                    Ambulation/Gait                Stairs            Wheelchair Mobility    Modified Rankin (Stroke Patients Only)       Balance                                             Pertinent Vitals/Pain Pain Assessment: Faces Faces Pain Scale: Hurts little more Pain Location: R shoulder Pain Descriptors / Indicators: Sore Pain  Intervention(s): Monitored during session    Home Living Family/patient expects to be discharged to:: Skilled nursing facility                 Additional Comments: pt has been residing at [facility] for over 1 year    Prior Function Level of Independence: Needs assistance   Gait / Transfers Assistance Needed: pt reports not leaving the bed recently, pt reports she was previously leaving the bed via hoyer lift transfer for doctors appointments, however last appointment she slide out of wheelchair during transport. Pt does report that she is able to assist with rolling  ADL's / Homemaking Assistance Needed: pt reports the ability to self-feed, requries assistance with all other ADLs        Hand Dominance   Dominant Hand: Right    Extremity/Trunk Assessment   Upper Extremity Assessment Upper Extremity Assessment: LUE deficits/detail;RUE deficits/detail RUE Deficits / Details: grossly 4/5 RUE LUE Deficits / Details: digit contractures to ~90 degrees flexion passively, elbow flexion/extension WFL PROM, shoulder flexion limited to ~80 degrees passively. No AROM noted in LUE.    Lower Extremity Assessment Lower Extremity Assessment: LLE deficits/detail;RLE deficits/detail RLE Deficits / Details: RLE  grossly 4-/5 LLE Deficits / Details: L ankle with ~15 degree PF contracture, knee with ~10 degree flexion contracture. ~10 degrees active plantar flexion and 30 degrees active knee flexion available. Hip flexion of ~30 degrees active motion noted.    Cervical / Trunk Assessment Cervical / Trunk Assessment: Kyphotic  Communication   Communication: No difficulties  Cognition Arousal/Alertness: Awake/alert Behavior During Therapy: WFL for tasks assessed/performed Overall Cognitive Status: No family/caregiver present to determine baseline cognitive functioning                                 General Comments: likely at baseline, some impairements in memory  noted. Pt oriented to month but not date.      General Comments General comments (skin integrity, edema, etc.): VSS on 3L [place]    Exercises     Assessment/Plan    PT Assessment Patient needs continued PT services  PT Problem List Decreased strength;Decreased range of motion;Decreased balance;Decreased activity tolerance;Decreased mobility;Pain       PT Treatment Interventions DME instruction;Functional mobility training;Therapeutic activities;Therapeutic exercise;Balance training;Patient/family education;Wheelchair mobility training;Neuromuscular re-education;Manual techniques    PT Goals (Current goals can be found in the Care Plan section)  Acute Rehab PT Goals Patient Stated Goal: to return to SNF, improve function of RUE and bed mobility PT Goal Formulation: With patient Time For Goal Achievement: 03/05/21 Potential to Achieve Goals: Fair    Frequency Min 2X/week   Barriers to discharge        Co-evaluation               AM-PAC PT "6 Clicks" Mobility  Outcome Measure Help needed turning from your back to your side while in a flat bed without using bedrails?: A Lot Help needed moving from lying on your back to sitting on the side of a flat bed without using bedrails?: Total Help needed moving to and from a bed to a chair (including a wheelchair)?: Total Help needed standing up from a chair using your arms (e.g., wheelchair or bedside chair)?: Total Help needed to walk in hospital room?: Total Help needed climbing 3-5 steps with a railing? : Total 6 Click Score: 7    End of Session Equipment Utilized During Treatment: Oxygen Activity Tolerance: Patient tolerated treatment well Patient left: in bed;with call bell/phone within reach;with bed alarm set Nurse Communication: Mobility status;Need for lift equipment PT Visit Diagnosis: Other abnormalities of gait and mobility (R26.89);Muscle weakness (generalized) (M62.81);Other symptoms and signs involving the nervous  system (R29.898);Pain Pain - Right/Left: Right Pain - part of body: Shoulder    Time: 0917-0950 PT Time Calculation (min) (ACUTE ONLY): 33 min   Charges:   PT Evaluation $PT Eval Low Complexity: 1 Low          Zenaida Niece, PT, DPT Acute Rehabilitation Pager: [phone]   Zenaida Niece 02/19/2021, 10:20 AM

## 2021-02-19 NOTE — TOC Initial Note (Signed)
Transition of Care [facility]) - Initial/Assessment Note    Patient Details  Name: [name] MRN: [id number] Date of Birth: 1945/11/10  [facility]) CM/SW Contact:    Sable Feil, LCSW Phone Number: 02/19/2021, 1:30 PM s Clinical Narrative:  CSW contacted brother [name] [phone]) to determine d/c plan. Mr. [name] confirmed that patient is long-term care at [facility] and will return there at discharge. When asked who is the primary contact, he or his sister, Mr. [name] replied that his sister should not be contacted as she has stage 4 cancer. The other contact is his brother, [name] - [phone], and he was added in Epic as a family contact.                  Expected Discharge Plan: [place] (Back to [facility]) Barriers to Discharge: Continued Medical Work up   Patient Goals and CMS Choice Patient states their goals for this hospitalization and ongoing recovery are:: Per brother Michelle, patient will return to [facility] CMS Medicare.gov Compare Post Acute Care list provided to:: Other (Comment Required) (n/a as patient returning to [facility] at discharge) Choice offered to / list presented to : NA  Expected Discharge Plan and Services Expected Discharge Plan: [place] (Back to [facility]) In-house Referral: Clinical Social Work     Living arrangements for the past 2 months: [place] ([place] - Long-term care)                                      Prior Living Arrangements/Services Living arrangements for the past 2 months: [place] ([place] - Long-term care) Lives with:: Facility Resident [facility] - [facility]) Patient language and need for interpreter reviewed:: No Do you feel safe going back to the place where you live?: Yes      Need for Family Participation in Patient Care: Yes (Comment) Care giver support system  in place?: Yes (comment) (From SNF)   Criminal Activity/Legal Involvement Pertinent to Current Situation/Hospitalization: No - Comment as needed  Activities of Daily Living      Permission Sought/Granted Permission sought to share information with : Other (comment) (n/a as patient not fully oriented. Called brother [name]) Permission granted to share information with :  (Did not talk with patient as she is not fully oriented)              Emotional Assessment Appearance:: Appears stated age (Looked in on patient, but did not enter room) Attitude/Demeanor/Rapport: Unable to Assess Affect (typically observed): Unable to Assess Orientation: : Oriented to Self Alcohol / Substance Use: Alcohol Use, Tobacco Use, Illicit Drugs (Per H&P, patient does not smoke and does not currently drink alcoholic beverages or use illicit drugs) Psych Involvement: No (comment)  Admission diagnosis:  Acute cystitis without hematuria [N30.00] Acute encephalopathy [G93.40] Sepsis ([place]) [A41.9] Patient Active Problem List   Diagnosis Date Noted   Acute encephalopathy 02/18/2021   Acute cystitis without hematuria    Acute metabolic encephalopathy 68/08/2120   Chronic respiratory failure with hypoxia ([place]) 01/07/2021   AKI (acute kidney injury) ([place]) 01/03/2021   Brain metastasis ([place]) 05/22/2020   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   HIV (human immunodeficiency virus infection) ([place]) 01/02/2020   Brain mass 01/02/2020   OSA (obstructive sleep apnea) 01/02/2020   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  Cerebral edema (HCC)    Encephalopathy    HIV disease (HCC)    OSA (obstructive sleep apnea)    Dyslipidemia    History of CVA with residual deficit    Brain tumor ([place]) 11/28/2019   Change in mental status 11/21/2019   Bed bug bite 07/12/2019   DNR (do not resuscitate)    Altered mental status    Acute pulmonary embolism without acute cor pulmonale (HCC)    Acute ischemic right MCA stroke [facility])     Palliative care encounter    Aortic embolism or thrombosis ([place])    CVA (cerebral vascular accident) ([place]) 03/02/2019   COPD (chronic obstructive pulmonary disease) ([place]) 03/01/2019   PE (pulmonary thromboembolism) ([place]) 03/01/2019   Tobacco abuse 03/01/2019   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   CKD (chronic kidney disease), stage III ([place]) 03/01/2019   Generalized weakness 03/01/2019   Fall at home    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[facility])    Splenic infarct    Acute and chronic respiratory failure (acute-on-chronic) ([place]) 02/11/2019   CKD (chronic kidney disease) stage 3, GFR 30-59 ml/min ([place]) 08/12/2016   Lung nodule 10/12/2015   Bronchitis 09/09/2015   History of lung cancer 09/08/2015   Thoracic aorta atherosclerosis ([place]) 09/08/2015   Chronic obstructive pulmonary disease with (acute) exacerbation ([place]) 09/08/2015   Sepsis ([place]) 03/28/2015   CAP (community acquired pneumonia) 03/28/2015   Lower urinary tract infectious disease 03/28/2015   Respiratory failure ([place]) 03/28/2015   Non-small cell lung cancer ([place])    Malignant neoplasm of upper lobe, bronchus or lung 01/03/2014   Acute hypoxemic respiratory failure ([place]) 10/31/2013   COPD with acute exacerbation ([place]) 10/30/2013   COPD exacerbation ([place]) 10/30/2013   Other and unspecified noninfectious gastroenteritis and colitis(558.9) 11/20/2012   Rectal bleed 11/19/2012   Environmental allergies 11/02/2012   Obesity 05/05/2012   Renal insufficiency 05/05/2012   Hyperglycemia 05/05/2012   Asthma 12/10/2011   Cigarette smoker 12/10/2011   Hypercholesteremia 12/11/2010   Hypertension 12/11/2010   ALLERGIC RHINITIS, SEASONAL 07/03/2010   COPD 07/03/2010   HIV (human immunodeficiency virus infection) ([place]) 12/09/2006   DEPRESSION 12/09/2006   BREAST MASS, BENIGN 12/09/2006   OSTEOPOROSIS 12/09/2006   PCP:  Michelle Ebbs, MD Pharmacy:  No Pharmacies Listed    Social Determinants of Health (SDOH) Interventions  No SDOH interventions  requested or needed at this time.  Readmission Risk Interventions No flowsheet data found.

## 2021-02-20 LAB — CBC WITH DIFFERENTIAL/PLATELET
Abs Immature Granulocytes: 0.03 10*3/uL (ref 0.00–0.07)
Basophils Absolute: 0 10*3/uL (ref 0.0–0.1)
Basophils Relative: 0 %
Eosinophils Absolute: 0.3 10*3/uL (ref 0.0–0.5)
Eosinophils Relative: 4 %
HCT: 27.7 % — ABNORMAL LOW (ref 36.0–46.0)
Hemoglobin: 8.3 g/dL — ABNORMAL LOW (ref 12.0–15.0)
Immature Granulocytes: 0 %
Lymphocytes Relative: 26 %
Lymphs Abs: 1.9 10*3/uL (ref 0.7–4.0)
MCH: 26.7 pg (ref 26.0–34.0)
MCHC: 30 g/dL (ref 30.0–36.0)
MCV: 89.1 fL (ref 80.0–100.0)
Monocytes Absolute: 0.6 10*3/uL (ref 0.1–1.0)
Monocytes Relative: 9 %
Neutro Abs: 4.3 10*3/uL (ref 1.7–7.7)
Neutrophils Relative %: 61 %
Platelets: 213 10*3/uL (ref 150–400)
RBC: 3.11 MIL/uL — ABNORMAL LOW (ref 3.87–5.11)
RDW: 16.4 % — ABNORMAL HIGH (ref 11.5–15.5)
WBC: 7.2 10*3/uL (ref 4.0–10.5)
nRBC: 0 % (ref 0.0–0.2)

## 2021-02-20 LAB — BASIC METABOLIC PANEL
Anion gap: 10 (ref 5–15)
BUN: 12 mg/dL (ref 8–23)
CO2: 30 mmol/L (ref 22–32)
Calcium: 9 mg/dL (ref 8.9–10.3)
Chloride: 98 mmol/L (ref 98–111)
Creatinine, Ser: 1.16 mg/dL — ABNORMAL HIGH (ref 0.44–1.00)
GFR, Estimated: 50 mL/min — ABNORMAL LOW (ref >60)
Glucose, Bld: 98 mg/dL (ref 70–99)
Potassium: 3.8 mmol/L (ref 3.5–5.1)
Sodium: 138 mmol/L (ref 135–145)

## 2021-02-20 LAB — HIV-1 RNA QUANT-NO REFLEX-BLD
HIV 1 RNA Quant: 60 copies/mL
LOG10 HIV-1 RNA: 1.778 log10copy/mL

## 2021-02-20 LAB — CULTURE, BLOOD (ROUTINE X 2): Special Requests: ADEQUATE

## 2021-02-20 LAB — T-HELPER CELLS (CD4) COUNT (NOT AT ARMC)
CD4 % Helper T Cell: 28 % — ABNORMAL LOW (ref 33–65)
CD4 T Cell Abs: 481 /uL (ref 400–1790)

## 2021-02-20 NOTE — Plan of Care (Signed)
  Problem: Education: Goal: Knowledge of General Education information will improve Description: Including pain rating scale, medication(s)/side effects and non-pharmacologic comfort measures Outcome: Progressing   Problem: Clinical Measurements: Goal: Ability to maintain clinical measurements within normal limits will improve 02/20/2021 2359 by Karl Bales, RN Outcome: Progressing 02/20/2021 2358 by Karl Bales, RN Outcome: Progressing Goal: Will remain free from infection Outcome: Progressing

## 2021-02-20 NOTE — Progress Notes (Addendum)
PROGRESS NOTE  [name]  DOB: [birth year]  PCP: Nolene Ebbs, MD CBS:[id number]  DOA: 02/17/2021  LOS: 2 days  Hospital Day: 4   Chief complaint: Confusion  Brief narrative: [name] is a 75 y.o. 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   In the ER, endorsed dysuria.   CT renal showed no hydronephrosis or stone, possible stercoral colitis.   UA with new nitrites, leuks.  Cr 1.5 from baseline 0.9.    COVID-. Admitted to hospital service for further evaluation management  Subjective: Patient was seen and examined this morning.  Pleasant elderly African-American female.  Lying on bed.  Not in distress.  On 3 L oxygen by nasal cannula.  Blood pressure persistently 90s.  Assessment/Plan: Sepsis - POA -Secondary to UTI.  Currently on IV Rocephin.  Urine culture is growing multiple species.  Blood culture positive for Staph epidermidis -Staph epidermis is growing in both aerobic and anaerobic bottles.  Repeat blood cultures from 8/24 not reported yet.  We will continue IV Rocephin and stop IV vancomycin today.  Acute metabolic encephalopathy  -Likely due to sepsis.  At baseline, she has poor functional status but is alert, oriented and interactive per prior notes.  Mental status seems improving.  Persistently low blood pressure -Blood pressure in 90s.  Unable to do orthostatics because patient is bedbound and needs help on rolling over.  Not on any antihypertensives.  Low blood pressure is probably her baseline.  AKI -Baseline Cr 0.9, presented with creatinine elevated 1.49.  Improved with IV fluid. 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   COPD Chronic hypoxic respiratory failure on 3 L oxygen by nasal cannula. -Respiratory status stable. -Continue ICS/LABA/LAMA, Singulair   HIV -1 year ago, CD4 ct low and VL high. -Continue Biktarvy  Cerebrovascular disease Hemiparesis at baseline -Supportive care   History of PE -Continue Eliquis   Mood disorder -Continue Buspar, Guetiapine, venlafaxine   Neuropathy -Continue Gabapentin   RLS -Continue ropinirole  Mobility: PT eval obtained. Code Status:   Code Status: DNR  Nutritional status: Body mass index is 32.49 kg/m.     Diet:  Diet Order             Diet Heart Room service appropriate? Yes; Fluid consistency: Thin  Diet effective now                  DVT prophylaxis:   apixaban (ELIQUIS) tablet 5 mg   Antimicrobials: IV Rocephin Fluid: None Consultants: None  [place] Communication: None at bedside  Status is: Inpatient  Remains inpatient appropriate because: Pending repeat blood culture report  Dispo: The patient is from: SNF              Anticipated d/c is to: Back to SNF in 1 to 2 days              Patient currently is not medically stable to d/c.  Difficult to place patient No     Infusions:   cefTRIAXone (ROCEPHIN)  IV 2 g (02/19/21 2154)    Scheduled Meds:  (feeding supplement) PROSource Plus  30 mL Oral Daily   apixaban  5 mg Oral BID   vitamin C  500 mg Oral q morning   bictegravir-emtricitabine-tenofovir AF  1 tablet Oral Daily   busPIRone  2.5 mg Oral TID   gabapentin  800 mg Oral TID   melatonin  3 mg Oral QHS   mometasone-formoterol  2 puff Inhalation BID   montelukast  10 mg Oral QHS   oxyCODONE  5 mg Oral TID   polyethylene glycol  17 g Oral BID   QUEtiapine Fumarate  150 mg Oral QHS   rOPINIRole  1 mg Oral QHS    senna-docusate  1 tablet Oral QHS   umeclidinium bromide  1 puff Inhalation Q24H   venlafaxine XR  150 mg Oral q morning    Antimicrobials: Anti-infectives (From admission, onward)    Start     Dose/Rate Route Frequency Ordered Stop   02/20/21 2200  vancomycin (VANCOREADY) IVPB 1750 mg/350 mL  Status:  Discontinued        1,750 mg 175 mL/hr over 120 Minutes Intravenous Every 48 hours 02/18/21 2057 02/19/21 1010   02/19/21 2200  vancomycin (VANCOCIN) IVPB 1000 mg/200 mL premix  Status:  Discontinued        1,000 mg 200 mL/hr over 60 Minutes Intravenous Daily at bedtime 02/19/21 1011 02/20/21 0938   02/18/21 2200  cefTRIAXone (ROCEPHIN) 1 g in sodium chloride 0.9 % 100 mL IVPB  Status:  Discontinued        1 g 200 mL/hr over 30 Minutes Intravenous Every 24 hours 02/18/21 0443 02/18/21 0454   02/18/21 2200  cefTRIAXone (ROCEPHIN) 2 g in sodium chloride 0.9 % 100 mL IVPB        2 g 200 mL/hr over 30 Minutes Intravenous Every 24 hours 02/18/21 0454     02/18/21 2200  vancomycin (VANCOREADY) IVPB 2000 mg/400 mL        2,000 mg 200 mL/hr over 120 Minutes Intravenous  Once 02/18/21 2057 02/19/21 0013   02/18/21 1000  bictegravir-emtricitabine-tenofovir AF (BIKTARVY) 50-200-25 MG per tablet 1 tablet        1 tablet Oral Daily 02/18/21 0443     02/17/21 2300  cefTRIAXone (ROCEPHIN) 2 g in sodium chloride 0.9 % 100 mL IVPB        2 g 200 mL/hr over 30 Minutes Intravenous  Once 02/17/21 2245 02/18/21 0106       PRN meds: acetaminophen **OR** acetaminophen, albuterol, simethicone, tiZANidine   Objective: Vitals:   02/19/21 2111 02/20/21 0439  BP: (!) 92/49 (!) 99/59  Pulse: 75 75  Resp: 16 18  Temp: 98.4 F (36.9 C) 98.2 F (36.8 C)  SpO2: 96% 100%    Intake/Output Summary (Last 24 hours) at 02/20/2021 1034 Last data filed at 02/20/2021 0801 Gross per 24 hour  Intake 560 ml  Output 1875 ml  Net -1315 ml    Filed Weights   02/18/21 2015 02/18/21 2107 02/19/21 2111  Weight:  99.8 kg 99.8 kg 99.8 kg   Weight change: 0 kg Body mass index is 32.49 kg/m.   Physical Exam: General exam: Pleasant, elderly African-American female.  Not in physical distress.  On 3 L oxygen Skin: No rashes, lesions or ulcers. HEENT: Atraumatic, normocephalic, no obvious bleeding Lungs: Clear to auscultation bilaterally CVS: Regular rate  and rhythm, no murmur GI/Abd soft, nontender, nondistended, bowel sound present CNS: Alert, awake, oriented to place and person Psychiatry: Depressed look Extremities: No pedal edema, no calf tenderness  Data Review: I have personally reviewed the laboratory data and studies available.  Recent Labs  Lab 02/18/21 0411 02/18/21 0502 02/19/21 0418 02/20/21 0400  WBC 4.3 9.3 8.3 7.2  NEUTROABS  --   --  5.7 4.3  HGB 3.8* 9.2* 8.9* 8.3*  HCT 13.4* 30.8* 28.9* 27.7*  MCV 97.1 90.1 87.8 89.1  PLT 89* 225 206 213    Recent Labs  Lab 02/17/21 2244 02/19/21 0418 02/20/21 0400  NA 136 137 138  K 4.5 3.8 3.8  CL 99 100 98  CO2 30 30 30   GLUCOSE 107* 104* 98  BUN 13 10 12   CREATININE 1.49* 1.12* 1.16*  CALCIUM 9.1 8.7* 9.0     F/u labs ordered Unresulted Labs (From admission, onward)     Start     Ordered   02/20/21 0500  CBC with Differential/Platelet  Daily,   R     Question:  Specimen collection method  Answer:  Lab=Lab collect   02/19/21 1114   02/20/21 1610  Basic metabolic panel  Daily,   R     Question:  Specimen collection method  Answer:  Lab=Lab collect   02/19/21 1114   02/19/21 0500  HIV-1 RNA quant-no reflex-bld  Tomorrow morning,   R        02/18/21 0827   02/18/21 1516  Expectorated Sputum Assessment w Gram Stain, Rflx to Resp Cult  Once,   R        02/18/21 1515            Signed, Terrilee Croak, MD [facility] 02/20/2021

## 2021-02-20 NOTE — NC FL2 (Signed)
[place] LEVEL OF CARE SCREENING TOOL     IDENTIFICATION  Patient Name: [name] Birthdate: 26-Mar-1946 Sex: female Admission Date (Current Location): 02/17/2021  [place] and [place] Number:  [name] [id number] [place] and Address:         Provider Number: [id number]  Attending Physician Name and Address:  Terrilee Croak, MD  Relative Name and Phone Number:  [name] - brother, [phone]; [name] - [phone]    Current Level of Care: Hospital Recommended Level of Care: [place] Prior Approval Number:    Date Approved/Denied:   PASRR Number:    Discharge Plan: SNF (Back to [facility])    Current Diagnoses: Patient Active Problem List   Diagnosis Date Noted   Acute encephalopathy 02/18/2021   Acute cystitis without hematuria    Acute metabolic encephalopathy 22/29/7989   Chronic respiratory failure with hypoxia ([place]) 01/07/2021   AKI (acute kidney injury) ([place]) 01/03/2021   Brain metastasis ([place]) 05/22/2020   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   HIV (human immunodeficiency virus infection) ([place]) 01/02/2020   Brain mass 01/02/2020   OSA (obstructive sleep apnea) 01/02/2020   Delusional disorder ([place]) 12/06/2019   Cerebral edema (HCC)    Encephalopathy    HIV disease ([place])    OSA (obstructive sleep apnea)    Dyslipidemia    History of CVA with residual deficit    Brain tumor ([place]) 11/28/2019   Change in mental status 11/21/2019   Bed bug bite 07/12/2019   DNR (do not resuscitate)    Altered mental status    Acute pulmonary embolism without acute cor pulmonale ([place])    Acute ischemic right MCA stroke [facility])    Palliative care encounter    Aortic embolism or thrombosis (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[place])  03/01/2019   CKD (chronic kidney disease), stage III ([place]) 03/01/2019   Generalized weakness 03/01/2019   Fall at home    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[facility])    Splenic infarct    Acute and chronic respiratory failure (acute-on-chronic) ([place]) 02/11/2019   CKD (chronic kidney disease) stage 3, GFR 30-59 ml/min ([place]) 08/12/2016   Lung nodule 10/12/2015   Bronchitis 09/09/2015   History of lung cancer 09/08/2015   Thoracic aorta atherosclerosis ([place]) 09/08/2015   Chronic obstructive pulmonary disease with (acute) exacerbation ([place]) 09/08/2015   Sepsis ([place]) 03/28/2015   CAP (community acquired pneumonia) 03/28/2015   Lower urinary tract infectious disease 03/28/2015   Respiratory failure ([place]) 03/28/2015   Non-small cell lung cancer ([place])    Malignant neoplasm of upper lobe, bronchus or lung 01/03/2014   Acute hypoxemic respiratory failure ([place]) 10/31/2013   COPD with acute exacerbation ([place]) 10/30/2013   COPD exacerbation ([place]) 10/30/2013   Other and unspecified noninfectious gastroenteritis and colitis(558.9) 11/20/2012   Rectal bleed 11/19/2012   Environmental allergies 11/02/2012   Obesity 05/05/2012   Renal insufficiency 05/05/2012   Hyperglycemia 05/05/2012   Asthma 12/10/2011   Cigarette smoker 12/10/2011   Hypercholesteremia 12/11/2010   Hypertension 12/11/2010   ALLERGIC RHINITIS, SEASONAL 07/03/2010   COPD 07/03/2010   HIV (human immunodeficiency virus infection) ([place]) 12/09/2006   DEPRESSION 12/09/2006   BREAST MASS, BENIGN 12/09/2006   OSTEOPOROSIS 12/09/2006    Orientation RESPIRATION BLADDER Height & Weight     Self  O2 (3L oxygen) Incontinent, External catheter Weight:  220 lb 0.3 oz (99.8 kg) Height:  5\' 9"  (175.3 cm)  BEHAVIORAL SYMPTOMS/MOOD NEUROLOGICAL BOWEL NUTRITION STATUS      Continent Diet (Heart-healthy)  AMBULATORY STATUS COMMUNICATION OF NEEDS Skin   Total Care Verbally Normal                       Personal Care Assistance Level of Assistance   Bathing, Feeding, Dressing Bathing Assistance: Maximum assistance Feeding assistance: Limited assistance Dressing Assistance: Maximum assistance     Functional Limitations Info  Sight, Hearing, Speech Sight Info: Adequate Hearing Info: Adequate Speech Info: Adequate    SPECIAL CARE FACTORS FREQUENCY  PT (By licensed PT)     PT Frequency: PT evaluation 8/24              Contractures Contractures Info: Not present    Additional Factors Info  Code Status, Allergies Code Status Info: DNR Allergies Info: No known allergies           Current Medications (02/20/2021):  This is the current hospital active medication list Current Facility-Administered Medications  Medication Dose Route Frequency Provider Last Rate Last Admin   (feeding supplement) PROSource Plus liquid 30 mL  30 mL Oral Daily Rise Patience, MD   30 mL at 02/19/21 1101   acetaminophen (TYLENOL) tablet 650 mg  650 mg Oral Q6H PRN Rise Patience, MD   650 mg at 02/18/21 8315   Or   acetaminophen (TYLENOL) suppository 650 mg  650 mg Rectal Q6H PRN Rise Patience, MD       albuterol (PROVENTIL) (2.5 MG/3ML) 0.083% nebulizer solution 2.5 mg  2.5 mg Nebulization Q4H PRN Rise Patience, MD       apixaban Arne [place]) tablet 5 mg  5 mg Oral BID Rise Patience, MD   5 mg at 02/19/21 2159   ascorbic acid (VITAMIN C) tablet 500 mg  500 mg Oral q morning Rise Patience, MD   500 mg at 02/18/21 1761   bictegravir-emtricitabine-tenofovir AF (BIKTARVY) 50-200-25 MG per tablet 1 tablet  1 tablet Oral Daily Rise Patience, MD   1 tablet at 02/19/21 1058   busPIRone (BUSPAR) tablet 2.5 mg  2.5 mg Oral TID Rise Patience, MD   2.5 mg at 02/19/21 2200   cefTRIAXone (ROCEPHIN) 2 g in sodium chloride 0.9 % 100 mL IVPB  2 g Intravenous Q24H Rise Patience, MD 200 mL/hr at 02/19/21 2154 2 g at 02/19/21 2154   gabapentin (NEURONTIN) capsule 800 mg  800 mg Oral TID Rise Patience,  MD   800 mg at 02/19/21 2159   melatonin tablet 3 mg  3 mg Oral QHS Rise Patience, MD   3 mg at 02/19/21 2159   mometasone-formoterol (DULERA) 200-5 MCG/ACT inhaler 2 puff  2 puff Inhalation BID Rise Patience, MD   2 puff at 02/18/21 0956   montelukast (SINGULAIR) tablet 10 mg  10 mg Oral QHS Rise Patience, MD   10 mg at 02/19/21 2159   oxyCODONE (Oxy IR/ROXICODONE) immediate release tablet 5 mg  5 mg Oral TID Rise Patience, MD   5 mg at 02/19/21 2159   polyethylene glycol (MIRALAX / GLYCOLAX) packet 17 g  17 g Oral BID Rise Patience, MD   17 g at 02/19/21 2159   QUEtiapine (SEROQUEL XR) 24 hr tablet 150 mg  150 mg Oral QHS Rise Patience, MD   150 mg at 02/19/21 2200  rOPINIRole (REQUIP) tablet 1 mg  1 mg Oral QHS Rise Patience, MD   1 mg at 02/19/21 2159   senna-docusate (Senokot-S) tablet 1 tablet  1 tablet Oral QHS Rise Patience, MD   1 tablet at 02/19/21 2159   simethicone (MYLICON) chewable tablet 120 mg  120 mg Oral Q6H PRN Rise Patience, MD       tiZANidine (ZANAFLEX) tablet 4 mg  4 mg Oral Q12H PRN Rise Patience, MD   4 mg at 02/18/21 2014   umeclidinium bromide (INCRUSE ELLIPTA) 62.5 MCG/INH 1 puff  1 puff Inhalation Q24H Edwin Dada, MD       venlafaxine XR (EFFEXOR-XR) 24 hr capsule 150 mg  150 mg Oral q morning Rise Patience, MD   150 mg at 02/19/21 1100     Discharge Medications: Please see discharge summary for a list of discharge medications.  Relevant Imaging Results:  Relevant Lab Results:   Additional Information ss#[id number]  Sable Feil, LCSW

## 2021-02-21 LAB — BASIC METABOLIC PANEL
Anion gap: 7 (ref 5–15)
BUN: 11 mg/dL (ref 8–23)
CO2: 29 mmol/L (ref 22–32)
Calcium: 8.6 mg/dL — ABNORMAL LOW (ref 8.9–10.3)
Chloride: 102 mmol/L (ref 98–111)
Creatinine, Ser: 1.05 mg/dL — ABNORMAL HIGH (ref 0.44–1.00)
GFR, Estimated: 56 mL/min — ABNORMAL LOW (ref >60)
Glucose, Bld: 116 mg/dL — ABNORMAL HIGH (ref 70–99)
Potassium: 3.8 mmol/L (ref 3.5–5.1)
Sodium: 138 mmol/L (ref 135–145)

## 2021-02-21 LAB — CBC WITH DIFFERENTIAL/PLATELET
Abs Immature Granulocytes: 0.03 10*3/uL (ref 0.00–0.07)
Basophils Absolute: 0 10*3/uL (ref 0.0–0.1)
Basophils Relative: 1 %
Eosinophils Absolute: 0.3 10*3/uL (ref 0.0–0.5)
Eosinophils Relative: 5 %
HCT: 29 % — ABNORMAL LOW (ref 36.0–46.0)
Hemoglobin: 8.6 g/dL — ABNORMAL LOW (ref 12.0–15.0)
Immature Granulocytes: 1 %
Lymphocytes Relative: 31 %
Lymphs Abs: 1.6 10*3/uL (ref 0.7–4.0)
MCH: 26.6 pg (ref 26.0–34.0)
MCHC: 29.7 g/dL — ABNORMAL LOW (ref 30.0–36.0)
MCV: 89.8 fL (ref 80.0–100.0)
Monocytes Absolute: 0.5 10*3/uL (ref 0.1–1.0)
Monocytes Relative: 9 %
Neutro Abs: 2.7 10*3/uL (ref 1.7–7.7)
Neutrophils Relative %: 53 %
Platelets: 229 10*3/uL (ref 150–400)
RBC: 3.23 MIL/uL — ABNORMAL LOW (ref 3.87–5.11)
RDW: 16.3 % — ABNORMAL HIGH (ref 11.5–15.5)
WBC: 5.2 10*3/uL (ref 4.0–10.5)
nRBC: 0 % (ref 0.0–0.2)

## 2021-02-21 MED ORDER — CEPHALEXIN 500 MG PO CAPS
500.0000 mg | ORAL_CAPSULE | Freq: Three times a day (TID) | ORAL | Status: DC
  Administered 2021-02-21 – 2021-02-22 (×3): 500 mg via ORAL
  Filled 2021-02-21 (×3): qty 1

## 2021-02-21 NOTE — Care Management Important Message (Signed)
Important Message  Patient Details  Name: [name] MRN: [id number] Date of Birth: 09-18-1945   Medicare Important Message Given:  Yes     Orbie Pyo 02/21/2021, 4:25 PM

## 2021-02-21 NOTE — Progress Notes (Signed)
PROGRESS NOTE  DANITZA SCHOENFELDT  DOB: 12-08-1945  PCP: Nolene Ebbs, MD JHE:[id number]  DOA: 02/17/2021  LOS: 3 days  Hospital Day: 5   Chief complaint: Confusion  Brief narrative: [name] is a 75 y.o. 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   In the ER, endorsed dysuria.   CT renal showed no hydronephrosis or stone, possible stercoral colitis.   UA with new nitrites, leuks.  Cr 1.5 from baseline 0.9.    COVID-. Admitted to hospital service for further evaluation management  Subjective: Patient was seen and examined this morning.  Pleasant.  Not in distress.  No new symptoms.  Blood pressure stable in 90s  Assessment/Plan: Sepsis - POA -Secondary to UTI.  Completed IV Rocephin.  Urine culture grew multiple species.  Blood culture positive for coag negative staph aureus -Staph epidermis and staph hominis growing in both aerobic and anaerobic bottles.  Repeat blood cultures from 8/24 not reported yet.  .  Acute metabolic encephalopathy  -Likely due to sepsis.  At baseline, she has poor functional status but is alert, oriented and interactive per prior notes.  Mental status seems improving.  Persistently low blood pressure -Blood pressure in 90s.  Unable to do orthostatics because patient is bedbound and needs help on rolling over.  Not on any antihypertensives.  Low blood pressure is probably her baseline.  AKI -Baseline Cr 0.9, presented with creatinine elevated 1.49.  Improved with IV fluid. 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   COPD Chronic hypoxic respiratory failure on 3 L oxygen by nasal cannula. -Respiratory status stable. -Continue ICS/LABA/LAMA, Singulair   HIV -1 year ago, CD4 ct low and VL high. -Continue Biktarvy  Cerebrovascular disease Hemiparesis at baseline -Supportive care   History of PE -Continue Eliquis   Mood disorder -Continue Buspar, Guetiapine, venlafaxine   Neuropathy -Continue Gabapentin   RLS -Continue ropinirole  Mobility: PT eval obtained. Code Status:   Code Status: DNR  Nutritional status: Body mass index is 32.49 kg/m.     Diet:  Diet Order             Diet Heart Room service appropriate? Yes; Fluid consistency: Thin  Diet effective now                  DVT prophylaxis:   apixaban (ELIQUIS) tablet 5 mg   Antimicrobials: IV Rocephin Fluid: None Consultants: None  Fa[place]emily Communication: None at bedside  Status is: Inpatient  Remains inpatient appropriate because: Pending repeat blood culture report  Dispo: The patient is from: SNF              Anticipated d/c is to: Back to SNF tomorrow.              Patient currently is not medically stable to d/c.   Difficult to place patient No     Infusions:     Scheduled Meds:  (feeding supplement) PROSource Plus  30 mL Oral Daily   apixaban  5 mg Oral BID   vitamin C  500 mg Oral q morning   bictegravir-emtricitabine-tenofovir AF  1 tablet Oral Daily   busPIRone  2.5 mg Oral TID   cephALEXin  500 mg Oral Q8H   gabapentin  800 mg Oral TID   melatonin  3 mg Oral QHS   mometasone-formoterol  2 puff Inhalation BID   montelukast  10 mg Oral QHS   oxyCODONE  5 mg Oral TID   polyethylene glycol  17 g Oral BID   QUEtiapine Fumarate  150 mg Oral QHS   rOPINIRole  1 mg Oral QHS   senna-docusate  1 tablet Oral QHS   umeclidinium bromide  1 puff Inhalation Q24H   venlafaxine XR  150 mg Oral q morning     Antimicrobials: Anti-infectives (From admission, onward)    Start     Dose/Rate Route Frequency Ordered Stop   02/21/21 2200  cephALEXin (KEFLEX) capsule 500 mg        500 mg Oral Every 8 hours 02/21/21 0928     02/20/21 2200  vancomycin (VANCOREADY) IVPB 1750 mg/350 mL  Status:  Discontinued        1,750 mg 175 mL/hr over 120 Minutes Intravenous Every 48 hours 02/18/21 2057 02/19/21 1010   02/19/21 2200  vancomycin (VANCOCIN) IVPB 1000 mg/200 mL premix  Status:  Discontinued        1,000 mg 200 mL/hr over 60 Minutes Intravenous Daily at bedtime 02/19/21 1011 02/20/21 0938   02/18/21 2200  cefTRIAXone (ROCEPHIN) 1 g in sodium chloride 0.9 % 100 mL IVPB  Status:  Discontinued        1 g 200 mL/hr over 30 Minutes Intravenous Every 24 hours 02/18/21 0443 02/18/21 0454   02/18/21 2200  cefTRIAXone (ROCEPHIN) 2 g in sodium chloride 0.9 % 100 mL IVPB  Status:  Discontinued        2 g 200 mL/hr over 30 Minutes Intravenous Every 24 hours 02/18/21 0454 02/21/21 0928   02/18/21 2200  vancomycin (VANCOREADY) IVPB 2000 mg/400 mL        2,000 mg 200 mL/hr over 120 Minutes Intravenous  Once 02/18/21 2057 02/19/21 0013   02/18/21 1000  bictegravir-emtricitabine-tenofovir AF (BIKTARVY) 50-200-25 MG per tablet 1 tablet        1 tablet Oral Daily 02/18/21 0443     02/17/21 2300  cefTRIAXone (ROCEPHIN) 2 g in sodium chloride 0.9 % 100 mL IVPB        2 g 200 mL/hr over 30 Minutes Intravenous  Once 02/17/21 2245 02/18/21 0106       PRN meds: acetaminophen **OR** acetaminophen, albuterol, simethicone, tiZANidine   Objective: Vitals:   02/21/21 0810 02/21/21 1048  BP:  125/68  Pulse:  70  Resp:  18  Temp:  98.1 F (36.7 C)  SpO2: 98% 96%    Intake/Output Summary (Last 24 hours) at 02/21/2021 1508 Last data filed at 02/21/2021 0600 Gross per 24 hour  Intake 560 ml  Output 850 ml  Net -290 ml   Filed Weights   02/18/21 2107 02/19/21 2111 02/20/21 2104  Weight: 99.8 kg 99.8 kg 99.8 kg    Weight change: 0.001 kg Body mass index is 32.49 kg/m.   Physical Exam: General exam: Pleasant, elderly African-American female.  Not in physical distress.  On 3 L oxygen Skin: No rashes, lesions or ulcers. HEENT: Atraumatic, normocephalic, no obvious bleeding Lungs: Clear to auscultation bilaterally CVS:  Regular rate and rhythm, no murmur GI/Abd soft, nontender, nondistended, bowel sound present CNS: Alert, awake, oriented to place and person Psychiatry: Depressed look Extremities: No pedal edema, no calf tenderness  Data Review: I have personally reviewed the laboratory data and studies available.  Recent Labs  Lab 02/18/21 0411 02/18/21 0502 02/19/21 0418 02/20/21 0400 02/21/21 0312  WBC 4.3 9.3 8.3 7.2 5.2  NEUTROABS  --   --  5.7 4.3 2.7  HGB 3.8* 9.2* 8.9* 8.3* 8.6*  HCT 13.4* 30.8* 28.9* 27.7* 29.0*  MCV 97.1 90.1 87.8 89.1 89.8  PLT 89* 225 206 213 229   Recent Labs  Lab 02/17/21 2244 02/19/21 0418 02/20/21 0400 02/21/21 0312  NA 136 137 138 138  K 4.5 3.8 3.8 3.8  CL 99 100 98 102  CO2 30 30 30 29   GLUCOSE 107* 104* 98 116*  BUN 13 10 12 11   CREATININE 1.49* 1.12* 1.16* 1.05*  CALCIUM 9.1 8.7* 9.0 8.6*    F/u labs ordered Unresulted Labs (From admission, onward)     Start     Ordered   02/20/21 0500  CBC with Differential/Platelet  Daily,   R     Question:  Specimen collection method  Answer:  Lab=Lab collect   02/19/21 1114   02/20/21 4854  Basic metabolic panel  Daily,   R     Question:  Specimen collection method  Answer:  Lab=Lab collect   02/19/21 1114   02/18/21 1516  Expectorated Sputum Assessment w Gram Stain, Rflx to Resp Cult  Once,   R        02/18/21 1515            Signed, Terrilee Croak, MD [facility] 02/21/2021

## 2021-02-21 NOTE — TOC Progression Note (Addendum)
Transition of Care [facility]) - Progression Note    Patient Details  Name: BOSTON CATARINO MRN: [id number] Date of Birth: 02/01/1946  [facility] CM/SW Contact  Sharlet Salina Mila Homer, LCSW Phone Number: 02/21/2021, 12:33 PM  Clinical Narrative: CSW informed by MD that patient will be ready for discharge over the weekend. Ms. Mcneece is a long-term care resident at [facility]. Call made to [facility], admissions liaison with [facility] and informed her of patient's discharge over the weekend. Contacted patient's brother ALPHIA BEHANNA [phone]) and informed him of patient's discharge this weekend.      Expected Discharge Plan: [place] (Back to [facility]) Barriers to Discharge: Continued Medical Work up  Expected Discharge Plan and Services Expected Discharge Plan: [place] (Back to [facility]) In-house Referral: Clinical Social Work     Living arrangements for the past 2 months: [place] ([place] - Long-term care)                                       Social Determinants of Health (SDOH) Interventions  No SDOH interventions requested or needed at this time  Readmission Risk Interventions No flowsheet data found.

## 2021-02-22 LAB — RESP PANEL BY RT-PCR (FLU A&B, COVID) ARPGX2
Influenza A by PCR: NEGATIVE
Influenza B by PCR: NEGATIVE
SARS Coronavirus 2 by RT PCR: NEGATIVE

## 2021-02-22 LAB — BASIC METABOLIC PANEL
Anion gap: 7 (ref 5–15)
BUN: 13 mg/dL (ref 8–23)
CO2: 30 mmol/L (ref 22–32)
Calcium: 8.7 mg/dL — ABNORMAL LOW (ref 8.9–10.3)
Chloride: 101 mmol/L (ref 98–111)
Creatinine, Ser: 1.13 mg/dL — ABNORMAL HIGH (ref 0.44–1.00)
GFR, Estimated: 51 mL/min — ABNORMAL LOW (ref >60)
Glucose, Bld: 124 mg/dL — ABNORMAL HIGH (ref 70–99)
Potassium: 4.3 mmol/L (ref 3.5–5.1)
Sodium: 138 mmol/L (ref 135–145)

## 2021-02-22 LAB — CBC WITH DIFFERENTIAL/PLATELET
Abs Immature Granulocytes: 0.03 10*3/uL (ref 0.00–0.07)
Basophils Absolute: 0 10*3/uL (ref 0.0–0.1)
Basophils Relative: 0 %
Eosinophils Absolute: 0.3 10*3/uL (ref 0.0–0.5)
Eosinophils Relative: 5 %
HCT: 28 % — ABNORMAL LOW (ref 36.0–46.0)
Hemoglobin: 8.3 g/dL — ABNORMAL LOW (ref 12.0–15.0)
Immature Granulocytes: 1 %
Lymphocytes Relative: 29 %
Lymphs Abs: 1.7 10*3/uL (ref 0.7–4.0)
MCH: 26.5 pg (ref 26.0–34.0)
MCHC: 29.6 g/dL — ABNORMAL LOW (ref 30.0–36.0)
MCV: 89.5 fL (ref 80.0–100.0)
Monocytes Absolute: 0.5 10*3/uL (ref 0.1–1.0)
Monocytes Relative: 8 %
Neutro Abs: 3.4 10*3/uL (ref 1.7–7.7)
Neutrophils Relative %: 57 %
Platelets: 251 10*3/uL (ref 150–400)
RBC: 3.13 MIL/uL — ABNORMAL LOW (ref 3.87–5.11)
RDW: 16.2 % — ABNORMAL HIGH (ref 11.5–15.5)
WBC: 5.9 10*3/uL (ref 4.0–10.5)
nRBC: 0 % (ref 0.0–0.2)

## 2021-02-22 NOTE — Discharge Summary (Signed)
Physician Discharge Summary  [name]:[id number] DOB: 09/22/1945 DOA: 02/17/2021  PCP: Nolene Ebbs, MD  Admit date: 02/17/2021 Discharge date: 02/22/2021  Admitted From: SNF Discharge disposition: Back to SNF   Code Status: DNR   Discharge Diagnosis:   Principal Problem:   Acute encephalopathy Active Problems:   HIV (human immunodeficiency virus infection) ([place])   COPD exacerbation ([place])   Sepsis ([place])   Lower urinary tract infectious disease   Chief complaint: Confusion  Brief narrative: [name] is a 75 y.o. 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   In the ER, endorsed dysuria.   CT renal showed no hydronephrosis or stone, possible stercoral colitis.   UA with new nitrites, leuks.  Cr 1.5 from baseline 0.9.    COVID-. Admitted to hospital service for further evaluation management  Subjective: Patient was seen and examined this morning.  Pleasant elderly African-American female.  Lying down in bed.  Not in distress.  Blood pressure stable in 90s.  Hospital course Sepsis - POA -Secondary to UTI.  Completed IV Rocephin.  Urine culture grew multiple species.  Blood culture positive for coag negative staph aureus -Staph epidermis and staph hominis growing in both aerobic and anaerobic bottles.  Repeat blood cultures from 8/24 did not show any growth.  No need of antibiotics at discharge.  Acute metabolic encephalopathy  -Likely due to sepsis.  At baseline, she has poor functional status but is alert, oriented and interactive per prior notes.  Mental status seems improving.  Persistently low blood pressure -Blood pressure in 90s.  Unable to do orthostatics because patient is bedbound and needs help on rolling over.  Not on any antihypertensives.  Low blood  pressure is probably her baseline.  AKI -Baseline Cr 0.9, presented with creatinine elevated 1.49.  Improved with IV fluid. 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   COPD Chronic hypoxic respiratory failure on 3 L oxygen by nasal cannula. -Respiratory status stable. -Continue ICS/LABA/LAMA, Singulair   HIV -1 year ago, CD4 ct low and VL high. -Continue Biktarvy  Cerebrovascular disease Hemiparesis at baseline -Supportive care   History of PE -Continue Eliquis   Mood disorder -Continue Buspar, Guetiapine, venlafaxine   Neuropathy -Continue Gabapentin   RLS -Continue ropinirole   Allergies as of 02/22/2021   No Known Allergies      Medication List     STOP taking these medications    bisacodyl 5 MG EC tablet Commonly known as: DULCOLAX   ondansetron 4 MG disintegrating tablet Commonly known as: Zofran ODT   VITAMIN B50 COMPLEX PO       TAKE these medications    albuterol (2.5 MG/3ML) 0.083% nebulizer solution Commonly known as: PROVENTIL Take 2.5 mg by nebulization every 4 (four) hours as needed for wheezing.   albuterol 108 (90 Base) MCG/ACT inhaler Commonly known as: VENTOLIN HFA Inhale 1 puff into the lungs every 6 (six) hours as needed for shortness of  breath.   ammonium lactate 12 % lotion Commonly known as: LAC-HYDRIN Apply 1 application topically in the morning and at bedtime.   apixaban 5 MG Tabs tablet Commonly known as: ELIQUIS Take 1 tablet (5 mg total) by mouth 2 (two) times daily.   Biktarvy 50-200-25 MG Tabs tablet Generic drug: bictegravir-emtricitabine-tenofovir AF Take 1 tablet by mouth daily.   busPIRone  5 MG tablet Commonly known as: BUSPAR Take 2.5 mg by mouth 3 (three) times daily.   diclofenac Sodium 1 % Gel Commonly known as: VOLTAREN Apply 2 g topically every 4 (four) hours as needed for pain.   feeding supplement (PRO-STAT SUGAR FREE 64) Liqd Take 30 mLs by mouth daily.   Fluticasone-Salmeterol 500-50 MCG/DOSE Aepb Commonly known as: ADVAIR Inhale 1 puff into the lungs every 12 (twelve) hours.   gabapentin 800 MG tablet Commonly known as: NEURONTIN Take 800 mg by mouth 3 (three) times daily.   ibuprofen 800 MG tablet Commonly known as: ADVIL Take 800 mg by mouth every 6 (six) hours as needed (pain).   Melatonin 3 MG Caps Take 3 mg by mouth at bedtime.   montelukast 10 MG tablet Commonly known as: SINGULAIR Take 10 mg by mouth at bedtime.   oxycodone 5 MG capsule Commonly known as: OXY-IR Take 5 mg by mouth 3 (three) times daily. What changed: Another medication with the same name was removed. Continue taking this medication, and follow the directions you see here.   OXYGEN Inhale 3 L into the lungs continuous.   polyethylene glycol 17 g packet Commonly known as: MIRALAX / GLYCOLAX Take 17 g by mouth 2 (two) times daily. Mix in 4-8 oz liquid and drink   QUEtiapine Fumarate 150 MG 24 hr tablet Commonly known as: SEROQUEL XR Take 150 mg by mouth at bedtime.   rOPINIRole 1 MG tablet Commonly known as: Requip Take 1 tablet (1 mg total) by mouth at bedtime.   sennosides-docusate sodium 8.6-50 MG tablet Commonly known as: SENOKOT-S Take 1 tablet by mouth at bedtime.   simethicone 125 MG chewable tablet Commonly known as: MYLICON Chew 761 mg by mouth every 6 (six) hours as needed (bloating/gas).   Spiriva Respimat 2.5 MCG/ACT Aers Generic drug: Tiotropium Bromide Monohydrate INHALE 2 PUFFS INTO THE LUNGS DAILY What changed: when to take this   tiZANidine 4 MG tablet Commonly known as: ZANAFLEX Take 4 mg by mouth every 12 (twelve) hours as needed  (pain).   traMADol 50 MG tablet Commonly known as: ULTRAM Take 1 tablet (50 mg total) by mouth in the morning, at noon, and at bedtime. What changed:  when to take this reasons to take this   venlafaxine XR 150 MG 24 hr capsule Commonly known as: EFFEXOR-XR Take 150 mg by mouth every morning.   vitamin C 500 MG tablet Commonly known as: ASCORBIC ACID Take 500 mg by mouth every morning.        Discharge Instructions:  Diet Recommendation:  Discharge Diet Orders (From admission, onward)     Start     Ordered   02/22/21 0000  Diet - low sodium heart healthy        02/22/21 1148              Follow with Primary MD Nolene Ebbs, MD in 7 days   Get CBC/BMP checked in next visit within 1 week by PCP or SNF MD ( we routinely change or add medications that can affect your baseline labs and fluid status,  therefore we recommend that you get the mentioned basic workup next visit with your PCP, your PCP may decide not to get them or add new tests based on their clinical decision)  On your next visit with your PCP, please Get Medicines reviewed and adjusted.  Please request your PCP  to go over all Hospital Tests and Procedure/Radiological results at the follow up, please get all Hospital records sent to your Prim MD by signing hospital release before you go home.  Activity: As tolerated with Full fall precautions use walker/cane & assistance as needed  For Heart failure patients - Check your Weight same time everyday, if you gain over 2 pounds, or you develop in leg swelling, experience more shortness of breath or chest pain, call your Primary MD immediately. Follow Cardiac Low Salt Diet and 1.5 lit/day fluid restriction.  If you have smoked or chewed Tobacco in the last 2 yrs please stop smoking, stop any regular Alcohol  and or any Recreational drug use.  If you experience worsening of your admission symptoms, develop shortness of breath, life threatening emergency, suicidal  or homicidal thoughts you must seek medical attention immediately by calling 911 or calling your MD immediately  if symptoms less severe.  You Must read complete instructions/literature along with all the possible adverse reactions/side effects for all the Medicines you take and that have been prescribed to you. Take any new Medicines after you have completely understood and accpet all the possible adverse reactions/side effects.   Do not drive, operate heavy machinery, perform activities at heights, swimming or participation in water activities or provide baby sitting services if your were admitted for syncope or siezures until you have seen by Primary MD or a Neurologist and advised to do so again.  Do not drive when taking Pain medications.  Do not take more than prescribed Pain, Sleep and Anxiety Medications  Wear Seat belts while driving.   Please note You were cared for by a hospitalist during your hospital stay. If you have any questions about your discharge medications or the care you received while you were in the hospital after you are discharged, you can call the unit and asked to speak with the hospitalist on call if the hospitalist that took care of you is not available. Once you are discharged, your primary care physician will handle any further medical issues. Please note that NO REFILLS for any discharge medications will be authorized once you are discharged, as it is imperative that you return to your primary care physician (or establish a relationship with a primary care physician if you do not have one) for your aftercare needs so that they can reassess your need for medications and monitor your lab values.    Follow ups:    Follow-up Information     Nolene Ebbs, MD Follow up.   Specialty: Internal Medicine Contact information: [address] [place] [address] [phone]                 Wound care:     Discharge Exam:   Vitals:   02/21/21 2101  02/22/21 0512 02/22/21 0812 02/22/21 0940  BP: (!) 98/56 (!) 105/51  (!) 91/51  Pulse: 83 61 71 65  Resp: 15 16 17 17   Temp: 98.4 F (36.9 C) 98.4 F (36.9 C)  97.9 F (36.6 C)  TempSrc: Oral   Oral  SpO2: 92% 96% 95% 94%  Weight: 99.8 kg     Height:  Body mass index is 32.49 kg/m.  General exam: Pleasant elderly African-American female.  Not in distress Skin: No rashes, lesions or ulcers. HEENT: Atraumatic, normocephalic, no obvious bleeding Lungs: Clear to auscultation bilaterally CVS: Regular rate and rhythm, no murmur GI/Abd soft, nontender, nondistended, bowel sound present CNS: Alert, awake, oriented x3 Psychiatry: Mood appropriate Extremities: No pedal edema, no calf tenderness  Time coordinating discharge: 35 minutes   The results of significant diagnostics from this hospitalization (including imaging, microbiology, ancillary and laboratory) are listed below for reference.    Procedures and Diagnostic Studies:   CT HEAD WO CONTRAST (5MM)  Result Date: 02/18/2021 CLINICAL DATA:  Altered mental status. EXAM: CT HEAD WITHOUT CONTRAST TECHNIQUE: Contiguous axial images were obtained from the base of the skull through the vertex without intravenous contrast. COMPARISON:  Head CT dated 01/03/2021. 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 Slight interval decrease in the white matter edema in the right frontoparietal and posterior temporal region compared to prior CT. Evaluation for mass is very limited in the absence of contrast. There is no acute intracranial hemorrhage. No mass effect or midline shift. No extra-axial fluid collection. Vascular: No hyperdense vessel or unexpected calcification. Skull: No acute calvarial pathology.  Right frontal craniotomy. Sinuses/Orbits: The visualized paranasal sinuses and mastoid air cells are clear. Other: None IMPRESSION: 1. No acute intracranial hemorrhage. 2. Prior right frontal craniotomy  for high right convexity tumor resection. Slight interval decrease in the white matter hypodensity in the right frontoparietal and posterior temporal region compared to prior CT. Electronically Signed   By: Anner Crete M.D.   On: 02/18/2021 02:58   DG Chest Port 1 View  Result Date: 02/17/2021 CLINICAL DATA:  Questionable sepsis. Evaluate for abnormality. Altered mental status. EXAM: PORTABLE CHEST 1 VIEW COMPARISON:  01/26/2021 FINDINGS: Shallow inspiration. Heart size and pulmonary vascularity are normal. No airspace disease or consolidation in the lungs. Old left rib fractures with dystrophic calcification. No pleural effusions. No pneumothorax. Calcified and tortuous aorta. IMPRESSION: No evidence of active pulmonary disease.  Old left rib fractures. Electronically Signed   By: Lucienne Capers M.D.   On: 02/17/2021 23:53   CT RENAL STONE STUDY  Result Date: 02/18/2021 CLINICAL DATA:  Flank pain EXAM: CT ABDOMEN AND PELVIS WITHOUT CONTRAST TECHNIQUE: Multidetector CT imaging of the abdomen and pelvis was performed following the standard protocol without IV contrast. COMPARISON:  01/26/2021 FINDINGS: Lower chest: No acute abnormality. Hepatobiliary: No focal liver abnormality is seen. No gallstones, gallbladder wall thickening, or biliary dilatation. Pancreas: Unremarkable. 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 Spleen: Unremarkable Adrenals/Urinary Tract: Adrenal glands are unremarkable. Kidneys are normal, without renal calculi, focal lesion, or hydronephrosis. Bladder is unremarkable. Stomach/Bowel: There is moderate stool within the rectal vault with rectal wall thickening and mild perirectal edema possibly reflecting changes of stercoral proctitis. Moderate stool is seen throughout the remainder of the colon. The stomach, small bowel, and large bowel are otherwise unremarkable. Appendix normal. No free intraperitoneal gas or fluid. Vascular/Lymphatic:  Moderate aortoiliac atherosclerotic calcification. No aortic aneurysm. No pathologic adenopathy within the abdomen and pelvis. Reproductive: Uterus and bilateral adnexa are unremarkable. Other: Tiny broad-based fat containing umbilical hernia. Musculoskeletal: No acute bone abnormality. No lytic or blastic bone lesion. Degenerative changes are seen within the lumbar spine. IMPRESSION: Moderate stool within the rectal vault with associated rectal wall thickening and perirectal edema suggesting changes of stercoral proctitis. Moderate stool throughout the colon without evidence of obstruction.  Normal noncontrast examination of the kidneys and renal collecting system. Aortic Atherosclerosis (ICD10-I70.0). Electronically Signed   By: Fidela Salisbury M.D.   On: 02/18/2021 04:42     Labs:   Basic Metabolic Panel: Recent Labs  Lab 02/17/21 2244 02/19/21 0418 02/20/21 0400 02/21/21 0312 02/22/21 0212  NA 136 137 138 138 138  K 4.5 3.8 3.8 3.8 4.3  CL 99 100 98 102 101  CO2 30 30 30 29 30   GLUCOSE 107* 104* 98 116* 124*  BUN 13 10 12 11 13   CREATININE 1.49* 1.12* 1.16* 1.05* 1.13*  CALCIUM 9.1 8.7* 9.0 8.6* 8.7*   GFR Estimated Creatinine Clearance: 55.7 mL/min (A) (by C-G formula based on SCr of 1.13 mg/dL (H)). Liver Function Tests: Recent Labs  Lab 02/17/21 2244  AST 25  ALT 9  ALKPHOS 56  BILITOT 0.5  PROT 8.1  ALBUMIN 3.4*   No results for input(s): LIPASE, AMYLASE in the last 168 hours. No results for input(s): AMMONIA in the last 168 hours. Coagulation profile Recent Labs  Lab 02/17/21 2244  INR 1.4*    CBC: Recent Labs  Lab 02/18/21 0502 02/19/21 0418 02/20/21 0400 02/21/21 0312 02/22/21 0212  WBC 9.3 8.3 7.2 5.2 5.9  NEUTROABS  --  5.7 4.3 2.7 3.4  HGB 9.2* 8.9* 8.3* 8.6* 8.3*  HCT 30.8* 28.9* 27.7* 29.0* 28.0*  MCV 90.1 87.8 89.1 89.8 89.5  PLT 225 206 213 229 251   Cardiac Enzymes: No results for input(s): CKTOTAL, CKMB, CKMBINDEX, TROPONINI in the last  168 hours. BNP: Invalid input(s): POCBNP CBG: No results for input(s): GLUCAP in the last 168 hours. D-Dimer No results for input(s): DDIMER in the last 72 hours. Hgb A1c No results for input(s): HGBA1C in the last 72 hours. Lipid Profile No results for input(s): CHOL, HDL, LDLCALC, TRIG, CHOLHDL, LDLDIRECT in the last 72 hours. Thyroid function studies No results for input(s): TSH, T4TOTAL, T3FREE, THYROIDAB in the last 72 hours.  Invalid input(s): FREET3 Anemia work up No results for input(s): VITAMINB12, FOLATE, FERRITIN, TIBC, IRON, RETICCTPCT in the last 72 hours. Microbiology Recent Results (from the past 240 hour(s))  Urine Culture     Status: Abnormal   Collection Time: 02/17/21  1:08 AM   Specimen: In/Out Cath Urine  Result Value Ref Range Status   Specimen Description IN/OUT CATH URINE  Final   Special Requests   Final    NONE Performed at [facility], [address] [place], [place] [address]    Culture MULTIPLE SPECIES PRESENT, SUGGEST RECOLLECTION (A)  Final   Report Status 02/19/2021 FINAL  Final  Blood Culture (routine x 2)     Status: Abnormal   Collection Time: 02/17/21 11:00 PM   Specimen: BLOOD RIGHT HAND  Result Value Ref Range Status   Specimen Description BLOOD RIGHT HAND  Final   Special Requests   Final    BOTTLES DRAWN AEROBIC AND ANAEROBIC Blood Culture results may not be optimal due to an inadequate volume of blood received in culture bottles   Culture  Setup Time   Final    GRAM POSITIVE COCCI IN CLUSTERS IN BOTH AEROBIC AND ANAEROBIC BOTTLES Organism ID to follow CRITICAL RESULT CALLED TO, READ BACK BY AND VERIFIED WITHTillman Sers PHARMD [id number] 02/18/21 A BROWNING    Culture (A)  Final    STAPHYLOCOCCUS EPIDERMIDIS STAPHYLOCOCCUS CAPITIS THE SIGNIFICANCE OF ISOLATING THIS ORGANISM FROM A SINGLE SET OF BLOOD CULTURES WHEN MULTIPLE SETS ARE DRAWN IS  UNCERTAIN. PLEASE NOTIFY THE MICROBIOLOGY DEPARTMENT WITHIN ONE WEEK IF SPECIATION AND  SENSITIVITIES ARE REQUIRED. Performed at [facility], [place] [address] [place], [place] [address]    Report Status 02/20/2021 FINAL  Final  Blood Culture ID Panel (Reflexed)     Status: Abnormal   Collection Time: 02/17/21 11:00 PM  Result Value Ref Range Status   Enterococcus faecalis NOT DETECTED NOT DETECTED Final   Enterococcus Faecium NOT DETECTED NOT DETECTED Final   Listeria monocytogenes NOT DETECTED NOT DETECTED Final   Staphylococcus species DETECTED (A) NOT DETECTED Final    Comment: CRITICAL RESULT CALLED TO, READ BACK BY AND VERIFIED WITHTillman Sers PHARMD [id number] 02/18/21 A BROWNING    Staphylococcus aureus (BCID) NOT DETECTED NOT DETECTED Final   Staphylococcus epidermidis DETECTED (A) NOT DETECTED Final    Comment: Methicillin (oxacillin) resistant coagulase negative staphylococcus. Possible blood culture contaminant (unless isolated from more than one blood culture draw or clinical case suggests pathogenicity). No antibiotic treatment is indicated for blood  culture contaminants. CRITICAL RESULT CALLED TO, READ BACK BY AND VERIFIED WITH: Tillman Sers PHARMD [id number] 02/18/21 A BROWNING    Staphylococcus lugdunensis NOT DETECTED NOT DETECTED Final   Streptococcus species NOT DETECTED NOT DETECTED Final   Streptococcus agalactiae NOT DETECTED NOT DETECTED Final   Streptococcus pneumoniae NOT DETECTED NOT DETECTED Final   Streptococcus pyogenes NOT DETECTED NOT DETECTED Final   A.calcoaceticus-baumannii NOT DETECTED NOT DETECTED Final   Bacteroides fragilis NOT DETECTED NOT DETECTED Final   Enterobacterales NOT DETECTED NOT DETECTED Final   Enterobacter cloacae complex NOT DETECTED NOT DETECTED Final   Escherichia coli NOT DETECTED NOT DETECTED Final   Klebsiella aerogenes NOT DETECTED NOT DETECTED Final   Klebsiella oxytoca NOT DETECTED NOT DETECTED Final   Klebsiella pneumoniae NOT DETECTED NOT DETECTED Final   Proteus species NOT DETECTED NOT DETECTED Final   Salmonella  species NOT DETECTED NOT DETECTED Final   Serratia marcescens NOT DETECTED NOT DETECTED Final   Haemophilus influenzae NOT DETECTED NOT DETECTED Final   Neisseria meningitidis NOT DETECTED NOT DETECTED Final   Pseudomonas aeruginosa NOT DETECTED NOT DETECTED Final   Stenotrophomonas maltophilia NOT DETECTED NOT DETECTED Final   Candida albicans NOT DETECTED NOT DETECTED Final   Candida auris NOT DETECTED NOT DETECTED Final   Candida glabrata NOT DETECTED NOT DETECTED Final   Candida krusei NOT DETECTED NOT DETECTED Final   Candida parapsilosis NOT DETECTED NOT DETECTED Final   Candida tropicalis NOT DETECTED NOT DETECTED Final   Cryptococcus neoformans/gattii NOT DETECTED NOT DETECTED Final   Methicillin resistance mecA/C DETECTED (A) NOT DETECTED Final    Comment: CRITICAL RESULT CALLED TO, READ BACK BY AND VERIFIED WITHTillman Sers [facility] 1245 02/18/21 A BROWNING Performed at [facility], [address], [place], [place] [address]   Blood Culture (routine x 2)     Status: Abnormal   Collection Time: 02/17/21 11:05 PM   Specimen: BLOOD RIGHT FOREARM  Result Value Ref Range Status   Specimen Description BLOOD RIGHT FOREARM  Final   Special Requests   Final    BOTTLES DRAWN AEROBIC AND ANAEROBIC Blood Culture adequate volume   Culture  Setup Time   Final    GRAM POSITIVE COCCI AEROBIC BOTTLE ONLY CRITICAL VALUE NOTED.  VALUE IS CONSISTENT WITH PREVIOUSLY REPORTED AND CALLED VALUE.    Culture (A)  Final    STAPHYLOCOCCUS HOMINIS THE SIGNIFICANCE OF ISOLATING THIS ORGANISM FROM A SINGLE SET OF BLOOD CULTURES WHEN  MULTIPLE SETS ARE DRAWN IS UNCERTAIN. PLEASE NOTIFY THE MICROBIOLOGY DEPARTMENT WITHIN ONE WEEK IF SPECIATION AND SENSITIVITIES ARE REQUIRED. Performed at [facility], [place] [address], [place], [place] [address]    Report Status 02/20/2021 FINAL  Final  Resp Panel by RT-PCR (Flu A&B, Covid) Nasopharyngeal Swab     Status: None   Collection Time: 02/17/21 11:06 PM    Specimen: Nasopharyngeal Swab; Nasopharyngeal(NP) swabs in vial transport medium  Result Value Ref Range Status   SARS Coronavirus 2 by RT PCR NEGATIVE NEGATIVE Final    Comment: (NOTE) SARS-CoV-2 target nucleic acids are NOT DETECTED.  The SARS-CoV-2 RNA is generally detectable in upper respiratory specimens during the acute phase of infection. The lowest concentration of SARS-CoV-2 viral copies this assay can detect is 138 copies/mL. A negative result does not preclude SARS-Cov-2 infection and should not be used as the sole basis for treatment or other patient management decisions. A negative result may occur with  improper specimen collection/handling, submission of specimen other than nasopharyngeal swab, presence of viral mutation(s) within the areas targeted by this assay, and inadequate number of viral copies(<138 copies/mL). A negative result must be combined with clinical observations, patient history, and epidemiological information. The expected result is Negative.  Fact Sheet for Patients:  EntrepreneurPulse.com.au  Fact Sheet for Healthcare Providers:  IncredibleEmployment.be  This test is no t yet approved or cleared by the [place] FDA and  has been authorized for detection and/or diagnosis of SARS-CoV-2 by FDA under an Emergency Use Authorization (EUA). This EUA will remain  in effect (meaning this test can be used) for the duration of the COVID-19 declaration under Section 564(b)(1) of the Act, 21 U.S.C.section 360bbb-3(b)(1), unless the authorization is terminated  or revoked sooner.       Influenza A by PCR NEGATIVE NEGATIVE Final   Influenza B by PCR NEGATIVE NEGATIVE Final    Comment: (NOTE) The Xpert Xpress SARS-CoV-2/FLU/RSV plus assay is intended as an aid in the diagnosis of influenza from Nasopharyngeal swab specimens and should not be used as a sole basis for treatment. Nasal washings and aspirates are  unacceptable for Xpert Xpress SARS-CoV-2/FLU/RSV testing.  Fact Sheet for Patients: EntrepreneurPulse.com.au  Fact Sheet for Healthcare Providers: IncredibleEmployment.be  This test is not yet approved or cleared by the [place] FDA and has been authorized for detection and/or diagnosis of SARS-CoV-2 by FDA under an Emergency Use Authorization (EUA). This EUA will remain in effect (meaning this test can be used) for the duration of the COVID-19 declaration under Section 564(b)(1) of the Act, 21 U.S.C. section 360bbb-3(b)(1), unless the authorization is terminated or revoked.  Performed at [facility], [place] [address], [place], [place] [address]   Culture, blood (Routine X 2) w Reflex to ID Panel     Status: None (Preliminary result)   Collection Time: 02/19/21 11:46 AM   Specimen: BLOOD RIGHT HAND  Result Value Ref Range Status   Specimen Description BLOOD RIGHT HAND  Final   Special Requests   Final    BOTTLES DRAWN AEROBIC ONLY Blood Culture results may not be optimal due to an inadequate volume of blood received in culture bottles   Culture   Final    NO GROWTH 2 DAYS Performed at [facility], [place] [address], [place], [place] [address]    Report Status PENDING  Incomplete  Culture, blood (Routine X 2) w Reflex to ID Panel     Status: None (Preliminary result)  Collection Time: 02/19/21 11:47 AM   Specimen: BLOOD LEFT HAND  Result Value Ref Range Status   Specimen Description BLOOD LEFT HAND  Final   Special Requests   Final    BOTTLES DRAWN AEROBIC ONLY Blood Culture results may not be optimal due to an inadequate volume of blood received in culture bottles   Culture   Final    NO GROWTH 2 DAYS Performed at [facility], [place] [address], [place], [place] [address]    Report Status PENDING  Incomplete  Resp Panel by RT-PCR (Flu A&B, Covid) Nasopharyngeal Swab     Status: None   Collection Time: 02/22/21 10:35 AM    Specimen: Nasopharyngeal Swab; Nasopharyngeal(NP) swabs in vial transport medium  Result Value Ref Range Status   SARS Coronavirus 2 by RT PCR NEGATIVE NEGATIVE Final    Comment: (NOTE) SARS-CoV-2 target nucleic acids are NOT DETECTED.  The SARS-CoV-2 RNA is generally detectable in upper respiratory specimens during the acute phase of infection. The lowest concentration of SARS-CoV-2 viral copies this assay can detect is 138 copies/mL. A negative result does not preclude SARS-Cov-2 infection and should not be used as the sole basis for treatment or other patient management decisions. A negative result may occur with  improper specimen collection/handling, submission of specimen other than nasopharyngeal swab, presence of viral mutation(s) within the areas targeted by this assay, and inadequate number of viral copies(<138 copies/mL). A negative result must be combined with clinical observations, patient history, and epidemiological information. The expected result is Negative.  Fact Sheet for Patients:  EntrepreneurPulse.com.au  Fact Sheet for Healthcare Providers:  IncredibleEmployment.be  This test is no t yet approved or cleared by the [place] FDA and  has been authorized for detection and/or diagnosis of SARS-CoV-2 by FDA under an Emergency Use Authorization (EUA). This EUA will remain  in effect (meaning this test can be used) for the duration of the COVID-19 declaration under Section 564(b)(1) of the Act, 21 U.S.C.section 360bbb-3(b)(1), unless the authorization is terminated  or revoked sooner.       Influenza A by PCR NEGATIVE NEGATIVE Final   Influenza B by PCR NEGATIVE NEGATIVE Final    Comment: (NOTE) The Xpert Xpress SARS-CoV-2/FLU/RSV plus assay is intended as an aid in the diagnosis of influenza from Nasopharyngeal swab specimens and should not be used as a sole basis for treatment. Nasal washings and aspirates are  unacceptable for Xpert Xpress SARS-CoV-2/FLU/RSV testing.  Fact Sheet for Patients: EntrepreneurPulse.com.au  Fact Sheet for Healthcare Providers: IncredibleEmployment.be  This test is not yet approved or cleared by the [place] FDA and has been authorized for detection and/or diagnosis of SARS-CoV-2 by FDA under an Emergency Use Authorization (EUA). This EUA will remain in effect (meaning this test can be used) for the duration of the COVID-19 declaration under Section 564(b)(1) of the Act, 21 U.S.C. section 360bbb-3(b)(1), unless the authorization is terminated or revoked.  Performed at [facility], [place] [address] [place], [place] [address]      Signed: Terrilee Croak  [facility] 02/22/2021, 11:48 AM

## 2021-02-22 NOTE — TOC Transition Note (Signed)
Transition of Care [facility]) - CM/SW Discharge Note   Patient Details  Name: [name] MRN: [id number] Date of Birth: Dec 30, 1945  [facility] CM/SW Contact:  Coralee Pesa, [place] Phone Number: 02/22/2021, 11:55 AM   Clinical Narrative:    Pt to be transported to [facility] via [facility].  Nurse to call report to [phone].   Final next level of care: Skilled Nursing Facility Barriers to Discharge: Barriers Resolved   Patient Goals and CMS Choice Patient states their goals for this hospitalization and ongoing recovery are:: Per brother BJ, patient will return to [facility] CMS Medicare.gov Compare Post Acute Care list provided to:: Other (Comment Required) (n/a as patient returning to [facility] at discharge) Choice offered to / list presented to : NA  Discharge Placement              Patient chooses bed at: [facility] Patient to be transferred to facility by: [place] Name of family member notified: BJ Patient and family notified of of transfer: 02/22/21  Discharge Plan and Services In-house Referral: Clinical Social Work                                   Social Determinants of Health ([place]) Interventions     Readmission Risk Interventions No flowsheet data found.

## 2021-02-22 NOTE — Progress Notes (Signed)
Attempted to give report to [facility], no response. [facility] here to pick up patient.

## 2021-02-22 NOTE — Plan of Care (Signed)
Patient discharged to [facility]

## 2021-02-24 LAB — CULTURE, BLOOD (ROUTINE X 2)
Culture: NO GROWTH
Culture: NO GROWTH

## 2021-03-11 ENCOUNTER — Other Ambulatory Visit: Payer: Self-pay

## 2021-03-11 ENCOUNTER — Non-Acute Institutional Stay: Payer: Medicaid Other | Admitting: Hospice

## 2021-03-11 ENCOUNTER — Emergency Department (HOSPITAL_COMMUNITY): Payer: Medicare HMO

## 2021-03-11 ENCOUNTER — Emergency Department (HOSPITAL_COMMUNITY)
Admission: EM | Admit: 2021-03-11 | Discharge: 2021-03-12 | Disposition: A | Discharge status: Home / Self Care | Payer: Medicare HMO | Attending: Emergency Medicine | Admitting: Emergency Medicine

## 2021-03-11 DIAGNOSIS — J441 Chronic obstructive pulmonary disease with (acute) exacerbation: Secondary | ICD-10-CM | POA: Diagnosis not present

## 2021-03-11 DIAGNOSIS — N183 Chronic kidney disease, stage 3 unspecified: Secondary | ICD-10-CM | POA: Insufficient documentation

## 2021-03-11 DIAGNOSIS — I129 Hypertensive chronic kidney disease with stage 1 through stage 4 chronic kidney disease, or unspecified chronic kidney disease: Secondary | ICD-10-CM | POA: Insufficient documentation

## 2021-03-11 DIAGNOSIS — Z21 Asymptomatic human immunodeficiency virus [HIV] infection status: Secondary | ICD-10-CM | POA: Insufficient documentation

## 2021-03-11 DIAGNOSIS — Z20822 Contact with and (suspected) exposure to covid-19: Secondary | ICD-10-CM | POA: Insufficient documentation

## 2021-03-11 DIAGNOSIS — Z79899 Other long term (current) drug therapy: Secondary | ICD-10-CM | POA: Insufficient documentation

## 2021-03-11 DIAGNOSIS — Z7951 Long term (current) use of inhaled steroids: Secondary | ICD-10-CM | POA: Insufficient documentation

## 2021-03-11 DIAGNOSIS — J45909 Unspecified asthma, uncomplicated: Secondary | ICD-10-CM | POA: Insufficient documentation

## 2021-03-11 DIAGNOSIS — Z7901 Long term (current) use of anticoagulants: Secondary | ICD-10-CM | POA: Diagnosis not present

## 2021-03-11 DIAGNOSIS — R042 Hemoptysis: Secondary | ICD-10-CM | POA: Insufficient documentation

## 2021-03-11 DIAGNOSIS — Z85118 Personal history of other malignant neoplasm of bronchus and lung: Secondary | ICD-10-CM | POA: Insufficient documentation

## 2021-03-11 LAB — CBC WITH DIFFERENTIAL/PLATELET
Abs Immature Granulocytes: 0.03 10*3/uL (ref 0.00–0.07)
Basophils Absolute: 0 10*3/uL (ref 0.0–0.1)
Basophils Relative: 0 %
Eosinophils Absolute: 0.3 10*3/uL (ref 0.0–0.5)
Eosinophils Relative: 5 %
HCT: 37.1 % (ref 36.0–46.0)
Hemoglobin: 10.9 g/dL — ABNORMAL LOW (ref 12.0–15.0)
Immature Granulocytes: 0 %
Lymphocytes Relative: 33 %
Lymphs Abs: 2.2 10*3/uL (ref 0.7–4.0)
MCH: 26.7 pg (ref 26.0–34.0)
MCHC: 29.4 g/dL — ABNORMAL LOW (ref 30.0–36.0)
MCV: 90.9 fL (ref 80.0–100.0)
Monocytes Absolute: 0.4 10*3/uL (ref 0.1–1.0)
Monocytes Relative: 7 %
Neutro Abs: 3.8 10*3/uL (ref 1.7–7.7)
Neutrophils Relative %: 55 %
Platelets: 171 10*3/uL (ref 150–400)
RBC: 4.08 MIL/uL (ref 3.87–5.11)
RDW: 16.1 % — ABNORMAL HIGH (ref 11.5–15.5)
WBC: 6.8 10*3/uL (ref 4.0–10.5)
nRBC: 0 % (ref 0.0–0.2)

## 2021-03-11 LAB — BASIC METABOLIC PANEL
Anion gap: 15 (ref 5–15)
BUN: 19 mg/dL (ref 8–23)
CO2: 20 mmol/L — ABNORMAL LOW (ref 22–32)
Calcium: 9.4 mg/dL (ref 8.9–10.3)
Chloride: 105 mmol/L (ref 98–111)
Creatinine, Ser: 1.15 mg/dL — ABNORMAL HIGH (ref 0.44–1.00)
GFR, Estimated: 50 mL/min — ABNORMAL LOW (ref >60)
Glucose, Bld: 92 mg/dL (ref 70–99)
Potassium: 5 mmol/L (ref 3.5–5.1)
Sodium: 140 mmol/L (ref 135–145)

## 2021-03-11 LAB — TROPONIN I (HIGH SENSITIVITY): Troponin I (High Sensitivity): 5 ng/L (ref <18)

## 2021-03-11 NOTE — Progress Notes (Signed)
[place] WPT [facility]) Hospital Liaison note:  This patient is currently enrolled in [facility] outpatient-based Palliative Care. Will continue to follow for disposition.  Please call with any outpatient palliative questions or concerns.  Thank you, Lorelee Market, LPN [facility] Liaison [phone]

## 2021-03-11 NOTE — ED Triage Notes (Signed)
Pt here from [facility] care with c/o of hemoptysis. Complains of hip and chest pain. On 2 liters [place] at baseline.

## 2021-03-11 NOTE — ED Provider Notes (Addendum)
[facility] EMERGENCY DEPARTMENT Provider Note   CSN: [id number] Arrival date & time: 03/11/21  1647     History Chief Complaint  Patient presents with   Hemoptysis    [name] is a 75 y.o. female.  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  Patient is a poor historian at baseline.  I attempted to call the facility from which she came from, [facility] but unfortunately there was no answer.  Patient states that she has been having hemoptysis for several weeks now, has been followed by infectious disease and pulmonology.  She states she was started on some antibiotics and steroids and it did get better, however the hemoptysis has returned.  She states initially the color of her sputum was dark red, however now it has become more pink and frothy.  She denies any chest pain, shortness of breath, pleuritic symptoms.  She does not seem to be aware of her diagnosis of lung adenocarcinoma as she states that the mass is being "looked into".  He states that she has been compliant with her Biktarvy and her anticoagulant.  She denies any fevers or chills.  States she gets frequent "bladder issues", and per chart review she does have frequent admissions for UTI's. She cannot tell me exactly what prompted her to come the ER today.  She states that she has brothers and a sister, but they are not overly involved in her medical care.  She does not have any children or known medical decision-maker as per the patient's report.   Past Medical History:  Diagnosis Date   Asthma    Atypical squamous cells of undetermined significance (ASCUS) on Papanicolaou smear of cervix    Bed bug bite 07/12/2019   Cancer (HCC)    COPD (chronic obstructive pulmonary disease)  (HCC)    H/O drainage of abscess    left axilla, from left breast   Hep B w/o coma    HIV (human immunodeficiency virus infection) ([place])    Hyperlipidemia    Lung cancer ([place]) 12/14/13   LUL Adenocarcinoma   Neuromuscular disorder (HCC)    fingers/feet neuropathy   Non-small cell lung cancer ([place])    Renal disorder    S/P radiation therapy 01/26/14-02/02/14   sbrt  lt upper lung-54Gy/27fx   Stroke [facility])     Patient Active Problem List   Diagnosis Date Noted   Acute encephalopathy 02/18/2021   Acute cystitis without hematuria    Acute metabolic encephalopathy 56/21/3086   Chronic respiratory failure with hypoxia ([place]) 01/07/2021   AKI (acute kidney injury) ([place]) 01/03/2021   Brain metastasis ([place]) 05/22/2020   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   HIV (human immunodeficiency virus infection) ([place]) 01/02/2020   Brain mass 01/02/2020   OSA (obstructive sleep apnea) 01/02/2020   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   Cerebral edema (HCC)    Encephalopathy    HIV disease (HCC)    OSA (obstructive sleep apnea)    Dyslipidemia    History of CVA with residual deficit    Brain tumor ([place]) 11/28/2019   Change in mental status 11/21/2019   Bed bug bite 07/12/2019   DNR (do not resuscitate)    Altered mental status    Acute pulmonary embolism without acute cor pulmonale ([place])  Acute ischemic right MCA stroke [facility])    Palliative care encounter    Aortic embolism or thrombosis ([place])    CVA (cerebral vascular accident) ([place]) 03/02/2019   COPD (chronic obstructive pulmonary disease) ([place]) 03/01/2019   PE (pulmonary thromboembolism) ([place]) 03/01/2019   Tobacco abuse 03/01/2019   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   CKD (chronic kidney disease), stage III ([place]) 03/01/2019   Generalized weakness 03/01/2019   Fall at home    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[facility])    Splenic infarct    Acute and chronic respiratory failure (acute-on-chronic) ([place]) 02/11/2019   CKD (chronic kidney disease) stage 3, GFR 30-59 ml/min  ([place]) 08/12/2016   Lung nodule 10/12/2015   Bronchitis 09/09/2015   History of lung cancer 09/08/2015   Thoracic aorta atherosclerosis ([place]) 09/08/2015   Chronic obstructive pulmonary disease with (acute) exacerbation ([place]) 09/08/2015   Sepsis ([place]) 03/28/2015   CAP (community acquired pneumonia) 03/28/2015   Lower urinary tract infectious disease 03/28/2015   Respiratory failure ([place]) 03/28/2015   Non-small cell lung cancer ([place])    Malignant neoplasm of upper lobe, bronchus or lung 01/03/2014   Acute hypoxemic respiratory failure ([place]) 10/31/2013   COPD with acute exacerbation ([place]) 10/30/2013   COPD exacerbation ([place]) 10/30/2013   Other and unspecified noninfectious gastroenteritis and colitis(558.9) 11/20/2012   Rectal bleed 11/19/2012   Environmental allergies 11/02/2012   Obesity 05/05/2012   Renal insufficiency 05/05/2012   Hyperglycemia 05/05/2012   Asthma 12/10/2011   Cigarette smoker 12/10/2011   Hypercholesteremia 12/11/2010   Hypertension 12/11/2010   ALLERGIC RHINITIS, SEASONAL 07/03/2010   COPD 07/03/2010   HIV (human immunodeficiency virus infection) ([place]) 12/09/2006   DEPRESSION 12/09/2006   BREAST MASS, BENIGN 12/09/2006   OSTEOPOROSIS 12/09/2006    Past Surgical History:  Procedure Laterality Date   ABCESS DRAINAGE     abcess under Left arm, abcess from L breast   APPLICATION OF CRANIAL NAVIGATION Right 11/28/2019   Procedure: APPLICATION OF CRANIAL NAVIGATION;  Surgeon: [name], MD;  Location: [facility];  Service: Neurosurgery;  Laterality: Right;   BRAIN SURGERY     BUBBLE STUDY  03/08/2019   Procedure: BUBBLE STUDY;  Surgeon: [name], MD;  Location: [facility];  Service: Cardiovascular;;   CRANIOTOMY Right 11/28/2019   Procedure: Right Craniotomy for Tumor Resection;  Surgeon: [name], MD;  Location: [facility];  Service: Neurosurgery;  Laterality: Right;   CRANIOTOMY     removal of abnormal cells on uterus     TEE WITHOUT  CARDIOVERSION N/A 03/08/2019   Procedure: TRANSESOPHAGEAL ECHOCARDIOGRAM (TEE);  Surgeon: [name], MD;  Location: [facility];  Service: Cardiovascular;  Laterality: N/A;   thyroid gland removed       OB History   No obstetric history on file.     Family History  Problem Relation Age of Onset   Pneumonia Mother    Hypertension Sister    Diabetes Sister    Cancer Sister        pancreatic and lung   Cancer Brother        pancreatic   Cancer Brother        pancreatic    Social History   Tobacco Use   Smoking status: Never   Smokeless tobacco: Never   Tobacco comments:    quit date the day she starts her radiation at the [facility]  Substance Use Topics   Alcohol use: Not Currently    Comment: none in 20 years   Drug use:  Not Currently    Comment: none in 20 years    Home Medications Prior to Admission medications   Medication Sig Start Date End Date Taking? Authorizing Provider  albuterol (PROVENTIL) (2.5 MG/3ML) 0.083% nebulizer solution Take 2.5 mg by nebulization every 4 (four) hours as needed for wheezing.    [provider]  albuterol (VENTOLIN HFA) 108 (90 Base) MCG/ACT inhaler Inhale 1 puff into the lungs every 6 (six) hours as needed for shortness of breath.  12/23/19   [provider]  Amino Acids-Protein Hydrolys (FEEDING SUPPLEMENT, PRO-STAT SUGAR FREE 64,) LIQD Take 30 mLs by mouth daily.    [provider]  ammonium lactate (LAC-HYDRIN) 12 % lotion Apply 1 application topically in the morning and at bedtime.    [provider]  apixaban (ELIQUIS) 5 MG TABS tablet Take 1 tablet (5 mg total) by mouth 2 (two) times daily. 03/13/19   Hosie Poisson, MD  bictegravir-emtricitabine-tenofovir AF (BIKTARVY) 50-200-25 MG TABS tablet Take 1 tablet by mouth daily. 07/12/19   Truman Hayward, MD  busPIRone (BUSPAR) 5 MG tablet Take 2.5 mg by mouth 3 (three) times daily.    [provider]  diclofenac Sodium (VOLTAREN)  1 % GEL Apply 2 g topically every 4 (four) hours as needed for pain. 11/09/19   [provider]  Fluticasone-Salmeterol (ADVAIR) 500-50 MCG/DOSE AEPB Inhale 1 puff into the lungs every 12 (twelve) hours. 09/04/19   [provider]  gabapentin (NEURONTIN) 800 MG tablet Take 800 mg by mouth 3 (three) times daily. 10/26/19   [provider]  ibuprofen (ADVIL) 800 MG tablet Take 800 mg by mouth every 6 (six) hours as needed (pain).    [provider]  Melatonin 3 MG CAPS Take 3 mg by mouth at bedtime.    [provider]  montelukast (SINGULAIR) 10 MG tablet Take 10 mg by mouth at bedtime.  09/04/19   [provider]  oxycodone (OXY-IR) 5 MG capsule Take 5 mg by mouth 3 (three) times daily.    [provider]  OXYGEN Inhale 3 L into the lungs continuous.    [provider]  polyethylene glycol (MIRALAX / GLYCOLAX) 17 g packet Take 17 g by mouth 2 (two) times daily. Mix in 4-8 oz liquid and drink    [provider]  QUEtiapine Fumarate (SEROQUEL XR) 150 MG 24 hr tablet Take 150 mg by mouth at bedtime. 11/09/19   [provider]  rOPINIRole (REQUIP) 1 MG tablet Take 1 tablet (1 mg total) by mouth at bedtime. 05/20/19   Ward, Delice Bison, DO  sennosides-docusate sodium (SENOKOT-S) 8.6-50 MG tablet Take 1 tablet by mouth at bedtime.    [provider]  simethicone (MYLICON) 867 MG chewable tablet Chew 125 mg by mouth every 6 (six) hours as needed (bloating/gas).    [provider]  SPIRIVA RESPIMAT 2.5 MCG/ACT AERS INHALE 2 PUFFS INTO THE LUNGS DAILY Patient taking differently: Inhale 2 puffs into the lungs every evening. 09/24/14   Rigoberto Noel, MD  tiZANidine (ZANAFLEX) 4 MG tablet Take 4 mg by mouth every 12 (twelve) hours as needed (pain). 11/08/19   [provider]  traMADol (ULTRAM) 50 MG tablet Take 1 tablet (50 mg total) by mouth in the morning, at noon, and at bedtime. Patient taking  differently: Take 50 mg by mouth every 8 (eight) hours as needed (shoulder pain). 01/09/21   Darliss Cheney, MD  venlafaxine XR (EFFEXOR-XR) 150 MG 24 hr  capsule Take 150 mg by mouth every morning.    [provider]  vitamin C (ASCORBIC ACID) 500 MG tablet Take 500 mg by mouth every morning.    [provider]    Allergies    Patient has no known allergies.  Review of Systems   Review of Systems Ten systems reviewed and are negative for acute change, except as noted in the HPI.   Physical Exam Updated Vital Signs BP 119/72   Pulse 71   Temp 98.5 F (36.9 C) (Oral)   Resp 17   Ht 5\' 9"  (1.753 m)   Wt 99 kg   SpO2 97%   BMI 32.23 kg/m   Physical Exam Vitals reviewed.  Constitutional:      Appearance: Normal appearance. She is ill-appearing (chronically ill appearing). She is not diaphoretic.  HENT:     Head: Normocephalic and atraumatic.  Eyes:     General:        Right eye: No discharge.        Left eye: No discharge.     Extraocular Movements: Extraocular movements intact.     Conjunctiva/sclera: Conjunctivae normal.     Pupils: Pupils are equal, round, and reactive to light.  Cardiovascular:     Rate and Rhythm: Normal rate and regular rhythm.     Pulses: Normal pulses.     Heart sounds: Normal heart sounds.  Pulmonary:     Effort: Pulmonary effort is normal.     Breath sounds: Normal breath sounds. No wheezing or rhonchi.  Abdominal:     General: Abdomen is flat. Bowel sounds are normal. There is no distension.     Palpations: Abdomen is soft. There is no mass.     Tenderness: There is no abdominal tenderness. There is no right CVA tenderness, left CVA tenderness, guarding or rebound.  Musculoskeletal:        General: No swelling. Normal range of motion.     Cervical back: Normal range of motion. No tenderness.     Comments: No reproducible chest wall tenderness  Skin:    General: Skin is warm and dry.  Neurological:     General: No focal  deficit present.     Mental Status: She is alert and oriented to person, place, and time.  Psychiatric:        Mood and Affect: Mood normal.        Behavior: Behavior normal.    ED Results / Procedures / Treatments   Labs (all labs ordered are listed, but only abnormal results are displayed) Labs Reviewed  BASIC METABOLIC PANEL - Abnormal; Notable for the following components:      Result Value   CO2 20 (*)    Creatinine, Ser 1.15 (*)    GFR, Estimated 50 (*)    All other components within normal limits  RESP PANEL BY RT-PCR (FLU A&B, COVID) ARPGX2  CBC  TROPONIN I (HIGH SENSITIVITY)  TROPONIN I (HIGH SENSITIVITY)    EKG EKG Interpretation  Date/Time:  Tuesday March 11 2021 17:20:27 EDT Ventricular Rate:  90 PR Interval:  180 QRS Duration: 76 QT Interval:  360 QTC Calculation: 440 R Axis:   51 Text Interpretation: Artifact Baseline wander Normal sinus rhythm Confirmed by Carmin Muskrat [phone]) on 03/11/2021 10:05:31 PM  Radiology DG Chest Port 1 View  Result Date: 03/11/2021 CLINICAL DATA:  Hemoptysis EXAM: PORTABLE CHEST 1 VIEW COMPARISON:  02/17/2021 FINDINGS: The heart is normal size. Scarring in the lung bases. No  acute confluent opacities. No effusions. Old left rib fracture, unchanged. No acute bone abnormality. IMPRESSION: Bibasilar scarring.  No active disease. Electronically Signed   By: Rolm Baptise M.D.   On: 03/11/2021 17:36    Procedures Procedures   Medications Ordered in ED Medications - No data to display  ED Course  I have reviewed the triage vital signs and the nursing notes.  Pertinent labs & imaging results that were available during my care of the patient were reviewed by me and considered in my medical decision making (see chart for details).    MDM Rules/Calculators/A&P                          75 year old female presents to the ER with complaints of hemoptysis.  On arrival, she is chronically ill-appearing, however nontoxic appearing,  no acute distress, resting comfortably in the ER bed.  She is speaking in full sentences without increased work of breathing.  She is on her baseline 2 L [place].  Not tachycardic, or febrile.  Normotensive.  Physical exam is unremarkable, lung sounds clear, abdomen is soft and nontender, chest without reproducible pain.  Labs ordered in triage, BMP with a creatinine of 1.15 which appears to be at baseline.  Initial troponin of 5.  CBC pending.  Reviewed her prior visits, patient does not appear to have any recent CT imaging of her chest.  Plan for CTA to evaluate for PE.  Care signed out to [facility] who will oversee the rest of her care and dispo accordingly. If her CT is unremarkable or shows no acute processes, suspect she is stable to go home w/ followup with pulmonary.   Case discussed w/ Dr. Vanita Panda who is agreeable to the above plan  Final Clinical Impression(s) / ED Diagnoses Final diagnoses:  None    Rx / DC Orders ED Discharge Orders     None            Lyndel Safe 03/11/21 2307    Carmin Muskrat, MD 03/12/21 1747

## 2021-03-11 NOTE — ED Provider Notes (Signed)
Emergency Medicine Provider Triage Evaluation Note  [name] , a 75 y.o. female  was evaluated in triage.  Pt complains of hemoptysis that has been ongoing for 3 weeks. Pt c/o chest pain and sob. She is currently anticoagulated. On chronic 2L O2 however EMS increased her to 3L as her sats were at 90%.  Review of Systems  Positive: Chest pain, sob, hemotysis Negative: fever  Physical Exam  BP 108/70 (BP Location: Right Arm)   Pulse 81   Temp 98.5 F (36.9 C) (Oral)   Resp 16   SpO2 97%  Gen:   Awake, no distress   Resp:  Normal effort  MSK:   Moves extremities without difficulty   Medical Decision Making  Medically screening exam initiated at 5:01 PM.  Appropriate orders placed.  [name] was informed that the remainder of the evaluation will be completed by another provider, this initial triage assessment does not replace that evaluation, and the importance of remaining in the ED until their evaluation is complete.     [name], [name] S, PA-C 05/15/34 6701    Michelle Cure, DO 41/03/01 2357

## 2021-03-11 NOTE — ED Notes (Signed)
Patient transported to CT 

## 2021-03-12 ENCOUNTER — Emergency Department (HOSPITAL_COMMUNITY): Payer: Medicare HMO

## 2021-03-12 DIAGNOSIS — R042 Hemoptysis: Secondary | ICD-10-CM | POA: Diagnosis not present

## 2021-03-12 LAB — RESP PANEL BY RT-PCR (FLU A&B, COVID) ARPGX2
Influenza A by PCR: NEGATIVE
Influenza B by PCR: NEGATIVE
SARS Coronavirus 2 by RT PCR: NEGATIVE

## 2021-03-12 LAB — TROPONIN I (HIGH SENSITIVITY): Troponin I (High Sensitivity): 3 ng/L (ref <18)

## 2021-03-12 MED ORDER — DOXYCYCLINE HYCLATE 100 MG PO CAPS
100.0000 mg | ORAL_CAPSULE | Freq: Two times a day (BID) | ORAL | 0 refills | Status: DC
Start: 2021-03-12 — End: 2021-08-01

## 2021-03-12 MED ORDER — IOHEXOL 350 MG/ML SOLN
65.0000 mL | Freq: Once | INTRAVENOUS | Status: AC | PRN
  Administered 2021-03-12: 65 mL via INTRAVENOUS

## 2021-03-12 NOTE — Discharge Instructions (Addendum)
CT shows findings of lung mass again, questionable pneumonia.  Also has new lung nodules and asymmetric thryoid--- will need routine monitoring. We are starting doxycycline for treatment of possible pneumonia seen on CT. Follow-up with PCP. Return here for new concerns.

## 2021-03-12 NOTE — ED Provider Notes (Signed)
Assumed care from [name] at shift change.  See prior notes for full H&P.  Briefly, 75 y.o. F with known metastatic lung cancer (not receiving treatment), here with hemoptysis.  It appears she was recently on steroids and abx for same with improvement but now worsening again.  She is anticoagulated with eliquis chronically.  Plan:  labs and CTA pending.  If no acute findings and remains on baseline O2, can likely discharge back to facility.    Results for orders placed or performed during the hospital encounter of 03/11/21  Resp Panel by RT-PCR (Flu A&B, Covid) Nasopharyngeal Swab   Specimen: Nasopharyngeal Swab; Nasopharyngeal(NP) swabs in vial transport medium  Result Value Ref Range   SARS Coronavirus 2 by RT PCR NEGATIVE NEGATIVE   Influenza A by PCR NEGATIVE NEGATIVE   Influenza B by PCR NEGATIVE NEGATIVE  Basic metabolic panel  Result Value Ref Range   Sodium 140 135 - 145 mmol/L   Potassium 5.0 3.5 - 5.1 mmol/L   Chloride 105 98 - 111 mmol/L   CO2 20 (L) 22 - 32 mmol/L   Glucose, Bld 92 70 - 99 mg/dL   BUN 19 8 - 23 mg/dL   Creatinine, Ser 1.15 (H) 0.44 - 1.00 mg/dL   Calcium 9.4 8.9 - 10.3 mg/dL   GFR, Estimated 50 (L) >60 mL/min   Anion gap 15 5 - 15  CBC with Differential  Result Value Ref Range   WBC 6.8 4.0 - 10.5 K/uL   RBC 4.08 3.87 - 5.11 MIL/uL   Hemoglobin 10.9 (L) 12.0 - 15.0 g/dL   HCT 37.1 36.0 - 46.0 %   MCV 90.9 80.0 - 100.0 fL   MCH 26.7 26.0 - 34.0 pg   MCHC 29.4 (L) 30.0 - 36.0 g/dL   RDW 16.1 (H) 11.5 - 15.5 %   Platelets 171 150 - 400 K/uL   nRBC 0.0 0.0 - 0.2 %   Neutrophils Relative % 55 %   Neutro Abs 3.8 1.7 - 7.7 K/uL   Lymphocytes Relative 33 %   Lymphs Abs 2.2 0.7 - 4.0 K/uL   Monocytes Relative 7 %   Monocytes Absolute 0.4 0.1 - 1.0 K/uL   Eosinophils Relative 5 %   Eosinophils Absolute 0.3 0.0 - 0.5 K/uL   Basophils Relative 0 %   Basophils Absolute 0.0 0.0 - 0.1 K/uL   Immature Granulocytes 0 %   Abs Immature Granulocytes 0.03 0.00  - 0.07 K/uL  Troponin I (High Sensitivity)  Result Value Ref Range   Troponin I (High Sensitivity) 5 <18 ng/L  Troponin I (High Sensitivity)  Result Value Ref Range   Troponin I (High Sensitivity) 3 <18 ng/L    CT Angio Chest PE W and/or Wo Contrast  Result Date: 03/12/2021 CLINICAL DATA:  Hemoptysis. EXAM: CT ANGIOGRAPHY CHEST WITH CONTRAST TECHNIQUE: Multidetector CT imaging of the chest was performed using the standard protocol during bolus administration of intravenous contrast. Multiplanar CT image reconstructions and MIPs were obtained to evaluate the vascular anatomy. CONTRAST:  72mL OMNIPAQUE IOHEXOL 350 MG/ML SOLN COMPARISON:  January 02, 2020 FINDINGS: Cardiovascular: There is moderate severity calcification of the aortic arch. The ascending thoracic aorta measures approximately 4.0 cm in diameter. The main pulmonary artery measures approximately 3.8 cm. Satisfactory opacification of the pulmonary arteries to the segmental level. No evidence of pulmonary embolism. Normal heart size. No pericardial effusion. Mediastinum/Nodes: There is mild pretracheal and AP window lymphadenopathy. The left lobe of the thyroid gland is asymmetrically  enlarged and lobulated in appearance. The trachea and esophagus demonstrate no significant findings. Lungs/Pleura: There is moderate to marked severity emphysematous lung disease. A 3.6 cm x 3.2 cm lung mass is seen within the superior segment of the left lower lobe. This extends to the posterior aspect of the left hilum and represents a new finding when compared to the prior study. A mild-to-moderate amount of atelectasis and/or infiltrate is seen extending along the posterior aspect of the left lower lobe. Mild scarring and/or atelectasis is also seen along the posterior aspect of the left upper lobe and posterior right lung base. A 6 mm noncalcified lung nodule is seen within the posterolateral aspect of the left upper lobe (axial CT image 31, CT series 7). This also  represents a new finding when compared to the prior exam. A new 9 mm noncalcified lung nodule is seen within the anteromedial aspect of the right lower lobe (axial CT image 89, CT series 7). Upper Abdomen: No acute abnormality. Musculoskeletal: Degenerative changes are seen throughout the thoracic spine. Review of the MIP images confirms the above findings. IMPRESSION: 1. 3.6 cm x 3.2 cm left lower lobe lung mass, consistent with primary lung malignancy. 2. New bilateral noncalcified lung nodules, concerning for metastatic disease. 3. Mild-to-moderate amount of left lower lobe atelectasis and/or infiltrate. 4. Mild pretracheal and AP window lymphadenopathy. 5. Asymmetrically enlarged and lobulated left lobe of the thyroid gland. Further evaluation with thyroid ultrasound is recommended. This follows ACR consensus guidelines: Managing Incidental Thyroid Nodules Detected on Imaging: White Paper of the ACR Incidental Thyroid Findings Committee. J Am Coll Radiol 2015; 12:143-150. Aortic Atherosclerosis (ICD10-I70.0) and Emphysema (ICD10-J43.9). Electronically Signed   By: Virgina Norfolk M.D.   On: 03/12/2021 00:46   DG Chest Port 1 View  Result Date: 03/11/2021 CLINICAL DATA:  Hemoptysis EXAM: PORTABLE CHEST 1 VIEW COMPARISON:  02/17/2021 FINDINGS: The heart is normal size. Scarring in the lung bases. No acute confluent opacities. No effusions. Old left rib fracture, unchanged. No acute bone abnormality. IMPRESSION: Bibasilar scarring.  No active disease. Electronically Signed   By: Rolm Baptise M.D.   On: 03/11/2021 17:36    Labs overall reassuring.  Hemoglobin has improved from prior, normal platelets, normal WBC count.  CTA with findings of her known lung cancer, mild-to-moderate left lower lobe atelectasis or infiltrate.  There are also some new lung nodules  concerning for metastatic disease along with enlarged thyroid recommended for [name].  Again, patient not a candidate for treatment given her overall  poor health.  On re-check, patient remains on her baseline 2L O2.  She is no acute respiratory distress.  She is eating graham crackers and drinking gingerale.  She has no complaints other than her face itching which is chronic.  At this point, no clear indication for admission. She will continue her anticoagulation, will start course of antibiotics given questionable infiltrate.  Facility staff will be made aware of CT findings and facilitate follow-up with PCP regarding incidental findings.  Can follow-up with heme/onc as scheduled.  Return here for new concerns.  Discussed with Dr. Betsey Holiday-- agrees with plan of care.   Larene Pickett, PA-C 03/12/21 0329    Orpah Greek, MD 03/12/21 [phone]

## 2021-03-12 NOTE — ED Notes (Signed)
[facility] was called to verify pt is still on their list d/t long wait.

## 2021-03-12 NOTE — ED Notes (Signed)
[facility] called re pickup status: next in line.

## 2021-03-12 NOTE — ED Notes (Signed)
Report given to [facility].

## 2021-03-13 ENCOUNTER — Ambulatory Visit (HOSPITAL_COMMUNITY): Admission: RE | Admit: 2021-03-13 | Payer: Medicare HMO | Source: Ambulatory Visit

## 2021-03-14 ENCOUNTER — Other Ambulatory Visit: Payer: Self-pay

## 2021-03-20 ENCOUNTER — Ambulatory Visit: Payer: Medicare Other | Admitting: Internal Medicine

## 2021-03-25 ENCOUNTER — Other Ambulatory Visit: Payer: Self-pay

## 2021-03-25 ENCOUNTER — Ambulatory Visit (HOSPITAL_COMMUNITY)
Admission: RE | Admit: 2021-03-25 | Discharge: 2021-03-25 | Disposition: A | Discharge status: Home / Self Care | Payer: Medicare HMO | Source: Ambulatory Visit | Attending: Internal Medicine | Admitting: Internal Medicine

## 2021-03-25 DIAGNOSIS — C7931 Secondary malignant neoplasm of brain: Secondary | ICD-10-CM | POA: Diagnosis present

## 2021-03-25 MED ORDER — GADOBUTROL 1 MMOL/ML IV SOLN
10.0000 mL | Freq: Once | INTRAVENOUS | Status: AC | PRN
  Administered 2021-03-25: 10 mL via INTRAVENOUS

## 2021-05-15 ENCOUNTER — Telehealth: Payer: Self-pay

## 2021-05-15 NOTE — Telephone Encounter (Signed)
Patient last seen 06/2019. Left voicemail for discharge planner at [facility] requesting callback to schedule overdue appointment.    Beryle Flock, RN

## 2021-05-16 NOTE — Telephone Encounter (Signed)
[facility] returning call and patient scheduled with Dr. Tommy Medal for 12/9.  Beryle Flock, RN

## 2021-05-20 ENCOUNTER — Emergency Department (HOSPITAL_COMMUNITY): Payer: Medicare HMO

## 2021-05-20 ENCOUNTER — Encounter (HOSPITAL_COMMUNITY): Payer: Self-pay | Admitting: Emergency Medicine

## 2021-05-20 ENCOUNTER — Emergency Department (HOSPITAL_COMMUNITY)
Admission: EM | Admit: 2021-05-20 | Discharge: 2021-05-20 | Disposition: A | Discharge status: Home / Self Care | Payer: Medicare HMO | Attending: Emergency Medicine | Admitting: Emergency Medicine

## 2021-05-20 DIAGNOSIS — N183 Chronic kidney disease, stage 3 unspecified: Secondary | ICD-10-CM | POA: Insufficient documentation

## 2021-05-20 DIAGNOSIS — J441 Chronic obstructive pulmonary disease with (acute) exacerbation: Secondary | ICD-10-CM | POA: Diagnosis not present

## 2021-05-20 DIAGNOSIS — Z7901 Long term (current) use of anticoagulants: Secondary | ICD-10-CM | POA: Insufficient documentation

## 2021-05-20 DIAGNOSIS — J45909 Unspecified asthma, uncomplicated: Secondary | ICD-10-CM | POA: Diagnosis not present

## 2021-05-20 DIAGNOSIS — R109 Unspecified abdominal pain: Secondary | ICD-10-CM | POA: Diagnosis present

## 2021-05-20 DIAGNOSIS — Z21 Asymptomatic human immunodeficiency virus [HIV] infection status: Secondary | ICD-10-CM | POA: Insufficient documentation

## 2021-05-20 DIAGNOSIS — R1084 Generalized abdominal pain: Secondary | ICD-10-CM | POA: Diagnosis not present

## 2021-05-20 DIAGNOSIS — Z85118 Personal history of other malignant neoplasm of bronchus and lung: Secondary | ICD-10-CM | POA: Diagnosis not present

## 2021-05-20 DIAGNOSIS — I129 Hypertensive chronic kidney disease with stage 1 through stage 4 chronic kidney disease, or unspecified chronic kidney disease: Secondary | ICD-10-CM | POA: Insufficient documentation

## 2021-05-20 DIAGNOSIS — K5900 Constipation, unspecified: Secondary | ICD-10-CM | POA: Diagnosis not present

## 2021-05-20 DIAGNOSIS — Z7951 Long term (current) use of inhaled steroids: Secondary | ICD-10-CM | POA: Diagnosis not present

## 2021-05-20 LAB — URINALYSIS, ROUTINE W REFLEX MICROSCOPIC
Bilirubin Urine: NEGATIVE
Glucose, UA: NEGATIVE mg/dL
Hgb urine dipstick: NEGATIVE
Ketones, ur: NEGATIVE mg/dL
Leukocytes,Ua: NEGATIVE
Nitrite: NEGATIVE
Protein, ur: NEGATIVE mg/dL
Specific Gravity, Urine: 1.012 (ref 1.005–1.030)
pH: 5 (ref 5.0–8.0)

## 2021-05-20 LAB — COMPREHENSIVE METABOLIC PANEL
ALT: 10 U/L (ref 0–44)
AST: 19 U/L (ref 15–41)
Albumin: 4 g/dL (ref 3.5–5.0)
Alkaline Phosphatase: 59 U/L (ref 38–126)
Anion gap: 7 (ref 5–15)
BUN: 23 mg/dL (ref 8–23)
CO2: 30 mmol/L (ref 22–32)
Calcium: 9.1 mg/dL (ref 8.9–10.3)
Chloride: 102 mmol/L (ref 98–111)
Creatinine, Ser: 1.26 mg/dL — ABNORMAL HIGH (ref 0.44–1.00)
GFR, Estimated: 45 mL/min — ABNORMAL LOW (ref >60)
Glucose, Bld: 112 mg/dL — ABNORMAL HIGH (ref 70–99)
Potassium: 4.3 mmol/L (ref 3.5–5.1)
Sodium: 139 mmol/L (ref 135–145)
Total Bilirubin: 0.4 mg/dL (ref 0.3–1.2)
Total Protein: 8.8 g/dL — ABNORMAL HIGH (ref 6.5–8.1)

## 2021-05-20 LAB — CBC WITH DIFFERENTIAL/PLATELET
Abs Immature Granulocytes: 0.01 10*3/uL (ref 0.00–0.07)
Basophils Absolute: 0 10*3/uL (ref 0.0–0.1)
Basophils Relative: 0 %
Eosinophils Absolute: 0.1 10*3/uL (ref 0.0–0.5)
Eosinophils Relative: 2 %
HCT: 33.2 % — ABNORMAL LOW (ref 36.0–46.0)
Hemoglobin: 9.8 g/dL — ABNORMAL LOW (ref 12.0–15.0)
Immature Granulocytes: 0 %
Lymphocytes Relative: 33 %
Lymphs Abs: 2.2 10*3/uL (ref 0.7–4.0)
MCH: 26.8 pg (ref 26.0–34.0)
MCHC: 29.5 g/dL — ABNORMAL LOW (ref 30.0–36.0)
MCV: 91 fL (ref 80.0–100.0)
Monocytes Absolute: 0.5 10*3/uL (ref 0.1–1.0)
Monocytes Relative: 7 %
Neutro Abs: 3.9 10*3/uL (ref 1.7–7.7)
Neutrophils Relative %: 58 %
Platelets: 234 10*3/uL (ref 150–400)
RBC: 3.65 MIL/uL — ABNORMAL LOW (ref 3.87–5.11)
RDW: 15.7 % — ABNORMAL HIGH (ref 11.5–15.5)
WBC: 6.8 10*3/uL (ref 4.0–10.5)
nRBC: 0 % (ref 0.0–0.2)

## 2021-05-20 LAB — LIPASE, BLOOD: Lipase: 27 U/L (ref 11–51)

## 2021-05-20 MED ORDER — BUSPIRONE HCL 5 MG PO TABS
2.5000 mg | ORAL_TABLET | Freq: Once | ORAL | Status: DC
  Filled 2021-05-20 (×2): qty 0.5

## 2021-05-20 MED ORDER — POLYETHYLENE GLYCOL 3350 17 G PO PACK: 17.0000 g | PACK | Freq: Two times a day (BID) | ORAL | 2 refills | Status: DC

## 2021-05-20 MED ORDER — POLYETHYLENE GLYCOL 3350 17 G PO PACK: 17.0000 g | PACK | Freq: Every day | ORAL | Status: DC

## 2021-05-20 MED ORDER — APIXABAN 5 MG PO TABS
5.0000 mg | ORAL_TABLET | Freq: Once | ORAL | Status: DC
  Filled 2021-05-20 (×2): qty 1

## 2021-05-20 MED ORDER — IOHEXOL 350 MG/ML SOLN: 80.0000 mL | Freq: Once | INTRAVENOUS | Status: DC | PRN

## 2021-05-20 MED ORDER — GABAPENTIN 400 MG PO CAPS: 800.0000 mg | ORAL_CAPSULE | Freq: Once | ORAL | Status: DC

## 2021-05-20 MED ORDER — SODIUM CHLORIDE 0.9 % IV BOLUS
1000.0000 mL | Freq: Once | INTRAVENOUS | Status: AC
  Administered 2021-05-20: 1000 mL via INTRAVENOUS

## 2021-05-20 NOTE — ED Notes (Signed)
Attempted report to [facility].

## 2021-05-20 NOTE — ED Notes (Signed)
Made a 4th attempt at giving report. Secretary answered and I was transferred to another unit. No one answered.

## 2021-05-20 NOTE — ED Provider Notes (Signed)
[facility] DEPT Provider Note   CSN: [id number] Arrival date & time: 05/20/21  1329     History Chief Complaint  Patient presents with   Abdominal Pain    [name] is a 75 y.o. female.  Pt presents to the ED today with abdominal pain.  Pt said her abd pain started yesterday.  Pt denies any sob or fevers.  No n/v/d.      Past Medical History:  Diagnosis Date   Asthma    Atypical squamous cells of undetermined significance (ASCUS) on Papanicolaou smear of cervix    Bed bug bite 07/12/2019   Cancer ([place])    COPD (chronic obstructive pulmonary disease) (HCC)    H/O drainage of abscess    left axilla, from left breast   Hep B w/o coma    HIV (human immunodeficiency virus infection) ([place])    Hyperlipidemia    Lung cancer ([place]) 12/14/13   LUL Adenocarcinoma   Neuromuscular disorder (HCC)    fingers/feet neuropathy   Non-small cell lung cancer ([place])    Renal disorder    S/P radiation therapy 01/26/14-02/02/14   sbrt  lt upper lung-54Gy/39fx   Stroke [facility])     Patient Active Problem List   Diagnosis Date Noted   Acute encephalopathy 02/18/2021   Acute cystitis without hematuria    Acute metabolic encephalopathy 73/53/2992   Chronic respiratory failure with hypoxia ([place]) 01/07/2021   AKI (acute kidney injury) ([place]) 01/03/2021   Brain metastasis ([place]) 05/22/2020   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   HIV (human immunodeficiency virus infection) ([place]) 01/02/2020   Brain mass 01/02/2020   OSA (obstructive sleep apnea) 01/02/2020   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   Cerebral edema (HCC)    Encephalopathy    HIV disease (HCC)    OSA (obstructive sleep apnea)    Dyslipidemia    History of CVA with residual deficit    Brain tumor ([place]) 11/28/2019   Change in mental status 11/21/2019   Bed bug bite 07/12/2019   DNR (do not resuscitate)    Altered mental status    Acute pulmonary embolism without acute cor pulmonale (HCC)    Acute  ischemic right MCA stroke ([place])    Palliative care encounter    Aortic embolism or thrombosis (HCC)    CVA (cerebral vascular accident) ([place]) 03/02/2019   COPD (chronic obstructive pulmonary disease) ([place]) 03/01/2019   PE (pulmonary thromboembolism) ([place]) 03/01/2019   Tobacco abuse 03/01/2019   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   CKD (chronic kidney disease), stage III ([place]) 03/01/2019   Generalized weakness 03/01/2019   Fall at home    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[facility])    Splenic infarct    Acute and chronic respiratory failure (acute-on-chronic) ([place]) 02/11/2019   CKD (chronic kidney disease) stage 3, GFR 30-59 ml/min ([place]) 08/12/2016   Lung nodule 10/12/2015   Bronchitis 09/09/2015   History of lung cancer 09/08/2015   Thoracic aorta atherosclerosis ([place]) 09/08/2015   Chronic obstructive pulmonary disease with (acute) exacerbation ([place]) 09/08/2015   Sepsis ([place]) 03/28/2015   CAP (community acquired pneumonia) 03/28/2015   Lower urinary tract infectious disease 03/28/2015   Respiratory failure ([place]) 03/28/2015   Non-small cell lung cancer ([place])    Malignant neoplasm of upper lobe, bronchus or lung 01/03/2014   Acute hypoxemic respiratory failure ([place]) 10/31/2013   COPD with acute exacerbation ([place]) 10/30/2013   COPD exacerbation ([place]) 10/30/2013   Other and unspecified noninfectious gastroenteritis and colitis(558.9) 11/20/2012   Rectal bleed 11/19/2012  Environmental allergies 11/02/2012   Obesity 05/05/2012   Renal insufficiency 05/05/2012   Hyperglycemia 05/05/2012   Asthma 12/10/2011   Cigarette smoker 12/10/2011   Hypercholesteremia 12/11/2010   Hypertension 12/11/2010   ALLERGIC RHINITIS, SEASONAL 07/03/2010   COPD 07/03/2010   HIV (human immunodeficiency virus infection) ([place]) 12/09/2006   DEPRESSION 12/09/2006   BREAST MASS, BENIGN 12/09/2006   OSTEOPOROSIS 12/09/2006    Past Surgical History:  Procedure Laterality Date   ABCESS DRAINAGE     abcess under Left arm, abcess  from L breast   APPLICATION OF CRANIAL NAVIGATION Right 11/28/2019   Procedure: APPLICATION OF CRANIAL NAVIGATION;  Surgeon: [name], MD;  Location: [facility];  Service: Neurosurgery;  Laterality: Right;   BRAIN SURGERY     BUBBLE STUDY  03/08/2019   Procedure: BUBBLE STUDY;  Surgeon: [name], MD;  Location: [facility];  Service: Cardiovascular;;   CRANIOTOMY Right 11/28/2019   Procedure: Right Craniotomy for Tumor Resection;  Surgeon: [name], MD;  Location: [facility];  Service: Neurosurgery;  Laterality: Right;   CRANIOTOMY     removal of abnormal cells on uterus     TEE WITHOUT CARDIOVERSION N/A 03/08/2019   Procedure: TRANSESOPHAGEAL ECHOCARDIOGRAM (TEE);  Surgeon: [name], MD;  Location: [facility] ENDOSCOPY;  Service: Cardiovascular;  Laterality: N/A;   thyroid gland removed       OB History   No obstetric history on file.     Family History  Problem Relation Age of Onset   Pneumonia Mother    Hypertension Sister    Diabetes Sister    Cancer Sister        pancreatic and lung   Cancer Brother        pancreatic   Cancer Brother        pancreatic    Social History   Tobacco Use   Smoking status: Never   Smokeless tobacco: Never   Tobacco comments:    quit date the day she starts her radiation at the [facility]  Substance Use Topics   Alcohol use: Not Currently    Comment: none in 20 years   Drug use: Not Currently    Comment: none in 20 years    Home Medications Prior to Admission medications   Medication Sig Start Date End Date Taking? Authorizing Provider  albuterol (PROVENTIL) (2.5 MG/3ML) 0.083% nebulizer solution Take 2.5 mg by nebulization every 4 (four) hours as needed for wheezing.    [provider]  albuterol (VENTOLIN HFA) 108 (90 Base) MCG/ACT inhaler Inhale 1 puff into the lungs every 6 (six) hours as needed for shortness of breath.  12/23/19   [provider]  Amino Acids-Protein Hydrolys (FEEDING SUPPLEMENT,  PRO-STAT SUGAR FREE 64,) LIQD Take 30 mLs by mouth daily.    [provider]  ammonium lactate (LAC-HYDRIN) 12 % lotion Apply 1 application topically in the morning and at bedtime.    [provider]  apixaban (ELIQUIS) 5 MG TABS tablet Take 1 tablet (5 mg total) by mouth 2 (two) times daily. 03/13/19   Hosie Poisson, MD  bictegravir-emtricitabine-tenofovir AF (BIKTARVY) 50-200-25 MG TABS tablet Take 1 tablet by mouth daily. 07/12/19   Truman Hayward, MD  busPIRone (BUSPAR) 5 MG tablet Take 2.5 mg by mouth 3 (three) times daily.    [provider]  diclofenac Sodium (VOLTAREN) 1 % GEL Apply 2 g topically every 4 (four) hours as needed for pain. 11/09/19   [provider]  doxycycline (VIBRAMYCIN) 100 MG capsule Take 1 capsule (100 mg total) by mouth 2 (two) times daily. 03/12/21   Larene Pickett, PA-C  Fluticasone-Salmeterol (ADVAIR) 500-50 MCG/DOSE AEPB Inhale 1 puff into the lungs every 12 (twelve) hours. 09/04/19   [provider]  gabapentin (NEURONTIN) 800 MG tablet Take 800 mg by mouth 3 (three) times daily. 10/26/19   [provider]  ibuprofen (ADVIL) 800 MG tablet Take 800 mg by mouth every 6 (six) hours as needed (pain).    [provider]  Melatonin 3 MG CAPS Take 3 mg by mouth at bedtime.    [provider]  montelukast (SINGULAIR) 10 MG tablet Take 10 mg by mouth at bedtime.  09/04/19   [provider]  oxycodone (OXY-IR) 5 MG capsule Take 5 mg by mouth 3 (three) times daily.    [provider]  OXYGEN Inhale 3 L into the lungs continuous.    [provider]  polyethylene glycol (MIRALAX / GLYCOLAX) 17 g packet Take 17 g by mouth 2 (two) times daily. Mix in 4-8 oz liquid and drink    [provider]  QUEtiapine Fumarate (SEROQUEL XR) 150 MG 24 hr tablet Take 150 mg by mouth at bedtime. 11/09/19   [provider]  rOPINIRole (REQUIP) 1 MG tablet Take 1 tablet (1 mg total) by  mouth at bedtime. 05/20/19   Ward, Delice Bison, DO  sennosides-docusate sodium (SENOKOT-S) 8.6-50 MG tablet Take 1 tablet by mouth at bedtime.    [provider]  simethicone (MYLICON) 149 MG chewable tablet Chew 125 mg by mouth every 6 (six) hours as needed (bloating/gas).    [provider]  SPIRIVA RESPIMAT 2.5 MCG/ACT AERS INHALE 2 PUFFS INTO THE LUNGS DAILY Patient taking differently: Inhale 2 puffs into the lungs every evening. 09/24/14   Rigoberto Noel, MD  tiZANidine (ZANAFLEX) 4 MG tablet Take 4 mg by mouth every 12 (twelve) hours as needed (pain). 11/08/19   [provider]  traMADol (ULTRAM) 50 MG tablet Take 1 tablet (50 mg total) by mouth in the morning, at noon, and at bedtime. Patient taking differently: Take 50 mg by mouth every 8 (eight) hours as needed (shoulder pain). 01/09/21   Darliss Cheney, MD  venlafaxine XR (EFFEXOR-XR) 150 MG 24 hr capsule Take 150 mg by mouth every morning.    [provider]  vitamin C (ASCORBIC ACID) 500 MG tablet Take 500 mg by mouth every morning.    [provider]    Allergies    Patient has no known allergies.  Review of Systems   Review of Systems  Gastrointestinal:  Positive for abdominal pain.  All other systems reviewed and are negative.  Physical Exam Updated Vital Signs BP 112/61   Pulse 73   Temp 98 F (36.7 C) (Oral)   Resp 18   SpO2 96%   Physical Exam Vitals and nursing note reviewed.  Constitutional:      Appearance: She is well-developed.  HENT:     Head: Normocephalic and atraumatic.     Mouth/Throat:     Mouth: Mucous membranes are moist.  Eyes:     Extraocular Movements: Extraocular movements intact.     Pupils: Pupils are equal, round, and reactive to light.  Cardiovascular:     Rate and Rhythm: Normal rate and regular rhythm.  Abdominal:     General: Abdomen is flat. Bowel sounds are normal.     Palpations: Abdomen is soft.  Tenderness: There is generalized  abdominal tenderness.  Skin:    General: Skin is warm.     Capillary Refill: Capillary refill takes less than 2 seconds.  Neurological:     Mental Status: She is alert and oriented to person, place, and time.     Comments: Left sided hemiparesis    ED Results / Procedures / Treatments   Labs (all labs ordered are listed, but only abnormal results are displayed) Labs Reviewed  CBC WITH DIFFERENTIAL/PLATELET - Abnormal; Notable for the following components:      Result Value   RBC 3.65 (*)    Hemoglobin 9.8 (*)    HCT 33.2 (*)    MCHC 29.5 (*)    RDW 15.7 (*)    All other components within normal limits  COMPREHENSIVE METABOLIC PANEL - Abnormal; Notable for the following components:   Glucose, Bld 112 (*)    Creatinine, Ser 1.26 (*)    Total Protein 8.8 (*)    GFR, Estimated 45 (*)    All other components within normal limits  LIPASE, BLOOD  URINALYSIS, ROUTINE W REFLEX MICROSCOPIC    EKG None  Radiology No results found.  Procedures Procedures   Medications Ordered in ED Medications  sodium chloride 0.9 % bolus 1,000 mL (has no administration in time range)  iohexol (OMNIPAQUE) 350 MG/ML injection 80 mL (80 mLs Intravenous Contrast Given 05/20/21 1617)    ED Course  I have reviewed the triage vital signs and the nursing notes.  Pertinent labs & imaging results that were available during my care of the patient were reviewed by me and considered in my medical decision making (see chart for details).    MDM Rules/Calculators/A&P                           Pt's labs are nl.  UA still pending.  CT abd/pelvis pending.  Pt signed out to Dr. Maryan Rued at shift change.  Final Clinical Impression(s) / ED Diagnoses Final diagnoses:  Generalized abdominal pain    Rx / DC Orders ED Discharge Orders     None        Isla Pence, MD 05/20/21 [phone]

## 2021-05-20 NOTE — ED Triage Notes (Signed)
Ems brings pt in from [facility] for abdominal pain. States the abdominal pain started yesterday. Normal BM yesterday.

## 2021-05-20 NOTE — ED Provider Notes (Signed)
CT shows 8 cm of stool in the rectum however on rectal exam the stool is soft and no evidence of fecal impaction.  Otherwise no acute findings on CT and chest x-ray shows no change in lung opacity which has been known in the past.  On repeat evaluation patient appears well.  She is requesting to eat and drink.  Will discharge back to her facility with MiraLAX.   Blanchie Dessert, MD 05/20/21 [phone]

## 2021-05-20 NOTE — ED Notes (Signed)
Report given to Tamika at [facility]

## 2021-05-20 NOTE — ED Notes (Signed)
Attempted report to [facility] x2.

## 2021-05-20 NOTE — Discharge Instructions (Addendum)
The CAT scan today looks fine, lab work was normal.  It does appear that you are constipated and have a large amount of stool in your colon.  This is most likely what is causing your abdominal pain.  No evidence of urinary tract infection today.  You need to start taking MiraLAX to improve your bowel movements.

## 2021-05-20 NOTE — ED Notes (Signed)
[facility] is here for transport.

## 2021-05-20 NOTE — ED Notes (Signed)
[facility] called for transport.  

## 2021-06-06 ENCOUNTER — Telehealth: Payer: Self-pay | Admitting: Internal Medicine

## 2021-06-06 ENCOUNTER — Encounter: Payer: Self-pay | Admitting: Infectious Disease

## 2021-06-06 ENCOUNTER — Other Ambulatory Visit: Payer: Self-pay

## 2021-06-06 ENCOUNTER — Ambulatory Visit (INDEPENDENT_AMBULATORY_CARE_PROVIDER_SITE_OTHER): Payer: Medicare HMO | Admitting: Infectious Disease

## 2021-06-06 VITALS — BP 112/71 | HR 76 | Temp 98.0°F

## 2021-06-06 DIAGNOSIS — J449 Chronic obstructive pulmonary disease, unspecified: Secondary | ICD-10-CM | POA: Diagnosis not present

## 2021-06-06 DIAGNOSIS — B20 Human immunodeficiency virus [HIV] disease: Secondary | ICD-10-CM | POA: Diagnosis not present

## 2021-06-06 DIAGNOSIS — N1832 Chronic kidney disease, stage 3b: Secondary | ICD-10-CM | POA: Diagnosis not present

## 2021-06-06 DIAGNOSIS — I6930 Unspecified sequelae of cerebral infarction: Secondary | ICD-10-CM

## 2021-06-06 DIAGNOSIS — B89 Unspecified parasitic disease: Secondary | ICD-10-CM | POA: Insufficient documentation

## 2021-06-06 HISTORY — DX: Unspecified parasitic disease: B89

## 2021-06-06 NOTE — Progress Notes (Signed)
Subjective:  Chief complaint: She is complaining about the parasites that she believes have infected her patient ID: [name], female    DOB: 1945-11-03, 75 y.o.   MRN: [id number]  HPI  [name] -year-old African-American lady living with HIV that I previously took care of at our [facility] up to 2015.  The time she been well controlled on Atripla.  She separately moved to [facility] and was under the care of Dr. Wyline Mood and there.  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  She has known comorbid hypertension COPD stage III chronic kidney disease obstructive sleep apnea tobacco abuse and prior adenocarcinoma of the left upper lung.  Last saw her in 2021.  That time she was complaining of bedbugs and but been treated for multiple times with permethrin by the ER.  I ended up giving her steroids for this topically.  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  Day she again talked about this parasite that she said to her phone told her is that one of the most dangerous monsters in the [place] and kills thousands of people every year.  She says at times in addition to being in her skin she is coughing and vomiting up these parasites.  Otherwise she is in a good mood was happy to come and see me in the clinic.    Past Medical History:  Diagnosis Date   Asthma    Atypical squamous cells of undetermined significance (ASCUS) on Papanicolaou smear of cervix    Bed bug bite 07/12/2019   Cancer (HCC)    COPD (chronic obstructive pulmonary disease) (HCC)    H/O drainage of abscess    left axilla, from left breast   Hep B w/o coma    HIV (human immunodeficiency virus infection) ([place])    Hyperlipidemia    Lung cancer ([place]) 12/14/13   LUL Adenocarcinoma   Neuromuscular disorder  (HCC)    fingers/feet neuropathy   Non-small cell lung cancer ([place])    Renal disorder    S/P radiation therapy 01/26/14-02/02/14   sbrt  lt upper lung-54Gy/61fx   Stroke [facility])     Past Surgical History:  Procedure Laterality Date   ABCESS DRAINAGE     abcess under Left arm, abcess from L breast   APPLICATION OF CRANIAL NAVIGATION Right 11/28/2019   Procedure: APPLICATION OF CRANIAL NAVIGATION;  Surgeon: [name], MD;  Location: [place];  Service: Neurosurgery;  Laterality: Right;   BRAIN SURGERY     BUBBLE STUDY  03/08/2019   Procedure: BUBBLE STUDY;  Surgeon: [name], MD;  Location: [place];  Service: Cardiovascular;;   CRANIOTOMY Right 11/28/2019   Procedure: Right Craniotomy for Tumor Resection;  Surgeon: [name], MD;  Location: [place];  Service: Neurosurgery;  Laterality: Right;   CRANIOTOMY     removal of abnormal cells on uterus     TEE WITHOUT CARDIOVERSION N/A 03/08/2019   Procedure: TRANSESOPHAGEAL ECHOCARDIOGRAM (TEE);  Surgeon: [name], MD;  Location: [facility] ENDOSCOPY;  Service: Cardiovascular;  Laterality: N/A;   thyroid gland removed      Family History  Problem Relation Age of Onset   Pneumonia Mother    Hypertension Sister    Diabetes Sister    Cancer Sister  pancreatic and lung   Cancer Brother        pancreatic   Cancer Brother        pancreatic      Social History   Socioeconomic History   Marital status: Single    Spouse name: Not on file   Number of children: Not on file   Years of education: Not on file   Highest education level: Not on file  Occupational History   Not on file  Tobacco Use   Smoking status: Never   Smokeless tobacco: Never   Tobacco comments:    quit date the day she starts her radiation at the [facility]  Substance and Sexual Activity   Alcohol use: Not Currently    Comment: none in 20 years   Drug use: Not Currently    Comment: none in 20 years   Sexual activity: Not Currently     Partners: Male    Comment: given condoms  Other Topics Concern   Not on file  Social History Narrative   ** Merged History Encounter **       Social Determinants of Health   Financial Resource Strain: Not on file  Food Insecurity: Not on file  Transportation Needs: Not on file  Physical Activity: Not on file  Stress: Not on file  Social Connections: Not on file    No Known Allergies   Current Outpatient Medications:    albuterol (PROVENTIL) (2.5 MG/3ML) 0.083% nebulizer solution, Take 2.5 mg by nebulization every 4 (four) hours as needed for wheezing., Disp: , Rfl:    albuterol (VENTOLIN HFA) 108 (90 Base) MCG/ACT inhaler, Inhale 1 puff into the lungs every 6 (six) hours as needed for shortness of breath. , Disp: , Rfl:    Amino Acids-Protein Hydrolys (FEEDING SUPPLEMENT, PRO-STAT SUGAR FREE 64,) LIQD, Take 30 mLs by mouth daily., Disp: , Rfl:    ammonium lactate (LAC-HYDRIN) 12 % lotion, Apply 1 application topically in the morning and at bedtime., Disp: , Rfl:    apixaban (ELIQUIS) 5 MG TABS tablet, Take 1 tablet (5 mg total) by mouth 2 (two) times daily., Disp: 60 tablet, Rfl: 1   bictegravir-emtricitabine-tenofovir AF (BIKTARVY) 50-200-25 MG TABS tablet, Take 1 tablet by mouth daily., Disp: 30 tablet, Rfl: 11   busPIRone (BUSPAR) 5 MG tablet, Take 2.5 mg by mouth 3 (three) times daily., Disp: , Rfl:    diclofenac Sodium (VOLTAREN) 1 % GEL, Apply 2 g topically every 4 (four) hours as needed for pain., Disp: , Rfl:    doxycycline (VIBRAMYCIN) 100 MG capsule, Take 1 capsule (100 mg total) by mouth 2 (two) times daily., Disp: 20 capsule, Rfl: 0   Fluticasone-Salmeterol (ADVAIR) 500-50 MCG/DOSE AEPB, Inhale 1 puff into the lungs every 12 (twelve) hours., Disp: , Rfl:    gabapentin (NEURONTIN) 800 MG tablet, Take 800 mg by mouth 3 (three) times daily., Disp: , Rfl:    ibuprofen (ADVIL) 800 MG tablet, Take 800 mg by mouth every 6 (six) hours as needed (pain)., Disp: , Rfl:    Melatonin  3 MG CAPS, Take 3 mg by mouth at bedtime., Disp: , Rfl:    montelukast (SINGULAIR) 10 MG tablet, Take 10 mg by mouth at bedtime. , Disp: , Rfl:    oxycodone (OXY-IR) 5 MG capsule, Take 5 mg by mouth 3 (three) times daily., Disp: , Rfl:    OXYGEN, Inhale 3 L into the lungs continuous., Disp: , Rfl:    polyethylene glycol (MIRALAX /  GLYCOLAX) 17 g packet, Take 17 g by mouth 2 (two) times daily. Mix in 4-8 oz liquid and drink, Disp: 14 each, Rfl: 2   QUEtiapine Fumarate (SEROQUEL XR) 150 MG 24 hr tablet, Take 150 mg by mouth at bedtime., Disp: , Rfl:    rOPINIRole (REQUIP) 1 MG tablet, Take 1 tablet (1 mg total) by mouth at bedtime., Disp: 30 tablet, Rfl: 1   sennosides-docusate sodium (SENOKOT-S) 8.6-50 MG tablet, Take 1 tablet by mouth at bedtime., Disp: , Rfl:    simethicone (MYLICON) 778 MG chewable tablet, Chew 125 mg by mouth every 6 (six) hours as needed (bloating/gas)., Disp: , Rfl:    SPIRIVA RESPIMAT 2.5 MCG/ACT AERS, INHALE 2 PUFFS INTO THE LUNGS DAILY (Patient taking differently: Inhale 2 puffs into the lungs every evening.), Disp: 1 Inhaler, Rfl: 0   tiZANidine (ZANAFLEX) 4 MG tablet, Take 4 mg by mouth every 12 (twelve) hours as needed (pain)., Disp: , Rfl:    traMADol (ULTRAM) 50 MG tablet, Take 1 tablet (50 mg total) by mouth in the morning, at noon, and at bedtime. (Patient taking differently: Take 50 mg by mouth every 8 (eight) hours as needed (shoulder pain).), Disp: 10 tablet, Rfl: 0   venlafaxine XR (EFFEXOR-XR) 150 MG 24 hr capsule, Take 150 mg by mouth every morning., Disp: , Rfl:    vitamin C (ASCORBIC ACID) 500 MG tablet, Take 500 mg by mouth every morning., Disp: , Rfl:    Review of Systems  Unable to perform ROS: Psychiatric disorder      Objective:   Physical Exam Constitutional:      General: She is not in acute distress.    Appearance: Normal appearance. She is well-developed. She is not ill-appearing or diaphoretic.  HENT:     Head: Normocephalic and atraumatic.      Right Ear: Hearing and external ear normal.     Left Ear: Hearing and external ear normal.     Nose: Nose normal. No nasal deformity or rhinorrhea.     Mouth/Throat:     Pharynx: No oropharyngeal exudate.  Eyes:     General: No scleral icterus.    Conjunctiva/sclera: Conjunctivae normal.     Right eye: Right conjunctiva is not injected.     Left eye: Left conjunctiva is not injected.     Pupils: Pupils are equal, round, and reactive to light.  Neck:     Vascular: No JVD.  Cardiovascular:     Rate and Rhythm: Normal rate and regular rhythm.     Heart sounds: Normal heart sounds, S1 normal and S2 normal. No murmur heard.   No friction rub.  Pulmonary:     Effort: Pulmonary effort is normal. No respiratory distress.     Breath sounds: No wheezing.  Abdominal:     General: Bowel sounds are normal. There is no distension.     Palpations: Abdomen is soft.     Tenderness: There is no abdominal tenderness. There is no rebound.  Musculoskeletal:        General: No tenderness. Normal range of motion.     Right shoulder: Normal.     Left shoulder: Normal.     Cervical back: Normal range of motion and neck supple.     Right hip: Normal.     Left hip: Normal.     Right knee: Normal.     Left knee: Normal.  Lymphadenopathy:     Head:     Right side of head: No  submandibular, preauricular or posterior auricular adenopathy.     Left side of head: No submandibular, preauricular or posterior auricular adenopathy.     Cervical: No cervical adenopathy.     Right cervical: No superficial or deep cervical adenopathy.    Left cervical: No superficial or deep cervical adenopathy.  Skin:    General: Skin is warm and dry.     Coloration: Skin is not pale.     Findings: No abrasion, bruising, ecchymosis, erythema, lesion or rash.     Nails: There is no clubbing.  Neurological:     Mental Status: She is alert and oriented to person, place, and time.     Deep Tendon Reflexes: Reflexes normal.   Psychiatric:        Attention and Perception: Attention and perception normal. She is attentive.        Mood and Affect: Mood and affect normal.        Speech: Speech normal.        Behavior: Behavior is cooperative.        Thought Content: Thought content is delusional.        Cognition and Memory: Memory normal.        Assessment & Plan:   HIV disease:   Checking a viral load CD4 count CBC CMP  I am continue her BIKTARVY.  Delusional parasitosis and psychotic disorder she is on several antipsychotic medications.  CVA with hemiplegia: She is currently residing in skilled nursing facility seems to be on the appropriate medications  COPD currently requiring chronic oxygen.  Chronic kidney disease: We will check repeat labs today with repeat CMP

## 2021-06-06 NOTE — Telephone Encounter (Signed)
Scheduled per sch msg. Called and left msg with nursing facility

## 2021-06-09 LAB — COMPLETE METABOLIC PANEL WITH GFR
AG Ratio: 0.9 (calc) — ABNORMAL LOW (ref 1.0–2.5)
ALT: 7 U/L (ref 6–29)
AST: 16 U/L (ref 10–35)
Albumin: 4.1 g/dL (ref 3.6–5.1)
Alkaline phosphatase (APISO): 63 U/L (ref 37–153)
BUN/Creatinine Ratio: 15 (calc) (ref 6–22)
BUN: 17 mg/dL (ref 7–25)
CO2: 28 mmol/L (ref 20–32)
Calcium: 9.2 mg/dL (ref 8.6–10.4)
Chloride: 103 mmol/L (ref 98–110)
Creat: 1.17 mg/dL — ABNORMAL HIGH (ref 0.60–1.00)
Globulin: 4.5 g/dL (calc) — ABNORMAL HIGH (ref 1.9–3.7)
Glucose, Bld: 95 mg/dL (ref 65–99)
Potassium: 4.4 mmol/L (ref 3.5–5.3)
Sodium: 140 mmol/L (ref 135–146)
Total Bilirubin: 0.3 mg/dL (ref 0.2–1.2)
Total Protein: 8.6 g/dL — ABNORMAL HIGH (ref 6.1–8.1)
eGFR: 49 mL/min/{1.73_m2} — ABNORMAL LOW (ref >=60)

## 2021-06-09 LAB — LIPID PANEL
Cholesterol: 174 mg/dL (ref <200)
HDL: 48 mg/dL — ABNORMAL LOW (ref >=50)
LDL Cholesterol (Calc): 105 mg/dL (calc) — ABNORMAL HIGH
Non-HDL Cholesterol (Calc): 126 mg/dL (calc) (ref <130)
Total CHOL/HDL Ratio: 3.6 (calc) (ref <5.0)
Triglycerides: 116 mg/dL (ref <150)

## 2021-06-09 LAB — CBC WITH DIFFERENTIAL/PLATELET
Absolute Monocytes: 395 cells/uL (ref 200–950)
Basophils Absolute: 30 cells/uL (ref 0–200)
Basophils Relative: 0.4 %
Eosinophils Absolute: 137 cells/uL (ref 15–500)
Eosinophils Relative: 1.8 %
HCT: 33.3 % — ABNORMAL LOW (ref 35.0–45.0)
Hemoglobin: 9.9 g/dL — ABNORMAL LOW (ref 11.7–15.5)
Lymphs Abs: 2234 cells/uL (ref 850–3900)
MCH: 25.4 pg — ABNORMAL LOW (ref 27.0–33.0)
MCHC: 29.7 g/dL — ABNORMAL LOW (ref 32.0–36.0)
MCV: 85.6 fL (ref 80.0–100.0)
MPV: 10.6 fL (ref 7.5–12.5)
Monocytes Relative: 5.2 %
Neutro Abs: 4803 cells/uL (ref 1500–7800)
Neutrophils Relative %: 63.2 %
Platelets: 311 10*3/uL (ref 140–400)
RBC: 3.89 10*6/uL (ref 3.80–5.10)
RDW: 14.6 % (ref 11.0–15.0)
Total Lymphocyte: 29.4 %
WBC: 7.6 10*3/uL (ref 3.8–10.8)

## 2021-06-09 LAB — RPR: RPR Ser Ql: NONREACTIVE

## 2021-06-09 LAB — T-HELPER CELLS (CD4) COUNT (NOT AT ARMC)
Absolute CD4: 712 cells/uL (ref 490–1740)
CD4 T Helper %: 32 % (ref 30–61)
Total lymphocyte count: 2202 cells/uL (ref 850–3900)

## 2021-06-09 LAB — HIV-1 RNA QUANT-NO REFLEX-BLD
HIV 1 RNA Quant: <20 Copies/mL — ABNORMAL HIGH
HIV-1 RNA Quant, Log: <1.3 Log cps/mL — ABNORMAL HIGH

## 2021-06-13 ENCOUNTER — Non-Acute Institutional Stay: Payer: Medicaid Other | Admitting: Hospice

## 2021-06-13 ENCOUNTER — Other Ambulatory Visit: Payer: Self-pay

## 2021-06-13 DIAGNOSIS — J449 Chronic obstructive pulmonary disease, unspecified: Secondary | ICD-10-CM

## 2021-06-13 DIAGNOSIS — F339 Major depressive disorder, recurrent, unspecified: Secondary | ICD-10-CM

## 2021-06-13 DIAGNOSIS — R451 Restlessness and agitation: Secondary | ICD-10-CM

## 2021-06-13 DIAGNOSIS — Z515 Encounter for palliative care: Secondary | ICD-10-CM

## 2021-06-13 NOTE — Progress Notes (Signed)
[facility] Consult Note Telephone: [phone]  Fax: [phone]  PATIENT NAME: [name] DOB: 1945/09/30 MRN: [id number]  PRIMARY CARE PROVIDER:   Nolene Ebbs, MD Nolene Ebbs, MD [address] [place],  [place] [address]  REFERRING PROVIDER: Martinique Miller, NP  RESPONSIBLE PARTY: Self Contact Information     Name Relation Home Work Mobile   Bowring,BJ Brother [phone]  [phone]   Mason Brother [phone]     Larwance Sachs [phone]         Visit is to build trust and highlight Palliative Medicine as specialized medical care for people living with serious illness, aimed at facilitating better quality of life through symptoms relief, assisting with advance care planning and complex medical decision making. This is a follow up visit.  RECOMMENDATIONS/PLAN:   Advance Care Planning/Code Status: Patient is a DO NOT RESUSCITATE  Goals of Care: Goals of care include to maximize quality of life and symptom management.  Visit consisted of counseling and education dealing with the complex and emotionally intense issues of symptom management and palliative care in the setting of serious and potentially life-threatening illness. Patient reports she is overall doing well. Palliative care team will continue to support patient, patient's family, and medical team.  Symptom management/Plan:  COPD: Patient continues with breathing treatments - albuterol, Spiriva, Advair.  No exacerbation currently. Oxygen supplementation as needed.  Use fan for air circulation as needed. Discussion on slow deep breathing.  Agitation/delusional behavior: Followed by psych.  Patient continues on quetiapine; also on buspirone;  Major depressive disorder. Managed with venlafaxine. Encouraged de-escalation techniques, redirection. Follow up: Palliative care will continue to follow for complex medical decision making, advance  care planning, and clarification of goals. Return 6 weeks or prn. Encouraged to call provider sooner with any concerns.  CHIEF COMPLAINT: Palliative follow up  HISTORY OF PRESENT ILLNESS:  [name] a 75 y.o. female with multiple medical problems including COPD with no recent exacerbations since last visit; compliant with breathing treatments.  History of HIV, major depressive disorder, lung cancer, stroke.  She denies pain/discomfort. History obtained from review of EMR, discussion with primary team, family and/or patient. Records reviewed and summarized above. All 10 point systems reviewed and are negative except as documented in history of present illness above  Review and summarization of Epic records shows history from other than patient.   Palliative Care was asked to follow this patient o help address complex decision making in the context of advance care planning and goals of care clarification.   PHYSICAL EXAM  General: In no acute distress Cardiovascular: regular rate and rhythm; no edema in BLE Pulmonary: no cough, no increased work of breathing, normal respiratory effort; oxygen supplementation in use Abdomen: soft, non tender, no guarding, positive bowel sounds in all quadrants GU:  no suprapubic tenderness Eyes: Normal lids, no discharge ENMT: Moist mucous membranes Musculoskeletal:  weakness Skin: no rash to visible skin, warm without cyanosis,  Psych: non-anxious affect Neurological: Weakness but otherwise non focal Heme/lymph/immuno: no bruises, no bleeding  PERTINENT MEDICATIONS:  Outpatient Encounter Medications as of 06/13/2021  Medication Sig   albuterol (PROVENTIL) (2.5 MG/3ML) 0.083% nebulizer solution Take 2.5 mg by nebulization every 4 (four) hours as needed for wheezing.   albuterol (VENTOLIN HFA) 108 (90 Base) MCG/ACT inhaler Inhale 1 puff into the lungs every 6 (six) hours as needed for shortness of breath.    Amino Acids-Protein Hydrolys (FEEDING  SUPPLEMENT, PRO-STAT SUGAR FREE 64,) LIQD  Take 30 mLs by mouth daily.   ammonium lactate (LAC-HYDRIN) 12 % lotion Apply 1 application topically in the morning and at bedtime.   apixaban (ELIQUIS) 5 MG TABS tablet Take 1 tablet (5 mg total) by mouth 2 (two) times daily.   bictegravir-emtricitabine-tenofovir AF (BIKTARVY) 50-200-25 MG TABS tablet Take 1 tablet by mouth daily.   busPIRone (BUSPAR) 5 MG tablet Take 2.5 mg by mouth 3 (three) times daily.   diclofenac Sodium (VOLTAREN) 1 % GEL Apply 2 g topically every 4 (four) hours as needed for pain.   doxycycline (VIBRAMYCIN) 100 MG capsule Take 1 capsule (100 mg total) by mouth 2 (two) times daily.   Fluticasone-Salmeterol (ADVAIR) 500-50 MCG/DOSE AEPB Inhale 1 puff into the lungs every 12 (twelve) hours.   gabapentin (NEURONTIN) 800 MG tablet Take 800 mg by mouth 3 (three) times daily.   ibuprofen (ADVIL) 800 MG tablet Take 800 mg by mouth every 6 (six) hours as needed (pain).   Melatonin 3 MG CAPS Take 3 mg by mouth at bedtime.   montelukast (SINGULAIR) 10 MG tablet Take 10 mg by mouth at bedtime.    oxycodone (OXY-IR) 5 MG capsule Take 5 mg by mouth 3 (three) times daily.   OXYGEN Inhale 3 L into the lungs continuous.   polyethylene glycol (MIRALAX / GLYCOLAX) 17 g packet Take 17 g by mouth 2 (two) times daily. Mix in 4-8 oz liquid and drink   QUEtiapine Fumarate (SEROQUEL XR) 150 MG 24 hr tablet Take 150 mg by mouth at bedtime.   rOPINIRole (REQUIP) 1 MG tablet Take 1 tablet (1 mg total) by mouth at bedtime.   sennosides-docusate sodium (SENOKOT-S) 8.6-50 MG tablet Take 1 tablet by mouth at bedtime.   simethicone (MYLICON) 160 MG chewable tablet Chew 125 mg by mouth every 6 (six) hours as needed (bloating/gas).   SPIRIVA RESPIMAT 2.5 MCG/ACT AERS INHALE 2 PUFFS INTO THE LUNGS DAILY (Patient taking differently: Inhale 2 puffs into the lungs every evening.)   tiZANidine (ZANAFLEX) 4 MG tablet Take 4 mg by mouth every 12 (twelve) hours as needed  (pain).   traMADol (ULTRAM) 50 MG tablet Take 1 tablet (50 mg total) by mouth in the morning, at noon, and at bedtime. (Patient taking differently: Take 50 mg by mouth every 8 (eight) hours as needed (shoulder pain).)   venlafaxine XR (EFFEXOR-XR) 150 MG 24 hr capsule Take 150 mg by mouth every morning.   vitamin C (ASCORBIC ACID) 500 MG tablet Take 500 mg by mouth every morning.   No facility-administered encounter medications on file as of 06/13/2021.    HOSPICE ELIGIBILITY/DIAGNOSIS: TBD  PAST MEDICAL HISTORY:  Past Medical History:  Diagnosis Date   Asthma    Atypical squamous cells of undetermined significance (ASCUS) on Papanicolaou smear of cervix    Bed bug bite 07/12/2019   Cancer (HCC)    COPD (chronic obstructive pulmonary disease) (HCC)    H/O drainage of abscess    left axilla, from left breast   Hep B w/o coma    HIV (human immunodeficiency virus infection) ([place])    Hyperlipidemia    Lung cancer ([place]) 12/14/13   LUL Adenocarcinoma   Neuromuscular disorder (HCC)    fingers/feet neuropathy   Non-small cell lung cancer ([place])    Parasite infection 06/06/2021   Renal disorder    S/P radiation therapy 01/26/14-02/02/14   sbrt  lt upper lung-54Gy/33fx   Stroke ([place])      ALLERGIES: No Known Allergies  I spent  35 minutes providing this consultation; this includes time spent with patient/family, chart review and documentation. More than 50% of the time in this consultation was spent on counseling and coordinating communication   Thank you for the opportunity to participate in the care of [name] Please call our office at [phone] if we can be of additional assistance.  Note: Portions of this note were generated with Lobbyist. Dictation errors may occur despite best attempts at proofreading.  Teodoro Spray, NP

## 2021-06-16 ENCOUNTER — Ambulatory Visit: Payer: Medicare HMO | Admitting: Internal Medicine

## 2021-07-30 ENCOUNTER — Observation Stay (HOSPITAL_COMMUNITY): Payer: Medicare HMO

## 2021-07-30 ENCOUNTER — Emergency Department (HOSPITAL_COMMUNITY): Payer: Medicare HMO

## 2021-07-30 ENCOUNTER — Telehealth: Payer: Self-pay | Admitting: Internal Medicine

## 2021-07-30 ENCOUNTER — Other Ambulatory Visit (HOSPITAL_COMMUNITY): Payer: Self-pay

## 2021-07-30 ENCOUNTER — Observation Stay (HOSPITAL_COMMUNITY)
Admission: EM | Admit: 2021-07-30 | Discharge: 2021-08-01 | Disposition: A | Discharge status: Skilled Nursing Facility | Payer: Medicare HMO | Attending: Emergency Medicine | Admitting: Emergency Medicine

## 2021-07-30 ENCOUNTER — Encounter (HOSPITAL_COMMUNITY): Payer: Self-pay | Admitting: Internal Medicine

## 2021-07-30 DIAGNOSIS — Z21 Asymptomatic human immunodeficiency virus [HIV] infection status: Secondary | ICD-10-CM

## 2021-07-30 DIAGNOSIS — I129 Hypertensive chronic kidney disease with stage 1 through stage 4 chronic kidney disease, or unspecified chronic kidney disease: Secondary | ICD-10-CM | POA: Insufficient documentation

## 2021-07-30 DIAGNOSIS — J9611 Chronic respiratory failure with hypoxia: Secondary | ICD-10-CM | POA: Diagnosis present

## 2021-07-30 DIAGNOSIS — I6930 Unspecified sequelae of cerebral infarction: Secondary | ICD-10-CM

## 2021-07-30 DIAGNOSIS — J449 Chronic obstructive pulmonary disease, unspecified: Secondary | ICD-10-CM | POA: Diagnosis not present

## 2021-07-30 DIAGNOSIS — K5900 Constipation, unspecified: Secondary | ICD-10-CM

## 2021-07-30 DIAGNOSIS — C7931 Secondary malignant neoplasm of brain: Secondary | ICD-10-CM | POA: Diagnosis not present

## 2021-07-30 DIAGNOSIS — E6609 Other obesity due to excess calories: Secondary | ICD-10-CM

## 2021-07-30 DIAGNOSIS — N183 Chronic kidney disease, stage 3 unspecified: Secondary | ICD-10-CM | POA: Diagnosis present

## 2021-07-30 DIAGNOSIS — Z20822 Contact with and (suspected) exposure to covid-19: Secondary | ICD-10-CM | POA: Insufficient documentation

## 2021-07-30 DIAGNOSIS — N1831 Chronic kidney disease, stage 3a: Secondary | ICD-10-CM | POA: Insufficient documentation

## 2021-07-30 DIAGNOSIS — E669 Obesity, unspecified: Secondary | ICD-10-CM | POA: Diagnosis present

## 2021-07-30 DIAGNOSIS — N179 Acute kidney failure, unspecified: Secondary | ICD-10-CM

## 2021-07-30 DIAGNOSIS — I10 Essential (primary) hypertension: Secondary | ICD-10-CM

## 2021-07-30 DIAGNOSIS — B20 Human immunodeficiency virus [HIV] disease: Secondary | ICD-10-CM | POA: Diagnosis not present

## 2021-07-30 DIAGNOSIS — Z85118 Personal history of other malignant neoplasm of bronchus and lung: Secondary | ICD-10-CM | POA: Diagnosis not present

## 2021-07-30 DIAGNOSIS — Z86711 Personal history of pulmonary embolism: Secondary | ICD-10-CM

## 2021-07-30 DIAGNOSIS — R042 Hemoptysis: Principal | ICD-10-CM | POA: Insufficient documentation

## 2021-07-30 DIAGNOSIS — Z79899 Other long term (current) drug therapy: Secondary | ICD-10-CM | POA: Insufficient documentation

## 2021-07-30 DIAGNOSIS — Z515 Encounter for palliative care: Secondary | ICD-10-CM

## 2021-07-30 DIAGNOSIS — Z7901 Long term (current) use of anticoagulants: Secondary | ICD-10-CM | POA: Insufficient documentation

## 2021-07-30 DIAGNOSIS — C3492 Malignant neoplasm of unspecified part of left bronchus or lung: Secondary | ICD-10-CM

## 2021-07-30 DIAGNOSIS — F22 Delusional disorders: Secondary | ICD-10-CM

## 2021-07-30 DIAGNOSIS — R109 Unspecified abdominal pain: Secondary | ICD-10-CM

## 2021-07-30 DIAGNOSIS — C3490 Malignant neoplasm of unspecified part of unspecified bronchus or lung: Secondary | ICD-10-CM | POA: Diagnosis present

## 2021-07-30 HISTORY — DX: Infarction of spleen: D73.5

## 2021-07-30 HISTORY — DX: Acute cystitis without hematuria: N30.00

## 2021-07-30 LAB — URINALYSIS, COMPLETE (UACMP) WITH MICROSCOPIC
Bilirubin Urine: NEGATIVE
Glucose, UA: NEGATIVE mg/dL
Ketones, ur: NEGATIVE mg/dL
Nitrite: POSITIVE — AB
Protein, ur: NEGATIVE mg/dL
Specific Gravity, Urine: 1.02 (ref 1.005–1.030)
Squamous Epithelial / HPF: NONE SEEN (ref 0–5)
WBC, UA: >50 WBC/hpf (ref 0–5)
pH: 6 (ref 5.0–8.0)

## 2021-07-30 LAB — CBC WITH DIFFERENTIAL/PLATELET
Abs Immature Granulocytes: 0.02 10*3/uL (ref 0.00–0.07)
Basophils Absolute: 0 10*3/uL (ref 0.0–0.1)
Basophils Relative: 0 %
Eosinophils Absolute: 0.1 10*3/uL (ref 0.0–0.5)
Eosinophils Relative: 2 %
HCT: 32.4 % — ABNORMAL LOW (ref 36.0–46.0)
Hemoglobin: 9.2 g/dL — ABNORMAL LOW (ref 12.0–15.0)
Immature Granulocytes: 0 %
Lymphocytes Relative: 33 %
Lymphs Abs: 2.3 10*3/uL (ref 0.7–4.0)
MCH: 25.3 pg — ABNORMAL LOW (ref 26.0–34.0)
MCHC: 28.4 g/dL — ABNORMAL LOW (ref 30.0–36.0)
MCV: 89 fL (ref 80.0–100.0)
Monocytes Absolute: 0.4 10*3/uL (ref 0.1–1.0)
Monocytes Relative: 6 %
Neutro Abs: 4 10*3/uL (ref 1.7–7.7)
Neutrophils Relative %: 59 %
Platelets: 260 10*3/uL (ref 150–400)
RBC: 3.64 MIL/uL — ABNORMAL LOW (ref 3.87–5.11)
RDW: 16.9 % — ABNORMAL HIGH (ref 11.5–15.5)
WBC: 6.9 10*3/uL (ref 4.0–10.5)
nRBC: 0 % (ref 0.0–0.2)

## 2021-07-30 LAB — RESP PANEL BY RT-PCR (FLU A&B, COVID) ARPGX2
Influenza A by PCR: NEGATIVE
Influenza B by PCR: NEGATIVE
SARS Coronavirus 2 by RT PCR: NEGATIVE

## 2021-07-30 LAB — BASIC METABOLIC PANEL
Anion gap: 6 (ref 5–15)
BUN: 21 mg/dL (ref 8–23)
CO2: 29 mmol/L (ref 22–32)
Calcium: 9 mg/dL (ref 8.9–10.3)
Chloride: 105 mmol/L (ref 98–111)
Creatinine, Ser: 1.34 mg/dL — ABNORMAL HIGH (ref 0.44–1.00)
GFR, Estimated: 42 mL/min — ABNORMAL LOW (ref >60)
Glucose, Bld: 97 mg/dL (ref 70–99)
Potassium: 4 mmol/L (ref 3.5–5.1)
Sodium: 140 mmol/L (ref 135–145)

## 2021-07-30 LAB — CREATININE, URINE, RANDOM: Creatinine, Urine: 69.02 mg/dL

## 2021-07-30 LAB — SODIUM, URINE, RANDOM: Sodium, Ur: 107 mmol/L

## 2021-07-30 MED ORDER — SIMETHICONE 80 MG PO CHEW: 80.0000 mg | CHEWABLE_TABLET | Freq: Four times a day (QID) | ORAL | Status: DC | PRN

## 2021-07-30 MED ORDER — ROPINIROLE HCL 0.5 MG PO TABS
1.0000 mg | ORAL_TABLET | Freq: Every day | ORAL | Status: DC
Start: 2021-07-30 — End: 2021-08-02
  Administered 2021-07-31 (×2): 1 mg via ORAL
  Filled 2021-07-30 (×2): qty 1
  Filled 2021-07-30: qty 2

## 2021-07-30 MED ORDER — BICTEGRAVIR-EMTRICITAB-TENOFOV 50-200-25 MG PO TABS
1.0000 | ORAL_TABLET | Freq: Every day | ORAL | Status: DC
  Administered 2021-08-01: 1 via ORAL
  Filled 2021-07-30 (×2): qty 1

## 2021-07-30 MED ORDER — UMECLIDINIUM BROMIDE 62.5 MCG/ACT IN AEPB
1.0000 | INHALATION_SPRAY | Freq: Every day | RESPIRATORY_TRACT | Status: DC
  Administered 2021-08-01: 1 via RESPIRATORY_TRACT
  Filled 2021-07-30 (×2): qty 7

## 2021-07-30 MED ORDER — MORPHINE SULFATE (PF) 2 MG/ML IV SOLN: 2.0000 mg | INTRAVENOUS | Status: DC | PRN

## 2021-07-30 MED ORDER — ALBUTEROL SULFATE HFA 108 (90 BASE) MCG/ACT IN AERS: 1.0000 | INHALATION_SPRAY | Freq: Four times a day (QID) | RESPIRATORY_TRACT | Status: DC | PRN

## 2021-07-30 MED ORDER — BUSPIRONE HCL 5 MG PO TABS
2.5000 mg | ORAL_TABLET | Freq: Three times a day (TID) | ORAL | Status: DC
  Administered 2021-07-30 – 2021-08-01 (×5): 2.5 mg via ORAL
  Filled 2021-07-30: qty 1
  Filled 2021-07-30 (×2): qty 0.5
  Filled 2021-07-30: qty 1
  Filled 2021-07-30 (×2): qty 0.5
  Filled 2021-07-30 (×2): qty 1

## 2021-07-30 MED ORDER — IOHEXOL 350 MG/ML SOLN
50.0000 mL | Freq: Once | INTRAVENOUS | Status: AC | PRN
  Administered 2021-07-30: 50 mL via INTRAVENOUS

## 2021-07-30 MED ORDER — ACETAMINOPHEN 325 MG PO TABS: 650.0000 mg | ORAL_TABLET | Freq: Four times a day (QID) | ORAL | Status: DC | PRN

## 2021-07-30 MED ORDER — HYDROCODONE-ACETAMINOPHEN 5-325 MG PO TABS
1.0000 | ORAL_TABLET | ORAL | Status: DC | PRN
  Administered 2021-07-31 – 2021-08-01 (×2): 2 via ORAL
  Filled 2021-07-30 (×3): qty 2

## 2021-07-30 MED ORDER — MOMETASONE FURO-FORMOTEROL FUM 200-5 MCG/ACT IN AERO
2.0000 | INHALATION_SPRAY | Freq: Two times a day (BID) | RESPIRATORY_TRACT | Status: DC
  Administered 2021-07-31 – 2021-08-01 (×2): 2 via RESPIRATORY_TRACT
  Filled 2021-07-30: qty 8.8

## 2021-07-30 MED ORDER — ACETAMINOPHEN 650 MG RE SUPP: 650.0000 mg | Freq: Four times a day (QID) | RECTAL | Status: DC | PRN

## 2021-07-30 MED ORDER — ALBUTEROL SULFATE (2.5 MG/3ML) 0.083% IN NEBU
2.5000 mg | INHALATION_SOLUTION | RESPIRATORY_TRACT | Status: DC | PRN
  Filled 2021-07-30: qty 3

## 2021-07-30 MED ORDER — METHOCARBAMOL 1000 MG/10ML IJ SOLN
500.0000 mg | Freq: Four times a day (QID) | INTRAVENOUS | Status: DC | PRN
  Administered 2021-08-01 (×2): 500 mg via INTRAVENOUS
  Filled 2021-07-30: qty 500
  Filled 2021-07-30 (×3): qty 5

## 2021-07-30 MED ORDER — QUETIAPINE FUMARATE ER 50 MG PO TB24
150.0000 mg | ORAL_TABLET | Freq: Every day | ORAL | Status: DC
  Administered 2021-07-31 (×2): 150 mg via ORAL
  Filled 2021-07-30 (×3): qty 3

## 2021-07-30 MED ORDER — SODIUM CHLORIDE 0.9 % IV SOLN
75.0000 mL/h | INTRAVENOUS | Status: AC
  Administered 2021-07-31: 75 mL/h via INTRAVENOUS

## 2021-07-30 MED ORDER — VENLAFAXINE HCL ER 75 MG PO CP24
150.0000 mg | ORAL_CAPSULE | Freq: Every day | ORAL | Status: DC
  Administered 2021-08-01: 150 mg via ORAL
  Filled 2021-07-30: qty 2
  Filled 2021-07-30: qty 1

## 2021-07-30 MED ORDER — GUAIFENESIN ER 600 MG PO TB12
600.0000 mg | ORAL_TABLET | Freq: Two times a day (BID) | ORAL | Status: DC
  Administered 2021-07-30 – 2021-08-01 (×3): 600 mg via ORAL
  Filled 2021-07-30 (×3): qty 1

## 2021-07-30 NOTE — Assessment & Plan Note (Signed)
Residual left side weakness chronic

## 2021-07-30 NOTE — Assessment & Plan Note (Signed)
Pt is on Eliquis given hemoptysis will hold off for tonight, if pt would like to transition to comfort would need to have discussion if this should be stopped

## 2021-07-30 NOTE — Assessment & Plan Note (Addendum)
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  Followed by Dr. Mickeal Skinner at this point patient states she would like [name] to mainly concentrate on comfort she is DO NOT RESUSCITATE DO NOT INTUBATE.  Palliative care consulted We will email Dr. Mickeal Skinner the patient is being admitted

## 2021-07-30 NOTE — Assessment & Plan Note (Signed)
Chronic stable continue home medications ?

## 2021-07-30 NOTE — Assessment & Plan Note (Signed)
-  chronic avoid nephrotoxic medications such as NSAIDs, Vanco Zosyn combo,  avoid hypotension, continue to follow renal function  

## 2021-07-30 NOTE — Assessment & Plan Note (Signed)
History of COPD on baseline 3 L.  Continue

## 2021-07-30 NOTE — ED Provider Notes (Signed)
°  Physical Exam  BP 133/66 (BP Location: Right Arm)    Pulse 74    Temp 97.9 F (36.6 C) (Oral)    Resp 17    SpO2 94%     Procedures  Ultrasound ED Peripheral IV (Provider)  Date/Time: 07/30/2021 10:12 PM Performed by: Regan Lemming, MD Authorized by: Regan Lemming, MD   Procedure details:    Indications: multiple failed IV attempts     Skin Prep: chlorhexidine gluconate     Location:  Right AC   Angiocath:  22 G   Bedside Ultrasound Guided: Yes     Images: not archived     Patient tolerated procedure without complications: Yes     Dressing applied: Yes    ED Course / MDM    Medical Decision Making Amount and/or Complexity of Data Reviewed Labs: ordered. Radiology: ordered.  Risk Prescription drug management. Decision regarding hospitalization.   89F, presenting with cough and hemoptysis. 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  Presenting from [facility]. Pt is a DNR per palliative notes on 06/13/21.   Patient presents with worsening hemoptysis over the past 5 days.  She has a known history of metastatic lung cancer and is not currently on any treatment.  She has had previously undergone palliative consultation outpatient.  Hemoptysis has progressed where it used to be intermittent and is now daily/every few hours with the passage of some clots.  She is protecting her airway.  She is on 3 L O2 nasal cannula at baseline at her facility.  She is also on Eliquis.  CTA imaging was performed after ultrasound-guided IV access was obtained which revealed no evidence of PE, enlarging left lower lobe mass consistent with progressive lung cancer with surrounding thickening concerning for lymphangitic spread of disease with some postobstructive changes noted.  Of note, the patient is afebrile, without significant dyspnea, with no leukocytosis concerning for postobstructive pneumonia.  She was found to have right lower lobe  pulmonary nodules consistent with likely worsening metastatic disease.  A CT head was performed which was unchanged from prior craniotomy.  Overall, the patient is well-appearing but with worsening hemoptysis is concerning for progression of disease.  I did speak with on-call palliative care who did recommend admission for observation for further goals of care discussions with the patient.  I talked with Dr. Roel Cluck of hospitalist medicine who accepted the patient in admission.       Regan Lemming, MD 07/31/21 0030

## 2021-07-30 NOTE — ED Notes (Signed)
Pt transported to xray 

## 2021-07-30 NOTE — Assessment & Plan Note (Signed)
Chronic continue home medications at this point patient was not endorsing delusional thoughts but has in the past reported parasites that was talking to her on the phone.  Continue psychiatric medications

## 2021-07-30 NOTE — H&P (Signed)
[name]:[id number] DOB: 1946/03/14 DOA: 07/30/2021   PCP: Nolene Ebbs, MD   Outpatient Specialists:      Oncology Dr. Mickeal Skinner   [facility] [place]  Patient arrived to ER on 07/30/21 at 1311 Referred by Attending Toy Baker, MD   Patient coming from: from [facility]    Chief Complaint:   Chief Complaint  Patient presents with   Cough   Hemoptysis    HPI: [name] is a 76 y.o. 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  At baseline on 3L of oxygen Has had decreased PO intake Pt is on chronic eliquis  Patient reports her main complaint has been that she has not been able to get as much food and water as she wishes at the facility.  She would like to go back home. She also reports that she has been having some left side pain when she tries to move feels like a pulled muscle Reports that her stools have been somewhat sticky although she does not know if they have been black Reports that she has been having some urinary incontinence has been going on for years Also reports that when she moves she feels like she has to urinate has been going on for years States she needs her vision to be addressed because she may need glasses Also would like a dental care to be addressed as well Patient states she is bedbound but able to exercise Has hard time setting up ever since her stroke  Has   been vaccinated against COVID    Initial COVID TEST  NEGATIVE   Lab Results  Component Value Date   [place] 07/30/2021   [place] NEGATIVE 03/11/2021   [place] NEGATIVE 02/22/2021   [place] NEGATIVE 02/17/2021     Regarding pertinent Chronic problems:     HIV on Biktarvy       obesity-   BMI Readings  from Last 1 Encounters:  03/11/21 32.23 kg/m        COPD - not **followed by pulmonology    on baseline oxygen 3L,   On Advair spiriva      Hx of CVA - with residual deficits on eliquis     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  Lab Results  Component Value Date   CREATININE 1.34 (H) 07/30/2021   CREATININE 1.17 (H) 06/06/2021   CREATININE 1.26 (H) 05/20/2021       Chronic anemia - baseline hg Hemoglobin & Hematocrit  Recent Labs    05/20/21 1349 06/06/21 1107 07/30/21 1805  HGB 9.8* 9.9* 9.2*     While in ER:   CXR showed possible PNA CTA showed worsening LUng CA No PE     CT HEAD   NON acute  CXR - ? PNA   CTA chest -   no PE,  no evidence of infiltrate progression of Cancer  Following Medications were ordered in ER: Medications  iohexol (OMNIPAQUE) 350 MG/ML injection 50 mL (50 mLs Intravenous Contrast Given 07/30/21 2013)    ______    ED Triage Vitals [07/30/21 1251]  Enc  Vitals Group     BP 133/66     Pulse Rate 74     Resp 17     Temp 97.9 F (36.6 C)     Temp Source Oral     SpO2 94 %     Weight      Height      Head Circumference      Peak Flow      Pain Score      Pain Loc      Pain Edu?      Excl. in [place]?   ZHGD(92)@     _________________________________________ Significant initial  Findings: Abnormal Labs Reviewed  CBC WITH DIFFERENTIAL/PLATELET - Abnormal; Notable for the following components:      Result Value   RBC 3.64 (*)    Hemoglobin 9.2 (*)    HCT 32.4 (*)    MCH 25.3 (*)    MCHC 28.4 (*)    RDW 16.9 (*)    All other components within normal limits  BASIC METABOLIC PANEL - Abnormal; Notable for the following components:   Creatinine, Ser 1.34 (*)    GFR, Estimated 42 (*)    All other components within normal limits      The recent clinical data is shown below. Vitals:   07/30/21 1251 07/30/21 2042  BP: 133/66    Pulse: 74   Resp: 17   Temp: 97.9 F (36.6 C)   TempSrc: Oral   SpO2: 94% 100%    WBC     Component Value Date/Time   WBC 6.9 07/30/2021 1805   LYMPHSABS 2.3 07/30/2021 1805   LYMPHSABS 3.5 (H) 12/28/2013 0920   MONOABS 0.4 07/30/2021 1805   MONOABS 0.3 12/28/2013 0920   EOSABS 0.1 07/30/2021 1805   EOSABS 0.1 12/28/2013 0920   BASOSABS 0.0 07/30/2021 1805   BASOSABS 0.0 12/28/2013 0920      UA ordered     Results for orders placed or performed during the hospital encounter of 07/30/21  Resp Panel by RT-PCR (Flu A&B, Covid) Nasopharyngeal Swab     Status: None   Collection Time: 07/30/21  3:13 PM   Specimen: Nasopharyngeal Swab; Nasopharyngeal(NP) swabs in vial transport medium  Result Value Ref Range Status   SARS Coronavirus 2 by RT PCR NEGATIVE NEGATIVE Final         Influenza A by PCR NEGATIVE NEGATIVE Final   Influenza B by PCR NEGATIVE NEGATIVE Final          _______________________________________________ Hospitalist was called for admission for hemoptysis and decreased p.o. intake  The following Work up has been ordered so far:  Orders Placed This Encounter  Procedures   Resp Panel by RT-PCR (Flu A&B, Covid) Nasopharyngeal Swab   DG Chest 1 View   CT Angio Chest PE W and/or Wo Contrast   CT HEAD WO CONTRAST (5MM)   CBC with Differential   Basic metabolic panel   Cardiac monitoring   Consult to Palliative Care   Consult to hospitalist   Place in observation (patient's expected length of stay will be less than 2 midnights)     OTHER Significant initial  Findings:  labs showing:    Recent Labs  Lab 07/30/21 1805  NA 140  K 4.0  CO2 29  GLUCOSE 97  BUN 21  CREATININE 1.34*  CALCIUM 9.0    Cr  Up from baseline see below Lab Results  Component Value Date   CREATININE 1.34 (H) 07/30/2021   CREATININE  1.17 (H) 06/06/2021   CREATININE 1.26 (H) 05/20/2021    No results for input(s): AST, ALT, ALKPHOS, BILITOT, PROT, ALBUMIN in the last  168 hours. Lab Results  Component Value Date   CALCIUM 9.0 07/30/2021   PHOS 3.1 02/13/2019          Plt: Lab Results  Component Value Date   PLT 260 07/30/2021    COVID-19 Labs  No results for input(s): DDIMER, FERRITIN, LDH, CRP in the last 72 hours.  Lab Results  Component Value Date   SARSCOV2NAA NEGATIVE 07/30/2021   SARSCOV2NAA NEGATIVE 03/11/2021   SARSCOV2NAA NEGATIVE 02/22/2021   [place] NEGATIVE 02/17/2021       Recent Labs  Lab 07/30/21 1805  WBC 6.9  NEUTROABS 4.0  HGB 9.2*  HCT 32.4*  MCV 89.0  PLT 260    HG/HCT  stable,       Component Value Date/Time   HGB 9.2 (L) 07/30/2021 1805   HGB 15.0 12/28/2013 0920   HCT 32.4 (L) 07/30/2021 1805   HCT 47.6 (H) 12/28/2013 0920   MCV 89.0 07/30/2021 1805   MCV 90.5 12/28/2013 0920      Cardiac Panel (last 3 results) No results for input(s): CKTOTAL, CKMB, TROPONINI, RELINDX in the last 72 hours.        Cultures:    Component Value Date/Time   SDES BLOOD LEFT HAND 02/19/2021 1147   [place]  02/19/2021 1147    BOTTLES DRAWN AEROBIC ONLY Blood Culture results may not be optimal due to an inadequate volume of blood received in culture bottles   CULT  02/19/2021 1147    NO GROWTH 5 DAYS Performed at [facility], [place] [address], [place], [place] [address]    REPTSTATUS 02/24/2021 FINAL 02/19/2021 1147     Radiological Exams on Admission: DG Chest 1 View  Result Date: 07/30/2021 CLINICAL DATA:  Cough, hemoptysis EXAM: CHEST  1 VIEW COMPARISON:  05/20/2021 FINDINGS: Bilateral interstitial thickening. Left perihilar band like area of airspace disease. No pleural effusion or pneumothorax. Stable cardiomediastinal silhouette. No aggressive osseous lesion. IMPRESSION: 1. Left perihilar airspace disease concerning for pneumonia versus post treatment changes. The pre see demonstrated mass in the superior segment of the left lower lobe in this area is not well visualized. If there is further  clinical concern, recommend a CT of the chest with intravenous contrast. Electronically Signed   By: Kathreen Devoid M.D.   On: 07/30/2021 13:54   CT HEAD WO CONTRAST (5MM)  Result Date: 07/30/2021 CLINICAL DATA:  History of CNS neoplasm, hemoptysis EXAM: CT HEAD WITHOUT CONTRAST TECHNIQUE: Contiguous axial images were obtained from the base of the skull through the vertex without intravenous contrast. RADIATION DOSE REDUCTION: This exam was performed according to the departmental dose-optimization program which includes automated exposure control, adjustment of the mA and/or kV according to patient size and/or use of iterative reconstruction technique. COMPARISON:  02/18/2021 CT, correlation is also made with MRI head 03/25/2021 FINDINGS: Brain: Prior right frontoparietal craniotomy for tumor resection. Overall unchanged hypodensity in the right frontoparietal and posterior temporal lobes compared to 02/18/2021. Evaluation for mass is limited in the absence of intravenous contrast. No acute hemorrhage or midline shift. No extra-axial collection or hydrocephalus. Vascular: No hyperdense vessel. Skull: Right frontoparietal craniotomy. No acute fracture or focal lesion. Sinuses/Orbits: The sinuses are clear. Prior left orbital inferior rim fracture. Other: The mastoids are well aerated. IMPRESSION: 1. No acute intracranial process. 2. Prior right frontoparietal craniotomy for tumor resection,  with overall unchanged edema in the right frontal, parietal, and temporal lobes. Electronically Signed   By: Merilyn Baba M.D.   On: 07/30/2021 20:25   CT Angio Chest PE W and/or Wo Contrast  Result Date: 07/30/2021 CLINICAL DATA:  Cough, hemoptysis, history of metastatic lung cancer EXAM: CT ANGIOGRAPHY CHEST WITH CONTRAST TECHNIQUE: Multidetector CT imaging of the chest was performed using the standard protocol during bolus administration of intravenous contrast. Multiplanar CT image reconstructions and MIPs were obtained  to evaluate the vascular anatomy. RADIATION DOSE REDUCTION: This exam was performed according to the departmental dose-optimization program which includes automated exposure control, adjustment of the mA and/or kV according to patient size and/or use of iterative reconstruction technique. CONTRAST:  57mL OMNIPAQUE IOHEXOL 350 MG/ML SOLN COMPARISON:  03/12/2021, 07/30/2021 FINDINGS: Cardiovascular: This is a technically adequate evaluation of the pulmonary vasculature. No filling defects or pulmonary emboli. The heart is unremarkable without pericardial effusion. 4 cm ascending thoracic aortic aneurysm again noted. No evidence of dissection. Mild atherosclerosis. Mediastinum/Nodes: Subcentimeter pretracheal and AP window adenopathy unchanged. No pathologically enlarged lymph nodes. Thyroid, trachea, and esophagus are stable. Lungs/Pleura: The spiculated left perihilar mass within the superior segment left lower lobe now measures 4.9 x 4.1 cm reference image 51/7, previously having measured 3.6 x 3.2 cm. There is increasing surrounding nodular interlobular septal thickening concerning for lymphangitic spread of disease. Increasing opacification of the left lower lobe bronchus, with likely postobstructive changes within the basilar segments of the left lower lobe. Stable 5 mm left upper lobe nodule reference image 26/7. Stable emphysema. 1.2 x 1.0 cm right lower lobe nodule is seen in the right perihilar region image 66/7, increased since prior study where this had measured 0.9 x 0.7 cm. No effusion or pneumothorax. Upper Abdomen: No acute abnormality. Musculoskeletal: Remote posttraumatic changes of the left lateral fifth and 6 ribs, stable. No acute bony abnormalities. Reconstructed images demonstrate no additional findings. Review of the MIP images confirms the above findings. IMPRESSION: 1. Enlarging left lower lobe mass consistent with progressive lung cancer. Surrounding nodular interlobular septal thickening  concerning for lymphangitic spread of disease. Scattered postobstructive changes within the basilar segments of the left lower lobe due to narrowing of the left lower lobe bronchus by the dominant superior segment left lower lobe mass. 2. Enlarging right lower lobe pulmonary nodule consistent with worsening metastases. 3. Stable nonspecific left upper lobe 5 mm pulmonary nodule. 4. No evidence of pulmonary embolus. 5. Stable 4 cm ascending thoracic aortic aneurysm. Recommend annual imaging followup by CTA or MRA. This recommendation follows 2010 ACCF/AHA/AATS/ACR/ASA/SCA/SCAI/SIR/STS/SVM Guidelines for the Diagnosis and Management of Patients with Thoracic Aortic Disease. Circulation. 2010; 121: W102-V253. Aortic aneurysm NOS (ICD10-I71.9) 6. Aortic Atherosclerosis (ICD10-I70.0) and Emphysema (ICD10-J43.9). Electronically Signed   By: Randa Ngo M.D.   On: 07/30/2021 20:26   _______________________________________________________________________________________________________ Latest  Blood pressure 133/66, pulse 74, temperature 97.9 F (36.6 C), temperature source Oral, resp. rate 17, SpO2 100 %.   Vitals  labs and radiology finding personally reviewed  Review of Systems:    Pertinent positives include:  , fatigue,dyspnea on exertion,  hematemesis joint pain   Constitutional:  No weight loss, night sweats, Fevers, chills weight loss  HEENT:  No headaches, Difficulty swallowing,Tooth/dental problems,Sore throat,  No sneezing, itching, ear ache, nasal congestion, post nasal drip,  Cardio-vascular:  No chest pain, Orthopnea, PND, anasarca, dizziness, palpitations.no Bilateral lower extremity swelling  GI:  No heartburn, indigestion, abdominal pain, nausea, vomiting, diarrhea, change in bowel habits, loss of  appetite, melena, blood in stool,  Resp:  no shortness of breath at rest. No No excess mucus, no productive cough, No non-productive cough, No coughing up of blood.No change in color of  mucus.No wheezing. Skin:  no rash or lesions. No jaundice GU:  no dysuria, change in color of urine, no urgency or frequency. No straining to urinate.  No flank pain.  Musculoskeletal:  [place] no joint swelling. No decreased range of motion. No back pain.  Psych:  No change in mood or affect. No depression or anxiety. No memory loss.  Neuro: no localizing neurological complaints, no tingling, no weakness, no double vision, no gait abnormality, no slurred speech, no confusion  All systems reviewed and apart from [place] all are negative _______________________________________________________________________________________________ Past Medical History:   Past Medical History:  Diagnosis Date   Asthma    Atypical squamous cells of undetermined significance (ASCUS) on Papanicolaou smear of cervix    Bed bug bite 07/12/2019   Cancer (HCC)    COPD (chronic obstructive pulmonary disease) (HCC)    H/O drainage of abscess    left axilla, from left breast   Hep B w/o coma    HIV (human immunodeficiency virus infection) ([place])    Hyperlipidemia    Lung cancer ([place]) 12/14/13   LUL Adenocarcinoma   Neuromuscular disorder (HCC)    fingers/feet neuropathy   Non-small cell lung cancer ([place])    Parasite infection 06/06/2021   Renal disorder    S/P radiation therapy 01/26/14-02/02/14   sbrt  lt upper lung-54Gy/29fx   Stroke [facility])       Past Surgical History:  Procedure Laterality Date   ABCESS DRAINAGE     abcess under Left arm, abcess from L breast   APPLICATION OF CRANIAL NAVIGATION Right 11/28/2019   Procedure: APPLICATION OF CRANIAL NAVIGATION;  Surgeon: [name], MD;  Location: [place];  Service: Neurosurgery;  Laterality: Right;   BRAIN SURGERY     BUBBLE STUDY  03/08/2019   Procedure: BUBBLE STUDY;  Surgeon: [name], MD;  Location: [facility];  Service: Cardiovascular;;   CRANIOTOMY Right 11/28/2019   Procedure: Right Craniotomy for Tumor Resection;  Surgeon: [name], MD;  Location: [facility];  Service: Neurosurgery;  Laterality: Right;   CRANIOTOMY     removal of abnormal cells on uterus     TEE WITHOUT CARDIOVERSION N/A 03/08/2019   Procedure: TRANSESOPHAGEAL ECHOCARDIOGRAM (TEE);  Surgeon: [name], MD;  Location: [facility] ENDOSCOPY;  Service: Cardiovascular;  Laterality: N/A;   thyroid gland removed      Social History:  Ambulatory  bed bound     reports that she has never smoked. She has never used smokeless tobacco. She reports that she does not currently use alcohol. She reports that she does not currently use drugs.     Family History:   Family History  Problem Relation Age of Onset   Pneumonia Mother    Hypertension Sister    Diabetes Sister    Cancer Sister        pancreatic and lung   Cancer Brother        pancreatic   Cancer Brother        pancreatic   ______________________________________________________________________________________________ Allergies: No Known Allergies   Prior to Admission medications   Medication Sig Start Date End Date Taking? Authorizing Provider  albuterol (VENTOLIN HFA) 108 (90 Base) MCG/ACT inhaler Inhale 1 puff into the lungs every 6 (six) hours as needed for shortness of breath.  12/23/19  Yes [provider]  ammonium lactate (LAC-HYDRIN) 12 % lotion Apply 1 application topically in the morning and at bedtime.   Yes [provider]  apixaban (ELIQUIS) 5 MG TABS tablet Take 1 tablet (5 mg total) by mouth 2 (two) times daily. 03/13/19  Yes Hosie Poisson, MD  bictegravir-emtricitabine-tenofovir AF (BIKTARVY) 50-200-25 MG TABS tablet Take 1 tablet by mouth daily. 07/12/19  Yes Tommy Medal, Lavell Islam, MD  busPIRone (BUSPAR) 5 MG tablet Take 2.5 mg by mouth 3 (three) times daily.   Yes [provider]  carboxymethylcellulose (ARTIFICIAL TEARS) 1 % ophthalmic solution Place 1 drop into both eyes every 12 (twelve) hours.   Yes [provider]  Fluticasone-Salmeterol  (ADVAIR) 500-50 MCG/DOSE AEPB Inhale 1 puff into the lungs every 12 (twelve) hours. 09/04/19  Yes [provider]  gabapentin (NEURONTIN) 800 MG tablet Take 800 mg by mouth 3 (three) times daily. 10/26/19  Yes [provider]  Melatonin 3 MG CAPS Take 3 mg by mouth at bedtime.   Yes [provider]  montelukast (SINGULAIR) 10 MG tablet Take 10 mg by mouth at bedtime.  09/04/19  Yes [provider]  oxycodone (OXY-IR) 5 MG capsule Take 5 mg by mouth 3 (three) times daily.   Yes [provider]  OXYGEN Inhale 3 L into the lungs continuous.   Yes [provider]  polyethylene glycol (MIRALAX / GLYCOLAX) 17 g packet Take 17 g by mouth 2 (two) times daily. Mix in 4-8 oz liquid and drink 05/20/21  Yes Plunkett, Loree Fee, MD  QUEtiapine Fumarate (SEROQUEL XR) 150 MG 24 hr tablet Take 150 mg by mouth at bedtime. 11/09/19  Yes [provider]  rOPINIRole (REQUIP) 1 MG tablet Take 1 tablet (1 mg total) by mouth at bedtime. 05/20/19  Yes Ward, Delice Bison, DO  sennosides-docusate sodium (SENOKOT-S) 8.6-50 MG tablet Take 1 tablet by mouth at bedtime.   Yes [provider]  simethicone (MYLICON) 937 MG chewable tablet Chew 125 mg by mouth in the morning and at bedtime. May also chew an additional 125mg  every 6 hours as needed for bloating/gas   Yes [provider]  Skin Protectants, Misc. (EUCERIN) cream Apply 1 application topically daily. To entire body   Yes [provider]  SPIRIVA RESPIMAT 2.5 MCG/ACT AERS INHALE 2 PUFFS INTO THE LUNGS DAILY Patient taking differently: Inhale 2 puffs into the lungs daily. 09/24/14  Yes Rigoberto Noel, MD  tiZANidine (ZANAFLEX) 4 MG tablet Take 4 mg by mouth every 12 (twelve) hours as needed (pain). 11/08/19  Yes [provider]  venlafaxine XR (EFFEXOR-XR) 150 MG 24 hr capsule Take 150 mg by mouth daily.   Yes [provider]  vitamin C (ASCORBIC ACID) 500 MG tablet Take 500 mg  by mouth daily.   Yes [provider]  doxycycline (VIBRAMYCIN) 100 MG capsule Take 1 capsule (100 mg total) by mouth 2 (two) times daily. Patient not taking: Reported on 07/30/2021 03/12/21   Larene Pickett, PA-C  traMADol (ULTRAM) 50 MG tablet Take 1 tablet (50 mg total) by mouth in the morning, at noon, and at bedtime. Patient not taking: Reported on 07/30/2021 01/09/21   Darliss Cheney, MD    ___________________________________________________________________________________________________ Physical Exam: Vitals with BMI 07/30/2021 06/06/2021 05/20/2021  Height - - -  Weight - - -  BMI - - -  Systolic 902 409 735  Diastolic 66 71 74  Pulse 74 76 67  1. General:  in No  Acute distress   Chronically ill   -appearing 2. Psychological: Alert and   Oriented 3. Head/ENT:   Dry Mucous Membranes                          Head Non traumatic, neck supple                         Poor Dentition 4. SKIN:  decreased Skin turgor,  Skin clean Dry and intact no rash 5. Heart: Regular rate and rhythm no  Murmur, no Rub or gallop 6. Lungs  no wheezes or crackles   7. Abdomen: Soft,  non-tender, Non distended   obese  bowel sounds present 8. Lower extremities: no clubbing, cyanosis, no  edema 9. Neurologically  Left side weakness and contractures 10. MSK: Normal range of motion    Chart has been reviewed  ______________________________________________________________________________________________  Assessment/Plan 76 y.o. 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,  Admitted for mild aki and hemoptysis  Present on Admission:  Hemoptysis  HIV (human immunodeficiency virus infection) ([place])  COPD mixed type ([place])  Hypertension  Obesity  Non-small cell lung cancer ([place])  Chronic respiratory failure with hypoxia ([place])  Left lateral abdominal pain  CKD (chronic kidney disease), stage III ([place])  History of pulmonary embolus (PE)  Delusional  disorder ([place])  AKI (acute kidney injury) ([place])    HIV (human immunodeficiency virus infection) ([place]) Continue Biktarvy Lab Results  Component Value Date   HIV1RNAQUANT <20 (H) 06/06/2021   Lab Results  Component Value Date   CD4TABS 481 02/19/2021   CD4TABS 714 12/01/2019   CD4TABS 470 11/21/2019      COPD mixed type ([place]) Chronic stable continue home medications  Hypertension Allow permissive hypertension  Obesity Chronic follow-up as an outpatient  Non-small cell lung cancer ([place]) With known metastases to the brain.  Followed by Dr. Mickeal Skinner at this point patient states she would like [name] to mainly concentrate on comfort she is DO NOT RESUSCITATE DO NOT INTUBATE.  Palliative care consulted We will email Dr. Mickeal Skinner the patient is being admitted  Chronic respiratory failure with hypoxia ([place]) History of COPD on baseline 3 L.  Continue  Hemoptysis Chronic has been going on at least for the past 1 year but possibly have worsened.  This could be explained by progression of pulmonary disease.  Need to discuss with primary oncology if would be a good time to discontinue her Eliquis as patient is interested in comfort and continues hemoptysis is distressing to her Hold Eliquis for tonight Continue to monitor   History of CVA with residual deficit Residual left side weakness chronic  Left lateral abdominal pain Mild appears to be positional musculoskeletal.  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  Continue psychiatric medications  AKI (acute kidney injury) ([place]) Mild likely secondary to decreased p.o. intake.  We will rehydrate gently and follow scale questionnaire    Other plan as per orders.  DVT prophylaxis:  SCD     Code Status:  DNR/DNI  pt would like to be focussed on comfort care but ok to give iv fluids as per patient  I had personally discussed CODE STATUS with patient    Family Communication:   Family not at  Bedside  Disposition Plan:                              Back to current facility when stable                           Following barriers for discharge:                                                   Anemia stable                             Pain controlled with PO medications                                                Would benefit from PT/OT eval prior to DC  Ordered                                    Transition of care consulted                   Nutrition    consulted                                Palliative care    consulted              Consults called: emailed Dr. Mickeal Skinner  Admission status:  ED Disposition     ED Disposition  [place]: [place] [[id number]]  Level of Care: Telemetry Medical [104]  May place patient in observation at [facility] or [facility] if equivalent level of care is available:: No  Covid Evaluation: Asymptomatic Screening Protocol (No Symptoms)  Diagnosis: Hemoptysis [741287]  Admitting Physician: Toy Baker [[id number]]  Attending Physician: Toy Baker [[id number]]           Obs     Level of care     tele  For 12H    Lab Results  Component Value Date   [place] 07/30/2021     Precautions: admitted as   Covid Negative      Valorie Mcgrory 07/30/2021, 10:15 PM    [facility]      after 2 AM please page floor coverage PA If 7AM-7PM, please contact the day team taking care of the patient using [email or web address]   Patient was evaluated in the context of the global COVID-19 pandemic,  which necessitated consideration that the patient might be at risk for infection with the SARS-CoV-2 virus that causes COVID-19. Institutional protocols and algorithms that pertain to the evaluation of patients at risk for COVID-19 are in a state of rapid change based on information released by regulatory bodies including the CDC and federal and state organizations. These policies and algorithms were followed during the patient's care.

## 2021-07-30 NOTE — ED Notes (Signed)
Unsuccessful IV/lab draw attempt.  Mini lab tech asked to attempt.

## 2021-07-30 NOTE — ED Provider Notes (Signed)
[place] Provider Note   CSN: [id number] Arrival date & time: 07/30/21  1311     History  Chief Complaint  Patient presents with   Cough   Hemoptysis    [name] is a 76 y.o. female.  States that she continues to cough up blood at times.  Has history of HIV, lung cancer not on any treatment for her lung cancer.  She has been coughing up blood on and off for about a year.  She is on 3 L of oxygen at baseline.  She denies any chest pain or shortness of breath.  She denies any ongoing discomfort now.  She states that her aide was concerned for may be dehydration as she is not been eating and drinking as much.  She states that she is not coughing up any more blood than she normally does.  Denies any weakness or lightheadedness.  History of high cholesterol, lung cancer, stroke, HIV.   Cough Cough characteristics:  Productive Severity:  Moderate Onset quality:  Gradual     Home Medications Prior to Admission medications   Medication Sig Start Date End Date Taking? Authorizing Provider  albuterol (VENTOLIN HFA) 108 (90 Base) MCG/ACT inhaler Inhale 1 puff into the lungs every 6 (six) hours as needed for shortness of breath.  12/23/19  Yes [provider]  ammonium lactate (LAC-HYDRIN) 12 % lotion Apply 1 application topically in the morning and at bedtime.   Yes [provider]  apixaban (ELIQUIS) 5 MG TABS tablet Take 1 tablet (5 mg total) by mouth 2 (two) times daily. 03/13/19  Yes Hosie Poisson, MD  bictegravir-emtricitabine-tenofovir AF (BIKTARVY) 50-200-25 MG TABS tablet Take 1 tablet by mouth daily. 07/12/19  Yes Tommy Medal, Lavell Islam, MD  busPIRone (BUSPAR) 5 MG tablet Take 2.5 mg by mouth 3 (three) times daily.   Yes [provider]  carboxymethylcellulose (ARTIFICIAL TEARS) 1 % ophthalmic solution Place 1 drop into both eyes every 12 (twelve) hours.   Yes [provider]  Fluticasone-Salmeterol  (ADVAIR) 500-50 MCG/DOSE AEPB Inhale 1 puff into the lungs every 12 (twelve) hours. 09/04/19  Yes [provider]  gabapentin (NEURONTIN) 800 MG tablet Take 800 mg by mouth 3 (three) times daily. 10/26/19  Yes [provider]  Melatonin 3 MG CAPS Take 3 mg by mouth at bedtime.   Yes [provider]  montelukast (SINGULAIR) 10 MG tablet Take 10 mg by mouth at bedtime.  09/04/19  Yes [provider]  oxycodone (OXY-IR) 5 MG capsule Take 5 mg by mouth 3 (three) times daily.   Yes [provider]  OXYGEN Inhale 3 L into the lungs continuous.   Yes [provider]  polyethylene glycol (MIRALAX / GLYCOLAX) 17 g packet Take 17 g by mouth 2 (two) times daily. Mix in 4-8 oz liquid and drink 05/20/21  Yes Plunkett, Loree Fee, MD  QUEtiapine Fumarate (SEROQUEL XR) 150 MG 24 hr tablet Take 150 mg by mouth at bedtime. 11/09/19  Yes [provider]  rOPINIRole (REQUIP) 1 MG tablet Take 1 tablet (1 mg total) by mouth at bedtime. 05/20/19  Yes Ward, Delice Bison, DO  sennosides-docusate sodium (SENOKOT-S) 8.6-50 MG tablet Take 1 tablet by mouth at bedtime.   Yes [provider]  simethicone (MYLICON) 650 MG chewable tablet Chew 125 mg by mouth in the morning and at bedtime. May also chew an additional 125mg  every 6 hours as needed for bloating/gas   Yes [provider]  Skin Protectants, Misc. (EUCERIN) cream Apply 1 application topically daily. To entire body   Yes [provider]  SPIRIVA RESPIMAT 2.5 MCG/ACT AERS INHALE 2 PUFFS INTO THE LUNGS DAILY Patient taking differently: Inhale 2 puffs into the lungs daily. 09/24/14  Yes Rigoberto Noel, MD  tiZANidine (ZANAFLEX) 4 MG tablet Take 4 mg by mouth every 12 (twelve) hours as needed (pain). 11/08/19  Yes [provider]  venlafaxine XR (EFFEXOR-XR) 150 MG 24 hr capsule Take 150 mg by mouth daily.   Yes [provider]  vitamin C (ASCORBIC ACID) 500 MG tablet Take 500 mg  by mouth daily.   Yes [provider]  doxycycline (VIBRAMYCIN) 100 MG capsule Take 1 capsule (100 mg total) by mouth 2 (two) times daily. Patient not taking: Reported on 07/30/2021 03/12/21   Larene Pickett, PA-C  traMADol (ULTRAM) 50 MG tablet Take 1 tablet (50 mg total) by mouth in the morning, at noon, and at bedtime. Patient not taking: Reported on 07/30/2021 01/09/21   Darliss Cheney, MD      Allergies    Patient has no known allergies.    Review of Systems   Review of Systems  Respiratory:  Positive for cough.    Physical Exam Updated Vital Signs BP 133/66 (BP Location: Right Arm)    Pulse 74    Temp 97.9 F (36.6 C) (Oral)    Resp 17    SpO2 94%  Physical Exam Vitals and nursing note reviewed.  Constitutional:      General: She is not in acute distress.    Appearance: She is well-developed. She is not ill-appearing.  HENT:     Head: Normocephalic and atraumatic.     Nose: Nose normal.     Mouth/Throat:     Mouth: Mucous membranes are moist.  Eyes:     Extraocular Movements: Extraocular movements intact.     Conjunctiva/sclera: Conjunctivae normal.     Pupils: Pupils are equal, round, and reactive to light.  Cardiovascular:     Rate and Rhythm: Normal rate and regular rhythm.     Pulses: Normal pulses.     Heart sounds: Normal heart sounds. No murmur heard. Pulmonary:     Effort: Pulmonary effort is normal. No respiratory distress.     Breath sounds: Normal breath sounds.  Abdominal:     General: Abdomen is flat.     Palpations: Abdomen is soft.     Tenderness: There is no abdominal tenderness.  Musculoskeletal:        General: No swelling. Normal range of motion.     Cervical back: Normal range of motion and neck supple.  Skin:    General: Skin is warm and dry.     Capillary Refill: Capillary refill takes less than 2 seconds.  Neurological:     Mental Status: She is alert. Mental status is at baseline.     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    ED Results / Procedures / Treatments   Labs (all labs ordered are listed, but only abnormal results are displayed) Labs Reviewed  CBC WITH DIFFERENTIAL/PLATELET  BASIC METABOLIC PANEL    EKG None  Radiology DG Chest 1 View  Result Date: 07/30/2021 CLINICAL DATA:  Cough, hemoptysis EXAM: CHEST  1 VIEW COMPARISON:  05/20/2021 FINDINGS: Bilateral interstitial thickening. Left perihilar band like area of airspace disease.  No pleural effusion or pneumothorax. Stable cardiomediastinal silhouette. No aggressive osseous lesion. IMPRESSION: 1. Left perihilar airspace disease concerning for pneumonia versus post treatment changes. The pre see demonstrated mass in the superior segment of the left lower lobe in this area is not well visualized. If there is further clinical concern, recommend a CT of the chest with intravenous contrast. Electronically Signed   By: Kathreen Devoid M.D.   On: 07/30/2021 13:54    Procedures Procedures    Medications Ordered in ED Medications - No data to display  ED Course/ Medical Decision Making/ A&P                           Medical Decision Making Amount and/or Complexity of Data Reviewed Labs: ordered. Radiology: ordered.   [name] is here with cough, hemoptysis.  Normal vitals.  No fever.  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  She arrives on her 3 L of oxygen.  Very well-appearing.  States that she continues to have some blood when she coughs but this has been ongoing for months.  Does not state that there has been any increased frequency or amount.  When asked what was the primary reason for her visit today she states that her aide was worried about her nutrition.  She overall appears well.  Neurologically she appears to be at her baseline.  Will evaluate with CBC, BMP, chest x-ray.  Differential diagnosis includes ongoing oncology  process versus dehydration versus pneumonia.  She continues to be on blood thinner but overall will compare her hemoglobin to priors and chest x-ray.   My review and interpretation of the chest x-ray shows left-sided possible pneumonia versus atelectasis.  Radiology states possible pneumonia versus posttreatment changes.  Overall however lower suspicion for infectious process given no fever.  She appears very comfortable here.  CBC and BMP are pending at time of handoff to oncoming ED staff.  Please see their note for further results, evaluation, disposition of the patient.  Suspect that if lab work is unremarkable can be discharged.  This chart was dictated using voice recognition software.  Despite best efforts to proofread,  errors can occur which can change the documentation meaning.         Final Clinical Impression(s) / ED Diagnoses Final diagnoses:  Hemoptysis    Rx / DC Orders ED Discharge Orders     None         Lennice Sites, DO 07/30/21 1512

## 2021-07-30 NOTE — Telephone Encounter (Signed)
Scheduled appt per 1/30 referral. Spoke to [name] to sch her appt. She is aware of pt's appt date and time. She is aware pt needs to be here 15 mins prior to appt time.

## 2021-07-30 NOTE — ED Notes (Signed)
Patient transported to CT 

## 2021-07-30 NOTE — Assessment & Plan Note (Signed)
Mild likely secondary to decreased p.o. intake.  We will rehydrate gently and follow

## 2021-07-30 NOTE — Assessment & Plan Note (Addendum)
Chronic has been going on at least for the past 1 year but possibly have worsened.  This could be explained by progression of pulmonary disease.  Need to discuss with primary oncology if would be a good time to discontinue her Eliquis as patient is interested in comfort and continues hemoptysis is distressing to her Hold Eliquis for tonight Continue to monitor

## 2021-07-30 NOTE — Assessment & Plan Note (Signed)
Continue Biktar[facility] Lab Results  Component Value Date   HIV1RNAQUANT <20 (H) 06/06/2021   Lab Results  Component Value Date   CD4TABS 481 02/19/2021   CD4TABS 714 12/01/2019   CD4TABS 470 11/21/2019

## 2021-07-30 NOTE — ED Triage Notes (Signed)
Pt arrived via [facility] from [facility]. Per EMS, NH staff called d/t pt coughing up blood and although it has been going on for a year the only acute difference is that it "is darker than usual." Hx lung cancer. Pt is bedbound d/t CVA. Pt caox4 in no obvious distress on arrival to ED. Pt on O2 @ 3lpm which is her baseline and SpO2 was WNL.   BP 102/58 HR 88 RR 20 SpO2 96% 3L

## 2021-07-30 NOTE — Subjective & Objective (Signed)
Hx of Lung CA with mets to brain Known hemoptysis that has progressed  She has been coughing up blood on and off for about a year.  At baseline on 3L of oxygen Has had decreased PO intake Pt is on chronic eliquis

## 2021-07-30 NOTE — Assessment & Plan Note (Signed)
Mild appears to be positional musculoskeletal.  Continue home medications May try to see if some IV Robaxin may help supportive management

## 2021-07-30 NOTE — Assessment & Plan Note (Signed)
Chronic follow-up as an outpatient

## 2021-07-30 NOTE — Assessment & Plan Note (Signed)
Allow permissive hypertension

## 2021-07-31 ENCOUNTER — Encounter (HOSPITAL_COMMUNITY): Payer: Self-pay | Admitting: Internal Medicine

## 2021-07-31 DIAGNOSIS — J9611 Chronic respiratory failure with hypoxia: Secondary | ICD-10-CM

## 2021-07-31 DIAGNOSIS — Z711 Person with feared health complaint in whom no diagnosis is made: Secondary | ICD-10-CM

## 2021-07-31 DIAGNOSIS — R042 Hemoptysis: Secondary | ICD-10-CM | POA: Diagnosis not present

## 2021-07-31 DIAGNOSIS — Z7901 Long term (current) use of anticoagulants: Secondary | ICD-10-CM

## 2021-07-31 DIAGNOSIS — K5900 Constipation, unspecified: Secondary | ICD-10-CM | POA: Diagnosis not present

## 2021-07-31 DIAGNOSIS — Z7189 Other specified counseling: Secondary | ICD-10-CM

## 2021-07-31 DIAGNOSIS — Z515 Encounter for palliative care: Secondary | ICD-10-CM

## 2021-07-31 DIAGNOSIS — Z66 Do not resuscitate: Secondary | ICD-10-CM

## 2021-07-31 DIAGNOSIS — Z789 Other specified health status: Secondary | ICD-10-CM

## 2021-07-31 DIAGNOSIS — N179 Acute kidney failure, unspecified: Secondary | ICD-10-CM | POA: Diagnosis not present

## 2021-07-31 DIAGNOSIS — N1831 Chronic kidney disease, stage 3a: Secondary | ICD-10-CM | POA: Diagnosis not present

## 2021-07-31 LAB — HEPATIC FUNCTION PANEL
ALT: 10 U/L (ref 0–44)
AST: 13 U/L — ABNORMAL LOW (ref 15–41)
Albumin: 3.3 g/dL — ABNORMAL LOW (ref 3.5–5.0)
Alkaline Phosphatase: 53 U/L (ref 38–126)
Bilirubin, Direct: <0.1 mg/dL (ref 0.0–0.2)
Total Bilirubin: 0.3 mg/dL (ref 0.3–1.2)
Total Protein: 8.1 g/dL (ref 6.5–8.1)

## 2021-07-31 LAB — RETICULOCYTES
Immature Retic Fract: 26.9 % — ABNORMAL HIGH (ref 2.3–15.9)
RBC.: 3.54 MIL/uL — ABNORMAL LOW (ref 3.87–5.11)
Retic Count, Absolute: 49.9 10*3/uL (ref 19.0–186.0)
Retic Ct Pct: 1.4 % (ref 0.4–3.1)

## 2021-07-31 LAB — CBC WITH DIFFERENTIAL/PLATELET
Abs Immature Granulocytes: 0.01 10*3/uL (ref 0.00–0.07)
Basophils Absolute: 0 10*3/uL (ref 0.0–0.1)
Basophils Relative: 0 %
Eosinophils Absolute: 0.1 10*3/uL (ref 0.0–0.5)
Eosinophils Relative: 2 %
HCT: 30.9 % — ABNORMAL LOW (ref 36.0–46.0)
Hemoglobin: 9.1 g/dL — ABNORMAL LOW (ref 12.0–15.0)
Immature Granulocytes: 0 %
Lymphocytes Relative: 27 %
Lymphs Abs: 2 10*3/uL (ref 0.7–4.0)
MCH: 25.9 pg — ABNORMAL LOW (ref 26.0–34.0)
MCHC: 29.4 g/dL — ABNORMAL LOW (ref 30.0–36.0)
MCV: 87.8 fL (ref 80.0–100.0)
Monocytes Absolute: 0.5 10*3/uL (ref 0.1–1.0)
Monocytes Relative: 6 %
Neutro Abs: 4.8 10*3/uL (ref 1.7–7.7)
Neutrophils Relative %: 65 %
Platelets: 241 10*3/uL (ref 150–400)
RBC: 3.52 MIL/uL — ABNORMAL LOW (ref 3.87–5.11)
RDW: 16.8 % — ABNORMAL HIGH (ref 11.5–15.5)
WBC: 7.4 10*3/uL (ref 4.0–10.5)
nRBC: 0 % (ref 0.0–0.2)

## 2021-07-31 LAB — COMPREHENSIVE METABOLIC PANEL
ALT: 10 U/L (ref 0–44)
AST: 12 U/L — ABNORMAL LOW (ref 15–41)
Albumin: 3.3 g/dL — ABNORMAL LOW (ref 3.5–5.0)
Alkaline Phosphatase: 51 U/L (ref 38–126)
Anion gap: 9 (ref 5–15)
BUN: 18 mg/dL (ref 8–23)
CO2: 28 mmol/L (ref 22–32)
Calcium: 8.9 mg/dL (ref 8.9–10.3)
Chloride: 105 mmol/L (ref 98–111)
Creatinine, Ser: 1.14 mg/dL — ABNORMAL HIGH (ref 0.44–1.00)
GFR, Estimated: 51 mL/min — ABNORMAL LOW (ref >60)
Glucose, Bld: 96 mg/dL (ref 70–99)
Potassium: 3.8 mmol/L (ref 3.5–5.1)
Sodium: 142 mmol/L (ref 135–145)
Total Bilirubin: 0.4 mg/dL (ref 0.3–1.2)
Total Protein: 7.8 g/dL (ref 6.5–8.1)

## 2021-07-31 LAB — PHOSPHORUS: Phosphorus: 3.4 mg/dL (ref 2.5–4.6)

## 2021-07-31 LAB — OSMOLALITY, URINE: Osmolality, Ur: 490 mOsm/kg (ref 300–900)

## 2021-07-31 LAB — IRON AND TIBC
Iron: 42 ug/dL (ref 28–170)
Saturation Ratios: 11 % (ref 10.4–31.8)
TIBC: 385 ug/dL (ref 250–450)
UIBC: 343 ug/dL

## 2021-07-31 LAB — TSH: TSH: 1.948 u[IU]/mL (ref 0.350–4.500)

## 2021-07-31 LAB — MAGNESIUM: Magnesium: 2.1 mg/dL (ref 1.7–2.4)

## 2021-07-31 LAB — FERRITIN: Ferritin: 23 ng/mL (ref 11–307)

## 2021-07-31 LAB — VITAMIN B12: Vitamin B-12: 487 pg/mL (ref 180–914)

## 2021-07-31 LAB — FOLATE: Folate: 17 ng/mL (ref >5.9)

## 2021-07-31 MED ORDER — POLYETHYLENE GLYCOL 3350 17 G PO PACK
17.0000 g | PACK | Freq: Two times a day (BID) | ORAL | Status: DC
  Administered 2021-08-01: 17 g via ORAL
  Filled 2021-07-31: qty 1

## 2021-07-31 MED ORDER — SENNOSIDES-DOCUSATE SODIUM 8.6-50 MG PO TABS: 2.0000 | ORAL_TABLET | Freq: Every day | ORAL | Status: DC

## 2021-07-31 MED ORDER — BISACODYL 10 MG RE SUPP: 10.0000 mg | Freq: Every day | RECTAL | Status: DC | PRN

## 2021-07-31 NOTE — Assessment & Plan Note (Addendum)
at baseline.

## 2021-07-31 NOTE — Assessment & Plan Note (Signed)
Appears stable-continue Effexor, Seroquel and BuSpar.

## 2021-07-31 NOTE — Consult Note (Signed)
NAME:  [name], MRN:  [id number], DOB:  [birth year], LOS: 0 ADMISSION DATE:  07/30/2021, CONSULTATION DATE:  07/31/2021 REFERRING MD:  Dr. 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  Patient has known history of hemoptysis x1 year but reports episodes have increased in frequency.  Denies any other acute complaints including chest pain or shortness of breath.  Patient was admitted per hospitalist service for ongoing goals of care discussion per palliative.  CT on admission consistent with progressive lung cancer. Given continued hemoptysis pulmonary consult was also placed.  Pertinent  Medical History  Metastatic left upper lobe adenocarcinoma with brain lesions, COPD HIV Hepatitis B Hyperlipidemia Prior parasitic infection Prior stroke  Significant Hospital Events: Including procedures, antibiotic start and stop dates in addition to other pertinent events   2/2 admitted for worsening hemoptysis and concerns for dehydration.  Pulmonary consulted  Interim History / Subjective:  Seen lying in bed with no acute complaints  Report hemoptysis appears to worsen after she eats or drinks due to increased coughing   Objective   Blood pressure 122/74, pulse 80, temperature 97.9 F (36.6 C), temperature source Oral, resp. rate 16, SpO2 96 %.       No intake or output data in the 24 hours ending 07/31/21 0943 There were no vitals filed for this visit.  Examination: General: Acute on chronically ill appearing elderly female lying in bed, in NAD HEENT: [place]/AT, MM pink/moist, PERRL,  Neuro: Alert and oriented x2 with some  ,ild underlying confusion to situation  CV: s1s2 regular rate and rhythm, no murmur, rubs, or gallops,  PULM:  Slightly diminished bilaterally, no increased work of breathing, no added breath sounds GI: soft, bowel sounds active in all 4 quadrants, non-tender, non-distended, tolerating oral diet  Extremities: warm/dry, no edema  Skin: no rashes or lesions  Resolved Hospital Problem list     Assessment & Plan:  Metastatic left lung adenocarcinoma with mets to the brain -CT chest on admission consistent with progressive lung cancer History of COPD History of pulmonary embolism anticoagulated on Eliquis Hemoptysis P: Palliative care following, appreciate assistance No reported hemoptysis since ED arrival Continue to monitor for worsening hemoptysis  Encourage pulmonary hygiene Supportive care Mobilize as able  Can consider SLP eval    No further pulmonary needs identified at this time PCCM will sign off. Thank you for the opportunity to participate in this patient's care. Please contact if we can be of further assistance.  Best Practice (right click and "Reselect all SmartList Selections" daily)  Per primary   Labs   CBC: Recent Labs  Lab 07/30/21 1805 07/31/21 0422  WBC 6.9 7.4  NEUTROABS 4.0 4.8  HGB 9.2* 9.1*  HCT 32.4* 30.9*  MCV 89.0 87.8  PLT 260 885    Basic Metabolic Panel: Recent Labs  Lab 07/30/21 1805 07/31/21 0422  NA 140 142  K 4.0 3.8  CL 105 105  CO2 29 28  GLUCOSE 97 96  BUN 21 18  CREATININE 1.34* 1.14*  CALCIUM 9.0 8.9  MG  --  2.1  PHOS  --  3.4   GFR: CrCl cannot be calculated (Unknown ideal weight.). Recent  Labs  Lab 07/30/21 1805 07/31/21 0422  WBC 6.9 7.4    Liver Function Tests: Recent Labs  Lab 07/31/21 0422  AST 13*   12*  ALT 10   10  ALKPHOS 53   51  BILITOT 0.3   0.4  PROT 8.1   7.8  ALBUMIN 3.3*   3.3*   No results for input(s): LIPASE, AMYLASE in the last 168 hours. No results for input(s): AMMONIA in the  last 168 hours.  ABG    Component Value Date/Time   PHART 7.363 01/04/2021 0000   PCO2ART 47.8 01/04/2021 0000   PO2ART 63.0 (L) 01/04/2021 0000   HCO3 26.7 01/04/2021 0000   TCO2 30 01/02/2020 0207   TCO2 30 01/02/2020 0207   ACIDBASEDEF 1.0 11/21/2019 2016   O2SAT 90.3 01/04/2021 0000     Coagulation Profile: No results for input(s): INR, PROTIME in the last 168 hours.  Cardiac Enzymes: No results for input(s): CKTOTAL, CKMB, CKMBINDEX, TROPONINI in the last 168 hours.  HbA1C: Hgb A1c MFr Bld  Date/Time Value Ref Range Status  03/03/2019 05:00 AM 6.4 (H) 4.8 - 5.6 % Final    Comment:    (NOTE) Pre diabetes:          5.7%-6.4% Diabetes:              >6.4% Glycemic control for   <7.0% adults with diabetes   03/02/2019 05:25 AM 6.3 (H) 4.8 - 5.6 % Final    Comment:    (NOTE) Pre diabetes:          5.7%-6.4% Diabetes:              >6.4% Glycemic control for   <7.0% adults with diabetes     CBG: No results for input(s): GLUCAP in the last 168 hours.  Review of Systems:   Please see the history of present illness. All other systems reviewed and are negative   Past Medical History:  She,  has a past medical history of Acute cystitis without hematuria, Acute encephalopathy (02/18/2021), Asthma, Atypical squamous cells of undetermined significance (ASCUS) on Papanicolaou smear of cervix, Bed bug bite (07/12/2019), Cancer ([place]), COPD (chronic obstructive pulmonary disease) ([place]), CVA (cerebral vascular accident) ([place]) (03/02/2019), H/O drainage of abscess, Hep B w/o coma, HIV (human immunodeficiency virus infection) ([place]), HIV (human immunodeficiency virus infection) ([place]) (01/02/2020), Hyperlipidemia, Lung cancer ([place]) (12/14/13), Neuromuscular disorder ([place]), Non-small cell lung cancer ([place]), Parasite infection (06/06/2021), Parasite infection (06/06/2021), Renal disorder, S/P radiation therapy (01/26/14-02/02/14), Splenic infarct, and Stroke ([place]).   Surgical History:   Past  Surgical History:  Procedure Laterality Date   ABCESS DRAINAGE     abcess under Left arm, abcess from L breast   APPLICATION OF CRANIAL NAVIGATION Right 11/28/2019   Procedure: APPLICATION OF CRANIAL NAVIGATION;  Surgeon: [name], MD;  Location: [facility];  Service: Neurosurgery;  Laterality: Right;   BRAIN SURGERY     BUBBLE STUDY  03/08/2019   Procedure: BUBBLE STUDY;  Surgeon: [name], MD;  Location: [facility];  Service: Cardiovascular;;   CRANIOTOMY Right 11/28/2019   Procedure: Right Craniotomy for Tumor Resection;  Surgeon: [name], MD;  Location: [facility];  Service: Neurosurgery;  Laterality: Right;   CRANIOTOMY     removal of abnormal cells on uterus     TEE WITHOUT CARDIOVERSION N/A 03/08/2019   Procedure: TRANSESOPHAGEAL ECHOCARDIOGRAM (TEE);  Surgeon: [name], MD;  [facility] ENDOSCOPY;  Service: Cardiovascular;  Laterality: N/A;   thyroid gland  removed       Social History:   reports that she has never smoked. She has never used smokeless tobacco. She reports that she does not currently use alcohol. She reports that she does not currently use drugs.   Family History:  Her family history includes Cancer in her brother, brother, and sister; Diabetes in her sister; Hypertension in her sister; Pneumonia in her mother.   Allergies No Known Allergies   Home Medications  Prior to Admission medications   Medication Sig Start Date End Date Taking? Authorizing Provider  albuterol (VENTOLIN HFA) 108 (90 Base) MCG/ACT inhaler Inhale 1 puff into the lungs every 6 (six) hours as needed for shortness of breath.  12/23/19  Yes [provider]  ammonium lactate (LAC-HYDRIN) 12 % lotion Apply 1 application topically in the morning and at bedtime.   Yes [provider]  apixaban (ELIQUIS) 5 MG TABS tablet Take 1 tablet (5 mg total) by mouth 2 (two) times daily. 03/13/19  Yes Hosie Poisson, MD  bictegravir-emtricitabine-tenofovir AF (BIKTARVY)  50-200-25 MG TABS tablet Take 1 tablet by mouth daily. 07/12/19  Yes Tommy Medal, Lavell Islam, MD  busPIRone (BUSPAR) 5 MG tablet Take 2.5 mg by mouth 3 (three) times daily.   Yes [provider]  carboxymethylcellulose (ARTIFICIAL TEARS) 1 % ophthalmic solution Place 1 drop into both eyes every 12 (twelve) hours.   Yes [provider]  Fluticasone-Salmeterol (ADVAIR) 500-50 MCG/DOSE AEPB Inhale 1 puff into the lungs every 12 (twelve) hours. 09/04/19  Yes [provider]  gabapentin (NEURONTIN) 800 MG tablet Take 800 mg by mouth 3 (three) times daily. 10/26/19  Yes [provider]  Melatonin 3 MG CAPS Take 3 mg by mouth at bedtime.   Yes [provider]  montelukast (SINGULAIR) 10 MG tablet Take 10 mg by mouth at bedtime.  09/04/19  Yes [provider]  oxycodone (OXY-IR) 5 MG capsule Take 5 mg by mouth 3 (three) times daily.   Yes [provider]  OXYGEN Inhale 3 L into the lungs continuous.   Yes [provider]  polyethylene glycol (MIRALAX / GLYCOLAX) 17 g packet Take 17 g by mouth 2 (two) times daily. Mix in 4-8 oz liquid and drink 05/20/21  Yes Plunkett, Loree Fee, MD  QUEtiapine Fumarate (SEROQUEL XR) 150 MG 24 hr tablet Take 150 mg by mouth at bedtime. 11/09/19  Yes [provider]  rOPINIRole (REQUIP) 1 MG tablet Take 1 tablet (1 mg total) by mouth at bedtime. 05/20/19  Yes Ward, Delice Bison, DO  sennosides-docusate sodium (SENOKOT-S) 8.6-50 MG tablet Take 1 tablet by mouth at bedtime.   Yes [provider]  simethicone (MYLICON) 703 MG chewable tablet Chew 125 mg by mouth in the morning and at bedtime. May also chew an additional 125mg  every 6 hours as needed for bloating/gas   Yes [provider]  Skin Protectants, Misc. (EUCERIN) cream Apply 1 application topically daily. To entire body   Yes [provider]  SPIRIVA RESPIMAT 2.5 MCG/ACT AERS INHALE 2 PUFFS INTO THE LUNGS DAILY Patient taking  differently: Inhale 2 puffs into the lungs daily. 09/24/14  Yes Rigoberto Noel, MD  tiZANidine (ZANAFLEX) 4 MG tablet Take 4 mg by mouth every 12 (twelve) hours as needed (pain). 11/08/19  Yes [provider]  venlafaxine XR (EFFEXOR-XR) 150 MG 24 hr capsule Take 150 mg by mouth daily.   Yes [provider]  vitamin C (ASCORBIC ACID) 500 MG tablet Take 500  mg by mouth daily.   Yes [provider]  doxycycline (VIBRAMYCIN) 100 MG capsule Take 1 capsule (100 mg total) by mouth 2 (two) times daily. Patient not taking: Reported on 07/30/2021 03/12/21   Larene Pickett, PA-C  traMADol (ULTRAM) 50 MG tablet Take 1 tablet (50 mg total) by mouth in the morning, at noon, and at bedtime. Patient not taking: Reported on 07/30/2021 01/09/21   Darliss Cheney, MD     Signature:  Saiya Crist D. Kenton Kingfisher, NP-C [facility] Personal contact information can be found on Amion  07/31/2021, 11:00 AM

## 2021-07-31 NOTE — Assessment & Plan Note (Signed)
Creatinine at baseline-do not think she had AKI on admission.

## 2021-07-31 NOTE — Progress Notes (Signed)
°   07/31/21 1103  TOC ED Mini Assessment  TOC Time spent with patient (minutes): 45  PING Used in TOC Assessment No  Admission or Readmission Diverted No  Barriers to Discharge ED Barriers Resolved;ED Patient Insisting on an Alternate Living Situation/Facility  Barrier interventions RNCM explained process of transferring facilities occurs at current facility  Means of [place] with [place] Date 07/31/21  Contact time 1040  Patient states their goals for this hospitalization and ongoing recovery are: Stop coughing so much   RNCM spoke with pt brother (BJ) and pt regarding process of changing nursing facilities.  RNCM explained that the request to change has to go through currently facility and they can assist with facility to facility transfer.  Pt and brother verbalized understanding and would appreciate TOC notifying [facility] of their wishes.

## 2021-07-31 NOTE — Evaluation (Signed)
Physical Therapy Evaluation and D/C Patient Details Name: [name] MRN: [id number] DOB: [birth year] Today's Date: 07/31/2021  History of Present Illness  Pt is a 76 y/o female presenting on 07/30/21 from SNF with worsening hemoptysis.  CTA, CT head negative.  PMH includes: non small cell lung CA, recurrent VTE, COPD, CVA with L sided hemiparesis, resp failure on 3L [place], PE.  Clinical Impression  Pt admitted with above diagnosis. Pt is total care and was PTA with staff using lift PTA.  Will not benefit from skilled PT as pt is at her baseline. Will sign off.      Recommendations for follow up therapy are one component of a multi-disciplinary discharge planning process, led by the attending physician.  Recommendations may be updated based on patient status, additional functional criteria and insurance authorization.  Follow Up Recommendations Long-term institutional care without follow-up therapy    Assistance Recommended at Discharge Frequent or constant Supervision/Assistance  Patient can return home with the following  Two people to help with walking and/or transfers;Two people to help with bathing/dressing/bathroom;Assistance with cooking/housework;Direct supervision/assist for medications management;Direct supervision/assist for financial management    Equipment Recommendations None recommended by PT  Recommendations for Other Services       Functional Status Assessment Patient has not had a recent decline in their functional status     Precautions / Restrictions Precautions Precautions: Fall Precaution Comments: chronic L hemi Restrictions Weight Bearing Restrictions: No      Mobility  Bed Mobility Overal bed mobility: Needs Assistance Bed Mobility: Supine to Sit, Sit to Supine     Supine to sit: Max assist, +2 for physical assistance, +2 for safety/equipment Sit to supine: Total assist, +2 for physical assistance, +2 for safety/equipment   General bed mobility  comments: Unable to get pt fully to sitting with stiff trunk and she could not sit fully upright. Had to lie pt back down.    Transfers                        Ambulation/Gait                  Stairs            Wheelchair Mobility    Modified Rankin (Stroke Patients Only)       Balance Overall balance assessment: Needs assistance Sitting-balance support: No upper extremity supported, Feet supported Sitting balance-Leahy Scale: Zero Sitting balance - Comments: heavy posterior lean with EOB, max assist for brief sitting EOB Postural control: Posterior lean                                   Pertinent Vitals/Pain Pain Assessment Pain Assessment: Faces Faces Pain Scale: Hurts whole lot Pain Location: left knee Pain Descriptors / Indicators: Discomfort, Grimacing, Guarding Pain Intervention(s): Limited activity within patient's tolerance, Monitored during session, Repositioned    Home Living Family/patient expects to be discharged to:: Skilled nursing facility     Type of Home: [place]             Additional Comments: pt has been residing at [facility] for over 1 year and a half    Prior Function Prior Level of Function : Needs assist             Mobility Comments: was lifted to chair up until 3 months ago, could propel wheelchair, pt states they  quit lifting her because she could only tolerate 3-4 hours in chair ADLs Comments: total assist for bed bath, she says she could try to wash her hair; pt also fed herself     Hand Dominance   Dominant Hand: Right    Extremity/Trunk Assessment   Upper Extremity Assessment Upper Extremity Assessment: LUE deficits/detail LUE Deficits / Details: hx of hemiparesis at baseline    Lower Extremity Assessment Lower Extremity Assessment: RLE deficits/detail;LLE deficits/detail RLE Deficits / Details: grossly 2+/5 LLE Deficits / Details: grossly 2-/5 LLE  Coordination: decreased fine motor;decreased gross motor       Communication   Communication: No difficulties  Cognition Arousal/Alertness: Awake/alert Behavior During Therapy: WFL for tasks assessed/performed Overall Cognitive Status: No family/caregiver present to determine baseline cognitive functioning Area of Impairment: Awareness, Problem solving                           Awareness: Emergent Problem Solving: Slow processing, Requires verbal cues General Comments: anticipate baseline, poor awareness of safety and deficits.        General Comments General comments (skin integrity, edema, etc.): VSS on 3LNC    Exercises     Assessment/Plan    PT Assessment Patient does not need any further PT services  PT Problem List         PT Treatment Interventions      PT Goals (Current goals can be found in the Care Plan section)  Acute Rehab PT Goals Patient Stated Goal: Pt wants to walk again (has not walked in years) PT Goal Formulation: All assessment and education complete, DC therapy    Frequency       Co-evaluation               AM-PAC PT "6 Clicks" Mobility  Outcome Measure Help needed turning from your back to your side while in a flat bed without using bedrails?: Total Help needed moving from lying on your back to sitting on the side of a flat bed without using bedrails?: Total Help needed moving to and from a bed to a chair (including a wheelchair)?: Total Help needed standing up from a chair using your arms (e.g., wheelchair or bedside chair)?: Total Help needed to walk in hospital room?: Total Help needed climbing 3-5 steps with a railing? : Total 6 Click Score: 6    End of Session Equipment Utilized During Treatment: Oxygen Activity Tolerance: Patient limited by fatigue Patient left: with call bell/phone within reach (on stretcher) Nurse Communication: Mobility status;Need for lift equipment PT Visit Diagnosis: Muscle weakness  (generalized) (M62.81)    Time: 4970-2637 PT Time Calculation (min) (ACUTE ONLY): 17 min   Charges:   PT Evaluation $PT Eval Low Complexity: 1 Low          Ramandeep Arington M,PT Acute Rehab Services [phone] (pager)   Alvira Philips 07/31/2021, 2:21 PM

## 2021-07-31 NOTE — Progress Notes (Signed)
This chaplain responded to PMT consult for creating/updating the Pt. Advance Directive:  HCPOA.  The Pt. completed AD education with the chaplain and answered clarifying questions.  The Pt. named [facility] Record as her HCPOA. If the HCPOA is unwilling or unable to serve the Pt. names Jenica Costilow as her next choice.  The chaplain gave the Pt. the original AD and two copies to the Pt. The documents were placed in the Pt. green bag. The chaplain scanned a copy of the Pt. AD into EMR.   The chaplain understands the Pt. is appreciative and feels supported by the Palliative Care's presence. The Pt. speaks positively of her faith and her family in her storytelling.  The Pt. accepted the chaplain's invitation for prayer and F/U spiritual care.  Chaplain Sallyanne Kuster [phone]

## 2021-07-31 NOTE — Assessment & Plan Note (Addendum)
History of craniotomy, resection of right high frontal metastasis (c/w lung adenocarcinoma) on 11/28/2019 and completed post-op frx SRS with Dr. Lisbeth Renshaw 27Gy/72fx on 06/06/2020.  Reviewed last neuro oncology note-given bedbound status-felt to be a poor candidate for aggressive care.

## 2021-07-31 NOTE — Assessment & Plan Note (Signed)
BP stable-not on any antihypertensives.

## 2021-07-31 NOTE — Progress Notes (Addendum)
PROGRESS NOTE        PATIENT DETAILS Name: [name] Age: 76 y.o. Sex: female Date of Birth: 1946/02/06 Admit Date: 07/30/2021 Admitting Physician Toy Baker, MD [id number], Christean Grief, MD  Brief Summary: Patient is a 76 y.o.  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  Significant events: 2/1>> admit to [facility] for worsening hemoptysis  Significant imaging studies: 2/1>> CTA chest: No PE-enlarging LLL mass-possible lymphangitic spread 2/1>> CT head: No acute intracranial process. 2/1>> x-ray abdomen: Large stool burden  Significant microbiology data: 2/1>> flu/COVID PCR: Negative  Procedures: None  Consults:  PCCM, palliative care  Subjective: Lying comfortably in bed-denies any chest pain or shortness of breath.  Objective: Vitals: Blood pressure 100/78, pulse 74, temperature 97.9 F (36.6 C), temperature source Oral, resp. rate 19, SpO2 99 %.   Exam: Gen Exam:Alert awake-not in any distress HEENT:atraumatic, normocephalic Chest: B/L clear to auscultation anteriorly CVS:S1S2 regular Abdomen:soft non tender, non distended Extremities:no edema Neurology: Non focal Skin: no rash  Pertinent Labs/Radiology: CBC Latest Ref Rng & Units 07/31/2021 07/30/2021 06/06/2021  WBC 4.0 - 10.5 K/uL 7.4 6.9 7.6  Hemoglobin 12.0 - 15.0 g/dL 9.1(L) 9.2(L) 9.9(L)  Hematocrit 36.0 - 46.0 % 30.9(L) 32.4(L) 33.3(L)  Platelets 150 - 400 K/uL 241 260 311    Lab Results  Component Value Date   NA 142 07/31/2021   K 3.8 07/31/2021   CL 105 07/31/2021   CO2 28 07/31/2021      Assessment/Plan: * Hemoptysis- (present on admission) Probably due to underlying lung malignancy and the use of anticoagulation.  Eliquis held-await PCCM input.  History of pulmonary embolus (PE)- (present on admission) Hold Eliquis given worsening  hemoptysis.  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 Lisbeth Renshaw 27Gy/59fx on 06/06/2020.  Reviewed last neuro oncology note-given bedbound status-felt to be a poor candidate for aggressive care.  COPD mixed type ([place])- (present on admission) Stable-continue bronchodilators.  Chronic respiratory failure with hypoxia-on 3 L of oxygen at home- (present on admission) at baseline.  HIV (human immunodeficiency virus infection) ([place])- (present on admission) Continue antiretrovirals.  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  Anticoagulation on hold due to worsening hemoptysis.  CK stage IIIa- (present on admission) Creatinine at baseline-do not think she had AKI on admission.  Mood disorder:- (present on admission) Appears stable-continue Effexor, Seroquel and BuSpar.  Constipation- (present on admission) Will place on bowel regimen.  Hypertension- (present on admission) BP stable-not on any antihypertensives.  Obesity: Estimated body mass index is 32.23 kg/m as calculated from the following:   Height as of 03/11/21: 5\' 9"  (1.753 m).   Weight as of 03/11/21: 99 kg.   Code status:   Code Status: DNR   DVT Prophylaxis: SCDs Start: 07/30/21 2217    Procedures: None Consults: Palliative care, PCCM Family Communication: None at bedside   Disposition Plan: Status is: Observation The patient will require care spanning > 2 midnights and should be moved to inpatient because: Holding anticoagulation and monitoring to see if hemoptysis will improve.    Diet: Diet Order  Diet Heart Room service appropriate? Yes; Fluid consistency: Thin  Diet effective now                     Antimicrobial agents: Anti-infectives (From admission, onward)     Start     Dose/Rate Route Frequency Ordered Stop   07/31/21 1000  bictegravir-emtricitabine-tenofovir AF (BIKTARVY) 50-200-25 MG per tablet 1 tablet        1 tablet Oral Daily 07/30/21 2216          MEDICATIONS: Scheduled Meds:  bictegravir-emtricitabine-tenofovir AF  1 tablet Oral Daily   busPIRone  2.5 mg Oral TID   guaiFENesin  600 mg Oral BID   mometasone-formoterol  2 puff Inhalation BID   polyethylene glycol  17 g Oral BID   QUEtiapine Fumarate  150 mg Oral QHS   rOPINIRole  1 mg Oral QHS   senna-docusate  2 tablet Oral QHS   umeclidinium bromide  1 puff Inhalation Daily   venlafaxine XR  150 mg Oral Daily   Continuous Infusions:  methocarbamol (ROBAXIN) IV     PRN Meds:.acetaminophen **OR** acetaminophen, albuterol, bisacodyl, HYDROcodone-acetaminophen, methocarbamol (ROBAXIN) IV, morphine injection, simethicone   I have personally reviewed following labs and imaging studies  LABORATORY DATA: CBC: Recent Labs  Lab 07/30/21 1805 07/31/21 0422  WBC 6.9 7.4  NEUTROABS 4.0 4.8  HGB 9.2* 9.1*  HCT 32.4* 30.9*  MCV 89.0 87.8  PLT 260 761    Basic Metabolic Panel: Recent Labs  Lab 07/30/21 1805 07/31/21 0422  NA 140 142  K 4.0 3.8  CL 105 105  CO2 29 28  GLUCOSE 97 96  BUN 21 18  CREATININE 1.34* 1.14*  CALCIUM 9.0 8.9  MG  --  2.1  PHOS  --  3.4    GFR: CrCl cannot be calculated (Unknown ideal weight.).  Liver Function Tests: Recent Labs  Lab 07/31/21 0422  AST 13*   12*  ALT 10   10  ALKPHOS 53   51  BILITOT 0.3   0.4  PROT 8.1   7.8  ALBUMIN 3.3*   3.3*   No results for input(s): LIPASE, AMYLASE in the last 168 hours. No results for input(s): AMMONIA in the last 168 hours.  Coagulation Profile: No results for input(s): INR, PROTIME in the last 168 hours.  Cardiac Enzymes: No results for input(s): CKTOTAL, CKMB, CKMBINDEX, TROPONINI in the last 168 hours.  BNP (last 3 results) No results for input(s): PROBNP in the last 8760  hours.  Lipid Profile: No results for input(s): CHOL, HDL, LDLCALC, TRIG, CHOLHDL, LDLDIRECT in the last 72 hours.  Thyroid Function Tests: Recent Labs    07/31/21 0422  TSH 1.948    Anemia Panel: Recent Labs    07/31/21 0422 07/31/21 0423 07/31/21 0424  VITAMINB12 487  --   --   FOLATE  --  17.0  --   FERRITIN 23  --   --   TIBC 385  --   --   IRON 42  --   --   RETICCTPCT  --   --  1.4    Urine analysis:    Component Value Date/Time   COLORURINE YELLOW 07/30/2021 2117   APPEARANCEUR TURBID (A) 07/30/2021 2117   LABSPEC 1.020 07/30/2021 2117   PHURINE 6.0 07/30/2021 2117   GLUCOSEU NEGATIVE 07/30/2021 2117   HGBUR SMALL (A) 07/30/2021 2117   BILIRUBINUR NEGATIVE 07/30/2021 2117   KETONESUR NEGATIVE 07/30/2021 2117   PROTEINUR NEGATIVE 07/30/2021  2117   UROBILINOGEN 0.2 03/28/2015 1227   NITRITE POSITIVE (A) 07/30/2021 2117   LEUKOCYTESUR LARGE (A) 07/30/2021 2117    Sepsis Labs: Lactic Acid, Venous    Component Value Date/Time   LATICACIDVEN 1.1 02/17/2021 2300    MICROBIOLOGY: Recent Results (from the past 240 hour(s))  Resp Panel by RT-PCR (Flu A&B, Covid) Nasopharyngeal Swab     Status: None   Collection Time: 07/30/21  3:13 PM   Specimen: Nasopharyngeal Swab; Nasopharyngeal(NP) swabs in vial transport medium  Result Value Ref Range Status   SARS Coronavirus 2 by RT PCR NEGATIVE NEGATIVE Final    Comment: (NOTE) SARS-CoV-2 target nucleic acids are NOT DETECTED.  The SARS-CoV-2 RNA is generally detectable in upper respiratory specimens during the acute phase of infection. The lowest concentration of SARS-CoV-2 viral copies this assay can detect is 138 copies/mL. A negative result does not preclude SARS-Cov-2 infection and should not be used as the sole basis for treatment or other patient management decisions. A negative result may occur with  improper specimen collection/handling, submission of specimen other than nasopharyngeal swab, presence of  viral mutation(s) within the areas targeted by this assay, and inadequate number of viral copies(<138 copies/mL). A negative result must be combined with clinical observations, patient history, and epidemiological information. The expected result is Negative.  Fact Sheet for Patients:  EntrepreneurPulse.com.au  Fact Sheet for Healthcare Providers:  IncredibleEmployment.be  This test is no t yet approved or cleared by the [place] FDA and  has been authorized for detection and/or diagnosis of SARS-CoV-2 by FDA under an Emergency Use Authorization (EUA). This EUA will remain  in effect (meaning this test can be used) for the duration of the COVID-19 declaration under Section 564(b)(1) of the Act, 21 U.S.C.section 360bbb-3(b)(1), unless the authorization is terminated  or revoked sooner.       Influenza A by PCR NEGATIVE NEGATIVE Final   Influenza B by PCR NEGATIVE NEGATIVE Final    Comment: (NOTE) The Xpert Xpress SARS-CoV-2/FLU/RSV plus assay is intended as an aid in the diagnosis of influenza from Nasopharyngeal swab specimens and should not be used as a sole basis for treatment. Nasal washings and aspirates are unacceptable for Xpert Xpress SARS-CoV-2/FLU/RSV testing.  Fact Sheet for Patients: EntrepreneurPulse.com.au  Fact Sheet for Healthcare Providers: IncredibleEmployment.be  This test is not yet approved or cleared by the [place] FDA and has been authorized for detection and/or diagnosis of SARS-CoV-2 by FDA under an Emergency Use Authorization (EUA). This EUA will remain in effect (meaning this test can be used) for the duration of the COVID-19 declaration under Section 564(b)(1) of the Act, 21 U.S.C. section 360bbb-3(b)(1), unless the authorization is terminated or revoked.  Performed at [facility], [place] [address] [place], [place] [address]     RADIOLOGY  STUDIES/RESULTS: DG Chest 1 View  Result Date: 07/30/2021 CLINICAL DATA:  Cough, hemoptysis EXAM: CHEST  1 VIEW COMPARISON:  05/20/2021 FINDINGS: Bilateral interstitial thickening. Left perihilar band like area of airspace disease. No pleural effusion or pneumothorax. Stable cardiomediastinal silhouette. No aggressive osseous lesion. IMPRESSION: 1. Left perihilar airspace disease concerning for pneumonia versus post treatment changes. The pre see demonstrated mass in the superior segment of the left lower lobe in this area is not well visualized. If there is further clinical concern, recommend a CT of the chest with intravenous contrast. Electronically Signed   By: Kathreen Devoid M.D.   On: 07/30/2021 13:54   DG Abd 1 View  Result  Date: 07/30/2021 CLINICAL DATA:  Constipation. EXAM: ABDOMEN - 1 VIEW COMPARISON:  Abdominal x-ray 01/11/2012. FINDINGS: Bowel-gas pattern is nonobstructive. There is a large amount of stool throughout the entire colon and within the rectum. Contrast is seen in the bladder. No suspicious calcifications are identified. There are no acute fractures. Lung bases are clear. IMPRESSION: 1. Nonobstructive bowel gas pattern. 2. Large amount of stool throughout the colon and within the rectum. Electronically Signed   By: Ronney Asters M.D.   On: 07/30/2021 22:11   CT HEAD WO CONTRAST (5MM)  Result Date: 07/30/2021 CLINICAL DATA:  History of CNS neoplasm, hemoptysis EXAM: CT HEAD WITHOUT CONTRAST TECHNIQUE: Contiguous axial images were obtained from the base of the skull through the vertex without intravenous contrast. RADIATION DOSE REDUCTION: This exam was performed according to the departmental dose-optimization program which includes automated exposure control, adjustment of the mA and/or kV according to patient size and/or use of iterative reconstruction technique. COMPARISON:  02/18/2021 CT, correlation is also made with MRI head 03/25/2021 FINDINGS: Brain: Prior right frontoparietal  craniotomy for tumor resection. Overall unchanged hypodensity in the right frontoparietal and posterior temporal lobes compared to 02/18/2021. Evaluation for mass is limited in the absence of intravenous contrast. No acute hemorrhage or midline shift. No extra-axial collection or hydrocephalus. Vascular: No hyperdense vessel. Skull: Right frontoparietal craniotomy. No acute fracture or focal lesion. Sinuses/Orbits: The sinuses are clear. Prior left orbital inferior rim fracture. Other: The mastoids are well aerated. IMPRESSION: 1. No acute intracranial process. 2. Prior right frontoparietal craniotomy for tumor resection, with overall unchanged edema in the right frontal, parietal, and temporal lobes. Electronically Signed   By: Merilyn Baba M.D.   On: 07/30/2021 20:25   CT Angio Chest PE W and/or Wo Contrast  Result Date: 07/30/2021 CLINICAL DATA:  Cough, hemoptysis, history of metastatic lung cancer EXAM: CT ANGIOGRAPHY CHEST WITH CONTRAST TECHNIQUE: Multidetector CT imaging of the chest was performed using the standard protocol during bolus administration of intravenous contrast. Multiplanar CT image reconstructions and MIPs were obtained to evaluate the vascular anatomy. RADIATION DOSE REDUCTION: This exam was performed according to the departmental dose-optimization program which includes automated exposure control, adjustment of the mA and/or kV according to patient size and/or use of iterative reconstruction technique. CONTRAST:  50mL OMNIPAQUE IOHEXOL 350 MG/ML SOLN COMPARISON:  03/12/2021, 07/30/2021 FINDINGS: Cardiovascular: This is a technically adequate evaluation of the pulmonary vasculature. No filling defects or pulmonary emboli. The heart is unremarkable without pericardial effusion. 4 cm ascending thoracic aortic aneurysm again noted. No evidence of dissection. Mild atherosclerosis. Mediastinum/Nodes: Subcentimeter pretracheal and AP window adenopathy unchanged. No pathologically enlarged lymph  nodes. Thyroid, trachea, and esophagus are stable. Lungs/Pleura: The spiculated left perihilar mass within the superior segment left lower lobe now measures 4.9 x 4.1 cm reference image 51/7, previously having measured 3.6 x 3.2 cm. There is increasing surrounding nodular interlobular septal thickening concerning for lymphangitic spread of disease. Increasing opacification of the left lower lobe bronchus, with likely postobstructive changes within the basilar segments of the left lower lobe. Stable 5 mm left upper lobe nodule reference image 26/7. Stable emphysema. 1.2 x 1.0 cm right lower lobe nodule is seen in the right perihilar region image 66/7, increased since prior study where this had measured 0.9 x 0.7 cm. No effusion or pneumothorax. Upper Abdomen: No acute abnormality. Musculoskeletal: Remote posttraumatic changes of the left lateral fifth and 6 ribs, stable. No acute bony abnormalities. Reconstructed images demonstrate no additional findings. Review of the  MIP images confirms the above findings. IMPRESSION: 1. Enlarging left lower lobe mass consistent with progressive lung cancer. Surrounding nodular interlobular septal thickening concerning for lymphangitic spread of disease. Scattered postobstructive changes within the basilar segments of the left lower lobe due to narrowing of the left lower lobe bronchus by the dominant superior segment left lower lobe mass. 2. Enlarging right lower lobe pulmonary nodule consistent with worsening metastases. 3. Stable nonspecific left upper lobe 5 mm pulmonary nodule. 4. No evidence of pulmonary embolus. 5. Stable 4 cm ascending thoracic aortic aneurysm. Recommend annual imaging followup by CTA or MRA. This recommendation follows 2010 ACCF/AHA/AATS/ACR/ASA/SCA/SCAI/SIR/STS/SVM Guidelines for the Diagnosis and Management of Patients with Thoracic Aortic Disease. Circulation. 2010; 121: F007-H219. Aortic aneurysm NOS (ICD10-I71.9) 6. Aortic Atherosclerosis  (ICD10-I70.0) and Emphysema (ICD10-J43.9). Electronically Signed   By: Randa Ngo M.D.   On: 07/30/2021 20:26     LOS: 0 days   Oren Binet, MD  [facility]    To contact the attending provider between 7A-7P or the covering provider during after hours 7P-7A, please log into the web site [email or web address] and access using universal [place] password for that web site. If you do not have the password, please call the hospital operator.  07/31/2021, 2:14 PM

## 2021-07-31 NOTE — Assessment & Plan Note (Addendum)
See above-holding/discontinuing Eliquis.

## 2021-07-31 NOTE — Assessment & Plan Note (Signed)
Continue antiretrovirals.

## 2021-07-31 NOTE — Care Management Obs Status (Signed)
[place] NOTIFICATION   Patient Details  Name: [name] MRN: [id number] Date of Birth: 12-Dec-1945   Medicare Observation Status Notification Given:  Yes    Fuller Mandril, RN 07/31/2021, 10:40 AM

## 2021-07-31 NOTE — Assessment & Plan Note (Signed)
Stable-continue bronchodilators.

## 2021-07-31 NOTE — Evaluation (Signed)
Occupational Therapy Evaluation and Discharge  Patient Details Name: [name] MRN: [id number] DOB: 06/15/1946 Today's Date: 07/31/2021   History of Present Illness Pt is a 76 y/o female presenting on 07/30/21 from SNF with worsening hemoptysis.  CTA, CT head negative.  PMH includes: non small cell lung CA, recurrent VTE, COPD, CVA with L sided hemiparesis, resp failure on 3L [place], PE.   Clinical Impression   PTA patient reports from SNF where she had assist from bed level for bathing,dressing but was able to feed and groom self.  She reports >3 months since she has been up to a wc.  She was admitted for above and is presenting at baseline level for ADLs and mobility.  Requires max +2 to briefly transition to EOB with heavy posterior lean, setup for grooming and max-total assist for bathing/dressing.  Based on performance today, believe pt is at baseline level with no needs for acute level OT at this time.  Recommend return to long term SNF. OT signing off.      Recommendations for follow up therapy are one component of a multi-disciplinary discharge planning process, led by the attending physician.  Recommendations may be updated based on patient status, additional functional criteria and insurance authorization.   Follow Up Recommendations  Long-term institutional care without follow-up therapy    Assistance Recommended at Discharge Frequent or constant Supervision/Assistance  Patient can return home with the following Two people to help with walking and/or transfers;Two people to help with bathing/dressing/bathroom;Assistance with cooking/housework;Direct supervision/assist for medications management;Direct supervision/assist for financial management;Assist for transportation;Help with stairs or ramp for entrance    Functional Status Assessment  Patient has not had a recent decline in their functional status  Equipment Recommendations  None recommended by OT    Recommendations for  Other Services       Precautions / Restrictions Precautions Precautions: Fall Precaution Comments: chronic L hemi Restrictions Weight Bearing Restrictions: No      Mobility Bed Mobility Overal bed mobility: Needs Assistance Bed Mobility: Supine to Sit, Sit to Supine     Supine to sit: Max assist, +2 for physical assistance, +2 for safety/equipment Sit to supine: Total assist, +2 for physical assistance, +2 for safety/equipment        Transfers                          Balance Overall balance assessment: Needs assistance Sitting-balance support: No upper extremity supported, Feet supported Sitting balance-Leahy Scale: Zero Sitting balance - Comments: heavy posterior lean with EOB, max assist for brief sitting EOB Postural control: Posterior lean                                 ADL either performed or assessed with clinical judgement   ADL Overall ADL's : At baseline                                       General ADL Comments: assist for bathing/dressing at bed level at baseline, can feed and groom using R hand     Vision   Vision Assessment?: No apparent visual deficits     Perception     Praxis      Pertinent Vitals/Pain       Hand Dominance Right   Extremity/Trunk Assessment Upper Extremity  Assessment Upper Extremity Assessment: LUE deficits/detail;Generalized weakness LUE Deficits / Details: hx of hemiparesis at baseline   Lower Extremity Assessment Lower Extremity Assessment: Defer to PT evaluation       Communication Communication Communication: No difficulties   Cognition Arousal/Alertness: Awake/alert Behavior During Therapy: WFL for tasks assessed/performed Overall Cognitive Status: No family/caregiver present to determine baseline cognitive functioning Area of Impairment: Awareness, Problem solving                           Awareness: Emergent Problem Solving: Slow processing,  Requires verbal cues General Comments: anticipate baseline, poor awareness of safety and deficits.     General Comments  VSS on 3L [place]    Exercises     Shoulder Instructions      Home Living Family/patient expects to be discharged to:: Skilled nursing facility     Type of Home: [place]                           Additional Comments: pt has been residing at [facility] for over 1 year and a half      Prior Functioning/Environment Prior Level of Function : Needs assist             Mobility Comments: was lifted to chair up until 3 months ago, could propel wheelchair, pt states they quit lifting her because she could only tolerate 3-4 hours in chair ADLs Comments: total assist for bed bath, she says she could try to wash her hair; pt also fed herself        OT Problem List:        OT Treatment/Interventions:      OT Goals(Current goals can be found in the care plan section)    OT Frequency:      Co-evaluation              AM-PAC OT "6 Clicks" Daily Activity     Outcome Measure Help from another person eating meals?: A Little Help from another person taking care of personal grooming?: A Little Help from another person toileting, which includes using toliet, bedpan, or urinal?: Total Help from another person bathing (including washing, rinsing, drying)?: Total Help from another person to put on and taking off regular upper body clothing?: Total Help from another person to put on and taking off regular lower body clothing?: Total 6 Click Score: 10   End of Session Equipment Utilized During Treatment: Oxygen Nurse Communication: Mobility status  Activity Tolerance: Patient tolerated treatment well Patient left: in bed;with call bell/phone within reach  OT Visit Diagnosis: Other abnormalities of gait and mobility (R26.89);Muscle weakness (generalized) (M62.81)                Time: 9937-1696 OT Time Calculation (min): 16  min Charges:  OT General Charges $OT Visit: 1 Visit OT Evaluation $OT Eval Low Complexity: 1 Low  [name], OT Acute Rehabilitation Services Pager [phone] Office [phone]   [name] 07/31/2021, 11:26 AM

## 2021-07-31 NOTE — Hospital Course (Signed)
Patient is a 76 y.o.  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  Significant events: 2/1>> admit to [facility] for worsening hemoptysis  Significant imaging studies: 2/1>> CTA chest: No PE-enlarging LLL mass-possible lymphangitic spread 2/1>> CT head: No acute intracranial process. 2/1>> x-ray abdomen: Large stool burden  Significant microbiology data: 2/1>> flu/COVID PCR: Negative  Procedures: None  Consults:  PCCM, palliative care

## 2021-07-31 NOTE — Progress Notes (Addendum)
[facility] ED 08 [facility]) Hospital Liaison Note   Received request from PMT Provider, Imagene Riches., for hospice services at home after discharge. Chart and patient information under review by [facility] physician. Hospice eligibility approved.   Spoke with BJ Lovick to initiate education related to hospice philosophy, services, and team approach to care. BJ Coulibaly verbalized understanding of information given. Per discussion, the plan is for patient to discharge home via PTAR once cleared to DC.    DME needs discussed. Patient has the following equipment in the home (Purchased privately): N/A Patient requests the following equipment for delivery: N/A  Address verified and is correct in the chart. BJ Ybanez is the family member to contact to arrange time of equipment delivery.    Please send signed and completed DNR home with patient/family. Please provide prescriptions at discharge as needed to ensure ongoing symptom management.    [facility] information and contact numbers given to family & above information shared with TOC.   Please call with any questions/concerns.    Thank you for the opportunity to participate in this patient's care.   Daphene Calamity, MSW [facility] Liaison  [phone]

## 2021-07-31 NOTE — Assessment & Plan Note (Addendum)
Continue bowel regimen with MiraLAX.

## 2021-07-31 NOTE — Assessment & Plan Note (Addendum)
At baseline-left hemiparesis-bed to wheelchair bound.  See above regarding anticoagulation.

## 2021-07-31 NOTE — Assessment & Plan Note (Addendum)
Probably due to underlying lung malignancy and the use of anticoagulation.  Eliquis was held on admission.  Per patient-she now has very scant hemoptysis-and no longer is coughing up chunks/clots of blood.  Patient has had chronic scant hemoptysis at baseline.  Difficult situation-discussed at length with patient-and then with her brother over the phone on 2/3-risk/benefits of stopping anticoagulation discussed-all understand that if we stop anticoagulation-she could have recurrence of VTE-which could be life-threatening.  However continuing anticoagulation-probably will result in worsening hemoptysis.  After extensive discussion-we have elected to stop anticoagulation.  Patient will be discharged back to SNF with hospice/palliative care follow-up.

## 2021-07-31 NOTE — Consult Note (Signed)
Consultation Note Date: 07/31/2021   Patient Name: [name]  DOB: 03/10/1946  MRN: [id number]  Age / Sex: 76 y.o., female  PCP: Michelle Ebbs, MD Referring Physician: Jonetta Osgood, MD  Reason for Consultation: Establishing goals of care, "lunc ca"  HPI/Patient Profile: 76 y.o. female  with past medical history of COPD, HIV, CVA with residual deficit, lung cancer with mets to brain presented to ED on 07/30/21 from [name] after staff were concerned that she was coughing up blood. She has been coughing up blood on and off for about a year; however, the hemoptysis has progressed where it used to be intermittent and is now daily/every few hours with the passage of some clots. CT scan showed right lower lobe pulmonary nodules consistent with likely worsening metastatic disease. Worsening hemoptysis is also concerning for disease progression. Patient was admitted on 07/30/2021 with non-small cell lung cancer, hemoptysis, left lateral abdominal pain, mild AKI.   Of note, patient is being followed by [name] outpatient Palliative Care.  Clinical Assessment and Goals of Care: I have reviewed medical records including EPIC notes, labs, and imaging. Received report from primary RN - no acute concerns.   Went to visit patient at bedside - no family/visitors present. Patient was lying in bed awake, alert, oriented, and able to participate in conversation. No signs or non-verbal gestures of pain or discomfort noted. No respiratory distress, increased work of breathing, or secretions noted. Patient does endorse abdominal pain at times - she requests something to "clean her out" as she feels she might be constipated. Noted she has miralax already ordered BID to start today.  Met with patient  to discuss diagnosis, prognosis, GOC, EOL wishes, disposition, and options.  I introduced Palliative Medicine as  specialized medical care for people living with serious illness. It focuses on providing relief from the symptoms and stress of a serious illness. The goal is to improve quality of life for both the patient and the family.  We discussed a brief life review of the patient as well as functional and nutritional status. She is not married and has no children. Prior to hospitalization, she was living at [name]. She is bedbound at baseline. Patient reports the desire to try and find another facility as she does not like the care she is receiving at [name]. Patient also reports a decreased appetite.   We discussed patient's current illness and what it means in the larger context of patient's on-going co-morbidities. Reviewed the concern of her cancer progression and increased risk of rehospitalization. Reviewed expectation that patient will likely not regain ability to ambulate. Natural disease trajectory and expectations at EOL were discussed. I attempted to elicit values and goals of care important to the patient. The difference between aggressive medical intervention and comfort care was considered in light of the patient's goals of care. Patient is clear she would not want to be rehospitalized once discharged. She has briefly thought about hospice services.  Provided education and counseling at length on  the philosophy and benefits of hospice care. Discussed that it offers a holistic approach to care in the setting of end-stage illness, and is about supporting the patient where they are allowing nature to take it's course. Discussed the hospice team includes RNs, physicians, social workers, and chaplains. They can provide personal care, support for the family, and help keep patient out of the hospital as well as assist with DME needs for home hospice. Education provided on the difference between home vs residential hospice. Patient is agreeable to home hospice at discharge. She  requests to call her brothers.  Spoke with brother/Michelle, sister in law/[name], and brother/Michelle via speakerphone. Reviewed concern for patient's cancer progression and all information as outlined above. Patient updated her family on increased hemoptysis she is experiencing. Reviewed with family her desire for hospice care - answered all of family's questions about hospice care and philosophy. Allowed space for patient to express her thoughts and feelings to family. Patient and family both understand with hospice services, she would no longer receive PT.  Patient expressed desire for transition to hospice to her family - they express support of her decision.   Family inquire about assistance helping find the patient another LTC facility - they are also not happy with [facility]. Family prefer [facility] or [facility]. Informed them I would have TOC call to discuss in further detail.   Advance directives, concepts specific to code status, and rehospitalization were considered and discussed. Patient does not have a Living Will or HCPOA - offered assistance to complete while she is here. Patient would like to name HCPOA #1 brother/[name] and #2 [name]. Chaplain to assist. Patient also confirms DNR/DNI.  Discussed with patient/family the importance of continued conversation with each other and the medical providers regarding overall plan of care and treatment options, ensuring decisions are within the context of the patients values and GOCs.    Questions and concerns were addressed. The patient/family was encouraged to call with questions and/or concerns. PMT card was provided.  Primary Decision Maker: PATIENT    SUMMARY OF RECOMMENDATIONS   Treat the treatable Continue DNR/DNI Patient wants home hospice on discharge. She her goal is not to be rehospitalized  [facility] liaison and TOC notified of patient's goal for home hospice at facility. TOC consult placed. Chaplain consulted for:  HCPOA document completion - #1 brother/Michelle Kabir and #2 brother/[name] PMT will continue to follow peripherally. If there are any imminent needs please call the service directly  Code Status/Advance Care Planning: DNR  Palliative Prophylaxis:  Aspiration, Bowel Regimen, Delirium Protocol, Frequent Pain Assessment, Oral Care, and Turn Reposition  Additional Recommendations (Limitations, Scope, Preferences): Full Scope Treatment and No Tracheostomy  Psycho-social/Spiritual:  Created space and opportunity for patient and family to express thoughts and feelings regarding patient's current medical situation.  Emotional support and therapeutic listening provided.  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 The following aspects are pertinent.  Past Medical History:  Diagnosis Date   Acute cystitis without hematuria    Acute encephalopathy 02/18/2021   Asthma    Atypical squamous cells of undetermined significance (ASCUS) on Papanicolaou smear of cervix    Bed bug bite 07/12/2019   Cancer (HCC)    COPD (chronic obstructive pulmonary disease) (HCC)    CVA (cerebral vascular accident) ([place]) 03/02/2019   H/O drainage of abscess    left axilla, from left breast   Hep B w/o coma    HIV (human immunodeficiency virus infection) ([place])    HIV (human immunodeficiency virus infection) ([place]) 01/02/2020    Hyperlipidemia    Lung cancer ([place]) 12/14/13   LUL Adenocarcinoma   Neuromuscular disorder (HCC)    fingers/feet neuropathy   Non-small cell lung cancer ([place])    Parasite infection 06/06/2021   Parasite infection 06/06/2021   Renal disorder    S/P radiation therapy 01/26/14-02/02/14   sbrt  lt upper lung-54Gy/52f   Splenic infarct    Stroke ([facility]    Social History   Socioeconomic History   Marital status: Single    Spouse name: Not on file   Number of children: Not on file   Years of education: Not on file   Highest education level: Not on file  Occupational History   Not on file  Tobacco Use   Smoking status: Never   Smokeless tobacco: Never   Tobacco comments:    quit date the day [facility] Substance and Sexual Activity   Alcohol use: Not Currently    Comment: none in 20 years   Drug use: Not Currently    Comment: none in 20 years   Sexual activity: Not Currently    Partners: Male    Comment: given condoms  Other Topics Concern   Not on file  Social History Narrative   ** Merged History Encounter **       Social Determinants of Health   Financial Resource Strain: Not on file  Food Insecurity: Not on file  Transportation Needs: Not on file  Physical Activity: Not on file  Stress: Not on file  Social Connections: Not on file   Family History  Problem Relation Age of Onset   Pneumonia Mother    Hypertension Sister    Diabetes Sister    Cancer Sister        pancreatic and lung   Cancer Brother        pancreatic   Cancer Brother        pancreatic   Scheduled Meds:  bictegravir-emtricitabine-tenofovir AF  1 tablet Oral Daily   busPIRone  2.5 mg Oral TID   guaiFENesin  600 mg Oral BID   mometasone-formoterol  2 puff Inhalation BID   QUEtiapine Fumarate  150 mg Oral QHS   rOPINIRole  1 mg Oral QHS   Tiotropium Bromide Monohydrate  2 puff Inhalation Daily   venlafaxine XR  150 mg Oral Daily   Continuous Infusions:   methocarbamol (ROBAXIN) IV     PRN Meds:.acetaminophen **OR** acetaminophen, albuterol, HYDROcodone-acetaminophen, methocarbamol (ROBAXIN) IV, morphine injection, simethicone Medications Prior to Admission:  Prior to Admission medications   Medication Sig Start Date End Date Taking? Authorizing Provider  albuterol (VENTOLIN HFA) 108 (90 Base) MCG/ACT inhaler Inhale 1 puff into the lungs every 6 (six) hours as needed for shortness of breath.  12/23/19  Yes [provider]  ammonium lactate (LAC-HYDRIN)  12 % lotion Apply 1 application topically in the morning and at bedtime.   Yes [provider]  apixaban (ELIQUIS) 5 MG TABS tablet Take 1 tablet (5 mg total) by mouth 2 (two) times daily. 03/13/19  Yes Hosie Poisson, MD  bictegravir-emtricitabine-tenofovir AF (BIKTARVY) 50-200-25 MG TABS tablet Take 1 tablet by mouth daily. 07/12/19  Yes Tommy Medal, Lavell Islam, MD  busPIRone (BUSPAR) 5 MG tablet Take 2.5 mg by mouth 3 (three) times daily.   Yes [provider]  carboxymethylcellulose (ARTIFICIAL TEARS) 1 % ophthalmic solution Place 1 drop into both eyes every 12 (twelve) hours.   Yes [provider]  Fluticasone-Salmeterol (ADVAIR) 500-50 MCG/DOSE AEPB Inhale 1 puff into the lungs every 12 (twelve) hours. 09/04/19  Yes [provider]  gabapentin (NEURONTIN) 800 MG tablet Take 800 mg by mouth 3 (three) times daily. 10/26/19  Yes [provider]  Melatonin 3 MG CAPS Take 3 mg by mouth at bedtime.   Yes [provider]  montelukast (SINGULAIR) 10 MG tablet Take 10 mg by mouth at bedtime.  09/04/19  Yes [provider]  oxycodone (OXY-IR) 5 MG capsule Take 5 mg by mouth 3 (three) times daily.   Yes [provider]  OXYGEN Inhale 3 L into the lungs continuous.   Yes [provider]  polyethylene glycol (MIRALAX / GLYCOLAX) 17 g packet Take 17 g by mouth 2 (two) times daily. Mix in 4-8 oz liquid and drink 05/20/21  Yes  Plunkett, Loree Fee, MD  QUEtiapine Fumarate (SEROQUEL XR) 150 MG 24 hr tablet Take 150 mg by mouth at bedtime. 11/09/19  Yes [provider]  rOPINIRole (REQUIP) 1 MG tablet Take 1 tablet (1 mg total) by mouth at bedtime. 05/20/19  Yes Ward, Delice Bison, DO  sennosides-docusate sodium (SENOKOT-S) 8.6-50 MG tablet Take 1 tablet by mouth at bedtime.   Yes [provider]  simethicone (MYLICON) 161 MG chewable tablet Chew 125 mg by mouth in the morning and at bedtime. May also chew an additional 131m every 6 hours as needed for bloating/gas   Yes [provider]  Skin Protectants, Misc. (EUCERIN) cream Apply 1 application topically daily. To entire body   Yes [provider]  SPIRIVA RESPIMAT 2.5 MCG/ACT AERS INHALE 2 PUFFS INTO THE LUNGS DAILY Patient taking differently: Inhale 2 puffs into the lungs daily. 09/24/14  Yes ARigoberto Noel MD  tiZANidine (ZANAFLEX) 4 MG tablet Take 4 mg by mouth every 12 (twelve) hours as needed (pain). 11/08/19  Yes [provider]  venlafaxine XR (EFFEXOR-XR) 150 MG 24 hr capsule Take 150 mg by mouth daily.   Yes [provider]  vitamin C (ASCORBIC ACID) 500 MG tablet Take 500 mg by mouth daily.   Yes [provider]  doxycycline (VIBRAMYCIN) 100 MG capsule Take 1 capsule (100 mg total) by mouth 2 (two) times daily. Patient not taking: Reported on 07/30/2021 03/12/21   SLarene Pickett PA-C  traMADol (ULTRAM) 50 MG tablet Take 1 tablet (50 mg total) by mouth in the morning, at noon, and at bedtime. Patient not taking: Reported on 07/30/2021 01/09/21   PDarliss Cheney MD   No Known Allergies Review of Systems  Constitutional:  Positive for appetite change. Negative for activity change.  Respiratory:  Positive for cough (hemoptysis). Negative for shortness of breath.   Gastrointestinal:  Positive for abdominal distention and abdominal pain. Negative for nausea and vomiting.  Neurological:  Positive for weakness.    Physical  Exam Vitals and nursing note reviewed.  Constitutional:      General: She is not in acute distress. Pulmonary:     Effort: No respiratory distress.     Comments: Hemoptysis, cough Skin:    General: Skin is warm and dry.  Neurological:     Mental Status: She is alert and oriented to person, place, and time.     Motor: Weakness present.  Psychiatric:        Attention and Perception: Attention normal.        Behavior: Behavior is cooperative.        Cognition and Memory: Cognition and memory normal.    Vital Signs: BP 111/66    Pulse 75    Temp 97.9 F (36.6 C) (Oral)    Resp 15    SpO2 97%          SpO2: SpO2: 97 % O2 Device:SpO2: 97 % O2 Flow Rate: .O2 Flow Rate (L/min): 3 L/min  IO: Intake/output summary: No intake or output data in the 24 hours ending 07/31/21 0841  LBM:   Baseline Weight:   Most recent weight:       Palliative Assessment/Data: PPS 30%     Time In: 0900 Time Out: 1045 Time Total: 105 minutes  Greater than 50%  of this time was spent counseling and coordinating care related to the above assessment and plan.  Signed by: Lin Landsman, NP   Please contact Palliative Medicine Team phone at [phone] for questions and concerns.  For individual provider: See Shea Evans

## 2021-08-01 DIAGNOSIS — J449 Chronic obstructive pulmonary disease, unspecified: Secondary | ICD-10-CM | POA: Diagnosis not present

## 2021-08-01 DIAGNOSIS — Z515 Encounter for palliative care: Secondary | ICD-10-CM

## 2021-08-01 DIAGNOSIS — R042 Hemoptysis: Secondary | ICD-10-CM | POA: Diagnosis not present

## 2021-08-01 DIAGNOSIS — J9611 Chronic respiratory failure with hypoxia: Secondary | ICD-10-CM | POA: Diagnosis not present

## 2021-08-01 DIAGNOSIS — N1831 Chronic kidney disease, stage 3a: Secondary | ICD-10-CM | POA: Diagnosis not present

## 2021-08-01 MED ORDER — SENNA-DOCUSATE SODIUM 8.6-50 MG PO TABS: 2.0000 | ORAL_TABLET | Freq: Every day | ORAL | Status: DC

## 2021-08-01 MED ORDER — BISACODYL 10 MG RE SUPP: 10.0000 mg | RECTAL | 0 refills | Status: DC | PRN

## 2021-08-01 MED ORDER — OXYCODONE HCL 5 MG PO CAPS: 5.0000 mg | ORAL_CAPSULE | Freq: Three times a day (TID) | ORAL | 0 refills | Status: DC

## 2021-08-01 NOTE — TOC Transition Note (Signed)
Transition of Care [facility]) - CM/SW Discharge Note   Patient Details  Name: [name] MRN: [id number] Date of Birth: Mar 12, 1946  Transition of Care [facility] CM/SW Contact:  Benard Halsted, LCSW Phone Number: 08/01/2021, 1:32 PM   Clinical Narrative:    Patient will DC to: [facility] Anticipated DC date: 08/01/21 Family notified: Brother, BJ Transport by: Corey Harold   Per MD patient ready for DC to [facility]. RN to call report prior to discharge [phone]). RN, patient, patient's family, and facility notified of DC. Discharge Summary and FL2 sent to facility. DC packet on chart. Ambulance transport requested for patient.   CSW will sign off for now as social work intervention is no longer needed. Please consult [name] again if new needs arise.     Final next level of care: Skilled Nursing Facility Barriers to Discharge: Barriers Resolved   Patient Goals and CMS Choice Patient states their goals for this hospitalization and ongoing recovery are:: Stop coughing so much CMS Medicare.gov Compare Post Acute Care list provided to:: Patient Choice offered to / list presented to : Sibling, Patient  Discharge Placement   Existing PASRR number confirmed : 08/01/21          Patient chooses bed at: [facility] Patient to be transferred to facility by: [facility] Name of family member notified: BJ Patient and family notified of of transfer: 08/01/21  Discharge Plan and Services In-house Referral: Clinical Social Work, Hospice / [place] Acute Care Choice: [facility], Hospice                               Social Determinants of Health (SDOH) Interventions     Readmission Risk Interventions No flowsheet data found.

## 2021-08-01 NOTE — Discharge Summary (Signed)
PATIENT DETAILS Name: [name] Age: 76 y.o. Sex: female Date of Birth: Nov 07, 1945 MRN: [id number]. Admitting Physician: Toy Baker, MD TOI:[id number], Christean Grief, MD  Admit Date: 07/30/2021 Discharge date: 08/01/2021  Recommendations for Outpatient Follow-up:  Follow up with PCP in 1-2 weeks Please ensure hospice/palliative care follow-up at SNF Please ensure follow-up with primary neuro oncologist-Dr. Mickeal Skinner  Admitted From:  SNF  Disposition: Skilled nursing facility   Discharge Condition: fair  CODE STATUS:   Code Status: DNR   Diet recommendation:  Diet Order             Diet general           Diet Heart Room service appropriate? Yes; Fluid consistency: Thin  Diet effective now                    Brief Summary: Patient is a 76 y.o.  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  Significant events: 2/1>> admit to [facility] for worsening hemoptysis  Significant imaging studies: 2/1>> CTA chest: No PE-enlarging LLL mass-possible lymphangitic spread 2/1>> CT head: No acute intracranial process. 2/1>> x-ray abdomen: Large stool burden  Significant microbiology data: 2/1>> flu/COVID PCR: Negative  Procedures: None  Consults:  PCCM, palliative care  Brief Hospital Course: * Hemoptysis- (present on admission) Probably due to underlying lung malignancy and the use of anticoagulation.  Eliquis was held on admission.  Per patient-she now has very scant hemoptysis-and no longer is coughing up chunks/clots of blood.  Patient has had chronic scant hemoptysis at baseline.  Difficult situation-discussed at length with patient-and then with her brother over the phone on 2/3-risk/benefits of stopping anticoagulation discussed-all understand that if we stop anticoagulation-she could have recurrence of VTE-which could be life-threatening.   However continuing anticoagulation-probably will result in worsening hemoptysis.  After extensive discussion-we have elected to stop anticoagulation.  Patient will be discharged back to SNF with hospice/palliative care follow-up.  History of pulmonary embolus (PE)- (present on admission) See above-holding/discontinuing Eliquis.  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 Lisbeth Renshaw 27Gy/38fx on 06/06/2020.  Reviewed last neuro oncology note-given bedbound status-felt to be a poor candidate for aggressive care.  COPD mixed type ([place])- (present on admission) Stable-continue bronchodilators.  Chronic respiratory failure with hypoxia-on 3 L of oxygen at home- (present on admission) at baseline.  HIV (human immunodeficiency virus infection) ([place])- (present on admission) Continue antiretrovirals.  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  See above regarding anticoagulation.  CK stage IIIa- (present on admission) Creatinine at baseline-do not think she had AKI on admission.  Mood disorder:- (present on admission) Appears stable-continue Effexor, Seroquel and BuSpar.  Constipation- (present on admission) Continue bowel regimen with MiraLAX.  Hypertension- (present on admission) BP stable-not on any antihypertensives.  Palliative care encounter DNR in place-evaluated by palliative care again this hospitalization-patient to resume hospice follow-up on discharge.  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  See above discussion regarding stopping anticoagulation-patient aware that we will allow nature to take its course at this point.  Obesity: Estimated body mass index is 32.23 kg/m as calculated from the  following:   Height as of 03/11/21: 5\' 9"  (1.753 m).   Weight as of  03/11/21: 99 kg.   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   COPD mixed type (HCC)   Chronic respiratory failure with hypoxia-on 3 L of oxygen at home   HIV (human immunodeficiency virus infection) ([place])   History of CVA with residual deficit   CK stage IIIa   Mood disorder:   Constipation   Hypertension   Palliative care encounter   Obesity   Palliative care status   Left lateral abdominal pain   Discharge Instructions:  Activity:  As tolerated with Full fall precautions use walker/cane & assistance as needed   Discharge Instructions     Diet general   Complete by: As directed    Increase activity slowly   Complete by: As directed       Allergies as of 08/01/2021   No Known Allergies      Medication List     STOP taking these medications    doxycycline 100 MG capsule Commonly known as: VIBRAMYCIN   traMADol 50 MG tablet Commonly known as: ULTRAM       TAKE these medications    albuterol 108 (90 Base) MCG/ACT inhaler Commonly known as: VENTOLIN HFA Inhale 1 puff into the lungs every 6 (six) hours as needed for shortness of breath.   ammonium lactate 12 % lotion Commonly known as: LAC-HYDRIN Apply 1 application topically in the morning and at bedtime.   apixaban 5 MG Tabs tablet Commonly known as: ELIQUIS Take 1 tablet (5 mg total) by mouth 2 (two) times daily.   Artificial Tears 1 % ophthalmic solution Generic drug: carboxymethylcellulose Place 1 drop into both eyes every 12 (twelve) hours.   Biktarvy 50-200-25 MG Tabs tablet Generic drug: bictegravir-emtricitabine-tenofovir AF Take 1 tablet by mouth daily.   bisacodyl 10 MG suppository Commonly known as: DULCOLAX Place 1 suppository (10 mg total) rectally as needed for moderate constipation.    busPIRone 5 MG tablet Commonly known as: BUSPAR Take 2.5 mg by mouth 3 (three) times daily.   eucerin cream Apply 1 application topically daily. To entire body   Fluticasone-Salmeterol 500-50 MCG/DOSE Aepb Commonly known as: ADVAIR Inhale 1 puff into the lungs every 12 (twelve) hours.   gabapentin 800 MG tablet Commonly known as: NEURONTIN Take 800 mg by mouth 3 (three) times daily.   Melatonin 3 MG Caps Take 3 mg by mouth at bedtime.   montelukast 10 MG tablet Commonly known as: SINGULAIR Take 10 mg by mouth at bedtime.   oxycodone 5 MG capsule Commonly known as: OXY-IR Take 1 capsule (5 mg total) by mouth 3 (three) times daily.   OXYGEN Inhale 3 L into the lungs continuous.   polyethylene glycol 17 g packet Commonly known as: MIRALAX / GLYCOLAX Take 17 g by mouth 2 (two) times daily. Mix in 4-8 oz liquid and drink   QUEtiapine Fumarate 150 MG 24 hr tablet Commonly known as: SEROQUEL XR Take 150 mg by mouth at bedtime.   rOPINIRole 1 MG tablet Commonly known as: Requip Take 1 tablet (1 mg total) by mouth at bedtime.   sennosides-docusate sodium 8.6-50 MG tablet Commonly known as: SENOKOT-S Take 2 tablets by mouth at bedtime. What changed: how much to take   simethicone 125 MG chewable tablet Commonly known as: MYLICON Chew 341 mg by mouth in the morning and at bedtime. May also chew an additional 125mg  every 6 hours  as needed for bloating/gas   Spiriva Respimat 2.5 MCG/ACT Aers Generic drug: Tiotropium Bromide Monohydrate INHALE 2 PUFFS INTO THE LUNGS DAILY   tiZANidine 4 MG tablet Commonly known as: ZANAFLEX Take 4 mg by mouth every 12 (twelve) hours as needed (pain).   venlafaxine XR 150 MG 24 hr capsule Commonly known as: EFFEXOR-XR Take 150 mg by mouth daily.   vitamin C 500 MG tablet Commonly known as: ASCORBIC ACID Take 500 mg by mouth daily.        No Known Allergies   Other Procedures/Studies: DG Chest 1 View  Result Date:  07/30/2021 CLINICAL DATA:  Cough, hemoptysis EXAM: CHEST  1 VIEW COMPARISON:  05/20/2021 FINDINGS: Bilateral interstitial thickening. Left perihilar band like area of airspace disease. No pleural effusion or pneumothorax. Stable cardiomediastinal silhouette. No aggressive osseous lesion. IMPRESSION: 1. Left perihilar airspace disease concerning for pneumonia versus post treatment changes. The pre see demonstrated mass in the superior segment of the left lower lobe in this area is not well visualized. If there is further clinical concern, recommend a CT of the chest with intravenous contrast. Electronically Signed   By: Kathreen Devoid M.D.   On: 07/30/2021 13:54   DG Abd 1 View  Result Date: 07/30/2021 CLINICAL DATA:  Constipation. EXAM: ABDOMEN - 1 VIEW COMPARISON:  Abdominal x-ray 01/11/2012. FINDINGS: Bowel-gas pattern is nonobstructive. There is a large amount of stool throughout the entire colon and within the rectum. Contrast is seen in the bladder. No suspicious calcifications are identified. There are no acute fractures. Lung bases are clear. IMPRESSION: 1. Nonobstructive bowel gas pattern. 2. Large amount of stool throughout the colon and within the rectum. Electronically Signed   By: Ronney Asters M.D.   On: 07/30/2021 22:11   CT HEAD WO CONTRAST (5MM)  Result Date: 07/30/2021 CLINICAL DATA:  History of CNS neoplasm, hemoptysis EXAM: CT HEAD WITHOUT CONTRAST TECHNIQUE: Contiguous axial images were obtained from the base of the skull through the vertex without intravenous contrast. RADIATION DOSE REDUCTION: This exam was performed according to the departmental dose-optimization program which includes automated exposure control, adjustment of the mA and/or kV according to patient size and/or use of iterative reconstruction technique. COMPARISON:  02/18/2021 CT, correlation is also made with MRI head 03/25/2021 FINDINGS: Brain: Prior right frontoparietal craniotomy for tumor resection. Overall unchanged  hypodensity in the right frontoparietal and posterior temporal lobes compared to 02/18/2021. Evaluation for mass is limited in the absence of intravenous contrast. No acute hemorrhage or midline shift. No extra-axial collection or hydrocephalus. Vascular: No hyperdense vessel. Skull: Right frontoparietal craniotomy. No acute fracture or focal lesion. Sinuses/Orbits: The sinuses are clear. Prior left orbital inferior rim fracture. Other: The mastoids are well aerated. IMPRESSION: 1. No acute intracranial process. 2. Prior right frontoparietal craniotomy for tumor resection, with overall unchanged edema in the right frontal, parietal, and temporal lobes. Electronically Signed   By: Merilyn Baba M.D.   On: 07/30/2021 20:25   CT Angio Chest PE W and/or Wo Contrast  Result Date: 07/30/2021 CLINICAL DATA:  Cough, hemoptysis, history of metastatic lung cancer EXAM: CT ANGIOGRAPHY CHEST WITH CONTRAST TECHNIQUE: Multidetector CT imaging of the chest was performed using the standard protocol during bolus administration of intravenous contrast. Multiplanar CT image reconstructions and MIPs were obtained to evaluate the vascular anatomy. RADIATION DOSE REDUCTION: This exam was performed according to the departmental dose-optimization program which includes automated exposure control, adjustment of the mA and/or kV according to patient size and/or use of iterative  reconstruction technique. CONTRAST:  50mL OMNIPAQUE IOHEXOL 350 MG/ML SOLN COMPARISON:  03/12/2021, 07/30/2021 FINDINGS: Cardiovascular: This is a technically adequate evaluation of the pulmonary vasculature. No filling defects or pulmonary emboli. The heart is unremarkable without pericardial effusion. 4 cm ascending thoracic aortic aneurysm again noted. No evidence of dissection. Mild atherosclerosis. Mediastinum/Nodes: Subcentimeter pretracheal and AP window adenopathy unchanged. No pathologically enlarged lymph nodes. Thyroid, trachea, and esophagus are stable.  Lungs/Pleura: The spiculated left perihilar mass within the superior segment left lower lobe now measures 4.9 x 4.1 cm reference image 51/7, previously having measured 3.6 x 3.2 cm. There is increasing surrounding nodular interlobular septal thickening concerning for lymphangitic spread of disease. Increasing opacification of the left lower lobe bronchus, with likely postobstructive changes within the basilar segments of the left lower lobe. Stable 5 mm left upper lobe nodule reference image 26/7. Stable emphysema. 1.2 x 1.0 cm right lower lobe nodule is seen in the right perihilar region image 66/7, increased since prior study where this had measured 0.9 x 0.7 cm. No effusion or pneumothorax. Upper Abdomen: No acute abnormality. Musculoskeletal: Remote posttraumatic changes of the left lateral fifth and 6 ribs, stable. No acute bony abnormalities. Reconstructed images demonstrate no additional findings. Review of the MIP images confirms the above findings. IMPRESSION: 1. Enlarging left lower lobe mass consistent with progressive lung cancer. Surrounding nodular interlobular septal thickening concerning for lymphangitic spread of disease. Scattered postobstructive changes within the basilar segments of the left lower lobe due to narrowing of the left lower lobe bronchus by the dominant superior segment left lower lobe mass. 2. Enlarging right lower lobe pulmonary nodule consistent with worsening metastases. 3. Stable nonspecific left upper lobe 5 mm pulmonary nodule. 4. No evidence of pulmonary embolus. 5. Stable 4 cm ascending thoracic aortic aneurysm. Recommend annual imaging followup by CTA or MRA. This recommendation follows 2010 ACCF/AHA/AATS/ACR/ASA/SCA/SCAI/SIR/STS/SVM Guidelines for the Diagnosis and Management of Patients with Thoracic Aortic Disease. Circulation. 2010; 121: I097-D532. Aortic aneurysm NOS (ICD10-I71.9) 6. Aortic Atherosclerosis (ICD10-I70.0) and Emphysema (ICD10-J43.9). Electronically  Signed   By: Randa Ngo M.D.   On: 07/30/2021 20:26     TODAY-DAY OF DISCHARGE:  Subjective:   [name] today has no headache,no chest abdominal pain,no new weakness tingling or numbness, feels much better wants to go home today.   Objective:   Blood pressure (!) 112/57, pulse 71, temperature 98.2 F (36.8 C), temperature source Oral, resp. rate 17, SpO2 96 %.  Intake/Output Summary (Last 24 hours) at 08/01/2021 0939 Last data filed at 08/01/2021 0851 Gross per 24 hour  Intake 230 ml  Output 1370 ml  Net -1140 ml   There were no vitals filed for this visit.  Exam: Awake Alert, Oriented *3, No new F.N deficits, Normal affect [place].AT,PERRAL Supple Neck,No JVD, No cervical lymphadenopathy appriciated.  Symmetrical Chest wall movement, Good air movement bilaterally, CTAB RRR,No Gallops,Rubs or new Murmurs, No Parasternal Heave +ve B.Sounds, Abd Soft, Non tender, No organomegaly appriciated, No rebound -guarding or rigidity. No Cyanosis, Clubbing or edema, No new Rash or bruise   PERTINENT RADIOLOGIC STUDIES: DG Chest 1 View  Result Date: 07/30/2021 CLINICAL DATA:  Cough, hemoptysis EXAM: CHEST  1 VIEW COMPARISON:  05/20/2021 FINDINGS: Bilateral interstitial thickening. Left perihilar band like area of airspace disease. No pleural effusion or pneumothorax. Stable cardiomediastinal silhouette. No aggressive osseous lesion. IMPRESSION: 1. Left perihilar airspace disease concerning for pneumonia versus post treatment changes. The pre see demonstrated mass in the superior segment of the left lower lobe  in this area is not well visualized. If there is further clinical concern, recommend a CT of the chest with intravenous contrast. Electronically Signed   By: Kathreen Devoid M.D.   On: 07/30/2021 13:54   DG Abd 1 View  Result Date: 07/30/2021 CLINICAL DATA:  Constipation. EXAM: ABDOMEN - 1 VIEW COMPARISON:  Abdominal x-ray 01/11/2012. FINDINGS: Bowel-gas pattern is nonobstructive. There  is a large amount of stool throughout the entire colon and within the rectum. Contrast is seen in the bladder. No suspicious calcifications are identified. There are no acute fractures. Lung bases are clear. IMPRESSION: 1. Nonobstructive bowel gas pattern. 2. Large amount of stool throughout the colon and within the rectum. Electronically Signed   By: Ronney Asters M.D.   On: 07/30/2021 22:11   CT HEAD WO CONTRAST (5MM)  Result Date: 07/30/2021 CLINICAL DATA:  History of CNS neoplasm, hemoptysis EXAM: CT HEAD WITHOUT CONTRAST TECHNIQUE: Contiguous axial images were obtained from the base of the skull through the vertex without intravenous contrast. RADIATION DOSE REDUCTION: This exam was performed according to the departmental dose-optimization program which includes automated exposure control, adjustment of the mA and/or kV according to patient size and/or use of iterative reconstruction technique. COMPARISON:  02/18/2021 CT, correlation is also made with MRI head 03/25/2021 FINDINGS: Brain: Prior right frontoparietal craniotomy for tumor resection. Overall unchanged hypodensity in the right frontoparietal and posterior temporal lobes compared to 02/18/2021. Evaluation for mass is limited in the absence of intravenous contrast. No acute hemorrhage or midline shift. No extra-axial collection or hydrocephalus. Vascular: No hyperdense vessel. Skull: Right frontoparietal craniotomy. No acute fracture or focal lesion. Sinuses/Orbits: The sinuses are clear. Prior left orbital inferior rim fracture. Other: The mastoids are well aerated. IMPRESSION: 1. No acute intracranial process. 2. Prior right frontoparietal craniotomy for tumor resection, with overall unchanged edema in the right frontal, parietal, and temporal lobes. Electronically Signed   By: Merilyn Baba M.D.   On: 07/30/2021 20:25   CT Angio Chest PE W and/or Wo Contrast  Result Date: 07/30/2021 CLINICAL DATA:  Cough, hemoptysis, history of metastatic lung  cancer EXAM: CT ANGIOGRAPHY CHEST WITH CONTRAST TECHNIQUE: Multidetector CT imaging of the chest was performed using the standard protocol during bolus administration of intravenous contrast. Multiplanar CT image reconstructions and MIPs were obtained to evaluate the vascular anatomy. RADIATION DOSE REDUCTION: This exam was performed according to the departmental dose-optimization program which includes automated exposure control, adjustment of the mA and/or kV according to patient size and/or use of iterative reconstruction technique. CONTRAST:  39mL OMNIPAQUE IOHEXOL 350 MG/ML SOLN COMPARISON:  03/12/2021, 07/30/2021 FINDINGS: Cardiovascular: This is a technically adequate evaluation of the pulmonary vasculature. No filling defects or pulmonary emboli. The heart is unremarkable without pericardial effusion. 4 cm ascending thoracic aortic aneurysm again noted. No evidence of dissection. Mild atherosclerosis. Mediastinum/Nodes: Subcentimeter pretracheal and AP window adenopathy unchanged. No pathologically enlarged lymph nodes. Thyroid, trachea, and esophagus are stable. Lungs/Pleura: The spiculated left perihilar mass within the superior segment left lower lobe now measures 4.9 x 4.1 cm reference image 51/7, previously having measured 3.6 x 3.2 cm. There is increasing surrounding nodular interlobular septal thickening concerning for lymphangitic spread of disease. Increasing opacification of the left lower lobe bronchus, with likely postobstructive changes within the basilar segments of the left lower lobe. Stable 5 mm left upper lobe nodule reference image 26/7. Stable emphysema. 1.2 x 1.0 cm right lower lobe nodule is seen in the right perihilar region image 66/7, increased since prior  study where this had measured 0.9 x 0.7 cm. No effusion or pneumothorax. Upper Abdomen: No acute abnormality. Musculoskeletal: Remote posttraumatic changes of the left lateral fifth and 6 ribs, stable. No acute bony abnormalities.  Reconstructed images demonstrate no additional findings. Review of the MIP images confirms the above findings. IMPRESSION: 1. Enlarging left lower lobe mass consistent with progressive lung cancer. Surrounding nodular interlobular septal thickening concerning for lymphangitic spread of disease. Scattered postobstructive changes within the basilar segments of the left lower lobe due to narrowing of the left lower lobe bronchus by the dominant superior segment left lower lobe mass. 2. Enlarging right lower lobe pulmonary nodule consistent with worsening metastases. 3. Stable nonspecific left upper lobe 5 mm pulmonary nodule. 4. No evidence of pulmonary embolus. 5. Stable 4 cm ascending thoracic aortic aneurysm. Recommend annual imaging followup by CTA or MRA. This recommendation follows 2010 ACCF/AHA/AATS/ACR/ASA/SCA/SCAI/SIR/STS/SVM Guidelines for the Diagnosis and Management of Patients with Thoracic Aortic Disease. Circulation. 2010; 121: H962-I297. Aortic aneurysm NOS (ICD10-I71.9) 6. Aortic Atherosclerosis (ICD10-I70.0) and Emphysema (ICD10-J43.9). Electronically Signed   By: Randa Ngo M.D.   On: 07/30/2021 20:26     PERTINENT LAB RESULTS: CBC: Recent Labs    07/30/21 1805 07/31/21 0422  WBC 6.9 7.4  HGB 9.2* 9.1*  HCT 32.4* 30.9*  PLT 260 241   CMET CMP     Component Value Date/Time   NA 142 07/31/2021 0422   NA 143 12/28/2013 0920   K 3.8 07/31/2021 0422   K 3.8 12/28/2013 0920   CL 105 07/31/2021 0422   CO2 28 07/31/2021 0422   CO2 28 12/28/2013 0920   GLUCOSE 96 07/31/2021 0422   GLUCOSE 148 (H) 12/28/2013 0920   BUN 18 07/31/2021 0422   BUN 14.5 12/28/2013 0920   CREATININE 1.14 (H) 07/31/2021 0422   CREATININE 1.17 (H) 06/06/2021 1107   CREATININE 1.4 (H) 12/28/2013 0920   CALCIUM 8.9 07/31/2021 0422   CALCIUM 9.2 12/28/2013 0920   PROT 8.1 07/31/2021 0422   PROT 7.8 07/31/2021 0422   PROT 7.9 12/28/2013 0920   ALBUMIN 3.3 (L) 07/31/2021 0422   ALBUMIN 3.3 (L)  07/31/2021 0422   ALBUMIN 3.8 12/28/2013 0920   AST 13 (L) 07/31/2021 0422   AST 12 (L) 07/31/2021 0422   AST 17 12/28/2013 0920   ALT 10 07/31/2021 0422   ALT 10 07/31/2021 0422   ALT 16 12/28/2013 0920   ALKPHOS 53 07/31/2021 0422   ALKPHOS 51 07/31/2021 0422   ALKPHOS 76 12/28/2013 0920   BILITOT 0.3 07/31/2021 0422   BILITOT 0.4 07/31/2021 0422   BILITOT 0.24 12/28/2013 0920   GFRNONAA 51 (L) 07/31/2021 0422   GFRNONAA 53 (L) 05/22/2020 1033   GFRNONAA 58 (L) 06/28/2019 1424   GFRAA >60 01/09/2020 2038   GFRAA >60 12/26/2019 0804   GFRAA 67 06/28/2019 1424    GFR CrCl cannot be calculated (Unknown ideal weight.). No results for input(s): LIPASE, AMYLASE in the last 72 hours. No results for input(s): CKTOTAL, CKMB, CKMBINDEX, TROPONINI in the last 72 hours. Invalid input(s): POCBNP No results for input(s): DDIMER in the last 72 hours. No results for input(s): HGBA1C in the last 72 hours. No results for input(s): CHOL, HDL, LDLCALC, TRIG, CHOLHDL, LDLDIRECT in the last 72 hours. Recent Labs    07/31/21 0422  TSH 1.948   Recent Labs    07/31/21 0422 07/31/21 0423 07/31/21 0424  VITAMINB12 487  --   --   FOLATE  --  17.0  --  FERRITIN 23  --   --   TIBC 385  --   --   IRON 42  --   --   RETICCTPCT  --   --  1.4   Coags: No results for input(s): INR in the last 72 hours.  Invalid input(s): PT Microbiology: Recent Results (from the past 240 hour(s))  Resp Panel by RT-PCR (Flu A&B, Covid) Nasopharyngeal Swab     Status: None   Collection Time: 07/30/21  3:13 PM   Specimen: Nasopharyngeal Swab; Nasopharyngeal(NP) swabs in vial transport medium  Result Value Ref Range Status   SARS Coronavirus 2 by RT PCR NEGATIVE NEGATIVE Final    Comment: (NOTE) SARS-CoV-2 target nucleic acids are NOT DETECTED.  The SARS-CoV-2 RNA is generally detectable in upper respiratory specimens during the acute phase of infection. The lowest concentration of SARS-CoV-2 viral copies  this assay can detect is 138 copies/mL. A negative result does not preclude SARS-Cov-2 infection and should not be used as the sole basis for treatment or other patient management decisions. A negative result may occur with  improper specimen collection/handling, submission of specimen other than nasopharyngeal swab, presence of viral mutation(s) within the areas targeted by this assay, and inadequate number of viral copies(<138 copies/mL). A negative result must be combined with clinical observations, patient history, and epidemiological information. The expected result is Negative.  Fact Sheet for Patients:  EntrepreneurPulse.com.au  Fact Sheet for Healthcare Providers:  IncredibleEmployment.be  This test is no t yet approved or cleared by the [place] FDA and  has been authorized for detection and/or diagnosis of SARS-CoV-2 by FDA under an Emergency Use Authorization (EUA). This EUA will remain  in effect (meaning this test can be used) for the duration of the COVID-19 declaration under Section 564(b)(1) of the Act, 21 U.S.C.section 360bbb-3(b)(1), unless the authorization is terminated  or revoked sooner.       Influenza A by PCR NEGATIVE NEGATIVE Final   Influenza B by PCR NEGATIVE NEGATIVE Final    Comment: (NOTE) The Xpert Xpress SARS-CoV-2/FLU/RSV plus assay is intended as an aid in the diagnosis of influenza from Nasopharyngeal swab specimens and should not be used as a sole basis for treatment. Nasal washings and aspirates are unacceptable for Xpert Xpress SARS-CoV-2/FLU/RSV testing.  Fact Sheet for Patients: EntrepreneurPulse.com.au  Fact Sheet for Healthcare Providers: IncredibleEmployment.be  This test is not yet approved or cleared by the [place] FDA and has been authorized for detection and/or diagnosis of SARS-CoV-2 by FDA under an Emergency Use Authorization (EUA). This EUA  will remain in effect (meaning this test can be used) for the duration of the COVID-19 declaration under Section 564(b)(1) of the Act, 21 U.S.C. section 360bbb-3(b)(1), unless the authorization is terminated or revoked.  Performed at [facility], [place] [address], [place], [place] [address]     FURTHER DISCHARGE INSTRUCTIONS:  Get Medicines reviewed and adjusted: Please take all your medications with you for your next visit with your Primary MD  Laboratory/radiological data: Please request your Primary MD to go over all hospital tests and procedure/radiological results at the follow up, please ask your Primary MD to get all Hospital records sent to his/her office.  In some cases, they will be blood work, cultures and biopsy results pending at the time of your discharge. Please request that your primary care M.D. goes through all the records of your hospital data and follows up on these results.  Also Note the following: If you experience worsening  of your admission symptoms, develop shortness of breath, life threatening emergency, suicidal or homicidal thoughts you must seek medical attention immediately by calling 911 or calling your MD immediately  if symptoms less severe.  You must read complete instructions/literature along with all the possible adverse reactions/side effects for all the Medicines you take and that have been prescribed to you. Take any new Medicines after you have completely understood and accpet all the possible adverse reactions/side effects.   Do not drive when taking Pain medications or sleeping medications (Benzodaizepines)  Do not take more than prescribed Pain, Sleep and Anxiety Medications. It is not advisable to combine anxiety,sleep and pain medications without talking with your primary care practitioner  Special Instructions: If you have smoked or chewed Tobacco  in the last 2 yrs please stop smoking, stop any regular Alcohol  and or any Recreational  drug use.  Wear Seat belts while driving.  Please note: You were cared for by a hospitalist during your hospital stay. Once you are discharged, your primary care physician will handle any further medical issues. Please note that NO REFILLS for any discharge medications will be authorized once you are discharged, as it is imperative that you return to your primary care physician (or establish a relationship with a primary care physician if you do not have one) for your post hospital discharge needs so that they can reassess your need for medications and monitor your lab values.  Total Time spent coordinating discharge including counseling, education and face to face time equals greater than 30 minutes.  SignedOren Binet 08/01/2021 9:39 AM

## 2021-08-01 NOTE — Assessment & Plan Note (Signed)
DNR in place-evaluated by palliative care again this hospitalization-patient to resume hospice follow-up on discharge.  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  See above discussion regarding stopping anticoagulation-patient aware that we will allow nature to take its course at this point.

## 2021-08-01 NOTE — NC FL2 (Signed)
[place] LEVEL OF CARE SCREENING TOOL     IDENTIFICATION  Patient Name: [name] Birthdate: 1946-01-15 Sex: female Admission Date (Current Location): 07/30/2021  [facility] and [place] Number:  Herbalist and Address:  [facility], [place] [address], [place], [place] [address]      Provider Number: [id number]  Attending Physician Name and Address:  Jonetta Osgood, MD  Relative Name and Phone Number:       Current Level of Care: Hospital Recommended Level of Care: [place] Prior Approval Number:    Date Approved/Denied:   PASRR Number: [id number]  Discharge Plan: SNF    Current Diagnoses: Patient Active Problem List   Diagnosis Date Noted   Palliative care encounter 08/01/2021   Constipation 07/31/2021   Hemoptysis 07/30/2021   Left lateral abdominal pain 07/30/2021   History of pulmonary embolus (PE) 07/30/2021   Chronic respiratory failure with hypoxia-on 3 L of oxygen at home 01/07/2021   Brain metastasis ([place]) 05/22/2020   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   Brain mass 01/02/2020   OSA (obstructive sleep apnea) 01/02/2020   Mood disorder: 12/06/2019   Cerebral edema (HCC)    Encephalopathy    HIV disease ([place])    OSA (obstructive sleep apnea)    Dyslipidemia    History of CVA with residual deficit    Brain tumor ([place]) 11/28/2019   Change in mental status 11/21/2019   Bed bug bite 07/12/2019   DNR (do not resuscitate)    Altered mental status    Acute pulmonary embolism without acute cor pulmonale ([place])    Acute ischemic right MCA stroke ([place])    Palliative care status    Aortic embolism or thrombosis ([place])    COPD (chronic obstructive pulmonary disease) ([place]) 03/01/2019   PE (pulmonary thromboembolism) ([place]) 03/01/2019   Tobacco abuse 03/01/2019   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[place]) 03/01/2019   CK stage IIIa 03/01/2019   Generalized weakness 03/01/2019   Fall at home    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[facility])    Acute  and chronic respiratory failure (acute-on-chronic) ([place]) 02/11/2019   CKD (chronic kidney disease) stage 3, GFR 30-59 ml/min ([place]) 08/12/2016   Lung nodule 10/12/2015   Bronchitis 09/09/2015   History of lung cancer 09/08/2015   Thoracic aorta atherosclerosis ([place]) 09/08/2015   Chronic obstructive pulmonary disease with (acute) exacerbation ([place]) 09/08/2015   Sepsis ([place]) 03/28/2015   CAP (community acquired pneumonia) 03/28/2015   Lower urinary tract infectious disease 03/28/2015   Respiratory failure ([place]) 03/28/2015   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    Malignant neoplasm of upper lobe, bronchus or lung 01/03/2014   Acute hypoxemic respiratory failure ([place]) 10/31/2013   COPD with acute exacerbation ([place]) 10/30/2013   COPD exacerbation ([place]) 10/30/2013   Other and unspecified noninfectious gastroenteritis and colitis(558.9) 11/20/2012   Rectal bleed 11/19/2012   Environmental allergies 11/02/2012   Obesity 05/05/2012   Renal insufficiency 05/05/2012   Hyperglycemia 05/05/2012   Asthma 12/10/2011   Cigarette smoker 12/10/2011   Hypercholesteremia 12/11/2010   Hypertension 12/11/2010   ALLERGIC RHINITIS, SEASONAL 07/03/2010   COPD mixed type ([place]) 07/03/2010   HIV (human immunodeficiency virus infection) ([place]) 12/09/2006   DEPRESSION 12/09/2006   BREAST MASS, BENIGN 12/09/2006   OSTEOPOROSIS 12/09/2006    Orientation RESPIRATION BLADDER Height & Weight     Self, Time, Situation, Place  O2 (Nasal cannula 2L) Incontinent, External catheter Weight:   Height:     BEHAVIORAL SYMPTOMS/MOOD  NEUROLOGICAL BOWEL NUTRITION STATUS      Incontinent Diet (see dc summary)  AMBULATORY STATUS COMMUNICATION OF NEEDS Skin   Extensive Assist Verbally Normal                       Personal Care Assistance Level of Assistance  Bathing, Feeding, Dressing, Total care Bathing Assistance: Maximum assistance Feeding assistance: Limited assistance Dressing Assistance:  Limited assistance Total Care Assistance: Maximum assistance   Functional Limitations Info             SPECIAL CARE FACTORS FREQUENCY                       Contractures Contractures Info: Not present    Additional Factors Info  Code Status, Allergies Code Status Info: DNR Allergies Info: NKA           Current Medications (08/01/2021):  This is the current hospital active medication list Current Facility-Administered Medications  Medication Dose Route Frequency Provider Last Rate Last Admin   acetaminophen (TYLENOL) tablet 650 mg  650 mg Oral Q6H PRN Toy Baker, MD       Or   acetaminophen (TYLENOL) suppository 650 mg  650 mg Rectal Q6H PRN Doutova, Anastassia, MD       albuterol (PROVENTIL) (2.5 MG/3ML) 0.083% nebulizer solution 2.5 mg  2.5 mg Nebulization Q2H PRN Doutova, Anastassia, MD       bictegravir-emtricitabine-tenofovir AF (BIKTARVY) 50-200-25 MG per tablet 1 tablet  1 tablet Oral Daily Doutova, Anastassia, MD   1 tablet at 08/01/21 0935   bisacodyl (DULCOLAX) suppository 10 mg  10 mg Rectal Daily PRN Jonetta Osgood, MD       busPIRone (BUSPAR) tablet 2.5 mg  2.5 mg Oral TID Toy Baker, MD   2.5 mg at 08/01/21 0914   guaiFENesin (MUCINEX) 12 hr tablet 600 mg  600 mg Oral BID Toy Baker, MD   600 mg at 08/01/21 0914   HYDROcodone-acetaminophen (NORCO/VICODIN) 5-325 MG per tablet 1-2 tablet  1-2 tablet Oral Q4H PRN Toy Baker, MD   2 tablet at 07/31/21 2126   methocarbamol (ROBAXIN) 500 mg in dextrose 5 % 50 mL IVPB  500 mg Intravenous Q6H PRN Doutova, Anastassia, MD 100 mL/hr at 08/01/21 0126 500 mg at 08/01/21 0126   mometasone-formoterol (DULERA) 200-5 MCG/ACT inhaler 2 puff  2 puff Inhalation BID Toy Baker, MD   2 puff at 08/01/21 0907   morphine (PF) 2 MG/ML injection 2-4 mg  2-4 mg Intravenous Q4H PRN Doutova, Anastassia, MD       polyethylene glycol (MIRALAX / GLYCOLAX) packet 17 g  17 g Oral BID Jonetta Osgood, MD   17 g at 08/01/21 0915   QUEtiapine (SEROQUEL XR) 24 hr tablet 150 mg  150 mg Oral QHS Doutova, Anastassia, MD   150 mg at 07/31/21 2127   rOPINIRole (REQUIP) tablet 1 mg  1 mg Oral QHS Doutova, Anastassia, MD   1 mg at 07/31/21 2127   senna-docusate (Senokot-S) tablet 2 tablet  2 tablet Oral QHS Ghimire, Henreitta Leber, MD       simethicone (MYLICON) chewable tablet 80 mg  80 mg Oral QID PRN Doutova, Anastassia, MD       umeclidinium bromide (INCRUSE ELLIPTA) 62.5 MCG/ACT 1 puff  1 puff Inhalation Daily Doutova, Anastassia, MD   1 puff at 08/01/21 0936   venlafaxine XR (EFFEXOR-XR) 24 hr capsule 150 mg  150 mg Oral Daily Doutova, Anastassia,  MD   150 mg at 08/01/21 9532     Discharge Medications: Please see discharge summary for a list of discharge medications.  Relevant Imaging Results:  Relevant Lab Results:   Additional Information ss#[id number]. Hosp[facility] to [facility] at [facility], LCSW

## 2021-08-01 NOTE — TOC Initial Note (Addendum)
Transition of Care [facility]) - Initial/Assessment Note    Patient Details  Name: [name] MRN: [id number] Date of Birth: 29-Oct-1945  Transition of Care [facility]) CM/SW Contact:    Benard Halsted, LCSW Phone Number: 08/01/2021, 10:17 AM  Clinical Narrative:                 CSW spoke with patient's brother, BJ, and confirmed plan for patient to discharge back to [facility] today. Referral was made to [facility] and they will follow at facility. CSW spoke with [facility] liaison and confirmed discharge. WIll arrange PTAR for transport.   CSW spoke with patient (and brother on speaker phone) and explained discharge plan. Patient reported she wanted to discharge home but brother explained to her that her previous residence may be rented to someone else now but that he will follow up once she is back at [facility].   Expected Discharge Plan: Skilled Nursing Facility Barriers to Discharge: Barriers Resolved   Patient Goals and CMS Choice Patient states their goals for this hospitalization and ongoing recovery are:: Stop coughing so much CMS Medicare.gov Compare Post Acute Care list provided to:: Patient Choice offered to / list presented to : Sibling, Patient  Expected Discharge Plan and Services Expected Discharge Plan: [place] In-house Referral: Clinical Social Work, Hospice / [place] Acute Care Choice: [place] arrangements for the past 2 months: [place] Expected Discharge Date: 08/01/21                                    Prior Living Arrangements/Services Living arrangements for the past 2 months: [place] Lives with:: Facility Resident Patient language and need for interpreter reviewed:: Yes Do you feel safe going back to the place where you live?: Yes      Need for Family Participation in Patient Care: Yes (Comment)   Current home services: DME Criminal  Activity/Legal Involvement Pertinent to Current Situation/Hospitalization: No - Comment as needed  Activities of Daily Living      Permission Sought/Granted Permission sought to share information with : Facility Sport and exercise psychologist, Family Supports Permission granted to share information with : Yes, Verbal Permission Granted  Share Information with NAME: BJ  Permission granted to share info w AGENCY: [facility]  Permission granted to share info w Relationship: Brother     Emotional Assessment Appearance:: Appears stated age Attitude/Demeanor/Rapport: Engaged Affect (typically observed): Accepting, Appropriate Orientation: : Oriented to Self, Oriented to Place, Oriented to  Time, Oriented to Situation Alcohol / Substance Use: Not Applicable Psych Involvement: No (comment)  Admission diagnosis:  Cough with hemoptysis [R04.2] Constipation [K59.00] Hemoptysis [R04.2] Primary malignant neoplasm of left lung metastatic to other site [facility]) [C34.92] Patient Active Problem List   Diagnosis Date Noted   Palliative care encounter 08/01/2021   Constipation 07/31/2021   Hemoptysis 07/30/2021   Left lateral abdominal pain 07/30/2021   History of pulmonary embolus (PE) 07/30/2021   Chronic respiratory failure with hypoxia-on 3 L of oxygen at home 01/07/2021   Brain metastasis ([place]) 05/22/2020   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   Brain mass 01/02/2020   OSA (obstructive sleep apnea) 01/02/2020   Mood disorder: 12/06/2019   Cerebral edema (HCC)    Encephalopathy    HIV disease (HCC)    OSA (obstructive sleep apnea)    Dyslipidemia    History of CVA with residual deficit  Brain tumor ([place]) 11/28/2019   Change in mental status 11/21/2019   Bed bug bite 07/12/2019   DNR (do not resuscitate)    Altered mental status    Acute pulmonary embolism without acute cor pulmonale (HCC)    Acute ischemic right MCA stroke [facility])    Palliative care status    Aortic embolism or thrombosis ([place])    COPD  (chronic obstructive pulmonary disease) ([place]) 03/01/2019   PE (pulmonary thromboembolism) ([place]) 03/01/2019   Tobacco abuse 03/01/2019   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[place]) 03/01/2019   CK stage IIIa 03/01/2019   Generalized weakness 03/01/2019   Fall at home    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[facility])    Acute and chronic respiratory failure (acute-on-chronic) ([place]) 02/11/2019   CKD (chronic kidney disease) stage 3, GFR 30-59 ml/min ([place]) 08/12/2016   Lung nodule 10/12/2015   Bronchitis 09/09/2015   History of lung cancer 09/08/2015   Thoracic aorta atherosclerosis ([place]) 09/08/2015   Chronic obstructive pulmonary disease with (acute) exacerbation ([place]) 09/08/2015   Sepsis ([place]) 03/28/2015   CAP (community acquired pneumonia) 03/28/2015   Lower urinary tract infectious disease 03/28/2015   Respiratory failure ([place]) 03/28/2015   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    Malignant neoplasm of upper lobe, bronchus or lung 01/03/2014   Acute hypoxemic respiratory failure ([place]) 10/31/2013   COPD with acute exacerbation ([place]) 10/30/2013   COPD exacerbation ([place]) 10/30/2013   Other and unspecified noninfectious gastroenteritis and colitis(558.9) 11/20/2012   Rectal bleed 11/19/2012   Environmental allergies 11/02/2012   Obesity 05/05/2012   Renal insufficiency 05/05/2012   Hyperglycemia 05/05/2012   Asthma 12/10/2011   Cigarette smoker 12/10/2011   Hypercholesteremia 12/11/2010   Hypertension 12/11/2010   ALLERGIC RHINITIS, SEASONAL 07/03/2010   COPD mixed type ([place]) 07/03/2010   HIV (human immunodeficiency virus infection) ([place]) 12/09/2006   DEPRESSION 12/09/2006   BREAST MASS, BENIGN 12/09/2006   OSTEOPOROSIS 12/09/2006   PCP:  Nolene Ebbs, MD Pharmacy:   [facility] [place] [place] [address] Phone: [phone] Fax: [phone]     Social Determinants of Health (SDOH) Interventions    Readmission Risk Interventions No  flowsheet data found.

## 2021-08-07 ENCOUNTER — Inpatient Hospital Stay: Payer: Medicare HMO | Admitting: Internal Medicine

## 2021-10-07 ENCOUNTER — Ambulatory Visit (INDEPENDENT_AMBULATORY_CARE_PROVIDER_SITE_OTHER): Admitting: Infectious Disease

## 2021-10-07 ENCOUNTER — Other Ambulatory Visit: Payer: Self-pay

## 2021-10-07 ENCOUNTER — Encounter: Payer: Self-pay | Admitting: Infectious Disease

## 2021-10-07 VITALS — BP 96/62 | HR 70 | Temp 97.8°F

## 2021-10-07 DIAGNOSIS — B20 Human immunodeficiency virus [HIV] disease: Secondary | ICD-10-CM

## 2021-10-07 DIAGNOSIS — S0100XA Unspecified open wound of scalp, initial encounter: Secondary | ICD-10-CM

## 2021-10-07 DIAGNOSIS — C3492 Malignant neoplasm of unspecified part of left bronchus or lung: Secondary | ICD-10-CM | POA: Diagnosis not present

## 2021-10-07 DIAGNOSIS — Z7189 Other specified counseling: Secondary | ICD-10-CM | POA: Insufficient documentation

## 2021-10-07 DIAGNOSIS — N1832 Chronic kidney disease, stage 3b: Secondary | ICD-10-CM | POA: Diagnosis not present

## 2021-10-07 DIAGNOSIS — S0101XA Laceration without foreign body of scalp, initial encounter: Secondary | ICD-10-CM

## 2021-10-07 DIAGNOSIS — I2699 Other pulmonary embolism without acute cor pulmonale: Secondary | ICD-10-CM | POA: Diagnosis not present

## 2021-10-07 HISTORY — DX: Other specified counseling: Z71.89

## 2021-10-07 HISTORY — DX: Unspecified open wound of scalp, initial encounter: S01.00XA

## 2021-10-07 MED ORDER — BIKTARVY 50-200-25 MG PO TABS: 1.0000 | ORAL_TABLET | Freq: Every day | ORAL | 11 refills | Status: DC

## 2021-10-07 NOTE — Progress Notes (Signed)
? ?Subjective:  ?Chief complaint: Frequent urination also complaining of a wound on her scalp ? ? ? patient ID: [name], female    DOB: 1945/08/28, 76 y.o.   MRN: [id number] ? ?HPI ? ?[name] -year-old African-American lady living with HIV that I previously took care of at our [facility] up to 2015.  The time she been well controlled on Atripla.  She separately moved to [facility] and was under the care of Dr. Wyline Mood and there. ? 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 ? ?She has known comorbid hypertension COPD stage III chronic kidney disease obstructive sleep apnea tobacco abuse and prior adenocarcinoma of the left upper lung. ? 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  She had undergone craniotomy and resection of right high frontal metastases in 2021 and completed postoperative SRS  with Dr. Lisbeth Renshaw. ? ?She has been recently bedbound and not felt to be a good candidate for aggressive care at this point in time. ? ?He was recently admitted in February with hemoptysis thought to be due to her malignancy in the context also of anticoagulation. ? ?She was seen by palliative care and decisions were made for DNR status as documented in her paperwork. ? ?She is concerned about a wound on her scalp which I have examined in which currently is not purulent but is some scabs on I expect that was postsurgical. ? ? ? ? ?Past Medical History:  ?Diagnosis Date  ? Acute cystitis without hematuria   ? Acute encephalopathy 02/18/2021  ? Asthma   ? Atypical squamous cells of undetermined significance (ASCUS) on Papanicolaou smear of cervix   ? Bed bug bite 07/12/2019  ? Cancer [facility])   ? COPD (chronic obstructive pulmonary disease) ([place])   ? CVA (cerebral vascular  accident) ([name]) 03/02/2019  ? H/O drainage of abscess   ? left axilla, from left breast  ? Hep B w/o coma   ? HIV (human immunodeficiency virus infection) ([name])   ? HIV (human immunodeficiency virus infection) ([name]) 01/02/2020  ? Hyperlipidemia   ? Lung cancer ([name]) 12/14/13  ? LUL Adenocarcinoma  ? Neuromuscular disorder ([name])   ? fingers/feet neuropathy  ? Non-small cell lung cancer ([place])   ? Parasite infection 06/06/2021  ? Parasite infection 06/06/2021  ? Renal disorder   ? S/P radiation therapy 01/26/14-02/02/14  ? sbrt  lt upper lung-54Gy/21fx  ? Splenic infarct   ? Stroke [facility])   ? ? ?Past Surgical History:  ?Procedure Laterality Date  ? ABCESS DRAINAGE    ? abcess under Left arm, abcess from L breast  ? APPLICATION OF CRANIAL NAVIGATION Right 11/28/2019  ? Procedure: APPLICATION OF CRANIAL NAVIGATION;  Surgeon: [name], MD;  Location: [facility];  Service: Neurosurgery;  Laterality: Right;  ? BRAIN SURGERY    ? BUBBLE STUDY  03/08/2019  ? Procedure: BUBBLE STUDY;  Surgeon: [name], MD;  Location: [place];  Service: Cardiovascular;;  ? CRANIOTOMY Right 11/28/2019  ? Procedure: Right Craniotomy for Tumor Resection;  Surgeon: [name], MD;  Location: [place];  Service: Neurosurgery;  Laterality: Right;  ? CRANIOTOMY    ? removal of abnormal cells on uterus    ? TEE WITHOUT CARDIOVERSION N/A 03/08/2019  ?  Procedure: TRANSESOPHAGEAL ECHOCARDIOGRAM (TEE);  Surgeon: [name], MD;  Location: [place];  Service: Cardiovascular;  Laterality: N/A;  ? thyroid gland removed    ? ? ?Family History  ?Problem Relation Age of Onset  ? Pneumonia Mother   ? Hypertension Sister   ? Diabetes Sister   ? Cancer Sister   ?     pancreatic and lung  ? Cancer Brother   ?     pancreatic  ? Cancer Brother   ?     pancreatic  ? ? ?  ?Social History  ? ?Socioeconomic History  ? Marital status: Single  ?  Spouse name: Not on file  ? Number of children: Not on file  ? Years of education: Not on file  ? Highest education  level: Not on file  ?Occupational History  ? Not on file  ?Tobacco Use  ? Smoking status: Never  ? Smokeless tobacco: Never  ? Tobacco comments:  ?  quit date the day she starts her radiation at the [facility]  ?Substance and Sexual Activity  ? Alcohol use: Not Currently  ?  Comment: none in 20 years  ? Drug use: Not Currently  ?  Comment: none in 20 years  ? Sexual activity: Not Currently  ?  Partners: Male  ?  Comment: given condoms  ?Other Topics Concern  ? Not on file  ?Social History Narrative  ? ** Merged History Encounter **  ?    ? ?Social Determinants of Health  ? ?Financial Resource Strain: Not on file  ?Food Insecurity: Not on file  ?Transportation Needs: Not on file  ?Physical Activity: Not on file  ?Stress: Not on file  ?Social Connections: Not on file  ? ? ?No Known Allergies ? ? ?Current Outpatient Medications:  ?  albuterol (VENTOLIN HFA) 108 (90 Base) MCG/ACT inhaler, Inhale 1 puff into the lungs every 6 (six) hours as needed for shortness of breath. , Disp: , Rfl:  ?  ammonium lactate (LAC-HYDRIN) 12 % lotion, Apply 1 application topically in the morning and at bedtime., Disp: , Rfl:  ?  apixaban (ELIQUIS) 5 MG TABS tablet, Take 1 tablet (5 mg total) by mouth 2 (two) times daily., Disp: 60 tablet, Rfl: 1 ?  bictegravir-emtricitabine-tenofovir AF (BIKTARVY) 50-200-25 MG TABS tablet, Take 1 tablet by mouth daily., Disp: 30 tablet, Rfl: 11 ?  bisacodyl (DULCOLAX) 10 MG suppository, Place 1 suppository (10 mg total) rectally as needed for moderate constipation., Disp: 12 suppository, Rfl: 0 ?  busPIRone (BUSPAR) 5 MG tablet, Take 2.5 mg by mouth 3 (three) times daily., Disp: , Rfl:  ?  carboxymethylcellulose (ARTIFICIAL TEARS) 1 % ophthalmic solution, Place 1 drop into both eyes every 12 (twelve) hours., Disp: , Rfl:  ?  Fluticasone-Salmeterol (ADVAIR) 500-50 MCG/DOSE AEPB, Inhale 1 puff into the lungs every 12 (twelve) hours., Disp: , Rfl:  ?  gabapentin (NEURONTIN) 800 MG tablet, Take 800 mg by  mouth 3 (three) times daily., Disp: , Rfl:  ?  Melatonin 3 MG CAPS, Take 3 mg by mouth at bedtime., Disp: , Rfl:  ?  montelukast (SINGULAIR) 10 MG tablet, Take 10 mg by mouth at bedtime. , Disp: , Rfl:  ?  oxycodone (OXY-IR) 5 MG capsule, Take 1 capsule (5 mg total) by mouth 3 (three) times daily., Disp: 30 capsule, Rfl: 0 ?  OXYGEN, Inhale 3 L into the lungs continuous., Disp: , Rfl:  ?  polyethylene glycol (MIRALAX / GLYCOLAX) 17 g packet, Take  17 g by mouth 2 (two) times daily. Mix in 4-8 oz liquid and drink, Disp: 14 each, Rfl: 2 ?  QUEtiapine Fumarate (SEROQUEL XR) 150 MG 24 hr tablet, Take 150 mg by mouth at bedtime., Disp: , Rfl:  ?  rOPINIRole (REQUIP) 1 MG tablet, Take 1 tablet (1 mg total) by mouth at bedtime., Disp: 30 tablet, Rfl: 1 ?  sennosides-docusate sodium (SENOKOT-S) 8.6-50 MG tablet, Take 2 tablets by mouth at bedtime., Disp: , Rfl:  ?  simethicone (MYLICON) 757 MG chewable tablet, Chew 125 mg by mouth in the morning and at bedtime. May also chew an additional 125mg  every 6 hours as needed for bloating/gas, Disp: , Rfl:  ?  Skin Protectants, Misc. (EUCERIN) cream, Apply 1 application topically daily. To entire body, Disp: , Rfl:  ?  SPIRIVA RESPIMAT 2.5 MCG/ACT AERS, INHALE 2 PUFFS INTO THE LUNGS DAILY (Patient taking differently: Inhale 2 puffs into the lungs daily.), Disp: 1 Inhaler, Rfl: 0 ?  tiZANidine (ZANAFLEX) 4 MG tablet, Take 4 mg by mouth every 12 (twelve) hours as needed (pain)., Disp: , Rfl:  ?  venlafaxine XR (EFFEXOR-XR) 150 MG 24 hr capsule, Take 150 mg by mouth daily., Disp: , Rfl:  ?  vitamin C (ASCORBIC ACID) 500 MG tablet, Take 500 mg by mouth daily., Disp: , Rfl:  ? ? ?Review of Systems  ?Unable to perform ROS: Psychiatric disorder  ?Constitutional:  Negative for activity change, appetite change, chills, diaphoresis, fatigue, fever and unexpected weight change.  ?HENT:  Negative for congestion, rhinorrhea, sinus pressure, sneezing, sore throat and trouble swallowing.   ?Eyes:   Negative for photophobia and visual disturbance.  ?Respiratory:  Negative for cough, chest tightness, shortness of breath, wheezing and stridor.   ?Cardiovascular:  Negative for chest pain, palpitations

## 2021-10-08 LAB — T-HELPER CELLS (CD4) COUNT (NOT AT ARMC)
CD4 % Helper T Cell: 29 % — ABNORMAL LOW (ref 33–65)
CD4 T Cell Abs: 739 /uL (ref 400–1790)

## 2021-10-11 LAB — CBC WITH DIFFERENTIAL/PLATELET
Absolute Monocytes: 554 cells/uL (ref 200–950)
Basophils Absolute: 34 cells/uL (ref 0–200)
Basophils Relative: 0.4 %
Eosinophils Absolute: 168 cells/uL (ref 15–500)
Eosinophils Relative: 2 %
HCT: 34 % — ABNORMAL LOW (ref 35.0–45.0)
Hemoglobin: 10.2 g/dL — ABNORMAL LOW (ref 11.7–15.5)
Lymphs Abs: 2629 cells/uL (ref 850–3900)
MCH: 24.7 pg — ABNORMAL LOW (ref 27.0–33.0)
MCHC: 30 g/dL — ABNORMAL LOW (ref 32.0–36.0)
MCV: 82.3 fL (ref 80.0–100.0)
MPV: 9.5 fL (ref 7.5–12.5)
Monocytes Relative: 6.6 %
Neutro Abs: 5015 cells/uL (ref 1500–7800)
Neutrophils Relative %: 59.7 %
Platelets: 297 10*3/uL (ref 140–400)
RBC: 4.13 10*6/uL (ref 3.80–5.10)
RDW: 16 % — ABNORMAL HIGH (ref 11.0–15.0)
Total Lymphocyte: 31.3 %
WBC: 8.4 10*3/uL (ref 3.8–10.8)

## 2021-10-11 LAB — COMPLETE METABOLIC PANEL WITH GFR
AG Ratio: 0.9 (calc) — ABNORMAL LOW (ref 1.0–2.5)
ALT: 9 U/L (ref 6–29)
AST: 15 U/L (ref 10–35)
Albumin: 3.9 g/dL (ref 3.6–5.1)
Alkaline phosphatase (APISO): 64 U/L (ref 37–153)
BUN/Creatinine Ratio: 10 (calc) (ref 6–22)
BUN: 13 mg/dL (ref 7–25)
CO2: 29 mmol/L (ref 20–32)
Calcium: 9 mg/dL (ref 8.6–10.4)
Chloride: 102 mmol/L (ref 98–110)
Creat: 1.26 mg/dL — ABNORMAL HIGH (ref 0.60–1.00)
Globulin: 4.3 g/dL (calc) — ABNORMAL HIGH (ref 1.9–3.7)
Glucose, Bld: 105 mg/dL — ABNORMAL HIGH (ref 65–99)
Potassium: 4.3 mmol/L (ref 3.5–5.3)
Sodium: 140 mmol/L (ref 135–146)
Total Bilirubin: 0.3 mg/dL (ref 0.2–1.2)
Total Protein: 8.2 g/dL — ABNORMAL HIGH (ref 6.1–8.1)
eGFR: 45 mL/min/{1.73_m2} — ABNORMAL LOW (ref >=60)

## 2021-10-11 LAB — RPR: RPR Ser Ql: NONREACTIVE

## 2021-10-11 LAB — HIV-1 RNA QUANT-NO REFLEX-BLD
HIV 1 RNA Quant: <20 copies/mL — AB
HIV-1 RNA Quant, Log: <1.3 Log copies/mL — AB

## 2021-12-16 ENCOUNTER — Ambulatory Visit: Payer: Medicare HMO | Admitting: Infectious Disease

## 2022-02-09 ENCOUNTER — Ambulatory Visit: Payer: Medicare HMO | Admitting: Infectious Disease

## 2022-02-27 DEATH — deceased
